# Patient Record
Sex: Female | Born: 1980
Health system: Southern US, Community
[De-identification: ages and names within clinical notes are randomized; demographics above are authoritative.]

## PROBLEM LIST (undated history)

## (undated) DIAGNOSIS — R109 Unspecified abdominal pain: Secondary | ICD-10-CM

## (undated) DIAGNOSIS — K219 Gastro-esophageal reflux disease without esophagitis: Secondary | ICD-10-CM

## (undated) DIAGNOSIS — M549 Dorsalgia, unspecified: Secondary | ICD-10-CM

## (undated) DIAGNOSIS — F17200 Nicotine dependence, unspecified, uncomplicated: Secondary | ICD-10-CM

## (undated) DIAGNOSIS — L659 Nonscarring hair loss, unspecified: Secondary | ICD-10-CM

## (undated) DIAGNOSIS — R112 Nausea with vomiting, unspecified: Secondary | ICD-10-CM

## (undated) DIAGNOSIS — K279 Peptic ulcer, site unspecified, unspecified as acute or chronic, without hemorrhage or perforation: Secondary | ICD-10-CM

## (undated) DIAGNOSIS — G8929 Other chronic pain: Secondary | ICD-10-CM

## (undated) DIAGNOSIS — G43909 Migraine, unspecified, not intractable, without status migrainosus: Secondary | ICD-10-CM

## (undated) DIAGNOSIS — I10 Essential (primary) hypertension: Secondary | ICD-10-CM

## (undated) DIAGNOSIS — J302 Other seasonal allergic rhinitis: Secondary | ICD-10-CM

## (undated) DIAGNOSIS — K313 Pylorospasm, not elsewhere classified: Secondary | ICD-10-CM

## (undated) DIAGNOSIS — L729 Follicular cyst of the skin and subcutaneous tissue, unspecified: Secondary | ICD-10-CM

## (undated) DIAGNOSIS — J329 Chronic sinusitis, unspecified: Secondary | ICD-10-CM

## (undated) DIAGNOSIS — F1910 Other psychoactive substance abuse, uncomplicated: Secondary | ICD-10-CM

## (undated) DIAGNOSIS — K2970 Gastritis, unspecified, without bleeding: Secondary | ICD-10-CM

## (undated) DIAGNOSIS — J40 Bronchitis, not specified as acute or chronic: Secondary | ICD-10-CM

## (undated) DIAGNOSIS — K3184 Gastroparesis: Secondary | ICD-10-CM

## (undated) DIAGNOSIS — K311 Adult hypertrophic pyloric stenosis: Secondary | ICD-10-CM

## (undated) DIAGNOSIS — D638 Anemia in other chronic diseases classified elsewhere: Secondary | ICD-10-CM

## (undated) DIAGNOSIS — F329 Major depressive disorder, single episode, unspecified: Secondary | ICD-10-CM

## (undated) DIAGNOSIS — R87629 Unspecified abnormal cytological findings in specimens from vagina: Secondary | ICD-10-CM

## (undated) DIAGNOSIS — J449 Chronic obstructive pulmonary disease, unspecified: Secondary | ICD-10-CM

## (undated) HISTORY — PX: CHOLECYSTECTOMY: SHX55

## (undated) HISTORY — DX: Nonscarring hair loss, unspecified: L65.9

## (undated) HISTORY — PX: OTHER SURGICAL HISTORY: SHX169

## (undated) HISTORY — DX: Bronchitis, not specified as acute or chronic: J40

## (undated) HISTORY — PX: CARPAL TUNNEL RELEASE: SHX101

## (undated) HISTORY — DX: Migraine, unspecified, not intractable, without status migrainosus: G43.909

## (undated) HISTORY — DX: Gastro-esophageal reflux disease without esophagitis: K21.9

## (undated) HISTORY — DX: Unspecified abnormal cytological findings in specimens from vagina: R87.629

## (undated) HISTORY — DX: Gastroparesis: K31.84

## (undated) HISTORY — DX: Adult hypertrophic pyloric stenosis: K31.1

## (undated) HISTORY — DX: Follicular cyst of the skin and subcutaneous tissue, unspecified: L72.9

## (undated) HISTORY — PX: TOE SURGERY: SHX1073

## (undated) HISTORY — DX: Other chronic pain: G89.29

## (undated) HISTORY — DX: Dorsalgia, unspecified: M54.9

## (undated) HISTORY — DX: Other psychoactive substance abuse, uncomplicated: F19.10

## (undated) HISTORY — DX: Nicotine dependence, unspecified, uncomplicated: F17.200

## (undated) HISTORY — DX: Gastritis, unspecified, without bleeding: K29.70

## (undated) HISTORY — DX: Chronic sinusitis, unspecified: J32.9

## (undated) HISTORY — DX: Major depressive disorder, single episode, unspecified: F32.9

## (undated) HISTORY — DX: Anemia in other chronic diseases classified elsewhere: D63.8

## (undated) HISTORY — DX: Other seasonal allergic rhinitis: J30.2

---

## 1998-01-19 DIAGNOSIS — F32A Depression, unspecified: Secondary | ICD-10-CM

## 1998-01-19 DIAGNOSIS — F329 Major depressive disorder, single episode, unspecified: Secondary | ICD-10-CM

## 1998-01-19 HISTORY — DX: Major depressive disorder, single episode, unspecified: F32.9

## 1998-01-19 HISTORY — DX: Depression, unspecified: F32.A

## 2000-01-20 HISTORY — PX: LAPAROSCOPIC CHOLECYSTECTOMY: SUR755

## 2000-07-06 ENCOUNTER — Emergency Department (HOSPITAL_COMMUNITY): Admission: EM | Admit: 2000-07-06 | Discharge: 2000-07-06 | Payer: Self-pay | Admitting: *Deleted

## 2000-07-06 ENCOUNTER — Encounter: Payer: Self-pay | Admitting: *Deleted

## 2000-10-06 ENCOUNTER — Other Ambulatory Visit: Admission: RE | Admit: 2000-10-06 | Discharge: 2000-10-06 | Payer: Self-pay | Admitting: Obstetrics and Gynecology

## 2000-10-28 ENCOUNTER — Other Ambulatory Visit: Admission: RE | Admit: 2000-10-28 | Discharge: 2000-10-28 | Payer: Self-pay | Admitting: Obstetrics and Gynecology

## 2000-11-05 ENCOUNTER — Encounter: Payer: Self-pay | Admitting: Family Medicine

## 2000-11-05 ENCOUNTER — Ambulatory Visit (HOSPITAL_COMMUNITY): Admission: RE | Admit: 2000-11-05 | Discharge: 2000-11-05 | Payer: Self-pay | Admitting: Family Medicine

## 2001-05-10 ENCOUNTER — Encounter: Payer: Self-pay | Admitting: Family Medicine

## 2001-05-10 ENCOUNTER — Ambulatory Visit (HOSPITAL_COMMUNITY): Admission: RE | Admit: 2001-05-10 | Discharge: 2001-05-10 | Payer: Self-pay | Admitting: Family Medicine

## 2003-03-28 ENCOUNTER — Other Ambulatory Visit: Admission: RE | Admit: 2003-03-28 | Discharge: 2003-03-28 | Payer: Self-pay | Admitting: Obstetrics and Gynecology

## 2003-04-12 ENCOUNTER — Ambulatory Visit (HOSPITAL_COMMUNITY): Admission: RE | Admit: 2003-04-12 | Discharge: 2003-04-12 | Payer: Self-pay | Admitting: Obstetrics & Gynecology

## 2003-08-14 ENCOUNTER — Ambulatory Visit (HOSPITAL_COMMUNITY): Admission: RE | Admit: 2003-08-14 | Discharge: 2003-08-14 | Payer: Self-pay | Admitting: Family Medicine

## 2004-06-02 ENCOUNTER — Ambulatory Visit: Payer: Self-pay | Admitting: Family Medicine

## 2004-06-06 ENCOUNTER — Ambulatory Visit (HOSPITAL_COMMUNITY): Admission: RE | Admit: 2004-06-06 | Discharge: 2004-06-06 | Payer: Self-pay | Admitting: Family Medicine

## 2004-07-08 ENCOUNTER — Ambulatory Visit: Payer: Self-pay | Admitting: Family Medicine

## 2004-09-30 ENCOUNTER — Ambulatory Visit: Payer: Self-pay | Admitting: Family Medicine

## 2005-01-01 ENCOUNTER — Ambulatory Visit: Payer: Self-pay | Admitting: Family Medicine

## 2005-01-04 ENCOUNTER — Emergency Department (HOSPITAL_COMMUNITY): Admission: EM | Admit: 2005-01-04 | Discharge: 2005-01-04 | Payer: Self-pay | Admitting: Emergency Medicine

## 2005-02-12 ENCOUNTER — Ambulatory Visit: Payer: Self-pay | Admitting: Family Medicine

## 2005-06-02 ENCOUNTER — Ambulatory Visit: Payer: Self-pay | Admitting: Family Medicine

## 2005-06-11 ENCOUNTER — Ambulatory Visit: Payer: Self-pay | Admitting: Family Medicine

## 2005-06-25 ENCOUNTER — Ambulatory Visit: Payer: Self-pay | Admitting: Family Medicine

## 2005-08-03 ENCOUNTER — Ambulatory Visit (HOSPITAL_COMMUNITY): Admission: RE | Admit: 2005-08-03 | Discharge: 2005-08-03 | Payer: Self-pay | Admitting: Family Medicine

## 2005-08-17 ENCOUNTER — Ambulatory Visit: Payer: Self-pay | Admitting: Family Medicine

## 2005-08-19 ENCOUNTER — Ambulatory Visit (HOSPITAL_COMMUNITY): Admission: RE | Admit: 2005-08-19 | Discharge: 2005-08-19 | Payer: Self-pay | Admitting: Family Medicine

## 2005-09-22 ENCOUNTER — Emergency Department (HOSPITAL_COMMUNITY): Admission: EM | Admit: 2005-09-22 | Discharge: 2005-09-22 | Payer: Self-pay | Admitting: Emergency Medicine

## 2005-10-01 ENCOUNTER — Emergency Department (HOSPITAL_COMMUNITY): Admission: EM | Admit: 2005-10-01 | Discharge: 2005-10-01 | Payer: Self-pay | Admitting: Emergency Medicine

## 2005-11-17 ENCOUNTER — Ambulatory Visit: Payer: Self-pay | Admitting: Family Medicine

## 2005-12-03 ENCOUNTER — Ambulatory Visit: Payer: Self-pay | Admitting: Orthopedic Surgery

## 2006-01-06 ENCOUNTER — Ambulatory Visit: Payer: Self-pay | Admitting: Family Medicine

## 2006-01-19 DIAGNOSIS — F1910 Other psychoactive substance abuse, uncomplicated: Secondary | ICD-10-CM

## 2006-01-19 HISTORY — DX: Other psychoactive substance abuse, uncomplicated: F19.10

## 2006-01-21 ENCOUNTER — Ambulatory Visit: Payer: Self-pay | Admitting: Orthopedic Surgery

## 2006-01-29 ENCOUNTER — Encounter (INDEPENDENT_AMBULATORY_CARE_PROVIDER_SITE_OTHER): Payer: Self-pay | Admitting: Specialist

## 2006-01-29 ENCOUNTER — Ambulatory Visit (HOSPITAL_COMMUNITY): Admission: RE | Admit: 2006-01-29 | Discharge: 2006-01-29 | Payer: Self-pay | Admitting: Orthopedic Surgery

## 2006-01-29 ENCOUNTER — Ambulatory Visit: Payer: Self-pay | Admitting: Orthopedic Surgery

## 2006-02-01 ENCOUNTER — Ambulatory Visit: Payer: Self-pay | Admitting: Orthopedic Surgery

## 2006-02-09 ENCOUNTER — Ambulatory Visit: Payer: Self-pay | Admitting: Orthopedic Surgery

## 2006-03-11 ENCOUNTER — Ambulatory Visit: Payer: Self-pay | Admitting: Orthopedic Surgery

## 2006-03-22 ENCOUNTER — Ambulatory Visit: Payer: Self-pay | Admitting: Family Medicine

## 2006-03-23 ENCOUNTER — Encounter: Payer: Self-pay | Admitting: Family Medicine

## 2006-03-30 ENCOUNTER — Ambulatory Visit: Payer: Self-pay | Admitting: Orthopedic Surgery

## 2006-04-06 ENCOUNTER — Encounter (HOSPITAL_COMMUNITY): Admission: RE | Admit: 2006-04-06 | Discharge: 2006-05-06 | Payer: Self-pay | Admitting: Orthopedic Surgery

## 2006-05-07 ENCOUNTER — Encounter (HOSPITAL_COMMUNITY): Admission: RE | Admit: 2006-05-07 | Discharge: 2006-06-06 | Payer: Self-pay | Admitting: Orthopedic Surgery

## 2006-05-11 ENCOUNTER — Ambulatory Visit: Payer: Self-pay | Admitting: Orthopedic Surgery

## 2006-06-08 ENCOUNTER — Ambulatory Visit: Payer: Self-pay | Admitting: Orthopedic Surgery

## 2006-07-21 ENCOUNTER — Emergency Department (HOSPITAL_COMMUNITY): Admission: EM | Admit: 2006-07-21 | Discharge: 2006-07-21 | Payer: Self-pay | Admitting: Emergency Medicine

## 2006-08-08 ENCOUNTER — Emergency Department (HOSPITAL_COMMUNITY): Admission: EM | Admit: 2006-08-08 | Discharge: 2006-08-08 | Payer: Self-pay | Admitting: Emergency Medicine

## 2006-08-09 ENCOUNTER — Ambulatory Visit (HOSPITAL_COMMUNITY): Admission: RE | Admit: 2006-08-09 | Discharge: 2006-08-09 | Payer: Self-pay | Admitting: Emergency Medicine

## 2006-08-12 ENCOUNTER — Ambulatory Visit: Payer: Self-pay | Admitting: Family Medicine

## 2006-08-13 ENCOUNTER — Ambulatory Visit (HOSPITAL_COMMUNITY): Admission: RE | Admit: 2006-08-13 | Discharge: 2006-08-13 | Payer: Self-pay | Admitting: Family Medicine

## 2006-08-19 ENCOUNTER — Encounter (HOSPITAL_COMMUNITY): Admission: RE | Admit: 2006-08-19 | Discharge: 2006-09-18 | Payer: Self-pay | Admitting: Orthopaedic Surgery

## 2006-09-22 ENCOUNTER — Ambulatory Visit: Payer: Self-pay | Admitting: Family Medicine

## 2006-11-29 ENCOUNTER — Encounter: Payer: Self-pay | Admitting: Family Medicine

## 2006-11-29 LAB — CONVERTED CEMR LAB
BUN: 19 mg/dL (ref 6–23)
Calcium: 9.8 mg/dL (ref 8.4–10.5)
Cholesterol: 182 mg/dL (ref 0–200)
Creatinine, Ser: 0.92 mg/dL (ref 0.40–1.20)
Glucose, Bld: 82 mg/dL (ref 70–99)
Hemoglobin: 11.8 g/dL — ABNORMAL LOW (ref 12.0–15.0)
LDL Cholesterol: 132 mg/dL — ABNORMAL HIGH (ref 0–99)
Lymphocytes Relative: 38 % (ref 12–46)
Lymphs Abs: 2.4 10*3/uL (ref 0.7–4.0)
MCHC: 31.8 g/dL (ref 30.0–36.0)
Monocytes Relative: 8 % (ref 3–12)
Neutro Abs: 3.4 10*3/uL (ref 1.7–7.7)
Neutrophils Relative %: 53 % (ref 43–77)
Platelets: 166 10*3/uL (ref 150–400)
Potassium: 4.2 meq/L (ref 3.5–5.3)
RBC: 4.11 M/uL (ref 3.87–5.11)
RDW: 13.8 % (ref 11.5–15.5)
Sodium: 144 meq/L (ref 135–145)
TSH: 1.068 microintl units/mL (ref 0.350–5.50)
Total CHOL/HDL Ratio: 5.4
Triglycerides: 81 mg/dL (ref <150)
VLDL: 16 mg/dL (ref 0–40)

## 2006-11-30 ENCOUNTER — Other Ambulatory Visit: Admission: RE | Admit: 2006-11-30 | Discharge: 2006-11-30 | Payer: Self-pay | Admitting: Obstetrics and Gynecology

## 2006-12-01 ENCOUNTER — Ambulatory Visit (HOSPITAL_COMMUNITY): Admission: RE | Admit: 2006-12-01 | Discharge: 2006-12-01 | Payer: Self-pay | Admitting: Obstetrics and Gynecology

## 2007-01-10 ENCOUNTER — Emergency Department (HOSPITAL_COMMUNITY): Admission: EM | Admit: 2007-01-10 | Discharge: 2007-01-10 | Payer: Self-pay | Admitting: Emergency Medicine

## 2007-01-26 DIAGNOSIS — J45909 Unspecified asthma, uncomplicated: Secondary | ICD-10-CM | POA: Insufficient documentation

## 2007-01-27 ENCOUNTER — Ambulatory Visit: Payer: Self-pay | Admitting: Orthopedic Surgery

## 2007-01-27 DIAGNOSIS — S92309A Fracture of unspecified metatarsal bone(s), unspecified foot, initial encounter for closed fracture: Secondary | ICD-10-CM | POA: Insufficient documentation

## 2007-01-27 DIAGNOSIS — M79609 Pain in unspecified limb: Secondary | ICD-10-CM | POA: Insufficient documentation

## 2007-02-16 ENCOUNTER — Ambulatory Visit: Payer: Self-pay | Admitting: Family Medicine

## 2007-02-22 ENCOUNTER — Telehealth: Payer: Self-pay | Admitting: Orthopedic Surgery

## 2007-03-10 ENCOUNTER — Encounter (HOSPITAL_COMMUNITY): Admission: RE | Admit: 2007-03-10 | Discharge: 2007-04-09 | Payer: Self-pay | Admitting: Orthopaedic Surgery

## 2007-04-12 ENCOUNTER — Encounter (HOSPITAL_COMMUNITY): Admission: RE | Admit: 2007-04-12 | Discharge: 2007-05-12 | Payer: Self-pay | Admitting: Orthopaedic Surgery

## 2007-05-16 ENCOUNTER — Emergency Department (HOSPITAL_COMMUNITY): Admission: EM | Admit: 2007-05-16 | Discharge: 2007-05-16 | Payer: Self-pay | Admitting: Emergency Medicine

## 2007-06-20 ENCOUNTER — Ambulatory Visit: Payer: Self-pay | Admitting: Orthopedic Surgery

## 2007-06-21 ENCOUNTER — Ambulatory Visit: Payer: Self-pay | Admitting: Family Medicine

## 2007-06-22 ENCOUNTER — Telehealth: Payer: Self-pay | Admitting: Orthopedic Surgery

## 2007-07-01 DIAGNOSIS — F329 Major depressive disorder, single episode, unspecified: Secondary | ICD-10-CM | POA: Insufficient documentation

## 2007-07-01 DIAGNOSIS — M549 Dorsalgia, unspecified: Secondary | ICD-10-CM | POA: Insufficient documentation

## 2007-07-04 ENCOUNTER — Telehealth: Payer: Self-pay | Admitting: Orthopedic Surgery

## 2007-07-06 ENCOUNTER — Emergency Department (HOSPITAL_COMMUNITY): Admission: EM | Admit: 2007-07-06 | Discharge: 2007-07-06 | Payer: Self-pay | Admitting: Emergency Medicine

## 2007-07-08 ENCOUNTER — Emergency Department (HOSPITAL_COMMUNITY): Admission: EM | Admit: 2007-07-08 | Discharge: 2007-07-08 | Payer: Self-pay | Admitting: Emergency Medicine

## 2007-07-08 ENCOUNTER — Ambulatory Visit: Payer: Self-pay | Admitting: Family Medicine

## 2007-07-20 ENCOUNTER — Ambulatory Visit: Payer: Self-pay | Admitting: Orthopedic Surgery

## 2007-07-26 ENCOUNTER — Telehealth: Payer: Self-pay | Admitting: Orthopedic Surgery

## 2007-07-28 ENCOUNTER — Encounter: Payer: Self-pay | Admitting: Orthopedic Surgery

## 2007-08-01 ENCOUNTER — Ambulatory Visit (HOSPITAL_COMMUNITY): Admission: RE | Admit: 2007-08-01 | Discharge: 2007-08-01 | Payer: Self-pay | Admitting: Orthopedic Surgery

## 2007-08-08 ENCOUNTER — Ambulatory Visit: Payer: Self-pay | Admitting: Orthopedic Surgery

## 2007-09-16 ENCOUNTER — Encounter: Payer: Self-pay | Admitting: Orthopedic Surgery

## 2007-09-21 ENCOUNTER — Ambulatory Visit: Payer: Self-pay | Admitting: Family Medicine

## 2007-09-21 ENCOUNTER — Ambulatory Visit (HOSPITAL_COMMUNITY): Admission: RE | Admit: 2007-09-21 | Discharge: 2007-09-21 | Payer: Self-pay | Admitting: Family Medicine

## 2007-09-21 DIAGNOSIS — J209 Acute bronchitis, unspecified: Secondary | ICD-10-CM | POA: Insufficient documentation

## 2007-09-27 DIAGNOSIS — N912 Amenorrhea, unspecified: Secondary | ICD-10-CM | POA: Insufficient documentation

## 2007-09-27 DIAGNOSIS — L259 Unspecified contact dermatitis, unspecified cause: Secondary | ICD-10-CM | POA: Insufficient documentation

## 2007-11-11 ENCOUNTER — Encounter: Payer: Self-pay | Admitting: Family Medicine

## 2008-03-01 ENCOUNTER — Other Ambulatory Visit: Admission: RE | Admit: 2008-03-01 | Discharge: 2008-03-01 | Payer: Self-pay | Admitting: Obstetrics and Gynecology

## 2008-03-02 ENCOUNTER — Encounter: Payer: Self-pay | Admitting: Family Medicine

## 2008-03-13 ENCOUNTER — Ambulatory Visit: Payer: Self-pay | Admitting: Family Medicine

## 2008-03-13 DIAGNOSIS — R519 Headache, unspecified: Secondary | ICD-10-CM | POA: Insufficient documentation

## 2008-03-13 DIAGNOSIS — R51 Headache: Secondary | ICD-10-CM

## 2008-03-13 DIAGNOSIS — R21 Rash and other nonspecific skin eruption: Secondary | ICD-10-CM | POA: Insufficient documentation

## 2008-03-13 LAB — CONVERTED CEMR LAB
BUN: 12 mg/dL (ref 6–23)
Basophils Absolute: 0 10*3/uL (ref 0.0–0.1)
CO2: 24 meq/L (ref 19–32)
Eosinophils Absolute: 0.1 10*3/uL (ref 0.0–0.7)
Eosinophils Relative: 1 % (ref 0–5)
Glucose, Bld: 72 mg/dL (ref 70–99)
HCT: 35 % — ABNORMAL LOW (ref 36.0–46.0)
Lymphocytes Relative: 30 % (ref 12–46)
Monocytes Relative: 6 % (ref 3–12)
Neutro Abs: 6 10*3/uL (ref 1.7–7.7)
Neutrophils Relative %: 64 % (ref 43–77)
Platelets: 146 10*3/uL — ABNORMAL LOW (ref 150–400)
Potassium: 3.9 meq/L (ref 3.5–5.3)

## 2008-04-05 ENCOUNTER — Emergency Department (HOSPITAL_COMMUNITY): Admission: EM | Admit: 2008-04-05 | Discharge: 2008-04-05 | Payer: Self-pay | Admitting: Emergency Medicine

## 2008-04-05 ENCOUNTER — Encounter: Payer: Self-pay | Admitting: Family Medicine

## 2008-04-10 ENCOUNTER — Ambulatory Visit: Payer: Self-pay | Admitting: Family Medicine

## 2008-07-05 ENCOUNTER — Emergency Department (HOSPITAL_COMMUNITY): Admission: EM | Admit: 2008-07-05 | Discharge: 2008-07-05 | Payer: Self-pay | Admitting: Emergency Medicine

## 2008-07-11 ENCOUNTER — Ambulatory Visit: Payer: Self-pay | Admitting: Family Medicine

## 2008-07-11 DIAGNOSIS — R109 Unspecified abdominal pain: Secondary | ICD-10-CM | POA: Insufficient documentation

## 2008-07-11 LAB — CONVERTED CEMR LAB: Helicobacter Pylori Antibody-IgG: 0.5

## 2008-10-26 ENCOUNTER — Other Ambulatory Visit: Admission: RE | Admit: 2008-10-26 | Discharge: 2008-10-26 | Payer: Self-pay | Admitting: Obstetrics & Gynecology

## 2008-11-21 ENCOUNTER — Ambulatory Visit: Payer: Self-pay | Admitting: Family Medicine

## 2008-11-21 DIAGNOSIS — R5383 Other fatigue: Secondary | ICD-10-CM

## 2008-11-21 DIAGNOSIS — R5381 Other malaise: Secondary | ICD-10-CM | POA: Insufficient documentation

## 2008-11-21 DIAGNOSIS — N3000 Acute cystitis without hematuria: Secondary | ICD-10-CM | POA: Insufficient documentation

## 2008-11-21 LAB — CONVERTED CEMR LAB
Glucose, Urine, Semiquant: NEGATIVE
Ketones, urine, test strip: NEGATIVE
Protein, U semiquant: NEGATIVE

## 2008-11-27 ENCOUNTER — Telehealth: Payer: Self-pay | Admitting: Family Medicine

## 2008-12-14 ENCOUNTER — Emergency Department (HOSPITAL_COMMUNITY): Admission: EM | Admit: 2008-12-14 | Discharge: 2008-12-14 | Payer: Self-pay | Admitting: Emergency Medicine

## 2008-12-15 ENCOUNTER — Emergency Department (HOSPITAL_COMMUNITY): Admission: EM | Admit: 2008-12-15 | Discharge: 2008-12-15 | Payer: Self-pay | Admitting: Emergency Medicine

## 2009-02-08 ENCOUNTER — Emergency Department (HOSPITAL_COMMUNITY): Admission: EM | Admit: 2009-02-08 | Discharge: 2009-02-08 | Payer: Self-pay | Admitting: Advanced Practice Midwife

## 2009-04-01 ENCOUNTER — Encounter: Admission: RE | Admit: 2009-04-01 | Discharge: 2009-05-14 | Payer: Self-pay | Admitting: Orthopaedic Surgery

## 2009-06-04 ENCOUNTER — Ambulatory Visit: Payer: Self-pay | Admitting: Family Medicine

## 2009-06-04 DIAGNOSIS — K5900 Constipation, unspecified: Secondary | ICD-10-CM | POA: Insufficient documentation

## 2009-06-05 ENCOUNTER — Encounter: Payer: Self-pay | Admitting: Family Medicine

## 2009-06-06 LAB — CONVERTED CEMR LAB
Albumin: 5.4 g/dL — ABNORMAL HIGH (ref 3.5–5.2)
BUN: 15 mg/dL (ref 6–23)
Basophils Absolute: 0 10*3/uL (ref 0.0–0.1)
Basophils Relative: 0 % (ref 0–1)
Bilirubin, Direct: 0.1 mg/dL (ref 0.0–0.3)
Calcium: 10.4 mg/dL (ref 8.4–10.5)
Cholesterol: 173 mg/dL (ref 0–200)
Eosinophils Absolute: 0.1 10*3/uL (ref 0.0–0.7)
HDL: 45 mg/dL (ref >39)
Helicobacter Pylori Antibody-IgG: 0.4
Hemoglobin: 12.2 g/dL (ref 12.0–15.0)
Lymphocytes Relative: 37 % (ref 12–46)
Lymphs Abs: 2.6 10*3/uL (ref 0.7–4.0)
MCHC: 32.4 g/dL (ref 30.0–36.0)
Monocytes Relative: 7 % (ref 3–12)
RBC: 4.24 M/uL (ref 3.87–5.11)
RDW: 13.1 % (ref 11.5–15.5)
Sodium: 140 meq/L (ref 135–145)
TSH: 1.357 microintl units/mL (ref 0.350–4.500)
Total Bilirubin: 0.4 mg/dL (ref 0.3–1.2)
Total Protein: 7.9 g/dL (ref 6.0–8.3)
Triglycerides: 82 mg/dL (ref <150)
Vit D, 25-Hydroxy: 18 ng/mL — ABNORMAL LOW (ref 30–89)
WBC: 7.1 10*3/uL (ref 4.0–10.5)

## 2009-07-17 ENCOUNTER — Ambulatory Visit: Payer: Self-pay | Admitting: Gastroenterology

## 2009-07-18 ENCOUNTER — Encounter: Payer: Self-pay | Admitting: Internal Medicine

## 2009-07-18 ENCOUNTER — Encounter: Payer: Self-pay | Admitting: Gastroenterology

## 2009-08-05 ENCOUNTER — Telehealth (INDEPENDENT_AMBULATORY_CARE_PROVIDER_SITE_OTHER): Payer: Self-pay | Admitting: *Deleted

## 2009-10-06 ENCOUNTER — Emergency Department (HOSPITAL_COMMUNITY): Admission: EM | Admit: 2009-10-06 | Discharge: 2009-10-06 | Payer: Self-pay | Admitting: Emergency Medicine

## 2010-01-13 ENCOUNTER — Emergency Department (HOSPITAL_COMMUNITY)
Admission: EM | Admit: 2010-01-13 | Discharge: 2010-01-13 | Payer: Self-pay | Source: Home / Self Care | Admitting: Emergency Medicine

## 2010-02-18 NOTE — Assessment & Plan Note (Signed)
Summary: CONSTIPATION/SS   Visit Type:  consult Referring Provider:  Syliva Daniel Primary Care Provider:  Syliva Daniel  Chief Complaint:  constipation.  History of Present Illness: Tracey Daniel is a pleasant 30 y/o female, patient of Dr. Lodema Daniel, who presents with worsening constipation. She has had problems for years but now she goes extremely long periords of times without BM. She states she went three months without a single BM. The month of June, she had her first [facility] on June 26th. She has tried Exlax, Dulcolax, Miralax with little relief. No melena, brbpr. She has anorexia the longer she goes without BM. Also develops left sided abdominal pain. She reports weight loss of 15 pounds. No documented weight loss at least by weights recorded through Dr. Anthony Daniel office. Recent labs showed normal CBC, TSH, LFTs, Cre. H. Pylori serologies were negative.   Current Medications (verified): 1)  Vitamin D (Ergocalciferol) 50000 Unit Caps (Ergocalciferol) .... One Capsule Once Weekly 2)  Proventil Hfa 108 (90 Base) Mcg/act Aers (Albuterol Sulfate) .... Once Daily 3)  Estradiol-Norethindrone Acet 1-0.5 Mg Tabs (Estradiol-Norethindrone Acet) .... Once Daily 4)  Hydrocodone-Acetaminophen 7.5-750 Mg Tabs (Hydrocodone-Acetaminophen) .... As Needed (Usually Two Per Day) 5)  Seroquel 300 Mg Tabs (Quetiapine Fumarate) .... Once Daily 6)  Cymbalta 60 Mg Cpep (Duloxetine Hcl) .... Once Daily  Allergies (verified): 1)  ! Pcn  Past History:  Past Medical History:   BACK PAIN, CHRONIC (ICD-724.5) NICOTINE ADDICTION (ICD-305.1) DEPRESSION (ICD-311) FOOT PAIN, LEFT (ICD-729.5) CLOSED FRACTURE OF METATARSAL BONE (ICD-825.25) ASTHMA (ICD-493.90) Constipation Vit D deficiency Early menopause, on hormone replacement therapy  Past Surgical History: Cholecystectomy Laser surgery on cervix (2005) Left carpel tunnel release and cyst removal, Tracey Daniel  Family History: Father: asthma Mother: shot  by her boyfriend at age 79 in 5 Siblings: two sisters one with depression No FH of CRC.  Social History: Reviewed history from 07/01/2007 and no changes required. Marital Status: single Children: no Occupation: disabled  Current Smoker Alcohol use-no Drug use-no  Review of Systems General:  Denies fever, chills, sweats, anorexia, weakness, and weight loss. Eyes:  Denies vision loss. ENT:  Denies nasal congestion, sore throat, hoarseness, and difficulty swallowing. CV:  Denies chest pains, angina, palpitations, dyspnea on exertion, and peripheral edema. Resp:  Denies dyspnea at rest, dyspnea with exercise, cough, sputum, and wheezing. GI:  See HPI. GU:  Denies urinary burning and blood in urine. MS:  Denies joint pain / LOM. Derm:  Denies rash and itching. Neuro:  Denies weakness, frequent headaches, memory loss, and confusion. Psych:  Complains of depression; denies anxiety and suicidal ideation. Endo:  Denies unusual weight change. Heme:  Denies bruising and bleeding. Allergy:  Denies hives and rash.  Vital Signs:  Patient profile:   30 year old female Menstrual status:  postmenopausal Height:      66 inches Weight:      140.5 pounds BMI:     22.76 Temp:     98.5 degrees F oral Pulse rate:   60 / minute BP sitting:   110 / 80  (left arm) Cuff size:   regular  Vitals Entered By: Tracey Daniel (July 17, 2009 2:46 PM)  Physical Exam  General:  Well developed, well nourished, no acute distress. Head:  Normocephalic and atraumatic. Eyes:  Conjunctivae pink, no scleral icterus.  Mouth:  Oropharyngeal mucosa moist, pink.  No lesions, erythema or exudate.    Neck:  Supple; no masses or thyromegaly. Lungs:  Clear throughout to auscultation. Heart:  Regular rate and rhythm; no murmurs, rubs,  or bruits. Abdomen:  Mild LLQ tenderness. Normal BS. No HSM or masses. No abd bruit or hernia. No rebound or guarding. Rectal:  deferred until time of colonoscopy.     Extremities:  No clubbing, cyanosis, edema or deformities noted. Neurologic:  Alert and  oriented x4;  grossly normal neurologically. Skin:  Intact without significant lesions or rashes. Cervical Nodes:  No significant cervical adenopathy. Psych:  Alert and cooperative. Normal mood and affect.  Impression & Recommendations:  Problem # 1:  CONSTIPATION (ICD-564.00)  Severe constipation. Start Amitiza 24 micrograms by mouth two times a day with food. #20 samples and RX given. Colonoscopy to be performed in near future.  Risks, alternatives, and benefits including but not limited to the risk of reaction to medication, bleeding, infection, and perforation were addressed.  Patient voiced understanding and provided verbal consent. Procedure may need to be done in OR due to polypharmacy. Will address with Tracey Daniel. Two day prep given severe constipation.   Orders: Consultation Level III (95188) Prescriptions: AMITIZA 24 MCG CAPS (LUBIPROSTONE) one by mouth two times a day with food  #60 x 3   Entered and Authorized by:   Tracey Battles. Dixon Boos   Signed by:   Tracey Battles [name] PA-C on 07/17/2009   Method used:   Print then Give to Patient   RxID:   [id number]  I would like to thank Dr. Lodema Daniel for allowing [place] to take part in the care of this nice patient.  Appended Document: CONSTIPATION/SS Disc with RMR. Procedure in OR as planned. Please let pt know to procede with pre-op appt as scheduled.  Appended Document: CONSTIPATION/SS pt aware

## 2010-02-18 NOTE — Progress Notes (Signed)
Summary: cancelled procedure  Phone Note From Other Clinic   Summary of Call: kim from short stay called to let us know the pt has cancelled her procedure and thats why she missed her pre op visit. Pt is having transportation problems. Initial call taken by: Diana Eves,  August 05, 2009 1:45 PM     Appended Document: cancelled procedure pt will [facility] at a later time once she works out her transportation issue.

## 2010-02-18 NOTE — Letter (Signed)
Summary: Internal Other  Internal Other   Imported By: Peggyann Shoals 07/18/2009 14:17:29  _____________________________________________________________________  External Attachment:    Type:   Image     Comment:   External Document

## 2010-02-18 NOTE — Letter (Signed)
Summary: Internal Other /procedure order  Internal Other /procedure order   Imported By: Cloria Spring LPN 47/82/9562 13:08:65  _____________________________________________________________________  External Attachment:    Type:   Image     Comment:   External Document

## 2010-02-18 NOTE — Assessment & Plan Note (Signed)
Summary: f up   Vital Signs:  Patient profile:   30 year old female Menstrual status:  postmenopausal Height:      66 inches Weight:      136 pounds BMI:     22.03 O2 Sat:      98 % Pulse rate:   59 / minute Pulse rhythm:   regular Resp:     16 per minute BP sitting:   122 / 84  (left arm) Cuff size:   regular  Vitals Entered By: Everitt Amber LPN (Jun 04, 2009 11:24 AM) CC: she states that she will go for a few months without having a bowel movement and when she does go, she goes everyday for about 3-4 days and then she can't go anymore for a month or so. States this has been going for 6 years. She never brought it up before because she didn't think it was a problem. Now she has been having abdominal pains in the right side and losing weight   CC:  she states that she will go for a few months without having a bowel movement and when she does go and she goes everyday for about 3-4 days and then she can't go anymore for a month or so. States this has been going for 6 years. She never brought it up before because she didn't think it was a problem. Now she has been having abdominal pains in the right side and losing weight.  History of Present Illness: Poor apetite and poor sleep and not taking depression meds regularly simnnce Mom died and she is having family srtress x 1 yr.  reports 3 month h/o constipation , then Bm for 3 days stringy, watery She denies head oer chest congestion, ear pain  or uncontrolled allergies. She denies dysuria or frequency. She denies chest pain, palpitations, pND or orhtopnea.She has no leg swelling. she denies skin breakdown, discoloraTION OR ULCERATION.  Preventive Screening-Counseling & Management  Alcohol-Tobacco     Smoking Cessation Counseling: yes  Current Medications (verified): 1)  Ra Omeprazole 20 Mg Tbec (Omeprazole) .... Take 1 Tablet By Mouth Once A Day  Allergies (verified): 1)  ! Pcn  Review of Systems      See HPI GI:  Complains of  constipation; denies abdominal pain and bloody stools; chronic comstipoation, loss of weight. GU:  Complains of abnormal vaginal bleeding; pt has chronic amenorreah. Psych:  Complains of anxiety, depression, and mental problems; denies suicidal thoughts/plans, thoughts of violence, and unusual visions or sounds; not taling meds reguilarly, however she still is experiencing depression and grief from her mother's loss last year. Endo:  Denies excessive thirst and excessive urination. Heme:  Denies abnormal bruising and bleeding. Allergy:  Complains of seasonal allergies; mild.  Physical Exam  General:  Well-developed,well-nourished,in no acute distress; alert,appropriate and cooperative throughout examination HEENT: No facial asymmetry,  EOMI, No sinus tenderness, TM's Clear, oropharynx  pink and moist.   Chest: Clear to auscultation bilaterally.  CVS: S1, S2, No murmurs, No S3.   Abd: Soft, Nontender.  MS: Adequate ROM spine, hips, shoulders and knees.  Ext: No edema.   CNS: CN 2-12 intact, power tone and sensation normal throughout.   Skin: Intact, no visible lesions or rashes.  Psych: Good eye contact, normal affect.  Memory intact,  depressed appearing.     Impression & Recommendations:  Problem # 1:  CONSTIPATION (ICD-564.00) Assessment Deteriorated  Orders: Gastroenterology Referral (GI)  Problem # 2:  NICOTINE ADDICTION (ICD-305.1) Assessment:  Unchanged  Encouraged smoking cessation and discussed different methods for smoking cessation.   Problem # 3:  DEPRESSION (ICD-311) Assessment: Deteriorated pt encouraged and strongly advised to start rtaking her med for depression regularly and to make an appt with a therapist.  Problem # 4:  ASTHMA (ICD-493.90)  Complete Medication List: 1)  Ra Omeprazole 20 Mg Tbec (Omeprazole) .... Take 1 tablet by mouth once a day 2)  Vitamin D (ergocalciferol) 50000 Unit Caps (Ergocalciferol) .... One capsule once weekly  Other  Orders: T-Basic Metabolic Panel [phone]) T-Hepatic Function [id number]) T-Lipid Profile [phone]) T-CBC w/Diff [phone]) T-TSH [phone]) T-Vitamin D (25-Hydroxy) ([id number]) TLB-H. Pylori Abs(Helicobacter Pylori) ([id number])  Patient Instructions: 1)  Please schedule a follow-up appointment in 2 months. 2)  Tobacco is very bad for your health and your loved ones! You Should stop smoking!. 3)  Stop Smoking Tips: Choose a Quit date. Cut down before the Quit date. decide what you will do as a substitute when you feel the urge to smoke(gum,toothpick,exercise). 4)  BMP prior to visit, ICD-9: 5)  Hepatic Panel prior to visit, ICD-9: 6)  Lipid Panel prior to visit, ICD-9: 7)  TSH prior to visit, ICD-9:   fasting today 8)  CBC w/ Diff prior to visit, ICD-9: 9)  vitamin d 10)  H pylori 11)  you will be referred to a stomac h specialist Prescriptions: VITAMIN D (ERGOCALCIFEROL) 50000 UNIT CAPS (ERGOCALCIFEROL) one capsule once weekly  #4 x 4   Entered and Authorized by:   Syliva Overman MD   Signed by:   Syliva Overman MD on 06/05/2009   Method used:   Electronically to        [facility] [address] [phone]* (retail)       [address], [place]  [address]       Ph: [phone]       Fax: [phone]   RxID:   [id number]

## 2010-03-31 LAB — COMPREHENSIVE METABOLIC PANEL
ALT: 20 U/L (ref 0–35)
BUN: 14 mg/dL (ref 6–23)
Calcium: 9.8 mg/dL (ref 8.4–10.5)
Glucose, Bld: 109 mg/dL — ABNORMAL HIGH (ref 70–99)
Sodium: 140 mEq/L (ref 135–145)
Total Protein: 7.5 g/dL (ref 6.0–8.3)

## 2010-03-31 LAB — URINALYSIS, ROUTINE W REFLEX MICROSCOPIC
Glucose, UA: NEGATIVE mg/dL
Leukocytes, UA: NEGATIVE
pH: 8.5 — ABNORMAL HIGH (ref 5.0–8.0)

## 2010-03-31 LAB — CBC
HCT: 32.4 % — ABNORMAL LOW (ref 36.0–46.0)
MCHC: 33.3 g/dL (ref 30.0–36.0)
Platelets: 182 10*3/uL (ref 150–400)
RDW: 13.1 % (ref 11.5–15.5)

## 2010-03-31 LAB — DIFFERENTIAL
Lymphs Abs: 1.5 10*3/uL (ref 0.7–4.0)
Monocytes Relative: 4 % (ref 3–12)
Neutro Abs: 13.8 10*3/uL — ABNORMAL HIGH (ref 1.7–7.7)
Neutrophils Relative %: 86 % — ABNORMAL HIGH (ref 43–77)

## 2010-03-31 LAB — URINE MICROSCOPIC-ADD ON

## 2010-04-03 LAB — COMPREHENSIVE METABOLIC PANEL
ALT: 17 U/L (ref 0–35)
CO2: 28 mEq/L (ref 19–32)
Calcium: 9.6 mg/dL (ref 8.4–10.5)
GFR calc non Af Amer: >60 mL/min (ref >60)
Glucose, Bld: 86 mg/dL (ref 70–99)
Sodium: 141 mEq/L (ref 135–145)
Total Bilirubin: 0.6 mg/dL (ref 0.3–1.2)

## 2010-04-03 LAB — DIFFERENTIAL
Basophils Absolute: 0 10*3/uL (ref 0.0–0.1)
Basophils Relative: 0 % (ref 0–1)
Eosinophils Absolute: 0.1 10*3/uL (ref 0.0–0.7)
Lymphs Abs: 2.3 10*3/uL (ref 0.7–4.0)
Neutrophils Relative %: 67 % (ref 43–77)

## 2010-04-03 LAB — CBC
HCT: 35.4 % — ABNORMAL LOW (ref 36.0–46.0)
Hemoglobin: 11.8 g/dL — ABNORMAL LOW (ref 12.0–15.0)
MCH: 29 pg (ref 26.0–34.0)
MCHC: 33.3 g/dL (ref 30.0–36.0)

## 2010-04-03 LAB — LIPASE, BLOOD: Lipase: 28 U/L (ref 11–59)

## 2010-04-07 LAB — RAPID STREP SCREEN (MED CTR MEBANE ONLY): Streptococcus, Group A Screen (Direct): NEGATIVE

## 2010-04-15 ENCOUNTER — Other Ambulatory Visit (HOSPITAL_COMMUNITY)
Admission: RE | Admit: 2010-04-15 | Discharge: 2010-04-15 | Disposition: A | Discharge status: Home / Self Care | Payer: Medicaid Other | Source: Ambulatory Visit | Attending: Obstetrics & Gynecology | Admitting: Obstetrics & Gynecology

## 2010-04-15 ENCOUNTER — Other Ambulatory Visit: Payer: Self-pay | Admitting: Obstetrics & Gynecology

## 2010-04-15 DIAGNOSIS — Z01419 Encounter for gynecological examination (general) (routine) without abnormal findings: Secondary | ICD-10-CM | POA: Insufficient documentation

## 2010-04-23 LAB — DIFFERENTIAL
Basophils Absolute: 0 10*3/uL (ref 0.0–0.1)
Eosinophils Absolute: 0 10*3/uL (ref 0.0–0.7)
Eosinophils Relative: 1 % (ref 0–5)
Monocytes Absolute: 0.3 10*3/uL (ref 0.1–1.0)

## 2010-04-23 LAB — URINALYSIS, ROUTINE W REFLEX MICROSCOPIC
Bilirubin Urine: NEGATIVE
Bilirubin Urine: NEGATIVE
Hgb urine dipstick: NEGATIVE
Ketones, ur: NEGATIVE mg/dL
Ketones, ur: NEGATIVE mg/dL
Nitrite: NEGATIVE
Nitrite: NEGATIVE
Protein, ur: NEGATIVE mg/dL
Specific Gravity, Urine: 1.01 (ref 1.005–1.030)
Specific Gravity, Urine: 1.02 (ref 1.005–1.030)
Urobilinogen, UA: 0.2 mg/dL (ref 0.0–1.0)
pH: 6 (ref 5.0–8.0)

## 2010-04-23 LAB — WET PREP, GENITAL
WBC, Wet Prep HPF POC: NONE SEEN
Yeast Wet Prep HPF POC: NONE SEEN

## 2010-04-23 LAB — POCT I-STAT, CHEM 8
HCT: 33 % — ABNORMAL LOW (ref 36.0–46.0)
Hemoglobin: 11.2 g/dL — ABNORMAL LOW (ref 12.0–15.0)
Potassium: 3.6 mEq/L (ref 3.5–5.1)
Sodium: 142 mEq/L (ref 135–145)
TCO2: 24 mmol/L (ref 0–100)

## 2010-04-23 LAB — BASIC METABOLIC PANEL
BUN: 6 mg/dL (ref 6–23)
CO2: 29 mEq/L (ref 19–32)
Chloride: 105 mEq/L (ref 96–112)
Glucose, Bld: 93 mg/dL (ref 70–99)
Potassium: 3.2 mEq/L — ABNORMAL LOW (ref 3.5–5.1)
Sodium: 141 mEq/L (ref 135–145)

## 2010-04-23 LAB — CBC
HCT: 35.1 % — ABNORMAL LOW (ref 36.0–46.0)
Hemoglobin: 11.7 g/dL — ABNORMAL LOW (ref 12.0–15.0)
MCV: 90.7 fL (ref 78.0–100.0)
Platelets: 122 10*3/uL — ABNORMAL LOW (ref 150–400)
RDW: 13.4 % (ref 11.5–15.5)

## 2010-04-23 LAB — URINE CULTURE

## 2010-04-23 LAB — GC/CHLAMYDIA PROBE AMP, GENITAL
Chlamydia, DNA Probe: NEGATIVE
GC Probe Amp, Genital: NEGATIVE

## 2010-04-23 LAB — URINE MICROSCOPIC-ADD ON

## 2010-04-23 LAB — PREGNANCY, URINE: Preg Test, Ur: NEGATIVE

## 2010-04-28 LAB — URINALYSIS, ROUTINE W REFLEX MICROSCOPIC
Hgb urine dipstick: NEGATIVE
Nitrite: NEGATIVE
Protein, ur: NEGATIVE mg/dL
Specific Gravity, Urine: 1.015 (ref 1.005–1.030)
Urobilinogen, UA: 0.2 mg/dL (ref 0.0–1.0)

## 2010-04-28 LAB — BASIC METABOLIC PANEL
BUN: 18 mg/dL (ref 6–23)
CO2: 29 mEq/L (ref 19–32)
Calcium: 10.2 mg/dL (ref 8.4–10.5)
Chloride: 103 mEq/L (ref 96–112)
Creatinine, Ser: 0.92 mg/dL (ref 0.4–1.2)
GFR calc Af Amer: >60 mL/min (ref >60)
GFR calc non Af Amer: >60 mL/min (ref >60)
Glucose, Bld: 96 mg/dL (ref 70–99)
Potassium: 3.9 mEq/L (ref 3.5–5.1)
Sodium: 140 mEq/L (ref 135–145)

## 2010-05-01 LAB — URINALYSIS, ROUTINE W REFLEX MICROSCOPIC
Bilirubin Urine: NEGATIVE
Ketones, ur: >80 mg/dL — AB
Nitrite: NEGATIVE
Protein, ur: NEGATIVE mg/dL
Urobilinogen, UA: 0.2 mg/dL (ref 0.0–1.0)
pH: 6.5 (ref 5.0–8.0)

## 2010-05-01 LAB — COMPREHENSIVE METABOLIC PANEL
ALT: 15 U/L (ref 0–35)
ALT: 16 U/L (ref 0–35)
Alkaline Phosphatase: 69 U/L (ref 39–117)
BUN: 17 mg/dL (ref 6–23)
CO2: 24 mEq/L (ref 19–32)
Calcium: 10 mg/dL (ref 8.4–10.5)
GFR calc non Af Amer: >60 mL/min (ref >60)
Glucose, Bld: 133 mg/dL — ABNORMAL HIGH (ref 70–99)
Glucose, Bld: 170 mg/dL — ABNORMAL HIGH (ref 70–99)
Potassium: 2.9 mEq/L — ABNORMAL LOW (ref 3.5–5.1)
Sodium: 138 mEq/L (ref 135–145)
Sodium: 141 mEq/L (ref 135–145)
Total Bilirubin: 0.9 mg/dL (ref 0.3–1.2)
Total Protein: 7.6 g/dL (ref 6.0–8.3)

## 2010-05-01 LAB — CBC
HCT: 36.4 % (ref 36.0–46.0)
Hemoglobin: 12.1 g/dL (ref 12.0–15.0)
Hemoglobin: 12.3 g/dL (ref 12.0–15.0)
MCHC: 33.7 g/dL (ref 30.0–36.0)
MCHC: 34 g/dL (ref 30.0–36.0)
RBC: 3.98 MIL/uL (ref 3.87–5.11)
RDW: 13.4 % (ref 11.5–15.5)

## 2010-05-01 LAB — LIPASE, BLOOD: Lipase: 16 U/L (ref 11–59)

## 2010-05-01 LAB — DIFFERENTIAL
Basophils Absolute: 0 10*3/uL (ref 0.0–0.1)
Basophils Relative: 0 % (ref 0–1)
Eosinophils Absolute: 0 10*3/uL (ref 0.0–0.7)
Eosinophils Absolute: 0 10*3/uL (ref 0.0–0.7)
Lymphs Abs: 0.7 10*3/uL (ref 0.7–4.0)
Monocytes Relative: 4 % (ref 3–12)
Neutro Abs: 10.7 10*3/uL — ABNORMAL HIGH (ref 1.7–7.7)
Neutrophils Relative %: 90 % — ABNORMAL HIGH (ref 43–77)
Neutrophils Relative %: 92 % — ABNORMAL HIGH (ref 43–77)

## 2010-05-01 LAB — PREGNANCY, URINE: Preg Test, Ur: NEGATIVE

## 2010-06-06 NOTE — Op Note (Signed)
Tracey Daniel, Tracey Daniel            ACCOUNT NO.:  [id number]   MEDICAL RECORD NO.:  [id number]          PATIENT TYPE:  AMB   LOCATION:  DAY                           FACILITY:  [facility]   PHYSICIAN:  Vickki Hearing, M.D.DATE OF BIRTH:  04-26-1980   DATE OF PROCEDURE:  01/29/2006  DATE OF DISCHARGE:                               OPERATIVE REPORT   INDICATIONS FOR SURGERY AND HISTORY:  This is a 30 year old female with  symptoms of carpal tunnel syndrome of the left upper extremity and a  mass consistent with a ganglion.  The patient has a previously-noted  proximal volar laceration from several years ago.  Presented with pain  and paresthesias of the left upper extremity with the mass, wanted the  mass removed as well as release of the carpal tunnel.  After informed  consent procedure was completed, she agreed to surgery.   Other symptoms included numbness in the thumb, long and index finger  with weakness in the left upper extremity in terms of grip.  She  describes sharp, aching pain with tingling in those digits.  She failed  treatment with a brace and anti-inflammatories.   PREOPERATIVE DIAGNOSIS:  Carpal tunnel syndrome of left upper extremity  with a ganglion cyst volarly.   POSTOPERATIVE DIAGNOSES:  1. Left carpal tunnel syndrome.  2. 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   PROCEDURES:  1. Left carpal tunnel release.  2. Debridement of ruptured flexor carpi radialis.   OPERATIVE FINDINGS:  The carpal tunnel was noted to be tight.  Median  nerve was compressed.  The FCR was bulbous at the area of previous  rupture, and there was evidence of healing in progress versus possible  old healed tendon.  We biopsied it, debulked it, and sent it for  pathology specimen.   SURGEON:  Vickki Hearing, M.D.  no assistants.   ANESTHETIC:  Regional.   SPECIMENS:  Debrided FCR tendon, which was sent to pathology.   BLOOD LOSS:  Minimal.   COMPLICATIONS:  None.   COUNTS:  Correct.   The patient identified as Tracey Daniel in the preop holding area.  Proper marking of the surgical site by the surgeon and the patient.  Antibiotics were given.  History and physical was updated.  The patient  was taken to the operating room for a regional Bier block.  After  successful anesthetic, time-out procedure was completed.   Of course, the limb was prepped with DuraPrep.   A straight incision was made over the carpal tunnel, then a zigzag  incision was carried across the wrist and  over the mass.  The carpal  tunnel was released.  The median nerve was found to be compressed.  Upon  exploring the area where the mass was, there was no ganglion present but  the FCR tendon was noted to be bulbous and its appearance was consistent  with an old rupture.  This was debrided.  Proximal dissection was  carried out and there were thin wisps of tissue still connected to this  area.  This injury was obviously chronic and attempts at repair at this  point were not warranted   The wound was irrigated and closed with 3-0 nylon suture in interrupted  fashion.  We injected plain Marcaine in the areas of the wound to assist  with postoperative anesthesia, and a sterile dressing was applied,  tourniquet was released.  Fingers looked pink and capillary refill was  excellent.   The patient was taken to the recovery room in stable condition.  Discharge instructions are for elevation, keep the dressing clean and  dry.  She will be discharged on Lorcet Plus one q.4h. p.r.n. for pain,  #60 with two refills.      Vickki Hearing, M.D.  Electronically Signed     SEH/MEDQ  D:  01/29/2006  T:  01/29/2006  Job:  [id number]

## 2010-06-06 NOTE — H&P (Signed)
NAMELASEAN, RAHMING            ACCOUNT NO.:  [id number]   MEDICAL RECORD NO.:  [id number]          PATIENT TYPE:  AMB   LOCATION:  DAY                           FACILITY:  [facility]   PHYSICIAN:  Vickki Hearing, M.D.DATE OF BIRTH:  August 13, 1980   DATE OF ADMISSION:  DATE OF DISCHARGE:  LH                              HISTORY & PHYSICAL   PLANNED PROCEDURE:  Carpal tunnel release and ganglion excision, left  upper extremity.  The ganglion is volar.   CHIEF COMPLAINT:  Pain and numbness of the left hand with mass, left  hand.   HISTORY:  30 year old female with ganglion cyst and numbness of the  thumb, long, and index finger with weakness, left upper extremity.  She  describes aching, sharp pain, and tingling.  She presented to [place] on  Thursday, December 03, 2005.  Initial treatment included brace and anti-  inflammatories.  In followup on January 21, 2006, she had not improved.  We discussed and she agreed to have a left carpal tunnel release and  removal of the volar ganglion.   PAST MEDICAL HISTORY:  1. History of shortness of breath.  2. Ulcers.  3. Migraine headaches.  4. Osteoporosis.  5. Depression.  6. Seasonal allergies.  7. Sinusitis.  8. Bee sting allergy.   PAST SURGICAL HISTORY:  1. Cholecystectomy.  2. Surgery on the cervix with laser ablation.   FAMILY HISTORY:  Family history of arthritis, cancer, diabetes, and  asthma.   ALLERGIES:  PENICILLIN.   MEDICATIONS:  Ibuprofen, Topamax,  __________ , Combivent inhaler,  Zyrtec, Actonel, Os-Cal, Seroquel, Cymbalta.   SOCIAL HISTORY:  She does smoke a half-pack per day.   PHYSICAL EXAMINATION:  VITAL SIGNS:  Weight 141, pulse 80, respiratory  rate 18.  HEENT: Normal.  NECK:  Supple.  CHEST: Clear.  HEART:  Rate and rhythm normal.  ABDOMEN:  Soft.  EXTREMITIES:  Left upper extremity examination:  Immediately noted is a  volar ganglion on the radial side of the wrist.  There is tenderness  over the carpal  tunnel.  Range of motion, strength, and stability are  normal.  The sensory exam reveals decreased sensation in the median  nerve distribution.  She also had a compression test positive at less  than 15 seconds.  Phalen's test was negative.  There was some weakness  to grip on the left compared to the right.  Compression test, again, was  positive.   PLAN:  Left carpal tunnel release, removal of volar ganglion.  We  discussed other treatment options, and she decided to proceed with the  surgical treatment of release carpal tunnel and remove ganglion, left  upper extremity.  The risks and alternatives explained.  All questions  answered.  Informed consent process completed.  The patient will have  surgery on January 29, 2006 and followup on February 01, 2006.      Vickki Hearing, M.D.  Electronically Signed     SEH/MEDQ  D:  01/28/2006  T:  01/28/2006  Job:  [id number]   cc:   [facility]

## 2010-06-06 NOTE — Op Note (Signed)
NAME:  Tracey Daniel, Tracey Daniel                      ACCOUNT NO.:  [id number]   MEDICAL RECORD NO.:  [id number]                   PATIENT TYPE:  AMB   LOCATION:  DAY                                  FACILITY:  [facility]   PHYSICIAN:  Lazaro Arms, M.D.                DATE OF BIRTH:  1980-02-10   DATE OF PROCEDURE:  DATE OF DISCHARGE:                                 OPERATIVE REPORT   PREOPERATIVE DIAGNOSIS:  Moderate cervical dysplasia.   POSTOPERATIVE DIAGNOSIS:  Moderate cervical dysplasia.   PROCEDURE:  Laser ablation of the cervix.   SURGEON:  Lazaro Arms, M.D.   ANESTHESIA:  Laryngeal mask airway.   BLOOD LOSS:  None.   SPECIMENS:  None.   FINDINGS:  The patient was seen in the office, had an ASCUS Pap with high-  risk HPV.  Colposcopy and biopsy were performed and revealed a moderate  dysplasia.   DESCRIPTION OF OPERATION:  The patient was taken to the operating room and  placed in the supine position where she underwent laryngeal mask airway;  placed in the dorsal lithotomy position.  A Graves speculum was placed.  A  pericervical block was placed using 1/2% Marcaine with 1:200,000  epinephrine.  Acetic acid was used and an intraoperative colposcopy with the  laser microscope was performed.  The holmium laser was used and the  dysplasia was identified.  The entire transition zone was ablated using the  laser, taken down to a depth of approximately 7-9 mm centrally.  I got a  wide margin around all of the dysplasia.   There was no blood loss.  Monsel's was placed.  The patient tolerated the  procedure well.  She was awakened from anesthesia; taken to recovery in good  stable condition.  All counts were correct.      ___________________________________________                                            Lazaro Arms, M.D.   LHE/MEDQ  D:  04/12/2003  T:  04/12/2003  Job:  [id number]

## 2010-07-28 ENCOUNTER — Emergency Department (HOSPITAL_COMMUNITY)
Admission: EM | Admit: 2010-07-28 | Discharge: 2010-07-29 | Disposition: A | Discharge status: Home / Self Care | Payer: Medicaid Other | Attending: Emergency Medicine | Admitting: Emergency Medicine

## 2010-07-28 ENCOUNTER — Emergency Department (HOSPITAL_COMMUNITY)
Admission: EM | Admit: 2010-07-28 | Discharge: 2010-07-28 | Disposition: A | Discharge status: Home / Self Care | Payer: Medicaid Other | Source: Home / Self Care | Attending: Emergency Medicine | Admitting: Emergency Medicine

## 2010-07-28 DIAGNOSIS — K2920 Alcoholic gastritis without bleeding: Secondary | ICD-10-CM | POA: Insufficient documentation

## 2010-07-28 DIAGNOSIS — R1110 Vomiting, unspecified: Secondary | ICD-10-CM | POA: Insufficient documentation

## 2010-07-28 DIAGNOSIS — K259 Gastric ulcer, unspecified as acute or chronic, without hemorrhage or perforation: Secondary | ICD-10-CM | POA: Insufficient documentation

## 2010-07-28 DIAGNOSIS — N390 Urinary tract infection, site not specified: Secondary | ICD-10-CM | POA: Insufficient documentation

## 2010-07-28 DIAGNOSIS — M810 Age-related osteoporosis without current pathological fracture: Secondary | ICD-10-CM | POA: Insufficient documentation

## 2010-07-28 DIAGNOSIS — J438 Other emphysema: Secondary | ICD-10-CM | POA: Insufficient documentation

## 2010-07-28 DIAGNOSIS — Z79899 Other long term (current) drug therapy: Secondary | ICD-10-CM | POA: Insufficient documentation

## 2010-07-28 DIAGNOSIS — F17200 Nicotine dependence, unspecified, uncomplicated: Secondary | ICD-10-CM | POA: Insufficient documentation

## 2010-07-28 DIAGNOSIS — R109 Unspecified abdominal pain: Secondary | ICD-10-CM | POA: Insufficient documentation

## 2010-07-28 DIAGNOSIS — R1013 Epigastric pain: Secondary | ICD-10-CM | POA: Insufficient documentation

## 2010-07-28 LAB — COMPREHENSIVE METABOLIC PANEL
ALT: 17 U/L (ref 0–35)
CO2: 22 mEq/L (ref 19–32)
Calcium: 9.4 mg/dL (ref 8.4–10.5)
Creatinine, Ser: 0.66 mg/dL (ref 0.50–1.10)
GFR calc Af Amer: >60 mL/min (ref >60)
GFR calc non Af Amer: >60 mL/min (ref >60)
Glucose, Bld: 147 mg/dL — ABNORMAL HIGH (ref 70–99)
Total Bilirubin: 0.3 mg/dL (ref 0.3–1.2)

## 2010-07-28 LAB — DIFFERENTIAL
Lymphocytes Relative: 10 % — ABNORMAL LOW (ref 12–46)
Lymphs Abs: 1.4 10*3/uL (ref 0.7–4.0)
Monocytes Relative: 7 % (ref 3–12)
Neutro Abs: 12.1 10*3/uL — ABNORMAL HIGH (ref 1.7–7.7)
Neutrophils Relative %: 83 % — ABNORMAL HIGH (ref 43–77)

## 2010-07-28 LAB — URINE MICROSCOPIC-ADD ON

## 2010-07-28 LAB — CBC
HCT: 30.9 % — ABNORMAL LOW (ref 36.0–46.0)
Hemoglobin: 10.6 g/dL — ABNORMAL LOW (ref 12.0–15.0)
MCH: 29.4 pg (ref 26.0–34.0)
MCV: 85.8 fL (ref 78.0–100.0)
RBC: 3.6 MIL/uL — ABNORMAL LOW (ref 3.87–5.11)

## 2010-07-28 LAB — URINALYSIS, ROUTINE W REFLEX MICROSCOPIC
Hgb urine dipstick: NEGATIVE
Ketones, ur: >80 mg/dL — AB
Protein, ur: 30 mg/dL — AB
Urobilinogen, UA: 0.2 mg/dL (ref 0.0–1.0)

## 2010-07-29 ENCOUNTER — Emergency Department (HOSPITAL_COMMUNITY): Payer: Medicaid Other

## 2010-07-29 LAB — URINALYSIS, ROUTINE W REFLEX MICROSCOPIC
Glucose, UA: NEGATIVE mg/dL
Hgb urine dipstick: NEGATIVE
Ketones, ur: >80 mg/dL — AB
Protein, ur: 100 mg/dL — AB

## 2010-07-29 LAB — CBC
HCT: 32.3 % — ABNORMAL LOW (ref 36.0–46.0)
MCH: 29.7 pg (ref 26.0–34.0)
MCHC: 35.3 g/dL (ref 30.0–36.0)
MCV: 84.1 fL (ref 78.0–100.0)
RDW: 13.8 % (ref 11.5–15.5)

## 2010-07-29 LAB — COMPREHENSIVE METABOLIC PANEL
ALT: 18 U/L (ref 0–35)
AST: 27 U/L (ref 0–37)
Albumin: 4.6 g/dL (ref 3.5–5.2)
Alkaline Phosphatase: 55 U/L (ref 39–117)
CO2: 21 mEq/L (ref 19–32)
Sodium: 137 mEq/L (ref 135–145)
Total Bilirubin: 0.5 mg/dL (ref 0.3–1.2)

## 2010-07-29 LAB — DIFFERENTIAL
Basophils Absolute: 0 10*3/uL (ref 0.0–0.1)
Eosinophils Relative: 0 % (ref 0–5)
Lymphocytes Relative: 12 % (ref 12–46)
Monocytes Absolute: 1.5 10*3/uL — ABNORMAL HIGH (ref 0.1–1.0)
Monocytes Relative: 10 % (ref 3–12)

## 2010-07-29 LAB — URINE MICROSCOPIC-ADD ON

## 2010-07-29 MED ORDER — IOHEXOL 300 MG/ML  SOLN
80.0000 mL | Freq: Once | INTRAMUSCULAR | Status: AC | PRN
  Administered 2010-07-29: 80 mL via INTRAVENOUS

## 2010-08-20 ENCOUNTER — Encounter: Payer: Self-pay | Admitting: Family Medicine

## 2010-08-21 ENCOUNTER — Ambulatory Visit (INDEPENDENT_AMBULATORY_CARE_PROVIDER_SITE_OTHER): Payer: Medicare Other | Admitting: Family Medicine

## 2010-08-21 ENCOUNTER — Encounter: Payer: Self-pay | Admitting: Family Medicine

## 2010-08-21 VITALS — BP 110/80 | HR 67 | Temp 99.3°F | Resp 16 | Ht 66.0 in | Wt 141.1 lb

## 2010-08-21 DIAGNOSIS — N3090 Cystitis, unspecified without hematuria: Secondary | ICD-10-CM

## 2010-08-21 DIAGNOSIS — Z23 Encounter for immunization: Secondary | ICD-10-CM

## 2010-08-21 DIAGNOSIS — F329 Major depressive disorder, single episode, unspecified: Secondary | ICD-10-CM

## 2010-08-21 DIAGNOSIS — R5383 Other fatigue: Secondary | ICD-10-CM

## 2010-08-21 DIAGNOSIS — J45909 Unspecified asthma, uncomplicated: Secondary | ICD-10-CM

## 2010-08-21 DIAGNOSIS — R5381 Other malaise: Secondary | ICD-10-CM

## 2010-08-21 DIAGNOSIS — K219 Gastro-esophageal reflux disease without esophagitis: Secondary | ICD-10-CM | POA: Insufficient documentation

## 2010-08-21 DIAGNOSIS — R1013 Epigastric pain: Secondary | ICD-10-CM

## 2010-08-21 DIAGNOSIS — N390 Urinary tract infection, site not specified: Secondary | ICD-10-CM

## 2010-08-21 DIAGNOSIS — K3189 Other diseases of stomach and duodenum: Secondary | ICD-10-CM

## 2010-08-21 LAB — POCT URINALYSIS DIPSTICK
Bilirubin, UA: NEGATIVE
Blood, UA: NEGATIVE
Ketones, UA: NEGATIVE
Nitrite, UA: POSITIVE
Spec Grav, UA: 1.02
pH, UA: 7

## 2010-08-21 LAB — CBC WITH DIFFERENTIAL/PLATELET
Basophils Relative: 0 % (ref 0–1)
Hemoglobin: 12.6 g/dL (ref 12.0–15.0)
Lymphs Abs: 2.6 10*3/uL (ref 0.7–4.0)
MCHC: 32.9 g/dL (ref 30.0–36.0)
Monocytes Relative: 7 % (ref 3–12)
Neutro Abs: 4.7 10*3/uL (ref 1.7–7.7)
Neutrophils Relative %: 59 % (ref 43–77)
RBC: 4.28 MIL/uL (ref 3.87–5.11)

## 2010-08-21 LAB — TSH: TSH: 1.069 u[IU]/mL (ref 0.350–4.500)

## 2010-08-21 MED ORDER — DULOXETINE HCL 30 MG PO CPEP: 30.0000 mg | ORAL_CAPSULE | Freq: Every day | ORAL | Status: DC

## 2010-08-21 MED ORDER — CIPROFLOXACIN HCL 500 MG PO TABS: 500.0000 mg | ORAL_TABLET | Freq: Two times a day (BID) | ORAL | Status: AC

## 2010-08-21 MED ORDER — BUDESONIDE-FORMOTEROL FUMARATE 80-4.5 MCG/ACT IN AERO: 2.0000 | INHALATION_SPRAY | Freq: Two times a day (BID) | RESPIRATORY_TRACT | Status: DC

## 2010-08-21 MED ORDER — ALBUTEROL SULFATE HFA 108 (90 BASE) MCG/ACT IN AERS: 2.0000 | INHALATION_SPRAY | Freq: Four times a day (QID) | RESPIRATORY_TRACT | Status: DC | PRN

## 2010-08-21 MED ORDER — OMEPRAZOLE 20 MG PO CPDR: 20.0000 mg | DELAYED_RELEASE_CAPSULE | Freq: Every day | ORAL | Status: DC

## 2010-08-21 NOTE — Progress Notes (Signed)
  Subjective:    Patient ID: Tracey Daniel, female    DOB: [birth year], 30 y.o.   MRN: [id number]  HPI Pt was in the ED 7/10 for abdominal pain, at that time her WBC was elevated. She was told to stop drinking and had started drinking daily since 2011 when her Mom passed, aolso her sibling recently incarcerated. C/o stomach pain, when she does and doesn't eat. C/o malodorous urine Yellow sputum x 2 days, no fever or chills, quit nicotine May 2011    Review of Systems Denies recent fever or chills. Denies sinus pressure, nasal congestion, ear pain or sore throat. Denies chest pains, palpitations and leg swelling Denies abdominal pain, nausea, vomiting,diarrhea or constipation.   Denies dysuria, frequency, hesitancy or incontinence. Denies joint pain, swelling and limitation in mobility. Denies headaches, seizures, numbness, or tingling. Denies depression, anxiety or insomnia. Denies skin break down or rash.        Objective:   Physical Exam Patient alert and oriented and in no cardiopulmonary distress.  HEENT: No facial asymmetry, EOMI, no sinus tenderness,  oropharynx pink and moist.  Neck supple no adenopathy.  Chest: Clear to auscultation bilaterally.  CVS: S1, S2 no murmurs, no S3.  ABD: Soft mild suprapubic tenderness, no renal angle tenderness. Bowel sounds normal.  Ext: No edema  MS: Adequate ROM spine, shoulders, hips and knees.  Skin: Intact, no ulcerations or rash noted.  Psych: Good eye contact,flat  affect. Memory intact  depressed appearing.  CNS: CN 2-12 intact, power, tone and sensation normal throughout.        Assessment & Plan:

## 2010-08-21 NOTE — Patient Instructions (Addendum)
F/U in 2 months.  I am happy that you have stopped smoking.  You need blood work and urine tested today.  I will send in an antibiotic for you if the urine is infected.   Med sent in for heartburn and  wheezing

## 2010-08-22 LAB — H. PYLORI ANTIBODY, IGG: H Pylori IgG: 0.96 {ISR} — ABNORMAL HIGH

## 2010-08-24 LAB — URINE CULTURE

## 2010-09-09 DIAGNOSIS — J449 Chronic obstructive pulmonary disease, unspecified: Secondary | ICD-10-CM | POA: Insufficient documentation

## 2010-09-09 DIAGNOSIS — N3090 Cystitis, unspecified without hematuria: Secondary | ICD-10-CM | POA: Insufficient documentation

## 2010-09-09 NOTE — Assessment & Plan Note (Signed)
Infected urine, antibiotic prescribed

## 2010-09-09 NOTE — Assessment & Plan Note (Signed)
Improved with smoking cessation, continue meds

## 2010-09-09 NOTE — Assessment & Plan Note (Signed)
Uncontrolled, pt to contact mental health in [place]

## 2010-10-09 ENCOUNTER — Telehealth: Payer: Self-pay | Admitting: Family Medicine

## 2010-10-09 ENCOUNTER — Other Ambulatory Visit: Payer: Self-pay | Admitting: Family Medicine

## 2010-10-09 NOTE — Telephone Encounter (Signed)
No need to call pt or company

## 2010-10-09 NOTE — Telephone Encounter (Signed)
pls advise pt that due to insurance coverage , her proventil is changed  To ventolin hfa, I will enter historically pls send after you spk with her. Let her know they are the same medications

## 2010-10-14 LAB — COMPREHENSIVE METABOLIC PANEL
ALT: 14
AST: 32
Calcium: 10.3
Creatinine, Ser: 0.91
GFR calc Af Amer: >60
Sodium: 141
Total Protein: 7.6

## 2010-10-14 LAB — URINE MICROSCOPIC-ADD ON

## 2010-10-14 LAB — CBC
MCHC: 33.7
MCV: 88.9
RDW: 13.2

## 2010-10-14 LAB — LIPASE, BLOOD: Lipase: 21

## 2010-10-14 LAB — DIFFERENTIAL
Eosinophils Absolute: 0
Eosinophils Relative: 0
Lymphocytes Relative: 8 — ABNORMAL LOW
Lymphs Abs: 0.9
Monocytes Relative: 5
Neutrophils Relative %: 87 — ABNORMAL HIGH

## 2010-10-14 LAB — URINALYSIS, ROUTINE W REFLEX MICROSCOPIC
Bilirubin Urine: NEGATIVE
Glucose, UA: NEGATIVE
Hgb urine dipstick: NEGATIVE
Specific Gravity, Urine: 1.027
pH: 8.5 — ABNORMAL HIGH

## 2010-10-14 LAB — POCT PREGNANCY, URINE: Preg Test, Ur: NEGATIVE

## 2010-10-16 LAB — URINALYSIS, ROUTINE W REFLEX MICROSCOPIC
Glucose, UA: NEGATIVE
Glucose, UA: NEGATIVE
Hgb urine dipstick: NEGATIVE
Ketones, ur: 40 — AB
Ketones, ur: >80 — AB
Nitrite: NEGATIVE
Protein, ur: 100 — AB
Protein, ur: 30 — AB
Urobilinogen, UA: 1
pH: 8.5 — ABNORMAL HIGH

## 2010-10-16 LAB — BASIC METABOLIC PANEL
CO2: 36 — ABNORMAL HIGH
Chloride: 98
Creatinine, Ser: 0.98
GFR calc Af Amer: >60
Potassium: 2.8 — ABNORMAL LOW

## 2010-10-16 LAB — DIFFERENTIAL
Basophils Relative: 0
Eosinophils Absolute: 0
Eosinophils Relative: 0
Lymphocytes Relative: 5 — ABNORMAL LOW
Lymphs Abs: 2.2
Lymphs Abs: 1
Monocytes Absolute: 1.1 — ABNORMAL HIGH
Monocytes Relative: 10
Monocytes Relative: 5
Neutro Abs: 17.4 — ABNORMAL HIGH
Neutrophils Relative %: 68
Neutrophils Relative %: 90 — ABNORMAL HIGH

## 2010-10-16 LAB — POCT I-STAT, CHEM 8
BUN: 15
Chloride: 104
HCT: 40
Sodium: 139

## 2010-10-16 LAB — CBC
HCT: 37.9
MCHC: 33.8
MCV: 88.5
MCV: 88.8
Platelets: 164
RBC: 4.29
RBC: 4.19
WBC: 10.7 — ABNORMAL HIGH
WBC: 19.3 — ABNORMAL HIGH

## 2010-10-16 LAB — URINE MICROSCOPIC-ADD ON

## 2010-10-16 LAB — POCT PREGNANCY, URINE
Operator id: 277751
Preg Test, Ur: NEGATIVE

## 2010-10-16 LAB — PREGNANCY, URINE: Preg Test, Ur: NEGATIVE

## 2010-10-28 ENCOUNTER — Encounter: Payer: Self-pay | Admitting: Family Medicine

## 2010-10-30 ENCOUNTER — Ambulatory Visit (INDEPENDENT_AMBULATORY_CARE_PROVIDER_SITE_OTHER): Payer: Medicare Other | Admitting: Family Medicine

## 2010-10-30 ENCOUNTER — Encounter: Payer: Self-pay | Admitting: Family Medicine

## 2010-10-30 VITALS — BP 110/70 | HR 58 | Resp 16 | Ht 66.0 in | Wt 136.1 lb

## 2010-10-30 DIAGNOSIS — K3189 Other diseases of stomach and duodenum: Secondary | ICD-10-CM

## 2010-10-30 DIAGNOSIS — L259 Unspecified contact dermatitis, unspecified cause: Secondary | ICD-10-CM

## 2010-10-30 DIAGNOSIS — R21 Rash and other nonspecific skin eruption: Secondary | ICD-10-CM

## 2010-10-30 DIAGNOSIS — F329 Major depressive disorder, single episode, unspecified: Secondary | ICD-10-CM

## 2010-10-30 DIAGNOSIS — J45909 Unspecified asthma, uncomplicated: Secondary | ICD-10-CM

## 2010-10-30 DIAGNOSIS — A048 Other specified bacterial intestinal infections: Secondary | ICD-10-CM

## 2010-10-30 DIAGNOSIS — Z23 Encounter for immunization: Secondary | ICD-10-CM

## 2010-10-30 DIAGNOSIS — R1013 Epigastric pain: Secondary | ICD-10-CM

## 2010-10-30 NOTE — Patient Instructions (Addendum)
F/U  In 4 months.  You will be treated for bacterial infection which may be causing heartburn   You will be referred to mental health in [place] for depression.Also to dermatology for rash   Flu vaccine today

## 2010-11-02 NOTE — Progress Notes (Signed)
  Subjective:    Patient ID: Tracey Daniel, female    DOB: [birth year], 30 y.o.   MRN: [id number]  HPI Pt is here for her welcome to medicare visit.Thios will be treated a s a regular f/u  She has completed the questionaire prior to the visit as requested , however the accuracy of current medication cannot be verified as she has not brought in her meds, and actually some doses noted are inaccurate.  Still reports has not established with psychiatry, which she needsThough not actively suicidal or homicidal, she reports uncontrolled symptoms of depression . Pt did not bring her medication but has provided a list She c/o rash, also of uncontrolled bloating, heartburn and dyspepsia. She is followed by Dr Despina Hidden, gynae, Dr Hilda Lias , ortho and myself No hospitalizations this past year Had eye exam in the past 18 months   Review of Systems See HPI Denies recent fever or chills. Denies sinus pressure, nasal congestion, ear pain or sore throat. Denies chest congestion, productive cough or wheezing. Denies chest pains, palpitations and leg swelling C/o abdominal pain, nausea and bloating, denies vomiting,diarrhea or constipation.   Denies dysuria, frequency, hesitancy or incontinence. Denies joint pain, swelling and limitation in mobility. Denies headaches, seizures, numbness, or tingling. Denies  anxiety or insomnia. C/o  rash.        Objective:   Physical Exam Patient alert and oriented and in no cardiopulmonary distress.  HEENT: No facial asymmetry, EOMI, no sinus tenderness,  oropharynx pink and moist.  Neck supple no adenopathy.  Chest: Clear to auscultation bilaterally.adequate air entry throughout  CVS: S1, S2 no murmurs, no S3.  ABD: Soft non tender. Bowel sounds normal.  Ext: No edema  MS: Adequate ROM spine, shoulders, hips and knees.  Skin: Intact,maculopapular  rash noted.  Psych: Good eye contact, normal affect. Memory intact not anxious or depressed  appearing.  CNS: CN 2-12 intact, power, tone and sensation normal throughout.        Assessment & Plan:

## 2010-11-03 ENCOUNTER — Other Ambulatory Visit: Payer: Self-pay | Admitting: *Deleted

## 2010-11-03 ENCOUNTER — Telehealth: Payer: Self-pay | Admitting: Family Medicine

## 2010-11-03 DIAGNOSIS — A048 Other specified bacterial intestinal infections: Secondary | ICD-10-CM | POA: Insufficient documentation

## 2010-11-03 MED ORDER — OMEPRAZOLE 20 MG PO CPDR: 20.0000 mg | DELAYED_RELEASE_CAPSULE | Freq: Every day | ORAL | Status: DC

## 2010-11-03 MED ORDER — METRONIDAZOLE 250 MG PO TABS: 250.0000 mg | ORAL_TABLET | Freq: Four times a day (QID) | ORAL | Status: DC

## 2010-11-03 MED ORDER — TETRACYCLINE HCL 500 MG PO CAPS: 500.0000 mg | ORAL_CAPSULE | Freq: Four times a day (QID) | ORAL | Status: DC

## 2010-11-03 MED ORDER — OMEPRAZOLE 40 MG PO CPDR: 40.0000 mg | DELAYED_RELEASE_CAPSULE | Freq: Every day | ORAL | Status: DC

## 2010-11-03 MED ORDER — CLARITHROMYCIN 500 MG PO TABS: 500.0000 mg | ORAL_TABLET | Freq: Three times a day (TID) | ORAL | Status: AC

## 2010-11-03 NOTE — Assessment & Plan Note (Signed)
Progressive deterioration in rash,referred to dermatology

## 2010-11-03 NOTE — Telephone Encounter (Signed)
Pt advised med has been sent in

## 2010-11-03 NOTE — Assessment & Plan Note (Signed)
Uncontrolled needs to follow with gynae

## 2010-11-03 NOTE — Assessment & Plan Note (Signed)
Controlled continue medication 

## 2010-11-03 NOTE — Assessment & Plan Note (Signed)
Symptomatic with positive H pylori, med prescribed

## 2010-12-18 ENCOUNTER — Emergency Department (HOSPITAL_COMMUNITY): Payer: Medicare Other

## 2010-12-18 ENCOUNTER — Emergency Department (HOSPITAL_COMMUNITY)
Admission: EM | Admit: 2010-12-18 | Discharge: 2010-12-18 | Disposition: A | Discharge status: Home / Self Care | Payer: Medicare Other | Attending: Emergency Medicine | Admitting: Emergency Medicine

## 2010-12-18 ENCOUNTER — Encounter (HOSPITAL_COMMUNITY): Payer: Self-pay | Admitting: *Deleted

## 2010-12-18 DIAGNOSIS — K2990 Gastroduodenitis, unspecified, without bleeding: Secondary | ICD-10-CM | POA: Insufficient documentation

## 2010-12-18 DIAGNOSIS — J45909 Unspecified asthma, uncomplicated: Secondary | ICD-10-CM | POA: Insufficient documentation

## 2010-12-18 DIAGNOSIS — R1013 Epigastric pain: Secondary | ICD-10-CM | POA: Insufficient documentation

## 2010-12-18 DIAGNOSIS — K2970 Gastritis, unspecified, without bleeding: Secondary | ICD-10-CM | POA: Insufficient documentation

## 2010-12-18 DIAGNOSIS — R112 Nausea with vomiting, unspecified: Secondary | ICD-10-CM | POA: Insufficient documentation

## 2010-12-18 LAB — URINALYSIS, ROUTINE W REFLEX MICROSCOPIC
Bilirubin Urine: NEGATIVE
Glucose, UA: NEGATIVE mg/dL
Hgb urine dipstick: NEGATIVE
Ketones, ur: 15 mg/dL — AB
Protein, ur: 100 mg/dL — AB
Urobilinogen, UA: 0.2 mg/dL (ref 0.0–1.0)

## 2010-12-18 LAB — URINE MICROSCOPIC-ADD ON

## 2010-12-18 LAB — DIFFERENTIAL
Basophils Relative: 0 % (ref 0–1)
Eosinophils Absolute: 0 10*3/uL (ref 0.0–0.7)
Eosinophils Relative: 0 % (ref 0–5)
Lymphs Abs: 2.1 10*3/uL (ref 0.7–4.0)
Monocytes Relative: 6 % (ref 3–12)
Neutrophils Relative %: 80 % — ABNORMAL HIGH (ref 43–77)

## 2010-12-18 LAB — POCT PREGNANCY, URINE: Preg Test, Ur: NEGATIVE

## 2010-12-18 LAB — CBC
Hemoglobin: 11.3 g/dL — ABNORMAL LOW (ref 12.0–15.0)
MCH: 29.7 pg (ref 26.0–34.0)
MCHC: 33.7 g/dL (ref 30.0–36.0)
MCV: 87.9 fL (ref 78.0–100.0)
RBC: 3.81 MIL/uL — ABNORMAL LOW (ref 3.87–5.11)

## 2010-12-18 LAB — POCT I-STAT, CHEM 8
BUN: 15 mg/dL (ref 6–23)
Calcium, Ion: 1.11 mmol/L — ABNORMAL LOW (ref 1.12–1.32)
Chloride: 107 mEq/L (ref 96–112)
Creatinine, Ser: 0.8 mg/dL (ref 0.50–1.10)
Glucose, Bld: 150 mg/dL — ABNORMAL HIGH (ref 70–99)
TCO2: 22 mmol/L (ref 0–100)

## 2010-12-18 MED ORDER — HYDROMORPHONE HCL PF 1 MG/ML IJ SOLN
INTRAMUSCULAR | Status: AC
  Administered 2010-12-18: 1 mg via INTRAVENOUS
  Filled 2010-12-18: qty 1

## 2010-12-18 MED ORDER — SODIUM CHLORIDE 0.9 % IV BOLUS (SEPSIS): 1000.0000 mL | Freq: Once | INTRAVENOUS | Status: DC

## 2010-12-18 MED ORDER — HYDROMORPHONE HCL PF 1 MG/ML IJ SOLN
1.0000 mg | Freq: Once | INTRAMUSCULAR | Status: DC
  Filled 2010-12-18: qty 1

## 2010-12-18 MED ORDER — ONDANSETRON HCL 4 MG/2ML IJ SOLN
INTRAMUSCULAR | Status: AC
  Administered 2010-12-18: 4 mg via INTRAVENOUS
  Filled 2010-12-18: qty 2

## 2010-12-18 MED ORDER — ONDANSETRON HCL 4 MG/2ML IJ SOLN
4.0000 mg | Freq: Once | INTRAMUSCULAR | Status: AC
  Administered 2010-12-18: 4 mg via INTRAVENOUS

## 2010-12-18 MED ORDER — RANITIDINE HCL 150 MG PO CAPS: 150.0000 mg | ORAL_CAPSULE | Freq: Two times a day (BID) | ORAL | Status: DC

## 2010-12-18 MED ORDER — OMEPRAZOLE 20 MG PO CPDR: 20.0000 mg | DELAYED_RELEASE_CAPSULE | Freq: Every day | ORAL | Status: DC

## 2010-12-18 MED ORDER — HYDROCODONE-ACETAMINOPHEN 5-500 MG PO TABS: 1.0000 | ORAL_TABLET | Freq: Four times a day (QID) | ORAL | Status: AC | PRN

## 2010-12-18 MED ORDER — IOHEXOL 300 MG/ML  SOLN
100.0000 mL | Freq: Once | INTRAMUSCULAR | Status: AC | PRN
  Administered 2010-12-18: 100 mL via INTRAVENOUS

## 2010-12-18 MED ORDER — ALUMINUM & MAGNESIUM HYDROXIDE 200-200 MG/5ML PO SUSP: 15.0000 mL | Freq: Four times a day (QID) | ORAL | Status: DC | PRN

## 2010-12-18 MED ORDER — HYDROMORPHONE HCL PF 1 MG/ML IJ SOLN
1.0000 mg | Freq: Once | INTRAMUSCULAR | Status: AC
  Administered 2010-12-18: 1 mg via INTRAVENOUS

## 2010-12-18 MED ORDER — DICYCLOMINE HCL 20 MG PO TABS: 20.0000 mg | ORAL_TABLET | Freq: Two times a day (BID) | ORAL | Status: DC

## 2010-12-18 NOTE — ED Provider Notes (Signed)
History     CSN: [id number] Arrival date & time: 12/18/2010  5:34 PM   First MD Initiated Contact with Patient 12/18/10 1756      Chief Complaint  Patient presents with  . Abdominal Pain    (Consider location/radiation/quality/duration/timing/severity/associated sxs/prior treatment) The history is provided by the patient.   patient is a 30 year old female with history of possible ulcer who presents with abdominal pain. This started about 6 or 7 hours prior to evaluation. She had mild proceeding nausea and a couple episodes of vomiting. After the vomiting she had severe midepigastric pain.  It occasionally radiates to back and has been constant. She does not know if food makes this better or worse.  It is still associated with nausea and vomiting. The vomitus not have any blood or bilious contents. She's had about 5 episodes of loose stools but previously her has not had any issues with bowel movements. Denies recent fever. Denies sick contacts. Notes her urine may feel a little hot but denies dysuria or urinary frequency. She has had similar symptoms to this before but this is more severe. Overall severity described as moderate to severe.  Reports uncomplicated cholecystectomy several years ago.  Denies vaginal bleeding or discharge.  Past Medical History  Diagnosis Date  . Asthma     Past Surgical History  Procedure Date  . Cholecystectomy   . Laser surgery on cervix   . Carpal tunnel release     left hand    Family History  Problem Relation Age of Onset  . Diabetes Father     History  Substance Use Topics  . Smoking status: Former Games developer  . Smokeless tobacco: Not on file  . Alcohol Use: No    OB History    Grav Para Term Preterm Abortions TAB SAB Ect Mult Living                  Review of Systems  Constitutional: Positive for chills. Negative for fever.  HENT: Negative for facial swelling.   Eyes: Negative for visual disturbance.  Respiratory: Negative for  cough, chest tightness, shortness of breath and wheezing.   Cardiovascular: Negative for chest pain.  Gastrointestinal: Positive for nausea, vomiting, abdominal pain and diarrhea. Negative for constipation and blood in stool.  Genitourinary: Positive for dysuria ("hot"). Negative for difficulty urinating.  Skin: Negative for rash.  Neurological: Negative for weakness and numbness.  Psychiatric/Behavioral: Negative for behavioral problems and confusion.  All other systems reviewed and are negative.    Allergies  Penicillins  Home Medications   Current Outpatient Rx  Name Route Sig Dispense Refill  . ALBUTEROL SULFATE HFA 108 (90 BASE) MCG/ACT IN AERS Inhalation Inhale 2 puffs into the lungs every 6 (six) hours as needed. Shortness of breath     . BUDESONIDE-FORMOTEROL FUMARATE 80-4.5 MCG/ACT IN AERO Inhalation Inhale 2 puffs into the lungs 2 (two) times daily. 1 Inhaler 12  . DULOXETINE HCL 30 MG PO CPEP Oral Take 1 capsule (30 mg total) by mouth daily. 30 capsule 2  . OMEPRAZOLE 20 MG PO CPDR Oral Take 1 capsule (20 mg total) by mouth daily. 14 capsule 0    Start after completion of 40mg  capsule for 2 weeks  . OMEPRAZOLE 40 MG PO CPDR Oral Take 1 capsule (40 mg total) by mouth daily. 14 capsule 0    Start on the same day that clarithromycin is start ...  . ALUMINUM & MAGNESIUM HYDROXIDE 200-200 MG/5ML PO SUSP Oral Take 15  mLs by mouth every 6 (six) hours as needed for indigestion. 355 mL 0  . DICYCLOMINE HCL 20 MG PO TABS Oral Take 1 tablet (20 mg total) by mouth 2 (two) times daily. 20 tablet 0  . HYDROCODONE-ACETAMINOPHEN 5-500 MG PO TABS Oral Take 1-2 tablets by mouth every 6 (six) hours as needed for pain. 15 tablet 0  . OMEPRAZOLE 20 MG PO CPDR Oral Take 1 capsule (20 mg total) by mouth daily. 14 capsule 0  . RANITIDINE HCL 150 MG PO CAPS Oral Take 1 capsule (150 mg total) by mouth 2 (two) times daily. 30 capsule 0    BP 110/60  Pulse 59  Temp(Src) 98.5 F (36.9 C) (Oral)   Resp 20  SpO2 100%  Physical Exam  Nursing note and vitals reviewed. Constitutional: She is oriented to person, place, and time. She appears well-developed and well-nourished. No distress.       In pain   HENT:  Head: Normocephalic.  Nose: Nose normal.  Eyes: EOM are normal.  Neck: Normal range of motion. Neck supple.  Cardiovascular: Normal rate, regular rhythm and intact distal pulses.   Pulmonary/Chest: Effort normal and breath sounds normal. No respiratory distress.  Abdominal: Soft. She exhibits no distension. There is tenderness (moderate to severe over epigastrium; mild periumbilical ttp). There is guarding (mild voluntary). There is no rebound.  Musculoskeletal: Normal range of motion. She exhibits no edema and no tenderness.  Neurological: She is alert and oriented to person, place, and time.       Normal strength  Skin: Skin is warm and dry. No rash noted. She is not diaphoretic.  Psychiatric: She has a normal mood and affect. Her behavior is normal. Thought content normal.    ED Course  Procedures (including critical care time)  Labs Reviewed  CBC - Abnormal; Notable for the following:    WBC 15.5 (*)    RBC 3.81 (*)    Hemoglobin 11.3 (*)    HCT 33.5 (*)    Platelets 145 (*)    All other components within normal limits  DIFFERENTIAL - Abnormal; Notable for the following:    Neutrophils Relative 80 (*)    Neutro Abs 12.3 (*)    All other components within normal limits  URINALYSIS, ROUTINE W REFLEX MICROSCOPIC - Abnormal; Notable for the following:    APPearance HAZY (*)    pH 8.5 (*)    Ketones, ur 15 (*)    Protein, ur 100 (*)    All other components within normal limits  POCT I-STAT, CHEM 8 - Abnormal; Notable for the following:    Potassium 3.4 (*)    Glucose, Bld 150 (*)    Calcium, Ion 1.11 (*)    All other components within normal limits  LIPASE, BLOOD  POCT PREGNANCY, URINE  URINE MICROSCOPIC-ADD ON  I-STAT, CHEM 8  POCT PREGNANCY, URINE   Ct  Abdomen Pelvis W Contrast  12/18/2010  *RADIOLOGY REPORT*  Clinical Data: Epigastric pain  CT ABDOMEN AND PELVIS WITH CONTRAST  Technique:  Multidetector CT imaging of the abdomen and pelvis was performed following the standard protocol during bolus administration of intravenous contrast.  Contrast: OMNIPAQUE IOHEXOL 300 MG/ML IV SOLN  Comparison: 07/29/2010  Findings: Post cholecystectomy.  Liver, spleen, pancreas, adrenal glands are within normal limits.  Stable small cyst in the left kidney.  Unremarkable right kidney.  No free fluid.  No abnormal adenopathy.  Normal appendix.  No destructive bone lesion.  IMPRESSION: No acute  pathology.  Original Report Authenticated By: Donavan Burnet, M.D.     1. Gastritis       MDM    Patient here with adult-onset epigastric pain associated nausea and vomiting. There was no hematemesis or blood in stool. On initial exam patient has significant amount of pain with marked moderate to severe epigastric tenderness palpation. The symptoms were remarkably improved after one on the medications. Labs revealed leukocytosis but normal lipase. Based on initial presentation and leukocytosis CT abdomen obtained. The CT was unremarkable. No evidence of intra-abdominal infection or obstructive process. The clinical picture is most consistent with gastritis. Patient reports possible history of ulcer still have her followup with gastroenterology. Patient tolerating by mouth and well-appearing at discharge return precautions discussed.        Milus Glazier 12/19/10 [phone]

## 2010-12-18 NOTE — ED Notes (Signed)
CT notified of pt finished drinking contrast 

## 2010-12-18 NOTE — ED Notes (Signed)
rx x 5, pt voiced understanding to f/u with GI MD as well as PCP.

## 2010-12-18 NOTE — ED Notes (Signed)
Pt is here for abdominal pain and back pain.  This pain is severe and associated with n/v diarrhea and chills.

## 2010-12-19 ENCOUNTER — Telehealth: Payer: Self-pay | Admitting: Family Medicine

## 2010-12-19 ENCOUNTER — Other Ambulatory Visit: Payer: Self-pay | Admitting: Family Medicine

## 2010-12-19 DIAGNOSIS — R1013 Epigastric pain: Secondary | ICD-10-CM

## 2010-12-19 NOTE — Telephone Encounter (Signed)
pls refer to GI for ed f/u for epigastric pain and nausea. Let her know referral placed

## 2010-12-19 NOTE — Telephone Encounter (Signed)
Pt was referred to dr.rourk office. They will call her with appt and time. Pt is aware of this

## 2010-12-20 NOTE — ED Provider Notes (Signed)
I saw and evaluated the patient, reviewed the resident's note and I agree with the findings and plan. Abdominal pain with some leukocytosis. CT negative. Pain improved with treatment. GI follow up.   Juliet Rude. Rubin Payor, MD 12/20/10 [phone]

## 2010-12-23 ENCOUNTER — Emergency Department (HOSPITAL_COMMUNITY)
Admission: EM | Admit: 2010-12-23 | Discharge: 2010-12-23 | Disposition: A | Discharge status: Home / Self Care | Payer: Medicare Other | Source: Home / Self Care | Attending: Emergency Medicine | Admitting: Emergency Medicine

## 2010-12-23 ENCOUNTER — Encounter (HOSPITAL_COMMUNITY): Payer: Self-pay

## 2010-12-23 DIAGNOSIS — K2990 Gastroduodenitis, unspecified, without bleeding: Secondary | ICD-10-CM | POA: Insufficient documentation

## 2010-12-23 DIAGNOSIS — R10819 Abdominal tenderness, unspecified site: Secondary | ICD-10-CM | POA: Insufficient documentation

## 2010-12-23 DIAGNOSIS — K2970 Gastritis, unspecified, without bleeding: Secondary | ICD-10-CM

## 2010-12-23 DIAGNOSIS — R1013 Epigastric pain: Secondary | ICD-10-CM | POA: Insufficient documentation

## 2010-12-23 DIAGNOSIS — J45909 Unspecified asthma, uncomplicated: Secondary | ICD-10-CM | POA: Insufficient documentation

## 2010-12-23 DIAGNOSIS — R112 Nausea with vomiting, unspecified: Secondary | ICD-10-CM | POA: Insufficient documentation

## 2010-12-23 DIAGNOSIS — F411 Generalized anxiety disorder: Secondary | ICD-10-CM | POA: Insufficient documentation

## 2010-12-23 DIAGNOSIS — R630 Anorexia: Secondary | ICD-10-CM | POA: Insufficient documentation

## 2010-12-23 DIAGNOSIS — Z79899 Other long term (current) drug therapy: Secondary | ICD-10-CM | POA: Insufficient documentation

## 2010-12-23 LAB — DIFFERENTIAL
Basophils Absolute: 0 10*3/uL (ref 0.0–0.1)
Basophils Relative: 0 % (ref 0–1)
Eosinophils Absolute: 0 10*3/uL (ref 0.0–0.7)
Monocytes Absolute: 0.9 10*3/uL (ref 0.1–1.0)
Monocytes Relative: 8 % (ref 3–12)

## 2010-12-23 LAB — POCT I-STAT, CHEM 8
Calcium, Ion: 1.13 mmol/L (ref 1.12–1.32)
Glucose, Bld: 127 mg/dL — ABNORMAL HIGH (ref 70–99)
HCT: 41 % (ref 36.0–46.0)
Hemoglobin: 13.9 g/dL (ref 12.0–15.0)
TCO2: 23 mmol/L (ref 0–100)

## 2010-12-23 LAB — URINALYSIS, ROUTINE W REFLEX MICROSCOPIC
Bilirubin Urine: NEGATIVE
Ketones, ur: >80 mg/dL — AB
Nitrite: NEGATIVE
Protein, ur: 30 mg/dL — AB
pH: 7 (ref 5.0–8.0)

## 2010-12-23 LAB — CBC
HCT: 38.3 % (ref 36.0–46.0)
Hemoglobin: 12.8 g/dL (ref 12.0–15.0)
MCH: 29.4 pg (ref 26.0–34.0)
MCHC: 33.4 g/dL (ref 30.0–36.0)
RDW: 12.9 % (ref 11.5–15.5)

## 2010-12-23 LAB — URINE MICROSCOPIC-ADD ON

## 2010-12-23 MED ORDER — MORPHINE SULFATE 4 MG/ML IJ SOLN
4.0000 mg | Freq: Once | INTRAMUSCULAR | Status: AC
  Administered 2010-12-23: 4 mg via INTRAVENOUS
  Filled 2010-12-23: qty 1

## 2010-12-23 MED ORDER — POTASSIUM CHLORIDE CRYS ER 20 MEQ PO TBCR
40.0000 meq | EXTENDED_RELEASE_TABLET | Freq: Once | ORAL | Status: AC
  Administered 2010-12-23: 40 meq via ORAL
  Filled 2010-12-23: qty 2

## 2010-12-23 MED ORDER — SODIUM CHLORIDE 0.9 % IV BOLUS (SEPSIS)
1000.0000 mL | Freq: Once | INTRAVENOUS | Status: AC
  Administered 2010-12-23: 1000 mL via INTRAVENOUS

## 2010-12-23 MED ORDER — ONDANSETRON HCL 4 MG/2ML IJ SOLN
4.0000 mg | Freq: Once | INTRAMUSCULAR | Status: AC
  Administered 2010-12-23: 4 mg via INTRAVENOUS
  Filled 2010-12-23: qty 2

## 2010-12-23 MED ORDER — METOCLOPRAMIDE HCL 5 MG/ML IJ SOLN
10.0000 mg | Freq: Once | INTRAMUSCULAR | Status: AC
  Administered 2010-12-23: 10 mg via INTRAVENOUS
  Filled 2010-12-23: qty 2

## 2010-12-23 MED ORDER — METOCLOPRAMIDE HCL 10 MG PO TABS: 10.0000 mg | ORAL_TABLET | Freq: Four times a day (QID) | ORAL | Status: DC

## 2010-12-23 NOTE — ED Provider Notes (Signed)
History     CSN: [id number] Arrival date & time: 12/23/2010  2:36 PM   First MD Initiated Contact with Patient 12/23/10 1623      Chief Complaint  Patient presents with  . Abdominal Pain    (Consider location/radiation/quality/duration/timing/severity/associated sxs/prior treatment) Patient is a 30 y.o. female presenting with abdominal pain. The history is provided by the patient.  Abdominal Pain The primary symptoms of the illness include abdominal pain, nausea and vomiting. The primary symptoms of the illness do not include fever, fatigue, shortness of breath, diarrhea, hematemesis, dysuria, vaginal discharge or vaginal bleeding. The onset of the illness was gradual. The problem has not changed since onset. The patient states that she believes she is currently not pregnant. The patient has not had a change in bowel habit. Additional symptoms associated with the illness include anorexia. Symptoms associated with the illness do not include chills, constipation, urgency, hematuria, frequency or back pain.   patient presents with epigastric abdominal pain. This started approximately week ago. She was seen here for the same 6 days ago. A CT scan was done at that time as well as labs, which are unremarkable. She was medicated in the department and responded well to this; she was diagnosed with gastritis. States that her symptoms seem to have worsened since she was seen before. She has nausea and vomiting, but denies change in bowel movements or urinary complaints. Has had no back pain.  Past Medical History  Diagnosis Date  . Asthma   . Ulcer of abdomen wall     Past Surgical History  Procedure Date  . Cholecystectomy   . Laser surgery on cervix   . Carpal tunnel release     left hand    Family History  Problem Relation Age of Onset  . Diabetes Father     History  Substance Use Topics  . Smoking status: Former Games developer  . Smokeless tobacco: Not on file  . Alcohol Use: No    OB  History    Grav Para Term Preterm Abortions TAB SAB Ect Mult Living                  Review of Systems  Constitutional: Negative for fever, chills and fatigue.  HENT: Negative.   Respiratory: Negative for chest tightness and shortness of breath.   Cardiovascular: Negative for chest pain and palpitations.  Gastrointestinal: Positive for nausea, vomiting, abdominal pain and anorexia. Negative for diarrhea, constipation, blood in stool, abdominal distention, anal bleeding, rectal pain and hematemesis.  Genitourinary: Negative for dysuria, urgency, frequency, hematuria, flank pain, vaginal bleeding, vaginal discharge and difficulty urinating.  Musculoskeletal: Negative for back pain.  Skin: Negative.   Neurological: Negative for dizziness and weakness.    Allergies  Penicillins  Home Medications   Current Outpatient Rx  Name Route Sig Dispense Refill  . ALBUTEROL SULFATE HFA 108 (90 BASE) MCG/ACT IN AERS Inhalation Inhale 2 puffs into the lungs every 6 (six) hours as needed. Shortness of breath     . BUDESONIDE-FORMOTEROL FUMARATE 80-4.5 MCG/ACT IN AERO Inhalation Inhale 2 puffs into the lungs 2 (two) times daily. 1 Inhaler 12  . DICYCLOMINE HCL 20 MG PO TABS Oral Take 1 tablet (20 mg total) by mouth 2 (two) times daily. 20 tablet 0  . DULOXETINE HCL 30 MG PO CPEP Oral Take 1 capsule (30 mg total) by mouth daily. 30 capsule 2  . HYDROCODONE-ACETAMINOPHEN 5-500 MG PO TABS Oral Take 1-2 tablets by mouth every 6 (six) hours  as needed for pain. 15 tablet 0  . OMEPRAZOLE 20 MG PO CPDR Oral Take 1 capsule (20 mg total) by mouth daily. 14 capsule 0    Start after completion of 40mg  capsule for 2 weeks  . RANITIDINE HCL 150 MG PO CAPS Oral Take 1 capsule (150 mg total) by mouth 2 (two) times daily. 30 capsule 0    BP 108/84  Pulse 84  Temp(Src) 98.3 F (36.8 C) (Oral)  Resp 22  Ht 5\' 6"  (1.676 m)  Wt 135 lb (61.236 kg)  BMI 21.79 kg/m2  SpO2 100%  Physical Exam  Nursing note and  vitals reviewed. Constitutional: She is oriented to person, place, and time. She appears well-developed and well-nourished. No distress.       Pt in no acute distress but is in pain  HENT:  Head: Normocephalic and atraumatic.  Right Ear: External ear normal.  Left Ear: External ear normal.  Nose: Nose normal.  Mouth/Throat: Oropharynx is clear and moist. No oropharyngeal exudate.  Eyes: Conjunctivae and EOM are normal. Pupils are equal, round, and reactive to light.  Neck: Normal range of motion. Neck supple.  Cardiovascular: Normal rate, regular rhythm, normal heart sounds and intact distal pulses.   No murmur heard. Pulmonary/Chest: Effort normal and breath sounds normal. She has no wheezes. She exhibits no tenderness.  Abdominal: Soft. She exhibits no distension and no mass. There is tenderness. There is no rebound and no guarding.       Diffuse moderate TTP, seems worst in LUQ  Musculoskeletal: Normal range of motion. She exhibits no edema.  Neurological: She is alert and oriented to person, place, and time.  Skin: Skin is warm and dry. No rash noted. She is not diaphoretic.  Psychiatric: Her mood appears anxious.    ED Course  Procedures (including critical care time)  Labs Reviewed  URINALYSIS, ROUTINE W REFLEX MICROSCOPIC - Abnormal; Notable for the following:    Ketones, ur >80 (*)    Protein, ur 30 (*)    Leukocytes, UA TRACE (*)    All other components within normal limits  CBC - Abnormal; Notable for the following:    WBC 10.6 (*)    All other components within normal limits  DIFFERENTIAL - Abnormal; Notable for the following:    Neutrophils Relative 80 (*)    Neutro Abs 8.4 (*)    All other components within normal limits  POCT I-STAT, CHEM 8 - Abnormal; Notable for the following:    Potassium 3.0 (*)    Glucose, Bld 127 (*)    All other components within normal limits  URINE MICROSCOPIC-ADD ON - Abnormal; Notable for the following:    Squamous Epithelial / LPF  FEW (*)    Bacteria, UA FEW (*)    Casts HYALINE CASTS (*)    Crystals CA OXALATE CRYSTALS (*)    All other components within normal limits  URINE CULTURE   No results found.   1. Gastritis       MDM  6:58 PM Pt was initially given morphine/zofran, which did not help her pain much; still very uncomfortable. Given Reglan which helped significantly; pt much more comfortable. Repeat abd exam with much decreased tenderness. I do not suspect acute abdomen based on findings today. Patient will be d/ced home with rx for Reglan. She was also given rx for Zantac during her visit last week but has not filled this rx yet. She was urged to get this filled and continue to  take it. A review of last week's note indicates that she was instructed to follow up with GI due to possible hx of an ulcer; I reiterated this with her. Reviewed reasons to return to ED. Pt verbalized understanding and agreed to plan.       Grant Fontana, [place] 12/24/10 1354

## 2010-12-23 NOTE — ED Notes (Signed)
Pt presents with >1 week h/o abdominal pain.  Pt reports pain is umbilical up to epigastric area that is now constant.  Pt reports pain radiates around to mid-back.  Pt seen here 6 days ago for same with symptoms worsening.  Pt has nausea, vomiting and diarrhea.  Pt reports a "warm" sensation with voiding and reports urine is darker that usual.

## 2010-12-24 ENCOUNTER — Inpatient Hospital Stay (HOSPITAL_COMMUNITY)
Admission: AD | Admit: 2010-12-24 | Discharge: 2011-01-02 | DRG: 381 | Disposition: A | Discharge status: Home / Self Care | Payer: Medicare Other | Source: Ambulatory Visit | Attending: Internal Medicine | Admitting: Internal Medicine

## 2010-12-24 ENCOUNTER — Encounter: Payer: Self-pay | Admitting: Gastroenterology

## 2010-12-24 ENCOUNTER — Encounter (HOSPITAL_COMMUNITY): Payer: Self-pay | Admitting: *Deleted

## 2010-12-24 ENCOUNTER — Inpatient Hospital Stay (HOSPITAL_COMMUNITY): Payer: Medicare Other

## 2010-12-24 ENCOUNTER — Ambulatory Visit (INDEPENDENT_AMBULATORY_CARE_PROVIDER_SITE_OTHER): Payer: Medicare Other | Admitting: Gastroenterology

## 2010-12-24 VITALS — BP 123/76 | HR 66 | Temp 99.1°F | Ht 66.0 in

## 2010-12-24 DIAGNOSIS — M79609 Pain in unspecified limb: Secondary | ICD-10-CM

## 2010-12-24 DIAGNOSIS — R5381 Other malaise: Secondary | ICD-10-CM

## 2010-12-24 DIAGNOSIS — K529 Noninfective gastroenteritis and colitis, unspecified: Secondary | ICD-10-CM

## 2010-12-24 DIAGNOSIS — R51 Headache: Secondary | ICD-10-CM

## 2010-12-24 DIAGNOSIS — K219 Gastro-esophageal reflux disease without esophagitis: Secondary | ICD-10-CM | POA: Diagnosis present

## 2010-12-24 DIAGNOSIS — K920 Hematemesis: Secondary | ICD-10-CM | POA: Diagnosis present

## 2010-12-24 DIAGNOSIS — K3184 Gastroparesis: Secondary | ICD-10-CM | POA: Diagnosis present

## 2010-12-24 DIAGNOSIS — K5900 Constipation, unspecified: Secondary | ICD-10-CM

## 2010-12-24 DIAGNOSIS — R1013 Epigastric pain: Secondary | ICD-10-CM

## 2010-12-24 DIAGNOSIS — N3000 Acute cystitis without hematuria: Secondary | ICD-10-CM

## 2010-12-24 DIAGNOSIS — J45909 Unspecified asthma, uncomplicated: Secondary | ICD-10-CM

## 2010-12-24 DIAGNOSIS — J209 Acute bronchitis, unspecified: Secondary | ICD-10-CM

## 2010-12-24 DIAGNOSIS — F3289 Other specified depressive episodes: Secondary | ICD-10-CM | POA: Diagnosis present

## 2010-12-24 DIAGNOSIS — R109 Unspecified abdominal pain: Secondary | ICD-10-CM

## 2010-12-24 DIAGNOSIS — R1110 Vomiting, unspecified: Secondary | ICD-10-CM | POA: Diagnosis present

## 2010-12-24 DIAGNOSIS — B373 Candidiasis of vulva and vagina: Secondary | ICD-10-CM | POA: Diagnosis present

## 2010-12-24 DIAGNOSIS — K313 Pylorospasm, not elsewhere classified: Secondary | ICD-10-CM | POA: Diagnosis present

## 2010-12-24 DIAGNOSIS — K311 Adult hypertrophic pyloric stenosis: Principal | ICD-10-CM | POA: Diagnosis present

## 2010-12-24 DIAGNOSIS — N912 Amenorrhea, unspecified: Secondary | ICD-10-CM

## 2010-12-24 DIAGNOSIS — F329 Major depressive disorder, single episode, unspecified: Secondary | ICD-10-CM

## 2010-12-24 DIAGNOSIS — A048 Other specified bacterial intestinal infections: Secondary | ICD-10-CM

## 2010-12-24 DIAGNOSIS — K2970 Gastritis, unspecified, without bleeding: Secondary | ICD-10-CM | POA: Diagnosis present

## 2010-12-24 DIAGNOSIS — E876 Hypokalemia: Secondary | ICD-10-CM | POA: Diagnosis present

## 2010-12-24 DIAGNOSIS — R197 Diarrhea, unspecified: Secondary | ICD-10-CM

## 2010-12-24 DIAGNOSIS — R21 Rash and other nonspecific skin eruption: Secondary | ICD-10-CM

## 2010-12-24 DIAGNOSIS — B3731 Acute candidiasis of vulva and vagina: Secondary | ICD-10-CM | POA: Diagnosis present

## 2010-12-24 DIAGNOSIS — E860 Dehydration: Secondary | ICD-10-CM | POA: Diagnosis present

## 2010-12-24 DIAGNOSIS — M549 Dorsalgia, unspecified: Secondary | ICD-10-CM

## 2010-12-24 DIAGNOSIS — S92309A Fracture of unspecified metatarsal bone(s), unspecified foot, initial encounter for closed fracture: Secondary | ICD-10-CM

## 2010-12-24 DIAGNOSIS — L259 Unspecified contact dermatitis, unspecified cause: Secondary | ICD-10-CM

## 2010-12-24 DIAGNOSIS — R1115 Cyclical vomiting syndrome unrelated to migraine: Secondary | ICD-10-CM

## 2010-12-24 DIAGNOSIS — R5383 Other fatigue: Secondary | ICD-10-CM

## 2010-12-24 LAB — URINE CULTURE
Colony Count: 10000
Culture  Setup Time: 201212040000

## 2010-12-24 LAB — COMPREHENSIVE METABOLIC PANEL
ALT: 16 U/L (ref 0–35)
Alkaline Phosphatase: 54 U/L (ref 39–117)
CO2: 28 mEq/L (ref 19–32)
Chloride: 103 mEq/L (ref 96–112)
GFR calc Af Amer: >90 mL/min (ref >90)
GFR calc non Af Amer: >90 mL/min (ref >90)
Glucose, Bld: 91 mg/dL (ref 70–99)
Potassium: 2.6 mEq/L — CL (ref 3.5–5.1)
Sodium: 142 mEq/L (ref 135–145)
Total Bilirubin: 0.2 mg/dL — ABNORMAL LOW (ref 0.3–1.2)

## 2010-12-24 LAB — DIFFERENTIAL
Basophils Absolute: 0 10*3/uL (ref 0.0–0.1)
Eosinophils Absolute: 0 10*3/uL (ref 0.0–0.7)
Eosinophils Relative: 0 % (ref 0–5)
Lymphs Abs: 2.1 10*3/uL (ref 0.7–4.0)

## 2010-12-24 LAB — CBC
Hemoglobin: 10.6 g/dL — ABNORMAL LOW (ref 12.0–15.0)
RBC: 3.54 MIL/uL — ABNORMAL LOW (ref 3.87–5.11)

## 2010-12-24 LAB — MAGNESIUM: Magnesium: 1.8 mg/dL (ref 1.5–2.5)

## 2010-12-24 LAB — LIPASE, BLOOD: Lipase: 22 U/L (ref 11–59)

## 2010-12-24 MED ORDER — ENOXAPARIN SODIUM 40 MG/0.4ML ~~LOC~~ SOLN
40.0000 mg | SUBCUTANEOUS | Status: DC
  Administered 2010-12-24: 40 mg via SUBCUTANEOUS
  Filled 2010-12-24: qty 0.4

## 2010-12-24 MED ORDER — ZOLPIDEM TARTRATE 5 MG PO TABS
10.0000 mg | ORAL_TABLET | Freq: Once | ORAL | Status: AC
  Administered 2010-12-24: 10 mg via ORAL
  Filled 2010-12-24: qty 2

## 2010-12-24 MED ORDER — POTASSIUM CHLORIDE 10 MEQ/100ML IV SOLN
INTRAVENOUS | Status: AC
  Administered 2010-12-24: 10 meq
  Filled 2010-12-24: qty 100

## 2010-12-24 MED ORDER — ACETAMINOPHEN 650 MG RE SUPP
650.0000 mg | Freq: Four times a day (QID) | RECTAL | Status: DC | PRN
  Filled 2010-12-24: qty 1

## 2010-12-24 MED ORDER — PANTOPRAZOLE SODIUM 40 MG IV SOLR
40.0000 mg | Freq: Two times a day (BID) | INTRAVENOUS | Status: DC
  Administered 2010-12-24 – 2010-12-25 (×3): 40 mg via INTRAVENOUS
  Filled 2010-12-24 (×3): qty 40

## 2010-12-24 MED ORDER — DICYCLOMINE HCL 10 MG PO CAPS
20.0000 mg | ORAL_CAPSULE | Freq: Three times a day (TID) | ORAL | Status: DC
  Administered 2010-12-24 – 2011-01-01 (×24): 20 mg via ORAL
  Filled 2010-12-24 (×17): qty 2
  Filled 2010-12-24: qty 1
  Filled 2010-12-24 (×7): qty 2
  Filled 2010-12-24: qty 1

## 2010-12-24 MED ORDER — BUDESONIDE-FORMOTEROL FUMARATE 80-4.5 MCG/ACT IN AERO
2.0000 | INHALATION_SPRAY | Freq: Two times a day (BID) | RESPIRATORY_TRACT | Status: DC
  Administered 2010-12-24 – 2011-01-02 (×17): 2 via RESPIRATORY_TRACT
  Filled 2010-12-24: qty 6.9

## 2010-12-24 MED ORDER — MORPHINE SULFATE 2 MG/ML IJ SOLN
1.0000 mg | INTRAMUSCULAR | Status: DC | PRN
  Administered 2010-12-24 – 2010-12-27 (×12): 2 mg via INTRAVENOUS
  Filled 2010-12-24 (×13): qty 1

## 2010-12-24 MED ORDER — DULOXETINE HCL 30 MG PO CPEP
30.0000 mg | ORAL_CAPSULE | Freq: Every day | ORAL | Status: DC
  Administered 2010-12-24 – 2011-01-02 (×10): 30 mg via ORAL
  Filled 2010-12-24 (×13): qty 1

## 2010-12-24 MED ORDER — KCL IN DEXTROSE-NACL 40-5-0.9 MEQ/L-%-% IV SOLN
INTRAVENOUS | Status: DC
  Administered 2010-12-24 – 2010-12-25 (×3): via INTRAVENOUS
  Administered 2010-12-26: 1000 mL via INTRAVENOUS
  Administered 2010-12-26 – 2010-12-28 (×3): via INTRAVENOUS
  Administered 2010-12-28: 1000 mL via INTRAVENOUS
  Administered 2010-12-29: 02:00:00 via INTRAVENOUS
  Filled 2010-12-24 (×10): qty 1000

## 2010-12-24 MED ORDER — ALBUTEROL SULFATE HFA 108 (90 BASE) MCG/ACT IN AERS
2.0000 | INHALATION_SPRAY | Freq: Four times a day (QID) | RESPIRATORY_TRACT | Status: DC | PRN
  Administered 2010-12-24 – 2011-01-02 (×13): 2 via RESPIRATORY_TRACT
  Filled 2010-12-24: qty 6.7

## 2010-12-24 MED ORDER — FAMOTIDINE IN NACL 20-0.9 MG/50ML-% IV SOLN
20.0000 mg | Freq: Two times a day (BID) | INTRAVENOUS | Status: DC
  Administered 2010-12-24 (×2): 20 mg via INTRAVENOUS
  Filled 2010-12-24 (×3): qty 50

## 2010-12-24 MED ORDER — ONDANSETRON HCL 4 MG/2ML IJ SOLN
4.0000 mg | Freq: Four times a day (QID) | INTRAMUSCULAR | Status: DC | PRN
  Administered 2010-12-24 – 2010-12-25 (×2): 4 mg via INTRAVENOUS
  Filled 2010-12-24 (×4): qty 2

## 2010-12-24 MED ORDER — ACETAMINOPHEN 325 MG PO TABS: 650.0000 mg | ORAL_TABLET | Freq: Four times a day (QID) | ORAL | Status: DC | PRN

## 2010-12-24 MED ORDER — METOCLOPRAMIDE HCL 10 MG PO TABS
10.0000 mg | ORAL_TABLET | Freq: Four times a day (QID) | ORAL | Status: DC
  Administered 2010-12-24 – 2010-12-25 (×3): 10 mg via ORAL
  Filled 2010-12-24 (×4): qty 1

## 2010-12-24 MED ORDER — POTASSIUM CHLORIDE 10 MEQ/100ML IV SOLN
10.0000 meq | INTRAVENOUS | Status: AC
Start: 2010-12-24 — End: 2010-12-25
  Administered 2010-12-24 – 2010-12-25 (×4): 10 meq via INTRAVENOUS
  Filled 2010-12-24 (×3): qty 100

## 2010-12-24 MED ORDER — HYDROCODONE-ACETAMINOPHEN 5-325 MG PO TABS
1.0000 | ORAL_TABLET | ORAL | Status: DC | PRN
  Administered 2010-12-27 (×2): 2 via ORAL
  Administered 2010-12-27: 1 via ORAL
  Administered 2010-12-28 – 2010-12-29 (×8): 2 via ORAL
  Administered 2010-12-30 – 2010-12-31 (×5): 1 via ORAL
  Administered 2010-12-31 (×2): 2 via ORAL
  Administered 2010-12-31 (×2): 1 via ORAL
  Administered 2011-01-01 – 2011-01-02 (×6): 2 via ORAL
  Filled 2010-12-24: qty 2
  Filled 2010-12-24: qty 1
  Filled 2010-12-24 (×7): qty 2
  Filled 2010-12-24: qty 1
  Filled 2010-12-24 (×3): qty 2
  Filled 2010-12-24: qty 1
  Filled 2010-12-24 (×3): qty 2
  Filled 2010-12-24 (×5): qty 1
  Filled 2010-12-24 (×3): qty 2
  Filled 2010-12-24 (×2): qty 1

## 2010-12-24 NOTE — Assessment & Plan Note (Signed)
Possible Mallory Weiss tear. EGD in am. Monitor H/H.

## 2010-12-24 NOTE — Progress Notes (Signed)
Cc to PCP 

## 2010-12-24 NOTE — Assessment & Plan Note (Signed)
Several month history of diarrhea, previous history of chronic constipation. Will need further workup. The patient did not keep her appointment for colonoscopy in 2011. Consider collecting stools while inpatient if she has recurrent diarrhea otherwise would pursue outpatient workup.

## 2010-12-24 NOTE — H&P (Signed)
PCP:  Syliva Overman, MD, MD   DOA:  12/24/2010 12:15 PM  Chief Complaint:  vomiting  HPI: This is a 30 y/o female who was directly admitted from the gastroenterologists office for intractable nausea and vomiting.  The patient reports that her symptoms began on Thursday, which was approximately one week ago. She had the onset of her vomiting as well as epigastric pain. She had come to the emergency room for evaluation had some relief with Reglan and was sent home. A CT scan had been done at that time which was unremarkable for any acute pathology. She had recurrence of her symptoms and had to come back to the emergency room again. She was set up for an outpatient appointment with the gastroenterology group. Patient presented to her outpatient appointment today was noted to have intractable nausea and vomiting. It was also noted that she did have some small amount of blood after she had vomited several times. She reports having some diarrhea but has not had any episodes since Thursday. She denies any fever or cough. She does have some shortness of breath when her abdominal pain acts up. The patient was also noted to be hypokalemic from her persistent vomiting. She is admitted to the hospital for further workup.  Allergies: Allergies  Allergen Reactions  . Penicillins     unknown    Prior to Admission medications   Medication Sig Start Date End Date Taking? Authorizing Provider  albuterol (PROVENTIL HFA;VENTOLIN HFA) 108 (90 BASE) MCG/ACT inhaler Inhale 2 puffs into the lungs every 6 (six) hours as needed. Shortness of breath  08/21/10 08/21/11  Syliva Overman, MD  budesonide-formoterol [facility]) 80-4.5 MCG/ACT inhaler Inhale 2 puffs into the lungs 2 (two) times daily. 08/21/10 08/21/11  Syliva Overman, MD  dicyclomine (BENTYL) 20 MG tablet Take 1 tablet (20 mg total) by mouth 2 (two) times daily. 12/18/10 12/18/11  Kathlene November Schinlever  DULoxetine (CYMBALTA) 30 MG capsule Take 1 capsule (30 mg total)  by mouth daily. 08/21/10 08/21/11  Syliva Overman, MD  HYDROcodone-acetaminophen (VICODIN) 5-500 MG per tablet Take 1-2 tablets by mouth every 6 (six) hours as needed for pain. 12/18/10 12/28/10  Kathlene November Schinlever  metoCLOPramide (REGLAN) 10 MG tablet Take 1 tablet (10 mg total) by mouth every 6 (six) hours. As needed for nausea and vomiting. 12/23/10 01/02/11  Grant Fontana, PA  omeprazole (PRILOSEC) 20 MG capsule Take 1 capsule (20 mg total) by mouth daily. 11/03/10 11/03/11  Syliva Overman, MD  ranitidine (ZANTAC) 150 MG capsule Take 1 capsule (150 mg total) by mouth 2 (two) times daily. 12/18/10 12/18/11  Milus Glazier    Past Medical History  Diagnosis Date  . Asthma   . Ulcer of abdomen wall     ???  . Migraines   . Osteoporosis   . Depression   . Seasonal allergies   . Sinusitis   . Back pain, chronic   . Nicotine addiction   . Constipation   . Vitamin D deficiency     Past Surgical History  Procedure Date  . Cholecystectomy     ?2002  . Laser surgery on cervix   . Carpal tunnel release     left hand    Social History:  reports that she has quit smoking. Her smoking use included Cigarettes. She smoked 1 pack per day. She does not have any smokeless tobacco history on file. She reports that she does not drink alcohol or use illicit drugs.  Family History  Problem Relation Age of  Onset  . Diabetes Father   . Liver disease Father     liver transplant at [facility], age 31  . Colon cancer Neg Hx   . Lung cancer Mother     Review of Systems:  Constitutional: Denies fever, chills, diaphoresis, appetite change and fatigue.  HEENT: Denies photophobia, eye pain, redness, hearing loss, ear pain, congestion, sore throat, rhinorrhea, sneezing, mouth sores, trouble swallowing, neck pain, neck stiffness and tinnitus.   Respiratory: Denies SOB, DOE, cough, chest tightness,  and wheezing.   Cardiovascular: Denies chest pain, palpitations and leg swelling.  Gastrointestinal:  Positive for nausea, vomiting, epigastric pain Genitourinary: Denies dysuria, urgency, frequency, hematuria, flank pain and difficulty urinating.  Musculoskeletal: Denies myalgias, back pain, joint swelling, arthralgias and gait problem.  Skin: Denies pallor, rash and wound.  Neurological: Denies dizziness, seizures, syncope, weakness, light-headedness, numbness and headaches.  Hematological: Denies adenopathy. Easy bruising, personal or family bleeding history  Psychiatric/Behavioral: Denies suicidal ideation, mood changes, confusion, nervousness, sleep disturbance and agitation   Physical Exam:  Filed Vitals:   12/24/10 1230 12/24/10 1324  BP: 118/73 118/73  Pulse: 63 63  Temp: 98.5 F (36.9 C) 98.5 F (36.9 C)  TempSrc: Oral Oral  Resp: 22 22  Height: 5\' 6"  (1.676 m) 5\' 6"  (1.676 m)  Weight: 62.188 kg (137 lb 1.6 oz) 62.143 kg (137 lb)  SpO2: 100% 100%    Constitutional: Vital signs reviewed.  Patient is a well-developed and well-nourished in no acute distress and cooperative with exam. Alert and oriented x3.  Head: Normocephalic and atraumatic Ear: TM normal bilaterally Mouth: no erythema or exudates, MMM Eyes: PERRL, EOMI, conjunctivae normal, No scleral icterus.  Neck: Supple, Trachea midline normal ROM, No JVD, mass, thyromegaly, or carotid bruit present.  Cardiovascular: RRR, S1 normal, S2 normal, no MRG, pulses symmetric and intact bilaterally Pulmonary/Chest: CTAB, no wheezes, rales, or rhonchi Abdominal: Soft. Tender in the epigastrium, non-distended, bowel sounds are normal, no masses, organomegaly, or guarding present.  GU: no CVA tenderness Musculoskeletal: No joint deformities, erythema, or stiffness, ROM full and no nontender Ext: no edema and no cyanosis, pulses palpable bilaterally (DP and PT) Hematology: no cervical, inginal, or axillary adenopathy.  Neurological: A&O x3, Strenght is normal and symmetric bilaterally, cranial nerve II-XII are grossly intact, no  focal motor deficit, sensory intact to light touch bilaterally.  Skin: Warm, dry and intact. No rash, cyanosis, or clubbing.  Psychiatric: Normal mood and affect. speech and behavior is normal. Judgment and thought content normal. Cognition and memory are normal.   Labs on Admission:  Results for orders placed during the hospital encounter of 12/23/10 (from the past 48 hour(s))  URINALYSIS, ROUTINE W REFLEX MICROSCOPIC     Status: Abnormal   Collection Time   12/23/10  4:28 PM      Component Value Range Comment   Color, Urine YELLOW  YELLOW     APPearance CLEAR  CLEAR     Specific Gravity, Urine 1.029  1.005 - 1.030     pH 7.0  5.0 - 8.0     Glucose, UA NEGATIVE  NEGATIVE (mg/dL)    Hgb urine dipstick NEGATIVE  NEGATIVE     Bilirubin Urine NEGATIVE  NEGATIVE     Ketones, ur >80 (*) NEGATIVE (mg/dL)    Protein, ur 30 (*) NEGATIVE (mg/dL)    Urobilinogen, UA 0.2  0.0 - 1.0 (mg/dL)    Nitrite NEGATIVE  NEGATIVE     Leukocytes, UA TRACE (*) NEGATIVE  CBC     Status: Abnormal   Collection Time   12/23/10  4:28 PM      Component Value Range Comment   WBC 10.6 (*) 4.0 - 10.5 (K/uL)    RBC 4.35  3.87 - 5.11 (MIL/uL)    Hemoglobin 12.8  12.0 - 15.0 (g/dL)    HCT 91.4  78.2 - 95.6 (%)    MCV 88.0  78.0 - 100.0 (fL)    MCH 29.4  26.0 - 34.0 (pg)    MCHC 33.4  30.0 - 36.0 (g/dL)    RDW 21.3  08.6 - 57.8 (%)    Platelets 152  150 - 400 (K/uL)   DIFFERENTIAL     Status: Abnormal   Collection Time   12/23/10  4:28 PM      Component Value Range Comment   Neutrophils Relative 80 (*) 43 - 77 (%)    Neutro Abs 8.4 (*) 1.7 - 7.7 (K/uL)    Lymphocytes Relative 12  12 - 46 (%)    Lymphs Abs 1.3  0.7 - 4.0 (K/uL)    Monocytes Relative 8  3 - 12 (%)    Monocytes Absolute 0.9  0.1 - 1.0 (K/uL)    Eosinophils Relative 0  0 - 5 (%)    Eosinophils Absolute 0.0  0.0 - 0.7 (K/uL)    Basophils Relative 0  0 - 1 (%)    Basophils Absolute 0.0  0.0 - 0.1 (K/uL)   URINE MICROSCOPIC-ADD ON     Status:  Abnormal   Collection Time   12/23/10  4:28 PM      Component Value Range Comment   Squamous Epithelial / LPF FEW (*) RARE     WBC, UA 0-2  <3 (WBC/hpf)    Bacteria, UA FEW (*) RARE     Casts HYALINE CASTS (*) NEGATIVE     Crystals CA OXALATE CRYSTALS (*) NEGATIVE     Urine-Other MUCOUS PRESENT     POCT I-STAT, CHEM 8     Status: Abnormal   Collection Time   12/23/10  4:52 PM      Component Value Range Comment   Sodium 142  135 - 145 (mEq/L)    Potassium 3.0 (*) 3.5 - 5.1 (mEq/L)    Chloride 105  96 - 112 (mEq/L)    BUN 14  6 - 23 (mg/dL)    Creatinine, Ser 4.69  0.50 - 1.10 (mg/dL)    Glucose, Bld 629 (*) 70 - 99 (mg/dL)    Calcium, Ion 5.28  1.12 - 1.32 (mmol/L)    TCO2 23  0 - 100 (mmol/L)    Hemoglobin 13.9  12.0 - 15.0 (g/dL)    HCT 41.3  24.4 - 01.0 (%)     Radiological Exams on Admission: No results found.  Assessment/Plan Principal Problem:  *Intractable vomiting Active Problems:  GERD (gastroesophageal reflux disease)  Epigastric pain  Hematemesis  Hypokalemia  Plan:  Patient will be started on IV fluids for rehydration her potassium will be repleted. Will check basic lab work including CBC chemistries as well as serum lipase. We will also start with an acute abdominal series. Patient will be given supportive treatment with antiemetics and analgesics. We will continue the patient on Protonix as well as Pepcid. Patient be followed by the gastroenterology service and tentative plans for endoscopy in the morning. She is made n.p.o. after midnight started tonight. Further orders per the clinical course  Time Spent on Admission:  Ander Wamser  12/24/2010, 2:18 PM

## 2010-12-24 NOTE — ED Provider Notes (Signed)
Evaluation and management procedures were performed by the PA/NP under my supervision/collaboration.   Dione Booze, MD 12/24/10 [id number]

## 2010-12-24 NOTE — Progress Notes (Signed)
Primary Care Physician:  Syliva Overman, MD, MD  Primary Gastroenterologist:  Roetta Sessions, MD   Chief Complaint  Patient presents with  . Abdominal Pain  . Back Pain  . Emesis    HPI:  Tracey Daniel is a 30 y.o. female here for an urgent office visit for abdominal pain, intractable vomiting. She has been seen in the emergency department at [facility] twice in the past one week. At baseline, she has been having epigastric pain for several months. She's also been having chronic diarrhea for several months. One week ago she started having severe epigastric pain, pain into the back between the shoulder blades associated with intractable vomiting. Her stomach hurts if she doesn't eat. When she eats it takes a while for it to stop hurting. Anything she tries to eat is coming back up at this point. Emesis is green with specks of red blood. She has erythematous oropharynx he complains of throat and mouth pain after all the vomiting. Denies any ill contacts. Medicine (Zantac and Prilosec) helps for a little while. Just started back on meds this year.  Back pain started after all the vomiting. Single episode of dark yellow urine today. Several month h/o 2-3 loose stools daily, mucous, no blood. One episode of melena, three days ago. Treated for H. pylori in October 2012 based on positive serologies.  CT of the abdomen and pelvis with contrast on 12/18/2010 was unremarkable. On November 29 her white blood cell count was 15,500. Last night was 10,600. Potassium 3.0. Urine pregnancy test negative.  Current Outpatient Prescriptions  Medication Sig Dispense Refill  . albuterol (PROVENTIL HFA;VENTOLIN HFA) 108 (90 BASE) MCG/ACT inhaler Inhale 2 puffs into the lungs every 6 (six) hours as needed. Shortness of breath       . budesonide-formoterol (SYMBICORT) 80-4.5 MCG/ACT inhaler Inhale 2 puffs into the lungs 2 (two) times daily.  1 Inhaler  12  . dicyclomine (BENTYL) 20 MG tablet Take 1 tablet (20 mg  total) by mouth 2 (two) times daily.  20 tablet  0  . DULoxetine (CYMBALTA) 30 MG capsule Take 1 capsule (30 mg total) by mouth daily.  30 capsule  2  . HYDROcodone-acetaminophen (VICODIN) 5-500 MG per tablet Take 1-2 tablets by mouth every 6 (six) hours as needed for pain.  15 tablet  0  . metoCLOPramide (REGLAN) 10 MG tablet Take 1 tablet (10 mg total) by mouth every 6 (six) hours. As needed for nausea and vomiting.  30 tablet  0  . omeprazole (PRILOSEC) 20 MG capsule Take 1 capsule (20 mg total) by mouth daily.  14 capsule  0  . ranitidine (ZANTAC) 150 MG capsule Take 1 capsule (150 mg total) by mouth 2 (two) times daily.  30 capsule  0   No current facility-administered medications for this visit.   Facility-Administered Medications Ordered in Other Visits  Medication Dose Route Frequency Provider Last Rate Last Dose  . metoCLOPramide (REGLAN) injection 10 mg  10 mg Intravenous Once Grant Fontana, [place]   10 mg at 12/23/10 1757  . morphine 4 MG/ML injection 4 mg  4 mg Intravenous Once Grant Fontana, [place]   4 mg at 12/23/10 1651  . ondansetron (ZOFRAN) injection 4 mg  4 mg Intravenous Once Grant Fontana, [place]   4 mg at 12/23/10 1651  . potassium chloride SA (K-DUR,KLOR-CON) CR tablet 40 mEq  40 mEq Oral Once Grant Fontana, [place]   40 mEq at 12/23/10 1904  . sodium chloride 0.9 %  bolus 1,000 mL  1,000 mL Intravenous Once Grant Fontana, PA   1,000 mL at 12/23/10 1649  . sodium chloride 0.9 % bolus 1,000 mL  1,000 mL Intravenous Once Grant Fontana, PA   1,000 mL at 12/23/10 1758    Allergies as of 12/24/2010 - Review Complete 12/24/2010  Allergen Reaction Noted  . Penicillins      Past Medical History  Diagnosis Date  . Asthma   . Ulcer of abdomen wall     ???  . Migraines   . Osteoporosis   . Depression   . Seasonal allergies   . Sinusitis   . Back pain, chronic   . Nicotine addiction   . Constipation   . Vitamin D deficiency     Past Surgical History    Procedure Date  . Cholecystectomy     ?2002  . Laser surgery on cervix   . Carpal tunnel release     left hand    Family History  Problem Relation Age of Onset  . Diabetes Father   . Colon cancer Neg Hx   . Liver disease Father     liver transplant at [facility], age 70    History   Social History  . Marital Status: Single    Spouse Name: N/A    Number of Children: 0  . Years of Education: N/A   Occupational History  . disability    Social History Main Topics  . Smoking status: Former Smoker -- 1.0 packs/day    Types: Cigarettes  . Smokeless tobacco: Not on file   Comment: quit 2011  . Alcohol Use: No     none since July. Periodic heavy drinker for months at a time.  . Drug Use: No  . Sexually Active: Not on file   Other Topics Concern  . Not on file   Social History Narrative  . No narrative on file      ROS:  General: Negative for fever, chills, fatigue, weakness. Eyes: Negative for vision changes.  ENT: Negative for hoarseness, difficulty swallowing , nasal congestion. Complains of mouth and throat pain since vomiting. CV: Negative for chest pain, angina, palpitations, dyspnea on exertion, peripheral edema.  Respiratory: Complains of increased abdominal pain with deep breath. Using inhaler more frequently. Negative for cough, sputum, wheezing.  GI: See history of present illness. GU:  Negative for dysuria, hematuria, urinary incontinence, urinary frequency, nocturnal urination. Complains of decreased urination. MS: Negative for joint pain, low back pain. Complains of pain between the shoulder blades.  Derm: Negative for rash or itching.  Neuro: Negative for weakness, abnormal sensation, seizure, frequent headaches, memory loss, confusion.  Psych: Negative for anxiety, depression, suicidal ideation, hallucinations.  Endo: Negative for unusual weight change.  Heme: Negative for bruising or bleeding. Allergy: Negative for rash or hives.    Physical  Examination:  BP 123/76  Pulse 66  Temp(Src) 99.1 F (37.3 C) (Temporal)  Ht 5\' 6"  (1.676 m)   General: Acutely ill-appearing black female accompanied by her fianc. No acute distress.  Head: Normocephalic, atraumatic.   Eyes: Conjunctiva pink, no icterus. Mouth: Oropharyngeal mucosa moist and pink. Injected oral pharynx. Neck: Supple without thyromegaly, masses, or lymphadenopathy.  Lungs: Clear to auscultation bilaterally.  Heart: Regular rate and rhythm, no murmurs rubs or gallops.  Abdomen: Bowel sounds are normal, severe epigastric tenderness with subjective guarding, nondistended, no hepatosplenomegaly or masses, no abdominal bruits or    hernia , no rebound.   Rectal: Not performed. Extremities:  No lower extremity edema. No clubbing or deformities.  Neuro: Alert and oriented x 4 , grossly normal neurologically. Crying here Skin: Warm and dry, no rash or jaundice.   Psych: Alert and cooperative, normal mood and affect.  Labs: Lab Results  Component Value Date   WBC 10.6* 12/23/2010   HGB 13.9 12/23/2010   HCT 41.0 12/23/2010   MCV 88.0 12/23/2010   PLT 152 12/23/2010    Lab Results  Component Value Date   LIPASE 21 12/18/2010   Lab Results  Component Value Date   CREATININE 1.00 12/23/2010   BUN 14 12/23/2010   NA 142 12/23/2010   K 3.0* 12/23/2010   CL 105 12/23/2010   CO2 21 07/29/2010     Imaging Studies: Ct Abdomen Pelvis W Contrast  12/18/2010  *RADIOLOGY REPORT*  Clinical Data: Epigastric pain  CT ABDOMEN AND PELVIS WITH CONTRAST  Technique:  Multidetector CT imaging of the abdomen and pelvis was performed following the standard protocol during bolus administration of intravenous contrast.  Contrast: OMNIPAQUE IOHEXOL 300 MG/ML IV SOLN  Comparison: 07/29/2010  Findings: Post cholecystectomy.  Liver, spleen, pancreas, adrenal glands are within normal limits.  Stable small cyst in the left kidney.  Unremarkable right kidney.  No free fluid.  No abnormal  adenopathy.  Normal appendix.  No destructive bone lesion.  IMPRESSION: No acute pathology.  Original Report Authenticated By: Donavan Burnet, M.D.

## 2010-12-24 NOTE — Assessment & Plan Note (Signed)
Acute on chronic epigastric pain. Intensified 6-7 days ago. Now associated with intractable vomiting, hematemesis, single episode of melena 3 days ago. She had CT scan 6 days ago which was unremarkable. White blood cell count was 15,000, yesterday 10,000. Bilious emesis in the office with specks of red blood. Discussed with Dr. Darrick Penna and Dr. Darnelle Spangle. Patient to be directly admitted for supportive treatment and plans for upper endoscopy tomorrow. She has failed outpatient treatment at this point with 2 ED visits in the last one week.  Treated for H. pylori (positive serologies) in October 2012.

## 2010-12-24 NOTE — Patient Instructions (Signed)
Please go to the hospital for direct admission.

## 2010-12-24 NOTE — Progress Notes (Signed)
UR Chart Review Completed  

## 2010-12-25 ENCOUNTER — Encounter (HOSPITAL_COMMUNITY): Payer: Self-pay | Admitting: *Deleted

## 2010-12-25 ENCOUNTER — Encounter (HOSPITAL_COMMUNITY)
Admission: AD | Disposition: A | Discharge status: Home / Self Care | Payer: Self-pay | Source: Ambulatory Visit | Attending: Internal Medicine

## 2010-12-25 ENCOUNTER — Other Ambulatory Visit: Payer: Self-pay | Admitting: Gastroenterology

## 2010-12-25 DIAGNOSIS — K2970 Gastritis, unspecified, without bleeding: Secondary | ICD-10-CM

## 2010-12-25 DIAGNOSIS — R197 Diarrhea, unspecified: Secondary | ICD-10-CM

## 2010-12-25 DIAGNOSIS — R112 Nausea with vomiting, unspecified: Secondary | ICD-10-CM

## 2010-12-25 DIAGNOSIS — R109 Unspecified abdominal pain: Secondary | ICD-10-CM

## 2010-12-25 DIAGNOSIS — K2990 Gastroduodenitis, unspecified, without bleeding: Secondary | ICD-10-CM

## 2010-12-25 HISTORY — PX: ESOPHAGEAL DILATION: SHX303

## 2010-12-25 HISTORY — PX: ESOPHAGOGASTRODUODENOSCOPY: SHX5428

## 2010-12-25 LAB — CBC
Platelets: 116 10*3/uL — ABNORMAL LOW (ref 150–400)
RBC: 3.24 MIL/uL — ABNORMAL LOW (ref 3.87–5.11)
WBC: 5.7 10*3/uL (ref 4.0–10.5)

## 2010-12-25 LAB — BASIC METABOLIC PANEL
CO2: 28 mEq/L (ref 19–32)
Chloride: 106 mEq/L (ref 96–112)
Sodium: 140 mEq/L (ref 135–145)

## 2010-12-25 SURGERY — EGD (ESOPHAGOGASTRODUODENOSCOPY)
Anesthesia: Moderate Sedation

## 2010-12-25 MED ORDER — MEPERIDINE HCL 100 MG/ML IJ SOLN
INTRAMUSCULAR | Status: AC
  Filled 2010-12-25: qty 2

## 2010-12-25 MED ORDER — ONDANSETRON HCL 4 MG/2ML IJ SOLN
4.0000 mg | Freq: Four times a day (QID) | INTRAMUSCULAR | Status: DC
  Administered 2010-12-25 – 2010-12-27 (×8): 4 mg via INTRAVENOUS
  Filled 2010-12-25 (×6): qty 2

## 2010-12-25 MED ORDER — MIDAZOLAM HCL 5 MG/5ML IJ SOLN
INTRAMUSCULAR | Status: AC
  Filled 2010-12-25: qty 10

## 2010-12-25 MED ORDER — BUTAMBEN-TETRACAINE-BENZOCAINE 2-2-14 % EX AERO
INHALATION_SPRAY | CUTANEOUS | Status: DC | PRN
  Administered 2010-12-25: 1 via TOPICAL

## 2010-12-25 MED ORDER — PANTOPRAZOLE SODIUM 40 MG PO TBEC
40.0000 mg | DELAYED_RELEASE_TABLET | Freq: Two times a day (BID) | ORAL | Status: DC
  Administered 2010-12-25 – 2011-01-02 (×16): 40 mg via ORAL
  Filled 2010-12-25 (×16): qty 1

## 2010-12-25 MED ORDER — SODIUM CHLORIDE 0.9 % IJ SOLN
INTRAMUSCULAR | Status: AC
  Filled 2010-12-25: qty 3

## 2010-12-25 MED ORDER — PROMETHAZINE HCL 25 MG/ML IJ SOLN
INTRAMUSCULAR | Status: AC
  Filled 2010-12-25: qty 1

## 2010-12-25 MED ORDER — SODIUM CHLORIDE 0.9 % IJ SOLN
INTRAMUSCULAR | Status: AC
  Administered 2010-12-25: 16:00:00
  Filled 2010-12-25: qty 3

## 2010-12-25 MED ORDER — STERILE WATER FOR IRRIGATION IR SOLN
Status: DC | PRN
  Administered 2010-12-25: 11:00:00

## 2010-12-25 MED ORDER — MIDAZOLAM HCL 5 MG/5ML IJ SOLN
INTRAMUSCULAR | Status: DC | PRN
  Administered 2010-12-25: 1 mg via INTRAVENOUS
  Administered 2010-12-25 (×2): 2 mg via INTRAVENOUS

## 2010-12-25 MED ORDER — MEPERIDINE HCL 100 MG/ML IJ SOLN
INTRAMUSCULAR | Status: DC | PRN
  Administered 2010-12-25: 50 mg via INTRAVENOUS
  Administered 2010-12-25 (×3): 25 mg via INTRAVENOUS

## 2010-12-25 MED ORDER — POTASSIUM CHLORIDE 10 MEQ/100ML IV SOLN
10.0000 meq | INTRAVENOUS | Status: AC
  Administered 2010-12-25 (×6): 10 meq via INTRAVENOUS
  Filled 2010-12-25: qty 200

## 2010-12-25 NOTE — H&P (Signed)
BP Pulse Temp(Src) Ht LMP    123/76  66  99.1 F (37.3 C) (Temporal)  5\' 6"  (1.676 m)  01/19/1997       Progress Notes     Tana Coast, [place]  12/24/2010 12:01 PM  Signed Primary Care Physician:  Syliva Overman, MD, MD   Primary Gastroenterologist:  Roetta Sessions, MD      Chief Complaint   Patient presents with   .  Abdominal Pain   .  Back Pain   .  Emesis      HPI:  Tracey Daniel is a 30 y.o. female here for an urgent office visit for abdominal pain, intractable vomiting. She has been seen in the emergency department at [facility] twice in the past one week. At baseline, she has been having epigastric pain for several months. She's also been having chronic diarrhea for several months. One week ago she started having severe epigastric pain, pain into the back between the shoulder blades associated with intractable vomiting. Her stomach hurts if she doesn't eat. When she eats it takes a while for it to stop hurting. Anything she tries to eat is coming back up at this point. Emesis is green with specks of red blood. She has erythematous oropharynx he complains of throat and mouth pain after all the vomiting. Denies any ill contacts. Medicine (Zantac and Prilosec) helps for a little while. Just started back on meds this year.   Back pain started after all the vomiting. Single episode of dark yellow urine today. Several month h/o 2-3 loose stools daily, mucous, no blood. One episode of melena, three days ago. Treated for H. pylori in October 2012 based on positive serologies.   CT of the abdomen and pelvis with contrast on 12/18/2010 was unremarkable. On November 29 her white blood cell count was 15,500. Last night was 10,600. Potassium 3.0. Urine pregnancy test negative.    Current Outpatient Prescriptions   Medication  Sig  Dispense  Refill   .  albuterol (PROVENTIL HFA;VENTOLIN HFA) 108 (90 BASE) MCG/ACT inhaler  Inhale 2 puffs into the lungs every 6 (six) hours as  needed. Shortness of breath          .  budesonide-formoterol (SYMBICORT) 80-4.5 MCG/ACT inhaler  Inhale 2 puffs into the lungs 2 (two) times daily.   1 Inhaler   12   .  dicyclomine (BENTYL) 20 MG tablet  Take 1 tablet (20 mg total) by mouth 2 (two) times daily.   20 tablet   0   .  DULoxetine (CYMBALTA) 30 MG capsule  Take 1 capsule (30 mg total) by mouth daily.   30 capsule   2   .  HYDROcodone-acetaminophen (VICODIN) 5-500 MG per tablet  Take 1-2 tablets by mouth every 6 (six) hours as needed for pain.   15 tablet   0   .  metoCLOPramide (REGLAN) 10 MG tablet  Take 1 tablet (10 mg total) by mouth every 6 (six) hours. As needed for nausea and vomiting.   30 tablet   0   .  omeprazole (PRILOSEC) 20 MG capsule  Take 1 capsule (20 mg total) by mouth daily.   14 capsule   0   .  ranitidine (ZANTAC) 150 MG capsule  Take 1 capsule (150 mg total) by mouth 2 (two) times daily.   30 capsule   0       No current facility-administered medications for this visit.  Facility-Administered Medications Ordered in Other Visits   Medication  Dose  Route  Frequency  Provider  Last Rate  Last Dose   .  metoCLOPramide (REGLAN) injection 10 mg   10 mg  Intravenous  Once  Grant Fontana, [place]     10 mg at 12/23/10 1757   .  morphine 4 MG/ML injection 4 mg   4 mg  Intravenous  Once  Grant Fontana, [place]     4 mg at 12/23/10 1651   .  ondansetron (ZOFRAN) injection 4 mg   4 mg  Intravenous  Once  Grant Fontana, [place]     4 mg at 12/23/10 1651   .  potassium chloride SA (K-DUR,KLOR-CON) CR tablet 40 mEq   40 mEq  Oral  Once  Grant Fontana, [place]     40 mEq at 12/23/10 1904   .  sodium chloride 0.9 % bolus 1,000 mL   1,000 mL  Intravenous  Once  Grant Fontana, [place]     1,000 mL at 12/23/10 1649   .  sodium chloride 0.9 % bolus 1,000 mL   1,000 mL  Intravenous  Once  Grant Fontana, PA     1,000 mL at 12/23/10 1758       Allergies as of 12/24/2010 - Review Complete 12/24/2010   Allergen  Reaction   Noted   .  Penicillins           Past Medical History   Diagnosis  Date   .  Asthma     .  Ulcer of abdomen wall         ???   .  Migraines     .  Osteoporosis     .  Depression     .  Seasonal allergies     .  Sinusitis     .  Back pain, chronic     .  Nicotine addiction     .  Constipation     .  Vitamin D deficiency         Past Surgical History   Procedure  Date   .  Cholecystectomy         ?2002   .  Laser surgery on cervix     .  Carpal tunnel release         left hand       Family History   Problem  Relation  Age of Onset   .  Diabetes  Father     .  Colon cancer  Neg Hx     .  Liver disease  Father         liver transplant at [facility], age 71       History       Social History   .  Marital Status:  Single       Spouse Name:  N/A       Number of Children:  0   .  Years of Education:  N/A       Occupational History   .  disability         Social History Main Topics   .  Smoking status:  Former Smoker -- 1.0 packs/day       Types:  Cigarettes   .  Smokeless tobacco:  Not on file     Comment: quit 2011   .  Alcohol Use:  No         none since July. Periodic heavy  drinker for months at a time.   .  Drug Use:  No   .  Sexually Active:  Not on file       Other Topics  Concern   .  Not on file       Social History Narrative   .  No narrative on file        ROS:   General: Negative for fever, chills, fatigue, weakness. Eyes: Negative for vision changes.   ENT: Negative for hoarseness, difficulty swallowing , nasal congestion. Complains of mouth and throat pain since vomiting. CV: Negative for chest pain, angina, palpitations, dyspnea on exertion, peripheral edema.   Respiratory: Complains of increased abdominal pain with deep breath. Using inhaler more frequently. Negative for cough, sputum, wheezing.   GI: See history of present illness. GU:  Negative for dysuria, hematuria, urinary incontinence, urinary frequency, nocturnal urination.  Complains of decreased urination. MS: Negative for joint pain, low back pain. Complains of pain between the shoulder blades.   Derm: Negative for rash or itching.   Neuro: Negative for weakness, abnormal sensation, seizure, frequent headaches, memory loss, confusion.   Psych: Negative for anxiety, depression, suicidal ideation, hallucinations.   Endo: Negative for unusual weight change.   Heme: Negative for bruising or bleeding. Allergy: Negative for rash or hives.     Physical Examination:   BP 123/76  Pulse 66  Temp(Src) 99.1 F (37.3 C) (Temporal)  Ht 5\' 6"  (1.676 m)    General: Acutely ill-appearing black female accompanied by her fianc. No acute distress.   Head: Normocephalic, atraumatic.    Eyes: Conjunctiva pink, no icterus. Mouth: Oropharyngeal mucosa moist and pink. Injected oral pharynx. Neck: Supple without thyromegaly, masses, or lymphadenopathy.   Lungs: Clear to auscultation bilaterally.   Heart: Regular rate and rhythm, no murmurs rubs or gallops.   Abdomen: Bowel sounds are normal, severe epigastric tenderness with subjective guarding, nondistended, no hepatosplenomegaly or masses, no abdominal bruits or    hernia , no rebound.    Rectal: Not performed. Extremities: No lower extremity edema. No clubbing or deformities.   Neuro: Alert and oriented x 4 , grossly normal neurologically. Crying here Skin: Warm and dry, no rash or jaundice.    Psych: Alert and cooperative, normal mood and affect.   Labs: Lab Results   Component  Value  Date     WBC  10.6*  12/23/2010     HGB  13.9  12/23/2010     HCT  41.0  12/23/2010     MCV  88.0  12/23/2010     PLT  152  12/23/2010      Lab Results   Component  Value  Date     LIPASE  21  12/18/2010    Lab Results   Component  Value  Date     CREATININE  1.00  12/23/2010     BUN  14  12/23/2010     NA  142  12/23/2010     K  3.0*  12/23/2010     CL  105  12/23/2010     CO2  21  07/29/2010        Imaging Studies: Ct  Abdomen Pelvis W Contrast   12/18/2010  *RADIOLOGY REPORT*  Clinical Data: Epigastric pain  CT ABDOMEN AND PELVIS WITH CONTRAST  Technique:  Multidetector CT imaging of the abdomen and pelvis was performed following the standard protocol during bolus administration of intravenous contrast.  Contrast: OMNIPAQUE IOHEXOL 300 MG/ML  IV SOLN  Comparison: 07/29/2010  Findings: Post cholecystectomy.  Liver, spleen, pancreas, adrenal glands are within normal limits.  Stable small cyst in the left kidney.  Unremarkable right kidney.  No free fluid.  No abnormal adenopathy.  Normal appendix.  No destructive bone lesion.  IMPRESSION: No acute pathology.  Original Report Authenticated By: Donavan Burnet, M.D.            Glendora Score  12/24/2010  4:47 PM  Signed Cc to PCP     Epigastric pain - Tana Coast, [place]  12/24/2010 11:51 AM  Signed Acute on chronic epigastric pain. Intensified 6-7 days ago. Now associated with intractable vomiting, hematemesis, single episode of melena 3 days ago. She had CT scan 6 days ago which was unremarkable. White blood cell count was 15,000, yesterday 10,000. Bilious emesis in the office with specks of red blood. Discussed with Dr. Darrick Penna and Dr. Darnelle Spangle. Patient to be directly admitted for supportive treatment and plans for upper endoscopy tomorrow. She has failed outpatient treatment at this point with 2 ED visits in the last one week.   Treated for H. pylori (positive serologies) in October 2012.    Chronic diarrhea - Tana Coast, [place]  12/24/2010 11:52 AM  Signed Several month history of diarrhea, previous history of chronic constipation. Will need further workup. The patient did not keep her appointment for colonoscopy in 2011. Consider collecting stools while inpatient if she has recurrent diarrhea otherwise would pursue outpatient workup.  Hematemesis - Tana Coast, [place]  12/24/2010 11:53 AM  Signed Possible Mallory Weiss tear. EGD in am. Monitor H/H.

## 2010-12-25 NOTE — Interval H&P Note (Signed)
History and Physical Interval Note:  12/25/2010 10:19 AM  Tracey Daniel  has presented today for surgery, with the diagnosis of N/V  The various methods of treatment have been discussed with the patient and family. After consideration of risks, benefits and other options for treatment, the patient has consented to  Procedure(s): ESOPHAGOGASTRODUODENOSCOPY (EGD) as a surgical intervention .  The patients' history has been reviewed, patient examined, no change in status, stable for surgery.  I have reviewed the patients' chart and labs.  Questions were answered to the patient's satisfaction.     [employer]

## 2010-12-25 NOTE — Progress Notes (Signed)
Subjective: Nausea is improving with meds, tolerating liquids. Abd pain tolerable with meds. Objective:  Vital signs in last 24 hours:  Filed Vitals:   12/25/10 1105 12/25/10 1110 12/25/10 1115 12/25/10 1315  BP: 132/81 134/82 122/91 124/75  Pulse: 66 76 77 60  Temp:    98.1 F (36.7 C)  TempSrc:    Oral  Resp: 14 15 17 18   Height:      Weight:      SpO2: 100% 100% 100% 100%    Intake/Output from previous day:   Intake/Output Summary (Last 24 hours) at 12/25/10 1816 Last data filed at 12/25/10 1300  Gross per 24 hour  Intake 2276.75 ml  Output    725 ml  Net 1551.75 ml    Physical Exam: General: Alert, awake, oriented x3, in no acute distress. HEENT: No bruits, no goiter. Moist mucous membranes, no scleral icterus, no conjunctival pallor. Heart: Regular rate and rhythm, without murmurs, rubs, gallops. Lungs: Clear to auscultation bilaterally. No wheezing, no rhonchi, no rales.  Abdomen: Soft, epigastric tenderness is present, nondistended, positive bowel sounds. Extremities: No clubbing cyanosis or edema,  positive pedal pulses. Neuro: Grossly intact, nonfocal.    Lab Results:  Basic Metabolic Panel:    Component Value Date/Time   NA 140 12/25/2010 0810   K 3.7 12/25/2010 0810   CL 106 12/25/2010 0810   CO2 28 12/25/2010 0810   BUN 8 12/25/2010 0810   CREATININE 0.87 12/25/2010 0810   GLUCOSE 88 12/25/2010 0810   CALCIUM 9.0 12/25/2010 0810   CBC:    Component Value Date/Time   WBC 5.7 12/25/2010 0810   HGB 9.8* 12/25/2010 0810   HCT 29.1* 12/25/2010 0810   PLT 116* 12/25/2010 0810   MCV 89.8 12/25/2010 0810   NEUTROABS 4.2 12/24/2010 1459   LYMPHSABS 2.1 12/24/2010 1459   MONOABS 1.3* 12/24/2010 1459   EOSABS 0.0 12/24/2010 1459   BASOSABS 0.0 12/24/2010 1459      Lab 12/25/10 0810 12/24/10 1459 12/23/10 1652 12/23/10 1628 12/18/10 1856  WBC 5.7 7.6 -- 10.6* --  HGB 9.8* 10.6* 13.9 12.8 12.9  HCT 29.1* 30.9* 41.0 38.3 38.0  PLT 116* 135* -- 152 --  MCV 89.8  87.3 -- 88.0 --  MCH 30.2 29.9 -- 29.4 --  MCHC 33.7 34.3 -- 33.4 --  RDW 13.4 13.0 -- 12.9 --  LYMPHSABS -- 2.1 -- 1.3 --  MONOABS -- 1.3* -- 0.9 --  EOSABS -- 0.0 -- 0.0 --  BASOSABS -- 0.0 -- 0.0 --  BANDABS -- -- -- -- --    Lab 12/25/10 0810 12/24/10 1506 12/24/10 1459 12/23/10 1652 12/18/10 1856  NA 140 -- 142 142 142  K 3.7 -- 2.6* 3.0* 3.4*  CL 106 -- 103 105 107  CO2 28 -- 28 -- --  GLUCOSE 88 -- 91 127* 150*  BUN 8 -- 10 14 15   CREATININE 0.87 -- 0.70 1.00 0.80  CALCIUM 9.0 -- 9.8 -- --  MG -- 1.8 -- -- --   No results found for this basename: INR:5,PROTIME:5 in the last 168 hours Cardiac markers: No results found for this basename: CK:3,CKMB:3,TROPONINI:3,MYOGLOBIN:3 in the last 168 hours No results found for this basename: POCBNP:3 in the last 168 hours Recent Results (from the past 240 hour(s))  URINE CULTURE     Status: Normal   Collection Time   12/23/10  4:28 PM      Component Value Range Status Comment   Specimen Description URINE, CLEAN  CATCH   Final    Special Requests NONE   Final    Setup Time [id number]   Final    Colony Count 10,000 COLONIES/ML   Final    Culture     Final    Value: Multiple bacterial morphotypes present, none predominant. Suggest appropriate recollection if clinically indicated.   Report Status 12/24/2010 FINAL   Final     Studies/Results: Dg Abd Acute W/chest  12/24/2010  *RADIOLOGY REPORT*  Clinical Data: Epigastric pain, vomiting  ACUTE ABDOMEN SERIES (ABDOMEN 2 VIEW & CHEST 1 VIEW)  Comparison: CT abdomen pelvis of 12/18/2010 and chest x-ray of 02/08/2009  Findings: The lungs are clear.  Mediastinal contours appear stable. The heart is within normal limits in size.  There is a thoracolumbar scoliosis present.  Supine and erect views of the abdomen show no bowel obstruction. There are a few scattered air-fluid levels in the right lower quadrant of questionable significance.  A local inflammatory process cannot be excluded.  No free  air is noted on the erect view.  Surgical clips are present in the right upper quadrant from prior cholecystectomy.  IMPRESSION:  1.  No active lung disease. 2.  No bowel obstruction or free air.  Scattered air-fluid levels in the right lower quadrant.  Cannot exclude a local inflammatory process. 3.  Thoracolumbar scoliosis.  Original Report Authenticated By: Juline Patch, M.D.    Medications: Scheduled Meds:   . budesonide-formoterol  2 puff Inhalation BID  . dicyclomine  20 mg Oral TID AC  . DULoxetine  30 mg Oral Daily  . meperidine      . midazolam      . ondansetron  4 mg Intravenous QID  . pantoprazole  40 mg Oral BID AC  . potassium chloride  10 mEq Intravenous Q1 Hr x 4  . potassium chloride  10 mEq Intravenous Q1 Hr x 6  . potassium chloride      . sodium chloride      . sodium chloride      . zolpidem  10 mg Oral Once  . DISCONTD: enoxaparin  40 mg Subcutaneous Q24H  . DISCONTD: famotidine (PEPCID) IV  20 mg Intravenous Q12H  . DISCONTD: metoCLOPramide  10 mg Oral Q6H  . DISCONTD: pantoprazole (PROTONIX) IV  40 mg Intravenous Q12H   Continuous Infusions:   . dextrose 5 % and 0.9 % NaCl with KCl 40 mEq/L 75 mL/hr at 12/25/10 0644   PRN Meds:.acetaminophen, acetaminophen, albuterol, butamben-tetracaine-benzocaine, HYDROcodone-acetaminophen, meperidine, midazolam, morphine injection, ondansetron (ZOFRAN) IV, simethicone susp in sterile water 1000 mL irrigation  Assessment/Plan:  Principal Problem:  *Intractable vomiting:  Appreciate Dr. Darrick Penna assistance.  EGD shows moderate gastritis and possible GOO.  Patient for GES tomorrow. Continue clear liquids, npo after midnight. Active Problems:  GERD (gastroesophageal reflux disease), on ppi  Epigastric pain  Hematemesis  Hypokalemia, improved with replacement    LOS: 1 day   MEMON,JEHANZEB 12/25/2010, 6:16 PM

## 2010-12-25 NOTE — Op Note (Addendum)
[facility] [address] [place], [place]  [address]  ENDOSCOPY PROCEDURE REPORT  PATIENT:  Tracey, Daniel  MR#:  [id number] BIRTHDATE:  21-Jun-1980, 30 yrs. old  GENDER:  female  ENDOSCOPIST:  Jonette Eva, MD Referred by:  Syliva Overman, M.D.  PROCEDURE DATE:  12/25/2010 PROCEDURE:  EGD with biopsy, [address], EGD with PYLORIC CHANNEL balloon dilatation-REUQIRED PEDIATRic COLONOSCOPE TO EXAM THE DUODENUM  ASA CLASS: INDICATIONS:  VOMTIING, ABDOMINAL PAIN, DIARRHEA  MEDICATIONS:   Demerol 125 mg IV, Versed 5 mg IV TOPICAL ANESTHETIC:  Cetacaine Spray  DESCRIPTION OF PROCEDURE:     Physical exam was performed. Informed consent was obtained from the patient after explaining the benefits, risks, and alternatives to the procedure.  The patient was connected to the monitor and placed in the left lateral position.  Continuous oxygen was provided by nasal cannula and IV medicine administered through an indwelling cannula.  After administration of sedation, the patient's esophagus was intubated and the EG-[id number] ([id number]), EG-[id number] ([id number]) and EC-3490Li ([id number]) endoscope was advanced under direct visualization to the PYLORUS.  The scope was removed slowly by carefully examining the color, texture, anatomy, and integrity of the mucosa on the way out. TEH DIAGNOSTIC SCOPE WAS EXCHANGED FOR A PEDIATRIC COLONOSCOPE. THE endoscope was advanced under direct visualization to the 2ND PORTION OF THE DUODENUM.   The patient was recovered in endoscopy and discharged TO THE FLOOR in satisfactory condition. <<PROCEDUREIMAGES>>  UNABLE TO PASS DIAGNOSTIC GASTROSCOPE THROUGH THE PYLROUS. BALLOON PASSED W/O RESISTANCE TO THE DUODENAL BULB. PYLORUS DILATED FROM 10-12 MM-HOLDING EACH SETTING FOR 30 SECS TO 1 MINUTE. NO TEAR NOTED & STILL UNABLE TO PASS DIAGNOSTIC GASTROSCOPE THROUGH PYLORUS. Moderate gastritis was found & BIOPSIED VIA COLD FORCEPS. NL ESOPHAGUS & DUODENUM. SCOPE  EXCHANED FOR PEDS COLONOSCOPE. NL DUODENUAL BULB/DUODENUNM. BIOPSIES OBTAIVED VIA COLD FORCEPS TO EVALUATE FOR CELIAC SPRUE FOR ABDOMINAL PAIN AND DIARRHEA.  COMPLICATIONS:    None  ENDOSCOPIC IMPRESSION: 1) Moderate gastritis 2) POSSIBLE GASTRIC OUTLET OBSTRUCTION DUE TO ?PYLORSPASM 3)NAUSEA/VOMTING/ABDOMINAL PAIN 2o TO GERD, GASTRITIS, & ?GOO. NO SOURCE FOR DIARRHEA IDENTIFIED.  RECOMMENDATIONS: BID PPI GES 12/7 STOP REGLAN ZOFRAN ATC CLEAR OR FULL LIQUIDS AWAIT BIOPSIES. D/C LOVENOX  REPEAT EXAM:  No  ______________________________ Jonette Eva, MD  CC:  n. REVISED:  12/25/2010 12:29 PM eSIGNED:   Jazilyn Siegenthaler at 12/25/2010 12:29 PM  [place], Rosey Bath, [phone]

## 2010-12-26 ENCOUNTER — Inpatient Hospital Stay (HOSPITAL_COMMUNITY): Payer: Medicare Other

## 2010-12-26 DIAGNOSIS — R1110 Vomiting, unspecified: Secondary | ICD-10-CM

## 2010-12-26 LAB — CBC
Hemoglobin: 10.4 g/dL — ABNORMAL LOW (ref 12.0–15.0)
MCH: 30 pg (ref 26.0–34.0)
MCV: 90.2 fL (ref 78.0–100.0)
RBC: 3.47 MIL/uL — ABNORMAL LOW (ref 3.87–5.11)

## 2010-12-26 LAB — BASIC METABOLIC PANEL
CO2: 30 mEq/L (ref 19–32)
GFR calc non Af Amer: 83 mL/min — ABNORMAL LOW (ref >90)
Glucose, Bld: 86 mg/dL (ref 70–99)
Potassium: 3.8 mEq/L (ref 3.5–5.1)
Sodium: 139 mEq/L (ref 135–145)

## 2010-12-26 LAB — GLUCOSE, CAPILLARY: Glucose-Capillary: 90 mg/dL (ref 70–99)

## 2010-12-26 MED ORDER — PANTOPRAZOLE SODIUM 40 MG IV SOLR
40.0000 mg | Freq: Two times a day (BID) | INTRAVENOUS | Status: DC
  Administered 2010-12-26: 40 mg via INTRAVENOUS
  Filled 2010-12-26: qty 40

## 2010-12-26 MED ORDER — ONDANSETRON HCL 4 MG/2ML IJ SOLN
4.0000 mg | Freq: Four times a day (QID) | INTRAMUSCULAR | Status: DC | PRN
  Filled 2010-12-26 (×3): qty 2

## 2010-12-26 MED ORDER — METOCLOPRAMIDE HCL 5 MG/ML IJ SOLN
5.0000 mg | Freq: Three times a day (TID) | INTRAMUSCULAR | Status: DC
  Administered 2010-12-26 – 2010-12-28 (×8): 5 mg via INTRAVENOUS
  Filled 2010-12-26 (×9): qty 2

## 2010-12-26 MED ORDER — TECHNETIUM TC 99M SULFUR COLLOID
2.0000 | Freq: Once | INTRAVENOUS | Status: AC | PRN
  Administered 2010-12-26: 2 via ORAL

## 2010-12-26 MED ORDER — ONDANSETRON HCL 4 MG/2ML IJ SOLN
4.0000 mg | Freq: Four times a day (QID) | INTRAMUSCULAR | Status: DC
  Administered 2010-12-26 – 2010-12-27 (×2): 4 mg via INTRAVENOUS
  Filled 2010-12-26: qty 2

## 2010-12-26 NOTE — Progress Notes (Signed)
Subjective: Since I last evaluated the patient she tolerated clears. Got full fast while eating egg sandwich. Kept egg sandwich down. Mild abd pain.  Objective: Vital signs in last 24 hours: Temp:  [97.9 F (36.6 C)-98.2 F (36.8 C)] 98.2 F (36.8 C) (12/07 0500) Pulse Rate:  [60-63] 63  (12/07 0500) Resp:  [16-18] 16  (12/07 0500) BP: (115-130)/(72-85) 115/72 mmHg (12/07 0500) SpO2:  [99 %-100 %] 100 % (12/07 1105) Last BM Date: 12/23/10  Intake/Output from previous day: 12/06 0701 - 12/07 0700 In: 2109 [P.O.:1050; I.V.:1059] Out: 850 [Urine:850] Intake/Output this shift: Total I/O In: 0  Out: 1400 [Urine:1400]  General appearance: alert, cooperative and no distress Resp: clear to auscultation bilaterally Cardio: regular rate and rhythm GI: soft, non-tender; bowel sounds normal Lab Results:  Basename 12/26/10 0456 12/25/10 0810 12/24/10 1459  WBC 4.6 5.7 7.6  HGB 10.4* 9.8* 10.6*  HCT 31.3* 29.1* 30.9*  PLT 141* 116* 135*   BMET  Basename 12/26/10 0456 12/25/10 0810 12/24/10 1459  NA 139 140 142  K 3.8 3.7 2.6*  CL 103 106 103  CO2 30 28 28   GLUCOSE 86 88 91  BUN 3* 8 10  CREATININE 0.91 0.87 0.70  CALCIUM 9.6 9.0 9.8   LFT  Basename 12/24/10 1459  PROT 6.8  ALBUMIN 3.9  AST 30  ALT 16  ALKPHOS 54  BILITOT 0.2*  BILIDIR --  IBILI --   Studies/Results: Nm Gastric Emptying  12/26/2010  *RADIOLOGY REPORT*  Clinical Data:  Vomiting.  Weight loss.  NUCLEAR MEDICINE GASTRIC EMPTYING SCAN  Technique:  After oral ingestion of radiolabeled meal, sequential abdominal images were obtained for 120 minutes.  Residual percentage of activity remaining within the stomach was calculated at 60 and 120 minutes.  Radiopharmaceutical: 2.0 mCi Tc-67m sulfur colloid.  Comparison:  None.  Findings: 2 hours after beginning of the examination, 54% of initially ingested radiotracer remains within the stomach.  This represents delayed gastric emptying (normal value less than 30%  remaining in the stomach at 2 hours).  IMPRESSION: Delayed gastric emptying as noted above.  Original Report Authenticated By: Fuller Canada, M.D.   Dg Abd Acute W/chest  12/24/2010  *RADIOLOGY REPORT*  Clinical Data: Epigastric pain, vomiting  ACUTE ABDOMEN SERIES (ABDOMEN 2 VIEW & CHEST 1 VIEW)  Comparison: CT abdomen pelvis of 12/18/2010 and chest x-ray of 02/08/2009  Findings: The lungs are clear.  Mediastinal contours appear stable. The heart is within normal limits in size.  There is a thoracolumbar scoliosis present.  Supine and erect views of the abdomen show no bowel obstruction. There are a few scattered air-fluid levels in the right lower quadrant of questionable significance.  A local inflammatory process cannot be excluded.  No free air is noted on the erect view.  Surgical clips are present in the right upper quadrant from prior cholecystectomy.  IMPRESSION:  1.  No active lung disease. 2.  No bowel obstruction or free air.  Scattered air-fluid levels in the right lower quadrant.  Cannot exclude a local inflammatory process. 3.  Thoracolumbar scoliosis.  Original Report Authenticated By: Juline Patch, M.D.    Medications: I have reviewed the patient's current medications.  Assessment/Plan: NAUSEA AND VOMITING 2o TO GASTRITIS & GASTROPARESIS-?GOO DUE TO PYLOROSPASM. I personally reviewed CT w/ dr. Toniann Fail abnomrality surrounding the pylorus.  PLAN: REGLAN QAC AND HS. PREFER LOWER DOSE DUE TO POSSIBILITY OF PYLOROSPASM.  ADVANCE DIET AWAIT BIOPSIES REFER TO [facility] FOR EGD/BOTOX INJECTION TO PYLORUS  IF PT UNABLE TO TOLERATE SOFT MECHANICAL DIET     LOS: 2 days   Kort Stettler 12/26/2010, 1:06 PM

## 2010-12-26 NOTE — Progress Notes (Signed)
REVIEWED. AGREE. 

## 2010-12-26 NOTE — Progress Notes (Signed)
Subjective: Patient just came back up from nuclear medicine, she underwent gastric emptying study. He denies any vomiting at this time. Still has epigastric pain. Still unable to tolerate any by mouth. She is on clear liquids.  Objective:  Vital signs in last 24 hours:  Filed Vitals:   12/25/10 2029 12/25/10 2310 12/26/10 0500 12/26/10 1105  BP:  130/85 115/72   Pulse:  62 63   Temp:  97.9 F (36.6 C) 98.2 F (36.8 C)   TempSrc:  Oral Oral   Resp:  18 16   Height:      Weight:      SpO2: 99% 100% 100% 100%    Intake/Output from previous day:   Intake/Output Summary (Last 24 hours) at 12/26/10 1137 Last data filed at 12/26/10 0800  Gross per 24 hour  Intake   2109 ml  Output    850 ml  Net   1259 ml    Physical Exam: General: Alert, awake, oriented x3, in no acute distress. HEENT: No bruits, no goiter. Moist mucous membranes, no scleral icterus, no conjunctival pallor. Heart: Regular rate and rhythm, without murmurs, rubs, gallops. Lungs: Clear to auscultation bilaterally. No wheezing, no rhonchi, no rales.  Abdomen: Soft, tender in epigastrium, nondistended, positive bowel sounds. Extremities: No clubbing cyanosis or edema,  positive pedal pulses. Neuro: Grossly intact, nonfocal.    Lab Results:  Basic Metabolic Panel:    Component Value Date/Time   NA 139 12/26/2010 0456   K 3.8 12/26/2010 0456   CL 103 12/26/2010 0456   CO2 30 12/26/2010 0456   BUN 3* 12/26/2010 0456   CREATININE 0.91 12/26/2010 0456   GLUCOSE 86 12/26/2010 0456   CALCIUM 9.6 12/26/2010 0456   CBC:    Component Value Date/Time   WBC 4.6 12/26/2010 0456   HGB 10.4* 12/26/2010 0456   HCT 31.3* 12/26/2010 0456   PLT 141* 12/26/2010 0456   MCV 90.2 12/26/2010 0456   NEUTROABS 4.2 12/24/2010 1459   LYMPHSABS 2.1 12/24/2010 1459   MONOABS 1.3* 12/24/2010 1459   EOSABS 0.0 12/24/2010 1459   BASOSABS 0.0 12/24/2010 1459      Lab 12/26/10 0456 12/25/10 0810 12/24/10 1459 12/23/10 1652 12/23/10 1628    WBC 4.6 5.7 7.6 -- 10.6*  HGB 10.4* 9.8* 10.6* 13.9 12.8  HCT 31.3* 29.1* 30.9* 41.0 38.3  PLT 141* 116* 135* -- 152  MCV 90.2 89.8 87.3 -- 88.0  MCH 30.0 30.2 29.9 -- 29.4  MCHC 33.2 33.7 34.3 -- 33.4  RDW 13.5 13.4 13.0 -- 12.9  LYMPHSABS -- -- 2.1 -- 1.3  MONOABS -- -- 1.3* -- 0.9  EOSABS -- -- 0.0 -- 0.0  BASOSABS -- -- 0.0 -- 0.0  BANDABS -- -- -- -- --    Lab 12/26/10 0456 12/25/10 0810 12/24/10 1506 12/24/10 1459 12/23/10 1652  NA 139 140 -- 142 142  K 3.8 3.7 -- 2.6* 3.0*  CL 103 106 -- 103 105  CO2 30 28 -- 28 --  GLUCOSE 86 88 -- 91 127*  BUN 3* 8 -- 10 14  CREATININE 0.91 0.87 -- 0.70 1.00  CALCIUM 9.6 9.0 -- 9.8 --  MG -- -- 1.8 -- --   No results found for this basename: INR:5,PROTIME:5 in the last 168 hours Cardiac markers: No results found for this basename: CK:3,CKMB:3,TROPONINI:3,MYOGLOBIN:3 in the last 168 hours No results found for this basename: POCBNP:3 in the last 168 hours Recent Results (from the past 240 hour(s))  URINE CULTURE  Status: Normal   Collection Time   12/23/10  4:28 PM      Component Value Range Status Comment   Specimen Description URINE, CLEAN CATCH   Final    Special Requests NONE   Final    Setup Time [id number]   Final    Colony Count 10,000 COLONIES/ML   Final    Culture     Final    Value: Multiple bacterial morphotypes present, none predominant. Suggest appropriate recollection if clinically indicated.   Report Status 12/24/2010 FINAL   Final     Studies/Results: Dg Abd Acute W/chest  12/24/2010  *RADIOLOGY REPORT*  Clinical Data: Epigastric pain, vomiting  ACUTE ABDOMEN SERIES (ABDOMEN 2 VIEW & CHEST 1 VIEW)  Comparison: CT abdomen pelvis of 12/18/2010 and chest x-ray of 02/08/2009  Findings: The lungs are clear.  Mediastinal contours appear stable. The heart is within normal limits in size.  There is a thoracolumbar scoliosis present.  Supine and erect views of the abdomen show no bowel obstruction. There are a few  scattered air-fluid levels in the right lower quadrant of questionable significance.  A local inflammatory process cannot be excluded.  No free air is noted on the erect view.  Surgical clips are present in the right upper quadrant from prior cholecystectomy.  IMPRESSION:  1.  No active lung disease. 2.  No bowel obstruction or free air.  Scattered air-fluid levels in the right lower quadrant.  Cannot exclude a local inflammatory process. 3.  Thoracolumbar scoliosis.  Original Report Authenticated By: Juline Patch, M.D.    Medications: Scheduled Meds:   . budesonide-formoterol  2 puff Inhalation BID  . dicyclomine  20 mg Oral TID AC  . DULoxetine  30 mg Oral Daily  . meperidine      . midazolam      . ondansetron  4 mg Intravenous QID  . pantoprazole  40 mg Oral BID AC  . potassium chloride  10 mEq Intravenous Q1 Hr x 4  . potassium chloride  10 mEq Intravenous Q1 Hr x 6  . sodium chloride      . sodium chloride       Continuous Infusions:   . dextrose 5 % and 0.9 % NaCl with KCl 40 mEq/L 1,000 mL (12/26/10 1043)   PRN Meds:.acetaminophen, acetaminophen, albuterol, butamben-tetracaine-benzocaine, HYDROcodone-acetaminophen, meperidine, midazolam, morphine injection, ondansetron (ZOFRAN) IV, simethicone susp in sterile water 1000 mL irrigation, technetium sulfur colloid  Assessment/Plan:  Principal Problem:  *Intractable vomiting Active Problems:  GERD (gastroesophageal reflux disease)  Epigastric pain  Hematemesis  Hypokalemia  Plan:  Follow up results of gastric emptying study. GI following, await further recommendations.   LOS: 2 days   Krisa Blattner 12/26/2010, 11:37 AM

## 2010-12-27 NOTE — Progress Notes (Signed)
Subjective: Complaining of epigastric pain, tolerating liquids  Objective:  Vital signs in last 24 hours:  Filed Vitals:   12/26/10 2154 12/26/10 2157 12/27/10 0551 12/27/10 0811  BP:  122/78 130/78   Pulse:  57 58   Temp:  98.1 F (36.7 C) 98.4 F (36.9 C)   TempSrc:  Oral Oral   Resp:  16 16   Height:      Weight:      SpO2: 96% 100% 100% 100%    Intake/Output from previous day:   Intake/Output Summary (Last 24 hours) at 12/27/10 1604 Last data filed at 12/27/10 1000  Gross per 24 hour  Intake   3162 ml  Output   1300 ml  Net   1862 ml    Physical Exam: General: Alert, awake, oriented x3, in no acute distress. HEENT: No bruits, no goiter. Moist mucous membranes, no scleral icterus, no conjunctival pallor. Heart: Regular rate and rhythm, without murmurs, rubs, gallops. Lungs: Clear to auscultation bilaterally. No wheezing, no rhonchi, no rales.  Abdomen: Soft, tender in epigastrium, nondistended, positive bowel sounds. Extremities: No clubbing cyanosis or edema,  positive pedal pulses. Neuro: Grossly intact, nonfocal.    Lab Results:  Basic Metabolic Panel:    Component Value Date/Time   NA 139 12/26/2010 0456   K 3.8 12/26/2010 0456   CL 103 12/26/2010 0456   CO2 30 12/26/2010 0456   BUN 3* 12/26/2010 0456   CREATININE 0.91 12/26/2010 0456   GLUCOSE 86 12/26/2010 0456   CALCIUM 9.6 12/26/2010 0456   CBC:    Component Value Date/Time   WBC 4.6 12/26/2010 0456   HGB 10.4* 12/26/2010 0456   HCT 31.3* 12/26/2010 0456   PLT 141* 12/26/2010 0456   MCV 90.2 12/26/2010 0456   NEUTROABS 4.2 12/24/2010 1459   LYMPHSABS 2.1 12/24/2010 1459   MONOABS 1.3* 12/24/2010 1459   EOSABS 0.0 12/24/2010 1459   BASOSABS 0.0 12/24/2010 1459      Lab 12/26/10 0456 12/25/10 0810 12/24/10 1459 12/23/10 1652 12/23/10 1628  WBC 4.6 5.7 7.6 -- 10.6*  HGB 10.4* 9.8* 10.6* 13.9 12.8  HCT 31.3* 29.1* 30.9* 41.0 38.3  PLT 141* 116* 135* -- 152  MCV 90.2 89.8 87.3 -- 88.0  MCH 30.0 30.2  29.9 -- 29.4  MCHC 33.2 33.7 34.3 -- 33.4  RDW 13.5 13.4 13.0 -- 12.9  LYMPHSABS -- -- 2.1 -- 1.3  MONOABS -- -- 1.3* -- 0.9  EOSABS -- -- 0.0 -- 0.0  BASOSABS -- -- 0.0 -- 0.0  BANDABS -- -- -- -- --    Lab 12/26/10 0456 12/25/10 0810 12/24/10 1506 12/24/10 1459 12/23/10 1652  NA 139 140 -- 142 142  K 3.8 3.7 -- 2.6* 3.0*  CL 103 106 -- 103 105  CO2 30 28 -- 28 --  GLUCOSE 86 88 -- 91 127*  BUN 3* 8 -- 10 14  CREATININE 0.91 0.87 -- 0.70 1.00  CALCIUM 9.6 9.0 -- 9.8 --  MG -- -- 1.8 -- --   No results found for this basename: INR:5,PROTIME:5 in the last 168 hours Cardiac markers: No results found for this basename: CK:3,CKMB:3,TROPONINI:3,MYOGLOBIN:3 in the last 168 hours No results found for this basename: POCBNP:3 in the last 168 hours Recent Results (from the past 240 hour(s))  URINE CULTURE     Status: Normal   Collection Time   12/23/10  4:28 PM      Component Value Range Status Comment   Specimen Description URINE, CLEAN  CATCH   Final    Special Requests NONE   Final    Setup Time [id number]   Final    Colony Count 10,000 COLONIES/ML   Final    Culture     Final    Value: Multiple bacterial morphotypes present, none predominant. Suggest appropriate recollection if clinically indicated.   Report Status 12/24/2010 FINAL   Final     Studies/Results: Nm Gastric Emptying  12/26/2010  *RADIOLOGY REPORT*  Clinical Data:  Vomiting.  Weight loss.  NUCLEAR MEDICINE GASTRIC EMPTYING SCAN  Technique:  After oral ingestion of radiolabeled meal, sequential abdominal images were obtained for 120 minutes.  Residual percentage of activity remaining within the stomach was calculated at 60 and 120 minutes.  Radiopharmaceutical: 2.0 mCi Tc-74m sulfur colloid.  Comparison:  None.  Findings: 2 hours after beginning of the examination, 54% of initially ingested radiotracer remains within the stomach.  This represents delayed gastric emptying (normal value less than 30% remaining in the  stomach at 2 hours).  IMPRESSION: Delayed gastric emptying as noted above.  Original Report Authenticated By: Fuller Canada, M.D.   Dg Abd Acute W/chest  12/26/2010  *RADIOLOGY REPORT*  Clinical Data: Epigastric pain, evaluate for free air  ACUTE ABDOMEN SERIES (ABDOMEN 2 VIEW & CHEST 1 VIEW)  Comparison: 12/24/2010  Findings: Lungs are clear. No pleural effusion or pneumothorax.  Cardiomediastinal silhouette is within normal limits.  Nonobstructive bowel gas pattern. No evidence of free air under the diaphragm on the upright view.  Cholecystectomy clips.  Mild thoracolumbar scoliosis.  IMPRESSION: No evidence of acute cardiopulmonary disease.  No evidence of small bowel obstruction or free air.  Original Report Authenticated By: Charline Bills, M.D.    Medications: Scheduled Meds:   . budesonide-formoterol  2 puff Inhalation BID  . dicyclomine  20 mg Oral TID AC  . DULoxetine  30 mg Oral Daily  . metoCLOPramide (REGLAN) injection  5 mg Intravenous TID AC & HS  . pantoprazole  40 mg Oral BID AC  . DISCONTD: ondansetron  4 mg Intravenous QID  . DISCONTD: ondansetron (ZOFRAN) IV  4 mg Intravenous Q6H  . DISCONTD: pantoprazole (PROTONIX) IV  40 mg Intravenous Q12H   Continuous Infusions:   . dextrose 5 % and 0.9 % NaCl with KCl 40 mEq/L 75 mL/hr at 12/27/10 1228   PRN Meds:.acetaminophen, acetaminophen, albuterol, HYDROcodone-acetaminophen, morphine injection, ondansetron (ZOFRAN) IV, DISCONTD: butamben-tetracaine-benzocaine, DISCONTD: meperidine, DISCONTD: midazolam, DISCONTD: simethicone susp in sterile water 1000 mL irrigation  Assessment/Plan:  Principal Problem:  *Intractable vomiting Active Problems:  GERD (gastroesophageal reflux disease)  Epigastric pain  Hematemesis  Hypokalemia  Plan:  Advancing diet today to see if she tolerates soft foods, if she doesn't tolerate it, she may need transfer to [facility] Appreciate GI assistance.   LOS: 3 days    Jadian Karman 12/27/2010, 4:04 PM

## 2010-12-27 NOTE — Progress Notes (Signed)
Subjective: Since I last evaluated the patient HAD EPIGASTRIC PAIN. AAS NAIAP. TOLERATING FULL LIQUIDS.   Objective: Vital signs in last 24 hours: Temp:  [98.1 F (36.7 C)-98.7 F (37.1 C)] 98.4 F (36.9 C) (12/08 0551) Pulse Rate:  [57-72] 58  (12/08 0551) Resp:  [16-18] 16  (12/08 0551) BP: (122-137)/(78-86) 130/78 mmHg (12/08 0551) SpO2:  [96 %-100 %] 100 % (12/08 0811) Last BM Date: 12/23/10  Intake/Output from previous day: 12/07 0701 - 12/08 0700 In: 3402 [P.O.:720; I.V.:2682] Out: 2500 [Urine:2500] Intake/Output this shift: Total I/O In: -  Out: 800 [Urine:800]  General appearance: alert, cooperative, appears stated age and no distress Resp: clear to auscultation bilaterally Cardio: regular rate and rhythm GI: soft, MILD TTP IN THE EPIGASTRIUM; NO REBOUND OR GUARDING-bowel sounds normal  Lab Results:  Basename 12/26/10 0456 12/25/10 0810 12/24/10 1459  WBC 4.6 5.7 7.6  HGB 10.4* 9.8* 10.6*  HCT 31.3* 29.1* 30.9*  PLT 141* 116* 135*   BMET  Basename 12/26/10 0456 12/25/10 0810 12/24/10 1459  NA 139 140 142  K 3.8 3.7 2.6*  CL 103 106 103  CO2 30 28 28   GLUCOSE 86 88 91  BUN 3* 8 10  CREATININE 0.91 0.87 0.70  CALCIUM 9.6 9.0 9.8   LFT  Basename 12/24/10 1459  PROT 6.8  ALBUMIN 3.9  AST 30  ALT 16  ALKPHOS 54  BILITOT 0.2*  BILIDIR --  IBILI --   Studies/Results: Nm Gastric Emptying  12/26/2010  *RADIOLOGY REPORT*  Clinical Data:  Vomiting.  Weight loss.  NUCLEAR MEDICINE GASTRIC EMPTYING SCAN  Technique:  After oral ingestion of radiolabeled meal, sequential abdominal images were obtained for 120 minutes.  Residual percentage of activity remaining within the stomach was calculated at 60 and 120 minutes.  Radiopharmaceutical: 2.0 mCi Tc-24m sulfur colloid.  Comparison:  None.  Findings: 2 hours after beginning of the examination, 54% of initially ingested radiotracer remains within the stomach.  This represents delayed gastric emptying (normal value  less than 30% remaining in the stomach at 2 hours).  IMPRESSION: Delayed gastric emptying as noted above.  Original Report Authenticated By: Fuller Canada, M.D.   Dg Abd Acute W/chest  12/26/2010  *RADIOLOGY REPORT*  Clinical Data: Epigastric pain, evaluate for free air  ACUTE ABDOMEN SERIES (ABDOMEN 2 VIEW & CHEST 1 VIEW)  Comparison: 12/24/2010  Findings: Lungs are clear. No pleural effusion or pneumothorax.  Cardiomediastinal silhouette is within normal limits.  Nonobstructive bowel gas pattern. No evidence of free air under the diaphragm on the upright view.  Cholecystectomy clips.  Mild thoracolumbar scoliosis.  IMPRESSION: No evidence of acute cardiopulmonary disease.  No evidence of small bowel obstruction or free air.  Original Report Authenticated By: Charline Bills, M.D.   Path: mild gastritis  Medications: I have reviewed the patient's current medications.  Assessment/Plan: PT HAS GASTROPARESIS, ?GOO FROM PYLOROSPASM. TOLERATING FULL LIQUID DIET.  PLAN: 1. ADVANCE DIET 2. CONTINUE REGLAN 3. EXPLAINED TO PT SHE SHOULD TRY BOTOX INJECTION IF SHE CAN'T TOLERATE A SOFT DIET. Recommend leaving surgery as a last option. 4. Change Zofran to prn. BID PPI.   LOS: 3 days   [facility] 12/27/2010, 12:41 PM

## 2010-12-28 MED ORDER — ONDANSETRON HCL 4 MG/2ML IJ SOLN
4.0000 mg | Freq: Three times a day (TID) | INTRAMUSCULAR | Status: DC
  Administered 2010-12-28 – 2011-01-01 (×15): 4 mg via INTRAVENOUS
  Filled 2010-12-28 (×12): qty 2

## 2010-12-28 NOTE — Progress Notes (Signed)
Subjective: Since I last evaluated the patient C/O CRAMPING SQUEEZING PAIN AFTER EATING. NO VOMITING. TOLERATING SOFT MECHANICAL DIET.  Objective: Vital signs in last 24 hours: Temp:  [97.9 F (36.6 C)-98 F (36.7 C)] 98 F (36.7 C) (12/09 1353) Pulse Rate:  [54-65] 57  (12/09 1353) Resp:  [18-20] 20  (12/09 1353) BP: (114-137)/(73-80) 114/73 mmHg (12/09 1353) SpO2:  [96 %-100 %] 100 % (12/09 1353) Last BM Date: 12/27/10  Intake/Output from previous day: 12/08 0701 - 12/09 0700 In: 3062.5 [P.O.:1200; I.V.:1862.5] Out: 1250 [Urine:1250] Intake/Output this shift: Total I/O In: -  Out: 1400 [Urine:1400]  General appearance: alert, cooperative and no distress GI: soft, non-tender; bowel sounds normal; no masses,  no organomegaly  Lab Results:  [facility] 12/26/10 0456  WBC 4.6  HGB 10.4*  HCT 31.3*  PLT 141*   BMET  Basename 12/26/10 0456  NA 139  K 3.8  CL 103  CO2 30  GLUCOSE 86  BUN 3*  CREATININE 0.91  CALCIUM 9.6   Studies/Results: Dg Abd Acute W/chest  12/26/2010  *RADIOLOGY REPORT*  Clinical Data: Epigastric pain, evaluate for free air  ACUTE ABDOMEN SERIES (ABDOMEN 2 VIEW & CHEST 1 VIEW)  Comparison: 12/24/2010  Findings: Lungs are clear. No pleural effusion or pneumothorax.  Cardiomediastinal silhouette is within normal limits.  Nonobstructive bowel gas pattern. No evidence of free air under the diaphragm on the upright view.  Cholecystectomy clips.  Mild thoracolumbar scoliosis.  IMPRESSION: No evidence of acute cardiopulmonary disease.  No evidence of small bowel obstruction or free air.  Original Report Authenticated By: Charline Bills, M.D.    Medications: I have reviewed the patient's current medications.  Assessment/Plan: gastroparesis: idiopathic v. 2o to ?GOO due to pylorospasm  PLAN: 1. D/C REGLAN & REASSESS ON 12/10. 2. CONTINUE SOFT DIET 3. ZOFRAN ATC 4. CHECK HGA1C, CORTISOL, & TSH ON 12/10.   LOS: 4 days   [facility] 12/28/2010,  3:46 PM

## 2010-12-28 NOTE — Progress Notes (Signed)
Subjective: Tolerating small amounts of soft foods.  Still having epigastric pain requiring IV pain meds.  Objective:  Vital signs in last 24 hours:  Filed Vitals:   12/27/10 2321 12/28/10 0530 12/28/10 0849 12/28/10 1353  BP:  130/80  114/73  Pulse:  59  57  Temp:  98 F (36.7 C)  98 F (36.7 C)  TempSrc:      Resp:  20  20  Height:      Weight:      SpO2: 97% 99% 96% 100%    Intake/Output from previous day:   Intake/Output Summary (Last 24 hours) at 12/28/10 1739 Last data filed at 12/28/10 1137  Gross per 24 hour  Intake  962.5 ml  Output   1400 ml  Net -437.5 ml    Physical Exam: General: Alert, awake, oriented x3, in no acute distress. HEENT: No bruits, no goiter. Moist mucous membranes, no scleral icterus, no conjunctival pallor. Heart: Regular rate and rhythm, without murmurs, rubs, gallops. Lungs: Clear to auscultation bilaterally. No wheezing, no rhonchi, no rales.  Abdomen: Soft, epigastric tenderness, nondistended, positive bowel sounds. Extremities: No clubbing cyanosis or edema,  positive pedal pulses. Neuro: Grossly intact, nonfocal.    Lab Results:  Basic Metabolic Panel:    Component Value Date/Time   NA 139 12/26/2010 0456   K 3.8 12/26/2010 0456   CL 103 12/26/2010 0456   CO2 30 12/26/2010 0456   BUN 3* 12/26/2010 0456   CREATININE 0.91 12/26/2010 0456   GLUCOSE 86 12/26/2010 0456   CALCIUM 9.6 12/26/2010 0456   CBC:    Component Value Date/Time   WBC 4.6 12/26/2010 0456   HGB 10.4* 12/26/2010 0456   HCT 31.3* 12/26/2010 0456   PLT 141* 12/26/2010 0456   MCV 90.2 12/26/2010 0456   NEUTROABS 4.2 12/24/2010 1459   LYMPHSABS 2.1 12/24/2010 1459   MONOABS 1.3* 12/24/2010 1459   EOSABS 0.0 12/24/2010 1459   BASOSABS 0.0 12/24/2010 1459      Lab 12/26/10 0456 12/25/10 0810 12/24/10 1459 12/23/10 1652 12/23/10 1628  WBC 4.6 5.7 7.6 -- 10.6*  HGB 10.4* 9.8* 10.6* 13.9 12.8  HCT 31.3* 29.1* 30.9* 41.0 38.3  PLT 141* 116* 135* -- 152  MCV 90.2 89.8  87.3 -- 88.0  MCH 30.0 30.2 29.9 -- 29.4  MCHC 33.2 33.7 34.3 -- 33.4  RDW 13.5 13.4 13.0 -- 12.9  LYMPHSABS -- -- 2.1 -- 1.3  MONOABS -- -- 1.3* -- 0.9  EOSABS -- -- 0.0 -- 0.0  BASOSABS -- -- 0.0 -- 0.0  BANDABS -- -- -- -- --    Lab 12/26/10 0456 12/25/10 0810 12/24/10 1506 12/24/10 1459 12/23/10 1652  NA 139 140 -- 142 142  K 3.8 3.7 -- 2.6* 3.0*  CL 103 106 -- 103 105  CO2 30 28 -- 28 --  GLUCOSE 86 88 -- 91 127*  BUN 3* 8 -- 10 14  CREATININE 0.91 0.87 -- 0.70 1.00  CALCIUM 9.6 9.0 -- 9.8 --  MG -- -- 1.8 -- --   No results found for this basename: INR:5,PROTIME:5 in the last 168 hours Cardiac markers: No results found for this basename: CK:3,CKMB:3,TROPONINI:3,MYOGLOBIN:3 in the last 168 hours No results found for this basename: POCBNP:3 in the last 168 hours Recent Results (from the past 240 hour(s))  URINE CULTURE     Status: Normal   Collection Time   12/23/10  4:28 PM      Component Value Range Status Comment  Specimen Description URINE, CLEAN CATCH   Final    Special Requests NONE   Final    Setup Time [id number]   Final    Colony Count 10,000 COLONIES/ML   Final    Culture     Final    Value: Multiple bacterial morphotypes present, none predominant. Suggest appropriate recollection if clinically indicated.   Report Status 12/24/2010 FINAL   Final     Studies/Results: Dg Abd Acute W/chest  12/26/2010  *RADIOLOGY REPORT*  Clinical Data: Epigastric pain, evaluate for free air  ACUTE ABDOMEN SERIES (ABDOMEN 2 VIEW & CHEST 1 VIEW)  Comparison: 12/24/2010  Findings: Lungs are clear. No pleural effusion or pneumothorax.  Cardiomediastinal silhouette is within normal limits.  Nonobstructive bowel gas pattern. No evidence of free air under the diaphragm on the upright view.  Cholecystectomy clips.  Mild thoracolumbar scoliosis.  IMPRESSION: No evidence of acute cardiopulmonary disease.  No evidence of small bowel obstruction or free air.  Original Report Authenticated  By: Charline Bills, M.D.    Medications: Scheduled Meds:   . budesonide-formoterol  2 puff Inhalation BID  . dicyclomine  20 mg Oral TID AC  . DULoxetine  30 mg Oral Daily  . ondansetron  4 mg Intravenous TID AC & HS  . pantoprazole  40 mg Oral BID AC  . DISCONTD: metoCLOPramide (REGLAN) injection  5 mg Intravenous TID AC & HS   Continuous Infusions:   . dextrose 5 % and 0.9 % NaCl with KCl 40 mEq/L 1,000 mL (12/28/10 1401)   PRN Meds:.acetaminophen, acetaminophen, albuterol, HYDROcodone-acetaminophen, morphine injection, ondansetron (ZOFRAN) IV  Assessment/Plan:  Principal Problem:  *Intractable vomiting Active Problems:  GERD (gastroesophageal reflux disease)  Epigastric pain  Hematemesis  Hypokalemia  Plan:   It appears that she is tolerating small amounts of solid food Will transition her pain control over to oral pain meds If she can manage on oral pain meds, then she can likely d/c home in the morning.   LOS: 4 days   MEMON,JEHANZEB 12/28/2010, 5:39 PM

## 2010-12-29 DIAGNOSIS — K3184 Gastroparesis: Secondary | ICD-10-CM

## 2010-12-29 DIAGNOSIS — K311 Adult hypertrophic pyloric stenosis: Secondary | ICD-10-CM

## 2010-12-29 LAB — HEMOGLOBIN A1C
Hgb A1c MFr Bld: 5.4 % (ref <5.7)
Mean Plasma Glucose: 108 mg/dL (ref <117)

## 2010-12-29 LAB — TISSUE TRANSGLUTAMINASE, IGA: Tissue Transglutaminase Ab, IgA: 4 U/mL (ref <20)

## 2010-12-29 LAB — TSH: TSH: 2.847 u[IU]/mL (ref 0.350–4.500)

## 2010-12-29 LAB — CORTISOL-AM, BLOOD: Cortisol - AM: 4.9 ug/dL (ref 4.3–22.4)

## 2010-12-29 MED ORDER — SODIUM CHLORIDE 0.9 % IJ SOLN
INTRAMUSCULAR | Status: AC
  Administered 2010-12-29: 08:00:00
  Filled 2010-12-29: qty 3

## 2010-12-29 MED ORDER — HYDROMORPHONE HCL 1 MG/ML PO LIQD: 0.5000 mg | Freq: Once | ORAL | Status: DC

## 2010-12-29 MED ORDER — HYDROMORPHONE HCL PF 1 MG/ML IJ SOLN
0.5000 mg | Freq: Once | INTRAMUSCULAR | Status: AC
  Administered 2010-12-29: 0.5 mg via INTRAVENOUS
  Filled 2010-12-29: qty 1

## 2010-12-29 NOTE — Progress Notes (Signed)
Subjective: Did not tolerate diet today.  Developed severe pain and vomiting.  Objective:  Vital signs in last 24 hours:  Filed Vitals:   12/28/10 2206 12/29/10 0543 12/29/10 0800 12/29/10 1402  BP: 123/78 110/74  143/70  Pulse: 56 56  74  Temp: 97.9 F (36.6 C) 98.3 F (36.8 C)  97.5 F (36.4 C)  TempSrc: Oral Oral  Oral  Resp: 18 18  18   Height:      Weight:      SpO2: 98% 99% 98% 98%    Intake/Output from previous day:   Intake/Output Summary (Last 24 hours) at 12/29/10 1633 Last data filed at 12/29/10 0800  Gross per 24 hour  Intake   1817 ml  Output    800 ml  Net   1017 ml    Physical Exam: General: Alert, awake, oriented x3, in no acute distress. HEENT: No bruits, no goiter. Moist mucous membranes, no scleral icterus, no conjunctival pallor. Heart: Regular rate and rhythm, without murmurs, rubs, gallops. Lungs: Clear to auscultation bilaterally. No wheezing, no rhonchi, no rales.  Abdomen: Soft, nontender, nondistended, positive bowel sounds. Extremities: No clubbing cyanosis or edema,  positive pedal pulses. Neuro: Grossly intact, nonfocal.    Lab Results:  Basic Metabolic Panel:    Component Value Date/Time   NA 139 12/26/2010 0456   K 3.8 12/26/2010 0456   CL 103 12/26/2010 0456   CO2 30 12/26/2010 0456   BUN 3* 12/26/2010 0456   CREATININE 0.91 12/26/2010 0456   GLUCOSE 86 12/26/2010 0456   CALCIUM 9.6 12/26/2010 0456   CBC:    Component Value Date/Time   WBC 4.6 12/26/2010 0456   HGB 10.4* 12/26/2010 0456   HCT 31.3* 12/26/2010 0456   PLT 141* 12/26/2010 0456   MCV 90.2 12/26/2010 0456   NEUTROABS 4.2 12/24/2010 1459   LYMPHSABS 2.1 12/24/2010 1459   MONOABS 1.3* 12/24/2010 1459   EOSABS 0.0 12/24/2010 1459   BASOSABS 0.0 12/24/2010 1459      Lab 12/26/10 0456 12/25/10 0810 12/24/10 1459 12/23/10 1652 12/23/10 1628  WBC 4.6 5.7 7.6 -- 10.6*  HGB 10.4* 9.8* 10.6* 13.9 12.8  HCT 31.3* 29.1* 30.9* 41.0 38.3  PLT 141* 116* 135* -- 152  MCV 90.2  89.8 87.3 -- 88.0  MCH 30.0 30.2 29.9 -- 29.4  MCHC 33.2 33.7 34.3 -- 33.4  RDW 13.5 13.4 13.0 -- 12.9  LYMPHSABS -- -- 2.1 -- 1.3  MONOABS -- -- 1.3* -- 0.9  EOSABS -- -- 0.0 -- 0.0  BASOSABS -- -- 0.0 -- 0.0  BANDABS -- -- -- -- --    Lab 12/26/10 0456 12/25/10 0810 12/24/10 1506 12/24/10 1459 12/23/10 1652  NA 139 140 -- 142 142  K 3.8 3.7 -- 2.6* 3.0*  CL 103 106 -- 103 105  CO2 30 28 -- 28 --  GLUCOSE 86 88 -- 91 127*  BUN 3* 8 -- 10 14  CREATININE 0.91 0.87 -- 0.70 1.00  CALCIUM 9.6 9.0 -- 9.8 --  MG -- -- 1.8 -- --   No results found for this basename: INR:5,PROTIME:5 in the last 168 hours Cardiac markers: No results found for this basename: CK:3,CKMB:3,TROPONINI:3,MYOGLOBIN:3 in the last 168 hours No components found with this basename: POCBNP:3 Recent Results (from the past 240 hour(s))  URINE CULTURE     Status: Normal   Collection Time   12/23/10  4:28 PM      Component Value Range Status Comment   Specimen Description  URINE, CLEAN CATCH   Final    Special Requests NONE   Final    Setup Time [id number]   Final    Colony Count 10,000 COLONIES/ML   Final    Culture     Final    Value: Multiple bacterial morphotypes present, none predominant. Suggest appropriate recollection if clinically indicated.   Report Status 12/24/2010 FINAL   Final     Studies/Results: No results found.  Medications: Scheduled Meds:   . budesonide-formoterol  2 puff Inhalation BID  . dicyclomine  20 mg Oral TID AC  . DULoxetine  30 mg Oral Daily  .  HYDROmorphone (DILAUDID) injection  0.5 mg Intravenous Once  . ondansetron  4 mg Intravenous TID AC & HS  . pantoprazole  40 mg Oral BID AC  . sodium chloride      . DISCONTD: HYDROmorphone HCl  0.5 mg Oral Once   Continuous Infusions:   . DISCONTD: dextrose 5 % and 0.9 % NaCl with KCl 40 mEq/L 75 mL/hr at 12/29/10 0220   PRN Meds:.acetaminophen, acetaminophen, albuterol, HYDROcodone-acetaminophen, ondansetron (ZOFRAN) IV,  DISCONTD:  morphine injection  Assessment/Plan:  Principal Problem:  *Intractable vomiting Active Problems:  GERD (gastroesophageal reflux disease)  Epigastric pain  Hematemesis  Hypokalemia  Plan:  Plans for UGI tomorrow to evaluate for GOO, possible transfer to [facility] Cont PPI, pain management   LOS: 5 days   MEMON,JEHANZEB 12/29/2010, 4:33 PM

## 2010-12-29 NOTE — Progress Notes (Signed)
Pt unable to tolerate solid food due to pain. No vomiting. Mild nausea. Pt needs UGI to evaluate for GOO. Discussed with radiology. Will obtain tomorrow since pt has eaten. Discused EGD/BOTOX injection at [facility], dr. Steele Sizer. Happy to accept pt for DR. KOCH, but no beds available. Will need to daily for bed update.

## 2010-12-29 NOTE — Progress Notes (Signed)
Subjective: Nausea and vomiting has resolved. She's having cramp-like pain in her epigastric area that is mild. She has not had a BM in 2 days. This is normal for her. Objective: Vital signs in last 24 hours: Temp:  [97.9 F (36.6 C)-98.3 F (36.8 C)] 98.3 F (36.8 C) (12/10 0543) Pulse Rate:  [56-57] 56  (12/10 0543) Resp:  [18-20] 18  (12/10 0543) BP: (110-123)/(73-78) 110/74 mmHg (12/10 0543) SpO2:  [98 %-100 %] 98 % (12/10 0800) Last BM Date: 12/27/10 General:   Alert,  Well-developed, well-nourished, pleasant and cooperative in NAD Eyes:  Sclera clear, no icterus.   Conjunctiva pink. Mouth:  No deformity or lesions, oropharynx pink & moist. Heart:  Regular rate and rhythm; no murmurs, clicks, rubs,  or gallops. Abdomen:  Soft, nontender and nondistended. No masses, hepatosplenomegaly or hernias noted. Normal bowel sounds, without guarding, and without rebound.   Msk:  Symmetrical without gross deformities. Normal posture. Pulses:  Normal pulses noted. Extremities:  Without clubbing or edema. Neurologic:  Alert and  oriented x4;  grossly normal neurologically. Skin:  Intact without significant lesions or rashes. Psych:  Alert and cooperative. Normal mood and affect.  Intake/Output from previous day: 12/09 0701 - 12/10 0700 In: 1815 [P.O.:240; I.V.:1575] Out: 2200 [Urine:2200]  Assessment: 1. Gastroparesis 2. GOO secondary to possible pyloric channel spasm  Plan: 1. 6 small meals per day 2. MiraLax when necessary constipation 3. Persistent nausea vomiting abdominal pain, would consider referral to [facility] for further evaluation & consideration of Botox injections. 4. Office visit in one month with Dr. Jena Gauss 5. Follow up on biopsies  LOS: 5 days   Tracey Daniel  12/29/2010, 9:27 AM

## 2010-12-30 ENCOUNTER — Inpatient Hospital Stay (HOSPITAL_COMMUNITY): Payer: Medicare Other

## 2010-12-30 MED ORDER — SODIUM CHLORIDE 0.9 % IJ SOLN
INTRAMUSCULAR | Status: AC
  Filled 2010-12-30: qty 3

## 2010-12-30 MED ORDER — FLUCONAZOLE 100 MG PO TABS
150.0000 mg | ORAL_TABLET | Freq: Once | ORAL | Status: AC
  Administered 2010-12-30: 150 mg via ORAL
  Filled 2010-12-30: qty 2

## 2010-12-30 NOTE — Progress Notes (Addendum)
Subjective: Patient still has severe pain worsened by eating. 7/10, but worse sometimes after eating. Denies any vomiting. Did not have breakfast today, due to upper GI scheduled. Tolerated dinner, but induced pain. She has noted yellowish vaginal discharge with blood tinged and feels she may may have a yeast infection.  Objective: Vital signs in last 24 hours: Temp:  [97.3 F (36.3 C)-98.3 F (36.8 C)] 98.3 F (36.8 C) (12/11 0611) Pulse Rate:  [57-74] 64  (12/11 0611) Resp:  [16-18] 16  (12/11 0611) BP: (104-143)/(65-72) 104/72 mmHg (12/11 0611) SpO2:  [95 %-98 %] 98 % (12/11 0713) Last BM Date: 12/27/10 General:   Alert,  Well-developed, well-nourished, pleasant and cooperative in NAD Eyes:  Sclera clear, no icterus.   Conjunctiva pink. Mouth:  No deformity or lesions, oropharynx pink & moist. Heart:  Regular rate and rhythm; no murmurs, clicks, rubs,  or gallops. Abdomen:  Soft, nondistended. Mild epigastric tenderness to palpation. No masses, hepatosplenomegaly or hernias noted. Normal bowel sounds, without guarding, and without rebound.   Msk:  Symmetrical without gross deformities. Normal posture. Pulses:  Normal pulses noted. Extremities:  Without clubbing or edema. Neurologic:  Alert and  oriented x4;  grossly normal neurologically. Skin:  Intact without significant lesions or rashes. Psych:  Alert and cooperative. Normal mood and affect.  Intake/Output from previous day: 12/10 0701 - 12/11 0700 In: 842 [P.O.:840; IV Piggyback:2] Out: 500 [Urine:500]  Assessment: 1. Gastroparesis: Idiopathic vs. secondary to GOO 2. GOO secondary to possible pyloric channel spasm: Will have upper GI series to rule out structural component. 3. Vaginal discharge possible candida  Plan: 1. Followup upper GI series today 2. Pending transfer to [facility] for further evaluation & consideration of Botox injections. 3. Follow up on biopsies 4. Hospitalist to address vaginal discharge  w/possible candida  LOS: 6 days   Lorenza Burton  12/30/2010, 9:59 AM   Impressively abnormal upper GI series. I reviewed the images with Dr. Tyron Russell. She appears to have a hypertrophied pylorus with very little opening throughout the entire course of the protracted study. A very small aperture seen on multiple images with secondary poor gastric emptying.  The patient tells me she has not vomited so far today with the help of anti-medic agents.  She does have a succussion splash this afternoon.  I called [facility] once again today.  Spoke to [place].  They are at capacity and have no beds available. They suggested we call back tomorrow.  For now, the patient needs to limit her intake to a thin consistency full liquid diet. She will not likely do well with any attempt at consuming more solid food. I suppose if her nausea settles down with a limited diet, we could transition her to the outpatient setting and get  her over to [facility] as an outpatient over the next several days for an EGD with Botox injection. We'll reassess tomorrow.

## 2010-12-30 NOTE — Progress Notes (Signed)
Subjective: Still has epigastric pain and nausea, worse after eating  Objective:  Vital signs in last 24 hours:  Filed Vitals:   12/29/10 1934 12/29/10 2200 12/30/10 0611 12/30/10 0713  BP:  104/65 104/72   Pulse:  57 64   Temp:  97.3 F (36.3 C) 98.3 F (36.8 C)   TempSrc:  Oral Oral   Resp:  18 16   Height:      Weight:      SpO2: 97% 95% 98% 98%    Intake/Output from previous day:   Intake/Output Summary (Last 24 hours) at 12/30/10 1432 Last data filed at 12/30/10 0500  Gross per 24 hour  Intake    840 ml  Output    500 ml  Net    340 ml    Physical Exam: General: Alert, awake, oriented x3, in no acute distress. HEENT: No bruits, no goiter. Moist mucous membranes, no scleral icterus, no conjunctival pallor. Heart: Regular rate and rhythm, without murmurs, rubs, gallops. Lungs: Clear to auscultation bilaterally. No wheezing, no rhonchi, no rales.  Abdomen: Soft, epigastric tenderness, nondistended, positive bowel sounds. Extremities: No clubbing cyanosis or edema,  positive pedal pulses. Neuro: Grossly intact, nonfocal.    Lab Results:  Basic Metabolic Panel:    Component Value Date/Time   NA 139 12/26/2010 0456   K 3.8 12/26/2010 0456   CL 103 12/26/2010 0456   CO2 30 12/26/2010 0456   BUN 3* 12/26/2010 0456   CREATININE 0.91 12/26/2010 0456   GLUCOSE 86 12/26/2010 0456   CALCIUM 9.6 12/26/2010 0456   CBC:    Component Value Date/Time   WBC 4.6 12/26/2010 0456   HGB 10.4* 12/26/2010 0456   HCT 31.3* 12/26/2010 0456   PLT 141* 12/26/2010 0456   MCV 90.2 12/26/2010 0456   NEUTROABS 4.2 12/24/2010 1459   LYMPHSABS 2.1 12/24/2010 1459   MONOABS 1.3* 12/24/2010 1459   EOSABS 0.0 12/24/2010 1459   BASOSABS 0.0 12/24/2010 1459      Lab 12/26/10 0456 12/25/10 0810 12/24/10 1459 12/23/10 1652 12/23/10 1628  WBC 4.6 5.7 7.6 -- 10.6*  HGB 10.4* 9.8* 10.6* 13.9 12.8  HCT 31.3* 29.1* 30.9* 41.0 38.3  PLT 141* 116* 135* -- 152  MCV 90.2 89.8 87.3 -- 88.0  MCH 30.0  30.2 29.9 -- 29.4  MCHC 33.2 33.7 34.3 -- 33.4  RDW 13.5 13.4 13.0 -- 12.9  LYMPHSABS -- -- 2.1 -- 1.3  MONOABS -- -- 1.3* -- 0.9  EOSABS -- -- 0.0 -- 0.0  BASOSABS -- -- 0.0 -- 0.0  BANDABS -- -- -- -- --    Lab 12/26/10 0456 12/25/10 0810 12/24/10 1506 12/24/10 1459 12/23/10 1652  NA 139 140 -- 142 142  K 3.8 3.7 -- 2.6* 3.0*  CL 103 106 -- 103 105  CO2 30 28 -- 28 --  GLUCOSE 86 88 -- 91 127*  BUN 3* 8 -- 10 14  CREATININE 0.91 0.87 -- 0.70 1.00  CALCIUM 9.6 9.0 -- 9.8 --  MG -- -- 1.8 -- --   No results found for this basename: INR:5,PROTIME:5 in the last 168 hours Cardiac markers: No results found for this basename: CK:3,CKMB:3,TROPONINI:3,MYOGLOBIN:3 in the last 168 hours No components found with this basename: POCBNP:3 Recent Results (from the past 240 hour(s))  URINE CULTURE     Status: Normal   Collection Time   12/23/10  4:28 PM      Component Value Range Status Comment   Specimen Description URINE, CLEAN  CATCH   Final    Special Requests NONE   Final    Setup Time [id number]   Final    Colony Count 10,000 COLONIES/ML   Final    Culture     Final    Value: Multiple bacterial morphotypes present, none predominant. Suggest appropriate recollection if clinically indicated.   Report Status 12/24/2010 FINAL   Final     Studies/Results: No results found.  Medications: Scheduled Meds:   . budesonide-formoterol  2 puff Inhalation BID  . dicyclomine  20 mg Oral TID AC  . DULoxetine  30 mg Oral Daily  . fluconazole  150 mg Oral Once  .  HYDROmorphone (DILAUDID) injection  0.5 mg Intravenous Once  . ondansetron  4 mg Intravenous TID AC & HS  . pantoprazole  40 mg Oral BID AC  . sodium chloride      . sodium chloride      . sodium chloride      . DISCONTD: HYDROmorphone HCl  0.5 mg Oral Once   Continuous Infusions:  PRN Meds:.acetaminophen, acetaminophen, albuterol, HYDROcodone-acetaminophen, ondansetron (ZOFRAN) IV  Assessment/Plan:  This is a 30 year old  female who was admitted to the hospital with intractable nausea and vomiting, and dehydration, hypokalemia. Patient was seen in consultation by Dr. Darrick Penna from gastroenterology. She underwent upper endoscopy which showed moderate gastritis as well as possible gastric outlet obstruction due to  ?pylorospasm. She also had gastric emptying study done which showed delayed gastric emptying. Her diet was advanced from n.p.o. to liquids and subsequently to soft foods. She did not tolerate this and continued to have worsening epigastric pain, nausea, vomiting. Dr. Darrick Penna has discussed possible transfer to [facility] for EGD with Botox injections to relieve any pyloric spasm. The patient has been accepted over there, but currently we await a bed. Patient also had an upper GI series done today to rule out any mechanical obstruction with results currently pending. Since admission her dehydration has improved with IV fluids. Her hypokalemia has also resolved.   LOS: 6 days   MEMON,JEHANZEB 12/30/2010, 2:32 PM

## 2010-12-31 ENCOUNTER — Encounter: Payer: Self-pay | Admitting: Gastroenterology

## 2010-12-31 LAB — BASIC METABOLIC PANEL
Calcium: 10.5 mg/dL (ref 8.4–10.5)
Creatinine, Ser: 1.12 mg/dL — ABNORMAL HIGH (ref 0.50–1.10)
GFR calc Af Amer: 74 mL/min — ABNORMAL LOW (ref >90)
GFR calc non Af Amer: 64 mL/min — ABNORMAL LOW (ref >90)

## 2010-12-31 LAB — CBC
Platelets: 240 10*3/uL (ref 150–400)
RDW: 12.9 % (ref 11.5–15.5)
WBC: 6 10*3/uL (ref 4.0–10.5)

## 2010-12-31 NOTE — Progress Notes (Signed)
Subjective: Patient seen and examined this morning. Still has abdominal pain, but has been tolerating full liquids today. Denies nausea or vomiting.  Objective:  Vital signs in last 24 hours:  Filed Vitals:   12/30/10 1950 12/30/10 2156 12/31/10 0500 12/31/10 0827  BP:  119/75 129/84   Pulse:  67 62   Temp:  97.9 F (36.6 C) 98 F (36.7 C)   TempSrc:  Oral Oral   Resp:  18 16   Height:      Weight:      SpO2: 99% 99% 100% 97%    Intake/Output from previous day:   Intake/Output Summary (Last 24 hours) at 12/31/10 1108 Last data filed at 12/31/10 0900  Gross per 24 hour  Intake    244 ml  Output   1000 ml  Net   -756 ml    Physical Exam:  General:middle aged female  in no acute distress. HEENT: no pallor, no icterus, moist oral mucosa, no JVD, no lymphadenopathy Heart: Normal  s1 &s2  Regular rate and rhythm, without murmurs, rubs, gallops. Lungs: Clear to auscultation bilaterally. Abdomen: Soft, tender to palpation over epigastric area, nondistended, positive bowel sounds. Extremities: No clubbing cyanosis or edema with positive pedal pulses. Neuro: Alert, awake, oriented x3, nonfocal.   Lab Results:  Basic Metabolic Panel:    Component Value Date/Time   NA 137 12/31/2010 0506   K 4.4 12/31/2010 0506   CL 97 12/31/2010 0506   CO2 34* 12/31/2010 0506   BUN 12 12/31/2010 0506   CREATININE 1.12* 12/31/2010 0506   GLUCOSE 90 12/31/2010 0506   CALCIUM 10.5 12/31/2010 0506   CBC:    Component Value Date/Time   WBC 6.0 12/31/2010 0506   HGB 13.6 12/31/2010 0506   HCT 41.0 12/31/2010 0506   PLT 240 12/31/2010 0506   MCV 89.7 12/31/2010 0506   NEUTROABS 4.2 12/24/2010 1459   LYMPHSABS 2.1 12/24/2010 1459   MONOABS 1.3* 12/24/2010 1459   EOSABS 0.0 12/24/2010 1459   BASOSABS 0.0 12/24/2010 1459    Recent Results (from the past 240 hour(s))  URINE CULTURE     Status: Normal   Collection Time   12/23/10  4:28 PM      Component Value Range Status Comment   Specimen Description URINE, CLEAN CATCH   Final    Special Requests NONE   Final    Setup Time [id number]   Final    Colony Count 10,000 COLONIES/ML   Final    Culture     Final    Value: Multiple bacterial morphotypes present, none predominant. Suggest appropriate recollection if clinically indicated.   Report Status 12/24/2010 FINAL   Final     Studies/Results: Dg Ugi W/high Density W/kub  12/30/2010  *RADIOLOGY REPORT*  Clinical Data: Postprandial abdominal pain, suspected pylorospasm on EGD  UPPER GI SERIES WITH AIR/HIGH DENSITY BARIUM AND KUB:  Technique: Routine air contrast upper GI series performed following ingestion of effervescent granules and high density barium.  Scout image was performed.  Fluoroscopy time:  2.6 minutes  Comparison:  None  Findings: Normal esophageal distention and motility. No esophageal mass or stricture. 12.5 mm diameter barium tablet passes oral cavity stomach without delay. Smooth esophageal walls without mucosal irregularity or ulceration. No gastroesophageal reflux identified during exam. Stomach distends normally without mass or ulceration. Rugal folds grossly normal appearance. Pyloric channel appears significantly narrowed throughout the exam though contrast passes across pylorus into proximal small bowel. This could represent pyloric muscular hypertrophy  versus pylorospasm. Duodenal bulb and sweep otherwise normal appearance. Jejunal loops demonstrate minimal mucosal fold pattern.  IMPRESSION: Pyloric muscle appears thickened with marked narrowing of the pyloric channel. Liquid contrast however does pass beyond the pylorus into the duodenum and proximal jejunal loops, though the channel remains narrowed. This could represent muscular hypertrophy of the pylorus or pylorospasm. No evidence of mass or ulceration.  Original Report Authenticated By: Lollie Marrow, M.D.    Medications: Scheduled Meds:   . budesonide-formoterol  2 puff Inhalation BID  .  dicyclomine  20 mg Oral TID AC  . DULoxetine  30 mg Oral Daily  . fluconazole  150 mg Oral Once  . ondansetron  4 mg Intravenous TID AC & HS  . pantoprazole  40 mg Oral BID AC  . sodium chloride      . sodium chloride      . sodium chloride       Continuous Infusions:  PRN Meds:.acetaminophen, acetaminophen, albuterol, HYDROcodone-acetaminophen, ondansetron (ZOFRAN) IV  Assessment: 30 y/o AA female admitted with intractable nausea and vomiting, epigastric pain and dehydration. EGD done showed moderate gastritis with possible gastrici outlet obstruction sue to pylorospasm. Gastric emptying study showed delayed gastric emptying. Since her symptoms did not improve and was not tolerating diet GI ( Dr Darrick Penna) recommended transferring her to [facility] for EGD with botulinum injection. She is awaiting a bed there. She had upper GI series done on 12/11 showing  Muscular hypertrophy of pylorus vs pylorospasm. . In the mean time she is tolerating full liquids but continues to have epigastric pain.   PLAN:  Abdominal pain with pyloric spasms and gastroparesis Currently tolerating full liquids Gi recommends d/c with outpatient admit to [facility] for procedure if that would expediter the process. Will discuss with CM on bed situation. Nausea and vomiting now stable Cont pain control and antiemetics.  Cont PPI and dicyclomine   Asthma  stable cont home meds  Depression:  stable cont cymbalta  Hypokalemia : resolved Mild AKI noted, follow in am labs  Diet: full liquids  Full code  LOS: 7 days   Nadine Ryle 12/31/2010, 11:08 AM

## 2010-12-31 NOTE — Progress Notes (Signed)
Subjective: No vomiting. Tolerating fulls. Continued abdominal pain, stable.   Objective: Vital signs in last 24 hours: Temp:  [97.9 F (36.6 C)-98 F (36.7 C)] 98 F (36.7 C) (12/12 0500) Pulse Rate:  [62-67] 62  (12/12 0500) Resp:  [16-18] 16  (12/12 0500) BP: (119-129)/(75-84) 129/84 mmHg (12/12 0500) SpO2:  [99 %-100 %] 100 % (12/12 0500) Last BM Date: 12/31/10 General:   Alert and oriented, pleasant Head:  Normocephalic and atraumatic. Eyes:  No icterus, sclera clear. Conjuctiva pink.  Mouth:  Without lesions, mucosa pink and moist.  Heart:  S1, S2 present, no murmurs noted.  Lungs: Clear to auscultation bilaterally, without wheezing, rales, or rhonchi.  Abdomen:  Bowel sounds present, soft, TTP epigastric region, non-distended. No HSM or hernias noted. No rebound or guarding. No masses appreciated  Msk:  Symmetrical without gross deformities. Normal posture. Extremities:  Without clubbing or edema. Neurologic:  Alert and  oriented x4;  grossly normal neurologically. Skin:  Warm and dry, intact without significant lesions.  Psych:  Alert and cooperative. Normal mood and affect.  Intake/Output from previous day: 12/11 0701 - 12/12 0700 In: 240 [P.O.:240] Out: 1000 [Urine:1000] Intake/Output this shift:    Lab Results:  Basename 12/31/10 0506  WBC 6.0  HGB 13.6  HCT 41.0  PLT 240   BMET  Basename 12/31/10 0506  NA 137  K 4.4  CL 97  CO2 34*  GLUCOSE 90  BUN 12  CREATININE 1.12*  CALCIUM 10.5     Studies/Results: Dg Ugi W/high Density W/kub  12/30/2010  *RADIOLOGY REPORT*  Clinical Data: Postprandial abdominal pain, suspected pylorospasm on EGD  UPPER GI SERIES WITH AIR/HIGH DENSITY BARIUM AND KUB:  Technique: Routine air contrast upper GI series performed following ingestion of effervescent granules and high density barium.  Scout image was performed.  Fluoroscopy time:  2.6 minutes  Comparison:  None  Findings: Normal esophageal distention and motility.  No esophageal mass or stricture. 12.5 mm diameter barium tablet passes oral cavity stomach without delay. Smooth esophageal walls without mucosal irregularity or ulceration. No gastroesophageal reflux identified during exam. Stomach distends normally without mass or ulceration. Rugal folds grossly normal appearance. Pyloric channel appears significantly narrowed throughout the exam though contrast passes across pylorus into proximal small bowel. This could represent pyloric muscular hypertrophy versus pylorospasm. Duodenal bulb and sweep otherwise normal appearance. Jejunal loops demonstrate minimal mucosal fold pattern.  IMPRESSION: Pyloric muscle appears thickened with marked narrowing of the pyloric channel. Liquid contrast however does pass beyond the pylorus into the duodenum and proximal jejunal loops, though the channel remains narrowed. This could represent muscular hypertrophy of the pylorus or pylorospasm. No evidence of mass or ulceration.  Original Report Authenticated By: Lollie Marrow, M.D.    Assessment: 30 year old female with pyloric channel spasms, gastroparesis, awaiting transfer to [facility] for Botox injections either directly from [facility] or as outpatient. No beds available as of yesterday. Reactive gastropathy and partial villous blunting on path from EGD, TTG IgA negative.  Abdominal pain stable, no episodes of emesis, tolerating fulls.   Plan: Continue fulls, antiemetic therapy Consider d/c home for outpatient f/u at [facility] vs direct transfer    LOS: 7 days   Gerrit Halls  12/31/2010, 7:47 AM    This lady may be more expeditiously managed by transitioning her into the outpatient setting and getting her an outpatient appointment for EGD with Botox injection at [facility] between now and the first of next week

## 2010-12-31 NOTE — Progress Notes (Signed)
Spoke w/ Cameon ( scheduler for Dr Chesley Mires) she asked me to fax over notes and she would discuss w/ Dr Chesley Mires about which day to add her for EGD w/ Botox- she will call me back after doing this- notes faxed to [phone]

## 2010-12-31 NOTE — Progress Notes (Unsigned)
Pt stable currently without further vomiting. Needs EGD with Botox injections at [facility] secondary to pyloric channel spasms. Dr. Darrick Penna has already discussed her case with Dr. Chesley Mires at [facility]. If at all possible, can we facilitate an outpatient EGD with [facility] shooting for soon as possible, (Fri or Monday?). This wouldn't be a visit but an outpatient procedure. She is currently inpatient at [facility], and it would be of best interest to get her home and see them outpatient.

## 2011-01-01 ENCOUNTER — Telehealth: Payer: Self-pay | Admitting: Gastroenterology

## 2011-01-01 DIAGNOSIS — K313 Pylorospasm, not elsewhere classified: Secondary | ICD-10-CM | POA: Diagnosis present

## 2011-01-01 DIAGNOSIS — K311 Adult hypertrophic pyloric stenosis: Secondary | ICD-10-CM

## 2011-01-01 LAB — BASIC METABOLIC PANEL
Chloride: 97 mEq/L (ref 96–112)
GFR calc Af Amer: 83 mL/min — ABNORMAL LOW (ref >90)
GFR calc non Af Amer: 72 mL/min — ABNORMAL LOW (ref >90)
Potassium: 4.5 mEq/L (ref 3.5–5.1)

## 2011-01-01 MED ORDER — PANTOPRAZOLE SODIUM 40 MG PO TBEC: 40.0000 mg | DELAYED_RELEASE_TABLET | Freq: Two times a day (BID) | ORAL | Status: DC

## 2011-01-01 MED ORDER — HYDROCODONE-ACETAMINOPHEN 5-325 MG PO TABS: 2.0000 | ORAL_TABLET | Freq: Four times a day (QID) | ORAL | Status: AC | PRN

## 2011-01-01 MED ORDER — ONDANSETRON HCL 4 MG PO TABS: 4.0000 mg | ORAL_TABLET | Freq: Four times a day (QID) | ORAL | Status: AC | PRN

## 2011-01-01 MED ORDER — ENSURE CLINICAL ST REVIGOR PO LIQD
237.0000 mL | Freq: Three times a day (TID) | ORAL | Status: DC
  Administered 2011-01-01 (×3): 237 mL via ORAL

## 2011-01-01 MED ORDER — ENSURE CLINICAL ST REVIGOR PO LIQD: 237.0000 mL | Freq: Three times a day (TID) | ORAL | Status: AC

## 2011-01-01 MED ORDER — ONDANSETRON HCL 4 MG PO TABS
4.0000 mg | ORAL_TABLET | Freq: Four times a day (QID) | ORAL | Status: DC
  Administered 2011-01-01 – 2011-01-02 (×5): 4 mg via ORAL
  Filled 2011-01-01 (×5): qty 1

## 2011-01-01 NOTE — Discharge Summary (Signed)
Patient ID: Tracey Daniel MRN: [id number] DOB/AGE: 1980-02-20 30 y.o.  Admit date: 12/24/2010 Discharge date: 01/01/2011  Primary Care Physician:  Syliva Overman, MD, MD  Discharge Diagnoses:    Present on Admission:  .Pylorospasm .Gastric outlet obstruction .Epigastric pain .Intractable vomiting .Hematemesis .GERD (gastroesophageal reflux disease) .Hypokalemia     Current Discharge Medication List    START taking these medications   Details  feeding supplement (ENSURE CLINICAL STRENGTH) LIQD Take 237 mLs by mouth 3 (three) times daily with meals. Qty: 10 Bottle, Refills: 3    HYDROcodone-acetaminophen (NORCO) 5-325 MG per tablet Take 2 tablets by mouth every 6 (six) hours as needed for pain. Qty: 30 tablet, Refills: 0    ondansetron (ZOFRAN) 4 MG tablet Take 1 tablet (4 mg total) by mouth every 6 (six) hours as needed for nausea. Qty: 30 tablet, Refills: 0    pantoprazole (PROTONIX) 40 MG tablet Take 1 tablet (40 mg total) by mouth 2 (two) times daily before a meal. Qty: 30 tablet, Refills: 0      CONTINUE these medications which have NOT CHANGED   Details  albuterol (PROVENTIL HFA;VENTOLIN HFA) 108 (90 BASE) MCG/ACT inhaler Inhale 2 puffs into the lungs every 6 (six) hours as needed. Shortness of breath     budesonide-formoterol (SYMBICORT) 80-4.5 MCG/ACT inhaler Inhale 2 puffs into the lungs 2 (two) times daily. Qty: 1 Inhaler, Refills: 12   Associated Diagnoses: Unspecified asthma    dicyclomine (BENTYL) 20 MG tablet Take 1 tablet (20 mg total) by mouth 2 (two) times daily. Qty: 20 tablet, Refills: 0    DULoxetine (CYMBALTA) 30 MG capsule Take 1 capsule (30 mg total) by mouth daily. Qty: 30 capsule, Refills: 2   Associated Diagnoses: Depressive disorder, not elsewhere classified    metoCLOPramide (REGLAN) 10 MG tablet Take 1 tablet (10 mg total) by mouth every 6 (six) hours. As needed for nausea and vomiting. Qty: 30 tablet, Refills: 0    omeprazole  (PRILOSEC) 20 MG capsule Take 1 capsule (20 mg total) by mouth daily. Qty: 14 capsule, Refills: 0   Associated Diagnoses: H. pylori infection    ranitidine (ZANTAC) 150 MG capsule Take 1 capsule (150 mg total) by mouth 2 (two) times daily. Qty: 30 capsule, Refills: 0      STOP taking these medications     HYDROcodone-acetaminophen (VICODIN) 5-500 MG per tablet         Disposition and Follow-up:  Follow up with PCP in 1 week  has follow up appt with Ms Melvyn Neth , NP (GI) in January awating outpatient appt at [facility] for EGD with botulinum injection of her pyloric hypertrophy for end of this week or  early next week  Consults:  Eula Listen( GI)  Significant Diagnostic Studies:  Dg Abd Acute W/chest  12/24/2010  *RADIOLOGY REPORT*  Clinical Data: Epigastric pain, vomiting  ACUTE ABDOMEN SERIES (ABDOMEN 2 VIEW & CHEST 1 VIEW)  Comparison: CT abdomen pelvis of 12/18/2010 and chest x-ray of 02/08/2009  Findings: The lungs are clear.  Mediastinal contours appear stable. The heart is within normal limits in size.  There is a thoracolumbar scoliosis present.  Supine and erect views of the abdomen show no bowel obstruction. There are a few scattered air-fluid levels in the right lower quadrant of questionable significance.  A local inflammatory process cannot be excluded.  No free air is noted on the erect view.  Surgical clips are present in the right upper quadrant from prior cholecystectomy.  IMPRESSION:  1.  No active lung disease. 2.  No bowel obstruction or free air.  Scattered air-fluid levels in the right lower quadrant.  Cannot exclude a local inflammatory process. 3.  Thoracolumbar scoliosis.  Original Report Authenticated By: Juline Patch, M.D.    Brief H and P: For complete details please refer to admission H and P, but in brief 30 y/o female who was directly admitted from the gastroenterologists office for intractable nausea and vomiting. The patient reports that her symptoms began  on Thursday, which was approximately one week ago. She had the onset of her vomiting as well as epigastric pain. She had come to the emergency room for evaluation had some relief with Reglan and was sent home. A CT scan had been done at that time which was unremarkable for any acute pathology. She had recurrence of her symptoms and had to come back to the emergency room again. She was set up for an outpatient appointment with the gastroenterology group. Patient presented to her outpatient appointment today was noted to have intractable nausea and vomiting. It was also noted that she did have some small amount of blood after she had vomited several times. She reports having some diarrhea but has not had any episodes since Thursday. She denies any fever or cough. She does have some shortness of breath when her abdominal pain acts up. The patient was also noted to be hypokalemic from her persistent vomiting. She is admitted to the hospital for further workup.   Physical Exam on Discharge:  Filed Vitals:   12/31/10 1946 12/31/10 2017 01/01/11 0537 01/01/11 0808  BP:  128/92 132/90   Pulse:  68 69   Temp:  98 F (36.7 C) 98.2 F (36.8 C)   TempSrc:      Resp:  20 20   Height:      Weight:      SpO2: 98% 100% 100% 99%     Intake/Output Summary (Last 24 hours) at 01/01/11 1150 Last data filed at 01/01/11 0900  Gross per 24 hour  Intake   2520 ml  Output    450 ml  Net   2070 ml    General: Alert, awake, oriented x3, in no acute distress. HEENT: No Pallor, no icterus, moist oral mucosa Heart: Regular rate and rhythm, without murmurs, rubs, gallops. Lungs: Clear to auscultation bilaterally. Abdomen: Soft, epigastric tenderness to palpation.  nondistended, positive bowel sounds. Extremities: No clubbing cyanosis or edema with positive pedal pulses. Neuro: Grossly intact, nonfocal.  CBC:    Component Value Date/Time   WBC 6.0 12/31/2010 0506   HGB 13.6 12/31/2010 0506   HCT 41.0  12/31/2010 0506   PLT 240 12/31/2010 0506   MCV 89.7 12/31/2010 0506   NEUTROABS 4.2 12/24/2010 1459   LYMPHSABS 2.1 12/24/2010 1459   MONOABS 1.3* 12/24/2010 1459   EOSABS 0.0 12/24/2010 1459   BASOSABS 0.0 12/24/2010 1459    Basic Metabolic Panel:    Component Value Date/Time   NA 137 01/01/2011 0453   K 4.5 01/01/2011 0453   CL 97 01/01/2011 0453   CO2 34* 01/01/2011 0453   BUN 8 01/01/2011 0453   CREATININE 1.02 01/01/2011 0453   GLUCOSE 88 01/01/2011 0453   CALCIUM 10.6* 01/01/2011 0453    Hospital Course:     PLAN:  Abdominal pain with pyloric spasms and gastric outlet obstruction EGD done showed moderate gastritis with possible gastric outlet obstruction sue to pylorospasm. Gastric emptying study showed delayed gastric emptying. Since  her symptoms did not improve and was not tolerating diet GI ( Dr Darrick Penna) recommended transferring her to [facility] for EGD with botulinum injection. She had upper GI series done on 12/11 showing Muscular hypertrophy of pylorus vs pylorospasm. . In the mean time she is tolerating full liquids but continues to have epigastric pain.   Gi recommends d/c with outpatient admit to [facility] for procedure if that would expediter the process. awaiting to hear on outpatient appointment at [facility] . Nausea and vomiting now stable  Cont pain control and antiemetics.  Cont PPI and dicyclomine  Patient can be discharged home today and will likely have appointment at [facility] scheduled before she leaves here.   Asthma  stable cont home meds   Depression: stable cont cymbalta   Hypokalemia : resolved   Time spent on Discharge: 45 minutes  Signed: Debbera Wolken 01/01/2011, 11:50 AM

## 2011-01-01 NOTE — Progress Notes (Signed)
Pt has held down meals so far today - now on oral Zofran. Spoke with Dr. Alycia Rossetti [facility] health); he feels pt may have a variant of gastroparesis. If she can be transitioned to the outpt setting, he'll plan to get her over there 12/17 for an EGG to assess further for an underlying motility disorder before deciding about BoTox or other therapuetic intervention. He will call Dr. Darrick Penna in am to check on pt's status.    Dr. Coral Else beeper number [phone]

## 2011-01-01 NOTE — Progress Notes (Signed)
Subjective:  Feeling okay. Abdominal pain stable and no vomiting. Tolerating full liquids. Taking PO pain medication and antiemetics (IV).   Objective: Vital signs in last 24 hours: Temp:  [97.9 F (36.6 C)-98.2 F (36.8 C)] 98.2 F (36.8 C) (12/13 0537) Pulse Rate:  [61-69] 69  (12/13 0537) Resp:  [18-20] 20  (12/13 0537) BP: (128-132)/(84-92) 132/90 mmHg (12/13 0537) SpO2:  [98 %-100 %] 99 % (12/13 0808) Last BM Date: 12/27/10 (per pt) General:   Alert,  Well-developed, well-nourished, pleasant and cooperative in NAD. Would not get off the phone for evaluation.  Head:  Normocephalic and atraumatic. Eyes:  Sclera clear, no icterus.   Abdomen:  Soft, mild epigastric tenderness. Normal bowel sounds, without guarding, and without rebound.   Extremities:  Without clubbing, deformity or edema. Neurologic:  Alert and  oriented x4;  grossly normal neurologically. Skin:  Intact without significant lesions or rashes. Psych:  Alert and cooperative. Normal mood and affect.  Intake/Output from previous day: 12/12 0701 - 12/13 0700 In: 2524 [P.O.:2520; IV Piggyback:4] Out: 150 [Urine:150] Intake/Output this shift:    Lab Results: CBC  Basename 12/31/10 0506  WBC 6.0  HGB 13.6  HCT 41.0  MCV 89.7  PLT 240   BMET  Basename 01/01/11 0453 12/31/10 0506  NA 137 137  K 4.5 4.4  CL 97 97  CO2 34* 34*  GLUCOSE 88 90  BUN 8 12  CREATININE 1.02 1.12*  CALCIUM 10.6* 10.5     Assessment: GOO secondary to possible pyloric channel spasms. Gastroparesis likely secondary to GOO.   Plan: 1. Awaiting appointment information for EGD with Botox injections at [facility]. Should know something today. 2. Continue full liquid diet. 3. Continue antiemetics, trial of PO. D/C IV Zofran ATC. 4. Based on appointment information we can decide about possible D/C.   LOS: 8 days   Tana Coast  01/01/2011, 10:04 AM

## 2011-01-01 NOTE — Telephone Encounter (Signed)
Called [facility] to follow up with Ms Gunnarson's outpatient EGD w/ Botox- Per [facility] records reviewed by Dr Chesley Mires & Dr Alycia Rossetti- they will be contacting Dr RMR or [facility] in regards to the next step

## 2011-01-02 MED ORDER — DOCUSATE SODIUM 100 MG PO CAPS: 100.0000 mg | ORAL_CAPSULE | Freq: Two times a day (BID) | ORAL | Status: DC

## 2011-01-02 NOTE — Plan of Care (Signed)
Problem: Discharge Progression Outcomes Goal: Other Discharge Outcomes/Goals Outcome: Completed/Met Date Met:  01/02/11 I read the discharge instructions to pt . Pt was given follow up appt date and time.  I spoke with Dr Jeanella Flattery office and informed the pt that his office will contact her reguarding her follow up with [facility] hosp for procedure Pt was told to call the office if she needs.  Discharged to home with family

## 2011-01-02 NOTE — Progress Notes (Signed)
REVIEWED.  

## 2011-01-02 NOTE — Progress Notes (Signed)
Subjective:  No vomiting. PP epigastric tenderness. No BM since Saturday.   Objective: Vital signs in last 24 hours: Temp:  [97.6 F (36.4 C)-98.3 F (36.8 C)] 97.9 F (36.6 C) (12/14 0624) Pulse Rate:  [68-76] 68  (12/14 0624) Resp:  [16-20] 16  (12/14 0624) BP: (120-124)/(79-90) 123/85 mmHg (12/14 0624) SpO2:  [97 %-100 %] 100 % (12/14 0624) Weight:  [139 lb 6.4 oz (63.231 kg)] 139 lb 6.4 oz (63.231 kg) (12/14 0624) Last BM Date: 12/27/10 General:   Alert,  Well-developed, well-nourished, pleasant and cooperative in NAD Head:  Normocephalic and atraumatic. Eyes:  Sclera clear, no icterus.   Abdomen:  Soft, nondistended. Mild epigastric tenderness. No masses, hepatosplenomegaly or hernias noted. Normal bowel sounds, without guarding, and without rebound.   Extremities:  Without clubbing, deformity or edema. Neurologic:  Alert and  oriented x4;  grossly normal neurologically. Skin:  Intact without significant lesions or rashes. Psych:  Alert and cooperative. Normal mood and affect.  Intake/Output from previous day: 12/13 0701 - 12/14 0700 In: 1200 [P.O.:1200] Out: 875 [Urine:875] Intake/Output this shift:    Lab Results: CBC  Basename 12/31/10 0506  WBC 6.0  HGB 13.6  HCT 41.0  MCV 89.7  PLT 240   BMET  Basename 01/01/11 0453 12/31/10 0506  NA 137 137  K 4.5 4.4  CL 97 97  CO2 34* 34*  GLUCOSE 88 90  BUN 8 12  CREATININE 1.02 1.12*  CALCIUM 10.6* 10.5    Assessment: Gastric outlet obstruction secondary to pylorospasm +/- gastroparesis. Spoke with Dr. Jena Gauss. See his note from 01/01/11. He spoke with Dr. Claire Shown and he questions possibility of variant of gastroparesis. Wants to do EGG and if problems points to pylorus then administer botox.   Plan: 1. Patient can undergo EGG on 01/05/11 as an outpatient if discharged. Clinically she is tolerating full liquids and is tolerating po zofran, hydrocodone. Would recommend D/C. Dr. Alycia Rossetti to call with appointment  information today. 2. Will stop bentyl for constipation and urinary hesitancy. 3. Go home on oral zofran before meals and hydrocodone prn. 4. Add stool softner. May use miralax as well.    LOS: 9 days   Tana Coast  01/02/2011, 8:12 AM

## 2011-01-06 ENCOUNTER — Encounter (HOSPITAL_COMMUNITY): Payer: Self-pay | Admitting: Gastroenterology

## 2011-01-21 NOTE — Progress Notes (Signed)
RMR PT 

## 2011-01-29 ENCOUNTER — Telehealth: Payer: Self-pay

## 2011-01-29 NOTE — Telephone Encounter (Signed)
[facility]- PT IS SCHEDULED FOR 02/06/11

## 2011-01-29 NOTE — Telephone Encounter (Signed)
Do you know if pt was seen at [facility]?

## 2011-01-30 ENCOUNTER — Ambulatory Visit (INDEPENDENT_AMBULATORY_CARE_PROVIDER_SITE_OTHER): Payer: Medicare Other | Admitting: Gastroenterology

## 2011-01-30 ENCOUNTER — Encounter: Payer: Self-pay | Admitting: Gastroenterology

## 2011-01-30 VITALS — BP 118/72 | HR 72 | Temp 95.9°F | Ht 66.0 in | Wt 138.6 lb

## 2011-01-30 DIAGNOSIS — K313 Pylorospasm, not elsewhere classified: Secondary | ICD-10-CM | POA: Diagnosis not present

## 2011-01-30 DIAGNOSIS — D649 Anemia, unspecified: Secondary | ICD-10-CM | POA: Diagnosis not present

## 2011-01-30 DIAGNOSIS — K219 Gastro-esophageal reflux disease without esophagitis: Secondary | ICD-10-CM | POA: Diagnosis not present

## 2011-01-30 DIAGNOSIS — K311 Adult hypertrophic pyloric stenosis: Secondary | ICD-10-CM

## 2011-01-30 LAB — CBC WITH DIFFERENTIAL/PLATELET
Basophils Absolute: 0 10*3/uL (ref 0.0–0.1)
HCT: 33.4 % — ABNORMAL LOW (ref 36.0–46.0)
Hemoglobin: 10.8 g/dL — ABNORMAL LOW (ref 12.0–15.0)
Lymphocytes Relative: 24 % (ref 12–46)
Monocytes Absolute: 0.7 10*3/uL (ref 0.1–1.0)
Neutro Abs: 6.5 10*3/uL (ref 1.7–7.7)
Neutrophils Relative %: 67 % (ref 43–77)
RDW: 13.6 % (ref 11.5–15.5)
WBC: 9.7 10*3/uL (ref 4.0–10.5)

## 2011-01-30 MED ORDER — PANTOPRAZOLE SODIUM 40 MG PO TBEC: 40.0000 mg | DELAYED_RELEASE_TABLET | Freq: Every day | ORAL | Status: DC

## 2011-01-30 NOTE — Progress Notes (Signed)
Please let patient know, she does need to be on bentyl per Dr. Coral Else note. Should be on liquid bentyl 10mg  qid. If she needs RX, let me know.

## 2011-01-30 NOTE — Assessment & Plan Note (Addendum)
GERD/gastritis. Patient currently not on PPI. Add pantoprazole 40mg  daily. RX sent to pharmacy. Sample of nexium #5 provided until she can get PPI rx filled. Avoid fatty/fried foods.

## 2011-01-30 NOTE — Assessment & Plan Note (Addendum)
Continue to follow up with Dr. Claire Shown for upcoming tests. Patient should be on liquid bentyl 10mg  qid per Dr. Coral Else note. We will have her restart it.

## 2011-01-30 NOTE — Progress Notes (Signed)
Primary Care Physician: Syliva Overman, MD, MD  Primary Gastroenterologist:  Roetta Sessions, MD   Chief Complaint  Patient presents with  . Hospital follow up    pylorospasm    HPI: Tracey Daniel is a 31 y.o. female here for follow up recent hospitalization. She recently was diagnosed with gastric outlet obstruction due to pylorospasm. She had EGD with Botox injection by Dr. Beverly Gust on 01/08/2011. She is scheduled for EGG and GES In gastric emptying and study in the next couple of weeks as well. She has less abdominal pain. Still cannot eat as much as she would like. Last episode of vomiting was 01/13/11, states she overate. No melena, brbpr. Less diarrhea. One to two soft stools daily. C/O daily episodes of feeling hot, sweaty, nausea which lasts for one hour. Recent labs during hospitalization showed unremarkable TSH, cortisol am, hgba1c. Her H/H was low.  Current Outpatient Prescriptions  Medication Sig Dispense Refill  . albuterol (PROVENTIL HFA;VENTOLIN HFA) 108 (90 BASE) MCG/ACT inhaler Inhale 2 puffs into the lungs every 6 (six) hours as needed. Shortness of breath       . budesonide-formoterol (SYMBICORT) 80-4.5 MCG/ACT inhaler Inhale 2 puffs into the lungs 2 (two) times daily.  1 Inhaler  12  . cyclobenzaprine (FLEXERIL) 10 MG tablet Take 10 mg by mouth 3 (three) times daily as needed.      . DULoxetine (CYMBALTA) 30 MG capsule Take 1 capsule (30 mg total) by mouth daily.  30 capsule  2  . estradiol (ESTRACE VAGINAL) 0.1 MG/GM vaginal cream Place 2 g vaginally daily. As directed      . estradiol-norethindrone (ACTIVELLA) 1-0.5 MG per tablet Take 1 tablet by mouth daily.      . feeding supplement (ENSURE CLINICAL STRENGTH) LIQD Take 237 mLs by mouth 3 (three) times daily with meals.  10 Bottle  3  . HYDROcodone-acetaminophen (NORCO) 7.5-325 MG per tablet Take 1 tablet by mouth every 4 (four) hours as needed.       . pantoprazole (PROTONIX) 40 MG tablet Take 1 tablet (40 mg  total) by mouth daily.  30 tablet  11    Allergies as of 01/30/2011 - Review Complete 01/30/2011  Allergen Reaction Noted  . Penicillins      ROS:  General: Negative for anorexia, weight loss, fever, chills, fatigue, weakness. C/O daily episodes of feeling hot followed by sweats and nausea.  ENT: Negative for hoarseness, difficulty swallowing , nasal congestion. CV: Negative for chest pain, angina, palpitations, dyspnea on exertion, peripheral edema.  Respiratory: Negative for dyspnea at rest, dyspnea on exertion, cough, sputum, wheezing.  GI: See history of present illness. GU:  Negative for dysuria, hematuria, urinary incontinence, urinary frequency, nocturnal urination.  Endo: Negative for unusual weight change.    Physical Examination:   BP 118/72  Pulse 72  Temp(Src) 95.9 F (35.5 C) (Tympanic)  LMP 01/19/1997  General: Well-nourished, well-developed in no acute distress.  Eyes: No icterus. Mouth: Oropharyngeal mucosa moist and pink , no lesions erythema or exudate. Lungs: Clear to auscultation bilaterally.  Heart: Regular rate and rhythm, no murmurs rubs or gallops.  Abdomen: Bowel sounds are normal, nontender, nondistended, no hepatosplenomegaly or masses, no abdominal bruits or hernia , no rebound or guarding.   Extremities: No lower extremity edema. No clubbing or deformities. Neuro: Alert and oriented x 4   Skin: Warm and dry, no jaundice.   Psych: Alert and cooperative, normal mood and affect.

## 2011-01-30 NOTE — Patient Instructions (Signed)
Please start pantoprazole once daily. I sent RX to your pharmacy. I have given you samples of nexium in case there is a delay in getting the pantoprazole filled. Both medications are once daily.  Please have your blood work done.  Please keep all appointments with Dr. Alycia Rossetti at [facility].

## 2011-01-30 NOTE — Assessment & Plan Note (Addendum)
F/U on anemia. CBC. Patient with daily episodes of feeling hot, sweaty, nausea sometimes after meals. ?related to pylorospasm vs non-GI source. Patient reports symptoms began after egd with botox. She will mention to Dr. Alycia Rossetti as well.

## 2011-02-02 ENCOUNTER — Telehealth: Payer: Self-pay

## 2011-02-02 MED ORDER — DICYCLOMINE HCL 10 MG/5ML PO SOLN: 10.0000 mg | Freq: Three times a day (TID) | ORAL | Status: DC

## 2011-02-02 NOTE — Progress Notes (Signed)
Note routed to pcp  

## 2011-02-02 NOTE — Progress Notes (Signed)
Pt stated she didn't know she was supposed to be on Bentyl. She will need rx sent to [facility] on [address] in [place].

## 2011-02-02 NOTE — Telephone Encounter (Signed)
Pt stated she didn't know she was supposed to be on Bentyl. She will need rx sent to [facility] on [address] in [place].  

## 2011-02-04 DIAGNOSIS — M5137 Other intervertebral disc degeneration, lumbosacral region: Secondary | ICD-10-CM | POA: Diagnosis not present

## 2011-02-05 ENCOUNTER — Other Ambulatory Visit: Payer: Self-pay

## 2011-02-05 DIAGNOSIS — D649 Anemia, unspecified: Secondary | ICD-10-CM

## 2011-02-05 DIAGNOSIS — D518 Other vitamin B12 deficiency anemias: Secondary | ICD-10-CM

## 2011-02-05 NOTE — Progress Notes (Signed)
Quick Note:  Hgb down.  Needs anemia panel. Ifobt. Find out what your menstrual cycles are like. ?how many heavy days, ?how many days long, ?how frequent. ______

## 2011-02-06 DIAGNOSIS — R112 Nausea with vomiting, unspecified: Secondary | ICD-10-CM | POA: Diagnosis not present

## 2011-02-09 NOTE — Progress Notes (Signed)
Quick Note:  Informed pt. She said she has not had a menstrual cycle since about age 31. At age 31 she was told that she was in premature menopause. She will come by and pick up iFOBT which will be at front. I will fax lab orders to [facility] and she will try to get those done this week. ______

## 2011-02-10 DIAGNOSIS — R112 Nausea with vomiting, unspecified: Secondary | ICD-10-CM | POA: Diagnosis not present

## 2011-02-12 NOTE — Progress Notes (Signed)
Quick Note:  Noted. Await labs and ifobt. ______

## 2011-02-26 ENCOUNTER — Ambulatory Visit (INDEPENDENT_AMBULATORY_CARE_PROVIDER_SITE_OTHER): Payer: Medicare Other | Admitting: Gastroenterology

## 2011-02-26 ENCOUNTER — Other Ambulatory Visit: Payer: Self-pay | Admitting: Gastroenterology

## 2011-02-26 DIAGNOSIS — D649 Anemia, unspecified: Secondary | ICD-10-CM | POA: Diagnosis not present

## 2011-02-26 DIAGNOSIS — D518 Other vitamin B12 deficiency anemias: Secondary | ICD-10-CM | POA: Diagnosis not present

## 2011-02-27 ENCOUNTER — Other Ambulatory Visit: Payer: Self-pay

## 2011-02-27 DIAGNOSIS — D649 Anemia, unspecified: Secondary | ICD-10-CM

## 2011-02-27 LAB — FOLATE: Folate: 19.4 ng/mL

## 2011-02-27 LAB — IRON AND TIBC: %SAT: 21 % (ref 20–55)

## 2011-02-27 LAB — VITAMIN B12: Vitamin B-12: 458 pg/mL (ref 211–911)

## 2011-02-27 NOTE — Progress Notes (Signed)
Quick Note:  Please let patient know her anemia panel is good. ifobt negative. FE sat just little low. She needs to take ferrous sulfate 325mg  daily. Recheck CBC at first of April. If still low then, consider hematology consult. ______

## 2011-03-02 ENCOUNTER — Ambulatory Visit (INDEPENDENT_AMBULATORY_CARE_PROVIDER_SITE_OTHER): Payer: Medicare Other | Admitting: Family Medicine

## 2011-03-02 ENCOUNTER — Encounter: Payer: Self-pay | Admitting: Family Medicine

## 2011-03-02 VITALS — BP 112/74 | HR 64 | Resp 16 | Ht 66.0 in | Wt 137.8 lb

## 2011-03-02 DIAGNOSIS — K311 Adult hypertrophic pyloric stenosis: Secondary | ICD-10-CM

## 2011-03-02 DIAGNOSIS — M25551 Pain in right hip: Secondary | ICD-10-CM | POA: Insufficient documentation

## 2011-03-02 DIAGNOSIS — F329 Major depressive disorder, single episode, unspecified: Secondary | ICD-10-CM

## 2011-03-02 DIAGNOSIS — M899 Disorder of bone, unspecified: Secondary | ICD-10-CM

## 2011-03-02 DIAGNOSIS — M949 Disorder of cartilage, unspecified: Secondary | ICD-10-CM

## 2011-03-02 DIAGNOSIS — M25559 Pain in unspecified hip: Secondary | ICD-10-CM

## 2011-03-02 DIAGNOSIS — F3289 Other specified depressive episodes: Secondary | ICD-10-CM

## 2011-03-02 DIAGNOSIS — J45909 Unspecified asthma, uncomplicated: Secondary | ICD-10-CM | POA: Diagnosis not present

## 2011-03-02 DIAGNOSIS — N3000 Acute cystitis without hematuria: Secondary | ICD-10-CM

## 2011-03-02 DIAGNOSIS — R5381 Other malaise: Secondary | ICD-10-CM

## 2011-03-02 DIAGNOSIS — Z79899 Other long term (current) drug therapy: Secondary | ICD-10-CM

## 2011-03-02 LAB — POCT URINALYSIS DIPSTICK
Protein, UA: NEGATIVE
Spec Grav, UA: 1.025
Urobilinogen, UA: 0.2
pH, UA: 6.5

## 2011-03-02 NOTE — Progress Notes (Signed)
  Subjective:    Patient ID: Tracey Daniel, female    DOB: 08/16/1980, 31 y.o.   MRN: [id number]  HPI The PT is here for follow up and re-evaluation of chronic medical conditions, medication management and review of any available recent lab and radiology data.  Preventive health is updated, specifically  Cancer screening and Immunization.   Questions or concerns regarding consultations or procedures which the PT has had in the interim are  Addressed.she was hospitalized in Dec for approx 1 week for intractable abdominal pain and vomiting. She has subsequently had a procedure ar [facility] and reports improvement, states she had Botox treatment The PT denies any adverse reactions to current medications since the last visit.  3 day h/o foul smelling urine, no fever , chills or flank pain    Review of Systems    See HPI Denies recent fever or chills. Denies sinus pressure, nasal congestion, ear pain or sore throat. Denies chest congestion, productive cough or wheezing. Denies chest pains, palpitations and leg swelling Denies abdominal pain, nausea, vomiting,diarrhea or constipation.    Chronic right hip pain followed by ortho Denies headaches, seizures, numbness, or tingling. Denies depression, anxiety or insomnia. Denies skin break down or rash.     Objective:   Physical Exam  Patient alert and oriented and in no cardiopulmonary distress.  HEENT: No facial asymmetry, EOMI, no sinus tenderness,  oropharynx pink and moist.  Neck supple no adenopathy.  Chest: Clear to auscultation bilaterally.  CVS: S1, S2 no murmurs, no S3.  ABD: Soft non tender. Bowel sounds normal.  Ext: No edema  MS: Adequate ROM spine, shoulders,decreased in right hip  Skin: Intact, no ulcerations or rash noted.  Psych: Good eye contact, normal affect. Memory intact not anxious or depressed appearing.  CNS: CN 2-12 intact, power, tone and sensation normal throughout.       Assessment &  Plan:

## 2011-03-02 NOTE — Assessment & Plan Note (Signed)
Controlled, no change in medication  

## 2011-03-02 NOTE — Patient Instructions (Signed)
F/u in 6 month  Call if you need me before  Fasting lipid, cbc, and vit d in 6 months.  i an happy you feel better.  Pap is due ebd march, call gynae for appt. Drink 60 ounces water daily

## 2011-03-02 NOTE — Assessment & Plan Note (Signed)
Reports improved symptoms following procedure at [facility] in dec 2012, followed by gI

## 2011-03-02 NOTE — Assessment & Plan Note (Signed)
On chronic pain medicine from dr Hilda Lias, reports instability at times

## 2011-03-02 NOTE — Assessment & Plan Note (Signed)
Check ccua, pt advised to ensure she drinks at least 60 ounces water daily

## 2011-03-02 NOTE — Progress Notes (Signed)
See result note.  

## 2011-03-04 ENCOUNTER — Other Ambulatory Visit: Payer: Self-pay | Admitting: Gastroenterology

## 2011-03-04 DIAGNOSIS — D649 Anemia, unspecified: Secondary | ICD-10-CM

## 2011-03-18 DIAGNOSIS — M5137 Other intervertebral disc degeneration, lumbosacral region: Secondary | ICD-10-CM | POA: Diagnosis not present

## 2011-03-18 DIAGNOSIS — M545 Low back pain: Secondary | ICD-10-CM | POA: Diagnosis not present

## 2011-03-25 ENCOUNTER — Encounter (HOSPITAL_COMMUNITY): Payer: Self-pay | Admitting: *Deleted

## 2011-03-25 ENCOUNTER — Inpatient Hospital Stay (HOSPITAL_COMMUNITY)
Admission: EM | Admit: 2011-03-25 | Discharge: 2011-03-31 | DRG: 392 | Disposition: A | Discharge status: Home / Self Care | Payer: Medicare Other | Attending: Internal Medicine | Admitting: Internal Medicine

## 2011-03-25 ENCOUNTER — Emergency Department (HOSPITAL_COMMUNITY): Payer: Medicare Other

## 2011-03-25 DIAGNOSIS — K219 Gastro-esophageal reflux disease without esophagitis: Secondary | ICD-10-CM | POA: Diagnosis not present

## 2011-03-25 DIAGNOSIS — R1110 Vomiting, unspecified: Secondary | ICD-10-CM | POA: Diagnosis not present

## 2011-03-25 DIAGNOSIS — K529 Noninfective gastroenteritis and colitis, unspecified: Secondary | ICD-10-CM

## 2011-03-25 DIAGNOSIS — F3289 Other specified depressive episodes: Secondary | ICD-10-CM | POA: Diagnosis not present

## 2011-03-25 DIAGNOSIS — R110 Nausea: Secondary | ICD-10-CM | POA: Diagnosis not present

## 2011-03-25 DIAGNOSIS — E876 Hypokalemia: Secondary | ICD-10-CM | POA: Diagnosis not present

## 2011-03-25 DIAGNOSIS — D72829 Elevated white blood cell count, unspecified: Secondary | ICD-10-CM | POA: Diagnosis present

## 2011-03-25 DIAGNOSIS — R109 Unspecified abdominal pain: Secondary | ICD-10-CM | POA: Diagnosis not present

## 2011-03-25 DIAGNOSIS — Z79899 Other long term (current) drug therapy: Secondary | ICD-10-CM | POA: Diagnosis not present

## 2011-03-25 DIAGNOSIS — M79609 Pain in unspecified limb: Secondary | ICD-10-CM

## 2011-03-25 DIAGNOSIS — A09 Infectious gastroenteritis and colitis, unspecified: Secondary | ICD-10-CM | POA: Diagnosis present

## 2011-03-25 DIAGNOSIS — M549 Dorsalgia, unspecified: Secondary | ICD-10-CM

## 2011-03-25 DIAGNOSIS — J309 Allergic rhinitis, unspecified: Secondary | ICD-10-CM | POA: Diagnosis present

## 2011-03-25 DIAGNOSIS — J45909 Unspecified asthma, uncomplicated: Secondary | ICD-10-CM | POA: Diagnosis not present

## 2011-03-25 DIAGNOSIS — F329 Major depressive disorder, single episode, unspecified: Secondary | ICD-10-CM | POA: Diagnosis present

## 2011-03-25 DIAGNOSIS — G8929 Other chronic pain: Secondary | ICD-10-CM | POA: Diagnosis present

## 2011-03-25 DIAGNOSIS — K313 Pylorospasm, not elsewhere classified: Secondary | ICD-10-CM | POA: Diagnosis not present

## 2011-03-25 DIAGNOSIS — D649 Anemia, unspecified: Secondary | ICD-10-CM

## 2011-03-25 DIAGNOSIS — F32A Depression, unspecified: Secondary | ICD-10-CM | POA: Diagnosis present

## 2011-03-25 DIAGNOSIS — R112 Nausea with vomiting, unspecified: Secondary | ICD-10-CM | POA: Diagnosis not present

## 2011-03-25 DIAGNOSIS — R1013 Epigastric pain: Secondary | ICD-10-CM

## 2011-03-25 DIAGNOSIS — L259 Unspecified contact dermatitis, unspecified cause: Secondary | ICD-10-CM

## 2011-03-25 DIAGNOSIS — R1115 Cyclical vomiting syndrome unrelated to migraine: Secondary | ICD-10-CM | POA: Diagnosis not present

## 2011-03-25 DIAGNOSIS — N912 Amenorrhea, unspecified: Secondary | ICD-10-CM

## 2011-03-25 DIAGNOSIS — N289 Disorder of kidney and ureter, unspecified: Secondary | ICD-10-CM | POA: Diagnosis not present

## 2011-03-25 DIAGNOSIS — R5381 Other malaise: Secondary | ICD-10-CM

## 2011-03-25 DIAGNOSIS — J209 Acute bronchitis, unspecified: Secondary | ICD-10-CM

## 2011-03-25 DIAGNOSIS — M25551 Pain in right hip: Secondary | ICD-10-CM

## 2011-03-25 DIAGNOSIS — K5900 Constipation, unspecified: Secondary | ICD-10-CM

## 2011-03-25 DIAGNOSIS — M810 Age-related osteoporosis without current pathological fracture: Secondary | ICD-10-CM | POA: Diagnosis present

## 2011-03-25 DIAGNOSIS — A048 Other specified bacterial intestinal infections: Secondary | ICD-10-CM

## 2011-03-25 DIAGNOSIS — R1084 Generalized abdominal pain: Secondary | ICD-10-CM | POA: Diagnosis not present

## 2011-03-25 DIAGNOSIS — K311 Adult hypertrophic pyloric stenosis: Secondary | ICD-10-CM | POA: Diagnosis not present

## 2011-03-25 DIAGNOSIS — R51 Headache: Secondary | ICD-10-CM

## 2011-03-25 DIAGNOSIS — E860 Dehydration: Secondary | ICD-10-CM | POA: Diagnosis present

## 2011-03-25 DIAGNOSIS — G43909 Migraine, unspecified, not intractable, without status migrainosus: Secondary | ICD-10-CM | POA: Diagnosis present

## 2011-03-25 DIAGNOSIS — N3000 Acute cystitis without hematuria: Secondary | ICD-10-CM

## 2011-03-25 DIAGNOSIS — R21 Rash and other nonspecific skin eruption: Secondary | ICD-10-CM

## 2011-03-25 DIAGNOSIS — S92309A Fracture of unspecified metatarsal bone(s), unspecified foot, initial encounter for closed fracture: Secondary | ICD-10-CM

## 2011-03-25 HISTORY — DX: Pylorospasm, not elsewhere classified: K31.3

## 2011-03-25 LAB — DIFFERENTIAL
Lymphocytes Relative: 13 % (ref 12–46)
Lymphs Abs: 2.5 10*3/uL (ref 0.7–4.0)
Neutrophils Relative %: 80 % — ABNORMAL HIGH (ref 43–77)

## 2011-03-25 LAB — COMPREHENSIVE METABOLIC PANEL
ALT: 18 U/L (ref 0–35)
AST: 25 U/L (ref 0–37)
CO2: 27 mEq/L (ref 19–32)
Chloride: 94 mEq/L — ABNORMAL LOW (ref 96–112)
GFR calc non Af Amer: 87 mL/min — ABNORMAL LOW (ref >90)
Sodium: 135 mEq/L (ref 135–145)
Total Bilirubin: 0.4 mg/dL (ref 0.3–1.2)

## 2011-03-25 LAB — CBC
MCV: 88.6 fL (ref 78.0–100.0)
Platelets: 202 10*3/uL (ref 150–400)
RBC: 4.39 MIL/uL (ref 3.87–5.11)
WBC: 19.1 10*3/uL — ABNORMAL HIGH (ref 4.0–10.5)

## 2011-03-25 LAB — LIPASE, BLOOD: Lipase: 24 U/L (ref 11–59)

## 2011-03-25 MED ORDER — PANTOPRAZOLE SODIUM 40 MG IV SOLR
40.0000 mg | Freq: Once | INTRAVENOUS | Status: AC
  Administered 2011-03-25: 40 mg via INTRAVENOUS
  Filled 2011-03-25: qty 40

## 2011-03-25 MED ORDER — PROMETHAZINE HCL 25 MG/ML IJ SOLN
INTRAMUSCULAR | Status: AC
  Filled 2011-03-25: qty 1

## 2011-03-25 MED ORDER — CIPROFLOXACIN IN D5W 400 MG/200ML IV SOLN
400.0000 mg | Freq: Once | INTRAVENOUS | Status: AC
  Administered 2011-03-25: 400 mg via INTRAVENOUS
  Filled 2011-03-25: qty 200

## 2011-03-25 MED ORDER — PROMETHAZINE HCL 25 MG/ML IJ SOLN
INTRAMUSCULAR | Status: AC
  Administered 2011-03-25: 12.5 mg via INTRAVENOUS
  Filled 2011-03-25: qty 1

## 2011-03-25 MED ORDER — SODIUM CHLORIDE 0.9 % IV SOLN
INTRAVENOUS | Status: AC
  Administered 2011-03-25: 23:00:00 via INTRAVENOUS

## 2011-03-25 MED ORDER — PROMETHAZINE HCL 25 MG/ML IJ SOLN
25.0000 mg | Freq: Once | INTRAMUSCULAR | Status: AC
  Administered 2011-03-25: 12.5 mg via INTRAVENOUS
  Administered 2011-03-25: 25 mg via INTRAVENOUS

## 2011-03-25 MED ORDER — HYDROMORPHONE HCL PF 1 MG/ML IJ SOLN
1.0000 mg | INTRAMUSCULAR | Status: DC | PRN
  Administered 2011-03-25: 1 mg via INTRAVENOUS

## 2011-03-25 MED ORDER — METOCLOPRAMIDE HCL 5 MG/ML IJ SOLN
INTRAMUSCULAR | Status: AC
  Filled 2011-03-25: qty 2

## 2011-03-25 MED ORDER — SODIUM CHLORIDE 0.9 % IV BOLUS (SEPSIS)
1000.0000 mL | Freq: Once | INTRAVENOUS | Status: AC
  Administered 2011-03-25: 1000 mL via INTRAVENOUS

## 2011-03-25 MED ORDER — PROMETHAZINE HCL 25 MG/ML IJ SOLN
INTRAMUSCULAR | Status: AC
  Administered 2011-03-25: 25 mg via INTRAVENOUS
  Filled 2011-03-25: qty 1

## 2011-03-25 MED ORDER — METRONIDAZOLE IN NACL 5-0.79 MG/ML-% IV SOLN
500.0000 mg | Freq: Once | INTRAVENOUS | Status: AC
  Administered 2011-03-25: 500 mg via INTRAVENOUS
  Filled 2011-03-25: qty 100

## 2011-03-25 MED ORDER — IOHEXOL 300 MG/ML  SOLN
100.0000 mL | Freq: Once | INTRAMUSCULAR | Status: AC | PRN
  Administered 2011-03-25: 100 mL via INTRAVENOUS

## 2011-03-25 MED ORDER — ONDANSETRON HCL 4 MG/2ML IJ SOLN
4.0000 mg | Freq: Once | INTRAMUSCULAR | Status: AC
  Administered 2011-03-25: 4 mg via INTRAVENOUS
  Filled 2011-03-25: qty 2

## 2011-03-25 MED ORDER — HYDROMORPHONE HCL PF 1 MG/ML IJ SOLN
INTRAMUSCULAR | Status: AC
Start: 2011-03-25 — End: 2011-03-25
  Administered 2011-03-25: 1 mg via INTRAVENOUS
  Filled 2011-03-25: qty 1

## 2011-03-25 MED ORDER — METOCLOPRAMIDE HCL 5 MG/ML IJ SOLN
10.0000 mg | Freq: Once | INTRAMUSCULAR | Status: AC
  Administered 2011-03-25: 10 mg via INTRAVENOUS

## 2011-03-25 MED ORDER — PROMETHAZINE HCL 25 MG/ML IJ SOLN: 12.5000 mg | Freq: Once | INTRAMUSCULAR | Status: DC

## 2011-03-25 MED ORDER — HYDROMORPHONE HCL PF 1 MG/ML IJ SOLN
1.0000 mg | Freq: Once | INTRAMUSCULAR | Status: AC
Start: 2011-03-25 — End: 2011-03-25
  Administered 2011-03-25: 1 mg via INTRAVENOUS
  Filled 2011-03-25: qty 1

## 2011-03-25 MED ORDER — ONDANSETRON HCL 4 MG/2ML IJ SOLN: 4.0000 mg | Freq: Three times a day (TID) | INTRAMUSCULAR | Status: DC | PRN

## 2011-03-25 NOTE — ED Provider Notes (Signed)
History     CSN: [id number]  Arrival date & time 03/25/11  1632   First MD Initiated Contact with Patient 03/25/11 1739      Chief Complaint  Patient presents with  . Abdominal Pain    (Consider location/radiation/quality/duration/timing/severity/associated sxs/prior treatment) Patient is a 31 y.o. female presenting with abdominal pain. The history is provided by the patient (pt complains of abd pain and vomiting). No language interpreter was used.  Abdominal Pain The primary symptoms of the illness include abdominal pain. The primary symptoms of the illness do not include fever, fatigue or diarrhea. The current episode started 13 to 24 hours ago. The onset of the illness was sudden. The problem has not changed since onset. The abdominal pain began 13 to24 hours ago. The pain came on suddenly. The abdominal pain has been unchanged since its onset. The abdominal pain is generalized. The abdominal pain does not radiate. The severity of the abdominal pain is 6/10. The abdominal pain is relieved by nothing.  Associated with: nothing. The patient states that she believes she is currently not pregnant. The patient has had a change in bowel habit. Symptoms associated with the illness do not include chills, hematuria, frequency or back pain. Significant associated medical issues include PUD.    Past Medical History  Diagnosis Date  . Asthma   . Ulcer of abdomen wall     ???  . Migraines   . Osteoporosis   . Depression   . Seasonal allergies   . Sinusitis   . Back pain, chronic   . Nicotine addiction   . Constipation   . Vitamin d deficiency   . Gastritis   . Gastroparesis     Past Surgical History  Procedure Date  . Cholecystectomy     ?2002  . Laser surgery on cervix   . Carpal tunnel release     left hand  . Esophagogastroduodenoscopy 12/25/2010    Procedure: ESOPHAGOGASTRODUODENOSCOPY (EGD);  Surgeon: [name], MD;  moderate gastritis, ?goo secondary to pylorspasm. BX  showed reactive gstropathy no h.pyori, SB mucosa with intramucosal lymphocytosis and partial villous blunting (TTG 4.0 normal)  . Esophageal dilation 12/25/2010    Procedure: ESOPHAGEAL DILATION;  Surgeon: [name], MD;  Location: [facility];  Service: Endoscopy;;    Family History  Problem Relation Age of Onset  . Diabetes Father   . Liver disease Father     liver transplant at [facility], age 29  . Colon cancer Neg Hx   . Lung cancer Mother     History  Substance Use Topics  . Smoking status: Former Smoker -- 1.0 packs/day for 15 years    Types: Cigarettes  . Smokeless tobacco: Not on file   Comment: quit 2011  . Alcohol Use: No     none since July. Periodic heavy drinker for months at a time.    OB History    Grav Para Term Preterm Abortions TAB SAB Ect Mult Living                  Review of Systems  Constitutional: Negative for fever, chills and fatigue.  HENT: Negative for congestion, sinus pressure and ear discharge.   Eyes: Negative for discharge.  Respiratory: Negative for cough.   Cardiovascular: Negative for chest pain.  Gastrointestinal: Positive for abdominal pain. Negative for diarrhea.  Genitourinary: Negative for frequency and hematuria.  Musculoskeletal: Negative for back pain.  Skin: Negative for rash.  Neurological: Negative for seizures and  headaches.  Hematological: Negative.   Psychiatric/Behavioral: Negative for hallucinations.    Allergies  Penicillins  Home Medications   Current Outpatient Rx  Name Route Sig Dispense Refill  . ALBUTEROL SULFATE HFA 108 (90 BASE) MCG/ACT IN AERS Inhalation Inhale 2 puffs into the lungs every 6 (six) hours as needed. Shortness of breath     . BUDESONIDE-FORMOTEROL FUMARATE 80-4.5 MCG/ACT IN AERO Inhalation Inhale 2 puffs into the lungs 2 (two) times daily. 1 Inhaler 12  . CYCLOBENZAPRINE HCL 10 MG PO TABS Oral Take 10 mg by mouth 3 (three) times daily as needed. For muscle spasms    . DULOXETINE HCL 30 MG  PO CPEP Oral Take 1 capsule (30 mg total) by mouth daily. 30 capsule 2  . ESTRADIOL 0.1 MG/GM VA CREA Vaginal Place 2 g vaginally daily. As directed    . ESTRADIOL-NORETHINDRONE ACET 1-0.5 MG PO TABS Oral Take 1 tablet by mouth daily.    Marland Kitchen ENSURE CLINICAL ST REVIGOR PO LIQD Oral Take 237 mLs by mouth 3 (three) times daily with meals. 10 Bottle 3  . FERROUS SULFATE 325 (65 FE) MG PO TABS Oral Take 325 mg by mouth daily with breakfast.    . HYDROCODONE-ACETAMINOPHEN 7.5-325 MG PO TABS Oral Take 1 tablet by mouth every 4 (four) hours as needed.     Marland Kitchen PANTOPRAZOLE SODIUM 40 MG PO TBEC Oral Take 1 tablet (40 mg total) by mouth daily. 30 tablet 11  . FLINTSTONES COMPLETE 60 MG PO CHEW Oral Chew 1 tablet by mouth daily.      BP 150/122  Pulse 89  Resp 20  Ht 5\' 6"  (1.676 m)  Wt 137 lb (62.143 kg)  BMI 22.11 kg/m2  SpO2 100%  LMP 01/19/1997  Physical Exam  Constitutional: She is oriented to person, place, and time. She appears well-developed.  HENT:  Head: Normocephalic and atraumatic.  Eyes: Conjunctivae and EOM are normal. No scleral icterus.  Neck: Neck supple. No thyromegaly present.  Cardiovascular: Normal rate and regular rhythm.  Exam reveals no gallop and no friction rub.   No murmur heard. Pulmonary/Chest: No stridor. She has no wheezes. She has no rales. She exhibits no tenderness.  Abdominal: She exhibits no distension. There is tenderness. There is no rebound.       Mild tendernous throughout  Musculoskeletal: Normal range of motion. She exhibits no edema.  Lymphadenopathy:    She has no cervical adenopathy.  Neurological: She is oriented to person, place, and time. Coordination normal.  Skin: No rash noted. No erythema.  Psychiatric: She has a normal mood and affect. Her behavior is normal.    ED Course  Procedures (including critical care time)  Labs Reviewed  CBC - Abnormal; Notable for the following:    WBC 19.1 (*)    All other components within normal limits    DIFFERENTIAL - Abnormal; Notable for the following:    Neutrophils Relative 80 (*)    Neutro Abs 15.3 (*)    Monocytes Absolute 1.3 (*)    All other components within normal limits  COMPREHENSIVE METABOLIC PANEL - Abnormal; Notable for the following:    Chloride 94 (*)    Glucose, Bld 144 (*)    Calcium 11.4 (*)    Total Protein 8.7 (*)    GFR calc non Af Amer 87 (*)    All other components within normal limits  LIPASE, BLOOD   Ct Abdomen Pelvis W Contrast  03/25/2011  *RADIOLOGY REPORT*  Clinical Data:  Abdominal pain and vomiting.  CT ABDOMEN AND PELVIS WITH CONTRAST  Technique:  Multidetector CT imaging of the abdomen and pelvis was performed following the standard protocol during bolus administration of intravenous contrast.  Contrast: OMNIPAQUE IOHEXOL 300 MG/ML IJ SOLN  Comparison: CT 12/18/2010  Findings: Lung bases are clear.  Gallbladder has been removed. Liver and bile ducts are normal.  Pancreas spleen and kidneys are normal.  The colon is decompressed but appears to be thickened.  Some this could be due to collapse however I am concerned that could be colitis present.  If present, this is most likely mild.  No free fluid or abscess is present.  Negative for bowel obstruction. Appendix is normal.  IMPRESSION: Possible thickening of the colon diffusely suggestive of colitis. Otherwise negative.  Original Report Authenticated By: Camelia Phenes, M.D.   Dg Abd Acute W/chest  03/25/2011  *RADIOLOGY REPORT*  Clinical Data: Abdominal pain and vomiting  ACUTE ABDOMEN SERIES (ABDOMEN 2 VIEW & CHEST 1 VIEW)  Comparison: 12/26/2010  Findings: Heart size is normal and the vascularity is normal. Lungs are clear.  Normal bowel gas pattern.  Negative for free intraperitoneal gas. Surgical clips in the gallbladder fossa.  No renal calculi.  No bony abnormality.  IMPRESSION: Negative  Original Report Authenticated By: Camelia Phenes, M.D.     No diagnosis found.    MDM           Benny Lennert, MD 04/01/11 [phone]

## 2011-03-25 NOTE — ED Notes (Signed)
Abdominal pain with nausea and vomiting onset today 

## 2011-03-26 LAB — CBC
MCV: 87.9 fL (ref 78.0–100.0)
Platelets: 178 10*3/uL (ref 150–400)
RDW: 13.2 % (ref 11.5–15.5)
WBC: 12.5 10*3/uL — ABNORMAL HIGH (ref 4.0–10.5)

## 2011-03-26 LAB — URINALYSIS, ROUTINE W REFLEX MICROSCOPIC
Leukocytes, UA: NEGATIVE
Nitrite: NEGATIVE
Protein, ur: NEGATIVE mg/dL
Urobilinogen, UA: 0.2 mg/dL (ref 0.0–1.0)

## 2011-03-26 LAB — MAGNESIUM: Magnesium: 1.6 mg/dL (ref 1.5–2.5)

## 2011-03-26 LAB — COMPREHENSIVE METABOLIC PANEL
AST: 22 U/L (ref 0–37)
Albumin: 4.4 g/dL (ref 3.5–5.2)
Chloride: 98 mEq/L (ref 96–112)
Creatinine, Ser: 0.68 mg/dL (ref 0.50–1.10)
Potassium: 3.5 mEq/L (ref 3.5–5.1)
Total Bilirubin: 0.3 mg/dL (ref 0.3–1.2)

## 2011-03-26 MED ORDER — SODIUM CHLORIDE 0.9 % IV BOLUS (SEPSIS)
1000.0000 mL | Freq: Once | INTRAVENOUS | Status: AC
  Administered 2011-03-26: 1000 mL via INTRAVENOUS

## 2011-03-26 MED ORDER — ACETAMINOPHEN 650 MG RE SUPP: 650.0000 mg | Freq: Four times a day (QID) | RECTAL | Status: DC | PRN

## 2011-03-26 MED ORDER — CIPROFLOXACIN IN D5W 400 MG/200ML IV SOLN
400.0000 mg | Freq: Two times a day (BID) | INTRAVENOUS | Status: DC
  Administered 2011-03-26 – 2011-03-30 (×9): 400 mg via INTRAVENOUS
  Filled 2011-03-26 (×12): qty 200

## 2011-03-26 MED ORDER — OXYCODONE HCL 5 MG PO TABS
5.0000 mg | ORAL_TABLET | ORAL | Status: DC | PRN
  Administered 2011-03-26 – 2011-03-28 (×8): 5 mg via ORAL
  Filled 2011-03-26 (×8): qty 1

## 2011-03-26 MED ORDER — ENOXAPARIN SODIUM 40 MG/0.4ML ~~LOC~~ SOLN
40.0000 mg | Freq: Every day | SUBCUTANEOUS | Status: DC
  Administered 2011-03-26 – 2011-03-31 (×6): 40 mg via SUBCUTANEOUS
  Filled 2011-03-26 (×6): qty 0.4

## 2011-03-26 MED ORDER — POTASSIUM CHLORIDE IN NACL 20-0.9 MEQ/L-% IV SOLN
INTRAVENOUS | Status: DC
  Administered 2011-03-26 – 2011-03-27 (×2): via INTRAVENOUS

## 2011-03-26 MED ORDER — METRONIDAZOLE IN NACL 5-0.79 MG/ML-% IV SOLN
500.0000 mg | Freq: Three times a day (TID) | INTRAVENOUS | Status: DC
  Administered 2011-03-26 – 2011-03-28 (×8): 500 mg via INTRAVENOUS
  Filled 2011-03-26 (×15): qty 100

## 2011-03-26 MED ORDER — METOCLOPRAMIDE HCL 5 MG/ML IJ SOLN
10.0000 mg | Freq: Four times a day (QID) | INTRAMUSCULAR | Status: DC
  Administered 2011-03-26 (×3): 10 mg via INTRAVENOUS
  Filled 2011-03-26 (×3): qty 2

## 2011-03-26 MED ORDER — ACETAMINOPHEN 325 MG PO TABS: 650.0000 mg | ORAL_TABLET | Freq: Four times a day (QID) | ORAL | Status: DC | PRN

## 2011-03-26 MED ORDER — PANTOPRAZOLE SODIUM 40 MG IV SOLR
40.0000 mg | Freq: Two times a day (BID) | INTRAVENOUS | Status: DC
  Administered 2011-03-26: 40 mg via INTRAVENOUS
  Filled 2011-03-26: qty 40

## 2011-03-26 MED ORDER — DULOXETINE HCL 30 MG PO CPEP
30.0000 mg | ORAL_CAPSULE | Freq: Every day | ORAL | Status: DC
  Administered 2011-03-26 – 2011-03-28 (×3): 30 mg via ORAL
  Filled 2011-03-26 (×3): qty 1

## 2011-03-26 MED ORDER — SODIUM CHLORIDE 0.9 % IJ SOLN
INTRAMUSCULAR | Status: AC
  Filled 2011-03-26: qty 3

## 2011-03-26 MED ORDER — PROMETHAZINE HCL 25 MG/ML IJ SOLN
12.5000 mg | INTRAMUSCULAR | Status: DC | PRN
  Administered 2011-03-26 – 2011-03-28 (×6): 12.5 mg via INTRAVENOUS
  Filled 2011-03-26 (×6): qty 1

## 2011-03-26 MED ORDER — METRONIDAZOLE IN NACL 5-0.79 MG/ML-% IV SOLN
INTRAVENOUS | Status: AC
  Filled 2011-03-26: qty 100

## 2011-03-26 MED ORDER — ALBUTEROL SULFATE HFA 108 (90 BASE) MCG/ACT IN AERS: 2.0000 | INHALATION_SPRAY | RESPIRATORY_TRACT | Status: DC | PRN

## 2011-03-26 MED ORDER — ONDANSETRON HCL 4 MG/2ML IJ SOLN
4.0000 mg | INTRAMUSCULAR | Status: DC | PRN
  Administered 2011-03-27 – 2011-03-28 (×3): 4 mg via INTRAVENOUS
  Filled 2011-03-26 (×5): qty 2

## 2011-03-26 MED ORDER — BUDESONIDE-FORMOTEROL FUMARATE 80-4.5 MCG/ACT IN AERO
2.0000 | INHALATION_SPRAY | Freq: Two times a day (BID) | RESPIRATORY_TRACT | Status: DC
  Administered 2011-03-26 – 2011-03-31 (×11): 2 via RESPIRATORY_TRACT
  Filled 2011-03-26: qty 6.9

## 2011-03-26 MED ORDER — PANTOPRAZOLE SODIUM 40 MG PO TBEC
40.0000 mg | DELAYED_RELEASE_TABLET | Freq: Every day | ORAL | Status: DC
  Administered 2011-03-27 – 2011-03-28 (×2): 40 mg via ORAL
  Filled 2011-03-26 (×2): qty 1

## 2011-03-26 MED ORDER — BISACODYL 10 MG RE SUPP: 10.0000 mg | Freq: Every day | RECTAL | Status: DC | PRN

## 2011-03-26 NOTE — Progress Notes (Signed)
Utilization review completed.  

## 2011-03-26 NOTE — H&P (Signed)
PCP:   Syliva Overman, MD, MD   Chief Complaint:  Persistent vomiting since today  HPI: Tracey Daniel is an 31 y.o. female.   Vomiting and abdominal pain since today; denies diarrhea; came to the emergency room for evaluation and management, and vomiting could not be controlled with Zofran in the emergency room. Eventually vomiting controlled with repeated doses of Phenergan, and had to be given Dilaudid for pain. As a result of this patient is extremely drowsy and unable to cooperate fully with the interview; unable to answer most questions. At the history is therefore taken by reviewing the chart.  No history of fever, sick contacts, no alcohol intake.  Rewiew of Systems:  The patient denies anorexia, fever, weight loss,, vision loss, decreased hearing, hoarseness,  syncope, dyspnea on exertion, peripheral edema, balance deficits, hemoptysis, melena, hematochezia, severe indigestion/heartburn, hematuria, incontinence, genital sores, muscle weakness, suspicious skin lesions, transient blindness, difficulty walking, unusual weight change, abnormal bleeding, enlarged lymph nodes, angioedema, and breast masses.    Past Medical History  Diagnosis Date  . Asthma   . Ulcer of abdomen wall     ???  . Migraines   . Osteoporosis   . Depression   . Seasonal allergies   . Sinusitis   . Back pain, chronic   . Nicotine addiction   . Constipation   . Vitamin d deficiency   . Gastritis   . Gastroparesis     Past Surgical History  Procedure Date  . Cholecystectomy     ?2002  . Laser surgery on cervix   . Carpal tunnel release     left hand  . Esophagogastroduodenoscopy 12/25/2010    Procedure: ESOPHAGOGASTRODUODENOSCOPY (EGD);  Surgeon: [name], MD;  moderate gastritis, ?goo secondary to pylorspasm. BX showed reactive gstropathy no h.pyori, SB mucosa with intramucosal lymphocytosis and partial villous blunting (TTG 4.0 normal)  . Esophageal dilation 12/25/2010    Procedure:  ESOPHAGEAL DILATION;  Surgeon: [name], MD;  Location: [facility];  Service: Endoscopy;;    Medications:  HOME MEDS: Prior to Admission medications   Medication Sig Start Date End Date Taking? Authorizing Provider  albuterol (PROVENTIL HFA;VENTOLIN HFA) 108 (90 BASE) MCG/ACT inhaler Inhale 2 puffs into the lungs every 6 (six) hours as needed. Shortness of breath  08/21/10 08/21/11 Yes Syliva Overman, MD  budesonide-formoterol [facility]) 80-4.5 MCG/ACT inhaler Inhale 2 puffs into the lungs 2 (two) times daily. 08/21/10 08/21/11 Yes Syliva Overman, MD  cyclobenzaprine (FLEXERIL) 10 MG tablet Take 10 mg by mouth 3 (three) times daily as needed. For muscle spasms   Yes Historical Provider, MD  DULoxetine (CYMBALTA) 30 MG capsule Take 1 capsule (30 mg total) by mouth daily. 08/21/10 08/21/11 Yes Syliva Overman, MD  estradiol (ESTRACE VAGINAL) 0.1 MG/GM vaginal cream Place 2 g vaginally daily. As directed   Yes Historical Provider, MD  estradiol-norethindrone (ACTIVELLA) 1-0.5 MG per tablet Take 1 tablet by mouth daily.   Yes Historical Provider, MD  feeding supplement (ENSURE CLINICAL STRENGTH) LIQD Take 237 mLs by mouth 3 (three) times daily with meals. 01/01/11  Yes Nishant Dhungel, MD  ferrous sulfate 325 (65 FE) MG tablet Take 325 mg by mouth daily with breakfast.   Yes Historical Provider, MD  HYDROcodone-acetaminophen (NORCO) 7.5-325 MG per tablet Take 1 tablet by mouth every 4 (four) hours as needed.    Yes Historical Provider, MD  pantoprazole (PROTONIX) 40 MG tablet Take 1 tablet (40 mg total) by mouth daily. 01/30/11 01/30/12 Yes Verlon Au  Lewis, PA  flintstones complete (FLINTSTONES) 60 MG chewable tablet Chew 1 tablet by mouth daily.    Historical Provider, MD     Allergies:  Allergies  Allergen Reactions  . Penicillins     unknown    Social History:   reports that she has quit smoking. Her smoking use included Cigarettes. She has a 15 pack-year smoking history. She does not have  any smokeless tobacco history on file. She reports that she does not drink alcohol or use illicit drugs.  Family History: Family History  Problem Relation Age of Onset  . Diabetes Father   . Liver disease Father     liver transplant at [facility], age 59  . Colon cancer Neg Hx   . Lung cancer Mother      Physical Exam: Filed Vitals:   03/25/11 1639 03/25/11 1928 03/25/11 2229 03/25/11 2306  BP: 107/66 150/122 119/64 120/63  Pulse: 92 89 62 63  Temp:   99.2 F (37.3 C) 98.6 F (37 C)  TempSrc:   Oral Oral  Resp: 24 20 18 20   Height: 5\' 6"  (1.676 m)   5\' 6"  (1.676 m)  Weight: 62.143 kg (137 lb)   61.4 kg (135 lb 5.8 oz)  SpO2: 98% 100% 100% 100%   Blood pressure 120/63, pulse 63, temperature 98.6 F (37 C), temperature source Oral, resp. rate 20, height 5\' 6"  (1.676 m), weight 61.4 kg (135 lb 5.8 oz), last menstrual period 01/19/1997, SpO2 100.00%.  GEN: Shows a lethargic young lady lying in the stretcher; unable to be cooperative with exam PSYCH:  alert and oriented x1;  HEENT: Mucous membranes pink, dry and anicteric; PERRLA; EOM intact; no cervical lymphadenopathy nor thyromegaly or carotid bruit; no JVD; Breasts:: Not examined CHEST WALL: No tenderness CHEST: Normal respiration, clear to auscultation bilaterally HEART: Regular rate and rhythm; no murmurs rubs or gallops BACK: No kyphosis or scoliosis; no CVA tenderness ABDOMEN: Obese, soft non-tender; no masses, no organomegaly, decreased abdominal bowel sounds; no pannus; no intertriginous candida. Rectal Exam: Not done EXTREMITIES: No bone or joint deformity;no edema; no ulcerations. Genitalia: not examined PULSES: 2+ and symmetric SKIN: Normal hydration no rash or ulceration CNS: Cranial nerves 2-12 grossly intact no focal lateralizing neurologic deficit   Labs & Imaging Results for orders placed during the hospital encounter of 03/25/11 (from the past 48 hour(s))  CBC     Status: Abnormal   Collection Time   03/25/11   4:53 PM      Component Value Range Comment   WBC 19.1 (*) 4.0 - 10.5 (K/uL)    RBC 4.39  3.87 - 5.11 (MIL/uL)    Hemoglobin 13.2  12.0 - 15.0 (g/dL)    HCT 40.9  81.1 - 91.4 (%)    MCV 88.6  78.0 - 100.0 (fL)    MCH 30.1  26.0 - 34.0 (pg)    MCHC 33.9  30.0 - 36.0 (g/dL)    RDW 78.2  95.6 - 21.3 (%)    Platelets 202  150 - 400 (K/uL)   DIFFERENTIAL     Status: Abnormal   Collection Time   03/25/11  4:53 PM      Component Value Range Comment   Neutrophils Relative 80 (*) 43 - 77 (%)    Neutro Abs 15.3 (*) 1.7 - 7.7 (K/uL)    Lymphocytes Relative 13  12 - 46 (%)    Lymphs Abs 2.5  0.7 - 4.0 (K/uL)    Monocytes Relative 7  3 - 12 (%)    Monocytes Absolute 1.3 (*) 0.1 - 1.0 (K/uL)    Eosinophils Relative 0  0 - 5 (%)    Eosinophils Absolute 0.0  0.0 - 0.7 (K/uL)    Basophils Relative 0  0 - 1 (%)    Basophils Absolute 0.0  0.0 - 0.1 (K/uL)   LIPASE, BLOOD     Status: Normal   Collection Time   03/25/11  4:53 PM      Component Value Range Comment   Lipase 24  11 - 59 (U/L)   COMPREHENSIVE METABOLIC PANEL     Status: Abnormal   Collection Time   03/25/11  4:53 PM      Component Value Range Comment   Sodium 135  135 - 145 (mEq/L)    Potassium 3.9  3.5 - 5.1 (mEq/L)    Chloride 94 (*) 96 - 112 (mEq/L)    CO2 27  19 - 32 (mEq/L)    Glucose, Bld 144 (*) 70 - 99 (mg/dL)    BUN 20  6 - 23 (mg/dL)    Creatinine, Ser 9.60  0.50 - 1.10 (mg/dL)    Calcium 45.4 (*) 8.4 - 10.5 (mg/dL)    Total Protein 8.7 (*) 6.0 - 8.3 (g/dL)    Albumin 5.2  3.5 - 5.2 (g/dL)    AST 25  0 - 37 (U/L)    ALT 18  0 - 35 (U/L)    Alkaline Phosphatase 68  39 - 117 (U/L)    Total Bilirubin 0.4  0.3 - 1.2 (mg/dL)    GFR calc non Af Amer 87 (*) >90 (mL/min)    GFR calc Af Amer >90  >90 (mL/min)    Ct Abdomen Pelvis W Contrast  03/25/2011  *RADIOLOGY REPORT*  Clinical Data: Abdominal pain and vomiting.  CT ABDOMEN AND PELVIS WITH CONTRAST  Technique:  Multidetector CT imaging of the abdomen and pelvis was performed  following the standard protocol during bolus administration of intravenous contrast.  Contrast: OMNIPAQUE IOHEXOL 300 MG/ML IJ SOLN  Comparison: CT 12/18/2010  Findings: Lung bases are clear.  Gallbladder has been removed. Liver and bile ducts are normal.  Pancreas spleen and kidneys are normal.  The colon is decompressed but appears to be thickened.  Some this could be due to collapse however I am concerned that could be colitis present.  If present, this is most likely mild.  No free fluid or abscess is present.  Negative for bowel obstruction. Appendix is normal.  IMPRESSION: Possible thickening of the colon diffusely suggestive of colitis. Otherwise negative.  Original Report Authenticated By: Camelia Phenes, M.D.   Dg Abd Acute W/chest  03/25/2011  *RADIOLOGY REPORT*  Clinical Data: Abdominal pain and vomiting  ACUTE ABDOMEN SERIES (ABDOMEN 2 VIEW & CHEST 1 VIEW)  Comparison: 12/26/2010  Findings: Heart size is normal and the vascularity is normal. Lungs are clear.  Normal bowel gas pattern.  Negative for free intraperitoneal gas. Surgical clips in the gallbladder fossa.  No renal calculi.  No bony abnormality.  IMPRESSION: Negative  Original Report Authenticated By: Camelia Phenes, M.D.      Assessment Present on Admission:  .Intractable vomiting .Abdominal pain .Vomiting Leukocytosis, questionable due to infection questionable due to dehydration  .Colitis - presumed infectious origin .DEPRESSION .GERD (gastroesophageal reflux disease)  PLAN: We'll admit this lady for management of her intractable vomiting, will use Phenergan and Reglan, since she does have a history of pyloric obstruction; and  also because of this we may have to consider a GI consult at a later date if she's not resolving sufficiently rapidly.  We'll hydrate and keep her nil by mouth at the time being; We'll minimize the use of narcotics.  CT scan does show a mild right colitis, and she does have a leukocytosis;  in case this is the cause of her vomiting, will give intravenous antibiotics for infectious colitis, and review her white count after vigorous hydration  Other plans as per orders  Other plans as per orders.    Angelise Petrich 03/26/2011, 12:16 AM

## 2011-03-26 NOTE — Progress Notes (Signed)
Subjective: Nausea improved.  No pain. No diarrhea. Hungry and thirsty.  Objective: Vital signs in last 24 hours: Filed Vitals:   03/26/11 0500 03/26/11 0552 03/26/11 1059 03/26/11 1350  BP:  103/61  117/70  Pulse:  88  84  Temp:  98.4 F (36.9 C)  98.8 F (37.1 C)  TempSrc:  Oral    Resp:  20  18  Height:      Weight: 61.5 kg (135 lb 9.3 oz)     SpO2:  100% 99% 100%   Weight change:   Intake/Output Summary (Last 24 hours) at 03/26/11 1420 Last data filed at 03/26/11 0600  Gross per 24 hour  Intake 2022.92 ml  Output    700 ml  Net 1322.92 ml   Physical Exam:  Lungs clear to auscultation bilaterally without wheezes rhonchi or rales Cardiovascular regular rate rhythm without murmurs gallops rubs Abdomen soft normal bowel sounds nontender nondistended Extremities no clubbing cyanosis or edema  Lab Results: Basic Metabolic Panel:  Lab 03/26/11 1308 03/25/11 1653  NA 135 135  K 3.5 3.9  CL 98 94*  CO2 25 27  GLUCOSE 135* 144*  BUN 16 20  CREATININE 0.68 0.87  CALCIUM 9.9 11.4*  MG 1.6 --  PHOS -- --   Liver Function Tests:  Lab 03/26/11 0516 03/25/11 1653  AST 22 25  ALT 16 18  ALKPHOS 59 68  BILITOT 0.3 0.4  PROT 7.6 8.7*  ALBUMIN 4.4 5.2    Lab 03/25/11 1653  LIPASE 24  AMYLASE --   No results found for this basename: AMMONIA:2 in the last 168 hours CBC:  Lab 03/26/11 0516 03/25/11 1653  WBC 12.5* 19.1*  NEUTROABS -- 15.3*  HGB 11.2* 13.2  HCT 33.3* 38.9  MCV 87.9 88.6  PLT 178 202   Cardiac Enzymes: No results found for this basename: CKTOTAL:3,CKMB:3,CKMBINDEX:3,TROPONINI:3 in the last 168 hours BNP: No results found for this basename: PROBNP:3 in the last 168 hours D-Dimer: No results found for this basename: DDIMER:2 in the last 168 hours CBG: No results found for this basename: GLUCAP:6 in the last 168 hours Hemoglobin A1C: No results found for this basename: HGBA1C in the last 168 hours Fasting Lipid Panel: No results found for  this basename: CHOL,HDL,LDLCALC,TRIG,CHOLHDL,LDLDIRECT in the last 657 hours Thyroid Function Tests: No results found for this basename: TSH,T4TOTAL,FREET4,T3FREE,THYROIDAB in the last 168 hours Coagulation: No results found for this basename: LABPROT:4,INR:4 in the last 168 hours Anemia Panel: No results found for this basename: VITAMINB12,FOLATE,FERRITIN,TIBC,IRON,RETICCTPCT in the last 168 hours Urine Drug Screen: Drugs of Abuse  No results found for this basename: labopia, cocainscrnur, labbenz, amphetmu, thcu, labbarb    Alcohol Level: No results found for this basename: ETH:2 in the last 168 hours Urinalysis:  Lab 03/26/11 1045  COLORURINE YELLOW  LABSPEC 1.030  PHURINE 6.0  GLUCOSEU NEGATIVE  HGBUR NEGATIVE  BILIRUBINUR NEGATIVE  KETONESUR >80*  PROTEINUR NEGATIVE  UROBILINOGEN 0.2  NITRITE NEGATIVE  LEUKOCYTESUR NEGATIVE   Micro Results: No results found for this or any previous visit (from the past 240 hour(s)). Studies/Results: Ct Abdomen Pelvis W Contrast  03/25/2011  *RADIOLOGY REPORT*  Clinical Data: Abdominal pain and vomiting.  CT ABDOMEN AND PELVIS WITH CONTRAST  Technique:  Multidetector CT imaging of the abdomen and pelvis was performed following the standard protocol during bolus administration of intravenous contrast.  Contrast: OMNIPAQUE IOHEXOL 300 MG/ML IJ SOLN  Comparison: CT 12/18/2010  Findings: Lung bases are clear.  Gallbladder has been  removed. Liver and bile ducts are normal.  Pancreas spleen and kidneys are normal.  The colon is decompressed but appears to be thickened.  Some this could be due to collapse however I am concerned that could be colitis present.  If present, this is most likely mild.  No free fluid or abscess is present.  Negative for bowel obstruction. Appendix is normal.  IMPRESSION: Possible thickening of the colon diffusely suggestive of colitis. Otherwise negative.  Original Report Authenticated By: Camelia Phenes, M.D.   Dg Abd  Acute W/chest  03/25/2011  *RADIOLOGY REPORT*  Clinical Data: Abdominal pain and vomiting  ACUTE ABDOMEN SERIES (ABDOMEN 2 VIEW & CHEST 1 VIEW)  Comparison: 12/26/2010  Findings: Heart size is normal and the vascularity is normal. Lungs are clear.  Normal bowel gas pattern.  Negative for free intraperitoneal gas. Surgical clips in the gallbladder fossa.  No renal calculi.  No bony abnormality.  IMPRESSION: Negative  Original Report Authenticated By: Camelia Phenes, M.D.   Scheduled Meds:   . sodium chloride   Intravenous STAT  . budesonide-formoterol  2 puff Inhalation BID  . ciprofloxacin  400 mg Intravenous Once  . ciprofloxacin  400 mg Intravenous Q12H  . DULoxetine  30 mg Oral Daily  . enoxaparin  40 mg Subcutaneous Daily  .  HYDROmorphone (DILAUDID) injection  1 mg Intravenous Once  . metoCLOPramide  10 mg Intravenous Once  . metronidazole  500 mg Intravenous Once  . metronidazole  500 mg Intravenous Q8H  . ondansetron  4 mg Intravenous Once  . pantoprazole  40 mg Oral Daily  . pantoprazole (PROTONIX) IV  40 mg Intravenous Once  . promethazine  25 mg Intravenous Once  . sodium chloride  1,000 mL Intravenous Once  . sodium chloride  1,000 mL Intravenous Once  . sodium chloride      . DISCONTD: metoCLOPramide (REGLAN) injection  10 mg Intravenous Q6H  . DISCONTD: pantoprazole (PROTONIX) IV  40 mg Intravenous Q12H  . DISCONTD: promethazine  12.5 mg Intravenous Once   Continuous Infusions:   . 0.9 % NaCl with KCl 20 mEq / L 125 mL/hr at 03/26/11 0223   PRN Meds:.acetaminophen, albuterol, bisacodyl, iohexol, ondansetron (ZOFRAN) IV, oxyCODONE, promethazine, DISCONTD: acetaminophen, DISCONTD:  HYDROmorphone (DILAUDID) injection, DISCONTD: ondansetron (ZOFRAN) IV Assessment/Plan:  Vomiting has resolved. Will try liquids this afternoon and then if tolerated a solid dinner. Continue IV antibiotics for possible colitis for now. Monitor another 24 hours. If tolerating a diet can probably go  home tomorrow. Resume Celexa and Protonix by mouth. Decrease IV fluids to 50 cc an hour.    LOS: 1 day   Cheyann Blecha L 03/26/2011, 2:20 PM

## 2011-03-27 LAB — CARDIAC PANEL(CRET KIN+CKTOT+MB+TROPI): CK, MB: 2.4 ng/mL (ref 0.3–4.0)

## 2011-03-27 MED ORDER — ONDANSETRON HCL 4 MG/2ML IJ SOLN
2.0000 mg | Freq: Three times a day (TID) | INTRAMUSCULAR | Status: DC
  Administered 2011-03-27 – 2011-03-29 (×6): 2 mg via INTRAVENOUS
  Filled 2011-03-27 (×5): qty 2

## 2011-03-27 MED ORDER — POTASSIUM CHLORIDE IN NACL 40-0.9 MEQ/L-% IV SOLN
INTRAVENOUS | Status: DC
  Administered 2011-03-27 – 2011-03-29 (×3): via INTRAVENOUS
  Filled 2011-03-27 (×7): qty 1000

## 2011-03-27 NOTE — Progress Notes (Signed)
Subjective: The patient says that her nausea has returned. One small episode of vomiting after eating a this morning. No significant abdominal pain.  Objective: Vital signs in last 24 hours: Filed Vitals:   03/26/11 2003 03/26/11 2136 03/27/11 0530 03/27/11 0737  BP:  118/74 162/82   Pulse:  95 62   Temp:  99 F (37.2 C) 98.3 F (36.8 C)   TempSrc:  Oral Oral   Resp:  18 18   Height:      Weight:      SpO2: 100% 96% 100% 100%    Intake/Output Summary (Last 24 hours) at 03/27/11 1448 Last data filed at 03/27/11 0903  Gross per 24 hour  Intake 1982.5 ml  Output    300 ml  Net 1682.5 ml    Weight change:   Physical exam: Lungs: Clear to auscultation bilaterally. Heart: S1, S2, with no murmurs rubs or gallops. Abdomen: Positive bowel sounds, soft, mildly tender over the hypogastrium, no distention, no rigidity or guarding. Extremities: No pedal edema. Neurologic/psychological: She is alert and oriented x3. She has a sad/flat affect.  Lab Results: Basic Metabolic Panel:  Basename 03/26/11 0516 03/25/11 1653  NA 135 135  K 3.5 3.9  CL 98 94*  CO2 25 27  GLUCOSE 135* 144*  BUN 16 20  CREATININE 0.68 0.87  CALCIUM 9.9 11.4*  MG 1.6 --  PHOS -- --   Liver Function Tests:  [facility] 03/26/11 0516 03/25/11 1653  AST 22 25  ALT 16 18  ALKPHOS 59 68  BILITOT 0.3 0.4  PROT 7.6 8.7*  ALBUMIN 4.4 5.2    Basename 03/25/11 1653  LIPASE 24  AMYLASE --   No results found for this basename: AMMONIA:2 in the last 72 hours CBC:  Basename 03/26/11 0516 03/25/11 1653  WBC 12.5* 19.1*  NEUTROABS -- 15.3*  HGB 11.2* 13.2  HCT 33.3* 38.9  MCV 87.9 88.6  PLT 178 202   Cardiac Enzymes:  Basename 03/27/11 0518  CKTOTAL 293*  CKMB 2.4  CKMBINDEX --  TROPONINI <0.30   BNP: No results found for this basename: PROBNP:3 in the last 72 hours D-Dimer: No results found for this basename: DDIMER:2 in the last 72 hours CBG: No results found for this basename: GLUCAP:6  in the last 72 hours Hemoglobin A1C: No results found for this basename: HGBA1C in the last 72 hours Fasting Lipid Panel: No results found for this basename: CHOL,HDL,LDLCALC,TRIG,CHOLHDL,LDLDIRECT in the last 72 hours Thyroid Function Tests:  Basename 03/26/11 0158  TSH 0.732  T4TOTAL --  FREET4 --  T3FREE --  THYROIDAB --   Anemia Panel: No results found for this basename: VITAMINB12,FOLATE,FERRITIN,TIBC,IRON,RETICCTPCT in the last 72 hours Coagulation: No results found for this basename: LABPROT:2,INR:2 in the last 72 hours Urine Drug Screen: Drugs of Abuse  No results found for this basename: labopia, cocainscrnur, labbenz, amphetmu, thcu, labbarb    Alcohol Level: No results found for this basename: ETH:2 in the last 72 hours Urinalysis:  Basename 03/26/11 1045  COLORURINE YELLOW  LABSPEC 1.030  PHURINE 6.0  GLUCOSEU NEGATIVE  HGBUR NEGATIVE  BILIRUBINUR NEGATIVE  KETONESUR >80*  PROTEINUR NEGATIVE  UROBILINOGEN 0.2  NITRITE NEGATIVE  LEUKOCYTESUR NEGATIVE   Misc. Labs:   Micro: No results found for this or any previous visit (from the past 240 hour(s)).  Studies/Results: Ct Abdomen Pelvis W Contrast  03/25/2011  *RADIOLOGY REPORT*  Clinical Data: Abdominal pain and vomiting.  CT ABDOMEN AND PELVIS WITH CONTRAST  Technique:  Multidetector  CT imaging of the abdomen and pelvis was performed following the standard protocol during bolus administration of intravenous contrast.  Contrast: OMNIPAQUE IOHEXOL 300 MG/ML IJ SOLN  Comparison: CT 12/18/2010  Findings: Lung bases are clear.  Gallbladder has been removed. Liver and bile ducts are normal.  Pancreas spleen and kidneys are normal.  The colon is decompressed but appears to be thickened.  Some this could be due to collapse however I am concerned that could be colitis present.  If present, this is most likely mild.  No free fluid or abscess is present.  Negative for bowel obstruction. Appendix is normal.   IMPRESSION: Possible thickening of the colon diffusely suggestive of colitis. Otherwise negative.  Original Report Authenticated By: Camelia Phenes, M.D.   Dg Abd Acute W/chest  03/25/2011  *RADIOLOGY REPORT*  Clinical Data: Abdominal pain and vomiting  ACUTE ABDOMEN SERIES (ABDOMEN 2 VIEW & CHEST 1 VIEW)  Comparison: 12/26/2010  Findings: Heart size is normal and the vascularity is normal. Lungs are clear.  Normal bowel gas pattern.  Negative for free intraperitoneal gas. Surgical clips in the gallbladder fossa.  No renal calculi.  No bony abnormality.  IMPRESSION: Negative  Original Report Authenticated By: Camelia Phenes, M.D.    Medications: I have reviewed the patient's current medications.  Assessment: Principal Problem:  *Colitis - presumed infectious origin Active Problems:  DEPRESSION  GERD (gastroesophageal reflux disease)  Intractable vomiting  Abdominal pain  Vomiting   1. Colitis. She is on IV Cipro and Flagyl.  2. Nausea and vomiting. Both had resolved yesterday. Her diet was advanced. She had one episode of nausea and small emesis this morning. No appreciable abnormal pain. Next  3. Depression. Celexa was restarted.  Plan:   1. Will maintain diet for now. If she worsens, downgrade diet and consider GI consult. Otherwise, continue supportive treatment, antiemetics, and antibiotics. We'll add scheduled Zofran IV.   LOS: 2 days   Lucius Wise 03/27/2011, 2:48 PM

## 2011-03-28 LAB — CBC
HCT: 33.4 % — ABNORMAL LOW (ref 36.0–46.0)
Hemoglobin: 11.3 g/dL — ABNORMAL LOW (ref 12.0–15.0)
WBC: 9.9 10*3/uL (ref 4.0–10.5)

## 2011-03-28 LAB — BASIC METABOLIC PANEL
BUN: 11 mg/dL (ref 6–23)
BUN: 10 mg/dL (ref 6–23)
Chloride: 99 mEq/L (ref 96–112)
Chloride: 99 mEq/L (ref 96–112)
GFR calc Af Amer: >90 mL/min (ref >90)
GFR calc Af Amer: >90 mL/min (ref >90)
Glucose, Bld: 95 mg/dL (ref 70–99)
Potassium: 3.3 mEq/L — ABNORMAL LOW (ref 3.5–5.1)
Potassium: 3.4 mEq/L — ABNORMAL LOW (ref 3.5–5.1)
Sodium: 135 mEq/L (ref 135–145)
Sodium: 136 mEq/L (ref 135–145)

## 2011-03-28 MED ORDER — PANTOPRAZOLE SODIUM 40 MG IV SOLR: 40.0000 mg | INTRAVENOUS | Status: DC

## 2011-03-28 MED ORDER — POTASSIUM CHLORIDE CRYS ER 20 MEQ PO TBCR: 20.0000 meq | EXTENDED_RELEASE_TABLET | Freq: Two times a day (BID) | ORAL | Status: DC

## 2011-03-28 MED ORDER — METOCLOPRAMIDE HCL 5 MG/ML IJ SOLN
10.0000 mg | Freq: Four times a day (QID) | INTRAMUSCULAR | Status: DC
  Administered 2011-03-28 – 2011-03-29 (×4): 10 mg via INTRAVENOUS
  Filled 2011-03-28 (×4): qty 2

## 2011-03-28 MED ORDER — OXYCODONE HCL 5 MG PO TABS
5.0000 mg | ORAL_TABLET | ORAL | Status: DC | PRN
  Administered 2011-03-28 – 2011-03-29 (×2): 5 mg via ORAL
  Filled 2011-03-28 (×2): qty 1

## 2011-03-28 MED ORDER — SUCRALFATE 1 GM/10ML PO SUSP
1.0000 g | Freq: Three times a day (TID) | ORAL | Status: DC
  Administered 2011-03-28 – 2011-03-29 (×3): 1 g via ORAL
  Filled 2011-03-28 (×3): qty 10

## 2011-03-28 NOTE — Progress Notes (Signed)
Subjective: The patient continues to have nausea and vomiting. In fact she is vomiting now. She acknowledges that her pain has subsided.  Objective: Vital signs in last 24 hours: Filed Vitals:   03/27/11 2021 03/28/11 0601 03/28/11 0750 03/28/11 1427  BP: 126/70 138/90  132/70  Pulse: 68 71  60  Temp: 99 F (37.2 C) 98.5 F (36.9 C)  98.4 F (36.9 C)  TempSrc: Oral Oral  Oral  Resp: 18 18  18   Height:      Weight:      SpO2: 100% 100% 100% 99%    Intake/Output Summary (Last 24 hours) at 03/28/11 1544 Last data filed at 03/28/11 1304  Gross per 24 hour  Intake   2297 ml  Output    400 ml  Net   1897 ml    Weight change:   Physical exam: General: She is actively vomiting. No signs of hematemesis. Lungs: Clear to auscultation bilaterally. Heart: S1, S2, with no murmurs rubs or gallops. Abdomen: Positive bowel sounds, soft, mildly tender over the hypogastrium, no distention, no rigidity or guarding. Extremities: No pedal edema. Neurologic/psychological: She is alert and oriented x3. She has a sad/flat affect.  Lab Results: Basic Metabolic Panel:  Basename 03/28/11 0614 03/26/11 0516  NA 135 135  K 3.3* 3.5  CL 99 98  CO2 25 25  GLUCOSE 95 135*  BUN 11 16  CREATININE 0.75 0.68  CALCIUM 9.8 9.9  MG -- 1.6  PHOS -- --   Liver Function Tests:  [facility] 03/26/11 0516 03/25/11 1653  AST 22 25  ALT 16 18  ALKPHOS 59 68  BILITOT 0.3 0.4  PROT 7.6 8.7*  ALBUMIN 4.4 5.2    Basename 03/28/11 0614 03/25/11 1653  LIPASE 19 24  AMYLASE -- --   No results found for this basename: AMMONIA:2 in the last 72 hours CBC:  Basename 03/28/11 0614 03/26/11 0516 03/25/11 1653  WBC 9.9 12.5* --  NEUTROABS -- -- 15.3*  HGB 11.3* 11.2* --  HCT 33.4* 33.3* --  MCV 88.4 87.9 --  PLT 155 178 --   Cardiac Enzymes:  Basename 03/27/11 0518  CKTOTAL 293*  CKMB 2.4  CKMBINDEX --  TROPONINI <0.30   BNP: No results found for this basename: PROBNP:3 in the last 72  hours D-Dimer: No results found for this basename: DDIMER:2 in the last 72 hours CBG: No results found for this basename: GLUCAP:6 in the last 72 hours Hemoglobin A1C: No results found for this basename: HGBA1C in the last 72 hours Fasting Lipid Panel: No results found for this basename: CHOL,HDL,LDLCALC,TRIG,CHOLHDL,LDLDIRECT in the last 72 hours Thyroid Function Tests:  Basename 03/26/11 0158  TSH 0.732  T4TOTAL --  FREET4 --  T3FREE --  THYROIDAB --   Anemia Panel: No results found for this basename: VITAMINB12,FOLATE,FERRITIN,TIBC,IRON,RETICCTPCT in the last 72 hours Coagulation: No results found for this basename: LABPROT:2,INR:2 in the last 72 hours Urine Drug Screen: Drugs of Abuse  No results found for this basename: labopia,  cocainscrnur,  labbenz,  amphetmu,  thcu,  labbarb    Alcohol Level: No results found for this basename: ETH:2 in the last 72 hours Urinalysis:  Basename 03/26/11 1045  COLORURINE YELLOW  LABSPEC 1.030  PHURINE 6.0  GLUCOSEU NEGATIVE  HGBUR NEGATIVE  BILIRUBINUR NEGATIVE  KETONESUR >80*  PROTEINUR NEGATIVE  UROBILINOGEN 0.2  NITRITE NEGATIVE  LEUKOCYTESUR NEGATIVE   Misc. Labs:   Micro: No results found for this or any previous visit (from the past  240 hour(s)).  Studies/Results: No results found.  Medications: I have reviewed the patient's current medications.  Assessment: Principal Problem:  *Colitis - presumed infectious origin Active Problems:  DEPRESSION  GERD (gastroesophageal reflux disease)  Intractable vomiting  Abdominal pain  Vomiting   1. Colitis. She is on IV Cipro and Flagyl, but will discontinue Flagyl because of ongoing nausea and vomiting in the setting of improved abdominal symptomatology.  2. Nausea and vomiting. She has no significant pain. I believe her nausea and vomiting is the result of IV Flagyl. It will be discontinued. We'll continue scheduled Zofran.  3. Depression. Stable.  4.  Hypokalemia. Her blood magnesium level is 1.6.  Plan:  1. We'll discontinue Flagyl. We'll continue Cipro. 2. Will supplement potassium in the IV fluids. 3. Will hold oral medications for now. Will transition Protonix IV. 4. When her nausea and vomiting have subsided, will restart Celexa. 5. We'll downgrade her diet to a clear liquid diet.   LOS: 3 days   Jalyn Dutta 03/28/2011, 3:44 PM

## 2011-03-29 ENCOUNTER — Inpatient Hospital Stay (HOSPITAL_COMMUNITY): Payer: Medicare Other

## 2011-03-29 LAB — COMPREHENSIVE METABOLIC PANEL
ALT: 16 U/L (ref 0–35)
AST: 18 U/L (ref 0–37)
Albumin: 4.2 g/dL (ref 3.5–5.2)
Alkaline Phosphatase: 54 U/L (ref 39–117)
CO2: 28 mEq/L (ref 19–32)
Chloride: 100 mEq/L (ref 96–112)
Creatinine, Ser: 0.87 mg/dL (ref 0.50–1.10)
GFR calc non Af Amer: 87 mL/min — ABNORMAL LOW (ref >90)
Potassium: 3.6 mEq/L (ref 3.5–5.1)
Total Bilirubin: 0.5 mg/dL (ref 0.3–1.2)

## 2011-03-29 MED ORDER — IOHEXOL 300 MG/ML  SOLN
100.0000 mL | Freq: Once | INTRAMUSCULAR | Status: AC | PRN
  Administered 2011-03-29: 100 mL via INTRAVENOUS

## 2011-03-29 MED ORDER — IOHEXOL 300 MG/ML  SOLN
40.0000 mL | Freq: Once | INTRAMUSCULAR | Status: AC | PRN
  Administered 2011-03-29: 40 mL via ORAL

## 2011-03-29 MED ORDER — MORPHINE SULFATE 4 MG/ML IJ SOLN
4.0000 mg | INTRAMUSCULAR | Status: DC | PRN
  Administered 2011-03-30 – 2011-03-31 (×4): 4 mg via INTRAVENOUS
  Filled 2011-03-29 (×4): qty 1

## 2011-03-29 MED ORDER — ONDANSETRON HCL 4 MG/2ML IJ SOLN
4.0000 mg | Freq: Three times a day (TID) | INTRAMUSCULAR | Status: DC
  Administered 2011-03-29 – 2011-03-31 (×5): 4 mg via INTRAVENOUS
  Filled 2011-03-29 (×4): qty 2

## 2011-03-29 MED ORDER — BOOST / RESOURCE BREEZE PO LIQD
1.0000 | Freq: Three times a day (TID) | ORAL | Status: DC
  Administered 2011-03-29: 1 via ORAL
  Filled 2011-03-29 (×9): qty 1

## 2011-03-29 MED ORDER — PANTOPRAZOLE SODIUM 40 MG IV SOLR
40.0000 mg | Freq: Two times a day (BID) | INTRAVENOUS | Status: DC
  Administered 2011-03-30 – 2011-03-31 (×3): 40 mg via INTRAVENOUS
  Filled 2011-03-29 (×3): qty 40

## 2011-03-29 MED ORDER — KCL IN DEXTROSE-NACL 40-5-0.9 MEQ/L-%-% IV SOLN
INTRAVENOUS | Status: DC
  Administered 2011-03-29 – 2011-03-31 (×4): via INTRAVENOUS
  Filled 2011-03-29 (×5): qty 1000

## 2011-03-29 NOTE — Consult Note (Addendum)
Referring Provider: No ref. provider found Primary Care Physician:  Syliva Overman, MD, MD Primary Gastroenterologist:  DR. Jena Gauss  Reason for Consultation:  NAUSEA, VOMITING, ABDOMINAL PAIN  HPI:  PT KNOWN TO OUR SERVICE. First seen and evaluated for NVD, abd pain, and weight loss. W/U REVEALED no etiology for diarrhea, but NVAbdPain due to pylorospasm. LAST EGD AT [facility] DEC 2012-GASTRITIS. Pt seen and evaluated at [facility] and had Botox of her pylorus. Clinically improved. LAST SEEN IN OFFICE JAN 2013-138 LBS. Marland KitcheniFOBT NEG FEB 2013.   In her usual state of health until WED when she developed an acute onset of NVD & UPPER abdominal pain. She was having 5-10 BMs daily. Saw sml amount of blood on her emesis. NO BLOOD IN HER STOOL. CT A/P MAR 6 SHOWED ? COLITIS. PT ADMITTED & PLACED ON CIPRO.  VOMITED 5 TIMES YESTERDAY. LAST VOMITED THIS AM. DIARRHEA IMPROVED, BUT STILL HAVING INTERMITTENT, SEVERE UPPER ABDOMINAL PAIN-NL HFP/LIPASE, CT PENDING.  Past Medical History  Diagnosis Date  . Asthma   . Ulcer of abdomen wall     ???  . Migraines   . Osteoporosis   . Depression   . Seasonal allergies   . Sinusitis   . Back pain, chronic   . Nicotine addiction   . Constipation   . Vitamin d deficiency   . Gastritis   . Gastroparesis     Past Surgical History  Procedure Date  . Cholecystectomy     ?2002  . Laser surgery on cervix   . Carpal tunnel release     left hand  . Esophagogastroduodenoscopy 12/25/2010    Procedure: ESOPHAGOGASTRODUODENOSCOPY (EGD);  Surgeon: [name], MD;  moderate gastritis, ?goo secondary to pylorspasm. BX showed reactive gstropathy no h.pyori, SB mucosa with intramucosal lymphocytosis and partial villous blunting (TTG 4.0 normal)  . Esophageal dilation 12/25/2010    Procedure: ESOPHAGEAL DILATION;  Surgeon: [name], MD;  Location: [facility];  Service: Endoscopy;;    Prior to Admission medications   Medication Sig Start Date End Date Taking?  Authorizing Provider  albuterol (PROVENTIL HFA;VENTOLIN HFA) 108 (90 BASE) MCG/ACT inhaler Inhale 2 puffs into the lungs every 6 (six) hours as needed. Shortness of breath  08/21/10 08/21/11 Yes Kerri Perches, MD  budesonide-formoterol [facility]) 80-4.5 MCG/ACT inhaler Inhale 2 puffs into the lungs 2 (two) times daily. 08/21/10 08/21/11 Yes Kerri Perches, MD  cyclobenzaprine (FLEXERIL) 10 MG tablet Take 10 mg by mouth 3 (three) times daily as needed. For muscle spasms   Yes Historical Provider, MD  DULoxetine (CYMBALTA) 30 MG capsule Take 1 capsule (30 mg total) by mouth daily. 08/21/10 08/21/11 Yes Kerri Perches, MD  estradiol (ESTRACE VAGINAL) 0.1 MG/GM vaginal cream Place 2 g vaginally daily. As directed   Yes Historical Provider, MD  estradiol-norethindrone (ACTIVELLA) 1-0.5 MG per tablet Take 1 tablet by mouth daily.   Yes Historical Provider, MD  feeding supplement (ENSURE CLINICAL STRENGTH) LIQD Take 237 mLs by mouth 3 (three) times daily with meals. 01/01/11  Yes Nishant Dhungel, MD  ferrous sulfate 325 (65 FE) MG tablet Take 325 mg by mouth daily with breakfast.   Yes Historical Provider, MD  HYDROcodone-acetaminophen (NORCO) 7.5-325 MG per tablet Take 1 tablet by mouth every 4 (four) hours as needed.    Yes Historical Provider, MD  pantoprazole (PROTONIX) 40 MG tablet Take 1 tablet (40 mg total) by mouth daily. 01/30/11 01/30/12 Yes Tiffany Kocher, PA  flintstones complete (FLINTSTONES) 60 MG  chewable tablet Chew 1 tablet by mouth daily.    Historical Provider, MD    Current Facility-Administered Medications  Medication Dose Route Frequency Provider Last Rate Last Dose  . acetaminophen (TYLENOL) tablet 650 mg  650 mg Oral Q6H PRN Vania Rea, MD      . albuterol (PROVENTIL HFA;VENTOLIN HFA) 108 (90 BASE) MCG/ACT inhaler 2 puff  2 puff Inhalation Q4H PRN Vania Rea, MD      . bisacodyl (DULCOLAX) suppository 10 mg  10 mg Rectal Daily PRN Vania Rea, MD      .  budesonide-formoterol [facility]) 80-4.5 MCG/ACT inhaler 2 puff  2 puff Inhalation BID Vania Rea, MD   2 puff at 03/29/11 0813  . ciprofloxacin (CIPRO) IVPB 400 mg  400 mg Intravenous Q12H Vania Rea, MD   400 mg at 03/29/11 1034  . dextrose 5 % and 0.9 % NaCl with KCl 40 mEq/L infusion   Intravenous Continuous Elliot Cousin, MD 75 mL/hr at 03/29/11 1206    . enoxaparin (LOVENOX) injection 40 mg  40 mg Subcutaneous Daily Vania Rea, MD   40 mg at 03/29/11 1026  . metoCLOPramide (REGLAN) injection 10 mg  10 mg Intravenous Q6H Vania Rea, MD   10 mg at 03/29/11 1206  . morphine 4 MG/ML injection 4 mg  4 mg Intravenous Q4H PRN Elliot Cousin, MD      . ondansetron [facility]) injection 4 mg  4 mg Intravenous Q4H PRN Vania Rea, MD   4 mg at 03/28/11 0853  . pantoprazole (PROTONIX) injection 40 mg  40 mg Intravenous Q24H Elliot Cousin, MD      . promethazine (PHENERGAN) injection 12.5 mg  12.5 mg Intravenous Q4H PRN Vania Rea, MD   12.5 mg at 03/28/11 1544  . sucralfate (CARAFATE) 1 GM/10ML suspension 1 g  1 g Oral TID WC & HS Vania Rea, MD   1 g at 03/29/11 1205  . DISCONTD: 0.9 % NaCl with KCl 40 mEq / L  infusion   Intravenous Continuous Elliot Cousin, MD 100 mL/hr at 03/29/11 0001    . DISCONTD: DULoxetine (CYMBALTA) DR capsule 30 mg  30 mg Oral Daily Christiane Ha, MD   30 mg at 03/28/11 1012  . DISCONTD: metroNIDAZOLE (FLAGYL) IVPB 500 mg  500 mg Intravenous Q8H Vania Rea, MD   500 mg at 03/28/11 1242  . DISCONTD: ondansetron (ZOFRAN) injection 2 mg  2 mg Intravenous Q8H Elliot Cousin, MD   2 mg at 03/29/11 1610  . DISCONTD: oxyCODONE (Oxy IR/ROXICODONE) immediate release tablet 5 mg  5 mg Oral Q4H PRN Vania Rea, MD   5 mg at 03/28/11 1241  . DISCONTD: oxyCODONE (Oxy IR/ROXICODONE) immediate release tablet 5 mg  5 mg Oral Q3H PRN Vania Rea, MD   5 mg at 03/29/11 1026  . DISCONTD: pantoprazole (PROTONIX) EC tablet 40 mg  40 mg Oral  Daily Christiane Ha, MD   40 mg at 03/28/11 1012  . DISCONTD: potassium chloride SA (K-DUR,KLOR-CON) CR tablet 20 mEq  20 mEq Oral BID Elliot Cousin, MD        Allergies as of 03/25/2011 - Review Complete 03/25/2011  Allergen Reaction Noted  . Penicillins      Family History:  Colon Cancer  negative                           Polyps  Negative    LIVER DISEASE(FATHER)   History   Social  History  . Marital Status: Single    Spouse Name: N/A    Number of Children: 0  . Years of Education: N/A   Occupational History  . disability    Social History Main Topics  . Smoking status: Former Smoker -- 1.0 packs/day for 15 years    Types: Cigarettes  . Smokeless tobacco: Not on file   Comment: quit 2011  . Alcohol Use: No     none since July. Periodic heavy drinker for months at a time.  . Drug Use: No  . Sexually Active: Yes    Birth Control/ Protection: None   Review of Systems: PER HPI OTHERWISE ALL SYSTEMS NEGATIVE  Vitals: Blood pressure 119/77, pulse 68, temperature 98.3 F (36.8 C), temperature source Oral, resp. rate 18, height 5\' 6"  (1.676 m), weight 135 lb 9.3 oz (61.5 kg), last menstrual period 01/19/1997, SpO2 100.00%.  Physical Exam: General:   Alert,  pleasant and cooperative in NAD Head:  Normocephalic and atraumatic. Eyes:  Sclera clear, no icterus.   Conjunctiva pink. Mouth:  NO LESIONS Neck:  Supple;  Lungs:  Clear throughout to auscultation.   No wheezes. No acute distress. Heart:  Regular rate and rhythm; no murmurs. Abdomen:  THIN, Soft, mild ttp in the epigastrium, nondistended. No masses, hepatosplenomegaly or hernias noted. Normal bowel sounds, without guarding, and without rebound.   Msk:  Symmetrical without gross deformities.  Extremities:  Without edema. Neurologic:  Alert and  oriented x4;  grossly normal neurologically. Cervical Nodes:  No significant cervical adenopathy. Psych:  Alert and cooperative. Normal mood and affect.   Lab  Results:  [facility] 03/28/11 0614  WBC 9.9  HGB 11.3*  HCT 33.4*  PLT 155   BMET  Basename 03/29/11 1149 03/28/11 1659  NA 136 136  K 3.6 3.4*  CL 100 99  CO2 28 27  GLUCOSE 123* 95  BUN 8 10  CREATININE 0.87 0.87  CALCIUM 9.9 9.8   LFT  Basename 03/29/11 1149  PROT 7.3  ALBUMIN 4.2  AST 18  ALT 16  ALKPHOS 54  BILITOT 0.5  BILIDIR --  IBILI --     Studies/Results: CT MAR 10 PENDING  Impression: 31 YO FEMALE WITH KNOWN WJ:[id number], CLINICALLY IMPROVED AFTER BOTOX. NOW SYMPTOMATIC. PT MOST LIKELY NEED TO BE RE-EVALUATED AT [facility]. WILL AWAIT CT.  Plan: 1. UGI ON MAR 10 IF CT DOES NOT REVEAL AN ETIOLOGY FOR HER Sx. 2. CLEAR LIQUIDS/RESOURCE FRUIT BEVERAGE TID. 3. BID PPI. 4. SUPPORTIVE CARE. 5. D/C REGLAN-should not be used in a pt with GOO. START ZOFRAN ATC.   LOS: 4 days   Marsel Gail  03/29/2011, 12:49 PM

## 2011-03-29 NOTE — Progress Notes (Signed)
Subjective: The patient continues to complain of diffuse abdominal pain and epigastric abdominal cramping. She had an episode of vomiting this morning but it was not witnessed by the nursing staff. Overnight, Reglan and Carafate were started. She had a small bowel movement this morning without blood. It was semi-formed.  Objective: Vital signs in last 24 hours: Filed Vitals:   03/28/11 1921 03/28/11 2044 03/29/11 0603 03/29/11 0816  BP:  153/75 119/77   Pulse:  54 68   Temp:  99.3 F (37.4 C) 98.3 F (36.8 C)   TempSrc:  Oral Oral   Resp:  18 18   Height:      Weight:      SpO2: 99% 100% 97% 100%    Intake/Output Summary (Last 24 hours) at 03/29/11 1039 Last data filed at 03/29/11 7829  Gross per 24 hour  Intake 3156.33 ml  Output    200 ml  Net 2956.33 ml    Weight change:   Physical exam: General: She is is sitting up in bed, crying because she is in so much pain. Lungs: Clear to auscultation bilaterally. Heart: S1, S2, with no murmurs rubs or gallops. Abdomen: Positive bowel sounds, soft, mildly tender over the hypogastrium, no distention, no rigidity or guarding. Extremities: No pedal edema.   Lab Results: Basic Metabolic Panel:  Basename 03/28/11 1659 03/28/11 0614  NA 136 135  K 3.4* 3.3*  CL 99 99  CO2 27 25  GLUCOSE 95 95  BUN 10 11  CREATININE 0.87 0.75  CALCIUM 9.8 9.8  MG -- --  PHOS -- --   Liver Function Tests: No results found for this basename: AST:2,ALT:2,ALKPHOS:2,BILITOT:2,PROT:2,ALBUMIN:2 in the last 72 hours  Basename 03/28/11 0614  LIPASE 19  AMYLASE --   No results found for this basename: AMMONIA:2 in the last 72 hours CBC:  Basename 03/28/11 0614  WBC 9.9  NEUTROABS --  HGB 11.3*  HCT 33.4*  MCV 88.4  PLT 155   Cardiac Enzymes:  Basename 03/27/11 0518  CKTOTAL 293*  CKMB 2.4  CKMBINDEX --  TROPONINI <0.30   BNP: No results found for this basename: PROBNP:3 in the last 72 hours D-Dimer: No results found for this  basename: DDIMER:2 in the last 72 hours CBG: No results found for this basename: GLUCAP:6 in the last 72 hours Hemoglobin A1C: No results found for this basename: HGBA1C in the last 72 hours Fasting Lipid Panel: No results found for this basename: CHOL,HDL,LDLCALC,TRIG,CHOLHDL,LDLDIRECT in the last 72 hours Thyroid Function Tests: No results found for this basename: TSH,T4TOTAL,FREET4,T3FREE,THYROIDAB in the last 72 hours Anemia Panel: No results found for this basename: VITAMINB12,FOLATE,FERRITIN,TIBC,IRON,RETICCTPCT in the last 72 hours Coagulation: No results found for this basename: LABPROT:2,INR:2 in the last 72 hours Urine Drug Screen: Drugs of Abuse  No results found for this basename: labopia,  cocainscrnur,  labbenz,  amphetmu,  thcu,  labbarb    Alcohol Level: No results found for this basename: ETH:2 in the last 72 hours Urinalysis:  Basename 03/26/11 1045  COLORURINE YELLOW  LABSPEC 1.030  PHURINE 6.0  GLUCOSEU NEGATIVE  HGBUR NEGATIVE  BILIRUBINUR NEGATIVE  KETONESUR >80*  PROTEINUR NEGATIVE  UROBILINOGEN 0.2  NITRITE NEGATIVE  LEUKOCYTESUR NEGATIVE   Misc. Labs:   Micro: No results found for this or any previous visit (from the past 240 hour(s)).  Studies/Results: No results found.  Medications: I have reviewed the patient's current medications.  Assessment: Principal Problem:  *Colitis - presumed infectious origin Active Problems:  DEPRESSION  GERD (  gastroesophageal reflux disease)  Intractable vomiting  Abdominal pain  Vomiting   1.Continued abdominal pain, nausea, and vomiting in the setting of Colitis. IV Flagyl and Cipro were started on admission for treatment. Flagyl was discontinued yesterday because it was thought to be the etiology of her ongoing nausea and vomiting. She remains on Cipro. Her abdomen is not particularly distended or rigid and on exam, she has mild epigastric tenderness. The etiology of her ongoing symptoms is unclear. As  above, sucralfate and Reglan were started yesterday. Scheduled Zofran apparently has not been effective and it will be discontinued. She remains on IV Protonix. Of note, she is now afebrile and her white blood cell count has normalized. Her lipase was within normal limits yesterday.  2. Mild dilutional anemia.  3. Depression. Stable. Celexa was discontinued yesterday because of the ongoing nausea and vomiting.  4. Hypokalemia. Because of her ongoing nausea and vomiting, she is being repleted via the IV fluids. Her blood magnesium level is 1.6.  Plan:  1. We'll discontinue oral opiate in favor of IV morphine when necessary. We'll continue IV Protonix, sucralfate, and IV Reglan as scheduled. 2. We'll check her liver transaminases again today. Her lipase was within normal limits yesterday. 3. We will order a followup CT scan of her abdomen and pelvis for further evaluation. 4. We will consult GI.   LOS: 4 days   Silvano Garofano 03/29/2011, 10:39 AM

## 2011-03-30 ENCOUNTER — Inpatient Hospital Stay (HOSPITAL_COMMUNITY): Payer: Medicare Other

## 2011-03-30 ENCOUNTER — Encounter (HOSPITAL_COMMUNITY): Payer: Self-pay | Admitting: Internal Medicine

## 2011-03-30 DIAGNOSIS — R112 Nausea with vomiting, unspecified: Secondary | ICD-10-CM

## 2011-03-30 DIAGNOSIS — K311 Adult hypertrophic pyloric stenosis: Secondary | ICD-10-CM

## 2011-03-30 DIAGNOSIS — R109 Unspecified abdominal pain: Secondary | ICD-10-CM

## 2011-03-30 DIAGNOSIS — K313 Pylorospasm, not elsewhere classified: Secondary | ICD-10-CM

## 2011-03-30 HISTORY — DX: Pylorospasm, not elsewhere classified: K31.3

## 2011-03-30 LAB — COMPREHENSIVE METABOLIC PANEL
Albumin: 3.9 g/dL (ref 3.5–5.2)
BUN: 6 mg/dL (ref 6–23)
Calcium: 9.5 mg/dL (ref 8.4–10.5)
Creatinine, Ser: 0.91 mg/dL (ref 0.50–1.10)
GFR calc Af Amer: >90 mL/min (ref >90)
Glucose, Bld: 95 mg/dL (ref 70–99)
Potassium: 3.9 mEq/L (ref 3.5–5.1)
Total Protein: 6.6 g/dL (ref 6.0–8.3)

## 2011-03-30 NOTE — Progress Notes (Signed)
Subjective: The patient denies abdominal pain, nausea, or vomiting.  Objective: Vital signs in last 24 hours: Filed Vitals:   03/30/11 0110 03/30/11 0429 03/30/11 0756 03/30/11 1421  BP:  132/82  137/78  Pulse:  63  57  Temp:  97.9 F (36.6 C)  98.6 F (37 C)  TempSrc:  Oral  Oral  Resp:  18  16  Height:      Weight:      SpO2: 99% 99% 99% 99%    Intake/Output Summary (Last 24 hours) at 03/30/11 1654 Last data filed at 03/30/11 1200  Gross per 24 hour  Intake 2142.5 ml  Output      0 ml  Net 2142.5 ml    Weight change:   Physical exam: General: No acute distress. Lungs: Clear to auscultation bilaterally. Heart: S1, S2, with no murmurs rubs or gallops. Abdomen: Positive bowel sounds, soft, nontender and nondistended. Extremities: No pedal edema.   Lab Results: Basic Metabolic Panel:  Basename 03/30/11 0448 03/29/11 1149  NA 140 136  K 3.9 3.6  CL 103 100  CO2 29 28  GLUCOSE 95 123*  BUN 6 8  CREATININE 0.91 0.87  CALCIUM 9.5 9.9  MG -- --  PHOS -- --   Liver Function Tests:  Basename 03/30/11 0448 03/29/11 1149  AST 15 18  ALT 13 16  ALKPHOS 47 54  BILITOT 0.5 0.5  PROT 6.6 7.3  ALBUMIN 3.9 4.2    Basename 03/28/11 0614  LIPASE 19  AMYLASE --   No results found for this basename: AMMONIA:2 in the last 72 hours CBC:  Basename 03/28/11 0614  WBC 9.9  NEUTROABS --  HGB 11.3*  HCT 33.4*  MCV 88.4  PLT 155   Cardiac Enzymes: No results found for this basename: CKTOTAL:3,CKMB:3,CKMBINDEX:3,TROPONINI:3 in the last 72 hours BNP: No results found for this basename: PROBNP:3 in the last 72 hours D-Dimer: No results found for this basename: DDIMER:2 in the last 72 hours CBG: No results found for this basename: GLUCAP:6 in the last 72 hours Hemoglobin A1C: No results found for this basename: HGBA1C in the last 72 hours Fasting Lipid Panel: No results found for this basename: CHOL,HDL,LDLCALC,TRIG,CHOLHDL,LDLDIRECT in the last 72  hours Thyroid Function Tests: No results found for this basename: TSH,T4TOTAL,FREET4,T3FREE,THYROIDAB in the last 72 hours Anemia Panel: No results found for this basename: VITAMINB12,FOLATE,FERRITIN,TIBC,IRON,RETICCTPCT in the last 72 hours Coagulation: No results found for this basename: LABPROT:2,INR:2 in the last 72 hours Urine Drug Screen: Drugs of Abuse  No results found for this basename: labopia,  cocainscrnur,  labbenz,  amphetmu,  thcu,  labbarb    Alcohol Level: No results found for this basename: ETH:2 in the last 72 hours Urinalysis: No results found for this basename: COLORURINE:2,APPERANCEUR:2,LABSPEC:2,PHURINE:2,GLUCOSEU:2,HGBUR:2,BILIRUBINUR:2,KETONESUR:2,PROTEINUR:2,UROBILINOGEN:2,NITRITE:2,LEUKOCYTESUR:2 in the last 72 hours Misc. Labs:   Micro: No results found for this or any previous visit (from the past 240 hour(s)).  Studies/Results: Ct Abdomen Pelvis W Contrast  03/29/2011  *RADIOLOGY REPORT*  Clinical Data: Nausea, vomiting and abdominal pain.  History of colitis.  CT ABDOMEN AND PELVIS WITH CONTRAST  Technique:  Multidetector CT imaging of the abdomen and pelvis was performed following the standard protocol during bolus administration of intravenous contrast.  Contrast: OMNIPAQUE IOHEXOL 300 MG/ML IJ SOLN  Comparison: CT of abdomen and pelvis 03/25/2011.  Findings:  Lung Bases: Unremarkable.  Abdomen/Pelvis:  Status post cholecystectomy.  Focal area of decreased attenuation adjacent the falciform ligament is most consistent with a perfusion anomaly. 2 mm  low attenuation lesion in segment eight of the liver is unchanged compared to numerous prior examinations, and although too small to characterize is likely a benign lesion such as a cyst.  The remainder of the liver is otherwise unremarkable in appearance.  The enhanced appearance of the pancreas, spleen, bilateral adrenal glands and right kidney is unremarkable. A 7 mm intermediate attenuation (22 HU) lesion  in the left kidney is unchanged in retrospect compared to prior examinations, but it is too small to characterize.  Normal appendix (retrocecal). There is no ascites or pneumoperitoneum and no pathologic distension of bowel.  No pathologic adenopathy appreciated within the abdomen or pelvis on today's examination. No definite colonic wall thickening is appreciated on today's examination to strongly suggest the presence of colitis.  Musculoskeletal: There are no aggressive appearing lytic or blastic lesions noted in the visualized portions of the skeleton.  IMPRESSION: 1.  No definite acute findings in the abdomen or pelvis on today's examination to account for the patient's symptoms. 2.  Previously noted colonic wall thickening is no longer clearly evident.  This finding may have been related to under distension of the colon on the prior examination.  No definite signs of colitis are noted on today's examination. 3.  Status post cholecystectomy. 4.  7 mm intermediate attenuation lesion in the left kidney is too small to characterize, however, this finding is similar in comparison to numerous prior examinations dating back to at least 2010, and it is therefore favored to represent a small cyst.  Original Report Authenticated By: Florencia Reasons, M.D.   Dg Kayleen Memos W/high Density W/kub  03/30/2011  *RADIOLOGY REPORT*  Clinical Data:  Nausea and vomiting.  History of pylorospasm.  UPPER GI SERIES WITH KUB  Technique:  Routine upper GI series was performed with  Fluoroscopy Time: 5.5 minutes  Comparison:  CT of 03/29/2011.  Acute abdomen series of 03/25/2011. Upper GI series of 12/30/2010.  Findings: The procedure scout film demonstrates a nonobstructive bowel gas pattern.  No gastric distention.  Cholecystectomy clips. No free intraperitoneal air.  Double contrast exam was performed with concentration on the stomach (the patient describes no esophageal symptoms).  Limited esophageal double contrast imaging  demonstrates no mucosal abnormality.  Double contrast evaluation of the stomach demonstrates no retained food or secretions within the stomach.  No gastric ulcer or mass.  Prompt passage of contrast into the duodenal bulb and C-loop. There is no persistent narrowing within the pylorus to suggest active pylorospasm or stenosis.  The duodenal C-loop is normal in caliber.  Pylorus is best evaluated and distended on series 41, 44, and 45.  IMPRESSION: No evidence of t persistent pyloric narrowing to suggest pylorospasm or stricture.  Please note that the study was performed primarily to evaluate the stomach.  Only minimal imaging of the esophagus was performed, given lack of esophageal symptoms and radiation concerns in this 31- year-old patient.  Original Report Authenticated By: Consuello Bossier, M.D.    Medications: I have reviewed the patient's current medications.  Assessment: Principal Problem:  *Colitis - presumed infectious origin Active Problems:  DEPRESSION  GERD (gastroesophageal reflux disease)  Intractable vomiting  Abdominal pain  Vomiting  Pyloric spasm   1.Abdominal pain, nausea, and vomiting in the setting of presumed Colitis. Followup CT of the abdomen without evidence of colitis. Therefore Cipro will also be discontinued. Dr. Darrick Penna assessment noted and appreciated. The patient has a history of pylorospasm with treatment with Botox in the past. The upper GI  series today revealed findings not suggestive of pylorospasm.   2. Mild dilutional anemia.  3. Depression. Stable. Cymbalta was discontinued yesterday because of the ongoing nausea and vomiting.  4. Hypokalemia. Because of her ongoing nausea and vomiting, she is being repleted via the IV fluids. Her blood magnesium level is 1.6.  Plan:  1. Discontinue Cipro. 2. Advance diet when okay with GI.   LOS: 5 days   Numa Heatwole 03/30/2011, 4:54 PM

## 2011-03-30 NOTE — Progress Notes (Signed)
Subjective: Pt denies abdominal pain, nausea or vomiting in 24 hrs.    Objective: Vital signs in last 24 hours: Temp:  [97.9 F (36.6 C)-99.5 F (37.5 C)] 97.9 F (36.6 C) (03/11 0429) Pulse Rate:  [60-72] 63  (03/11 0429) Resp:  [18] 18  (03/11 0429) BP: (128-132)/(73-85) 132/82 mmHg (03/11 0429) SpO2:  [99 %-100 %] 99 % (03/11 0756) Last BM Date: 03/29/11 General:   Alert,  Well-developed, well-nourished, pleasant and cooperative in NAD Eyes:  Sclera clear, no icterus.   Conjunctiva pink. Mouth:  No deformity or lesions, oropharynx pink & moist. Heart:  Regular rate and rhythm; no murmurs, clicks, rubs,  or gallops. Abdomen:  Soft, nontender and nondistended. No masses, hepatosplenomegaly or hernias noted. Normal bowel sounds, without guarding, and without rebound.   Msk:  Symmetrical without gross deformities.  Pulses:  Normal pulses noted. Extremities:  Without clubbing or edema. Neurologic:  Alert and  oriented x4;  grossly normal neurologically. Skin:  Intact without significant lesions or rashes.  Intake/Output from previous day: 03/10 0701 - 03/11 0700 In: 2582.5 [P.O.:840; I.V.:1342.5; IV Piggyback:400] Out: -   Lab Results:  [facility] 03/28/11 0614  WBC 9.9  HGB 11.3*  HCT 33.4*  PLT 155   BMET  Basename 03/30/11 0448 03/29/11 1149 03/28/11 1659  NA 140 136 136  K 3.9 3.6 3.4*  CL 103 100 99  CO2 29 28 27   GLUCOSE 95 123* 95  BUN 6 8 10   CREATININE 0.91 0.87 0.87  CALCIUM 9.5 9.9 9.8   LFT  Basename 03/30/11 0448 03/29/11 1149 03/28/11 0614  PROT 6.6 7.3 --  ALBUMIN 3.9 4.2 --  AST 15 18 --  ALT 13 16 --  ALKPHOS 47 54 --  BILITOT 0.5 0.5 --  BILIDIR -- -- --  IBILI -- -- --  LIPASE -- -- 19  AMYLASE -- -- --   Studies/Results: Ct Abdomen Pelvis W Contrast  03/29/2011  *RADIOLOGY REPORT* IMPRESSION: 1.  No definite acute findings in the abdomen or pelvis on today's examination to account for the patient's symptoms. 2.  Previously noted  colonic wall thickening is no longer clearly evident.  This finding may have been related to under distension of the colon on the prior examination.  No definite signs of colitis are noted on today's examination. 3.  Status post cholecystectomy. 4.  7 mm intermediate attenuation lesion in the left kidney is too small to characterize, however, this finding is similar in comparison to numerous prior examinations dating back to at least 2010, and it is therefore favored to represent a small cyst.  Original Report Authenticated By: Florencia Reasons, M.D.   Assessment: Pylorospasm w/ recurrent nausea, vomiting, abd pain:  N/V resolved.  S/p Botox injection x1.  CT benign.    Plan: 1. UGI to look for recurrent pylorospasm/pyloric channel obstruction.  2. If persistent may need repeat Botox at [facility] 3. Continue supportive measures  LOS: 5 days   Lorenza Burton  03/30/2011, 9:39 AM

## 2011-03-30 NOTE — Plan of Care (Signed)
Problem: Phase I Progression Outcomes Goal: Pain controlled with appropriate interventions Outcome: Progressing No c/o of pain this today so far. Goal: OOB as tolerated unless otherwise ordered Outcome: Progressing Pt OOB to the bathroom

## 2011-03-30 NOTE — Progress Notes (Signed)
REVIEWED.  

## 2011-03-31 ENCOUNTER — Encounter: Payer: Self-pay | Admitting: Urgent Care

## 2011-03-31 MED ORDER — SODIUM CHLORIDE 0.9 % IJ SOLN
INTRAMUSCULAR | Status: AC
  Administered 2011-03-31: 10 mL
  Filled 2011-03-31: qty 3

## 2011-03-31 MED ORDER — ONDANSETRON HCL 4 MG PO TABS: 4.0000 mg | ORAL_TABLET | Freq: Three times a day (TID) | ORAL | Status: DC | PRN

## 2011-03-31 NOTE — Discharge Instructions (Signed)
Cholesterol Control Diet  Cholesterol levels in your body are determined significantly by your diet. Cholesterol levels may also be related to heart disease. The following material helps to explain this relationship and discusses what you can do to help keep your heart healthy. Not all cholesterol is bad. Low-density lipoprotein (LDL) cholesterol is the "bad" cholesterol. It may cause fatty deposits to build up inside your arteries. High-density lipoprotein (HDL) cholesterol is "good." It helps to remove the "bad" LDL cholesterol from your blood. Cholesterol is a very important risk factor for heart disease. Other risk factors are high blood pressure, smoking, stress, heredity, and weight.  The heart muscle gets its supply of blood through the coronary arteries. If your LDL cholesterol is high and your HDL cholesterol is low, you are at risk for having fatty deposits build up in your coronary arteries. This leaves less room through which blood can flow. Without sufficient blood and oxygen, the heart muscle cannot function properly and you may feel chest pains (angina pectoris). When a coronary artery closes up entirely, a part of the heart muscle may die, causing a heart attack (myocardial infarction).  CHECKING CHOLESTEROL  When your caregiver sends your blood to a lab to be analyzed for cholesterol, a complete lipid (fat) profile may be done. With this test, the total amount of cholesterol and levels of LDL and HDL are determined. Triglycerides are a type of fat that circulates in the blood and can also be used to determine heart disease risk. The list below describes what the numbers should be:  Test: Total Cholesterol.   Less than 200 mg/dl.   Test: LDL "bad cholesterol."   Less than 100 mg/dl.    Less than 70 mg/dl if you are at very high risk of a heart attack or sudden cardiac death.   Test: HDL "good cholesterol."   Greater than 50 mg/dl for women.    Greater than 40 mg/dl for men.    Test: Triglycerides.   Less than 150 mg/dl.   CONTROLLING CHOLESTEROL WITH DIET  Although exercise and lifestyle factors are important, your diet is key. That is because certain foods are known to raise cholesterol and others to lower it. The goal is to balance foods for their effect on cholesterol and more importantly, to replace saturated and trans fat with other types of fat, such as monounsaturated fat, polyunsaturated fat, and omega-3 fatty acids.  On average, a person should consume no more than 15 to 17 g of saturated fat daily. Saturated and trans fats are considered "bad" fats, and they will raise LDL cholesterol. Saturated fats are primarily found in animal products such as meats, butter, and cream. However, that does not mean you need to sacrifice all your favorite foods. Today, there are good tasting, low-fat, low-cholesterol substitutes for most of the things you like to eat. Choose low-fat or nonfat alternatives. Choose round or loin cuts of red meat, since these types of cuts are lowest in fat and cholesterol. Chicken (without the skin), fish, veal, and ground turkey breast are excellent choices. Eliminate fatty meats, such as hot dogs and salami. Even shellfish have little or no saturated fat. Have a 3 oz (85 g) portion when you eat lean meat, poultry, or fish.  Trans fats are also called "partially hydrogenated oils." They are oils that have been scientifically manipulated so that they are solid at room temperature resulting in a longer shelf life and improved taste and texture of foods in which they are   added. Trans fats are found in stick margarine, some tub margarines, cookies, crackers, and baked goods.    When baking and cooking, oils are an excellent substitute for butter. The monounsaturated oils are especially beneficial since it is believed they lower LDL and raise HDL. The oils you should avoid entirely are saturated tropical oils, such as coconut and palm.     Remember to eat liberally from food groups that are naturally free of saturated and trans fat, including fish, fruit, vegetables, beans, grains (barley, rice, couscous, bulgur wheat), and pasta (without cream sauces).    IDENTIFYING FOODS THAT LOWER CHOLESTEROL    Soluble fiber may lower your cholesterol. This type of fiber is found in fruits such as apples, vegetables such as broccoli, potatoes, and carrots, legumes such as beans, peas, and lentils, and grains such as barley. Foods fortified with plant sterols (phytosterol) may also lower cholesterol. You should eat at least 2 g per day of these foods for a cholesterol lowering effect.    Read package labels to identify low-saturated fats, trans fats free, and low-fat foods at the supermarket. Select cheeses that have only 2 to 3 g saturated fat per ounce. Use a heart-healthy tub margarine that is free of trans fats or partially hydrogenated oil. When buying baked goods (cookies, crackers), avoid partially hydrogenated oils. Breads and muffins should be made from whole grains (whole-wheat or whole oat flour, instead of "flour" or "enriched flour"). Buy non-creamy canned soups with reduced salt and no added fats.    FOOD PREPARATION TECHNIQUES    Never deep-fry. If you must fry, either stir-fry, which uses very little fat, or use non-stick cooking sprays. When possible, broil, bake, or roast meats, and steam vegetables. Instead of dressing vegetables with butter or margarine, use lemon and herbs, applesauce and cinnamon (for squash and sweet potatoes), nonfat yogurt, salsa, and low-fat dressings for salads.    LOW-SATURATED FAT / LOW-FAT FOOD SUBSTITUTES  Meats / Saturated Fat (g)   Avoid: Steak, marbled (3 oz/85 g) / 11 g    Choose: Steak, lean (3 oz/85 g) / 4 g    Avoid: Hamburger (3 oz/85 g) / 7 g    Choose: Hamburger, lean (3 oz/85 g) / 5 g    Avoid: Ham (3 oz/85 g) / 6 g    Choose: Ham, lean cut (3 oz/85 g) / 2.4 g     Avoid: Chicken, with skin, dark meat (3 oz/85 g) / 4 g    Choose: Chicken, skin removed, dark meat (3 oz/85 g) / 2 g    Avoid: Chicken, with skin, light meat (3 oz/85 g) / 2.5 g    Choose: Chicken, skin removed, light meat (3 oz/85 g) / 1 g   Dairy / Saturated Fat (g)   Avoid: Whole milk (1 cup) / 5 g    Choose: Low-fat milk, 2% (1 cup) / 3 g    Choose: Low-fat milk, 1% (1 cup) / 1.5 g    Choose: Skim milk (1 cup) / 0.3 g    Avoid: Hard cheese (1 oz/28 g) / 6 g    Choose: Skim milk cheese (1 oz/28 g) / 2 to 3 g    Avoid: Cottage cheese, 4% fat (1 cup) / 6.5 g    Choose: Low-fat cottage cheese, 1% fat (1 cup) / 1.5 g    Avoid: Ice cream (1 cup) / 9 g    Choose: Sherbet (1 cup) / 2.5 g      Choose: Nonfat frozen yogurt (1 cup) / 0.3 g    Choose: Frozen fruit bar / trace    Avoid: Whipped cream (1 tbs) / 3.5 g    Choose: Nondairy whipped topping (1 tbs) / 1 g   Condiments / Saturated Fat (g)   Avoid: Mayonnaise (1 tbs) / 2 g    Choose: Low-fat mayonnaise (1 tbs) / 1 g    Avoid: Butter (1 tbs) / 7 g    Choose: Extra light margarine (1 tbs) / 1 g    Avoid: Coconut oil (1 tbs) / 11.8 g    Choose: Olive oil (1 tbs) / 1.8 g    Choose: Corn oil (1 tbs) / 1.7 g    Choose: Safflower oil (1 tbs) / 1.2 g    Choose: Sunflower oil (1 tbs) / 1.4 g    Choose: Soybean oil (1 tbs) / 2.4 g    Choose: Canola oil (1 tbs) / 1 g   Document Released: 01/05/2005 Document Revised: 12/25/2010 Document Reviewed: 06/26/2010  [employer] Patient Information 2012 [employer].

## 2011-03-31 NOTE — Progress Notes (Signed)
Subjective: Patient denies any abdominal pain, nausea, or vomiting. Upper GI shows no evidence of persistent pyloric narrowing to suggest pylorospasm or stricture. Tolerating full liquids well.  Objective: Vital signs in last 24 hours: Temp:  [98.1 F (36.7 C)-98.6 F (37 C)] 98.2 F (36.8 C) (03/12 0533) Pulse Rate:  [56-64] 56  (03/12 0533) Resp:  [16-18] 18  (03/12 0533) BP: (107-138)/(67-78) 107/69 mmHg (03/12 0533) SpO2:  [98 %-100 %] 99 % (03/12 0805) Last BM Date: 03/29/11 General:   Alert,  Well-developed, well-nourished, pleasant and cooperative in NAD Eyes:  Sclera clear, no icterus.   Conjunctiva pink. Mouth:  No deformity or lesions, oropharynx pink & moist. Heart:  Regular rate and rhythm; no murmurs, clicks, rubs,  or gallops. Abdomen:  Soft, nontender and nondistended. No masses, hepatosplenomegaly or hernias noted. Normal bowel sounds, without guarding, and without rebound.   Msk:  Symmetrical without gross deformities.  Pulses:  Normal pulses noted. Extremities:  Without clubbing or edema. Neurologic:  Alert and  oriented x4;  grossly normal neurologically. Skin:  Intact without significant lesions or rashes.  Intake/Output from previous day: 03/11 0701 - 03/12 0700 In: 720 [P.O.:720] Out: -   Lab Results: BMET  Basename 03/30/11 0448 03/29/11 1149 03/28/11 1659  NA 140 136 136  K 3.9 3.6 3.4*  CL 103 100 99  CO2 29 28 27   GLUCOSE 95 123* 95  BUN 6 8 10   CREATININE 0.91 0.87 0.87  CALCIUM 9.5 9.9 9.8   LFT  Basename 03/30/11 0448 03/29/11 1149  PROT 6.6 7.3  ALBUMIN 3.9 4.2  AST 15 18  ALT 13 16  ALKPHOS 47 54  BILITOT 0.5 0.5  BILIDIR -- --  IBILI -- --  LIPASE -- --  AMYLASE -- --   Assessment: 1. Acute nausea vomiting abdominal pain:  Suspect acute gastroenteritis versus food borne illness given her clinical course. 2. History of Pylorospasm s/p Botox injection x1.  Upper GI series benign.  Plan: 1. Low fat, low cholesterol diet  advance as tolerated 2. Followup outpatient 4 wks         LOS: 6 days   Tracey Daniel  03/31/2011, 9:19 AM

## 2011-03-31 NOTE — Discharge Summary (Signed)
Physician Discharge Summary  Tracey Daniel MRN: [id number] DOB/AGE: October 25, 1980 31 y.o.  PCP: Syliva Overman, MD, MD   Admit date: 03/25/2011 Discharge date: 03/31/2011  Discharge Diagnoses:  1. Presumed acute colitis versus an acute gastroenteritis. 2. History of pyloric spasm, status post Botox injections in the past. 3. Depression. Stable. 4. Gastroesophageal reflux disease. 5. Hypercalcemia, secondary to dehydration. Resolved.    Medication List  As of 03/31/2011 12:55 PM   TAKE these medications         albuterol 108 (90 BASE) MCG/ACT inhaler   Commonly known as: PROVENTIL HFA;VENTOLIN HFA   Inhale 2 puffs into the lungs every 6 (six) hours as needed. Shortness of breath      budesonide-formoterol 80-4.5 MCG/ACT inhaler   Commonly known as: SYMBICORT   Inhale 2 puffs into the lungs 2 (two) times daily.      cyclobenzaprine 10 MG tablet   Commonly known as: FLEXERIL   Take 10 mg by mouth 3 (three) times daily as needed. For muscle spasms      DULoxetine 30 MG capsule   Commonly known as: CYMBALTA   Take 1 capsule (30 mg total) by mouth daily.      ESTRACE VAGINAL 0.1 MG/GM vaginal cream   Generic drug: estradiol   Place 2 g vaginally daily. As directed      estradiol-norethindrone 1-0.5 MG per tablet   Commonly known as: ACTIVELLA   Take 1 tablet by mouth daily.      feeding supplement Liqd   Take 237 mLs by mouth 3 (three) times daily with meals.      ferrous sulfate 325 (65 FE) MG tablet   Take 325 mg by mouth daily with breakfast.      flintstones complete 60 MG chewable tablet   Chew 1 tablet by mouth daily.      HYDROcodone-acetaminophen 7.5-325 MG per tablet   Commonly known as: NORCO   Take 1 tablet by mouth every 4 (four) hours as needed.      ondansetron 4 MG tablet   Commonly known as: ZOFRAN   Take 1 tablet (4 mg total) by mouth every 8 (eight) hours as needed for nausea.      pantoprazole 40 MG tablet   Commonly known as: PROTONIX    Take 1 tablet (40 mg total) by mouth daily.            Discharge Condition: Improved.  Disposition: 01-Home or Self Care   Consults: Jonette Eva, M.D.   Significant Diagnostic Studies: Ct Abdomen Pelvis W Contrast  03/29/2011  *RADIOLOGY REPORT*  Clinical Data: Nausea, vomiting and abdominal pain.  History of colitis.  CT ABDOMEN AND PELVIS WITH CONTRAST  Technique:  Multidetector CT imaging of the abdomen and pelvis was performed following the standard protocol during bolus administration of intravenous contrast.  Contrast: OMNIPAQUE IOHEXOL 300 MG/ML IJ SOLN  Comparison: CT of abdomen and pelvis 03/25/2011.  Findings:  Lung Bases: Unremarkable.  Abdomen/Pelvis:  Status post cholecystectomy.  Focal area of decreased attenuation adjacent the falciform ligament is most consistent with a perfusion anomaly. 2 mm low attenuation lesion in segment eight of the liver is unchanged compared to numerous prior examinations, and although too small to characterize is likely a benign lesion such as a cyst.  The remainder of the liver is otherwise unremarkable in appearance.  The enhanced appearance of the pancreas, spleen, bilateral adrenal glands and right kidney is unremarkable. A 7 mm intermediate attenuation (22  HU) lesion in the left kidney is unchanged in retrospect compared to prior examinations, but it is too small to characterize.  Normal appendix (retrocecal). There is no ascites or pneumoperitoneum and no pathologic distension of bowel.  No pathologic adenopathy appreciated within the abdomen or pelvis on today's examination. No definite colonic wall thickening is appreciated on today's examination to strongly suggest the presence of colitis.  Musculoskeletal: There are no aggressive appearing lytic or blastic lesions noted in the visualized portions of the skeleton.  IMPRESSION: 1.  No definite acute findings in the abdomen or pelvis on today's examination to account for the patient's  symptoms. 2.  Previously noted colonic wall thickening is no longer clearly evident.  This finding may have been related to under distension of the colon on the prior examination.  No definite signs of colitis are noted on today's examination. 3.  Status post cholecystectomy. 4.  7 mm intermediate attenuation lesion in the left kidney is too small to characterize, however, this finding is similar in comparison to numerous prior examinations dating back to at least 2010, and it is therefore favored to represent a small cyst.  Original Report Authenticated By: Florencia Reasons, M.D.   Ct Abdomen Pelvis W Contrast  03/25/2011  *RADIOLOGY REPORT*  Clinical Data: Abdominal pain and vomiting.  CT ABDOMEN AND PELVIS WITH CONTRAST  Technique:  Multidetector CT imaging of the abdomen and pelvis was performed following the standard protocol during bolus administration of intravenous contrast.  Contrast: OMNIPAQUE IOHEXOL 300 MG/ML IJ SOLN  Comparison: CT 12/18/2010  Findings: Lung bases are clear.  Gallbladder has been removed. Liver and bile ducts are normal.  Pancreas spleen and kidneys are normal.  The colon is decompressed but appears to be thickened.  Some this could be due to collapse however I am concerned that could be colitis present.  If present, this is most likely mild.  No free fluid or abscess is present.  Negative for bowel obstruction. Appendix is normal.  IMPRESSION: Possible thickening of the colon diffusely suggestive of colitis. Otherwise negative.  Original Report Authenticated By: Camelia Phenes, M.D.   Dg Abd Acute W/chest  03/25/2011  *RADIOLOGY REPORT*  Clinical Data: Abdominal pain and vomiting  ACUTE ABDOMEN SERIES (ABDOMEN 2 VIEW & CHEST 1 VIEW)  Comparison: 12/26/2010  Findings: Heart size is normal and the vascularity is normal. Lungs are clear.  Normal bowel gas pattern.  Negative for free intraperitoneal gas. Surgical clips in the gallbladder fossa.  No renal calculi.  No bony  abnormality.  IMPRESSION: Negative  Original Report Authenticated By: Camelia Phenes, M.D.   Varney Biles Kayleen Memos W/high Density W/kub  03/30/2011  *RADIOLOGY REPORT*  Clinical Data:  Nausea and vomiting.  History of pylorospasm.  UPPER GI SERIES WITH KUB  Technique:  Routine upper GI series was performed with  Fluoroscopy Time: 5.5 minutes  Comparison:  CT of 03/29/2011.  Acute abdomen series of 03/25/2011. Upper GI series of 12/30/2010.  Findings: The procedure scout film demonstrates a nonobstructive bowel gas pattern.  No gastric distention.  Cholecystectomy clips. No free intraperitoneal air.  Double contrast exam was performed with concentration on the stomach (the patient describes no esophageal symptoms).  Limited esophageal double contrast imaging demonstrates no mucosal abnormality.  Double contrast evaluation of the stomach demonstrates no retained food or secretions within the stomach.  No gastric ulcer or mass.  Prompt passage of contrast into the duodenal bulb and C-loop. There is no persistent narrowing within the  pylorus to suggest active pylorospasm or stenosis.  The duodenal C-loop is normal in caliber.  Pylorus is best evaluated and distended on series 41, 44, and 45.  IMPRESSION: No evidence of t persistent pyloric narrowing to suggest pylorospasm or stricture.  Please note that the study was performed primarily to evaluate the stomach.  Only minimal imaging of the esophagus was performed, given lack of esophageal symptoms and radiation concerns in this 45- year-old patient.  Original Report Authenticated By: Consuello Bossier, M.D.     Microbiology: No results found for this or any previous visit (from the past 240 hour(s)).   Labs: Results for orders placed during the hospital encounter of 03/25/11 (from the past 48 hour(s))  COMPREHENSIVE METABOLIC PANEL     Status: Abnormal   Collection Time   03/30/11  4:48 AM      Component Value Range Comment   Sodium 140  135 - 145 (mEq/L)    Potassium 3.9   3.5 - 5.1 (mEq/L)    Chloride 103  96 - 112 (mEq/L)    CO2 29  19 - 32 (mEq/L)    Glucose, Bld 95  70 - 99 (mg/dL)    BUN 6  6 - 23 (mg/dL)    Creatinine, Ser 1.61  0.50 - 1.10 (mg/dL)    Calcium 9.5  8.4 - 10.5 (mg/dL)    Total Protein 6.6  6.0 - 8.3 (g/dL)    Albumin 3.9  3.5 - 5.2 (g/dL)    AST 15  0 - 37 (U/L)    ALT 13  0 - 35 (U/L)    Alkaline Phosphatase 47  39 - 117 (U/L)    Total Bilirubin 0.5  0.3 - 1.2 (mg/dL)    GFR calc non Af Amer 82 (*) >90 (mL/min)    GFR calc Af Amer >90  >90 (mL/min)      HPI : The patient is a 31 year old woman with a past medical history significant for gastritis, pyloro- spasm, status post Botox injection in the past, who presented to the emergency department on March 25 2011 with a chief complaint of persistent nausea, vomiting, and abdominal pain. In the emergency department, her WBC was elevated at 19.1. Her lipase was within normal limits at 24. Her calcium was elevated at 11.4. Her liver transaminases were within normal limits. CT scan of her abdomen and pelvis revealed possible thickening of the colon diffusely, suggestive of colitis, otherwise negative. She was admitted for further evaluation and management.  HOSPITAL COURSE: The patient was started on IV fluid hydration. Phenergan and Reglan were ordered as needed for nausea and vomiting. Flagyl and Cipro were ordered empirically for treatment of colitis. IV Protonix was added for her history of gastritis. Her pain was treated with as needed morphine. The patient improved symptomatically, but it was short-term. She had recurrent persistent abdominal pain, cramping, nausea, and vomiting. After several days of treatment with IV Flagyl and Cipro, Flagyl was discontinued as it was thought to be a contributor to her nausea and vomiting. Subsequently, another CT scan of her abdomen and pelvis was ordered. It revealed no signs of colitis. The radiologist mentioned that the initial CT scan could have revealed  a underdistended colon, nevertheless, the diffuse thickening was no longer seen. Gastroenterologist, Dr. Darrick Penna was consulted when the patient's symptoms did not resolve. She discontinued Reglan as it should not be used in patients with potential gastric outlet obstruction. She started Zofran scheduled around the clock. Protonix was continued.  She ordered an upper GI series for further evaluation. The upper GI series revealed no obvious signs of pyloric spasm or pyloric stricture.  The patient's symptoms eventually resolved. Her diet was advanced which she tolerated well. If her symptoms worsen in the outpatient setting, she may need further evaluation at [facility] for potential repeat Botox injection. She was advised to follow a low-fat diet upon discharge. Cipro was discontinued upon discharge. She remained afebrile and hemodynamically stable. Her white blood cell count normalized completely.    Discharge Exam: Blood pressure 107/69, pulse 56, temperature 98.2 F (36.8 C), temperature source Oral, resp. rate 18, height 5\' 6"  (1.676 m), weight 61.5 kg (135 lb 9.3 oz), last menstrual period 01/19/1997, SpO2 99.00%. Lungs: Clear to auscultation bilaterally. Heart: S1, S2, with no murmurs rubs or gallops. Abdomen: Positive bowel sounds, soft, nontender, nondistended. Extremities: No pedal edema.    Discharge Orders    Future Appointments: Provider: Department: Dept Phone: Center:   05/01/2011 11:00 AM Joselyn Arrow, NP [facility] [phone] [facility]   08/31/2011 8:45 AM Kerri Perches, MD [facility] [phone] [facility]     Future Orders Please Complete By Expires   Diet - low sodium heart healthy      Increase activity slowly      Discharge instructions      Comments:   FOLLOW A GOODLOW FAT DIET.      Follow-up Information    Follow up with Syliva Overman, MD. Schedule an appointment as soon as possible for a visit in 1 week.      Follow up with Lorenza Burton,  NP on 05/01/2011. (At 11:00 am. For GI follow up.)    Contact information:   [address] [place] [address] [phone]           Total discharge time: 40 minutes.   Signed: Dessie Tatem 03/31/2011, 12:55 PM

## 2011-04-08 ENCOUNTER — Ambulatory Visit (INDEPENDENT_AMBULATORY_CARE_PROVIDER_SITE_OTHER): Payer: Medicare Other | Admitting: Family Medicine

## 2011-04-08 ENCOUNTER — Encounter: Payer: Self-pay | Admitting: Family Medicine

## 2011-04-08 VITALS — BP 114/62 | HR 63 | Resp 18 | Ht 66.0 in | Wt 138.1 lb

## 2011-04-08 DIAGNOSIS — K219 Gastro-esophageal reflux disease without esophagitis: Secondary | ICD-10-CM | POA: Diagnosis not present

## 2011-04-08 DIAGNOSIS — F329 Major depressive disorder, single episode, unspecified: Secondary | ICD-10-CM

## 2011-04-08 DIAGNOSIS — J309 Allergic rhinitis, unspecified: Secondary | ICD-10-CM

## 2011-04-08 DIAGNOSIS — K529 Noninfective gastroenteritis and colitis, unspecified: Secondary | ICD-10-CM

## 2011-04-08 DIAGNOSIS — K313 Pylorospasm, not elsewhere classified: Secondary | ICD-10-CM

## 2011-04-08 DIAGNOSIS — J45909 Unspecified asthma, uncomplicated: Secondary | ICD-10-CM | POA: Diagnosis not present

## 2011-04-08 DIAGNOSIS — F3289 Other specified depressive episodes: Secondary | ICD-10-CM

## 2011-04-08 MED ORDER — FLUTICASONE PROPIONATE 50 MCG/ACT NA SUSP: 2.0000 | Freq: Every day | NASAL | Status: DC

## 2011-04-08 NOTE — Patient Instructions (Addendum)
F/u in 6 month. Please cancel sooner appointment  New medication for allergies , use daily for Spring and Summer when you have uncontrolled symptoms  Please do not eat fried or fatty foods, this will cause stomach pain, you have no gallbladder

## 2011-04-08 NOTE — Progress Notes (Signed)
  Subjective:    Patient ID: Tracey Daniel, female    DOB: 05/19/1980, 31 y.o.   MRN: [id number]  HPI Pt seen in Ed approximately 2 weeks ago with abdominal pain , likely triggered by fried chicken and gravy which she had recently eaten.Required in patient stay for approximately 5 days, with a d/c diagnosis of presumed colitis. Pt now reports being totally asymptomatic and feeling well Currently asymptomatic.  She is actually receiving botox treatment for intractable recurrent abdominal, sent from her local [facility], [facility].    Review of Systems See HPI Denies recent fever or chills. Denies sinus pressure, nasal congestion, ear pain or sore throat. Denies chest congestion, productive cough or wheezing. Denies chest pains, palpitations and leg swelling Denies abdominal pain, nausea, vomiting,diarrhea or constipation.   Denies dysuria, frequency, hesitancy or incontinence. Denies joint pain, swelling and limitation in mobility. Denies headaches, seizures, numbness, or tingling. Denies uncontrolled  depression, anxiety or insomnia. Denies skin break down or rash.        Objective:   Physical Exam Patient alert and oriented and in no cardiopulmonary distress.  HEENT: No facial asymmetry, EOMI, no sinus tenderness,  oropharynx pink and moist.  Neck supple no adenopathy.  Chest: Clear to auscultation bilaterally.  CVS: S1, S2 no murmurs, no S3.  ABD: Soft non tender. Bowel sounds normal.  Ext: No edema  MS: Adequate ROM spine, shoulders, hips and knees.  Skin: Intact, no ulcerations or rash noted.  Psych: Good eye contact, normal affect. Memory intact not anxious or depressed appearing.  CNS: CN 2-12 intact, power, tone and sensation normal throughout.        Assessment & Plan:

## 2011-04-20 NOTE — Assessment & Plan Note (Signed)
Controlled, no change in medication  

## 2011-04-20 NOTE — Assessment & Plan Note (Signed)
Controlled, no change in medication Followed by psych 

## 2011-04-20 NOTE — Assessment & Plan Note (Signed)
Treated with botox per GI

## 2011-04-20 NOTE — Assessment & Plan Note (Signed)
Currently asymptomatic following recent hospitalization

## 2011-04-21 ENCOUNTER — Other Ambulatory Visit (HOSPITAL_COMMUNITY)
Admission: RE | Admit: 2011-04-21 | Discharge: 2011-04-21 | Disposition: A | Discharge status: Home / Self Care | Payer: Medicare Other | Source: Ambulatory Visit | Attending: Obstetrics & Gynecology | Admitting: Obstetrics & Gynecology

## 2011-04-21 ENCOUNTER — Other Ambulatory Visit: Payer: Self-pay | Admitting: Obstetrics & Gynecology

## 2011-04-21 DIAGNOSIS — Z113 Encounter for screening for infections with a predominantly sexual mode of transmission: Secondary | ICD-10-CM | POA: Insufficient documentation

## 2011-04-21 DIAGNOSIS — Z124 Encounter for screening for malignant neoplasm of cervix: Secondary | ICD-10-CM | POA: Diagnosis not present

## 2011-04-21 DIAGNOSIS — Z1389 Encounter for screening for other disorder: Secondary | ICD-10-CM | POA: Diagnosis not present

## 2011-04-27 ENCOUNTER — Encounter: Payer: Self-pay | Admitting: Family Medicine

## 2011-04-27 ENCOUNTER — Other Ambulatory Visit: Payer: Self-pay | Admitting: Gastroenterology

## 2011-04-27 ENCOUNTER — Ambulatory Visit (INDEPENDENT_AMBULATORY_CARE_PROVIDER_SITE_OTHER): Payer: Medicare Other | Admitting: Family Medicine

## 2011-04-27 VITALS — BP 132/80 | HR 67 | Resp 15 | Ht 66.0 in | Wt 140.0 lb

## 2011-04-27 DIAGNOSIS — IMO0002 Reserved for concepts with insufficient information to code with codable children: Secondary | ICD-10-CM | POA: Insufficient documentation

## 2011-04-27 DIAGNOSIS — L738 Other specified follicular disorders: Secondary | ICD-10-CM | POA: Insufficient documentation

## 2011-04-27 DIAGNOSIS — D649 Anemia, unspecified: Secondary | ICD-10-CM | POA: Diagnosis not present

## 2011-04-27 LAB — CBC WITH DIFFERENTIAL/PLATELET
Basophils Relative: 0 % (ref 0–1)
Eosinophils Absolute: 0.1 10*3/uL (ref 0.0–0.7)
HCT: 32.6 % — ABNORMAL LOW (ref 36.0–46.0)
Hemoglobin: 10.2 g/dL — ABNORMAL LOW (ref 12.0–15.0)
MCH: 28.7 pg (ref 26.0–34.0)
MCHC: 31.3 g/dL (ref 30.0–36.0)
MCV: 91.8 fL (ref 78.0–100.0)
Monocytes Absolute: 0.2 10*3/uL (ref 0.1–1.0)
Monocytes Relative: 4 % (ref 3–12)

## 2011-04-27 MED ORDER — DOXYCYCLINE HYCLATE 100 MG PO TABS: 100.0000 mg | ORAL_TABLET | Freq: Two times a day (BID) | ORAL | Status: AC

## 2011-04-27 MED ORDER — FLUCONAZOLE 150 MG PO TABS: ORAL_TABLET | ORAL | Status: AC

## 2011-04-27 MED ORDER — MUPIROCIN 2 % EX OINT: TOPICAL_OINTMENT | Freq: Three times a day (TID) | CUTANEOUS | Status: AC

## 2011-04-27 NOTE — Patient Instructions (Signed)
F/u as before.  Medication is sent in for acute infection of the hair follicle on your right inner thigh. This occurred while you were shaving  Medication is sent to your pharmacy. Use as directed, and stop shaving

## 2011-04-28 ENCOUNTER — Other Ambulatory Visit (HOSPITAL_COMMUNITY): Payer: Self-pay | Admitting: Orthopaedic Surgery

## 2011-04-28 DIAGNOSIS — M5137 Other intervertebral disc degeneration, lumbosacral region: Secondary | ICD-10-CM | POA: Diagnosis not present

## 2011-04-28 DIAGNOSIS — M545 Low back pain: Secondary | ICD-10-CM

## 2011-04-29 NOTE — Progress Notes (Signed)
Quick Note:  Called [facility] lab,spoke with Gertie Gowda- added on hemoglobin electrophoresis. ______

## 2011-04-29 NOTE — Progress Notes (Signed)
Quick Note:  No significant change or improvement in H/H. Prior to hematology referral, let's do hemoglobin electrophoresis. ______

## 2011-04-30 ENCOUNTER — Ambulatory Visit (HOSPITAL_COMMUNITY)
Admission: RE | Admit: 2011-04-30 | Discharge: 2011-04-30 | Disposition: A | Discharge status: Home / Self Care | Payer: Medicare Other | Source: Ambulatory Visit | Attending: Orthopaedic Surgery | Admitting: Orthopaedic Surgery

## 2011-04-30 DIAGNOSIS — M545 Low back pain, unspecified: Secondary | ICD-10-CM | POA: Insufficient documentation

## 2011-04-30 DIAGNOSIS — M25559 Pain in unspecified hip: Secondary | ICD-10-CM | POA: Insufficient documentation

## 2011-04-30 DIAGNOSIS — M47817 Spondylosis without myelopathy or radiculopathy, lumbosacral region: Secondary | ICD-10-CM | POA: Diagnosis not present

## 2011-05-01 ENCOUNTER — Encounter: Payer: Self-pay | Admitting: Urgent Care

## 2011-05-01 ENCOUNTER — Ambulatory Visit (INDEPENDENT_AMBULATORY_CARE_PROVIDER_SITE_OTHER): Payer: Medicare Other | Admitting: Urgent Care

## 2011-05-01 VITALS — BP 114/76 | HR 66 | Temp 97.5°F | Ht 66.0 in | Wt 139.6 lb

## 2011-05-01 DIAGNOSIS — K3184 Gastroparesis: Secondary | ICD-10-CM

## 2011-05-01 DIAGNOSIS — D649 Anemia, unspecified: Secondary | ICD-10-CM | POA: Diagnosis not present

## 2011-05-01 DIAGNOSIS — K313 Pylorospasm, not elsewhere classified: Secondary | ICD-10-CM

## 2011-05-01 DIAGNOSIS — K5900 Constipation, unspecified: Secondary | ICD-10-CM | POA: Insufficient documentation

## 2011-05-01 LAB — HEMOGLOBINOPATHY EVALUATION
Hemoglobin Other: 0 %
Hgb A2 Quant: 2.5 % (ref 2.2–3.2)

## 2011-05-01 NOTE — Patient Instructions (Addendum)
Colace 100mg  twice daily Begin miralax 17 grams daily for constipation We will call you with your lab results High fiber diet Continue iron daily Continue protonix 40mg  daily High Fiber Diet A high fiber diet changes your normal diet to include more whole grains, legumes, fruits, and vegetables. Changes in the diet involve replacing refined carbohydrates with unrefined foods. The calorie level of the diet is essentially unchanged. The Dietary Reference Intake (recommended amount) for adult males is 38 g per day. For adult females, it is 25 g per day. Pregnant and lactating women should consume 28 g of fiber per day. Fiber is the intact part of a plant that is not broken down during digestion. Functional fiber is fiber that has been isolated from the plant to provide a beneficial effect in the body. PURPOSE  Increase stool bulk.   Ease and regulate bowel movements.   Lower cholesterol.  INDICATIONS THAT YOU NEED MORE FIBER  Constipation and hemorrhoids.   Uncomplicated diverticulosis (intestine condition) and irritable bowel syndrome.   Weight management.   As a protective measure against hardening of the arteries (atherosclerosis), diabetes, and cancer.  NOTE OF CAUTION If you have a digestive or bowel problem, ask your caregiver for advice before adding high fiber foods to your diet. Some of the following medical problems are such that a high fiber diet should not be used without consulting your caregiver:  Acute diverticulitis (intestine infection).   Partial small bowel obstructions.   Complicated diverticular disease involving bleeding, rupture (perforation), or abscess (boil, furuncle).   Presence of autonomic neuropathy (nerve damage) or gastric paresis (stomach cannot empty itself).  GUIDELINES FOR INCREASING FIBER  Start adding fiber to the diet slowly. A gradual increase of about 5 more grams (2 slices of whole-wheat bread, 2 servings of most fruits or vegetables, or 1  bowl of high fiber cereal) per day is best. Too rapid an increase in fiber may result in constipation, flatulence, and bloating.   Drink enough water and fluids to keep your urine clear or pale yellow. Water, juice, or caffeine-free drinks are recommended. Not drinking enough fluid may cause constipation.   Eat a variety of high fiber foods rather than one type of fiber.   Try to increase your intake of fiber through using high fiber foods rather than fiber pills or supplements that contain small amounts of fiber.   The goal is to change the types of food eaten. Do not supplement your present diet with high fiber foods, but replace foods in your present diet.  INCLUDE A VARIETY OF FIBER SOURCES  Replace refined and processed grains with whole grains, canned fruits with fresh fruits, and incorporate other fiber sources. White rice, white breads, and most bakery goods contain little or no fiber.   Brown whole-grain rice, buckwheat oats, and many fruits and vegetables are all good sources of fiber. These include: broccoli, Brussels sprouts, cabbage, cauliflower, beets, sweet potatoes, white potatoes (skin on), carrots, tomatoes, eggplant, squash, berries, fresh fruits, and dried fruits.   Cereals appear to be the richest source of fiber. Cereal fiber is found in whole grains and bran. Bran is the fiber-rich outer coat of cereal grain, which is largely removed in refining. In whole-grain cereals, the bran remains. In breakfast cereals, the largest amount of fiber is found in those with "bran" in their names. The fiber content is sometimes indicated on the label.   You may need to include additional fruits and vegetables each day.   In  baking, for 1 cup white flour, you may use the following substitutions:   1 cup whole-wheat flour minus 2 tbs.    cup white flour plus  cup whole-wheat flour.  Document Released: 01/05/2005 Document Revised: 12/25/2010 Document Reviewed: 11/13/2008 [facility]  Patient Information 2012 [place], [place]. Constipation in Adults Constipation is having fewer than 2 bowel movements per week. Usually, the stools are hard. As we grow older, constipation is more common. If you try to fix constipation with laxatives, the problem may get worse. This is because laxatives taken over a long period of time make the colon muscles weaker. A low-fiber diet, not taking in enough fluids, and taking some medicines may make these problems worse. MEDICATIONS THAT MAY CAUSE CONSTIPATION  Water pills (diuretics).   Calcium channel blockers (used to control blood pressure and for the heart).   Certain pain medicines (narcotics).   Anticholinergics.   Anti-inflammatory agents.   Antacids that contain aluminum.  DISEASES THAT CONTRIBUTE TO CONSTIPATION  Diabetes.   Parkinson's disease.   Dementia.   Stroke.   Depression.   Illnesses that cause problems with salt and water metabolism.  HOME CARE INSTRUCTIONS   Constipation is usually best cared for without medicines. Increasing dietary fiber and eating more fruits and vegetables is the best way to manage constipation.   Slowly increase fiber intake to 25 to 38 grams per day. Whole grains, fruits, vegetables, and legumes are good sources of fiber. A dietitian can further help you incorporate high-fiber foods into your diet.   Drink enough water and fluids to keep your urine clear or pale yellow.   A fiber supplement may be added to your diet if you cannot get enough fiber from foods.   Increasing your activities also helps improve regularity.   Suppositories, as suggested by your caregiver, will also help. If you are using antacids, such as aluminum or calcium containing products, it will be helpful to switch to products containing magnesium if your caregiver says it is okay.   If you have been given a liquid injection (enema) today, this is only a temporary measure. It should not be relied on for treatment of  longstanding (chronic) constipation.   Stronger measures, such as magnesium sulfate, should be avoided if possible. This may cause uncontrollable diarrhea. Using magnesium sulfate may not allow you time to make it to the bathroom.  SEEK IMMEDIATE MEDICAL CARE IF:   There is bright red blood in the stool.   The constipation stays for more than 4 days.   There is belly (abdominal) or rectal pain.   You do not seem to be getting better.   You have any questions or concerns.  MAKE SURE YOU:   Understand these instructions.   Will watch your condition.   Will get help right away if you are not doing well or get worse.  Document Released: 10/04/2003 Document Revised: 12/25/2010 Document Reviewed: 12/09/2010 [facility] Patient Information 2012 [place], [place].

## 2011-05-01 NOTE — Assessment & Plan Note (Signed)
Hgb electrophoresis pending.  Anemia panel normal.  Consider hematology referral pending results.

## 2011-05-01 NOTE — Progress Notes (Signed)
Quick Note:  Tried to call pt- NA ______ 

## 2011-05-01 NOTE — Assessment & Plan Note (Signed)
Chronic gastroparesis w/ 1-2 episodes N/V per week.  Due to pylorospasm, not candidate for reglan.

## 2011-05-01 NOTE — Assessment & Plan Note (Signed)
Suspect global slow GI transit.  Add miralax 17 grams daily & colace 100mg  BID.  Previously negative iFOBT.  Call if no relief.

## 2011-05-01 NOTE — Assessment & Plan Note (Signed)
Hx pylorospasm w/ hx GOO s/p Botox injection by Dr Alycia Rossetti.  Improvement since injection.

## 2011-05-01 NOTE — Progress Notes (Signed)
Primary Care Physician:  Syliva Overman, MD, MD Primary Gastroenterologist:  Dr. Jena Gauss  Chief Complaint  Patient presents with  . Follow-up    HPI:  Tracey Daniel is a 31 y.o. female here for follow up for FU hospitalization for acute gastroenteritis.  She has hx of pylorospasm causing GOO s/p botox injection Dec 2012 by Dr Alycia Rossetti.  Also has gastroparesis.  Episodes of vomiting once or twice per week now which is better than before.  Feels "hot & sick" just prior to the episode.  No BM in 2 1/2 weeks.  C/o chronic constipation.  Not taking anything for constipation recently.  Denies rectal bleeding or melena.  Weight stable.   Component     Latest Ref Rng 02/26/2011  Iron     42 - 145 ug/dL 70  UIBC     161 - 096 ug/dL 045  TIBC     409 - 811 ug/dL 914  %SAT     20 - 55 % 21   Component     Latest Ref Rng 02/26/2011  Folate     ng/mL 19.4   Component     Latest Ref Rng 02/26/2011  Ferritin     10 - 291 ng/mL 142    Past Medical History  Diagnosis Date  . Asthma   . Migraines   . Osteoporosis   . Depression   . Seasonal allergies   . Sinusitis   . Back pain, chronic   . Nicotine addiction   . Constipation   . Vitamin d deficiency   . Gastritis   . Gastroparesis   . Pyloric spasm 03/30/2011  . Gastric outlet obstruction     Past Surgical History  Procedure Date  . Cholecystectomy     ?2002  . Laser surgery on cervix   . Carpal tunnel release     left hand  . Esophagogastroduodenoscopy 12/25/2010    [name], MD;  moderate gastritis, ?goo secondary to pylorspasm. BX showed reactive gstropathy no h.pyori, SB mucosa with intramucosal lymphocytosis and partial villous blunting (TTG 4.0 normal)  . Esophageal dilation 12/25/2010    Procedure: ESOPHAGEAL DILATION;  Surgeon: [name], MD;  Location: [facility];  Service: Endoscopy;;    Current Outpatient Prescriptions  Medication Sig Dispense Refill  . albuterol (PROVENTIL HFA;VENTOLIN HFA) 108 (90  BASE) MCG/ACT inhaler Inhale 2 puffs into the lungs every 6 (six) hours as needed. Shortness of breath       . budesonide-formoterol (SYMBICORT) 80-4.5 MCG/ACT inhaler Inhale 2 puffs into the lungs 2 (two) times daily.  1 Inhaler  12  . cyclobenzaprine (FLEXERIL) 10 MG tablet Take 10 mg by mouth 3 (three) times daily as needed. For muscle spasms      . doxycycline (VIBRA-TABS) 100 MG tablet Take 1 tablet (100 mg total) by mouth 2 (two) times daily.  20 tablet  0  . DULoxetine (CYMBALTA) 30 MG capsule Take 1 capsule (30 mg total) by mouth daily.  30 capsule  2  . estradiol (ESTRACE VAGINAL) 0.1 MG/GM vaginal cream Place 2 g vaginally daily. As directed      . estradiol-norethindrone (ACTIVELLA) 1-0.5 MG per tablet Take 1 tablet by mouth daily.      . feeding supplement (ENSURE CLINICAL STRENGTH) LIQD Take 237 mLs by mouth 3 (three) times daily with meals.  10 Bottle  3  . ferrous sulfate 325 (65 FE) MG tablet Take 325 mg by mouth daily with breakfast.      .  flintstones complete (FLINTSTONES) 60 MG chewable tablet Chew 1 tablet by mouth daily.      . fluticasone (FLONASE) 50 MCG/ACT nasal spray Place 2 sprays into the nose daily.  16 g  2  . HYDROcodone-acetaminophen (NORCO) 7.5-325 MG per tablet Take 1 tablet by mouth every 4 (four) hours as needed.       . mupirocin ointment (BACTROBAN) 2 % Apply topically 3 (three) times daily.  22 g  0  . ondansetron (ZOFRAN) 4 MG tablet Take 4 mg by mouth every 8 (eight) hours as needed.      . pantoprazole (PROTONIX) 40 MG tablet Take 1 tablet (40 mg total) by mouth daily.  30 tablet  11  . DISCONTD: albuterol (PROVENTIL HFA;VENTOLIN HFA) 108 (90 BASE) MCG/ACT inhaler Inhale 2 puffs into the lungs every 6 (six) hours as needed for wheezing.  1 Inhaler  2    Allergies as of 05/01/2011 - Review Complete 05/01/2011  Allergen Reaction Noted  . Penicillins    Review of Systems: See HPI, otherwise negative ROS  Physical Exam: BP 114/76  Pulse 66  Temp(Src)  97.5 F (36.4 C) (Temporal)  Ht 5\' 6"  (1.676 m)  Wt 139 lb 9.6 oz (63.322 kg)  BMI 22.53 kg/m2  LMP 01/19/1997 General:   Alert,  Well-developed, well-nourished, pleasant and cooperative in NAD. Eyes:  Sclera clear, no icterus.   Conjunctiva pink. Mouth:  No deformity or lesions, oropharynx pink and moist. Neck:  Supple; no masses or thyromegaly. Heart:  Regular rate and rhythm; no murmurs, clicks, rubs,  or gallops. Abdomen:  Normal bowel sounds.  No bruits.  Soft, non-tender and non-distended without masses, hepatosplenomegaly or hernias noted.  No guarding or rebound tenderness.   Rectal:  No internal/external lesions.  No impaction.  Scant secretions from vault heme negative.  No significant stool burden. Msk:  Symmetrical without gross deformities.  Pulses:  Normal pulses noted. Extremities:  No clubbing or edema. Neurologic:  Alert and oriented x4;  grossly normal neurologically. Skin:  Intact without significant lesions or rashes.

## 2011-05-04 DIAGNOSIS — M5137 Other intervertebral disc degeneration, lumbosacral region: Secondary | ICD-10-CM | POA: Diagnosis not present

## 2011-05-04 DIAGNOSIS — M545 Low back pain: Secondary | ICD-10-CM | POA: Diagnosis not present

## 2011-05-07 ENCOUNTER — Telehealth: Payer: Self-pay | Admitting: Urgent Care

## 2011-05-07 NOTE — Telephone Encounter (Signed)
Pt aware, ok to set up referral to Dr. Mariel Sleet.  Crystal please send referral.

## 2011-05-07 NOTE — Telephone Encounter (Signed)
Please let patient know her lab work for hemoglobin electrophoresis was normal. Needs referral to Dr. Mariel Sleet for anemia Thanks

## 2011-05-07 NOTE — Telephone Encounter (Signed)
REFERRAL FAXED TO DR Mariel Sleet

## 2011-05-08 NOTE — Progress Notes (Signed)
Quick Note:  As per KJ note, agree with referral for hematology consult. ______

## 2011-05-10 NOTE — Assessment & Plan Note (Signed)
Antibiotic prescribed and pt counseled to stop shaving

## 2011-05-10 NOTE — Progress Notes (Signed)
  Subjective:    Patient ID: Tracey Daniel, female    DOB: 12/30/1980, 31 y.o.   MRN: [id number]  HPI 2 day h/o painful cyst on right groin, generally shaves. No drainage from area, no fever or chills   Review of Systems See HPI Denies recent fever or chills. Denies sinus pressure, nasal congestion, ear pain or sore throat. Denies chest congestion, productive cough or wheezing. Denies headaches, seizures, numbness, or tingling. Denies uncontrolled  depression, anxiety or insomnia.         Objective:   Physical Exam Patient alert and oriented and in no cardiopulmonary distress.  HEENT: No facial asymmetry, EOMI, no sinus tenderness,  oropharynx pink and moist.  Neck supple no adenopathy.  Chest: decreased air entry CVS: S1, S2 no murmurs, no S3.  ABD: Soft non tender. Bowel sounds normal.  Ext: No edema  Skin: infected cyst in right groin  Psych: Good eye contact, normal affect. Memory intact not anxious or depressed appearing.         Assessment & Plan:

## 2011-05-12 DIAGNOSIS — IMO0002 Reserved for concepts with insufficient information to code with codable children: Secondary | ICD-10-CM | POA: Diagnosis not present

## 2011-05-12 DIAGNOSIS — N949 Unspecified condition associated with female genital organs and menstrual cycle: Secondary | ICD-10-CM | POA: Diagnosis not present

## 2011-05-21 ENCOUNTER — Emergency Department (HOSPITAL_COMMUNITY)
Admission: EM | Admit: 2011-05-21 | Discharge: 2011-05-21 | Disposition: A | Discharge status: Home / Self Care | Payer: Medicare Other | Attending: Emergency Medicine | Admitting: Emergency Medicine

## 2011-05-21 ENCOUNTER — Encounter (HOSPITAL_COMMUNITY): Payer: Self-pay | Admitting: Emergency Medicine

## 2011-05-21 ENCOUNTER — Emergency Department (HOSPITAL_COMMUNITY): Payer: Medicare Other

## 2011-05-21 DIAGNOSIS — R0609 Other forms of dyspnea: Secondary | ICD-10-CM

## 2011-05-21 DIAGNOSIS — J45909 Unspecified asthma, uncomplicated: Secondary | ICD-10-CM | POA: Insufficient documentation

## 2011-05-21 DIAGNOSIS — R079 Chest pain, unspecified: Secondary | ICD-10-CM | POA: Diagnosis not present

## 2011-05-21 DIAGNOSIS — R1110 Vomiting, unspecified: Secondary | ICD-10-CM | POA: Diagnosis not present

## 2011-05-21 DIAGNOSIS — R0602 Shortness of breath: Secondary | ICD-10-CM | POA: Diagnosis not present

## 2011-05-21 DIAGNOSIS — R0689 Other abnormalities of breathing: Secondary | ICD-10-CM

## 2011-05-21 DIAGNOSIS — R064 Hyperventilation: Secondary | ICD-10-CM | POA: Diagnosis not present

## 2011-05-21 DIAGNOSIS — K3184 Gastroparesis: Secondary | ICD-10-CM | POA: Diagnosis not present

## 2011-05-21 DIAGNOSIS — R259 Unspecified abnormal involuntary movements: Secondary | ICD-10-CM | POA: Diagnosis not present

## 2011-05-21 DIAGNOSIS — R0989 Other specified symptoms and signs involving the circulatory and respiratory systems: Secondary | ICD-10-CM | POA: Diagnosis not present

## 2011-05-21 DIAGNOSIS — K279 Peptic ulcer, site unspecified, unspecified as acute or chronic, without hemorrhage or perforation: Secondary | ICD-10-CM | POA: Insufficient documentation

## 2011-05-21 HISTORY — DX: Peptic ulcer, site unspecified, unspecified as acute or chronic, without hemorrhage or perforation: K27.9

## 2011-05-21 LAB — URINALYSIS, ROUTINE W REFLEX MICROSCOPIC
Bilirubin Urine: NEGATIVE
Hgb urine dipstick: NEGATIVE
Nitrite: NEGATIVE
Specific Gravity, Urine: 1.02 (ref 1.005–1.030)
pH: 8.5 — ABNORMAL HIGH (ref 5.0–8.0)

## 2011-05-21 LAB — RAPID URINE DRUG SCREEN, HOSP PERFORMED
Amphetamines: NOT DETECTED
Cocaine: NOT DETECTED
Opiates: NOT DETECTED
Tetrahydrocannabinol: POSITIVE — AB

## 2011-05-21 LAB — POCT I-STAT, CHEM 8
Calcium, Ion: 1.15 mmol/L (ref 1.12–1.32)
Chloride: 105 mEq/L (ref 96–112)
Glucose, Bld: 123 mg/dL — ABNORMAL HIGH (ref 70–99)
HCT: 36 % (ref 36.0–46.0)

## 2011-05-21 MED ORDER — ONDANSETRON HCL 4 MG/2ML IJ SOLN
INTRAMUSCULAR | Status: AC
  Filled 2011-05-21: qty 2

## 2011-05-21 MED ORDER — ONDANSETRON HCL 4 MG/2ML IJ SOLN
4.0000 mg | Freq: Once | INTRAMUSCULAR | Status: AC
  Administered 2011-05-21: 4 mg via INTRAVENOUS

## 2011-05-21 MED ORDER — LORAZEPAM 2 MG/ML IJ SOLN
1.0000 mg | Freq: Once | INTRAMUSCULAR | Status: AC
  Administered 2011-05-21: 1 mg via INTRAVENOUS
  Filled 2011-05-21: qty 1

## 2011-05-21 MED ORDER — ONDANSETRON HCL 4 MG/2ML IJ SOLN
4.0000 mg | Freq: Once | INTRAMUSCULAR | Status: AC
  Administered 2011-05-21: 4 mg via INTRAVENOUS
  Filled 2011-05-21: qty 2

## 2011-05-21 MED ORDER — HYDROMORPHONE HCL PF 1 MG/ML IJ SOLN
1.0000 mg | Freq: Once | INTRAMUSCULAR | Status: AC
  Administered 2011-05-21: 1 mg via INTRAVENOUS
  Filled 2011-05-21: qty 1

## 2011-05-21 NOTE — ED Notes (Signed)
Family has arrived to [facility].

## 2011-05-21 NOTE — ED Notes (Signed)
Calmer, NAD, interactive, up to b/r via w/c and EMT.

## 2011-05-21 NOTE — ED Notes (Signed)
C/o pain and nausea returning, EDP aware, med orders received and initiated.

## 2011-05-21 NOTE — ED Notes (Signed)
PT. REPORTS VOMITTING AND DIARRHEA ONSET THIS EVENING AFTER EATING A SUB SANDWICH , UPPER ABDOMINAL PAIN , VOMITTING AT TRIAGE.

## 2011-05-21 NOTE — ED Provider Notes (Signed)
History     CSN: [id number]  Arrival date & time 05/21/11  0051   First MD Initiated Contact with Patient 05/21/11 0113      Chief Complaint  Patient presents with  . Emesis    (Consider location/radiation/quality/duration/timing/severity/associated sxs/prior treatment) HPI Comments: Tracey Daniel is a 31 y.o. female who states that she has had chest pain and vomiting. After eating a sandwich this evening. She had a Botox injection in 2012 for chronic swallowing problems. She denies recent fever or chills. She is seeing her GI doctor regularly. She was admitted about 8 weeks ago for gastroenteritis. She has persistent, intermittent vomiting, associated with her gastroparesis.    Patient is a 31 y.o. female presenting with vomiting. The history is provided by the patient.  Emesis     Past Medical History  Diagnosis Date  . Asthma   . Migraines   . Osteoporosis   . Depression   . Seasonal allergies   . Sinusitis   . Back pain, chronic   . Nicotine addiction   . Constipation   . Vitamin d deficiency   . Gastritis   . Gastroparesis   . Pyloric spasm 03/30/2011  . Gastric outlet obstruction   . Peptic ulcer disease     Past Surgical History  Procedure Date  . Cholecystectomy     ?2002  . Laser surgery on cervix   . Carpal tunnel release     left hand  . Esophagogastroduodenoscopy 12/25/2010    [name], MD;  moderate gastritis, ?goo secondary to pylorspasm. BX showed reactive gstropathy no h.pyori, SB mucosa with intramucosal lymphocytosis and partial villous blunting (TTG 4.0 normal)  . Esophageal dilation 12/25/2010    Procedure: ESOPHAGEAL DILATION;  Surgeon: [name], MD;  Location: [facility];  Service: Endoscopy;;    Family History  Problem Relation Age of Onset  . Diabetes Father   . Liver disease Father     liver transplant at [facility] [address], age 64  . Colon cancer Neg Hx   . Lung cancer Mother     History  Substance Use Topics  . Smoking  status: Former Smoker -- 1.0 packs/day for 15 years    Types: Cigarettes  . Smokeless tobacco: Not on file   Comment: quit 2011  . Alcohol Use: No     none since July. Periodic heavy drinker for months at a time.    OB History    Grav Para Term Preterm Abortions TAB SAB Ect Mult Living                  Review of Systems  Gastrointestinal: Positive for vomiting.  All other systems reviewed and are negative.    Allergies  Penicillins  Home Medications   Current Outpatient Rx  Name Route Sig Dispense Refill  . ALBUTEROL SULFATE HFA 108 (90 BASE) MCG/ACT IN AERS Inhalation Inhale 2 puffs into the lungs every 6 (six) hours as needed. Shortness of breath     . BUDESONIDE-FORMOTEROL FUMARATE 80-4.5 MCG/ACT IN AERO Inhalation Inhale 2 puffs into the lungs 2 (two) times daily. 1 Inhaler 12  . CYCLOBENZAPRINE HCL 10 MG PO TABS Oral Take 10 mg by mouth 3 (three) times daily as needed. For muscle spasms    . DULOXETINE HCL 30 MG PO CPEP Oral Take 1 capsule (30 mg total) by mouth daily. 30 capsule 2  . ESTRADIOL 0.1 MG/GM VA CREA Vaginal Place 2 g vaginally daily. As directed    .  ESTRADIOL-NORETHINDRONE ACET 1-0.5 MG PO TABS Oral Take 1 tablet by mouth daily.    Marland Kitchen ENSURE CLINICAL ST REVIGOR PO LIQD Oral Take 237 mLs by mouth 3 (three) times daily with meals. 10 Bottle 3  . FERROUS SULFATE 325 (65 FE) MG PO TABS Oral Take 325 mg by mouth daily with breakfast.    . FLINTSTONES COMPLETE 60 MG PO CHEW Oral Chew 1 tablet by mouth daily.    Marland Kitchen FLUTICASONE PROPIONATE 50 MCG/ACT NA SUSP Nasal Place 2 sprays into the nose daily. 16 g 2  . HYDROCODONE-ACETAMINOPHEN 7.5-325 MG PO TABS Oral Take 1 tablet by mouth every 4 (four) hours as needed.     Marland Kitchen ONDANSETRON HCL 4 MG PO TABS Oral Take 4 mg by mouth every 8 (eight) hours as needed.    Marland Kitchen PANTOPRAZOLE SODIUM 40 MG PO TBEC Oral Take 1 tablet (40 mg total) by mouth daily. 30 tablet 11    BP 109/51  Pulse 69  Temp(Src) 97.9 F (36.6 C) (Oral)   Resp 14  SpO2 98%  LMP 01/19/1997  Physical Exam  Nursing note and vitals reviewed. Constitutional: She is oriented to person, place, and time. She appears well-developed and well-nourished.  HENT:  Head: Normocephalic and atraumatic.  Eyes: Conjunctivae and EOM are normal. Pupils are equal, round, and reactive to light.  Neck: Normal range of motion and phonation normal. Neck supple.  Cardiovascular: Normal rate, regular rhythm and intact distal pulses.   Pulmonary/Chest: Effort normal. She exhibits no tenderness.       Hyperventilating  Abdominal: Soft. She exhibits no distension. There is no tenderness. There is no guarding.  Musculoskeletal: Normal range of motion.  Neurological: She is alert and oriented to person, place, and time. She has normal strength. No cranial nerve deficit. She exhibits normal muscle tone.       She has carpal pedal spasm  Skin: Skin is warm and dry.  Psychiatric: Her behavior is normal.       She is anxious    ED Course  Procedures (including critical care time)  Initial treatment, IV fluids, Ativan, Dilaudid, and Zofran.  6:46 AM Reevaluation with update and discussion. After initial assessment and treatment, an updated evaluation reveals She is much better now, able to give hx. Tracey Daniel    Labs Reviewed  URINALYSIS, ROUTINE W REFLEX MICROSCOPIC - Abnormal; Notable for the following:    pH 8.5 (*)    Ketones, ur 15 (*)    Protein, ur 100 (*)    All other components within normal limits  URINE RAPID DRUG SCREEN (HOSP PERFORMED) - Abnormal; Notable for the following:    Tetrahydrocannabinol POSITIVE (*) REPEATED TO VERIFY   All other components within normal limits  POCT I-STAT, CHEM 8 - Abnormal; Notable for the following:    Potassium 3.2 (*)    Glucose, Bld 123 (*)    All other components within normal limits  ETHANOL  URINE MICROSCOPIC-ADD ON   Dg Chest Port 1 View  05/21/2011  *RADIOLOGY REPORT*  Clinical Data: Shortness of  breath  PORTABLE CHEST - 1 VIEW  Comparison: 02/08/2009  Findings: Lungs are clear. No pleural effusion or pneumothorax. The cardiomediastinal contours are within normal limits. The visualized bones and soft tissues are without significant appreciable abnormality.  IMPRESSION: No acute cardiopulmonary process.  Original Report Authenticated By: Waneta Martins, M.D.     1. Vomiting   2. Gastroparesis       MDM  Recurrent vomiting,  and abdominal pain. Doubt acute bowel obstruction, worsening. Pyloric stenosis outlet syndrome, metabolic instability or occult infection..  Patient stable for discharge..  Plan: Home Medications- usual; Home Treatments- gradually advance diet; Recommended follow up- PCP prn        Flint Melter, MD 05/21/11 [phone]

## 2011-05-21 NOTE — Discharge Instructions (Signed)
See your Dr. as needed for problems.   Gastroparesis  Gastroparesis is also called slowed stomach emptying (delayed gastric emptying). It is a condition in which the stomach takes too long to empty its contents. It often happens in people with diabetes.  CAUSES  Gastroparesis happens when nerves to the stomach are damaged or stop working. When the nerves are damaged, the muscles of the stomach and intestines do not work normally. The movement of food is slowed or stopped. High blood glucose (sugar) causes changes in nerves and can damage the blood vessels that carry oxygen and nutrients to the nerves. RISK FACTORS  Diabetes.   Post-viral syndromes.   Eating disorders (anorexia, bulimia).   Surgery on the stomach or vagus nerve.   Gastroesophageal reflux disease (rarely).   Smooth muscle disorders (amyloidosis, scleroderma).   Metabolic disorders, including hypothyroidism.   Parkinson's disease.  SYMPTOMS   Heartburn.   Feeling sick to your stomach (nausea).   Vomiting of undigested food.   An early feeling of fullness when eating.   Weight loss.   Abdominal bloating.   Erratic blood glucose levels.   Lack of appetite.   Gastroesophageal reflux.   Spasms of the stomach wall.  Complications can include:  Bacterial overgrowth in stomach. Food stays in the stomach and can ferment and cause bacteria to grow.   Weight loss due to difficulty digesting and absorbing nutrients.   Vomiting.   Obstruction in the stomach. Undigested food can harden and cause nausea and vomiting.   Blood glucose fluctuations caused by inconsistent food absorption.  DIAGNOSIS  The diagnosis of gastroparesis is confirmed through one or more of the following tests:  Barium X-rays and scans. These tests look at how long it takes for food to move through the stomach.   Gastric manometry. This test measures electrical and muscular activity in the stomach. A thin tube is passed down the  throat into the stomach. The tube contains a wire that takes measurements of the stomach's electrical and muscular activity as it digests liquids and solid food.   Endoscopy. This procedure is done with a long, thin tube called an endoscope. It is passed through the mouth and gently guides down the esophagus into the stomach. This tube helps the caregiver look at the lining of the stomach to check for any abnormalities.   Ultrasound. This can rule out gallbladder disease or pancreatitis. This test will outline and define the shape of the gallbladder and pancreas.  TREATMENT   The primary treatment is to identify the problem and help control blood glucose levels. Treatments include:   Exercise.   Medicines to control nausea and vomiting.   Medicines to stimulate stomach muscles.   Changes in what and when you eat.   Having smaller meals more often.   Eating low-fiber forms of high-fiber foods, such aseating cooked vegetables instead of raw vegetables.   Eating low-fat foods.   Consuming liquids, which are easier to digest.   In severe cases, feeding tubes and intravenous (IV) feeding may be needed.  It is important to note that in most cases, treatment does not cure gastroparesis. It is usually a lasting (chronic) condition. Treatment helps you manage the condition so that you can be as healthy and comfortable as possible. NEW TREATMENTS  A gastric neurostimulator has been developed to assist people with gastroparesis. The battery-operated device is surgically implanted. It emits mild electrical pulses to help improve stomach emptying and to control nausea and vomiting.  The use of botulinum toxin has been shown to improve stomach emptying by decreasing the prolonged contractions of the muscle between the stomach and the small intestine (pyloric sphincter). The benefits are temporary.  SEEK MEDICAL CARE IF:   You are having problems keeping your blood glucose in goal range.   You  are having nausea, vomiting, bloating, or early feelings of fullness with eating.   Your symptoms do not change with a change in diet.  Document Released: 01/05/2005 Document Revised: 12/25/2010 Document Reviewed: 06/14/2008 [facility] Patient Information 2012 [place], [place].

## 2011-05-21 NOTE — ED Notes (Signed)
Pain med given, waiting for results/improvement, alert, NAD, calm, interactive.

## 2011-05-21 NOTE — ED Notes (Signed)
Pt calmer, "feels a little better", rates abd pain 7/10, also mentions "back of chest hurts from vomiting", denies other sx. Nausea resolved. Denies sob. Appears sedated/lethargic. Resting with eyes closed, speech sluggish, responses delayed and slow, arouses to voice, interactive & appropriate. VSS/WNL.

## 2011-05-22 DIAGNOSIS — R0689 Other abnormalities of breathing: Secondary | ICD-10-CM

## 2011-06-01 DIAGNOSIS — M5137 Other intervertebral disc degeneration, lumbosacral region: Secondary | ICD-10-CM | POA: Diagnosis not present

## 2011-06-01 DIAGNOSIS — M545 Low back pain: Secondary | ICD-10-CM | POA: Diagnosis not present

## 2011-06-04 ENCOUNTER — Telehealth: Payer: Self-pay | Admitting: Urgent Care

## 2011-06-04 NOTE — Telephone Encounter (Signed)
Pt never heard anything from Dr. Thornton Papas office. I call them and they stated that they did not receive any paper work. I asked Crystal and she did fax it to them but she re-faxed it to them again. Pt aware that they will call her if she has not heard anything in a week she will call me back

## 2011-06-04 NOTE — Telephone Encounter (Signed)
Please call to FU w/ referral to hematology w/ Dr Mariel Sleet. Did pt decide not to pursue? Thanks

## 2011-06-05 NOTE — Telephone Encounter (Signed)
Thanks

## 2011-06-19 ENCOUNTER — Encounter (HOSPITAL_COMMUNITY): Payer: Medicare Other | Attending: Oncology | Admitting: Oncology

## 2011-06-19 VITALS — BP 115/74 | HR 52 | Temp 98.1°F | Ht 66.0 in | Wt 135.0 lb

## 2011-06-19 DIAGNOSIS — D649 Anemia, unspecified: Secondary | ICD-10-CM

## 2011-06-19 DIAGNOSIS — Z860100 Personal history of colon polyps, unspecified: Secondary | ICD-10-CM | POA: Insufficient documentation

## 2011-06-19 DIAGNOSIS — G43909 Migraine, unspecified, not intractable, without status migrainosus: Secondary | ICD-10-CM | POA: Insufficient documentation

## 2011-06-19 DIAGNOSIS — R197 Diarrhea, unspecified: Secondary | ICD-10-CM | POA: Insufficient documentation

## 2011-06-19 DIAGNOSIS — Z720 Tobacco use: Secondary | ICD-10-CM

## 2011-06-19 DIAGNOSIS — F17200 Nicotine dependence, unspecified, uncomplicated: Secondary | ICD-10-CM | POA: Insufficient documentation

## 2011-06-19 DIAGNOSIS — K5900 Constipation, unspecified: Secondary | ICD-10-CM | POA: Insufficient documentation

## 2011-06-19 DIAGNOSIS — G8929 Other chronic pain: Secondary | ICD-10-CM | POA: Insufficient documentation

## 2011-06-19 DIAGNOSIS — N912 Amenorrhea, unspecified: Secondary | ICD-10-CM | POA: Diagnosis not present

## 2011-06-19 DIAGNOSIS — K219 Gastro-esophageal reflux disease without esophagitis: Secondary | ICD-10-CM | POA: Insufficient documentation

## 2011-06-19 DIAGNOSIS — F1210 Cannabis abuse, uncomplicated: Secondary | ICD-10-CM | POA: Insufficient documentation

## 2011-06-19 DIAGNOSIS — J45909 Unspecified asthma, uncomplicated: Secondary | ICD-10-CM | POA: Insufficient documentation

## 2011-06-19 DIAGNOSIS — M549 Dorsalgia, unspecified: Secondary | ICD-10-CM | POA: Insufficient documentation

## 2011-06-19 DIAGNOSIS — D696 Thrombocytopenia, unspecified: Secondary | ICD-10-CM | POA: Diagnosis not present

## 2011-06-19 LAB — COMPREHENSIVE METABOLIC PANEL
BUN: 14 mg/dL (ref 6–23)
Calcium: 10.3 mg/dL (ref 8.4–10.5)
Creatinine, Ser: 0.71 mg/dL (ref 0.50–1.10)
GFR calc Af Amer: >90 mL/min (ref >90)
GFR calc non Af Amer: >90 mL/min (ref >90)
Glucose, Bld: 84 mg/dL (ref 70–99)
Sodium: 137 mEq/L (ref 135–145)
Total Protein: 7.7 g/dL (ref 6.0–8.3)

## 2011-06-19 LAB — IRON AND TIBC
Iron: 91 ug/dL (ref 42–135)
Saturation Ratios: 32 % (ref 20–55)
TIBC: 285 ug/dL (ref 250–470)
UIBC: 194 ug/dL (ref 125–400)

## 2011-06-19 LAB — DIFFERENTIAL
Eosinophils Absolute: 0.1 10*3/uL (ref 0.0–0.7)
Eosinophils Relative: 1 % (ref 0–5)
Lymphs Abs: 2.8 10*3/uL (ref 0.7–4.0)
Monocytes Absolute: 0.4 10*3/uL (ref 0.1–1.0)
Monocytes Relative: 6 % (ref 3–12)

## 2011-06-19 LAB — CBC
HCT: 33.8 % — ABNORMAL LOW (ref 36.0–46.0)
MCH: 29.3 pg (ref 26.0–34.0)
MCV: 87.6 fL (ref 78.0–100.0)
Platelets: 164 10*3/uL (ref 150–400)
RBC: 3.86 MIL/uL — ABNORMAL LOW (ref 3.87–5.11)

## 2011-06-19 NOTE — Patient Instructions (Signed)
Tracey Daniel  [id number] [birth year] Dr. Glenford Peers   [facility]  Discharge Instructions  RECOMMENDATIONS MADE BY THE CONSULTANT AND ANY TEST RESULTS WILL BE SENT TO YOUR REFERRING DOCTOR.   EXAM FINDINGS BY MD TODAY AND SIGNS AND SYMPTOMS TO REPORT TO CLINIC OR PRIMARY MD: Exam and discussion per PA.  We will check some blood work and get a CT scan of your chest.    MEDICATIONS PRESCRIBED: none     SPECIAL INSTRUCTIONS/FOLLOW-UP: Lab work Needed today, Xray Studies Needed within the next 3 weeks and Return to Clinic in 4 - 6 weeks to see MD.   I acknowledge that I have been informed and understand all the instructions given to me and received a copy. I do not have any more questions at this time, but understand that I may call the Specialty Clinic at [facility] at [phone] during business hours should I have any further questions or need assistance in obtaining follow-up care.    __________________________________________  _____________  __________ Signature of Patient or Authorized Representative            Date                   Time    __________________________________________ Nurse's Signature

## 2011-06-19 NOTE — Progress Notes (Signed)
Tracey Daniel presented for labwork. Labs per MD order drawn via Peripheral Line 23 gauge needle inserted in right AC  Good blood return present. Procedure without incident.  Needle removed intact. Patient tolerated procedure well.

## 2011-06-19 NOTE — Progress Notes (Signed)
[facility] NEW PATIENT EVALUATION   Name: Tracey Daniel Date: 06/19/2011 MRN: [id number] DOB: Mar 26, 1980    CC: Tracey Overman, MD, MD  Tracey Perches, MD   DIAGNOSIS: The primary encounter diagnosis was Anemia. Diagnoses of Thrombocytopenia, Tobacco abuse, and AMENORRHEA, SECONDARY were also pertinent to this visit.   HISTORY OF PRESENT ILLNESS:Tracey Daniel is a 31 y.o. African American female who is referred to the [facility] for anemia. She has a past medical history significant for depression, asthma, secondary amenorrhea, chronic back pain, GERD, chronic diarrhea, gastroparesis, and abdominal pain.  The patient reports that she is on disability due to back pain and slow learning for quite some time. She also suffers from pylorospasm requiring Botox injection in December 2012 by Dr. Alycia Rossetti.  She has been on Cymbalta for approximately 10 years. After reviewing her medication list, this is your medicine that may have an interference with her anemia and/or thrombocytopenia.  The patient reports that she was tested for hepatitis HIV approximately 1/2 years ago. It is noted that she has a tattoo on the left side of her neck that reads "pebbles". She explains that she had a tattoo prior to this hepatitis HIV test. She does admit to having unprotected sexual intercourse with her fianc. She reports that she is in a monogamous relationship.  The patient reports that she eats a regular diet. She is not a begin or vegetarian.  Patient reports that she stopped having her menses at the age of 48. She has no initiating factor for this. She reports that she does not have a diagnosis or a cause for this secondary amenorrhea. She reports that she has seen a gynecologist and he reports that her ovaries are shrinking.  The patient admits to occasional "sticky" stool, constipation, and night sweats which occasionally cause her to change her pillow she then closed  at that time. She reports that her fianc try to avoid her when her sleeping at night due to her "heat."  The patient denies any headaches, dizziness, double vision, fevers, chills, nausea, vomiting, diarrhea, chest pain, heart palpitations, blood in stool, black tarry stool, urinary pain, urinary burning, urinary frequency, hematuria.  The patient admits to increased facial hair. She does not reports that her facial or head habitus has changed.   FAMILY HISTORY: family history includes Diabetes in her father; Liver disease in her father; and Lung cancer in her mother.  There is no history of Colon cancer.  The patient reports that her mother passed away in her 72s do to a murmur. She was also diagnosed with lung cancer from smoking. Her father is 98 years old and he is status post a liver transplant and has diabetes and heart disease. She has 2 biological sisters who are 49 years old and 23 years old. A 27 year old sister is healthy, but the 47 year old sister is on disability for ADHD and asthma. She has another sister who is 12 years old who has MS. She has 2 healthy brothers who are 41 and 58 years old. She denies a family history of anemia.   PAST MEDICAL HISTORY:  has a past medical history of Asthma; Migraines; Osteoporosis; Depression; Seasonal allergies; Sinusitis; Back pain, chronic; Nicotine addiction; Constipation; Vitamin d deficiency; Gastritis; Gastroparesis; Pyloric spasm (03/30/2011); Gastric outlet obstruction; and Peptic ulcer disease.       CURRENT MEDICATIONS: See medication list in [facility].   SOCIAL HISTORY:  reports that she has quit smoking. Her smoking  use included Cigarettes. She has a 15 pack-year smoking history. She does not have any smokeless tobacco history on file. She reports that she does not drink alcohol or use illicit drugs.  The patient was born [place] and presently resides in [place], [place]. She completed ninth grade of high school. She  used to work at [employer] but is now on disability for her chronic low back pain and slow learning. She is engaged for 2 years. She lives with her fianc, mother-in-law, and knees at home. She denies any alcohol abuse. She does admit to a 3 pack per day smoking history for 15 years quitting approximately 2 years ago. Initially date she denies illicit drug abuse, but she tested positive in May of 2013 for marijuana. She does now admitted to marijuana and she smokes approximately 2 joints per week.   GYN HISTORY: The patient reports that she started menarche at the age of 15 or 24. She quit having menses at the age of 40. She is G0 P0. She does get her Pap smears.it so far she reports she has not had any issues.  She does admit to increased facial hair appear.   ALLERGIES: Penicillins which causes a rash   LABORATORY DATA:   Results for Tracey, Daniel (MRN [id number]) as of 06/19/2011 12:57  Ref. Range 12/25/2010 08:10 12/26/2010 04:56 12/31/2010 05:06 01/30/2011 11:48 03/25/2011 16:53 03/26/2011 05:16 03/28/2011 06:14 04/27/2011 12:30 05/21/2011 01:50  WBC Latest Range: 4.0-10.5 K/uL 5.7 4.6 6.0 9.7 19.1 (H) 12.5 (H) 9.9 6.6   RBC Latest Range: 3.87-5.11 MIL/uL 3.24 (L) 3.47 (L) 4.57 3.68 (L) 4.39 3.79 (L) 3.78 (L) 3.55 (L)   Hemoglobin Latest Range: 12.0-15.0 g/dL 9.8 (L) 57.8 (L) 46.9 62.9 (L) 13.2 11.2 (L) 11.3 (L) 10.2 (L) 12.2  HCT Latest Range: 36.0-46.0 % 29.1 (L) 31.3 (L) 41.0 33.4 (L) 38.9 33.3 (L) 33.4 (L) 32.6 (L) 36.0  MCV Latest Range: 78.0-100.0 fL 89.8 90.2 89.7 90.8 88.6 87.9 88.4 91.8   MCH Latest Range: 26.0-34.0 pg 30.2 30.0 29.8 29.3 30.1 29.6 29.9 28.7   MCHC Latest Range: 30.0-36.0 g/dL 52.8 41.3 24.4 01.0 27.2 33.6 33.8 31.3   RDW Latest Range: 11.5-15.5 % 13.4 13.5 12.9 13.6 13.1 13.2 12.8 12.6   Platelets Latest Range: 150-400 K/uL 116 (L) 141 (L) 240 209 202 178 155 134 (L)     RADIOGRAPHY: 03/29/2011  *RADIOLOGY REPORT*  Clinical Data: Nausea, vomiting and abdominal pain.  History of  colitis.  CT ABDOMEN AND PELVIS WITH CONTRAST  Technique: Multidetector CT imaging of the abdomen and pelvis was  performed following the standard protocol during bolus  administration of intravenous contrast.  Contrast: OMNIPAQUE IOHEXOL 300 MG/ML IJ SOLN  Comparison: CT of abdomen and pelvis 03/25/2011.  Findings:  Lung Bases: Unremarkable.  Abdomen/Pelvis: Status post cholecystectomy. Focal area of  decreased attenuation adjacent the falciform ligament is most  consistent with a perfusion anomaly. 2 mm low attenuation lesion in  segment eight of the liver is unchanged compared to numerous prior  examinations, and although too small to characterize is likely a  benign lesion such as a cyst. The remainder of the liver is  otherwise unremarkable in appearance. The enhanced appearance of  the pancreas, spleen, bilateral adrenal glands and right kidney is  unremarkable. A 7 mm intermediate attenuation (22 HU) lesion in the  left kidney is unchanged in retrospect compared to prior  examinations, but it is too small to characterize. Normal appendix  (  retrocecal). There is no ascites or pneumoperitoneum and no  pathologic distension of bowel. No pathologic adenopathy  appreciated within the abdomen or pelvis on today's examination.  No definite colonic wall thickening is appreciated on today's  examination to strongly suggest the presence of colitis.  Musculoskeletal: There are no aggressive appearing lytic or blastic  lesions noted in the visualized portions of the skeleton.  IMPRESSION:  1. No definite acute findings in the abdomen or pelvis on today's  examination to account for the patient's symptoms.  2. Previously noted colonic wall thickening is no longer clearly  evident. This finding may have been related to under distension of  the colon on the prior examination. No definite signs of colitis  are noted on today's examination.  3. Status post cholecystectomy.    4. 7 mm intermediate attenuation lesion in the left kidney is too  small to characterize, however, this finding is similar in  comparison to numerous prior examinations dating back to at least  2010, and it is therefore favored to represent a small cyst.  Original Report Authenticated By: Florencia Reasons, M.D.   PHYSICAL EXAM:  height is 5\' 6"  (1.676 m) and weight is 135 lb (61.236 kg). Her oral temperature is 98.1 F (36.7 C). Her blood pressure is 115/74 and her pulse is 52.  General appearance: alert, cooperative, appears older than stated age, no distress  Head: Normocephalic, without obvious abnormality, atraumatic, nearly female in appearance with female-like features Neck: no adenopathy, supple, symmetrical, trachea midline and thyroid not enlarged, symmetric, no tenderness/mass/nodules Lymph nodes: Cervical, supraclavicular, and axillary nodes normal. Resp: clear to auscultation bilaterally, normal percussion bilaterally and Decreased breath sounds throughout Cardio: regular rate and rhythm, S1, S2 normal, no murmur, click, rub or gallop GI: soft, non-tender; bowel sounds normal; no masses,  no organomegaly Extremities: extremities normal, atraumatic, no cyanosis or edema Neurologic: Alert and oriented X 3, normal strength and tone. Normal symmetric reflexes. Normal coordination and gait     IMPRESSION:  1. Intermittent anemia 2. Intermittent thrombocytopenia 3. Secondary amenorrhea, unknown etiology 4. Tobacco abuse, >45 pack year smoking history   PLAN:  1. Referral to endocrinology for secondary amenorrhea since age of 74, evaluate for pituitary insufficiency, "shrinking ovaries." 2. Low-dose spiral ST of chest without contrast to evaluate for occult malignancy in light of her greater than 45 year pack history of tobacco abuse. 3. Lab work today: CBC diff, CMET, B12, Iron/TIBC, Ferritin, peripheral blood smear, HIV antibody, Hepatitis panel, serum copper, TSH. 4. Return in  4-6 weeks for follow-up  Patient and plan discussed with Dr. Glenford Peers and he is in agreement with the aforementioned. Patient seen and examined by Dr. Glenford Peers as well.  All questions were answered. The patient knows to call the clinic with any problems, questions, or concerns.  Shaneequa Bahner

## 2011-06-19 NOTE — Progress Notes (Signed)
Addended by: Sterling Big on: 06/19/2011 01:14 PM   Modules accepted: Orders

## 2011-06-22 ENCOUNTER — Encounter (HOSPITAL_COMMUNITY): Payer: Self-pay | Admitting: Emergency Medicine

## 2011-06-22 ENCOUNTER — Emergency Department (HOSPITAL_COMMUNITY)
Admission: EM | Admit: 2011-06-22 | Discharge: 2011-06-22 | Disposition: A | Discharge status: Home / Self Care | Payer: Medicare Other | Attending: Emergency Medicine | Admitting: Emergency Medicine

## 2011-06-22 ENCOUNTER — Telehealth: Payer: Self-pay | Admitting: Gastroenterology

## 2011-06-22 ENCOUNTER — Emergency Department (HOSPITAL_COMMUNITY): Payer: Medicare Other

## 2011-06-22 DIAGNOSIS — S93409A Sprain of unspecified ligament of unspecified ankle, initial encounter: Secondary | ICD-10-CM | POA: Insufficient documentation

## 2011-06-22 DIAGNOSIS — J45909 Unspecified asthma, uncomplicated: Secondary | ICD-10-CM | POA: Insufficient documentation

## 2011-06-22 DIAGNOSIS — Z9109 Other allergy status, other than to drugs and biological substances: Secondary | ICD-10-CM | POA: Insufficient documentation

## 2011-06-22 DIAGNOSIS — S8990XA Unspecified injury of unspecified lower leg, initial encounter: Secondary | ICD-10-CM | POA: Diagnosis not present

## 2011-06-22 DIAGNOSIS — Z79899 Other long term (current) drug therapy: Secondary | ICD-10-CM | POA: Insufficient documentation

## 2011-06-22 DIAGNOSIS — W19XXXA Unspecified fall, initial encounter: Secondary | ICD-10-CM

## 2011-06-22 DIAGNOSIS — M810 Age-related osteoporosis without current pathological fracture: Secondary | ICD-10-CM | POA: Insufficient documentation

## 2011-06-22 DIAGNOSIS — Y92009 Unspecified place in unspecified non-institutional (private) residence as the place of occurrence of the external cause: Secondary | ICD-10-CM | POA: Insufficient documentation

## 2011-06-22 DIAGNOSIS — W108XXA Fall (on) (from) other stairs and steps, initial encounter: Secondary | ICD-10-CM | POA: Insufficient documentation

## 2011-06-22 DIAGNOSIS — F3289 Other specified depressive episodes: Secondary | ICD-10-CM | POA: Insufficient documentation

## 2011-06-22 DIAGNOSIS — S99929A Unspecified injury of unspecified foot, initial encounter: Secondary | ICD-10-CM | POA: Diagnosis not present

## 2011-06-22 DIAGNOSIS — G8929 Other chronic pain: Secondary | ICD-10-CM | POA: Insufficient documentation

## 2011-06-22 DIAGNOSIS — F329 Major depressive disorder, single episode, unspecified: Secondary | ICD-10-CM | POA: Insufficient documentation

## 2011-06-22 LAB — HEPATITIS PANEL, ACUTE
HCV Ab: NEGATIVE
Hep A IgM: NEGATIVE
Hepatitis B Surface Ag: NEGATIVE

## 2011-06-22 MED ORDER — HYDROCODONE-ACETAMINOPHEN 5-325 MG PO TABS
2.0000 | ORAL_TABLET | Freq: Once | ORAL | Status: AC
  Administered 2011-06-22: 2 via ORAL
  Filled 2011-06-22: qty 2

## 2011-06-22 MED ORDER — IBUPROFEN 800 MG PO TABS
800.0000 mg | ORAL_TABLET | Freq: Once | ORAL | Status: AC
  Administered 2011-06-22: 800 mg via ORAL
  Filled 2011-06-22: qty 1

## 2011-06-22 MED ORDER — ONDANSETRON HCL 4 MG PO TABS: 4.0000 mg | ORAL_TABLET | Freq: Three times a day (TID) | ORAL | Status: DC | PRN

## 2011-06-22 NOTE — Telephone Encounter (Signed)
Pt called asking for refill on Zofran- she would like it sent to [facility] on [address] in  [place]- she can be reached at [phone]

## 2011-06-22 NOTE — ED Provider Notes (Signed)
History     CSN: [id number]  Arrival date & time 06/22/11  0205   First MD Initiated Contact with Patient 06/22/11 0243      Chief Complaint  Patient presents with  . Ankle Injury    (Consider location/radiation/quality/duration/timing/severity/associated sxs/prior treatment) HPI  Patient states she was going down some stairs and her boyfriend had clean something off of it earlier and she slipped and fell about 7 stairs. She denies hitting her head or loss of consciousness. She states she hit her right lateral thigh but her worst pain is in her right ankle. This happened just prior to arrival to the ED. She states walking makes the pain worse. Nothing makes it feel better.  PCP Dr. Syliva Overman Orthopedist Dr. Hilda Lias  Past Medical History  Diagnosis Date  . Asthma   . Migraines   . Osteoporosis   . Depression   . Seasonal allergies   . Sinusitis   . Back pain, chronic   . Nicotine addiction   . Constipation   . Vitamin d deficiency   . Gastritis   . Gastroparesis   . Pyloric spasm 03/30/2011  . Gastric outlet obstruction   . Peptic ulcer disease     Past Surgical History  Procedure Date  . Cholecystectomy     ?2002  . Laser surgery on cervix   . Carpal tunnel release     left hand  . Esophagogastroduodenoscopy 12/25/2010    [name], MD;  moderate gastritis, ?goo secondary to pylorspasm. BX showed reactive gstropathy no h.pyori, SB mucosa with intramucosal lymphocytosis and partial villous blunting (TTG 4.0 normal)  . Esophageal dilation 12/25/2010    Procedure: ESOPHAGEAL DILATION;  Surgeon: [name], MD;  Location: [facility];  Service: Endoscopy;;    Family History  Problem Relation Age of Onset  . Diabetes Father   . Liver disease Father     liver transplant at [facility], age 63  . Colon cancer Neg Hx   . Lung cancer Mother     History  Substance Use Topics  . Smoking status: Former Smoker -- 1.0 packs/day for 15 years    Types:  Cigarettes  . Smokeless tobacco: Not on file   Comment: quit 2011  . Alcohol Use: drank tonight No     none since July. Periodic heavy drinker for months at a time.  on disability for LBP and learning disability  OB History    Grav Para Term Preterm Abortions TAB SAB Ect Mult Living                  Review of Systems  All other systems reviewed and are negative.    Allergies  Penicillins  Home Medications   Current Outpatient Rx  Name Route Sig Dispense Refill  . ALBUTEROL SULFATE HFA 108 (90 BASE) MCG/ACT IN AERS Inhalation Inhale 2 puffs into the lungs every 6 (six) hours as needed. Shortness of breath     . BUDESONIDE-FORMOTEROL FUMARATE 80-4.5 MCG/ACT IN AERO Inhalation Inhale 2 puffs into the lungs 2 (two) times daily. 1 Inhaler 12  . CYCLOBENZAPRINE HCL 10 MG PO TABS Oral Take 10 mg by mouth 3 (three) times daily as needed. For muscle spasms    . DULOXETINE HCL 30 MG PO CPEP Oral Take 1 capsule (30 mg total) by mouth daily. 30 capsule 2  . ESTRADIOL 0.1 MG/GM VA CREA Vaginal Place 2 g vaginally daily. As directed    . ESTRADIOL-NORETHINDRONE ACET 1-0.5  MG PO TABS Oral Take 1 tablet by mouth daily.    Marland Kitchen ENSURE CLINICAL ST REVIGOR PO LIQD Oral Take 237 mLs by mouth 3 (three) times daily with meals. 10 Bottle 3  . FERROUS SULFATE 325 (65 FE) MG PO TABS Oral Take 325 mg by mouth daily with breakfast.    . FLINTSTONES COMPLETE 60 MG PO CHEW Oral Chew 1 tablet by mouth daily.    Marland Kitchen FLUTICASONE PROPIONATE 50 MCG/ACT NA SUSP Nasal Place 2 sprays into the nose daily. 16 g 2  . HALOBETASOL PROPIONATE 0.05 % EX CREA Topical Apply topically 2 (two) times daily.    Marland Kitchen HYDROCODONE-ACETAMINOPHEN 7.5-325 MG PO TABS Oral Take 1 tablet by mouth every 4 (four) hours as needed. For pain    . ONDANSETRON HCL 4 MG PO TABS Oral Take 4 mg by mouth every 8 (eight) hours as needed.    Marland Kitchen PANTOPRAZOLE SODIUM 40 MG PO TBEC Oral Take 1 tablet (40 mg total) by mouth daily. 30 tablet 11    BP 119/69   Pulse 72  Temp 98 F (36.7 C)  Resp 16  SpO2 99%  LMP 01/19/1997  Vital signs normal    Physical Exam  Constitutional: She is oriented to person, place, and time. She appears well-developed and well-nourished.  Non-toxic appearance. She does not appear ill. She appears distressed.  HENT:  Head: Normocephalic and atraumatic.  Right Ear: External ear normal.  Left Ear: External ear normal.  Nose: Nose normal. No mucosal edema or rhinorrhea.  Mouth/Throat: Oropharynx is clear and moist and mucous membranes are normal. No dental abscesses or uvula swelling.  Eyes: Conjunctivae and EOM are normal. Pupils are equal, round, and reactive to light.  Neck: Normal range of motion and full passive range of motion without pain. Neck supple.  Cardiovascular: Exam reveals friction rub.   Pulmonary/Chest: Effort normal and breath sounds normal. No respiratory distress. She has no rhonchi. She exhibits no crepitus.  Abdominal: Soft. Normal appearance.  Musculoskeletal: Normal range of motion. She exhibits no edema and no tenderness.       Patient's noted to have some swelling around her lateral malleolus of her right ankle. She has some mild tenderness to palpation. Her foot is nontender and she has intact dorsalis pedis pulse. She is nontender to palpation in her lower leg or around her right knee. There is no effusion noted in her knee. She also indicates she has a sore spot in her proximal fine however she has good range of motion of her hip and knee without pain.  Neurological: She is alert and oriented to person, place, and time. She has normal strength. No cranial nerve deficit.  Skin: Skin is warm, dry and intact. No rash noted. No erythema. No pallor.  Psychiatric: She has a normal mood and affect. Her speech is normal and behavior is normal. Her mood appears not anxious.    ED Course  Procedures (including critical care time)   Medications  HYDROcodone-acetaminophen (NORCO) 5-325 MG per  tablet 2 tablet (2 tablet Oral Given 06/22/11 0313)  ibuprofen (ADVIL,MOTRIN) tablet 800 mg (800 mg Oral Given 06/22/11 0313)     Patient given ice pack and placed in ASO ankle support and placed on crutches by [employer].  Labs Reviewed - No data to display Dg Ankle Complete Right  06/22/2011  *RADIOLOGY REPORT*  Clinical Data: Twisted ankle.  RIGHT ANKLE - COMPLETE 3+ VIEW  Comparison: None  Findings: The ankle mortise is maintained.  No acute fracture.  No osteochondral lesion.  IMPRESSION: No acute bony findings.  Original Report Authenticated By: P. Loralie Champagne, M.D.     1. Sprain of ankle   2. Fall    Plan discharge  Devoria Albe, MD, FACEP    MDM          Ward Givens, MD 06/22/11 [phone]

## 2011-06-22 NOTE — ED Notes (Signed)
Pt alert, nad, c/o right ankle pain, onset this evening after slip fall injury, pt ambulates to triage, request wheelchair, steady gait noted, PMS intact, no obvious deformity noted

## 2011-06-22 NOTE — Discharge Instructions (Signed)
Elevate your foot. Use ice packs to get the swelling down. Continue your hydrocodone for pain. Add ibuprofen 600 mg 4 times a day. Use the crutches until you are able to bear weight without pain. Wear the ankle support for the next couple of months to prevent reinjuring your ankle. Call Dr Hilda Lias to recheck your ankle if not improving in the next week.

## 2011-06-26 ENCOUNTER — Telehealth: Payer: Self-pay | Admitting: Family Medicine

## 2011-06-26 ENCOUNTER — Other Ambulatory Visit: Payer: Self-pay | Admitting: Family Medicine

## 2011-06-26 DIAGNOSIS — S93409A Sprain of unspecified ligament of unspecified ankle, initial encounter: Secondary | ICD-10-CM

## 2011-06-26 NOTE — Telephone Encounter (Signed)
pls refer pt to Dr Hilda Lias and let her know, order put in

## 2011-06-30 ENCOUNTER — Ambulatory Visit (HOSPITAL_COMMUNITY)
Admission: RE | Admit: 2011-06-30 | Discharge: 2011-06-30 | Disposition: A | Discharge status: Home / Self Care | Payer: Medicare Other | Source: Ambulatory Visit | Attending: Oncology | Admitting: Oncology

## 2011-06-30 DIAGNOSIS — R918 Other nonspecific abnormal finding of lung field: Secondary | ICD-10-CM | POA: Diagnosis not present

## 2011-06-30 DIAGNOSIS — J45909 Unspecified asthma, uncomplicated: Secondary | ICD-10-CM | POA: Insufficient documentation

## 2011-06-30 DIAGNOSIS — J439 Emphysema, unspecified: Secondary | ICD-10-CM | POA: Diagnosis not present

## 2011-06-30 DIAGNOSIS — Z720 Tobacco use: Secondary | ICD-10-CM

## 2011-06-30 DIAGNOSIS — Z0389 Encounter for observation for other suspected diseases and conditions ruled out: Secondary | ICD-10-CM | POA: Diagnosis not present

## 2011-07-01 ENCOUNTER — Telehealth: Payer: Self-pay | Admitting: Urgent Care

## 2011-07-01 DIAGNOSIS — S93409A Sprain of unspecified ligament of unspecified ankle, initial encounter: Secondary | ICD-10-CM | POA: Diagnosis not present

## 2011-07-01 NOTE — Telephone Encounter (Signed)
Reminder in epic for pt to follow up with RMR ONLY in August

## 2011-07-01 NOTE — Telephone Encounter (Signed)
Pt needs FU OV AUG w/ [facility] only re: pylorospasm, chronic nausea, anemia

## 2011-07-06 ENCOUNTER — Encounter: Payer: Self-pay | Admitting: Internal Medicine

## 2011-07-10 DIAGNOSIS — S93409A Sprain of unspecified ligament of unspecified ankle, initial encounter: Secondary | ICD-10-CM | POA: Diagnosis not present

## 2011-07-13 DIAGNOSIS — M545 Low back pain: Secondary | ICD-10-CM | POA: Diagnosis not present

## 2011-07-31 DIAGNOSIS — S93409A Sprain of unspecified ligament of unspecified ankle, initial encounter: Secondary | ICD-10-CM | POA: Diagnosis not present

## 2011-08-04 ENCOUNTER — Encounter (HOSPITAL_COMMUNITY): Payer: Medicare Other | Attending: Oncology | Admitting: Oncology

## 2011-08-04 VITALS — BP 105/63 | HR 60 | Temp 98.0°F | Wt 141.8 lb

## 2011-08-04 DIAGNOSIS — D649 Anemia, unspecified: Secondary | ICD-10-CM | POA: Diagnosis not present

## 2011-08-04 LAB — CBC
Platelets: 144 10*3/uL — ABNORMAL LOW (ref 150–400)
RDW: 13.6 % (ref 11.5–15.5)
WBC: 5.6 10*3/uL (ref 4.0–10.5)

## 2011-08-04 LAB — DIFFERENTIAL
Basophils Absolute: 0 10*3/uL (ref 0.0–0.1)
Lymphocytes Relative: 44 % (ref 12–46)
Neutro Abs: 2.6 10*3/uL (ref 1.7–7.7)
Neutrophils Relative %: 45 % (ref 43–77)

## 2011-08-04 NOTE — Progress Notes (Signed)
Problem #1 minimal anemia with intermittent pancytopenia which has not been present lately.  Her counts in may showed only a minimal anemia with a normal white count and normal platelet count. Since she was here in may she has had no need for antibiotics no hospitalizations no bleeding etc. She feels good most of the time. I still think she needs in endocrine consultation  We'll check her blood counts today and in 3 months and follow her for right now but she needs just to be observed presently.

## 2011-08-04 NOTE — Patient Instructions (Signed)
[facility]  Discharge Instructions  RECOMMENDATIONS MADE BY THE CONSULTANT AND ANY TEST RESULTS WILL BE SENT TO YOUR REFERRING DOCTOR.   We will do some lab work today. We will call you if there are any abnormal results. We will get you an appointment with Dr.Nida, Endocrinology. We will have you come back in 3 months for lab work and then to see the doctor.   I acknowledge that I have been informed and understand all the instructions given to me and received a copy. I do not have any more questions at this time, but understand that I may call the Specialty Clinic at [facility] at [phone] during business hours should I have any further questions or need assistance in obtaining follow-up care.    __________________________________________  _____________  __________ Signature of Patient or Authorized Representative            Date                   Time    __________________________________________ Nurse's Signature

## 2011-08-05 ENCOUNTER — Telehealth (HOSPITAL_COMMUNITY): Payer: Self-pay | Admitting: *Deleted

## 2011-08-18 DIAGNOSIS — D649 Anemia, unspecified: Secondary | ICD-10-CM | POA: Diagnosis not present

## 2011-08-18 DIAGNOSIS — M818 Other osteoporosis without current pathological fracture: Secondary | ICD-10-CM | POA: Diagnosis not present

## 2011-08-18 DIAGNOSIS — N912 Amenorrhea, unspecified: Secondary | ICD-10-CM | POA: Diagnosis not present

## 2011-08-18 DIAGNOSIS — N949 Unspecified condition associated with female genital organs and menstrual cycle: Secondary | ICD-10-CM | POA: Diagnosis not present

## 2011-08-18 DIAGNOSIS — E28319 Asymptomatic premature menopause: Secondary | ICD-10-CM | POA: Diagnosis not present

## 2011-08-25 ENCOUNTER — Encounter: Payer: Self-pay | Admitting: Internal Medicine

## 2011-08-25 ENCOUNTER — Ambulatory Visit (INDEPENDENT_AMBULATORY_CARE_PROVIDER_SITE_OTHER): Payer: Medicare Other | Admitting: Internal Medicine

## 2011-08-25 VITALS — BP 126/71 | HR 56 | Temp 98.3°F | Ht 66.0 in | Wt 141.8 lb

## 2011-08-25 DIAGNOSIS — K311 Adult hypertrophic pyloric stenosis: Secondary | ICD-10-CM | POA: Diagnosis not present

## 2011-08-25 NOTE — Progress Notes (Signed)
Primary Care Physician:  Syliva Overman, MD Primary Gastroenterologist:  Dr.   Pre-Procedure History & Physical: HPI:  Tracey Daniel is a 31 y.o. female here for followup gastroparesis/ pylorospasm/gastric outlet obstruction. Status post Botox and pyloric channel dilation in December of last year. Patient continues to do well. Constipated may go 2 weeks without a bowel movement does not take MiraLax regularly. Has abdominal cramps early satiety episodes about once monthly-since Botox injection; otherwise does very well. No melena rectal bleeding. Villous blunting on prior small bowel biopsied nonspecific. TTG antibody normal. Gained 3 pounds since her last visit here in April. Apparently, blood counts much better through Dr. Arnell Asal office recently.  Past Medical History  Diagnosis Date  . Asthma   . Migraines   . Osteoporosis   . Depression   . Seasonal allergies   . Sinusitis   . Back pain, chronic   . Nicotine addiction   . Constipation   . Vitamin d deficiency   . Gastritis   . Gastroparesis   . Pyloric spasm 03/30/2011  . Gastric outlet obstruction   . Peptic ulcer disease     Past Surgical History  Procedure Date  . Cholecystectomy     ?2002  . Laser surgery on cervix   . Carpal tunnel release     left hand  . Esophagogastroduodenoscopy 12/25/2010    [name], MD;  moderate gastritis, ?goo secondary to pylorspasm. BX showed reactive gstropathy no h.pyori, SB mucosa with intramucosal lymphocytosis and partial villous blunting (TTG 4.0 normal)  . Esophageal dilation 12/25/2010    Procedure: ESOPHAGEAL DILATION;  Surgeon: [name], MD;  Location: [facility];  Service: Endoscopy;;    Prior to Admission medications   Medication Sig Start Date End Date Taking? Authorizing Provider  cyclobenzaprine (FLEXERIL) 10 MG tablet Take 10 mg by mouth 3 (three) times daily as needed. For muscle spasms   Yes Historical Provider, MD  estradiol (ESTRACE VAGINAL) 0.1  MG/GM vaginal cream Place 2 g vaginally daily. As directed   Yes Historical Provider, MD  estradiol-norethindrone (ACTIVELLA) 1-0.5 MG per tablet Take 1 tablet by mouth daily.   Yes Historical Provider, MD  feeding supplement (ENSURE CLINICAL STRENGTH) LIQD Take 237 mLs by mouth 3 (three) times daily with meals. 01/01/11  Yes Nishant Dhungel, MD  ferrous sulfate 325 (65 FE) MG tablet Take 325 mg by mouth daily with breakfast.   Yes Historical Provider, MD  flintstones complete (FLINTSTONES) 60 MG chewable tablet Chew 1 tablet by mouth daily.   Yes Historical Provider, MD  fluticasone (FLONASE) 50 MCG/ACT nasal spray Place 2 sprays into the nose daily. 04/08/11 04/07/12 Yes Kerri Perches, MD  halobetasol (ULTRAVATE) 0.05 % cream Apply topically 2 (two) times daily.   Yes Historical Provider, MD  HYDROcodone-acetaminophen (NORCO) 7.5-325 MG per tablet Take 1 tablet by mouth every 4 (four) hours as needed. For pain   Yes Historical Provider, MD  ondansetron (ZOFRAN) 4 MG tablet Take 1 tablet (4 mg total) by mouth every 8 (eight) hours as needed. 06/22/11  Yes Joselyn Arrow, NP  pantoprazole (PROTONIX) 40 MG tablet Take 1 tablet (40 mg total) by mouth daily. 01/30/11 01/30/12 Yes Tiffany Kocher, PA  albuterol (PROVENTIL HFA;VENTOLIN HFA) 108 (90 BASE) MCG/ACT inhaler Inhale 2 puffs into the lungs every 6 (six) hours as needed. Shortness of breath  08/21/10 08/21/11  Kerri Perches, MD  budesonide-formoterol [facility]) 80-4.5 MCG/ACT inhaler Inhale 2 puffs into the lungs 2 (  two) times daily. 08/21/10 08/21/11  Kerri Perches, MD  DULoxetine (CYMBALTA) 30 MG capsule Take 1 capsule (30 mg total) by mouth daily. 08/21/10 08/21/11  Kerri Perches, MD    Allergies as of 08/25/2011 - Review Complete 08/25/2011  Allergen Reaction Noted  . Penicillins      Family History  Problem Relation Age of Onset  . Diabetes Father   . Liver disease Father     liver transplant at [facility], age 50  . Colon cancer Neg Hx    . Lung cancer Mother     History   Social History  . Marital Status: Single    Spouse Name: N/A    Number of Children: 0  . Years of Education: N/A   Occupational History  . disability    Social History Main Topics  . Smoking status: Former Smoker -- 1.0 packs/day for 15 years    Types: Cigarettes  . Smokeless tobacco: Not on file   Comment: quit 2011  . Alcohol Use: No     none since July. Periodic heavy drinker for months at a time.  . Drug Use: No  . Sexually Active: Not on file   Other Topics Concern  . Not on file   Social History Narrative  . No narrative on file    Review of Systems: See HPI, otherwise negative ROS  Physical Exam: BP 126/71  Pulse 56  Temp 98.3 F (36.8 C) (Temporal)  Ht 5\' 6"  (1.676 m)  Wt 141 lb 12.8 oz (64.32 kg)  BMI 22.89 kg/m2  LMP 01/19/1997 General:   Alert,  Well-developed, well-nourished, pleasant and cooperative in NAD Skin:  Intact without significant lesions or rashes. Eyes:  Sclera clear, no icterus.   Conjunctiva pink. Ears:  Normal auditory acuity. Nose:  No deformity, discharge,  or lesions. Mouth:  No deformity or lesions. Neck:  Supple; no masses or thyromegaly. No significant cervical adenopathy. Lungs:  Clear throughout to auscultation.   No wheezes, crackles, or rhonchi. No acute distress. Heart:  Regular rate and rhythm; no murmurs, clicks, rubs,  or gallops. Abdomen: Non-distended, normal bowel sounds.  Soft and nontender without appreciable mass or hepatosplenomegaly. No succussion splash Pulses:  Normal pulses noted. Extremities:  Without clubbing or edema.  Impression/Plan:  History of gastric outlet obstruction secondary to pylorospasm-status post dilation and Botox. Clinically, doing well aside from constipation. She's gained 3 pounds which is reassuring. She does not use MiraLax properly.  Recommendations:   Continue gastrparesis diet  Protonix 40 mg day  Miralax every night if no BM on any given  day  Office visit in 3 months

## 2011-08-25 NOTE — Patient Instructions (Addendum)
  Continue gastrparesis diet  Protonix 40 mg day  Miralax every night if no BM on any given day  Office visit in 3 months

## 2011-08-26 ENCOUNTER — Other Ambulatory Visit (HOSPITAL_COMMUNITY): Payer: Self-pay | Admitting: "Endocrinology

## 2011-08-26 DIAGNOSIS — M818 Other osteoporosis without current pathological fracture: Secondary | ICD-10-CM

## 2011-08-28 ENCOUNTER — Ambulatory Visit (HOSPITAL_COMMUNITY)
Admission: RE | Admit: 2011-08-28 | Discharge: 2011-08-28 | Disposition: A | Discharge status: Home / Self Care | Payer: Medicare Other | Source: Ambulatory Visit | Attending: "Endocrinology | Admitting: "Endocrinology

## 2011-08-28 DIAGNOSIS — Z780 Asymptomatic menopausal state: Secondary | ICD-10-CM | POA: Insufficient documentation

## 2011-08-28 DIAGNOSIS — M818 Other osteoporosis without current pathological fracture: Secondary | ICD-10-CM

## 2011-08-28 DIAGNOSIS — M810 Age-related osteoporosis without current pathological fracture: Secondary | ICD-10-CM | POA: Diagnosis not present

## 2011-08-28 DIAGNOSIS — M899 Disorder of bone, unspecified: Secondary | ICD-10-CM | POA: Insufficient documentation

## 2011-08-28 DIAGNOSIS — S93409A Sprain of unspecified ligament of unspecified ankle, initial encounter: Secondary | ICD-10-CM | POA: Diagnosis not present

## 2011-08-28 DIAGNOSIS — M949 Disorder of cartilage, unspecified: Secondary | ICD-10-CM | POA: Insufficient documentation

## 2011-08-31 ENCOUNTER — Ambulatory Visit: Payer: Medicare Other | Admitting: Family Medicine

## 2011-09-08 DIAGNOSIS — N912 Amenorrhea, unspecified: Secondary | ICD-10-CM | POA: Diagnosis not present

## 2011-09-08 DIAGNOSIS — E288 Other ovarian dysfunction: Secondary | ICD-10-CM | POA: Diagnosis not present

## 2011-09-08 DIAGNOSIS — M818 Other osteoporosis without current pathological fracture: Secondary | ICD-10-CM | POA: Diagnosis not present

## 2011-09-25 DIAGNOSIS — S93409A Sprain of unspecified ligament of unspecified ankle, initial encounter: Secondary | ICD-10-CM | POA: Diagnosis not present

## 2011-10-09 ENCOUNTER — Encounter: Payer: Self-pay | Admitting: Family Medicine

## 2011-10-09 ENCOUNTER — Ambulatory Visit (INDEPENDENT_AMBULATORY_CARE_PROVIDER_SITE_OTHER): Payer: Medicare Other | Admitting: Family Medicine

## 2011-10-09 ENCOUNTER — Other Ambulatory Visit: Payer: Self-pay

## 2011-10-09 VITALS — BP 132/74 | HR 66 | Resp 18 | Ht 66.0 in | Wt 142.1 lb

## 2011-10-09 DIAGNOSIS — R51 Headache: Secondary | ICD-10-CM | POA: Diagnosis not present

## 2011-10-09 DIAGNOSIS — M25559 Pain in unspecified hip: Secondary | ICD-10-CM

## 2011-10-09 DIAGNOSIS — M25551 Pain in right hip: Secondary | ICD-10-CM

## 2011-10-09 DIAGNOSIS — J45909 Unspecified asthma, uncomplicated: Secondary | ICD-10-CM | POA: Diagnosis not present

## 2011-10-09 DIAGNOSIS — J309 Allergic rhinitis, unspecified: Secondary | ICD-10-CM | POA: Diagnosis not present

## 2011-10-09 DIAGNOSIS — R109 Unspecified abdominal pain: Secondary | ICD-10-CM | POA: Diagnosis not present

## 2011-10-09 DIAGNOSIS — F329 Major depressive disorder, single episode, unspecified: Secondary | ICD-10-CM

## 2011-10-09 DIAGNOSIS — Z23 Encounter for immunization: Secondary | ICD-10-CM | POA: Diagnosis not present

## 2011-10-09 MED ORDER — BUTALBITAL-APAP-CAFFEINE 50-325-40 MG PO TABS: ORAL_TABLET | ORAL | Status: AC

## 2011-10-09 MED ORDER — LORATADINE 10 MG PO TABS: 10.0000 mg | ORAL_TABLET | Freq: Every day | ORAL | Status: DC

## 2011-10-09 NOTE — Assessment & Plan Note (Signed)
Improved significantly per pt history

## 2011-10-09 NOTE — Assessment & Plan Note (Signed)
2 week h/o increased headaches, on no sinus med, will send in a few fioricet tabs for as needed use 10 per month

## 2011-10-09 NOTE — Patient Instructions (Addendum)
F/u in January, please call if you need me before.  Please make the changes we talked about in terms of staying away from alcohol and limiting use   Flu vaccine today.  Please take medication prescribed by Dr Fransico Him for bones.  It is important that you exercise regularly at least 30 minutes 5 times a week. If you develop chest pain, have severe difficulty breathing, or feel very tired, stop exercising immediately and seek medical attention   You need to re establish with psychiatry  Stop flonase, claritin is sent in  Medication is sent in for sparing use for headaches. Your headache is mainly from uncontrolled allergies, please atke allergy med as prescribed

## 2011-10-09 NOTE — Progress Notes (Signed)
  Subjective:    Patient ID: Tracey Daniel, female    DOB: January 24, 1980, 31 y.o.   MRN: [id number]  HPI Pt in for follow up. She is evaluated by GI, gyne, endocrine and oncology as well as psychiatry who she needs to re establish with. Has had botox treatment for abdominal pain and spasm with success, states current meds are working well except at times her stomach feels slightly sour and bloated, thinks it is related to what she eats Hematology decided to have endo evaluate her, recent dexa shows osteopenia, and she has scripts for daily calcium and vit D which she needs to fill. Fells depressed from time to time, overwhelmed, not suicidal or homicidal, other than cymbalta, self medicates with marijuana, which she states also helps with her appetite. 2 week h/o increased frontal headache , not using allergy spray,wants tablets instead    Review of Systems See HPI Denies recent fever or chills. Denies sinus pressure, nasal congestion, ear pain or sore throat. Denies chest congestion, productive cough or wheezing. Denies chest pains, palpitations and leg swelling Denies dysuria, frequency, hesitancy or incontinence.  Denies  seizures, numbness, or tingling. Denies depression, anxiety or insomnia. Denies skin break down or rash.        Objective:   Physical Exam  Patient alert and oriented and in no cardiopulmonary distress.  HEENT: No facial asymmetry, EOMI, no sinus tenderness,  oropharynx pink and moist.  Neck supple no adenopathy.  Chest: Clear to auscultation bilaterally.  CVS: S1, S2 no murmurs, no S3.  ABD: Soft non tender. Bowel sounds normal.  Ext: No edema  MS: Adequate ROM spine, shoulders, hips and knees.  Skin: Intact, no ulcerations or rash noted.  Psych: Good eye contact, normal affect. Memory intact not anxious or depressed appearing.  CNS: CN 2-12 intact, power, tone and sensation normal throughout.       Assessment & Plan:

## 2011-10-09 NOTE — Assessment & Plan Note (Signed)
Does not want nasal spray will change to oral med

## 2011-10-09 NOTE — Assessment & Plan Note (Signed)
On chronic hydrocodone through orthopedics

## 2011-10-09 NOTE — Assessment & Plan Note (Signed)
Controlled, no change in medication  

## 2011-10-09 NOTE — Assessment & Plan Note (Signed)
Uncontrolled, needs to re establish with psych

## 2011-10-21 ENCOUNTER — Encounter: Payer: Self-pay | Admitting: "Endocrinology

## 2011-10-23 DIAGNOSIS — S93409A Sprain of unspecified ligament of unspecified ankle, initial encounter: Secondary | ICD-10-CM | POA: Diagnosis not present

## 2011-10-27 ENCOUNTER — Encounter: Payer: Self-pay | Admitting: *Deleted

## 2011-11-16 ENCOUNTER — Encounter (HOSPITAL_COMMUNITY): Payer: Medicare Other | Attending: Oncology

## 2011-11-16 DIAGNOSIS — D649 Anemia, unspecified: Secondary | ICD-10-CM | POA: Insufficient documentation

## 2011-11-16 LAB — CBC
HCT: 35.3 % — ABNORMAL LOW (ref 36.0–46.0)
Hemoglobin: 11.6 g/dL — ABNORMAL LOW (ref 12.0–15.0)
RDW: 13.9 % (ref 11.5–15.5)
WBC: 5.3 10*3/uL (ref 4.0–10.5)

## 2011-11-16 LAB — DIFFERENTIAL
Basophils Absolute: 0 10*3/uL (ref 0.0–0.1)
Lymphocytes Relative: 43 % (ref 12–46)
Monocytes Absolute: 0.5 10*3/uL (ref 0.1–1.0)
Monocytes Relative: 9 % (ref 3–12)
Neutro Abs: 2.4 10*3/uL (ref 1.7–7.7)

## 2011-11-16 NOTE — Progress Notes (Signed)
Labs drawn today for cbc/diff 

## 2011-11-18 ENCOUNTER — Ambulatory Visit (HOSPITAL_COMMUNITY): Payer: Medicare Other | Admitting: Oncology

## 2011-11-24 ENCOUNTER — Other Ambulatory Visit (HOSPITAL_COMMUNITY): Payer: Self-pay | Admitting: Orthopaedic Surgery

## 2011-11-24 DIAGNOSIS — M25559 Pain in unspecified hip: Secondary | ICD-10-CM | POA: Diagnosis not present

## 2011-11-24 DIAGNOSIS — S93409A Sprain of unspecified ligament of unspecified ankle, initial encounter: Secondary | ICD-10-CM | POA: Diagnosis not present

## 2011-11-24 DIAGNOSIS — M25571 Pain in right ankle and joints of right foot: Secondary | ICD-10-CM

## 2011-11-26 ENCOUNTER — Ambulatory Visit (HOSPITAL_COMMUNITY)
Admission: RE | Admit: 2011-11-26 | Discharge: 2011-11-26 | Disposition: A | Discharge status: Home / Self Care | Payer: Medicare Other | Source: Ambulatory Visit | Attending: Orthopaedic Surgery | Admitting: Orthopaedic Surgery

## 2011-11-26 DIAGNOSIS — W19XXXA Unspecified fall, initial encounter: Secondary | ICD-10-CM | POA: Insufficient documentation

## 2011-11-26 DIAGNOSIS — S99919A Unspecified injury of unspecified ankle, initial encounter: Secondary | ICD-10-CM | POA: Diagnosis not present

## 2011-11-26 DIAGNOSIS — M25571 Pain in right ankle and joints of right foot: Secondary | ICD-10-CM

## 2011-11-26 DIAGNOSIS — S99929A Unspecified injury of unspecified foot, initial encounter: Secondary | ICD-10-CM | POA: Diagnosis not present

## 2011-11-26 DIAGNOSIS — M25579 Pain in unspecified ankle and joints of unspecified foot: Secondary | ICD-10-CM | POA: Diagnosis not present

## 2011-11-26 DIAGNOSIS — S8990XA Unspecified injury of unspecified lower leg, initial encounter: Secondary | ICD-10-CM | POA: Diagnosis not present

## 2011-11-30 ENCOUNTER — Encounter (HOSPITAL_COMMUNITY): Payer: Self-pay | Admitting: Oncology

## 2011-11-30 ENCOUNTER — Encounter (HOSPITAL_COMMUNITY): Payer: Medicare Other | Attending: Oncology | Admitting: Oncology

## 2011-11-30 VITALS — BP 122/97 | HR 74 | Temp 98.0°F | Resp 16 | Wt 147.5 lb

## 2011-11-30 DIAGNOSIS — D649 Anemia, unspecified: Secondary | ICD-10-CM

## 2011-11-30 DIAGNOSIS — M949 Disorder of cartilage, unspecified: Secondary | ICD-10-CM | POA: Diagnosis not present

## 2011-11-30 DIAGNOSIS — M899 Disorder of bone, unspecified: Secondary | ICD-10-CM

## 2011-11-30 DIAGNOSIS — E28319 Asymptomatic premature menopause: Secondary | ICD-10-CM

## 2011-11-30 DIAGNOSIS — D696 Thrombocytopenia, unspecified: Secondary | ICD-10-CM | POA: Diagnosis not present

## 2011-11-30 NOTE — Patient Instructions (Addendum)
[facility]  Discharge Instructions  RECOMMENDATIONS MADE BY THE CONSULTANT AND ANY TEST RESULTS WILL BE SENT TO YOUR REFERRING DOCTOR.   EXAM FINDINGS BY MD TODAY AND SIGNS AND SYMPTOMS TO REPORT TO CLINIC OR PRIMARY MD: exam and discussion by PA  MEDICATIONS PRESCRIBED: none   INSTRUCTIONS GIVEN AND DISCUSSED: Other :  Report increased fatigue or shortness of breath.  SPECIAL INSTRUCTIONS/FOLLOW-UP: Lab work Needed in 4 months and Return to Clinic for follow-up in 4 months.   I acknowledge that I have been informed and understand all the instructions given to me and received a copy. I do not have any more questions at this time, but understand that I may call the Specialty Clinic at [facility] at [phone] during business hours should I have any further questions or need assistance in obtaining follow-up care.    __________________________________________  _____________  __________ Signature of Patient or Authorized Representative            Date                   Time    __________________________________________ Nurse's Signature

## 2011-11-30 NOTE — Progress Notes (Signed)
Tracey Overman, MD [address] [place] [address]  1. Anemia  CBC with Differential    CURRENT THERAPY: Observation  INTERVAL HISTORY: Tracey Daniel 31 y.o. female returns for  regular  visit for followup of minimal anemia with intermittent thrombocytopenia.  [name] is doing well.  She denies any complaints.  She continues to only have 2 BMs per week.  Fortunately, she did see [name] on September 08, 2011.  She reports that he told [name] that she has elavated testosterone level.  She is due to perform more lab work in the near future and to see him again in the next few weeks.  I have encouraged her to continue to follow-up with [name] as directed.  She admits to increased hair growth in a female-like pattern.  She denies any increased hair growth on arms.  After reviewing [name] note, the patient is in premature menopause and has an elevated testosterone level.  She is not a good candidate for hormone therapy due to her smoking.  [name] has given her Ca 1200 mg and 2000 units of Vit D daily.    I personally reviewed and went over laboratory results with the patient.  Her labs reveal stability with only a minimal anemia with a Hgb of 11.6.  Her other studies are unremarkable and WNL.    So we will perform labs in 4 months and see her back in 4 months for follow-up.     Past Medical History  Diagnosis Date  . Asthma   . Migraines   . Osteoporosis   . Depression   . Seasonal allergies   . Sinusitis   . Back pain, chronic   . Nicotine addiction   . Constipation   . Vitamin D deficiency   . Gastritis   . Gastroparesis   . Pyloric spasm 03/30/2011  . Gastric outlet obstruction   . Peptic ulcer disease     has DEPRESSION; ASTHMA; CONSTIPATION; AMENORRHEA, SECONDARY; ECZEMA; BACK PAIN, CHRONIC; RASH AND OTHER NONSPECIFIC SKIN ERUPTION; HEADACHE; CLOSED FRACTURE OF METATARSAL BONE; GERD (gastroesophageal reflux disease); Cystitis; Asthma; H. pylori  infection; Epigastric pain; Chronic diarrhea; Intractable vomiting; Pylorospasm; Gastric outlet obstruction; Anemia; Hip pain, right; Cystitis, acute; Abdominal pain; Vomiting; Colitis - presumed infectious origin; Pyloric spasm; Allergic rhinitis; Cyst; Infectious folliculitis; Constipation; Gastroparesis; and Hypercapnia on her problem list.     is allergic to latex and penicillins.  [name] does not currently have medications on file.  Past Surgical History  Procedure Date  . Cholecystectomy     ?2002  . Laser surgery on cervix   . Carpal tunnel release     left hand  . Esophagogastroduodenoscopy 12/25/2010    [name], MD;  moderate gastritis, ?goo secondary to pylorspasm. BX showed reactive gstropathy no h.pyori, SB mucosa with intramucosal lymphocytosis and partial villous blunting (TTG 4.0 normal)  . Esophageal dilation 12/25/2010    Procedure: ESOPHAGEAL DILATION;  Surgeon: [name], MD;  Location: [facility];  Service: Endoscopy;;    Denies any headaches, dizziness, double vision, fevers, chills, night sweats, nausea, vomiting, diarrhea, constipation, chest pain, heart palpitations, shortness of breath, blood in stool, black tarry stool, urinary pain, urinary burning, urinary frequency, hematuria.   PHYSICAL EXAMINATION  ECOG PERFORMANCE STATUS: 0 - Asymptomatic  Filed Vitals:   11/30/11 1010  BP: 122/97  Pulse: 74  Temp: 98 F (36.7 C)  Resp: 16    GENERAL:alert, no distress, well  nourished, well developed, comfortable, cooperative and smiling SKIN: skin color, texture, turgor are normal, no rashes or significant lesions HEAD: Thick, prominent jaw line with large forehead, appearing with female-like qualities. EYES: normal EARS: External ears normal OROPHARYNX:mucous membranes are moist  NECK: supple, no adenopathy, thyroid normal size, non-tender, without nodularity, no stridor, non-tender, trachea midline LYMPH:  no palpable lymphadenopathy, no  hepatosplenomegaly BREAST:not examined LUNGS: clear to auscultation and percussion HEART: regular rate & rhythm, no murmurs, no gallops, S1 normal and S2 normal ABDOMEN:abdomen soft, non-tender, normal bowel sounds, no masses or organomegaly and no hepatosplenomegaly BACK: Back symmetric, no curvature., No CVA tenderness EXTREMITIES:less then 2 second capillary refill, no joint deformities, effusion, or inflammation, no edema, no skin discoloration, no clubbing, no cyanosis  NEURO: alert & oriented x 3 with fluent speech, no focal motor/sensory deficits, gait normal    LABORATORY DATA: CBC    Component Value Date/Time   WBC 5.3 11/16/2011 1022   RBC 3.97 11/16/2011 1022   HGB 11.6* 11/16/2011 1022   HCT 35.3* 11/16/2011 1022   PLT 160 11/16/2011 1022   MCV 88.9 11/16/2011 1022   MCH 29.2 11/16/2011 1022   MCHC 32.9 11/16/2011 1022   RDW 13.9 11/16/2011 1022   LYMPHSABS 2.3 11/16/2011 1022   MONOABS 0.5 11/16/2011 1022   EOSABS 0.1 11/16/2011 1022   BASOSABS 0.0 11/16/2011 1022     Results for Tracey, Daniel (MRN [id number]) as of 11/30/2011 10:01  Ref. Range 04/27/2011 12:30 05/21/2011 01:50 06/19/2011 13:09 08/04/2011 12:16 11/16/2011 10:22  WBC Latest Range: 4.0-10.5 K/uL 6.6  5.9 5.6 5.3  RBC Latest Range: 3.87-5.11 MIL/uL 3.55 (L)  3.86 (L) 3.48 (L) 3.97  Hemoglobin Latest Range: 12.0-15.0 g/dL 40.9 (L) 81.1 91.4 (L) 10.4 (L) 11.6 (L)  HCT Latest Range: 36.0-46.0 % 32.6 (L) 36.0 33.8 (L) 31.1 (L) 35.3 (L)  MCV Latest Range: 78.0-100.0 fL 91.8  87.6 89.4 88.9  MCH Latest Range: 26.0-34.0 pg 28.7  29.3 29.9 29.2  MCHC Latest Range: 30.0-36.0 g/dL 78.2  95.6 21.3 08.6  RDW Latest Range: 11.5-15.5 % 12.6  13.0 13.6 13.9  Platelets Latest Range: 150-400 K/uL 134 (L)  164 144 (L) 160      ASSESSMENT:  1. Minimal anemia with intermittent minimal thrombocytopenia 2. Premature menopause 3. Elevated testosterone level 4. Tobacco abuse, > 45 pack year tobacco history 5.  Osteopenia, on Ca 1200 mg and Vit D 2000 units daily.    PLAN:  1. I personally reviewed and went over laboratory results with the patient. 2. Referral to endocrinology for evaluation of pituitary insufficiency 3. Lab work in 4 months: CBC diff 4. Continue follow-up with [name] (Endocrinology) as directed and scheduled.  5. Return in 4 months for follow-up  All questions were answered. The patient knows to call the clinic with any problems, questions or concerns. We can certainly see the patient much sooner if necessary.  The patient and plan discussed with Glenford Peers, MD and he is in agreement with the aforementioned.  Sokhna Christoph

## 2011-12-01 DIAGNOSIS — S93409A Sprain of unspecified ligament of unspecified ankle, initial encounter: Secondary | ICD-10-CM | POA: Diagnosis not present

## 2011-12-02 ENCOUNTER — Encounter: Payer: Self-pay | Admitting: Urgent Care

## 2011-12-02 ENCOUNTER — Ambulatory Visit (INDEPENDENT_AMBULATORY_CARE_PROVIDER_SITE_OTHER): Payer: Medicare Other | Admitting: Urgent Care

## 2011-12-02 VITALS — BP 114/63 | HR 61 | Temp 97.2°F | Ht 66.0 in | Wt 148.4 lb

## 2011-12-02 DIAGNOSIS — K219 Gastro-esophageal reflux disease without esophagitis: Secondary | ICD-10-CM

## 2011-12-02 DIAGNOSIS — E28319 Asymptomatic premature menopause: Secondary | ICD-10-CM | POA: Diagnosis not present

## 2011-12-02 DIAGNOSIS — K3184 Gastroparesis: Secondary | ICD-10-CM

## 2011-12-02 DIAGNOSIS — K313 Pylorospasm, not elsewhere classified: Secondary | ICD-10-CM | POA: Diagnosis not present

## 2011-12-02 DIAGNOSIS — M818 Other osteoporosis without current pathological fracture: Secondary | ICD-10-CM | POA: Diagnosis not present

## 2011-12-02 DIAGNOSIS — N912 Amenorrhea, unspecified: Secondary | ICD-10-CM | POA: Diagnosis not present

## 2011-12-02 DIAGNOSIS — K5900 Constipation, unspecified: Secondary | ICD-10-CM | POA: Diagnosis not present

## 2011-12-02 DIAGNOSIS — D649 Anemia, unspecified: Secondary | ICD-10-CM

## 2011-12-02 MED ORDER — PANTOPRAZOLE SODIUM 40 MG PO TBEC: 40.0000 mg | DELAYED_RELEASE_TABLET | Freq: Two times a day (BID) | ORAL | Status: DC

## 2011-12-02 MED ORDER — POLYETHYLENE GLYCOL 3350 17 GM/SCOOP PO POWD: 17.0000 g | Freq: Every day | ORAL | Status: DC

## 2011-12-02 NOTE — Assessment & Plan Note (Signed)
Increase to BID Protonix 40mg  since she is having breakthrough heartburn.  Gastroparesis may be contributing factor as well.

## 2011-12-02 NOTE — Assessment & Plan Note (Signed)
Doing well.  Some issues with nausea &  GERD.

## 2011-12-02 NOTE — Progress Notes (Signed)
Faxed to PCP

## 2011-12-02 NOTE — Patient Instructions (Addendum)
Follow up with Dr Mariel Sleet & Nida as planned Gastroparesis step diet when you have severe nausea or vomiting. Increase protonix to 40mg  before breakfast & dinner Begin Miralax 17 grams daily for constipation.  Hold if diarrhea. Call if not better in a few days with changes in medications. Constipation, Adult Constipation is when a person has fewer than 3 bowel movements a week; has difficulty having a bowel movement; or has stools that are dry, hard, or larger than normal. As people grow older, constipation is more common. If you try to fix constipation with medicines that make you have a bowel movement (laxatives), the problem may get worse. Long-term laxative use may cause the muscles of the colon to become weak. A low-fiber diet, not taking in enough fluids, and taking certain medicines may make constipation worse. CAUSES   Certain medicines, such as antidepressants, pain medicine, iron supplements, antacids, and water pills.   Certain diseases, such as diabetes, irritable bowel syndrome (IBS), thyroid disease, or depression.   Not drinking enough water.   Not eating enough fiber-rich foods.   Stress or travel.  Lack of physical activity or exercise.  Not going to the restroom when there is the urge to have a bowel movement.  Ignoring the urge to have a bowel movement.  Using laxatives too much. SYMPTOMS   Having fewer than 3 bowel movements a week.   Straining to have a bowel movement.   Having hard, dry, or larger than normal stools.   Feeling full or bloated.   Pain in the lower abdomen.  Not feeling relief after having a bowel movement. DIAGNOSIS  Your caregiver will take a medical history and perform a physical exam. Further testing may be done for severe constipation. Some tests may include:   A barium enema X-ray to examine your rectum, colon, and sometimes, your small intestine.  A sigmoidoscopy to examine your lower colon.  A colonoscopy to examine  your entire colon. TREATMENT  Treatment will depend on the severity of your constipation and what is causing it. Some dietary treatments include drinking more fluids and eating more fiber-rich foods. Lifestyle treatments may include regular exercise. If these diet and lifestyle recommendations do not help, your caregiver may recommend taking over-the-counter laxative medicines to help you have bowel movements. Prescription medicines may be prescribed if over-the-counter medicines do not work.  HOME CARE INSTRUCTIONS   Increase dietary fiber in your diet, such as fruits, vegetables, whole grains, and beans. Limit high-fat and processed sugars in your diet, such as [place] fries, hamburgers, cookies, candies, and soda.   A fiber supplement may be added to your diet if you cannot get enough fiber from foods.   Drink enough fluids to keep your urine clear or pale yellow.   Exercise regularly or as directed by your caregiver.   Go to the restroom when you have the urge to go. Do not hold it.  Only take medicines as directed by your caregiver. Do not take other medicines for constipation without talking to your caregiver first. SEEK IMMEDIATE MEDICAL CARE IF:   You have bright red blood in your stool.   Your constipation lasts for more than 4 days or gets worse.   You have abdominal or rectal pain.   You have thin, pencil-like stools.  You have unexplained weight loss. MAKE SURE YOU:   Understand these instructions.  Will watch your condition.  Will get help right away if you are not doing well or get worse. Document  Released: 10/04/2003 Document Revised: 03/30/2011 Document Reviewed: 12/09/2010 [facility] Patient Information 2013 [place], [place].

## 2011-12-02 NOTE — Assessment & Plan Note (Addendum)
Hgb improving, but not quite normal.  Follow up with Dr Mariel Sleet & Nida as planned.

## 2011-12-02 NOTE — Progress Notes (Signed)
Primary Care Physician:  Syliva Overman, MD Primary Gastroenterologist:  Dr. Jena Gauss  Chief Complaint  Patient presents with  . Follow-up    pylorospasm, GERD, constipation, gastroparesis    HPI:  Tracey Daniel is a 31 y.o. female here for follow up for GERD, hx of pylorospasm & gastroparesis.  Hx pylorospasm causing GOO s/p botox injection Dec 2012 by Dr Alycia Rossetti.  Denies vomiting,  Rare nausea.  Requesting gastroparesis diet.  C/o chronic constipation, but has not tried any medications recently for it.  She has been having 1-2 BMs per week.  Denies rectal bleeding or melena.  Weight stable.  She has been having heartburn almost daily despite protonix 40mg  daily.  She is followed by Dr Mariel Sleet for anemia.  She also has an upcoming appt with [name].  She denies dysphagia, odynophagia or anorexia.     Past Medical History  Diagnosis Date  . Asthma   . Migraines   . Osteoporosis   . Depression   . Seasonal allergies   . Sinusitis   . Back pain, chronic   . Nicotine addiction   . Constipation   . Vitamin D deficiency   . Gastritis   . Gastroparesis   . Pyloric spasm 03/30/2011  . Gastric outlet obstruction   . Peptic ulcer disease     Past Surgical History  Procedure Date  . Cholecystectomy     ?2002  . Laser surgery on cervix   . Carpal tunnel release     left hand  . Esophagogastroduodenoscopy 12/25/2010    [name], MD;  moderate gastritis, ?goo secondary to pylorspasm. BX showed reactive gstropathy no h.pyori, SB mucosa with intramucosal lymphocytosis and partial villous blunting (TTG 4.0 normal)  . Esophageal dilation 12/25/2010    Procedure: ESOPHAGEAL DILATION;  Surgeon: [name], MD;  Location: [facility];  Service: Endoscopy;;    Current Outpatient Prescriptions  Medication Sig Dispense Refill  . butalbital-acetaminophen-caffeine (FIORICET) 50-325-40 MG per tablet One tablet once daily, as needed, for severe headache.  Ten tablets to last 30 days   10 tablet  3  . calcium carbonate 1250 MG capsule Take 1,250 mg by mouth 2 (two) times daily with a meal.      . Cholecalciferol (VITAMIN D3) 2000 UNITS CHEW Chew by mouth.      . cyclobenzaprine (FLEXERIL) 10 MG tablet Take 10 mg by mouth 3 (three) times daily as needed. For muscle spasms      . estradiol (ESTRACE VAGINAL) 0.1 MG/GM vaginal cream Place 2 g vaginally daily. As directed      . feeding supplement (ENSURE CLINICAL STRENGTH) LIQD Take 237 mLs by mouth 3 (three) times daily with meals.  10 Bottle  3  . ferrous sulfate 325 (65 FE) MG tablet Take 325 mg by mouth daily with breakfast.      . flintstones complete (FLINTSTONES) 60 MG chewable tablet Chew 1 tablet by mouth daily.      Marland Kitchen HYDROcodone-acetaminophen (NORCO) 7.5-325 MG per tablet Take 1 tablet by mouth every 4 (four) hours as needed. For pain      . loratadine (CLARITIN) 10 MG tablet Take 1 tablet (10 mg total) by mouth daily.  30 tablet  5  . ondansetron (ZOFRAN) 4 MG tablet Take 1 tablet (4 mg total) by mouth every 8 (eight) hours as needed.  20 tablet  0  . pantoprazole (PROTONIX) 40 MG tablet Take 1 tablet (40 mg total) by mouth 2 (two) times  daily.  60 tablet  5  . [DISCONTINUED] pantoprazole (PROTONIX) 40 MG tablet Take 1 tablet (40 mg total) by mouth daily.  30 tablet  11  . albuterol (PROVENTIL HFA;VENTOLIN HFA) 108 (90 BASE) MCG/ACT inhaler Inhale 2 puffs into the lungs every 6 (six) hours as needed. Shortness of breath       . budesonide-formoterol (SYMBICORT) 80-4.5 MCG/ACT inhaler Inhale 2 puffs into the lungs 2 (two) times daily.  1 Inhaler  12  . DULoxetine (CYMBALTA) 30 MG capsule Take 1 capsule (30 mg total) by mouth daily.  30 capsule  2  . polyethylene glycol powder (MIRALAX) powder Take 17 g by mouth daily.  527 g  11    Allergies as of 12/02/2011 - Review Complete 12/02/2011  Allergen Reaction Noted  . Latex Itching and Other (See Comments) 11/30/2011  . Penicillins    Review of Systems: See HPI,  otherwise negative ROS  Physical Exam: BP 114/63  Pulse 61  Temp 97.2 F (36.2 C) (Temporal)  Ht 5\' 6"  (1.676 m)  Wt 148 lb 6.4 oz (67.314 kg)  BMI 23.95 kg/m2  LMP 01/19/1997 General:   Alert,  Well-developed, well-nourished, pleasant and cooperative in NAD. Eyes:  Sclera clear, no icterus.   Conjunctiva pink. Mouth:  No deformity or lesions, oropharynx pink and moist. Neck:  Supple; no masses or thyromegaly. Heart:  Regular rate and rhythm; no murmurs, clicks, rubs,  or gallops. Abdomen:  Normal bowel sounds.  No bruits.  Soft, non-tender and non-distended without masses, hepatosplenomegaly or hernias noted.  No guarding or rebound tenderness.   Rectal:  Deferred Msk:  Symmetrical without gross deformities.  Pulses:  Normal pulses noted. Extremities:  No clubbing or edema. Neurologic:  Alert and oriented x4;  grossly normal neurologically. Skin:  Intact without significant lesions or rashes.

## 2011-12-02 NOTE — Assessment & Plan Note (Signed)
Begin Miralax 17 grams daily for constipation.  Hold if diarrhea. Call if not better in a few days with changes in medications.

## 2011-12-02 NOTE — Assessment & Plan Note (Signed)
She is having some transient nausea without vomiting.   Instructed to use gastroparesis step diet for severe nausea or vomiting. Increase PPI to BID Call if no better

## 2011-12-16 DIAGNOSIS — N978 Female infertility of other origin: Secondary | ICD-10-CM | POA: Diagnosis not present

## 2011-12-16 DIAGNOSIS — M810 Age-related osteoporosis without current pathological fracture: Secondary | ICD-10-CM | POA: Diagnosis not present

## 2012-01-05 DIAGNOSIS — S93409A Sprain of unspecified ligament of unspecified ankle, initial encounter: Secondary | ICD-10-CM | POA: Diagnosis not present

## 2012-03-02 ENCOUNTER — Ambulatory Visit (INDEPENDENT_AMBULATORY_CARE_PROVIDER_SITE_OTHER): Payer: Medicare Other | Admitting: Family Medicine

## 2012-03-02 ENCOUNTER — Encounter: Payer: Self-pay | Admitting: Family Medicine

## 2012-03-02 VITALS — BP 140/84 | HR 82 | Resp 16 | Ht 66.0 in | Wt 146.8 lb

## 2012-03-02 DIAGNOSIS — R5381 Other malaise: Secondary | ICD-10-CM | POA: Diagnosis not present

## 2012-03-02 DIAGNOSIS — Z79899 Other long term (current) drug therapy: Secondary | ICD-10-CM | POA: Diagnosis not present

## 2012-03-02 DIAGNOSIS — M25551 Pain in right hip: Secondary | ICD-10-CM

## 2012-03-02 DIAGNOSIS — A048 Other specified bacterial intestinal infections: Secondary | ICD-10-CM

## 2012-03-02 DIAGNOSIS — R21 Rash and other nonspecific skin eruption: Secondary | ICD-10-CM | POA: Diagnosis not present

## 2012-03-02 DIAGNOSIS — K219 Gastro-esophageal reflux disease without esophagitis: Secondary | ICD-10-CM

## 2012-03-02 DIAGNOSIS — D649 Anemia, unspecified: Secondary | ICD-10-CM

## 2012-03-02 DIAGNOSIS — J45909 Unspecified asthma, uncomplicated: Secondary | ICD-10-CM

## 2012-03-02 DIAGNOSIS — M899 Disorder of bone, unspecified: Secondary | ICD-10-CM | POA: Diagnosis not present

## 2012-03-02 DIAGNOSIS — M25559 Pain in unspecified hip: Secondary | ICD-10-CM | POA: Diagnosis not present

## 2012-03-02 DIAGNOSIS — F3289 Other specified depressive episodes: Secondary | ICD-10-CM

## 2012-03-02 DIAGNOSIS — K5900 Constipation, unspecified: Secondary | ICD-10-CM

## 2012-03-02 DIAGNOSIS — F329 Major depressive disorder, single episode, unspecified: Secondary | ICD-10-CM

## 2012-03-02 LAB — CBC WITH DIFFERENTIAL/PLATELET
Basophils Absolute: 0 10*3/uL (ref 0.0–0.1)
Basophils Relative: 1 % (ref 0–1)
Eosinophils Absolute: 0.1 10*3/uL (ref 0.0–0.7)
Eosinophils Relative: 1 % (ref 0–5)
HCT: 32.7 % — ABNORMAL LOW (ref 36.0–46.0)
MCH: 29.1 pg (ref 26.0–34.0)
MCHC: 33.3 g/dL (ref 30.0–36.0)
MCV: 87.2 fL (ref 78.0–100.0)
Monocytes Absolute: 0.6 10*3/uL (ref 0.1–1.0)
Neutro Abs: 4 10*3/uL (ref 1.7–7.7)
RDW: 14.2 % (ref 11.5–15.5)

## 2012-03-02 LAB — LIPID PANEL
HDL: 48 mg/dL (ref >39)
Total CHOL/HDL Ratio: 4.4 Ratio

## 2012-03-02 MED ORDER — ALBUTEROL SULFATE HFA 108 (90 BASE) MCG/ACT IN AERS: 2.0000 | INHALATION_SPRAY | Freq: Four times a day (QID) | RESPIRATORY_TRACT | Status: DC | PRN

## 2012-03-02 MED ORDER — DULOXETINE HCL 30 MG PO CPEP: 30.0000 mg | ORAL_CAPSULE | Freq: Every day | ORAL | Status: DC

## 2012-03-02 MED ORDER — BUDESONIDE-FORMOTEROL FUMARATE 80-4.5 MCG/ACT IN AERO: 2.0000 | INHALATION_SPRAY | Freq: Two times a day (BID) | RESPIRATORY_TRACT | Status: DC

## 2012-03-02 NOTE — Assessment & Plan Note (Signed)
Progression of rash, refer for dermatology eval

## 2012-03-02 NOTE — Assessment & Plan Note (Signed)
Chronic right hip and ankle pain treated by orthopedics, refer for continued care

## 2012-03-02 NOTE — Patient Instructions (Addendum)
Annual wellness in 4.5 month, call if you need me before.  Lipds, Vit D, cbc and iron, TSH today.  You are referred to orthopedics and dermatology as discussed.

## 2012-03-02 NOTE — Assessment & Plan Note (Signed)
Controlled, no change in medication Followed by GI 

## 2012-03-02 NOTE — Assessment & Plan Note (Signed)
Controlled, no change in medication  

## 2012-03-02 NOTE — Progress Notes (Signed)
  Subjective:    Patient ID: Tracey Daniel, female    DOB: [birth year], 32 y.o.   MRN: [id number]  HPI The PT is here for follow up and re-evaluation of chronic medical conditions, medication management and review of any available recent lab and radiology data.  Preventive health is updated, specifically  Cancer screening and Immunization.   Questions or concerns regarding consultations or procedures which the PT has had in the interim are  addressed. The PT denies any adverse reactions to current medications since the last visit.  Requests referral to orthopedics for ongoing management of chronic right hip pain and right ankle pain, has been getting 40 vicodin tabs every 10 days for years. States the pain is unbearable and she needs to keep taking the med as prescribed.Alsohad complaints re right ankle, her MRI done odes not show much pathology. C/o worsening rash on trunk and extremities, needs derm eval, no erythema or drainage from the rash    Review of Systems See HPI Denies recent fever or chills. Denies sinus pressure, nasal congestion, ear pain or sore throat. Denies chest congestion, productive cough or wheezing. Denies chest pains, palpitations and leg swelling Denies abdominal pain, nausea, vomiting,diarrhea or constipation.   Denies dysuria, frequency, hesitancy or incontinence. Denies headaches, seizures, numbness, or tingling. Denies uncontrolled  depression, anxiety or insomnia. Denies skin break down or rash.        Objective:   Physical Exam  Patient alert and oriented and in no cardiopulmonary distress.  HEENT: No facial asymmetry, EOMI, no sinus tenderness,  oropharynx pink and moist.  Neck supple no adenopathy.  Chest: Clear to auscultation bilaterally.  CVS: S1, S2 no murmurs, no S3.  ABD: Soft non tender. Bowel sounds normal.  Ext: No edema  MS: Adequate ROM spine, shoulders,  and knees.decreased ROM right hip  Skin: Intact, no ulcerations or rash  noted.  Psych: Good eye contact, normal affect. Memory intact not anxious or depressed appearing.  CNS: CN 2-12 intact, power, tone and sensation normal throughout.       Assessment & Plan:

## 2012-03-03 LAB — VITAMIN D 25 HYDROXY (VIT D DEFICIENCY, FRACTURES): Vit D, 25-Hydroxy: 23 ng/mL — ABNORMAL LOW (ref 30–89)

## 2012-03-05 ENCOUNTER — Other Ambulatory Visit: Payer: Self-pay | Admitting: Family Medicine

## 2012-03-05 DIAGNOSIS — E559 Vitamin D deficiency, unspecified: Secondary | ICD-10-CM

## 2012-03-08 ENCOUNTER — Telehealth: Payer: Self-pay | Admitting: *Deleted

## 2012-03-08 ENCOUNTER — Telehealth: Payer: Self-pay | Admitting: Family Medicine

## 2012-03-08 MED ORDER — ONDANSETRON HCL 4 MG PO TABS: 4.0000 mg | ORAL_TABLET | Freq: Three times a day (TID) | ORAL | Status: DC | PRN

## 2012-03-08 NOTE — Telephone Encounter (Signed)
Routing to refill box  

## 2012-03-08 NOTE — Telephone Encounter (Signed)
Tracey Daniel called today. She needs a refill on her Zofran. Please address. Thank you.

## 2012-03-09 NOTE — Telephone Encounter (Signed)
Patient is aware 

## 2012-03-14 DIAGNOSIS — M25559 Pain in unspecified hip: Secondary | ICD-10-CM | POA: Diagnosis not present

## 2012-03-14 DIAGNOSIS — M545 Low back pain: Secondary | ICD-10-CM | POA: Diagnosis not present

## 2012-03-19 ENCOUNTER — Other Ambulatory Visit: Payer: Self-pay

## 2012-03-19 DIAGNOSIS — E559 Vitamin D deficiency, unspecified: Secondary | ICD-10-CM

## 2012-03-19 MED ORDER — ERGOCALCIFEROL 1.25 MG (50000 UT) PO CAPS: 50000.0000 [IU] | ORAL_CAPSULE | ORAL | Status: DC

## 2012-03-28 DIAGNOSIS — M25559 Pain in unspecified hip: Secondary | ICD-10-CM | POA: Diagnosis not present

## 2012-03-29 ENCOUNTER — Encounter (HOSPITAL_COMMUNITY): Payer: Medicare Other | Attending: Oncology

## 2012-03-29 DIAGNOSIS — M899 Disorder of bone, unspecified: Secondary | ICD-10-CM | POA: Insufficient documentation

## 2012-03-29 DIAGNOSIS — K3184 Gastroparesis: Secondary | ICD-10-CM | POA: Diagnosis not present

## 2012-03-29 DIAGNOSIS — D649 Anemia, unspecified: Secondary | ICD-10-CM | POA: Diagnosis not present

## 2012-03-29 DIAGNOSIS — M949 Disorder of cartilage, unspecified: Secondary | ICD-10-CM | POA: Insufficient documentation

## 2012-03-29 LAB — CBC WITH DIFFERENTIAL/PLATELET
Basophils Absolute: 0 10*3/uL (ref 0.0–0.1)
Basophils Relative: 0 % (ref 0–1)
Eosinophils Relative: 1 % (ref 0–5)
HCT: 33.9 % — ABNORMAL LOW (ref 36.0–46.0)
MCH: 29.6 pg (ref 26.0–34.0)
MCHC: 33 g/dL (ref 30.0–36.0)
MCV: 89.4 fL (ref 78.0–100.0)
Monocytes Absolute: 0.6 10*3/uL (ref 0.1–1.0)
RDW: 13.8 % (ref 11.5–15.5)

## 2012-03-29 NOTE — Progress Notes (Signed)
Labs drawn today for cbc/diff 

## 2012-04-01 ENCOUNTER — Encounter (HOSPITAL_COMMUNITY): Payer: Self-pay | Admitting: Oncology

## 2012-04-01 ENCOUNTER — Encounter (HOSPITAL_BASED_OUTPATIENT_CLINIC_OR_DEPARTMENT_OTHER): Payer: Medicare Other | Admitting: Oncology

## 2012-04-01 VITALS — BP 126/77 | HR 89 | Temp 98.7°F | Resp 16 | Wt 145.4 lb

## 2012-04-01 DIAGNOSIS — E559 Vitamin D deficiency, unspecified: Secondary | ICD-10-CM

## 2012-04-01 DIAGNOSIS — D649 Anemia, unspecified: Secondary | ICD-10-CM | POA: Diagnosis not present

## 2012-04-01 DIAGNOSIS — M899 Disorder of bone, unspecified: Secondary | ICD-10-CM

## 2012-04-01 NOTE — Patient Instructions (Addendum)
[facility] Discharge Instructions  RECOMMENDATIONS MADE BY THE CONSULTANT AND ANY TEST RESULTS WILL BE SENT TO YOUR REFERRING PHYSICIAN.  EXAM FINDINGS BY THE PHYSICIAN TODAY AND SIGNS OR SYMPTOMS TO REPORT TO CLINIC OR PRIMARY PHYSICIAN: Exam and discussion by MD.  Your blood counts are stable.  You can start taking your iron 3 times/weekly.  MEDICATIONS PRESCRIBED:  none  INSTRUCTIONS GIVEN AND DISCUSSED: Report increased fatigue, shortness of breath, increased ice intake, etc.  SPECIAL INSTRUCTIONS/FOLLOW-UP: Blood work in November and to be seen in follow-up after results are back.  Thank you for choosing [facility] to provide your oncology and hematology care.  To afford each patient quality time with our providers, please arrive at least 15 minutes before your scheduled appointment time.  With your help, our goal is to use those 15 minutes to complete the necessary work-up to ensure our physicians have the information they need to help with your evaluation and healthcare recommendations.    Effective January 1st, 2014, we ask that you re-schedule your appointment with our physicians should you arrive 10 or more minutes late for your appointment.  We strive to give you quality time with our providers, and arriving late affects you and other patients whose appointments are after yours.    Again, thank you for choosing [facility].  Our hope is that these requests will decrease the amount of time that you wait before being seen by our physicians.       _____________________________________________________________  Should you have questions after your visit to [facility], please contact our office at [phone] between the hours of 8:30 a.m. and 5:00 p.m.  Voicemails left after 4:30 p.m. will not be returned until the following business day.  For prescription refill requests, have your pharmacy contact our office with your  prescription refill request.     [facility] Discharge Instructions  RECOMMENDATIONS MADE BY THE CONSULTANT AND ANY TEST RESULTS WILL BE SENT TO YOUR REFERRING PHYSICIAN.

## 2012-04-01 NOTE — Progress Notes (Signed)
#  1 anemia most likely secondary to multiple medical issues #2 testosterone excess with premature menopause #3 childhood asthma now not smoking x2 years #4 Osteopenia #5 history of migraines #6 history depression #7 gastroparesis #8 vitamin D deficiency #9 Botox injection for pyloric spasm  This young lady's hemoglobin is at least stable as is her white count and platelets. We will therefore continue to follow her but not as frequently. We will see her in 8 months this time.

## 2012-04-29 DIAGNOSIS — M25559 Pain in unspecified hip: Secondary | ICD-10-CM | POA: Diagnosis not present

## 2012-05-03 DIAGNOSIS — F329 Major depressive disorder, single episode, unspecified: Secondary | ICD-10-CM | POA: Diagnosis not present

## 2012-05-03 DIAGNOSIS — F29 Unspecified psychosis not due to a substance or known physiological condition: Secondary | ICD-10-CM | POA: Diagnosis not present

## 2012-05-04 ENCOUNTER — Telehealth: Payer: Self-pay | Admitting: Family Medicine

## 2012-05-04 NOTE — Telephone Encounter (Signed)
Noted  

## 2012-05-05 NOTE — Telephone Encounter (Signed)
Spoke with pt , she is to make appt to complete the part for the MD to do. Some of the paperwork she needs to complete herself before the appointment, I have explained this to her and she knows to come to collect the papers which are left in the box in the front

## 2012-05-10 NOTE — Telephone Encounter (Signed)
Patient picked up papers 4.21.14

## 2012-05-23 ENCOUNTER — Ambulatory Visit: Payer: Medicare Other | Admitting: Family Medicine

## 2012-05-25 DIAGNOSIS — F333 Major depressive disorder, recurrent, severe with psychotic symptoms: Secondary | ICD-10-CM | POA: Diagnosis not present

## 2012-06-01 DIAGNOSIS — M25559 Pain in unspecified hip: Secondary | ICD-10-CM | POA: Diagnosis not present

## 2012-06-20 DIAGNOSIS — L259 Unspecified contact dermatitis, unspecified cause: Secondary | ICD-10-CM | POA: Diagnosis not present

## 2012-06-20 DIAGNOSIS — L819 Disorder of pigmentation, unspecified: Secondary | ICD-10-CM | POA: Diagnosis not present

## 2012-06-20 DIAGNOSIS — L538 Other specified erythematous conditions: Secondary | ICD-10-CM | POA: Diagnosis not present

## 2012-06-23 DIAGNOSIS — F333 Major depressive disorder, recurrent, severe with psychotic symptoms: Secondary | ICD-10-CM | POA: Diagnosis not present

## 2012-07-13 DIAGNOSIS — M545 Low back pain: Secondary | ICD-10-CM | POA: Diagnosis not present

## 2012-07-27 ENCOUNTER — Ambulatory Visit (INDEPENDENT_AMBULATORY_CARE_PROVIDER_SITE_OTHER): Payer: Medicare Other | Admitting: Family Medicine

## 2012-07-27 ENCOUNTER — Encounter: Payer: Self-pay | Admitting: Family Medicine

## 2012-07-27 VITALS — BP 118/82 | HR 71 | Temp 97.7°F | Resp 16 | Ht 66.0 in | Wt 126.0 lb

## 2012-07-27 DIAGNOSIS — F329 Major depressive disorder, single episode, unspecified: Secondary | ICD-10-CM | POA: Diagnosis not present

## 2012-07-27 DIAGNOSIS — F3289 Other specified depressive episodes: Secondary | ICD-10-CM

## 2012-07-27 DIAGNOSIS — R112 Nausea with vomiting, unspecified: Secondary | ICD-10-CM | POA: Diagnosis not present

## 2012-07-27 DIAGNOSIS — R630 Anorexia: Secondary | ICD-10-CM | POA: Diagnosis not present

## 2012-07-27 DIAGNOSIS — J45909 Unspecified asthma, uncomplicated: Secondary | ICD-10-CM

## 2012-07-27 DIAGNOSIS — K313 Pylorospasm, not elsewhere classified: Secondary | ICD-10-CM

## 2012-07-27 MED ORDER — ONDANSETRON HCL 4 MG/2ML IJ SOLN
4.0000 mg | Freq: Once | INTRAMUSCULAR | Status: AC
  Administered 2012-07-27: 4 mg via INTRAMUSCULAR

## 2012-07-27 MED ORDER — MEGESTROL ACETATE 40 MG PO TABS: 40.0000 mg | ORAL_TABLET | Freq: Two times a day (BID) | ORAL | Status: AC
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
## 2012-07-27 NOTE — Patient Instructions (Addendum)
Annual wellness mid September.  Zofran today for nausea   Megace twice daily for appetite  Please follow through on grief counseling for dealing with your mother's death  Remember to seek extra counseling as you go through this challenging period, so far you are doing well

## 2012-08-11 ENCOUNTER — Emergency Department (HOSPITAL_COMMUNITY)
Admission: EM | Admit: 2012-08-11 | Discharge: 2012-08-11 | Disposition: A | Discharge status: Home / Self Care | Payer: Medicare Other | Attending: Emergency Medicine | Admitting: Emergency Medicine

## 2012-08-11 ENCOUNTER — Encounter (HOSPITAL_COMMUNITY): Payer: Self-pay | Admitting: Emergency Medicine

## 2012-08-11 DIAGNOSIS — Z880 Allergy status to penicillin: Secondary | ICD-10-CM | POA: Diagnosis not present

## 2012-08-11 DIAGNOSIS — G43909 Migraine, unspecified, not intractable, without status migrainosus: Secondary | ICD-10-CM | POA: Insufficient documentation

## 2012-08-11 DIAGNOSIS — G8929 Other chronic pain: Secondary | ICD-10-CM | POA: Diagnosis not present

## 2012-08-11 DIAGNOSIS — N390 Urinary tract infection, site not specified: Secondary | ICD-10-CM | POA: Diagnosis not present

## 2012-08-11 DIAGNOSIS — Z79899 Other long term (current) drug therapy: Secondary | ICD-10-CM | POA: Insufficient documentation

## 2012-08-11 DIAGNOSIS — Z8709 Personal history of other diseases of the respiratory system: Secondary | ICD-10-CM | POA: Diagnosis not present

## 2012-08-11 DIAGNOSIS — Z8739 Personal history of other diseases of the musculoskeletal system and connective tissue: Secondary | ICD-10-CM | POA: Diagnosis not present

## 2012-08-11 DIAGNOSIS — Z8639 Personal history of other endocrine, nutritional and metabolic disease: Secondary | ICD-10-CM | POA: Insufficient documentation

## 2012-08-11 DIAGNOSIS — Z862 Personal history of diseases of the blood and blood-forming organs and certain disorders involving the immune mechanism: Secondary | ICD-10-CM | POA: Insufficient documentation

## 2012-08-11 DIAGNOSIS — J449 Chronic obstructive pulmonary disease, unspecified: Secondary | ICD-10-CM | POA: Insufficient documentation

## 2012-08-11 DIAGNOSIS — R1013 Epigastric pain: Secondary | ICD-10-CM | POA: Insufficient documentation

## 2012-08-11 DIAGNOSIS — J45909 Unspecified asthma, uncomplicated: Secondary | ICD-10-CM | POA: Diagnosis not present

## 2012-08-11 DIAGNOSIS — R197 Diarrhea, unspecified: Secondary | ICD-10-CM | POA: Diagnosis not present

## 2012-08-11 DIAGNOSIS — F329 Major depressive disorder, single episode, unspecified: Secondary | ICD-10-CM | POA: Diagnosis not present

## 2012-08-11 DIAGNOSIS — Z3202 Encounter for pregnancy test, result negative: Secondary | ICD-10-CM | POA: Insufficient documentation

## 2012-08-11 DIAGNOSIS — J4489 Other specified chronic obstructive pulmonary disease: Secondary | ICD-10-CM | POA: Insufficient documentation

## 2012-08-11 DIAGNOSIS — Z87891 Personal history of nicotine dependence: Secondary | ICD-10-CM | POA: Diagnosis not present

## 2012-08-11 DIAGNOSIS — Z8719 Personal history of other diseases of the digestive system: Secondary | ICD-10-CM | POA: Diagnosis not present

## 2012-08-11 DIAGNOSIS — IMO0002 Reserved for concepts with insufficient information to code with codable children: Secondary | ICD-10-CM | POA: Diagnosis not present

## 2012-08-11 DIAGNOSIS — Z91040 Latex allergy status: Secondary | ICD-10-CM | POA: Diagnosis not present

## 2012-08-11 DIAGNOSIS — F3289 Other specified depressive episodes: Secondary | ICD-10-CM | POA: Insufficient documentation

## 2012-08-11 HISTORY — DX: Chronic obstructive pulmonary disease, unspecified: J44.9

## 2012-08-11 LAB — COMPREHENSIVE METABOLIC PANEL
ALT: 12 U/L (ref 0–35)
Albumin: 4.5 g/dL (ref 3.5–5.2)
Alkaline Phosphatase: 87 U/L (ref 39–117)
Potassium: 3.8 mEq/L (ref 3.5–5.1)
Sodium: 140 mEq/L (ref 135–145)
Total Protein: 7.7 g/dL (ref 6.0–8.3)

## 2012-08-11 LAB — URINALYSIS, ROUTINE W REFLEX MICROSCOPIC
Bilirubin Urine: NEGATIVE
Glucose, UA: NEGATIVE mg/dL
Hgb urine dipstick: NEGATIVE
Ketones, ur: NEGATIVE mg/dL
Protein, ur: NEGATIVE mg/dL

## 2012-08-11 LAB — CBC WITH DIFFERENTIAL/PLATELET
Basophils Absolute: 0 10*3/uL (ref 0.0–0.1)
Basophils Relative: 0 % (ref 0–1)
Hemoglobin: 11.7 g/dL — ABNORMAL LOW (ref 12.0–15.0)
MCHC: 32.8 g/dL (ref 30.0–36.0)
Monocytes Relative: 6 % (ref 3–12)
Neutro Abs: 7.2 10*3/uL (ref 1.7–7.7)
Neutrophils Relative %: 74 % (ref 43–77)
RDW: 13.9 % (ref 11.5–15.5)
WBC: 9.7 10*3/uL (ref 4.0–10.5)

## 2012-08-11 LAB — URINE MICROSCOPIC-ADD ON

## 2012-08-11 MED ORDER — SODIUM CHLORIDE 0.9 % IV SOLN
INTRAVENOUS | Status: DC
  Administered 2012-08-11: 14:00:00 via INTRAVENOUS

## 2012-08-11 MED ORDER — GI COCKTAIL ~~LOC~~
30.0000 mL | Freq: Once | ORAL | Status: AC
  Administered 2012-08-11: 30 mL via ORAL
  Filled 2012-08-11: qty 30

## 2012-08-11 MED ORDER — MORPHINE SULFATE 4 MG/ML IJ SOLN
4.0000 mg | Freq: Once | INTRAMUSCULAR | Status: AC
  Administered 2012-08-11: 4 mg via INTRAVENOUS
  Filled 2012-08-11: qty 1

## 2012-08-11 MED ORDER — SULFAMETHOXAZOLE-TRIMETHOPRIM 800-160 MG PO TABS: 1.0000 | ORAL_TABLET | Freq: Two times a day (BID) | ORAL | Status: DC

## 2012-08-11 MED ORDER — ONDANSETRON HCL 4 MG/2ML IJ SOLN
4.0000 mg | Freq: Once | INTRAMUSCULAR | Status: AC
  Administered 2012-08-11: 4 mg via INTRAVENOUS
  Filled 2012-08-11: qty 2

## 2012-08-11 MED ORDER — SODIUM CHLORIDE 0.9 % IV BOLUS (SEPSIS)
1000.0000 mL | Freq: Once | INTRAVENOUS | Status: AC
  Administered 2012-08-11: 1000 mL via INTRAVENOUS

## 2012-08-11 MED ORDER — ONDANSETRON HCL 4 MG PO TABS: 4.0000 mg | ORAL_TABLET | Freq: Three times a day (TID) | ORAL | Status: DC | PRN

## 2012-08-11 MED ORDER — PANTOPRAZOLE SODIUM 40 MG IV SOLR
40.0000 mg | Freq: Once | INTRAVENOUS | Status: AC
  Administered 2012-08-11: 40 mg via INTRAVENOUS
  Filled 2012-08-11: qty 40

## 2012-08-11 MED ORDER — SULFAMETHOXAZOLE-TMP DS 800-160 MG PO TABS
1.0000 | ORAL_TABLET | Freq: Once | ORAL | Status: AC
  Administered 2012-08-11: 1 via ORAL
  Filled 2012-08-11: qty 1

## 2012-08-11 NOTE — ED Notes (Signed)
Pt c/o pain coming back, requesting more medicine. MD aware. Pt also wanting to eat. Pt given crackers and a drink.

## 2012-08-11 NOTE — ED Notes (Signed)
Per pt - sts she woke up this morning feeling nauseous and vomited a few times. sts one of the times she thinks she saw bld in the emesis, sts it was a light red color. Pt sts after vomiting 20 times she started to get generalized abd pain. sts she tried to drink some sprite but then became nauseous again a little after. Pt in nad, skin warm and dry, resp e/u.

## 2012-08-11 NOTE — ED Notes (Signed)
Pt sts she went to see Dr. Lodema Hong on the 9th of this month for the same issue, and was told to follow up with a gastroenterologist. Pt sts she never made an appt with them. sts the last episode it took about 3-4 days for her to feel better. Pt sts she has hx of gastro issues.

## 2012-08-11 NOTE — ED Provider Notes (Signed)
History    CSN: [id number] Arrival date & time 08/11/12  1203  First MD Initiated Contact with Patient 08/11/12 1223     Chief Complaint  Patient presents with  . Nausea  . Emesis   (Consider location/radiation/quality/duration/timing/severity/associated sxs/prior Treatment) HPI  32 year old female with multiple comorbidities including gastric outlet obstruction, PUD, gastroparesis, gastritis presents for evaluation of abdominal pain. Patient reports gradual onset of burning pain to her epigastric region radiates to the back with associated nausea, vomiting and diarrhea. Epigastric pain is sharp and burning, worsening with vomiting. States she has vomit a more than 20 times since this morning. Vomitus is watery with occasional trace of blood. Report mild loose stools. Symptoms felt similar to priors episodes in which she normally had one to 2 times a month. She relates her symptoms to her gastroparesis.  Otherwise she denies any precipitating factors. No complaints of fever, chills, headache, chest pain, shortness of breath, dysuria, hematuria, hematochezia, or melena. Denies any recent alcohol use. Patient states she is in early menopause, and does not recall her last menstrual period. Her prior surgical history is significant for cholecystectomy, esophageal gastroduodenoscopy, and esophageal dilation.    Past Medical History  Diagnosis Date  . Asthma   . Migraines   . Osteoporosis   . Depression   . Seasonal allergies   . Sinusitis   . Back pain, chronic   . Nicotine addiction   . Constipation   . Vitamin D deficiency   . Gastritis   . Gastroparesis   . Pyloric spasm 03/30/2011  . Gastric outlet obstruction   . Peptic ulcer disease   . COPD (chronic obstructive pulmonary disease)    Past Surgical History  Procedure Laterality Date  . Cholecystectomy      ?2002  . Laser surgery on cervix    . Carpal tunnel release      left hand  . Esophagogastroduodenoscopy  12/25/2010     [name], MD;  moderate gastritis, ?goo secondary to pylorspasm. BX showed reactive gstropathy no h.pyori, SB mucosa with intramucosal lymphocytosis and partial villous blunting (TTG 4.0 normal)  . Esophageal dilation  12/25/2010    Procedure: ESOPHAGEAL DILATION;  Surgeon: [name], MD;  Location: [facility];  Service: Endoscopy;;   Family History  Problem Relation Age of Onset  . Diabetes Father   . Liver disease Father     liver transplant at [facility], age 76  . Colon cancer Neg Hx   . Lung cancer Mother    History  Substance Use Topics  . Smoking status: Former Smoker -- 1.00 packs/day for 15 years    Types: Cigarettes  . Smokeless tobacco: Not on file     Comment: quit 2011  . Alcohol Use: No     Comment: none since July. Periodic heavy drinker for months at a time.   OB History   Grav Para Term Preterm Abortions TAB SAB Ect Mult Living                 Review of Systems  All other systems reviewed and are negative.    Allergies  Latex and Penicillins  Home Medications   Current Outpatient Rx  Name  Route  Sig  Dispense  Refill  . albuterol (PROVENTIL HFA;VENTOLIN HFA) 108 (90 BASE) MCG/ACT inhaler   Inhalation   Inhale 2 puffs into the lungs every 6 (six) hours as needed. Shortness of breath   6.7 g   4   .  budesonide-formoterol (SYMBICORT) 80-4.5 MCG/ACT inhaler   Inhalation   Inhale 2 puffs into the lungs 2 (two) times daily.   1 Inhaler   4   . butalbital-acetaminophen-caffeine (FIORICET) 50-325-40 MG per tablet      One tablet once daily, as needed, for severe headache.  Ten tablets to last 30 days   10 tablet   3   . calcium carbonate 1250 MG capsule   Oral   Take 1,250 mg by mouth 2 (two) times daily with a meal.         . Cholecalciferol (VITAMIN D3) 2000 UNITS CHEW   Oral   Chew by mouth.         . cyclobenzaprine (FLEXERIL) 10 MG tablet   Oral   Take 10 mg by mouth 3 (three) times daily as needed. For muscle spasms          . DULoxetine (CYMBALTA) 30 MG capsule   Oral   Take 1 capsule (30 mg total) by mouth daily.   30 capsule   4   . ergocalciferol (VITAMIN D2) 50000 UNITS capsule   Oral   Take 1 capsule (50,000 Units total) by mouth once a week. One capsule once weekly   12 capsule   1   . estradiol (ESTRACE VAGINAL) 0.1 MG/GM vaginal cream   Vaginal   Place 2 g vaginally daily. As directed         . feeding supplement (ENSURE CLINICAL STRENGTH) LIQD   Oral   Take 237 mLs by mouth 3 (three) times daily with meals.   10 Bottle   3   . ferrous sulfate 325 (65 FE) MG tablet   Oral   Take 325 mg by mouth daily with breakfast.         . flintstones complete (FLINTSTONES) 60 MG chewable tablet   Oral   Chew 1 tablet by mouth daily.         . fluticasone (CUTIVATE) 0.05 % cream   Topical   Apply 1 application topically 2 (two) times daily.         Marland Kitchen HYDROcodone-acetaminophen (NORCO) 7.5-325 MG per tablet   Oral   Take 1 tablet by mouth every 4 (four) hours as needed. For pain         . loratadine (CLARITIN) 10 MG tablet   Oral   Take 1 tablet (10 mg total) by mouth daily.   30 tablet   5   . megestrol (MEGACE) 40 MG tablet   Oral   Take 1 tablet (40 mg total) by mouth 2 (two) times daily.   30 tablet   2   . ondansetron (ZOFRAN) 4 MG tablet   Oral   Take 1 tablet (4 mg total) by mouth every 8 (eight) hours as needed.   30 tablet   0   . pantoprazole (PROTONIX) 40 MG tablet   Oral   Take 1 tablet (40 mg total) by mouth 2 (two) times daily.   60 tablet   5   . polyethylene glycol powder (MIRALAX) powder   Oral   Take 17 g by mouth daily.   527 g   11    BP 136/86  Temp(Src) 98.2 F (36.8 C) (Oral)  Resp 18  SpO2 100%  LMP 01/19/1997 Physical Exam  Nursing note and vitals reviewed. Constitutional: She is oriented to person, place, and time. She appears well-developed and well-nourished. She appears distressed (Uncomfortable appearing, in fetal  position).  HENT:  Head: Atraumatic.  Mouth/Throat: Oropharynx is clear and moist.  Eyes: Conjunctivae are normal.  Neck: Neck supple.  Cardiovascular: Normal rate and regular rhythm.   Pulmonary/Chest: Effort normal and breath sounds normal.  Abdominal: She exhibits no distension. There is tenderness (epigastric tenderness without guarding or rebound tenderness. Abdomen is nondistended).  Neurological: She is alert and oriented to person, place, and time.  Skin: Skin is warm. No rash noted.  Psychiatric: She has a normal mood and affect.    ED Course  Procedures (including critical care time)  12:38 PM Acute on chronic abdominal pain associated with gastroparesis. Patient is uncomfortable appearing however is afebrile with stable normal vital signs. Nonsurgical abdomen. Doubt Mallory Weiss tear, esophageal perforation, cardio/pulmonary disease. We'll treat her symptoms, and we'll obtain basic labs.    2:28 PM Pt positive for uti.  Will treat with bactrim.  Pt now able to tolerates PO, feels better, stable for discharge.  No CVA tenderness or fever to suggest pyelonephritis.  Return precaution discussed.    Labs Reviewed  URINALYSIS, ROUTINE W REFLEX MICROSCOPIC - Abnormal; Notable for the following:    APPearance CLOUDY (*)    Nitrite POSITIVE (*)    All other components within normal limits  CBC WITH DIFFERENTIAL - Abnormal; Notable for the following:    Hemoglobin 11.7 (*)    HCT 35.7 (*)    All other components within normal limits  URINE MICROSCOPIC-ADD ON - Abnormal; Notable for the following:    Bacteria, UA MANY (*)    All other components within normal limits  PREGNANCY, URINE  LIPASE, BLOOD  COMPREHENSIVE METABOLIC PANEL   No results found. 1. UTI (lower urinary tract infection)     MDM  BP 138/91  Pulse 76  Temp(Src) 98.2 F (36.8 C) (Oral)  Resp 18  SpO2 100%  LMP 01/19/1997   [name], PA-C 08/11/12 1444

## 2012-08-11 NOTE — ED Provider Notes (Signed)
Medical screening examination/treatment/procedure(s) were performed by non-physician practitioner and as supervising physician I was immediately available for consultation/collaboration.  Lyanne Co, MD 08/11/12 [phone]

## 2012-08-11 NOTE — ED Notes (Signed)
Pharmacy at bedside. Pt asked for urine sample and given specimen cup.

## 2012-08-15 DIAGNOSIS — M545 Low back pain: Secondary | ICD-10-CM | POA: Diagnosis not present

## 2012-08-22 DIAGNOSIS — F333 Major depressive disorder, recurrent, severe with psychotic symptoms: Secondary | ICD-10-CM | POA: Diagnosis not present

## 2012-09-03 DIAGNOSIS — R112 Nausea with vomiting, unspecified: Secondary | ICD-10-CM | POA: Insufficient documentation
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
## 2012-09-03 NOTE — Assessment & Plan Note (Signed)
Chronic nausea recent exaccerbation due to increased personal stress and depression, zofran in office

## 2012-09-03 NOTE — Assessment & Plan Note (Signed)
Controlled, no change in medication No recent flare 

## 2012-09-03 NOTE — Assessment & Plan Note (Signed)
Significant weight loss due to poor appetite and chronic GI symptoms. Pt under increased personal stress, will give megace short term

## 2012-09-03 NOTE — Assessment & Plan Note (Signed)
zofran administered in office

## 2012-09-14 DIAGNOSIS — M5137 Other intervertebral disc degeneration, lumbosacral region: Secondary | ICD-10-CM | POA: Diagnosis not present

## 2012-09-14 DIAGNOSIS — M545 Low back pain: Secondary | ICD-10-CM | POA: Diagnosis not present

## 2012-09-14 DIAGNOSIS — F17200 Nicotine dependence, unspecified, uncomplicated: Secondary | ICD-10-CM | POA: Diagnosis not present

## 2012-09-26 DIAGNOSIS — L538 Other specified erythematous conditions: Secondary | ICD-10-CM | POA: Diagnosis not present

## 2012-10-05 ENCOUNTER — Encounter: Payer: Medicare Other | Admitting: Family Medicine

## 2012-10-12 DIAGNOSIS — F17200 Nicotine dependence, unspecified, uncomplicated: Secondary | ICD-10-CM | POA: Diagnosis not present

## 2012-10-12 DIAGNOSIS — M5137 Other intervertebral disc degeneration, lumbosacral region: Secondary | ICD-10-CM | POA: Diagnosis not present

## 2012-10-12 DIAGNOSIS — M25559 Pain in unspecified hip: Secondary | ICD-10-CM | POA: Diagnosis not present

## 2012-10-12 DIAGNOSIS — M545 Low back pain: Secondary | ICD-10-CM | POA: Diagnosis not present

## 2012-10-13 DIAGNOSIS — F333 Major depressive disorder, recurrent, severe with psychotic symptoms: Secondary | ICD-10-CM | POA: Diagnosis not present

## 2012-11-09 ENCOUNTER — Encounter (INDEPENDENT_AMBULATORY_CARE_PROVIDER_SITE_OTHER): Payer: Self-pay

## 2012-11-09 ENCOUNTER — Encounter: Payer: Self-pay | Admitting: Gastroenterology

## 2012-11-09 ENCOUNTER — Ambulatory Visit (INDEPENDENT_AMBULATORY_CARE_PROVIDER_SITE_OTHER): Payer: Medicare Other | Admitting: Gastroenterology

## 2012-11-09 VITALS — BP 106/70 | HR 69 | Temp 97.4°F | Ht 66.0 in | Wt 132.2 lb

## 2012-11-09 DIAGNOSIS — M25579 Pain in unspecified ankle and joints of unspecified foot: Secondary | ICD-10-CM | POA: Diagnosis not present

## 2012-11-09 DIAGNOSIS — K311 Adult hypertrophic pyloric stenosis: Secondary | ICD-10-CM

## 2012-11-09 DIAGNOSIS — R110 Nausea: Secondary | ICD-10-CM

## 2012-11-09 DIAGNOSIS — R1013 Epigastric pain: Secondary | ICD-10-CM

## 2012-11-09 DIAGNOSIS — R634 Abnormal weight loss: Secondary | ICD-10-CM

## 2012-11-09 DIAGNOSIS — F17200 Nicotine dependence, unspecified, uncomplicated: Secondary | ICD-10-CM | POA: Diagnosis not present

## 2012-11-09 DIAGNOSIS — K219 Gastro-esophageal reflux disease without esophagitis: Secondary | ICD-10-CM

## 2012-11-09 DIAGNOSIS — K3184 Gastroparesis: Secondary | ICD-10-CM

## 2012-11-09 DIAGNOSIS — M5137 Other intervertebral disc degeneration, lumbosacral region: Secondary | ICD-10-CM | POA: Diagnosis not present

## 2012-11-09 DIAGNOSIS — M25559 Pain in unspecified hip: Secondary | ICD-10-CM | POA: Diagnosis not present

## 2012-11-09 MED ORDER — PROMETHAZINE HCL 25 MG PO TABS: 25.0000 mg | ORAL_TABLET | Freq: Four times a day (QID) | ORAL | Status: DC | PRN

## 2012-11-09 NOTE — Patient Instructions (Addendum)
1. We have scheduled you for an upper endoscopy with Dr. Jena Gauss. Please see separate instructions. 2. In the meantime, please consume soft foods. Eat small quantities multiple times daily. If you develop uncontrollable pain or vomiting, you should present to the emergency department. 3. You may use Zofran for nausea. I have also provided you with Phenergan for nausea if needed. RX sent directly to your pharmacy.

## 2012-11-09 NOTE — Assessment & Plan Note (Addendum)
Recurrent epigastric pain associated with nausea, weight loss. Symptoms brought on by meals. Significant history of pylorospasm producing gastric outlet obstruction status post Botox injections in 2012. Last EGD in 2012. Treated for H. pylori (positive serologies) in October 2012. She may have recurrent pylorospasm but other etiologies need to be considered as it is been 2 years since her last procedure. Would offer upper endoscopy locally/semi urgently for diagnostic purposes. In the interim if her pain becomes uncontrollable or she develop intractable vomiting she should go directly to emergency department. She'll consume soft foods in small quantities until her endoscopy. Continue PPI twice a day.  I have discussed the risks, alternatives, benefits with regards to but not limited to the risk of reaction to medication, bleeding, infection, perforation and the patient is agreeable to proceed. Written consent to be obtained.  Augment conscious sedation with Phenergan 25mg  IV 30 minutes before procedure for h/o polypharmacy.

## 2012-11-09 NOTE — Progress Notes (Signed)
Primary Care Physician: Syliva Overman, MD  Primary Gastroenterologist:  Roetta Sessions, MD   Chief Complaint  Patient presents with  . Abdominal Pain    HPI: Tracey Daniel is a 32 y.o. female here for urgent OV for abdominal pain. Last seen November 2013. History of GERD, constipation, pylorospasm, gastroparesis. Pylorospasm causing gastric outlet obstruction status post Botox injection December 2012, Dr.Koch. She is followed by hematologist for anemia, appt for f/u next month. She weighed 148 pounds one year ago. Down to 132 pounds today.  Symptoms started back couple of months ago. Initially intermittent but now more constant. PP epigastric pain regardless of quantity or what she eats or drinks. No vomiting but nausea. No significant heartburn now, had some about one month ago. Lost down to about 118 pounds. Dr. Lodema Hong gave her a short course of Megace. Patient states she did gain back some weight but is going back down again. BM OK, 3-4 X per week. No melena, brbpr. Some nocturnal pain as well. Severe at times. Pain-free for a couple hours at a time. Feels like spasms again. Hydrocodone helps pain some, takes it for back pain.  Current Outpatient Prescriptions  Medication Sig Dispense Refill  . albuterol (PROVENTIL HFA;VENTOLIN HFA) 108 (90 BASE) MCG/ACT inhaler Inhale 2 puffs into the lungs every 6 (six) hours as needed. Shortness of breath  6.7 g  4  . budesonide-formoterol (SYMBICORT) 80-4.5 MCG/ACT inhaler Inhale 2 puffs into the lungs 2 (two) times daily.  1 Inhaler  4  . calcium carbonate 1250 MG capsule Take 1,250 mg by mouth 2 (two) times daily with a meal.      . Cholecalciferol (VITAMIN D3) 2000 UNITS CHEW Chew by mouth.      . cyclobenzaprine (FLEXERIL) 10 MG tablet Take 10 mg by mouth 3 (three) times daily as needed. For muscle spasms      . DULoxetine (CYMBALTA) 30 MG capsule Take 1 capsule (30 mg total) by mouth daily.  30 capsule  4  . ergocalciferol (VITAMIN D2) 50000  UNITS capsule Take 1 capsule (50,000 Units total) by mouth once a week. One capsule once weekly  12 capsule  1  . estradiol (ESTRACE VAGINAL) 0.1 MG/GM vaginal cream Place 2 g vaginally daily. As directed      . feeding supplement (ENSURE CLINICAL STRENGTH) LIQD Take 237 mLs by mouth 3 (three) times daily with meals.  10 Bottle  3  . ferrous sulfate 325 (65 FE) MG tablet Take 325 mg by mouth daily with breakfast.      . flintstones complete (FLINTSTONES) 60 MG chewable tablet Chew 1 tablet by mouth daily.      . fluticasone (CUTIVATE) 0.05 % cream Apply 1 application topically 2 (two) times daily.      Marland Kitchen HYDROcodone-acetaminophen (NORCO) 7.5-325 MG per tablet Take 1 tablet by mouth every 4 (four) hours as needed. For pain      . loratadine (CLARITIN) 10 MG tablet Take 1 tablet (10 mg total) by mouth daily.  30 tablet  5  . ondansetron (ZOFRAN) 4 MG tablet Take 1 tablet (4 mg total) by mouth every 8 (eight) hours as needed.  20 tablet  0  . pantoprazole (PROTONIX) 40 MG tablet Take 1 tablet (40 mg total) by mouth 2 (two) times daily.  60 tablet  5  . promethazine (PHENERGAN) 25 MG tablet Take 1 tablet (25 mg total) by mouth every 6 (six) hours as needed for nausea.  30 tablet  0  No current facility-administered medications for this visit.    Allergies as of 11/09/2012 - Review Complete 11/09/2012  Allergen Reaction Noted  . Latex Itching and Other (See Comments) 11/30/2011  . Penicillins     Past Medical History  Diagnosis Date  . Asthma   . Migraines   . Osteoporosis   . Depression   . Seasonal allergies   . Sinusitis   . Back pain, chronic   . Nicotine addiction   . Constipation   . Vitamin D deficiency   . Gastritis   . Gastroparesis   . Pyloric spasm 03/30/2011  . Gastric outlet obstruction   . Peptic ulcer disease   . COPD (chronic obstructive pulmonary disease)    Past Surgical History  Procedure Laterality Date  . Cholecystectomy      ?2002  . Laser surgery on cervix     . Carpal tunnel release      left hand  . Esophagogastroduodenoscopy  12/25/2010    [name], MD;  moderate gastritis, ?goo secondary to pylorspasm. BX showed reactive gstropathy no h.pyori, SB mucosa with intramucosal lymphocytosis and partial villous blunting (TTG 4.0 normal)  . Esophageal dilation  12/25/2010    Procedure: ESOPHAGEAL DILATION;  Surgeon: [name], MD;  Location: [facility];  Service: Endoscopy;;   Family History  Problem Relation Age of Onset  . Diabetes Father   . Liver disease Father     liver transplant at [place], age 30  . Colon cancer Neg Hx   . Lung cancer Mother    History   Social History  . Marital Status: Single    Spouse Name: N/A    Number of Children: 0  . Years of Education: N/A   Occupational History  . disability    Social History Main Topics  . Smoking status: Former Smoker -- 1.00 packs/day for 15 years    Types: Cigarettes  . Smokeless tobacco: None     Comment: quit 2011  . Alcohol Use: No     Comment: none since July. Periodic heavy drinker for months at a time.  . Drug Use: No  . Sexual Activity: None   Other Topics Concern  . None   Social History Narrative  . None    ROS:  General: Negative for fever, chills, fatigue, weakness. Positive for anorexia and weight loss ENT: Negative for hoarseness, difficulty swallowing , nasal congestion. CV: Negative for chest pain, angina, palpitations, dyspnea on exertion, peripheral edema.  Respiratory: Negative for dyspnea at rest, dyspnea on exertion, cough, sputum, wheezing.  GI: See history of present illness. GU:  Negative for dysuria, hematuria, urinary incontinence, urinary frequency, nocturnal urination.  Endo: See history of present illness   Physical Examination:   BP 106/70  Pulse 69  Temp(Src) 97.4 F (36.3 C) (Oral)  Ht 5\' 6"  (1.676 m)  Wt 132 lb 3.2 oz (59.966 kg)  BMI 21.35 kg/m2  LMP 01/19/1997  General: Well-nourished, well-developed. Appears  uncomfortable but no acute distress.  Eyes: No icterus. Mouth: Oropharyngeal mucosa moist and pink , no lesions erythema or exudate. Lungs: Clear to auscultation bilaterally.  Heart: Regular rate and rhythm, no murmurs rubs or gallops.  Abdomen: Bowel sounds are normal, moderate epigastric tenderness, nondistended, no hepatosplenomegaly or masses, no abdominal bruits or hernia , no rebound or guarding.   Extremities: No lower extremity edema. No clubbing or deformities. Neuro: Alert and oriented x 4   Skin: Warm and dry, no jaundice.  Psych: Alert and cooperative, normal mood and affect.  Labs:  Lab Results  Component Value Date   WBC 9.7 08/11/2012   HGB 11.7* 08/11/2012   HCT 35.7* 08/11/2012   MCV 90.4 08/11/2012   PLT 192 08/11/2012   Lab Results  Component Value Date   CREATININE 0.72 08/11/2012   BUN 16 08/11/2012   NA 140 08/11/2012   K 3.8 08/11/2012   CL 103 08/11/2012   CO2 27 08/11/2012   Lab Results  Component Value Date   ALT 12 08/11/2012   AST 18 08/11/2012   ALKPHOS 87 08/11/2012   BILITOT 0.3 08/11/2012   Lab Results  Component Value Date   LIPASE 32 08/11/2012   Lab Results  Component Value Date   TSH 0.950 03/02/2012    Imaging Studies: No results found.

## 2012-11-10 NOTE — Progress Notes (Signed)
cc'd to pcp 

## 2012-11-14 ENCOUNTER — Encounter (HOSPITAL_COMMUNITY): Payer: Self-pay | Admitting: *Deleted

## 2012-11-14 ENCOUNTER — Encounter (HOSPITAL_COMMUNITY)
Admission: RE | Disposition: A | Discharge status: Home / Self Care | Payer: Self-pay | Source: Ambulatory Visit | Attending: Internal Medicine

## 2012-11-14 ENCOUNTER — Ambulatory Visit (HOSPITAL_COMMUNITY)
Admission: RE | Admit: 2012-11-14 | Discharge: 2012-11-14 | Disposition: A | Discharge status: Home / Self Care | Payer: Medicare Other | Source: Ambulatory Visit | Attending: Family Medicine | Admitting: Family Medicine

## 2012-11-14 ENCOUNTER — Encounter: Payer: Self-pay | Admitting: Family Medicine

## 2012-11-14 ENCOUNTER — Ambulatory Visit (HOSPITAL_COMMUNITY)
Admission: RE | Admit: 2012-11-14 | Discharge: 2012-11-14 | Disposition: A | Discharge status: Home / Self Care | Payer: Medicare Other | Source: Ambulatory Visit | Attending: Internal Medicine | Admitting: Internal Medicine

## 2012-11-14 ENCOUNTER — Telehealth: Payer: Self-pay | Admitting: Family Medicine

## 2012-11-14 ENCOUNTER — Ambulatory Visit (INDEPENDENT_AMBULATORY_CARE_PROVIDER_SITE_OTHER): Payer: Medicare Other | Admitting: Family Medicine

## 2012-11-14 VITALS — BP 124/82 | HR 84 | Resp 16 | Ht 66.0 in | Wt 136.0 lb

## 2012-11-14 DIAGNOSIS — F1290 Cannabis use, unspecified, uncomplicated: Secondary | ICD-10-CM

## 2012-11-14 DIAGNOSIS — Z23 Encounter for immunization: Secondary | ICD-10-CM

## 2012-11-14 DIAGNOSIS — K219 Gastro-esophageal reflux disease without esophagitis: Secondary | ICD-10-CM

## 2012-11-14 DIAGNOSIS — M25559 Pain in unspecified hip: Secondary | ICD-10-CM

## 2012-11-14 DIAGNOSIS — J45909 Unspecified asthma, uncomplicated: Secondary | ICD-10-CM

## 2012-11-14 DIAGNOSIS — R634 Abnormal weight loss: Secondary | ICD-10-CM

## 2012-11-14 DIAGNOSIS — F17200 Nicotine dependence, unspecified, uncomplicated: Secondary | ICD-10-CM

## 2012-11-14 DIAGNOSIS — J309 Allergic rhinitis, unspecified: Secondary | ICD-10-CM

## 2012-11-14 DIAGNOSIS — K449 Diaphragmatic hernia without obstruction or gangrene: Secondary | ICD-10-CM

## 2012-11-14 DIAGNOSIS — E559 Vitamin D deficiency, unspecified: Secondary | ICD-10-CM

## 2012-11-14 DIAGNOSIS — K2970 Gastritis, unspecified, without bleeding: Secondary | ICD-10-CM | POA: Diagnosis not present

## 2012-11-14 DIAGNOSIS — Z0000 Encounter for general adult medical examination without abnormal findings: Secondary | ICD-10-CM | POA: Diagnosis not present

## 2012-11-14 DIAGNOSIS — R0989 Other specified symptoms and signs involving the circulatory and respiratory systems: Secondary | ICD-10-CM | POA: Diagnosis not present

## 2012-11-14 DIAGNOSIS — J4489 Other specified chronic obstructive pulmonary disease: Secondary | ICD-10-CM | POA: Insufficient documentation

## 2012-11-14 DIAGNOSIS — J302 Other seasonal allergic rhinitis: Secondary | ICD-10-CM

## 2012-11-14 DIAGNOSIS — R110 Nausea: Secondary | ICD-10-CM

## 2012-11-14 DIAGNOSIS — R1013 Epigastric pain: Secondary | ICD-10-CM | POA: Diagnosis not present

## 2012-11-14 DIAGNOSIS — K311 Adult hypertrophic pyloric stenosis: Secondary | ICD-10-CM

## 2012-11-14 DIAGNOSIS — K2940 Chronic atrophic gastritis without bleeding: Secondary | ICD-10-CM | POA: Insufficient documentation

## 2012-11-14 DIAGNOSIS — E348 Other specified endocrine disorders: Secondary | ICD-10-CM

## 2012-11-14 DIAGNOSIS — J449 Chronic obstructive pulmonary disease, unspecified: Secondary | ICD-10-CM | POA: Diagnosis not present

## 2012-11-14 DIAGNOSIS — R05 Cough: Secondary | ICD-10-CM | POA: Diagnosis not present

## 2012-11-14 DIAGNOSIS — R933 Abnormal findings on diagnostic imaging of other parts of digestive tract: Secondary | ICD-10-CM

## 2012-11-14 DIAGNOSIS — F1210 Cannabis abuse, uncomplicated: Secondary | ICD-10-CM

## 2012-11-14 DIAGNOSIS — M25551 Pain in right hip: Secondary | ICD-10-CM

## 2012-11-14 DIAGNOSIS — K3184 Gastroparesis: Secondary | ICD-10-CM

## 2012-11-14 HISTORY — PX: ESOPHAGOGASTRODUODENOSCOPY: SHX5428

## 2012-11-14 SURGERY — EGD (ESOPHAGOGASTRODUODENOSCOPY)
Anesthesia: Moderate Sedation

## 2012-11-14 MED ORDER — BUDESONIDE-FORMOTEROL FUMARATE 80-4.5 MCG/ACT IN AERO: 2.0000 | INHALATION_SPRAY | Freq: Two times a day (BID) | RESPIRATORY_TRACT | Status: DC

## 2012-11-14 MED ORDER — ONDANSETRON HCL 4 MG/2ML IJ SOLN
INTRAMUSCULAR | Status: AC
  Filled 2012-11-14: qty 2

## 2012-11-14 MED ORDER — MEPERIDINE HCL 100 MG/ML IJ SOLN
INTRAMUSCULAR | Status: AC
  Filled 2012-11-14: qty 2

## 2012-11-14 MED ORDER — PROMETHAZINE HCL 25 MG/ML IJ SOLN
25.0000 mg | Freq: Once | INTRAMUSCULAR | Status: AC
  Administered 2012-11-14: 25 mg via INTRAVENOUS

## 2012-11-14 MED ORDER — ALBUTEROL SULFATE HFA 108 (90 BASE) MCG/ACT IN AERS: 2.0000 | INHALATION_SPRAY | Freq: Four times a day (QID) | RESPIRATORY_TRACT | Status: DC | PRN

## 2012-11-14 MED ORDER — PROMETHAZINE HCL 25 MG/ML IJ SOLN
INTRAMUSCULAR | Status: AC
  Filled 2012-11-14: qty 1

## 2012-11-14 MED ORDER — MIDAZOLAM HCL 5 MG/5ML IJ SOLN
INTRAMUSCULAR | Status: DC | PRN
  Administered 2012-11-14: 2 mg via INTRAVENOUS
  Administered 2012-11-14 (×3): 1 mg via INTRAVENOUS

## 2012-11-14 MED ORDER — SODIUM CHLORIDE 0.9 % IJ SOLN
INTRAMUSCULAR | Status: AC
  Filled 2012-11-14: qty 10

## 2012-11-14 MED ORDER — ERGOCALCIFEROL 1.25 MG (50000 UT) PO CAPS: 50000.0000 [IU] | ORAL_CAPSULE | ORAL | Status: DC

## 2012-11-14 MED ORDER — FLUTICASONE PROPIONATE 50 MCG/ACT NA SUSP: 2.0000 | Freq: Every day | NASAL | Status: DC

## 2012-11-14 MED ORDER — PREDNISONE (PAK) 5 MG PO TABS: 5.0000 mg | ORAL_TABLET | ORAL | Status: DC

## 2012-11-14 MED ORDER — MEPERIDINE HCL 100 MG/ML IJ SOLN
INTRAMUSCULAR | Status: DC | PRN
  Administered 2012-11-14: 25 mg via INTRAVENOUS
  Administered 2012-11-14: 50 mg via INTRAVENOUS
  Administered 2012-11-14: 25 mg via INTRAVENOUS

## 2012-11-14 MED ORDER — SODIUM CHLORIDE 0.9 % IV SOLN
INTRAVENOUS | Status: DC
  Administered 2012-11-14: 14:00:00 via INTRAVENOUS

## 2012-11-14 MED ORDER — CALCIUM CARBONATE-VITAMIN D 500-200 MG-UNIT PO TABS: 1.0000 | ORAL_TABLET | Freq: Two times a day (BID) | ORAL | Status: DC

## 2012-11-14 MED ORDER — MIDAZOLAM HCL 5 MG/5ML IJ SOLN
INTRAMUSCULAR | Status: AC
  Filled 2012-11-14: qty 10

## 2012-11-14 MED ORDER — STERILE WATER FOR IRRIGATION IR SOLN
Status: DC | PRN
  Administered 2012-11-14: 15:00:00

## 2012-11-14 MED ORDER — ONDANSETRON HCL 4 MG/2ML IJ SOLN
INTRAMUSCULAR | Status: DC | PRN
  Administered 2012-11-14: 4 mg via INTRAVENOUS

## 2012-11-14 NOTE — H&P (View-Only) (Signed)
Primary Care Physician: Margaret Simpson, MD  Primary Gastroenterologist:  Michael Rourk, MD   Chief Complaint  Patient presents with  . Abdominal Pain    HPI: Tracey Daniel is a 32 y.o. female here for urgent OV for abdominal pain. Last seen November 2013. History of GERD, constipation, pylorospasm, gastroparesis. Pylorospasm causing gastric outlet obstruction status post Botox injection December 2012, Dr.Koch. She is followed by hematologist for anemia, appt for f/u next month. She weighed 148 pounds one year ago. Down to 132 pounds today.  Symptoms started back couple of months ago. Initially intermittent but now more constant. PP epigastric pain regardless of quantity or what she eats or drinks. No vomiting but nausea. No significant heartburn now, had some about one month ago. Lost down to about 118 pounds. Dr. Simpson gave her a short course of Megace. Patient states she did gain back some weight but is going back down again. BM OK, 3-4 X per week. No melena, brbpr. Some nocturnal pain as well. Severe at times. Pain-free for a couple hours at a time. Feels like spasms again. Hydrocodone helps pain some, takes it for back pain.  Current Outpatient Prescriptions  Medication Sig Dispense Refill  . albuterol (PROVENTIL HFA;VENTOLIN HFA) 108 (90 BASE) MCG/ACT inhaler Inhale 2 puffs into the lungs every 6 (six) hours as needed. Shortness of breath  6.7 g  4  . budesonide-formoterol (SYMBICORT) 80-4.5 MCG/ACT inhaler Inhale 2 puffs into the lungs 2 (two) times daily.  1 Inhaler  4  . calcium carbonate 1250 MG capsule Take 1,250 mg by mouth 2 (two) times daily with a meal.      . Cholecalciferol (VITAMIN D3) 2000 UNITS CHEW Chew by mouth.      . cyclobenzaprine (FLEXERIL) 10 MG tablet Take 10 mg by mouth 3 (three) times daily as needed. For muscle spasms      . DULoxetine (CYMBALTA) 30 MG capsule Take 1 capsule (30 mg total) by mouth daily.  30 capsule  4  . ergocalciferol (VITAMIN D2) 50000  UNITS capsule Take 1 capsule (50,000 Units total) by mouth once a week. One capsule once weekly  12 capsule  1  . estradiol (ESTRACE VAGINAL) 0.1 MG/GM vaginal cream Place 2 g vaginally daily. As directed      . feeding supplement (ENSURE CLINICAL STRENGTH) LIQD Take 237 mLs by mouth 3 (three) times daily with meals.  10 Bottle  3  . ferrous sulfate 325 (65 FE) MG tablet Take 325 mg by mouth daily with breakfast.      . flintstones complete (FLINTSTONES) 60 MG chewable tablet Chew 1 tablet by mouth daily.      . fluticasone (CUTIVATE) 0.05 % cream Apply 1 application topically 2 (two) times daily.      . HYDROcodone-acetaminophen (NORCO) 7.5-325 MG per tablet Take 1 tablet by mouth every 4 (four) hours as needed. For pain      . loratadine (CLARITIN) 10 MG tablet Take 1 tablet (10 mg total) by mouth daily.  30 tablet  5  . ondansetron (ZOFRAN) 4 MG tablet Take 1 tablet (4 mg total) by mouth every 8 (eight) hours as needed.  20 tablet  0  . pantoprazole (PROTONIX) 40 MG tablet Take 1 tablet (40 mg total) by mouth 2 (two) times daily.  60 tablet  5  . promethazine (PHENERGAN) 25 MG tablet Take 1 tablet (25 mg total) by mouth every 6 (six) hours as needed for nausea.  30 tablet  0     No current facility-administered medications for this visit.    Allergies as of 11/09/2012 - Review Complete 11/09/2012  Allergen Reaction Noted  . Latex Itching and Other (See Comments) 11/30/2011  . Penicillins     Past Medical History  Diagnosis Date  . Asthma   . Migraines   . Osteoporosis   . Depression   . Seasonal allergies   . Sinusitis   . Back pain, chronic   . Nicotine addiction   . Constipation   . Vitamin D deficiency   . Gastritis   . Gastroparesis   . Pyloric spasm 03/30/2011  . Gastric outlet obstruction   . Peptic ulcer disease   . COPD (chronic obstructive pulmonary disease)    Past Surgical History  Procedure Laterality Date  . Cholecystectomy      ?2002  . Laser surgery on cervix     . Carpal tunnel release      left hand  . Esophagogastroduodenoscopy  12/25/2010    Sandi M Fields, MD;  moderate gastritis, ?goo secondary to pylorspasm. BX showed reactive gstropathy no h.pyori, SB mucosa with intramucosal lymphocytosis and partial villous blunting (TTG 4.0 normal)  . Esophageal dilation  12/25/2010    Procedure: ESOPHAGEAL DILATION;  Surgeon: Sandi M Fields, MD;  Location: [facility];  Service: Endoscopy;;   Family History  Problem Relation Age of Onset  . Diabetes Father   . Liver disease Father     liver transplant at [facility], age 65  . Colon cancer Neg Hx   . Lung cancer Mother    History   Social History  . Marital Status: Single    Spouse Name: N/A    Number of Children: 0  . Years of Education: N/A   Occupational History  . disability    Social History Main Topics  . Smoking status: Former Smoker -- 1.00 packs/day for 15 years    Types: Cigarettes  . Smokeless tobacco: None     Comment: quit 2011  . Alcohol Use: No     Comment: none since July. Periodic heavy drinker for months at a time.  . Drug Use: No  . Sexual Activity: None   Other Topics Concern  . None   Social History Narrative  . None    ROS:  General: Negative for fever, chills, fatigue, weakness. Positive for anorexia and weight loss ENT: Negative for hoarseness, difficulty swallowing , nasal congestion. CV: Negative for chest pain, angina, palpitations, dyspnea on exertion, peripheral edema.  Respiratory: Negative for dyspnea at rest, dyspnea on exertion, cough, sputum, wheezing.  GI: See history of present illness. GU:  Negative for dysuria, hematuria, urinary incontinence, urinary frequency, nocturnal urination.  Endo: See history of present illness   Physical Examination:   BP 106/70  Pulse 69  Temp(Src) 97.4 F (36.3 C) (Oral)  Ht 5' 6" (1.676 m)  Wt 132 lb 3.2 oz (59.966 kg)  BMI 21.35 kg/m2  LMP 01/19/1997  General: Well-nourished, well-developed. Appears  uncomfortable but no acute distress.  Eyes: No icterus. Mouth: Oropharyngeal mucosa moist and pink , no lesions erythema or exudate. Lungs: Clear to auscultation bilaterally.  Heart: Regular rate and rhythm, no murmurs rubs or gallops.  Abdomen: Bowel sounds are normal, moderate epigastric tenderness, nondistended, no hepatosplenomegaly or masses, no abdominal bruits or hernia , no rebound or guarding.   Extremities: No lower extremity edema. No clubbing or deformities. Neuro: Alert and oriented x 4   Skin: Warm and dry, no jaundice.     Psych: Alert and cooperative, normal mood and affect.  Labs:  Lab Results  Component Value Date   WBC 9.7 08/11/2012   HGB 11.7* 08/11/2012   HCT 35.7* 08/11/2012   MCV 90.4 08/11/2012   PLT 192 08/11/2012   Lab Results  Component Value Date   CREATININE 0.72 08/11/2012   BUN 16 08/11/2012   NA 140 08/11/2012   K 3.8 08/11/2012   CL 103 08/11/2012   CO2 27 08/11/2012   Lab Results  Component Value Date   ALT 12 08/11/2012   AST 18 08/11/2012   ALKPHOS 87 08/11/2012   BILITOT 0.3 08/11/2012   Lab Results  Component Value Date   LIPASE 32 08/11/2012   Lab Results  Component Value Date   TSH 0.950 03/02/2012    Imaging Studies: No results found.      

## 2012-11-14 NOTE — Progress Notes (Signed)
Subjective:    Patient ID: Tracey Daniel, female    DOB: 01-13-1981, 32 y.o.   MRN: [id number]  HPI Preventive Screening-Counseling & Management   Patient present here today for a Medicare annual wellness visit. Also has c/o recent excessive post nasal drainage which is clear, no fever or chills, states allergies are uncontrolled and she has not been taking any allergy meds though prescribed, states no money availble but expects some in the near future.Also c/o increased shortness of breath wuth whezing, need PFT, same will be ordered Single episode of bright red rectal bl;opod , less than 1 tsp unprovoked by straining, has appt with Gi later today  Current Problems (verified)   Medications Prior to Visit Allergies (verified)   PAST HISTORY  Family History: mother was alcoholic and is deceased, sister incarcerated , with h/o drug use  Social History Single, recently broke up from a long relationship,over 5 years,  boyfriend was cheating, less than 6 months ago, already in a new relationship, condom counseling provided. Never employed, ongoing /recurrent nicotine use, no illicit drug use   Risk Factors  Current exercise habits:  None regularly, advice given to commit to daily exercise g fro 30 mins minimum for physical and mental health  Dietary issues discussed:low fat diet rich in fruit and vegetable   Cardiac risk factors:  nicotine use  Depression Screen  (Note: if answer to either of the following is "Yes", a more complete depression screening is indicated)  Being treated for depression by psychiatry Over the past two weeks, have you felt down, depressed or hopeless? No  Over the past two weeks, have you felt little interest or pleasure in doing things? No  Have you lost interest or pleasure in daily life? No  Do you often feel hopeless? No  Do you cry easily over simple problems? No   Activities of Daily Living  In your present state of health, do you have any  difficulty performing the following activities?  Driving?: yes, due to acute stomach and  Chest and arm pains at times Managing money?: No Feeding yourself?:No Getting from bed to chair?:No Climbing a flight of stairs?:No Preparing food and eating?:No Bathing or showering?:No Getting dressed?:No Getting to the toilet?:No Using the toilet?:No Moving around from place to place?: No  Fall Risk Assessment In the past year have you fallen or had a near fall?:No Are you currently taking any medications that make you dizzy?:yes , med for nausea  And seroquel causes her to "pass out"   Hearing Difficulties: No Do you often ask people to speak up or repeat themselves?:No Do you experience ringing or noises in your ears?:No Do you have difficulty understanding soft or whispered voices?:No  Cognitive Testing  Alert? Yes Normal Appearance?Yes  Oriented to person? Yes Place? Yes  Time? Yes  Displays appropriate judgment?Yes  Can read the correct time from a watch face? yes Are you having problems remembering things?No  Advanced Directives have been discussed with the patient?Yes , full code   List the Names of Other Physician/Practitioners you currently use: Psychiatry, and psychologist. Dr Despina Hidden, Dr Hilda Lias, Dr Darrick Penna, hematology, Dr Anselmo Pickler any recent Medical Services you may have received from other than [facility] providers in the past year (date may be approximate).   Assessment:    Annual Wellness Exam   Plan:    During the course of the visit the patient was educated and counseled about appropriate screening and preventive services including:  A  healthy diet is rich in fruit, vegetables and whole grains. Poultry fish, nuts and beans are a healthy choice for protein rather then red meat. A low sodium diet and drinking 64 ounces of water daily is generally recommended. Oils and sweet should be limited. Carbohydrates especially for those who are diabetic or overweight, should  be limited to 30-45 gram per meal. It is important to eat on a regular schedule, at least 3 times daily. Snacks should be primarily fruits, vegetables or nuts. It is important that you exercise regularly at least 30 minutes 5 times a week. If you develop chest pain, have severe difficulty breathing, or feel very tired, stop exercising immediately and seek medical attention  Immunization reviewed and updated. Cancer screening reviewed and updated    Patient Instructions (the written plan) was given to the patient.  Medicare Attestation  I have personally reviewed:  The patient's medical and social history  Their use of alcohol, tobacco or illicit drugs  Their current medications and supplements  The patient's functional ability including ADLs,fall risks, home safety risks, cognitive, and hearing and visual impairment  Diet and physical activities  Evidence for depression or mood disorders  The patient's weight, height, BMI, and visual acuity have been recorded in the chart. I have made referrals, counseling, and provided education to the patient based on review of the above and I have provided the patient with a written personalized care plan for preventive services.      Review of Systems     Objective:   Physical Exam  Patient alert and oriented and in no cardiopulmonary distress.  HEENT: No facial asymmetry, EOMI, no sinus tenderness,  oropharynx pink and moist.  Neck supple no adenopathy.erythema and edema of nasal mucosa, conjunctival injection, excessive sneezing  Chest: Few wheezes bilaterallytlly.decreased air entry bilaterally  CVS: S1, S2 no murmurs, no S3.  ABD: Soft non tender. Bowel sounds normal.Rectal deferred has GI appt later today  Ext: No edema  MS: Adequate ROM spine, shoulders, hips and knees.  Skin: Intact,  Psych: Good eye contact, flat affect. Memory intact not anxious or depressed appearing.  CNS: CN 2-12 intact, power, tone and sensation normal  throughout.       Assessment & Plan:

## 2012-11-14 NOTE — Assessment & Plan Note (Signed)
Goal is to stop using marijuana, has used since 2008, discussing this with her psychiatrist

## 2012-11-14 NOTE — Telephone Encounter (Signed)
Appointment 11.10.2014 at [facility] for PFT 11.10.2014 at 10:30 spoke with patient she is aware

## 2012-11-14 NOTE — Assessment & Plan Note (Signed)

## 2012-11-14 NOTE — Patient Instructions (Addendum)
F/u in 3 month, call if you need me before  Flu vaccine today.  Please discuss rectal blood with your stomach Doctor, none seen on exam today, and no stool specimen present to check  Work at smoking cessation as we discussed, and contact health Dept in [place], I  believe that classes are offered to help with this. We will also provide you with smoking cessation material  Continue to work closely with mental health in particular.  Medications will be refilled as discussed, and prednisone  dose pack is sent in  cXR today

## 2012-11-14 NOTE — Assessment & Plan Note (Addendum)
Annual wellness as documented Pt has significant psych issues, which are being treated by psychiatry and psychology in [facility]. Has chronic pain syndrome, tends to  take pain med for stomach pain, hip pain etc,was taking up to 6 per day, as well as flexeril up to 3 per day and states medication makes her Loopy. Counseled her to make a significant effort to reduce narcotic use and discuss with prescriber, I also clarified for her the fact that nacotics were not for GI pain but joint  pain

## 2012-11-14 NOTE — Op Note (Signed)
[facility] [address] [place], [address]   ENDOSCOPY PROCEDURE REPORT  PATIENT: Tracey, Daniel  MR#: [id number] BIRTHDATE: 1980-11-07 , 32  yrs. old GENDER: Female ENDOSCOPIST: R.  Roetta Sessions, MD FACP FACG REFERRED BY:  Syliva Overman, M.D. PROCEDURE DATE:  11/14/2012 PROCEDURE:     EGD with gastric biopsy  INDICATIONS:     Epigastric pain; history of pylorospasm requiring Botox injection.  INFORMED CONSENT:   The risks, benefits, limitations, alternatives and imponderables have been discussed.  The potential for biopsy, esophogeal dilation, etc. have also been reviewed.  Questions have been answered.  All parties agreeable.  Please see the history and physical in the medical record for more information.  MEDICATIONS:   Versed 5 mg IV and Demerol 100 mg IV in divided doses. Phenergan 25 mg IV. Zofran 4 mg IV. Cetacaine spray  DESCRIPTION OF PROCEDURE:   The XB-[id number] ([id number])  endoscope was introduced through the mouth and advanced to the second portion of the duodenum without difficulty or limitations.  The mucosal surfaces were surveyed very carefully during advancement of the scope and upon withdrawal.  Retroflexion view of the proximal stomach and esophagogastric junction was performed.      FINDINGS:  Normal-appearing esophagus. Stomach empty. Small hiatal hernia. Stomach thought to be slightly dilated. gastric mucosa slightly mottled with a "scarlatiniform" appearance. No ulcer or infiltrating process.Pyloric channel patent but at times spasm noted and I would have to note a little more than average difficulty intubating the pyloric channel with the gastroscope. Examination of the bulb and second portion however, revealed no abnormalities  THERAPEUTIC / DIAGNOSTIC MANEUVERS PERFORMED:  Biopsies the abnormal. Gastric mucosa taken for histologic study   COMPLICATIONS:  None  IMPRESSION:  Small hiatal hernia. Abnormal gastric  mucosa of uncertain significance-status post biopsy. Subjectively, patient may have recurrent symptomatic, pylorospasm.  RECOMMENDATIONS:   Followup on pathology.    _______________________________ R. Roetta Sessions, MD FACP [facility] eSigned:  R. Roetta Sessions, MD FACP [facility] 11/14/2012 3:59 PM     CC:

## 2012-11-14 NOTE — Interval H&P Note (Signed)
History and Physical Interval Note:  11/14/2012 3:18 PM  Tracey Daniel  has presented today for surgery, with the diagnosis of EPIGASTRIC PAIN, NAUSEA, WEIGHT LOSS AND H/O GASTRIC OUTLET OBSTRUCTION  The various methods of treatment have been discussed with the patient and family. After consideration of risks, benefits and other options for treatment, the patient has consented to  Procedure(s) with comments: ESOPHAGOGASTRODUODENOSCOPY (EGD) (N/A) - 3:30 as a surgical intervention .  The patient's history has been reviewed, patient examined, no change in status, stable for surgery.  I have reviewed the patient's chart and labs.  Questions were answered to the patient's satisfaction.     No change. EGD per plan.  The risks, benefits, limitations, alternatives and imponderables have been reviewed with the patient. Potential for esophageal dilation, biopsy, etc. have also been reviewed.  Questions have been answered. All parties agreeable.  Eula Listen

## 2012-11-17 ENCOUNTER — Encounter: Payer: Self-pay | Admitting: Internal Medicine

## 2012-11-18 ENCOUNTER — Encounter (HOSPITAL_COMMUNITY): Payer: Self-pay | Admitting: Internal Medicine

## 2012-11-20 DIAGNOSIS — E348 Other specified endocrine disorders: Secondary | ICD-10-CM | POA: Insufficient documentation

## 2012-11-20 NOTE — Assessment & Plan Note (Signed)
Followed by endo, neeeds f/u appt hjas not been sice 12/2011, supposed to be followed every 6 month per las note

## 2012-11-20 NOTE — Assessment & Plan Note (Signed)
Deteriorating esp with ongoing nicotine use , cessation counselling donre. Updated PFT needed, same ordered

## 2012-11-20 NOTE — Assessment & Plan Note (Signed)
Uncontrolled, prednisone dose pack and pt to commit to daily claritin

## 2012-11-22 ENCOUNTER — Telehealth: Payer: Self-pay

## 2012-11-22 NOTE — Telephone Encounter (Signed)
Letter from: Corbin Ade  Reason for Letter: Results Review Send letter to patient.  Send copy of letter with path to referring provider and PCP.   Raynelle Fanning: Let's try her on a three-week course of Dexilant 60 mg daily while holding Protonix. Let's see if this makes a difference in her symptoms. Please arrange a followup appointment with extender in about 3-4 weeks from now.

## 2012-11-24 DIAGNOSIS — F333 Major depressive disorder, recurrent, severe with psychotic symptoms: Secondary | ICD-10-CM | POA: Diagnosis not present

## 2012-11-24 NOTE — Telephone Encounter (Signed)
Pt aware, dexilant samples at the front desk.  Darl Pikes, please schedule appt.

## 2012-11-28 ENCOUNTER — Encounter: Payer: Self-pay | Admitting: Internal Medicine

## 2012-11-28 ENCOUNTER — Ambulatory Visit (HOSPITAL_COMMUNITY)
Admission: RE | Admit: 2012-11-28 | Discharge: 2012-11-28 | Disposition: A | Discharge status: Home / Self Care | Payer: Medicare Other | Source: Ambulatory Visit | Attending: Family Medicine | Admitting: Family Medicine

## 2012-11-28 ENCOUNTER — Encounter (HOSPITAL_COMMUNITY): Payer: Medicare Other | Attending: Hematology and Oncology

## 2012-11-28 DIAGNOSIS — D638 Anemia in other chronic diseases classified elsewhere: Secondary | ICD-10-CM | POA: Insufficient documentation

## 2012-11-28 DIAGNOSIS — J4489 Other specified chronic obstructive pulmonary disease: Secondary | ICD-10-CM | POA: Diagnosis not present

## 2012-11-28 DIAGNOSIS — J449 Chronic obstructive pulmonary disease, unspecified: Secondary | ICD-10-CM | POA: Insufficient documentation

## 2012-11-28 DIAGNOSIS — J45909 Unspecified asthma, uncomplicated: Secondary | ICD-10-CM

## 2012-11-28 DIAGNOSIS — D649 Anemia, unspecified: Secondary | ICD-10-CM

## 2012-11-28 LAB — IRON AND TIBC
Iron: 91 ug/dL (ref 42–135)
Saturation Ratios: 31 % (ref 20–55)
TIBC: 293 ug/dL (ref 250–470)
UIBC: 202 ug/dL (ref 125–400)

## 2012-11-28 LAB — CBC WITH DIFFERENTIAL/PLATELET
Eosinophils Relative: 1 % (ref 0–5)
Hemoglobin: 12.2 g/dL (ref 12.0–15.0)
Lymphs Abs: 2.4 10*3/uL (ref 0.7–4.0)
Monocytes Relative: 9 % (ref 3–12)
Neutro Abs: 4.3 10*3/uL (ref 1.7–7.7)
Neutrophils Relative %: 58 % (ref 43–77)
RBC: 4.04 MIL/uL (ref 3.87–5.11)
RDW: 13.5 % (ref 11.5–15.5)

## 2012-11-28 MED ORDER — ALBUTEROL SULFATE (5 MG/ML) 0.5% IN NEBU
2.5000 mg | INHALATION_SOLUTION | Freq: Once | RESPIRATORY_TRACT | Status: AC
  Administered 2012-11-28: 2.5 mg via RESPIRATORY_TRACT

## 2012-11-28 NOTE — Telephone Encounter (Signed)
Pt has OV on 12/2 at 8 with LSL and appt card was mailed

## 2012-11-28 NOTE — Progress Notes (Signed)
Labs drawn today for cbc/diff,ferr,Iron and IBC 

## 2012-11-29 ENCOUNTER — Encounter (HOSPITAL_COMMUNITY): Payer: Self-pay | Admitting: Oncology

## 2012-11-29 DIAGNOSIS — D638 Anemia in other chronic diseases classified elsewhere: Secondary | ICD-10-CM

## 2012-11-29 HISTORY — DX: Anemia in other chronic diseases classified elsewhere: D63.8

## 2012-11-29 NOTE — Progress Notes (Signed)
Syliva Overman, MD [address] [place] [address]  Anemia of other chronic disease - Plan: CBC with Differential, Erythropoietin, Iron and TIBC, Ferritin  CURRENT THERAPY: Observation  INTERVAL HISTORY: Tracey Daniel 32 y.o. female returns for  regular  visit for followup of Anemia most likely secondary to multiple medical issues.  I personally reviewed and went over laboratory results with the patient.  Lab from 11/28/2012 shows a CBC WNL with a WBC of 7.5, Hgb 12.2 g/dL, and platelet count of 167,000.  Iron studies are WNL as well with a ferritin of 79, TIBC of 293, and saturation of 31%.  Past Medical History  Diagnosis Date  . Migraines   . Osteoporosis   . Seasonal allergies   . Sinusitis   . Back pain, chronic   . Nicotine addiction   . Constipation   . Vitamin D deficiency   . Gastritis   . Gastroparesis   . Pyloric spasm 03/30/2011  . Gastric outlet obstruction   . COPD (chronic obstructive pulmonary disease)   . Asthma   . Substance abuse 2008    marijuana  . Peptic ulcer disease   . Depression 2000    h/o suicidal ideation  . Anemia of other chronic disease 11/29/2012    has DEPRESSION; CONSTIPATION; AMENORRHEA, SECONDARY; ECZEMA; BACK PAIN, CHRONIC; RASH AND OTHER NONSPECIFIC SKIN ERUPTION; HEADACHE; CLOSED FRACTURE OF METATARSAL BONE; GERD (gastroesophageal reflux disease); Chronic obstructive asthma, unspecified; H. 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     is allergic to latex and  penicillins.  Ms. Hanline had no medications administered during this visit.  Past Surgical History  Procedure Laterality Date  . Cholecystectomy      ?2002  . Laser surgery on cervix    . Carpal tunnel release      left hand  . Esophagogastroduodenoscopy  12/25/2010    [name], MD;  moderate gastritis, ?goo secondary to pylorspasm. BX showed reactive gstropathy no h.pyori, SB mucosa with intramucosal lymphocytosis and partial villous blunting (TTG 4.0 normal)  . Esophageal dilation  12/25/2010    Procedure: ESOPHAGEAL DILATION;  Surgeon: [name], MD;  Location: [facility];  Service: Endoscopy;;  . Esophagogastroduodenoscopy N/A 11/14/2012    Procedure: ESOPHAGOGASTRODUODENOSCOPY (EGD);  Surgeon: Corbin Ade, MD;  Location: [facility];  Service: Endoscopy;  Laterality: N/A;  3:30    Denies any headaches, dizziness, double vision, fevers, chills, night sweats, nausea, vomiting, diarrhea, constipation, chest pain, heart palpitations, shortness of breath, blood in stool, black tarry stool, urinary pain, urinary burning, urinary frequency, hematuria.   PHYSICAL EXAMINATION  ECOG PERFORMANCE STATUS: 1 - Symptomatic but completely ambulatory  Filed Vitals:   11/30/12 0940  BP: 110/70  Pulse: 72  Temp: 97.8 F (36.6 C)  Resp: 16    GENERAL:alert, cooperative, moderate distress and smiling SKIN: skin color, texture, turgor are normal, no rashes or significant lesions HEAD: Normocephalic, No masses, lesions, tenderness or abnormalities EYES: normal, PERRLA, EOMI, Conjunctiva are pink and non-injected EARS: External ears normal OROPHARYNX:mucous membranes are moist  NECK: supple, trachea midline LYMPH:  no palpable lymphadenopathy BREAST:not examined LUNGS: clear  to auscultation and percussion, decreased breath sounds HEART: regular rate & rhythm, no murmurs, no gallops, S1 normal and S2 normal ABDOMEN:abdomen soft, non-tender, normal bowel sounds, no masses  or organomegaly, epigastric pain on light and deep palpation and no hepatosplenomegaly BACK: Back symmetric, no curvature., No CVA tenderness EXTREMITIES:less then 2 second capillary refill, no joint deformities, effusion, or inflammation, no edema, no skin discoloration.  NEURO: alert & oriented x 3 with fluent speech, no focal motor/sensory deficits, gait normal    LABORATORY DATA: CBC    Component Value Date/Time   WBC 7.5 11/28/2012 0923   RBC 4.04 11/28/2012 0923   HGB 12.2 11/28/2012 0923   HCT 36.6 11/28/2012 0923   PLT 167 11/28/2012 0923   MCV 90.6 11/28/2012 0923   MCH 30.2 11/28/2012 0923   MCHC 33.3 11/28/2012 0923   RDW 13.5 11/28/2012 0923   LYMPHSABS 2.4 11/28/2012 0923   MONOABS 0.6 11/28/2012 0923   EOSABS 0.1 11/28/2012 0923   BASOSABS 0.0 11/28/2012 0923    Lab Results  Component Value Date   IRON 91 11/28/2012   TIBC 293 11/28/2012   FERRITIN 79 11/28/2012     Chemistry      Component Value Date/Time   NA 140 08/11/2012 1256   K 3.8 08/11/2012 1256   CL 103 08/11/2012 1256   CO2 27 08/11/2012 1256   BUN 16 08/11/2012 1256   CREATININE 0.72 08/11/2012 1256      Component Value Date/Time   CALCIUM 9.9 08/11/2012 1256   ALKPHOS 87 08/11/2012 1256   AST 18 08/11/2012 1256   ALT 12 08/11/2012 1256   BILITOT 0.3 08/11/2012 1256        ASSESSMENT:  1. Anemia most likely secondary to multiple medical issues.  On ferrous sulfate 325 mg daily.  2. Testosterone excess with premature menopause  3. Childhood asthma now not smoking x2 years  4. Osteopenia, on Ca 1200 mg and Vit D 2000 units daily.  5. History of migraines  6. History depression  7. Gastroparesis  8. Vitamin D deficiency  9. Botox injection for pyloric spasm 10. Tobacco abuse, > 45 pack year tobacco history 11. Epigastric pain, discussed with Tana Coast and we agreed upon doubling Protonix to 80 mg daily and adding Carafate until seen by GI in the near future.    Patient Active Problem  List   Diagnosis Date Noted  . Anemia of other chronic disease 11/29/2012  . 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 Nicotine dependence 11/14/2012  . Marijuana use 11/14/2012  . Seasonal allergies 11/14/2012  . Routine general medical examination at a health care facility 11/14/2012  . Nausea alone 11/09/2012  . Abnormal weight loss 11/09/2012  . Nausea with vomiting 09/03/2012  . Anorexia 07/27/2012  . Constipation 12/02/2011  . Hypercapnia 05/22/2011  . Gastroparesis 05/01/2011  . Cyst 04/27/2011  . Pyloric spasm 03/30/2011  . Colitis - presumed infectious origin 03/25/2011  . Hip pain, right 03/02/2011  . Anemia 01/30/2011  . Gastric outlet obstruction 01/01/2011  . Epigastric pain 12/24/2010  . H. pylori infection 11/03/2010  . Chronic obstructive asthma, unspecified 09/09/2010  . GERD (gastroesophageal reflux disease) 08/21/2010  . CONSTIPATION 06/04/2009  . RASH AND OTHER NONSPECIFIC SKIN ERUPTION 03/13/2008  . HEADACHE 03/13/2008  . AMENORRHEA, SECONDARY 09/27/2007  . ECZEMA 09/27/2007  . DEPRESSION 07/01/2007  . BACK PAIN, CHRONIC 07/01/2007  . CLOSED FRACTURE OF METATARSAL BONE 01/27/2007      PLAN:  1. I personally  reviewed and went over laboratory results with the patient. 2. Labs in 1 year: CBC diff, Iron/TIBC, Ferritin, and EPO level. 3. Influenza vaccine given at outside facility already. 4. Continue follow-up with PCP and GI as directed.  5. Discussed cased with leslie Lewis (GI) and we will do the following for epigastric pain  A. Increase Protonix to 80 mg daily  B. Rx for Carafate 10 mL QID with 1 refill 6. Continue Ferrous Sulfate daily.  7. Follow-up with PCP as directed 8. Return in 1 year for follow-up    THERAPY PLAN:  From a hematology standpoint, this lady's lab work most recently was WNL.  As a result, we will see her back next year.  She knows to contact the clinic sooner if necessary.  All questions were  answered. The patient knows to call the clinic with any problems, questions or concerns. We can certainly see the patient much sooner if necessary.  Patient and plan discussed with Dr. Annamarie Dawley and she is in agreement with the aforementioned.   KEFALAS,THOMAS

## 2012-11-29 NOTE — Procedures (Signed)
Tracey Daniel, SALIBA            ACCOUNT NO.:  [id number]  MEDICAL RECORD NO.:  [id number]  LOCATION:  RESP                          FACILITY:  [facility]  PHYSICIAN:  Patrich Heinze L. Juanetta Gosling, M.D.DATE OF BIRTH:  January 05, 1981  DATE OF PROCEDURE: DATE OF DISCHARGE:  11/28/2012                           PULMONARY FUNCTION TEST   Reason for pulmonary function testing is COPD.  1. Spirometry shows no definite ventilatory defect with evidence of     airflow obstruction. 2. Lung volumes show air trapping. 3. DLCO is severely reduced. 4. Airway resistance is normal. 5. There is no significant bronchodilator improvement. 6. Please note the technician's comments about her having a difficult     time during the test.  I think this study is consistent with     asthma/COPD.     Amberia Bayless L. Juanetta Gosling, M.D.     ELH/MEDQ  D:  11/28/2012  T:  11/29/2012  Job:  [id number]  cc:   Milus Mallick. Lodema Hong, M.D. Fax: [phone]

## 2012-11-30 ENCOUNTER — Encounter (HOSPITAL_BASED_OUTPATIENT_CLINIC_OR_DEPARTMENT_OTHER): Payer: Medicare Other | Admitting: Oncology

## 2012-11-30 ENCOUNTER — Encounter (HOSPITAL_COMMUNITY): Payer: Self-pay | Admitting: Oncology

## 2012-11-30 ENCOUNTER — Telehealth: Payer: Self-pay | Admitting: Gastroenterology

## 2012-11-30 VITALS — BP 110/70 | HR 72 | Temp 97.8°F | Resp 16 | Wt 134.9 lb

## 2012-11-30 DIAGNOSIS — K219 Gastro-esophageal reflux disease without esophagitis: Secondary | ICD-10-CM

## 2012-11-30 DIAGNOSIS — M899 Disorder of bone, unspecified: Secondary | ICD-10-CM | POA: Diagnosis not present

## 2012-11-30 DIAGNOSIS — D649 Anemia, unspecified: Secondary | ICD-10-CM

## 2012-11-30 DIAGNOSIS — R1013 Epigastric pain: Secondary | ICD-10-CM | POA: Diagnosis not present

## 2012-11-30 DIAGNOSIS — D638 Anemia in other chronic diseases classified elsewhere: Secondary | ICD-10-CM

## 2012-11-30 MED ORDER — PANTOPRAZOLE SODIUM 40 MG PO TBEC: 40.0000 mg | DELAYED_RELEASE_TABLET | Freq: Every day | ORAL | Status: DC

## 2012-11-30 MED ORDER — SUCRALFATE 1 GM/10ML PO SUSP: 1.0000 g | Freq: Three times a day (TID) | ORAL | Status: DC

## 2012-11-30 NOTE — Telephone Encounter (Signed)
Received phone call from Jenita Seashore, [place]. Patient with ongoing epigastric pain, quite tender on exam today in his office. Unclear whether patient is taking the Dexilant samples as planned. If she is not, can increase protonic to twice a day. Carafate to be added.  Let's see if we can get the patient in sooner than her December 2 appointment. Thank you.

## 2012-11-30 NOTE — Patient Instructions (Signed)
[facility] Discharge Instructions  RECOMMENDATIONS MADE BY THE CONSULTANT AND ANY TEST RESULTS WILL BE SENT TO YOUR REFERRING PHYSICIAN.  EXAM FINDINGS BY THE PHYSICIAN TODAY AND SIGNS OR SYMPTOMS TO REPORT TO CLINIC OR PRIMARY PHYSICIAN: Exam and findings as discussed by T. Jacalyn Lefevre, PA-C.  MEDICATIONS PRESCRIBED:  1.  Protonix doubled 2.  Carafate called in to drug store, take as prescribed  INSTRUCTIONS/FOLLOW-UP: 1.  Follow-up in 1 year for labs and an office visit.  Please contact us sooner if questions or concerns.  Thank you for choosing [facility] to provide your oncology and hematology care.  To afford each patient quality time with our providers, please arrive at least 15 minutes before your scheduled appointment time.  With your help, our goal is to use those 15 minutes to complete the necessary work-up to ensure our physicians have the information they need to help with your evaluation and healthcare recommendations.    Effective January 1st, 2014, we ask that you re-schedule your appointment with our physicians should you arrive 10 or more minutes late for your appointment.  We strive to give you quality time with our providers, and arriving late affects you and other patients whose appointments are after yours.    Again, thank you for choosing [facility].  Our hope is that these requests will decrease the amount of time that you wait before being seen by our physicians.       _____________________________________________________________  Should you have questions after your visit to [facility], please contact our office at [phone] between the hours of 8:30 a.m. and 5:00 p.m.  Voicemails left after 4:30 p.m. will not be returned until the following business day.  For prescription refill requests, have your pharmacy contact our office with your prescription refill request.

## 2012-12-01 ENCOUNTER — Encounter: Payer: Self-pay | Admitting: Family Medicine

## 2012-12-01 DIAGNOSIS — J45991 Cough variant asthma: Secondary | ICD-10-CM | POA: Insufficient documentation

## 2012-12-01 NOTE — Telephone Encounter (Signed)
Pt is aware to disregard the OV for 12/2 and she can be seen on 11/20 at 11 with AS. Patient agreed

## 2012-12-05 DIAGNOSIS — E559 Vitamin D deficiency, unspecified: Secondary | ICD-10-CM | POA: Diagnosis not present

## 2012-12-05 DIAGNOSIS — E785 Hyperlipidemia, unspecified: Secondary | ICD-10-CM | POA: Diagnosis not present

## 2012-12-05 DIAGNOSIS — N912 Amenorrhea, unspecified: Secondary | ICD-10-CM | POA: Diagnosis not present

## 2012-12-05 DIAGNOSIS — M810 Age-related osteoporosis without current pathological fracture: Secondary | ICD-10-CM | POA: Diagnosis not present

## 2012-12-07 DIAGNOSIS — M25579 Pain in unspecified ankle and joints of unspecified foot: Secondary | ICD-10-CM | POA: Diagnosis not present

## 2012-12-07 DIAGNOSIS — M545 Low back pain: Secondary | ICD-10-CM | POA: Diagnosis not present

## 2012-12-07 DIAGNOSIS — M818 Other osteoporosis without current pathological fracture: Secondary | ICD-10-CM | POA: Diagnosis not present

## 2012-12-07 DIAGNOSIS — M25559 Pain in unspecified hip: Secondary | ICD-10-CM | POA: Diagnosis not present

## 2012-12-07 DIAGNOSIS — F17200 Nicotine dependence, unspecified, uncomplicated: Secondary | ICD-10-CM | POA: Diagnosis not present

## 2012-12-07 DIAGNOSIS — N912 Amenorrhea, unspecified: Secondary | ICD-10-CM | POA: Diagnosis not present

## 2012-12-07 DIAGNOSIS — E785 Hyperlipidemia, unspecified: Secondary | ICD-10-CM | POA: Diagnosis not present

## 2012-12-07 DIAGNOSIS — E559 Vitamin D deficiency, unspecified: Secondary | ICD-10-CM | POA: Diagnosis not present

## 2012-12-08 ENCOUNTER — Ambulatory Visit: Payer: Medicare Other | Admitting: Gastroenterology

## 2012-12-12 DIAGNOSIS — E785 Hyperlipidemia, unspecified: Secondary | ICD-10-CM | POA: Diagnosis not present

## 2012-12-12 DIAGNOSIS — E559 Vitamin D deficiency, unspecified: Secondary | ICD-10-CM | POA: Diagnosis not present

## 2012-12-12 DIAGNOSIS — N912 Amenorrhea, unspecified: Secondary | ICD-10-CM | POA: Diagnosis not present

## 2012-12-12 DIAGNOSIS — D0910 Carcinoma in situ of unspecified urinary organ: Secondary | ICD-10-CM | POA: Diagnosis not present

## 2012-12-14 LAB — PULMONARY FUNCTION TEST

## 2012-12-19 ENCOUNTER — Ambulatory Visit (INDEPENDENT_AMBULATORY_CARE_PROVIDER_SITE_OTHER): Payer: Medicare Other | Admitting: Gastroenterology

## 2012-12-19 ENCOUNTER — Encounter: Payer: Self-pay | Admitting: Gastroenterology

## 2012-12-19 VITALS — BP 125/78 | HR 68 | Temp 98.3°F | Wt 141.0 lb

## 2012-12-19 DIAGNOSIS — R1013 Epigastric pain: Secondary | ICD-10-CM

## 2012-12-19 DIAGNOSIS — K313 Pylorospasm, not elsewhere classified: Secondary | ICD-10-CM

## 2012-12-19 DIAGNOSIS — K5900 Constipation, unspecified: Secondary | ICD-10-CM

## 2012-12-19 DIAGNOSIS — K311 Adult hypertrophic pyloric stenosis: Secondary | ICD-10-CM | POA: Diagnosis not present

## 2012-12-19 DIAGNOSIS — K219 Gastro-esophageal reflux disease without esophagitis: Secondary | ICD-10-CM

## 2012-12-19 MED ORDER — LINACLOTIDE 145 MCG PO CAPS: 145.0000 ug | ORAL_CAPSULE | Freq: Every day | ORAL | Status: DC

## 2012-12-19 MED ORDER — DEXLANSOPRAZOLE 60 MG PO CPDR: 60.0000 mg | DELAYED_RELEASE_CAPSULE | Freq: Every day | ORAL | Status: DC

## 2012-12-19 NOTE — Progress Notes (Signed)
Referral has been faxed to Dr. Alycia Rossetti at [facility]

## 2012-12-19 NOTE — Patient Instructions (Signed)
1. Stop pantoprazole. 2. Start Dexilant one daily before breakfast. Samples provided. I also sent a prescription to your pharmacy to see if it's going to be covered. 3. Start Linzess for constipation. Take 30 minutes before breakfast. Prescription sent to your pharmacy to see if it's going to be covered. 4. Referral to [facility] for Botox injections. If you do not hear from them within 2 weeks with an appointment date, please let us know. 5. If you have severe pain, intractable vomiting, fever you should call us or go to ER.

## 2012-12-19 NOTE — Assessment & Plan Note (Addendum)
Recurrent epigastric pain, refractory heartburn likely secondary to pylorospasm. Recent EGD findings as noted. Recommend followup at [facility] for consideration of Botox injections. In the interim, we will switch her pantoprazole to Dexilant 60 mg daily. Samples provided. Prescription sent to see if covered. Consume a low-fat/low fiber diet for now. If she were to develop postprandial nausea, bloating, vomiting she should back down to clear liquids only. She knows to call if her abdominal pain worsens or she develops refractory vomiting and fever. Would have a low threshold for repeat CT of the abdomen if no improvement noted. Patient agrees with plans as outlined.

## 2012-12-19 NOTE — Assessment & Plan Note (Signed)
Stop MiraLax. Begin Linzess daily before breakfast. RX and voucher provided.

## 2012-12-19 NOTE — Progress Notes (Signed)
Primary Care Physician: Syliva Overman, MD  Primary Gastroenterologist:  Roetta Sessions, MD   Chief Complaint  Patient presents with  . Follow-up    HPI: Tracey Daniel is a 32 y.o. female here for followup of recent EGD. She was seen back in October for an urgent visit for abdominal pain. She has a history of gastro-soft her reflux disease, gastroparesis, pylorospasm resulting in gastric outlet obstruction. She was evaluated in 2012 at [facility] and underwent Botox injections. She responded nicely. Recent EGD showed abnormal gastric mucosa, benign biopsy. Dr. Jena Gauss felt that subjectively she likely had recurrent symptomatic, or around spasm. Stomach felt a slightly dilated on EGD. Pyloric channel patent but at times spasm noted and he had more difficulty intubating the pyloric channel with the gastroscope.   Recently her pantoprazole was increased to twice daily and she was added on Carafate by Dellis Anes, PA-C after discussion with me regarding epigastric tenderness on exam. She had ran out of Dexilant samples at that time. States Dexilant worked much better for her terrible heartburn and epigastric burning. Carafate helps some with the pain. Still with pain but not quite as bad as before. Eating some. Weight up since last visit.  Still has to eat very small portions at a time. Does not tolerate high fiber foods. No vomiting but has pp nausea/bloating. Very small BM every 3-4 days. Miralax only as needed.    Current Outpatient Prescriptions  Medication Sig Dispense Refill  . albuterol (PROVENTIL HFA;VENTOLIN HFA) 108 (90 BASE) MCG/ACT inhaler Inhale 2 puffs into the lungs every 6 (six) hours as needed. Shortness of breath  6.7 g  4  . budesonide-formoterol (SYMBICORT) 80-4.5 MCG/ACT inhaler Inhale 2 puffs into the lungs 2 (two) times daily.  1 Inhaler  4  . calcium carbonate 1250 MG capsule Take 1,250 mg by mouth 2 (two) times daily with a meal.      . calcium-vitamin D (OSCAL 500/200  D-3) 500-200 MG-UNIT per tablet Take 1 tablet by mouth 2 (two) times daily.  180 tablet  3  . Cholecalciferol (VITAMIN D3) 2000 UNITS CHEW Chew by mouth.      . cyclobenzaprine (FLEXERIL) 10 MG tablet Take 10 mg by mouth 3 (three) times daily as needed. For muscle spasms      . DULoxetine (CYMBALTA) 30 MG capsule Take 1 capsule (30 mg total) by mouth daily.  30 capsule  4  . ergocalciferol (VITAMIN D2) 50000 UNITS capsule Take 1 capsule (50,000 Units total) by mouth once a week. One capsule once weekly  12 capsule  1  . estradiol (ESTRACE VAGINAL) 0.1 MG/GM vaginal cream Place 2 g vaginally daily. As directed      . feeding supplement (ENSURE CLINICAL STRENGTH) LIQD Take 237 mLs by mouth 3 (three) times daily with meals.  10 Bottle  3  . ferrous sulfate 325 (65 FE) MG tablet Take 325 mg by mouth daily with breakfast.      . flintstones complete (FLINTSTONES) 60 MG chewable tablet Chew 1 tablet by mouth daily.      . fluticasone (CUTIVATE) 0.05 % cream Apply 1 application topically 2 (two) times daily.      Marland Kitchen HYDROcodone-acetaminophen (NORCO) 7.5-325 MG per tablet Take 1 tablet by mouth every 4 (four) hours as needed. For pain      . loratadine (CLARITIN) 10 MG tablet Take 1 tablet (10 mg total) by mouth daily.  30 tablet  5  . ondansetron (ZOFRAN) 4 MG tablet 4 mg.      Marland Kitchen  pantoprazole (PROTONIX) 40 MG tablet Take 1 tablet (40 mg total) by mouth daily.  60 tablet  1  . promethazine (PHENERGAN) 25 MG tablet Take 1 tablet (25 mg total) by mouth every 6 (six) hours as needed for nausea.  30 tablet  0  . SEROQUEL XR 300 MG 24 hr tablet       . sucralfate (CARAFATE) 1 GM/10ML suspension Take 10 mLs (1 g total) by mouth 4 (four) times daily -  with meals and at bedtime.  420 mL  1  . fluticasone (FLONASE) 50 MCG/ACT nasal spray Place 2 sprays into the nose daily.  16 g  2  . predniSONE (STERAPRED UNI-PAK) 5 MG TABS tablet Take 1 tablet (5 mg total) by mouth as directed. Use as directed  21 tablet  0    No current facility-administered medications for this visit.    Allergies as of 12/19/2012 - Review Complete 12/19/2012  Allergen Reaction Noted  . Latex Itching and Other (See Comments) 11/30/2011  . Penicillins      ROS:  General: Negative for anorexia, weight loss, fever, chills, fatigue, weakness. ENT: Negative for hoarseness, difficulty swallowing , nasal congestion. CV: Negative for chest pain, angina, palpitations, dyspnea on exertion, peripheral edema.  Respiratory: Negative for dyspnea at rest, dyspnea on exertion, cough, sputum, wheezing.  GI: See history of present illness. GU:  Negative for dysuria, hematuria, urinary incontinence, urinary frequency, nocturnal urination.  Endo: Negative for unusual weight change.    Physical Examination:   BP 125/78  Pulse 68  Temp(Src) 98.3 F (36.8 C) (Oral)  Wt 141 lb (63.957 kg)  LMP 01/19/1997  General: Well-nourished, well-developed in no acute distress.  Eyes: No icterus. Mouth: Oropharyngeal mucosa moist and pink , no lesions erythema or exudate. Lungs: Clear to auscultation bilaterally.  Heart: Regular rate and rhythm, no murmurs rubs or gallops.  Abdomen: Bowel sounds are normal, moderate epigastric tenderness, nondistended, no hepatosplenomegaly or masses, no abdominal bruits or hernia , no rebound or guarding.   Extremities: No lower extremity edema. No clubbing or deformities. Neuro: Alert and oriented x 4   Skin: Warm and dry, no jaundice.   Psych: Alert and cooperative, normal mood and affect.  Labs:  Lab Results  Component Value Date   WBC 7.5 11/28/2012   HGB 12.2 11/28/2012   HCT 36.6 11/28/2012   MCV 90.6 11/28/2012   PLT 167 11/28/2012   Lab Results  Component Value Date   FERRITIN 79 11/28/2012    Imaging Studies: No results found.

## 2012-12-20 ENCOUNTER — Ambulatory Visit: Payer: Medicare Other | Admitting: Gastroenterology

## 2012-12-20 NOTE — Progress Notes (Signed)
cc'd to pcp 

## 2012-12-23 ENCOUNTER — Other Ambulatory Visit: Payer: Self-pay | Admitting: Family Medicine

## 2012-12-23 DIAGNOSIS — R942 Abnormal results of pulmonary function studies: Secondary | ICD-10-CM

## 2012-12-26 NOTE — Progress Notes (Signed)
Faxed over to Dr. Juanetta Gosling office they will call patient with an appointment

## 2013-01-04 DIAGNOSIS — M25579 Pain in unspecified ankle and joints of unspecified foot: Secondary | ICD-10-CM | POA: Diagnosis not present

## 2013-01-04 DIAGNOSIS — M25559 Pain in unspecified hip: Secondary | ICD-10-CM | POA: Diagnosis not present

## 2013-01-04 DIAGNOSIS — F17200 Nicotine dependence, unspecified, uncomplicated: Secondary | ICD-10-CM | POA: Diagnosis not present

## 2013-01-25 NOTE — Progress Notes (Signed)
Appointment with Dr. Derrill Kay on Wednesday Jan 14th 2015

## 2013-02-03 DIAGNOSIS — M25579 Pain in unspecified ankle and joints of unspecified foot: Secondary | ICD-10-CM | POA: Diagnosis not present

## 2013-02-03 DIAGNOSIS — F17200 Nicotine dependence, unspecified, uncomplicated: Secondary | ICD-10-CM | POA: Diagnosis not present

## 2013-02-03 DIAGNOSIS — M545 Low back pain, unspecified: Secondary | ICD-10-CM | POA: Diagnosis not present

## 2013-02-03 DIAGNOSIS — M25559 Pain in unspecified hip: Secondary | ICD-10-CM | POA: Diagnosis not present

## 2013-02-08 ENCOUNTER — Emergency Department (HOSPITAL_COMMUNITY): Payer: Medicare Other

## 2013-02-08 ENCOUNTER — Inpatient Hospital Stay (HOSPITAL_COMMUNITY)
Admission: AD | Admit: 2013-02-08 | Discharge: 2013-02-10 | DRG: 382 | Disposition: A | Discharge status: Home / Self Care | Payer: Medicare Other | Attending: Family Medicine | Admitting: Family Medicine

## 2013-02-08 ENCOUNTER — Encounter (HOSPITAL_COMMUNITY): Payer: Self-pay | Admitting: Emergency Medicine

## 2013-02-08 DIAGNOSIS — E348 Other specified endocrine disorders: Secondary | ICD-10-CM

## 2013-02-08 DIAGNOSIS — K311 Adult hypertrophic pyloric stenosis: Secondary | ICD-10-CM | POA: Diagnosis not present

## 2013-02-08 DIAGNOSIS — G8929 Other chronic pain: Secondary | ICD-10-CM | POA: Diagnosis not present

## 2013-02-08 DIAGNOSIS — R630 Anorexia: Secondary | ICD-10-CM

## 2013-02-08 DIAGNOSIS — R634 Abnormal weight loss: Secondary | ICD-10-CM

## 2013-02-08 DIAGNOSIS — R1013 Epigastric pain: Secondary | ICD-10-CM | POA: Diagnosis not present

## 2013-02-08 DIAGNOSIS — K313 Pylorospasm, not elsewhere classified: Secondary | ICD-10-CM | POA: Diagnosis present

## 2013-02-08 DIAGNOSIS — J45991 Cough variant asthma: Secondary | ICD-10-CM

## 2013-02-08 DIAGNOSIS — R109 Unspecified abdominal pain: Secondary | ICD-10-CM

## 2013-02-08 DIAGNOSIS — F17200 Nicotine dependence, unspecified, uncomplicated: Secondary | ICD-10-CM

## 2013-02-08 DIAGNOSIS — M810 Age-related osteoporosis without current pathological fracture: Secondary | ICD-10-CM | POA: Diagnosis present

## 2013-02-08 DIAGNOSIS — K3184 Gastroparesis: Secondary | ICD-10-CM | POA: Diagnosis not present

## 2013-02-08 DIAGNOSIS — A048 Other specified bacterial intestinal infections: Secondary | ICD-10-CM

## 2013-02-08 DIAGNOSIS — F1290 Cannabis use, unspecified, uncomplicated: Secondary | ICD-10-CM

## 2013-02-08 DIAGNOSIS — R51 Headache: Secondary | ICD-10-CM

## 2013-02-08 DIAGNOSIS — K2970 Gastritis, unspecified, without bleeding: Secondary | ICD-10-CM | POA: Diagnosis not present

## 2013-02-08 DIAGNOSIS — N912 Amenorrhea, unspecified: Secondary | ICD-10-CM

## 2013-02-08 DIAGNOSIS — Z833 Family history of diabetes mellitus: Secondary | ICD-10-CM

## 2013-02-08 DIAGNOSIS — K5900 Constipation, unspecified: Secondary | ICD-10-CM

## 2013-02-08 DIAGNOSIS — L259 Unspecified contact dermatitis, unspecified cause: Secondary | ICD-10-CM

## 2013-02-08 DIAGNOSIS — K2960 Other gastritis without bleeding: Secondary | ICD-10-CM | POA: Diagnosis present

## 2013-02-08 DIAGNOSIS — J302 Other seasonal allergic rhinitis: Secondary | ICD-10-CM

## 2013-02-08 DIAGNOSIS — R1115 Cyclical vomiting syndrome unrelated to migraine: Secondary | ICD-10-CM | POA: Diagnosis not present

## 2013-02-08 DIAGNOSIS — R110 Nausea: Secondary | ICD-10-CM

## 2013-02-08 DIAGNOSIS — F329 Major depressive disorder, single episode, unspecified: Secondary | ICD-10-CM

## 2013-02-08 DIAGNOSIS — M25551 Pain in right hip: Secondary | ICD-10-CM

## 2013-02-08 DIAGNOSIS — Z0000 Encounter for general adult medical examination without abnormal findings: Secondary | ICD-10-CM

## 2013-02-08 DIAGNOSIS — D649 Anemia, unspecified: Secondary | ICD-10-CM

## 2013-02-08 DIAGNOSIS — S92309A Fracture of unspecified metatarsal bone(s), unspecified foot, initial encounter for closed fracture: Secondary | ICD-10-CM

## 2013-02-08 DIAGNOSIS — E281 Androgen excess: Secondary | ICD-10-CM

## 2013-02-08 DIAGNOSIS — F1210 Cannabis abuse, uncomplicated: Secondary | ICD-10-CM | POA: Diagnosis present

## 2013-02-08 DIAGNOSIS — E559 Vitamin D deficiency, unspecified: Secondary | ICD-10-CM | POA: Diagnosis present

## 2013-02-08 DIAGNOSIS — R21 Rash and other nonspecific skin eruption: Secondary | ICD-10-CM

## 2013-02-08 DIAGNOSIS — R0689 Other abnormalities of breathing: Secondary | ICD-10-CM

## 2013-02-08 DIAGNOSIS — J449 Chronic obstructive pulmonary disease, unspecified: Secondary | ICD-10-CM | POA: Diagnosis present

## 2013-02-08 DIAGNOSIS — F3289 Other specified depressive episodes: Secondary | ICD-10-CM

## 2013-02-08 DIAGNOSIS — R197 Diarrhea, unspecified: Secondary | ICD-10-CM | POA: Diagnosis not present

## 2013-02-08 DIAGNOSIS — D638 Anemia in other chronic diseases classified elsewhere: Secondary | ICD-10-CM

## 2013-02-08 DIAGNOSIS — K219 Gastro-esophageal reflux disease without esophagitis: Secondary | ICD-10-CM

## 2013-02-08 DIAGNOSIS — Z8711 Personal history of peptic ulcer disease: Secondary | ICD-10-CM

## 2013-02-08 DIAGNOSIS — M549 Dorsalgia, unspecified: Secondary | ICD-10-CM

## 2013-02-08 DIAGNOSIS — R112 Nausea with vomiting, unspecified: Secondary | ICD-10-CM | POA: Diagnosis present

## 2013-02-08 DIAGNOSIS — K2990 Gastroduodenitis, unspecified, without bleeding: Secondary | ICD-10-CM | POA: Diagnosis not present

## 2013-02-08 DIAGNOSIS — Z801 Family history of malignant neoplasm of trachea, bronchus and lung: Secondary | ICD-10-CM

## 2013-02-08 DIAGNOSIS — J4489 Other specified chronic obstructive pulmonary disease: Secondary | ICD-10-CM | POA: Diagnosis present

## 2013-02-08 HISTORY — DX: Unspecified abdominal pain: R10.9

## 2013-02-08 HISTORY — DX: Other chronic pain: G89.29

## 2013-02-08 HISTORY — DX: Nausea with vomiting, unspecified: R11.2

## 2013-02-08 LAB — URINALYSIS, ROUTINE W REFLEX MICROSCOPIC
BILIRUBIN URINE: NEGATIVE
Glucose, UA: NEGATIVE mg/dL
Hgb urine dipstick: NEGATIVE
KETONES UR: NEGATIVE mg/dL
Leukocytes, UA: NEGATIVE
NITRITE: NEGATIVE
Protein, ur: 30 mg/dL — AB
Specific Gravity, Urine: 1.019 (ref 1.005–1.030)
UROBILINOGEN UA: 0.2 mg/dL (ref 0.0–1.0)
pH: 8.5 — ABNORMAL HIGH (ref 5.0–8.0)

## 2013-02-08 LAB — CBC WITH DIFFERENTIAL/PLATELET
BASOS ABS: 0 10*3/uL (ref 0.0–0.1)
Basophils Relative: 0 % (ref 0–1)
EOS ABS: 0 10*3/uL (ref 0.0–0.7)
Eosinophils Relative: 0 % (ref 0–5)
HCT: 37.1 % (ref 36.0–46.0)
Hemoglobin: 12.4 g/dL (ref 12.0–15.0)
Lymphocytes Relative: 13 % (ref 12–46)
Lymphs Abs: 1.8 10*3/uL (ref 0.7–4.0)
MCH: 29.7 pg (ref 26.0–34.0)
MCHC: 33.4 g/dL (ref 30.0–36.0)
MCV: 89 fL (ref 78.0–100.0)
Monocytes Absolute: 0.7 10*3/uL (ref 0.1–1.0)
Monocytes Relative: 5 % (ref 3–12)
Neutro Abs: 11.1 10*3/uL — ABNORMAL HIGH (ref 1.7–7.7)
Neutrophils Relative %: 81 % — ABNORMAL HIGH (ref 43–77)
PLATELETS: 181 10*3/uL (ref 150–400)
RBC: 4.17 MIL/uL (ref 3.87–5.11)
RDW: 13.5 % (ref 11.5–15.5)
WBC: 13.7 10*3/uL — ABNORMAL HIGH (ref 4.0–10.5)

## 2013-02-08 LAB — RAPID URINE DRUG SCREEN, HOSP PERFORMED
Amphetamines: NOT DETECTED
BARBITURATES: NOT DETECTED
Benzodiazepines: NOT DETECTED
Cocaine: NOT DETECTED
Opiates: NOT DETECTED
TETRAHYDROCANNABINOL: POSITIVE — AB

## 2013-02-08 LAB — COMPREHENSIVE METABOLIC PANEL
ALT: 16 U/L (ref 0–35)
AST: 36 U/L (ref 0–37)
Albumin: 4.6 g/dL (ref 3.5–5.2)
Alkaline Phosphatase: 81 U/L (ref 39–117)
BUN: 18 mg/dL (ref 6–23)
CALCIUM: 9.7 mg/dL (ref 8.4–10.5)
CO2: 23 mEq/L (ref 19–32)
Chloride: 98 mEq/L (ref 96–112)
Creatinine, Ser: 0.72 mg/dL (ref 0.50–1.10)
GFR calc non Af Amer: >90 mL/min (ref >90)
GLUCOSE: 131 mg/dL — AB (ref 70–99)
Potassium: 4.8 mEq/L (ref 3.7–5.3)
SODIUM: 139 meq/L (ref 137–147)
Total Bilirubin: 0.3 mg/dL (ref 0.3–1.2)
Total Protein: 8.2 g/dL (ref 6.0–8.3)

## 2013-02-08 LAB — PREGNANCY, URINE: Preg Test, Ur: NEGATIVE

## 2013-02-08 LAB — LIPASE, BLOOD: Lipase: 30 U/L (ref 11–59)

## 2013-02-08 MED ORDER — ACETAMINOPHEN 325 MG PO TABS: 650.0000 mg | ORAL_TABLET | Freq: Four times a day (QID) | ORAL | Status: DC | PRN

## 2013-02-08 MED ORDER — IPRATROPIUM-ALBUTEROL 0.5-2.5 (3) MG/3ML IN SOLN: 3.0000 mL | RESPIRATORY_TRACT | Status: DC | PRN

## 2013-02-08 MED ORDER — MORPHINE SULFATE 4 MG/ML IJ SOLN
4.0000 mg | INTRAMUSCULAR | Status: DC | PRN
  Administered 2013-02-08: 4 mg via INTRAVENOUS
  Filled 2013-02-08: qty 1

## 2013-02-08 MED ORDER — SODIUM CHLORIDE 0.9 % IV SOLN: INTRAVENOUS | Status: DC

## 2013-02-08 MED ORDER — PANTOPRAZOLE SODIUM 40 MG IV SOLR
40.0000 mg | INTRAVENOUS | Status: DC
  Administered 2013-02-08 – 2013-02-09 (×2): 40 mg via INTRAVENOUS
  Filled 2013-02-08 (×3): qty 40

## 2013-02-08 MED ORDER — FAMOTIDINE 20 MG PO TABS
40.0000 mg | ORAL_TABLET | Freq: Once | ORAL | Status: AC
  Administered 2013-02-08: 40 mg via ORAL
  Filled 2013-02-08: qty 2

## 2013-02-08 MED ORDER — SODIUM CHLORIDE 0.9 % IV BOLUS (SEPSIS)
500.0000 mL | Freq: Once | INTRAVENOUS | Status: AC
  Administered 2013-02-08: 500 mL via INTRAVENOUS

## 2013-02-08 MED ORDER — PANTOPRAZOLE SODIUM 40 MG IV SOLR
40.0000 mg | Freq: Once | INTRAVENOUS | Status: AC
  Administered 2013-02-08: 40 mg via INTRAVENOUS
  Filled 2013-02-08: qty 40

## 2013-02-08 MED ORDER — ONDANSETRON HCL 4 MG/2ML IJ SOLN
4.0000 mg | INTRAMUSCULAR | Status: DC | PRN
  Administered 2013-02-08: 4 mg via INTRAVENOUS

## 2013-02-08 MED ORDER — SODIUM CHLORIDE 0.9 % IV SOLN
INTRAVENOUS | Status: DC
  Administered 2013-02-08: 19:00:00 via INTRAVENOUS

## 2013-02-08 MED ORDER — ONDANSETRON HCL 4 MG/2ML IJ SOLN
4.0000 mg | Freq: Three times a day (TID) | INTRAMUSCULAR | Status: DC | PRN
Start: 2013-02-08 — End: 2013-02-08

## 2013-02-08 MED ORDER — MORPHINE SULFATE 2 MG/ML IJ SOLN
2.0000 mg | INTRAMUSCULAR | Status: DC | PRN
  Administered 2013-02-09 – 2013-02-10 (×4): 2 mg via INTRAVENOUS
  Filled 2013-02-08 (×4): qty 1

## 2013-02-08 MED ORDER — HYDROMORPHONE HCL PF 1 MG/ML IJ SOLN: 0.5000 mg | INTRAMUSCULAR | Status: DC | PRN

## 2013-02-08 MED ORDER — OXYCODONE-ACETAMINOPHEN 5-325 MG PO TABS: 2.0000 | ORAL_TABLET | Freq: Once | ORAL | Status: DC

## 2013-02-08 MED ORDER — SODIUM CHLORIDE 0.9 % IV SOLN
INTRAVENOUS | Status: DC
  Administered 2013-02-08 (×2): via INTRAVENOUS

## 2013-02-08 MED ORDER — FENTANYL CITRATE 0.05 MG/ML IJ SOLN
100.0000 ug | INTRAMUSCULAR | Status: AC | PRN
  Administered 2013-02-08 (×2): 100 ug via INTRAVENOUS
  Filled 2013-02-08 (×2): qty 2

## 2013-02-08 MED ORDER — ONDANSETRON HCL 4 MG/2ML IJ SOLN: 4.0000 mg | INTRAMUSCULAR | Status: DC | PRN

## 2013-02-08 MED ORDER — ONDANSETRON HCL 4 MG/2ML IJ SOLN
4.0000 mg | INTRAMUSCULAR | Status: DC | PRN
  Administered 2013-02-08: 4 mg via INTRAVENOUS
  Filled 2013-02-08 (×2): qty 2

## 2013-02-08 MED ORDER — HEPARIN SODIUM (PORCINE) 5000 UNIT/ML IJ SOLN
5000.0000 [IU] | Freq: Three times a day (TID) | INTRAMUSCULAR | Status: DC
  Administered 2013-02-08 – 2013-02-10 (×4): 5000 [IU] via SUBCUTANEOUS
  Filled 2013-02-08 (×10): qty 1

## 2013-02-08 MED ORDER — ACETAMINOPHEN 650 MG RE SUPP: 650.0000 mg | Freq: Four times a day (QID) | RECTAL | Status: DC | PRN

## 2013-02-08 MED ORDER — DICYCLOMINE HCL 10 MG/ML IM SOLN
20.0000 mg | Freq: Once | INTRAMUSCULAR | Status: AC
  Administered 2013-02-08: 20 mg via INTRAMUSCULAR
  Filled 2013-02-08: qty 2

## 2013-02-08 MED ORDER — ONDANSETRON HCL 4 MG PO TABS: 4.0000 mg | ORAL_TABLET | Freq: Four times a day (QID) | ORAL | Status: DC | PRN

## 2013-02-08 MED ORDER — METOCLOPRAMIDE HCL 5 MG/ML IJ SOLN: 10.0000 mg | Freq: Once | INTRAMUSCULAR | Status: DC

## 2013-02-08 MED ORDER — ONDANSETRON HCL 4 MG/2ML IJ SOLN
4.0000 mg | Freq: Four times a day (QID) | INTRAMUSCULAR | Status: DC | PRN
  Administered 2013-02-08 – 2013-02-09 (×3): 4 mg via INTRAVENOUS
  Filled 2013-02-08 (×3): qty 2

## 2013-02-08 NOTE — ED Notes (Signed)
Pt boyfriend enter room pt began to hyperventilate. Maintain SPo2 100%. Comfort measures attempted. Instructed to slow breathing with nose mouth breathing. Pt sleeping before boyfriend entered room

## 2013-02-08 NOTE — ED Notes (Addendum)
Pt arrived via ems for n/v/d since this am. Pt actively vomiting and will not answer questions readily. Pt sitting on knees on stretcher bent over. EMS states uncooperative with iv start. Given 4mg  zofran Im en route. Pt moaning and moving around in stretcher.  MD informed of patient.

## 2013-02-08 NOTE — Progress Notes (Signed)
Utilization Review completed.  Baruch Lewers RN CM  

## 2013-02-08 NOTE — ED Notes (Signed)
Bed: [id number] Expected date:  Expected time:  Means of arrival:  Comments: EMS-vomiting

## 2013-02-08 NOTE — ED Notes (Signed)
Pt ws given sprite, pt vomiting after drinking.

## 2013-02-08 NOTE — ED Notes (Signed)
Pt return from xray.

## 2013-02-08 NOTE — H&P (Addendum)
[facility] History and Physical  Tracey Daniel DEY:[id number] DOB: 1980/09/03 DOA: 02/08/2013  Referring physician: Emergency Department PCP: [name], MD  Specialists:   Chief Complaint: Nausea/Vomiting  HPI: Tracey Daniel is a 33 y.o. female  With a hx of chronic pain, hx of recurrent abdominal pain, gastroparesis, and pylorospasm requiring Botox injections who presents to the ED with intractable n/v that started 2-3 days ago. In the ED, the patient was noted to have an unremarkable abd xray. However, the patient was unable to tolerate PO intake. As such, the hospitalists were consulted for consideration for admission.  Of note, pt has been followed by [facility], last visit on 12/19/12. Last recommended to start Opp with referral made to [facility] for repeat botox injection.  Review of Systems:  Per above, the remainder of the 10pt ros reviewed and are neg  Past Medical History  Diagnosis Date  . Migraines   . Osteoporosis   . Seasonal allergies   . Sinusitis   . Back pain, chronic   . Nicotine addiction   . Constipation   . Vitamin D deficiency   . Gastritis   . Gastroparesis   . Pyloric spasm 03/30/2011  . Gastric outlet obstruction   . COPD (chronic obstructive pulmonary disease)   . Asthma   . Substance abuse 2008    marijuana  . Peptic ulcer disease   . Depression 2000    h/o suicidal ideation  . Anemia of other chronic disease 11/29/2012  . Chronic abdominal pain   . Nausea and vomiting     recurrent   Past Surgical History  Procedure Laterality Date  . Cholecystectomy      ?2002  . Laser surgery on cervix    . Carpal tunnel release      left hand  . Esophagogastroduodenoscopy  12/25/2010    Dorothyann Peng, MD;  moderate gastritis, ?goo secondary to pylorspasm. BX showed reactive gstropathy no h.pyori, SB mucosa with intramucosal lymphocytosis and partial villous blunting (TTG 4.0 normal)  . Esophageal  dilation  12/25/2010    Procedure: ESOPHAGEAL DILATION;  Surgeon: Dorothyann Peng, MD;  [facility]: [facility];  Service: Endoscopy;;  . Esophagogastroduodenoscopy N/A 11/14/2012    DJS:HFWYO hiatal hernia. Abnormal gastric mucosa of  uncertain significance-status post biopsy. Subjectively, patient may have recurrent symptomatic, pylorospasm.   Social History:  reports that she has been smoking Cigarettes.  She has a 7.5 pack-year smoking history. She does not have any smokeless tobacco history on file. She reports that she uses illicit drugs (Marijuana). She reports that she does not drink alcohol.  where does patient live--home, ALF, SNF? and with whom if at home?  Can patient participate in ADLs?  Allergies  Allergen Reactions  . Latex Itching and Other (See Comments)    "burning" where the tape goes  . Penicillins     unknown    Family History  Problem Relation Age of Onset  . Diabetes Father   . Liver disease Father     liver transplant at [facility], age 5  . Colon cancer Neg Hx   . Lung cancer Mother     (be sure to complete)  Prior to Admission medications   Medication Sig Start Date End Date Taking? Authorizing Provider  albuterol (PROVENTIL HFA;VENTOLIN HFA) 108 (90 BASE) MCG/ACT inhaler Inhale 2 puffs into the lungs every 6 (six) hours as needed. Shortness of breath 11/14/12 05/27/15  [name], MD  budesonide-formoterol (  SYMBICORT) 80-4.5 MCG/ACT inhaler Inhale 2 puffs into the lungs 2 (two) times daily. 11/14/12 05/27/15  Kerri Perches, MD  calcium-vitamin D (OSCAL 500/200 D-3) 500-200 MG-UNIT per tablet Take 1 tablet by mouth 2 (two) times daily. 11/14/12 11/14/13  Kerri Perches, MD  Cholecalciferol (VITAMIN D3) 2000 UNITS CHEW Chew by mouth.    Historical Provider, MD  clobetasol cream (TEMOVATE) 0.05 % Apply 1 application topically 2 (two) times daily. Apply to rash    Historical Provider, MD  cyclobenzaprine (FLEXERIL) 10 MG tablet Take 10 mg by mouth 3  (three) times daily as needed. For muscle spasms    Historical Provider, MD  dexlansoprazole (DEXILANT) 60 MG capsule Take 1 capsule (60 mg total) by mouth daily before breakfast. 12/19/12   Tiffany Kocher, PA-C  DULoxetine (CYMBALTA) 30 MG capsule Take 1 capsule (30 mg total) by mouth daily. 03/02/12 09/12/14  Kerri Perches, MD  ergocalciferol (VITAMIN D2) 50000 UNITS capsule Take 1 capsule (50,000 Units total) by mouth once a week. One capsule once weekly 11/14/12   Kerri Perches, MD  estradiol (ESTRACE VAGINAL) 0.1 MG/GM vaginal cream Place 2 g vaginally daily. As directed    Historical Provider, MD  feeding supplement (ENSURE CLINICAL STRENGTH) LIQD Take 237 mLs by mouth 3 (three) times daily with meals. 01/01/11   Nishant Dhungel, MD  ferrous sulfate 325 (65 FE) MG tablet Take 325 mg by mouth daily with breakfast.    Historical Provider, MD  fluticasone (CUTIVATE) 0.05 % cream Apply 1 application topically 2 (two) times daily.    Historical Provider, MD  fluticasone (FLONASE) 50 MCG/ACT nasal spray Place 2 sprays into both nostrils daily.    Historical Provider, MD  HYDROcodone-acetaminophen (NORCO) 7.5-325 MG per tablet Take 1 tablet by mouth every 4 (four) hours as needed. For pain    Historical Provider, MD  Linaclotide (LINZESS) 145 MCG CAPS capsule Take 1 capsule (145 mcg total) by mouth daily. 12/19/12   Tiffany Kocher, PA-C  loratadine (CLARITIN) 10 MG tablet Take 1 tablet (10 mg total) by mouth daily. 10/09/11   Kerri Perches, MD  Multiple Vitamin (MULTIVITAMIN WITH MINERALS) TABS tablet Take 1 tablet by mouth daily.    Historical Provider, MD  promethazine (PHENERGAN) 25 MG tablet Take 1 tablet (25 mg total) by mouth every 6 (six) hours as needed for nausea. 11/09/12   Tiffany Kocher, PA-C  simvastatin (ZOCOR) 20 MG tablet Take 20 mg by mouth daily.    Historical Provider, MD  sucralfate (CARAFATE) 1 GM/10ML suspension Take 10 mLs (1 g total) by mouth 4 (four) times daily -   with meals and at bedtime. 11/30/12   Ellouise Newer, PA-C   Physical Exam: Filed Vitals:   02/08/13 1500 02/08/13 1520 02/08/13 1530 02/08/13 1600  BP: 119/56 119/56 105/49 106/45  Pulse: 85 84 51 64  Temp:  98.1 F (36.7 C)    TempSrc:  Oral    Resp:  20    SpO2:  100%       General:  Awake, in nad  Eyes: PERRL B  ENT: membranes moist, dentition fair  Neck: trachea midline, neck supple  Cardiovascular: regular, s1, s2  Respiratory: normal resp effort, no wheezing  Abdomen: soft, nondistended  Skin: normal skin turgor, no abnormal skin lesions seen  Musculoskeletal: perfused, no clubbing  Psychiatric: mood affect normal // no auditory/visual hallucinations  Neurologic: cn2-12 grossly intact, strength/sensation  Labs on Admission:  Basic Metabolic Panel:  Recent Labs Lab 02/08/13 1323  NA 139  K 4.8  CL 98  CO2 23  GLUCOSE 131*  BUN 18  CREATININE 0.72  CALCIUM 9.7   Liver Function Tests:  Recent Labs Lab 02/08/13 1323  AST 36  ALT 16  ALKPHOS 81  BILITOT 0.3  PROT 8.2  ALBUMIN 4.6    Recent Labs Lab 02/08/13 1323  LIPASE 30   No results found for this basename: AMMONIA,  in the last 168 hours CBC:  Recent Labs Lab 02/08/13 1323  WBC 13.7*  NEUTROABS 11.1*  HGB 12.4  HCT 37.1  MCV 89.0  PLT 181   Cardiac Enzymes: No results found for this basename: CKTOTAL, CKMB, CKMBINDEX, TROPONINI,  in the last 168 hours  BNP (last 3 results) No results found for this basename: PROBNP,  in the last 8760 hours CBG: No results found for this basename: GLUCAP,  in the last 168 hours  Radiological Exams on Admission: Dg Abd Acute W/chest  02/08/2013   CLINICAL DATA:  Abdominal pain for 2 days.  Nausea and vomiting.  EXAM: ACUTE ABDOMEN SERIES (ABDOMEN 2 VIEW & CHEST 1 VIEW)  COMPARISON:  CT chest 06/30/2011. Chest and two views abdomen 03/25/2011.  FINDINGS: Single view of the chest demonstrates clear lungs and normal heart size. No  pneumothorax or pleural effusion.  Two views of the abdomen show no free intraperitoneal air. The bowel gas pattern is nonobstructive. Cholecystectomy clips are noted.  IMPRESSION: No acute finding chest or abdomen.   Electronically Signed   By: Inge Rise M.D.   On: 02/08/2013 14:13   Assessment/Plan Principal Problem:   Intractable nausea and vomiting Active Problems:   Chronic obstructive asthma, unspecified   Gastric outlet obstruction   Pyloric spasm   1. Intractable N/V 1. Hx of pyloric spasm requiring botox injection in 2012 at [facility], was referred for repeat botox per 12/19/12 GI clinic note 2. abd xray reviewed - no signs of obstruction 3. Will keep NPO with basal IVF for now and allow bowel rest 4. Consider resuming PO meds if/when pt is able to tolerate oral intake 5. Admit to med-surg 6. Given above hx, would consider GI consult for further recs 2. Asthma 1. Stable 2. No wheezing on exam 3. Hx of pyloric spasm 1. Per above 2. Consider GI consult 4. DVT prophylaxis 1. Heparin subQ  Code Status: Full (must indicate code status--if unknown or must be presumed, indicate so) Family Communication: Pt in room (indicate person spoken with, if applicable, with phone number if by telephone) Disposition Plan: Pending (indicate anticipated LOS)  Time spent: 83min  Gwendalynn Eckstrom, [facility] Pager [phone]  If 7PM-7AM, please contact night-coverage [email or web address] Password [facility] 02/08/2013, 5:35 PM

## 2013-02-08 NOTE — ED Provider Notes (Signed)
CSN: [id number]     Arrival date & time 02/08/13  1223 History   First MD Initiated Contact with Patient 02/08/13 1227     Chief Complaint  Patient presents with  . Emesis    HPI Pt was seen at 1250.  Per pt, c/o gradual onset and persistence of constant acute flair of her chronic upper abd "pain" for the past 2 days.  Has been associated with multiple intermittent episodes of N/V.  Describes the abd pain as "cramping," "sharp," and per her usual chronic pain pattern. Endorses she has hx of gastroparesis and pyloric "spasm," tx with Botox injections by [facility]. Denies diarrhea, no fevers, no back pain, no rash, no CP/SOB, no black or blood in stools or emesis.       Past Medical History  Diagnosis Date  . Migraines   . Osteoporosis   . Seasonal allergies   . Sinusitis   . Back pain, chronic   . Nicotine addiction   . Constipation   . Vitamin D deficiency   . Gastritis   . Gastroparesis   . Pyloric spasm 03/30/2011  . Gastric outlet obstruction   . COPD (chronic obstructive pulmonary disease)   . Asthma   . Substance abuse 2008    marijuana  . Peptic ulcer disease   . Depression 2000    h/o suicidal ideation  . Anemia of other chronic disease 11/29/2012  . Chronic abdominal pain   . Nausea and vomiting     recurrent   Past Surgical History  Procedure Laterality Date  . Cholecystectomy      ?2002  . Laser surgery on cervix    . Carpal tunnel release      left hand  . Esophagogastroduodenoscopy  12/25/2010    Dorothyann Peng, MD;  moderate gastritis, ?goo secondary to pylorspasm. BX showed reactive gstropathy no h.pyori, SB mucosa with intramucosal lymphocytosis and partial villous blunting (TTG 4.0 normal)  . Esophageal dilation  12/25/2010    Procedure: ESOPHAGEAL DILATION;  Surgeon: Dorothyann Peng, MD;  Location: [facility];  Service: Endoscopy;;  . Esophagogastroduodenoscopy N/A 11/14/2012    [id number] hiatal hernia. Abnormal gastric mucosa of  uncertain  significance-status post biopsy. Subjectively, patient may have recurrent symptomatic, pylorospasm.   Family History  Problem Relation Age of Onset  . Diabetes Father   . Liver disease Father     liver transplant at [facility], age 95  . Colon cancer Neg Hx   . Lung cancer Mother    History  Substance Use Topics  . Smoking status: Current Every Day Smoker -- 0.50 packs/day for 15 years    Types: Cigarettes  . Smokeless tobacco: Not on file     Comment: quit 2011  . Alcohol Use: No     Comment: none since July. Periodic heavy drinker for months at a time.    Review of Systems ROS: Statement: All systems negative except as marked or noted in the HPI; Constitutional: Negative for fever and chills. ; ; Eyes: Negative for eye pain, redness and discharge. ; ; ENMT: Negative for ear pain, hoarseness, nasal congestion, sinus pressure and sore throat. ; ; Cardiovascular: Negative for chest pain, palpitations, diaphoresis, dyspnea and peripheral edema. ; ; Respiratory: Negative for cough, wheezing and stridor. ; ; Gastrointestinal: +N/V, abd pain. Negative for diarrhea, blood in stool, hematemesis, jaundice and rectal bleeding. . ; ; Genitourinary: Negative for dysuria, flank pain and hematuria. ; ; Musculoskeletal: Negative for back pain  and neck pain. Negative for swelling and trauma.; ; Skin: Negative for pruritus, rash, abrasions, blisters, bruising and skin lesion.; ; Neuro: Negative for headache, lightheadedness and neck stiffness. Negative for weakness, altered level of consciousness , altered mental status, extremity weakness, paresthesias, involuntary movement, seizure and syncope.       Allergies  Latex and Penicillins  Home Medications   Current Outpatient Rx  Name  Route  Sig  Dispense  Refill  . albuterol (PROVENTIL HFA;VENTOLIN HFA) 108 (90 BASE) MCG/ACT inhaler   Inhalation   Inhale 2 puffs into the lungs every 6 (six) hours as needed. Shortness of breath   6.7 g   4   .  budesonide-formoterol (SYMBICORT) 80-4.5 MCG/ACT inhaler   Inhalation   Inhale 2 puffs into the lungs 2 (two) times daily.   1 Inhaler   4   . calcium-vitamin D (OSCAL 500/200 D-3) 500-200 MG-UNIT per tablet   Oral   Take 1 tablet by mouth 2 (two) times daily.   180 tablet   3   . Cholecalciferol (VITAMIN D3) 2000 UNITS CHEW   Oral   Chew by mouth.         . clobetasol cream (TEMOVATE) 0.05 %   Topical   Apply 1 application topically 2 (two) times daily. Apply to rash         . cyclobenzaprine (FLEXERIL) 10 MG tablet   Oral   Take 10 mg by mouth 3 (three) times daily as needed. For muscle spasms         . dexlansoprazole (DEXILANT) 60 MG capsule   Oral   Take 1 capsule (60 mg total) by mouth daily before breakfast.   31 capsule   5   . DULoxetine (CYMBALTA) 30 MG capsule   Oral   Take 1 capsule (30 mg total) by mouth daily.   30 capsule   4   . ergocalciferol (VITAMIN D2) 50000 UNITS capsule   Oral   Take 1 capsule (50,000 Units total) by mouth once a week. One capsule once weekly   12 capsule   1   . estradiol (ESTRACE VAGINAL) 0.1 MG/GM vaginal cream   Vaginal   Place 2 g vaginally daily. As directed         . feeding supplement (ENSURE CLINICAL STRENGTH) LIQD   Oral   Take 237 mLs by mouth 3 (three) times daily with meals.   10 Bottle   3   . ferrous sulfate 325 (65 FE) MG tablet   Oral   Take 325 mg by mouth daily with breakfast.         . fluticasone (CUTIVATE) 0.05 % cream   Topical   Apply 1 application topically 2 (two) times daily.         . fluticasone (FLONASE) 50 MCG/ACT nasal spray   Each Nare   Place 2 sprays into both nostrils daily.         Marland Kitchen HYDROcodone-acetaminophen (NORCO) 7.5-325 MG per tablet   Oral   Take 1 tablet by mouth every 4 (four) hours as needed. For pain         . Linaclotide (LINZESS) 145 MCG CAPS capsule   Oral   Take 1 capsule (145 mcg total) by mouth daily.   30 capsule   1   . loratadine  (CLARITIN) 10 MG tablet   Oral   Take 1 tablet (10 mg total) by mouth daily.   30 tablet   5   .  Multiple Vitamin (MULTIVITAMIN WITH MINERALS) TABS tablet   Oral   Take 1 tablet by mouth daily.         . promethazine (PHENERGAN) 25 MG tablet   Oral   Take 1 tablet (25 mg total) by mouth every 6 (six) hours as needed for nausea.   30 tablet   0   . simvastatin (ZOCOR) 20 MG tablet   Oral   Take 20 mg by mouth daily.         . sucralfate (CARAFATE) 1 GM/10ML suspension   Oral   Take 10 mLs (1 g total) by mouth 4 (four) times daily -  with meals and at bedtime.   420 mL   1    BP 103/61  Pulse 80  Temp(Src) 98 F (36.7 C) (Oral)  Resp 16  SpO2 100%  LMP 01/19/1997 Physical Exam 1255: Physical examination:  Nursing notes reviewed; Vital signs and O2 SAT reviewed;  Constitutional: Well developed, Well nourished, Well hydrated, Uncomfortable appearing.; Head:  Normocephalic, atraumatic; Eyes: EOMI, PERRL, No scleral icterus; ENMT: Mouth and pharynx normal, Mucous membranes moist; Neck: Supple, Full range of motion, No lymphadenopathy; Cardiovascular: Regular rate and rhythm, No gallop; Respiratory: Breath sounds clear & equal bilaterally, No wheezes.  Speaking full sentences with ease, Normal respiratory effort/excursion; Chest: Nontender, Movement normal; Abdomen: Soft, +mid-epigastric tenderness to palp. No rebound or guarding. Nondistended, Normal bowel sounds; Genitourinary: No CVA tenderness; Extremities: Pulses normal, No tenderness, No edema, No calf edema or asymmetry.; Neuro: AA&Ox3, Major CN grossly intact.  Speech clear. No gross focal motor or sensory deficits in extremities.; Skin: Color normal, Warm, Dry.   ED Course  Procedures   EKG Interpretation   None       MDM  MDM Reviewed: previous chart, nursing note and vitals Reviewed previous: labs Interpretation: labs and x-ray     Results for orders placed during the hospital encounter of 02/08/13   URINALYSIS, ROUTINE W REFLEX MICROSCOPIC      Result Value Range   Color, Urine YELLOW  YELLOW   APPearance TURBID (*) CLEAR   Specific Gravity, Urine 1.019  1.005 - 1.030   pH 8.5 (*) 5.0 - 8.0   Glucose, UA NEGATIVE  NEGATIVE mg/dL   Hgb urine dipstick NEGATIVE  NEGATIVE   Bilirubin Urine NEGATIVE  NEGATIVE   Ketones, ur NEGATIVE  NEGATIVE mg/dL   Protein, ur 30 (*) NEGATIVE mg/dL   Urobilinogen, UA 0.2  0.0 - 1.0 mg/dL   Nitrite NEGATIVE  NEGATIVE   Leukocytes, UA NEGATIVE  NEGATIVE  PREGNANCY, URINE      Result Value Range   Preg Test, Ur NEGATIVE  NEGATIVE  CBC WITH DIFFERENTIAL      Result Value Range   WBC 13.7 (*) 4.0 - 10.5 K/uL   RBC 4.17  3.87 - 5.11 MIL/uL   Hemoglobin 12.4  12.0 - 15.0 g/dL   HCT 37.1  36.0 - 46.0 %   MCV 89.0  78.0 - 100.0 fL   MCH 29.7  26.0 - 34.0 pg   MCHC 33.4  30.0 - 36.0 g/dL   RDW 13.5  11.5 - 15.5 %   Platelets 181  150 - 400 K/uL   Neutrophils Relative % 81 (*) 43 - 77 %   Neutro Abs 11.1 (*) 1.7 - 7.7 K/uL   Lymphocytes Relative 13  12 - 46 %   Lymphs Abs 1.8  0.7 - 4.0 K/uL   Monocytes Relative 5  3 -  12 %   Monocytes Absolute 0.7  0.1 - 1.0 K/uL   Eosinophils Relative 0  0 - 5 %   Eosinophils Absolute 0.0  0.0 - 0.7 K/uL   Basophils Relative 0  0 - 1 %   Basophils Absolute 0.0  0.0 - 0.1 K/uL  COMPREHENSIVE METABOLIC PANEL      Result Value Range   Sodium 139  137 - 147 mEq/L   Potassium 4.8  3.7 - 5.3 mEq/L   Chloride 98  96 - 112 mEq/L   CO2 23  19 - 32 mEq/L   Glucose, Bld 131 (*) 70 - 99 mg/dL   BUN 18  6 - 23 mg/dL   Creatinine, Ser 0.72  0.50 - 1.10 mg/dL   Calcium 9.7  8.4 - 10.5 mg/dL   Total Protein 8.2  6.0 - 8.3 g/dL   Albumin 4.6  3.5 - 5.2 g/dL   AST 36  0 - 37 U/L   ALT 16  0 - 35 U/L   Alkaline Phosphatase 81  39 - 117 U/L   Total Bilirubin 0.3  0.3 - 1.2 mg/dL   GFR calc non Af Amer >90  >90 mL/min   GFR calc Af Amer >90  >90 mL/min  LIPASE, BLOOD      Result Value Range   Lipase 30  11 - 59 U/L   URINE MICROSCOPIC-ADD ON      Result Value Range   Urine-Other AMORPHOUS URATES/PHOSPHATES     Dg Abd Acute W/chest 02/08/2013   CLINICAL DATA:  Abdominal pain for 2 days.  Nausea and vomiting.  EXAM: ACUTE ABDOMEN SERIES (ABDOMEN 2 VIEW & CHEST 1 VIEW)  COMPARISON:  CT chest 06/30/2011. Chest and two views abdomen 03/25/2011.  FINDINGS: Single view of the chest demonstrates clear lungs and normal heart size. No pneumothorax or pleural effusion.  Two views of the abdomen show no free intraperitoneal air. The bowel gas pattern is nonobstructive. Cholecystectomy clips are noted.  IMPRESSION: No acute finding chest or abdomen.   Electronically Signed   By: Inge Rise M.D.   On: 02/08/2013 14:13    1650:  Pt continues to c/o abd pain, nausea and vomiting despite multiple doses of IV meds. Unable to tol PO without N/V. Dx and testing d/w pt and family.  Questions answered.  Verb understanding, agreeable to observation admit. T/C to [facility]. Wyline Copas, case discussed, including:  HPI, pertinent PM/SHx, VS/PE, dx testing, ED course and treatment:  Agreeable to observation admit, requests to write temporary orders, obtain medical bed to team 8.   Alfonzo Feller, DO 02/11/13 [phone]

## 2013-02-08 NOTE — Progress Notes (Signed)
Pt arrived from the ED alert and oriented, VSS, husband at bedside, continues to flail herself in the bed. Skin intact. Pt settled into the room. Will report off to the night RN. Continue to monitor until change of shift.

## 2013-02-09 ENCOUNTER — Inpatient Hospital Stay (HOSPITAL_COMMUNITY): Payer: Medicare Other | Admitting: Anesthesiology

## 2013-02-09 ENCOUNTER — Encounter (HOSPITAL_COMMUNITY)
Admission: AD | Disposition: A | Discharge status: Home / Self Care | Payer: Self-pay | Source: Home / Self Care | Attending: Family Medicine

## 2013-02-09 ENCOUNTER — Encounter (HOSPITAL_COMMUNITY): Payer: Medicare Other | Admitting: Anesthesiology

## 2013-02-09 ENCOUNTER — Encounter (HOSPITAL_COMMUNITY): Payer: Self-pay

## 2013-02-09 DIAGNOSIS — R1115 Cyclical vomiting syndrome unrelated to migraine: Secondary | ICD-10-CM | POA: Diagnosis not present

## 2013-02-09 DIAGNOSIS — R109 Unspecified abdominal pain: Secondary | ICD-10-CM | POA: Diagnosis not present

## 2013-02-09 DIAGNOSIS — K313 Pylorospasm, not elsewhere classified: Secondary | ICD-10-CM | POA: Diagnosis not present

## 2013-02-09 DIAGNOSIS — M549 Dorsalgia, unspecified: Secondary | ICD-10-CM | POA: Diagnosis not present

## 2013-02-09 DIAGNOSIS — K3184 Gastroparesis: Secondary | ICD-10-CM | POA: Diagnosis not present

## 2013-02-09 DIAGNOSIS — J45991 Cough variant asthma: Secondary | ICD-10-CM

## 2013-02-09 DIAGNOSIS — R112 Nausea with vomiting, unspecified: Secondary | ICD-10-CM | POA: Diagnosis not present

## 2013-02-09 DIAGNOSIS — J449 Chronic obstructive pulmonary disease, unspecified: Secondary | ICD-10-CM

## 2013-02-09 DIAGNOSIS — K311 Adult hypertrophic pyloric stenosis: Secondary | ICD-10-CM | POA: Diagnosis not present

## 2013-02-09 DIAGNOSIS — R633 Feeding difficulties, unspecified: Secondary | ICD-10-CM | POA: Diagnosis not present

## 2013-02-09 DIAGNOSIS — K3189 Other diseases of stomach and duodenum: Secondary | ICD-10-CM | POA: Diagnosis not present

## 2013-02-09 DIAGNOSIS — E559 Vitamin D deficiency, unspecified: Secondary | ICD-10-CM | POA: Diagnosis not present

## 2013-02-09 HISTORY — PX: BOTOX INJECTION: SHX5754

## 2013-02-09 HISTORY — PX: ESOPHAGOGASTRODUODENOSCOPY (EGD) WITH PROPOFOL: SHX5813

## 2013-02-09 HISTORY — PX: BALLOON DILATION: SHX5330

## 2013-02-09 LAB — COMPREHENSIVE METABOLIC PANEL
ALT: 14 U/L (ref 0–35)
AST: 29 U/L (ref 0–37)
Albumin: 3.9 g/dL (ref 3.5–5.2)
Alkaline Phosphatase: 75 U/L (ref 39–117)
BUN: 11 mg/dL (ref 6–23)
CO2: 22 mEq/L (ref 19–32)
Calcium: 8.8 mg/dL (ref 8.4–10.5)
Chloride: 101 mEq/L (ref 96–112)
Creatinine, Ser: 0.72 mg/dL (ref 0.50–1.10)
GFR calc non Af Amer: >90 mL/min (ref >90)
GLUCOSE: 121 mg/dL — AB (ref 70–99)
Potassium: 3.4 mEq/L — ABNORMAL LOW (ref 3.7–5.3)
SODIUM: 137 meq/L (ref 137–147)
TOTAL PROTEIN: 7.2 g/dL (ref 6.0–8.3)
Total Bilirubin: 0.3 mg/dL (ref 0.3–1.2)

## 2013-02-09 LAB — URINE MICROSCOPIC-ADD ON

## 2013-02-09 LAB — CBC
HEMATOCRIT: 32 % — AB (ref 36.0–46.0)
HEMOGLOBIN: 10.6 g/dL — AB (ref 12.0–15.0)
MCH: 29.5 pg (ref 26.0–34.0)
MCHC: 33.1 g/dL (ref 30.0–36.0)
MCV: 89.1 fL (ref 78.0–100.0)
Platelets: 145 10*3/uL — ABNORMAL LOW (ref 150–400)
RBC: 3.59 MIL/uL — ABNORMAL LOW (ref 3.87–5.11)
RDW: 13.5 % (ref 11.5–15.5)
WBC: 10.8 10*3/uL — AB (ref 4.0–10.5)

## 2013-02-09 SURGERY — ESOPHAGOGASTRODUODENOSCOPY (EGD) WITH PROPOFOL
Anesthesia: Monitor Anesthesia Care

## 2013-02-09 MED ORDER — GI COCKTAIL ~~LOC~~
30.0000 mL | Freq: Once | ORAL | Status: AC
  Administered 2013-02-09: 30 mL via ORAL
  Filled 2013-02-09: qty 30

## 2013-02-09 MED ORDER — KETAMINE HCL 10 MG/ML IJ SOLN
INTRAMUSCULAR | Status: DC | PRN
  Administered 2013-02-09: 30 mg via INTRAVENOUS
  Administered 2013-02-09: 10 mg via INTRAVENOUS
  Administered 2013-02-09: 20 mg via INTRAVENOUS
  Administered 2013-02-09: 30 mg via INTRAVENOUS
  Administered 2013-02-09: 10 mg via INTRAVENOUS

## 2013-02-09 MED ORDER — DEXAMETHASONE SODIUM PHOSPHATE 10 MG/ML IJ SOLN
INTRAMUSCULAR | Status: DC | PRN
  Administered 2013-02-09: 10 mg via INTRAVENOUS

## 2013-02-09 MED ORDER — PROPOFOL 10 MG/ML IV BOLUS
INTRAVENOUS | Status: AC
  Filled 2013-02-09: qty 20

## 2013-02-09 MED ORDER — PROPOFOL INFUSION 10 MG/ML OPTIME
INTRAVENOUS | Status: DC | PRN
  Administered 2013-02-09: 50 ug/kg/min via INTRAVENOUS

## 2013-02-09 MED ORDER — ONDANSETRON HCL 4 MG/2ML IJ SOLN
INTRAMUSCULAR | Status: DC | PRN
  Administered 2013-02-09: 4 mg via INTRAVENOUS

## 2013-02-09 MED ORDER — SODIUM CHLORIDE 0.9 % IV SOLN: INTRAVENOUS | Status: DC

## 2013-02-09 MED ORDER — BUTAMBEN-TETRACAINE-BENZOCAINE 2-2-14 % EX AERO
INHALATION_SPRAY | CUTANEOUS | Status: DC | PRN
  Administered 2013-02-09: 2 via TOPICAL

## 2013-02-09 MED ORDER — PROMETHAZINE HCL 25 MG/ML IJ SOLN
6.2500 mg | INTRAMUSCULAR | Status: DC | PRN
Start: 2013-02-09 — End: 2013-02-10

## 2013-02-09 MED ORDER — LACTATED RINGERS IV SOLN
INTRAVENOUS | Status: DC | PRN
  Administered 2013-02-09: 13:00:00 via INTRAVENOUS

## 2013-02-09 MED ORDER — PROMETHAZINE HCL 25 MG/ML IJ SOLN
12.5000 mg | Freq: Four times a day (QID) | INTRAMUSCULAR | Status: DC | PRN
  Administered 2013-02-09 (×2): 12.5 mg via INTRAVENOUS
  Filled 2013-02-09 (×2): qty 1

## 2013-02-09 MED ORDER — SODIUM CHLORIDE 0.9 % IJ SOLN
100.0000 [IU] | Freq: Once | INTRAMUSCULAR | Status: DC
  Filled 2013-02-09: qty 100

## 2013-02-09 MED ORDER — MIDAZOLAM HCL 2 MG/2ML IJ SOLN
INTRAMUSCULAR | Status: AC
  Filled 2013-02-09: qty 2

## 2013-02-09 MED ORDER — MIDAZOLAM HCL 5 MG/5ML IJ SOLN
INTRAMUSCULAR | Status: DC | PRN
  Administered 2013-02-09: 2 mg via INTRAVENOUS

## 2013-02-09 MED ORDER — PROPOFOL 10 MG/ML IV BOLUS
INTRAVENOUS | Status: AC
Start: 2013-02-09 — End: 2013-02-09
  Filled 2013-02-09: qty 20

## 2013-02-09 SURGICAL SUPPLY — 15 items

## 2013-02-09 NOTE — Anesthesia Postprocedure Evaluation (Signed)
  Anesthesia Post-op Note  Patient: Tracey Daniel  Procedure(s) Performed: Procedure(s) (LRB): ESOPHAGOGASTRODUODENOSCOPY (EGD) WITH PROPOFOL (N/A) BOTOX INJECTION (N/A) BALLOON DILATION (N/A)  Patient Location: PACU  Anesthesia Type: MAC  Level of Consciousness: awake and alert   Airway and Oxygen Therapy: Patient Spontanous Breathing  Post-op Pain: mild  Post-op Assessment: Post-op Vital signs reviewed, Patient's Cardiovascular Status Stable, Respiratory Function Stable, Patent Airway and No signs of Nausea or vomiting  Last Vitals:  Filed Vitals:   02/09/13 1541  BP: 148/95  Pulse: 79  Temp: 37 C  Resp: 18    Post-op Vital Signs: stable   Complications: No apparent anesthesia complications

## 2013-02-09 NOTE — Progress Notes (Signed)
INITIAL NUTRITION ASSESSMENT  DOCUMENTATION CODES Per approved criteria  -Not Applicable   INTERVENTION: -Recommend Ensure Complete po TID, each supplement provides 350 kcal and 13 grams of protein -Provided gastroparesis nutrition therapy handout -Consider low fiber diet as tolerated d/t pt's hx of gastroparesis and for post procedure tolerance   NUTRITION DIAGNOSIS: Inadequate oral intake related to inability to eat as evidenced by NPO status.   Goal: Pt to meet >/= 90% of their estimated nutrition needs    Monitor:  Diet advancement, total protein/energy intake, swallow profile, GI profile, labs, weights  Reason for Assessment: MST  33 y.o. female  Admitting Dx: Intractable nausea and vomiting  ASSESSMENT:  Pt with a hx of chronic pain, hx of recurrent abdominal pain, gastroparesis, and pylorospasm requiring Botox injections who presents to the ED with intractable n/v that started 2-3 days ago. In the E, the patient was noted to have an unremarkable abd xray. However, the patient was unable to tolerate PO intake. As such, the hospitalists were consulted for consideration for admission.  -Pt s/p Botox injection of the pylorus -Has had weight fluctuation over past 6 months. Usual weight is around 140 lbs, lowest weight was 118 lbs in 07/2012 -Reported slight increase in PO intake in past 6 months that may have contributed to weight gain.  -PO intake has decreased in last 2-3 days d/t n/v, has only been able to eat 1-2 bites of meals. Normally drinks Ensure TID w/meals and tolerates soft foods diet. Denied any current nausea and was eager for diet advancement. Will recommend to continue with supplement regimen -Has had chronic nausea, and swallowing difficulty for past two years. -Pt also with hx of gastroparesis and chronic constipation. MD noted constipation improved, is having several BMs aw eek -Provided pt with "Gastroparesis Nutrition Therapy" handout from the [employer]. Pt noted that she had never been educated regarding gastroparesis nutrition therapies -Diet recall indicated large amount of white breads, pastas and fried foods. Encouraged pt to baked/broil/roast or stir fry proteins and to avoid pastries. Recommended to continue to limit wheat products as constipation has resolved and to assist with pt's hx of delayed emptying     Height: Ht Readings from Last 1 Encounters:  11/14/12 5\' 6"  (1.676 m)    Weight: Wt Readings from Last 1 Encounters:  12/19/12 141 lb (63.957 kg)    Ideal Body Weight: 130 lbs  % Ideal Body Weight: 108%  Wt Readings from Last 10 Encounters:  12/19/12 141 lb (63.957 kg)  11/30/12 134 lb 14.4 oz (61.19 kg)  11/14/12 136 lb (61.689 kg)  11/14/12 136 lb (61.689 kg)  11/14/12 136 lb (61.689 kg)  11/09/12 132 lb 3.2 oz (59.966 kg)  07/27/12 126 lb (57.153 kg)  04/01/12 145 lb 6.4 oz (65.953 kg)  03/02/12 146 lb 12.8 oz (66.588 kg)  12/02/11 148 lb 6.4 oz (67.314 kg)    Usual Body Weight: 140 lbs  % Usual Body Weight: 100%  BMI:  22.8- Normal weight  Estimated Nutritional Needs: Kcal: 1600-1800 Protein: 65-75 gram Fluid: 2200 ml/daily  Skin: WDL  Diet Order: NPO  EDUCATION NEEDS: -Education needs addressed   Intake/Output Summary (Last 24 hours) at 02/09/13 1256 Last data filed at 02/09/13 0946  Gross per 24 hour  Intake   1370 ml  Output     10 ml  Net   1360 ml    Last BM: 1/20   Labs:   Recent Labs Lab 02/08/13 1323  02/09/13 0558  NA 139 137  K 4.8 3.4*  CL 98 101  CO2 23 22  BUN 18 11  CREATININE 0.72 0.72  CALCIUM 9.7 8.8  GLUCOSE 131* 121*    CBG (last 3)  No results found for this basename: GLUCAP,  in the last 72 hours  Scheduled Meds: . botox 100 units/NS 4 mL for final conc. 25 units/mL mixture  100 Units Submucosal Once  . heparin  5,000 Units Subcutaneous Q8H  . pantoprazole (PROTONIX) IV  40 mg Intravenous Q24H    Continuous  Infusions: . sodium chloride Stopped (02/08/13 2045)  . sodium chloride 125 mL/hr at 02/08/13 2044  . sodium chloride      Past Medical History  Diagnosis Date  . Migraines   . Osteoporosis   . Seasonal allergies   . Sinusitis   . Back pain, chronic   . Nicotine addiction   . Constipation   . Vitamin D deficiency   . Gastritis   . Gastroparesis   . Pyloric spasm 03/30/2011  . Gastric outlet obstruction   . COPD (chronic obstructive pulmonary disease)   . Asthma   . Substance abuse 2008    marijuana  . Peptic ulcer disease   . Depression 2000    h/o suicidal ideation  . Anemia of other chronic disease 11/29/2012  . Chronic abdominal pain   . Nausea and vomiting     recurrent    Past Surgical History  Procedure Laterality Date  . Cholecystectomy      ?2002  . Laser surgery on cervix    . Carpal tunnel release      left hand  . Esophagogastroduodenoscopy  12/25/2010    Dorothyann Peng, MD;  moderate gastritis, ?goo secondary to pylorspasm. BX showed reactive gstropathy no h.pyori, SB mucosa with intramucosal lymphocytosis and partial villous blunting (TTG 4.0 normal)  . Esophageal dilation  12/25/2010    Procedure: ESOPHAGEAL DILATION;  Surgeon: Dorothyann Peng, MD;  Location: [facility];  Service: Endoscopy;;  . Esophagogastroduodenoscopy N/A 11/14/2012    FIE:PPIRJ hiatal hernia. Abnormal gastric mucosa of  uncertain significance-status post biopsy. Subjectively, patient may have recurrent symptomatic, pylorospasm.    Atlee Abide MS RD LDN Clinical Dietitian [phone]

## 2013-02-09 NOTE — Transfer of Care (Signed)
Immediate Anesthesia Transfer of Care Note  Patient: Tracey Daniel  Procedure(s) Performed: Procedure(s) with comments: ESOPHAGOGASTRODUODENOSCOPY (EGD) WITH PROPOFOL (N/A) BOTOX INJECTION (N/A) - possible balloon BALLOON DILATION (N/A)  Patient Location: PACU, ENDO  Anesthesia Type:MAC  Level of Consciousness: Patient easily awoken, sedated, comfortable, cooperative, following commands, responds to stimulation.   Airway & Oxygen Therapy: Patient spontaneously breathing, ventilating well, oxygen via simple oxygen mask.  Post-op Assessment: Report given to PACU RN, vital signs reviewed and stable, moving all extremities.   Post vital signs: Reviewed and stable.  Complications: No apparent anesthesia complications

## 2013-02-09 NOTE — Progress Notes (Signed)
[facility] PROGRESS NOTE  Tracey Daniel HQI:[id number] DOB: 05/06/1980 DOA: 02/08/2013 PCP: [name], MD  Assessment/Plan:  Principal Problem:   Intractable nausea and vomiting  - resolving. Continue supportive therapy  Active Problems:   Chronic obstructive asthma, unspecified - Stable continue prn duo nebs    Gastric outlet obstruction / Pyloric spasm - GI on board and patient is s/p recent upper EGD with botox injections to pylorus   Code Status: full Family Communication: Discussed with patient and family at bedside Disposition Plan: Per gastroenterologist   Consultants:  GI: Dr. Cristina Gong  Procedures:  Upper EGD  Antibiotics:  None  HPI/Subjective: Pt has no new complaints. No acute issues reported overnight.  Objective: Filed Vitals:   02/09/13 1541  BP: 148/95  Pulse: 79  Temp: 98.6 F (37 C)  Resp: 18    Intake/Output Summary (Last 24 hours) at 02/09/13 1805 Last data filed at 02/09/13 1710  Gross per 24 hour  Intake   2070 ml  Output     12 ml  Net   2058 ml   There were no vitals filed for this visit.  Exam:   General:  Pt in NAD, Alert and awake  Cardiovascular: RRR, no MRG  Respiratory: CTA BL, no wheezes  Abdomen: soft, ND  Musculoskeletal: no cyanosis or clubbing   Data Reviewed: Basic Metabolic Panel:  Recent Labs Lab 02/08/13 1323 02/09/13 0558  NA 139 137  K 4.8 3.4*  CL 98 101  CO2 23 22  GLUCOSE 131* 121*  BUN 18 11  CREATININE 0.72 0.72  CALCIUM 9.7 8.8   Liver Function Tests:  Recent Labs Lab 02/08/13 1323 02/09/13 0558  AST 36 29  ALT 16 14  ALKPHOS 81 75  BILITOT 0.3 0.3  PROT 8.2 7.2  ALBUMIN 4.6 3.9    Recent Labs Lab 02/08/13 1323  LIPASE 30   No results found for this basename: AMMONIA,  in the last 168 hours CBC:  Recent Labs Lab 02/08/13 1323 02/09/13 0558  WBC 13.7* 10.8*  NEUTROABS 11.1*  --   HGB 12.4 10.6*  HCT 37.1 32.0*  MCV 89.0 89.1  PLT 181 145*    Cardiac Enzymes: No results found for this basename: CKTOTAL, CKMB, CKMBINDEX, TROPONINI,  in the last 168 hours BNP (last 3 results) No results found for this basename: PROBNP,  in the last 8760 hours CBG: No results found for this basename: GLUCAP,  in the last 168 hours  No results found for this or any previous visit (from the past 240 hour(s)).   Studies: Dg Abd Acute W/chest  02/08/2013   CLINICAL DATA:  Abdominal pain for 2 days.  Nausea and vomiting.  EXAM: ACUTE ABDOMEN SERIES (ABDOMEN 2 VIEW & CHEST 1 VIEW)  COMPARISON:  CT chest 06/30/2011. Chest and two views abdomen 03/25/2011.  FINDINGS: Single view of the chest demonstrates clear lungs and normal heart size. No pneumothorax or pleural effusion.  Two views of the abdomen show no free intraperitoneal air. The bowel gas pattern is nonobstructive. Cholecystectomy clips are noted.  IMPRESSION: No acute finding chest or abdomen.   Electronically Signed   By: Inge Rise M.D.   On: 02/08/2013 14:13    Scheduled Meds: . botox 100 units/NS 4 mL for final conc. 25 units/mL mixture  100 Units Submucosal Once  . heparin  5,000 Units Subcutaneous Q8H  . pantoprazole (PROTONIX) IV  40 mg Intravenous Q24H   Continuous Infusions: . sodium chloride Stopped (02/08/13  2045)  . sodium chloride 125 mL/hr at 02/08/13 2044  . sodium chloride      Time spent: > 35 minutes    Velvet Bathe  [facility] Pager [phone]. If 7PM-7AM, please contact night-coverage at [email or web address], password [facility] 02/09/2013, 6:05 PM  LOS: 1 day

## 2013-02-09 NOTE — Progress Notes (Signed)
EGD done: See full report. I do not appreciate any pyloric stenosis. There was difficulty getting the scope to pass but this seemed to be because of an elongated, J-shaped stomach and eventually when the scope was passed with abdominal pressure it went through the pylorus easily. Based on previous history in response to same I injected botulinum toxin 25 units in each of 4 quadrants at the pylorus intramuscularly. After the procedure the scope wouldn't pass fairly easily into the duodenum. There was no bezoar or other residual material in the stomach. There was a very superficial mucosal lower esophageal ring. There were scattered patchy gastritis of the antrum otherwise study was normal. Will try full liquid diet and advance as tolerated.

## 2013-02-09 NOTE — Anesthesia Preprocedure Evaluation (Signed)
Anesthesia Evaluation  Patient identified by MRN, date of birth, ID band Patient awake    Reviewed: Allergy & Precautions, H&P , NPO status , Patient's Chart, lab work & pertinent test results  Airway Mallampati: II TM Distance: >3 FB Neck ROM: Full    Dental no notable dental hx.    Pulmonary asthma , Current Smoker,  breath sounds clear to auscultation  Pulmonary exam normal       Cardiovascular negative cardio ROS  Rhythm:Regular Rate:Normal     Neuro/Psych negative neurological ROS  negative psych ROS   GI/Hepatic negative GI ROS, Neg liver ROS,   Endo/Other  negative endocrine ROSHypothyroidism   Renal/GU negative Renal ROS  negative genitourinary   Musculoskeletal negative musculoskeletal ROS (+)   Abdominal   Peds negative pediatric ROS (+)  Hematology negative hematology ROS (+) anemia ,   Anesthesia Other Findings   Reproductive/Obstetrics negative OB ROS                           Anesthesia Physical Anesthesia Plan  ASA: II  Anesthesia Plan: MAC   Post-op Pain Management:    Induction: Intravenous  Airway Management Planned: Nasal Cannula  Additional Equipment:   Intra-op Plan:   Post-operative Plan:   Informed Consent: I have reviewed the patients History and Physical, chart, labs and discussed the procedure including the risks, benefits and alternatives for the proposed anesthesia with the patient or authorized representative who has indicated his/her understanding and acceptance.   Dental advisory given  Plan Discussed with: CRNA and Surgeon  Anesthesia Plan Comments:         Anesthesia Quick Evaluation

## 2013-02-09 NOTE — Consult Note (Signed)
Referring Provider: Dr. Marylu Lund (Hospitalist) Primary Care Physician:  [name], MD Primary Gastroenterologist:  Dr. Buford Dresser [facility])  Reason for Consultation:  Nausea and vomiting  HPI: Tracey Daniel is a 33 y.o. female admitted through the emergency room last night with a several day history of severe nausea and vomiting.  The patient has a past history of pyloric stenosis and/or pylorospasm, causing problems with recurring nausea and vomiting for the past several years. In December 2012, she underwent endoscopy for these symptoms by Dr. Lysle Rubens in [place], at which time there was apparent pyloric stenosis and the gastroscope could not cross the pylorus. A balloon dilatation to 12 mm was performed and the pediatric colonoscope was used and was able to enter into the duodenum. The patient was then referred to [facility] where she was seen by Dr. Milderd Meager, and she underwent, by report, endoscopy with Botox injection of the pylorus and pyloric channel dilatation at that time.  The patient indicates that since that procedure, she is substantially improved, but still has some residual symptoms. Specifically, every couple of months, she will have several days of vomiting once or twice a day. At other times, she is not able to really eat normally but we she is not vomiting. She has to sort of adherent to a gastroparesis-type diet. She has never had any subsequent Botox injections.  The patient does use Vicodin for back pain, but not on a continuous basis. She estimates she uses a roughly 50% of days, typically one or 2 doses a day.  There is also problem with significant constipation, although that has gotten better recently, for unclear reasons. In the past, the patient might go a week or 2, or by her report even a month, without a bowel movement. She was therefore treated for a period of time with MiraLAX, but she no longer needs to use that can in fact  she reports having several bowel movements a week now, without any need for a laxative.  The patient reports that food is slow to go down her esophagus this happens fairly frequently. However, it does not sound as though there has been any frank food impactions and no mention of an esophageal stricture was made during her endoscopy by Dr. Buford Dresser last October.   Past Medical History  Diagnosis Date  . Migraines   . Osteoporosis   . Seasonal allergies   . Sinusitis   . Back pain, chronic   . Nicotine addiction   . Constipation   . Vitamin D deficiency   . Gastritis   . Gastroparesis   . Pyloric spasm 03/30/2011  . Gastric outlet obstruction   . COPD (chronic obstructive pulmonary disease)   . Asthma   . Substance abuse 2008    marijuana  . Peptic ulcer disease   . Depression 2000    h/o suicidal ideation  . Anemia of other chronic disease 11/29/2012  . Chronic abdominal pain   . Nausea and vomiting     recurrent    Past Surgical History  Procedure Laterality Date  . Cholecystectomy      ?2002  . Laser surgery on cervix    . Carpal tunnel release      left hand  . Esophagogastroduodenoscopy  12/25/2010    Dorothyann Peng, MD;  moderate gastritis, ?goo secondary to pylorspasm. BX showed reactive gstropathy no h.pyori, SB mucosa with intramucosal lymphocytosis and partial villous blunting (TTG 4.0 normal)  . Esophageal dilation  12/25/2010    Procedure: ESOPHAGEAL DILATION;  Surgeon: Dorothyann Peng, MD;  Location: [facility] SUITE;  Service: Endoscopy;;  . Esophagogastroduodenoscopy N/A 11/14/2012    YOV:ZCHYI hiatal hernia. Abnormal gastric mucosa of  uncertain significance-status post biopsy. Subjectively, patient may have recurrent symptomatic, pylorospasm.    Prior to Admission medications   Medication Sig Start Date End Date Taking? Authorizing Provider  albuterol (PROVENTIL HFA;VENTOLIN HFA) 108 (90 BASE) MCG/ACT inhaler Inhale 2 puffs into the lungs every 6 (six) hours as  needed. Shortness of breath 11/14/12 05/27/15  [name], MD  budesonide-formoterol [facility]) 80-4.5 MCG/ACT inhaler Inhale 2 puffs into the lungs 2 (two) times daily. 11/14/12 05/27/15  [name], MD  calcium-vitamin D (OSCAL 500/200 D-3) 500-200 MG-UNIT per tablet Take 1 tablet by mouth 2 (two) times daily. 11/14/12 11/14/13  [name], MD  Cholecalciferol (VITAMIN D3) 2000 UNITS CHEW Chew by mouth.    Historical Provider, MD  clobetasol cream (TEMOVATE) 5.02 % Apply 1 application topically 2 (two) times daily. Apply to rash    Historical Provider, MD  cyclobenzaprine (FLEXERIL) 10 MG tablet Take 10 mg by mouth 3 (three) times daily as needed. For muscle spasms    Historical Provider, MD  dexlansoprazole (DEXILANT) 60 MG capsule Take 1 capsule (60 mg total) by mouth daily before breakfast. 12/19/12   Mahala Menghini, PA-C  DULoxetine (CYMBALTA) 30 MG capsule Take 1 capsule (30 mg total) by mouth daily. 03/02/12 09/12/14  [name], MD  ergocalciferol (VITAMIN D2) 50000 UNITS capsule Take 1 capsule (50,000 Units total) by mouth once a week. One capsule once weekly 11/14/12   [name], MD  estradiol (ESTRACE VAGINAL) 0.1 MG/GM vaginal cream Place 2 g vaginally daily. As directed    Historical Provider, MD  feeding supplement (ENSURE CLINICAL STRENGTH) LIQD Take 237 mLs by mouth 3 (three) times daily with meals. 01/01/11   Nishant Dhungel, MD  ferrous sulfate 325 (65 FE) MG tablet Take 325 mg by mouth daily with breakfast.    Historical Provider, MD  fluticasone (CUTIVATE) 0.05 % cream Apply 1 application topically 2 (two) times daily.    Historical Provider, MD  fluticasone (FLONASE) 50 MCG/ACT nasal spray Place 2 sprays into both nostrils daily.    Historical Provider, MD  HYDROcodone-acetaminophen (NORCO) 7.5-325 MG per tablet Take 1 tablet by mouth every 4 (four) hours as needed. For pain    Historical Provider, MD  Linaclotide (LINZESS) 145 MCG CAPS capsule  Take 1 capsule (145 mcg total) by mouth daily. 12/19/12   Mahala Menghini, PA-C  loratadine (CLARITIN) 10 MG tablet Take 1 tablet (10 mg total) by mouth daily. 10/09/11   [name], MD  Multiple Vitamin (MULTIVITAMIN WITH MINERALS) TABS tablet Take 1 tablet by mouth daily.    Historical Provider, MD  promethazine (PHENERGAN) 25 MG tablet Take 1 tablet (25 mg total) by mouth every 6 (six) hours as needed for nausea. 11/09/12   Mahala Menghini, PA-C  simvastatin (ZOCOR) 20 MG tablet Take 20 mg by mouth daily.    Historical Provider, MD  sucralfate (CARAFATE) 1 GM/10ML suspension Take 10 mLs (1 g total) by mouth 4 (four) times daily -  with meals and at bedtime. 11/30/12   Baird Cancer, PA-C    Current Facility-Administered Medications  Medication Dose Route Frequency Provider Last Rate Last Dose  . 0.9 %  sodium chloride infusion   Intravenous Continuous Donne Hazel, MD      .  0.9 %  sodium chloride infusion   Intravenous Continuous Donne Hazel, MD 125 mL/hr at 02/08/13 2044    . acetaminophen (TYLENOL) tablet 650 mg  650 mg Oral Q6H PRN Donne Hazel, MD       Or  . acetaminophen (TYLENOL) suppository 650 mg  650 mg Rectal Q6H PRN Donne Hazel, MD      . heparin injection 5,000 Units  5,000 Units Subcutaneous Q8H Donne Hazel, MD   5,000 Units at 02/09/13 0542  . ipratropium-albuterol (DUONEB) 0.5-2.5 (3) MG/3ML nebulizer solution 3 mL  3 mL Nebulization Q4H PRN Donne Hazel, MD      . morphine 2 MG/ML injection 2 mg  2 mg Intravenous Q4H PRN Donne Hazel, MD   2 mg at 02/09/13 0541  . ondansetron (ZOFRAN) tablet 4 mg  4 mg Oral Q6H PRN Donne Hazel, MD       Or  . ondansetron [facility]) injection 4 mg  4 mg Intravenous Q6H PRN Donne Hazel, MD   4 mg at 02/09/13 0542  . pantoprazole (PROTONIX) injection 40 mg  40 mg Intravenous Q24H Donne Hazel, MD   40 mg at 02/08/13 2241  . promethazine (PHENERGAN) injection 12.5 mg  12.5 mg Intravenous Q6H PRN Ritta Slot, NP    12.5 mg at 02/09/13 0012    Allergies as of 02/08/2013 - Review Complete 02/08/2013  Allergen Reaction Noted  . Latex Itching and Other (See Comments) 11/30/2011  . Penicillins      Family History  Problem Relation Age of Onset  . Diabetes Father   . Liver disease Father     liver transplant at [facility], age 54  . Colon cancer Neg Hx   . Lung cancer Mother     History   Social History  . Marital Status: Single    Spouse Name: N/A    Number of Children: 0  . Years of Education: N/A   Occupational History  . disability    Social History Main Topics  . Smoking status: Current Every Day Smoker -- 0.50 packs/day for 15 years    Types: Cigarettes  . Smokeless tobacco: Not on file     Comment: quit 2011  . Alcohol Use: No     Comment: none since July. Periodic heavy drinker for months at a time.  . Drug Use: Yes    Special: Marijuana  . Sexual Activity: Yes    Birth Control/ Protection: None   Other Topics Concern  . Not on file   Social History Narrative  . No narrative on file    Review of Systems: The patient reports some probable mild weight loss,  Perhaps 5 pounds, over the past month. I don't think she has any significant reflux symptomatology. She does get some epigastric pain in association with her vomiting. No problem with hematochezia. Please see history of present illness for further details.   Physical Exam: Vital signs in last 24 hours: Temp:  [97 F (36.1 C)-99.4 F (37.4 C)] 99.4 F (37.4 C) (01/22 0538) Pulse Rate:  [50-89] 77 (01/22 0538) Resp:  [16-20] 20 (01/22 0538) BP: (91-144)/(45-82) 121/67 mmHg (01/22 0538) SpO2:  [98 %-100 %] 100 % (01/22 0538) Last BM Date: 02/07/13 General:   Alert,  Well-developed, well-nourished, pleasant and cooperative in NAD Head:  Normocephalic and atraumatic. Eyes:  Sclera clear, no icterus.   Conjunctiva pink. Mouth:   No ulcerations or lesions.  Oropharynx pink &  moist. Neck:   No masses or  thyromegaly. Lungs:  Clear throughout to auscultation.   No wheezes, crackles, or rhonchi. No evident respiratory distress. Heart:   Regular rate and rhythm; no murmurs, clicks, rubs,  or gallops. Abdomen:  Soft, nontender, nontympanitic, and nondistended. No masses, hepatosplenomegaly or ventral hernias noted. Quiet bowel sounds, without bruits, guarding, or rebound.   Msk:   Symmetrical without gross deformities. Extremities:   Without clubbing, cyanosis, or edema. Neurologic:  Alert and coherent;  grossly normal neurologically. Skin:  Intact without significant lesions or rashes. Cervical Nodes:  No significant cervical adenopathy. Psych:   Alert and cooperative. Normal mood and affect.  Intake/Output from previous day: 01/21 0701 - 01/22 0700 In: 1370 [I.V.:1370] Out: 9 [Urine:4; Emesis/NG output:5] Intake/Output this shift: Total I/O In: -  Out: 1 [Urine:1]  Lab Results:  Recent Labs  02/08/13 1323 02/09/13 0558  WBC 13.7* 10.8*  HGB 12.4 10.6*  HCT 37.1 32.0*  PLT 181 145*   BMET  Recent Labs  02/08/13 1323 02/09/13 0558  NA 139 137  K 4.8 3.4*  CL 98 101  CO2 23 22  GLUCOSE 131* 121*  BUN 18 11  CREATININE 0.72 0.72  CALCIUM 9.7 8.8   LFT  Recent Labs  02/09/13 0558  PROT 7.2  ALBUMIN 3.9  AST 29  ALT 14  ALKPHOS 75  BILITOT 0.3   PT/INR No results found for this basename: LABPROT, INR,  in the last 72 hours   Studies/Results: Dg Abd Acute W/chest  02/08/2013   CLINICAL DATA:  Abdominal pain for 2 days.  Nausea and vomiting.  EXAM: ACUTE ABDOMEN SERIES (ABDOMEN 2 VIEW & CHEST 1 VIEW)  COMPARISON:  CT chest 06/30/2011. Chest and two views abdomen 03/25/2011.  FINDINGS: Single view of the chest demonstrates clear lungs and normal heart size. No pneumothorax or pleural effusion.  Two views of the abdomen show no free intraperitoneal air. The bowel gas pattern is nonobstructive. Cholecystectomy clips are noted.  IMPRESSION: No acute finding chest or  abdomen.   Electronically Signed   By: Inge Rise M.D.   On: 02/08/2013 14:13    Impression: 1. Nausea and vomiting, in a pattern consistent with her previous exacerbation of pyloric spasm and/or pyloric channel stenosis. 2. Chronic constipation, spontaneously improved  Plan: Endoscopic evaluation today with possible Botox injection of the pylorus and/or pyloric channel dilatation, and perhaps esophageal dilatation. The nature, purpose, and risks of these procedures have been discussed with the patient and she is agreeable to proceed. She is aware that the procedure will be done by my partner, Dr. Teena Irani.   LOS: 1 day   Ladainian Therien V  02/09/2013, 10:47 AM

## 2013-02-09 NOTE — Op Note (Signed)
[facility] [place], [address]   ENDOSCOPY PROCEDURE REPORT  PATIENT: Tracey Daniel, Tracey Daniel  MR#: [id number] BIRTHDATE: November 17, 1980 , 32  yrs. old GENDER: Female ENDOSCOPIST:Bethel Sirois Amedeo Plenty, MD REFERRED BY: PROCEDURE DATE:  02/09/2013 PROCEDURE: ASA CLASS: INDICATIONS:  gastroparesis with history of pyloric Botox injection MEDICATION:    as per anesthesia TOPICAL ANESTHETIC:   Cetacaine spray  DESCRIPTION OF PROCEDURE:   esophagus: There was a very thin partial lower esophageal ring that was quite patent. I did not perform dilatation of it. Stomach scattered antral gastritis no retained food in the stomach or other contents. Pylorus appeared normal. Initial difficulty crossing it felt to be due to an elongated stomach, eventually scope passed without difficulty no appreciation of any pyloric stenosis. Botox injected in 4 aliquots 25 units per quadrant intramuscularly.  Duodenum: Normal     COMPLICATIONS: None  ENDOSCOPIC IMPRESSION:elongated stomach, partial lower esophageal ring widely patent. No obvious pyloric stenosis, status post Botox injection as planned  RECOMMENDATIONS:resume diet and observe response to today's intervention    _______________________________ Lorrin MaisTeena Irani, MD 02/09/2013 2:01 PM

## 2013-02-09 NOTE — Preoperative (Signed)
Beta Blockers   Reason not to administer Beta Blockers:Not Applicable, not on home BB 

## 2013-02-10 ENCOUNTER — Encounter (HOSPITAL_COMMUNITY): Payer: Self-pay | Admitting: Gastroenterology

## 2013-02-10 DIAGNOSIS — R109 Unspecified abdominal pain: Secondary | ICD-10-CM | POA: Diagnosis not present

## 2013-02-10 DIAGNOSIS — K219 Gastro-esophageal reflux disease without esophagitis: Secondary | ICD-10-CM

## 2013-02-10 DIAGNOSIS — K3184 Gastroparesis: Secondary | ICD-10-CM | POA: Diagnosis not present

## 2013-02-10 DIAGNOSIS — K3189 Other diseases of stomach and duodenum: Secondary | ICD-10-CM | POA: Diagnosis not present

## 2013-02-10 DIAGNOSIS — R1013 Epigastric pain: Secondary | ICD-10-CM | POA: Diagnosis not present

## 2013-02-10 DIAGNOSIS — K311 Adult hypertrophic pyloric stenosis: Secondary | ICD-10-CM | POA: Diagnosis not present

## 2013-02-10 DIAGNOSIS — G8929 Other chronic pain: Secondary | ICD-10-CM

## 2013-02-10 DIAGNOSIS — R1115 Cyclical vomiting syndrome unrelated to migraine: Secondary | ICD-10-CM | POA: Diagnosis not present

## 2013-02-10 LAB — BASIC METABOLIC PANEL
BUN: 13 mg/dL (ref 6–23)
CHLORIDE: 105 meq/L (ref 96–112)
CO2: 25 meq/L (ref 19–32)
CREATININE: 0.82 mg/dL (ref 0.50–1.10)
Calcium: 8.7 mg/dL (ref 8.4–10.5)
GFR calc Af Amer: >90 mL/min (ref >90)
GFR calc non Af Amer: >90 mL/min (ref >90)
Glucose, Bld: 87 mg/dL (ref 70–99)
Potassium: 3.7 mEq/L (ref 3.7–5.3)
Sodium: 141 mEq/L (ref 137–147)

## 2013-02-10 LAB — CBC
HEMATOCRIT: 31.9 % — AB (ref 36.0–46.0)
Hemoglobin: 10.4 g/dL — ABNORMAL LOW (ref 12.0–15.0)
MCH: 29.5 pg (ref 26.0–34.0)
MCHC: 32.6 g/dL (ref 30.0–36.0)
MCV: 90.6 fL (ref 78.0–100.0)
Platelets: 131 10*3/uL — ABNORMAL LOW (ref 150–400)
RBC: 3.52 MIL/uL — ABNORMAL LOW (ref 3.87–5.11)
RDW: 13.8 % (ref 11.5–15.5)
WBC: 10 10*3/uL (ref 4.0–10.5)

## 2013-02-10 MED ORDER — ONDANSETRON 4 MG PO TBDP: 4.0000 mg | ORAL_TABLET | Freq: Three times a day (TID) | ORAL | Status: DC | PRN

## 2013-02-10 MED ORDER — ENSURE COMPLETE PO LIQD: 237.0000 mL | Freq: Three times a day (TID) | ORAL | Status: DC

## 2013-02-10 MED ORDER — HYDROCODONE-ACETAMINOPHEN 7.5-325 MG PO TABS: 1.0000 | ORAL_TABLET | ORAL | Status: DC | PRN

## 2013-02-10 MED ORDER — OXYCODONE HCL 5 MG PO TABS
5.0000 mg | ORAL_TABLET | Freq: Four times a day (QID) | ORAL | Status: DC | PRN
  Administered 2013-02-10: 5 mg via ORAL
  Filled 2013-02-10: qty 1

## 2013-02-10 NOTE — Progress Notes (Signed)
[facility] Progress Note  Subjective: Urine she ate breakfast today consisting of eggs blueberry muffin and some sausage.  Objective: Vital signs in last 24 hours: Temp:  [98 F (36.7 C)-98.6 F (37 C)] 98 F (36.7 C) (01/23 0600) Pulse Rate:  [55-79] 55 (01/23 0600) Resp:  [14-18] 16 (01/23 0600) BP: (108-164)/(58-95) 118/69 mmHg (01/23 0600) SpO2:  [99 %-100 %] 100 % (01/23 0600) Weight:  [68.856 kg (151 lb 12.8 oz)] 68.856 kg (151 lb 12.8 oz) (01/22 2030) Weight change:    PE: Unchanged  Lab Results: Results for orders placed during the hospital encounter of 02/08/13 (from the past 24 hour(s))  BASIC METABOLIC PANEL     Status: None   Collection Time    02/10/13  6:00 AM      Result Value Range   Sodium 141  137 - 147 mEq/L   Potassium 3.7  3.7 - 5.3 mEq/L   Chloride 105  96 - 112 mEq/L   CO2 25  19 - 32 mEq/L   Glucose, Bld 87  70 - 99 mg/dL   BUN 13  6 - 23 mg/dL   Creatinine, Ser 0.82  0.50 - 1.10 mg/dL   Calcium 8.7  8.4 - 10.5 mg/dL   GFR calc non Af Amer >90  >90 mL/min   GFR calc Af Amer >90  >90 mL/min  CBC     Status: Abnormal   Collection Time    02/10/13  6:00 AM      Result Value Range   WBC 10.0  4.0 - 10.5 K/uL   RBC 3.52 (*) 3.87 - 5.11 MIL/uL   Hemoglobin 10.4 (*) 12.0 - 15.0 g/dL   HCT 31.9 (*) 36.0 - 46.0 %   MCV 90.6  78.0 - 100.0 fL   MCH 29.5  26.0 - 34.0 pg   MCHC 32.6  30.0 - 36.0 g/dL   RDW 13.8  11.5 - 15.5 %   Platelets 131 (*) 150 - 400 K/uL    Studies/Results: Dg Abd Acute W/chest  02/08/2013   CLINICAL DATA:  Abdominal pain for 2 days.  Nausea and vomiting.  EXAM: ACUTE ABDOMEN SERIES (ABDOMEN 2 VIEW & CHEST 1 VIEW)  COMPARISON:  CT chest 06/30/2011. Chest and two views abdomen 03/25/2011.  FINDINGS: Single view of the chest demonstrates clear lungs and normal heart size. No pneumothorax or pleural effusion.  Two views of the abdomen show no free intraperitoneal air. The bowel gas pattern is nonobstructive.  Cholecystectomy clips are noted.  IMPRESSION: No acute finding chest or abdomen.   Electronically Signed   By: Inge Rise M.D.   On: 02/08/2013 14:13      Assessment: Presumed pylorospasm causing gastroparesis status post Botox injection of the pylorus with initially good results.  Plan: Continue to monitor her diet and response in terms of her improvement and vomiting. Hopefully discharge in a day or so. Call us if further GI input needed.    WUJWJ,XBJY C 02/10/2013, 9:27 AM

## 2013-02-10 NOTE — Progress Notes (Signed)
DC instructions carefully reviewed with patient.  IV Dc'ed.  No changes noted since am assessment.  Rx given.  Patient to see GI doctor in 2-3 weeks.  Patient denies questions or concerns at this time.

## 2013-02-10 NOTE — Progress Notes (Signed)
(  no charge)  Gastroparesis diet discussed with patient.  Dr. Teena Irani has already seen the patient today for our service.  Cleotis Nipper, M.D. [phone]

## 2013-02-10 NOTE — Discharge Summary (Signed)
Physician Discharge Summary  Tracey Daniel Marcy JEH:[id number] DOB: [birth year] DOA: 02/08/2013  PCP: [name], MD  Admit date: 02/08/2013 Discharge date: 02/10/2013  Time spent: > 35 minutes  Recommendations for Outpatient Follow-up:  1. Pt to follow up with Dr. Amedeo Plenty, Jenny Reichmann in 1-2 weeks post discharge.   Discharge Diagnoses:  Principal Problem:   Intractable nausea and vomiting Active Problems:   Chronic obstructive asthma, unspecified   Gastric outlet obstruction   Pyloric spasm   Discharge Condition: stable  Diet recommendation: Low fiber  Filed Weights   02/09/13 2030  Weight: 68.856 kg (151 lb 12.8 oz)    History of present illness:  Patient is a 33 year old African American female with history of chronic pain, history of recurrent abdominal pain, gastroparesis, pyloric spasm requiring Botox injections in the past. Who presented to the ED complaining of nausea and vomiting.  Hospital Course:   Principal Problem:  Intractable nausea and vomiting  - Resolved after intervention from gastroenterology (please see below) - Will discharge with Zofran ODT and recommend discontinuing Phenergan once patient transitions home.  Active Problems:  Chronic obstructive asthma, unspecified  - Stable continue prn duo nebs   Gastric outlet obstruction / Pyloric spasm  - GI on board and patient is s/p recent upper EGD 02/09/2013 with botox injections to pylorus - Patient feeling better but reports some abdominal discomfort.  Procedures:  As mentioned above  Consultations:  Gastroenterology: Dr. Amedeo Plenty, Jenny Reichmann  Discharge Exam: Filed Vitals:   02/10/13 1332  BP: 139/89  Pulse: 62  Temp: 98.4 F (36.9 C)  Resp: 18    General: Patient in no acute distress, alert and awake Cardiovascular: Regular rate and rhythm, no murmurs rubs or gallops Respiratory: Clear to auscultation, no rales, no wheezes, no increased work of breathing  Discharge Instructions  Discharge  Orders   Future Appointments Provider Department Dept Phone   02/14/2013 8:30 AM [name], MD [place] [phone]   12/01/2013 9:30 AM [place] [phone]   12/05/2013 10:00 AM [facility] Provider [place] [phone]   Future Orders Complete By Expires   Call MD for:  persistant nausea and vomiting  As directed    Call MD for:  redness, tenderness, or signs of infection (pain, swelling, redness, odor or green/yellow discharge around incision site)  As directed    Call MD for:  temperature >100.4  As directed    Diet - low sodium heart healthy  As directed    Discharge instructions  As directed    Comments:     Please f/u with your GI specialist in 1-2 weeks or sooner should any new concerns arise.   Increase activity slowly  As directed        Medication List    STOP taking these medications       promethazine 25 MG tablet  Commonly known as:  PHENERGAN      TAKE these medications       albuterol 108 (90 BASE) MCG/ACT inhaler  Commonly known as:  PROVENTIL HFA;VENTOLIN HFA  Inhale 2 puffs into the lungs every 6 (six) hours as needed. Shortness of breath     budesonide-formoterol 80-4.5 MCG/ACT inhaler  Commonly known as:  SYMBICORT  Inhale 2 puffs into the lungs 2 (two) times daily.     calcium-vitamin D 500-200 MG-UNIT per tablet  Commonly known as:  OSCAL 500/200 D-3  Take 1 tablet by mouth 2 (two) times daily.  clobetasol cream 0.05 %  Commonly known as:  TEMOVATE  Apply 1 application topically 2 (two) times daily. Apply to rash     cyclobenzaprine 10 MG tablet  Commonly known as:  FLEXERIL  Take 10 mg by mouth 3 (three) times daily as needed. For muscle spasms     dexlansoprazole 60 MG capsule  Commonly known as:  DEXILANT  Take 1 capsule (60 mg total) by mouth daily before breakfast.     DULoxetine 30 MG capsule  Commonly known as:  CYMBALTA  Take 1 capsule (30 mg total) by  mouth daily.     ergocalciferol 50000 UNITS capsule  Commonly known as:  VITAMIN D2  Take 1 capsule (50,000 Units total) by mouth once a week. One capsule once weekly     ESTRACE VAGINAL 0.1 MG/GM vaginal cream  Generic drug:  estradiol  Place 2 g vaginally daily. As directed     feeding supplement Liqd  Take 237 mLs by mouth 3 (three) times daily with meals.     ferrous sulfate 325 (65 FE) MG tablet  Take 325 mg by mouth daily with breakfast.     fluticasone 0.05 % cream  Commonly known as:  CUTIVATE  Apply 1 application topically 2 (two) times daily.     fluticasone 50 MCG/ACT nasal spray  Commonly known as:  FLONASE  Place 2 sprays into both nostrils daily.     HYDROcodone-acetaminophen 7.5-325 MG per tablet  Commonly known as:  NORCO  Take 1 tablet by mouth every 4 (four) hours as needed for moderate pain. For pain     Linaclotide 145 MCG Caps capsule  Commonly known as:  LINZESS  Take 1 capsule (145 mcg total) by mouth daily.     loratadine 10 MG tablet  Commonly known as:  CLARITIN  Take 1 tablet (10 mg total) by mouth daily.     multivitamin with minerals Tabs tablet  Take 1 tablet by mouth daily.     ondansetron 4 MG disintegrating tablet  Commonly known as:  ZOFRAN ODT  Take 1 tablet (4 mg total) by mouth every 8 (eight) hours as needed for nausea or vomiting.     simvastatin 20 MG tablet  Commonly known as:  ZOCOR  Take 20 mg by mouth daily.     sucralfate 1 GM/10ML suspension  Commonly known as:  CARAFATE  Take 10 mLs (1 g total) by mouth 4 (four) times daily -  with meals and at bedtime.     Vitamin D3 2000 UNITS Chew  Chew by mouth.       Allergies  Allergen Reactions  . Latex Itching and Other (See Comments)    "burning" where the tape goes  . Penicillins     unknown      The results of significant diagnostics from this hospitalization (including imaging, microbiology, ancillary and laboratory) are listed below for reference.     Significant Diagnostic Studies: Dg Abd Acute W/chest  02/08/2013   CLINICAL DATA:  Abdominal pain for 2 days.  Nausea and vomiting.  EXAM: ACUTE ABDOMEN SERIES (ABDOMEN 2 VIEW & CHEST 1 VIEW)  COMPARISON:  CT chest 06/30/2011. Chest and two views abdomen 03/25/2011.  FINDINGS: Single view of the chest demonstrates clear lungs and normal heart size. No pneumothorax or pleural effusion.  Two views of the abdomen show no free intraperitoneal air. The bowel gas pattern is nonobstructive. Cholecystectomy clips are noted.  IMPRESSION: No acute finding chest or abdomen.  Electronically Signed   By: Inge Rise M.D.   On: 02/08/2013 14:13    Microbiology: No results found for this or any previous visit (from the past 240 hour(s)).   Labs: Basic Metabolic Panel:  Recent Labs Lab 02/08/13 1323 02/09/13 0558 02/10/13 0600  NA 139 137 141  K 4.8 3.4* 3.7  CL 98 101 105  CO2 23 22 25   GLUCOSE 131* 121* 87  BUN 18 11 13   CREATININE 0.72 0.72 0.82  CALCIUM 9.7 8.8 8.7   Liver Function Tests:  Recent Labs Lab 02/08/13 1323 02/09/13 0558  AST 36 29  ALT 16 14  ALKPHOS 81 75  BILITOT 0.3 0.3  PROT 8.2 7.2  ALBUMIN 4.6 3.9    Recent Labs Lab 02/08/13 1323  LIPASE 30   No results found for this basename: AMMONIA,  in the last 168 hours CBC:  Recent Labs Lab 02/08/13 1323 02/09/13 0558 02/10/13 0600  WBC 13.7* 10.8* 10.0  NEUTROABS 11.1*  --   --   HGB 12.4 10.6* 10.4*  HCT 37.1 32.0* 31.9*  MCV 89.0 89.1 90.6  PLT 181 145* 131*   Cardiac Enzymes: No results found for this basename: CKTOTAL, CKMB, CKMBINDEX, TROPONINI,  in the last 168 hours BNP: BNP (last 3 results) No results found for this basename: PROBNP,  in the last 8760 hours CBG: No results found for this basename: GLUCAP,  in the last 168 hours     Signed:  Velvet Bathe  [facility] 02/10/2013, 4:15 PM

## 2013-02-14 ENCOUNTER — Telehealth: Payer: Self-pay

## 2013-02-14 ENCOUNTER — Ambulatory Visit: Payer: Medicare Other | Admitting: Family Medicine

## 2013-02-14 NOTE — Telephone Encounter (Signed)
Pt called because she needs a referral. I did not understand what she was talking about. She stated that she was having some vagina bleeding. I told her to call her PCP to get a referral to go see a GYN because she has [place] [facility].

## 2013-02-15 NOTE — Telephone Encounter (Signed)
noted 

## 2013-02-16 ENCOUNTER — Ambulatory Visit (INDEPENDENT_AMBULATORY_CARE_PROVIDER_SITE_OTHER): Payer: Medicare Other | Admitting: Advanced Practice Midwife

## 2013-02-16 ENCOUNTER — Encounter: Payer: Self-pay | Admitting: Advanced Practice Midwife

## 2013-02-16 VITALS — BP 118/70 | Ht 66.0 in | Wt 137.5 lb

## 2013-02-16 DIAGNOSIS — R102 Pelvic and perineal pain: Secondary | ICD-10-CM

## 2013-02-16 DIAGNOSIS — Z202 Contact with and (suspected) exposure to infections with a predominantly sexual mode of transmission: Secondary | ICD-10-CM | POA: Diagnosis not present

## 2013-02-16 DIAGNOSIS — N926 Irregular menstruation, unspecified: Secondary | ICD-10-CM

## 2013-02-16 DIAGNOSIS — N912 Amenorrhea, unspecified: Secondary | ICD-10-CM

## 2013-02-16 DIAGNOSIS — A599 Trichomoniasis, unspecified: Secondary | ICD-10-CM | POA: Diagnosis not present

## 2013-02-16 MED ORDER — METRONIDAZOLE 500 MG PO TABS: 2000.0000 mg | ORAL_TABLET | Freq: Once | ORAL | Status: DC

## 2013-02-16 NOTE — Progress Notes (Signed)
Tracey Daniel 33 y.o.  Filed Vitals:   02/16/13 1353  BP: 118/70   Past Medical History  Diagnosis Date  . Migraines   . Osteoporosis   . Seasonal allergies   . Sinusitis   . Back pain, chronic   . Nicotine addiction   . Constipation   . Vitamin D deficiency   . Gastritis   . Gastroparesis   . Pyloric spasm 03/30/2011  . Gastric outlet obstruction   . COPD (chronic obstructive pulmonary disease)   . Asthma   . Substance abuse 2008    marijuana  . Peptic ulcer disease   . Depression 2000    h/o suicidal ideation  . Anemia of other chronic disease 11/29/2012  . Chronic abdominal pain   . Nausea and vomiting     recurrent   Past Surgical History  Procedure Laterality Date  . Cholecystectomy      ?2002  . Laser surgery on cervix    . Carpal tunnel release      left hand  . Esophagogastroduodenoscopy  12/25/2010    Dorothyann Peng, MD;  moderate gastritis, ?goo secondary to pylorspasm. BX showed reactive gstropathy no h.pyori, SB mucosa with intramucosal lymphocytosis and partial villous blunting (TTG 4.0 normal)  . Esophageal dilation  12/25/2010    Procedure: ESOPHAGEAL DILATION;  Surgeon: Dorothyann Peng, MD;  [facility];  Service: Endoscopy;;  . Esophagogastroduodenoscopy N/A 11/14/2012    HUT:MLYYT hiatal hernia. Abnormal gastric mucosa of  uncertain significance-status post biopsy. Subjectively, patient may have recurrent symptomatic, pylorospasm.  . Esophagogastroduodenoscopy (egd) with propofol N/A 02/09/2013    Procedure: ESOPHAGOGASTRODUODENOSCOPY (EGD) WITH PROPOFOL;  Surgeon: Missy Sabins, MD;  [facility];  Service: Endoscopy;  Laterality: N/A;  . Botox injection N/A 02/09/2013    Procedure: BOTOX INJECTION;  Surgeon: Missy Sabins, MD;  [facility];  Service: Endoscopy;  Laterality: N/A;  possible balloon  . Balloon dilation N/A 02/09/2013    Procedure: BALLOON DILATION;  Surgeon: Missy Sabins, MD;  [facility];   Service: Endoscopy;  Laterality: N/A;   family history includes Diabetes in her father; Heart disease in her maternal grandmother; Liver disease in her father; Lung cancer in her mother; Parkinson's disease in her maternal grandfather. There is no history of Colon cancer. [name] does not currently have medications on file.  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  Started noticing vaginal bleeding, mild, on Tuesday.  Also c/o pelvic pain.  PE:  SSE:  Yellow frothy discharge.  No blood.  Wet prep + trich TNTC WBC's.  ASSESSMENT:  Trichamoniasis  PLAN:   Orders Placed This Encounter  Procedures  . GC/Chlamydia Probe Amp   Flagyl 2gm PO for pt and partner.  POC 3 weeks

## 2013-02-16 NOTE — Patient Instructions (Signed)
Trichomoniasis °Trichomoniasis is an infection, caused by the Trichomonas organism, that affects both women and men. In women, the outer female genitalia and the vagina are affected. In men, the penis is mainly affected, but the prostate and other reproductive organs can also be involved. Trichomoniasis is a sexually transmitted disease (STD) and is most often passed to another person through sexual contact. The majority of people who get trichomoniasis do so from a sexual encounter and are also at risk for other STDs. °CAUSES  °· Sexual intercourse with an infected partner. °· It can be present in swimming pools or hot tubs. °SYMPTOMS  °· Abnormal gray-green frothy vaginal discharge in women. °· Vaginal itching and irritation in women. °· Itching and irritation of the area outside the vagina in women. °· Penile discharge with or without pain in males. °· Inflammation of the urethra (urethritis), causing painful urination. °· Bleeding after sexual intercourse. °RELATED COMPLICATIONS °· Pelvic inflammatory disease. °· Infection of the uterus (endometritis). °· Infertility. °· Tubal (ectopic) pregnancy. °· It can be associated with other STDs, including gonorrhea and chlamydia, hepatitis B, and HIV. °COMPLICATIONS DURING PREGNANCY °· Early (premature) delivery. °· Premature rupture of the membranes (PROM). °· Low birth weight. °DIAGNOSIS  °· Visualization of Trichomonas under the microscope from the vagina discharge. °· Ph of the vagina greater than 4.5, tested with a test tape. °· Trich Rapid Test. °· Culture of the organism, but this is not usually needed. °· It may be found on a Pap test. °· Having a "strawberry cervix,"which means the cervix looks very red like a strawberry. °TREATMENT  °· You may be given medication to fight the infection. Inform your caregiver if you could be or are pregnant. Some medications used to treat the infection should not be taken during pregnancy. °· Over-the-counter medications or  creams to decrease itching or irritation may be recommended. °· Your sexual partner will need to be treated if infected. °HOME CARE INSTRUCTIONS  °· Take all medication prescribed by your caregiver. °· Take over-the-counter medication for itching or irritation as directed by your caregiver. °· Do not have sexual intercourse while you have the infection. °· Do not douche or wear tampons. °· Discuss your infection with your partner, as your partner may have acquired the infection from you. Or, your partner may have been the person who transmitted the infection to you. °· Have your sex partner examined and treated if necessary. °· Practice safe, informed, and protected sex. °· See your caregiver for other STD testing. °SEEK MEDICAL CARE IF:  °· You still have symptoms after you finish the medication. °· You have an oral temperature above 102° F (38.9° C). °· You develop belly (abdominal) pain. °· You have pain when you urinate. °· You have bleeding after sexual intercourse. °· You develop a rash. °· The medication makes you sick or makes you throw up (vomit). °Document Released: 07/01/2000 Document Revised: 03/30/2011 Document Reviewed: 07/27/2008 °ExitCare® Patient Information ©2014 [employer]. ° °

## 2013-02-17 LAB — GC/CHLAMYDIA PROBE AMP
CT Probe RNA: NEGATIVE
GC Probe RNA: NEGATIVE

## 2013-03-02 DIAGNOSIS — F17200 Nicotine dependence, unspecified, uncomplicated: Secondary | ICD-10-CM | POA: Diagnosis not present

## 2013-03-02 DIAGNOSIS — M25559 Pain in unspecified hip: Secondary | ICD-10-CM | POA: Diagnosis not present

## 2013-03-02 DIAGNOSIS — M545 Low back pain, unspecified: Secondary | ICD-10-CM | POA: Diagnosis not present

## 2013-03-02 DIAGNOSIS — Z79899 Other long term (current) drug therapy: Secondary | ICD-10-CM | POA: Diagnosis not present

## 2013-03-09 ENCOUNTER — Ambulatory Visit: Payer: Medicare Other | Admitting: Advanced Practice Midwife

## 2013-03-29 DIAGNOSIS — M25579 Pain in unspecified ankle and joints of unspecified foot: Secondary | ICD-10-CM | POA: Diagnosis not present

## 2013-03-29 DIAGNOSIS — M25559 Pain in unspecified hip: Secondary | ICD-10-CM | POA: Diagnosis not present

## 2013-03-29 DIAGNOSIS — F17200 Nicotine dependence, unspecified, uncomplicated: Secondary | ICD-10-CM | POA: Diagnosis not present

## 2013-03-29 DIAGNOSIS — Z79899 Other long term (current) drug therapy: Secondary | ICD-10-CM | POA: Diagnosis not present

## 2013-05-03 DIAGNOSIS — M19079 Primary osteoarthritis, unspecified ankle and foot: Secondary | ICD-10-CM | POA: Diagnosis not present

## 2013-05-03 DIAGNOSIS — M5137 Other intervertebral disc degeneration, lumbosacral region: Secondary | ICD-10-CM | POA: Diagnosis not present

## 2013-05-03 DIAGNOSIS — F17200 Nicotine dependence, unspecified, uncomplicated: Secondary | ICD-10-CM | POA: Diagnosis not present

## 2013-05-03 DIAGNOSIS — M25579 Pain in unspecified ankle and joints of unspecified foot: Secondary | ICD-10-CM | POA: Diagnosis not present

## 2013-06-02 DIAGNOSIS — M25559 Pain in unspecified hip: Secondary | ICD-10-CM | POA: Diagnosis not present

## 2013-06-02 DIAGNOSIS — F17200 Nicotine dependence, unspecified, uncomplicated: Secondary | ICD-10-CM | POA: Diagnosis not present

## 2013-06-28 ENCOUNTER — Emergency Department (HOSPITAL_COMMUNITY)
Admission: EM | Admit: 2013-06-28 | Discharge: 2013-06-29 | Disposition: A | Discharge status: Home / Self Care | Payer: Medicare Other | Attending: Emergency Medicine | Admitting: Emergency Medicine

## 2013-06-28 ENCOUNTER — Encounter (HOSPITAL_COMMUNITY): Payer: Self-pay | Admitting: Emergency Medicine

## 2013-06-28 DIAGNOSIS — D638 Anemia in other chronic diseases classified elsewhere: Secondary | ICD-10-CM | POA: Insufficient documentation

## 2013-06-28 DIAGNOSIS — K5900 Constipation, unspecified: Secondary | ICD-10-CM | POA: Diagnosis not present

## 2013-06-28 DIAGNOSIS — R112 Nausea with vomiting, unspecified: Secondary | ICD-10-CM | POA: Insufficient documentation

## 2013-06-28 DIAGNOSIS — Z91040 Latex allergy status: Secondary | ICD-10-CM | POA: Diagnosis not present

## 2013-06-28 DIAGNOSIS — Z9889 Other specified postprocedural states: Secondary | ICD-10-CM | POA: Insufficient documentation

## 2013-06-28 DIAGNOSIS — Z3202 Encounter for pregnancy test, result negative: Secondary | ICD-10-CM | POA: Diagnosis not present

## 2013-06-28 DIAGNOSIS — Z79899 Other long term (current) drug therapy: Secondary | ICD-10-CM | POA: Diagnosis not present

## 2013-06-28 DIAGNOSIS — J4489 Other specified chronic obstructive pulmonary disease: Secondary | ICD-10-CM | POA: Insufficient documentation

## 2013-06-28 DIAGNOSIS — Z880 Allergy status to penicillin: Secondary | ICD-10-CM | POA: Insufficient documentation

## 2013-06-28 DIAGNOSIS — J449 Chronic obstructive pulmonary disease, unspecified: Secondary | ICD-10-CM | POA: Diagnosis not present

## 2013-06-28 DIAGNOSIS — F17200 Nicotine dependence, unspecified, uncomplicated: Secondary | ICD-10-CM | POA: Diagnosis not present

## 2013-06-28 DIAGNOSIS — F329 Major depressive disorder, single episode, unspecified: Secondary | ICD-10-CM | POA: Diagnosis not present

## 2013-06-28 DIAGNOSIS — Z8679 Personal history of other diseases of the circulatory system: Secondary | ICD-10-CM | POA: Diagnosis not present

## 2013-06-28 DIAGNOSIS — N390 Urinary tract infection, site not specified: Secondary | ICD-10-CM

## 2013-06-28 DIAGNOSIS — Z9089 Acquired absence of other organs: Secondary | ICD-10-CM | POA: Diagnosis not present

## 2013-06-28 DIAGNOSIS — E559 Vitamin D deficiency, unspecified: Secondary | ICD-10-CM | POA: Diagnosis not present

## 2013-06-28 DIAGNOSIS — G8929 Other chronic pain: Secondary | ICD-10-CM | POA: Insufficient documentation

## 2013-06-28 DIAGNOSIS — M810 Age-related osteoporosis without current pathological fracture: Secondary | ICD-10-CM | POA: Diagnosis not present

## 2013-06-28 DIAGNOSIS — F3289 Other specified depressive episodes: Secondary | ICD-10-CM | POA: Insufficient documentation

## 2013-06-28 LAB — COMPREHENSIVE METABOLIC PANEL
ALBUMIN: 4.4 g/dL (ref 3.5–5.2)
ALT: 11 U/L (ref 0–35)
AST: 17 U/L (ref 0–37)
Alkaline Phosphatase: 88 U/L (ref 39–117)
BUN: 18 mg/dL (ref 6–23)
CALCIUM: 10.4 mg/dL (ref 8.4–10.5)
CO2: 24 meq/L (ref 19–32)
Chloride: 103 mEq/L (ref 96–112)
Creatinine, Ser: 0.94 mg/dL (ref 0.50–1.10)
GFR calc Af Amer: >90 mL/min (ref >90)
GFR calc non Af Amer: 78 mL/min — ABNORMAL LOW (ref >90)
Glucose, Bld: 99 mg/dL (ref 70–99)
Potassium: 4.1 mEq/L (ref 3.7–5.3)
SODIUM: 143 meq/L (ref 137–147)
TOTAL PROTEIN: 7.9 g/dL (ref 6.0–8.3)
Total Bilirubin: 0.3 mg/dL (ref 0.3–1.2)

## 2013-06-28 LAB — URINALYSIS, ROUTINE W REFLEX MICROSCOPIC
Bilirubin Urine: NEGATIVE
GLUCOSE, UA: NEGATIVE mg/dL
HGB URINE DIPSTICK: NEGATIVE
Ketones, ur: 15 mg/dL — AB
Nitrite: POSITIVE — AB
Protein, ur: NEGATIVE mg/dL
Specific Gravity, Urine: 1.027 (ref 1.005–1.030)
Urobilinogen, UA: 1 mg/dL (ref 0.0–1.0)
pH: 7.5 (ref 5.0–8.0)

## 2013-06-28 LAB — CBC WITH DIFFERENTIAL/PLATELET
BASOS ABS: 0 10*3/uL (ref 0.0–0.1)
BASOS PCT: 0 % (ref 0–1)
EOS PCT: 0 % (ref 0–5)
Eosinophils Absolute: 0 10*3/uL (ref 0.0–0.7)
HCT: 37.1 % (ref 36.0–46.0)
Hemoglobin: 12.6 g/dL (ref 12.0–15.0)
LYMPHS PCT: 25 % (ref 12–46)
Lymphs Abs: 2.8 10*3/uL (ref 0.7–4.0)
MCH: 30.5 pg (ref 26.0–34.0)
MCHC: 34 g/dL (ref 30.0–36.0)
MCV: 89.8 fL (ref 78.0–100.0)
Monocytes Absolute: 0.8 10*3/uL (ref 0.1–1.0)
Monocytes Relative: 8 % (ref 3–12)
Neutro Abs: 7.6 10*3/uL (ref 1.7–7.7)
Neutrophils Relative %: 67 % (ref 43–77)
PLATELETS: 189 10*3/uL (ref 150–400)
RBC: 4.13 MIL/uL (ref 3.87–5.11)
RDW: 13.7 % (ref 11.5–15.5)
WBC: 11.2 10*3/uL — ABNORMAL HIGH (ref 4.0–10.5)

## 2013-06-28 LAB — POC URINE PREG, ED: PREG TEST UR: NEGATIVE

## 2013-06-28 LAB — URINE MICROSCOPIC-ADD ON

## 2013-06-28 LAB — LIPASE, BLOOD: Lipase: 31 U/L (ref 11–59)

## 2013-06-28 MED ORDER — CIPROFLOXACIN HCL 500 MG PO TABS: 500.0000 mg | ORAL_TABLET | Freq: Two times a day (BID) | ORAL | Status: DC

## 2013-06-28 MED ORDER — PROMETHAZINE HCL 25 MG RE SUPP: 25.0000 mg | Freq: Four times a day (QID) | RECTAL | Status: DC | PRN

## 2013-06-28 MED ORDER — HYDROMORPHONE HCL PF 1 MG/ML IJ SOLN
1.0000 mg | Freq: Once | INTRAMUSCULAR | Status: AC
  Administered 2013-06-28: 1 mg via INTRAVENOUS
  Filled 2013-06-28: qty 1

## 2013-06-28 MED ORDER — CIPROFLOXACIN IN D5W 400 MG/200ML IV SOLN
400.0000 mg | Freq: Once | INTRAVENOUS | Status: AC
  Administered 2013-06-28: 400 mg via INTRAVENOUS
  Filled 2013-06-28: qty 200

## 2013-06-28 MED ORDER — OXYCODONE-ACETAMINOPHEN 5-325 MG PO TABS: 1.0000 | ORAL_TABLET | Freq: Four times a day (QID) | ORAL | Status: DC | PRN

## 2013-06-28 MED ORDER — ONDANSETRON HCL 4 MG/2ML IJ SOLN
4.0000 mg | Freq: Once | INTRAMUSCULAR | Status: AC
  Administered 2013-06-28: 4 mg via INTRAVENOUS
  Filled 2013-06-28: qty 2

## 2013-06-28 MED ORDER — ONDANSETRON 4 MG PO TBDP: 4.0000 mg | ORAL_TABLET | Freq: Three times a day (TID) | ORAL | Status: DC | PRN

## 2013-06-28 MED ORDER — SODIUM CHLORIDE 0.9 % IV BOLUS (SEPSIS)
1000.0000 mL | Freq: Once | INTRAVENOUS | Status: AC
  Administered 2013-06-28: 1000 mL via INTRAVENOUS

## 2013-06-28 NOTE — ED Notes (Signed)
Pt given sprite for fluid challenge °

## 2013-06-28 NOTE — ED Provider Notes (Signed)
CSN: [id number]     Arrival date & time 06/28/13  1715 History   First MD Initiated Contact with Patient 06/28/13 2058     Chief Complaint  Patient presents with  . Abdominal Pain  . Emesis     (Consider location/radiation/quality/duration/timing/severity/associated sxs/prior Treatment) HPI Pt presenting with c/o nausea and vomiting.  No diarrhea. Diffuse abdominal discomfort.  She states symptmos feel similar to her prior gastroparesis.  Symptoms began yesterday, she felt some improvement today, until was out in the heat most of the day and symptoms recurred.  Emesis nonbloody and nonbilious.  States she has had > 20 episodes of emesis.  No diarrhea.  No fever/chills.  Was not able to take any nausea meds as she could not keep them down.  Denies dysuria, but states she has seen that her urine is darker.  There are no other associated systemic symptoms, there are no other alleviating or modifying factors.   Past Medical History  Diagnosis Date  . Migraines   . Osteoporosis   . Seasonal allergies   . Sinusitis   . Back pain, chronic   . Nicotine addiction   . Constipation   . Vitamin D deficiency   . Gastritis   . Gastroparesis   . Pyloric spasm 03/30/2011  . Gastric outlet obstruction   . COPD (chronic obstructive pulmonary disease)   . Asthma   . Substance abuse 2008    marijuana  . Peptic ulcer disease   . Depression 2000    h/o suicidal ideation  . Anemia of other chronic disease 11/29/2012  . Chronic abdominal pain   . Nausea and vomiting     recurrent   Past Surgical History  Procedure Laterality Date  . Cholecystectomy      ?2002  . Laser surgery on cervix    . Carpal tunnel release      left hand  . Esophagogastroduodenoscopy  12/25/2010    Dorothyann Peng, MD;  moderate gastritis, ?goo secondary to pylorspasm. BX showed reactive gstropathy no h.pyori, SB mucosa with intramucosal lymphocytosis and partial villous blunting (TTG 4.0 normal)  . Esophageal dilation   12/25/2010    Procedure: ESOPHAGEAL DILATION;  Surgeon: Dorothyann Peng, MD;  [facility];  Service: Endoscopy;;  . Esophagogastroduodenoscopy N/A 11/14/2012    NAT:FTDDU hiatal hernia. Abnormal gastric mucosa of  uncertain significance-status post biopsy. Subjectively, patient may have recurrent symptomatic, pylorospasm.  . Esophagogastroduodenoscopy (egd) with propofol N/A 02/09/2013    Procedure: ESOPHAGOGASTRODUODENOSCOPY (EGD) WITH PROPOFOL;  Surgeon: Missy Sabins, MD;  [facility];  Service: Endoscopy;  Laterality: N/A;  . Botox injection N/A 02/09/2013    Procedure: BOTOX INJECTION;  Surgeon: Missy Sabins, MD;  [facility];  Service: Endoscopy;  Laterality: N/A;  possible balloon  . Balloon dilation N/A 02/09/2013    Procedure: BALLOON DILATION;  Surgeon: Missy Sabins, MD;  [facility];  Service: Endoscopy;  Laterality: N/A;   Family History  Problem Relation Age of Onset  . Diabetes Father   . Liver disease Father     liver transplant at [facility], age 31  . Colon cancer Neg Hx   . Lung cancer Mother   . Heart disease Maternal Grandmother   . Parkinson's disease Maternal Grandfather    History  Substance Use Topics  . Smoking status: Current Every Day Smoker -- 0.50 packs/day for 15 years    Types: Cigarettes  . Smokeless tobacco: Never Used  Comment: quit 2011  . Alcohol Use: No     Comment: none since July. Periodic heavy drinker for months at a time.   OB History   Grav Para Term Preterm Abortions TAB SAB Ect Mult Living                 Review of Systems ROS reviewed and all otherwise negative except for mentioned in HPI    Allergies  Latex and Penicillins  Home Medications   Prior to Admission medications   Medication Sig Start Date End Date Taking? Authorizing Provider  albuterol (PROVENTIL HFA;VENTOLIN HFA) 108 (90 BASE) MCG/ACT inhaler Inhale 2 puffs into the lungs every 6 (six) hours as needed. Shortness of breath  11/14/12 05/27/15 Yes [name], MD  budesonide-formoterol [facility]) 80-4.5 MCG/ACT inhaler Inhale 2 puffs into the lungs 2 (two) times daily. 11/14/12 05/27/15 Yes [name], MD  calcium-vitamin D (OSCAL 500/200 D-3) 500-200 MG-UNIT per tablet Take 1 tablet by mouth 2 (two) times daily. 11/14/12 11/14/13 Yes [name], MD  Cholecalciferol (VITAMIN D3) 2000 UNITS CHEW Chew 1 capsule by mouth daily.    Yes Historical Provider, MD  clobetasol cream (TEMOVATE) 1.96 % Apply 1 application topically 2 (two) times daily. Apply to rash   Yes Historical Provider, MD  dexlansoprazole (DEXILANT) 60 MG capsule Take 1 capsule (60 mg total) by mouth daily before breakfast. 12/19/12  Yes Mahala Menghini, PA-C  DULoxetine (CYMBALTA) 30 MG capsule Take 1 capsule (30 mg total) by mouth daily. 03/02/12 09/12/14 Yes [name], MD  ergocalciferol (VITAMIN D2) 50000 UNITS capsule Take 1 capsule (50,000 Units total) by mouth once a week. One capsule once weekly 11/14/12  Yes [name], MD  estradiol (ESTRACE VAGINAL) 0.1 MG/GM vaginal cream Place 2 g vaginally daily. As directed   Yes Historical Provider, MD  feeding supplement (ENSURE CLINICAL STRENGTH) LIQD Take 237 mLs by mouth 3 (three) times daily with meals. 01/01/11  Yes Nishant Dhungel, MD  ferrous sulfate 325 (65 FE) MG tablet Take 325 mg by mouth daily with breakfast.   Yes Historical Provider, MD  fluticasone (CUTIVATE) 0.05 % cream Apply 1 application topically 2 (two) times daily.   Yes Historical Provider, MD  fluticasone (FLONASE) 50 MCG/ACT nasal spray Place 2 sprays into both nostrils daily.   Yes Historical Provider, MD  Linaclotide Rolan Lipa) 145 MCG CAPS capsule Take 1 capsule (145 mcg total) by mouth daily. 12/19/12  Yes Mahala Menghini, PA-C  loratadine (CLARITIN) 10 MG tablet Take 1 tablet (10 mg total) by mouth daily. 10/09/11  Yes [name], MD  Multiple Vitamin (MULTIVITAMIN WITH MINERALS) TABS tablet  Take 1 tablet by mouth daily.   Yes Historical Provider, MD  simvastatin (ZOCOR) 20 MG tablet Take 20 mg by mouth daily.   Yes Historical Provider, MD  ciprofloxacin (CIPRO) 500 MG tablet Take 1 tablet (500 mg total) by mouth 2 (two) times daily. 06/28/13   Threasa Beards, MD  ondansetron (ZOFRAN ODT) 4 MG disintegrating tablet Take 1 tablet (4 mg total) by mouth every 8 (eight) hours as needed for nausea or vomiting. 06/28/13   Threasa Beards, MD  oxyCODONE-acetaminophen (PERCOCET/ROXICET) 5-325 MG per tablet Take 1-2 tablets by mouth every 6 (six) hours as needed for severe pain. 06/28/13   Threasa Beards, MD  promethazine (PHENERGAN) 25 MG suppository Place 1 suppository (25 mg total) rectally every 6 (six) hours as needed for nausea or vomiting. 06/28/13  Threasa Beards, MD   BP 134/71  Pulse 50  Temp(Src) 98.2 F (36.8 C) (Oral)  Resp 13  SpO2 100%  LMP 01/19/1997 Vitals reviewed Physical Exam Physical Examination: General appearance - alert, well appearing, and in no distress Mental status - alert, oriented to person, place, and time Eyes - no conjunctival injection, no scleral icterus Mouth - mucous membranes moist, pharynx normal without lesions Chest - clear to auscultation, no wheezes, rales or rhonchi, symmetric air entry Heart - normal rate, regular rhythm, normal S1, S2, no murmurs, rubs, clicks or gallops Abdomen - soft, diffuse tenderness to palpation- worse in the epigastric region, no gaurding or rebound tenderness, nondistended, no masses or organomegaly Extremities - peripheral pulses normal, no pedal edema, no clubbing or cyanosis Skin - normal coloration and turgor, no rashes  ED Course  Procedures (including critical care time)  11:17 PM on recheck patient feeling much improved, she has been able to tolerate po challenge.  She is receiving her cipro.  Requests another dose of pain medication, then feels ok with plan for discharge.   Labs Review Labs Reviewed   CBC WITH DIFFERENTIAL - Abnormal; Notable for the following:    WBC 11.2 (*)    All other components within normal limits  COMPREHENSIVE METABOLIC PANEL - Abnormal; Notable for the following:    GFR calc non Af Amer 78 (*)    All other components within normal limits  URINALYSIS, ROUTINE W REFLEX MICROSCOPIC - Abnormal; Notable for the following:    APPearance CLOUDY (*)    Ketones, ur 15 (*)    Nitrite POSITIVE (*)    Leukocytes, UA TRACE (*)    All other components within normal limits  URINE MICROSCOPIC-ADD ON - Abnormal; Notable for the following:    Bacteria, UA MANY (*)    All other components within normal limits  URINE CULTURE  LIPASE, BLOOD  POC URINE PREG, ED    Imaging Review No results found.   EKG Interpretation None      MDM   Final diagnoses:  Nausea and vomiting  UTI (lower urinary tract infection)    Pt presenting nausea and vomiting and epigastric pain.  Labs are reassuring with the exception of urine which is c/w UTI.  Pt started on cipro.  Pt treated with IV hydration, pain medication and nausea medications.  Pt is feeling improved and able to tolerate po fluids.  Discharged with pain/nausea meds and abx for UTI.  I have discussed strict precautions to return to the ER including or any other new or worsening symptoms. The patient understands and accepts the medical plan as it's been recorded in the chart, and I have answered their questions. Discharge instructions concerning home care and prescriptions have been given. The patient is stable and is discharged to home in good condition.     Threasa Beards, MD 06/28/13 [phone]

## 2013-06-28 NOTE — ED Notes (Signed)
Pt presents to department for evaluation of diffuse abdominal pain and N/V. Ongoing for several days. 10/10 pain upon arrival to ED. Pt is alert and oriented x4.

## 2013-06-28 NOTE — ED Notes (Signed)
Pt placed in gown and on monitor upon arrival to room. Pt being monitored by pulse ox, blood pressure, and 5 lead.

## 2013-06-28 NOTE — Discharge Instructions (Signed)
Return to the ED with any concerns including vomiting and not able to keep down liquids or antibiotics, worsening abdominal pain, decreased level of alertness/lethargy, or any other alarming symptoms

## 2013-06-30 DIAGNOSIS — M25559 Pain in unspecified hip: Secondary | ICD-10-CM | POA: Diagnosis not present

## 2013-06-30 DIAGNOSIS — F17200 Nicotine dependence, unspecified, uncomplicated: Secondary | ICD-10-CM | POA: Diagnosis not present

## 2013-06-30 LAB — URINE CULTURE: Colony Count: >=100000

## 2013-07-06 ENCOUNTER — Telehealth (HOSPITAL_BASED_OUTPATIENT_CLINIC_OR_DEPARTMENT_OTHER): Payer: Self-pay | Admitting: Emergency Medicine

## 2013-07-06 NOTE — Telephone Encounter (Signed)
Post ED Visit - Positive Culture Follow-up  Culture report reviewed by antimicrobial stewardship pharmacist: []  Wes Edge[place], Pharm.D., BCPS []  Heide Guile, Pharm.D., BCPS [x]  Alycia Rossetti, Pharm.D., BCPS []  Lynn [place], Pharm.D., BCPS, AAHIVP []  Legrand Como, Pharm.D., BCPS, AAHIVP []  Juliene Pina, Pharm.D.  Positive urine culture Treated with Cipro, organism sensitive to the same and no further patient follow-up is required at this time.  Myrna Blazer 07/06/2013, 8:18 PM

## 2013-07-28 DIAGNOSIS — M25559 Pain in unspecified hip: Secondary | ICD-10-CM | POA: Diagnosis not present

## 2013-07-28 DIAGNOSIS — F17200 Nicotine dependence, unspecified, uncomplicated: Secondary | ICD-10-CM | POA: Diagnosis not present

## 2013-08-01 ENCOUNTER — Encounter: Payer: Self-pay | Admitting: Family Medicine

## 2013-08-01 ENCOUNTER — Ambulatory Visit (INDEPENDENT_AMBULATORY_CARE_PROVIDER_SITE_OTHER): Payer: Medicare Other | Admitting: Family Medicine

## 2013-08-01 VITALS — BP 126/62 | HR 82 | Resp 18 | Ht 66.0 in | Wt 133.1 lb

## 2013-08-01 DIAGNOSIS — N3000 Acute cystitis without hematuria: Secondary | ICD-10-CM

## 2013-08-01 DIAGNOSIS — F3289 Other specified depressive episodes: Secondary | ICD-10-CM

## 2013-08-01 DIAGNOSIS — F1999 Other psychoactive substance use, unspecified with unspecified psychoactive substance-induced disorder: Secondary | ICD-10-CM

## 2013-08-01 DIAGNOSIS — R51 Headache: Secondary | ICD-10-CM | POA: Diagnosis not present

## 2013-08-01 DIAGNOSIS — M899 Disorder of bone, unspecified: Secondary | ICD-10-CM | POA: Diagnosis not present

## 2013-08-01 DIAGNOSIS — Z79899 Other long term (current) drug therapy: Secondary | ICD-10-CM

## 2013-08-01 DIAGNOSIS — F17209 Nicotine dependence, unspecified, with unspecified nicotine-induced disorders: Secondary | ICD-10-CM

## 2013-08-01 DIAGNOSIS — J45991 Cough variant asthma: Secondary | ICD-10-CM

## 2013-08-01 DIAGNOSIS — R1110 Vomiting, unspecified: Secondary | ICD-10-CM

## 2013-08-01 DIAGNOSIS — N912 Amenorrhea, unspecified: Secondary | ICD-10-CM

## 2013-08-01 DIAGNOSIS — M949 Disorder of cartilage, unspecified: Secondary | ICD-10-CM | POA: Diagnosis not present

## 2013-08-01 DIAGNOSIS — R1115 Cyclical vomiting syndrome unrelated to migraine: Secondary | ICD-10-CM

## 2013-08-01 DIAGNOSIS — J449 Chronic obstructive pulmonary disease, unspecified: Secondary | ICD-10-CM

## 2013-08-01 DIAGNOSIS — F17200 Nicotine dependence, unspecified, uncomplicated: Secondary | ICD-10-CM

## 2013-08-01 DIAGNOSIS — F329 Major depressive disorder, single episode, unspecified: Secondary | ICD-10-CM

## 2013-08-01 DIAGNOSIS — L659 Nonscarring hair loss, unspecified: Secondary | ICD-10-CM | POA: Diagnosis not present

## 2013-08-01 LAB — POCT URINALYSIS DIPSTICK
BILIRUBIN UA: NEGATIVE
Blood, UA: NEGATIVE
GLUCOSE UA: NEGATIVE
LEUKOCYTES UA: NEGATIVE
NITRITE UA: NEGATIVE
Protein, UA: NEGATIVE
Spec Grav, UA: >=1.03
Urobilinogen, UA: 0.2
pH, UA: 6

## 2013-08-01 MED ORDER — MINOXIDIL 2 % EX SOLN: Freq: Two times a day (BID) | CUTANEOUS | Status: DC

## 2013-08-01 MED ORDER — METHYLPREDNISOLONE ACETATE 80 MG/ML IJ SUSP
80.0000 mg | Freq: Once | INTRAMUSCULAR | Status: AC
  Administered 2013-08-01: 80 mg via INTRAMUSCULAR

## 2013-08-01 MED ORDER — KETOROLAC TROMETHAMINE 60 MG/2ML IM SOLN
60.0000 mg | Freq: Once | INTRAMUSCULAR | Status: AC
  Administered 2013-08-01: 60 mg via INTRAMUSCULAR

## 2013-08-01 NOTE — Progress Notes (Signed)
Subjective:    Patient ID: Tracey Daniel, female    DOB: 01-18-1981, 33 y.o.   MRN: [id number]  HPI The PT is here for follow up and re-evaluation of chronic medical conditions, medication management and review of any available recent lab and radiology data.  Preventive health is updated, specifically  Cancer screening and Immunization.   Needs to reschyed GI f/u at [facility] for severe pyloric spasm and chronic nausea The PT denies any adverse reactions to current medications since the last visit.  5 month h/o severe migraines from back of head to right eye, with blurry vision of right eye, mild nausea and light headedness, new in past 5 months Has a headache 4 to 5 days per week, using tylenol and hydrocodone which is prescribed by Dr Luna Glasgow for hip pain and ankle (rigth) prescribed hydrocodone for this , states she does not take as many tabs as prescribed, we have discussed this in the past , and her need to address with prescriber Denies uncontrolled depression, not tearful suicidal or homicidal   Review of Systems See HPI Denies recent fever or chills. Denies sinus pressure, nasal congestion, ear pain or sore throat. Denies chest congestion, productive cough or wheezing. Denies chest pains, palpitations and leg swelling Denies abdominal pain, nausea, vomiting,diarrhea or constipation.   Denies dysuria, frequency, hesitancy or incontinence. Denies joint pain, swelling and limitation in mobility. C/o worsening hair loss wants medicatio for this . Denies change in hair products       Objective:   Physical Exam BP 126/62  Pulse 82  Resp 18  Ht 5\' 6"  (1.676 m)  Wt 133 lb 1.9 oz (60.383 kg)  BMI 21.50 kg/m2  SpO2 98%  LMP 01/19/1997 Patient alert and oriented and in no cardiopulmonary distress.  HEENT: No facial asymmetry, EOMI,   oropharynx pink and moist.  Neck supple no JVD, no mass.  Chest: Clear to auscultation bilaterally.  CVS: S1, S2 no murmurs, no S3.Regular  rate.  ABD: Soft non tender.   Ext: No edema  MS: Adequate ROM spine, shoulders, hips and knees.  Skin: Intact, no ulcerations or rash noted.Allo[pecia in posterior head  Psych: Good eye contact, flat  affect. Memory intact not anxious or depressed appearing.  CNS: CN 2-12 intact, power,  normal throughout.no focal deficits noted.        Assessment & Plan:  HEADACHE 5 month h/o worsening headache with blurred right eye vision, toradol and depo medrol in office and refer to headache clinic, no focal deficits on exam at this visit  Intractable nausea and vomiting Multiple Ed visits and hospitalization also, needs to return to GI at  [facility] where she is reciving care for this , she will call t to resched a missed appt  AMENORRHEA, SECONDARY Gyne folloows pt and does annual exams, she has veryt small ovaries and no h/o menses  DEPRESSION Reports improved control, followed in [place] by mental health services, not suicidal or homicidal  Asthma, cough variant Controlled, no change in medication   Chronic obstructive asthma, unspecified Worsening due to ongoing nicotine use  Nicotine dependence Increased use , still wants to quit, unwilling to set quit date today Patient counseled for approximately 5 minutes regarding the health risks of ongoing nicotine use, specifically all types of cancer, heart disease, stroke and respiratory failure. The options available for help with cessation ,the behavioral changes to assist the process, and the option to either gradully reduce usage  Or abruptly stop.is also discussed.  Pt is also encouraged to set specific goals in number of cigarettes used daily, as well as to set a quit date.   Alopecia Topical rogaine prescribed

## 2013-08-01 NOTE — Patient Instructions (Signed)
Annual wellness in early Nov, call if you need me before  PLS BRING MEDICATION TO VISIT  Eye exam is within normal  You are referred to neurologist re new headaches  Toradol 60mg  and depo medrol 80 mg in office today  Continue to work on smoking cessation, you need to [place], Numa quit NOW for help on the telephone that is free  Urine shows no infection, but you need to increase water intake  Topical rogaine for women is sent in , you will have to buy this oTC  Fasting lipid, TSH and vit D as soon as possible  Reschedule appt with [facility] GI please

## 2013-08-01 NOTE — Assessment & Plan Note (Addendum)
5 month h/o worsening headache with blurred right eye vision, toradol and depo medrol in office and refer to headache clinic, no focal deficits on exam at this visit

## 2013-08-06 NOTE — Assessment & Plan Note (Signed)
Reports improved control, followed in [place] by mental health services, not suicidal or homicidal

## 2013-08-06 NOTE — Assessment & Plan Note (Signed)
Controlled, no change in medication  

## 2013-08-06 NOTE — Assessment & Plan Note (Signed)
Gyne folloows pt and does annual exams, she has veryt small ovaries and no h/o menses

## 2013-08-06 NOTE — Assessment & Plan Note (Signed)
Topical rogaine prescribed

## 2013-08-06 NOTE — Assessment & Plan Note (Signed)
Multiple Ed visits and hospitalization also, needs to return to GI at  [facility] where she is reciving care for this , she will call t to resched a missed appt

## 2013-08-06 NOTE — Assessment & Plan Note (Signed)
Increased use , still wants to quit, unwilling to set quit date today Patient counseled for approximately 5 minutes regarding the health risks of ongoing nicotine use, specifically all types of cancer, heart disease, stroke and respiratory failure. The options available for help with cessation ,the behavioral changes to assist the process, and the option to either gradully reduce usage  Or abruptly stop.is also discussed. Pt is also encouraged to set specific goals in number of cigarettes used daily, as well as to set a quit date.

## 2013-08-06 NOTE — Assessment & Plan Note (Signed)
Worsening due to ongoing nicotine use  

## 2013-08-16 ENCOUNTER — Ambulatory Visit: Payer: Medicare Other | Admitting: Neurology

## 2013-08-24 DIAGNOSIS — Z79899 Other long term (current) drug therapy: Secondary | ICD-10-CM | POA: Diagnosis not present

## 2013-08-24 DIAGNOSIS — L659 Nonscarring hair loss, unspecified: Secondary | ICD-10-CM | POA: Diagnosis not present

## 2013-08-24 DIAGNOSIS — M949 Disorder of cartilage, unspecified: Secondary | ICD-10-CM | POA: Diagnosis not present

## 2013-08-24 DIAGNOSIS — M899 Disorder of bone, unspecified: Secondary | ICD-10-CM | POA: Diagnosis not present

## 2013-08-24 LAB — TSH: TSH: 0.533 u[IU]/mL (ref 0.350–4.500)

## 2013-08-24 LAB — LIPID PANEL
Cholesterol: 164 mg/dL (ref 0–200)
HDL: 44 mg/dL (ref >39)
LDL Cholesterol: 106 mg/dL — ABNORMAL HIGH (ref 0–99)
TRIGLYCERIDES: 70 mg/dL (ref <150)
Total CHOL/HDL Ratio: 3.7 Ratio
VLDL: 14 mg/dL (ref 0–40)

## 2013-08-25 DIAGNOSIS — M25559 Pain in unspecified hip: Secondary | ICD-10-CM | POA: Diagnosis not present

## 2013-08-25 DIAGNOSIS — F17200 Nicotine dependence, unspecified, uncomplicated: Secondary | ICD-10-CM | POA: Diagnosis not present

## 2013-08-25 LAB — VITAMIN D 25 HYDROXY (VIT D DEFICIENCY, FRACTURES): VIT D 25 HYDROXY: 38 ng/mL (ref 30–89)

## 2013-08-28 DIAGNOSIS — Z029 Encounter for administrative examinations, unspecified: Secondary | ICD-10-CM

## 2013-09-19 ENCOUNTER — Telehealth: Payer: Self-pay | Admitting: Internal Medicine

## 2013-09-19 MED ORDER — ONDANSETRON HCL 4 MG PO TABS: 4.0000 mg | ORAL_TABLET | Freq: Three times a day (TID) | ORAL | Status: DC | PRN

## 2013-09-19 MED ORDER — DEXLANSOPRAZOLE 60 MG PO CPDR: 60.0000 mg | DELAYED_RELEASE_CAPSULE | Freq: Every day | ORAL | Status: DC

## 2013-09-19 NOTE — Telephone Encounter (Signed)
Completed.

## 2013-09-19 NOTE — Telephone Encounter (Signed)
Routing to refill box  

## 2013-09-19 NOTE — Telephone Encounter (Signed)
Patient is needing a refill on Dexilant 60mg , and Zofran for nausea. [phone] uses [facility] on [address] in [place].  She is also scheduling an appointment.

## 2013-09-20 ENCOUNTER — Ambulatory Visit: Payer: Medicare Other | Admitting: Neurology

## 2013-09-21 ENCOUNTER — Telehealth: Payer: Self-pay | Admitting: Internal Medicine

## 2013-09-21 NOTE — Telephone Encounter (Signed)
Patient is here stating she is out of stomach medicine and her insides are burning.

## 2013-09-21 NOTE — Telephone Encounter (Signed)
PATIENT STATES ONLY NAUSEA MEDICINE WAS CALLED INTO PHARMACY

## 2013-09-21 NOTE — Telephone Encounter (Signed)
I called pharmacy- they have both rx's. Pt picked up zofran but it is too soon for her to get dexilant. She just picked it up on 09/01/13 and the insurance will not pay for it early. I called pt and left a voicemail with this information.

## 2013-09-27 ENCOUNTER — Ambulatory Visit: Payer: Medicare Other | Admitting: Neurology

## 2013-09-29 DIAGNOSIS — M25559 Pain in unspecified hip: Secondary | ICD-10-CM | POA: Diagnosis not present

## 2013-09-29 DIAGNOSIS — M549 Dorsalgia, unspecified: Secondary | ICD-10-CM | POA: Diagnosis not present

## 2013-09-29 DIAGNOSIS — Z79899 Other long term (current) drug therapy: Secondary | ICD-10-CM | POA: Diagnosis not present

## 2013-09-29 DIAGNOSIS — F17200 Nicotine dependence, unspecified, uncomplicated: Secondary | ICD-10-CM | POA: Diagnosis not present

## 2013-10-19 ENCOUNTER — Encounter: Payer: Self-pay | Admitting: Gastroenterology

## 2013-10-19 ENCOUNTER — Ambulatory Visit (INDEPENDENT_AMBULATORY_CARE_PROVIDER_SITE_OTHER): Payer: Medicare Other | Admitting: Gastroenterology

## 2013-10-19 VITALS — BP 97/59 | HR 65 | Temp 98.0°F | Ht 66.0 in | Wt 129.6 lb

## 2013-10-19 DIAGNOSIS — K219 Gastro-esophageal reflux disease without esophagitis: Secondary | ICD-10-CM

## 2013-10-19 DIAGNOSIS — R197 Diarrhea, unspecified: Secondary | ICD-10-CM

## 2013-10-19 DIAGNOSIS — K313 Pylorospasm, not elsewhere classified: Secondary | ICD-10-CM

## 2013-10-19 MED ORDER — PANTOPRAZOLE SODIUM 40 MG PO TBEC: 40.0000 mg | DELAYED_RELEASE_TABLET | Freq: Two times a day (BID) | ORAL | Status: DC

## 2013-10-19 MED ORDER — SUCRALFATE 1 GM/10ML PO SUSP: 1.0000 g | Freq: Three times a day (TID) | ORAL | Status: DC

## 2013-10-19 NOTE — Progress Notes (Signed)
Primary Care Physician: [name], MD  Primary Gastroenterologist:  Garfield Cornea, MD   Chief Complaint  Patient presents with  . Abdominal Pain  . Weight Loss    HPI: Tracey Daniel is a 33 y.o. female here for followup. She has a history of gastroesophageal reflux disease, gastroparesis, pylorospasm resulting in gastric outlet obstruction, constipation. We last saw her in December 2014. She was unable to keep her appointment with Dr. Derrill Kay in 01/2013 because she ended up in the hospital. She was admitted in [place] and underwent GI evaluation there. Dr. Amedeo Plenty performed EGD which revealed elongated stomach, widely patent Schatzki remained, no evidence of pyloric stenosis, status post Botox injection. Patient states her symptoms improved for a period of time but have returned a few months later.  Complains of bad heartburn, never has relief. Postprandial epigastric tenderness even with liquids. Feels like her stomach is not emptying. Vomiting about once per week. Weight in 01/2013, 151 pounds. Down to 129 pounds. Has upcoming appointment at [facility] 11/2013 with Dr. Derrill Kay. Bowel movements have changed. Started her on Linzess last fall for constipation. Few months ago started having 5-6 loose stools daily. No solid stools. No nocturnal BMs. +fecal incontinence. No melena, brbpr. No dysphagia. Iron and Zofran daily.   Current Outpatient Prescriptions  Medication Sig Dispense Refill  . albuterol (PROVENTIL HFA;VENTOLIN HFA) 108 (90 BASE) MCG/ACT inhaler Inhale 2 puffs into the lungs every 6 (six) hours as needed. Shortness of breath  6.7 g  4  . budesonide-formoterol (SYMBICORT) 80-4.5 MCG/ACT inhaler Inhale 2 puffs into the lungs 2 (two) times daily.  1 Inhaler  4  . calcium-vitamin D (OSCAL 500/200 D-3) 500-200 MG-UNIT per tablet Take 1 tablet by mouth 2 (two) times daily.  180 tablet  3  . Cholecalciferol (VITAMIN D3) 2000 UNITS CHEW Chew 1 capsule by mouth daily.       .  clobetasol cream (TEMOVATE) 4.81 % Apply 1 application topically 2 (two) times daily. Apply to rash      . dexlansoprazole (DEXILANT) 60 MG capsule Take 1 capsule (60 mg total) by mouth daily before breakfast.  31 capsule  5  . DULoxetine (CYMBALTA) 30 MG capsule Take 1 capsule (30 mg total) by mouth daily.  30 capsule  4  . ergocalciferol (VITAMIN D2) 50000 UNITS capsule Take 1 capsule (50,000 Units total) by mouth once a week. One capsule once weekly  12 capsule  1  . estradiol (ESTRACE VAGINAL) 0.1 MG/GM vaginal cream Place 2 g vaginally daily. As directed      . feeding supplement (ENSURE CLINICAL STRENGTH) LIQD Take 237 mLs by mouth 3 (three) times daily with meals.  10 Bottle  3  . ferrous sulfate 325 (65 FE) MG tablet Take 325 mg by mouth daily with breakfast.      . fluticasone (CUTIVATE) 0.05 % cream Apply 1 application topically 2 (two) times daily.      . fluticasone (FLONASE) 50 MCG/ACT nasal spray Place 2 sprays into both nostrils daily.      Marland Kitchen HYDROcodone-acetaminophen (NORCO) 7.5-325 MG per tablet Take 1 tablet by mouth every 4 (four) hours as needed.       . Linaclotide (LINZESS) 145 MCG CAPS capsule Take 1 capsule (145 mcg total) by mouth daily.  30 capsule  1  . loratadine (CLARITIN) 10 MG tablet Take 1 tablet (10 mg total) by mouth daily.  30 tablet  5  . minoxidil (MINOXIDIL FOR WOMEN) 2 %  external solution Apply topically 2 (two) times daily.  60 mL  1  . Multiple Vitamin (MULTIVITAMIN WITH MINERALS) TABS tablet Take 1 tablet by mouth daily.      . ondansetron (ZOFRAN) 4 MG tablet Take 1 tablet (4 mg total) by mouth every 8 (eight) hours as needed for nausea or vomiting.  30 tablet  1  . simvastatin (ZOCOR) 20 MG tablet Take 20 mg by mouth daily.       No current facility-administered medications for this visit.    Allergies as of 10/19/2013 - Review Complete 10/19/2013  Allergen Reaction Noted  . Latex Itching and Other (See Comments) 11/30/2011  . Penicillins     Past  Medical History  Diagnosis Date  . Migraines   . Osteoporosis   . Seasonal allergies   . Sinusitis   . Back pain, chronic   . Nicotine addiction   . Constipation   . Vitamin D deficiency   . Gastritis   . Gastroparesis   . Pyloric spasm 03/30/2011  . Gastric outlet obstruction   . COPD (chronic obstructive pulmonary disease)   . Asthma   . Substance abuse 2008    marijuana  . Peptic ulcer disease   . Depression 2000    h/o suicidal ideation  . Anemia of other chronic disease 11/29/2012  . Chronic abdominal pain   . Nausea and vomiting     recurrent   Past Surgical History  Procedure Laterality Date  . Cholecystectomy      ?2002  . Laser surgery on cervix    . Carpal tunnel release      left hand  . Esophagogastroduodenoscopy  12/25/2010    Dorothyann Peng, MD;  moderate gastritis, ?goo secondary to pylorspasm. BX showed reactive gstropathy no h.pyori, SB mucosa with intramucosal lymphocytosis and partial villous blunting (TTG 4.0 normal)  . Esophageal dilation  12/25/2010    Procedure: ESOPHAGEAL DILATION;  Surgeon: Dorothyann Peng, MD;  [facility];  Service: Endoscopy;;  . Esophagogastroduodenoscopy N/A 11/14/2012    HOZ:YYQMG hiatal hernia. Abnormal gastric mucosa of  uncertain significance-status post biopsy. Subjectively, patient may have recurrent symptomatic, pylorospasm.  . Esophagogastroduodenoscopy (egd) with propofol N/A 02/09/2013    elongated stomach, partial lower esophageal ring widely patent. No obvious pyloric stenosis s/p Botox  . Botox injection N/A 02/09/2013    Procedure: BOTOX INJECTION;  Surgeon: Missy Sabins, MD;  Location: [facility];  Service: Endoscopy;  Laterality: N/A;  possible balloon  . Balloon dilation N/A 02/09/2013    Procedure: BALLOON DILATION;  Surgeon: Missy Sabins, MD;  Location: [facility];  Service: Endoscopy;  Laterality: N/A;    ROS:  General: Negative for fever, chills, fatigue, weakness. See history of present  illness ENT: Negative for hoarseness, difficulty swallowing , nasal congestion. CV: Negative for chest pain, angina, palpitations, dyspnea on exertion, peripheral edema.  Respiratory: Negative for dyspnea at rest, dyspnea on exertion, cough, sputum, wheezing.  GI: See history of present illness. GU:  Negative for dysuria, hematuria, urinary incontinence, urinary frequency, nocturnal urination.  Endo: See history of present illness   Physical Examination:   BP 97/59  Pulse 65  Temp(Src) 98 F (36.7 C) (Oral)  Ht 5\' 6"  (1.676 m)  Wt 129 lb 9.6 oz (58.786 kg)  BMI 20.93 kg/m2  LMP 01/19/1997  General: Well-nourished, well-developed in no acute distress.  Eyes: No icterus. Mouth: Oropharyngeal mucosa moist and pink , no lesions erythema or exudate. Lungs: Clear to  auscultation bilaterally.  Heart: Regular rate and rhythm, no murmurs rubs or gallops.  Abdomen: Bowel sounds are normal, mild to moderate epigastric tenderness, nondistended, no hepatosplenomegaly or masses, no abdominal bruits or hernia , no rebound or guarding.   Extremities: No lower extremity edema. No clubbing or deformities. Neuro: Alert and oriented x 4   Skin: Warm and dry, no jaundice.   Psych: Alert and cooperative, normal mood and affect.  Labs:  Lab Results  Component Value Date   TSH 0.533 08/24/2013   Lab Results  Component Value Date   WBC 11.2* 06/28/2013   HGB 12.6 06/28/2013   HCT 37.1 06/28/2013   MCV 89.8 06/28/2013   PLT 189 06/28/2013   Lab Results  Component Value Date   CREATININE 0.94 06/28/2013   BUN 18 06/28/2013   NA 143 06/28/2013   K 4.1 06/28/2013   CL 103 06/28/2013   CO2 24 06/28/2013   Lab Results  Component Value Date   ALT 11 06/28/2013   AST 17 06/28/2013   ALKPHOS 88 06/28/2013   BILITOT 0.3 06/28/2013    Imaging Studies: No results found.

## 2013-10-19 NOTE — Patient Instructions (Signed)
1. Stop Linzess and Dexilant. 2. Start pantoprazole 40mg  twice a day before a meal. 3. Carafate, 2 teaspoons four times per day. 4. Collect stool and drop off at lab. 5. Keep appointment with Dr. Derrill Kay.

## 2013-10-19 NOTE — Assessment & Plan Note (Signed)
Several month history of diarrhea. Stop Linzess. Check stool for C.Diff, culture, Giardia.

## 2013-10-19 NOTE — Progress Notes (Signed)
cc'ed to pcp °

## 2013-10-19 NOTE — Assessment & Plan Note (Signed)
H/O pylorospasm with GOO requiring intermittent Botox injection. Last one in 01/2013. Did not provide as long of relief as before. Suspect current symptoms due to the same. She has appointment with Dr. Derrill Kay in 11/2013. To discuss with Dr. Gala Romney regarding any additional input at this time.   She has refractory GERD as well. Switch from Dexilant to pantoprazole 40mg  BID. Add carafate QID. Continue Zofran prn.

## 2013-10-23 DIAGNOSIS — R197 Diarrhea, unspecified: Secondary | ICD-10-CM | POA: Diagnosis not present

## 2013-10-24 LAB — GIARDIA/CRYPTOSPORIDIUM (EIA)
CRYPTOSPORIDIUM SCREEN (EIA) (SOL): NEGATIVE
Giardia Screen (EIA): NEGATIVE

## 2013-10-24 LAB — CLOSTRIDIUM DIFFICILE BY PCR: Toxigenic C. Difficile by PCR: NOT DETECTED

## 2013-10-25 NOTE — Progress Notes (Signed)
Quick Note:  Await final stool culture. ______ 

## 2013-10-27 DIAGNOSIS — M25552 Pain in left hip: Secondary | ICD-10-CM | POA: Diagnosis not present

## 2013-10-27 DIAGNOSIS — Z720 Tobacco use: Secondary | ICD-10-CM | POA: Diagnosis not present

## 2013-10-27 DIAGNOSIS — M25551 Pain in right hip: Secondary | ICD-10-CM | POA: Diagnosis not present

## 2013-10-27 LAB — STOOL CULTURE

## 2013-10-31 NOTE — Progress Notes (Signed)
Quick Note:  Stools are negative. As far as her UGI symptoms, she needs to keep appointment with Dr. Derrill Kay as scheduled. Has the change in medications helped her symptoms any? ______

## 2013-11-06 ENCOUNTER — Encounter (HOSPITAL_COMMUNITY): Payer: Self-pay | Admitting: Emergency Medicine

## 2013-11-06 ENCOUNTER — Emergency Department (HOSPITAL_COMMUNITY): Payer: Medicare Other

## 2013-11-06 ENCOUNTER — Emergency Department (HOSPITAL_COMMUNITY)
Admission: EM | Admit: 2013-11-06 | Discharge: 2013-11-06 | Disposition: A | Discharge status: Home / Self Care | Payer: Medicare Other | Attending: Emergency Medicine | Admitting: Emergency Medicine

## 2013-11-06 DIAGNOSIS — Z9089 Acquired absence of other organs: Secondary | ICD-10-CM | POA: Insufficient documentation

## 2013-11-06 DIAGNOSIS — Z720 Tobacco use: Secondary | ICD-10-CM | POA: Diagnosis not present

## 2013-11-06 DIAGNOSIS — R1013 Epigastric pain: Secondary | ICD-10-CM | POA: Diagnosis not present

## 2013-11-06 DIAGNOSIS — R109 Unspecified abdominal pain: Secondary | ICD-10-CM | POA: Diagnosis not present

## 2013-11-06 DIAGNOSIS — K311 Adult hypertrophic pyloric stenosis: Secondary | ICD-10-CM | POA: Diagnosis not present

## 2013-11-06 DIAGNOSIS — F329 Major depressive disorder, single episode, unspecified: Secondary | ICD-10-CM | POA: Insufficient documentation

## 2013-11-06 DIAGNOSIS — K279 Peptic ulcer, site unspecified, unspecified as acute or chronic, without hemorrhage or perforation: Secondary | ICD-10-CM | POA: Diagnosis not present

## 2013-11-06 DIAGNOSIS — Z8739 Personal history of other diseases of the musculoskeletal system and connective tissue: Secondary | ICD-10-CM | POA: Insufficient documentation

## 2013-11-06 DIAGNOSIS — R1012 Left upper quadrant pain: Secondary | ICD-10-CM | POA: Insufficient documentation

## 2013-11-06 DIAGNOSIS — G8929 Other chronic pain: Secondary | ICD-10-CM | POA: Diagnosis not present

## 2013-11-06 DIAGNOSIS — E559 Vitamin D deficiency, unspecified: Secondary | ICD-10-CM | POA: Insufficient documentation

## 2013-11-06 DIAGNOSIS — Z880 Allergy status to penicillin: Secondary | ICD-10-CM | POA: Diagnosis not present

## 2013-11-06 DIAGNOSIS — Z8669 Personal history of other diseases of the nervous system and sense organs: Secondary | ICD-10-CM | POA: Insufficient documentation

## 2013-11-06 DIAGNOSIS — Z7951 Long term (current) use of inhaled steroids: Secondary | ICD-10-CM | POA: Insufficient documentation

## 2013-11-06 DIAGNOSIS — J441 Chronic obstructive pulmonary disease with (acute) exacerbation: Secondary | ICD-10-CM | POA: Insufficient documentation

## 2013-11-06 DIAGNOSIS — R1110 Vomiting, unspecified: Secondary | ICD-10-CM | POA: Diagnosis not present

## 2013-11-06 DIAGNOSIS — R197 Diarrhea, unspecified: Secondary | ICD-10-CM

## 2013-11-06 DIAGNOSIS — R112 Nausea with vomiting, unspecified: Secondary | ICD-10-CM | POA: Insufficient documentation

## 2013-11-06 DIAGNOSIS — Z3202 Encounter for pregnancy test, result negative: Secondary | ICD-10-CM | POA: Insufficient documentation

## 2013-11-06 DIAGNOSIS — Z79899 Other long term (current) drug therapy: Secondary | ICD-10-CM | POA: Insufficient documentation

## 2013-11-06 DIAGNOSIS — Z91040 Latex allergy status: Secondary | ICD-10-CM | POA: Diagnosis not present

## 2013-11-06 LAB — COMPREHENSIVE METABOLIC PANEL
ALT: 11 U/L (ref 0–35)
ANION GAP: 19 — AB (ref 5–15)
AST: 19 U/L (ref 0–37)
Albumin: 4.8 g/dL (ref 3.5–5.2)
Alkaline Phosphatase: 81 U/L (ref 39–117)
BUN: 19 mg/dL (ref 6–23)
CALCIUM: 10.3 mg/dL (ref 8.4–10.5)
CO2: 23 meq/L (ref 19–32)
CREATININE: 0.73 mg/dL (ref 0.50–1.10)
Chloride: 99 mEq/L (ref 96–112)
GFR calc Af Amer: >90 mL/min (ref >90)
Glucose, Bld: 148 mg/dL — ABNORMAL HIGH (ref 70–99)
Potassium: 3.8 mEq/L (ref 3.7–5.3)
Sodium: 141 mEq/L (ref 137–147)
Total Bilirubin: 0.6 mg/dL (ref 0.3–1.2)
Total Protein: 8.1 g/dL (ref 6.0–8.3)

## 2013-11-06 LAB — CBC WITH DIFFERENTIAL/PLATELET
BASOS ABS: 0 10*3/uL (ref 0.0–0.1)
Basophils Relative: 0 % (ref 0–1)
EOS PCT: 0 % (ref 0–5)
Eosinophils Absolute: 0 10*3/uL (ref 0.0–0.7)
HCT: 35.7 % — ABNORMAL LOW (ref 36.0–46.0)
Hemoglobin: 11.9 g/dL — ABNORMAL LOW (ref 12.0–15.0)
Lymphocytes Relative: 5 % — ABNORMAL LOW (ref 12–46)
Lymphs Abs: 0.9 10*3/uL (ref 0.7–4.0)
MCH: 30.6 pg (ref 26.0–34.0)
MCHC: 33.3 g/dL (ref 30.0–36.0)
MCV: 91.8 fL (ref 78.0–100.0)
Monocytes Absolute: 1 10*3/uL (ref 0.1–1.0)
Monocytes Relative: 6 % (ref 3–12)
Neutro Abs: 15.7 10*3/uL — ABNORMAL HIGH (ref 1.7–7.7)
Neutrophils Relative %: 89 % — ABNORMAL HIGH (ref 43–77)
Platelets: 169 10*3/uL (ref 150–400)
RBC: 3.89 MIL/uL (ref 3.87–5.11)
RDW: 13.6 % (ref 11.5–15.5)
WBC: 17.6 10*3/uL — ABNORMAL HIGH (ref 4.0–10.5)

## 2013-11-06 LAB — POC URINE PREG, ED: Preg Test, Ur: NEGATIVE

## 2013-11-06 LAB — LIPASE, BLOOD: LIPASE: 15 U/L (ref 11–59)

## 2013-11-06 LAB — URINE MICROSCOPIC-ADD ON

## 2013-11-06 LAB — URINALYSIS, ROUTINE W REFLEX MICROSCOPIC
Bilirubin Urine: NEGATIVE
Glucose, UA: NEGATIVE mg/dL
Hgb urine dipstick: NEGATIVE
KETONES UR: 15 mg/dL — AB
Leukocytes, UA: NEGATIVE
NITRITE: NEGATIVE
PH: 8.5 — AB (ref 5.0–8.0)
Protein, ur: 30 mg/dL — AB
Specific Gravity, Urine: 1.024 (ref 1.005–1.030)
UROBILINOGEN UA: 0.2 mg/dL (ref 0.0–1.0)

## 2013-11-06 MED ORDER — ONDANSETRON HCL 4 MG/2ML IJ SOLN
4.0000 mg | Freq: Once | INTRAMUSCULAR | Status: AC
  Administered 2013-11-06: 4 mg via INTRAVENOUS
  Filled 2013-11-06: qty 2

## 2013-11-06 MED ORDER — MORPHINE SULFATE 4 MG/ML IJ SOLN
4.0000 mg | Freq: Once | INTRAMUSCULAR | Status: AC
  Administered 2013-11-06: 4 mg via INTRAVENOUS
  Filled 2013-11-06: qty 1

## 2013-11-06 MED ORDER — METOCLOPRAMIDE HCL 5 MG/ML IJ SOLN
10.0000 mg | Freq: Once | INTRAMUSCULAR | Status: AC
  Administered 2013-11-06: 10 mg via INTRAVENOUS
  Filled 2013-11-06: qty 2

## 2013-11-06 MED ORDER — SODIUM CHLORIDE 0.9 % IV BOLUS (SEPSIS)
1000.0000 mL | Freq: Once | INTRAVENOUS | Status: AC
  Administered 2013-11-06: 1000 mL via INTRAVENOUS

## 2013-11-06 MED ORDER — METOCLOPRAMIDE HCL 10 MG PO TABS: 10.0000 mg | ORAL_TABLET | Freq: Four times a day (QID) | ORAL | Status: DC | PRN

## 2013-11-06 MED ORDER — HYDROMORPHONE HCL 1 MG/ML IJ SOLN
1.0000 mg | INTRAMUSCULAR | Status: AC
  Administered 2013-11-06: 1 mg via INTRAVENOUS
  Filled 2013-11-06: qty 1

## 2013-11-06 MED ORDER — OXYCODONE-ACETAMINOPHEN 5-325 MG PO TABS: 1.0000 | ORAL_TABLET | ORAL | Status: DC | PRN

## 2013-11-06 NOTE — ED Notes (Signed)
Pt states severe abd pain that started around 1230am last night; pt states she has had uncontrollable nausea, vomiting, and diarrhea. Pt states she has had one episode like this before when she had "botox put in her stomach".

## 2013-11-06 NOTE — Discharge Instructions (Signed)
Read the information below.  Use the prescribed medication as directed.  Please discuss all new medications with your pharmacist.  Do not take additional tylenol while taking the prescribed pain medication to avoid overdose.  You may return to the Emergency Department at any time for worsening condition or any new symptoms that concern you.  If you develop high fevers, worsening abdominal pain, uncontrolled vomiting, or are unable to tolerate fluids by mouth, return to the ER for a recheck.   ° °Abdominal Pain °Many things can cause abdominal pain. Usually, abdominal pain is not caused by a disease and will improve without treatment. It can often be observed and treated at home. Your health care provider will do a physical exam and possibly order blood tests and X-rays to help determine the seriousness of your pain. However, in many cases, more time must pass before a clear cause of the pain can be found. Before that point, your health care provider may not know if you need more testing or further treatment. °HOME CARE INSTRUCTIONS  °Monitor your abdominal pain for any changes. The following actions may help to alleviate any discomfort you are experiencing: °· Only take over-the-counter or prescription medicines as directed by your health care provider. °· Do not take laxatives unless directed to do so by your health care provider. °· Try a clear liquid diet (broth, tea, or water) as directed by your health care provider. Slowly move to a bland diet as tolerated. °SEEK MEDICAL CARE IF: °· You have unexplained abdominal pain. °· You have abdominal pain associated with nausea or diarrhea. °· You have pain when you urinate or have a bowel movement. °· You experience abdominal pain that wakes you in the night. °· You have abdominal pain that is worsened or improved by eating food. °· You have abdominal pain that is worsened with eating fatty foods. °· You have a fever. °SEEK IMMEDIATE MEDICAL CARE IF:  °· Your pain does  not go away within 2 hours. °· You keep throwing up (vomiting). °· Your pain is felt only in portions of the abdomen, such as the right side or the left lower portion of the abdomen. °· You pass bloody or black tarry stools. °MAKE SURE YOU: °· Understand these instructions.   °· Will watch your condition.   °· Will get help right away if you are not doing well or get worse.   °Document Released: 10/15/2004 Document Revised: 01/10/2013 Document Reviewed: 09/14/2012 °Exit[employer]® Patient Information ©2015 [employer]. This information is not intended to replace advice given to you by your health care provider. Make sure you discuss any questions you have with your health care provider. ° °

## 2013-11-06 NOTE — ED Notes (Signed)
Gave pt apple juice for PO challenge

## 2013-11-06 NOTE — ED Provider Notes (Signed)
CSN: [id number]     Arrival date & time 11/06/13  2025 History   First MD Initiated Contact with Patient 11/06/13 0700     Chief Complaint  Patient presents with  . Abdominal Pain     (Consider location/radiation/quality/duration/timing/severity/associated sxs/prior Treatment) The history is provided by the patient and the spouse.    Pt with hx gastric outlet obstruction, GERD, gastroparesis, pyloric spasm, PUD, chronic abdominal pain, recurrent N/V, presents with abdominal pain, N/V/D that began overnight.  Pt c/o severe pain in her upper abdomen.  Emesis and diarrhea have been profuse, nonbloody.  S/p remote cholecystectomy.  Per significant other, pt has episode like this ever 6 months.  States this is exactly like prior episodes of flare up of chronic GI problems.   Denies urinary or vaginal symptoms.  Did develop chest soreness after multiple episodes of vomiting.  Denies fevers.   Past Medical History  Diagnosis Date  . Migraines   . Osteoporosis   . Seasonal allergies   . Sinusitis   . Back pain, chronic   . Nicotine addiction   . Constipation   . Vitamin D deficiency   . Gastritis   . Gastroparesis   . Pyloric spasm 03/30/2011  . Gastric outlet obstruction   . COPD (chronic obstructive pulmonary disease)   . Asthma   . Substance abuse 2008    marijuana  . Peptic ulcer disease   . Depression 2000    h/o suicidal ideation  . Anemia of other chronic disease 11/29/2012  . Chronic abdominal pain   . Nausea and vomiting     recurrent   Past Surgical History  Procedure Laterality Date  . Cholecystectomy      ?2002  . Laser surgery on cervix    . Carpal tunnel release      left hand  . Esophagogastroduodenoscopy  12/25/2010    Dorothyann Peng, MD;  moderate gastritis, ?goo secondary to pylorspasm. BX showed reactive gstropathy no h.pyori, SB mucosa with intramucosal lymphocytosis and partial villous blunting (TTG 4.0 normal)  . Esophageal dilation  12/25/2010   Procedure: ESOPHAGEAL DILATION;  Surgeon: Dorothyann Peng, MD;  [facility];  Service: Endoscopy;;  . Esophagogastroduodenoscopy N/A 11/14/2012    [facility]:[id number] hiatal hernia. Abnormal gastric mucosa of  uncertain significance-status post biopsy. Subjectively, patient may have recurrent symptomatic, pylorospasm.  . Esophagogastroduodenoscopy (egd) with propofol N/A 02/09/2013    elongated stomach, partial lower esophageal ring widely patent. No obvious pyloric stenosis s/p Botox  . Botox injection N/A 02/09/2013    Procedure: BOTOX INJECTION;  Surgeon: Missy Sabins, MD;  Location[facility];  Service: Endoscopy;  Laterality: N/A;  possible balloon  . Balloon dilation N/A 02/09/2013    Procedure: BALLOON DILATION;  Surgeon: Missy Sabins, MD;  Location: [facility];  Service: Endoscopy;  Laterality: N/A;   Family History  Problem Relation Age of Onset  . Diabetes Father   . Liver disease Father     liver transplant at [facility], age 15  . Colon cancer Neg Hx   . Lung cancer Mother   . Heart disease Maternal Grandmother   . Parkinson's disease Maternal Grandfather    History  Substance Use Topics  . Smoking status: Current Every Day Smoker -- 0.50 packs/day for 15 years    Types: Cigarettes  . Smokeless tobacco: Never Used     Comment: quit 2011  . Alcohol Use: No     Comment: none since July. Periodic heavy drinker  for months at a time.   OB History   Grav Para Term Preterm Abortions TAB SAB Ect Mult Living                 Review of Systems  All other systems reviewed and are negative.     Allergies  Latex and Penicillins  Home Medications   Prior to Admission medications   Medication Sig Start Date End Date Taking? Authorizing Provider  albuterol (PROVENTIL HFA;VENTOLIN HFA) 108 (90 BASE) MCG/ACT inhaler Inhale 2 puffs into the lungs every 6 (six) hours as needed. Shortness of breath 11/14/12 05/27/15  [name], MD  budesonide-formoterol [facility]) 80-4.5  MCG/ACT inhaler Inhale 2 puffs into the lungs 2 (two) times daily. 11/14/12 05/27/15  [name], MD  calcium-vitamin D (OSCAL 500/200 D-3) 500-200 MG-UNIT per tablet Take 1 tablet by mouth 2 (two) times daily. 11/14/12 11/14/13  [name], MD  Cholecalciferol (VITAMIN D3) 2000 UNITS CHEW Chew 1 capsule by mouth daily.     Historical Provider, MD  clobetasol cream (TEMOVATE) 1.32 % Apply 1 application topically 2 (two) times daily. Apply to rash    Historical Provider, MD  DULoxetine (CYMBALTA) 30 MG capsule Take 1 capsule (30 mg total) by mouth daily. 03/02/12 09/12/14  [name], MD  ergocalciferol (VITAMIN D2) 50000 UNITS capsule Take 1 capsule (50,000 Units total) by mouth once a week. One capsule once weekly 11/14/12   [name], MD  estradiol (ESTRACE VAGINAL) 0.1 MG/GM vaginal cream Place 2 g vaginally daily. As directed    Historical Provider, MD  feeding supplement (ENSURE CLINICAL STRENGTH) LIQD Take 237 mLs by mouth 3 (three) times daily with meals. 01/01/11   Nishant Dhungel, MD  ferrous sulfate 325 (65 FE) MG tablet Take 325 mg by mouth daily with breakfast.    Historical Provider, MD  fluticasone (CUTIVATE) 0.05 % cream Apply 1 application topically 2 (two) times daily.    Historical Provider, MD  fluticasone (FLONASE) 50 MCG/ACT nasal spray Place 2 sprays into both nostrils daily.    Historical Provider, MD  HYDROcodone-acetaminophen (NORCO) 7.5-325 MG per tablet Take 1 tablet by mouth every 4 (four) hours as needed.  10/01/13   Historical Provider, MD  loratadine (CLARITIN) 10 MG tablet Take 1 tablet (10 mg total) by mouth daily. 10/09/11   [name], MD  minoxidil (MINOXIDIL FOR WOMEN) 2 % external solution Apply topically 2 (two) times daily. 08/01/13   [name], MD  Multiple Vitamin (MULTIVITAMIN WITH MINERALS) TABS tablet Take 1 tablet by mouth daily.    Historical Provider, MD  ondansetron (ZOFRAN) 4 MG tablet Take 1 tablet (4 mg  total) by mouth every 8 (eight) hours as needed for nausea or vomiting. 09/19/13   Orvil Feil, NP  pantoprazole (PROTONIX) 40 MG tablet Take 1 tablet (40 mg total) by mouth 2 (two) times daily before a meal. 10/19/13   Mahala Menghini, PA-C  simvastatin (ZOCOR) 20 MG tablet Take 20 mg by mouth daily.    Historical Provider, MD  sucralfate (CARAFATE) 1 GM/10ML suspension Take 10 mLs (1 g total) by mouth 4 (four) times daily -  with meals and at bedtime. 10/19/13   Mahala Menghini, PA-C   BP 100/74  Pulse 67  Temp(Src) 97 F (36.1 C) (Axillary)  Resp 18  Ht 5\' 6"  (1.676 m)  Wt 132 lb (59.875 kg)  BMI 21.32 kg/m2  SpO2 100%  LMP 01/19/1997 Physical Exam  Nursing note and vitals reviewed. Constitutional: She appears well-developed and well-nourished. No distress.  Uncomfortable appearing, writhing around on stretcher.   HENT:  Head: Normocephalic and atraumatic.  Neck: Neck supple.  Cardiovascular: Normal rate and regular rhythm.   Pulmonary/Chest: Effort normal. No respiratory distress. She has wheezes (mild expiratory). She has no rales.  Abdominal: Soft. She exhibits no distension. There is tenderness in the epigastric area and left upper quadrant. There is no rebound and no guarding.  Neurological: She is alert.  Skin: She is not diaphoretic.    ED Course  Procedures (including critical care time) Labs Review Labs Reviewed  CBC WITH DIFFERENTIAL - Abnormal; Notable for the following:    WBC 17.6 (*)    Hemoglobin 11.9 (*)    HCT 35.7 (*)    Neutrophils Relative % 89 (*)    Neutro Abs 15.7 (*)    Lymphocytes Relative 5 (*)    All other components within normal limits  COMPREHENSIVE METABOLIC PANEL - Abnormal; Notable for the following:    Glucose, Bld 148 (*)    Anion gap 19 (*)    All other components within normal limits  URINALYSIS, ROUTINE W REFLEX MICROSCOPIC - Abnormal; Notable for the following:    pH 8.5 (*)    Ketones, ur 15 (*)    Protein, ur 30 (*)    All other  components within normal limits  URINE MICROSCOPIC-ADD ON - Abnormal; Notable for the following:    Bacteria, UA FEW (*)    All other components within normal limits  LIPASE, BLOOD  POC URINE PREG, ED    Imaging Review Dg Abd Acute W/chest  11/06/2013   CLINICAL DATA:  Mid abdominal pain, weakness, vomiting, personal history of COPD, asthma, gastric outlet obstruction, smoker  EXAM: ACUTE ABDOMEN SERIES (ABDOMEN 2 VIEW & CHEST 1 VIEW)  COMPARISON:  Chest radiograph 02/08/2013, abdominal radiograph 03/30/2011  FINDINGS: Normal heart size, mediastinal contours, and pulmonary vascularity.  Lungs clear.  No pleural effusion or pneumothorax.  Surgical clips RIGHT upper quadrant consistent with history of cholecystectomy.  Normal bowel gas pattern.  No bowel dilatation, bowel wall thickening or free intraperitoneal air.  Levoconvex thoracolumbar scoliosis.  No acute osseous findings or urinary tract calcification.  IMPRESSION: No acute abnormalities.   Electronically Signed   By: Lavonia Dana M.D.   On: 11/06/2013 08:00     EKG Interpretation None      8:05 AM Reexamination of abdomen after pain medication: soft, nondistended, NABS, TTP epigastric, LUQ.  NO tenderness of lower abdomen.  No guarding, no rebound.  Pt resting comfortably.   11:32 AM Tolerating PO.  Prefers discharge home with medication changes as discussed.    MDM   Final diagnoses:  Nausea vomiting and diarrhea  Epigastric pain    Afebrile, nontoxic patient with abdominal pain, N/V/D.  Pt with chronic GI issues including chronic pain, gastroparesis, chronic diarrhea,  PUD, pyloric spasm.  States this feels like an exacerbation of the same.  Pain and nausea controlled in ED.  Tolerating PO.  Pt on hydrocodone and zofran at home, have changed pain medication to percocet and nausea medication to reglan.  Pt has close GI follow up in [place]. D/C home.  Discussed result, findings, treatment, and follow up  with patient.  Pt given  return precautions.  Pt verbalizes understanding and agrees with plan.         Clayton Bibles, PA-C 11/06/13 1442

## 2013-11-06 NOTE — ED Notes (Signed)
Pt back from x-ray.

## 2013-11-06 NOTE — ED Provider Notes (Signed)
History/physical exam/procedure(s) were performed by non-physician practitioner and as supervising physician I was immediately available for consultation/collaboration. I have reviewed all notes and am in agreement with care and plan.   Shaune Pollack, MD 11/06/13 [phone]

## 2013-11-22 DIAGNOSIS — K219 Gastro-esophageal reflux disease without esophagitis: Secondary | ICD-10-CM | POA: Diagnosis not present

## 2013-11-22 DIAGNOSIS — R1013 Epigastric pain: Secondary | ICD-10-CM | POA: Diagnosis not present

## 2013-11-22 DIAGNOSIS — R1110 Vomiting, unspecified: Secondary | ICD-10-CM | POA: Diagnosis not present

## 2013-11-23 ENCOUNTER — Telehealth: Payer: Self-pay | Admitting: Internal Medicine

## 2013-11-23 DIAGNOSIS — M25551 Pain in right hip: Secondary | ICD-10-CM | POA: Diagnosis not present

## 2013-11-23 DIAGNOSIS — Z720 Tobacco use: Secondary | ICD-10-CM | POA: Diagnosis not present

## 2013-11-23 NOTE — Telephone Encounter (Signed)
PATIENT STATES THAT DR. KEELING WILL NOT CHANGE PAIN MEDS BECAUSE OF STOMACH PROBLEMS AND PATIENT INQUIRING ON GETTING PAIN MEDICINE FROM DR Gala Romney.  PLEASE ADVISE PATIENT [phone]

## 2013-11-28 NOTE — Telephone Encounter (Signed)
Tried to call pt- LMOM 

## 2013-11-28 NOTE — Telephone Encounter (Signed)
I spoke with the pt- pt taking hydrocodone for a sprained ankle and a painful R hip. she went to the ED on 11/06/13 with nausea and vomiting, they changed her pain medication from hydrocodone to oxycodone and told her to follow up with Dr.Keeling to further refills. Pt said they felt like the hydrocodone was what was making her sick. Pt called Dr.Keeling and was told he couldn't change it and she should call her pcp. Pt said she called Dr.Simpson's office and was told to call us. Pt said she recently went to [facility] and saw Dr.Koch and he wouldn't change her medication either. Pt wanted me to ask LSL if we could change it. I printed the last ov note from [facility] and its on [place] desk.

## 2013-11-29 NOTE — Telephone Encounter (Signed)
My impression of ED note was a change to oxycodone to help with management of her pain. I would not expect oxycodone to be less benign on her stomach issues than hydrocodone. Actually likely the opposite effect.   She is in the middle of work-up with Dr. Derrill Kay at [facility]. GES and electrogastrogram study planned in few weeks. He also referred her to a pain clinic.   I cannot make this change in her pain medications based on the above stated reasons.   We will be glad to continue management of her GI issues but would recommend her complete her current work up at [facility].

## 2013-12-01 ENCOUNTER — Encounter (HOSPITAL_COMMUNITY): Payer: Medicare Other | Attending: Hematology and Oncology

## 2013-12-01 DIAGNOSIS — D649 Anemia, unspecified: Secondary | ICD-10-CM

## 2013-12-01 DIAGNOSIS — D638 Anemia in other chronic diseases classified elsewhere: Secondary | ICD-10-CM | POA: Insufficient documentation

## 2013-12-01 LAB — CBC WITH DIFFERENTIAL/PLATELET
Basophils Absolute: 0 10*3/uL (ref 0.0–0.1)
Basophils Relative: 0 % (ref 0–1)
EOS ABS: 0.1 10*3/uL (ref 0.0–0.7)
Eosinophils Relative: 1 % (ref 0–5)
HEMATOCRIT: 39.4 % (ref 36.0–46.0)
Hemoglobin: 13 g/dL (ref 12.0–15.0)
LYMPHS ABS: 2.2 10*3/uL (ref 0.7–4.0)
LYMPHS PCT: 24 % (ref 12–46)
MCH: 30.7 pg (ref 26.0–34.0)
MCHC: 33 g/dL (ref 30.0–36.0)
MCV: 93.1 fL (ref 78.0–100.0)
MONO ABS: 0.5 10*3/uL (ref 0.1–1.0)
MONOS PCT: 6 % (ref 3–12)
Neutro Abs: 6.3 10*3/uL (ref 1.7–7.7)
Neutrophils Relative %: 69 % (ref 43–77)
Platelets: 180 10*3/uL (ref 150–400)
RBC: 4.23 MIL/uL (ref 3.87–5.11)
RDW: 13.9 % (ref 11.5–15.5)
WBC: 9.2 10*3/uL (ref 4.0–10.5)

## 2013-12-01 LAB — IRON AND TIBC
Iron: 44 ug/dL (ref 42–135)
SATURATION RATIOS: 13 % — AB (ref 20–55)
TIBC: 336 ug/dL (ref 250–470)
UIBC: 292 ug/dL (ref 125–400)

## 2013-12-01 LAB — FERRITIN: FERRITIN: 75 ng/mL (ref 10–291)

## 2013-12-01 NOTE — Telephone Encounter (Signed)
Pt is aware.  

## 2013-12-01 NOTE — Progress Notes (Signed)
Lab draw

## 2013-12-04 LAB — ERYTHROPOIETIN: Erythropoietin: 9.5 m[IU]/mL (ref 2.6–18.5)

## 2013-12-05 ENCOUNTER — Encounter (HOSPITAL_BASED_OUTPATIENT_CLINIC_OR_DEPARTMENT_OTHER): Payer: Medicare Other

## 2013-12-05 ENCOUNTER — Encounter (HOSPITAL_COMMUNITY): Payer: Self-pay

## 2013-12-05 VITALS — BP 107/74 | HR 56 | Temp 98.0°F | Resp 15 | Wt 132.6 lb

## 2013-12-05 DIAGNOSIS — D649 Anemia, unspecified: Secondary | ICD-10-CM | POA: Diagnosis not present

## 2013-12-05 DIAGNOSIS — R109 Unspecified abdominal pain: Secondary | ICD-10-CM | POA: Diagnosis not present

## 2013-12-05 NOTE — Patient Instructions (Signed)
[facility] Discharge Instructions  RECOMMENDATIONS MADE BY THE CONSULTANT AND ANY TEST RESULTS WILL BE SENT TO YOUR REFERRING PHYSICIAN.  EXAM FINDINGS BY THE PHYSICIAN TODAY AND SIGNS OR SYMPTOMS TO REPORT TO CLINIC OR PRIMARY PHYSICIAN: Exam and findings as discussed by Dr. Barnet Glasgow.  Labs are stable.  Report increased fatigue or other symptoms  MEDICATIONS PRESCRIBED:  Continue your iron pill   INSTRUCTIONS/FOLLOW-UP: Follow-up in 1 year with labs and office visit.  Thank you for choosing [facility] to provide your oncology and hematology care.  To afford each patient quality time with our providers, please arrive at least 15 minutes before your scheduled appointment time.  With your help, our goal is to use those 15 minutes to complete the necessary work-up to ensure our physicians have the information they need to help with your evaluation and healthcare recommendations.    Effective January 1st, 2014, we ask that you re-schedule your appointment with our physicians should you arrive 10 or more minutes late for your appointment.  We strive to give you quality time with our providers, and arriving late affects you and other patients whose appointments are after yours.    Again, thank you for choosing [facility].  Our hope is that these requests will decrease the amount of time that you wait before being seen by our physicians.       _____________________________________________________________  Should you have questions after your visit to [facility], please contact our office at [phone] between the hours of 8:30 a.m. and 4:30 p.m.  Voicemails left after 4:30 p.m. will not be returned until the following business day.  For prescription refill requests, have your pharmacy contact our office with your prescription refill request.    _______________________________________________________________  We hope that we have  given you very good care.  You may receive a patient satisfaction survey in the mail, please complete it and return it as soon as possible.  We value your feedback!  _______________________________________________________________  Have you asked about our STAR program?  STAR stands for Survivorship Training and Rehabilitation, and this is a nationally recognized cancer care program that focuses on survivorship and rehabilitation.  Cancer and cancer treatments may cause problems, such as, pain, making you feel tired and keeping you from doing the things that you need or want to do. Cancer rehabilitation can help. Our goal is to reduce these troubling effects and help you have the best quality of life possible.  You may receive a survey from a nurse that asks questions about your current state of health.  Based on the survey results, all eligible patients will be referred to the [facility] program for an evaluation so we can better serve you!  A frequently asked questions sheet is available upon request.

## 2013-12-05 NOTE — Progress Notes (Signed)
Tracey Daniel  OFFICE PROGRESS NOTE  Tracey Nakayama, MD [address], [address] [place] [address]  DIAGNOSIS: Anemia, unspecified anemia type - Plan: tiZANidine (ZANAFLEX) 4 MG tablet, CBC with Differential, Ferritin  Chief Complaint  Patient presents with  . Anemia    CURRENT THERAPY: Oral ferrous sulfate.  INTERVAL HISTORY: Tracey Daniel 33 y.o. female returns for follow-up of mixed anemia with contributions from blood loss and iron malabsorption.  She continued a proton pump inhibitors, low dose ferrous sulfate, and Carafate. She does have abdominal wall pain when stretching relieved by anterior flexion. She did have gastric emptying studies done as well as electrogastrography in 2013 with negative findings. She's had significant hot flashes with occasional swelling of the right ankle. She denies a diarrhea, constipation, dysuria, hematuria, cough, wheezing, sore throat, skin rash, headache, or seizures.  MEDICAL HISTORY: Past Medical History  Diagnosis Date  . Migraines   . Osteoporosis   . Seasonal allergies   . Sinusitis   . Back pain, chronic   . Nicotine addiction   . Constipation   . Vitamin D deficiency   . Gastritis   . Gastroparesis   . Pyloric spasm 03/30/2011  . Gastric outlet obstruction   . COPD (chronic obstructive pulmonary disease)   . Asthma   . Substance abuse 2008    marijuana  . Peptic ulcer disease   . Depression 2000    h/o suicidal ideation  . Anemia of other chronic disease 11/29/2012  . Chronic abdominal pain   . Nausea and vomiting     recurrent    INTERIM HISTORY: has DEPRESSION; CONSTIPATION; AMENORRHEA, SECONDARY; ECZEMA; BACK PAIN, CHRONIC; RASH AND OTHER NONSPECIFIC SKIN ERUPTION; HEADACHE; CLOSED FRACTURE OF METATARSAL BONE; GERD (gastroesophageal reflux disease); Chronic obstructive asthma, unspecified; H. 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   [facility] note Dr. Scherrie November 11/22/2013 IMPRESSION AND PLAN: My impression is that Tracey Daniel's pain and nausea syndrome may be related to a positive Carnett's sign and an abdominal wall syndrome. When I had her perform this maneuver, her pain up to an 8/10 and nausea was associated with this. S  he may also have an element of gastroparesis, but three years ago her gastric emptying was normal. So I recommended that she see our Pain Clinic for evaluation and she agrees. I will also order a gastric emptying study and electrogastrogram study to further evaluate her stomach function. She agrees with this plan too. I will see her again after these tests are completed.  ALLERGIES:  is allergic to latex and penicillins.  MEDICATIONS: has a current medication list which includes the following prescription(s): albuterol, budesonide-formoterol, calcium-vitamin d, vitamin d3, clobetasol cream, duloxetine, ergocalciferol, estradiol, feeding supplement, ferrous sulfate, fluticasone, fluticasone, hydrocodone-acetaminophen, loratadine, metoclopramide, minoxidil, multivitamin with minerals, ondansetron, pantoprazole, simvastatin, tizanidine, metoclopramide, and sucralfate.  SURGICAL HISTORY:  Past Surgical History  Procedure Laterality Date  . Cholecystectomy      ?2002  . Laser surgery on cervix    . Carpal tunnel release      left  hand  . Esophagogastroduodenoscopy  12/25/2010    Dorothyann Peng, MD;  moderate  gastritis, ?goo secondary to pylorspasm. BX showed reactive gstropathy no h.pyori, SB mucosa with intramucosal lymphocytosis and partial villous blunting (TTG 4.0 normal)  . Esophageal dilation  12/25/2010    Procedure: ESOPHAGEAL DILATION;  Surgeon: Dorothyann Peng, MD;  Location: [facility];  Service: Endoscopy;;  . Esophagogastroduodenoscopy N/A 11/14/2012    WPY:KDXIP hiatal hernia. Abnormal gastric mucosa of  uncertain significance-status post biopsy. Subjectively, patient may have recurrent symptomatic, pylorospasm.  . Esophagogastroduodenoscopy (egd) with propofol N/A 02/09/2013    elongated stomach, partial lower esophageal ring widely patent. No obvious pyloric stenosis s/p Botox  . Botox injection N/A 02/09/2013    Procedure: BOTOX INJECTION;  Surgeon: Missy Sabins, MD;  Location: [facility];  Service: Endoscopy;  Laterality: N/A;  possible balloon  . Balloon dilation N/A 02/09/2013    Procedure: BALLOON DILATION;  Surgeon: Missy Sabins, MD;  Location: [facility];  Service: Endoscopy;  Laterality: N/A;    FAMILY HISTORY: family history includes Diabetes in her father; Heart disease in her maternal grandmother; Liver disease in her father; Lung cancer in her mother; Parkinson's disease in her maternal grandfather. There is no history of Colon cancer.  SOCIAL HISTORY:  reports that she has been smoking Cigarettes.  She has a 7.5 pack-year smoking history. She has never used smokeless tobacco. She reports that she does not drink alcohol or use illicit drugs.  REVIEW OF SYSTEMS:  Other than that discussed above is noncontributory.  PHYSICAL EXAMINATION: ECOG PERFORMANCE STATUS: 1 - Symptomatic but completely ambulatory  Blood pressure 107/74, pulse 56, temperature 98 F (36.7 C), temperature source Oral, resp. rate 15, weight 132 lb 9.6 oz (60.147 kg), last menstrual period 01/19/1997, SpO2 100 %.  GENERAL:alert, no distress and comfortable as long as she isn't in anterior flexion  posture. SKIN: skin color, texture, turgor are normal, no rashes or significant lesions EYES: PERLA; Conjunctiva are pink and non-injected, sclera clear SINUSES: No redness or tenderness over maxillary or ethmoid sinuses OROPHARYNX:no exudate, no erythema on lips, buccal mucosa, or tongue. NECK: supple, thyroid normal size, non-tender, without nodularity. No masses CHEST: normal AP diameter with no breast masses. LYMPH:  no palpable lymphadenopathy in the cervical, axillary or inguinal LUNGS: clear to auscultation and percussion with normal breathing effort HEART: regular rate & rhythm and no murmurs. ABDOMEN:abdomen soft, non-tender and normal bowel sounds. No paraspinal hepatomegaly, ascites, or CVA tenderness. MUSCULOSKELETAL:no cyanosis of digits and no clubbing. Range of motion normal.  NEURO: alert & oriented x 3 with fluent speech, no focal motor/sensory deficits   LABORATORY DATA: Lab on 12/01/2013  Component Date Value Ref Range Status  . WBC 12/01/2013 9.2  4.0 - 10.5 K/uL Final  . RBC 12/01/2013 4.23  3.87 - 5.11 MIL/uL Final  . Hemoglobin 12/01/2013 13.0  12.0 - 15.0 g/dL Final  . HCT 12/01/2013 39.4  36.0 - 46.0 % Final  . MCV 12/01/2013 93.1  78.0 - 100.0 fL Final  . MCH 12/01/2013 30.7  26.0 - 34.0 pg Final  . MCHC 12/01/2013 33.0  30.0 - 36.0 g/dL Final  . RDW 12/01/2013 13.9  11.5 - 15.5 % Final  . Platelets 12/01/2013 180  150 - 400 K/uL Final  . Neutrophils Relative % 12/01/2013 69  43 - 77 % Final  . Neutro Abs 12/01/2013 6.3  1.7 - 7.7 K/uL Final  . Lymphocytes Relative 12/01/2013 24  12 - 46 %  Final  . Lymphs Abs 12/01/2013 2.2  0.7 - 4.0 K/uL Final  . Monocytes Relative 12/01/2013 6  3 - 12 % Final  . Monocytes Absolute 12/01/2013 0.5  0.1 - 1.0 K/uL Final  . Eosinophils Relative 12/01/2013 1  0 - 5 % Final  . Eosinophils Absolute 12/01/2013 0.1  0.0 - 0.7 K/uL Final  . Basophils Relative 12/01/2013 0  0 - 1 % Final  . Basophils Absolute 12/01/2013 0.0  0.0  - 0.1 K/uL Final  . Erythropoietin 12/01/2013 9.5  2.6 - 18.5 mIU/mL Final   Performed at [facility]  . Iron 12/01/2013 44  42 - 135 ug/dL Final  . TIBC 12/01/2013 336  250 - 470 ug/dL Final  . Saturation Ratios 12/01/2013 13* 20 - 55 % Final  . UIBC 12/01/2013 292  125 - 400 ug/dL Final   Performed at [facility]  . Ferritin 12/01/2013 75  10 - 291 ng/mL Final   Performed at [facility]  Admission on 11/06/2013, Discharged on 11/06/2013  Component Date Value Ref Range Status  . WBC 11/06/2013 17.6* 4.0 - 10.5 K/uL Final  . RBC 11/06/2013 3.89  3.87 - 5.11 MIL/uL Final  . Hemoglobin 11/06/2013 11.9* 12.0 - 15.0 g/dL Final  . HCT 11/06/2013 35.7* 36.0 - 46.0 % Final  . MCV 11/06/2013 91.8  78.0 - 100.0 fL Final  . MCH 11/06/2013 30.6  26.0 - 34.0 pg Final  . MCHC 11/06/2013 33.3  30.0 - 36.0 g/dL Final  . RDW 11/06/2013 13.6  11.5 - 15.5 % Final  . Platelets 11/06/2013 169  150 - 400 K/uL Final  . Neutrophils Relative % 11/06/2013 89* 43 - 77 % Final  . Neutro Abs 11/06/2013 15.7* 1.7 - 7.7 K/uL Final  . Lymphocytes Relative 11/06/2013 5* 12 - 46 % Final  . Lymphs Abs 11/06/2013 0.9  0.7 - 4.0 K/uL Final  . Monocytes Relative 11/06/2013 6  3 - 12 % Final  . Monocytes Absolute 11/06/2013 1.0  0.1 - 1.0 K/uL Final  . Eosinophils Relative 11/06/2013 0  0 - 5 % Final  . Eosinophils Absolute 11/06/2013 0.0  0.0 - 0.7 K/uL Final  . Basophils Relative 11/06/2013 0  0 - 1 % Final  . Basophils Absolute 11/06/2013 0.0  0.0 - 0.1 K/uL Final  . Sodium 11/06/2013 141  137 - 147 mEq/L Final  . Potassium 11/06/2013 3.8  3.7 - 5.3 mEq/L Final  . Chloride 11/06/2013 99  96 - 112 mEq/L Final  . CO2 11/06/2013 23  19 - 32 mEq/L Final  . Glucose, Bld 11/06/2013 148* 70 - 99 mg/dL Final  . BUN 11/06/2013 19  6 - 23 mg/dL Final  . Creatinine, Ser 11/06/2013 0.73  0.50 - 1.10 mg/dL Final  . Calcium 11/06/2013 10.3  8.4 - 10.5 mg/dL Final  . Total Protein 11/06/2013 8.1  6.0  - 8.3 g/dL Final  . Albumin 11/06/2013 4.8  3.5 - 5.2 g/dL Final  . AST 11/06/2013 19  0 - 37 U/L Final  . ALT 11/06/2013 11  0 - 35 U/L Final  . Alkaline Phosphatase 11/06/2013 81  39 - 117 U/L Final  . Total Bilirubin 11/06/2013 0.6  0.3 - 1.2 mg/dL Final  . GFR calc non Af Amer 11/06/2013 >90  >90 mL/min Final  . GFR calc Af Amer 11/06/2013 >90  >90 mL/min Final   Comment: (NOTE)  The eGFR has been calculated using the CKD EPI equation.                          This calculation has not been validated in all clinical situations.                          eGFR's persistently <90 mL/min signify possible Chronic Kidney                          Disease.  . Anion gap 11/06/2013 19* 5 - 15 Final  . Lipase 11/06/2013 15  11 - 59 U/L Final  . Color, Urine 11/06/2013 YELLOW  YELLOW Final  . APPearance 11/06/2013 CLEAR  CLEAR Final  . Specific Gravity, Urine 11/06/2013 1.024  1.005 - 1.030 Final  . pH 11/06/2013 8.5* 5.0 - 8.0 Final  . Glucose, UA 11/06/2013 NEGATIVE  NEGATIVE mg/dL Final  . Hgb urine dipstick 11/06/2013 NEGATIVE  NEGATIVE Final  . Bilirubin Urine 11/06/2013 NEGATIVE  NEGATIVE Final  . Ketones, ur 11/06/2013 15* NEGATIVE mg/dL Final  . Protein, ur 11/06/2013 30* NEGATIVE mg/dL Final  . Urobilinogen, UA 11/06/2013 0.2  0.0 - 1.0 mg/dL Final  . Nitrite 11/06/2013 NEGATIVE  NEGATIVE Final  . Leukocytes, UA 11/06/2013 NEGATIVE  NEGATIVE Final  . Preg Test, Ur 11/06/2013 NEGATIVE  NEGATIVE Final   Comment:                                 THE SENSITIVITY OF THIS                          METHODOLOGY IS >24 mIU/mL  . Squamous Epithelial / LPF 11/06/2013 RARE  RARE Final  . WBC, UA 11/06/2013 0-2  <3 WBC/hpf Final  . Bacteria, UA 11/06/2013 FEW* RARE Final  . Urine-Other 11/06/2013 MUCOUS PRESENT   Final    PATHOLOGY:no new pathology.  Urinalysis    Component Value Date/Time   COLORURINE YELLOW 11/06/2013 [place] 11/06/2013 0849    LABSPEC 1.024 11/06/2013 0849   PHURINE 8.5* 11/06/2013 0849   GLUCOSEU NEGATIVE 11/06/2013 0849   HGBUR NEGATIVE 11/06/2013 0849   HGBUR trace-intact 11/21/2008 1448   BILIRUBINUR NEGATIVE 11/06/2013 0849   BILIRUBINUR negative 08/01/2013 1503   KETONESUR 15* 11/06/2013 0849   PROTEINUR 30* 11/06/2013 0849   PROTEINUR negative 08/01/2013 1503   UROBILINOGEN 0.2 11/06/2013 0849   UROBILINOGEN 0.2 08/01/2013 1503   NITRITE NEGATIVE 11/06/2013 0849   NITRITE negative 08/01/2013 1503   LEUKOCYTESUR NEGATIVE 11/06/2013 0849    RADIOGRAPHIC STUDIES: Dg Abd Acute W/chest  11/06/2013   CLINICAL DATA:  Mid abdominal pain, weakness, vomiting, personal history of COPD, asthma, gastric outlet obstruction, smoker  EXAM: ACUTE ABDOMEN SERIES (ABDOMEN 2 VIEW & CHEST 1 VIEW)  COMPARISON:  Chest radiograph 02/08/2013, abdominal radiograph 03/30/2011  FINDINGS: Normal heart size, mediastinal contours, and pulmonary vascularity.  Lungs clear.  No pleural effusion or pneumothorax.  Surgical clips RIGHT upper quadrant consistent with history of cholecystectomy.  Normal bowel gas pattern.  No bowel dilatation, bowel wall thickening or free intraperitoneal air.  Levoconvex thoracolumbar scoliosis.  No acute osseous findings or urinary tract calcification.  IMPRESSION: No acute abnormalities.   Electronically Signed   By: Crist Infante.D.  On: 11/06/2013 08:00    ASSESSMENT:  #1. Mixed anemia, stable. #2. 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 Being referred to pain clinic at [facility]. #3. Testosterone excess with premature menopause. #4. Vitamin D deficiency, on supplements.   PLAN:  #1. Follow through with gastric emptying study as well as electrogastrography. #2. Follow-up with pain clinic at [facility]. #3. Follow-up in his clinic in one year with CBC, chem profile, and ferritin. Continue ferrous sulfate 325 mg daily.   All questions were answered. The patient knows to call the clinic  with any problems, questions or concerns. We can certainly see the patient much sooner if necessary.   I spent 25 minutes counseling the patient face to face. The total time spent in the appointment was 30 minutes.    Doroteo Bradford, MD 12/05/2013 10:07 AM  DISCLAIMER:  This note was dictated with voice recognition software.  Similar sounding words can inadvertently be transcribed inaccurately and may not be corrected upon review.

## 2013-12-13 ENCOUNTER — Ambulatory Visit (INDEPENDENT_AMBULATORY_CARE_PROVIDER_SITE_OTHER): Payer: Medicare Other

## 2013-12-13 ENCOUNTER — Ambulatory Visit (INDEPENDENT_AMBULATORY_CARE_PROVIDER_SITE_OTHER): Payer: Medicare Other | Admitting: Family Medicine

## 2013-12-13 ENCOUNTER — Encounter: Payer: Self-pay | Admitting: Family Medicine

## 2013-12-13 VITALS — BP 118/80 | HR 63 | Resp 16 | Ht 66.0 in | Wt 130.1 lb

## 2013-12-13 DIAGNOSIS — E559 Vitamin D deficiency, unspecified: Secondary | ICD-10-CM

## 2013-12-13 DIAGNOSIS — R5383 Other fatigue: Secondary | ICD-10-CM

## 2013-12-13 DIAGNOSIS — Z23 Encounter for immunization: Secondary | ICD-10-CM

## 2013-12-13 DIAGNOSIS — Z0000 Encounter for general adult medical examination without abnormal findings: Secondary | ICD-10-CM | POA: Diagnosis not present

## 2013-12-13 NOTE — Patient Instructions (Signed)
F/u in 4.5 month, call if you need me before  Fasting chem 7 , cbc and vit D in 4.5 month  Work on smoking cessation, now at 4 per day, quit date is January 19, 2014  [place] [facility] THERAPIST .Marland Kitchen REVIEW PAIN MEDS AND MUSCLE RELAXERS WITH dR KEELING SINCER YOU REPORT FEELING SLEEPY/DIZZY AT TIMES

## 2013-12-13 NOTE — Assessment & Plan Note (Signed)

## 2013-12-13 NOTE — Progress Notes (Signed)
Subjective:    Patient ID: Tracey Daniel, female    DOB: [birth year], 33 y.o.   MRN: [id number]  HPI Preventive Screening-Counseling & Management   Patient present here today for a Medicare annual wellness visit.   Current Problems (verified)   Medications Prior to Visit Allergies (verified)   PAST HISTORY  Family History (verified)   Social History Not married, no children, currently disabled    Risk Factors  Current exercise habits: walks a few days a week and does stretches at home   Dietary issues discussed: heart healthy discussed, encouraged to eat more fruits and vegetables, doesn;t eat much fried food.    Cardiac risk factors: none significant  Depression Screen  (Note: if answer to either of the following is "Yes", a more complete depression screening is indicated)   Over the past two weeks, have you felt down, depressed or hopeless? Once in awhile  Over the past two weeks, have you felt little interest or pleasure in doing things? No  Have you lost interest or pleasure in daily life? No  Do you often feel hopeless? No  Do you cry easily over simple problems? Yes   Activities of Daily Living  In your present state of health, do you have any difficulty performing the following activities?  Driving?: No Managing money?: No Feeding yourself?:No Getting from bed to chair?:No Climbing a flight of stairs?:No Preparing food and eating?:No Bathing or showering?:No Getting dressed?:No Getting to the toilet?:No Using the toilet?:No Moving around from place to place?: No  Fall Risk Assessment In the past year have you fallen or had a near fall?:No Are you currently taking any medications that make you dizzy?:some, muscle relaxer and pain meds need to be reviewed, as I have discussed with pt repeatedly in the past   Hearing Difficulties: No Do you often ask people to speak up or repeat themselves?:No Do you experience ringing or noises in your ears?:No Do  you have difficulty understanding soft or whispered voices?:No  Cognitive Testing  Alert? Yes Normal Appearance?Yes  Oriented to person? Yes Place? Yes  Time? Yes  Displays appropriate judgment?Yes  Can read the correct time from a watch face? yes Are you having problems remembering things?No  Advanced Directives have been discussed with the patient?Yes, brochure given  , full code   List the Names of Other Physician/Practitioners you currently use:  Dr Luna Glasgow (ortho) Dr Gala Romney (GI) Dr Gershon Cull) Dr Derrill Kay (GI)   Indicate any recent Medical Services you may have received from other than [facility] providers in the past year (date may be approximate).   Assessment:    Annual Wellness Exam   Plan:    Medicare Attestation  I have personally reviewed:  The patient's medical and social history  Their use of alcohol, tobacco or illicit drugs  Their current medications and supplements  The patient's functional ability including ADLs,fall risks, home safety risks, cognitive, and hearing and visual impairment  Diet and physical activities  Evidence for depression or mood disorders  The patient's weight, height, BMI, and visual acuity have been recorded in the chart. I have made referrals, counseling, and provided education to the patient based on review of the above and I have provided the patient with a written personalized care plan for preventive services.      Review of Systems     Objective:   Physical Exam BP 118/80 mmHg  Pulse 63  Resp 16  Ht 5\' 6"  (1.676 m)  Wt 130  lb 1.9 oz (59.022 kg)  BMI 21.01 kg/m2  SpO2 100%  LMP 01/19/1997        Assessment & Plan:  Medicare annual wellness visit, subsequent Annual exam as documented. Counseling done  re healthy lifestyle involving commitment to 150 minutes exercise per week, heart healthy diet, and attaining healthy weight.The importance of adequate sleep also discussed. Regular seat belt use and home safety, is also  discussed. Changes in health habits are decided on by the patient with goals and time frames  set for achieving them. Immunization and cancer screening needs are specifically addressed at this visit.   Need for prophylactic vaccination and inoculation against influenza Vaccine administered at visit.

## 2013-12-13 NOTE — Assessment & Plan Note (Signed)
Vaccine administered at visit.  

## 2013-12-16 ENCOUNTER — Encounter: Payer: Self-pay | Admitting: Family Medicine

## 2013-12-20 DIAGNOSIS — R1110 Vomiting, unspecified: Secondary | ICD-10-CM | POA: Diagnosis not present

## 2013-12-20 DIAGNOSIS — R112 Nausea with vomiting, unspecified: Secondary | ICD-10-CM | POA: Diagnosis not present

## 2013-12-22 ENCOUNTER — Other Ambulatory Visit: Payer: Self-pay | Admitting: Gastroenterology

## 2013-12-22 DIAGNOSIS — Z720 Tobacco use: Secondary | ICD-10-CM | POA: Diagnosis not present

## 2013-12-22 DIAGNOSIS — M25551 Pain in right hip: Secondary | ICD-10-CM | POA: Diagnosis not present

## 2013-12-28 DIAGNOSIS — R112 Nausea with vomiting, unspecified: Secondary | ICD-10-CM | POA: Diagnosis not present

## 2014-01-22 DIAGNOSIS — M25571 Pain in right ankle and joints of right foot: Secondary | ICD-10-CM | POA: Diagnosis not present

## 2014-01-22 DIAGNOSIS — M25551 Pain in right hip: Secondary | ICD-10-CM | POA: Diagnosis not present

## 2014-01-22 DIAGNOSIS — Z720 Tobacco use: Secondary | ICD-10-CM | POA: Diagnosis not present

## 2014-02-08 ENCOUNTER — Other Ambulatory Visit: Payer: Self-pay | Admitting: Gastroenterology

## 2014-02-08 DIAGNOSIS — Z5181 Encounter for therapeutic drug level monitoring: Secondary | ICD-10-CM | POA: Diagnosis not present

## 2014-02-08 DIAGNOSIS — M791 Myalgia: Secondary | ICD-10-CM | POA: Diagnosis not present

## 2014-02-08 DIAGNOSIS — Z79899 Other long term (current) drug therapy: Secondary | ICD-10-CM | POA: Diagnosis not present

## 2014-02-08 DIAGNOSIS — R1032 Left lower quadrant pain: Secondary | ICD-10-CM | POA: Diagnosis not present

## 2014-02-08 DIAGNOSIS — R1013 Epigastric pain: Secondary | ICD-10-CM | POA: Diagnosis not present

## 2014-02-08 DIAGNOSIS — D214 Benign neoplasm of connective and other soft tissue of abdomen: Secondary | ICD-10-CM | POA: Diagnosis not present

## 2014-02-08 DIAGNOSIS — M792 Neuralgia and neuritis, unspecified: Secondary | ICD-10-CM | POA: Diagnosis not present

## 2014-02-26 ENCOUNTER — Emergency Department (HOSPITAL_COMMUNITY): Payer: Medicare Other

## 2014-02-26 ENCOUNTER — Inpatient Hospital Stay (HOSPITAL_COMMUNITY)
Admission: EM | Admit: 2014-02-26 | Discharge: 2014-02-28 | DRG: 605 | Disposition: A | Discharge status: Home / Self Care | Payer: Medicare Other | Attending: Internal Medicine | Admitting: Internal Medicine

## 2014-02-26 ENCOUNTER — Encounter (HOSPITAL_COMMUNITY): Payer: Self-pay | Admitting: Emergency Medicine

## 2014-02-26 DIAGNOSIS — M25432 Effusion, left wrist: Secondary | ICD-10-CM | POA: Diagnosis not present

## 2014-02-26 DIAGNOSIS — M25532 Pain in left wrist: Secondary | ICD-10-CM | POA: Diagnosis not present

## 2014-02-26 DIAGNOSIS — K311 Adult hypertrophic pyloric stenosis: Secondary | ICD-10-CM | POA: Diagnosis present

## 2014-02-26 DIAGNOSIS — Z820 Family history of epilepsy and other diseases of the nervous system: Secondary | ICD-10-CM

## 2014-02-26 DIAGNOSIS — D638 Anemia in other chronic diseases classified elsewhere: Secondary | ICD-10-CM | POA: Diagnosis present

## 2014-02-26 DIAGNOSIS — L03114 Cellulitis of left upper limb: Secondary | ICD-10-CM | POA: Diagnosis present

## 2014-02-26 DIAGNOSIS — Z8249 Family history of ischemic heart disease and other diseases of the circulatory system: Secondary | ICD-10-CM

## 2014-02-26 DIAGNOSIS — J45991 Cough variant asthma: Secondary | ICD-10-CM | POA: Diagnosis present

## 2014-02-26 DIAGNOSIS — Z91040 Latex allergy status: Secondary | ICD-10-CM

## 2014-02-26 DIAGNOSIS — W503XXA Accidental bite by another person, initial encounter: Secondary | ICD-10-CM

## 2014-02-26 DIAGNOSIS — M7989 Other specified soft tissue disorders: Secondary | ICD-10-CM | POA: Diagnosis not present

## 2014-02-26 DIAGNOSIS — F1210 Cannabis abuse, uncomplicated: Secondary | ICD-10-CM | POA: Diagnosis present

## 2014-02-26 DIAGNOSIS — J449 Chronic obstructive pulmonary disease, unspecified: Secondary | ICD-10-CM | POA: Diagnosis present

## 2014-02-26 DIAGNOSIS — F17210 Nicotine dependence, cigarettes, uncomplicated: Secondary | ICD-10-CM | POA: Diagnosis present

## 2014-02-26 DIAGNOSIS — D649 Anemia, unspecified: Secondary | ICD-10-CM | POA: Diagnosis present

## 2014-02-26 DIAGNOSIS — M79641 Pain in right hand: Secondary | ICD-10-CM | POA: Diagnosis not present

## 2014-02-26 DIAGNOSIS — K219 Gastro-esophageal reflux disease without esophagitis: Secondary | ICD-10-CM | POA: Diagnosis present

## 2014-02-26 DIAGNOSIS — Z811 Family history of alcohol abuse and dependence: Secondary | ICD-10-CM

## 2014-02-26 DIAGNOSIS — Z818 Family history of other mental and behavioral disorders: Secondary | ICD-10-CM

## 2014-02-26 DIAGNOSIS — K5900 Constipation, unspecified: Secondary | ICD-10-CM | POA: Diagnosis not present

## 2014-02-26 DIAGNOSIS — S61459A Open bite of unspecified hand, initial encounter: Secondary | ICD-10-CM | POA: Diagnosis not present

## 2014-02-26 DIAGNOSIS — Z880 Allergy status to penicillin: Secondary | ICD-10-CM

## 2014-02-26 DIAGNOSIS — S61452A Open bite of left hand, initial encounter: Secondary | ICD-10-CM | POA: Diagnosis not present

## 2014-02-26 DIAGNOSIS — S6992XA Unspecified injury of left wrist, hand and finger(s), initial encounter: Secondary | ICD-10-CM | POA: Diagnosis not present

## 2014-02-26 DIAGNOSIS — Z833 Family history of diabetes mellitus: Secondary | ICD-10-CM

## 2014-02-26 DIAGNOSIS — M810 Age-related osteoporosis without current pathological fracture: Secondary | ICD-10-CM | POA: Diagnosis present

## 2014-02-26 DIAGNOSIS — S6991XA Unspecified injury of right wrist, hand and finger(s), initial encounter: Secondary | ICD-10-CM | POA: Diagnosis not present

## 2014-02-26 DIAGNOSIS — M79642 Pain in left hand: Secondary | ICD-10-CM | POA: Diagnosis not present

## 2014-02-26 LAB — I-STAT CHEM 8, ED
BUN: 17 mg/dL (ref 6–23)
CALCIUM ION: 1.14 mmol/L (ref 1.12–1.23)
Chloride: 102 mmol/L (ref 96–112)
Creatinine, Ser: 0.8 mg/dL (ref 0.50–1.10)
GLUCOSE: 86 mg/dL (ref 70–99)
HEMATOCRIT: 41 % (ref 36.0–46.0)
HEMOGLOBIN: 13.9 g/dL (ref 12.0–15.0)
Potassium: 3.9 mmol/L (ref 3.5–5.1)
Sodium: 143 mmol/L (ref 135–145)
TCO2: 24 mmol/L (ref 0–100)

## 2014-02-26 LAB — CBC WITH DIFFERENTIAL/PLATELET
Basophils Absolute: 0 10*3/uL (ref 0.0–0.1)
Basophils Relative: 0 % (ref 0–1)
EOS ABS: 0 10*3/uL (ref 0.0–0.7)
Eosinophils Relative: 0 % (ref 0–5)
HEMATOCRIT: 36.5 % (ref 36.0–46.0)
Hemoglobin: 12.5 g/dL (ref 12.0–15.0)
LYMPHS PCT: 29 % (ref 12–46)
Lymphs Abs: 3.1 10*3/uL (ref 0.7–4.0)
MCH: 30.4 pg (ref 26.0–34.0)
MCHC: 34.2 g/dL (ref 30.0–36.0)
MCV: 88.8 fL (ref 78.0–100.0)
MONO ABS: 1 10*3/uL (ref 0.1–1.0)
Monocytes Relative: 10 % (ref 3–12)
NEUTROS ABS: 6.4 10*3/uL (ref 1.7–7.7)
Neutrophils Relative %: 61 % (ref 43–77)
PLATELETS: 171 10*3/uL (ref 150–400)
RBC: 4.11 MIL/uL (ref 3.87–5.11)
RDW: 13.9 % (ref 11.5–15.5)
WBC: 10.6 10*3/uL — AB (ref 4.0–10.5)

## 2014-02-26 LAB — SEDIMENTATION RATE: Sed Rate: 15 mm/hr (ref 0–22)

## 2014-02-26 MED ORDER — MORPHINE SULFATE 4 MG/ML IJ SOLN
4.0000 mg | Freq: Once | INTRAMUSCULAR | Status: AC
  Administered 2014-02-26: 4 mg via INTRAVENOUS
  Filled 2014-02-26: qty 1

## 2014-02-26 MED ORDER — DEXTROSE 5 % IV SOLN
1.0000 g | Freq: Once | INTRAVENOUS | Status: AC
  Administered 2014-02-26: 1 g via INTRAVENOUS
  Filled 2014-02-26: qty 10

## 2014-02-26 MED ORDER — METRONIDAZOLE IN NACL 5-0.79 MG/ML-% IV SOLN
500.0000 mg | Freq: Once | INTRAVENOUS | Status: AC
  Administered 2014-02-26: 500 mg via INTRAVENOUS
  Filled 2014-02-26: qty 100
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
## 2014-02-27 DIAGNOSIS — F17210 Nicotine dependence, cigarettes, uncomplicated: Secondary | ICD-10-CM | POA: Diagnosis present

## 2014-02-27 DIAGNOSIS — S61452S Open bite of left hand, sequela: Secondary | ICD-10-CM

## 2014-02-27 DIAGNOSIS — W503XXA Accidental bite by another person, initial encounter: Secondary | ICD-10-CM

## 2014-02-27 DIAGNOSIS — Z818 Family history of other mental and behavioral disorders: Secondary | ICD-10-CM | POA: Diagnosis not present

## 2014-02-27 DIAGNOSIS — F1210 Cannabis abuse, uncomplicated: Secondary | ICD-10-CM | POA: Diagnosis present

## 2014-02-27 DIAGNOSIS — S61459A Open bite of unspecified hand, initial encounter: Secondary | ICD-10-CM | POA: Diagnosis present

## 2014-02-27 DIAGNOSIS — Z820 Family history of epilepsy and other diseases of the nervous system: Secondary | ICD-10-CM | POA: Diagnosis not present

## 2014-02-27 DIAGNOSIS — K311 Adult hypertrophic pyloric stenosis: Secondary | ICD-10-CM | POA: Diagnosis present

## 2014-02-27 DIAGNOSIS — D649 Anemia, unspecified: Secondary | ICD-10-CM

## 2014-02-27 DIAGNOSIS — K5900 Constipation, unspecified: Secondary | ICD-10-CM | POA: Diagnosis not present

## 2014-02-27 DIAGNOSIS — K219 Gastro-esophageal reflux disease without esophagitis: Secondary | ICD-10-CM | POA: Diagnosis not present

## 2014-02-27 DIAGNOSIS — L03114 Cellulitis of left upper limb: Secondary | ICD-10-CM | POA: Diagnosis present

## 2014-02-27 DIAGNOSIS — M810 Age-related osteoporosis without current pathological fracture: Secondary | ICD-10-CM | POA: Diagnosis present

## 2014-02-27 DIAGNOSIS — J449 Chronic obstructive pulmonary disease, unspecified: Secondary | ICD-10-CM | POA: Diagnosis present

## 2014-02-27 DIAGNOSIS — D638 Anemia in other chronic diseases classified elsewhere: Secondary | ICD-10-CM | POA: Diagnosis present

## 2014-02-27 DIAGNOSIS — M7989 Other specified soft tissue disorders: Secondary | ICD-10-CM | POA: Diagnosis present

## 2014-02-27 DIAGNOSIS — Z880 Allergy status to penicillin: Secondary | ICD-10-CM | POA: Diagnosis not present

## 2014-02-27 DIAGNOSIS — Z8249 Family history of ischemic heart disease and other diseases of the circulatory system: Secondary | ICD-10-CM | POA: Diagnosis not present

## 2014-02-27 DIAGNOSIS — Z91040 Latex allergy status: Secondary | ICD-10-CM | POA: Diagnosis not present

## 2014-02-27 DIAGNOSIS — Z833 Family history of diabetes mellitus: Secondary | ICD-10-CM | POA: Diagnosis not present

## 2014-02-27 DIAGNOSIS — J45991 Cough variant asthma: Secondary | ICD-10-CM | POA: Diagnosis not present

## 2014-02-27 DIAGNOSIS — Z811 Family history of alcohol abuse and dependence: Secondary | ICD-10-CM | POA: Diagnosis not present

## 2014-02-27 DIAGNOSIS — S61452A Open bite of left hand, initial encounter: Secondary | ICD-10-CM | POA: Diagnosis present

## 2014-02-27 LAB — COMPREHENSIVE METABOLIC PANEL
ALT: 14 U/L (ref 0–35)
AST: 22 U/L (ref 0–37)
Albumin: 3.9 g/dL (ref 3.5–5.2)
Alkaline Phosphatase: 80 U/L (ref 39–117)
Anion gap: 10 (ref 5–15)
BUN: 17 mg/dL (ref 6–23)
CALCIUM: 9.2 mg/dL (ref 8.4–10.5)
CO2: 26 mmol/L (ref 19–32)
CREATININE: 0.88 mg/dL (ref 0.50–1.10)
Chloride: 104 mmol/L (ref 96–112)
GFR calc Af Amer: >90 mL/min (ref >90)
GFR calc non Af Amer: 84 mL/min — ABNORMAL LOW (ref >90)
Glucose, Bld: 79 mg/dL (ref 70–99)
Potassium: 3.7 mmol/L (ref 3.5–5.1)
Sodium: 140 mmol/L (ref 135–145)
Total Bilirubin: 0.8 mg/dL (ref 0.3–1.2)
Total Protein: 6.5 g/dL (ref 6.0–8.3)

## 2014-02-27 LAB — CK
CK TOTAL: 329 U/L — AB (ref 7–177)
Total CK: 219 U/L — ABNORMAL HIGH (ref 7–177)

## 2014-02-27 LAB — URINALYSIS W MICROSCOPIC (NOT AT ARMC)
Glucose, UA: NEGATIVE mg/dL
HGB URINE DIPSTICK: NEGATIVE
KETONES UR: 15 mg/dL — AB
Leukocytes, UA: NEGATIVE
Nitrite: NEGATIVE
Protein, ur: NEGATIVE mg/dL
Specific Gravity, Urine: 1.029 (ref 1.005–1.030)
Urobilinogen, UA: 0.2 mg/dL (ref 0.0–1.0)
pH: 5 (ref 5.0–8.0)

## 2014-02-27 LAB — CBC WITH DIFFERENTIAL/PLATELET
BASOS ABS: 0 10*3/uL (ref 0.0–0.1)
Basophils Relative: 0 % (ref 0–1)
Eosinophils Absolute: 0.1 10*3/uL (ref 0.0–0.7)
Eosinophils Relative: 1 % (ref 0–5)
HCT: 35.5 % — ABNORMAL LOW (ref 36.0–46.0)
HEMOGLOBIN: 11.8 g/dL — AB (ref 12.0–15.0)
Lymphocytes Relative: 32 % (ref 12–46)
Lymphs Abs: 2.2 10*3/uL (ref 0.7–4.0)
MCH: 30.3 pg (ref 26.0–34.0)
MCHC: 33.2 g/dL (ref 30.0–36.0)
MCV: 91.3 fL (ref 78.0–100.0)
MONO ABS: 0.6 10*3/uL (ref 0.1–1.0)
Monocytes Relative: 9 % (ref 3–12)
NEUTROS ABS: 4 10*3/uL (ref 1.7–7.7)
NEUTROS PCT: 58 % (ref 43–77)
Platelets: 157 10*3/uL (ref 150–400)
RBC: 3.89 MIL/uL (ref 3.87–5.11)
RDW: 14 % (ref 11.5–15.5)
WBC: 6.9 10*3/uL (ref 4.0–10.5)

## 2014-02-27 LAB — C-REACTIVE PROTEIN: CRP: 0.7 mg/dL — AB (ref <0.60)

## 2014-02-27 MED ORDER — GABAPENTIN 100 MG PO CAPS
100.0000 mg | ORAL_CAPSULE | Freq: Three times a day (TID) | ORAL | Status: DC
  Administered 2014-02-27 – 2014-02-28 (×5): 100 mg via ORAL
  Filled 2014-02-27 (×8): qty 1

## 2014-02-27 MED ORDER — ALBUTEROL SULFATE HFA 108 (90 BASE) MCG/ACT IN AERS: 2.0000 | INHALATION_SPRAY | Freq: Four times a day (QID) | RESPIRATORY_TRACT | Status: DC | PRN

## 2014-02-27 MED ORDER — METRONIDAZOLE IN NACL 5-0.79 MG/ML-% IV SOLN
500.0000 mg | Freq: Three times a day (TID) | INTRAVENOUS | Status: DC
  Administered 2014-02-27 – 2014-02-28 (×4): 500 mg via INTRAVENOUS
  Filled 2014-02-27 (×7): qty 100

## 2014-02-27 MED ORDER — SODIUM CHLORIDE 0.9 % IV SOLN
INTRAVENOUS | Status: DC
  Administered 2014-02-27 – 2014-02-28 (×2): via INTRAVENOUS

## 2014-02-27 MED ORDER — METOCLOPRAMIDE HCL 10 MG PO TABS
10.0000 mg | ORAL_TABLET | Freq: Four times a day (QID) | ORAL | Status: DC | PRN
  Administered 2014-02-27: 10 mg via ORAL
  Filled 2014-02-27: qty 1

## 2014-02-27 MED ORDER — DEXTROSE 5 % IV SOLN
1.0000 g | INTRAVENOUS | Status: DC
  Administered 2014-02-27: 1 g via INTRAVENOUS
  Filled 2014-02-27 (×2): qty 10

## 2014-02-27 MED ORDER — ACETAMINOPHEN 325 MG PO TABS: 650.0000 mg | ORAL_TABLET | Freq: Four times a day (QID) | ORAL | Status: DC | PRN

## 2014-02-27 MED ORDER — FLUTICASONE PROPIONATE 50 MCG/ACT NA SUSP
2.0000 | Freq: Every day | NASAL | Status: DC
  Administered 2014-02-27 – 2014-02-28 (×2): 2 via NASAL
  Filled 2014-02-27: qty 16

## 2014-02-27 MED ORDER — SUCRALFATE 1 GM/10ML PO SUSP
1.0000 g | Freq: Three times a day (TID) | ORAL | Status: DC
  Administered 2014-02-27 – 2014-02-28 (×7): 1 g via ORAL
  Filled 2014-02-27 (×9): qty 10

## 2014-02-27 MED ORDER — SIMVASTATIN 20 MG PO TABS
20.0000 mg | ORAL_TABLET | Freq: Every day | ORAL | Status: DC
  Administered 2014-02-27 – 2014-02-28 (×2): 20 mg via ORAL
  Filled 2014-02-27 (×3): qty 1

## 2014-02-27 MED ORDER — OXYCODONE HCL 5 MG PO TABS
5.0000 mg | ORAL_TABLET | ORAL | Status: DC | PRN
  Administered 2014-02-27 – 2014-02-28 (×6): 5 mg via ORAL
  Filled 2014-02-27 (×5): qty 1

## 2014-02-27 MED ORDER — FERROUS SULFATE 325 (65 FE) MG PO TABS
325.0000 mg | ORAL_TABLET | Freq: Every day | ORAL | Status: DC
  Administered 2014-02-27 – 2014-02-28 (×2): 325 mg via ORAL
  Filled 2014-02-27 (×3): qty 1

## 2014-02-27 MED ORDER — HEPARIN SODIUM (PORCINE) 5000 UNIT/ML IJ SOLN
5000.0000 [IU] | Freq: Three times a day (TID) | INTRAMUSCULAR | Status: DC
  Administered 2014-02-27 – 2014-02-28 (×6): 5000 [IU] via SUBCUTANEOUS
  Filled 2014-02-27 (×8): qty 1

## 2014-02-27 MED ORDER — TIZANIDINE HCL 4 MG PO TABS: 4.0000 mg | ORAL_TABLET | Freq: Four times a day (QID) | ORAL | Status: DC | PRN

## 2014-02-27 MED ORDER — ALBUTEROL SULFATE (2.5 MG/3ML) 0.083% IN NEBU: 2.5000 mg | INHALATION_SOLUTION | Freq: Four times a day (QID) | RESPIRATORY_TRACT | Status: DC | PRN

## 2014-02-27 MED ORDER — NICOTINE 7 MG/24HR TD PT24
7.0000 mg | MEDICATED_PATCH | Freq: Every day | TRANSDERMAL | Status: DC
  Administered 2014-02-27 – 2014-02-28 (×2): 7 mg via TRANSDERMAL
  Filled 2014-02-27 (×2): qty 1

## 2014-02-27 MED ORDER — ACETAMINOPHEN 650 MG RE SUPP: 650.0000 mg | Freq: Four times a day (QID) | RECTAL | Status: DC | PRN

## 2014-02-27 MED ORDER — ONDANSETRON HCL 4 MG PO TABS
4.0000 mg | ORAL_TABLET | Freq: Four times a day (QID) | ORAL | Status: DC | PRN
  Administered 2014-02-27: 4 mg via ORAL
  Filled 2014-02-27: qty 1

## 2014-02-27 MED ORDER — ONDANSETRON HCL 4 MG/2ML IJ SOLN
4.0000 mg | Freq: Four times a day (QID) | INTRAMUSCULAR | Status: DC | PRN
  Administered 2014-02-28: 4 mg via INTRAVENOUS
  Filled 2014-02-27: qty 2

## 2014-02-27 MED ORDER — PANTOPRAZOLE SODIUM 40 MG PO TBEC
40.0000 mg | DELAYED_RELEASE_TABLET | Freq: Two times a day (BID) | ORAL | Status: DC
  Administered 2014-02-27 – 2014-02-28 (×4): 40 mg via ORAL
  Filled 2014-02-27 (×4): qty 1

## 2014-02-27 MED ORDER — BUDESONIDE-FORMOTEROL FUMARATE 80-4.5 MCG/ACT IN AERO
2.0000 | INHALATION_SPRAY | Freq: Every evening | RESPIRATORY_TRACT | Status: DC
  Administered 2014-02-27: 2 via RESPIRATORY_TRACT
  Filled 2014-02-27: qty 6.9

## 2014-02-27 MED ORDER — DULOXETINE HCL 30 MG PO CPEP
30.0000 mg | ORAL_CAPSULE | Freq: Every day | ORAL | Status: DC
  Administered 2014-02-27 – 2014-02-28 (×2): 30 mg via ORAL
  Filled 2014-02-27 (×3): qty 1
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
## 2014-02-27 NOTE — Progress Notes (Signed)
Utilization review completed.  

## 2014-02-28 LAB — CBC
HCT: 31.6 % — ABNORMAL LOW (ref 36.0–46.0)
Hemoglobin: 10.5 g/dL — ABNORMAL LOW (ref 12.0–15.0)
MCH: 30.4 pg (ref 26.0–34.0)
MCHC: 33.2 g/dL (ref 30.0–36.0)
MCV: 91.6 fL (ref 78.0–100.0)
PLATELETS: 135 10*3/uL — AB (ref 150–400)
RBC: 3.45 MIL/uL — ABNORMAL LOW (ref 3.87–5.11)
RDW: 13.7 % (ref 11.5–15.5)
WBC: 4.7 10*3/uL (ref 4.0–10.5)

## 2014-02-28 LAB — BASIC METABOLIC PANEL
Anion gap: 9 (ref 5–15)
BUN: 12 mg/dL (ref 6–23)
CO2: 24 mmol/L (ref 19–32)
Calcium: 8.9 mg/dL (ref 8.4–10.5)
Chloride: 106 mmol/L (ref 96–112)
Creatinine, Ser: 1.06 mg/dL (ref 0.50–1.10)
GFR calc Af Amer: 77 mL/min — ABNORMAL LOW (ref >90)
GFR calc non Af Amer: 67 mL/min — ABNORMAL LOW (ref >90)
Glucose, Bld: 119 mg/dL — ABNORMAL HIGH (ref 70–99)
Potassium: 3.8 mmol/L (ref 3.5–5.1)
SODIUM: 139 mmol/L (ref 135–145)

## 2014-02-28 LAB — CK: CK TOTAL: 123 U/L (ref 7–177)

## 2014-02-28 MED ORDER — OXYCODONE HCL 5 MG PO TABS: 5.0000 mg | ORAL_TABLET | ORAL | Status: DC | PRN

## 2014-02-28 MED ORDER — WHITE PETROLATUM GEL
Status: AC
  Administered 2014-02-28: 09:00:00
  Filled 2014-02-28: qty 1

## 2014-02-28 MED ORDER — CIPROFLOXACIN HCL 500 MG PO TABS: 500.0000 mg | ORAL_TABLET | Freq: Two times a day (BID) | ORAL | Status: DC

## 2014-02-28 MED ORDER — CIPROFLOXACIN HCL 500 MG PO TABS
500.0000 mg | ORAL_TABLET | Freq: Two times a day (BID) | ORAL | Status: DC
  Administered 2014-02-28: 500 mg via ORAL
  Filled 2014-02-28: qty 1

## 2014-02-28 MED ORDER — ACETAMINOPHEN 325 MG PO TABS: 650.0000 mg | ORAL_TABLET | Freq: Four times a day (QID) | ORAL | Status: DC | PRN

## 2014-02-28 MED ORDER — METRONIDAZOLE 500 MG PO TABS: 500.0000 mg | ORAL_TABLET | Freq: Three times a day (TID) | ORAL | Status: DC

## 2014-02-28 MED ORDER — METRONIDAZOLE 500 MG PO TABS
500.0000 mg | ORAL_TABLET | Freq: Three times a day (TID) | ORAL | Status: DC
  Administered 2014-02-28: 500 mg via ORAL
  Filled 2014-02-28 (×4): qty 1

## 2014-02-28 MED ORDER — OXYCODONE HCL 5 MG PO TABS
ORAL_TABLET | ORAL | Status: AC
  Filled 2014-02-28: qty 1
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
## 2014-03-02 DIAGNOSIS — Z720 Tobacco use: Secondary | ICD-10-CM | POA: Diagnosis not present

## 2014-03-02 DIAGNOSIS — M25551 Pain in right hip: Secondary | ICD-10-CM | POA: Diagnosis not present

## 2014-03-25 ENCOUNTER — Emergency Department (HOSPITAL_COMMUNITY)
Admission: EM | Admit: 2014-03-25 | Discharge: 2014-03-25 | Disposition: A | Discharge status: Home / Self Care | Payer: Medicare Other | Attending: Emergency Medicine | Admitting: Emergency Medicine

## 2014-03-25 ENCOUNTER — Emergency Department (HOSPITAL_COMMUNITY): Payer: Medicare Other

## 2014-03-25 ENCOUNTER — Encounter (HOSPITAL_COMMUNITY): Payer: Self-pay | Admitting: Emergency Medicine

## 2014-03-25 DIAGNOSIS — Z79899 Other long term (current) drug therapy: Secondary | ICD-10-CM | POA: Diagnosis not present

## 2014-03-25 DIAGNOSIS — Z880 Allergy status to penicillin: Secondary | ICD-10-CM | POA: Diagnosis not present

## 2014-03-25 DIAGNOSIS — R1013 Epigastric pain: Secondary | ICD-10-CM | POA: Insufficient documentation

## 2014-03-25 DIAGNOSIS — R112 Nausea with vomiting, unspecified: Secondary | ICD-10-CM | POA: Insufficient documentation

## 2014-03-25 DIAGNOSIS — Z3202 Encounter for pregnancy test, result negative: Secondary | ICD-10-CM | POA: Diagnosis not present

## 2014-03-25 DIAGNOSIS — Z7951 Long term (current) use of inhaled steroids: Secondary | ICD-10-CM | POA: Diagnosis not present

## 2014-03-25 DIAGNOSIS — D649 Anemia, unspecified: Secondary | ICD-10-CM | POA: Insufficient documentation

## 2014-03-25 DIAGNOSIS — Z8659 Personal history of other mental and behavioral disorders: Secondary | ICD-10-CM | POA: Insufficient documentation

## 2014-03-25 DIAGNOSIS — Z8679 Personal history of other diseases of the circulatory system: Secondary | ICD-10-CM | POA: Diagnosis not present

## 2014-03-25 DIAGNOSIS — R109 Unspecified abdominal pain: Secondary | ICD-10-CM | POA: Diagnosis not present

## 2014-03-25 DIAGNOSIS — E559 Vitamin D deficiency, unspecified: Secondary | ICD-10-CM | POA: Diagnosis not present

## 2014-03-25 DIAGNOSIS — Z91040 Latex allergy status: Secondary | ICD-10-CM | POA: Diagnosis not present

## 2014-03-25 DIAGNOSIS — I499 Cardiac arrhythmia, unspecified: Secondary | ICD-10-CM | POA: Diagnosis not present

## 2014-03-25 DIAGNOSIS — Z720 Tobacco use: Secondary | ICD-10-CM | POA: Insufficient documentation

## 2014-03-25 DIAGNOSIS — J449 Chronic obstructive pulmonary disease, unspecified: Secondary | ICD-10-CM | POA: Insufficient documentation

## 2014-03-25 DIAGNOSIS — G8929 Other chronic pain: Secondary | ICD-10-CM | POA: Diagnosis not present

## 2014-03-25 DIAGNOSIS — K2970 Gastritis, unspecified, without bleeding: Secondary | ICD-10-CM | POA: Diagnosis not present

## 2014-03-25 DIAGNOSIS — R197 Diarrhea, unspecified: Secondary | ICD-10-CM | POA: Diagnosis not present

## 2014-03-25 DIAGNOSIS — R1110 Vomiting, unspecified: Secondary | ICD-10-CM | POA: Diagnosis not present

## 2014-03-25 LAB — COMPREHENSIVE METABOLIC PANEL
ALT: 26 U/L (ref 0–35)
AST: 39 U/L — AB (ref 0–37)
Albumin: 4.8 g/dL (ref 3.5–5.2)
Alkaline Phosphatase: 62 U/L (ref 39–117)
Anion gap: 11 (ref 5–15)
BUN: 15 mg/dL (ref 6–23)
CO2: 25 mmol/L (ref 19–32)
CREATININE: 0.87 mg/dL (ref 0.50–1.10)
Calcium: 9.8 mg/dL (ref 8.4–10.5)
Chloride: 105 mmol/L (ref 96–112)
GFR calc Af Amer: >90 mL/min (ref >90)
GFR calc non Af Amer: 85 mL/min — ABNORMAL LOW (ref >90)
GLUCOSE: 113 mg/dL — AB (ref 70–99)
POTASSIUM: 3.8 mmol/L (ref 3.5–5.1)
Sodium: 141 mmol/L (ref 135–145)
Total Bilirubin: 0.6 mg/dL (ref 0.3–1.2)
Total Protein: 7.8 g/dL (ref 6.0–8.3)

## 2014-03-25 LAB — CBC WITH DIFFERENTIAL/PLATELET
Basophils Absolute: 0 10*3/uL (ref 0.0–0.1)
Basophils Relative: 0 % (ref 0–1)
EOS ABS: 0 10*3/uL (ref 0.0–0.7)
Eosinophils Relative: 0 % (ref 0–5)
HCT: 39.1 % (ref 36.0–46.0)
Hemoglobin: 13.1 g/dL (ref 12.0–15.0)
LYMPHS PCT: 13 % (ref 12–46)
Lymphs Abs: 1.9 10*3/uL (ref 0.7–4.0)
MCH: 30 pg (ref 26.0–34.0)
MCHC: 33.5 g/dL (ref 30.0–36.0)
MCV: 89.7 fL (ref 78.0–100.0)
Monocytes Absolute: 0.6 10*3/uL (ref 0.1–1.0)
Monocytes Relative: 4 % (ref 3–12)
NEUTROS PCT: 83 % — AB (ref 43–77)
Neutro Abs: 11.9 10*3/uL — ABNORMAL HIGH (ref 1.7–7.7)
PLATELETS: 192 10*3/uL (ref 150–400)
RBC: 4.36 MIL/uL (ref 3.87–5.11)
RDW: 13.3 % (ref 11.5–15.5)
WBC: 14.5 10*3/uL — ABNORMAL HIGH (ref 4.0–10.5)

## 2014-03-25 LAB — POC URINE PREG, ED: PREG TEST UR: NEGATIVE

## 2014-03-25 LAB — URINALYSIS, ROUTINE W REFLEX MICROSCOPIC
BILIRUBIN URINE: NEGATIVE
Glucose, UA: NEGATIVE mg/dL
Hgb urine dipstick: NEGATIVE
Ketones, ur: NEGATIVE mg/dL
Leukocytes, UA: NEGATIVE
NITRITE: NEGATIVE
PROTEIN: NEGATIVE mg/dL
SPECIFIC GRAVITY, URINE: 1.021 (ref 1.005–1.030)
UROBILINOGEN UA: 0.2 mg/dL (ref 0.0–1.0)
pH: 5.5 (ref 5.0–8.0)

## 2014-03-25 LAB — I-STAT TROPONIN, ED: Troponin i, poc: 0.01 ng/mL (ref 0.00–0.08)

## 2014-03-25 LAB — LIPASE, BLOOD: Lipase: 25 U/L (ref 11–59)

## 2014-03-25 MED ORDER — HYDROMORPHONE HCL 1 MG/ML IJ SOLN
1.0000 mg | Freq: Once | INTRAMUSCULAR | Status: AC
  Administered 2014-03-25: 1 mg via INTRAVENOUS
  Filled 2014-03-25: qty 1

## 2014-03-25 MED ORDER — DIPHENHYDRAMINE HCL 50 MG/ML IJ SOLN
25.0000 mg | Freq: Once | INTRAMUSCULAR | Status: DC
  Filled 2014-03-25: qty 1

## 2014-03-25 MED ORDER — METOCLOPRAMIDE HCL 5 MG/ML IJ SOLN
10.0000 mg | Freq: Once | INTRAMUSCULAR | Status: AC
  Administered 2014-03-25: 10 mg via INTRAVENOUS
  Filled 2014-03-25: qty 2

## 2014-03-25 MED ORDER — ONDANSETRON 4 MG PO TBDP: ORAL_TABLET | ORAL | Status: DC

## 2014-03-25 MED ORDER — ONDANSETRON HCL 4 MG/2ML IJ SOLN
4.0000 mg | Freq: Once | INTRAMUSCULAR | Status: AC
  Administered 2014-03-25: 4 mg via INTRAVENOUS
  Filled 2014-03-25: qty 2

## 2014-03-25 NOTE — ED Provider Notes (Signed)
CSN: [id number]     Arrival date & time 03/25/14  1136 History   First MD Initiated Contact with Patient 03/25/14 1203     Chief Complaint  Patient presents with  . Abdominal Pain  . Emesis     (Consider location/radiation/quality/duration/timing/severity/associated sxs/prior Treatment) Patient is a 34 y.o. female presenting with abdominal pain.  Abdominal Pain Pain location:  Epigastric Pain quality: sharp   Pain radiates to:  Does not radiate Pain severity:  Severe Onset quality:  Gradual Duration:  1 day Timing:  Constant Progression:  Unchanged Chronicity:  Recurrent Context comment:  Ho gastroparesis, pyloric obstruction wtih recurrent botox injections Relieved by:  Nothing Worsened by:  Nothing tried Ineffective treatments:  None tried Associated symptoms: nausea and vomiting   Associated symptoms: no anorexia, no chills, no constipation, no cough, no dysuria, no fever and no hematuria     Past Medical History  Diagnosis Date  . Migraines   . Osteoporosis   . Seasonal allergies   . Sinusitis   . Back pain, chronic   . Nicotine addiction   . Constipation   . Vitamin D deficiency   . Gastritis   . Gastroparesis   . Pyloric spasm 03/30/2011  . Gastric outlet obstruction   . COPD (chronic obstructive pulmonary disease)   . Asthma   . Substance abuse 2008    marijuana  . Peptic ulcer disease   . Depression 2000    h/o suicidal ideation  . Anemia of other chronic disease 11/29/2012  . Chronic abdominal pain   . Nausea and vomiting     recurrent   Past Surgical History  Procedure Laterality Date  . Cholecystectomy      ?2002  . Laser surgery on cervix    . Carpal tunnel release      left hand  . Esophagogastroduodenoscopy  12/25/2010    Dorothyann Peng, MD;  moderate gastritis, ?goo secondary to pylorspasm. BX showed reactive gstropathy no h.pyori, SB mucosa with intramucosal lymphocytosis and partial villous blunting (TTG 4.0 normal)  . Esophageal dilation   12/25/2010    Procedure: ESOPHAGEAL DILATION;  Surgeon: Dorothyann Peng, MD;  Location: [facility];  Service: Endoscopy;;  . Esophagogastroduodenoscopy N/A 11/14/2012    MBW:GYKZL hiatal hernia. Abnormal gastric mucosa of  uncertain significance-status post biopsy. Subjectively, patient may have recurrent symptomatic, pylorospasm.  . Esophagogastroduodenoscopy (egd) with propofol N/A 02/09/2013    elongated stomach, partial lower esophageal ring widely patent. No obvious pyloric stenosis s/p Botox  . Botox injection N/A 02/09/2013    Procedure: BOTOX INJECTION;  Surgeon: Missy Sabins, MD;  Location: [facility];  Service: Endoscopy;  Laterality: N/A;  possible balloon  . Balloon dilation N/A 02/09/2013    Procedure: BALLOON DILATION;  Surgeon: Missy Sabins, MD;  Location: [facility];  Service: Endoscopy;  Laterality: N/A;   Family History  Problem Relation Age of Onset  . Diabetes Father   . Liver disease Father     liver transplant at [facility], age 68  . Colon cancer Neg Hx   . Lung cancer Mother 48  . Heart disease Maternal Grandmother   . Parkinson's disease Maternal Grandfather   . Multiple sclerosis Sister 50  . Depression Sister 16    bipolar and schizophrenic  . Alcohol abuse Sister    History  Substance Use Topics  . Smoking status: Current Every Day Smoker -- 0.50 packs/day for 15 years    Types: Cigarettes  . Smokeless tobacco: Never  Used     Comment: quit 2011  . Alcohol Use: No     Comment: none since July. Periodic heavy drinker for months at a time.   OB History    No data available     Review of Systems  Constitutional: Negative for fever and chills.  Respiratory: Negative for cough.   Gastrointestinal: Positive for nausea, vomiting and abdominal pain. Negative for constipation and anorexia.  Genitourinary: Negative for dysuria and hematuria.  All other systems reviewed and are negative.     Allergies  Benadryl; Latex; and Penicillins  Home Medications    Prior to Admission medications   Medication Sig Start Date End Date Taking? Authorizing Provider  acetaminophen (TYLENOL) 325 MG tablet Take 2 tablets (650 mg total) by mouth every 6 (six) hours as needed for mild pain (or Fever >/= 101). 02/28/14  Yes Geradine Girt, DO  albuterol (PROVENTIL HFA;VENTOLIN HFA) 108 (90 BASE) MCG/ACT inhaler Inhale 2 puffs into the lungs every 6 (six) hours as needed. Shortness of breath Patient taking differently: Inhale 2 puffs into the lungs every 6 (six) hours as needed for wheezing or shortness of breath. Shortness of breath 11/14/12 05/27/15 Yes [name], MD  budesonide-formoterol [facility]) 80-4.5 MCG/ACT inhaler Inhale 2 puffs into the lungs 2 (two) times daily. Patient taking differently: Inhale 2 puffs into the lungs every evening.  11/14/12 05/27/15 Yes [name], MD  Cholecalciferol (VITAMIN D3) 2000 UNITS CHEW Chew 1 capsule by mouth daily.    Yes Historical Provider, MD  ciprofloxacin (CIPRO) 500 MG tablet Take 1 tablet (500 mg total) by mouth 2 (two) times daily. 02/28/14  Yes Geradine Girt, DO  clobetasol cream (TEMOVATE) 3.89 % Apply 1 application topically 2 (two) times daily as needed (rash). Apply to rash   Yes Historical Provider, MD  DULoxetine (CYMBALTA) 30 MG capsule Take 1 capsule (30 mg total) by mouth daily. 03/02/12 09/12/14 Yes [name], MD  ergocalciferol (VITAMIN D2) 50000 UNITS capsule Take 1 capsule (50,000 Units total) by mouth once a week. One capsule once weekly 11/14/12  Yes [name], MD  estradiol (ESTRACE VAGINAL) 0.1 MG/GM vaginal cream Place 2 g vaginally at bedtime. As directed   Yes Historical Provider, MD  feeding supplement (ENSURE CLINICAL STRENGTH) LIQD Take 237 mLs by mouth 3 (three) times daily with meals. 01/01/11  Yes Nishant Dhungel, MD  ferrous sulfate 325 (65 FE) MG tablet Take 325 mg by mouth daily with breakfast.   Yes Historical Provider, MD  fluticasone (CUTIVATE) 0.05 % cream  Apply 1 application topically 2 (two) times daily.   Yes Historical Provider, MD  fluticasone (FLONASE) 50 MCG/ACT nasal spray Place 2 sprays into both nostrils daily.   Yes Historical Provider, MD  gabapentin (NEURONTIN) 100 MG capsule Take 100 mg by mouth 3 (three) times daily.   Yes Historical Provider, MD  HYDROcodone-acetaminophen (NORCO) 7.5-325 MG per tablet Take 1 tablet by mouth every 4 (four) hours as needed for moderate pain or severe pain.  10/01/13  Yes Historical Provider, MD  loratadine (CLARITIN) 10 MG tablet Take 1 tablet (10 mg total) by mouth daily. 10/09/11  Yes [name], MD  metoCLOPramide (REGLAN) 10 MG tablet Take 1 tablet (10 mg total) by mouth every 6 (six) hours as needed for nausea or vomiting. 11/06/13  Yes Clayton Bibles, PA-C  metroNIDAZOLE (FLAGYL) 500 MG tablet Take 1 tablet (500 mg total) by mouth every 8 (eight) hours. 02/28/14  Yes  Geradine Girt, DO  minoxidil (MINOXIDIL FOR WOMEN) 2 % external solution Apply topically 2 (two) times daily. 08/01/13  Yes [name], MD  Multiple Vitamin (MULTIVITAMIN WITH MINERALS) TABS tablet Take 1 tablet by mouth daily.   Yes Historical Provider, MD  ondansetron (ZOFRAN) 4 MG tablet TAKE 1 TABLET BY MOUTH EVERY 8 HOURS AS NEEDED FOR NAUSEA OR VOMITING 02/08/14  Yes Carlis Stable, NP  oxyCODONE (OXY IR/ROXICODONE) 5 MG immediate release tablet Take 1 tablet (5 mg total) by mouth every 4 (four) hours as needed for moderate pain. 02/28/14  Yes Geradine Girt, DO  pantoprazole (PROTONIX) 40 MG tablet Take 1 tablet (40 mg total) by mouth 2 (two) times daily before a meal. 10/19/13  Yes Mahala Menghini, PA-C  simvastatin (ZOCOR) 20 MG tablet Take 20 mg by mouth daily.   Yes Historical Provider, MD  sucralfate (CARAFATE) 1 GM/10ML suspension Take 10 mLs (1 g total) by mouth 4 (four) times daily -  with meals and at bedtime. 10/19/13  Yes Mahala Menghini, PA-C  tiZANidine (ZANAFLEX) 4 MG tablet Take 4 mg by mouth every 6 (six) hours as  needed for muscle spasms.  10/27/13  Yes Historical Provider, MD  calcium-vitamin D (OSCAL 500/200 D-3) 500-200 MG-UNIT per tablet Take 1 tablet by mouth 2 (two) times daily. 11/14/12 02/26/14  [name], MD  ondansetron (ZOFRAN ODT) 4 MG disintegrating tablet 4mg  ODT q4 hours prn nausea/vomit 03/25/14   Rexene Agent, MD   BP 123/57 mmHg  Pulse 61  Temp(Src) 98.3 F (36.8 C) (Oral)  Resp 16  Ht 5\' 9"  (1.753 m)  Wt 135 lb (61.236 kg)  BMI 19.93 kg/m2  SpO2 99%  LMP 01/19/1997 Physical Exam  Constitutional: She is oriented to person, place, and time. She appears well-developed and well-nourished.  HENT:  Head: Normocephalic and atraumatic.  Right Ear: External ear normal.  Left Ear: External ear normal.  Eyes: Conjunctivae and EOM are normal. Pupils are equal, round, and reactive to light.  Neck: Normal range of motion. Neck supple.  Cardiovascular: Normal rate, regular rhythm, normal heart sounds and intact distal pulses.   Pulmonary/Chest: Effort normal and breath sounds normal.  Abdominal: Soft. Bowel sounds are normal. There is tenderness in the epigastric area.  Musculoskeletal: Normal range of motion.  Neurological: She is alert and oriented to person, place, and time.  Skin: Skin is warm and dry.  Vitals reviewed.   ED Course  Procedures (including critical care time) Labs Review Labs Reviewed  CBC WITH DIFFERENTIAL/PLATELET - Abnormal; Notable for the following:    WBC 14.5 (*)    Neutrophils Relative % 83 (*)    Neutro Abs 11.9 (*)    All other components within normal limits  COMPREHENSIVE METABOLIC PANEL - Abnormal; Notable for the following:    Glucose, Bld 113 (*)    AST 39 (*)    GFR calc non Af Amer 85 (*)    All other components within normal limits  LIPASE, BLOOD  URINALYSIS, ROUTINE W REFLEX MICROSCOPIC  POC URINE PREG, ED  I-STAT TROPOININ, ED    Imaging Review No results found.   EKG Interpretation   Date/Time:  Sunday March 25 2014  14:50:04 EST Ventricular Rate:  62 PR Interval:  151 QRS Duration: 104 QT Interval:  408 QTC Calculation: 414 R Axis:   99 Text Interpretation:  Sinus arrhythmia Borderline right axis deviation  Probable left ventricular hypertrophy Abnormal T, consider ischemia,  diffuse leads Baseline wander in lead(s) V2 V3 ED PHYSICIAN INTERPRETATION  AVAILABLE IN [facility] Confirmed by TEST, Record ([id number]) on 03/27/2014  7:39:19 AM      MDM   Final diagnoses:  Epigastric pain    34 y.o. female with pertinent PMH of gastroparesis with recurrent botox injections of pylorus presents with recurrent abd pain x 1 day.  Identical to prior episodes.  No infectious signs.  Physical exam as above.   Wu unremarkable.  Symptoms relieved with dilaudid, reglan.  Pt refused benadryl as she said she was allergic to it.  This was not documented prior to this visit.  Unlikely true allergy.  DC home in stable condition.    I have reviewed all laboratory and imaging studies if ordered as above  1. Epigastric pain         Rexene Agent, MD 03/28/14 [phone]

## 2014-03-25 NOTE — ED Notes (Signed)
Pt remains monitored by blood pressure and pulse ox. Pts family remains at bedside.  

## 2014-03-25 NOTE — Discharge Instructions (Signed)

## 2014-03-25 NOTE — ED Notes (Signed)
Pt c/o abd and back pain with N/V/D; pt sts hx of gastroparesis and feels same

## 2014-03-25 NOTE — ED Notes (Signed)
Pt remains monitored by blood pressure and pulse ox.  

## 2014-03-27 DIAGNOSIS — G8929 Other chronic pain: Secondary | ICD-10-CM | POA: Diagnosis not present

## 2014-03-27 DIAGNOSIS — R109 Unspecified abdominal pain: Secondary | ICD-10-CM | POA: Diagnosis not present

## 2014-03-27 DIAGNOSIS — R1084 Generalized abdominal pain: Secondary | ICD-10-CM | POA: Diagnosis not present

## 2014-04-25 ENCOUNTER — Ambulatory Visit: Payer: Medicare Other | Admitting: Family Medicine

## 2014-04-25 DIAGNOSIS — R1013 Epigastric pain: Secondary | ICD-10-CM | POA: Diagnosis not present

## 2014-04-25 DIAGNOSIS — G894 Chronic pain syndrome: Secondary | ICD-10-CM | POA: Diagnosis not present

## 2014-04-30 DIAGNOSIS — Z6824 Body mass index (BMI) 24.0-24.9, adult: Secondary | ICD-10-CM | POA: Diagnosis not present

## 2014-04-30 DIAGNOSIS — M25551 Pain in right hip: Secondary | ICD-10-CM | POA: Diagnosis not present

## 2014-04-30 DIAGNOSIS — Z720 Tobacco use: Secondary | ICD-10-CM | POA: Diagnosis not present

## 2014-05-02 DIAGNOSIS — E559 Vitamin D deficiency, unspecified: Secondary | ICD-10-CM | POA: Diagnosis not present

## 2014-05-02 DIAGNOSIS — R5383 Other fatigue: Secondary | ICD-10-CM | POA: Diagnosis not present

## 2014-05-03 LAB — BASIC METABOLIC PANEL
BUN: 18 mg/dL (ref 6–23)
CHLORIDE: 104 meq/L (ref 96–112)
CO2: 28 meq/L (ref 19–32)
CREATININE: 0.72 mg/dL (ref 0.50–1.10)
Calcium: 9.5 mg/dL (ref 8.4–10.5)
Glucose, Bld: 82 mg/dL (ref 70–99)
Potassium: 4.7 mEq/L (ref 3.5–5.3)
SODIUM: 140 meq/L (ref 135–145)

## 2014-05-03 LAB — CBC WITH DIFFERENTIAL/PLATELET
Basophils Absolute: 0 K/uL (ref 0.0–0.1)
Basophils Relative: 0 % (ref 0–1)
Eosinophils Absolute: 0.1 K/uL (ref 0.0–0.7)
Eosinophils Relative: 1 % (ref 0–5)
HCT: 37.3 % (ref 36.0–46.0)
Hemoglobin: 12.1 g/dL (ref 12.0–15.0)
Lymphocytes Relative: 29 % (ref 12–46)
Lymphs Abs: 2.4 K/uL (ref 0.7–4.0)
MCH: 29.5 pg (ref 26.0–34.0)
MCHC: 32.4 g/dL (ref 30.0–36.0)
MCV: 91 fL (ref 78.0–100.0)
MPV: 11.9 fL (ref 9.4–12.4)
Monocytes Absolute: 0.7 K/uL (ref 0.1–1.0)
Monocytes Relative: 8 % (ref 3–12)
Neutro Abs: 5.1 K/uL (ref 1.7–7.7)
Neutrophils Relative %: 62 % (ref 43–77)
Platelets: 198 K/uL (ref 150–400)
RBC: 4.1 MIL/uL (ref 3.87–5.11)
RDW: 13.5 % (ref 11.5–15.5)
WBC: 8.2 K/uL (ref 4.0–10.5)

## 2014-05-03 LAB — VITAMIN D 25 HYDROXY (VIT D DEFICIENCY, FRACTURES): Vit D, 25-Hydroxy: 24 ng/mL — ABNORMAL LOW (ref 30–100)

## 2014-05-16 ENCOUNTER — Ambulatory Visit (INDEPENDENT_AMBULATORY_CARE_PROVIDER_SITE_OTHER): Payer: Medicare Other | Admitting: Family Medicine

## 2014-05-16 ENCOUNTER — Encounter: Payer: Self-pay | Admitting: Family Medicine

## 2014-05-16 VITALS — BP 118/62 | HR 88 | Resp 18 | Ht 69.0 in | Wt 137.1 lb

## 2014-05-16 DIAGNOSIS — F17218 Nicotine dependence, cigarettes, with other nicotine-induced disorders: Secondary | ICD-10-CM

## 2014-05-16 DIAGNOSIS — F17210 Nicotine dependence, cigarettes, uncomplicated: Secondary | ICD-10-CM

## 2014-05-16 DIAGNOSIS — K2100 Gastro-esophageal reflux disease with esophagitis, without bleeding: Secondary | ICD-10-CM

## 2014-05-16 DIAGNOSIS — J45991 Cough variant asthma: Secondary | ICD-10-CM

## 2014-05-16 DIAGNOSIS — F329 Major depressive disorder, single episode, unspecified: Secondary | ICD-10-CM | POA: Diagnosis not present

## 2014-05-16 DIAGNOSIS — K311 Adult hypertrophic pyloric stenosis: Secondary | ICD-10-CM

## 2014-05-16 DIAGNOSIS — E559 Vitamin D deficiency, unspecified: Secondary | ICD-10-CM | POA: Diagnosis not present

## 2014-05-16 DIAGNOSIS — F32A Depression, unspecified: Secondary | ICD-10-CM

## 2014-05-16 DIAGNOSIS — J302 Other seasonal allergic rhinitis: Secondary | ICD-10-CM

## 2014-05-16 MED ORDER — MOMETASONE FUROATE 50 MCG/ACT NA SUSP: 2.0000 | Freq: Two times a day (BID) | NASAL | Status: DC

## 2014-05-16 MED ORDER — PREDNISONE 5 MG PO TABS: 5.0000 mg | ORAL_TABLET | Freq: Two times a day (BID) | ORAL | Status: AC

## 2014-05-16 MED ORDER — ERGOCALCIFEROL 1.25 MG (50000 UT) PO CAPS: 50000.0000 [IU] | ORAL_CAPSULE | ORAL | Status: DC

## 2014-05-16 NOTE — Progress Notes (Signed)
Subjective:    Patient ID: Tracey Daniel, female    DOB: 09/22/1980, 34 y.o.   MRN: [id number]  HPI   Tracey Daniel     MRN: [id number]      DOB: Jan 29, 1980   HPI Ms. Herberg is here for follow up and re-evaluation of chronic medical conditions, medication management and review of any available recent lab and radiology data.  Preventive health is updated, specifically  Cancer screening and Immunization.   Questions or concerns regarding consultations or procedures which the PT has had in the interim are  Addressed.Recently had botox treatment at [facility] with good results as far as abdominal pain is concerned The PT denies any adverse reactions to current medications since the last visit.  There are no new concerns.  There are no specific complaints   ROS Denies recent fever or chills. Denies sinus pressure, nasal congestion, ear pain or sore throat. Denies chest congestion, productive cough or wheezing. Denies chest pains, palpitations and leg swelling Denies abdominal pain, nausea, vomiting,diarrhea or constipation.   Denies dysuria, frequency, hesitancy or incontinence. Denies joint pain, swelling and limitation in mobility. Denies headaches, seizures, numbness, or tingling. Denies uncontrolled  depression, anxiety or insomnia. Denies skin break down or rash.   PE  BP 118/62 mmHg  Pulse 88  Resp 18  Ht 5\' 9"  (1.753 m)  Wt 137 lb 1.9 oz (62.197 kg)  BMI 20.24 kg/m2  SpO2 98%  LMP 01/19/1997  Patient alert and oriented and in no cardiopulmonary distress.  HEENT: No facial asymmetry, EOMI,   oropharynx pink and moist.  Neck supple no JVD, no mass.  Chest: Clear to auscultation bilaterally.Decreased air entry  CVS: S1, S2 no murmurs, no S3.Regular rate.  ABD: Soft non tender.   Ext: No edema  MS: Adequate ROM spine, shoulders, hips and knees.  Skin: Intact, no ulcerations or rash noted.  Psych: Good eye contact, normal affect. Memory intact not anxious  or depressed appearing.  CNS: CN 2-12 intact, power,  normal throughout.no focal deficits noted.   Assessment & Plan   Asthma, cough variant Controlled, no change in medication   Seasonal allergies Uncontrolled, pt non compliant with meds prescribed, these are re sent, she is also to bring all meds she i actually taking to her next visit  GERD (gastroesophageal reflux disease) Controlled, no change in medication   Gastric outlet obstruction Recently seen at [facility] , has had Botox for this problem which causes chronic nausea, with some symptom relief  ECZEMA Topical steroid to be used as needed for short periods , no longer than 7 days.  Mild to moderate , no current flare  Depression Controlled, no change in medication Needs to keep appts with mental health, not doing so, but support will be very beneficial for her  Vitamin D deficiency Importance of supplementation discussed  Nicotine dependence Patient counseled for approximately 5 minutes regarding the health risks of ongoing nicotine use, specifically all types of cancer, heart disease, stroke and respiratory failure. The options available for help with cessation ,the behavioral changes to assist the process, and the option to either gradully reduce usage  Or abruptly stop.is also discussed. Pt is also encouraged to set specific goals in number of cigarettes used daily, as well as to set a quit date.  Number of cigarettes/cigars currently smoking daily:7        Review of Systems     Objective:   Physical Exam  Assessment & Plan:

## 2014-05-16 NOTE — Patient Instructions (Addendum)
F/u in 4 month, call if you need me before  New nasal spray, once daily loratidine and 5 day cours of prednisone for allergies  BRING ALL MEDS tO NEXT VISIT  It is important that you exercise regularly at least 30 minutes 5 times a week. If you develop chest pain, have severe difficulty breathing, or feel very tired, stop exercising immediately and seek medical attention   Glad that no more pain when you eat  STOP SMOKING TODAY PLEASE  Vit D is low, take once weekly vit D  Thanks for choosing [facility], we consider it a privelige to serve you.

## 2014-05-17 ENCOUNTER — Other Ambulatory Visit: Payer: Self-pay

## 2014-06-11 ENCOUNTER — Other Ambulatory Visit (HOSPITAL_COMMUNITY): Payer: Self-pay | Admitting: Orthopaedic Surgery

## 2014-06-11 DIAGNOSIS — M79604 Pain in right leg: Secondary | ICD-10-CM

## 2014-06-11 DIAGNOSIS — M5441 Lumbago with sciatica, right side: Secondary | ICD-10-CM | POA: Diagnosis not present

## 2014-06-11 DIAGNOSIS — M545 Low back pain: Secondary | ICD-10-CM

## 2014-06-11 DIAGNOSIS — Z720 Tobacco use: Secondary | ICD-10-CM | POA: Diagnosis not present

## 2014-06-11 DIAGNOSIS — Z8719 Personal history of other diseases of the digestive system: Secondary | ICD-10-CM | POA: Diagnosis not present

## 2014-06-11 DIAGNOSIS — Z8709 Personal history of other diseases of the respiratory system: Secondary | ICD-10-CM | POA: Diagnosis not present

## 2014-06-11 DIAGNOSIS — M25551 Pain in right hip: Secondary | ICD-10-CM | POA: Diagnosis not present

## 2014-06-11 DIAGNOSIS — D649 Anemia, unspecified: Secondary | ICD-10-CM | POA: Diagnosis not present

## 2014-06-11 DIAGNOSIS — Z6823 Body mass index (BMI) 23.0-23.9, adult: Secondary | ICD-10-CM | POA: Diagnosis not present

## 2014-06-21 ENCOUNTER — Ambulatory Visit (HOSPITAL_COMMUNITY)
Admission: RE | Admit: 2014-06-21 | Discharge: 2014-06-21 | Disposition: A | Discharge status: Home / Self Care | Payer: Medicare Other | Source: Ambulatory Visit | Attending: Orthopaedic Surgery | Admitting: Orthopaedic Surgery

## 2014-06-21 DIAGNOSIS — M5136 Other intervertebral disc degeneration, lumbar region: Secondary | ICD-10-CM | POA: Insufficient documentation

## 2014-06-21 DIAGNOSIS — M47816 Spondylosis without myelopathy or radiculopathy, lumbar region: Secondary | ICD-10-CM | POA: Diagnosis not present

## 2014-06-21 DIAGNOSIS — M25551 Pain in right hip: Secondary | ICD-10-CM | POA: Insufficient documentation

## 2014-06-21 DIAGNOSIS — M7138 Other bursal cyst, other site: Secondary | ICD-10-CM | POA: Insufficient documentation

## 2014-06-21 DIAGNOSIS — M79604 Pain in right leg: Secondary | ICD-10-CM | POA: Diagnosis not present

## 2014-06-21 DIAGNOSIS — M545 Low back pain: Secondary | ICD-10-CM | POA: Diagnosis not present

## 2014-06-22 DIAGNOSIS — Z8709 Personal history of other diseases of the respiratory system: Secondary | ICD-10-CM | POA: Diagnosis not present

## 2014-06-22 DIAGNOSIS — Z8719 Personal history of other diseases of the digestive system: Secondary | ICD-10-CM | POA: Diagnosis not present

## 2014-06-22 DIAGNOSIS — Z720 Tobacco use: Secondary | ICD-10-CM | POA: Diagnosis not present

## 2014-06-22 DIAGNOSIS — M5441 Lumbago with sciatica, right side: Secondary | ICD-10-CM | POA: Diagnosis not present

## 2014-06-22 DIAGNOSIS — M25551 Pain in right hip: Secondary | ICD-10-CM | POA: Diagnosis not present

## 2014-06-22 DIAGNOSIS — Z6822 Body mass index (BMI) 22.0-22.9, adult: Secondary | ICD-10-CM | POA: Diagnosis not present

## 2014-06-22 DIAGNOSIS — D649 Anemia, unspecified: Secondary | ICD-10-CM | POA: Diagnosis not present

## 2014-06-27 DIAGNOSIS — M7138 Other bursal cyst, other site: Secondary | ICD-10-CM | POA: Diagnosis not present

## 2014-06-27 DIAGNOSIS — M4696 Unspecified inflammatory spondylopathy, lumbar region: Secondary | ICD-10-CM | POA: Diagnosis not present

## 2014-07-05 ENCOUNTER — Other Ambulatory Visit: Payer: Self-pay

## 2014-07-06 MED ORDER — ONDANSETRON HCL 4 MG PO TABS: 4.0000 mg | ORAL_TABLET | Freq: Four times a day (QID) | ORAL | Status: DC | PRN

## 2014-07-07 NOTE — Assessment & Plan Note (Signed)
Controlled, no change in medication Needs to keep appts with mental health, not doing so, but support will be very beneficial for her

## 2014-07-07 NOTE — Assessment & Plan Note (Signed)
Importance of supplementation discussed

## 2014-07-07 NOTE — Assessment & Plan Note (Signed)
Recently seen at [facility] , has had Botox for this problem which causes chronic nausea, with some symptom relief

## 2014-07-07 NOTE — Assessment & Plan Note (Signed)

## 2014-07-07 NOTE — Assessment & Plan Note (Signed)
Uncontrolled, pt non compliant with meds prescribed, these are re sent, she is also to bring all meds she i actually taking to her next visit

## 2014-07-07 NOTE — Assessment & Plan Note (Signed)
Controlled, no change in medication  

## 2014-07-07 NOTE — Assessment & Plan Note (Signed)
Topical steroid to be used as needed for short periods , no longer than 7 days.  Mild to moderate , no current flare

## 2014-07-12 ENCOUNTER — Emergency Department (HOSPITAL_COMMUNITY)
Admission: EM | Admit: 2014-07-12 | Discharge: 2014-07-13 | Disposition: A | Discharge status: Home / Self Care | Payer: Medicare Other | Attending: Emergency Medicine | Admitting: Emergency Medicine

## 2014-07-12 ENCOUNTER — Encounter (HOSPITAL_COMMUNITY): Payer: Self-pay | Admitting: Emergency Medicine

## 2014-07-12 DIAGNOSIS — Z7951 Long term (current) use of inhaled steroids: Secondary | ICD-10-CM | POA: Diagnosis not present

## 2014-07-12 DIAGNOSIS — Z880 Allergy status to penicillin: Secondary | ICD-10-CM | POA: Diagnosis not present

## 2014-07-12 DIAGNOSIS — F329 Major depressive disorder, single episode, unspecified: Secondary | ICD-10-CM | POA: Insufficient documentation

## 2014-07-12 DIAGNOSIS — J449 Chronic obstructive pulmonary disease, unspecified: Secondary | ICD-10-CM | POA: Diagnosis not present

## 2014-07-12 DIAGNOSIS — M4696 Unspecified inflammatory spondylopathy, lumbar region: Secondary | ICD-10-CM | POA: Diagnosis not present

## 2014-07-12 DIAGNOSIS — K2970 Gastritis, unspecified, without bleeding: Secondary | ICD-10-CM | POA: Diagnosis not present

## 2014-07-12 DIAGNOSIS — Z720 Tobacco use: Secondary | ICD-10-CM | POA: Insufficient documentation

## 2014-07-12 DIAGNOSIS — Z79899 Other long term (current) drug therapy: Secondary | ICD-10-CM | POA: Insufficient documentation

## 2014-07-12 DIAGNOSIS — G43909 Migraine, unspecified, not intractable, without status migrainosus: Secondary | ICD-10-CM | POA: Diagnosis not present

## 2014-07-12 DIAGNOSIS — D6489 Other specified anemias: Secondary | ICD-10-CM | POA: Diagnosis not present

## 2014-07-12 DIAGNOSIS — R197 Diarrhea, unspecified: Secondary | ICD-10-CM | POA: Diagnosis not present

## 2014-07-12 DIAGNOSIS — E559 Vitamin D deficiency, unspecified: Secondary | ICD-10-CM | POA: Insufficient documentation

## 2014-07-12 DIAGNOSIS — Z91040 Latex allergy status: Secondary | ICD-10-CM | POA: Insufficient documentation

## 2014-07-12 DIAGNOSIS — R1013 Epigastric pain: Secondary | ICD-10-CM

## 2014-07-12 DIAGNOSIS — G8929 Other chronic pain: Secondary | ICD-10-CM | POA: Insufficient documentation

## 2014-07-12 DIAGNOSIS — F419 Anxiety disorder, unspecified: Secondary | ICD-10-CM | POA: Diagnosis not present

## 2014-07-12 DIAGNOSIS — M810 Age-related osteoporosis without current pathological fracture: Secondary | ICD-10-CM | POA: Insufficient documentation

## 2014-07-12 DIAGNOSIS — K529 Noninfective gastroenteritis and colitis, unspecified: Secondary | ICD-10-CM | POA: Diagnosis not present

## 2014-07-12 DIAGNOSIS — R112 Nausea with vomiting, unspecified: Secondary | ICD-10-CM

## 2014-07-12 DIAGNOSIS — K279 Peptic ulcer, site unspecified, unspecified as acute or chronic, without hemorrhage or perforation: Secondary | ICD-10-CM | POA: Insufficient documentation

## 2014-07-12 DIAGNOSIS — M47816 Spondylosis without myelopathy or radiculopathy, lumbar region: Secondary | ICD-10-CM | POA: Diagnosis not present

## 2014-07-12 DIAGNOSIS — Z8709 Personal history of other diseases of the respiratory system: Secondary | ICD-10-CM | POA: Insufficient documentation

## 2014-07-12 DIAGNOSIS — M7138 Other bursal cyst, other site: Secondary | ICD-10-CM | POA: Diagnosis not present

## 2014-07-12 LAB — CBC WITH DIFFERENTIAL/PLATELET
BASOS PCT: 0 % (ref 0–1)
Basophils Absolute: 0 10*3/uL (ref 0.0–0.1)
EOS ABS: 0 10*3/uL (ref 0.0–0.7)
EOS PCT: 0 % (ref 0–5)
HCT: 39.3 % (ref 36.0–46.0)
Hemoglobin: 13.4 g/dL (ref 12.0–15.0)
LYMPHS ABS: 1.7 10*3/uL (ref 0.7–4.0)
LYMPHS PCT: 10 % — AB (ref 12–46)
MCH: 29.8 pg (ref 26.0–34.0)
MCHC: 34.1 g/dL (ref 30.0–36.0)
MCV: 87.5 fL (ref 78.0–100.0)
MONO ABS: 0.7 10*3/uL (ref 0.1–1.0)
MONOS PCT: 4 % (ref 3–12)
NEUTROS PCT: 86 % — AB (ref 43–77)
Neutro Abs: 14.9 10*3/uL — ABNORMAL HIGH (ref 1.7–7.7)
PLATELETS: 183 10*3/uL (ref 150–400)
RBC: 4.49 MIL/uL (ref 3.87–5.11)
RDW: 13.8 % (ref 11.5–15.5)
WBC: 17.2 10*3/uL — AB (ref 4.0–10.5)

## 2014-07-12 LAB — I-STAT TROPONIN, ED: TROPONIN I, POC: 0 ng/mL (ref 0.00–0.08)

## 2014-07-12 MED ORDER — PANTOPRAZOLE SODIUM 40 MG IV SOLR
40.0000 mg | Freq: Once | INTRAVENOUS | Status: AC
  Administered 2014-07-13: 40 mg via INTRAVENOUS
  Filled 2014-07-12: qty 40

## 2014-07-12 MED ORDER — ONDANSETRON 4 MG PO TBDP
4.0000 mg | ORAL_TABLET | Freq: Once | ORAL | Status: AC
  Administered 2014-07-12: 4 mg via ORAL

## 2014-07-12 MED ORDER — GI COCKTAIL ~~LOC~~: 30.0000 mL | Freq: Once | ORAL | Status: DC

## 2014-07-12 MED ORDER — MORPHINE SULFATE 4 MG/ML IJ SOLN
4.0000 mg | Freq: Once | INTRAMUSCULAR | Status: AC
  Administered 2014-07-13: 4 mg via INTRAVENOUS
  Filled 2014-07-12: qty 1

## 2014-07-12 MED ORDER — LORAZEPAM 2 MG/ML IJ SOLN
1.0000 mg | Freq: Once | INTRAMUSCULAR | Status: AC
  Administered 2014-07-13: 1 mg via INTRAVENOUS
  Filled 2014-07-12: qty 1

## 2014-07-12 MED ORDER — SODIUM CHLORIDE 0.9 % IV BOLUS (SEPSIS)
1000.0000 mL | Freq: Once | INTRAVENOUS | Status: AC
  Administered 2014-07-13: 1000 mL via INTRAVENOUS

## 2014-07-12 NOTE — ED Provider Notes (Signed)
CSN: [id number]     Arrival date & time 07/12/14  2222 History   First MD Initiated Contact with Patient 07/12/14 2315     No chief complaint on file.    (Consider location/radiation/quality/duration/timing/severity/associated sxs/prior Treatment) HPI Comments: Tracey Daniel is a 34 y.o. female with a PMHx of chronic migraines, osteoporosis, sinusitis, tobacco use, chronic back pain, vitamin D deficiency, gastritis, gastroparesis, pyloric spasm, gastric outlet obstruction, COPD, marijuana abuse, PUD, anemia, depression, chronic abd pain, and chronic n/v, with a PSHx of cholecystectomy, esophageal dilation, and esophageal botox injection, who presents to the ED with complaints of epigastric abdominal pain that began around 4 PM after she drank 1 beer. She was pain is 10/10 constant throbbing radiating to her back, worse with movement, with no known alleviating factors given that she is not tried anything prior to arrival. She endorses a proximally 10 episodes of nonbloody nonbilious emesis, ongoing nausea, and one episode of diarrhea earlier today. She denies any fevers, chills, chest pain, shortness breath, constipation, obstipation, melanoma, hematochezia, hematemesis, dysuria, hematuria, flank pain, vaginal bleeding or discharge, numbness, tingling, weakness, recent travel, suspicious food intake, sick contacts, or NSAIDs use. Denies smoking marijuana today.  Patient is a 34 y.o. female presenting with abdominal pain. The history is provided by the patient. No language interpreter was used.  Abdominal Pain Pain location:  Epigastric Pain quality: throbbing   Pain radiates to:  Back Pain severity:  Severe Onset quality:  Gradual Duration:  7 hours Timing:  Constant Progression:  Unchanged Chronicity:  Recurrent Context: alcohol use   Context: not recent travel, not sick contacts and not suspicious food intake   Relieved by:  None tried Worsened by:  Movement Ineffective treatments:   None tried Associated symptoms: diarrhea, nausea and vomiting   Associated symptoms: no chest pain, no chills, no constipation, no dysuria, no fever, no flatus, no hematemesis, no hematochezia, no hematuria, no melena, no shortness of breath, no vaginal bleeding and no vaginal discharge   Risk factors: no NSAID use     Past Medical History  Diagnosis Date  . Migraines   . Osteoporosis   . Seasonal allergies   . Sinusitis   . Back pain, chronic   . Nicotine addiction   . Constipation   . Vitamin D deficiency   . Gastritis   . Gastroparesis   . Pyloric spasm 03/30/2011  . Gastric outlet obstruction   . COPD (chronic obstructive pulmonary disease)   . Asthma   . Substance abuse 2008    marijuana  . Peptic ulcer disease   . Depression 2000    h/o suicidal ideation  . Anemia of other chronic disease 11/29/2012  . Chronic abdominal pain   . Nausea and vomiting     recurrent   Past Surgical History  Procedure Laterality Date  . Cholecystectomy      ?2002  . Laser surgery on cervix    . Carpal tunnel release      left hand  . Esophagogastroduodenoscopy  12/25/2010    Dorothyann Peng, MD;  moderate gastritis, ?goo secondary to pylorspasm. BX showed reactive gstropathy no h.pyori, SB mucosa with intramucosal lymphocytosis and partial villous blunting (TTG 4.0 normal)  . Esophageal dilation  12/25/2010    Procedure: ESOPHAGEAL DILATION;  Surgeon: Dorothyann Peng, MD;  Location: [facility];  Service: Endoscopy;;  . Esophagogastroduodenoscopy N/A 11/14/2012    YSA:YTKZS hiatal hernia. Abnormal gastric mucosa of  uncertain significance-status post biopsy. Subjectively, patient may  have recurrent symptomatic, pylorospasm.  . Esophagogastroduodenoscopy (egd) with propofol N/A 02/09/2013    elongated stomach, partial lower esophageal ring widely patent. No obvious pyloric stenosis s/p Botox  . Botox injection N/A 02/09/2013    Procedure: BOTOX INJECTION;  Surgeon: Missy Sabins, MD;   Location: [facility];  Service: Endoscopy;  Laterality: N/A;  possible balloon  . Balloon dilation N/A 02/09/2013    Procedure: BALLOON DILATION;  Surgeon: Missy Sabins, MD;  Location: [facility];  Service: Endoscopy;  Laterality: N/A;   Family History  Problem Relation Age of Onset  . Diabetes Father   . Liver disease Father     liver transplant at [facility], age 23  . Colon cancer Neg Hx   . Lung cancer Mother 27  . Heart disease Maternal Grandmother   . Parkinson's disease Maternal Grandfather   . Multiple sclerosis Sister 35  . Depression Sister 16    bipolar and schizophrenic  . Alcohol abuse Sister    History  Substance Use Topics  . Smoking status: Current Every Day Smoker -- 0.50 packs/day for 15 years    Types: Cigarettes  . Smokeless tobacco: Never Used     Comment: quit 2011  . Alcohol Use: No     Comment: none since July. Periodic heavy drinker for months at a time.   OB History    No data available     Review of Systems  Constitutional: Negative for fever and chills.  Respiratory: Negative for shortness of breath.   Cardiovascular: Negative for chest pain.  Gastrointestinal: Positive for nausea, vomiting, abdominal pain and diarrhea. Negative for constipation, blood in stool, melena, hematochezia, flatus and hematemesis.  Genitourinary: Negative for dysuria, hematuria, flank pain, vaginal bleeding and vaginal discharge.  Musculoskeletal: Positive for back pain (radiating from epigastrum). Negative for myalgias and arthralgias.  Skin: Negative for color change.  Allergic/Immunologic: Negative for immunocompromised state.  Neurological: Negative for weakness and numbness.  Psychiatric/Behavioral: Negative for confusion.   10 Systems reviewed and are negative for acute change except as noted in the HPI.    Allergies  Benadryl; Latex; and Penicillins  Home Medications   Prior to Admission medications   Medication Sig Start Date End Date Taking? Authorizing  Provider  albuterol (PROVENTIL HFA;VENTOLIN HFA) 108 (90 BASE) MCG/ACT inhaler Inhale 2 puffs into the lungs every 6 (six) hours as needed. Shortness of breath Patient taking differently: Inhale 2 puffs into the lungs every 6 (six) hours as needed for wheezing or shortness of breath. Shortness of breath 11/14/12 05/27/15  [name], MD  budesonide-formoterol [facility]) 80-4.5 MCG/ACT inhaler Inhale 2 puffs into the lungs 2 (two) times daily. Patient taking differently: Inhale 2 puffs into the lungs every evening.  11/14/12 05/27/15  [name], MD  calcium-vitamin D (OSCAL 500/200 D-3) 500-200 MG-UNIT per tablet Take 1 tablet by mouth 2 (two) times daily. 11/14/12 02/26/14  [name], MD  Cholecalciferol (VITAMIN D3) 2000 UNITS CHEW Chew 1 capsule by mouth daily.     Historical Provider, MD  clobetasol cream (TEMOVATE) 2.42 % Apply 1 application topically 2 (two) times daily as needed (rash). Apply to rash    Historical Provider, MD  DULoxetine (CYMBALTA) 30 MG capsule Take 1 capsule (30 mg total) by mouth daily. 05/16/14   [name], MD  ergocalciferol (VITAMIN D2) 50000 UNITS capsule Take 1 capsule (50,000 Units total) by mouth once a week. One capsule once weekly 05/16/14   [name], MD  estradiol (ESTRACE VAGINAL) 0.1 MG/GM vaginal cream Place 2 g vaginally at bedtime. As directed    Historical Provider, MD  feeding supplement (ENSURE CLINICAL STRENGTH) LIQD Take 237 mLs by mouth 3 (three) times daily with meals. 01/01/11   Nishant Dhungel, MD  ferrous sulfate 325 (65 FE) MG tablet Take 325 mg by mouth daily with breakfast.    Historical Provider, MD  fluticasone (FLONASE) 50 MCG/ACT nasal spray Place 2 sprays into both nostrils daily.    Historical Provider, MD  gabapentin (NEURONTIN) 100 MG capsule Take 100 mg by mouth 3 (three) times daily.    Historical Provider, MD  loratadine (CLARITIN) 10 MG tablet Take 1 tablet (10 mg total) by mouth daily. 10/09/11    [name], MD  mometasone (NASONEX) 50 MCG/ACT nasal spray Place 2 sprays into the nose 2 (two) times daily. 05/16/14   [name], MD  Multiple Vitamin (MULTIVITAMIN WITH MINERALS) TABS tablet Take 1 tablet by mouth daily.    Historical Provider, MD  ondansetron (ZOFRAN) 4 MG tablet Take 1 tablet (4 mg total) by mouth every 6 (six) hours as needed for nausea or vomiting. 07/06/14   Mahala Menghini, PA-C  oxyCODONE (OXY IR/ROXICODONE) 5 MG immediate release tablet Take 1 tablet (5 mg total) by mouth every 4 (four) hours as needed for moderate pain. 02/28/14   Geradine Girt, DO  pantoprazole (PROTONIX) 40 MG tablet Take 1 tablet (40 mg total) by mouth 2 (two) times daily before a meal. 10/19/13   Mahala Menghini, PA-C  simvastatin (ZOCOR) 20 MG tablet Take 20 mg by mouth daily.    Historical Provider, MD  sucralfate (CARAFATE) 1 GM/10ML suspension Take 10 mLs (1 g total) by mouth 4 (four) times daily -  with meals and at bedtime. 10/19/13   Mahala Menghini, PA-C  tiZANidine (ZANAFLEX) 4 MG tablet Take 4 mg by mouth every 6 (six) hours as needed for muscle spasms.  10/27/13   Historical Provider, MD   BP 123/79 mmHg  Pulse 61  Temp(Src) 97.6 F (36.4 C) (Oral)  Resp 16  Ht 5\' 6"  (1.676 m)  Wt 137 lb 6.4 oz (62.324 kg)  BMI 22.19 kg/m2  SpO2 100%  LMP 01/19/1997 Physical Exam  Constitutional: She is oriented to person, place, and time. Vital signs are normal. She appears well-developed and well-nourished.  Non-toxic appearance. She appears distressed.  Afebrile, nontoxic, crying and hyperventilating, crawling around on bed  HENT:  Head: Normocephalic and atraumatic.  Mouth/Throat: Oropharynx is clear and moist. Mucous membranes are dry.  Mildly dry mucous membranes  Eyes: Conjunctivae and EOM are normal. Right eye exhibits no discharge. Left eye exhibits no discharge.  Neck: Normal range of motion. Neck supple.  Cardiovascular: Normal rate, regular rhythm, normal heart sounds and  intact distal pulses.  Exam reveals no gallop and no friction rub.   No murmur heard. RRR, nl s1/s2, no m/r/g, distal pulses intact, no pedal edema   Pulmonary/Chest: Effort normal and breath sounds normal. No respiratory distress. She has no decreased breath sounds. She has no wheezes. She has no rhonchi. She has no rales.  CTAB in all lung fields, no w/r/r, no hypoxia or increased WOB although pt hyperventilating when she gets upset then calms down, speaking in full sentences, SpO2 100% on RA   Abdominal: Soft. Normal appearance and bowel sounds are normal. She exhibits no distension. There is tenderness in the epigastric area. There is no rigidity, no rebound, no  guarding, no CVA tenderness, no tenderness at McBurney's point and negative Murphy's sign.  Soft, nondistended, +BS throughout, with exquisite epigastric TTP, no r/g/r, neg murphy's, neg mcburney's, no CVA TTP   Musculoskeletal: Normal range of motion.  MAE x4 Strength and sensation grossly intact Distal pulses intact No pedal edema  Neurological: She is alert and oriented to person, place, and time. She has normal strength. No sensory deficit.  Skin: Skin is warm, dry and intact. No rash noted.  Psychiatric: Her mood appears anxious.  Anxious, hyperventilating but redirectable and able to calm down if asked  Nursing note and vitals reviewed.   ED Course  Procedures (including critical care time) Labs Review Labs Reviewed  CBC WITH DIFFERENTIAL/PLATELET - Abnormal; Notable for the following:    WBC 17.2 (*)    Neutrophils Relative % 86 (*)    Neutro Abs 14.9 (*)    Lymphocytes Relative 10 (*)    All other components within normal limits  HEPATIC FUNCTION PANEL - Abnormal; Notable for the following:    Bilirubin, Direct <0.1 (*)    All other components within normal limits  LIPASE, BLOOD  URINALYSIS, ROUTINE W REFLEX MICROSCOPIC (NOT AT [facility])  GI PATHOGEN PANEL BY PCR, STOOL  I-STAT CHEM 8, ED  I-STAT TROPOININ, ED     Imaging Review Dg Abd Acute W/chest  07/13/2014   CLINICAL DATA:  Acute onset of epigastric abdominal pain. Initial encounter.  EXAM: DG ABDOMEN ACUTE W/ 1V CHEST  COMPARISON:  Chest radiograph performed 03/25/2014, and abdominal radiograph performed 11/06/2013  FINDINGS: The lungs are well-aerated and clear. There is no evidence of focal opacification, pleural effusion or pneumothorax. The cardiomediastinal silhouette is within normal limits.  The visualized bowel gas pattern is unremarkable. Scattered stool and air are seen within the colon; there is no evidence of small bowel dilatation to suggest obstruction. No free intra-abdominal air is identified on the provided upright view. Clips are noted within the right upper quadrant, reflecting prior cholecystectomy.  No acute osseous abnormalities are seen; the sacroiliac joints are unremarkable in appearance.  IMPRESSION: 1. Unremarkable bowel gas pattern; no free intra-abdominal air seen. 2. No acute cardiopulmonary process seen.   Electronically Signed   By: Garald Balding M.D.   On: 07/13/2014 01:10     EKG Interpretation   Date/Time:  Thursday July 12 2014 22:41:28 EDT Ventricular Rate:  66 PR Interval:  146 QRS Duration: 94 QT Interval:  410 QTC Calculation: 429 R Axis:   92 Text Interpretation:  Normal sinus rhythm with sinus arrhythmia Rightward  axis T wave abnormality, consider inferolateral ischemia Abnormal ECG T  wave inversion more pronounced than prior EKG 04/12/2022 Confirmed by OTTER  MD,  OLGA ([id number]) on 07/12/2014 11:11:51 PM      MDM   Final diagnoses:  Epigastric abdominal pain  Nausea vomiting and diarrhea  Gastroenteritis  Gastritis    34 y.o. female here with epigastric abd pain, n/v/d onset this afternoon after drinking beer. Pt very dramatic in the room, hyperventilating, but calms down when asked to take deep breaths. She denies CP/SOB to me, although this was mentioned in the nursing triage note. On exam,  tenderness in epigastrum. Pt with hx of multiple gastric issues including gastric outlet obstruction. Will obtain labs, u/a, acute abd series to eval for obstruction/perf, and give GI cocktail, protonix, morphine, and ativan as well as fluids. Will reassess shortly.   1:18 AM Pt just now received her meds and started getting fluids. Pt  currently sleeping. Trop neg, CBC w/diff showing leukocytosis of 17.2 but could be demargination from vomiting. LFTs WNL. Lipase WNL. Acute abd series unremarkable. U/A still pending. Upon reassessment, nursing informs me she's had two more diarrhea episodes, making gastroenteritis a definite possibility. Will attempt to collect next stool. Since pt just now medicated and still awaiting urine, will sign out pt to overnight PA [facility] Plan is to await U/A, then attempt to PO challenge. if she can't PO challenge will need admission for intractable n/v. Please see her note for further documentation of care.  BP 123/79 mmHg  Pulse 61  Temp(Src) 97.6 F (36.4 C) (Oral)  Resp 16  Ht 5\' 6"  (1.676 m)  Wt 137 lb 6.4 oz (62.324 kg)  BMI 22.19 kg/m2  SpO2 100%  LMP 01/19/1997  Meds ordered this encounter  Medications  . ondansetron (ZOFRAN-ODT) disintegrating tablet 4 mg    Sig:   . LORazepam (ATIVAN) injection 1 mg    Sig:   . sodium chloride 0.9 % bolus 1,000 mL    Sig:   . morphine 4 MG/ML injection 4 mg    Sig:   . pantoprazole (PROTONIX) injection 40 mg    Sig:   . gi cocktail (Maalox,Lidocaine,Donnatal)    Sig:   . ondansetron (ZOFRAN) injection 4 mg    Sig:      Felipa Laroche Camprubi-Soms, PA-C 07/13/14 0129  Davonna Belling, MD 07/15/14 1539

## 2014-07-12 NOTE — ED Notes (Addendum)
Pt st's she was at neurologist office today "to have fluid removed from a cyst on her lower back"  Pt st's tonight pain in back has increased, also c/o epigastric pain with nausea and vomiting.  Pt also c/o chest pain. Unable to sit up in triage

## 2014-07-13 ENCOUNTER — Emergency Department (HOSPITAL_COMMUNITY): Payer: Medicare Other

## 2014-07-13 DIAGNOSIS — R1013 Epigastric pain: Secondary | ICD-10-CM | POA: Diagnosis not present

## 2014-07-13 DIAGNOSIS — K529 Noninfective gastroenteritis and colitis, unspecified: Secondary | ICD-10-CM | POA: Diagnosis not present

## 2014-07-13 LAB — HEPATIC FUNCTION PANEL
ALT: 18 U/L (ref 14–54)
AST: 31 U/L (ref 15–41)
Albumin: 4.7 g/dL (ref 3.5–5.0)
Alkaline Phosphatase: 78 U/L (ref 38–126)
Total Bilirubin: 0.5 mg/dL (ref 0.3–1.2)
Total Protein: 8 g/dL (ref 6.5–8.1)

## 2014-07-13 LAB — LIPASE, BLOOD: LIPASE: 22 U/L (ref 22–51)

## 2014-07-13 MED ORDER — ONDANSETRON 4 MG PO TBDP: 4.0000 mg | ORAL_TABLET | Freq: Three times a day (TID) | ORAL | Status: DC | PRN

## 2014-07-13 MED ORDER — ONDANSETRON HCL 4 MG/2ML IJ SOLN
4.0000 mg | Freq: Once | INTRAMUSCULAR | Status: AC
  Administered 2014-07-13: 4 mg via INTRAVENOUS
  Filled 2014-07-13: qty 2

## 2014-07-13 MED ORDER — DICYCLOMINE HCL 20 MG PO TABS: 20.0000 mg | ORAL_TABLET | Freq: Two times a day (BID) | ORAL | Status: DC

## 2014-07-13 NOTE — ED Provider Notes (Signed)
1:25 AM Patient signed out to be by [employer], PA-C. Patient pending PO challenge.   2:44 AM Patient feeling better. She is able to tolerate liquids PO. Patient will be discharged with zofran and bentyl for symptoms. Vitals stable and patient afebrile.   Results for orders placed or performed during the hospital encounter of 07/12/14  CBC with Differential  Result Value Ref Range   WBC 17.2 (H) 4.0 - 10.5 K/uL   RBC 4.49 3.87 - 5.11 MIL/uL   Hemoglobin 13.4 12.0 - 15.0 g/dL   HCT 39.3 36.0 - 46.0 %   MCV 87.5 78.0 - 100.0 fL   MCH 29.8 26.0 - 34.0 pg   MCHC 34.1 30.0 - 36.0 g/dL   RDW 13.8 11.5 - 15.5 %   Platelets 183 150 - 400 K/uL   Neutrophils Relative % 86 (H) 43 - 77 %   Neutro Abs 14.9 (H) 1.7 - 7.7 K/uL   Lymphocytes Relative 10 (L) 12 - 46 %   Lymphs Abs 1.7 0.7 - 4.0 K/uL   Monocytes Relative 4 3 - 12 %   Monocytes Absolute 0.7 0.1 - 1.0 K/uL   Eosinophils Relative 0 0 - 5 %   Eosinophils Absolute 0.0 0.0 - 0.7 K/uL   Basophils Relative 0 0 - 1 %   Basophils Absolute 0.0 0.0 - 0.1 K/uL  Hepatic function panel  Result Value Ref Range   Total Protein 8.0 6.5 - 8.1 g/dL   Albumin 4.7 3.5 - 5.0 g/dL   AST 31 15 - 41 U/L   ALT 18 14 - 54 U/L   Alkaline Phosphatase 78 38 - 126 U/L   Total Bilirubin 0.5 0.3 - 1.2 mg/dL   Bilirubin, Direct <0.1 (L) 0.1 - 0.5 mg/dL   Indirect Bilirubin NOT CALCULATED 0.3 - 0.9 mg/dL  Lipase, blood  Result Value Ref Range   Lipase 22 22 - 51 U/L  I-Stat Troponin, ED (not at [facility])  Result Value Ref Range   Troponin i, poc 0.00 0.00 - 0.08 ng/mL   Comment 3           Mr Lumbar Spine Wo Contrast  06/21/2014   CLINICAL DATA:  Lumbago/low back pain radiating to the RIGHT leg. Chronic increasing RIGHT-sided hip and lumbago/low back pain.  EXAM: MRI LUMBAR SPINE WITHOUT CONTRAST  TECHNIQUE: Multiplanar, multisequence MR imaging of the lumbar spine was performed. No intravenous contrast was administered.  COMPARISON:  CT 03/29/2011.   FINDINGS: Segmentation: The numbering convention used for this exam termed L5-S1 as the last intervertebral disc space.  Alignment: There is a mild levoconvex curve of the lumbar spine with the apex at L3.  Vertebrae:  Normal.  Conus medullaris: Normal at L1.  Paraspinal tissues: Normal.  Disc levels:  Disc Signal: Normal.  T11-T12 through L3-L4 intervertebral discs and facet joints are normal.  L4-L5: The disc is normal. There is no stenosis. There is a RIGHT posterior extra-spinal synovial cyst measuring 13 mm x 7 mm. Mild degeneration of the RIGHT facet joint. The LEFT facet joint appears normal.  L5-S1:  Normal.  IMPRESSION: Single level RIGHT L4-L5 facet degeneration with the posterior extra-spinal synovial cyst. The synovial cysts arise from facet joint degeneration. In this patient with RIGHT-sided symptoms, referred pain associated with RIGHT L4-L5 facet arthritis merits consideration.   Electronically Signed   By: Dereck Ligas M.D.   On: 06/21/2014 09:34   Dg Abd Acute W/chest  07/13/2014   CLINICAL DATA:  Acute  onset of epigastric abdominal pain. Initial encounter.  EXAM: DG ABDOMEN ACUTE W/ 1V CHEST  COMPARISON:  Chest radiograph performed 03/25/2014, and abdominal radiograph performed 11/06/2013  FINDINGS: The lungs are well-aerated and clear. There is no evidence of focal opacification, pleural effusion or pneumothorax. The cardiomediastinal silhouette is within normal limits.  The visualized bowel gas pattern is unremarkable. Scattered stool and air are seen within the colon; there is no evidence of small bowel dilatation to suggest obstruction. No free intra-abdominal air is identified on the provided upright view. Clips are noted within the right upper quadrant, reflecting prior cholecystectomy.  No acute osseous abnormalities are seen; the sacroiliac joints are unremarkable in appearance.  IMPRESSION: 1. Unremarkable bowel gas pattern; no free intra-abdominal air seen. 2. No acute  cardiopulmonary process seen.   Electronically Signed   By: Garald Balding M.D.   On: 07/13/2014 01:10      Alvina Chou, PA-C 07/13/14 0244  Linton Flemings, MD 07/13/14 [phone]

## 2014-07-13 NOTE — Discharge Instructions (Signed)
Take zofran as needed for nausea. Take bentyl as needed for abdominal pain. Refer to attached documents for more information.

## 2014-07-13 NOTE — ED Notes (Signed)
Pt given Sprite for PO challenge.

## 2014-07-13 NOTE — ED Notes (Signed)
Pt in x-ray at this time

## 2014-07-13 NOTE — ED Notes (Signed)
Pt to bathroom via wheelchair.  Pt having diarrhea stools

## 2014-07-28 ENCOUNTER — Encounter (HOSPITAL_COMMUNITY): Payer: Self-pay | Admitting: *Deleted

## 2014-07-28 ENCOUNTER — Emergency Department (HOSPITAL_COMMUNITY): Payer: Medicare Other

## 2014-07-28 ENCOUNTER — Emergency Department (HOSPITAL_COMMUNITY)
Admission: EM | Admit: 2014-07-28 | Discharge: 2014-07-28 | Disposition: A | Discharge status: Home / Self Care | Payer: Medicare Other | Attending: Emergency Medicine | Admitting: Emergency Medicine

## 2014-07-28 DIAGNOSIS — Z91040 Latex allergy status: Secondary | ICD-10-CM | POA: Insufficient documentation

## 2014-07-28 DIAGNOSIS — E559 Vitamin D deficiency, unspecified: Secondary | ICD-10-CM | POA: Insufficient documentation

## 2014-07-28 DIAGNOSIS — M810 Age-related osteoporosis without current pathological fracture: Secondary | ICD-10-CM | POA: Diagnosis not present

## 2014-07-28 DIAGNOSIS — R6883 Chills (without fever): Secondary | ICD-10-CM | POA: Insufficient documentation

## 2014-07-28 DIAGNOSIS — Z3202 Encounter for pregnancy test, result negative: Secondary | ICD-10-CM | POA: Insufficient documentation

## 2014-07-28 DIAGNOSIS — R109 Unspecified abdominal pain: Secondary | ICD-10-CM

## 2014-07-28 DIAGNOSIS — J449 Chronic obstructive pulmonary disease, unspecified: Secondary | ICD-10-CM | POA: Diagnosis not present

## 2014-07-28 DIAGNOSIS — R630 Anorexia: Secondary | ICD-10-CM | POA: Diagnosis not present

## 2014-07-28 DIAGNOSIS — Z7951 Long term (current) use of inhaled steroids: Secondary | ICD-10-CM | POA: Insufficient documentation

## 2014-07-28 DIAGNOSIS — Z880 Allergy status to penicillin: Secondary | ICD-10-CM | POA: Diagnosis not present

## 2014-07-28 DIAGNOSIS — Z720 Tobacco use: Secondary | ICD-10-CM | POA: Diagnosis not present

## 2014-07-28 DIAGNOSIS — G8929 Other chronic pain: Secondary | ICD-10-CM | POA: Diagnosis not present

## 2014-07-28 DIAGNOSIS — R112 Nausea with vomiting, unspecified: Secondary | ICD-10-CM | POA: Insufficient documentation

## 2014-07-28 DIAGNOSIS — R197 Diarrhea, unspecified: Secondary | ICD-10-CM | POA: Insufficient documentation

## 2014-07-28 DIAGNOSIS — Z9049 Acquired absence of other specified parts of digestive tract: Secondary | ICD-10-CM | POA: Diagnosis not present

## 2014-07-28 DIAGNOSIS — D649 Anemia, unspecified: Secondary | ICD-10-CM | POA: Diagnosis not present

## 2014-07-28 DIAGNOSIS — K2970 Gastritis, unspecified, without bleeding: Secondary | ICD-10-CM | POA: Insufficient documentation

## 2014-07-28 DIAGNOSIS — G43909 Migraine, unspecified, not intractable, without status migrainosus: Secondary | ICD-10-CM | POA: Diagnosis not present

## 2014-07-28 DIAGNOSIS — Z79899 Other long term (current) drug therapy: Secondary | ICD-10-CM | POA: Insufficient documentation

## 2014-07-28 DIAGNOSIS — R1010 Upper abdominal pain, unspecified: Secondary | ICD-10-CM | POA: Diagnosis not present

## 2014-07-28 DIAGNOSIS — R1084 Generalized abdominal pain: Secondary | ICD-10-CM | POA: Diagnosis not present

## 2014-07-28 LAB — CBC WITH DIFFERENTIAL/PLATELET
BASOS PCT: 0 % (ref 0–1)
Basophils Absolute: 0 10*3/uL (ref 0.0–0.1)
Eosinophils Absolute: 0 10*3/uL (ref 0.0–0.7)
Eosinophils Relative: 0 % (ref 0–5)
HEMATOCRIT: 37.9 % (ref 36.0–46.0)
Hemoglobin: 12.3 g/dL (ref 12.0–15.0)
Lymphocytes Relative: 11 % — ABNORMAL LOW (ref 12–46)
Lymphs Abs: 1.9 10*3/uL (ref 0.7–4.0)
MCH: 29.2 pg (ref 26.0–34.0)
MCHC: 32.5 g/dL (ref 30.0–36.0)
MCV: 90 fL (ref 78.0–100.0)
MONO ABS: 0.9 10*3/uL (ref 0.1–1.0)
Monocytes Relative: 5 % (ref 3–12)
NEUTROS ABS: 13.9 10*3/uL — AB (ref 1.7–7.7)
NEUTROS PCT: 84 % — AB (ref 43–77)
PLATELETS: 229 10*3/uL (ref 150–400)
RBC: 4.21 MIL/uL (ref 3.87–5.11)
RDW: 14.2 % (ref 11.5–15.5)
WBC: 16.6 10*3/uL — AB (ref 4.0–10.5)

## 2014-07-28 LAB — URINALYSIS, ROUTINE W REFLEX MICROSCOPIC
Bilirubin Urine: NEGATIVE
GLUCOSE, UA: NEGATIVE mg/dL
Hgb urine dipstick: NEGATIVE
KETONES UR: NEGATIVE mg/dL
Leukocytes, UA: NEGATIVE
Nitrite: NEGATIVE
PROTEIN: 30 mg/dL — AB
Specific Gravity, Urine: 1.025 (ref 1.005–1.030)
Urobilinogen, UA: 0.2 mg/dL (ref 0.0–1.0)
pH: 8.5 — ABNORMAL HIGH (ref 5.0–8.0)

## 2014-07-28 LAB — URINE MICROSCOPIC-ADD ON

## 2014-07-28 LAB — COMPREHENSIVE METABOLIC PANEL
ALT: 17 U/L (ref 14–54)
AST: 27 U/L (ref 15–41)
Albumin: 4.7 g/dL (ref 3.5–5.0)
Alkaline Phosphatase: 71 U/L (ref 38–126)
Anion gap: 13 (ref 5–15)
BUN: 21 mg/dL — ABNORMAL HIGH (ref 6–20)
CO2: 24 mmol/L (ref 22–32)
CREATININE: 0.89 mg/dL (ref 0.44–1.00)
Calcium: 10 mg/dL (ref 8.9–10.3)
Chloride: 104 mmol/L (ref 101–111)
GFR calc Af Amer: >60 mL/min (ref >60)
GFR calc non Af Amer: >60 mL/min (ref >60)
GLUCOSE: 135 mg/dL — AB (ref 65–99)
Potassium: 4 mmol/L (ref 3.5–5.1)
SODIUM: 141 mmol/L (ref 135–145)
TOTAL PROTEIN: 8 g/dL (ref 6.5–8.1)
Total Bilirubin: 0.6 mg/dL (ref 0.3–1.2)

## 2014-07-28 LAB — PREGNANCY, URINE: Preg Test, Ur: NEGATIVE

## 2014-07-28 LAB — LIPASE, BLOOD: Lipase: 21 U/L — ABNORMAL LOW (ref 22–51)

## 2014-07-28 MED ORDER — ONDANSETRON HCL 4 MG/2ML IJ SOLN
4.0000 mg | Freq: Once | INTRAMUSCULAR | Status: AC
  Administered 2014-07-28: 4 mg via INTRAVENOUS
  Filled 2014-07-28: qty 2

## 2014-07-28 MED ORDER — LORAZEPAM 2 MG/ML IJ SOLN
1.0000 mg | Freq: Once | INTRAMUSCULAR | Status: AC
  Administered 2014-07-28: 1 mg via INTRAVENOUS
  Filled 2014-07-28: qty 1

## 2014-07-28 MED ORDER — PROMETHAZINE HCL 25 MG RE SUPP: 25.0000 mg | Freq: Four times a day (QID) | RECTAL | Status: DC | PRN

## 2014-07-28 MED ORDER — PROMETHAZINE HCL 25 MG/ML IJ SOLN
25.0000 mg | Freq: Once | INTRAMUSCULAR | Status: AC
  Administered 2014-07-28: 25 mg via INTRAVENOUS
  Filled 2014-07-28: qty 1

## 2014-07-28 MED ORDER — HYDROMORPHONE HCL 1 MG/ML IJ SOLN
1.0000 mg | Freq: Once | INTRAMUSCULAR | Status: AC
Start: 2014-07-28 — End: 2014-07-28
  Administered 2014-07-28: 1 mg via INTRAVENOUS
  Filled 2014-07-28: qty 1

## 2014-07-28 MED ORDER — METOCLOPRAMIDE HCL 5 MG/ML IJ SOLN
10.0000 mg | Freq: Once | INTRAMUSCULAR | Status: AC
  Administered 2014-07-28: 10 mg via INTRAVENOUS
  Filled 2014-07-28: qty 2

## 2014-07-28 MED ORDER — SODIUM CHLORIDE 0.9 % IV BOLUS (SEPSIS)
1000.0000 mL | Freq: Once | INTRAVENOUS | Status: AC
  Administered 2014-07-28: 1000 mL via INTRAVENOUS

## 2014-07-28 NOTE — ED Provider Notes (Signed)
Care transferred from Dr. Alvino Chapel on the fifth 34 year old with recurrent epigastric pain, nausea, vomiting and diarrhea.  Patient has been given antiemetics and IV fluids.  She was sleeping when I walked into the room, however, then began moaning in bed and forcefully retching.  Acute abdominal series will be ordered.  Patient was given Reglan and a dose of Dilaudid for symptom control.  She is a leukocytosis of 16.6, which is similar from 2 weeks ago.  Urine does not appear infected.  CMP is grossly normal  AAS nml. Pt feeling improved and tolerating PO. Will d/c home.   1. Nausea vomiting and diarrhea   2. Chronic abdominal pain      Ernestina Patches, MD 07/28/14 [phone]

## 2014-07-28 NOTE — ED Notes (Signed)
Patient transported to X-ray 

## 2014-07-28 NOTE — ED Notes (Signed)
Provided pt with Sprite and crackers for PO challenge.

## 2014-07-28 NOTE — ED Provider Notes (Signed)
CSN: [id number]     Arrival date & time 07/28/14  1324 History   First MD Initiated Contact with Patient 07/28/14 1402     Chief Complaint  Patient presents with  . Emesis  . Diarrhea  . Abdominal Pain     (Consider location/radiation/quality/duration/timing/severity/associated sxs/prior Treatment) Patient is a 34 y.o. female presenting with vomiting, diarrhea, and abdominal pain. The history is provided by the patient.  Emesis Associated symptoms: abdominal pain, chills and diarrhea   Associated symptoms: no headaches   Diarrhea Associated symptoms: abdominal pain, chills and vomiting   Associated symptoms: no headaches   Abdominal Pain Associated symptoms: chills, diarrhea, nausea and vomiting   Associated symptoms: no chest pain and no shortness of breath    patient with nausea vomiting diarrhea. Again this morning. Unable tolerate orals. History of same with previous gastric outlet obstruction chronic abdominal pain. Has had previous Botox injections and celiac blocks that had helped. No fevers. States she is somewhat hungry. States she's had chills. No blood in emesis or diarrhea. Has had similar symptoms in the past. No relief with medications at home. Pain is dull and constant. Is also somewhat crampy.  Past Medical History  Diagnosis Date  . Migraines   . Osteoporosis   . Seasonal allergies   . Sinusitis   . Back pain, chronic   . Nicotine addiction   . Constipation   . Vitamin D deficiency   . Gastritis   . Gastroparesis   . Pyloric spasm 03/30/2011  . Gastric outlet obstruction   . COPD (chronic obstructive pulmonary disease)   . Asthma   . Substance abuse 2008    marijuana  . Peptic ulcer disease   . Depression 2000    h/o suicidal ideation  . Anemia of other chronic disease 11/29/2012  . Chronic abdominal pain   . Nausea and vomiting     recurrent   Past Surgical History  Procedure Laterality Date  . Cholecystectomy      ?2002  . Laser surgery on cervix     . Carpal tunnel release      left hand  . Esophagogastroduodenoscopy  12/25/2010    Dorothyann Peng, MD;  moderate gastritis, ?goo secondary to pylorspasm. BX showed reactive gstropathy no h.pyori, SB mucosa with intramucosal lymphocytosis and partial villous blunting (TTG 4.0 normal)  . Esophageal dilation  12/25/2010    Procedure: ESOPHAGEAL DILATION;  Surgeon: Dorothyann Peng, MD;  Location: [facility];  Service: Endoscopy;;  . Esophagogastroduodenoscopy N/A 11/14/2012    GMW:NUUVO hiatal hernia. Abnormal gastric mucosa of  uncertain significance-status post biopsy. Subjectively, patient may have recurrent symptomatic, pylorospasm.  . Esophagogastroduodenoscopy (egd) with propofol N/A 02/09/2013    elongated stomach, partial lower esophageal ring widely patent. No obvious pyloric stenosis s/p Botox  . Botox injection N/A 02/09/2013    Procedure: BOTOX INJECTION;  Surgeon: Missy Sabins, MD;  Location: [facility];  Service: Endoscopy;  Laterality: N/A;  possible balloon  . Balloon dilation N/A 02/09/2013    Procedure: BALLOON DILATION;  Surgeon: Missy Sabins, MD;  Location: [facility];  Service: Endoscopy;  Laterality: N/A;   Family History  Problem Relation Age of Onset  . Diabetes Father   . Liver disease Father     liver transplant at [facility], age 5  . Colon cancer Neg Hx   . Lung cancer Mother 19  . Heart disease Maternal Grandmother   . Parkinson's disease Maternal Grandfather   . Multiple sclerosis  Sister 69  . Depression Sister 16    bipolar and schizophrenic  . Alcohol abuse Sister    History  Substance Use Topics  . Smoking status: Current Every Day Smoker -- 0.50 packs/day for 15 years    Types: Cigarettes  . Smokeless tobacco: Never Used     Comment: quit 2011  . Alcohol Use: No     Comment: none since July. Periodic heavy drinker for months at a time.   OB History    No data available     Review of Systems  Constitutional: Positive for chills and appetite  change. Negative for activity change.  Eyes: Negative for pain.  Respiratory: Negative for chest tightness and shortness of breath.   Cardiovascular: Negative for chest pain and leg swelling.  Gastrointestinal: Positive for nausea, vomiting, abdominal pain and diarrhea.  Genitourinary: Negative for flank pain.  Musculoskeletal: Negative for back pain and neck stiffness.  Skin: Negative for rash.  Neurological: Negative for weakness, numbness and headaches.  Psychiatric/Behavioral: Negative for behavioral problems.      Allergies  Benadryl; Latex; and Penicillins  Home Medications   Prior to Admission medications   Medication Sig Start Date End Date Taking? Authorizing Provider  albuterol (PROVENTIL HFA;VENTOLIN HFA) 108 (90 BASE) MCG/ACT inhaler Inhale 2 puffs into the lungs every 6 (six) hours as needed. Shortness of breath Patient taking differently: Inhale 2 puffs into the lungs every 6 (six) hours as needed for wheezing or shortness of breath. Shortness of breath 11/14/12 05/27/15  [name], MD  budesonide-formoterol [facility]) 80-4.5 MCG/ACT inhaler Inhale 2 puffs into the lungs 2 (two) times daily. Patient taking differently: Inhale 2 puffs into the lungs every evening.  11/14/12 05/27/15  [name], MD  calcium-vitamin D (OSCAL 500/200 D-3) 500-200 MG-UNIT per tablet Take 1 tablet by mouth 2 (two) times daily. 11/14/12 02/26/14  [name], MD  Cholecalciferol (VITAMIN D3) 2000 UNITS CHEW Chew 1 capsule by mouth daily.     Historical Provider, MD  clobetasol cream (TEMOVATE) 2.70 % Apply 1 application topically 2 (two) times daily as needed (rash). Apply to rash    Historical Provider, MD  dicyclomine (BENTYL) 20 MG tablet Take 1 tablet (20 mg total) by mouth 2 (two) times daily. 07/13/14   Kaitlyn Szekalski, PA-C  DULoxetine (CYMBALTA) 30 MG capsule Take 1 capsule (30 mg total) by mouth daily. 05/16/14   [name], MD  ergocalciferol (VITAMIN D2)  50000 UNITS capsule Take 1 capsule (50,000 Units total) by mouth once a week. One capsule once weekly 05/16/14   [name], MD  estradiol (ESTRACE VAGINAL) 0.1 MG/GM vaginal cream Place 2 g vaginally at bedtime. As directed    Historical Provider, MD  feeding supplement (ENSURE CLINICAL STRENGTH) LIQD Take 237 mLs by mouth 3 (three) times daily with meals. 01/01/11   Nishant Dhungel, MD  ferrous sulfate 325 (65 FE) MG tablet Take 325 mg by mouth daily with breakfast.    Historical Provider, MD  fluticasone (FLONASE) 50 MCG/ACT nasal spray Place 2 sprays into both nostrils daily.    Historical Provider, MD  gabapentin (NEURONTIN) 100 MG capsule Take 100 mg by mouth 3 (three) times daily.    Historical Provider, MD  loratadine (CLARITIN) 10 MG tablet Take 1 tablet (10 mg total) by mouth daily. 10/09/11   [name], MD  mometasone (NASONEX) 50 MCG/ACT nasal spray Place 2 sprays into the nose 2 (two) times daily. 05/16/14   Joycelyn Schmid  Bartholome Bill, MD  Multiple Vitamin (MULTIVITAMIN WITH MINERALS) TABS tablet Take 1 tablet by mouth daily.    Historical Provider, MD  ondansetron (ZOFRAN ODT) 4 MG disintegrating tablet Take 1 tablet (4 mg total) by mouth every 8 (eight) hours as needed for nausea or vomiting. 07/13/14   Alvina Chou, PA-C  ondansetron (ZOFRAN) 4 MG tablet Take 1 tablet (4 mg total) by mouth every 6 (six) hours as needed for nausea or vomiting. 07/06/14   Mahala Menghini, PA-C  oxyCODONE (OXY IR/ROXICODONE) 5 MG immediate release tablet Take 1 tablet (5 mg total) by mouth every 4 (four) hours as needed for moderate pain. 02/28/14   Geradine Girt, DO  pantoprazole (PROTONIX) 40 MG tablet Take 1 tablet (40 mg total) by mouth 2 (two) times daily before a meal. 10/19/13   Mahala Menghini, PA-C  simvastatin (ZOCOR) 20 MG tablet Take 20 mg by mouth daily.    Historical Provider, MD  sucralfate (CARAFATE) 1 GM/10ML suspension Take 10 mLs (1 g total) by mouth 4 (four) times daily -  with  meals and at bedtime. 10/19/13   Mahala Menghini, PA-C  tiZANidine (ZANAFLEX) 4 MG tablet Take 4 mg by mouth every 6 (six) hours as needed for muscle spasms.  10/27/13   Historical Provider, MD   BP 134/64 mmHg  Pulse 61  Temp(Src) 98.1 F (36.7 C) (Oral)  Resp 24  SpO2 100%  LMP 01/19/1997 Physical Exam  Constitutional: She is oriented to person, place, and time. She appears well-developed and well-nourished.  Patient appears uncomfortable  HENT:  Head: Normocephalic and atraumatic.  Eyes: EOM are normal. Pupils are equal, round, and reactive to light.  Neck: Normal range of motion. Neck supple.  Cardiovascular: Normal rate, regular rhythm and normal heart sounds.   No murmur heard. Pulmonary/Chest: Effort normal and breath sounds normal. No respiratory distress. She has no wheezes. She has no rales.  Abdominal: Soft. Bowel sounds are normal. She exhibits no distension. There is tenderness. There is no rebound and no guarding.  Mild tenderness to the upper abdomen without rebound or guarding.  Musculoskeletal: Normal range of motion.  Neurological: She is alert and oriented to person, place, and time. No cranial nerve deficit.  Skin: Skin is warm and dry.  Psychiatric: Her speech is normal.  Nursing note and vitals reviewed.   ED Course  Procedures (including critical care time) Labs Review Labs Reviewed  CBC WITH DIFFERENTIAL/PLATELET - Abnormal; Notable for the following:    WBC 16.6 (*)    Neutrophils Relative % 84 (*)    Neutro Abs 13.9 (*)    Lymphocytes Relative 11 (*)    All other components within normal limits  COMPREHENSIVE METABOLIC PANEL - Abnormal; Notable for the following:    Glucose, Bld 135 (*)    BUN 21 (*)    All other components within normal limits  LIPASE, BLOOD - Abnormal; Notable for the following:    Lipase 21 (*)    All other components within normal limits  URINALYSIS, ROUTINE W REFLEX MICROSCOPIC (NOT AT [facility]) - Abnormal; Notable for the  following:    APPearance CLOUDY (*)    pH 8.5 (*)    Protein, ur 30 (*)    All other components within normal limits  URINE MICROSCOPIC-ADD ON - Abnormal; Notable for the following:    Squamous Epithelial / LPF FEW (*)    Bacteria, UA MANY (*)    All other components within normal limits  PREGNANCY, URINE    Imaging Review No results found.   EKG Interpretation None      MDM   Final diagnoses:  Nausea vomiting and diarrhea  Chronic abdominal pain    Patient with nausea vomiting diarrhea. History of same. Has had chronic abdominal pain that had been relieved by a celiac block. Pain is turned. Lab work overall reassuring but does have an elevated white count. Patient states her nausea improved the pain remains. Will give oral trial.    Davonna Belling, MD 07/28/14 1536

## 2014-07-28 NOTE — ED Notes (Signed)
Pt reports n/v/d since awakening this am. Unable to tolerate PO. Abd pain to mid upper abd, constant.

## 2014-07-28 NOTE — Discharge Instructions (Signed)
Abdominal Pain °Many things can cause abdominal pain. Usually, abdominal pain is not caused by a disease and will improve without treatment. It can often be observed and treated at home. Your health care provider will do a physical exam and possibly order blood tests and X-rays to help determine the seriousness of your pain. However, in many cases, more time must pass before a clear cause of the pain can be found. Before that point, your health care provider may not know if you need more testing or further treatment. °HOME CARE INSTRUCTIONS  °Monitor your abdominal pain for any changes. The following actions may help to alleviate any discomfort you are experiencing: °· Only take over-the-counter or prescription medicines as directed by your health care provider. °· Do not take laxatives unless directed to do so by your health care provider. °· Try a clear liquid diet (broth, tea, or water) as directed by your health care provider. Slowly move to a bland diet as tolerated. °SEEK MEDICAL CARE IF: °· You have unexplained abdominal pain. °· You have abdominal pain associated with nausea or diarrhea. °· You have pain when you urinate or have a bowel movement. °· You experience abdominal pain that wakes you in the night. °· You have abdominal pain that is worsened or improved by eating food. °· You have abdominal pain that is worsened with eating fatty foods. °· You have a fever. °SEEK IMMEDIATE MEDICAL CARE IF:  °· Your pain does not go away within 2 hours. °· You keep throwing up (vomiting). °· Your pain is felt only in portions of the abdomen, such as the right side or the left lower portion of the abdomen. °· You pass bloody or black tarry stools. °MAKE SURE YOU: °· Understand these instructions.   °· Will watch your condition.   °· Will get help right away if you are not doing well or get worse.   °Document Released: 10/15/2004 Document Revised: 01/10/2013 Document Reviewed: 09/14/2012 °[employer]® Patient Information  ©2015 [employer]. This information is not intended to replace advice given to you by your health care provider. Make sure you discuss any questions you have with your health care provider. ° °Nausea and Vomiting °Nausea is a sick feeling that often comes before throwing up (vomiting). Vomiting is a reflex where stomach contents come out of your mouth. Vomiting can cause severe loss of body fluids (dehydration). Children and elderly adults can become dehydrated quickly, especially if they also have diarrhea. Nausea and vomiting are symptoms of a condition or disease. It is important to find the cause of your symptoms. °CAUSES  °· Direct irritation of the stomach lining. This irritation can result from increased acid production (gastroesophageal reflux disease), infection, food poisoning, taking certain medicines (such as nonsteroidal anti-inflammatory drugs), alcohol use, or tobacco use. °· Signals from the brain. These signals could be caused by a headache, heat exposure, an inner ear disturbance, increased pressure in the brain from injury, infection, a tumor, or a concussion, pain, emotional stimulus, or metabolic problems. °· An obstruction in the gastrointestinal tract (bowel obstruction). °· Illnesses such as diabetes, hepatitis, gallbladder problems, appendicitis, kidney problems, cancer, sepsis, atypical symptoms of a heart attack, or eating disorders. °· Medical treatments such as chemotherapy and radiation. °· Receiving medicine that makes you sleep (general anesthetic) during surgery. °DIAGNOSIS °Your caregiver may ask for tests to be done if the problems do not improve after a few days. Tests may also be done if symptoms are severe or if the reason for the nausea   and vomiting is not clear. Tests may include: °· Urine tests. °· Blood tests. °· Stool tests. °· Cultures (to look for evidence of infection). °· X-rays or other imaging studies. °Test results can help your caregiver make decisions about  treatment or the need for additional tests. °TREATMENT °You need to stay well hydrated. Drink frequently but in small amounts. You may wish to drink water, sports drinks, clear broth, or eat frozen ice pops or gelatin dessert to help stay hydrated. When you eat, eating slowly may help prevent nausea. There are also some antinausea medicines that may help prevent nausea. °HOME CARE INSTRUCTIONS  °· Take all medicine as directed by your caregiver. °· If you do not have an appetite, do not force yourself to eat. However, you must continue to drink fluids. °· If you have an appetite, eat a normal diet unless your caregiver tells you differently. °¨ Eat a variety of complex carbohydrates (rice, wheat, potatoes, bread), lean meats, yogurt, fruits, and vegetables. °¨ Avoid high-fat foods because they are more difficult to digest. °· Drink enough water and fluids to keep your urine clear or pale yellow. °· If you are dehydrated, ask your caregiver for specific rehydration instructions. Signs of dehydration may include: °¨ Severe thirst. °¨ Dry lips and mouth. °¨ Dizziness. °¨ Dark urine. °¨ Decreasing urine frequency and amount. °¨ Confusion. °¨ Rapid breathing or pulse. °SEEK IMMEDIATE MEDICAL CARE IF:  °· You have blood or brown flecks (like coffee grounds) in your vomit. °· You have black or bloody stools. °· You have a severe headache or stiff neck. °· You are confused. °· You have severe abdominal pain. °· You have chest pain or trouble breathing. °· You do not urinate at least once every 8 hours. °· You develop cold or clammy skin. °· You continue to vomit for longer than 24 to 48 hours. °· You have a fever. °MAKE SURE YOU:  °· Understand these instructions. °· Will watch your condition. °· Will get help right away if you are not doing well or get worse. °Document Released: 01/05/2005 Document Revised: 03/30/2011 Document Reviewed: 06/04/2010 °ExitCare® Patient Information ©2015 [employer]. This information is not  intended to replace advice given to you by your health care provider. Make sure you discuss any questions you have with your health care provider. ° °

## 2014-07-30 DIAGNOSIS — M4696 Unspecified inflammatory spondylopathy, lumbar region: Secondary | ICD-10-CM | POA: Diagnosis not present

## 2014-07-30 DIAGNOSIS — M47816 Spondylosis without myelopathy or radiculopathy, lumbar region: Secondary | ICD-10-CM | POA: Diagnosis not present

## 2014-07-30 LAB — URINE CULTURE

## 2014-07-31 ENCOUNTER — Ambulatory Visit (HOSPITAL_COMMUNITY)
Admission: RE | Admit: 2014-07-31 | Discharge: 2014-07-31 | Disposition: A | Discharge status: Home / Self Care | Payer: Medicare Other | Source: Ambulatory Visit | Attending: Family Medicine | Admitting: Family Medicine

## 2014-07-31 ENCOUNTER — Ambulatory Visit (INDEPENDENT_AMBULATORY_CARE_PROVIDER_SITE_OTHER): Payer: Medicare Other | Admitting: Family Medicine

## 2014-07-31 ENCOUNTER — Encounter: Payer: Self-pay | Admitting: Family Medicine

## 2014-07-31 ENCOUNTER — Telehealth: Payer: Self-pay | Admitting: *Deleted

## 2014-07-31 ENCOUNTER — Other Ambulatory Visit (HOSPITAL_COMMUNITY)
Admission: RE | Admit: 2014-07-31 | Discharge: 2014-07-31 | Disposition: A | Discharge status: Home / Self Care | Payer: Medicare Other | Source: Ambulatory Visit | Attending: Family Medicine | Admitting: Family Medicine

## 2014-07-31 VITALS — BP 118/74 | HR 68 | Resp 16 | Ht 69.0 in | Wt 139.1 lb

## 2014-07-31 DIAGNOSIS — R1084 Generalized abdominal pain: Secondary | ICD-10-CM

## 2014-07-31 DIAGNOSIS — R109 Unspecified abdominal pain: Secondary | ICD-10-CM | POA: Insufficient documentation

## 2014-07-31 DIAGNOSIS — Z8719 Personal history of other diseases of the digestive system: Secondary | ICD-10-CM | POA: Insufficient documentation

## 2014-07-31 DIAGNOSIS — R110 Nausea: Secondary | ICD-10-CM | POA: Diagnosis not present

## 2014-07-31 DIAGNOSIS — R079 Chest pain, unspecified: Secondary | ICD-10-CM | POA: Diagnosis not present

## 2014-07-31 DIAGNOSIS — R350 Frequency of micturition: Secondary | ICD-10-CM | POA: Insufficient documentation

## 2014-07-31 DIAGNOSIS — F17210 Nicotine dependence, cigarettes, uncomplicated: Secondary | ICD-10-CM

## 2014-07-31 DIAGNOSIS — F32A Depression, unspecified: Secondary | ICD-10-CM

## 2014-07-31 DIAGNOSIS — F17218 Nicotine dependence, cigarettes, with other nicotine-induced disorders: Secondary | ICD-10-CM | POA: Diagnosis not present

## 2014-07-31 DIAGNOSIS — F329 Major depressive disorder, single episode, unspecified: Secondary | ICD-10-CM

## 2014-07-31 LAB — POCT URINALYSIS DIPSTICK
Bilirubin, UA: NEGATIVE
Glucose, UA: NEGATIVE
Ketones, UA: NEGATIVE
Leukocytes, UA: NEGATIVE
Nitrite, UA: NEGATIVE
PH UA: 6
Protein, UA: NEGATIVE
RBC UA: NEGATIVE
Spec Grav, UA: 1.02
Urobilinogen, UA: 0.2

## 2014-07-31 LAB — CBC WITH DIFFERENTIAL/PLATELET
Basophils Absolute: 0 10*3/uL (ref 0.0–0.1)
Basophils Relative: 0 % (ref 0–1)
Eosinophils Absolute: 0 10*3/uL (ref 0.0–0.7)
Eosinophils Relative: 0 % (ref 0–5)
HEMATOCRIT: 38 % (ref 36.0–46.0)
HEMOGLOBIN: 12.6 g/dL (ref 12.0–15.0)
Lymphocytes Relative: 24 % (ref 12–46)
Lymphs Abs: 2.3 10*3/uL (ref 0.7–4.0)
MCH: 30.4 pg (ref 26.0–34.0)
MCHC: 33.2 g/dL (ref 30.0–36.0)
MCV: 91.6 fL (ref 78.0–100.0)
MONO ABS: 0.9 10*3/uL (ref 0.1–1.0)
Monocytes Relative: 9 % (ref 3–12)
NEUTROS ABS: 6.6 10*3/uL (ref 1.7–7.7)
Neutrophils Relative %: 67 % (ref 43–77)
PLATELETS: 205 10*3/uL (ref 150–400)
RBC: 4.15 MIL/uL (ref 3.87–5.11)
RDW: 14.1 % (ref 11.5–15.5)
WBC: 9.9 10*3/uL (ref 4.0–10.5)

## 2014-07-31 LAB — COMPREHENSIVE METABOLIC PANEL
ALBUMIN: 4.8 g/dL (ref 3.5–5.0)
ALT: 18 U/L (ref 14–54)
AST: 23 U/L (ref 15–41)
Alkaline Phosphatase: 76 U/L (ref 38–126)
Anion gap: 11 (ref 5–15)
BILIRUBIN TOTAL: 0.8 mg/dL (ref 0.3–1.2)
BUN: 11 mg/dL (ref 6–20)
CHLORIDE: 101 mmol/L (ref 101–111)
CO2: 28 mmol/L (ref 22–32)
CREATININE: 0.93 mg/dL (ref 0.44–1.00)
Calcium: 9.5 mg/dL (ref 8.9–10.3)
GFR calc Af Amer: >60 mL/min (ref >60)
GLUCOSE: 90 mg/dL (ref 65–99)
Potassium: 3.7 mmol/L (ref 3.5–5.1)
Sodium: 140 mmol/L (ref 135–145)
Total Protein: 8.4 g/dL — ABNORMAL HIGH (ref 6.5–8.1)

## 2014-07-31 LAB — LIPASE, BLOOD: Lipase: 16 U/L — ABNORMAL LOW (ref 22–51)

## 2014-07-31 LAB — AMYLASE: AMYLASE: 32 U/L (ref 28–100)

## 2014-07-31 MED ORDER — ONDANSETRON HCL 4 MG/2ML IJ SOLN
4.0000 mg | Freq: Once | INTRAMUSCULAR | Status: AC
  Administered 2014-07-31: 4 mg via INTRAMUSCULAR

## 2014-07-31 NOTE — Telephone Encounter (Signed)
Pt is scheduled for [facility] with Neil Crouch 08/27/14 at 8:30 and I called pt and made her aware of this appt.

## 2014-07-31 NOTE — Assessment & Plan Note (Signed)

## 2014-07-31 NOTE — Patient Instructions (Addendum)
F/u as before  Pls get xray and blood work this morning at the hospital, we will contact you about them , so have cell phonwe on  Zofran in office   Urine will be checked in office  We will get appt with local GI Doc to help you through this  Pls stop smoking  Thanks for choosing [facility], we consider it a privelige to serve you.

## 2014-07-31 NOTE — Assessment & Plan Note (Signed)
Last emesis was 2 days ago, zofran IM in office

## 2014-08-08 ENCOUNTER — Other Ambulatory Visit (HOSPITAL_COMMUNITY)
Admission: RE | Admit: 2014-08-08 | Discharge: 2014-08-08 | Disposition: A | Discharge status: Home / Self Care | Payer: Medicare Other | Source: Ambulatory Visit | Attending: Advanced Practice Midwife | Admitting: Advanced Practice Midwife

## 2014-08-08 ENCOUNTER — Ambulatory Visit (INDEPENDENT_AMBULATORY_CARE_PROVIDER_SITE_OTHER): Payer: Medicare Other | Admitting: Advanced Practice Midwife

## 2014-08-08 ENCOUNTER — Encounter: Payer: Self-pay | Admitting: Advanced Practice Midwife

## 2014-08-08 VITALS — BP 120/80 | HR 64 | Ht 66.0 in | Wt 139.0 lb

## 2014-08-08 DIAGNOSIS — N760 Acute vaginitis: Secondary | ICD-10-CM | POA: Diagnosis present

## 2014-08-08 DIAGNOSIS — Z113 Encounter for screening for infections with a predominantly sexual mode of transmission: Secondary | ICD-10-CM | POA: Diagnosis present

## 2014-08-08 DIAGNOSIS — Z01419 Encounter for gynecological examination (general) (routine) without abnormal findings: Secondary | ICD-10-CM

## 2014-08-08 DIAGNOSIS — Z124 Encounter for screening for malignant neoplasm of cervix: Secondary | ICD-10-CM

## 2014-08-08 DIAGNOSIS — Z1151 Encounter for screening for human papillomavirus (HPV): Secondary | ICD-10-CM | POA: Diagnosis present

## 2014-08-08 DIAGNOSIS — R87810 Cervical high risk human papillomavirus (HPV) DNA test positive: Secondary | ICD-10-CM | POA: Insufficient documentation

## 2014-08-08 NOTE — Progress Notes (Signed)
Tracey Daniel 34 y.o.  Filed Vitals:   08/08/14 0855  BP: 120/80  Pulse: 64     Past Medical History: Past Medical History  Diagnosis Date  . Migraines   . Osteoporosis   . Seasonal allergies   . Sinusitis   . Back pain, chronic   . Nicotine addiction   . Constipation   . Vitamin D deficiency   . Gastritis   . Gastroparesis   . Pyloric spasm 03/30/2011  . Gastric outlet obstruction   . COPD (chronic obstructive pulmonary disease)   . Asthma   . Substance abuse 2008    marijuana  . Peptic ulcer disease   . Depression 2000    h/o suicidal ideation  . Anemia of other chronic disease 11/29/2012  . Chronic abdominal pain   . Nausea and vomiting     recurrent    Past Surgical History: Past Surgical History  Procedure Laterality Date  . Cholecystectomy      ?2002  . Laser surgery on cervix    . Carpal tunnel release      left hand  . Esophagogastroduodenoscopy  12/25/2010    Dorothyann Peng, MD;  moderate gastritis, ?goo secondary to pylorspasm. BX showed reactive gstropathy no h.pyori, SB mucosa with intramucosal lymphocytosis and partial villous blunting (TTG 4.0 normal)  . Esophageal dilation  12/25/2010    Procedure: ESOPHAGEAL DILATION;  Surgeon: Dorothyann Peng, MD;  [facility]: [facility];  Service: Endoscopy;;  . Esophagogastroduodenoscopy N/A 11/14/2012    OVF:[facility]PP[facility] hiatal hernia. Abnormal gastric mucosa of  uncertain significance-status post biopsy. Subjectively, patient may have recurrent symptomatic, pylorospasm.  . Esophagogastroduodenoscopy (egd) with propofol N/A 02/09/2013    elongated stomach, partial lower esophageal ring widely patent. No obvious pyloric stenosis s/p Botox  . Botox injection N/A 02/09/2013    Procedure: BOTOX INJECTION;  Surgeon: Missy Sabins, MD;  Location: [facility];  Service: Endoscopy;  Laterality: N/A;  possible balloon  . Balloon dilation N/A 02/09/2013    Procedure: BALLOON DILATION;  Surgeon: Missy Sabins, MD;  Location:  [facility];  Service: Endoscopy;  Laterality: N/A;    Family History: Family History  Problem Relation Age of Onset  . Diabetes Father   . Liver disease Father     liver transplant at [facility], age 59  . Colon cancer Neg Hx   . Lung cancer Mother 45  . Heart disease Maternal Grandmother   . Parkinson's disease Maternal Grandfather   . Multiple sclerosis Sister 69  . Depression Sister 16    bipolar and schizophrenic  . Alcohol abuse Sister     Social History: History  Substance Use Topics  . Smoking status: Current Every Day Smoker -- 1.00 packs/day for 15 years    Types: Cigarettes  . Smokeless tobacco: Never Used     Comment: quit 2011  . Alcohol Use: No     Comment: none since July. Periodic heavy drinker for months at a time.    Allergies:  Allergies  Allergen Reactions  . Benadryl [Diphenhydramine] Anaphylaxis  . Latex Itching and Other (See Comments)    "burning" where the tape goes  . Penicillins     unknown      Current outpatient prescriptions:  .  albuterol (PROVENTIL HFA;VENTOLIN HFA) 108 (90 BASE) MCG/ACT inhaler, Inhale 2 puffs into the lungs every 6 (six) hours as needed. Shortness of breath (Patient taking differently: Inhale 2 puffs into the lungs every 6 (six) hours as needed  for wheezing or shortness of breath. Shortness of breath), Disp: 6.7 g, Rfl: 4 .  budesonide-formoterol (SYMBICORT) 80-4.5 MCG/ACT inhaler, Inhale 2 puffs into the lungs 2 (two) times daily. (Patient taking differently: Inhale 2 puffs into the lungs every evening. ), Disp: 1 Inhaler, Rfl: 4 .  Cholecalciferol (VITAMIN D3) 2000 UNITS CHEW, Chew 1 capsule by mouth daily. , Disp: , Rfl:  .  clobetasol cream (TEMOVATE) 5.05 %, Apply 1 application topically 2 (two) times daily as needed (rash). Apply to rash, Disp: , Rfl:  .  DULoxetine (CYMBALTA) 30 MG capsule, Take 1 capsule (30 mg total) by mouth daily., Disp: 30 capsule, Rfl: 3 .  ergocalciferol (VITAMIN D2) 50000 UNITS capsule, Take 1  capsule (50,000 Units total) by mouth once a week. One capsule once weekly, Disp: 12 capsule, Rfl: 1 .  feeding supplement (ENSURE CLINICAL STRENGTH) LIQD, Take 237 mLs by mouth 3 (three) times daily with meals., Disp: 10 Bottle, Rfl: 3 .  ferrous sulfate 325 (65 FE) MG tablet, Take 325 mg by mouth daily with breakfast., Disp: , Rfl:  .  fluticasone (FLONASE) 50 MCG/ACT nasal spray, Place 2 sprays into both nostrils daily., Disp: , Rfl:  .  gabapentin (NEURONTIN) 100 MG capsule, Take 100 mg by mouth 3 (three) times daily., Disp: , Rfl:  .  HYDROcodone-acetaminophen (NORCO) 7.5-325 MG per tablet, Take 1 tablet by mouth every 6 (six) hours as needed. , Disp: , Rfl:  .  loratadine (CLARITIN) 10 MG tablet, Take 1 tablet (10 mg total) by mouth daily., Disp: 30 tablet, Rfl: 5 .  mometasone (NASONEX) 50 MCG/ACT nasal spray, Place 2 sprays into the nose 2 (two) times daily., Disp: 17 g, Rfl: 12 .  Multiple Vitamin (MULTIVITAMIN WITH MINERALS) TABS tablet, Take 1 tablet by mouth daily., Disp: , Rfl:  .  ondansetron (ZOFRAN) 4 MG tablet, Take 1 tablet (4 mg total) by mouth every 6 (six) hours as needed for nausea or vomiting., Disp: 30 tablet, Rfl: 1 .  pantoprazole (PROTONIX) 40 MG tablet, Take 1 tablet (40 mg total) by mouth 2 (two) times daily before a meal., Disp: 180 tablet, Rfl: 3 .  promethazine (PROMETHEGAN) 25 MG suppository, Place 1 suppository (25 mg total) rectally every 6 (six) hours as needed for nausea or vomiting., Disp: 6 each, Rfl: 0 .  simvastatin (ZOCOR) 20 MG tablet, Take 20 mg by mouth daily., Disp: , Rfl:  .  sucralfate (CARAFATE) 1 GM/10ML suspension, Take 10 mLs (1 g total) by mouth 4 (four) times daily -  with meals and at bedtime., Disp: 420 mL, Rfl: 1 .  tiZANidine (ZANAFLEX) 4 MG tablet, Take 4 mg by mouth every 6 (six) hours as needed for muscle spasms. , Disp: , Rfl:  .  calcium-vitamin D (OSCAL 500/200 D-3) 500-200 MG-UNIT per tablet, Take 1 tablet by mouth 2 (two) times daily.,  Disp: 180 tablet, Rfl: 3 .  dicyclomine (BENTYL) 20 MG tablet, Take 1 tablet (20 mg total) by mouth 2 (two) times daily. (Patient not taking: Reported on 08/08/2014), Disp: 20 tablet, Rfl: 0 .  estradiol (ESTRACE VAGINAL) 0.1 MG/GM vaginal cream, Place 2 g vaginally at bedtime. As directed, Disp: , Rfl:  .  ondansetron (ZOFRAN ODT) 4 MG disintegrating tablet, Take 1 tablet (4 mg total) by mouth every 8 (eight) hours as needed for nausea or vomiting. (Patient not taking: Reported on 08/08/2014), Disp: 10 tablet, Rfl: 0 .  oxyCODONE (OXY IR/ROXICODONE) 5 MG immediate release tablet, Take 1  tablet (5 mg total) by mouth every 4 (four) hours as needed for moderate pain. (Patient not taking: Reported on 08/08/2014), Disp: 10 tablet, Rfl: 0  History of Present Illness: here for Pap.  Last pap 2013, normal.  Dr Moshe Cipro is PCP and watches labs.    Review of Systems   Patient denies any headaches, blurred vision, shortness of breath, chest pain,problems with bowel movements, urination, or intercourse. Had trich earlier this year (dx after started having VB).  Sx disappeared after treatment.  Has been seeing GI MD for nausea/abdominal pain.     Physical Exam: General:  Well developed, well nourished, no acute distress Skin:  Warm and dry Neck:  Midline trachea, normal thyroid Lungs; Clear to auscultation bilaterally Breast:  No dominant palpable mass, retraction, or nipple discharge Cardiovascular: Regular rate and rhythm Abdomen:  Soft, non tender, no hepatosplenomegaly Pelvic:  External genitalia is normal in appearance.  The vagina is normal in appearance.  Normal appearing discharge. The cervix is bulbous.  Uterus is felt to be normal size, shape, and contour.  No adnexal masses or tenderness noted.  Extremities:  No swelling or varicosities noted Psych:  No mood changes.     Impression: Normal GYN exam Premature ovarian failure (dx >10 years ago)     Plan: Continue MVI/Vit D as rx'd by  PCP If pap normal, repeat 3 years GC/CHL/trich added to pap

## 2014-08-09 LAB — CYTOLOGY - PAP

## 2014-08-12 ENCOUNTER — Encounter: Payer: Self-pay | Admitting: Advanced Practice Midwife

## 2014-08-13 DIAGNOSIS — M7138 Other bursal cyst, other site: Secondary | ICD-10-CM | POA: Diagnosis not present

## 2014-08-13 DIAGNOSIS — M4696 Unspecified inflammatory spondylopathy, lumbar region: Secondary | ICD-10-CM | POA: Diagnosis not present

## 2014-08-27 ENCOUNTER — Ambulatory Visit (INDEPENDENT_AMBULATORY_CARE_PROVIDER_SITE_OTHER): Payer: Medicare Other | Admitting: Gastroenterology

## 2014-08-27 ENCOUNTER — Encounter: Payer: Self-pay | Admitting: Gastroenterology

## 2014-08-27 ENCOUNTER — Other Ambulatory Visit: Payer: Self-pay

## 2014-08-27 VITALS — BP 97/61 | HR 66 | Temp 97.3°F | Ht 66.0 in | Wt 138.0 lb

## 2014-08-27 DIAGNOSIS — R194 Change in bowel habit: Secondary | ICD-10-CM

## 2014-08-27 DIAGNOSIS — K219 Gastro-esophageal reflux disease without esophagitis: Secondary | ICD-10-CM

## 2014-08-27 DIAGNOSIS — K5900 Constipation, unspecified: Secondary | ICD-10-CM | POA: Diagnosis not present

## 2014-08-27 DIAGNOSIS — R1013 Epigastric pain: Secondary | ICD-10-CM

## 2014-08-27 MED ORDER — SUCRALFATE 1 GM/10ML PO SUSP: ORAL | Status: DC

## 2014-08-27 MED ORDER — PEG 3350-KCL-NA BICARB-NACL 420 G PO SOLR: 4000.0000 mL | Freq: Once | ORAL | Status: DC

## 2014-08-27 MED ORDER — LINACLOTIDE 290 MCG PO CAPS: 290.0000 ug | ORAL_CAPSULE | Freq: Every day | ORAL | Status: DC

## 2014-08-27 MED ORDER — SUCRALFATE 1 G PO TABS: ORAL_TABLET | ORAL | Status: DC

## 2014-08-27 NOTE — Patient Instructions (Signed)
1. Colonoscopy as scheduled. See separate instructions.  2. Take Carafate every morning and at bedtime as needed for burning in stomach. New RX sent to your pharmacy. 3. Continue pantoprazole twice daily before a meal. 4. Please discuss your near syncope with Dr. Moshe Cipro within next 24 hours.  5. Please follow up with pain clinic about chronic upper abdominal pain.

## 2014-08-27 NOTE — Progress Notes (Signed)
Primary Care Physician: [name], MD  Primary Gastroenterologist:  Garfield Cornea, MD   Chief Complaint  Patient presents with  . Abdominal Pain    HPI: Tracey Daniel is a 34 y.o. female here for follow-up. She was last seen in October 2015. She has a history of gastroesophageal reflux disease, pylorospasm resulting gastric outlet obstruction requiring Botox, constipation. Last Botox injection was by Dr. Amedeo Plenty during hospitalization in [place] back in January 2015. Subsequently followed up with Dr.Koch at [facility] last year. Additional gastric emptying study normal at 2 and 4 hours. He referred her to pain clinic for abdominal wall syndrome/positive Carnett's sign. Patient had 100% relief of her abdominal pain status post celiac plexus block via splanchnic approach on 03/27/2014. Was on gabapentin 300mg  tid then. Unclear how much she is on now if any, 100mg  TID listed today on drug list.   Patient states she was doing fairly well until 05/2014. She has required injections for chronic back pain, cyst on spine. Back in May caught a "stomach virus". Developed vomiting for a few days, no diarrhea. Chronically has had problems with her bowel movements. We have seen in the past for chronic constipation the last year was having more issues with diarrhea. Since may her constipation has dramatically worsened having difficulty having a bowel movement even once per week. Has tried Linzess 169mcg daily and Miralax without relief. Two abdominal films recently without obstruction. Abdominal discomfort in the lower abdomen, unrelieved with bowel movements. Denies blood in the stool. Some nausea but no vomiting. Continues to have epigastric burning even on pantoprazole 40 mg twice a day. Seems to be worse at night. Carafate used to help but she needs a new prescription. Denies dysphagia. Continues to have some epigastric abdominal wall pain. Overdue for follow-up at the pain clinic. Of note, she  has received chronic hydrocodone from Dr. Luna Glasgow and and more recently from Dr. Maryjean Ka (neurosurgery). Per patient, they are weaning her off, recently decreased dosage.      Current Outpatient Prescriptions  Medication Sig Dispense Refill  . albuterol (PROVENTIL HFA;VENTOLIN HFA) 108 (90 BASE) MCG/ACT inhaler Inhale 2 puffs into the lungs every 6 (six) hours as needed. Shortness of breath (Patient taking differently: Inhale 2 puffs into the lungs every 6 (six) hours as needed for wheezing or shortness of breath. Shortness of breath) 6.7 g 4  . budesonide-formoterol (SYMBICORT) 80-4.5 MCG/ACT inhaler Inhale 2 puffs into the lungs 2 (two) times daily. (Patient taking differently: Inhale 2 puffs into the lungs every evening. ) 1 Inhaler 4  . Cholecalciferol (VITAMIN D3) 2000 UNITS CHEW Chew 1 capsule by mouth daily.     . clobetasol cream (TEMOVATE) 7.32 % Apply 1 application topically 2 (two) times daily as needed (rash). Apply to rash    . DULoxetine (CYMBALTA) 30 MG capsule Take 1 capsule (30 mg total) by mouth daily. 30 capsule 3  . ergocalciferol (VITAMIN D2) 50000 UNITS capsule Take 1 capsule (50,000 Units total) by mouth once a week. One capsule once weekly 12 capsule 1  . estradiol (ESTRACE VAGINAL) 0.1 MG/GM vaginal cream Place 2 g vaginally at bedtime. As directed    . feeding supplement (ENSURE CLINICAL STRENGTH) LIQD Take 237 mLs by mouth 3 (three) times daily with meals. 10 Bottle 3  . ferrous sulfate 325 (65 FE) MG tablet Take 325 mg by mouth daily with breakfast.    . fluticasone (FLONASE) 50 MCG/ACT nasal spray Place 2 sprays into both nostrils  daily.    . gabapentin (NEURONTIN) 100 MG capsule Take 100 mg by mouth 3 (three) times daily.    Marland Kitchen HYDROcodone-acetaminophen (NORCO/VICODIN) 5-325 MG per tablet Take 1 tablet by mouth 2 (two) times daily.    Marland Kitchen loratadine (CLARITIN) 10 MG tablet Take 1 tablet (10 mg total) by mouth daily. 30 tablet 5  . mometasone (NASONEX) 50 MCG/ACT nasal  spray Place 2 sprays into the nose 2 (two) times daily. 17 g 12  . ondansetron (ZOFRAN) 4 MG tablet Take 1 tablet (4 mg total) by mouth every 6 (six) hours as needed for nausea or vomiting. 30 tablet 1  . pantoprazole (PROTONIX) 40 MG tablet Take 1 tablet (40 mg total) by mouth 2 (two) times daily before a meal. 180 tablet 3  . promethazine (PROMETHEGAN) 25 MG suppository Place 1 suppository (25 mg total) rectally every 6 (six) hours as needed for nausea or vomiting. 6 each 0  . simvastatin (ZOCOR) 20 MG tablet Take 20 mg by mouth daily.    . sucralfate (CARAFATE) 1 GM/10ML suspension Take 10 mLs (1 g total) by mouth 4 (four) times daily -  with meals and at bedtime. 420 mL 1  . tiZANidine (ZANAFLEX) 4 MG tablet Take 4 mg by mouth every 6 (six) hours as needed for muscle spasms.     . calcium-vitamin D (OSCAL 500/200 D-3) 500-200 MG-UNIT per tablet Take 1 tablet by mouth 2 (two) times daily. 180 tablet 3   No current facility-administered medications for this visit.    Allergies as of 08/27/2014 - Review Complete 08/27/2014  Allergen Reaction Noted  . Benadryl [diphenhydramine] Anaphylaxis 03/25/2014  . Latex Itching and Other (See Comments) 11/30/2011  . Penicillins     Past Medical History  Diagnosis Date  . Migraines   . Osteoporosis   . Seasonal allergies   . Sinusitis   . Back pain, chronic   . Nicotine addiction   . Constipation   . Vitamin D deficiency   . Gastritis   . Gastroparesis   . Pyloric spasm 03/30/2011  . Gastric outlet obstruction   . COPD (chronic obstructive pulmonary disease)   . Asthma   . Substance abuse 2008    marijuana  . Peptic ulcer disease   . Depression 2000    h/o suicidal ideation  . Anemia of other chronic disease 11/29/2012  . Chronic abdominal pain   . Nausea and vomiting     recurrent   Past Surgical History  Procedure Laterality Date  . Cholecystectomy      ?2002  . Laser surgery on cervix    . Carpal tunnel release      left hand    . Esophagogastroduodenoscopy  12/25/2010    Dorothyann Peng, MD;  moderate gastritis, ?goo secondary to pylorspasm. BX showed reactive gstropathy no h.pyori, SB mucosa with intramucosal lymphocytosis and partial villous blunting (TTG 4.0 normal)  . Esophageal dilation  12/25/2010    Procedure: ESOPHAGEAL DILATION;  Surgeon: Dorothyann Peng, MD;  [facility]: [facility];  Service: Endoscopy;;  . Esophagogastroduodenoscopy N/A 11/14/2012    NOM:[facility] hiatal hernia. Abnormal gastric mucosa of  uncertain significance-status post biopsy. Subjectively, patient may have recurrent symptomatic, pylorospasm.  . Esophagogastroduodenoscopy (egd) with propofol N/A 02/09/2013    elongated stomach, partial lower esophageal ring widely patent. No obvious pyloric stenosis s/p Botox  . Botox injection N/A 02/09/2013    Procedure: BOTOX INJECTION;  Surgeon: Missy Sabins, MD;  Location: [facility];  Service: Endoscopy;  Laterality: N/A;  possible balloon  . Balloon dilation N/A 02/09/2013    Procedure: BALLOON DILATION;  Surgeon: Missy Sabins, MD;  Location: [facility];  Service: Endoscopy;  Laterality: N/A;   Family History  Problem Relation Age of Onset  . Diabetes Father   . Liver disease Father     liver transplant at [facility], age 24  . Colon cancer Neg Hx   . Lung cancer Mother 6  . Heart disease Maternal Grandmother   . Parkinson's disease Maternal Grandfather   . Multiple sclerosis Sister 42  . Depression Sister 16    bipolar and schizophrenic  . Alcohol abuse Sister    History   Social History  . Marital Status: Single    Spouse Name: N/A  . Number of Children: 0  . Years of Education: N/A   Occupational History  . disability    Social History Main Topics  . Smoking status: Current Every Day Smoker -- 15 years    Types: Cigarettes  . Smokeless tobacco: Never Used     Comment: smokes one cigarette daily  . Alcohol Use: No     Comment: none since July. Periodic heavy drinker for months at a  time.  . Drug Use: No  . Sexual Activity: Yes    Birth Control/ Protection: None   Other Topics Concern  . None   Social History Narrative    ROS:  General: Negative for anorexia, weight loss, fever, chills, + fatigue,  weakness. ENT: Negative for hoarseness, difficulty swallowing , nasal congestion. CV: Negative for chest pain, angina, palpitations, dyspnea on exertion, peripheral edema.  Respiratory: Negative for dyspnea at rest, dyspnea on exertion, cough, sputum, wheezing.  GI: See history of present illness. GU:  Negative for dysuria, hematuria, urinary incontinence, urinary frequency, nocturnal urination.  Endo: Negative for unusual weight change.  NEURO: states sometimes she feels like she is going to pass out.   Physical Examination:   BP 97/61 mmHg  Pulse 66  Temp(Src) 97.3 F (36.3 C) (Oral)  Ht 5\' 6"  (1.676 m)  Wt 138 lb (62.596 kg)  BMI 22.28 kg/m2  LMP 01/19/1997  General: Well-nourished, well-developed in no acute distress.  Eyes: No icterus. Mouth: Oropharyngeal mucosa moist and pink , no lesions erythema or exudate. Lungs: Clear to auscultation bilaterally.  Heart: Regular rate and rhythm, no murmurs rubs or gallops.  Abdomen: Bowel sounds are normal, minimal epigastric tenderness (+Carnett's sign), nondistended, no hepatosplenomegaly or masses, no abdominal bruits or hernia , no rebound or guarding.   Extremities: No lower extremity edema. No clubbing or deformities. Neuro: Alert and oriented x 4   Skin: Warm and dry, no jaundice.   Psych: Alert and cooperative, normal mood and affect.  Labs:  Lab Results  Component Value Date   WBC 9.9 07/31/2014   HGB 12.6 07/31/2014   HCT 38.0 07/31/2014   MCV 91.6 07/31/2014   PLT 205 07/31/2014   Lab Results  Component Value Date   CREATININE 0.93 07/31/2014   BUN 11 07/31/2014   NA 140 07/31/2014   K 3.7 07/31/2014   CL 101 07/31/2014   CO2 28 07/31/2014   Lab Results  Component Value Date   ALT  18 07/31/2014   AST 23 07/31/2014   ALKPHOS 76 07/31/2014   BILITOT 0.8 07/31/2014   Lab Results  Component Value Date   LIPASE 16* 07/31/2014    Imaging Studies: Dg Abd Acute W/chest  07/31/2014   CLINICAL  DATA:  Generalized chest pain on the left side along with nausea for 4 days. History of gastric outlet obstruction.  EXAM: DG ABDOMEN ACUTE W/ 1V CHEST  COMPARISON:  07/28/2014  FINDINGS: The cardiac silhouette, mediastinal and hilar contours are within normal limits and stable. The lungs are clear. No pleural effusion.  Two views of the abdomen demonstrate moderate air and stool throughout the colon and down into the rectum which may suggest mild constipation. No dilated small bowel loops to suggest obstruction. No free air. The soft tissue shadows are maintained. No worrisome calcifications. The bony structures are unremarkable.  IMPRESSION: No acute cardiopulmonary findings.  No plain film findings for an acute abdominal process.   Electronically Signed   By: Marijo Sanes M.D.   On: 07/31/2014 09:15   Dg Abd Acute W/chest  07/28/2014   CLINICAL DATA:  Generalized abdominal pain, nausea, vomiting, diarrhea.  EXAM: DG ABDOMEN ACUTE W/ 1V CHEST  COMPARISON:  July 13, 2014.  FINDINGS: There is no evidence of dilated bowel loops or free intraperitoneal air. Status post cholecystectomy. Small phlebolith seen in the right side of the pelvis. Heart size and mediastinal contours are within normal limits. Both lungs are clear.  IMPRESSION: No evidence of bowel obstruction or ileus. No acute cardiopulmonary disease.   Electronically Signed   By: Marijo Conception, M.D.   On: 07/28/2014 17:37   Impression/Plan: 34 year old female with history of pylorospasm with gastric outlet obstruction requiring intermittent Botox injection, last one in January 2015. Seems to be stable from that standpoint. She does have chronic epigastric pain, felt to be related to chronic abdominal wall syndrome. 100% relief of pain  status post celiac plexus block earlier this year, although has had some recurrent pain in the past month. Denies typical heartburn but has some burning in the epigastrium, previously responded to Carafate. I have encouraged her to follow-up with pain clinic at [facility] for chronic abdominal wall pain.  Presents today for change in bowel habits. She has had a history of constipation the past, when I saw her one year ago she was having diarrhea even after stopping her Linzess. For the past 3 months she's been having difficulty managing her constipation. May be secondary to narcotics but given bowel habit change in no previous colonoscopy, recommend colonoscopy in the near future. Plan for deep sedation in the OR due to chronic narcotics. I have discussed the risks, alternatives, benefits with regards to but not limited to the risk of reaction to medication, bleeding, infection, perforation and the patient is agreeable to proceed. Written consent to be obtained.

## 2014-08-27 NOTE — Progress Notes (Signed)
CC'ED TO PCP 

## 2014-09-01 ENCOUNTER — Encounter: Payer: Self-pay | Admitting: Family Medicine

## 2014-09-01 NOTE — Progress Notes (Signed)
   Sidni Fusco Reist     MRN: [id number]      DOB: May 07, 1980   HPI Ms. Hubbert is here for follow up  From recent ed viisit for recurrent abdominal pain, which is generalized associated with significant nausea, she has not vomited, rates pain  Between 8 to 10 and has a lot of abdominal distension. She  Denies any fever or chills , she does c/o urinary frequency   Preventive health is updated, specifically  Cancer screening and Immunization.      ROS Denies recent fever or chills. Denies sinus pressure, nasal congestion, ear pain or sore throat. Denies chest congestion, productive cough or wheezing. Denies chest pains, palpitations and leg swelling .    Denies joint pain, swelling and limitation in mobility. Denies headaches, seizures, numbness, or tingling. Denies  Uncontrolled depression, anxiety or insomnia. Denies skin break down or rash.   PE  BP 118/74 mmHg  Pulse 68  Resp 16  Ht 5\' 9"  (1.753 m)  Wt 139 lb 1.9 oz (63.104 kg)  BMI 20.53 kg/m2  SpO2 100%  LMP 01/19/1997  Patient alert and oriented and in no cardiopulmonary distress.  HEENT: No facial asymmetry, EOMI,   oropharynx pink and moist.  Neck supple no JVD, no mass.  Chest: Clear to auscultation bilaterally.  CVS: S1, S2 no murmurs, no S3.Regular rate.  ABD: Soft diffuse superficial tenderness and distension, hyperactive BS, no organomegaly or palpable mass No renal; angle tenderness, increased epigastric and supra pubic tenderness  Ext: No edema  MS: Adequate ROM spine, shoulders, hips and knees.  Skin: Intact, no ulcerations or rash noted.  Psych: Good eye contact, normal affect. Memory intact not anxious or depressed appearing.  CNS: CN 2-12 intact, power,  normal throughout.no focal deficits noted.   Assessment & Plan Nausea without vomiting Last emesis was 2 days ago, zofran IM in office  Nicotine dependence Patient counseled for approximately 5 minutes regarding the health risks of  ongoing nicotine use, specifically all types of cancer, heart disease, stroke and respiratory failure. The options available for help with cessation ,the behavioral changes to assist the process, and the option to either gradully reduce usage  Or abruptly stop.is also discussed. Pt is also encouraged to set specific goals in number of cigarettes used daily, as well as to set a quit date.  Number of cigarettes/cigars currently smoking daily: 2   Abdominal pain Recurrent generalized abdominal pain, has had gastric outlet obstruction and received botox treatment.will ontain abdominal xray todya Recurrent significant pain, refer top local GI for assistance with symptoms  Depression Reports stable, sees mental health on an intermittent basis, not suicidal or homicidal  Urinary frequency Urinalysis in office negative for infection

## 2014-09-01 NOTE — Assessment & Plan Note (Signed)
Reports stable, sees mental health on an intermittent basis, not suicidal or homicidal

## 2014-09-01 NOTE — Assessment & Plan Note (Signed)
Urinalysis in office negative for infection  

## 2014-09-01 NOTE — Assessment & Plan Note (Addendum)
Recurrent generalized abdominal pain, has had gastric outlet obstruction and received botox treatment.will ontain abdominal xray todya Recurrent significant pain, refer top local GI for assistance with symptoms

## 2014-09-14 NOTE — Patient Instructions (Addendum)
84    Your procedure is scheduled on: 09/20/14  Report to [facility] at   6:30  AM.  Call this number if you have problems the morning of surgery: [phone]   Remember:   Do not drink or eat food:After Midnight.    Clear liquids include soda, tea, black coffee, apple or grape juice, broth.  Take these medicines the morning of surgery with A SIP OF WATER: Cymbalta, Protonix, Claritin, neurontin, zofran and use albuterol inhaler.   Do not wear jewelry, make-up or nail polish.  Do not wear lotions, powders, or perfumes. You may wear deodorant.  Do not shave 48 hours prior to surgery. Men may shave face and neck.  Do not bring valuables to the hospital.  Contacts, dentures or bridgework may not be worn into surgery.  Leave suitcase in the car. After surgery it may be brought to your room.  For patients admitted to the hospital, checkout time is 11:00 AM the day of discharge.   Patients discharged the day of surgery will not be allowed to drive home.  Name and phone number of your driver:    Please read over the following fact sheets that you were given: Pain Booklet, Lab Information and Anesthesia Post-op Instructions   Colonoscopy, Care After Refer to this sheet in the next few weeks. These instructions provide you with information on caring for yourself after your procedure. Your health care provider may also give you more specific instructions. Your treatment has been planned according to current medical practices, but problems sometimes occur. Call your health care provider if you have any problems or questions after your procedure. WHAT TO EXPECT AFTER THE PROCEDURE  After your procedure, it is typical to have the following:  A small amount of blood in your stool.  Moderate amounts of gas and mild abdominal cramping or bloating. HOME CARE INSTRUCTIONS  Do not drive, operate machinery, or sign important documents for 24 hours.  You may shower and resume your regular physical  activities, but move at a slower pace for the first 24 hours.  Take frequent rest periods for the first 24 hours.  Walk around or put a warm pack on your abdomen to help reduce abdominal cramping and bloating.  Drink enough fluids to keep your urine clear or pale yellow.  You may resume your normal diet as instructed by your health care provider. Avoid heavy or fried foods that are hard to digest.  Avoid drinking alcohol for 24 hours or as instructed by your health care provider.  Only take over-the-counter or prescription medicines as directed by your health care provider.  If a tissue sample (biopsy) was taken during your procedure:  Do not take aspirin or blood thinners for 7 days, or as instructed by your health care provider.  Do not drink alcohol for 7 days, or as instructed by your health care provider.  Eat soft foods for the first 24 hours. SEEK MEDICAL CARE IF: You have persistent spotting of blood in your stool 2-3 days after the procedure. SEEK IMMEDIATE MEDICAL CARE IF:  You have more than a small spotting of blood in your stool.  You pass large blood clots in your stool.  Your abdomen is swollen (distended).  You have nausea or vomiting.  You have a fever.  You have increasing abdominal pain that is not relieved with medicine. Document Released: 08/20/2003 Document Revised: 10/26/2012 Document Reviewed: 09/12/2012 [facility] Patient Information 2015 [place], [place]. This information is not intended to  replace advice given to you by your health care provider. Make sure you discuss any questions you have with your health care provider. Monitored Anesthesia Care Monitored anesthesia care is an anesthesia service for a medical procedure. Anesthesia is the loss of the ability to feel pain. It is produced by medicines called anesthetics. It may affect a small area of your body (local anesthesia), a large area of your body (regional anesthesia), or your entire body  (general anesthesia). The need for monitored anesthesia care depends your procedure, your condition, and the potential need for regional or general anesthesia. It is often provided during procedures where:   General anesthesia may be needed if there are complications. This is because you need special care when you are under general anesthesia.   You will be under local or regional anesthesia. This is so that you are able to have higher levels of anesthesia if needed.   You will receive calming medicines (sedatives). This is especially the case if sedatives are given to put you in a semi-conscious state of relaxation (deep sedation). This is because the amount of sedative needed to produce this state can be hard to predict. Too much of a sedative can produce general anesthesia. Monitored anesthesia care is performed by one or more health care providers who have special training in all types of anesthesia. You will need to meet with these health care providers before your procedure. During this meeting, they will ask you about your medical history. They will also give you instructions to follow. (For example, you will need to stop eating and drinking before your procedure. You may also need to stop or change medicines you are taking.) During your procedure, your health care providers will stay with you. They will:   Watch your condition. This includes watching your blood pressure, breathing, and level of pain.   Diagnose and treat problems that occur.   Give medicines if they are needed. These may include calming medicines (sedatives) and anesthetics.   Make sure you are comfortable.  Having monitored anesthesia care does not necessarily mean that you will be under anesthesia. It does mean that your health care providers will be able to manage anesthesia if you need it or if it occurs. It also means that you will be able to have a different type of anesthesia than you are having if you need it.  When your procedure is complete, your health care providers will continue to watch your condition. They will make sure any medicines wear off before you are allowed to go home.  Document Released: 10/01/2004 Document Revised: 05/22/2013 Document Reviewed: 02/17/2012 [facility] Patient Information 2015 [place], [place]. This information is not intended to replace advice given to you by your health care provider. Make sure you discuss any questions you have with your health care provider.

## 2014-09-17 ENCOUNTER — Encounter (HOSPITAL_COMMUNITY): Payer: Self-pay

## 2014-09-17 ENCOUNTER — Encounter (HOSPITAL_COMMUNITY)
Admission: RE | Admit: 2014-09-17 | Discharge: 2014-09-17 | Disposition: A | Discharge status: Home / Self Care | Payer: Medicare Other | Source: Ambulatory Visit | Attending: Internal Medicine | Admitting: Internal Medicine

## 2014-09-17 DIAGNOSIS — J449 Chronic obstructive pulmonary disease, unspecified: Secondary | ICD-10-CM | POA: Diagnosis not present

## 2014-09-17 DIAGNOSIS — F17210 Nicotine dependence, cigarettes, uncomplicated: Secondary | ICD-10-CM | POA: Diagnosis not present

## 2014-09-17 DIAGNOSIS — F329 Major depressive disorder, single episode, unspecified: Secondary | ICD-10-CM | POA: Diagnosis not present

## 2014-09-17 DIAGNOSIS — K5900 Constipation, unspecified: Secondary | ICD-10-CM | POA: Diagnosis not present

## 2014-09-17 DIAGNOSIS — Z79899 Other long term (current) drug therapy: Secondary | ICD-10-CM | POA: Diagnosis not present

## 2014-09-17 DIAGNOSIS — E559 Vitamin D deficiency, unspecified: Secondary | ICD-10-CM | POA: Diagnosis not present

## 2014-09-17 DIAGNOSIS — R110 Nausea: Secondary | ICD-10-CM | POA: Diagnosis not present

## 2014-09-17 DIAGNOSIS — R194 Change in bowel habit: Secondary | ICD-10-CM | POA: Diagnosis not present

## 2014-09-17 LAB — CBC WITH DIFFERENTIAL/PLATELET
BASOS ABS: 0 10*3/uL (ref 0.0–0.1)
BASOS PCT: 0 % (ref 0–1)
EOS ABS: 0.1 10*3/uL (ref 0.0–0.7)
Eosinophils Relative: 1 % (ref 0–5)
HCT: 36.5 % (ref 36.0–46.0)
HEMOGLOBIN: 12.2 g/dL (ref 12.0–15.0)
Lymphocytes Relative: 26 % (ref 12–46)
Lymphs Abs: 2.7 10*3/uL (ref 0.7–4.0)
MCH: 30.6 pg (ref 26.0–34.0)
MCHC: 33.4 g/dL (ref 30.0–36.0)
MCV: 91.5 fL (ref 78.0–100.0)
MONOS PCT: 6 % (ref 3–12)
Monocytes Absolute: 0.7 10*3/uL (ref 0.1–1.0)
NEUTROS PCT: 67 % (ref 43–77)
Neutro Abs: 7 10*3/uL (ref 1.7–7.7)
Platelets: 153 10*3/uL (ref 150–400)
RBC: 3.99 MIL/uL (ref 3.87–5.11)
RDW: 13.7 % (ref 11.5–15.5)
WBC: 10.5 10*3/uL (ref 4.0–10.5)

## 2014-09-17 LAB — BASIC METABOLIC PANEL
Anion gap: 5 (ref 5–15)
BUN: 22 mg/dL — ABNORMAL HIGH (ref 6–20)
CHLORIDE: 102 mmol/L (ref 101–111)
CO2: 30 mmol/L (ref 22–32)
CREATININE: 0.77 mg/dL (ref 0.44–1.00)
Calcium: 9.4 mg/dL (ref 8.9–10.3)
GFR calc non Af Amer: >60 mL/min (ref >60)
Glucose, Bld: 72 mg/dL (ref 65–99)
Potassium: 4.4 mmol/L (ref 3.5–5.1)
SODIUM: 137 mmol/L (ref 135–145)

## 2014-09-17 LAB — HCG, SERUM, QUALITATIVE: PREG SERUM: NEGATIVE

## 2014-09-18 ENCOUNTER — Encounter: Payer: Self-pay | Admitting: Family Medicine

## 2014-09-18 ENCOUNTER — Ambulatory Visit (INDEPENDENT_AMBULATORY_CARE_PROVIDER_SITE_OTHER): Payer: Medicare Other | Admitting: Family Medicine

## 2014-09-18 VITALS — BP 104/70 | HR 75 | Resp 16 | Ht 66.0 in | Wt 142.0 lb

## 2014-09-18 DIAGNOSIS — F329 Major depressive disorder, single episode, unspecified: Secondary | ICD-10-CM | POA: Diagnosis not present

## 2014-09-18 DIAGNOSIS — J45991 Cough variant asthma: Secondary | ICD-10-CM

## 2014-09-18 DIAGNOSIS — F17208 Nicotine dependence, unspecified, with other nicotine-induced disorders: Secondary | ICD-10-CM | POA: Diagnosis not present

## 2014-09-18 DIAGNOSIS — F17210 Nicotine dependence, cigarettes, uncomplicated: Secondary | ICD-10-CM | POA: Diagnosis not present

## 2014-09-18 DIAGNOSIS — R1013 Epigastric pain: Secondary | ICD-10-CM

## 2014-09-18 DIAGNOSIS — R634 Abnormal weight loss: Secondary | ICD-10-CM

## 2014-09-18 DIAGNOSIS — F32A Depression, unspecified: Secondary | ICD-10-CM

## 2014-09-18 DIAGNOSIS — Z23 Encounter for immunization: Secondary | ICD-10-CM | POA: Diagnosis not present

## 2014-09-18 NOTE — Patient Instructions (Addendum)
Annual wellness in February, call if you need me before  Flu vaccine today  You need to see a therapist as well as a psychiatrist, please keep your appointment tomorrow and be open and honest with your Provider, keep follow up appointments also  I willl give you a note to take with you as discussed   All the best with your colonoscopy  PleaSE PLAN TO stop SMOKING ENTIRELY  Thanks for choosing [facility], we consider it a privelige to serve you.

## 2014-09-20 ENCOUNTER — Encounter (HOSPITAL_COMMUNITY)
Admission: RE | Disposition: A | Discharge status: Home / Self Care | Payer: Self-pay | Source: Ambulatory Visit | Attending: Internal Medicine

## 2014-09-20 ENCOUNTER — Ambulatory Visit (HOSPITAL_COMMUNITY)
Admission: RE | Admit: 2014-09-20 | Discharge: 2014-09-20 | Disposition: A | Discharge status: Home / Self Care | Payer: Medicare Other | Source: Ambulatory Visit | Attending: Internal Medicine | Admitting: Internal Medicine

## 2014-09-20 ENCOUNTER — Ambulatory Visit (HOSPITAL_COMMUNITY): Payer: Medicare Other | Admitting: Anesthesiology

## 2014-09-20 ENCOUNTER — Encounter (HOSPITAL_COMMUNITY): Payer: Self-pay | Admitting: *Deleted

## 2014-09-20 DIAGNOSIS — R194 Change in bowel habit: Secondary | ICD-10-CM | POA: Insufficient documentation

## 2014-09-20 DIAGNOSIS — Z79899 Other long term (current) drug therapy: Secondary | ICD-10-CM | POA: Diagnosis not present

## 2014-09-20 DIAGNOSIS — K5900 Constipation, unspecified: Secondary | ICD-10-CM | POA: Insufficient documentation

## 2014-09-20 DIAGNOSIS — F329 Major depressive disorder, single episode, unspecified: Secondary | ICD-10-CM | POA: Insufficient documentation

## 2014-09-20 DIAGNOSIS — J449 Chronic obstructive pulmonary disease, unspecified: Secondary | ICD-10-CM | POA: Diagnosis not present

## 2014-09-20 DIAGNOSIS — E559 Vitamin D deficiency, unspecified: Secondary | ICD-10-CM | POA: Insufficient documentation

## 2014-09-20 DIAGNOSIS — R110 Nausea: Secondary | ICD-10-CM | POA: Insufficient documentation

## 2014-09-20 DIAGNOSIS — F17210 Nicotine dependence, cigarettes, uncomplicated: Secondary | ICD-10-CM | POA: Insufficient documentation

## 2014-09-20 DIAGNOSIS — R1013 Epigastric pain: Secondary | ICD-10-CM | POA: Diagnosis not present

## 2014-09-20 HISTORY — PX: COLONOSCOPY WITH PROPOFOL: SHX5780

## 2014-09-20 SURGERY — COLONOSCOPY WITH PROPOFOL
Anesthesia: Monitor Anesthesia Care

## 2014-09-20 MED ORDER — SODIUM CHLORIDE 0.9 % IJ SOLN
INTRAMUSCULAR | Status: AC
  Filled 2014-09-20: qty 10

## 2014-09-20 MED ORDER — MIDAZOLAM HCL 2 MG/2ML IJ SOLN
INTRAMUSCULAR | Status: AC
  Filled 2014-09-20: qty 4

## 2014-09-20 MED ORDER — ONDANSETRON HCL 4 MG/2ML IJ SOLN
INTRAMUSCULAR | Status: AC
Start: 2014-09-20 — End: 2014-09-20
  Filled 2014-09-20: qty 2

## 2014-09-20 MED ORDER — SUCCINYLCHOLINE CHLORIDE 20 MG/ML IJ SOLN
INTRAMUSCULAR | Status: AC
  Filled 2014-09-20: qty 1

## 2014-09-20 MED ORDER — PROPOFOL INFUSION 10 MG/ML OPTIME
INTRAVENOUS | Status: DC | PRN
  Administered 2014-09-20: 150 ug/kg/min via INTRAVENOUS

## 2014-09-20 MED ORDER — FENTANYL CITRATE (PF) 100 MCG/2ML IJ SOLN
25.0000 ug | INTRAMUSCULAR | Status: AC
  Administered 2014-09-20 (×2): 25 ug via INTRAVENOUS

## 2014-09-20 MED ORDER — STERILE WATER FOR IRRIGATION IR SOLN
Status: DC | PRN
  Administered 2014-09-20: 1000 mL

## 2014-09-20 MED ORDER — LIDOCAINE HCL (CARDIAC) 10 MG/ML IV SOLN
INTRAVENOUS | Status: DC | PRN
  Administered 2014-09-20: 50 mg via INTRAVENOUS

## 2014-09-20 MED ORDER — PROPOFOL 10 MG/ML IV BOLUS
INTRAVENOUS | Status: AC
  Filled 2014-09-20: qty 20

## 2014-09-20 MED ORDER — LACTATED RINGERS IV SOLN
INTRAVENOUS | Status: DC
  Administered 2014-09-20: 1000 mL via INTRAVENOUS

## 2014-09-20 MED ORDER — FENTANYL CITRATE (PF) 100 MCG/2ML IJ SOLN
INTRAMUSCULAR | Status: AC
  Filled 2014-09-20: qty 4

## 2014-09-20 MED ORDER — LIDOCAINE HCL (PF) 1 % IJ SOLN
INTRAMUSCULAR | Status: AC
  Filled 2014-09-20: qty 5

## 2014-09-20 MED ORDER — FENTANYL CITRATE (PF) 100 MCG/2ML IJ SOLN
INTRAMUSCULAR | Status: DC | PRN
  Administered 2014-09-20 (×4): 25 ug via INTRAVENOUS

## 2014-09-20 MED ORDER — MIDAZOLAM HCL 2 MG/2ML IJ SOLN
INTRAMUSCULAR | Status: AC
  Filled 2014-09-20: qty 2

## 2014-09-20 MED ORDER — FENTANYL CITRATE (PF) 100 MCG/2ML IJ SOLN
INTRAMUSCULAR | Status: AC
  Filled 2014-09-20: qty 2

## 2014-09-20 MED ORDER — LACTATED RINGERS IV SOLN
INTRAVENOUS | Status: DC | PRN
  Administered 2014-09-20: 07:00:00 via INTRAVENOUS

## 2014-09-20 MED ORDER — MIDAZOLAM HCL 2 MG/2ML IJ SOLN
1.0000 mg | INTRAMUSCULAR | Status: DC | PRN
  Administered 2014-09-20 (×2): 2 mg via INTRAVENOUS

## 2014-09-20 MED ORDER — MIDAZOLAM HCL 5 MG/5ML IJ SOLN
INTRAMUSCULAR | Status: DC | PRN
  Administered 2014-09-20: 2 mg via INTRAVENOUS

## 2014-09-20 MED ORDER — EPHEDRINE SULFATE 50 MG/ML IJ SOLN
INTRAMUSCULAR | Status: AC
  Filled 2014-09-20: qty 1

## 2014-09-20 MED ORDER — ONDANSETRON HCL 4 MG/2ML IJ SOLN
4.0000 mg | Freq: Once | INTRAMUSCULAR | Status: AC
  Administered 2014-09-20: 4 mg via INTRAVENOUS

## 2014-09-20 SURGICAL SUPPLY — 22 items
ELECT REM PT RETURN 9FT ADLT (ELECTROSURGICAL)
ELECTRODE REM PT RTRN 9FT ADLT (ELECTROSURGICAL) IMPLANT
FCP BXJMBJMB 240X2.8X (CUTTING FORCEPS)
FLOOR PAD 36X40 (MISCELLANEOUS)
FORCEPS BIOP RAD 4 LRG CAP 4 (CUTTING FORCEPS) IMPLANT
FORCEPS BIOP RJ4 240 W/NDL (CUTTING FORCEPS)
FORCEPS BXJMBJMB 240X2.8X (CUTTING FORCEPS) IMPLANT
FORMALIN 10 PREFIL 20ML (MISCELLANEOUS) IMPLANT
INJECTOR/SNARE I SNARE (MISCELLANEOUS) IMPLANT
KIT ENDO PROCEDURE PEN (KITS) ×3 IMPLANT
MANIFOLD NEPTUNE II (INSTRUMENTS) ×2 IMPLANT
NDL SCLEROTHERAPY 25GX240 (NEEDLE) IMPLANT
NEEDLE SCLEROTHERAPY 25GX240 (NEEDLE) IMPLANT
PAD FLOOR 36X40 (MISCELLANEOUS) IMPLANT
PROBE APC STR FIRE (PROBE) IMPLANT
PROBE INJECTION GOLD (MISCELLANEOUS)
PROBE INJECTION GOLD 7FR (MISCELLANEOUS) IMPLANT
SNARE ROTATE MED OVAL 20MM (MISCELLANEOUS) IMPLANT
SNARE SHORT THROW 13M SML OVAL (MISCELLANEOUS) IMPLANT
TRAP SPECIMEN MUCOUS 40CC (MISCELLANEOUS) IMPLANT
TUBING IRRIGATION ENDOGATOR (MISCELLANEOUS) ×2 IMPLANT
WATER STERILE IRR 1000ML POUR (IV SOLUTION) ×2 IMPLANT

## 2014-09-20 NOTE — Anesthesia Postprocedure Evaluation (Signed)
  Anesthesia Post-op Note  Patient: Tracey Daniel  Procedure(s) Performed: Procedure(s) with comments: COLONOSCOPY WITH PROPOFOL (N/A) - [place] time in 0815   time out 0825   total time 10 minutes  Patient Location: PACU  Anesthesia Type:MAC  Level of Consciousness: awake, alert , oriented and patient cooperative  Airway and Oxygen Therapy: Patient Spontanous Breathing and Patient connected to face mask oxygen  Post-op Pain: none  Post-op Assessment: Post-op Vital signs reviewed, Patient's Cardiovascular Status Stable, Respiratory Function Stable, Patent Airway, No signs of Nausea or vomiting and Pain level controlled              Post-op Vital Signs: Reviewed and stable  Last Vitals:  Filed Vitals:   09/20/14 0755  BP: 109/69  Pulse:   Temp:   Resp: 27    Complications: No apparent anesthesia complications

## 2014-09-20 NOTE — Transfer of Care (Signed)
Immediate Anesthesia Transfer of Care Note  Patient: Tracey Daniel  Procedure(s) Performed: Procedure(s) with comments: COLONOSCOPY WITH PROPOFOL (N/A) - Cecum time in 0815   time out 0825   total time 10 minutes  Patient Location: PACU  Anesthesia Type:MAC  Level of Consciousness: awake, alert , oriented and patient cooperative  Airway & Oxygen Therapy: Patient Spontanous Breathing and Patient connected to face mask oxygen  Post-op Assessment: Report given to RN and Post -op Vital signs reviewed and stable  Post vital signs: Reviewed and stable  Last Vitals:  Filed Vitals:   09/20/14 0755  BP: 109/69  Pulse:   Temp:   Resp: 27    Complications: No apparent anesthesia complications

## 2014-09-20 NOTE — Op Note (Signed)
[facility] [address] [place], [address]   COLONOSCOPY PROCEDURE REPORT  PATIENT: Tracey Daniel, Tracey Daniel  MR#: [id number] BIRTHDATE: October 29, 1980 , 34  yrs. old GENDER: female ENDOSCOPIST: R.  Garfield Cornea, MD FACP [facility] REFERRED NO:[id number] Moshe Cipro, M.D. PROCEDURE DATE:  10/17/2014 PROCEDURE:   Colonoscopy, diagnostic INDICATIONS:change in bowel habits; symptoms better with increased dose of Linzess. MEDICATIONS: Deep sedation per Dr.  [facility] ASA CLASS:       Class II  CONSENT: The risks, benefits, alternatives and imponderables including but not limited to bleeding, perforation as well as the possibility of a missed lesion have been reviewed.  The potential for biopsy, lesion removal, etc. have also been discussed. Questions have been answered.  All parties agreeable.  Please see the history and physical in the medical record for more information.  DESCRIPTION OF PROCEDURE:   After the risks benefits and alternatives of the procedure were thoroughly explained, informed consent was obtained.  The digital rectal exam revealed no abnormalities of the rectum.   The     endoscope was introduced through the anus and advanced to the terminal ileum which was intubated for a short distance. No adverse events experienced. The quality of the prep was adequate  The instrument was then slowly withdrawn as the colon was fully examined. Estimated blood loss is zero unless otherwise noted in this procedure report.      COLON FINDINGS: Normal-appearing rectal mucosa.  Normal-appearing colonic mucosa.  Distal 5 cm of terminal ileum mucosa also appeared normal.  Retroflexion was performed. .  Withdrawal time=10 minutes 0 seconds.  The scope was withdrawn and the procedure completed. COMPLICATIONS: There were no immediate complications.  ENDOSCOPIC IMPRESSION: Normal ileo-colonoscopy  RECOMMENDATIONS: Continue Linzess 290 daily. Office visit with [place]  in 2 months.  eSigned:  R. Garfield Cornea, MD Rosalita Chessman [facility] October 17, 2014 8:39 AM   cc:  CPT CODES: ICD CODES:  The ICD and CPT codes recommended by this software are interpretations from the data that the clinical staff has captured with the software.  The verification of the translation of this report to the ICD and CPT codes and modifiers is the sole responsibility of the health care institution and practicing physician where this report was generated.  [employer] will not be held responsible for the validity of the ICD and CPT codes included on this report.  AMA assumes no liability for data contained or not contained herein. CPT is a Designer, television/film set of the [employer].  PATIENT NAME:  Tracey Daniel, Tracey Daniel MR#: [id number]

## 2014-09-20 NOTE — Interval H&P Note (Signed)
History and Physical Interval Note:  09/20/2014 7:50 AM  Tracey Daniel  has presented today for surgery, with the diagnosis of change in bowel habits, constipation, epigastric pain  The various methods of treatment have been discussed with the patient and family. After consideration of risks, benefits and other options for treatment, the patient has consented to  Procedure(s) with comments: COLONOSCOPY WITH PROPOFOL (N/A) - 0800 as a surgical intervention .  The patient's history has been reviewed, patient examined, no change in status, stable for surgery.  I have reviewed the patient's chart and labs.  Questions were answered to the patient's satisfaction.     Terri Rorrer  No change (linzess 290 working better for bowels).  Dx tcs per plan. The risks, benefits, limitations, alternatives and imponderables have been reviewed with the patient. Questions have been answered. All parties are agreeable.

## 2014-09-20 NOTE — Discharge Instructions (Signed)
Colonoscopy Discharge Instructions  Read the instructions outlined below and refer to this sheet in the next few weeks. These discharge instructions provide you with general information on caring for yourself after you leave the hospital. Your doctor may also give you specific instructions. While your treatment has been planned according to the most current medical practices available, unavoidable complications occasionally occur. If you have any problems or questions after discharge, call Dr. Gala Romney at [phone]. ACTIVITY  You may resume your regular activity, but move at a slower pace for the next 24 hours.   Take frequent rest periods for the next 24 hours.   Walking will help get rid of the air and reduce the bloated feeling in your belly (abdomen).   No driving for 24 hours (because of the medicine (anesthesia) used during the test).    Do not sign any important legal documents or operate any machinery for 24 hours (because of the anesthesia used during the test).  NUTRITION  Drink plenty of fluids.   You may resume your normal diet as instructed by your doctor.   Begin with a light meal and progress to your normal diet. Heavy or fried foods are harder to digest and may make you feel sick to your stomach (nauseated).   Avoid alcoholic beverages for 24 hours or as instructed.  MEDICATIONS  You may resume your normal medications unless your doctor tells you otherwise.  WHAT YOU CAN EXPECT TODAY  Some feelings of bloating in the abdomen.   Passage of more gas than usual.   Spotting of blood in your stool or on the toilet paper.  IF YOU HAD POLYPS REMOVED DURING THE COLONOSCOPY:  No aspirin products for 7 days or as instructed.   No alcohol for 7 days or as instructed.   Eat a soft diet for the next 24 hours.  FINDING OUT THE RESULTS OF YOUR TEST Not all test results are available during your visit. If your test results are not back during the visit, make an appointment  with your caregiver to find out the results. Do not assume everything is normal if you have not heard from your caregiver or the medical facility. It is important for you to follow up on all of your test results.  SEEK IMMEDIATE MEDICAL ATTENTION IF:  You have more than a spotting of blood in your stool.   Your belly is swollen (abdominal distention).   You are nauseated or vomiting.   You have a temperature over 101.   You have abdominal pain or discomfort that is severe or gets worse throughout the day.   Continue Linzess 290 daily  Information on constipation provided  Office visit with [place] 2 months  Office visit with [place] in 2 months  Constipation Constipation is when a person has fewer than three bowel movements a week, has difficulty having a bowel movement, or has stools that are dry, hard, or larger than normal. As people grow older, constipation is more common. If you try to fix constipation with medicines that make you have a bowel movement (laxatives), the problem may get worse. Long-term laxative use may cause the muscles of the colon to become weak. A low-fiber diet, not taking in enough fluids, and taking certain medicines may make constipation worse.  CAUSES   Certain medicines, such as antidepressants, pain medicine, iron supplements, antacids, and water pills.   Certain diseases, such as diabetes, irritable bowel syndrome (IBS), thyroid disease, or depression.   Not drinking enough  water.   Not eating enough fiber-rich foods.   Stress or travel.   Lack of physical activity or exercise.   Ignoring the urge to have a bowel movement.   Using laxatives too much.  SIGNS AND SYMPTOMS   Having fewer than three bowel movements a week.   Straining to have a bowel movement.   Having stools that are hard, dry, or larger than normal.   Feeling full or bloated.   Pain in the lower abdomen.   Not feeling relief after having a bowel movement.   DIAGNOSIS  Your health care provider will take a medical history and perform a physical exam. Further testing may be done for severe constipation. Some tests may include:  A barium enema X-ray to examine your rectum, colon, and, sometimes, your small intestine.   A sigmoidoscopy to examine your lower colon.   A colonoscopy to examine your entire colon. TREATMENT  Treatment will depend on the severity of your constipation and what is causing it. Some dietary treatments include drinking more fluids and eating more fiber-rich foods. Lifestyle treatments may include regular exercise. If these diet and lifestyle recommendations do not help, your health care provider may recommend taking over-the-counter laxative medicines to help you have bowel movements. Prescription medicines may be prescribed if over-the-counter medicines do not work.  HOME CARE INSTRUCTIONS   Eat foods that have a lot of fiber, such as fruits, vegetables, whole grains, and beans.  Limit foods high in fat and processed sugars, such as french fries, hamburgers, cookies, candies, and soda.   A fiber supplement may be added to your diet if you cannot get enough fiber from foods.   Drink enough fluids to keep your urine clear or pale yellow.   Exercise regularly or as directed by your health care provider.   Go to the restroom when you have the urge to go. Do not hold it.   Only take over-the-counter or prescription medicines as directed by your health care provider. Do not take other medicines for constipation without talking to your health care provider first.  [place] IF:   You have bright red blood in your stool.   Your constipation lasts for more than 4 days or gets worse.   You have abdominal or rectal pain.   You have thin, pencil-like stools.   You have unexplained weight loss. MAKE SURE YOU:   Understand these instructions.  Will watch your condition.  Will get help  right away if you are not doing well or get worse. Document Released: 10/04/2003 Document Revised: 01/10/2013 Document Reviewed: 10/17/2012 [facility] Patient Information 2015 [place], [place]. This information is not intended to replace advice given to you by your health care provider. Make sure you discuss any questions you have with your health care provider.

## 2014-09-20 NOTE — H&P (View-Only) (Signed)
    Primary Care Physician: Margaret Simpson, MD  Primary Gastroenterologist:  Michael Rourk, MD   Chief Complaint  Patient presents with  . Abdominal Pain    HPI: Tracey Daniel is a 34 y.o. female here for follow-up. She was last seen in October 2015. She has a history of gastroesophageal reflux disease, pylorospasm resulting gastric outlet obstruction requiring Botox, constipation. Last Botox injection was by Dr. Hayes during hospitalization in [place] back in January 2015. Subsequently followed up with Dr.Koch at [facility] last year. Additional gastric emptying study normal at 2 and 4 hours. He referred her to pain clinic for abdominal wall syndrome/positive Carnett's sign. Patient had 100% relief of her abdominal pain status post celiac plexus block via splanchnic approach on 03/27/2014. Was on gabapentin 300mg tid then. Unclear how much she is on now if any, 100mg TID listed today on drug list.   Patient states she was doing fairly well until 05/2014. She has required injections for chronic back pain, cyst on spine. Back in May caught a "stomach virus". Developed vomiting for a few days, no diarrhea. Chronically has had problems with her bowel movements. We have seen in the past for chronic constipation the last year was having more issues with diarrhea. Since may her constipation has dramatically worsened having difficulty having a bowel movement even once per week. Has tried Linzess 145mcg daily and Miralax without relief. Two abdominal films recently without obstruction. Abdominal discomfort in the lower abdomen, unrelieved with bowel movements. Denies blood in the stool. Some nausea but no vomiting. Continues to have epigastric burning even on pantoprazole 40 mg twice a day. Seems to be worse at night. Carafate used to help but she needs a new prescription. Denies dysphagia. Continues to have some epigastric abdominal wall pain. Overdue for follow-up at the pain clinic. Of note, she  has received chronic hydrocodone from Dr. Keeling and and more recently from Dr. Harkins (neurosurgery). Per patient, they are weaning her off, recently decreased dosage.      Current Outpatient Prescriptions  Medication Sig Dispense Refill  . albuterol (PROVENTIL HFA;VENTOLIN HFA) 108 (90 BASE) MCG/ACT inhaler Inhale 2 puffs into the lungs every 6 (six) hours as needed. Shortness of breath (Patient taking differently: Inhale 2 puffs into the lungs every 6 (six) hours as needed for wheezing or shortness of breath. Shortness of breath) 6.7 g 4  . budesonide-formoterol (SYMBICORT) 80-4.5 MCG/ACT inhaler Inhale 2 puffs into the lungs 2 (two) times daily. (Patient taking differently: Inhale 2 puffs into the lungs every evening. ) 1 Inhaler 4  . Cholecalciferol (VITAMIN D3) 2000 UNITS CHEW Chew 1 capsule by mouth daily.     . clobetasol cream (TEMOVATE) 0.05 % Apply 1 application topically 2 (two) times daily as needed (rash). Apply to rash    . DULoxetine (CYMBALTA) 30 MG capsule Take 1 capsule (30 mg total) by mouth daily. 30 capsule 3  . ergocalciferol (VITAMIN D2) 50000 UNITS capsule Take 1 capsule (50,000 Units total) by mouth once a week. One capsule once weekly 12 capsule 1  . estradiol (ESTRACE VAGINAL) 0.1 MG/GM vaginal cream Place 2 g vaginally at bedtime. As directed    . feeding supplement (ENSURE CLINICAL STRENGTH) LIQD Take 237 mLs by mouth 3 (three) times daily with meals. 10 Bottle 3  . ferrous sulfate 325 (65 FE) MG tablet Take 325 mg by mouth daily with breakfast.    . fluticasone (FLONASE) 50 MCG/ACT nasal spray Place 2 sprays into both nostrils   daily.    . gabapentin (NEURONTIN) 100 MG capsule Take 100 mg by mouth 3 (three) times daily.    . HYDROcodone-acetaminophen (NORCO/VICODIN) 5-325 MG per tablet Take 1 tablet by mouth 2 (two) times daily.    . loratadine (CLARITIN) 10 MG tablet Take 1 tablet (10 mg total) by mouth daily. 30 tablet 5  . mometasone (NASONEX) 50 MCG/ACT nasal  spray Place 2 sprays into the nose 2 (two) times daily. 17 g 12  . ondansetron (ZOFRAN) 4 MG tablet Take 1 tablet (4 mg total) by mouth every 6 (six) hours as needed for nausea or vomiting. 30 tablet 1  . pantoprazole (PROTONIX) 40 MG tablet Take 1 tablet (40 mg total) by mouth 2 (two) times daily before a meal. 180 tablet 3  . promethazine (PROMETHEGAN) 25 MG suppository Place 1 suppository (25 mg total) rectally every 6 (six) hours as needed for nausea or vomiting. 6 each 0  . simvastatin (ZOCOR) 20 MG tablet Take 20 mg by mouth daily.    . sucralfate (CARAFATE) 1 GM/10ML suspension Take 10 mLs (1 g total) by mouth 4 (four) times daily -  with meals and at bedtime. 420 mL 1  . tiZANidine (ZANAFLEX) 4 MG tablet Take 4 mg by mouth every 6 (six) hours as needed for muscle spasms.     . calcium-vitamin D (OSCAL 500/200 D-3) 500-200 MG-UNIT per tablet Take 1 tablet by mouth 2 (two) times daily. 180 tablet 3   No current facility-administered medications for this visit.    Allergies as of 08/27/2014 - Review Complete 08/27/2014  Allergen Reaction Noted  . Benadryl [diphenhydramine] Anaphylaxis 03/25/2014  . Latex Itching and Other (See Comments) 11/30/2011  . Penicillins     Past Medical History  Diagnosis Date  . Migraines   . Osteoporosis   . Seasonal allergies   . Sinusitis   . Back pain, chronic   . Nicotine addiction   . Constipation   . Vitamin D deficiency   . Gastritis   . Gastroparesis   . Pyloric spasm 03/30/2011  . Gastric outlet obstruction   . COPD (chronic obstructive pulmonary disease)   . Asthma   . Substance abuse 2008    marijuana  . Peptic ulcer disease   . Depression 2000    h/o suicidal ideation  . Anemia of other chronic disease 11/29/2012  . Chronic abdominal pain   . Nausea and vomiting     recurrent   Past Surgical History  Procedure Laterality Date  . Cholecystectomy      ?2002  . Laser surgery on cervix    . Carpal tunnel release      left hand    . Esophagogastroduodenoscopy  12/25/2010    Sandi M Fields, MD;  moderate gastritis, ?goo secondary to pylorspasm. BX showed reactive gstropathy no h.pyori, SB mucosa with intramucosal lymphocytosis and partial villous blunting (TTG 4.0 normal)  . Esophageal dilation  12/25/2010    Procedure: ESOPHAGEAL DILATION;  Surgeon: Sandi M Fields, MD;  Location: [facility];  Service: Endoscopy;;  . Esophagogastroduodenoscopy N/A 11/14/2012    RMR:Small hiatal hernia. Abnormal gastric mucosa of  uncertain significance-status post biopsy. Subjectively, patient may have recurrent symptomatic, pylorospasm.  . Esophagogastroduodenoscopy (egd) with propofol N/A 02/09/2013    elongated stomach, partial lower esophageal ring widely patent. No obvious pyloric stenosis s/p Botox  . Botox injection N/A 02/09/2013    Procedure: BOTOX INJECTION;  Surgeon: John C Hayes, MD;  Location: [facility];    Service: Endoscopy;  Laterality: N/A;  possible balloon  . Balloon dilation N/A 02/09/2013    Procedure: BALLOON DILATION;  Surgeon: John C Hayes, MD;  Location: [facility];  Service: Endoscopy;  Laterality: N/A;   Family History  Problem Relation Age of Onset  . Diabetes Father   . Liver disease Father     liver transplant at [facility], age 65  . Colon cancer Neg Hx   . Lung cancer Mother 45  . Heart disease Maternal Grandmother   . Parkinson's disease Maternal Grandfather   . Multiple sclerosis Sister 25  . Depression Sister 16    bipolar and schizophrenic  . Alcohol abuse Sister    History   Social History  . Marital Status: Single    Spouse Name: N/A  . Number of Children: 0  . Years of Education: N/A   Occupational History  . disability    Social History Main Topics  . Smoking status: Current Every Day Smoker -- 15 years    Types: Cigarettes  . Smokeless tobacco: Never Used     Comment: smokes one cigarette daily  . Alcohol Use: No     Comment: none since July. Periodic heavy drinker for months at a  time.  . Drug Use: No  . Sexual Activity: Yes    Birth Control/ Protection: None   Other Topics Concern  . None   Social History Narrative    ROS:  General: Negative for anorexia, weight loss, fever, chills, + fatigue,  weakness. ENT: Negative for hoarseness, difficulty swallowing , nasal congestion. CV: Negative for chest pain, angina, palpitations, dyspnea on exertion, peripheral edema.  Respiratory: Negative for dyspnea at rest, dyspnea on exertion, cough, sputum, wheezing.  GI: See history of present illness. GU:  Negative for dysuria, hematuria, urinary incontinence, urinary frequency, nocturnal urination.  Endo: Negative for unusual weight change.  NEURO: states sometimes she feels like she is going to pass out.   Physical Examination:   BP 97/61 mmHg  Pulse 66  Temp(Src) 97.3 F (36.3 C) (Oral)  Ht 5' 6" (1.676 m)  Wt 138 lb (62.596 kg)  BMI 22.28 kg/m2  LMP 01/19/1997  General: Well-nourished, well-developed in no acute distress.  Eyes: No icterus. Mouth: Oropharyngeal mucosa moist and pink , no lesions erythema or exudate. Lungs: Clear to auscultation bilaterally.  Heart: Regular rate and rhythm, no murmurs rubs or gallops.  Abdomen: Bowel sounds are normal, minimal epigastric tenderness (+Carnett's sign), nondistended, no hepatosplenomegaly or masses, no abdominal bruits or hernia , no rebound or guarding.   Extremities: No lower extremity edema. No clubbing or deformities. Neuro: Alert and oriented x 4   Skin: Warm and dry, no jaundice.   Psych: Alert and cooperative, normal mood and affect.  Labs:  Lab Results  Component Value Date   WBC 9.9 07/31/2014   HGB 12.6 07/31/2014   HCT 38.0 07/31/2014   MCV 91.6 07/31/2014   PLT 205 07/31/2014   Lab Results  Component Value Date   CREATININE 0.93 07/31/2014   BUN 11 07/31/2014   NA 140 07/31/2014   K 3.7 07/31/2014   CL 101 07/31/2014   CO2 28 07/31/2014   Lab Results  Component Value Date   ALT  18 07/31/2014   AST 23 07/31/2014   ALKPHOS 76 07/31/2014   BILITOT 0.8 07/31/2014   Lab Results  Component Value Date   LIPASE 16* 07/31/2014    Imaging Studies: Dg Abd Acute W/chest  07/31/2014   CLINICAL   DATA:  Generalized chest pain on the left side along with nausea for 4 days. History of gastric outlet obstruction.  EXAM: DG ABDOMEN ACUTE W/ 1V CHEST  COMPARISON:  07/28/2014  FINDINGS: The cardiac silhouette, mediastinal and hilar contours are within normal limits and stable. The lungs are clear. No pleural effusion.  Two views of the abdomen demonstrate moderate air and stool throughout the colon and down into the rectum which may suggest mild constipation. No dilated small bowel loops to suggest obstruction. No free air. The soft tissue shadows are maintained. No worrisome calcifications. The bony structures are unremarkable.  IMPRESSION: No acute cardiopulmonary findings.  No plain film findings for an acute abdominal process.   Electronically Signed   By: P.  Gallerani M.D.   On: 07/31/2014 09:15   Dg Abd Acute W/chest  07/28/2014   CLINICAL DATA:  Generalized abdominal pain, nausea, vomiting, diarrhea.  EXAM: DG ABDOMEN ACUTE W/ 1V CHEST  COMPARISON:  July 13, 2014.  FINDINGS: There is no evidence of dilated bowel loops or free intraperitoneal air. Status post cholecystectomy. Small phlebolith seen in the right side of the pelvis. Heart size and mediastinal contours are within normal limits. Both lungs are clear.  IMPRESSION: No evidence of bowel obstruction or ileus. No acute cardiopulmonary disease.   Electronically Signed   By: James  Green Jr, M.D.   On: 07/28/2014 17:37   Impression/Plan: 34-year-old female with history of pylorospasm with gastric outlet obstruction requiring intermittent Botox injection, last one in January 2015. Seems to be stable from that standpoint. She does have chronic epigastric pain, felt to be related to chronic abdominal wall syndrome. 100% relief of pain  status post celiac plexus block earlier this year, although has had some recurrent pain in the past month. Denies typical heartburn but has some burning in the epigastrium, previously responded to Carafate. I have encouraged her to follow-up with pain clinic at [facility] for chronic abdominal wall pain.  Presents today for change in bowel habits. She has had a history of constipation the past, when I saw her one year ago she was having diarrhea even after stopping her Linzess. For the past 3 months she's been having difficulty managing her constipation. May be secondary to narcotics but given bowel habit change in no previous colonoscopy, recommend colonoscopy in the near future. Plan for deep sedation in the OR due to chronic narcotics. I have discussed the risks, alternatives, benefits with regards to but not limited to the risk of reaction to medication, bleeding, infection, perforation and the patient is agreeable to proceed. Written consent to be obtained.        

## 2014-09-20 NOTE — Anesthesia Preprocedure Evaluation (Deleted)
Anesthesia Evaluation    Airway       Dental   Pulmonary Current Smoker,          Cardiovascular     Neuro/Psych    GI/Hepatic   Endo/Other    Renal/GU      Musculoskeletal   Abdominal   Peds  Hematology   Anesthesia Other Findings   Reproductive/Obstetrics                           Anesthesia Physical Anesthesia Plan Anesthesia Quick Evaluation  

## 2014-09-20 NOTE — Anesthesia Preprocedure Evaluation (Signed)
Anesthesia Evaluation  Patient identified by MRN, date of birth, ID band Patient awake    Reviewed: Allergy & Precautions, H&P , NPO status , Patient's Chart, lab work & pertinent test results  Airway Mallampati: II  TM Distance: >3 FB Neck ROM: Full    Dental no notable dental hx. (+) Teeth Intact   Pulmonary asthma , COPDCurrent Smoker,  breath sounds clear to auscultation  Pulmonary exam normal       Cardiovascular negative cardio ROS Normal cardiovascular examRhythm:Regular Rate:Normal     Neuro/Psych  Headaches, PSYCHIATRIC DISORDERS Depression negative neurological ROS  negative psych ROS   GI/Hepatic negative GI ROS, Neg liver ROS, PUD, GERD-  ,  Endo/Other  negative endocrine ROSHypothyroidism   Renal/GU negative Renal ROS  negative genitourinary   Musculoskeletal negative musculoskeletal ROS (+)   Abdominal   Peds negative pediatric ROS (+)  Hematology negative hematology ROS (+) anemia ,   Anesthesia Other Findings   Reproductive/Obstetrics negative OB ROS                             Anesthesia Physical Anesthesia Plan  ASA: III  Anesthesia Plan: MAC   Post-op Pain Management:    Induction: Intravenous  Airway Management Planned: Simple Face Mask  Additional Equipment:   Intra-op Plan:   Post-operative Plan:   Informed Consent: I have reviewed the patients History and Physical, chart, labs and discussed the procedure including the risks, benefits and alternatives for the proposed anesthesia with the patient or authorized representative who has indicated his/her understanding and acceptance.     Plan Discussed with:   Anesthesia Plan Comments:         Anesthesia Quick Evaluation

## 2014-09-21 ENCOUNTER — Telehealth: Payer: Self-pay | Admitting: Internal Medicine

## 2014-09-21 ENCOUNTER — Encounter (HOSPITAL_COMMUNITY): Payer: Self-pay | Admitting: Internal Medicine

## 2014-09-21 NOTE — Telephone Encounter (Signed)
Agree with recommendations provided.  

## 2014-09-21 NOTE — Telephone Encounter (Signed)
I called pt and she is not having any N/V, no bleeding. Just complains with soreness in her abdomen and a very slight pain in the bottom of her stomach. She is able to take food and liquids. She was just wanting to make sure her problems were normal and I told her it is not unusual to have a little soreness. If she gets worse she will go to the ED.

## 2014-09-21 NOTE — Telephone Encounter (Signed)
Pt called to let us know that she called the hospital and was directed to call us. She had a colonoscopy yesterday by [facility] and is having pain and bloating and wanted to know if this was normal and what should she do. Please call [phone]

## 2014-09-25 ENCOUNTER — Other Ambulatory Visit: Payer: Self-pay | Admitting: Gastroenterology

## 2014-09-25 DIAGNOSIS — M4696 Unspecified inflammatory spondylopathy, lumbar region: Secondary | ICD-10-CM | POA: Diagnosis not present

## 2014-09-25 DIAGNOSIS — M7138 Other bursal cyst, other site: Secondary | ICD-10-CM | POA: Diagnosis not present

## 2014-09-25 NOTE — Telephone Encounter (Signed)
Make sure she is taking Linzess 290 daily

## 2014-09-28 NOTE — Telephone Encounter (Signed)
Pt aware.

## 2014-09-30 DIAGNOSIS — Z23 Encounter for immunization: Secondary | ICD-10-CM | POA: Insufficient documentation

## 2014-09-30 NOTE — Assessment & Plan Note (Signed)
Has upcoming colonoscopy for further evaluation

## 2014-09-30 NOTE — Progress Notes (Signed)
   Subjective:    Patient ID: Tracey Daniel, female    DOB: 07-10-1980, 34 y.o.   MRN: [id number]  HPI  The PT is here for follow up and re-evaluation of chronic medical conditions, medication management and review of any available recent lab and radiology data.  Preventive health is updated, specifically  Cancer screening and Immunization.   Questions or concerns regarding consultations or procedures which the PT has had in the interim are  addressed. The PT denies any adverse reactions to current medications since the last visit.  There are no new concerns.  There are no specific complaints      Review of Systems See HPI Denies recent fever or chills. Denies sinus pressure, nasal congestion, ear pain or sore throat. Denies chest congestion, productive cough or wheezing. Denies chest pains, palpitations and leg swelling C/o  abdominal pain, nausea, poor appetite and weight loss   Denies dysuria, frequency, hesitancy or incontinence. Denies joint pain, swelling and limitation in mobility. Denies headaches, seizures, numbness, or tingling. C/o uncontrolled  depression, anxiety and  insomnia. Denies skin break down or rash.         Objective:   Physical Exam  BP 104/70 mmHg  Pulse 75  Resp 16  Ht 5\' 6"  (1.676 m)  Wt 142 lb (64.411 kg)  BMI 22.93 kg/m2  SpO2 96%  LMP 01/19/1997 Patient alert and oriented and in no cardiopulmonary distress.  HEENT: No facial asymmetry, EOMI,   oropharynx pink and moist.  Neck supple no JVD, no mass.  Chest: Clear to auscultation bilaterally.Decreased though adequate air entry, no wheeze or crackles  CVS: S1, S2 no murmurs, no S3.Regular rate.  ABD: Soft superficial epigastric tenderness, no guarding or rebound.   Ext: No edema  MS: Adequate ROM spine, shoulders, hips and knees.  Skin: Intact, no ulcerations or rash noted.  Psych: Good eye contact, flat  affect. Memory intact not anxious but  depressed appearing.  CNS: CN  2-12 intact, power,  normal throughout.no focal deficits noted.       Assessment & Plan:  Nicotine dependence Patient counseled for approximately 5 minutes regarding the health risks of ongoing nicotine use, specifically all types of cancer, heart disease, stroke and respiratory failure. The options available for help with cessation ,the behavioral changes to assist the process, and the option to either gradully reduce usage  Or abruptly stop.is also discussed. Pt is also encouraged to set specific goals in number of cigarettes used daily, as well as to set a quit date.  Number of cigarettes/cigars currently smoking daily: 2   Depression Untreated and uncontrolled. Pt has not been keeping appts with mental health Provider as she needs to. States she feels "not interested" , also states transpt to her visits are an issue. She has long standing mental illness and needs to  Resume regular care. She is not currently suicidal, homicidal or hallucinating. Has appt in am and I have encouraged her strongly to keep this appt and make it beneficial for her  Abnormal weight loss Has upcoming colonoscopy for further evaluation  Epigastric pain Following botox at [facility] for condition  Asthma, cough variant Controlled, no change in medication   Need for prophylactic vaccination and inoculation against influenza After obtaining informed consent, the vaccine is  administered by LPN.

## 2014-09-30 NOTE — Assessment & Plan Note (Signed)

## 2014-09-30 NOTE — Assessment & Plan Note (Signed)
Controlled, no change in medication  

## 2014-09-30 NOTE — Assessment & Plan Note (Signed)
After obtaining informed consent, the vaccine is  administered by LPN.  

## 2014-09-30 NOTE — Assessment & Plan Note (Signed)
Untreated and uncontrolled. Pt has not been keeping appts with mental health Provider as she needs to. States she feels "not interested" , also states transpt to her visits are an issue. She has long standing mental illness and needs to  Resume regular care. She is not currently suicidal, homicidal or hallucinating. Has appt in am and I have encouraged her strongly to keep this appt and make it beneficial for her

## 2014-09-30 NOTE — Assessment & Plan Note (Signed)
Following botox at [facility] for condition

## 2014-11-08 ENCOUNTER — Encounter (HOSPITAL_COMMUNITY): Payer: Self-pay | Admitting: Emergency Medicine

## 2014-11-08 ENCOUNTER — Emergency Department (HOSPITAL_COMMUNITY)
Admission: EM | Admit: 2014-11-08 | Discharge: 2014-11-08 | Disposition: A | Discharge status: Home / Self Care | Payer: Medicare Other | Attending: Emergency Medicine | Admitting: Emergency Medicine

## 2014-11-08 DIAGNOSIS — F329 Major depressive disorder, single episode, unspecified: Secondary | ICD-10-CM | POA: Insufficient documentation

## 2014-11-08 DIAGNOSIS — K5900 Constipation, unspecified: Secondary | ICD-10-CM | POA: Insufficient documentation

## 2014-11-08 DIAGNOSIS — J449 Chronic obstructive pulmonary disease, unspecified: Secondary | ICD-10-CM | POA: Diagnosis not present

## 2014-11-08 DIAGNOSIS — Z91040 Latex allergy status: Secondary | ICD-10-CM | POA: Diagnosis not present

## 2014-11-08 DIAGNOSIS — G43909 Migraine, unspecified, not intractable, without status migrainosus: Secondary | ICD-10-CM | POA: Insufficient documentation

## 2014-11-08 DIAGNOSIS — Z8711 Personal history of peptic ulcer disease: Secondary | ICD-10-CM | POA: Diagnosis not present

## 2014-11-08 DIAGNOSIS — Z3202 Encounter for pregnancy test, result negative: Secondary | ICD-10-CM | POA: Insufficient documentation

## 2014-11-08 DIAGNOSIS — Z9049 Acquired absence of other specified parts of digestive tract: Secondary | ICD-10-CM | POA: Diagnosis not present

## 2014-11-08 DIAGNOSIS — R112 Nausea with vomiting, unspecified: Secondary | ICD-10-CM | POA: Diagnosis not present

## 2014-11-08 DIAGNOSIS — Z880 Allergy status to penicillin: Secondary | ICD-10-CM | POA: Diagnosis not present

## 2014-11-08 DIAGNOSIS — M810 Age-related osteoporosis without current pathological fracture: Secondary | ICD-10-CM | POA: Insufficient documentation

## 2014-11-08 DIAGNOSIS — E559 Vitamin D deficiency, unspecified: Secondary | ICD-10-CM | POA: Diagnosis not present

## 2014-11-08 DIAGNOSIS — R1013 Epigastric pain: Secondary | ICD-10-CM | POA: Diagnosis present

## 2014-11-08 DIAGNOSIS — Z720 Tobacco use: Secondary | ICD-10-CM | POA: Diagnosis not present

## 2014-11-08 DIAGNOSIS — Z7951 Long term (current) use of inhaled steroids: Secondary | ICD-10-CM | POA: Insufficient documentation

## 2014-11-08 DIAGNOSIS — Z862 Personal history of diseases of the blood and blood-forming organs and certain disorders involving the immune mechanism: Secondary | ICD-10-CM | POA: Diagnosis not present

## 2014-11-08 DIAGNOSIS — G8929 Other chronic pain: Secondary | ICD-10-CM | POA: Insufficient documentation

## 2014-11-08 DIAGNOSIS — Z79899 Other long term (current) drug therapy: Secondary | ICD-10-CM | POA: Insufficient documentation

## 2014-11-08 LAB — COMPREHENSIVE METABOLIC PANEL
ALBUMIN: 5 g/dL (ref 3.5–5.0)
ALK PHOS: 70 U/L (ref 38–126)
ALT: 20 U/L (ref 14–54)
AST: 30 U/L (ref 15–41)
Anion gap: 13 (ref 5–15)
BILIRUBIN TOTAL: 0.7 mg/dL (ref 0.3–1.2)
BUN: 23 mg/dL — AB (ref 6–20)
CALCIUM: 10 mg/dL (ref 8.9–10.3)
CO2: 23 mmol/L (ref 22–32)
CREATININE: 0.8 mg/dL (ref 0.44–1.00)
Chloride: 101 mmol/L (ref 101–111)
GFR calc Af Amer: >60 mL/min (ref >60)
GFR calc non Af Amer: >60 mL/min (ref >60)
GLUCOSE: 141 mg/dL — AB (ref 65–99)
Potassium: 3.5 mmol/L (ref 3.5–5.1)
SODIUM: 137 mmol/L (ref 135–145)
TOTAL PROTEIN: 8.2 g/dL — AB (ref 6.5–8.1)

## 2014-11-08 LAB — CBC WITH DIFFERENTIAL/PLATELET
BASOS ABS: 0 10*3/uL (ref 0.0–0.1)
BASOS PCT: 0 %
Eosinophils Absolute: 0 10*3/uL (ref 0.0–0.7)
Eosinophils Relative: 0 %
HEMATOCRIT: 37.1 % (ref 36.0–46.0)
HEMOGLOBIN: 12.5 g/dL (ref 12.0–15.0)
Lymphocytes Relative: 12 %
Lymphs Abs: 1.9 10*3/uL (ref 0.7–4.0)
MCH: 30.3 pg (ref 26.0–34.0)
MCHC: 33.7 g/dL (ref 30.0–36.0)
MCV: 90 fL (ref 78.0–100.0)
MONOS PCT: 6 %
Monocytes Absolute: 1 10*3/uL (ref 0.1–1.0)
NEUTROS ABS: 12.9 10*3/uL — AB (ref 1.7–7.7)
NEUTROS PCT: 82 %
Platelets: 170 10*3/uL (ref 150–400)
RBC: 4.12 MIL/uL (ref 3.87–5.11)
RDW: 13.4 % (ref 11.5–15.5)
WBC: 15.7 10*3/uL — AB (ref 4.0–10.5)

## 2014-11-08 LAB — URINE MICROSCOPIC-ADD ON

## 2014-11-08 LAB — URINALYSIS, ROUTINE W REFLEX MICROSCOPIC
BILIRUBIN URINE: NEGATIVE
Glucose, UA: NEGATIVE mg/dL
HGB URINE DIPSTICK: NEGATIVE
Leukocytes, UA: NEGATIVE
Nitrite: NEGATIVE
PH: 8.5 — AB (ref 5.0–8.0)
Protein, ur: 30 mg/dL — AB
SPECIFIC GRAVITY, URINE: 1.015 (ref 1.005–1.030)
Urobilinogen, UA: 0.2 mg/dL (ref 0.0–1.0)

## 2014-11-08 LAB — LIPASE, BLOOD: Lipase: 39 U/L (ref 11–51)

## 2014-11-08 LAB — PREGNANCY, URINE: PREG TEST UR: NEGATIVE

## 2014-11-08 MED ORDER — ALUM & MAG HYDROXIDE-SIMETH 200-200-20 MG/5ML PO SUSP
15.0000 mL | Freq: Once | ORAL | Status: AC
  Administered 2014-11-08: 15 mL via ORAL
  Filled 2014-11-08: qty 30

## 2014-11-08 MED ORDER — PANTOPRAZOLE SODIUM 40 MG IV SOLR
40.0000 mg | Freq: Once | INTRAVENOUS | Status: AC
  Administered 2014-11-08: 40 mg via INTRAVENOUS
  Filled 2014-11-08: qty 40

## 2014-11-08 MED ORDER — LIDOCAINE VISCOUS 2 % MT SOLN
15.0000 mL | Freq: Once | OROMUCOSAL | Status: AC
  Administered 2014-11-08: 15 mL via OROMUCOSAL
  Filled 2014-11-08: qty 15

## 2014-11-08 MED ORDER — SODIUM CHLORIDE 0.9 % IV BOLUS (SEPSIS)
1000.0000 mL | Freq: Once | INTRAVENOUS | Status: AC
  Administered 2014-11-08: 1000 mL via INTRAVENOUS

## 2014-11-08 MED ORDER — ONDANSETRON HCL 4 MG/2ML IJ SOLN: 4.0000 mg | Freq: Once | INTRAMUSCULAR | Status: DC

## 2014-11-08 MED ORDER — HALOPERIDOL LACTATE 5 MG/ML IJ SOLN
5.0000 mg | Freq: Once | INTRAMUSCULAR | Status: AC
  Administered 2014-11-08: 5 mg via INTRAVENOUS
  Filled 2014-11-08: qty 1

## 2014-11-08 MED ORDER — GI COCKTAIL ~~LOC~~
30.0000 mL | Freq: Once | ORAL | Status: AC
  Administered 2014-11-08: 30 mL via ORAL
  Filled 2014-11-08: qty 30

## 2014-11-08 MED ORDER — KETOROLAC TROMETHAMINE 30 MG/ML IJ SOLN
30.0000 mg | Freq: Once | INTRAMUSCULAR | Status: AC
  Administered 2014-11-08: 30 mg via INTRAVENOUS
  Filled 2014-11-08: qty 1

## 2014-11-08 MED ORDER — METOCLOPRAMIDE HCL 5 MG/ML IJ SOLN
10.0000 mg | Freq: Once | INTRAMUSCULAR | Status: AC
  Administered 2014-11-08: 10 mg via INTRAVENOUS
  Filled 2014-11-08: qty 2

## 2014-11-08 NOTE — ED Notes (Signed)
Patient also stated that she was nauseous and did not have RX at home for symptoms. Stated she might have 1 refill of zofran Rx. MD notified of statements and said she could call the pharmacy and check, and if not, she could call Dr Moshe Cipro tomorrow to refill. Patient advised of this. Patient left ED at this time with husband.

## 2014-11-08 NOTE — ED Notes (Signed)
MD notified of patient continued pain complaints.

## 2014-11-08 NOTE — ED Notes (Signed)
Discharge instructions reviewed with patient. Patient extremely reluctant to get dressed and be discharged. Patient signed for discharge. Patient removed from monitor. Fiance called back from waiting room per her request. Patient offered assistance and to speak with MD again prior to discharge to which she declined. Fiance at bedside.

## 2014-11-08 NOTE — Discharge Instructions (Signed)

## 2014-11-08 NOTE — ED Notes (Signed)
Pt reports onset of emesis this am with mid upper abdominal pain. Pt reports hx of gastroparesis.

## 2014-11-08 NOTE — ED Provider Notes (Signed)
CSN: [id number]     Arrival date & time 11/08/14  1254 History   First MD Initiated Contact with Patient 11/08/14 1433     Chief Complaint  Patient presents with  . Emesis     (Consider location/radiation/quality/duration/timing/severity/associated sxs/prior Treatment) Patient is a 34 y.o. female presenting with abdominal pain. The history is provided by the patient.  Abdominal Pain Pain location:  Epigastric Pain quality: sharp and shooting   Pain radiates to:  Does not radiate Pain severity:  Severe Onset quality:  Sudden Duration:  6 hours Timing:  Constant Progression:  Worsening Chronicity:  Chronic Relieved by:  Nothing Worsened by:  Nothing tried Ineffective treatments:  None tried Associated symptoms: nausea and vomiting   Associated symptoms: no chest pain, no chills, no dysuria, no fever and no shortness of breath    34 yo with a chief complaint of epigastric abdominal pain. The started this morning. Patient has a history of chronic abdominal pain that is thought to be related to gastroparesis. Patient sees pain management at [facility] for this. Patient has not been on narcotics because she has been found to be smoking marijuana. Patient states the pain started today is typical of her normal abdominal pain. She takes medicines at home but feels like they have not helped her. Denies fevers or chills. Denies diarrhea.   Past Medical History  Diagnosis Date  . Migraines   . Osteoporosis   . Seasonal allergies   . Sinusitis   . Back pain, chronic   . Nicotine addiction   . Constipation   . Vitamin D deficiency   . Gastritis   . Gastroparesis   . Pyloric spasm 03/30/2011  . Gastric outlet obstruction   . COPD (chronic obstructive pulmonary disease) ([place])   . Asthma   . Substance abuse 2008    marijuana  . Peptic ulcer disease   . Depression 2000    h/o suicidal ideation  . Anemia of other chronic disease 11/29/2012  . Chronic abdominal pain   . Nausea and  vomiting     recurrent   Past Surgical History  Procedure Laterality Date  . Cholecystectomy      ?2002  . Laser surgery on cervix    . Carpal tunnel release      left hand  . Esophagogastroduodenoscopy  12/25/2010    Dorothyann Peng, MD;  moderate gastritis, ?goo secondary to pylorspasm. BX showed reactive gstropathy no h.pyori, SB mucosa with intramucosal lymphocytosis and partial villous blunting (TTG 4.0 normal)  . Esophageal dilation  12/25/2010    Procedure: ESOPHAGEAL DILATION;  Surgeon: Dorothyann Peng, MD;  Location: [facility];  Service: Endoscopy;;  . Esophagogastroduodenoscopy N/A 11/14/2012    ZJQ:BHALP hiatal hernia. Abnormal gastric mucosa of  uncertain significance-status post biopsy. Subjectively, patient may have recurrent symptomatic, pylorospasm.  . Esophagogastroduodenoscopy (egd) with propofol N/A 02/09/2013    elongated stomach, partial lower esophageal ring widely patent. No obvious pyloric stenosis s/p Botox  . Botox injection N/A 02/09/2013    Procedure: BOTOX INJECTION;  Surgeon: Missy Sabins, MD;  Location: [facility];  Service: Endoscopy;  Laterality: N/A;  possible balloon  . Balloon dilation N/A 02/09/2013    Procedure: BALLOON DILATION;  Surgeon: Missy Sabins, MD;  Location: [facility];  Service: Endoscopy;  Laterality: N/A;  . Colonoscopy with propofol N/A 09/20/2014    RMR: Normal ileo-colonoscopy   Family History  Problem Relation Age of Onset  . Diabetes Father   .  Liver disease Father     liver transplant at [facility], age 28  . Colon cancer Neg Hx   . Lung cancer Mother 64  . Heart disease Maternal Grandmother   . Parkinson's disease Maternal Grandfather   . Multiple sclerosis Sister 42  . Depression Sister 16    bipolar and schizophrenic  . Alcohol abuse Sister    Social History  Substance Use Topics  . Smoking status: Current Every Day Smoker -- 0.10 packs/day for 15 years    Types: Cigarettes  . Smokeless tobacco: Never Used     Comment:  smokes two  cigarette daily  . Alcohol Use: No     Comment: none since July 2016. Marland Kitchen Periodic heavy drinker for months at a time.   OB History    No data available     Review of Systems  Constitutional: Negative for fever and chills.  HENT: Negative for congestion and rhinorrhea.   Eyes: Negative for redness and visual disturbance.  Respiratory: Negative for shortness of breath and wheezing.   Cardiovascular: Negative for chest pain and palpitations.  Gastrointestinal: Positive for nausea, vomiting and abdominal pain.  Genitourinary: Negative for dysuria and urgency.  Musculoskeletal: Negative for myalgias and arthralgias.  Skin: Negative for pallor and wound.  Neurological: Negative for dizziness and headaches.      Allergies  Benadryl; Latex; and Penicillins  Home Medications   Prior to Admission medications   Medication Sig Start Date End Date Taking? Authorizing Provider  albuterol (PROVENTIL HFA;VENTOLIN HFA) 108 (90 BASE) MCG/ACT inhaler Inhale 2 puffs into the lungs every 6 (six) hours as needed. Shortness of breath Patient taking differently: Inhale 2 puffs into the lungs every 6 (six) hours as needed for wheezing or shortness of breath. Shortness of breath 11/14/12 05/27/15 Yes [name], MD  DULoxetine (CYMBALTA) 30 MG capsule Take 1 capsule (30 mg total) by mouth daily. 05/16/14  Yes [name], MD  ergocalciferol (VITAMIN D2) 50000 UNITS capsule Take 1 capsule (50,000 Units total) by mouth once a week. One capsule once weekly 05/16/14  Yes [name], MD  gabapentin (NEURONTIN) 100 MG capsule Take 100 mg by mouth 3 (three) times daily.   Yes Historical Provider, MD  Linaclotide (LINZESS) 290 MCG CAPS capsule Take 1 capsule (290 mcg total) by mouth daily. 08/27/14  Yes Mahala Menghini, PA-C  NUCYNTA 50 MG TABS tablet Take 50 mg by mouth every 8 (eight) hours as needed for moderate pain.  08/27/14  Yes Historical Provider, MD  ondansetron (ZOFRAN) 4 MG  tablet TAKE 1 TABLET(4 MG) BY MOUTH EVERY 6 HOURS AS NEEDED FOR NAUSEA OR VOMITING 09/26/14  Yes Carlis Stable, NP  pantoprazole (PROTONIX) 40 MG tablet Take 1 tablet (40 mg total) by mouth 2 (two) times daily before a meal. 10/19/13  Yes Mahala Menghini, PA-C  sucralfate (CARAFATE) 1 G tablet Crush one tablet and mix in applesauce, take every morning and at bedtime as needed for stomach burning. 08/27/14  Yes Mahala Menghini, PA-C  tiZANidine (ZANAFLEX) 4 MG tablet Take 4 mg by mouth every 6 (six) hours as needed for muscle spasms.  10/27/13  Yes Historical Provider, MD  budesonide-formoterol (SYMBICORT) 80-4.5 MCG/ACT inhaler Inhale 2 puffs into the lungs 2 (two) times daily. Patient taking differently: Inhale 2 puffs into the lungs every evening.  11/14/12 05/27/15  [name], MD  calcium-vitamin D (OSCAL 500/200 D-3) 500-200 MG-UNIT per tablet Take 1 tablet by mouth 2 (  two) times daily. 11/14/12 11/08/14  [name], MD  calcium-vitamin D (OSCAL WITH D) 500-200 MG-UNIT per tablet Take 1 tablet by mouth 2 (two) times daily.    Historical Provider, MD  clobetasol cream (TEMOVATE) 1.44 % Apply 1 application topically 2 (two) times daily as needed (rash). Apply to rash    Historical Provider, MD  estradiol (ESTRACE VAGINAL) 0.1 MG/GM vaginal cream Place 2 g vaginally at bedtime. As directed    Historical Provider, MD  feeding supplement (ENSURE CLINICAL STRENGTH) LIQD Take 237 mLs by mouth 3 (three) times daily with meals. Patient taking differently: Take 237 mLs by mouth 2 (two) times daily.  01/01/11   Nishant Dhungel, MD  ferrous sulfate 325 (65 FE) MG tablet Take 325 mg by mouth daily with breakfast.    Historical Provider, MD  loratadine (CLARITIN) 10 MG tablet Take 1 tablet (10 mg total) by mouth daily. 10/09/11   [name], MD  mometasone (NASONEX) 50 MCG/ACT nasal spray Place 2 sprays into the nose 2 (two) times daily. 05/16/14   [name], MD  polyethylene  glycol-electrolytes (NULYTELY/GOLYTELY) 420 G solution Take 4,000 mLs by mouth once. 08/27/14   Mahala Menghini, PA-C  promethazine (PROMETHEGAN) 25 MG suppository Place 1 suppository (25 mg total) rectally every 6 (six) hours as needed for nausea or vomiting. Patient not taking: Reported on 11/08/2014 07/28/14   Ernestina Patches, MD  simvastatin (ZOCOR) 20 MG tablet Take 20 mg by mouth daily.    Historical Provider, MD   BP 117/69 mmHg  Pulse 89  Temp(Src)   Resp 21  Ht 5\' 6"  (1.676 m)  Wt 142 lb (64.411 kg)  BMI 22.93 kg/m2  SpO2 100%  LMP 01/19/1997 Physical Exam  Constitutional: She is oriented to person, place, and time. She appears well-developed and well-nourished. No distress.  HENT:  Head: Normocephalic and atraumatic.  Eyes: EOM are normal. Pupils are equal, round, and reactive to light.  Neck: Normal range of motion. Neck supple.  Cardiovascular: Normal rate and regular rhythm.  Exam reveals no gallop and no friction rub.   No murmur heard. Pulmonary/Chest: Effort normal. She has no wheezes. She has no rales.  Abdominal: Soft. She exhibits no distension. There is tenderness (worst in the epigatrum). There is no rebound and no guarding.  Musculoskeletal: She exhibits no edema or tenderness.  Neurological: She is alert and oriented to person, place, and time.  Skin: Skin is warm and dry. She is not diaphoretic.  Psychiatric: She has a normal mood and affect. Her behavior is normal.  Nursing note and vitals reviewed.   ED Course  Procedures (including critical care time) Labs Review Labs Reviewed  CBC WITH DIFFERENTIAL/PLATELET - Abnormal; Notable for the following:    WBC 15.7 (*)    Neutro Abs 12.9 (*)    All other components within normal limits  COMPREHENSIVE METABOLIC PANEL - Abnormal; Notable for the following:    Glucose, Bld 141 (*)    BUN 23 (*)    Total Protein 8.2 (*)    All other components within normal limits  URINALYSIS, ROUTINE W REFLEX MICROSCOPIC (NOT AT  [facility]) - Abnormal; Notable for the following:    pH 8.5 (*)    Ketones, ur TRACE (*)    Protein, ur 30 (*)    All other components within normal limits  URINE MICROSCOPIC-ADD ON - Abnormal; Notable for the following:    Squamous Epithelial / LPF FEW (*)  Bacteria, UA FEW (*)    All other components within normal limits  LIPASE, BLOOD  PREGNANCY, URINE    Imaging Review No results found. I have personally reviewed and evaluated these images and lab results as part of my medical decision-making.   EKG Interpretation   Date/Time:  Thursday November 08 2014 16:40:14 EDT Ventricular Rate:  91 PR Interval:  172 QRS Duration: 95 QT Interval:  382 QTC Calculation: 470 R Axis:   83 Text Interpretation:  Sinus rhythm Abnormal T, consider ischemia, diffuse  leads Baseline wander in lead(s) V4 No significant change since last  tracing Confirmed by Rubina Basinski MD, Quillian Quince ([id number]) on 11/08/2014 5:22:35 PM      MDM   Final diagnoses:  Abdominal pain, epigastric    34 yo F with chronic abdominal pain he comes in with the same. Typical of her chronic abdominal pain we'll give her Reglan patient has a listed anaphylactic allergy to Benadryl though she has no documentation of the adverse reaction. Patient has gotten Benadryl IM in the past with no reaction.  Obtain CBC CMP lipase. UA urine pregnancy.  Laboratory evaluation unremarkable. EKG unchanged. Patient appears to be much more comfortable after Reglan and Haldol. EKG was performed QTC less than 500. Patient is sleeping but whenever you wake her up says her pain is 11 out of 10. Able to tolerate PO. Discussed with patient that I will not be giving her narcotics for this. Patient requesting discharge home.  6:48 PM:  I have discussed the diagnosis/risks/treatment options with the patient and family and believe the pt to be eligible for discharge home to follow-up with Pain mgmt, GI, PCP. We also discussed returning to the ED immediately if new  or worsening sx occur. We discussed the sx which are most concerning (e.g., sudden worsening pain, fever, inability to tolerate by mouth) that necessitate immediate return. Medications administered to the patient during their visit and any new prescriptions provided to the patient are listed below.  Medications given during this visit Medications  sodium chloride 0.9 % bolus 1,000 mL (0 mLs Intravenous Stopped 11/08/14 1733)  metoCLOPramide (REGLAN) injection 10 mg (10 mg Intravenous Given 11/08/14 1514)  ketorolac (TORADOL) 30 MG/ML injection 30 mg (30 mg Intravenous Given 11/08/14 1514)  pantoprazole (PROTONIX) injection 40 mg (40 mg Intravenous Given 11/08/14 1514)  alum & mag hydroxide-simeth (MAALOX/MYLANTA) 200-200-20 MG/5ML suspension 15 mL (15 mLs Oral Given 11/08/14 1517)  lidocaine (XYLOCAINE) 2 % viscous mouth solution 15 mL (15 mLs Mouth/Throat Given 11/08/14 1517)  haloperidol lactate (HALDOL) injection 5 mg (5 mg Intravenous Given 11/08/14 1652)  gi cocktail (Maalox,Lidocaine,Donnatal) (30 mLs Oral Given 11/08/14 1744)  sodium chloride 0.9 % bolus 1,000 mL (0 mLs Intravenous Stopped 11/08/14 1830)    New Prescriptions   No medications on file    The patient appears reasonably screen and/or stabilized for discharge and I doubt any other medical condition or other EMC requiring further screening, evaluation, or treatment in the ED at this time prior to discharge.    Deno Etienne, DO 11/08/14 [phone]

## 2014-11-10 ENCOUNTER — Encounter (HOSPITAL_COMMUNITY): Payer: Self-pay | Admitting: Oncology

## 2014-11-10 ENCOUNTER — Emergency Department (HOSPITAL_COMMUNITY)
Admission: EM | Admit: 2014-11-10 | Discharge: 2014-11-10 | Disposition: A | Discharge status: Home / Self Care | Payer: Medicare Other | Attending: Emergency Medicine | Admitting: Emergency Medicine

## 2014-11-10 DIAGNOSIS — D649 Anemia, unspecified: Secondary | ICD-10-CM | POA: Insufficient documentation

## 2014-11-10 DIAGNOSIS — F329 Major depressive disorder, single episode, unspecified: Secondary | ICD-10-CM | POA: Insufficient documentation

## 2014-11-10 DIAGNOSIS — E559 Vitamin D deficiency, unspecified: Secondary | ICD-10-CM | POA: Diagnosis not present

## 2014-11-10 DIAGNOSIS — G43909 Migraine, unspecified, not intractable, without status migrainosus: Secondary | ICD-10-CM | POA: Insufficient documentation

## 2014-11-10 DIAGNOSIS — Z8711 Personal history of peptic ulcer disease: Secondary | ICD-10-CM | POA: Insufficient documentation

## 2014-11-10 DIAGNOSIS — G8929 Other chronic pain: Secondary | ICD-10-CM | POA: Insufficient documentation

## 2014-11-10 DIAGNOSIS — Z880 Allergy status to penicillin: Secondary | ICD-10-CM | POA: Insufficient documentation

## 2014-11-10 DIAGNOSIS — Z91040 Latex allergy status: Secondary | ICD-10-CM | POA: Insufficient documentation

## 2014-11-10 DIAGNOSIS — R1013 Epigastric pain: Secondary | ICD-10-CM | POA: Diagnosis present

## 2014-11-10 DIAGNOSIS — R1115 Cyclical vomiting syndrome unrelated to migraine: Secondary | ICD-10-CM

## 2014-11-10 DIAGNOSIS — Z720 Tobacco use: Secondary | ICD-10-CM | POA: Insufficient documentation

## 2014-11-10 DIAGNOSIS — R110 Nausea: Secondary | ICD-10-CM | POA: Diagnosis not present

## 2014-11-10 DIAGNOSIS — J449 Chronic obstructive pulmonary disease, unspecified: Secondary | ICD-10-CM | POA: Diagnosis not present

## 2014-11-10 DIAGNOSIS — Z8719 Personal history of other diseases of the digestive system: Secondary | ICD-10-CM | POA: Insufficient documentation

## 2014-11-10 DIAGNOSIS — Z9049 Acquired absence of other specified parts of digestive tract: Secondary | ICD-10-CM | POA: Diagnosis not present

## 2014-11-10 DIAGNOSIS — G43A0 Cyclical vomiting, not intractable: Secondary | ICD-10-CM | POA: Diagnosis not present

## 2014-11-10 DIAGNOSIS — M810 Age-related osteoporosis without current pathological fracture: Secondary | ICD-10-CM | POA: Diagnosis not present

## 2014-11-10 DIAGNOSIS — Z79899 Other long term (current) drug therapy: Secondary | ICD-10-CM | POA: Diagnosis not present

## 2014-11-10 DIAGNOSIS — Z7951 Long term (current) use of inhaled steroids: Secondary | ICD-10-CM | POA: Insufficient documentation

## 2014-11-10 LAB — COMPREHENSIVE METABOLIC PANEL
ALBUMIN: 4.8 g/dL (ref 3.5–5.0)
ALK PHOS: 68 U/L (ref 38–126)
ALT: 21 U/L (ref 14–54)
ANION GAP: 10 (ref 5–15)
AST: 27 U/L (ref 15–41)
BILIRUBIN TOTAL: 0.7 mg/dL (ref 0.3–1.2)
BUN: 20 mg/dL (ref 6–20)
CALCIUM: 9.9 mg/dL (ref 8.9–10.3)
CO2: 28 mmol/L (ref 22–32)
Chloride: 99 mmol/L — ABNORMAL LOW (ref 101–111)
Creatinine, Ser: 0.76 mg/dL (ref 0.44–1.00)
GFR calc Af Amer: >60 mL/min (ref >60)
GFR calc non Af Amer: >60 mL/min (ref >60)
GLUCOSE: 113 mg/dL — AB (ref 65–99)
Potassium: 3.5 mmol/L (ref 3.5–5.1)
Sodium: 137 mmol/L (ref 135–145)
TOTAL PROTEIN: 8.3 g/dL — AB (ref 6.5–8.1)

## 2014-11-10 LAB — DIFFERENTIAL
BASOS ABS: 0 10*3/uL (ref 0.0–0.1)
BASOS PCT: 0 %
EOS ABS: 0 10*3/uL (ref 0.0–0.7)
Eosinophils Relative: 0 %
Lymphocytes Relative: 14 %
Lymphs Abs: 1.6 10*3/uL (ref 0.7–4.0)
Monocytes Absolute: 1.1 10*3/uL — ABNORMAL HIGH (ref 0.1–1.0)
Monocytes Relative: 10 %
NEUTROS PCT: 76 %
Neutro Abs: 8.7 10*3/uL — ABNORMAL HIGH (ref 1.7–7.7)

## 2014-11-10 LAB — CBC
HCT: 36.8 % (ref 36.0–46.0)
Hemoglobin: 12.1 g/dL (ref 12.0–15.0)
MCH: 30 pg (ref 26.0–34.0)
MCHC: 32.9 g/dL (ref 30.0–36.0)
MCV: 91.1 fL (ref 78.0–100.0)
Platelets: 161 10*3/uL (ref 150–400)
RBC: 4.04 MIL/uL (ref 3.87–5.11)
RDW: 13.6 % (ref 11.5–15.5)
WBC: 11.4 10*3/uL — ABNORMAL HIGH (ref 4.0–10.5)

## 2014-11-10 LAB — LIPASE, BLOOD: Lipase: 30 U/L (ref 11–51)

## 2014-11-10 MED ORDER — ONDANSETRON 4 MG PO TBDP: 4.0000 mg | ORAL_TABLET | ORAL | Status: DC | PRN

## 2014-11-10 MED ORDER — ONDANSETRON HCL 4 MG/2ML IJ SOLN
4.0000 mg | Freq: Once | INTRAMUSCULAR | Status: AC | PRN
  Administered 2014-11-10: 4 mg via INTRAVENOUS
  Filled 2014-11-10: qty 2

## 2014-11-10 MED ORDER — SODIUM CHLORIDE 0.9 % IV SOLN
INTRAVENOUS | Status: DC
  Administered 2014-11-10: 08:00:00 via INTRAVENOUS

## 2014-11-10 MED ORDER — GI COCKTAIL ~~LOC~~
30.0000 mL | Freq: Once | ORAL | Status: AC
  Administered 2014-11-10: 30 mL via ORAL
  Filled 2014-11-10 (×2): qty 30

## 2014-11-10 MED ORDER — SODIUM CHLORIDE 0.9 % IV SOLN
1000.0000 mL | Freq: Once | INTRAVENOUS | Status: AC
  Administered 2014-11-10: 1000 mL via INTRAVENOUS

## 2014-11-10 MED ORDER — HALOPERIDOL LACTATE 5 MG/ML IJ SOLN
5.0000 mg | Freq: Once | INTRAMUSCULAR | Status: AC
  Administered 2014-11-10: 5 mg via INTRAVENOUS
  Filled 2014-11-10: qty 1

## 2014-11-10 MED ORDER — ONDANSETRON HCL 4 MG PO TABS: 4.0000 mg | ORAL_TABLET | Freq: Four times a day (QID) | ORAL | Status: DC

## 2014-11-10 MED ORDER — PROMETHAZINE HCL 25 MG RE SUPP: 25.0000 mg | Freq: Four times a day (QID) | RECTAL | Status: DC | PRN

## 2014-11-10 MED ORDER — PANTOPRAZOLE SODIUM 40 MG IV SOLR
40.0000 mg | Freq: Once | INTRAVENOUS | Status: AC
  Administered 2014-11-10: 40 mg via INTRAVENOUS
  Filled 2014-11-10: qty 40

## 2014-11-10 NOTE — Discharge Instructions (Signed)
Abdominal Pain, Adult °Many things can cause abdominal pain. Usually, abdominal pain is not caused by a disease and will improve without treatment. It can often be observed and treated at home. Your health care provider will do a physical exam and possibly order blood tests and X-rays to help determine the seriousness of your pain. However, in many cases, more time must pass before a clear cause of the pain can be found. Before that point, your health care provider may not know if you need more testing or further treatment. °HOME CARE INSTRUCTIONS °Monitor your abdominal pain for any changes. The following actions may help to alleviate any discomfort you are experiencing: °· Only take over-the-counter or prescription medicines as directed by your health care provider. °· Do not take laxatives unless directed to do so by your health care provider. °· Try a clear liquid diet (broth, tea, or water) as directed by your health care provider. Slowly move to a bland diet as tolerated. °SEEK MEDICAL CARE IF: °· You have unexplained abdominal pain. °· You have abdominal pain associated with nausea or diarrhea. °· You have pain when you urinate or have a bowel movement. °· You experience abdominal pain that wakes you in the night. °· You have abdominal pain that is worsened or improved by eating food. °· You have abdominal pain that is worsened with eating fatty foods. °· You have a fever. °SEEK IMMEDIATE MEDICAL CARE IF: °· Your pain does not go away within 2 hours. °· You keep throwing up (vomiting). °· Your pain is felt only in portions of the abdomen, such as the right side or the left lower portion of the abdomen. °· You pass bloody or black tarry stools. °MAKE SURE YOU: °· Understand these instructions. °· Will watch your condition. °· Will get help right away if you are not doing well or get worse. °  °This information is not intended to replace advice given to you by your health care provider. Make sure you discuss  any questions you have with your health care provider. °  °Document Released: 10/15/2004 Document Revised: 09/26/2014 Document Reviewed: 09/14/2012 °Elsevier Interactive Patient Education ©2016 [employer] ° °Nausea and Vomiting °Nausea is a sick feeling that often comes before throwing up (vomiting). Vomiting is a reflex where stomach contents come out of your mouth. Vomiting can cause severe loss of body fluids (dehydration). Children and elderly adults can become dehydrated quickly, especially if they also have diarrhea. Nausea and vomiting are symptoms of a condition or disease. It is important to find the cause of your symptoms. °CAUSES  °· Direct irritation of the stomach lining. This irritation can result from increased acid production (gastroesophageal reflux disease), infection, food poisoning, taking certain medicines (such as nonsteroidal anti-inflammatory drugs), alcohol use, or tobacco use. °· Signals from the brain. These signals could be caused by a headache, heat exposure, an inner ear disturbance, increased pressure in the brain from injury, infection, a tumor, or a concussion, pain, emotional stimulus, or metabolic problems. °· An obstruction in the gastrointestinal tract (bowel obstruction). °· Illnesses such as diabetes, hepatitis, gallbladder problems, appendicitis, kidney problems, cancer, sepsis, atypical symptoms of a heart attack, or eating disorders. °· Medical treatments such as chemotherapy and radiation. °· Receiving medicine that makes you sleep (general anesthetic) during surgery. °DIAGNOSIS °Your caregiver may ask for tests to be done if the problems do not improve after a few days. Tests may also be done if symptoms are severe or if the reason for the   nausea and vomiting is not clear. Tests may include: °· Urine tests. °· Blood tests. °· Stool tests. °· Cultures (to look for evidence of infection). °· X-rays or other imaging studies. °Test results can help your caregiver make  decisions about treatment or the need for additional tests. °TREATMENT °You need to stay well hydrated. Drink frequently but in small amounts. You may wish to drink water, sports drinks, clear broth, or eat frozen ice pops or gelatin dessert to help stay hydrated. When you eat, eating slowly may help prevent nausea. There are also some antinausea medicines that may help prevent nausea. °HOME CARE INSTRUCTIONS  °· Take all medicine as directed by your caregiver. °· If you do not have an appetite, do not force yourself to eat. However, you must continue to drink fluids. °· If you have an appetite, eat a normal diet unless your caregiver tells you differently. °¨ Eat a variety of complex carbohydrates (rice, wheat, potatoes, bread), lean meats, yogurt, fruits, and vegetables. °¨ Avoid high-fat foods because they are more difficult to digest. °· Drink enough water and fluids to keep your urine clear or pale yellow. °· If you are dehydrated, ask your caregiver for specific rehydration instructions. Signs of dehydration may include: °¨ Severe thirst. °¨ Dry lips and mouth. °¨ Dizziness. °¨ Dark urine. °¨ Decreasing urine frequency and amount. °¨ Confusion. °¨ Rapid breathing or pulse. °SEEK IMMEDIATE MEDICAL CARE IF:  °· You have blood or brown flecks (like coffee grounds) in your vomit. °· You have black or bloody stools. °· You have a severe headache or stiff neck. °· You are confused. °· You have severe abdominal pain. °· You have chest pain or trouble breathing. °· You do not urinate at least once every 8 hours. °· You develop cold or clammy skin. °· You continue to vomit for longer than 24 to 48 hours. °· You have a fever. °MAKE SURE YOU:  °· Understand these instructions. °· Will watch your condition. °· Will get help right away if you are not doing well or get worse. °  °This information is not intended to replace advice given to you by your health care provider. Make sure you discuss any questions you have with  your health care provider. °  °Document Released: 01/05/2005 Document Revised: 03/30/2011 Document Reviewed: 06/04/2010 °Elsevier Interactive Patient Education ©2016 [employer] ° °

## 2014-11-10 NOTE — ED Notes (Signed)
Pt presents d/t persistent abdominal pain, nausea and vomiting.  Per pt she cannot keep anything down.  Pain is rated 10/10, cramping and throbbing in nature.

## 2014-11-10 NOTE — ED Provider Notes (Signed)
CSN: [id number]     Arrival date & time 11/10/14  0622 History   First MD Initiated Contact with Patient 11/10/14 [phone]     Chief Complaint  Patient presents with  . Abdominal Pain     (Consider location/radiation/quality/duration/timing/severity/associated sxs/prior Treatment) HPI Patient presents with epigastric pain and vomiting. She reports she's vomited multiple times. No diarrhea. Pain is sharp and burning. This is been ongoing for a number days. Patient has history of chronic abdominal pain and vomiting. She was seen at [facility] 2 days ago and treated with hydration and antiemetics. She reports her symptoms persist. No fever. No pain burning or urgency. Urination. She reports her last GI appointment was in September. There was no change in her management at that time. Past Medical History  Diagnosis Date  . Migraines   . Osteoporosis   . Seasonal allergies   . Sinusitis   . Back pain, chronic   . Nicotine addiction   . Constipation   . Vitamin D deficiency   . Gastritis   . Gastroparesis   . Pyloric spasm 03/30/2011  . Gastric outlet obstruction   . COPD (chronic obstructive pulmonary disease) ([place])   . Asthma   . Substance abuse 2008    marijuana  . Peptic ulcer disease   . Depression 2000    h/o suicidal ideation  . Anemia of other chronic disease 11/29/2012  . Chronic abdominal pain   . Nausea and vomiting     recurrent   Past Surgical History  Procedure Laterality Date  . Cholecystectomy      ?2002  . Laser surgery on cervix    . Carpal tunnel release      left hand  . Esophagogastroduodenoscopy  12/25/2010    Dorothyann Peng, MD;  moderate gastritis, ?goo secondary to pylorspasm. BX showed reactive gstropathy no h.pyori, SB mucosa with intramucosal lymphocytosis and partial villous blunting (TTG 4.0 normal)  . Esophageal dilation  12/25/2010    Procedure: ESOPHAGEAL DILATION;  Surgeon: Dorothyann Peng, MD;  Location: [facility];  Service: Endoscopy;;  .  Esophagogastroduodenoscopy N/A 11/14/2012    HAL:PFXTK hiatal hernia. Abnormal gastric mucosa of  uncertain significance-status post biopsy. Subjectively, patient may have recurrent symptomatic, pylorospasm.  . Esophagogastroduodenoscopy (egd) with propofol N/A 02/09/2013    elongated stomach, partial lower esophageal ring widely patent. No obvious pyloric stenosis s/p Botox  . Botox injection N/A 02/09/2013    Procedure: BOTOX INJECTION;  Surgeon: Missy Sabins, MD;  Location: [facility];  Service: Endoscopy;  Laterality: N/A;  possible balloon  . Balloon dilation N/A 02/09/2013    Procedure: BALLOON DILATION;  Surgeon: Missy Sabins, MD;  Location: [facility] ENDOSCOPY;  Service: Endoscopy;  Laterality: N/A;  . Colonoscopy with propofol N/A 09/20/2014    RMR: Normal ileo-colonoscopy   Family History  Problem Relation Age of Onset  . Diabetes Father   . Liver disease Father     liver transplant at [facility], age 52  . Colon cancer Neg Hx   . Lung cancer Mother 66  . Heart disease Maternal Grandmother   . Parkinson's disease Maternal Grandfather   . Multiple sclerosis Sister 60  . Depression Sister 16    bipolar and schizophrenic  . Alcohol abuse Sister    Social History  Substance Use Topics  . Smoking status: Current Every Day Smoker -- 0.10 packs/day for 15 years    Types: Cigarettes  . Smokeless tobacco: Never Used     Comment:  smokes two  cigarette daily  . Alcohol Use: No     Comment: none since July 2016. Marland Kitchen Periodic heavy drinker for months at a time.   OB History    No data available     Review of Systems 10 Systems reviewed and are negative for acute change except as noted in the HPI.    Allergies  Benadryl; Latex; and Penicillins  Home Medications   Prior to Admission medications   Medication Sig Start Date End Date Taking? Authorizing Provider  albuterol (PROVENTIL HFA;VENTOLIN HFA) 108 (90 BASE) MCG/ACT inhaler Inhale 2 puffs into the lungs every 6 (six) hours as needed.  Shortness of breath Patient taking differently: Inhale 2 puffs into the lungs every 6 (six) hours as needed for wheezing or shortness of breath. Shortness of breath 11/14/12 05/27/15 Yes [name], MD  budesonide-formoterol [facility]) 80-4.5 MCG/ACT inhaler Inhale 2 puffs into the lungs 2 (two) times daily. Patient taking differently: Inhale 2 puffs into the lungs every evening.  11/14/12 05/27/15 Yes [name], MD  calcium-vitamin D (OSCAL WITH D) 500-200 MG-UNIT per tablet Take 1 tablet by mouth 2 (two) times daily.   Yes Historical Provider, MD  clobetasol cream (TEMOVATE) 2.39 % Apply 1 application topically 2 (two) times daily as needed (rash). Apply to rash   Yes Historical Provider, MD  DULoxetine (CYMBALTA) 30 MG capsule Take 1 capsule (30 mg total) by mouth daily. 05/16/14  Yes [name], MD  ergocalciferol (VITAMIN D2) 50000 UNITS capsule Take 1 capsule (50,000 Units total) by mouth once a week. One capsule once weekly 05/16/14  Yes [name], MD  estradiol (ESTRACE VAGINAL) 0.1 MG/GM vaginal cream Place 2 g vaginally at bedtime. As directed   Yes Historical Provider, MD  feeding supplement (ENSURE CLINICAL STRENGTH) LIQD Take 237 mLs by mouth 3 (three) times daily with meals. Patient taking differently: Take 237 mLs by mouth 2 (two) times daily.  01/01/11  Yes Nishant Dhungel, MD  ferrous sulfate 325 (65 FE) MG tablet Take 325 mg by mouth daily with breakfast.   Yes Historical Provider, MD  gabapentin (NEURONTIN) 100 MG capsule Take 100 mg by mouth 3 (three) times daily.   Yes Historical Provider, MD  loratadine (CLARITIN) 10 MG tablet Take 1 tablet (10 mg total) by mouth daily. 10/09/11  Yes [name], MD  mometasone (NASONEX) 50 MCG/ACT nasal spray Place 2 sprays into the nose 2 (two) times daily. 05/16/14  Yes [name], MD  NUCYNTA 50 MG TABS tablet Take 50 mg by mouth every 8 (eight) hours as needed for moderate pain.  08/27/14  Yes  Historical Provider, MD  ondansetron (ZOFRAN) 4 MG tablet TAKE 1 TABLET(4 MG) BY MOUTH EVERY 6 HOURS AS NEEDED FOR NAUSEA OR VOMITING 09/26/14  Yes Carlis Stable, NP  pantoprazole (PROTONIX) 40 MG tablet Take 1 tablet (40 mg total) by mouth 2 (two) times daily before a meal. 10/19/13  Yes Mahala Menghini, PA-C  simvastatin (ZOCOR) 20 MG tablet Take 20 mg by mouth daily.   Yes Historical Provider, MD  sucralfate (CARAFATE) 1 G tablet Crush one tablet and mix in applesauce, take every morning and at bedtime as needed for stomach burning. 08/27/14  Yes Mahala Menghini, PA-C  tiZANidine (ZANAFLEX) 4 MG tablet Take 4 mg by mouth every 6 (six) hours as needed for muscle spasms.  10/27/13  Yes Historical Provider, MD  calcium-vitamin D (OSCAL 500/200 D-3) 500-200 MG-UNIT per  tablet Take 1 tablet by mouth 2 (two) times daily. 11/14/12 11/08/14  [name], MD  Linaclotide (LINZESS) 290 MCG CAPS capsule Take 1 capsule (290 mcg total) by mouth daily. Patient not taking: Reported on 11/10/2014 08/27/14   Mahala Menghini, PA-C  ondansetron (ZOFRAN ODT) 4 MG disintegrating tablet Take 1 tablet (4 mg total) by mouth every 4 (four) hours as needed for nausea or vomiting. 11/10/14   Charlesetta Shanks, MD  ondansetron (ZOFRAN) 4 MG tablet Take 1 tablet (4 mg total) by mouth every 6 (six) hours. 11/10/14   Charlesetta Shanks, MD  polyethylene glycol-electrolytes (NULYTELY/GOLYTELY) 420 G solution Take 4,000 mLs by mouth once. 08/27/14   Mahala Menghini, PA-C  promethazine (PHENERGAN) 25 MG suppository Place 1 suppository (25 mg total) rectally every 6 (six) hours as needed for nausea or vomiting. 11/10/14   Charlesetta Shanks, MD  promethazine (PROMETHEGAN) 25 MG suppository Place 1 suppository (25 mg total) rectally every 6 (six) hours as needed for nausea or vomiting. Patient not taking: Reported on 11/08/2014 07/28/14   Ernestina Patches, MD   BP 118/67 mmHg  Pulse 68  Temp(Src) 98.2 F (36.8 C) (Oral)  Resp 17  Ht 5\' 6"  (1.676 m)   Wt 142 lb (64.411 kg)  BMI 22.93 kg/m2  SpO2 100%  LMP 01/19/1997 Physical Exam  Constitutional: She is oriented to person, place, and time. She appears well-developed and well-nourished.  HENT:  Head: Normocephalic and atraumatic.  Eyes: EOM are normal. Pupils are equal, round, and reactive to light.  Neck: Neck supple.  Cardiovascular: Normal rate, regular rhythm, normal heart sounds and intact distal pulses.   Pulmonary/Chest: Effort normal and breath sounds normal.  Abdominal: Soft. Bowel sounds are normal. She exhibits no distension. There is tenderness.  Patient's abdomen is soft and flat. She reports severe pain at palpation in the upper abdomen in the epigastrium. No guarding or rebound.  Musculoskeletal: Normal range of motion. She exhibits no edema or tenderness.  Neurological: She is alert and oriented to person, place, and time. She has normal strength. Coordination normal. GCS eye subscore is 4. GCS verbal subscore is 5. GCS motor subscore is 6.  Skin: Skin is warm, dry and intact.  Psychiatric: She has a normal mood and affect.    ED Course  Procedures (including critical care time) Labs Review Labs Reviewed  COMPREHENSIVE METABOLIC PANEL - Abnormal; Notable for the following:    Chloride 99 (*)    Glucose, Bld 113 (*)    Total Protein 8.3 (*)    All other components within normal limits  CBC - Abnormal; Notable for the following:    WBC 11.4 (*)    All other components within normal limits  DIFFERENTIAL - Abnormal; Notable for the following:    Neutro Abs 8.7 (*)    Monocytes Absolute 1.1 (*)    All other components within normal limits  LIPASE, BLOOD    Imaging Review No results found. I have personally reviewed and evaluated these images and lab results as part of my medical decision-making.   EKG Interpretation None      MDM   Final diagnoses:  Epigastric pain  Non-intractable cyclical vomiting with nausea   Patient presents with history of  chronic abdominal pain. She reported recurrent vomiting and epigastric pain. Patient was evaluated 2 days ago and treated with hydration and antiemetics. At this time her general appearance is well. Vital signs are stable and diagnostic studies do not indicate dehydration by  recent chemistry panel. He had urinalysis done 2 days ago that was negative. The patient's abdominal examination is nonsurgical. At this time she will be given antiemetics of oral Zofran and rectal Phenergan to use as needed. Patient is counseled to follow-up with her gastroenterologist for ongoing management of abdominal pain and what sounds consistent with cyclic vomiting syndrome. Patient does have a history of marijuana use. She is counseled on the possibility of cyclic vomiting and marijuana and encouraged to discontinue any use.    Charlesetta Shanks, MD 11/10/14 [phone]

## 2014-11-10 NOTE — ED Notes (Signed)
Pt unable to provide urine specimen at this time

## 2014-11-20 ENCOUNTER — Ambulatory Visit (INDEPENDENT_AMBULATORY_CARE_PROVIDER_SITE_OTHER): Payer: Medicare Other | Admitting: Gastroenterology

## 2014-11-20 ENCOUNTER — Encounter: Payer: Self-pay | Admitting: Gastroenterology

## 2014-11-20 VITALS — BP 104/60 | HR 71 | Temp 97.8°F | Ht 66.0 in | Wt 149.6 lb

## 2014-11-20 DIAGNOSIS — R112 Nausea with vomiting, unspecified: Secondary | ICD-10-CM | POA: Diagnosis not present

## 2014-11-20 DIAGNOSIS — K219 Gastro-esophageal reflux disease without esophagitis: Secondary | ICD-10-CM

## 2014-11-20 DIAGNOSIS — K5900 Constipation, unspecified: Secondary | ICD-10-CM

## 2014-11-20 MED ORDER — OMEPRAZOLE 40 MG PO CPDR: 40.0000 mg | DELAYED_RELEASE_CAPSULE | Freq: Two times a day (BID) | ORAL | Status: DC

## 2014-11-20 NOTE — Patient Instructions (Signed)
Stop pantoprazole and start omeprazole 40mg  before breakfast and 40mg  before evening meal.   Call in 2 weeks with progress report.   Gastroesophageal Reflux Disease, Adult Normally, food travels down the esophagus and stays in the stomach to be digested. However, when a person has gastroesophageal reflux disease (GERD), food and stomach acid move back up into the esophagus. When this happens, the esophagus becomes sore and inflamed. Over time, GERD can create small holes (ulcers) in the lining of the esophagus.  CAUSES This condition is caused by a problem with the muscle between the esophagus and the stomach (lower esophageal sphincter, or LES). Normally, the LES muscle closes after food passes through the esophagus to the stomach. When the LES is weakened or abnormal, it does not close properly, and that allows food and stomach acid to go back up into the esophagus. The LES can be weakened by certain dietary substances, medicines, and medical conditions, including:  Tobacco use.  Pregnancy.  Having a hiatal hernia.  Heavy alcohol use.  Certain foods and beverages, such as coffee, chocolate, onions, and peppermint. RISK FACTORS This condition is more likely to develop in:  People who have an increased body weight.  People who have connective tissue disorders.  People who use NSAID medicines. SYMPTOMS Symptoms of this condition include:  Heartburn.  Difficult or painful swallowing.  The feeling of having a lump in the throat.  Abitter taste in the mouth.  Bad breath.  Having a large amount of saliva.  Having an upset or bloated stomach.  Belching.  Chest pain.  Shortness of breath or wheezing.  Ongoing (chronic) cough or a night-time cough.  Wearing away of tooth enamel.  Weight loss. Different conditions can cause chest pain. Make sure to see your health care provider if you experience chest pain. DIAGNOSIS Your health care provider will take a medical  history and perform a physical exam. To determine if you have mild or severe GERD, your health care provider may also monitor how you respond to treatment. You may also have other tests, including:  An endoscopy toexamine your stomach and esophagus with a small camera.  A test thatmeasures the acidity level in your esophagus.  A test thatmeasures how much pressure is on your esophagus.  A barium swallow or modified barium swallow to show the shape, size, and functioning of your esophagus. TREATMENT The goal of treatment is to help relieve your symptoms and to prevent complications. Treatment for this condition may vary depending on how severe your symptoms are. Your health care provider may recommend:  Changes to your diet.  Medicine.  Surgery. HOME CARE INSTRUCTIONS Diet  Follow a diet as recommended by your health care provider. This may involve avoiding foods and drinks such as:  Coffee and tea (with or without caffeine).  Drinks that containalcohol.  Energy drinks and sports drinks.  Carbonated drinks or sodas.  Chocolate and cocoa.  Peppermint and mint flavorings.  Garlic and onions.  Horseradish.  Spicy and acidic foods, including peppers, chili powder, curry powder, vinegar, hot sauces, and barbecue sauce.  Citrus fruit juices and citrus fruits, such as oranges, lemons, and limes.  Tomato-based foods, such as red sauce, chili, salsa, and pizza with red sauce.  Fried and fatty foods, such as donuts, french fries, potato chips, and high-fat dressings.  High-fat meats, such as hot dogs and fatty cuts of red and white meats, such as rib eye steak, sausage, ham, and bacon.  High-fat dairy items, such  as whole milk, butter, and cream cheese.  Eat small, frequent meals instead of large meals.  Avoid drinking large amounts of liquid with your meals.  Avoid eating meals during the 2-3 hours before bedtime.  Avoid lying down right after you eat.  Do not  exercise right after you eat. General Instructions  Pay attention to any changes in your symptoms.  Take over-the-counter and prescription medicines only as told by your health care provider. Do not take aspirin, ibuprofen, or other NSAIDs unless your health care provider told you to do so.  Do not use any tobacco products, including cigarettes, chewing tobacco, and e-cigarettes. If you need help quitting, ask your health care provider.  Wear loose-fitting clothing. Do not wear anything tight around your waist that causes pressure on your abdomen.  Raise (elevate) the head of your bed 6 inches (15cm).  Try to reduce your stress, such as with yoga or meditation. If you need help reducing stress, ask your health care provider.  If you are overweight, reduce your weight to an amount that is healthy for you. Ask your health care provider for guidance about a safe weight loss goal.  Keep all follow-up visits as told by your health care provider. This is important. SEEK MEDICAL CARE IF:  You have new symptoms.  You have unexplained weight loss.  You have difficulty swallowing, or it hurts to swallow.  You have wheezing or a persistent cough.  Your symptoms do not improve with treatment.  You have a hoarse voice. SEEK IMMEDIATE MEDICAL CARE IF:  You have pain in your arms, neck, jaw, teeth, or back.  You feel sweaty, dizzy, or light-headed.  You have chest pain or shortness of breath.  You vomit and your vomit looks like blood or coffee grounds.  You faint.  Your stool is bloody or black.  You cannot swallow, drink, or eat.   This information is not intended to replace advice given to you by your health care provider. Make sure you discuss any questions you have with your health care provider.   Document Released: 10/15/2004 Document Revised: 09/26/2014 Document Reviewed: 05/02/2014 Elsevier Interactive Patient Education [employer].

## 2014-11-20 NOTE — Progress Notes (Signed)
Primary Care Physician: [name], MD  Primary Gastroenterologist:  Garfield Cornea, MD   Chief Complaint  Patient presents with  . Follow-up  . Nausea    HPI: Tracey Daniel is a 34 y.o. female here for f/u of nausea. Last seen in 08/2014. See detailed note from 08/27/14 for prior PMH. Since last OV, she had normal ileo-colonoscopy. Seen in ED twice recently. Had been doing well but developed acute onset vomiting after eating tuna salad sandwich for breakfast on 11/08/14. Labs unremarkable except for wbc 15.7 and then 11.4 on two ER visits. Still with nausea, no further vomiting. Nucynta started 08/2014 no longer on vicoden/oxycodone.    Still heartburn, couple of times per week. During day and at bedtime. Worse at night. Eating late and laying down. Taking both pantoprazole in AM cause often forgets nighttime dose but sometimes takes it at night as well (for total of 3 daily). Still on Linzess for constipation and doing well. No melena, brbpr.    Current Outpatient Prescriptions  Medication Sig Dispense Refill  . albuterol (PROVENTIL HFA;VENTOLIN HFA) 108 (90 BASE) MCG/ACT inhaler Inhale 2 puffs into the lungs every 6 (six) hours as needed. Shortness of breath (Patient taking differently: Inhale 2 puffs into the lungs every 6 (six) hours as needed for wheezing or shortness of breath. Shortness of breath) 6.7 g 4  . budesonide-formoterol (SYMBICORT) 80-4.5 MCG/ACT inhaler Inhale 2 puffs into the lungs 2 (two) times daily. (Patient taking differently: Inhale 2 puffs into the lungs every evening. ) 1 Inhaler 4  . calcium-vitamin D (OSCAL WITH D) 500-200 MG-UNIT per tablet Take 1 tablet by mouth 2 (two) times daily.    . clobetasol cream (TEMOVATE) 9.47 % Apply 1 application topically 2 (two) times daily as needed (rash). Apply to rash    . DULoxetine (CYMBALTA) 30 MG capsule Take 1 capsule (30 mg total) by mouth daily. 30 capsule 3  . ergocalciferol (VITAMIN D2) 50000 UNITS  capsule Take 1 capsule (50,000 Units total) by mouth once a week. One capsule once weekly 12 capsule 1  . estradiol (ESTRACE VAGINAL) 0.1 MG/GM vaginal cream Place 2 g vaginally at bedtime. As directed    . feeding supplement (ENSURE CLINICAL STRENGTH) LIQD Take 237 mLs by mouth 3 (three) times daily with meals. (Patient taking differently: Take 237 mLs by mouth 2 (two) times daily. ) 10 Bottle 3  . ferrous sulfate 325 (65 FE) MG tablet Take 325 mg by mouth daily with breakfast.    . gabapentin (NEURONTIN) 100 MG capsule Take 100 mg by mouth 3 (three) times daily.    . Linaclotide (LINZESS) 290 MCG CAPS capsule Take 1 capsule (290 mcg total) by mouth daily. 30 capsule 5  . loratadine (CLARITIN) 10 MG tablet Take 1 tablet (10 mg total) by mouth daily. 30 tablet 5  . mometasone (NASONEX) 50 MCG/ACT nasal spray Place 2 sprays into the nose 2 (two) times daily. 17 g 12  . NUCYNTA 50 MG TABS tablet Take 50 mg by mouth every 8 (eight) hours as needed for moderate pain.   0  . ondansetron (ZOFRAN) 4 MG tablet TAKE 1 TABLET(4 MG) BY MOUTH EVERY 6 HOURS AS NEEDED FOR NAUSEA OR VOMITING 30 tablet 1  . pantoprazole (PROTONIX) 40 MG tablet Take 1 tablet (40 mg total) by mouth 2 (two) times daily before a meal. 180 tablet 3  . simvastatin (ZOCOR) 20 MG tablet Take 20 mg by mouth daily.    Marland Kitchen  sucralfate (CARAFATE) 1 G tablet Crush one tablet and mix in applesauce, take every morning and at bedtime as needed for stomach burning. 60 tablet 3  . tiZANidine (ZANAFLEX) 4 MG tablet Take 4 mg by mouth every 6 (six) hours as needed for muscle spasms.     . calcium-vitamin D (OSCAL 500/200 D-3) 500-200 MG-UNIT per tablet Take 1 tablet by mouth 2 (two) times daily. 180 tablet 3   No current facility-administered medications for this visit.    Allergies as of 11/20/2014 - Review Complete 11/20/2014  Allergen Reaction Noted  . Benadryl [diphenhydramine] Anaphylaxis 03/25/2014  . Latex Itching and Other (See Comments)  11/30/2011  . Penicillins     Past Medical History  Diagnosis Date  . Migraines   . Osteoporosis   . Seasonal allergies   . Sinusitis   . Back pain, chronic   . Nicotine addiction   . Constipation   . Vitamin D deficiency   . Gastritis   . Gastroparesis   . Pyloric spasm 03/30/2011  . Gastric outlet obstruction   . COPD (chronic obstructive pulmonary disease) ([place])   . Asthma   . Substance abuse 2008    marijuana  . Peptic ulcer disease   . Depression 2000    h/o suicidal ideation  . Anemia of other chronic disease 11/29/2012  . Chronic abdominal pain   . Nausea and vomiting     recurrent   Past Surgical History  Procedure Laterality Date  . Cholecystectomy      ?2002  . Laser surgery on cervix    . Carpal tunnel release      left hand  . Esophagogastroduodenoscopy  12/25/2010    Dorothyann Peng, MD;  moderate gastritis, ?goo secondary to pylorspasm. BX showed reactive gstropathy no h.pyori, SB mucosa with intramucosal lymphocytosis and partial villous blunting (TTG 4.0 normal)  . Esophageal dilation  12/25/2010    Procedure: ESOPHAGEAL DILATION;  Surgeon: Dorothyann Peng, MD;  [facility];  Service: Endoscopy;;  . Esophagogastroduodenoscopy N/A 11/14/2012    [id number] hiatal hernia. Abnormal gastric mucosa of  uncertain significance-status post biopsy. Subjectively, patient may have recurrent symptomatic, pylorospasm.  . Esophagogastroduodenoscopy (egd) with propofol N/A 02/09/2013    elongated stomach, partial lower esophageal ring widely patent. No obvious pyloric stenosis s/p Botox  . Botox injection N/A 02/09/2013    Procedure: BOTOX INJECTION;  Surgeon: Missy Sabins, MD;  Location: [facility];  Service: Endoscopy;  Laterality: N/A;  possible balloon  . Balloon dilation N/A 02/09/2013    Procedure: BALLOON DILATION;  Surgeon: Missy Sabins, MD;  Location: [facility];  Service: Endoscopy;  Laterality: N/A;  . Colonoscopy with propofol N/A 09/20/2014    RMR:  Normal ileo-colonoscopy   Family History  Problem Relation Age of Onset  . Diabetes Father   . Liver disease Father     liver transplant at [facility], age 87  . Colon cancer Neg Hx   . Lung cancer Mother 69  . Heart disease Maternal Grandmother   . Parkinson's disease Maternal Grandfather   . Multiple sclerosis Sister 48  . Depression Sister 16    bipolar and schizophrenic  . Alcohol abuse Sister    Social History   Social History  . Marital Status: Single    Spouse Name: N/A  . Number of Children: 0  . Years of Education: N/A   Occupational History  . disability    Social History Main Topics  . Smoking status:  Current Every Day Smoker -- 0.10 packs/day for 15 years    Types: Cigarettes  . Smokeless tobacco: Never Used     Comment: smokes two  cigarette daily  . Alcohol Use: No     Comment: none since July 2016. Marland Kitchen Periodic heavy drinker for months at a time.  . Drug Use: No  . Sexual Activity: Yes    Birth Control/ Protection: None, Post-menopausal   Other Topics Concern  . None   Social History Narrative     ROS:  General: Negative for anorexia, weight loss, fever, chills, fatigue, weakness. ENT: Negative for hoarseness, difficulty swallowing , nasal congestion. CV: Negative for chest pain, angina, palpitations, dyspnea on exertion, peripheral edema.  Respiratory: Negative for dyspnea at rest, dyspnea on exertion, cough, sputum, wheezing.  GI: See history of present illness. GU:  Negative for dysuria, hematuria, urinary incontinence, urinary frequency, nocturnal urination.  Endo: Negative for unusual weight change.    Physical Examination:   BP 104/60 mmHg  Pulse 71  Temp(Src) 97.8 F (36.6 C) (Oral)  Ht 5\' 6"  (1.676 m)  Wt 149 lb 9.6 oz (67.858 kg)  BMI 24.16 kg/m2  LMP 01/19/1997  General: Well-nourished, well-developed in no acute distress.  Eyes: No icterus. Mouth: Oropharyngeal mucosa moist and pink , no lesions erythema or exudate. Lungs: Clear to  auscultation bilaterally.  Heart: Regular rate and rhythm, no murmurs rubs or gallops.  Abdomen: Bowel sounds are normal, nontender, nondistended, no hepatosplenomegaly or masses, no abdominal bruits or hernia , no rebound or guarding.   Extremities: No lower extremity edema. No clubbing or deformities. Neuro: Alert and oriented x 4   Skin: Warm and dry, no jaundice.   Psych: Alert and cooperative, normal mood and affect.  Labs:  Lab Results  Component Value Date   LIPASE 30 11/10/2014   Lab Results  Component Value Date   CREATININE 0.76 11/10/2014   BUN 20 11/10/2014   NA 137 11/10/2014   K 3.5 11/10/2014   CL 99* 11/10/2014   CO2 28 11/10/2014   Lab Results  Component Value Date   ALT 21 11/10/2014   AST 27 11/10/2014   ALKPHOS 68 11/10/2014   BILITOT 0.7 11/10/2014   Lab Results  Component Value Date   WBC 11.4* 11/10/2014   HGB 12.1 11/10/2014   HCT 36.8 11/10/2014   MCV 91.1 11/10/2014   PLT 161 11/10/2014    Imaging Studies: No results found.

## 2014-11-25 NOTE — Assessment & Plan Note (Signed)
Recent recurrent vomiting in setting of refractory GERD, h/o pylorospasm causing GOO requiring Botox. Vomiting subsided but ongoing nausea. Trial of change in PPI. Omeprazole 40mg  BID. PR in 2 weeks.

## 2014-11-25 NOTE — Assessment & Plan Note (Signed)
Doing well on Linzess. 

## 2014-11-26 NOTE — Progress Notes (Signed)
CC'D TO PCP °

## 2014-11-29 ENCOUNTER — Telehealth: Payer: Self-pay | Admitting: Internal Medicine

## 2014-11-29 NOTE — Telephone Encounter (Signed)
[phone]  PATIENT CALLED AND STATED THAT HER MEDICATION WAS NOT WORKING AND SHE IS STILL HAVING STOMACH PAIN,  SHE HAS ALSO HAD VOMITING.  PLEASE ADVISE

## 2014-11-29 NOTE — Telephone Encounter (Signed)
Pt was changed from omeprazole to pantoprazole and told to call in 2 weeks with progress report.

## 2014-11-30 ENCOUNTER — Other Ambulatory Visit: Payer: Self-pay

## 2014-11-30 MED ORDER — ONDANSETRON HCL 4 MG PO TABS: ORAL_TABLET | ORAL | Status: DC

## 2014-11-30 MED ORDER — DEXLANSOPRAZOLE 60 MG PO CPDR: 60.0000 mg | DELAYED_RELEASE_CAPSULE | Freq: Every day | ORAL | Status: DC

## 2014-12-03 ENCOUNTER — Other Ambulatory Visit (HOSPITAL_COMMUNITY): Payer: Medicare Other

## 2014-12-03 NOTE — Telephone Encounter (Signed)
Last EGD with Botox 01/2013 by Dr. Amedeo Plenty. To discuss with Dr. Gala Romney.  In meantime, soft bland diet only. If vomiting, then clear liquids only. Antireflux measures.

## 2014-12-04 ENCOUNTER — Other Ambulatory Visit (HOSPITAL_COMMUNITY): Payer: Medicare Other

## 2014-12-04 NOTE — Assessment & Plan Note (Addendum)
Smoking cessation education provided.    She does not meet lung screening criteria due to her young age.  She reports that she only smoke 2 cigarettes per day, but she has a strong odor of tobacco smoke during the office visit.

## 2014-12-04 NOTE — Progress Notes (Signed)
Tracey Nakayama, MD [address] [place] [address]  Anemia, unspecified anemia type  Cigarette nicotine dependence with other nicotine-induced disorder  Hyperproteinemia - Plan: Multiple myeloma panel, serum, Kappa/lambda light chains  CURRENT THERAPY: ferrous sulfate daily.  INTERVAL HISTORY: Tracey Daniel 34 y.o. female returns for followup of intermittent anemia, last noted in Feb 2016, on oral replacement.  I personally reviewed and went over laboratory results with the patient.  The results are noted within this dictation.  Labs will be updated today.  Labs are stable and reviewed during the office visit today.  Chart reviewed.  I have reviewed Dr. Griffin Dakin last note: Nicotine dependence Patient counseled for approximately 5 minutes regarding the health risks of ongoing nicotine use, specifically all types of cancer, heart disease, stroke and respiratory failure. The options available for help with cessation ,the behavioral changes to assist the process, and the option to either gradully reduce usage Or abruptly stop.is also discussed. Pt is also encouraged to set specific goals in number of cigarettes used daily, as well as to set a quit date. Number of cigarettes/cigars currently smoking daily: 2 Depression Untreated and uncontrolled. Pt has not been keeping appts with mental health Provider as she needs to. States she feels "not interested" , also states transpt to her visits are an issue. She has long standing mental illness and needs to Resume regular care. She is not currently suicidal, homicidal or hallucinating. Has appt in am and I have encouraged her strongly to keep this appt and make it beneficial for her Abnormal weight loss Has upcoming colonoscopy for further evaluation Epigastric pain Following botox at [facility] for condition Asthma, cough variant Controlled, no change in medication   She reports chronic nausea  with daily vomiting.  GI is following closely with medication management.  She admits to compliance with PO ferrous sulfate.  She notes that her N/V has been ongoing for 2 years or so.  She reports to me that she has been taking ferrous sulfate x 4 years or so.  Given her symptoms without any noted improvement, we owe it to the patient to verify that her N/V is not ferrous sulfate induced.  She has not had a menstrual cycle in years.  Past Medical History  Diagnosis Date  . Migraines   . Osteoporosis   . Seasonal allergies   . Sinusitis   . Back pain, chronic   . Nicotine addiction   . Constipation   . Vitamin D deficiency   . Gastritis   . Gastroparesis   . Pyloric spasm 03/30/2011  . Gastric outlet obstruction   . COPD (chronic obstructive pulmonary disease) ([place])   . Asthma   . Substance abuse 2008    marijuana  . Peptic ulcer disease   . Depression 2000    h/o suicidal ideation  . Anemia of other chronic disease 11/29/2012  . Chronic abdominal pain   . Nausea and vomiting     recurrent  . 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     is allergic to benadryl; latex; and penicillins.  Current Outpatient Prescriptions on File  Prior to Visit  Medication Sig Dispense Refill  . albuterol (PROVENTIL HFA;VENTOLIN HFA) 108 (90 BASE) MCG/ACT inhaler Inhale 2 puffs into the lungs every 6 (six) hours as needed. Shortness of breath (Patient taking differently: Inhale 2 puffs into the lungs every 6 (six) hours as needed for wheezing or shortness of breath. Shortness of breath) 6.7 g 4  . calcium-vitamin D (OSCAL WITH D) 500-200 MG-UNIT per tablet Take 1 tablet by mouth 2 (two) times daily.    . clobetasol cream (TEMOVATE) 5.64 % Apply 1 application topically 2 (two) times daily as needed (rash). Apply to rash    . DULoxetine (CYMBALTA) 30 MG capsule Take 1 capsule (30 mg total) by mouth daily. 30 capsule 3  . ergocalciferol (VITAMIN D2) 50000 UNITS capsule Take 1 capsule (50,000 Units total) by mouth once a week. One capsule once weekly 12 capsule 1  . estradiol (ESTRACE VAGINAL) 0.1 MG/GM vaginal cream Place 2 g vaginally at bedtime. As directed    . feeding supplement (ENSURE CLINICAL STRENGTH) LIQD Take 237 mLs by mouth 3 (three) times daily with meals. (Patient taking differently: Take 237 mLs by mouth 2 (two) times daily. ) 10 Bottle 3  . ferrous sulfate 325 (65 FE) MG tablet Take 325 mg by mouth daily with breakfast.    . gabapentin (NEURONTIN) 100 MG capsule Take 100 mg by mouth 3 (three) times daily.    . Linaclotide (LINZESS) 290 MCG CAPS capsule Take 1 capsule (290 mcg total) by mouth daily. 30 capsule 5  . loratadine (CLARITIN) 10 MG tablet Take 1 tablet (10 mg total) by mouth daily. 30 tablet 5  . mometasone (NASONEX) 50 MCG/ACT nasal spray Place 2 sprays into the nose 2 (two) times daily. 17 g 12  . NUCYNTA 50 MG TABS tablet Take 50 mg by mouth every 8 (eight) hours as needed for moderate pain.   0  . omeprazole (PRILOSEC) 40 MG capsule Take 1 capsule (40 mg total) by mouth 2 (two) times daily before a meal. 60 capsule 3  . ondansetron (ZOFRAN) 4 MG tablet TAKE 1 TABLET(4 MG) BY MOUTH EVERY 6 HOURS AS NEEDED FOR NAUSEA  OR VOMITING 30 tablet 1  . simvastatin (ZOCOR) 20 MG tablet Take 20 mg by mouth daily.    . sucralfate (CARAFATE) 1 G tablet Crush one tablet and mix in applesauce, take every morning and at bedtime as needed for stomach burning. 60 tablet 3  . tiZANidine (ZANAFLEX) 4 MG tablet Take 4 mg by mouth every 6 (six) hours as needed for muscle spasms.     . budesonide-formoterol (SYMBICORT) 80-4.5 MCG/ACT inhaler Inhale 2 puffs into the lungs 2 (two) times daily. (Patient taking differently: Inhale 2 puffs into the lungs every evening. ) 1 Inhaler 4  . calcium-vitamin D (OSCAL 500/200 D-3) 500-200 MG-UNIT per tablet Take 1 tablet by mouth 2 (two) times daily. 180 tablet 3  . dexlansoprazole (DEXILANT) 60 MG capsule Take 1 capsule (60 mg total) by mouth daily. (Patient not taking: Reported on 12/06/2014) 90 capsule 0   No current facility-administered medications on file prior to visit.    Past Surgical History  Procedure Laterality Date  . Cholecystectomy      ?2002  . Laser  surgery on cervix    . Carpal tunnel release      left hand  . Esophagogastroduodenoscopy  12/25/2010    Dorothyann Peng, MD;  moderate gastritis, ?goo secondary to pylorspasm. BX showed reactive gstropathy no h.pyori, SB mucosa with intramucosal lymphocytosis and partial villous blunting (TTG 4.0 normal)  . Esophageal dilation  12/25/2010    Procedure: ESOPHAGEAL DILATION;  Surgeon: Dorothyann Peng, MD;  [facility];  Service: Endoscopy;;  . Esophagogastroduodenoscopy N/A 11/14/2012    [id number]:[id number] hiatal hernia. Abnormal gastric mucosa of  uncertain significance-status post biopsy. Subjectively, patient may have recurrent symptomatic, pylorospasm.  . Esophagogastroduodenoscopy (egd) with propofol N/A 02/09/2013    elongated stomach, partial lower esophageal ring widely patent. No obvious pyloric stenosis s/p Botox  . Botox injection N/A 02/09/2013    Procedure: BOTOX INJECTION;  Surgeon: Missy Sabins, MD;  [facility];  Service: Endoscopy;  Laterality: N/A;  possible balloon  . Balloon dilation N/A 02/09/2013    Procedure: BALLOON DILATION;  Surgeon: Missy Sabins, MD;  [facility]: [facility];  Service: Endoscopy;  Laterality: N/A;  . Colonoscopy with propofol N/A 09/20/2014    RMR: Normal ileo-colonoscopy    Denies any headaches, dizziness, double vision, fevers, chills, night sweats, diarrhea, constipation, chest pain, heart palpitations, shortness of breath, blood in stool, black tarry stool, urinary pain, urinary burning, urinary frequency, hematuria.   PHYSICAL EXAMINATION  ECOG PERFORMANCE STATUS: 1 - Symptomatic but completely ambulatory  Filed Vitals:   12/06/14 1029  BP: 110/93  Pulse: 59  Temp: 98 F (36.7 C)  Resp: 18    GENERAL:alert, no distress, well nourished, cooperative, smiling and unaccompanied SKIN: skin color, texture, turgor are normal, no rashes or significant lesions HEAD: Normocephalic, No masses, lesions, tenderness or abnormalities EYES: normal, PERRLA, EOMI, Conjunctiva are pink and non-injected EARS: External ears normal OROPHARYNX:lips, buccal mucosa, and tongue normal and mucous membranes are moist  NECK: supple, no adenopathy, thyroid normal size, non-tender, without nodularity, no stridor, non-tender, trachea midline LYMPH:  no palpable lymphadenopathy BREAST:not examined LUNGS: clear to auscultation  HEART: regular rate & rhythm, no murmurs, no gallops, S1 normal and S2 normal ABDOMEN:abdomen soft, non-tender and normal bowel sounds BACK: Back symmetric, no curvature. EXTREMITIES:less then 2 second capillary refill, no joint deformities, effusion, or inflammation, no skin discoloration, no cyanosis  NEURO: alert & oriented x 3 with fluent speech, no focal motor/sensory deficits, gait normal   LABORATORY DATA: CBC    Component Value Date/Time   WBC 6.3 12/06/2014 0911   RBC 3.94 12/06/2014 0911   HGB 11.8* 12/06/2014 0911   HCT 36.7 12/06/2014  0911   PLT 157 12/06/2014 0911   MCV 93.1 12/06/2014 0911   MCH 29.9 12/06/2014 0911   MCHC 32.2 12/06/2014 0911   RDW 12.9 12/06/2014 0911   LYMPHSABS 1.8 12/06/2014 0911   MONOABS 0.5 12/06/2014 0911   EOSABS 0.1 12/06/2014 0911   BASOSABS 0.0 12/06/2014 0911      Chemistry      Component Value Date/Time   NA 137 11/10/2014 0644   K 3.5 11/10/2014 0644   CL 99* 11/10/2014 0644   CO2 28 11/10/2014 0644   BUN 20 11/10/2014 0644   CREATININE 0.76 11/10/2014 0644   CREATININE 0.72 05/02/2014 0733      Component Value Date/Time   CALCIUM 9.9 11/10/2014 0644   ALKPHOS 68 11/10/2014 0644   AST 27 11/10/2014 0644   ALT 21 11/10/2014 0644   BILITOT  0.7 11/10/2014 0644     Lab Results  Component Value Date   IRON 44 12/01/2013   TIBC 336 12/01/2013   FERRITIN 63 12/06/2014      PENDING LABS:   RADIOGRAPHIC STUDIES:  No results found.   PATHOLOGY:    ASSESSMENT AND PLAN:  Anemia Anemia, unexplained at this time.  On PO iron replacement.  Last documented incidence of anemia was Feb 2016 and looking through her [facility] chart, she only has intermittent anemia.  Labs today: CBC diff, ferritin  On chart review, she has a hyperproteinemia for the last few months.  Will add a MM panel.  Persistent nausea with vomiting x 2 years.  GI on the case and changing medications.  From a hematologic standpoint, she has been on ferrous sulfate for 3-4 years +.  We will try to hold ferrous sulfate for 3-4 weeks.  If improved, then we can blame it on this medication and change PO iron replacement.  If not, then we can restart.  Return in 3-4 weeks for follow-up.  Depending on symptoms, we can go back to annual follow-up.  Nicotine dependence Smoking cessation education provided.    She does not meet lung screening criteria due to her young age.    THERAPY PLAN:  HOLD ferrous sulfate for 3-4 weeks to see if this is contributing to her nausea and vomiting.  If so, we will need to  try another PO iron replacement that is typically better tolerated or IV iron replacement when indicated.  All questions were answered. The patient knows to call the clinic with any problems, questions or concerns. We can certainly see the patient much sooner if necessary.  Patient and plan discussed with Dr. Ancil Linsey and she is in agreement with the aforementioned.   This note is electronically signed by: Doy Mince 12/06/2014 11:24 AM

## 2014-12-04 NOTE — Assessment & Plan Note (Addendum)
Anemia, unexplained at this time.  On PO iron replacement.  Last documented incidence of anemia was Feb 2016 and looking through her [facility] chart, she only has intermittent anemia.  Labs today: CBC diff, ferritin  On chart review, she has a hyperproteinemia for the last few months.  Will add a MM panel.  Persistent nausea with vomiting x 2 years.  GI on the case and changing medications.  From a hematologic standpoint, she has been on ferrous sulfate for 3-4 years +.  We will try to hold ferrous sulfate for 3-4 weeks.  If improved, then we can blame it on this medication and change PO iron replacement.  If not, then we can restart.  Return in 3-4 weeks for follow-up.  Depending on symptoms, we can go back to annual follow-up.

## 2014-12-05 ENCOUNTER — Ambulatory Visit (HOSPITAL_COMMUNITY): Payer: Medicare Other | Admitting: Hematology & Oncology

## 2014-12-05 NOTE — Telephone Encounter (Signed)
Pt is aware.  

## 2014-12-06 ENCOUNTER — Encounter (HOSPITAL_COMMUNITY): Payer: Self-pay | Admitting: Oncology

## 2014-12-06 ENCOUNTER — Encounter (HOSPITAL_COMMUNITY): Payer: Medicare Other | Attending: Oncology

## 2014-12-06 ENCOUNTER — Encounter (HOSPITAL_COMMUNITY): Payer: Medicare Other | Attending: Oncology | Admitting: Oncology

## 2014-12-06 VITALS — BP 110/93 | HR 59 | Temp 98.0°F | Resp 18 | Wt 142.6 lb

## 2014-12-06 DIAGNOSIS — D649 Anemia, unspecified: Secondary | ICD-10-CM | POA: Insufficient documentation

## 2014-12-06 DIAGNOSIS — E8809 Other disorders of plasma-protein metabolism, not elsewhere classified: Secondary | ICD-10-CM

## 2014-12-06 DIAGNOSIS — F17218 Nicotine dependence, cigarettes, with other nicotine-induced disorders: Secondary | ICD-10-CM | POA: Diagnosis not present

## 2014-12-06 LAB — CBC WITH DIFFERENTIAL/PLATELET
BASOS PCT: 0 %
Basophils Absolute: 0 10*3/uL (ref 0.0–0.1)
Eosinophils Absolute: 0.1 10*3/uL (ref 0.0–0.7)
Eosinophils Relative: 1 %
HEMATOCRIT: 36.7 % (ref 36.0–46.0)
Hemoglobin: 11.8 g/dL — ABNORMAL LOW (ref 12.0–15.0)
LYMPHS ABS: 1.8 10*3/uL (ref 0.7–4.0)
Lymphocytes Relative: 29 %
MCH: 29.9 pg (ref 26.0–34.0)
MCHC: 32.2 g/dL (ref 30.0–36.0)
MCV: 93.1 fL (ref 78.0–100.0)
MONO ABS: 0.5 10*3/uL (ref 0.1–1.0)
MONOS PCT: 9 %
NEUTROS ABS: 3.8 10*3/uL (ref 1.7–7.7)
Neutrophils Relative %: 61 %
Platelets: 157 10*3/uL (ref 150–400)
RBC: 3.94 MIL/uL (ref 3.87–5.11)
RDW: 12.9 % (ref 11.5–15.5)
WBC: 6.3 10*3/uL (ref 4.0–10.5)

## 2014-12-06 LAB — FERRITIN: Ferritin: 63 ng/mL (ref 11–307)

## 2014-12-06 NOTE — Patient Instructions (Signed)
[place] at [facility] Discharge Instructions  RECOMMENDATIONS MADE BY THE CONSULTANT AND ANY TEST RESULTS WILL BE SENT TO YOUR REFERRING PHYSICIAN.  Exam and discussion by Robynn Pane, PA-C Hold your iron pill for now to see if it's the cause of your nausea  Follow-up in 3 - 4 weeks with labs and office visit.  Thank you for choosing [place] at [facility] to provide your oncology and hematology care.  To afford each patient quality time with our provider, please arrive at least 15 minutes before your scheduled appointment time.    You need to re-schedule your appointment should you arrive 10 or more minutes late.  We strive to give you quality time with our providers, and arriving late affects you and other patients whose appointments are after yours.  Also, if you no show three or more times for appointments you may be dismissed from the clinic at the providers discretion.     Again, thank you for choosing [facility].  Our hope is that these requests will decrease the amount of time that you wait before being seen by our physicians.       _____________________________________________________________  Should you have questions after your visit to [facility], please contact our office at [phone] between the hours of 8:30 a.m. and 4:30 p.m.  Voicemails left after 4:30 p.m. will not be returned until the following business day.  For prescription refill requests, have your pharmacy contact our office.

## 2014-12-07 LAB — KAPPA/LAMBDA LIGHT CHAINS
Kappa free light chain: 15.93 mg/L (ref 3.30–19.40)
Kappa, lambda light chain ratio: 0.95 (ref 0.26–1.65)
Lambda free light chains: 16.84 mg/L (ref 5.71–26.30)

## 2014-12-07 NOTE — Progress Notes (Signed)
Labs drawn

## 2014-12-12 LAB — MULTIPLE MYELOMA PANEL, SERUM
ALBUMIN SERPL ELPH-MCNC: 4.1 g/dL (ref 2.9–4.4)
Albumin/Glob SerPl: 1.6 (ref 0.7–1.7)
Alpha 1: 0.2 g/dL (ref 0.0–0.4)
Alpha2 Glob SerPl Elph-Mcnc: 0.8 g/dL (ref 0.4–1.0)
B-GLOBULIN SERPL ELPH-MCNC: 0.9 g/dL (ref 0.7–1.3)
Gamma Glob SerPl Elph-Mcnc: 0.7 g/dL (ref 0.4–1.8)
Globulin, Total: 2.6 g/dL (ref 2.2–3.9)
IGA: 192 mg/dL (ref 87–352)
IGM, SERUM: 80 mg/dL (ref 26–217)
IgG (Immunoglobin G), Serum: 785 mg/dL (ref 700–1600)
TOTAL PROTEIN ELP: 6.7 g/dL (ref 6.0–8.5)

## 2014-12-20 ENCOUNTER — Encounter (HOSPITAL_COMMUNITY): Payer: Self-pay

## 2014-12-20 ENCOUNTER — Emergency Department (HOSPITAL_COMMUNITY)
Admission: EM | Admit: 2014-12-20 | Discharge: 2014-12-20 | Disposition: A | Discharge status: Home / Self Care | Payer: Medicare Other | Attending: Emergency Medicine | Admitting: Emergency Medicine

## 2014-12-20 ENCOUNTER — Emergency Department (HOSPITAL_COMMUNITY): Payer: Medicare Other

## 2014-12-20 DIAGNOSIS — G8929 Other chronic pain: Secondary | ICD-10-CM | POA: Diagnosis not present

## 2014-12-20 DIAGNOSIS — Z91040 Latex allergy status: Secondary | ICD-10-CM | POA: Insufficient documentation

## 2014-12-20 DIAGNOSIS — R1013 Epigastric pain: Secondary | ICD-10-CM | POA: Diagnosis not present

## 2014-12-20 DIAGNOSIS — Z331 Pregnant state, incidental: Secondary | ICD-10-CM | POA: Diagnosis not present

## 2014-12-20 DIAGNOSIS — Z9049 Acquired absence of other specified parts of digestive tract: Secondary | ICD-10-CM | POA: Diagnosis not present

## 2014-12-20 DIAGNOSIS — D649 Anemia, unspecified: Secondary | ICD-10-CM | POA: Diagnosis not present

## 2014-12-20 DIAGNOSIS — Z872 Personal history of diseases of the skin and subcutaneous tissue: Secondary | ICD-10-CM | POA: Insufficient documentation

## 2014-12-20 DIAGNOSIS — J449 Chronic obstructive pulmonary disease, unspecified: Secondary | ICD-10-CM | POA: Diagnosis not present

## 2014-12-20 DIAGNOSIS — R1110 Vomiting, unspecified: Secondary | ICD-10-CM | POA: Insufficient documentation

## 2014-12-20 DIAGNOSIS — Z79899 Other long term (current) drug therapy: Secondary | ICD-10-CM | POA: Diagnosis not present

## 2014-12-20 DIAGNOSIS — M810 Age-related osteoporosis without current pathological fracture: Secondary | ICD-10-CM | POA: Diagnosis not present

## 2014-12-20 DIAGNOSIS — E559 Vitamin D deficiency, unspecified: Secondary | ICD-10-CM | POA: Insufficient documentation

## 2014-12-20 DIAGNOSIS — Z8719 Personal history of other diseases of the digestive system: Secondary | ICD-10-CM | POA: Insufficient documentation

## 2014-12-20 DIAGNOSIS — R112 Nausea with vomiting, unspecified: Secondary | ICD-10-CM | POA: Diagnosis not present

## 2014-12-20 DIAGNOSIS — Z7951 Long term (current) use of inhaled steroids: Secondary | ICD-10-CM | POA: Diagnosis not present

## 2014-12-20 DIAGNOSIS — Z880 Allergy status to penicillin: Secondary | ICD-10-CM | POA: Insufficient documentation

## 2014-12-20 DIAGNOSIS — G43909 Migraine, unspecified, not intractable, without status migrainosus: Secondary | ICD-10-CM | POA: Insufficient documentation

## 2014-12-20 DIAGNOSIS — Z8711 Personal history of peptic ulcer disease: Secondary | ICD-10-CM | POA: Insufficient documentation

## 2014-12-20 DIAGNOSIS — F17210 Nicotine dependence, cigarettes, uncomplicated: Secondary | ICD-10-CM | POA: Insufficient documentation

## 2014-12-20 DIAGNOSIS — R109 Unspecified abdominal pain: Secondary | ICD-10-CM | POA: Diagnosis not present

## 2014-12-20 LAB — URINALYSIS, ROUTINE W REFLEX MICROSCOPIC
BILIRUBIN URINE: NEGATIVE
GLUCOSE, UA: NEGATIVE mg/dL
HGB URINE DIPSTICK: NEGATIVE
KETONES UR: 40 mg/dL — AB
Leukocytes, UA: NEGATIVE
Nitrite: NEGATIVE
PROTEIN: NEGATIVE mg/dL
Specific Gravity, Urine: 1.025 (ref 1.005–1.030)
pH: 6.5 (ref 5.0–8.0)

## 2014-12-20 LAB — LIPASE, BLOOD: LIPASE: 27 U/L (ref 11–51)

## 2014-12-20 LAB — COMPREHENSIVE METABOLIC PANEL
ALK PHOS: 71 U/L (ref 38–126)
ALT: 18 U/L (ref 14–54)
ANION GAP: 15 (ref 5–15)
AST: 27 U/L (ref 15–41)
Albumin: 5.1 g/dL — ABNORMAL HIGH (ref 3.5–5.0)
BUN: 20 mg/dL (ref 6–20)
CALCIUM: 10.4 mg/dL — AB (ref 8.9–10.3)
CO2: 21 mmol/L — AB (ref 22–32)
Chloride: 103 mmol/L (ref 101–111)
Creatinine, Ser: 0.88 mg/dL (ref 0.44–1.00)
Glucose, Bld: 155 mg/dL — ABNORMAL HIGH (ref 65–99)
Potassium: 3 mmol/L — ABNORMAL LOW (ref 3.5–5.1)
SODIUM: 139 mmol/L (ref 135–145)
Total Bilirubin: 0.8 mg/dL (ref 0.3–1.2)
Total Protein: 8.2 g/dL — ABNORMAL HIGH (ref 6.5–8.1)

## 2014-12-20 LAB — CBC
HCT: 37.7 % (ref 36.0–46.0)
HEMOGLOBIN: 12.9 g/dL (ref 12.0–15.0)
MCH: 30.6 pg (ref 26.0–34.0)
MCHC: 34.2 g/dL (ref 30.0–36.0)
MCV: 89.5 fL (ref 78.0–100.0)
Platelets: 176 10*3/uL (ref 150–400)
RBC: 4.21 MIL/uL (ref 3.87–5.11)
RDW: 13.1 % (ref 11.5–15.5)
WBC: 23 10*3/uL — AB (ref 4.0–10.5)

## 2014-12-20 LAB — I-STAT BETA HCG BLOOD, ED (MC, WL, AP ONLY): I-stat hCG, quantitative: 7.5 m[IU]/mL — ABNORMAL HIGH (ref <5)

## 2014-12-20 LAB — HCG, QUANTITATIVE, PREGNANCY: HCG, BETA CHAIN, QUANT, S: 6 m[IU]/mL — AB (ref <5)

## 2014-12-20 MED ORDER — POTASSIUM CHLORIDE 10 MEQ/100ML IV SOLN
10.0000 meq | Freq: Once | INTRAVENOUS | Status: AC
  Administered 2014-12-20: 10 meq via INTRAVENOUS
  Filled 2014-12-20: qty 100

## 2014-12-20 MED ORDER — ONDANSETRON HCL 4 MG/2ML IJ SOLN
4.0000 mg | Freq: Once | INTRAMUSCULAR | Status: AC
  Administered 2014-12-20: 4 mg via INTRAVENOUS
  Filled 2014-12-20: qty 2

## 2014-12-20 MED ORDER — SODIUM CHLORIDE 0.9 % IV BOLUS (SEPSIS)
1000.0000 mL | Freq: Once | INTRAVENOUS | Status: AC
  Administered 2014-12-20: 1000 mL via INTRAVENOUS

## 2014-12-20 MED ORDER — HYDROMORPHONE HCL 1 MG/ML IJ SOLN
1.0000 mg | Freq: Once | INTRAMUSCULAR | Status: AC
  Administered 2014-12-20: 1 mg via INTRAVENOUS
  Filled 2014-12-20: qty 1

## 2014-12-20 MED ORDER — METOCLOPRAMIDE HCL 5 MG/ML IJ SOLN
5.0000 mg | Freq: Once | INTRAMUSCULAR | Status: AC
  Administered 2014-12-20: 5 mg via INTRAVENOUS
  Filled 2014-12-20: qty 2

## 2014-12-20 MED ORDER — SODIUM CHLORIDE 0.9 % IV SOLN
INTRAVENOUS | Status: DC
  Administered 2014-12-20: 18:00:00 via INTRAVENOUS

## 2014-12-20 MED ORDER — LORAZEPAM 2 MG/ML IJ SOLN
1.0000 mg | Freq: Once | INTRAMUSCULAR | Status: AC
  Administered 2014-12-20: 1 mg via INTRAVENOUS
  Filled 2014-12-20: qty 1

## 2014-12-20 MED ORDER — ONDANSETRON 8 MG PO TBDP: 8.0000 mg | ORAL_TABLET | Freq: Three times a day (TID) | ORAL | Status: DC | PRN

## 2014-12-20 MED ORDER — IOHEXOL 300 MG/ML  SOLN
100.0000 mL | Freq: Once | INTRAMUSCULAR | Status: AC | PRN
  Administered 2014-12-20: 21:00:00 via INTRAVENOUS

## 2014-12-20 NOTE — ED Notes (Signed)
Patient states she began having abdominal pain, vomiting, and dry heaving since 1100 today. Patient has a history of gastroparesis.

## 2014-12-20 NOTE — ED Notes (Signed)
Pt given urine cup and is aware urine sample is needed

## 2014-12-20 NOTE — ED Provider Notes (Signed)
CSN: [id number]     Arrival date & time 12/20/14  1516 History   First MD Initiated Contact with Patient 12/20/14 1723     Chief Complaint  Patient presents with  . Abdominal Pain  . Emesis     (Consider location/radiation/quality/duration/timing/severity/associated sxs/prior Treatment) HPI Comments: Patient here with persistent abdominal pain with associated nausea and vomiting that began after she ate today. Reviewed the patient's old records extensively and she does have a history of cyclical vomiting from possibly marijuana use. Patient last used marijuana yesterday. Denies any fever or chills. Emesis is nonbilious. Has had watery diarrhea without blood. Abdominal pain is located epigastric does not radiate to her back. She is status post cholecystectomy. Symptoms persistent and nothing makes them better. No treatment used prior to arrival.  Patient is a 34 y.o. female presenting with abdominal pain and vomiting. The history is provided by the patient and a relative.  Abdominal Pain Associated symptoms: vomiting   Emesis Associated symptoms: abdominal pain     Past Medical History  Diagnosis Date  . Migraines   . Osteoporosis   . Seasonal allergies   . Sinusitis   . Back pain, chronic   . Nicotine addiction   . Constipation   . Vitamin D deficiency   . Gastritis   . Gastroparesis   . Pyloric spasm 03/30/2011  . Gastric outlet obstruction   . COPD (chronic obstructive pulmonary disease) ([place])   . Asthma   . Substance abuse 2008    marijuana  . Peptic ulcer disease   . Depression 2000    h/o suicidal ideation  . Anemia of other chronic disease 11/29/2012  . Chronic abdominal pain   . Nausea and vomiting     recurrent  . Cyst of skin     mid spine area   Past Surgical History  Procedure Laterality Date  . Cholecystectomy      ?2002  . Laser surgery on cervix    . Carpal tunnel release      left hand  . Esophagogastroduodenoscopy  12/25/2010    Dorothyann Peng,  MD;  moderate gastritis, ?goo secondary to pylorspasm. BX showed reactive gstropathy no h.pyori, SB mucosa with intramucosal lymphocytosis and partial villous blunting (TTG 4.0 normal)  . Esophageal dilation  12/25/2010    Procedure: ESOPHAGEAL DILATION;  Surgeon: Dorothyann Peng, MD;  Location: [facility];  Service: Endoscopy;;  . Esophagogastroduodenoscopy N/A 11/14/2012    [id number] hiatal hernia. Abnormal gastric mucosa of  uncertain significance-status post biopsy. Subjectively, patient may have recurrent symptomatic, pylorospasm.  . Esophagogastroduodenoscopy (egd) with propofol N/A 02/09/2013    elongated stomach, partial lower esophageal ring widely patent. No obvious pyloric stenosis s/p Botox  . Botox injection N/A 02/09/2013    Procedure: BOTOX INJECTION;  Surgeon: Missy Sabins, MD;  Location: [facility] ENDOSCOPY;  Service: Endoscopy;  Laterality: N/A;  possible balloon  . Balloon dilation N/A 02/09/2013    Procedure: BALLOON DILATION;  Surgeon: Missy Sabins, MD;  Location: [facility] ENDOSCOPY;  Service: Endoscopy;  Laterality: N/A;  . Colonoscopy with propofol N/A 09/20/2014    RMR: Normal ileo-colonoscopy   Family History  Problem Relation Age of Onset  . Diabetes Father   . Liver disease Father     liver transplant at [facility], age 48  . Colon cancer Neg Hx   . Lung cancer Mother 12  . Heart disease Maternal Grandmother   . Parkinson's disease Maternal Grandfather   . Multiple sclerosis Sister  96  . Depression Sister 16    bipolar and schizophrenic  . Alcohol abuse Sister    Social History  Substance Use Topics  . Smoking status: Current Every Day Smoker -- 0.10 packs/day for 15 years    Types: Cigarettes  . Smokeless tobacco: Never Used     Comment: smokes two  cigarette daily  . Alcohol Use: No     Comment: none since July 2016. Mar[place] Kitchen Periodic heavy drinker for months at a time.   OB History    No data available     Review of Systems  Gastrointestinal: Positive for vomiting and  abdominal pain.  All other systems reviewed and are negative.     Allergies  Benadryl; Latex; and Penicillins  Home Medications   Prior to Admission medications   Medication Sig Start Date End Date Taking? Authorizing Provider  albuterol (PROVENTIL HFA;VENTOLIN HFA) 108 (90 BASE) MCG/ACT inhaler Inhale 2 puffs into the lungs every 6 (six) hours as needed. Shortness of breath Patient taking differently: Inhale 2 puffs into the lungs every 6 (six) hours as needed for wheezing or shortness of breath. Shortness of breath 11/14/12 05/27/15  [name], MD  budesonide-formoterol [facility]) 80-4.5 MCG/ACT inhaler Inhale 2 puffs into the lungs 2 (two) times daily. Patient taking differently: Inhale 2 puffs into the lungs every evening.  11/14/12 05/27/15  [name], MD  calcium-vitamin D (OSCAL 500/200 D-3) 500-200 MG-UNIT per tablet Take 1 tablet by mouth 2 (two) times daily. 11/14/12 11/08/14  [name], MD  calcium-vitamin D (OSCAL WITH D) 500-200 MG-UNIT per tablet Take 1 tablet by mouth 2 (two) times daily.    Historical Provider, MD  clobetasol cream (TEMOVATE) [id number] % Apply 1 application topically 2 (two) times daily as needed (rash). Apply to rash    Historical Provider, MD  dexlansoprazole (DEXILANT) 60 MG capsule Take 1 capsule (60 mg total) by mouth daily. Patient not taking: Reported on 12/06/2014 11/30/14   Orvil Feil, NP  DULoxetine (CYMBALTA) 30 MG capsule Take 1 capsule (30 mg total) by mouth daily. 05/16/14   [name], MD  ergocalciferol (VITAMIN D2) 50000 UNITS capsule Take 1 capsule (50,000 Units total) by mouth once a week. One capsule once weekly 05/16/14   [name], MD  estradiol (ESTRACE VAGINAL) 0.1 MG/GM vaginal cream Place 2 g vaginally at bedtime. As directed    Historical Provider, MD  feeding supplement (ENSURE CLINICAL STRENGTH) LIQD Take 237 mLs by mouth 3 (three) times daily with meals. Patient taking differently: Take 237 mLs  by mouth 2 (two) times daily.  01/01/11   Nishant Dhungel, MD  ferrous sulfate 325 (65 FE) MG tablet Take 325 mg by mouth daily with breakfast.    Historical Provider, MD  gabapentin (NEURONTIN) 100 MG capsule Take 100 mg by mouth 3 (three) times daily.    Historical Provider, MD  Linaclotide Rolan Lipa) 290 MCG CAPS capsule Take 1 capsule (290 mcg total) by mouth daily. 08/27/14   Mahala Menghini, PA-C  loratadine (CLARITIN) 10 MG tablet Take 1 tablet (10 mg total) by mouth daily. 10/09/11   [name], MD  mometasone (NASONEX) 50 MCG/ACT nasal spray Place 2 sprays into the nose 2 (two) times daily. 05/16/14   [name], MD  NUCYNTA 50 MG TABS tablet Take 50 mg by mouth every 8 (eight) hours as needed for moderate pain.  08/27/14   Historical Provider, MD  omeprazole (PRILOSEC) 40 MG capsule  Take 1 capsule (40 mg total) by mouth 2 (two) times daily before a meal. 11/20/14   Mahala Menghini, PA-C  ondansetron (ZOFRAN) 4 MG tablet TAKE 1 TABLET(4 MG) BY MOUTH EVERY 6 HOURS AS NEEDED FOR NAUSEA OR VOMITING 11/30/14   Orvil Feil, NP  simvastatin (ZOCOR) 20 MG tablet Take 20 mg by mouth daily.    Historical Provider, MD  sucralfate (CARAFATE) 1 G tablet Crush one tablet and mix in applesauce, take every morning and at bedtime as needed for stomach burning. 08/27/14   Mahala Menghini, PA-C  tiZANidine (ZANAFLEX) 4 MG tablet Take 4 mg by mouth every 6 (six) hours as needed for muscle spasms.  10/27/13   Historical Provider, MD   BP 125/89 mmHg  Pulse 65  Temp(Src) 97.7 F (36.5 C) (Oral)  Resp 24  SpO2 100%  LMP 01/19/1997 Physical Exam  Constitutional: She is oriented to person, place, and time. She appears well-developed and well-nourished.  Non-toxic appearance. No distress.  HENT:  Head: Normocephalic and atraumatic.  Eyes: Conjunctivae, EOM and lids are normal. Pupils are equal, round, and reactive to light.  Neck: Normal range of motion. Neck supple. No tracheal deviation present. No  thyroid mass present.  Cardiovascular: Normal rate, regular rhythm and normal heart sounds.  Exam reveals no gallop.   No murmur heard. Pulmonary/Chest: Effort normal and breath sounds normal. No stridor. No respiratory distress. She has no decreased breath sounds. She has no wheezes. She has no rhonchi. She has no rales.  Abdominal: Soft. Normal appearance and bowel sounds are normal. She exhibits no distension. There is tenderness in the epigastric area. There is guarding. There is no rigidity, no rebound and no CVA tenderness.    Musculoskeletal: Normal range of motion. She exhibits no edema or tenderness.  Neurological: She is alert and oriented to person, place, and time. She has normal strength. No cranial nerve deficit or sensory deficit. GCS eye subscore is 4. GCS verbal subscore is 5. GCS motor subscore is 6.  Skin: Skin is warm and dry. No abrasion and no rash noted.  Psychiatric: She has a normal mood and affect. Her speech is normal and behavior is normal.  Nursing note and vitals reviewed.   ED Course  Procedures (including critical care time) Labs Review Labs Reviewed  COMPREHENSIVE METABOLIC PANEL - Abnormal; Notable for the following:    Potassium 3.0 (*)    CO2 21 (*)    Glucose, Bld 155 (*)    Calcium 10.4 (*)    Total Protein 8.2 (*)    Albumin 5.1 (*)    All other components within normal limits  CBC - Abnormal; Notable for the following:    WBC 23.0 (*)    All other components within normal limits  I-STAT BETA HCG BLOOD, ED ([facility], [facility], [facility] ONLY) - Abnormal; Notable for the following:    I-stat hCG, quantitative 7.5 (*)    All other components within normal limits  LIPASE, BLOOD  URINALYSIS, ROUTINE W REFLEX MICROSCOPIC (NOT AT [facility])  HCG, QUANTITATIVE, PREGNANCY    Imaging Review No results found. I have personally reviewed and evaluated these images and lab results as part of my medical decision-making.   EKG Interpretation None      MDM   Final  diagnoses:  None    8:29 PM Patient's beta-hCG noted and patient states that she's not had a menstrual period 14 years. She does not think she is pregnant. She is comfortable  with going ahead and having the abdominal CT performed despite the risk of pregnancy  9:44 PM Patient's abdominal CT without acute findings. Patient admits to using copious amounts of marijuana daily. Suspect that this is a component of her chronic vomiting. She was instructed to smoke less.  Lacretia Leigh, MD 12/20/14 [phone]

## 2014-12-20 NOTE — Discharge Instructions (Signed)

## 2014-12-22 ENCOUNTER — Encounter (HOSPITAL_COMMUNITY): Payer: Self-pay | Admitting: *Deleted

## 2014-12-22 ENCOUNTER — Emergency Department (HOSPITAL_COMMUNITY)
Admission: EM | Admit: 2014-12-22 | Discharge: 2014-12-22 | Disposition: A | Discharge status: Home / Self Care | Payer: Medicare Other | Attending: Emergency Medicine | Admitting: Emergency Medicine

## 2014-12-22 DIAGNOSIS — Z79810 Long term (current) use of selective estrogen receptor modulators (SERMs): Secondary | ICD-10-CM | POA: Insufficient documentation

## 2014-12-22 DIAGNOSIS — Z91040 Latex allergy status: Secondary | ICD-10-CM | POA: Insufficient documentation

## 2014-12-22 DIAGNOSIS — R1013 Epigastric pain: Secondary | ICD-10-CM

## 2014-12-22 DIAGNOSIS — Z8679 Personal history of other diseases of the circulatory system: Secondary | ICD-10-CM | POA: Diagnosis not present

## 2014-12-22 DIAGNOSIS — M810 Age-related osteoporosis without current pathological fracture: Secondary | ICD-10-CM | POA: Insufficient documentation

## 2014-12-22 DIAGNOSIS — J449 Chronic obstructive pulmonary disease, unspecified: Secondary | ICD-10-CM | POA: Insufficient documentation

## 2014-12-22 DIAGNOSIS — Z880 Allergy status to penicillin: Secondary | ICD-10-CM | POA: Diagnosis not present

## 2014-12-22 DIAGNOSIS — F329 Major depressive disorder, single episode, unspecified: Secondary | ICD-10-CM | POA: Insufficient documentation

## 2014-12-22 DIAGNOSIS — Z862 Personal history of diseases of the blood and blood-forming organs and certain disorders involving the immune mechanism: Secondary | ICD-10-CM | POA: Diagnosis not present

## 2014-12-22 DIAGNOSIS — F17210 Nicotine dependence, cigarettes, uncomplicated: Secondary | ICD-10-CM | POA: Insufficient documentation

## 2014-12-22 DIAGNOSIS — Z872 Personal history of diseases of the skin and subcutaneous tissue: Secondary | ICD-10-CM | POA: Diagnosis not present

## 2014-12-22 DIAGNOSIS — Z7951 Long term (current) use of inhaled steroids: Secondary | ICD-10-CM | POA: Diagnosis not present

## 2014-12-22 DIAGNOSIS — Z79899 Other long term (current) drug therapy: Secondary | ICD-10-CM | POA: Insufficient documentation

## 2014-12-22 DIAGNOSIS — G8929 Other chronic pain: Secondary | ICD-10-CM | POA: Diagnosis not present

## 2014-12-22 DIAGNOSIS — K2970 Gastritis, unspecified, without bleeding: Secondary | ICD-10-CM | POA: Diagnosis not present

## 2014-12-22 DIAGNOSIS — R112 Nausea with vomiting, unspecified: Secondary | ICD-10-CM | POA: Insufficient documentation

## 2014-12-22 DIAGNOSIS — E559 Vitamin D deficiency, unspecified: Secondary | ICD-10-CM | POA: Diagnosis not present

## 2014-12-22 DIAGNOSIS — K5900 Constipation, unspecified: Secondary | ICD-10-CM | POA: Diagnosis not present

## 2014-12-22 DIAGNOSIS — Z8711 Personal history of peptic ulcer disease: Secondary | ICD-10-CM | POA: Diagnosis not present

## 2014-12-22 LAB — COMPREHENSIVE METABOLIC PANEL
ALBUMIN: 4.9 g/dL (ref 3.5–5.0)
ALK PHOS: 67 U/L (ref 38–126)
ALT: 18 U/L (ref 14–54)
AST: 24 U/L (ref 15–41)
Anion gap: 13 (ref 5–15)
BILIRUBIN TOTAL: 0.9 mg/dL (ref 0.3–1.2)
BUN: 17 mg/dL (ref 6–20)
CALCIUM: 10.2 mg/dL (ref 8.9–10.3)
CO2: 28 mmol/L (ref 22–32)
CREATININE: 0.86 mg/dL (ref 0.44–1.00)
Chloride: 98 mmol/L — ABNORMAL LOW (ref 101–111)
GFR calc Af Amer: >60 mL/min (ref >60)
GLUCOSE: 92 mg/dL (ref 65–99)
POTASSIUM: 3.4 mmol/L — AB (ref 3.5–5.1)
Sodium: 139 mmol/L (ref 135–145)
TOTAL PROTEIN: 8.3 g/dL — AB (ref 6.5–8.1)

## 2014-12-22 LAB — CBC WITH DIFFERENTIAL/PLATELET
BASOS ABS: 0 10*3/uL (ref 0.0–0.1)
Basophils Relative: 0 %
EOS PCT: 0 %
Eosinophils Absolute: 0 10*3/uL (ref 0.0–0.7)
HEMATOCRIT: 37.6 % (ref 36.0–46.0)
Hemoglobin: 12.7 g/dL (ref 12.0–15.0)
LYMPHS ABS: 2.4 10*3/uL (ref 0.7–4.0)
LYMPHS PCT: 19 %
MCH: 30.1 pg (ref 26.0–34.0)
MCHC: 33.8 g/dL (ref 30.0–36.0)
MCV: 89.1 fL (ref 78.0–100.0)
MONO ABS: 1.1 10*3/uL — AB (ref 0.1–1.0)
Monocytes Relative: 9 %
NEUTROS ABS: 9 10*3/uL — AB (ref 1.7–7.7)
Neutrophils Relative %: 72 %
PLATELETS: 171 10*3/uL (ref 150–400)
RBC: 4.22 MIL/uL (ref 3.87–5.11)
RDW: 13 % (ref 11.5–15.5)
WBC: 12.5 10*3/uL — ABNORMAL HIGH (ref 4.0–10.5)

## 2014-12-22 LAB — URINALYSIS, ROUTINE W REFLEX MICROSCOPIC
GLUCOSE, UA: NEGATIVE mg/dL
HGB URINE DIPSTICK: NEGATIVE
Leukocytes, UA: NEGATIVE
Nitrite: NEGATIVE
PROTEIN: 30 mg/dL — AB
Specific Gravity, Urine: 1.03 (ref 1.005–1.030)
pH: 6.5 (ref 5.0–8.0)

## 2014-12-22 LAB — URINE MICROSCOPIC-ADD ON: RBC / HPF: NONE SEEN RBC/hpf (ref 0–5)

## 2014-12-22 LAB — LIPASE, BLOOD: Lipase: 20 U/L (ref 11–51)

## 2014-12-22 MED ORDER — PROMETHAZINE HCL 25 MG PO TABS: 25.0000 mg | ORAL_TABLET | Freq: Four times a day (QID) | ORAL | Status: DC | PRN

## 2014-12-22 MED ORDER — LORAZEPAM 2 MG/ML IJ SOLN
1.0000 mg | Freq: Once | INTRAMUSCULAR | Status: AC
  Administered 2014-12-22: 1 mg via INTRAVENOUS
  Filled 2014-12-22: qty 1

## 2014-12-22 MED ORDER — HYDROMORPHONE HCL 1 MG/ML IJ SOLN
1.0000 mg | Freq: Once | INTRAMUSCULAR | Status: AC
  Administered 2014-12-22: 1 mg via INTRAVENOUS
  Filled 2014-12-22: qty 1

## 2014-12-22 MED ORDER — ONDANSETRON 4 MG PO TBDP
4.0000 mg | ORAL_TABLET | Freq: Once | ORAL | Status: AC
  Administered 2014-12-22: 4 mg via ORAL
  Filled 2014-12-22: qty 1

## 2014-12-22 MED ORDER — POTASSIUM CHLORIDE CRYS ER 20 MEQ PO TBCR
40.0000 meq | EXTENDED_RELEASE_TABLET | Freq: Once | ORAL | Status: AC
  Administered 2014-12-22: 40 meq via ORAL
  Filled 2014-12-22: qty 2

## 2014-12-22 MED ORDER — SODIUM CHLORIDE 0.9 % IV BOLUS (SEPSIS)
1000.0000 mL | Freq: Once | INTRAVENOUS | Status: AC
  Administered 2014-12-22: 1000 mL via INTRAVENOUS

## 2014-12-22 MED ORDER — KETOROLAC TROMETHAMINE 60 MG/2ML IM SOLN: 60.0000 mg | Freq: Once | INTRAMUSCULAR | Status: DC

## 2014-12-22 MED ORDER — PROMETHAZINE HCL 25 MG/ML IJ SOLN
25.0000 mg | Freq: Once | INTRAMUSCULAR | Status: AC
  Administered 2014-12-22: 25 mg via INTRAVENOUS
  Filled 2014-12-22: qty 1

## 2014-12-22 NOTE — Discharge Instructions (Signed)
Nausea and Vomiting Nausea is a sick feeling that often comes before throwing up (vomiting). Vomiting is a reflex where stomach contents come out of your mouth. Vomiting can cause severe loss of body fluids (dehydration). Children and elderly adults can become dehydrated quickly, especially if they also have diarrhea. Nausea and vomiting are symptoms of a condition or disease. It is important to find the cause of your symptoms. CAUSES   Direct irritation of the stomach lining. This irritation can result from increased acid production (gastroesophageal reflux disease), infection, food poisoning, taking certain medicines (such as nonsteroidal anti-inflammatory drugs), alcohol use, or tobacco use.  Signals from the brain.These signals could be caused by a headache, heat exposure, an inner ear disturbance, increased pressure in the brain from injury, infection, a tumor, or a concussion, pain, emotional stimulus, or metabolic problems.  An obstruction in the gastrointestinal tract (bowel obstruction).  Illnesses such as diabetes, hepatitis, gallbladder problems, appendicitis, kidney problems, cancer, sepsis, atypical symptoms of a heart attack, or eating disorders.  Medical treatments such as chemotherapy and radiation.  Receiving medicine that makes you sleep (general anesthetic) during surgery. DIAGNOSIS Your caregiver may ask for tests to be done if the problems do not improve after a few days. Tests may also be done if symptoms are severe or if the reason for the nausea and vomiting is not clear. Tests may include:  Urine tests.  Blood tests.  Stool tests.  Cultures (to look for evidence of infection).  X-rays or other imaging studies. Test results can help your caregiver make decisions about treatment or the need for additional tests. TREATMENT You need to stay well hydrated. Drink frequently but in small amounts.You may wish to drink water, sports drinks, clear broth, or eat frozen  ice pops or gelatin dessert to help stay hydrated.When you eat, eating slowly may help prevent nausea.There are also some antinausea medicines that may help prevent nausea. HOME CARE INSTRUCTIONS   Take all medicine as directed by your caregiver.  If you do not have an appetite, do not force yourself to eat. However, you must continue to drink fluids.  If you have an appetite, eat a normal diet unless your caregiver tells you differently.  Eat a variety of complex carbohydrates (rice, wheat, potatoes, bread), lean meats, yogurt, fruits, and vegetables.  Avoid high-fat foods because they are more difficult to digest.  Drink enough water and fluids to keep your urine clear or pale yellow.  If you are dehydrated, ask your caregiver for specific rehydration instructions. Signs of dehydration may include:  Severe thirst.  Dry lips and mouth.  Dizziness.  Dark urine.  Decreasing urine frequency and amount.  Confusion.  Rapid breathing or pulse. SEEK IMMEDIATE MEDICAL CARE IF:   You have blood or brown flecks (like coffee grounds) in your vomit.  You have black or bloody stools.  You have a severe headache or stiff neck.  You are confused.  You have severe abdominal pain.  You have chest pain or trouble breathing.  You do not urinate at least once every 8 hours.  You develop cold or clammy skin.  You continue to vomit for longer than 24 to 48 hours.  You have a fever. MAKE SURE YOU:   Understand these instructions.  Will watch your condition.  Will get help right away if you are not doing well or get worse.   This information is not intended to replace advice given to you by your health care provider. Make sure  you discuss any questions you have with your health care provider. °  °Document Released: 01/05/2005 Document Revised: 03/30/2011 Document Reviewed: 06/04/2010 °Elsevier Interactive Patient Education ©2016 [employer] ° °Abdominal Pain, Adult °Many  things can cause abdominal pain. Usually, abdominal pain is not caused by a disease and will improve without treatment. It can often be observed and treated at home. Your health care provider will do a physical exam and possibly order blood tests and X-rays to help determine the seriousness of your pain. However, in many cases, more time must pass before a clear cause of the pain can be found. Before that point, your health care provider may not know if you need more testing or further treatment. °HOME CARE INSTRUCTIONS °Monitor your abdominal pain for any changes. The following actions may help to alleviate any discomfort you are experiencing: °· Only take over-the-counter or prescription medicines as directed by your health care provider. °· Do not take laxatives unless directed to do so by your health care provider. °· Try a clear liquid diet (broth, tea, or water) as directed by your health care provider. Slowly move to a bland diet as tolerated. °SEEK MEDICAL CARE IF: °· You have unexplained abdominal pain. °· You have abdominal pain associated with nausea or diarrhea. °· You have pain when you urinate or have a bowel movement. °· You experience abdominal pain that wakes you in the night. °· You have abdominal pain that is worsened or improved by eating food. °· You have abdominal pain that is worsened with eating fatty foods. °· You have a fever. °SEEK IMMEDIATE MEDICAL CARE IF: °· Your pain does not go away within 2 hours. °· You keep throwing up (vomiting). °· Your pain is felt only in portions of the abdomen, such as the right side or the left lower portion of the abdomen. °· You pass bloody or black tarry stools. °MAKE SURE YOU: °· Understand these instructions. °· Will watch your condition. °· Will get help right away if you are not doing well or get worse. °  °This information is not intended to replace advice given to you by your health care provider. Make sure you discuss any questions you have with  your health care provider. °  °Document Released: 10/15/2004 Document Revised: 09/26/2014 Document Reviewed: 09/14/2012 °Elsevier Interactive Patient Education ©2016 [employer] ° °

## 2014-12-22 NOTE — ED Provider Notes (Signed)
CSN: [id number]     Arrival date & time 12/22/14  1613 History   First MD Initiated Contact with Patient 12/22/14 1714     Chief Complaint  Patient presents with  . Emesis   HPI  Tracey Daniel is a 34 year old female with PMHx of migraines, marijuana use, chronic abdominal pain, chronic nausea and vomiting and gastroparesis presenting with abdominal pain and emesis. Patient was seen in the emergency department for the same complaints 2 days ago but reports she has not been able to fill her Zofran prescription due to insurance issues. She states that the epigastric pain and nausea has persisted since her last discharge. She denies a change in her symptoms. Her abdominal pain is epigastric and cramping. The pain has been constant for approximately 3 days. It does not radiate. It is associated with constant nausea and daily episodes of NBNB vomiting. She does report daily marijuana use. Surgical history of cholecystectomy. Denies fevers, chills, dizziness, headache, chest pain, SOB or dysuria.   Past Medical History  Diagnosis Date  . Migraines   . Osteoporosis   . Seasonal allergies   . Sinusitis   . Back pain, chronic   . Nicotine addiction   . Constipation   . Vitamin D deficiency   . Gastritis   . Gastroparesis   . Pyloric spasm 03/30/2011  . Gastric outlet obstruction   . COPD (chronic obstructive pulmonary disease) ([place])   . Asthma   . Substance abuse 2008    marijuana  . Peptic ulcer disease   . Depression 2000    h/o suicidal ideation  . Anemia of other chronic disease 11/29/2012  . Chronic abdominal pain   . Nausea and vomiting     recurrent  . Cyst of skin     mid spine area   Past Surgical History  Procedure Laterality Date  . Cholecystectomy      ?2002  . Laser surgery on cervix    . Carpal tunnel release      left hand  . Esophagogastroduodenoscopy  12/25/2010    Dorothyann Peng, MD;  moderate gastritis, ?goo secondary to pylorspasm. BX showed reactive gstropathy  no h.pyori, SB mucosa with intramucosal lymphocytosis and partial villous blunting (TTG 4.0 normal)  . Esophageal dilation  12/25/2010    Procedure: ESOPHAGEAL DILATION;  Surgeon: Dorothyann Peng, MD;  Location: [facility];  Service: Endoscopy;;  . Esophagogastroduodenoscopy N/A 11/14/2012    [id number] hiatal hernia. Abnormal gastric mucosa of  uncertain significance-status post biopsy. Subjectively, patient may have recurrent symptomatic, pylorospasm.  . Esophagogastroduodenoscopy (egd) with propofol N/A 02/09/2013    elongated stomach, partial lower esophageal ring widely patent. No obvious pyloric stenosis s/p Botox  . Botox injection N/A 02/09/2013    Procedure: BOTOX INJECTION;  Surgeon: Missy Sabins, MD;  Location: [facility];  Service: Endoscopy;  Laterality: N/A;  possible balloon  . Balloon dilation N/A 02/09/2013    Procedure: BALLOON DILATION;  Surgeon: Missy Sabins, MD;  Location: [facility];  Service: Endoscopy;  Laterality: N/A;  . Colonoscopy with propofol N/A 09/20/2014    RMR: Normal ileo-colonoscopy   Family History  Problem Relation Age of Onset  . Diabetes Father   . Liver disease Father     liver transplant at [facility], age 59  . Colon cancer Neg Hx   . Lung cancer Mother 58  . Heart disease Maternal Grandmother   . Parkinson's disease Maternal Grandfather   . Multiple sclerosis Sister  68  . Depression Sister 16    bipolar and schizophrenic  . Alcohol abuse Sister    Social History  Substance Use Topics  . Smoking status: Current Every Day Smoker -- 0.10 packs/day for 15 years    Types: Cigarettes  . Smokeless tobacco: Never Used     Comment: smokes two  cigarette daily  . Alcohol Use: No     Comment: none since July 2016. Periodic heavy drinker for months at a time.   OB History    No data available     Review of Systems  Gastrointestinal: Positive for nausea, vomiting and abdominal pain.  All other systems reviewed and are negative.     Allergies   Benadryl; Latex; and Penicillins  Home Medications   Prior to Admission medications   Medication Sig Start Date End Date Taking? Authorizing Provider  albuterol (PROVENTIL HFA;VENTOLIN HFA) 108 (90 BASE) MCG/ACT inhaler Inhale 2 puffs into the lungs every 6 (six) hours as needed. Shortness of breath Patient taking differently: Inhale 2 puffs into the lungs every 6 (six) hours as needed for wheezing or shortness of breath. Shortness of breath 11/14/12 05/27/15 Yes [name], MD  budesonide-formoterol [facility]) 80-4.5 MCG/ACT inhaler Inhale 2 puffs into the lungs 2 (two) times daily. Patient taking differently: Inhale 2 puffs into the lungs every evening.  11/14/12 05/27/15 Yes [name], MD  calcium-vitamin D (OSCAL WITH D) 500-200 MG-UNIT per tablet Take 1 tablet by mouth 2 (two) times daily.   Yes Historical Provider, MD  clobetasol cream (TEMOVATE) [id number] % Apply 1 application topically 2 (two) times daily as needed (rash). Apply to rash   Yes Historical Provider, MD  DULoxetine (CYMBALTA) 30 MG capsule Take 1 capsule (30 mg total) by mouth daily. 05/16/14  Yes [name], MD  ergocalciferol (VITAMIN D2) 50000 UNITS capsule Take 1 capsule (50,000 Units total) by mouth once a week. One capsule once weekly 05/16/14  Yes [name], MD  estradiol (ESTRACE VAGINAL) 0.1 MG/GM vaginal cream Place 2 g vaginally at bedtime. As directed   Yes Historical Provider, MD  feeding supplement (ENSURE CLINICAL STRENGTH) LIQD Take 237 mLs by mouth 3 (three) times daily with meals. Patient taking differently: Take 237 mLs by mouth 2 (two) times daily.  01/01/11  Yes Nishant Dhungel, MD  gabapentin (NEURONTIN) 100 MG capsule Take 100 mg by mouth 3 (three) times daily.   Yes Historical Provider, MD  Linaclotide (LINZESS) 290 MCG CAPS capsule Take 1 capsule (290 mcg total) by mouth daily. 08/27/14  Yes Mahala Menghini, PA-C  loratadine (CLARITIN) 10 MG tablet Take 1 tablet (10 mg total) by  mouth daily. 10/09/11  Yes [name], MD  mometasone (NASONEX) 50 MCG/ACT nasal spray Place 2 sprays into the nose 2 (two) times daily. 05/16/14  Yes [name], MD  NUCYNTA 50 MG TABS tablet Take 50 mg by mouth every 8 (eight) hours as needed for moderate pain.  08/27/14  Yes Historical Provider, MD  omeprazole (PRILOSEC) 40 MG capsule Take 1 capsule (40 mg total) by mouth 2 (two) times daily before a meal. 11/20/14  Yes Mahala Menghini, PA-C  ondansetron (ZOFRAN ODT) 8 MG disintegrating tablet Take 1 tablet (8 mg total) by mouth every 8 (eight) hours as needed for nausea or vomiting. 12/20/14  Yes Lacretia Leigh, MD  ondansetron (ZOFRAN) 4 MG tablet TAKE 1 TABLET(4 MG) BY MOUTH EVERY 6 HOURS AS NEEDED FOR NAUSEA OR VOMITING 11/30/14  Yes Orvil Feil, NP  simvastatin (ZOCOR) 20 MG tablet Take 20 mg by mouth daily.   Yes Historical Provider, MD  sucralfate (CARAFATE) 1 G tablet Crush one tablet and mix in applesauce, take every morning and at bedtime as needed for stomach burning. 08/27/14  Yes Mahala Menghini, PA-C  tiZANidine (ZANAFLEX) 4 MG tablet Take 4 mg by mouth every 6 (six) hours as needed for muscle spasms.  10/27/13  Yes Historical Provider, MD  calcium-vitamin D (OSCAL 500/200 D-3) 500-200 MG-UNIT per tablet Take 1 tablet by mouth 2 (two) times daily. Patient not taking: Reported on 12/22/2014 11/14/12 11/08/14  [name], MD  dexlansoprazole (DEXILANT) 60 MG capsule Take 1 capsule (60 mg total) by mouth daily. Patient not taking: Reported on 12/06/2014 11/30/14   Orvil Feil, NP  promethazine (PHENERGAN) 25 MG tablet Take 1 tablet (25 mg total) by mouth every 6 (six) hours as needed for nausea or vomiting. 12/22/14   Izyk Marty, PA-C   BP 131/82 mmHg  Pulse 77  Temp(Src) 98.4 F (36.9 C) (Oral)  Resp 17  SpO2 100%  LMP 01/19/1997 Physical Exam  Constitutional: She is oriented to person, place, and time. She appears well-developed and well-nourished. No distress.   HENT:  Head: Normocephalic and atraumatic.  Mouth/Throat: Oropharynx is clear and moist.  Eyes: Conjunctivae and EOM are normal. Right eye exhibits no discharge. Left eye exhibits no discharge. No scleral icterus.  Neck: Normal range of motion.  Cardiovascular: Normal rate, regular rhythm and normal heart sounds.   Pulmonary/Chest: Effort normal and breath sounds normal. No respiratory distress. She has no wheezes. She has no rales.  Abdominal: Soft. She exhibits no distension. There is tenderness in the epigastric area. There is no rigidity, no rebound and no guarding.  Abdomen is soft with epigastric tenderness.   Musculoskeletal: Normal range of motion. She exhibits no edema or tenderness.  Neurological: She is alert and oriented to person, place, and time. Coordination normal.  Skin: Skin is warm and dry.  Psychiatric: She has a normal mood and affect. Her behavior is normal.  Nursing note and vitals reviewed.   ED Course  Procedures (including critical care time) Labs Review Labs Reviewed  CBC WITH DIFFERENTIAL/PLATELET - Abnormal; Notable for the following:    WBC 12.5 (*)    Neutro Abs 9.0 (*)    Monocytes Absolute 1.1 (*)    All other components within normal limits  COMPREHENSIVE METABOLIC PANEL - Abnormal; Notable for the following:    Potassium 3.4 (*)    Chloride 98 (*)    Total Protein 8.3 (*)    All other components within normal limits  URINALYSIS, ROUTINE W REFLEX MICROSCOPIC (NOT AT [facility]) - Abnormal; Notable for the following:    Color, Urine AMBER (*)    APPearance CLOUDY (*)    Bilirubin Urine SMALL (*)    Ketones, ur >80 (*)    Protein, ur 30 (*)    All other components within normal limits  URINE MICROSCOPIC-ADD ON - Abnormal; Notable for the following:    Squamous Epithelial / LPF 0-5 (*)    Bacteria, UA FEW (*)    All other components within normal limits  LIPASE, BLOOD    Imaging Review No results found. I have personally reviewed and evaluated  these images and lab results as part of my medical decision-making.   EKG Interpretation None      MDM   Final diagnoses:  Epigastric pain  Nausea and vomiting, vomiting of unspecified type   Patient presenting with persistent nausea, vomiting and abdominal pain since discharge from ED two days ago with same complaints. She was unable to fill her zofran prescription due to insurance issues. She reports no change in symptoms. VSS. Patient is nontoxic, nonseptic appearing, in no apparent distress.  Patient's pain and other symptoms adequately managed in emergency department. Fluid bolus given.  Labs, imaging and vitals reviewed. Pt has unchanged lab work when compared to 2 days prior. Abd CT from 2 days ago was without abnormality. No re-imaging is indicated due to no change in symptoms. Cyclic vomiting from marijuana likely source of her symptoms. Patient does not meet the SIRS or Sepsis criteria.  On repeat exam patient does not have a surgical abdomen and there are no peritoneal signs.  Patient discharged home with symptomatic treatment and given strict instructions for follow-up with their primary care physician.  I have also discussed reasons to return immediately to the ER.  Patient expresses understanding and agrees with plan.      Tracey Crocker Carnetta Losada, PA-C 12/22/14 2221  Lacretia Leigh, MD 12/22/14 [phone]

## 2014-12-22 NOTE — ED Notes (Signed)
RN Rica Mote starting and IV

## 2014-12-22 NOTE — ED Notes (Signed)
Patient has had N/V and diffuse abdominal pain x2 days.  Patient was seen here for this and given an Rx for Zofran ODT.  Her insurance will not cover that prescription.  Patient endorses occasional use of marijuana, but states she has not smoked in several days.

## 2014-12-25 ENCOUNTER — Other Ambulatory Visit (HOSPITAL_COMMUNITY): Payer: Self-pay

## 2014-12-25 DIAGNOSIS — D649 Anemia, unspecified: Secondary | ICD-10-CM

## 2014-12-30 NOTE — Progress Notes (Signed)
Tracey Nakayama, MD [address], [address] [place] [address]  Anemia, unspecified anemia type - Plan: pantoprazole (PROTONIX) 40 MG tablet, CBC with Differential, Ferritin, Iron and TIBC  CURRENT THERAPY: PO iron on hold x 4 weeks due to persistent N/V  INTERVAL HISTORY: Tracey Daniel 34 y.o. female returns for followup of intermittent anemia, last noted in Feb 2016, on oral replacement.  She returns with short-interval follow-up due to persistent nausea with vomiting requiring a trial of holding PO iron replacement to verify if this treatment is contributing to her symptoms.  On her last encounter, she reported: She reports chronic nausea with daily vomiting.  GI is following closely with medication management.  She admits to compliance with PO ferrous sulfate.  She notes that her N/V has been ongoing for 2 years or so.  She reports to me that she has been taking ferrous sulfate x 4 years or so.  Given her symptoms without any noted improvement, we owe it to the patient to verify that her N/V is not ferrous sulfate induced.  She reported to the ED about 2 weeks ago for nausea with vomiting.  She tested positive on pregnancy testing.  She has follow-up with OB/GYN.  Her levels are very low.  I will defer confirmation to OB/GYN.  She admits that her preganancy is a big surprise given her previous diagnosis of premature menopause at a very young age.    In the interim, she started pre-natal vitamins on her own.  I suspect this has some iron in it.  She is to continue with this until otherwise directed by her gynecologist.  She has not had a menstrual cycle in years.  Past Medical History  Diagnosis Date  . Migraines   . Osteoporosis   . Seasonal allergies   . Sinusitis   . Back pain, chronic   . Nicotine addiction   . Constipation   . Vitamin D deficiency   . Gastritis   . Gastroparesis   . Pyloric spasm 03/30/2011  . Gastric outlet obstruction   . COPD  (chronic obstructive pulmonary disease) ([place])   . Asthma   . Substance abuse 2008    marijuana  . Peptic ulcer disease   . Depression 2000    h/o suicidal ideation  . Anemia of other chronic disease 11/29/2012  . Chronic abdominal pain   . Nausea and vomiting     recurrent  . 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     is allergic to benadryl; latex; and penicillins.  Current Outpatient Prescriptions on File Prior to Visit  Medication Sig Dispense Refill  . albuterol (PROVENTIL HFA;VENTOLIN HFA) 108 (90 BASE) MCG/ACT inhaler Inhale 2 puffs into the lungs every 6 (six) hours as needed. Shortness of breath (Patient taking differently: Inhale 2 puffs into the lungs every 6 (six) hours as needed for wheezing or shortness  of breath. Shortness of breath) 6.7 g 4  . budesonide-formoterol (SYMBICORT) 80-4.5 MCG/ACT inhaler Inhale 2 puffs into the lungs 2 (two) times daily. (Patient taking differently: Inhale 2 puffs into the lungs every evening. ) 1 Inhaler 4  . calcium-vitamin D (OSCAL WITH D) 500-200 MG-UNIT per tablet Take 1 tablet by mouth 2 (two) times daily.    . clobetasol cream (TEMOVATE) [id number] % Apply 1 application topically 2 (two) times  daily as needed (rash). Apply to rash    . dexlansoprazole (DEXILANT) 60 MG capsule Take 1 capsule (60 mg total) by mouth daily. 90 capsule 0  . ergocalciferol (VITAMIN D2) 50000 UNITS capsule Take 1 capsule (50,000 Units total) by mouth once a week. One capsule once weekly 12 capsule 1  . estradiol (ESTRACE VAGINAL) 0.1 MG/GM vaginal cream Place 2 g vaginally at bedtime. As directed    . feeding supplement (ENSURE CLINICAL STRENGTH) LIQD Take 237 mLs by mouth 3 (three) times daily with meals. (Patient taking differently: Take 237 mLs by mouth 2 (two) times daily. ) 10 Bottle 3  . gabapentin (NEURONTIN) 100 MG capsule Take 100 mg by mouth 3 (three) times daily.    Marland Kitchen loratadine (CLARITIN) 10 MG tablet Take 1 tablet (10 mg total) by mouth daily. 30 tablet 5  . mometasone (NASONEX) 50 MCG/ACT nasal spray Place 2 sprays into the nose 2 (two) times daily. 17 g 12  . omeprazole (PRILOSEC) 40 MG capsule Take 1 capsule (40 mg total) by mouth 2 (two) times daily before a meal. 60 capsule 3  . ondansetron (ZOFRAN ODT) 8 MG disintegrating tablet Take 1 tablet (8 mg total) by mouth every 8 (eight) hours as needed for nausea or vomiting. 20 tablet 0  . ondansetron (ZOFRAN) 4 MG tablet TAKE 1 TABLET(4 MG) BY MOUTH EVERY 6 HOURS AS NEEDED FOR NAUSEA OR VOMITING 30 tablet 1  . promethazine (PHENERGAN) 25 MG tablet Take 1 tablet (25 mg total) by mouth every 6 (six) hours as needed for nausea or vomiting. 20 tablet 0  . simvastatin (ZOCOR) 20 MG tablet Take 20 mg by mouth daily.    . sucralfate (CARAFATE) 1 G tablet Crush one tablet and mix in applesauce, take every morning and at bedtime as needed for stomach burning. 60 tablet 3  . tiZANidine (ZANAFLEX) 4 MG tablet Take 4 mg by mouth every 6 (six) hours as needed for muscle spasms.     . DULoxetine (CYMBALTA) 30 MG capsule Take 1 capsule (30 mg total) by mouth daily. 30 capsule 3  . Linaclotide (LINZESS) 290 MCG CAPS capsule Take 1 capsule (290 mcg total) by mouth  daily. (Patient not taking: Reported on 12/31/2014) 30 capsule 5  . NUCYNTA 50 MG TABS tablet Take 50 mg by mouth every 8 (eight) hours as needed for moderate pain.   0   No current facility-administered medications on file prior to visit.    Past Surgical History  Procedure Laterality Date  . Cholecystectomy      ?2002  . Laser surgery on cervix    . Carpal tunnel release      left hand  . Esophagogastroduodenoscopy  12/25/2010    Dorothyann Peng, MD;  moderate gastritis, ?goo secondary to pylorspasm. BX showed reactive gstropathy no h.pyori, SB mucosa with intramucosal lymphocytosis and partial villous blunting (TTG 4.0 normal)  . Esophageal dilation  12/25/2010    Procedure: ESOPHAGEAL DILATION;  Surgeon: Dorothyann Peng, MD;  [facility];  Service:  Endoscopy;;  . Esophagogastroduodenoscopy N/A 11/14/2012    [id number] hiatal hernia. Abnormal gastric mucosa of  uncertain significance-status post biopsy. Subjectively, patient may have recurrent symptomatic, pylorospasm.  . Esophagogastroduodenoscopy (egd) with propofol N/A 02/09/2013    elongated stomach, partial lower esophageal ring widely patent. No obvious pyloric stenosis s/p Botox  . Botox injection N/A 02/09/2013    Procedure: BOTOX INJECTION;  Surgeon: Missy Sabins, MD;  Location: [facility];  Service: Endoscopy;  Laterality: N/A;  possible balloon  . Balloon dilation N/A 02/09/2013    Procedure: BALLOON DILATION;  Surgeon: Missy Sabins, MD;  Location: [facility];  Service: Endoscopy;  Laterality: N/A;  . Colonoscopy with propofol N/A 09/20/2014    RMR: Normal ileo-colonoscopy    Denies any headaches, dizziness, double vision, fevers, chills, night sweats, diarrhea, constipation, chest pain, heart palpitations, shortness of breath, blood in stool, black tarry stool, urinary pain, urinary burning, urinary frequency, hematuria.   PHYSICAL EXAMINATION  ECOG PERFORMANCE STATUS: 1 - Symptomatic but completely  ambulatory  Filed Vitals:   12/31/14 1200  BP: 102/85  Pulse: 64  Temp: 98.4 F (36.9 C)  Resp: 18    GENERAL:alert, no distress, well nourished, cooperative, smiling and unaccompanied, excited and smiling with news of pregnancy. SKIN: skin color, texture, turgor are normal, no rashes or significant lesions HEAD: Normocephalic, No masses, lesions, tenderness or abnormalities EYES: normal, PERRLA, EOMI, Conjunctiva are pink and non-injected EARS: External ears normal OROPHARYNX:lips, buccal mucosa, and tongue normal and mucous membranes are moist  NECK: supple, no adenopathy, thyroid normal size, non-tender, without nodularity, no stridor, non-tender, trachea midline LYMPH:  no palpable lymphadenopathy BREAST:not examined LUNGS: clear to auscultation  HEART: regular rate & rhythm, no murmurs, no gallops, S1 normal and S2 normal ABDOMEN:abdomen soft, non-tender and normal bowel sounds BACK: Back symmetric, no curvature. EXTREMITIES:less then 2 second capillary refill, no joint deformities, effusion, or inflammation, no skin discoloration, no cyanosis  NEURO: alert & oriented x 3 with fluent speech, no focal motor/sensory deficits, gait normal   LABORATORY DATA: CBC    Component Value Date/Time   WBC 7.9 12/31/2014 1247   RBC 3.86* 12/31/2014 1247   HGB 11.6* 12/31/2014 1247   HCT 35.4* 12/31/2014 1247   PLT 216 12/31/2014 1247   MCV 91.7 12/31/2014 1247   MCH 30.1 12/31/2014 1247   MCHC 32.8 12/31/2014 1247   RDW 13.6 12/31/2014 1247   LYMPHSABS 2.4 12/22/2014 1829   MONOABS 1.1* 12/22/2014 1829   EOSABS 0.0 12/22/2014 1829   BASOSABS 0.0 12/22/2014 1829      Chemistry      Component Value Date/Time   NA 139 12/22/2014 1829   K 3.4* 12/22/2014 1829   CL 98* 12/22/2014 1829   CO2 28 12/22/2014 1829   BUN 17 12/22/2014 1829   CREATININE 0.86 12/22/2014 1829   CREATININE 0.72 05/02/2014 0733      Component Value Date/Time   CALCIUM 10.2 12/22/2014 1829   ALKPHOS  67 12/22/2014 1829   AST 24 12/22/2014 1829   ALT 18 12/22/2014 1829   BILITOT 0.9 12/22/2014 1829     Lab Results  Component Value Date   IRON 44 12/01/2013   TIBC 336 12/01/2013   FERRITIN 63 12/06/2014      PENDING LABS:   RADIOGRAPHIC STUDIES:  Ct Abdomen Pelvis W Contrast  12/20/2014  CLINICAL DATA:  Abdominal pain and vomiting, onset at 11:00. EXAM: CT ABDOMEN AND PELVIS WITH CONTRAST TECHNIQUE: Multidetector CT imaging of the abdomen  and pelvis was performed using the standard protocol following bolus administration of intravenous contrast. CONTRAST:  1 OMNIPAQUE IOHEXOL 300 MG/ML  SOLN COMPARISON:  03/29/2011 FINDINGS: Lower chest: There is minimal streaky opacity in the medial left lung base which could be early infiltrate or atelectasis. Right lung bases clear. No effusions. Hepatobiliary: Prior cholecystectomy.  Normal liver and bile ducts. Pancreas: Normal Spleen: Normal Adrenals/Urinary Tract: The adrenals and kidneys are normal in appearance. There is no urinary calculus evident. There is no hydronephrosis or ureteral dilatation. Collecting systems and ureters appear unremarkable. Stomach/Bowel: There are normal appearances of the stomach, small bowel and colon. The appendix is normal. Vascular/Lymphatic: The abdominal aorta is normal in caliber. There is no atherosclerotic calcification. There is no adenopathy in the abdomen or pelvis. Reproductive: Unremarkable. No adnexal masses or abnormal fluid collections. Other: No acute inflammatory changes are evident in the abdomen or pelvis. There is no ascites. Musculoskeletal: No significant abnormality. IMPRESSION: No significant abnormality in the abdomen or pelvis. There is minimal streaky opacity in the medial left lung base, new from 03/29/2011. This could represent early infiltrate or atelectasis. Electronically Signed   By: Andreas Newport M.D.   On: 12/20/2014 21:25     PATHOLOGY:    ASSESSMENT AND PLAN:   Anemia Anemia, on PO iron replacement with ferrous sulfate.  Last documented incidence of anemia was Feb 2016 and looking through her [facility] chart, she only has intermittent anemia.  She was seen 4 weeks ago for her annual follow-up and noted persistent nausea with vomiting.  As a result, ferrous sulfate was placed on hold for 3-4 weeks to see if this medication is contributing to her symptoms.    She was found 2 weeks ago to have positive pregnancy test in the ED when she presented with nausea/vomiting.  Hold PO ferrous sulfate.  She has started pre-natal vitamins which have iron in them.  She has follow-up with gynecology upcoming.  Labs in 4-6 weeks.  Return in 4-6 weeks for follow-up.   THERAPY PLAN:  Continue to hold ferrous sulfate.  She is welcome to take pre-natal vitamin and I will defer pregnancy work-up and management to OB/GYN.  All questions were answered. The patient knows to call the clinic with any problems, questions or concerns. We can certainly see the patient much sooner if necessary.  Patient and plan discussed with Dr. Ancil Linsey and she is in agreement with the aforementioned.   This note is electronically signed by: Doy Mince 12/31/2014 6:02 PM

## 2014-12-30 NOTE — Assessment & Plan Note (Addendum)
Anemia, on PO iron replacement with ferrous sulfate.  Last documented incidence of anemia was Feb 2016 and looking through her [facility] chart, she only has intermittent anemia.  She was seen 4 weeks ago for her annual follow-up and noted persistent nausea with vomiting.  As a result, ferrous sulfate was placed on hold for 3-4 weeks to see if this medication is contributing to her symptoms.    She was found 2 weeks ago to have positive pregnancy test in the ED when she presented with nausea/vomiting.  Hold PO ferrous sulfate.  She has started pre-natal vitamins which have iron in them.  She has follow-up with gynecology upcoming.  Labs in 4-6 weeks.  Return in 4-6 weeks for follow-up.

## 2014-12-31 ENCOUNTER — Encounter (HOSPITAL_COMMUNITY): Payer: Self-pay | Admitting: Oncology

## 2014-12-31 ENCOUNTER — Encounter (HOSPITAL_COMMUNITY): Payer: Medicare Other | Attending: Oncology | Admitting: Oncology

## 2014-12-31 ENCOUNTER — Encounter (HOSPITAL_COMMUNITY): Payer: Medicare Other

## 2014-12-31 VITALS — BP 102/85 | HR 64 | Temp 98.4°F | Resp 18 | Wt 138.6 lb

## 2014-12-31 DIAGNOSIS — D649 Anemia, unspecified: Secondary | ICD-10-CM

## 2014-12-31 LAB — CBC
HCT: 35.4 % — ABNORMAL LOW (ref 36.0–46.0)
Hemoglobin: 11.6 g/dL — ABNORMAL LOW (ref 12.0–15.0)
MCH: 30.1 pg (ref 26.0–34.0)
MCHC: 32.8 g/dL (ref 30.0–36.0)
MCV: 91.7 fL (ref 78.0–100.0)
PLATELETS: 216 10*3/uL (ref 150–400)
RBC: 3.86 MIL/uL — AB (ref 3.87–5.11)
RDW: 13.6 % (ref 11.5–15.5)
WBC: 7.9 10*3/uL (ref 4.0–10.5)

## 2014-12-31 LAB — FERRITIN: FERRITIN: 60 ng/mL (ref 11–307)

## 2014-12-31 NOTE — Patient Instructions (Addendum)
[place] at [facility] Discharge Instructions  RECOMMENDATIONS MADE BY THE CONSULTANT AND ANY TEST RESULTS WILL BE SENT TO YOUR REFERRING PHYSICIAN.   Exam completed by Kirby Crigler PA Labs 4-6 weeks Stop your oral iron Take pre-natal vitamins and the ones with iron are ok Return to see the doctor in 4-6 weeks Please call the clinic if you have any questions or concerns    Thank you for choosing [place] at [facility] to provide your oncology and hematology care.  To afford each patient quality time with our provider, please arrive at least 15 minutes before your scheduled appointment time.    You need to re-schedule your appointment should you arrive 10 or more minutes late.  We strive to give you quality time with our providers, and arriving late affects you and other patients whose appointments are after yours.  Also, if you no show three or more times for appointments you may be dismissed from the clinic at the providers discretion.     Again, thank you for choosing [facility].  Our hope is that these requests will decrease the amount of time that you wait before being seen by our physicians.       _____________________________________________________________  Should you have questions after your visit to [facility], please contact our office at [phone] between the hours of 8:30 a.m. and 4:30 p.m.  Voicemails left after 4:30 p.m. will not be returned until the following business day.  For prescription refill requests, have your pharmacy contact our office.

## 2015-01-08 DIAGNOSIS — M47816 Spondylosis without myelopathy or radiculopathy, lumbar region: Secondary | ICD-10-CM | POA: Diagnosis not present

## 2015-01-08 DIAGNOSIS — M4696 Unspecified inflammatory spondylopathy, lumbar region: Secondary | ICD-10-CM | POA: Diagnosis not present

## 2015-01-08 DIAGNOSIS — M7138 Other bursal cyst, other site: Secondary | ICD-10-CM | POA: Diagnosis not present

## 2015-01-14 ENCOUNTER — Other Ambulatory Visit: Payer: Self-pay | Admitting: Gastroenterology

## 2015-02-01 ENCOUNTER — Other Ambulatory Visit: Payer: Self-pay | Admitting: Obstetrics & Gynecology

## 2015-02-01 DIAGNOSIS — O3680X0 Pregnancy with inconclusive fetal viability, not applicable or unspecified: Secondary | ICD-10-CM

## 2015-02-04 ENCOUNTER — Other Ambulatory Visit: Payer: Self-pay | Admitting: Obstetrics & Gynecology

## 2015-02-04 ENCOUNTER — Ambulatory Visit (INDEPENDENT_AMBULATORY_CARE_PROVIDER_SITE_OTHER): Payer: Medicare Other | Admitting: Obstetrics and Gynecology

## 2015-02-04 ENCOUNTER — Ambulatory Visit (INDEPENDENT_AMBULATORY_CARE_PROVIDER_SITE_OTHER): Payer: Medicare Other

## 2015-02-04 ENCOUNTER — Encounter: Payer: Self-pay | Admitting: Obstetrics and Gynecology

## 2015-02-04 VITALS — BP 110/80 | HR 78 | Ht 66.0 in | Wt 140.0 lb

## 2015-02-04 DIAGNOSIS — E2839 Other primary ovarian failure: Secondary | ICD-10-CM | POA: Insufficient documentation

## 2015-02-04 DIAGNOSIS — O3680X0 Pregnancy with inconclusive fetal viability, not applicable or unspecified: Secondary | ICD-10-CM

## 2015-02-04 DIAGNOSIS — E288 Other ovarian dysfunction: Secondary | ICD-10-CM

## 2015-02-04 DIAGNOSIS — N912 Amenorrhea, unspecified: Secondary | ICD-10-CM

## 2015-02-04 DIAGNOSIS — Z331 Pregnant state, incidental: Secondary | ICD-10-CM | POA: Insufficient documentation

## 2015-02-04 NOTE — Progress Notes (Signed)
Patient ID: Tracey Daniel, female   DOB: 06/21/1980, 35 y.o.   MRN: [id number]   [place] [facility] Visit  Patient name: Tracey Daniel MRN [id number]  Date of birth: 1980-06-26  CC & HPI:  Tracey Daniel is a 35 y.o. female presenting today for followup of Low + HCG Of 7 noted at [facility] ED visit.  Sent to our office for initial dating u/s but there's no IUP.  ROS:  Pt with hx amenorrhea and PREMATURE ov failure at age 45. Sexually active. Med review: pt on vaginal estrogen but no systemic estrogens. Pt is high risk of osteoporosis  Pertinent History Reviewed:   Reviewed: Significant for see below Medical         Past Medical History  Diagnosis Date  . Migraines   . Osteoporosis   . Seasonal allergies   . Sinusitis   . Back pain, chronic   . Nicotine addiction   . Constipation   . Vitamin D deficiency   . Gastritis   . Gastroparesis   . Pyloric spasm 03/30/2011  . Gastric outlet obstruction   . COPD (chronic obstructive pulmonary disease) ([place])   . Asthma   . Substance abuse 2008    marijuana  . Peptic ulcer disease   . Depression 2000    h/o suicidal ideation  . Anemia of other chronic disease 11/29/2012  . Chronic abdominal pain   . Nausea and vomiting     recurrent  . Cyst of skin     mid spine area                              Surgical Hx:    Past Surgical History  Procedure Laterality Date  . Cholecystectomy      ?2002  . Laser surgery on cervix    . Carpal tunnel release      left hand  . Esophagogastroduodenoscopy  12/25/2010    Dorothyann Peng, MD;  moderate gastritis, ?goo secondary to pylorspasm. BX showed reactive gstropathy no h.pyori, SB mucosa with intramucosal lymphocytosis and partial villous blunting (TTG 4.0 normal)  . Esophageal dilation  12/25/2010    Procedure: ESOPHAGEAL DILATION;  Surgeon: Dorothyann Peng, MD;  [facility];  Service: Endoscopy;;  . Esophagogastroduodenoscopy N/A 11/14/2012    [id number] hiatal  hernia. Abnormal gastric mucosa of  uncertain significance-status post biopsy. Subjectively, patient may have recurrent symptomatic, pylorospasm.  . Esophagogastroduodenoscopy (egd) with propofol N/A 02/09/2013    elongated stomach, partial lower esophageal ring widely patent. No obvious pyloric stenosis s/p Botox  . Botox injection N/A 02/09/2013    Procedure: BOTOX INJECTION;  Surgeon: Missy Sabins, MD;  [facility]: [facility];  Service: Endoscopy;  Laterality: N/A;  possible balloon  . Balloon dilation N/A 02/09/2013    Procedure: BALLOON DILATION;  Surgeon: Missy Sabins, MD;  [facility];  Service: Endoscopy;  Laterality: N/A;  . Colonoscopy with propofol N/A 09/20/2014    RMR: Normal ileo-colonoscopy   Medications: Reviewed & Updated - see associated section                       Current outpatient prescriptions:  .  budesonide-formoterol (SYMBICORT) 80-4.5 MCG/ACT inhaler, Inhale 2 puffs into the lungs 2 (two) times daily. (Patient taking differently: Inhale 2 puffs into the lungs every evening. ), Disp: 1 Inhaler, Rfl: 4 .  calcium-vitamin D (OSCAL  WITH D) 500-200 MG-UNIT per tablet, Take 1 tablet by mouth 2 (two) times daily., Disp: , Rfl:  .  clobetasol cream (TEMOVATE) [id number] %, Apply 1 application topically 2 (two) times daily as needed (rash). Apply to rash, Disp: , Rfl:  .  dexlansoprazole (DEXILANT) 60 MG capsule, Take 1 capsule (60 mg total) by mouth daily., Disp: 90 capsule, Rfl: 0 .  DULoxetine (CYMBALTA) 30 MG capsule, Take 1 capsule (30 mg total) by mouth daily., Disp: 30 capsule, Rfl: 3 .  ergocalciferol (VITAMIN D2) 50000 UNITS capsule, Take 1 capsule (50,000 Units total) by mouth once a week. One capsule once weekly, Disp: 12 capsule, Rfl: 1 .  estradiol (ESTRACE VAGINAL) 0.1 MG/GM vaginal cream, Place 2 g vaginally at bedtime. As directed, Disp: , Rfl:  .  feeding supplement (ENSURE CLINICAL STRENGTH) LIQD, Take 237 mLs by mouth 3 (three) times daily with meals.  (Patient taking differently: Take 237 mLs by mouth 2 (two) times daily. ), Disp: 10 Bottle, Rfl: 3 .  gabapentin (NEURONTIN) 100 MG capsule, Take 100 mg by mouth 3 (three) times daily., Disp: , Rfl:  .  HYDROcodone-acetaminophen (NORCO) 7.5-325 MG tablet, 1 tablet every 4 (four) hours as needed., Disp: , Rfl:  .  Linaclotide (LINZESS) 290 MCG CAPS capsule, Take 1 capsule (290 mcg total) by mouth daily., Disp: 30 capsule, Rfl: 5 .  loratadine (CLARITIN) 10 MG tablet, Take 1 tablet (10 mg total) by mouth daily., Disp: 30 tablet, Rfl: 5 .  mometasone (NASONEX) 50 MCG/ACT nasal spray, Place 2 sprays into the nose 2 (two) times daily., Disp: 17 g, Rfl: 12 .  NUCYNTA 50 MG TABS tablet, Take 50 mg by mouth every 8 (eight) hours as needed for moderate pain. , Disp: , Rfl: 0 .  omeprazole (PRILOSEC) 40 MG capsule, Take 1 capsule (40 mg total) by mouth 2 (two) times daily before a meal., Disp: 60 capsule, Rfl: 3 .  ondansetron (ZOFRAN) 4 MG tablet, TAKE 1 TABLET(4 MG) BY MOUTH EVERY 6 HOURS AS NEEDED FOR NAUSEA OR VOMITING, Disp: 30 tablet, Rfl: 1 .  pantoprazole (PROTONIX) 40 MG tablet, Take 40 mg by mouth 2 (two) times daily., Disp: , Rfl:  .  sucralfate (CARAFATE) 1 G tablet, Crush one tablet and mix in applesauce, take every morning and at bedtime as needed for stomach burning., Disp: 60 tablet, Rfl: 3 .  tiZANidine (ZANAFLEX) 4 MG tablet, Take 4 mg by mouth every 6 (six) hours as needed for muscle spasms. , Disp: , Rfl:  .  albuterol (PROVENTIL HFA;VENTOLIN HFA) 108 (90 BASE) MCG/ACT inhaler, Inhale 2 puffs into the lungs every 6 (six) hours as needed. Shortness of breath (Patient taking differently: Inhale 2 puffs into the lungs every 6 (six) hours as needed for wheezing or shortness of breath. Shortness of breath), Disp: 6.7 g, Rfl: 4 .  promethazine (PHENERGAN) 25 MG tablet, Take 1 tablet (25 mg total) by mouth every 6 (six) hours as needed for nausea or vomiting., Disp: 20 tablet, Rfl: 0 .  simvastatin  (ZOCOR) 20 MG tablet, Take 20 mg by mouth daily. Reported on 02/04/2015, Disp: , Rfl:    Social History: Reviewed -  reports that she has been smoking Cigarettes.  She has a 1.5 pack-year smoking history. She has never used smokeless tobacco.  Objective Findings:  Vitals: Blood pressure 110/80, pulse 78, height 5\' 6"  (1.676 m), weight 140 lb (63.504 kg), unknown if currently breastfeeding.  Physical Examination: General appearance - alert, well  appearing, and in no distress, oriented to person, place, and time and normal appearing weight Mental status - alert, oriented to person, place, and time, normal mood, behavior, speech, dress, motor activity, and thought processes   Assessment & Plan:   A:  1. Probable CNS production of low level HCG 2. Premature ov failure POF 3  Pt candidate for systemic HT for POF  P:  1. Check quant hcg 2 Check FSH 3 TSH rejected by Medicaid/medicare Results 1 wk  I spent 25 minutes with the visit with >than 50% spent in counseling and coordination of care.

## 2015-02-04 NOTE — Progress Notes (Signed)
PELVIC US TA: small anteverted uterus w/ a small amount of fluid w/in the endometrium,EEC 1 mm,normal ov's bilat (limited view because of bowel gas),no free fluid,no pain during ultrasound

## 2015-02-05 LAB — FOLLICLE STIMULATING HORMONE: FSH: 49.4 m[IU]/mL

## 2015-02-06 ENCOUNTER — Other Ambulatory Visit (HOSPITAL_COMMUNITY): Payer: Medicare Other

## 2015-02-06 ENCOUNTER — Ambulatory Visit (HOSPITAL_COMMUNITY): Payer: Medicare Other | Admitting: Oncology

## 2015-02-11 ENCOUNTER — Ambulatory Visit (INDEPENDENT_AMBULATORY_CARE_PROVIDER_SITE_OTHER): Payer: Medicare Other | Admitting: Obstetrics and Gynecology

## 2015-02-11 ENCOUNTER — Encounter: Payer: Self-pay | Admitting: Obstetrics and Gynecology

## 2015-02-11 VITALS — BP 118/68 | Ht 66.0 in | Wt 140.0 lb

## 2015-02-11 DIAGNOSIS — E288 Other ovarian dysfunction: Secondary | ICD-10-CM

## 2015-02-11 DIAGNOSIS — Z331 Pregnant state, incidental: Secondary | ICD-10-CM

## 2015-02-11 DIAGNOSIS — E2839 Other primary ovarian failure: Secondary | ICD-10-CM

## 2015-02-11 MED ORDER — RALOXIFENE HCL 60 MG PO TABS: 60.0000 mg | ORAL_TABLET | Freq: Every day | ORAL | Status: DC

## 2015-02-12 NOTE — Assessment & Plan Note (Addendum)
Anemia, previously on PO iron replacement with ferrous sulfate with improved HGB but with persistent nausea.  PO iron placed on hold in November 2016 with improvement in nausea.  She notes improvement in her nausea since holding her PO iron.  It has not yet completely resolved, but it is much improved.  She reports a false positive pregnancy test.  She is not pregnant she reports.  She denies any known blood loss.  She admits to increased fatigue since our last visit, but I note that her HGB has dropped some.  If her ferritin is less than 100, I will calculate her iron deficit and administer IV iron accordingly.  I reviewed the risks, benefits, alternatives, and side effects of IV iron.  She is agreeable to this if needed.  Labs in 3 months: CBC diff, iron/TIBC, ferritin  Return in 3 months for follow-up.  If IV iron is needed, then I will add another lab appointment about 4-6 weeks out to evaluate her response to iron.

## 2015-02-12 NOTE — Progress Notes (Signed)
Tracey Nakayama, MD [address] [place] [address]  Anemia, unspecified anemia type  CURRENT THERAPY: PO iron on hold since 12/04/2014 due to persistent N/V  INTERVAL HISTORY: Tracey Daniel 35 y.o. female returns for followup of intermittent anemia, last noted in Feb 2016, on oral replacement.  She returns with short-interval follow-up due to persistent nausea with vomiting requiring a trial of holding PO iron replacement to verify if this treatment is contributing to her symptoms.  On her previous encounter in November 2016, she reported: She reports chronic nausea with daily vomiting.  GI is following closely with medication management.  She admits to compliance with PO ferrous sulfate.  She notes that her N/V has been ongoing for 2 years or so.  She reports to me that she has been taking ferrous sulfate x 4 years or so.  Given her symptoms without any noted improvement, we owe it to the patient to verify that her N/V is not ferrous sulfate induced.  She reports that she had her OB/GYN appointment.  She is not pregnant.  She reports that her pregnancy test was a false positive.  This is disappointing for her, obviously.  She continues to hold her iron.  She admits that her nausea is improved since stopping PO iron replacement.    Today she reports fatigue.  Of note, her HGB has declined some.  Iron studies are pending.  As a result in this change in her HGB, I suspect she may need IV iron replacement.  I discussed the iron deficit calculation and that result will provide [place] with the most appropriate dose and replacement iron for her.  I reviewed the risks, benefits, alternatives, side effects of IV iron replacement including anaphylaxis and death.  She denies any known blood loss.  She has not had a menstrual cycle in years.  Past Medical History  Diagnosis Date  . Migraines   . Osteoporosis   . Seasonal allergies   . Sinusitis   . Back pain,  chronic   . Nicotine addiction   . Constipation   . Vitamin D deficiency   . Gastritis   . Gastroparesis   . Pyloric spasm 03/30/2011  . Gastric outlet obstruction   . COPD (chronic obstructive pulmonary disease) ([place])   . Asthma   . Substance abuse 2008    marijuana  . Peptic ulcer disease   . Depression 2000    h/o suicidal ideation  . Anemia of other chronic disease 11/29/2012  . Chronic abdominal pain   . Nausea and vomiting     recurrent  . 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     is allergic to benadryl; latex; and penicillins.  Current Outpatient Prescriptions on File Prior to Visit  Medication Sig Dispense Refill  .  albuterol (PROVENTIL HFA;VENTOLIN HFA) 108 (90 BASE) MCG/ACT inhaler Inhale 2 puffs into the lungs every 6 (six) hours as needed. Shortness of breath (Patient taking differently: Inhale 2 puffs into the lungs every 6 (six) hours as needed for wheezing or shortness of breath. Shortness of breath) 6.7 g 4  . budesonide-formoterol (SYMBICORT) 80-4.5 MCG/ACT inhaler Inhale 2 puffs into the  lungs 2 (two) times daily. (Patient taking differently: Inhale 2 puffs into the lungs every evening. ) 1 Inhaler 4  . calcium-vitamin D (OSCAL WITH D) 500-200 MG-UNIT per tablet Take 1 tablet by mouth 2 (two) times daily.    . clobetasol cream (TEMOVATE) [id number] % Apply 1 application topically 2 (two) times daily as needed (rash). Apply to rash    . dexlansoprazole (DEXILANT) 60 MG capsule Take 1 capsule (60 mg total) by mouth daily. 90 capsule 0  . DULoxetine (CYMBALTA) 30 MG capsule Take 1 capsule (30 mg total) by mouth daily. 30 capsule 3  . ergocalciferol (VITAMIN D2) 50000 UNITS capsule Take 1 capsule (50,000 Units total) by mouth once a week. One capsule once weekly 12 capsule 1  . estradiol (ESTRACE VAGINAL) 0.1 MG/GM vaginal cream Place 2 g vaginally at bedtime. As directed    . feeding supplement (ENSURE CLINICAL STRENGTH) LIQD Take 237 mLs by mouth 3 (three) times daily with meals. (Patient taking differently: Take 237 mLs by mouth 2 (two) times daily. ) 10 Bottle 3  . gabapentin (NEURONTIN) 100 MG capsule Take 100 mg by mouth 3 (three) times daily.    Marland Kitchen HYDROcodone-acetaminophen (NORCO) 7.5-325 MG tablet 1 tablet every 4 (four) hours as needed.    . Linaclotide (LINZESS) 290 MCG CAPS capsule Take 1 capsule (290 mcg total) by mouth daily. 30 capsule 5  . loratadine (CLARITIN) 10 MG tablet Take 1 tablet (10 mg total) by mouth daily. 30 tablet 5  . mometasone (NASONEX) 50 MCG/ACT nasal spray Place 2 sprays into the nose 2 (two) times daily. 17 g 12  . NUCYNTA 50 MG TABS tablet Take 50 mg by mouth every 8 (eight) hours as needed for moderate pain.   0  . omeprazole (PRILOSEC) 40 MG capsule Take 1 capsule (40 mg total) by mouth 2 (two) times daily before a meal. 60 capsule 3  . ondansetron (ZOFRAN) 4 MG tablet TAKE 1 TABLET(4 MG) BY MOUTH EVERY 6 HOURS AS NEEDED FOR NAUSEA OR VOMITING 30 tablet 1  . pantoprazole (PROTONIX) 40 MG tablet Take 40 mg by mouth 2 (two) times daily.    . promethazine  (PHENERGAN) 25 MG tablet Take 1 tablet (25 mg total) by mouth every 6 (six) hours as needed for nausea or vomiting. 20 tablet 0  . raloxifene (EVISTA) 60 MG tablet Take 1 tablet (60 mg total) by mouth daily. 30 tablet 11  . simvastatin (ZOCOR) 20 MG tablet Take 20 mg by mouth daily. Reported on 02/04/2015    . sucralfate (CARAFATE) 1 G tablet Crush one tablet and mix in applesauce, take every morning and at bedtime as needed for stomach burning. 60 tablet 3  . tiZANidine (ZANAFLEX) 4 MG tablet Take 4 mg by mouth every 6 (six) hours as needed for muscle spasms.      No current facility-administered medications on file prior to visit.    Past Surgical History  Procedure Laterality Date  . Cholecystectomy      ?2002  . Laser surgery on cervix    . Carpal tunnel release  left hand  . Esophagogastroduodenoscopy  12/25/2010    Dorothyann Peng, MD;  moderate gastritis, ?goo secondary to pylorspasm. BX showed reactive gstropathy no h.pyori, SB mucosa with intramucosal lymphocytosis and partial villous blunting (TTG 4.0 normal)  . Esophageal dilation  12/25/2010    Procedure: ESOPHAGEAL DILATION;  Surgeon: Dorothyann Peng, MD;  [facility];  Service: Endoscopy;;  . Esophagogastroduodenoscopy N/A 11/14/2012    [id number] hiatal hernia. Abnormal gastric mucosa of  uncertain significance-status post biopsy. Subjectively, patient may have recurrent symptomatic, pylorospasm.  . Esophagogastroduodenoscopy (egd) with propofol N/A 02/09/2013    elongated stomach, partial lower esophageal ring widely patent. No obvious pyloric stenosis s/p Botox  . Botox injection N/A 02/09/2013    Procedure: BOTOX INJECTION;  Surgeon: Missy Sabins, MD;  Location: [facility];  Service: Endoscopy;  Laterality: N/A;  possible balloon  . Balloon dilation N/A 02/09/2013    Procedure: BALLOON DILATION;  Surgeon: Missy Sabins, MD;  Location: [facility];  Service: Endoscopy;  Laterality: N/A;  . Colonoscopy with  propofol N/A 09/20/2014    RMR: Normal ileo-colonoscopy    Denies any headaches, dizziness, double vision, fevers, chills, night sweats, diarrhea, constipation, chest pain, heart palpitations, shortness of breath, blood in stool, black tarry stool, urinary pain, urinary burning, urinary frequency, hematuria.   PHYSICAL EXAMINATION  ECOG PERFORMANCE STATUS: 1 - Symptomatic but completely ambulatory  Filed Vitals:   02/13/15 1445  BP: 114/60  Pulse: 66  Temp: 98.8 F (37.1 C)  Resp: 18    GENERAL:alert, no distress, well nourished, cooperative, smiling and unaccompanied, excited and smiling with news of pregnancy. SKIN: skin color, texture, turgor are normal, no rashes or significant lesions HEAD: Normocephalic, No masses, lesions, tenderness or abnormalities EYES: normal, PERRLA, EOMI, Conjunctiva are pink and non-injected EARS: External ears normal OROPHARYNX:lips, buccal mucosa, and tongue normal and mucous membranes are moist  NECK: supple, no adenopathy, thyroid normal size, non-tender, without nodularity, no stridor, non-tender, trachea midline LYMPH:  no palpable lymphadenopathy BREAST:not examined LUNGS: clear to auscultation  HEART: regular rate & rhythm, no murmurs, no gallops, S1 normal and S2 normal ABDOMEN:abdomen soft, non-tender and normal bowel sounds BACK: Back symmetric, no curvature. EXTREMITIES:less then 2 second capillary refill, no joint deformities, effusion, or inflammation, no skin discoloration, no cyanosis  NEURO: alert & oriented x 3 with fluent speech, no focal motor/sensory deficits, gait normal   LABORATORY DATA: CBC    Component Value Date/Time   WBC 9.8 02/13/2015 1347   RBC 3.62* 02/13/2015 1347   HGB 10.8* 02/13/2015 1347   HCT 32.6* 02/13/2015 1347   PLT 152 02/13/2015 1347   MCV 90.1 02/13/2015 1347   MCH 29.8 02/13/2015 1347   MCHC 33.1 02/13/2015 1347   RDW 13.6 02/13/2015 1347   LYMPHSABS 2.9 02/13/2015 1347   MONOABS 0.6  02/13/2015 1347   EOSABS 0.1 02/13/2015 1347   BASOSABS 0.0 02/13/2015 1347      Chemistry      Component Value Date/Time   NA 139 12/22/2014 1829   K 3.4* 12/22/2014 1829   CL 98* 12/22/2014 1829   CO2 28 12/22/2014 1829   BUN 17 12/22/2014 1829   CREATININE 0.86 12/22/2014 1829   CREATININE 0.72 05/02/2014 0733      Component Value Date/Time   CALCIUM 10.2 12/22/2014 1829   ALKPHOS 67 12/22/2014 1829   AST 24 12/22/2014 1829   ALT 18 12/22/2014 1829   BILITOT 0.9 12/22/2014 1829     Lab  Results  Component Value Date   IRON 44 12/01/2013   TIBC 336 12/01/2013   FERRITIN 60 12/31/2014      PENDING LABS:   RADIOGRAPHIC STUDIES:  US Transvaginal Non-ob  02/05/2015  GYNECOLOGIC SONOGRAM Tracey Daniel is a 35 y.o. G1P0 premenopausal,she is here for a pelvic sonogram for nausea,positive beta HCG. Uterus                      4.1 x 1.2 x 2 cm, small anteverted uterus Endometrium          1 mm, symmetrical, small amount of fluid w/in the endometrium Right ovary             .8 x .6 x .7 cm, wnl Left ovary                .9 x .7 x .8 cm, wnl Technician Comments: PELVIC US TA: small anteverted uterus w/ a small amount of fluid w/in the endometrium,EEC 1 mm,normal ov's bilat (limited view because of bowel gas),no free fluid,no pain during ultrasound,no IUP seen,pt is seeing Dr. Glo Herring after ultrasound. Amber Heide Guile 02/04/2015 9:47 AM Clinical Impression and recommendations: I have reviewed the sonogram results above. Combined with the patient's current clinical course, below are my impressions and any appropriate recommendations for management based on the sonographic findings: 1. Small anteverted uterus, tiny almost atrophic in appearance. The patient reports a history of premature ovarian failure at a young age, 62 years and so the low positive pregnancy test is quite a surprise to her. 2. Low pregnancy levels such as this one or possible from production of hCG by the anterior  pituitary so we will check serum FSH value as well as a quantitative hCG Habits my impression that this likely represents a biologic false positive pregnancy test due to CNS production of hCG at low levels. 3. FSH value returns elevated, and is at postmenopausal levels, confirming premature ovarian failure diagnosis. Will recommend patient be seen for consideration of hormone replacement therapy as she is at long-term risk of osteoporosis FERGUSON,JOHN V    PATHOLOGY:    ASSESSMENT AND PLAN:  Anemia Anemia, previously on PO iron replacement with ferrous sulfate with improved HGB but with persistent nausea.  PO iron placed on hold in November 2016 with improvement in nausea.  She notes improvement in her nausea since holding her PO iron.  It has not yet completely resolved, but it is much improved.  She reports a false positive pregnancy test.  She is not pregnant she reports.  She denies any known blood loss.  She admits to increased fatigue since our last visit, but I note that her HGB has dropped some.  If her ferritin is less than 100, I will calculate her iron deficit and administer IV iron accordingly.  I reviewed the risks, benefits, alternatives, and side effects of IV iron.  She is agreeable to this if needed.  Labs in 3 months: CBC diff, iron/TIBC, ferritin  Return in 3 months for follow-up.  If IV iron is needed, then I will add another lab appointment about 4-6 weeks out to evaluate her response to iron.  THERAPY PLAN:  Continue to hold ferrous sulfate.  We will monitor labs and provide IV iron if needed.  All questions were answered. The patient knows to call the clinic with any problems, questions or concerns. We can certainly see the patient much sooner if necessary.  Patient and plan discussed with  Dr. Ancil Linsey and she is in agreement with the aforementioned.   This note is electronically signed by: Doy Mince 02/13/2015 6:49 PM

## 2015-02-13 ENCOUNTER — Encounter (HOSPITAL_COMMUNITY): Payer: Medicare Other

## 2015-02-13 ENCOUNTER — Encounter (HOSPITAL_COMMUNITY): Payer: Medicare Other | Attending: Oncology | Admitting: Oncology

## 2015-02-13 ENCOUNTER — Encounter (HOSPITAL_COMMUNITY): Payer: Self-pay | Admitting: Oncology

## 2015-02-13 VITALS — BP 114/60 | HR 66 | Temp 98.8°F | Resp 18 | Wt 139.0 lb

## 2015-02-13 DIAGNOSIS — D649 Anemia, unspecified: Secondary | ICD-10-CM | POA: Insufficient documentation

## 2015-02-13 LAB — IRON AND TIBC
Iron: 58 ug/dL (ref 28–170)
SATURATION RATIOS: 20 % (ref 10.4–31.8)
TIBC: 294 ug/dL (ref 250–450)
UIBC: 236 ug/dL

## 2015-02-13 LAB — CBC WITH DIFFERENTIAL/PLATELET
BASOS ABS: 0 10*3/uL (ref 0.0–0.1)
Basophils Relative: 0 %
Eosinophils Absolute: 0.1 10*3/uL (ref 0.0–0.7)
Eosinophils Relative: 1 %
HCT: 32.6 % — ABNORMAL LOW (ref 36.0–46.0)
HEMOGLOBIN: 10.8 g/dL — AB (ref 12.0–15.0)
LYMPHS ABS: 2.9 10*3/uL (ref 0.7–4.0)
LYMPHS PCT: 30 %
MCH: 29.8 pg (ref 26.0–34.0)
MCHC: 33.1 g/dL (ref 30.0–36.0)
MCV: 90.1 fL (ref 78.0–100.0)
Monocytes Absolute: 0.6 10*3/uL (ref 0.1–1.0)
Monocytes Relative: 6 %
NEUTROS PCT: 63 %
Neutro Abs: 6.1 10*3/uL (ref 1.7–7.7)
Platelets: 152 10*3/uL (ref 150–400)
RBC: 3.62 MIL/uL — AB (ref 3.87–5.11)
RDW: 13.6 % (ref 11.5–15.5)
WBC: 9.8 10*3/uL (ref 4.0–10.5)

## 2015-02-13 LAB — FERRITIN: FERRITIN: 73 ng/mL (ref 11–307)

## 2015-02-13 NOTE — Patient Instructions (Signed)
[place] at [facility] Discharge Instructions  RECOMMENDATIONS MADE BY THE CONSULTANT AND ANY TEST RESULTS WILL BE SENT TO YOUR REFERRING PHYSICIAN.  Exam and discussion today with Kirby Crigler, PA. Do not take oral iron. Lab work and office visit in 3 months.   Thank you for choosing [place] at [facility] to provide your oncology and hematology care.  To afford each patient quality time with our provider, please arrive at least 15 minutes before your scheduled appointment time.   Beginning January 23rd 2017 lab work for the [facility] will be done in the  Main lab at [facility] on 1st floor. If you have a lab appointment with the [place] please come in thru the  Main Entrance and check in at the main information desk  You need to re-schedule your appointment should you arrive 10 or more minutes late.  We strive to give you quality time with our providers, and arriving late affects you and other patients whose appointments are after yours.  Also, if you no show three or more times for appointments you may be dismissed from the clinic at the providers discretion.     Again, thank you for choosing [facility].  Our hope is that these requests will decrease the amount of time that you wait before being seen by our physicians.       _____________________________________________________________  Should you have questions after your visit to [facility], please contact our office at [phone] between the hours of 8:30 a.m. and 4:30 p.m.  Voicemails left after 4:30 p.m. will not be returned until the following business day.  For prescription refill requests, have your pharmacy contact our office.

## 2015-02-14 ENCOUNTER — Other Ambulatory Visit (HOSPITAL_COMMUNITY): Payer: Self-pay | Admitting: Oncology

## 2015-02-14 DIAGNOSIS — D500 Iron deficiency anemia secondary to blood loss (chronic): Secondary | ICD-10-CM

## 2015-02-18 NOTE — Progress Notes (Signed)
Patient ID: Tracey Daniel, female   DOB: December 11, 1980, 35 y.o.   MRN: [id number]   [facility] Visit  Patient name: Tracey Daniel MRN [id number]  Date of birth: 08/19/1980  CC & HPI:  Tracey Daniel is a 35 y.o. female presenting today for followup of labs for Premature ov failure, with low positive HCG. [place] elevated at 49.4  postmenopausal ROS:  Not on HT at present  Pertinent History Reviewed:   Reviewed: Significant for denies dyspareunia Medical         Past Medical History  Diagnosis Date  . Migraines   . Osteoporosis   . Seasonal allergies   . Sinusitis   . Back pain, chronic   . Nicotine addiction   . Constipation   . Vitamin D deficiency   . Gastritis   . Gastroparesis   . Pyloric spasm 03/30/2011  . Gastric outlet obstruction   . COPD (chronic obstructive pulmonary disease) ([place])   . Asthma   . Substance abuse 2008    marijuana  . Peptic ulcer disease   . Depression 2000    h/o suicidal ideation  . Anemia of other chronic disease 11/29/2012  . Chronic abdominal pain   . Nausea and vomiting     recurrent  . Cyst of skin     mid spine area                              Surgical Hx:    Past Surgical History  Procedure Laterality Date  . Cholecystectomy      ?2002  . Laser surgery on cervix    . Carpal tunnel release      left hand  . Esophagogastroduodenoscopy  12/25/2010    Dorothyann Peng, MD;  moderate gastritis, ?goo secondary to pylorspasm. BX showed reactive gstropathy no h.pyori, SB mucosa with intramucosal lymphocytosis and partial villous blunting (TTG 4.0 normal)  . Esophageal dilation  12/25/2010    Procedure: ESOPHAGEAL DILATION;  Surgeon: Dorothyann Peng, MD;  [facility];  Service: Endoscopy;;  . Esophagogastroduodenoscopy N/A 11/14/2012    [id number] hiatal hernia. Abnormal gastric mucosa of  uncertain significance-status post biopsy. Subjectively, patient may have recurrent symptomatic, pylorospasm.  .  Esophagogastroduodenoscopy (egd) with propofol N/A 02/09/2013    elongated stomach, partial lower esophageal ring widely patent. No obvious pyloric stenosis s/p Botox  . Botox injection N/A 02/09/2013    Procedure: BOTOX INJECTION;  Surgeon: Missy Sabins, MD;  [facility];  Service: Endoscopy;  Laterality: N/A;  possible balloon  . Balloon dilation N/A 02/09/2013    Procedure: BALLOON DILATION;  Surgeon: Missy Sabins, MD;  [facility];  Service: Endoscopy;  Laterality: N/A;  . Colonoscopy with propofol N/A 09/20/2014    RMR: Normal ileo-colonoscopy   Medications: Reviewed & Updated - see associated section                       Current outpatient prescriptions:  .  albuterol (PROVENTIL HFA;VENTOLIN HFA) 108 (90 BASE) MCG/ACT inhaler, Inhale 2 puffs into the lungs every 6 (six) hours as needed. Shortness of breath (Patient taking differently: Inhale 2 puffs into the lungs every 6 (six) hours as needed for wheezing or shortness of breath. Shortness of breath), Disp: 6.7 g, Rfl: 4 .  budesonide-formoterol (SYMBICORT) 80-4.5 MCG/ACT inhaler, Inhale 2 puffs into the lungs 2 (two) times daily. (Patient  taking differently: Inhale 2 puffs into the lungs every evening. ), Disp: 1 Inhaler, Rfl: 4 .  calcium-vitamin D (OSCAL WITH D) 500-200 MG-UNIT per tablet, Take 1 tablet by mouth 2 (two) times daily., Disp: , Rfl:  .  clobetasol cream (TEMOVATE) [id number] %, Apply 1 application topically 2 (two) times daily as needed (rash). Apply to rash, Disp: , Rfl:  .  dexlansoprazole (DEXILANT) 60 MG capsule, Take 1 capsule (60 mg total) by mouth daily., Disp: 90 capsule, Rfl: 0 .  DULoxetine (CYMBALTA) 30 MG capsule, Take 1 capsule (30 mg total) by mouth daily., Disp: 30 capsule, Rfl: 3 .  ergocalciferol (VITAMIN D2) 50000 UNITS capsule, Take 1 capsule (50,000 Units total) by mouth once a week. One capsule once weekly, Disp: 12 capsule, Rfl: 1 .  estradiol (ESTRACE VAGINAL) 0.1 MG/GM vaginal cream, Place  2 g vaginally at bedtime. As directed, Disp: , Rfl:  .  feeding supplement (ENSURE CLINICAL STRENGTH) LIQD, Take 237 mLs by mouth 3 (three) times daily with meals. (Patient taking differently: Take 237 mLs by mouth 2 (two) times daily. ), Disp: 10 Bottle, Rfl: 3 .  gabapentin (NEURONTIN) 100 MG capsule, Take 100 mg by mouth 3 (three) times daily., Disp: , Rfl:  .  HYDROcodone-acetaminophen (NORCO) 7.5-325 MG tablet, 1 tablet every 4 (four) hours as needed., Disp: , Rfl:  .  Linaclotide (LINZESS) 290 MCG CAPS capsule, Take 1 capsule (290 mcg total) by mouth daily., Disp: 30 capsule, Rfl: 5 .  loratadine (CLARITIN) 10 MG tablet, Take 1 tablet (10 mg total) by mouth daily., Disp: 30 tablet, Rfl: 5 .  mometasone (NASONEX) 50 MCG/ACT nasal spray, Place 2 sprays into the nose 2 (two) times daily., Disp: 17 g, Rfl: 12 .  NUCYNTA 50 MG TABS tablet, Take 50 mg by mouth every 8 (eight) hours as needed for moderate pain. , Disp: , Rfl: 0 .  omeprazole (PRILOSEC) 40 MG capsule, Take 1 capsule (40 mg total) by mouth 2 (two) times daily before a meal., Disp: 60 capsule, Rfl: 3 .  ondansetron (ZOFRAN) 4 MG tablet, TAKE 1 TABLET(4 MG) BY MOUTH EVERY 6 HOURS AS NEEDED FOR NAUSEA OR VOMITING, Disp: 30 tablet, Rfl: 1 .  pantoprazole (PROTONIX) 40 MG tablet, Take 40 mg by mouth 2 (two) times daily., Disp: , Rfl:  .  promethazine (PHENERGAN) 25 MG tablet, Take 1 tablet (25 mg total) by mouth every 6 (six) hours as needed for nausea or vomiting., Disp: 20 tablet, Rfl: 0 .  simvastatin (ZOCOR) 20 MG tablet, Take 20 mg by mouth daily. Reported on 02/04/2015, Disp: , Rfl:  .  sucralfate (CARAFATE) 1 G tablet, Crush one tablet and mix in applesauce, take every morning and at bedtime as needed for stomach burning., Disp: 60 tablet, Rfl: 3 .  tiZANidine (ZANAFLEX) 4 MG tablet, Take 4 mg by mouth every 6 (six) hours as needed for muscle spasms. , Disp: , Rfl:  .  raloxifene (EVISTA) 60 MG tablet, Take 1 tablet (60 mg total) by  mouth daily., Disp: 30 tablet, Rfl: 11   Social History: Reviewed -  reports that she has been smoking Cigarettes.  She has a 1.5 pack-year smoking history. She has never used smokeless tobacco.  Objective Findings:  Vitals: Blood pressure 118/68, height 5\' 6"  (1.676 m), weight 140 lb (63.504 kg), not currently breastfeeding.  Physical Examination: General appearance - alert, well appearing, and in no distress, oriented to person, place, and time and normal appearing weight Mental  status - alert, oriented to person, place, and time, normal mood, behavior, speech, dress, motor activity, and thought processes Eyes - pupils equal and reactive, extraocular eye movements intact  I spent 15 minutes with the visit with >than 50% spent in counseling and coordination of care. Assessment & Plan:   A:  1. Premature ov failure without current HT  2 at risk for osteoporosis  P:  1. Add raloxifene 2 baseline dexa scan 3 continue estrace VC for atrophic vaginitis

## 2015-02-19 ENCOUNTER — Encounter (HOSPITAL_COMMUNITY): Payer: Self-pay | Admitting: Family Medicine

## 2015-02-19 ENCOUNTER — Emergency Department (HOSPITAL_COMMUNITY)
Admission: EM | Admit: 2015-02-19 | Discharge: 2015-02-19 | Disposition: A | Discharge status: Home / Self Care | Payer: Medicare Other | Attending: Emergency Medicine | Admitting: Emergency Medicine

## 2015-02-19 DIAGNOSIS — Z880 Allergy status to penicillin: Secondary | ICD-10-CM | POA: Diagnosis not present

## 2015-02-19 DIAGNOSIS — Z91040 Latex allergy status: Secondary | ICD-10-CM | POA: Insufficient documentation

## 2015-02-19 DIAGNOSIS — K5900 Constipation, unspecified: Secondary | ICD-10-CM | POA: Diagnosis not present

## 2015-02-19 DIAGNOSIS — Z862 Personal history of diseases of the blood and blood-forming organs and certain disorders involving the immune mechanism: Secondary | ICD-10-CM | POA: Diagnosis not present

## 2015-02-19 DIAGNOSIS — K279 Peptic ulcer, site unspecified, unspecified as acute or chronic, without hemorrhage or perforation: Secondary | ICD-10-CM | POA: Diagnosis not present

## 2015-02-19 DIAGNOSIS — G8929 Other chronic pain: Secondary | ICD-10-CM | POA: Diagnosis not present

## 2015-02-19 DIAGNOSIS — Z79899 Other long term (current) drug therapy: Secondary | ICD-10-CM | POA: Insufficient documentation

## 2015-02-19 DIAGNOSIS — F17210 Nicotine dependence, cigarettes, uncomplicated: Secondary | ICD-10-CM | POA: Diagnosis not present

## 2015-02-19 DIAGNOSIS — E559 Vitamin D deficiency, unspecified: Secondary | ICD-10-CM | POA: Insufficient documentation

## 2015-02-19 DIAGNOSIS — R197 Diarrhea, unspecified: Secondary | ICD-10-CM | POA: Diagnosis not present

## 2015-02-19 DIAGNOSIS — Z9049 Acquired absence of other specified parts of digestive tract: Secondary | ICD-10-CM | POA: Diagnosis not present

## 2015-02-19 DIAGNOSIS — M810 Age-related osteoporosis without current pathological fracture: Secondary | ICD-10-CM | POA: Diagnosis not present

## 2015-02-19 DIAGNOSIS — J449 Chronic obstructive pulmonary disease, unspecified: Secondary | ICD-10-CM | POA: Insufficient documentation

## 2015-02-19 DIAGNOSIS — G43909 Migraine, unspecified, not intractable, without status migrainosus: Secondary | ICD-10-CM | POA: Diagnosis not present

## 2015-02-19 DIAGNOSIS — K219 Gastro-esophageal reflux disease without esophagitis: Secondary | ICD-10-CM | POA: Insufficient documentation

## 2015-02-19 DIAGNOSIS — Z872 Personal history of diseases of the skin and subcutaneous tissue: Secondary | ICD-10-CM | POA: Insufficient documentation

## 2015-02-19 DIAGNOSIS — Z7951 Long term (current) use of inhaled steroids: Secondary | ICD-10-CM | POA: Insufficient documentation

## 2015-02-19 DIAGNOSIS — R112 Nausea with vomiting, unspecified: Secondary | ICD-10-CM | POA: Diagnosis present

## 2015-02-19 DIAGNOSIS — F329 Major depressive disorder, single episode, unspecified: Secondary | ICD-10-CM | POA: Insufficient documentation

## 2015-02-19 DIAGNOSIS — E876 Hypokalemia: Secondary | ICD-10-CM | POA: Insufficient documentation

## 2015-02-19 LAB — COMPREHENSIVE METABOLIC PANEL
ALBUMIN: 5 g/dL (ref 3.5–5.0)
ALK PHOS: 71 U/L (ref 38–126)
ALT: 16 U/L (ref 14–54)
ANION GAP: 14 (ref 5–15)
AST: 32 U/L (ref 15–41)
BILIRUBIN TOTAL: 0.9 mg/dL (ref 0.3–1.2)
BUN: 19 mg/dL (ref 6–20)
CALCIUM: 9.9 mg/dL (ref 8.9–10.3)
CO2: 22 mmol/L (ref 22–32)
Chloride: 104 mmol/L (ref 101–111)
Creatinine, Ser: 0.83 mg/dL (ref 0.44–1.00)
GFR calc Af Amer: >60 mL/min (ref >60)
GFR calc non Af Amer: >60 mL/min (ref >60)
Glucose, Bld: 154 mg/dL — ABNORMAL HIGH (ref 65–99)
Potassium: 3.2 mmol/L — ABNORMAL LOW (ref 3.5–5.1)
SODIUM: 140 mmol/L (ref 135–145)
TOTAL PROTEIN: 8 g/dL (ref 6.5–8.1)

## 2015-02-19 LAB — CBC WITH DIFFERENTIAL/PLATELET
BASOS ABS: 0 10*3/uL (ref 0.0–0.1)
BASOS PCT: 0 %
EOS ABS: 0 10*3/uL (ref 0.0–0.7)
Eosinophils Relative: 0 %
HEMATOCRIT: 33.5 % — AB (ref 36.0–46.0)
HEMOGLOBIN: 11 g/dL — AB (ref 12.0–15.0)
LYMPHS ABS: 1.1 10*3/uL (ref 0.7–4.0)
LYMPHS PCT: 9 %
MCH: 29.4 pg (ref 26.0–34.0)
MCHC: 32.8 g/dL (ref 30.0–36.0)
MCV: 89.6 fL (ref 78.0–100.0)
MONOS PCT: 7 %
Monocytes Absolute: 0.8 10*3/uL (ref 0.1–1.0)
Neutro Abs: 10.1 10*3/uL — ABNORMAL HIGH (ref 1.7–7.7)
Neutrophils Relative %: 84 %
Platelets: 179 10*3/uL (ref 150–400)
RBC: 3.74 MIL/uL — ABNORMAL LOW (ref 3.87–5.11)
RDW: 13.7 % (ref 11.5–15.5)
WBC: 12.1 10*3/uL — ABNORMAL HIGH (ref 4.0–10.5)

## 2015-02-19 LAB — LIPASE, BLOOD: Lipase: 26 U/L (ref 11–51)

## 2015-02-19 MED ORDER — ONDANSETRON HCL 4 MG/2ML IJ SOLN
4.0000 mg | Freq: Once | INTRAMUSCULAR | Status: AC
  Administered 2015-02-19: 4 mg via INTRAVENOUS
  Filled 2015-02-19: qty 2

## 2015-02-19 MED ORDER — DICYCLOMINE HCL 10 MG/ML IM SOLN
20.0000 mg | Freq: Once | INTRAMUSCULAR | Status: AC
  Administered 2015-02-19: 20 mg via INTRAMUSCULAR
  Filled 2015-02-19: qty 2

## 2015-02-19 MED ORDER — PANTOPRAZOLE SODIUM 40 MG IV SOLR
40.0000 mg | Freq: Once | INTRAVENOUS | Status: AC
  Administered 2015-02-19: 40 mg via INTRAVENOUS
  Filled 2015-02-19: qty 40

## 2015-02-19 MED ORDER — POTASSIUM CHLORIDE CRYS ER 20 MEQ PO TBCR
40.0000 meq | EXTENDED_RELEASE_TABLET | Freq: Once | ORAL | Status: AC
  Administered 2015-02-19: 40 meq via ORAL
  Filled 2015-02-19: qty 2

## 2015-02-19 MED ORDER — POTASSIUM CHLORIDE CRYS ER 20 MEQ PO TBCR: 20.0000 meq | EXTENDED_RELEASE_TABLET | Freq: Every day | ORAL | Status: DC

## 2015-02-19 MED ORDER — PROMETHAZINE HCL 25 MG PO TABS: 25.0000 mg | ORAL_TABLET | Freq: Four times a day (QID) | ORAL | Status: DC | PRN

## 2015-02-19 MED ORDER — SODIUM CHLORIDE 0.9 % IV BOLUS (SEPSIS)
1000.0000 mL | Freq: Once | INTRAVENOUS | Status: AC
  Administered 2015-02-19: 1000 mL via INTRAVENOUS

## 2015-02-19 MED ORDER — PROMETHAZINE HCL 25 MG/ML IJ SOLN
25.0000 mg | Freq: Once | INTRAMUSCULAR | Status: AC
  Administered 2015-02-19: 25 mg via INTRAVENOUS
  Filled 2015-02-19: qty 1

## 2015-02-19 NOTE — Discharge Instructions (Signed)

## 2015-02-19 NOTE — ED Provider Notes (Signed)
CSN: [id number]     Arrival date & time 02/19/15  0604 History   First MD Initiated Contact with Patient 02/19/15 [phone]     Chief Complaint  Patient presents with  . Abdominal Pain  . Emesis     (Consider location/radiation/quality/duration/timing/severity/associated sxs/prior Treatment) HPI  35 year old female with history of recurrent abdominal pain nausea vomiting in the setting of refractory GERD, history of pylorospasm causing gastric outlet obstruction, substance abuse, peptic ulcer disease, chronic abdominal pain presenting for evaluation of abdominal pain. Patient report last night after eating a 4 pound or burger she begin to develop sharp crampy upper abdominal pain follows with nausea and vomiting. States she has had more than 10 bouts of nonbloody nonbilious vomiting, and having recurrent diarrhea with mucus. She has been vomiting throughout the night and was unable to keep any medication down. She reported that her pain is 10 out of 10, persistent and nonradiating. She denies having fever, headache, chest pain, difficulty breathing, productive cough, dysuria, or rash.    Past Medical History  Diagnosis Date  . Migraines   . Osteoporosis   . Seasonal allergies   . Sinusitis   . Back pain, chronic   . Nicotine addiction   . Constipation   . Vitamin D deficiency   . Gastritis   . Gastroparesis   . Pyloric spasm 03/30/2011  . Gastric outlet obstruction   . COPD (chronic obstructive pulmonary disease) ([place])   . Asthma   . Substance abuse 2008    marijuana  . Peptic ulcer disease   . Depression 2000    h/o suicidal ideation  . Anemia of other chronic disease 11/29/2012  . Chronic abdominal pain   . Nausea and vomiting     recurrent  . Cyst of skin     mid spine area   Past Surgical History  Procedure Laterality Date  . Cholecystectomy      ?2002  . Laser surgery on cervix    . Carpal tunnel release      left hand  . Esophagogastroduodenoscopy  12/25/2010     Dorothyann Peng, MD;  moderate gastritis, ?goo secondary to pylorspasm. BX showed reactive gstropathy no h.pyori, SB mucosa with intramucosal lymphocytosis and partial villous blunting (TTG 4.0 normal)  . Esophageal dilation  12/25/2010    Procedure: ESOPHAGEAL DILATION;  Surgeon: Dorothyann Peng, MD;  Location: [facility];  Service: Endoscopy;;  . Esophagogastroduodenoscopy N/A 11/14/2012    [id number] hiatal hernia. Abnormal gastric mucosa of  uncertain significance-status post biopsy. Subjectively, patient may have recurrent symptomatic, pylorospasm.  . Esophagogastroduodenoscopy (egd) with propofol N/A 02/09/2013    elongated stomach, partial lower esophageal ring widely patent. No obvious pyloric stenosis s/p Botox  . Botox injection N/A 02/09/2013    Procedure: BOTOX INJECTION;  Surgeon: Missy Sabins, MD;  Location: [facility];  Service: Endoscopy;  Laterality: N/A;  possible balloon  . Balloon dilation N/A 02/09/2013    Procedure: BALLOON DILATION;  Surgeon: Missy Sabins, MD;  Location: [facility];  Service: Endoscopy;  Laterality: N/A;  . Colonoscopy with propofol N/A 09/20/2014    RMR: Normal ileo-colonoscopy   Family History  Problem Relation Age of Onset  . Diabetes Father   . Liver disease Father     liver transplant at [facility], age 28  . Colon cancer Neg Hx   . Lung cancer Mother 23  . Heart disease Maternal Grandmother   . Parkinson's disease Maternal Grandfather   . Multiple sclerosis  Sister 66  . Depression Sister 16    bipolar and schizophrenic  . Alcohol abuse Sister    Social History  Substance Use Topics  . Smoking status: Current Every Day Smoker -- 0.10 packs/day for 15 years    Types: Cigarettes  . Smokeless tobacco: Never Used     Comment: smokes two  cigarette daily  . Alcohol Use: No     Comment: none since July 2016. Periodic heavy drinker for months at a time.   OB History    Gravida Para Term Preterm AB TAB SAB Ectopic Multiple Living   1               Review of Systems  All other systems reviewed and are negative.     Allergies  Benadryl; Latex; and Penicillins  Home Medications   Prior to Admission medications   Medication Sig Start Date End Date Taking? Authorizing Provider  albuterol (PROVENTIL HFA;VENTOLIN HFA) 108 (90 BASE) MCG/ACT inhaler Inhale 2 puffs into the lungs every 6 (six) hours as needed. Shortness of breath Patient taking differently: Inhale 2 puffs into the lungs every 6 (six) hours as needed for wheezing or shortness of breath. Shortness of breath 11/14/12 05/27/15  [name], MD  budesonide-formoterol [facility]) 80-4.5 MCG/ACT inhaler Inhale 2 puffs into the lungs 2 (two) times daily. Patient taking differently: Inhale 2 puffs into the lungs every evening.  11/14/12 05/27/15  [name], MD  calcium-vitamin D (OSCAL WITH D) 500-200 MG-UNIT per tablet Take 1 tablet by mouth 2 (two) times daily.    Historical Provider, MD  clobetasol cream (TEMOVATE) [id number] % Apply 1 application topically 2 (two) times daily as needed (rash). Apply to rash    Historical Provider, MD  dexlansoprazole (DEXILANT) 60 MG capsule Take 1 capsule (60 mg total) by mouth daily. 11/30/14   Orvil Feil, NP  DULoxetine (CYMBALTA) 30 MG capsule Take 1 capsule (30 mg total) by mouth daily. 05/16/14   [name], MD  ergocalciferol (VITAMIN D2) 50000 UNITS capsule Take 1 capsule (50,000 Units total) by mouth once a week. One capsule once weekly 05/16/14   [name], MD  estradiol (ESTRACE VAGINAL) 0.1 MG/GM vaginal cream Place 2 g vaginally at bedtime. As directed    Historical Provider, MD  feeding supplement (ENSURE CLINICAL STRENGTH) LIQD Take 237 mLs by mouth 3 (three) times daily with meals. Patient taking differently: Take 237 mLs by mouth 2 (two) times daily.  01/01/11   Nishant Dhungel, MD  gabapentin (NEURONTIN) 100 MG capsule Take 100 mg by mouth 3 (three) times daily.    Historical Provider, MD   HYDROcodone-acetaminophen (NORCO) 7.5-325 MG tablet 1 tablet every 4 (four) hours as needed. 01/31/14   Historical Provider, MD  Linaclotide Rolan Lipa) 290 MCG CAPS capsule Take 1 capsule (290 mcg total) by mouth daily. 08/27/14   Mahala Menghini, PA-C  loratadine (CLARITIN) 10 MG tablet Take 1 tablet (10 mg total) by mouth daily. 10/09/11   [name], MD  mometasone (NASONEX) 50 MCG/ACT nasal spray Place 2 sprays into the nose 2 (two) times daily. 05/16/14   [name], MD  NUCYNTA 50 MG TABS tablet Take 50 mg by mouth every 8 (eight) hours as needed for moderate pain.  08/27/14   Historical Provider, MD  omeprazole (PRILOSEC) 40 MG capsule Take 1 capsule (40 mg total) by mouth 2 (two) times daily before a meal. 11/20/14   Mahala Menghini, PA-C  ondansetron (ZOFRAN) 4 MG tablet TAKE 1 TABLET(4 MG) BY MOUTH EVERY 6 HOURS AS NEEDED FOR NAUSEA OR VOMITING 11/30/14   Orvil Feil, NP  pantoprazole (PROTONIX) 40 MG tablet Take 40 mg by mouth 2 (two) times daily.    Historical Provider, MD  promethazine (PHENERGAN) 25 MG tablet Take 1 tablet (25 mg total) by mouth every 6 (six) hours as needed for nausea or vomiting. 12/22/14   Stevi Barrett, PA-C  raloxifene (EVISTA) 60 MG tablet Take 1 tablet (60 mg total) by mouth daily. 02/11/15   Jonnie Kind, MD  simvastatin (ZOCOR) 20 MG tablet Take 20 mg by mouth daily. Reported on 02/04/2015    Historical Provider, MD  sucralfate (CARAFATE) 1 G tablet Crush one tablet and mix in applesauce, take every morning and at bedtime as needed for stomach burning. 08/27/14   Mahala Menghini, PA-C  tiZANidine (ZANAFLEX) 4 MG tablet Take 4 mg by mouth every 6 (six) hours as needed for muscle spasms.  10/27/13   Historical Provider, MD   There were no vitals taken for this visit. Physical Exam  Constitutional: She appears well-developed and well-nourished. No distress.  African-American female, actively vomiting and appears moderate discomfort.  HENT:  Head: Atraumatic.   Eyes: Conjunctivae are normal.  Neck: Neck supple.  Cardiovascular: Normal rate and regular rhythm.   Pulmonary/Chest: Effort normal and breath sounds normal. No respiratory distress. She has no wheezes.  Abdominal: Soft. Bowel sounds are normal. She exhibits no distension. There is tenderness (Diffuse abdominal tenderness most significant epigastric without guarding or rebound tenderness.).  Negative Murphy sign, no pain at McBurney's point  Neurological: She is alert.  Skin: No rash noted.  Psychiatric: She has a normal mood and affect.  Nursing note and vitals reviewed.   ED Course  Procedures (including critical care time) Labs Review Labs Reviewed  CBC WITH DIFFERENTIAL/PLATELET - Abnormal; Notable for the following:    WBC 12.1 (*)    RBC 3.74 (*)    Hemoglobin 11.0 (*)    HCT 33.5 (*)    Neutro Abs 10.1 (*)    All other components within normal limits  COMPREHENSIVE METABOLIC PANEL - Abnormal; Notable for the following:    Potassium 3.2 (*)    Glucose, Bld 154 (*)    All other components within normal limits  LIPASE, BLOOD      MDM   Final diagnoses:  Nausea vomiting and diarrhea  Hypokalemia    BP 131/75 mmHg  Pulse 97  Temp(Src) 97.9 F (36.6 C) (Oral)  Resp 16  SpO2 100%   6:28 AM Patient presents complaining of abdominal pain nausea vomiting diarrhea after eating a hamburger. She has history of gastric outlet obstruction and pylorospasm. She has a significant history of GERD, history of recurrent abdominal pain with intractable nausea and vomiting related to her gastroparesis. She has been evaluated for this multiple times in the past and does have a GI specialist. Patient currently requesting for pain medication, my goal is to treat her symptoms, and help getting her nausea under control. Ibuprofen given. Antinausea medication given. I will monitor patient closely. At this time have low suspicion for acute emergent medical pathology. No peritoneal signs  on abdominal exam.  Prior cholecystectomy.  NO pain at McBurney's point to suggest appendicitis. Pt with hx of ovarian failure and chronically elevated bHcG, which she has been evaluated in the past.    8:28 AM Patient continued to endorse nausea and abdominal discomfort. I  do not think narcotic pain medication is particularly helpful giving her history of gastroparesis and gastric outlet obstruction along with chronic abdominal pain. Will provide Bentyl, and Phenergan along with IV fluid hydration.  10:16 AM Patient felt better. She is able to tolerate by mouth. Mild hypokalemia with a potassium level of 3.2 , patient was given potassium supplementation. She is stable for discharge. I encouraged patient to follow with her GI specialist for further management. Return precautions discussed.  Domenic Moras, PA-C 02/19/15 [place], MD 02/20/15 [phone]

## 2015-02-19 NOTE — ED Notes (Signed)
Pt reports she is complaining of abd pain with vomiting after eating a quarter pounder around 22:30 last night. Not took medication for symptoms.

## 2015-02-19 NOTE — ED Notes (Signed)
Pt tolerated PO challenge

## 2015-02-25 ENCOUNTER — Encounter (HOSPITAL_COMMUNITY): Payer: Medicare Other | Attending: Oncology

## 2015-02-25 VITALS — BP 114/63 | HR 54 | Temp 98.4°F | Resp 16

## 2015-02-25 DIAGNOSIS — D649 Anemia, unspecified: Secondary | ICD-10-CM | POA: Diagnosis not present

## 2015-02-25 DIAGNOSIS — D500 Iron deficiency anemia secondary to blood loss (chronic): Secondary | ICD-10-CM

## 2015-02-25 MED ORDER — SODIUM CHLORIDE 0.9 % IV SOLN
125.0000 mg | Freq: Once | INTRAVENOUS | Status: AC
  Administered 2015-02-25: 125 mg via INTRAVENOUS
  Filled 2015-02-25: qty 10

## 2015-02-25 MED ORDER — SODIUM CHLORIDE 0.9% FLUSH
10.0000 mL | Freq: Once | INTRAVENOUS | Status: AC
  Administered 2015-02-25: 10 mL via INTRAVENOUS

## 2015-02-25 MED ORDER — SODIUM CHLORIDE 0.9 % IV SOLN
INTRAVENOUS | Status: DC
  Administered 2015-02-25: 13:00:00 via INTRAVENOUS

## 2015-02-25 NOTE — Patient Instructions (Signed)
[facility] at [facility] Discharge Instructions  RECOMMENDATIONS MADE BY THE CONSULTANT AND ANY TEST RESULTS WILL BE SENT TO YOUR REFERRING PHYSICIAN.  Ferric gluconate 125 mg iron infusion given today as ordered. Return as scheduled.  Thank you for choosing  [facility] at [facility] to provide your oncology and hematology care.  To afford each patient quality time with our provider, please arrive at least 15 minutes before your scheduled appointment time.   Beginning January 23rd 2017 lab work for the Cancer Center will be done in the  Main lab at [facility] on 1st floor. If you have a lab appointment with the Cancer Center please come in thru the  Main Entrance and check in at the main information desk  You need to re-schedule your appointment should you arrive 10 or more minutes late.  We strive to give you quality time with our providers, and arriving late affects you and other patients whose appointments are after yours.  Also, if you no show three or more times for appointments you may be dismissed from the clinic at the providers discretion.     Again, thank you for choosing [facility].  Our hope is that these requests will decrease the amount of time that you wait before being seen by our physicians.       _____________________________________________________________  Should you have questions after your visit to  Cancer Center, please contact our office at [phone] between the hours of 8:30 a.m. and 4:30 p.m.  Voicemails left after 4:30 p.m. will not be returned until the following business day.  For prescription refill requests, have your pharmacy contact our office.    

## 2015-02-25 NOTE — Progress Notes (Signed)
Tolerated iron infusion well. Ambulatory on discharge home to self. 

## 2015-02-26 ENCOUNTER — Encounter: Payer: Self-pay | Admitting: Family Medicine

## 2015-02-26 ENCOUNTER — Ambulatory Visit (INDEPENDENT_AMBULATORY_CARE_PROVIDER_SITE_OTHER): Payer: Medicare Other | Admitting: Family Medicine

## 2015-02-26 VITALS — BP 124/74 | HR 86 | Resp 16 | Ht 66.0 in | Wt 141.0 lb

## 2015-02-26 DIAGNOSIS — Z0000 Encounter for general adult medical examination without abnormal findings: Secondary | ICD-10-CM | POA: Diagnosis not present

## 2015-02-26 DIAGNOSIS — F17208 Nicotine dependence, unspecified, with other nicotine-induced disorders: Secondary | ICD-10-CM

## 2015-02-26 DIAGNOSIS — Z79899 Other long term (current) drug therapy: Secondary | ICD-10-CM

## 2015-02-26 DIAGNOSIS — E559 Vitamin D deficiency, unspecified: Secondary | ICD-10-CM

## 2015-02-26 NOTE — Assessment & Plan Note (Signed)

## 2015-02-26 NOTE — Progress Notes (Signed)
Subjective:    Patient ID: Tracey Daniel, female    DOB: April 07, 1980, 35 y.o.   MRN: [id number]  HPI  Preventive Screening-Counseling & Management   Patient present here today for a Medicare annual wellness visit.   Current Problems (verified)   Medications Prior to Visit Allergies (verified)   PAST HISTORY  Family History  Social History    Risk Factors  Current exercise habits:  None specific, active with housework  Dietary issues discussed:low fat diet rich in vegetable   Cardiac risk factors:   Depression Screen  (Note: if answer to either of the following is "Yes", a more complete depression screening is indicated)   Over the past two weeks, have you felt down, depressed or hopeless? No  Over the past two weeks, have you felt little interest or pleasure in doing things? No  Have you lost interest or pleasure in daily life? No  Do you often feel hopeless? No  Do you cry easily over simple problems? No   Activities of Daily Living  In your present state of health, do you have any difficulty performing the following activities?  Driving?: No Managing money?: No Feeding yourself?:No Getting from bed to chair?:No Climbing a flight of stairs?:No Preparing food and eating?:No Bathing or showering?:No Getting dressed?:No Getting to the toilet?:No Using the toilet?:No Moving around from place to place?: yes at times due to intermittent right ankle pain,back and right hip pain  Fall Risk Assessment In the past year have you fallen or had a near fall?:No Are you currently taking any medications that make you dizzy?:No   Hearing Difficulties: No Do you often ask people to speak up or repeat themselves?:No Do you experience ringing or noises in your ears?:No Do you have difficulty understanding soft or whispered voices?:No  Cognitive Testing  Alert? Yes Normal Appearance?Yes  Oriented to person? Yes Place? Yes  Time? Yes  Displays appropriate  judgment?Yes  Can read the correct time from a watch face? yes Are you having problems remembering things?No  Advanced Directives have been discussed with the patient?Yes    List the Names of Other Physician/Practitioners you currently use:    Indicate any recent Medical Services you may have received from other than [facility] providers in the past year (date may be approximate).   Assessment:    Annual Wellness Exam   Plan:      Medicare Attestation  I have personally reviewed:  The patient's medical and social history  Their use of alcohol, tobacco or illicit drugs  Their current medications and supplements  The patient's functional ability including ADLs,fall risks, home safety risks, cognitive, and hearing and visual impairment  Diet and physical activities  Evidence for depression or mood disorders  The patient's weight, height, BMI, and visual acuity have been recorded in the chart. I have made referrals, counseling, and provided education to the patient based on review of the above and I have provided the patient with a written personalized care plan for preventive services.     Review of Systems     Objective:   Physical Exam  BP 124/74 mmHg  Pulse 86  Resp 16  Ht 5\' 6"  (1.676 m)  Wt 141 lb (63.957 kg)  BMI 22.77 kg/m2  SpO2 98%       Assessment & Plan:  Medicare annual wellness visit, subsequent Annual exam as documented. Counseling done  re healthy lifestyle involving commitment to 150 minutes exercise per week, heart healthy diet, and attaining  healthy weight.The importance of adequate sleep also discussed. Regular seat belt use and home safety, is also discussed. Changes in health habits are decided on by the patient with goals and time frames  set for achieving them. Immunization and cancer screening needs are specifically addressed at this visit.         Nicotine dependence Patient counseled for approximately 5 minutes regarding the health  risks of ongoing nicotine use, specifically all types of cancer, heart disease, stroke and respiratory failure. The options available for help with cessation ,the behavioral changes to assist the process, and the option to either gradully reduce usage  Or abruptly stop.is also discussed. Pt is also encouraged to set specific goals in number of cigarettes used daily, as well as to set a quit date.  Number of cigarettes/cigars currently smoking daily:3

## 2015-02-26 NOTE — Patient Instructions (Signed)
F/u in 5 month, call if you need me sooner  Fasting lipid and vit D in 5 month  TAKE gabapentin 3 times daily as prescribed by your stomach Doc , this will reduce headaches, and use tylenol Extra strength for headaches. Call back if they continue to be a problem   Schedule and keep appointments with mental health and your stomach Doc, also keep appts with Dr Glo Herring  Needs to STOP smoking to improve your health so please try to commit to this  Thanks for choosing [facility], we consider it a privelige to serve you.

## 2015-02-26 NOTE — Assessment & Plan Note (Addendum)

## 2015-03-04 ENCOUNTER — Encounter (HOSPITAL_BASED_OUTPATIENT_CLINIC_OR_DEPARTMENT_OTHER): Payer: Medicare Other

## 2015-03-04 VITALS — BP 115/72 | HR 62 | Temp 97.8°F | Resp 18

## 2015-03-04 DIAGNOSIS — D500 Iron deficiency anemia secondary to blood loss (chronic): Secondary | ICD-10-CM

## 2015-03-04 DIAGNOSIS — D649 Anemia, unspecified: Secondary | ICD-10-CM

## 2015-03-04 MED ORDER — NA FERRIC GLUC CPLX IN SUCROSE 12.5 MG/ML IV SOLN
125.0000 mg | Freq: Once | INTRAVENOUS | Status: AC
  Administered 2015-03-04: 125 mg via INTRAVENOUS
  Filled 2015-03-04: qty 10

## 2015-03-04 MED ORDER — SODIUM CHLORIDE 0.9 % IV SOLN
INTRAVENOUS | Status: DC
  Administered 2015-03-04: 14:00:00 via INTRAVENOUS

## 2015-03-04 NOTE — Progress Notes (Signed)
Tolerated iron infusion well. Ambulatory on discharge home to self. 

## 2015-03-04 NOTE — Patient Instructions (Signed)
[facility] at [facility] Discharge Instructions  RECOMMENDATIONS MADE BY THE CONSULTANT AND ANY TEST RESULTS WILL BE SENT TO YOUR REFERRING PHYSICIAN.  Ferric gluconate 125 mg iron infusion given today as ordered. Return as scheduled.  Thank you for choosing [facility] at [facility] to provide your oncology and hematology care.  To afford each patient quality time with our provider, please arrive at least 15 minutes before your scheduled appointment time.   Beginning January 23rd 2017 lab work for the Cancer Center will be done in the  Main lab at [place] on 1st floor. If you have a lab appointment with the Cancer Center please come in thru the  Main Entrance and check in at the main information desk  You need to re-schedule your appointment should you arrive 10 or more minutes late.  We strive to give you quality time with our providers, and arriving late affects you and other patients whose appointments are after yours.  Also, if you no show three or more times for appointments you may be dismissed from the clinic at the providers discretion.     Again, thank you for choosing [facility].  Our hope is that these requests will decrease the amount of time that you wait before being seen by our physicians.       _____________________________________________________________  Should you have questions after your visit to [facility], please contact our office at [phone] between the hours of 8:30 a.m. and 4:30 p.m.  Voicemails left after 4:30 p.m. will not be returned until the following business day.  For prescription refill requests, have your pharmacy contact our office.    

## 2015-03-06 ENCOUNTER — Ambulatory Visit: Payer: Medicare Other | Admitting: Family Medicine

## 2015-03-28 ENCOUNTER — Other Ambulatory Visit (HOSPITAL_COMMUNITY): Payer: Self-pay | Admitting: *Deleted

## 2015-03-28 DIAGNOSIS — D500 Iron deficiency anemia secondary to blood loss (chronic): Secondary | ICD-10-CM

## 2015-03-29 ENCOUNTER — Encounter (HOSPITAL_COMMUNITY): Payer: Commercial Managed Care - HMO | Attending: Oncology

## 2015-03-29 DIAGNOSIS — D649 Anemia, unspecified: Secondary | ICD-10-CM | POA: Insufficient documentation

## 2015-03-29 DIAGNOSIS — D500 Iron deficiency anemia secondary to blood loss (chronic): Secondary | ICD-10-CM

## 2015-03-29 LAB — CBC
HCT: 36.6 % (ref 36.0–46.0)
HEMOGLOBIN: 12 g/dL (ref 12.0–15.0)
MCH: 29.8 pg (ref 26.0–34.0)
MCHC: 32.8 g/dL (ref 30.0–36.0)
MCV: 90.8 fL (ref 78.0–100.0)
Platelets: 170 10*3/uL (ref 150–400)
RBC: 4.03 MIL/uL (ref 3.87–5.11)
RDW: 14.2 % (ref 11.5–15.5)
WBC: 7.4 10*3/uL (ref 4.0–10.5)

## 2015-03-29 LAB — FERRITIN: Ferritin: 151 ng/mL (ref 11–307)

## 2015-04-02 ENCOUNTER — Telehealth: Payer: Self-pay | Admitting: Internal Medicine

## 2015-04-02 DIAGNOSIS — K219 Gastro-esophageal reflux disease without esophagitis: Secondary | ICD-10-CM

## 2015-04-02 NOTE — Telephone Encounter (Signed)
Pt needs a prescription of dexilant called into [facility] on [address] in [place]

## 2015-04-02 NOTE — Telephone Encounter (Signed)
Routing to the refill box. 

## 2015-04-03 DIAGNOSIS — Z79899 Other long term (current) drug therapy: Secondary | ICD-10-CM | POA: Diagnosis not present

## 2015-04-03 DIAGNOSIS — M4696 Unspecified inflammatory spondylopathy, lumbar region: Secondary | ICD-10-CM | POA: Diagnosis not present

## 2015-04-03 DIAGNOSIS — Z79891 Long term (current) use of opiate analgesic: Secondary | ICD-10-CM | POA: Diagnosis not present

## 2015-04-03 DIAGNOSIS — Z5181 Encounter for therapeutic drug level monitoring: Secondary | ICD-10-CM | POA: Diagnosis not present

## 2015-04-03 MED ORDER — DEXLANSOPRAZOLE 60 MG PO CPDR: 60.0000 mg | DELAYED_RELEASE_CAPSULE | Freq: Every day | ORAL | Status: DC

## 2015-04-03 NOTE — Telephone Encounter (Signed)
Please notify patient Rx sent to pharmacy. 

## 2015-04-03 NOTE — Addendum Note (Signed)
Addended by: Gordy Levan, ERIC A on: 04/03/2015 08:23 AM   Modules accepted: Orders

## 2015-04-03 NOTE — Telephone Encounter (Signed)
Pt is aware.  

## 2015-04-18 ENCOUNTER — Other Ambulatory Visit: Payer: Self-pay | Admitting: Gastroenterology

## 2015-04-18 ENCOUNTER — Emergency Department (HOSPITAL_COMMUNITY)
Admission: EM | Admit: 2015-04-18 | Discharge: 2015-04-18 | Disposition: A | Discharge status: Home / Self Care | Payer: Commercial Managed Care - HMO | Attending: Emergency Medicine | Admitting: Emergency Medicine

## 2015-04-18 ENCOUNTER — Encounter: Payer: Self-pay | Admitting: Internal Medicine

## 2015-04-18 ENCOUNTER — Emergency Department (HOSPITAL_COMMUNITY): Payer: Commercial Managed Care - HMO

## 2015-04-18 ENCOUNTER — Telehealth: Payer: Self-pay | Admitting: Gastroenterology

## 2015-04-18 ENCOUNTER — Encounter (HOSPITAL_COMMUNITY): Payer: Self-pay | Admitting: Emergency Medicine

## 2015-04-18 DIAGNOSIS — S99922A Unspecified injury of left foot, initial encounter: Secondary | ICD-10-CM | POA: Diagnosis present

## 2015-04-18 DIAGNOSIS — Y9289 Other specified places as the place of occurrence of the external cause: Secondary | ICD-10-CM | POA: Diagnosis not present

## 2015-04-18 DIAGNOSIS — G8929 Other chronic pain: Secondary | ICD-10-CM | POA: Diagnosis not present

## 2015-04-18 DIAGNOSIS — F17210 Nicotine dependence, cigarettes, uncomplicated: Secondary | ICD-10-CM | POA: Insufficient documentation

## 2015-04-18 DIAGNOSIS — Z91040 Latex allergy status: Secondary | ICD-10-CM | POA: Insufficient documentation

## 2015-04-18 DIAGNOSIS — J449 Chronic obstructive pulmonary disease, unspecified: Secondary | ICD-10-CM | POA: Insufficient documentation

## 2015-04-18 DIAGNOSIS — Y998 Other external cause status: Secondary | ICD-10-CM | POA: Insufficient documentation

## 2015-04-18 DIAGNOSIS — G43909 Migraine, unspecified, not intractable, without status migrainosus: Secondary | ICD-10-CM | POA: Diagnosis not present

## 2015-04-18 DIAGNOSIS — Z862 Personal history of diseases of the blood and blood-forming organs and certain disorders involving the immune mechanism: Secondary | ICD-10-CM | POA: Diagnosis not present

## 2015-04-18 DIAGNOSIS — Y9389 Activity, other specified: Secondary | ICD-10-CM | POA: Diagnosis not present

## 2015-04-18 DIAGNOSIS — Z8711 Personal history of peptic ulcer disease: Secondary | ICD-10-CM | POA: Insufficient documentation

## 2015-04-18 DIAGNOSIS — Z872 Personal history of diseases of the skin and subcutaneous tissue: Secondary | ICD-10-CM | POA: Diagnosis not present

## 2015-04-18 DIAGNOSIS — E559 Vitamin D deficiency, unspecified: Secondary | ICD-10-CM | POA: Insufficient documentation

## 2015-04-18 DIAGNOSIS — K5900 Constipation, unspecified: Secondary | ICD-10-CM | POA: Diagnosis not present

## 2015-04-18 DIAGNOSIS — Z79899 Other long term (current) drug therapy: Secondary | ICD-10-CM | POA: Insufficient documentation

## 2015-04-18 DIAGNOSIS — S90122A Contusion of left lesser toe(s) without damage to nail, initial encounter: Secondary | ICD-10-CM | POA: Diagnosis not present

## 2015-04-18 DIAGNOSIS — F419 Anxiety disorder, unspecified: Secondary | ICD-10-CM | POA: Insufficient documentation

## 2015-04-18 DIAGNOSIS — S92412A Displaced fracture of proximal phalanx of left great toe, initial encounter for closed fracture: Secondary | ICD-10-CM | POA: Diagnosis not present

## 2015-04-18 DIAGNOSIS — Z7951 Long term (current) use of inhaled steroids: Secondary | ICD-10-CM | POA: Diagnosis not present

## 2015-04-18 DIAGNOSIS — S92912A Unspecified fracture of left toe(s), initial encounter for closed fracture: Secondary | ICD-10-CM

## 2015-04-18 DIAGNOSIS — M810 Age-related osteoporosis without current pathological fracture: Secondary | ICD-10-CM | POA: Diagnosis not present

## 2015-04-18 DIAGNOSIS — S92512A Displaced fracture of proximal phalanx of left lesser toe(s), initial encounter for closed fracture: Secondary | ICD-10-CM | POA: Diagnosis not present

## 2015-04-18 DIAGNOSIS — Z880 Allergy status to penicillin: Secondary | ICD-10-CM | POA: Diagnosis not present

## 2015-04-18 DIAGNOSIS — W2203XA Walked into furniture, initial encounter: Secondary | ICD-10-CM | POA: Insufficient documentation

## 2015-04-18 MED ORDER — ACETAMINOPHEN 325 MG PO TABS
650.0000 mg | ORAL_TABLET | Freq: Once | ORAL | Status: AC
  Administered 2015-04-18: 650 mg via ORAL
  Filled 2015-04-18: qty 2

## 2015-04-18 MED ORDER — HYDROCODONE-ACETAMINOPHEN 5-325 MG PO TABS: 1.0000 | ORAL_TABLET | ORAL | Status: DC | PRN

## 2015-04-18 NOTE — Telephone Encounter (Signed)
Patient is due for follow up OV. H/o recurrent pylorospasm requiring Botox. FYI: if patient requires further Botox injections, Dr. Gala Romney wants Dr. Laural Golden to do locally so he can assist. We can discuss at [place].

## 2015-04-18 NOTE — ED Provider Notes (Signed)
CSN: [id number]     Arrival date & time 04/18/15  0111 History   First MD Initiated Contact with Patient 04/18/15 0127     Chief Complaint  Patient presents with  . Toe Injury    HPI   Tracey Daniel is a 35 y.o. female with a PMH of migraines, COPD, depression who presents to the ED with left 4th toe pain, which she states started tonight after she hit her toe on a dresser. She reports constant pain since that time. She states movement and bearing weight exacerbate her pain. She has not tried anything for symptom relief. She denies numbness, weakness, paresthesia.   Past Medical History  Diagnosis Date  . Migraines   . Osteoporosis   . Seasonal allergies   . Sinusitis   . Back pain, chronic   . Nicotine addiction   . Constipation   . Vitamin D deficiency   . Gastritis   . Gastroparesis   . Pyloric spasm 03/30/2011  . Gastric outlet obstruction   . COPD (chronic obstructive pulmonary disease) ([place])   . Asthma   . Substance abuse 2008    marijuana  . Peptic ulcer disease   . Depression 2000    h/o suicidal ideation  . Anemia of other chronic disease 11/29/2012  . Chronic abdominal pain   . Nausea and vomiting     recurrent  . Cyst of skin     mid spine area  . Migraine    Past Surgical History  Procedure Laterality Date  . Cholecystectomy      ?2002  . Laser surgery on cervix    . Carpal tunnel release      left hand  . Esophagogastroduodenoscopy  12/25/2010    Dorothyann Peng, MD;  moderate gastritis, ?goo secondary to pylorspasm. BX showed reactive gstropathy no h.pyori, SB mucosa with intramucosal lymphocytosis and partial villous blunting (TTG 4.0 normal)  . Esophageal dilation  12/25/2010    Procedure: ESOPHAGEAL DILATION;  Surgeon: Dorothyann Peng, MD;  [facility];  Service: Endoscopy;;  . Esophagogastroduodenoscopy N/A 11/14/2012    [id number] hiatal hernia. Abnormal gastric mucosa of  uncertain significance-status post biopsy. Subjectively,  patient may have recurrent symptomatic, pylorospasm.  . Esophagogastroduodenoscopy (egd) with propofol N/A 02/09/2013    elongated stomach, partial lower esophageal ring widely patent. No obvious pyloric stenosis s/p Botox  . Botox injection N/A 02/09/2013    Procedure: BOTOX INJECTION;  Surgeon: Missy Sabins, MD;  Location: [facility];  Service: Endoscopy;  Laterality: N/A;  possible balloon  . Balloon dilation N/A 02/09/2013    Procedure: BALLOON DILATION;  Surgeon: Missy Sabins, MD;  Location: [facility];  Service: Endoscopy;  Laterality: N/A;  . Colonoscopy with propofol N/A 09/20/2014    RMR: Normal ileo-colonoscopy   Family History  Problem Relation Age of Onset  . Diabetes Father   . Liver disease Father     liver transplant at [facility], age 54  . Colon cancer Neg Hx   . Lung cancer Mother 6  . Heart disease Maternal Grandmother   . Parkinson's disease Maternal Grandfather   . Multiple sclerosis Sister 51  . Depression Sister 16    bipolar and schizophrenic  . Alcohol abuse Sister    Social History  Substance Use Topics  . Smoking status: Current Every Day Smoker -- 0.10 packs/day for 15 years    Types: Cigarettes  . Smokeless tobacco: Never Used     Comment: smokes two  cigarette daily  . Alcohol Use: No     Comment: none since July 2016. Periodic heavy drinker for months at a time.   OB History    Gravida Para Term Preterm AB TAB SAB Ectopic Multiple Living   1              Review of Systems  Musculoskeletal: Positive for arthralgias.  Skin: Negative for wound.  Neurological: Negative for weakness and numbness.      Allergies  Benadryl; Latex; and Penicillins  Home Medications   Prior to Admission medications   Medication Sig Start Date End Date Taking? Authorizing Provider  albuterol (PROVENTIL HFA;VENTOLIN HFA) 108 (90 BASE) MCG/ACT inhaler Inhale 2 puffs into the lungs every 6 (six) hours as needed. Shortness of breath Patient taking differently: Inhale 2  puffs into the lungs every 6 (six) hours as needed for wheezing or shortness of breath. Shortness of breath 11/14/12 05/27/15  [name], MD  budesonide-formoterol [facility]) 80-4.5 MCG/ACT inhaler Inhale 2 puffs into the lungs 2 (two) times daily. Patient taking differently: Inhale 2 puffs into the lungs every evening.  11/14/12 05/27/15  [name], MD  calcium-vitamin D (OSCAL WITH D) 500-200 MG-UNIT per tablet Take 1 tablet by mouth 2 (two) times daily.    Historical Provider, MD  clobetasol cream (TEMOVATE) [id number] % Apply 1 application topically 2 (two) times daily as needed (rash). Apply to rash    Historical Provider, MD  dexlansoprazole (DEXILANT) 60 MG capsule Take 1 capsule (60 mg total) by mouth daily. 04/03/15   Carlis Stable, NP  DULoxetine (CYMBALTA) 30 MG capsule Take 1 capsule (30 mg total) by mouth daily. 05/16/14   [name], MD  ergocalciferol (VITAMIN D2) 50000 UNITS capsule Take 1 capsule (50,000 Units total) by mouth once a week. One capsule once weekly 05/16/14   [name], MD  estradiol (ESTRACE VAGINAL) 0.1 MG/GM vaginal cream Place 2 g vaginally at bedtime. As directed    Historical Provider, MD  feeding supplement (ENSURE CLINICAL STRENGTH) LIQD Take 237 mLs by mouth 3 (three) times daily with meals. Patient taking differently: Take 237 mLs by mouth 2 (two) times daily.  01/01/11   Nishant Dhungel, MD  gabapentin (NEURONTIN) 100 MG capsule Take 100 mg by mouth 3 (three) times daily.    Historical Provider, MD  HYDROcodone-acetaminophen (NORCO/VICODIN) 5-325 MG tablet Take 1 tablet by mouth every 4 (four) hours as needed. 04/18/15   Marella Chimes, PA-C  Linaclotide (LINZESS) 290 MCG CAPS capsule Take 1 capsule (290 mcg total) by mouth daily. 08/27/14   Mahala Menghini, PA-C  loratadine (CLARITIN) 10 MG tablet Take 1 tablet (10 mg total) by mouth daily. 10/09/11   [name], MD  mometasone (NASONEX) 50 MCG/ACT nasal spray Place 2 sprays into  the nose 2 (two) times daily. 05/16/14   [name], MD  NUCYNTA 50 MG TABS tablet Take 50 mg by mouth every 8 (eight) hours as needed for moderate pain.  08/27/14   Historical Provider, MD  pantoprazole (PROTONIX) 40 MG tablet Take 40 mg by mouth 2 (two) times daily.    Historical Provider, MD  potassium chloride SA (K-DUR,KLOR-CON) 20 MEQ tablet Take 1 tablet (20 mEq total) by mouth daily. 02/19/15   Domenic Moras, PA-C  promethazine (PHENERGAN) 25 MG tablet Take 1 tablet (25 mg total) by mouth every 6 (six) hours as needed for nausea or vomiting. 02/19/15   Domenic Moras, PA-C  raloxifene (EVISTA) 60 MG tablet Take 1 tablet (60 mg total) by mouth daily. 02/11/15   Jonnie Kind, MD  sucralfate (CARAFATE) 1 G tablet Crush one tablet and mix in applesauce, take every morning and at bedtime as needed for stomach burning. 08/27/14   Mahala Menghini, PA-C  tiZANidine (ZANAFLEX) 4 MG tablet Take 4 mg by mouth every 6 (six) hours as needed for muscle spasms.  10/27/13   Historical Provider, MD    BP 144/85 mmHg  Pulse 108  Temp(Src) 98.3 F (36.8 C) (Oral)  Resp 20  Ht 5\' 6"  (1.676 m)  Wt 63.05 kg  BMI 22.45 kg/m2  SpO2 100% Physical Exam  Constitutional: She is oriented to person, place, and time. She appears well-developed and well-nourished. No distress.  HENT:  Head: Normocephalic and atraumatic.  Right Ear: External ear normal.  Left Ear: External ear normal.  Nose: Nose normal.  Eyes: Conjunctivae and EOM are normal. Right eye exhibits no discharge. Left eye exhibits no discharge. No scleral icterus.  Neck: Normal range of motion. Neck supple.  Cardiovascular: Normal rate, regular rhythm and intact distal pulses.   Pulmonary/Chest: Effort normal and breath sounds normal. No respiratory distress.  Musculoskeletal: Normal range of motion. She exhibits edema and tenderness.       Feet:  Mild edema, ecchymosis, and tenderness to palpation to left fourth toe over MCP. Cap refill < 3 seconds.    Neurological: She is alert and oriented to person, place, and time. She has normal strength. No sensory deficit.  Skin: Skin is warm and dry. She is not diaphoretic.  Psychiatric: She has a normal mood and affect. Her behavior is normal.  Nursing note and vitals reviewed.   ED Course  Procedures (including critical care time)  Labs Review Labs Reviewed - No data to display  Imaging Review No results found. I have personally reviewed and evaluated these images and lab results as part of my medical decision-making.   EKG Interpretation None      MDM   Final diagnoses:  Toe fracture, left, closed, initial encounter    34 year old female presents with left fourth toe injury. States she hit her toe on a [employer], fashion/clothing. Denies numbness, weakness, paresthesia. Patient is afebrile. On exam, she has edema, ecchymosis, and tenderness to palpation to her left fourth toe over MCP. Patient is neurovascularly intact. Will obtain imaging of left fourth toe. Patient given tylenol and ice for symptoms.  Imaging remarkable for mildly displaced oblique fracture through the distal aspect of the 4th proximal phalanx with mild lateral and dorsal displacement and shortening at the fracture site. Discussed findings with patient. Toes buddy taped. Will place in hard shoe and give crutches. Will give short course of pain medication for home. Advised to rest, ice, and elevate. Patient to follow-up with orthopedics. Return precautions discussed. Patient verbalizes her understanding and is in agreement with plan.  BP 144/85 mmHg  Pulse 108  Temp(Src) 98.3 F (36.8 C) (Oral)  Resp 20  Ht 5\' 6"  (1.676 m)  Wt 63.05 kg  BMI 22.45 kg/m2  SpO2 100%     Marella Chimes, PA-C 04/18/15 [place], MD 04/19/15 [phone]

## 2015-04-18 NOTE — Telephone Encounter (Signed)
APPT MADE AND LETTER SENT  °

## 2015-04-18 NOTE — Discharge Instructions (Signed)
1. Medications: pain medicine, usual home medications 2. Treatment: rest, drink plenty of fluids, ice, elevate, wear hard shoe 3. Follow Up: please followup with orthopedics for discussion of your diagnoses and further evaluation after today's visit; if you do not have a primary care doctor use the phone number listed in your discharge paperwork to find one; please return to the ER for increased pain, numbness, new or worsening symptoms   Toe Fracture A toe fracture is a break in one of the toe bones (phalanges). HOME CARE If You Have a Cast:  Do not stick anything inside the cast to scratch your skin.  Check the skin around the cast every day. Tell your doctor about any concerns. Do not put lotion on the skin underneath the cast. You may put lotion on dry skin around the edges of the cast.  Do not put pressure on any part of the cast until it is fully hardened. This may take many hours.  Keep the cast clean and dry. Bathing  Do not take baths, swim, or use a hot tub until your doctor says that you can. Ask your doctor if you can take showers. You may only be allowed to take sponge baths for bathing.  If your doctor says that bathing and showering are okay, cover the cast or bandage (dressing) with a watertight plastic bag to protect it from water. Do not let the cast or bandage get wet. Managing Pain, Stiffness, and Swelling  If you do not have a cast, put ice on the injured area if told by your doctor:  Put ice in a plastic bag.  Place a towel between your skin and the bag.  Leave the ice on for 20 minutes, 2-3 times per day.  Move your toes often to avoid stiffness and to lessen swelling.  Raise (elevate) the injured area above the level of your heart while you are sitting or lying down. Driving  Do not drive or use heavy machinery while taking pain medicine.  Do not drive while wearing a cast on a foot that you use for driving. Activity  Return to your normal activities  as told by your doctor. Ask your doctor what activities are safe for you.  Perform exercises daily as told by your doctor or therapist. Safety  Do not use your leg to support your body weight until your doctor says that you can. Use crutches or other tools to help you move around as told by your doctor. General Instructions  If your toe was taped to a toe that is next to it (buddy taping), follow your doctor's instructions for changing the gauze and tape. Change it more often:  If the gauze and tape get wet. If this happens, dry the space between the toes.  If the gauze and tape are too tight and they cause your toe to become pale or to lose feeling (numb).  Wear a protective shoe as told by your doctor. If you were not given one, wear sturdy shoes that support your foot. Your shoes should not pinch your toes. Your shoes should not fit tightly against your toes.  Do not use any tobacco products, including cigarettes, chewing tobacco, or e-cigarettes. Tobacco can delay bone healing. If you need help quitting, ask your doctor.  Take medicines only as told by your doctor.  Keep all follow-up visits as told by your doctor. This is important. GET HELP IF:  You have a fever.  Your pain medicine is not helping.  Your toe feels cold.  You lose feeling (have numbness) in your toe.  You still have pain after one week of rest and treatment.  You still have pain after your doctor has said that you can start walking again.  You have pain or tingling in your foot, and it is not going away.  You have loss of feeling in your foot, and it is not going away. GET HELP RIGHT AWAY IF:  You have severe pain.  You have redness or swelling (inflammation) in your toe, and it is getting worse.  You have pain or loss of feeling in your toe, and it is getting worse.  Your toe is blue.   This information is not intended to replace advice given to you by your health care provider. Make sure you  discuss any questions you have with your health care provider.   Document Released: 06/24/2007 Document Revised: 05/22/2014 Document Reviewed: 11/01/2013 Elsevier Interactive Patient Education [employer].

## 2015-04-18 NOTE — ED Notes (Signed)
Pt. reports injury to left 4th toe yesterday with pain and mild swelling .

## 2015-04-19 DIAGNOSIS — S92515A Nondisplaced fracture of proximal phalanx of left lesser toe(s), initial encounter for closed fracture: Secondary | ICD-10-CM | POA: Diagnosis not present

## 2015-04-26 DIAGNOSIS — S92515D Nondisplaced fracture of proximal phalanx of left lesser toe(s), subsequent encounter for fracture with routine healing: Secondary | ICD-10-CM | POA: Diagnosis not present

## 2015-04-30 DIAGNOSIS — Y999 Unspecified external cause status: Secondary | ICD-10-CM | POA: Diagnosis not present

## 2015-04-30 DIAGNOSIS — S92512A Displaced fracture of proximal phalanx of left lesser toe(s), initial encounter for closed fracture: Secondary | ICD-10-CM | POA: Diagnosis not present

## 2015-04-30 DIAGNOSIS — X58XXXA Exposure to other specified factors, initial encounter: Secondary | ICD-10-CM | POA: Diagnosis not present

## 2015-04-30 DIAGNOSIS — S92302A Fracture of unspecified metatarsal bone(s), left foot, initial encounter for closed fracture: Secondary | ICD-10-CM | POA: Diagnosis not present

## 2015-04-30 DIAGNOSIS — Y939 Activity, unspecified: Secondary | ICD-10-CM | POA: Diagnosis not present

## 2015-04-30 DIAGNOSIS — Y929 Unspecified place or not applicable: Secondary | ICD-10-CM | POA: Diagnosis not present

## 2015-05-06 ENCOUNTER — Ambulatory Visit: Payer: Medicare Other | Admitting: Obstetrics and Gynecology

## 2015-05-08 ENCOUNTER — Ambulatory Visit (INDEPENDENT_AMBULATORY_CARE_PROVIDER_SITE_OTHER): Payer: Commercial Managed Care - HMO | Admitting: Obstetrics and Gynecology

## 2015-05-08 ENCOUNTER — Encounter: Payer: Self-pay | Admitting: Obstetrics and Gynecology

## 2015-05-08 ENCOUNTER — Other Ambulatory Visit: Payer: Self-pay | Admitting: Obstetrics and Gynecology

## 2015-05-08 VITALS — BP 120/70 | Ht 66.0 in | Wt 143.0 lb

## 2015-05-08 DIAGNOSIS — N951 Menopausal and female climacteric states: Secondary | ICD-10-CM | POA: Diagnosis not present

## 2015-05-08 DIAGNOSIS — E288 Other ovarian dysfunction: Secondary | ICD-10-CM | POA: Diagnosis not present

## 2015-05-08 DIAGNOSIS — E2839 Other primary ovarian failure: Secondary | ICD-10-CM

## 2015-05-08 MED ORDER — PROGESTERONE MICRONIZED 200 MG PO CAPS: 200.0000 mg | ORAL_CAPSULE | Freq: Every day | ORAL | Status: DC

## 2015-05-08 MED ORDER — ESTRADIOL 1 MG PO TABS: 1.0000 mg | ORAL_TABLET | Freq: Every day | ORAL | Status: DC

## 2015-05-08 NOTE — Progress Notes (Signed)
Patient ID: Tracey Daniel, female   DOB: 04-22-1980, 35 y.o.   MRN: [id number] Pt here today for follow up. Pt to discuss medication that was given to her at her last visit and get a refill on that and to discuss hot flashes and night sweats.

## 2015-05-08 NOTE — Progress Notes (Addendum)
[facility] Visit  Patient name: Tracey Daniel MRN [id number]  Date of birth: 1980-11-26  CC & HPI:  Tracey Daniel is a 35 y.o. female presenting today for follow-up after being seen 02/11/15 for premature ovarian failure. She was started on estrace vaginal cream for atrophic vaginitis but complains of episodes of hot flashes and diaphoresis occurring nightly, stating that she wakes up in the morning with her bed sheets wet. She denies a history of CAD, CVA, breast cancer, or blood clots.  She reports she has had premature ovarian failure since age 21. Patient reports a history of osteoporosis, as well as CNS production of low level hCG. She finds the hot sweats uncomfortable q night. No relief with Raloxifene, perhaps worse.  ROS:  Review of Systems  Constitutional: Positive for diaphoresis.       Positive for hot flashes.   pos for anemia requiring HT.  Pertinent History Reviewed:   Reviewed: Significant for premature ovarian failure, osteoporosis, and CNS production of low level hCG.  Medical         Past Medical History  Diagnosis Date  . Migraines   . Osteoporosis   . Seasonal allergies   . Sinusitis   . Back pain, chronic   . Nicotine addiction   . Constipation   . Vitamin D deficiency   . Gastritis   . Gastroparesis   . Pyloric spasm 03/30/2011  . Gastric outlet obstruction   . COPD (chronic obstructive pulmonary disease) ([place])   . Asthma   . Substance abuse 2008    marijuana  . Peptic ulcer disease   . Depression 2000    h/o suicidal ideation  . Anemia of other chronic disease 11/29/2012  . Chronic abdominal pain   . Nausea and vomiting     recurrent  . Cyst of skin     mid spine area  . Migraine                               Surgical Hx:    Past Surgical History  Procedure Laterality Date  . Cholecystectomy      ?2002  . Laser surgery on cervix    . Carpal tunnel release      left hand  . Esophagogastroduodenoscopy  12/25/2010     Dorothyann Peng, MD;  moderate gastritis, ?goo secondary to pylorspasm. BX showed reactive gstropathy no h.pyori, SB mucosa with intramucosal lymphocytosis and partial villous blunting (TTG 4.0 normal)  . Esophageal dilation  12/25/2010    Procedure: ESOPHAGEAL DILATION;  Surgeon: Dorothyann Peng, MD;  [facility];  Service: Endoscopy;;  . Esophagogastroduodenoscopy N/A 11/14/2012    [id number] hiatal hernia. Abnormal gastric mucosa of  uncertain significance-status post biopsy. Subjectively, patient may have recurrent symptomatic, pylorospasm.  . Esophagogastroduodenoscopy (egd) with propofol N/A 02/09/2013    elongated stomach, partial lower esophageal ring widely patent. No obvious pyloric stenosis s/p Botox  . Botox injection N/A 02/09/2013    Procedure: BOTOX INJECTION;  Surgeon: Missy Sabins, MD;  [facility];  Service: Endoscopy;  Laterality: N/A;  possible balloon  . Balloon dilation N/A 02/09/2013    Procedure: BALLOON DILATION;  Surgeon: Missy Sabins, MD;  [facility];  Service: Endoscopy;  Laterality: N/A;  . Colonoscopy with propofol N/A 09/20/2014    RMR: Normal ileo-colonoscopy  . Toe surgery     Medications: Reviewed & Updated -  see associated section                       Current outpatient prescriptions:  .  albuterol (PROVENTIL HFA;VENTOLIN HFA) 108 (90 BASE) MCG/ACT inhaler, Inhale 2 puffs into the lungs every 6 (six) hours as needed. Shortness of breath (Patient taking differently: Inhale 2 puffs into the lungs every 6 (six) hours as needed for wheezing or shortness of breath. Shortness of breath), Disp: 6.7 g, Rfl: 4 .  budesonide-formoterol (SYMBICORT) 80-4.5 MCG/ACT inhaler, Inhale 2 puffs into the lungs 2 (two) times daily. (Patient taking differently: Inhale 2 puffs into the lungs every evening. ), Disp: 1 Inhaler, Rfl: 4 .  calcium-vitamin D (OSCAL WITH D) 500-200 MG-UNIT per tablet, Take 1 tablet by mouth 2 (two) times daily., Disp: , Rfl:  .   clobetasol cream (TEMOVATE) [id number] %, Apply 1 application topically 2 (two) times daily as needed (rash). Apply to rash, Disp: , Rfl:  .  dexlansoprazole (DEXILANT) 60 MG capsule, Take 1 capsule (60 mg total) by mouth daily., Disp: 90 capsule, Rfl: 3 .  DULoxetine (CYMBALTA) 30 MG capsule, Take 1 capsule (30 mg total) by mouth daily., Disp: 30 capsule, Rfl: 3 .  ergocalciferol (VITAMIN D2) 50000 UNITS capsule, Take 1 capsule (50,000 Units total) by mouth once a week. One capsule once weekly, Disp: 12 capsule, Rfl: 1 .  estradiol (ESTRACE VAGINAL) 0.1 MG/GM vaginal cream, Place 2 g vaginally at bedtime. As directed, Disp: , Rfl:  .  feeding supplement (ENSURE CLINICAL STRENGTH) LIQD, Take 237 mLs by mouth 3 (three) times daily with meals. (Patient taking differently: Take 237 mLs by mouth 2 (two) times daily. ), Disp: 10 Bottle, Rfl: 3 .  gabapentin (NEURONTIN) 100 MG capsule, Take 100 mg by mouth 3 (three) times daily., Disp: , Rfl:  .  HYDROcodone-acetaminophen (NORCO/VICODIN) 5-325 MG tablet, Take 1 tablet by mouth every 4 (four) hours as needed., Disp: 12 tablet, Rfl: 0 .  Linaclotide (LINZESS) 290 MCG CAPS capsule, Take 1 capsule (290 mcg total) by mouth daily., Disp: 30 capsule, Rfl: 5 .  loratadine (CLARITIN) 10 MG tablet, Take 1 tablet (10 mg total) by mouth daily., Disp: 30 tablet, Rfl: 5 .  mometasone (NASONEX) 50 MCG/ACT nasal spray, Place 2 sprays into the nose 2 (two) times daily., Disp: 17 g, Rfl: 12 .  pantoprazole (PROTONIX) 40 MG tablet, TAKE 1 TABLET BY MOUTH TWICE DAILY BEFORE A MEAL, Disp: 180 tablet, Rfl: 3 .  potassium chloride SA (K-DUR,KLOR-CON) 20 MEQ tablet, Take 1 tablet (20 mEq total) by mouth daily., Disp: 3 tablet, Rfl: 0 .  promethazine (PHENERGAN) 25 MG tablet, Take 1 tablet (25 mg total) by mouth every 6 (six) hours as needed for nausea or vomiting., Disp: 20 tablet, Rfl: 0 .  raloxifene (EVISTA) 60 MG tablet, Take 1 tablet (60 mg total) by mouth daily., Disp: 30 tablet,  Rfl: 11 .  sucralfate (CARAFATE) 1 G tablet, Crush one tablet and mix in applesauce, take every morning and at bedtime as needed for stomach burning., Disp: 60 tablet, Rfl: 3 .  tiZANidine (ZANAFLEX) 4 MG tablet, Take 4 mg by mouth every 6 (six) hours as needed for muscle spasms. , Disp: , Rfl:  .  NUCYNTA 50 MG TABS tablet, Take 50 mg by mouth every 8 (eight) hours as needed for moderate pain. Reported on 05/08/2015, Disp: , Rfl: 0   Social History: Reviewed -  reports that she has been smoking Cigarettes.  She has a 1.5 pack-year smoking history. She has never used smokeless tobacco.  Objective Findings:  Vitals: Blood pressure 120/70, height 5\' 6"  (1.676 m), weight 143 lb (64.864 kg).  Physical Examination: n/a  Discussed with pt risks and benefits of HRT. At end of discussion, pt had opportunity to ask questions and has no further questions at this time.   Greater than 50% was spent in counseling and coordination of care with the patient. Total time greater than: 25 minutes    Assessment & Plan:   A:  1. Follow-up of Premature ovarian failure ( POF)  2. Hot flashes and diaphoresis, due to POF, not tolerable on Raloxifene.  P:  1. Discontinue raloxifene. 2. Begin estradiol tablets (Estrace)1 mg daily and prometrium 200 mg x 2wk every third month(jan, April,july, October) 3. Follow up in July to evaluate for appearance of menses.   By signing my name below, I, Stephania Fragmin, attest that this documentation has been prepared under the direction and in the presence of Jonnie Kind, MD. Electronically Signed: Stephania Fragmin, ED Scribe. 05/08/2015. 3:33 PM.  I personally performed the services described in this documentation, which was SCRIBED in my presence. The recorded information has been reviewed and considered accurate. It has been edited as necessary during review. Jonnie Kind, MD

## 2015-05-10 DIAGNOSIS — S92515D Nondisplaced fracture of proximal phalanx of left lesser toe(s), subsequent encounter for fracture with routine healing: Secondary | ICD-10-CM | POA: Diagnosis not present

## 2015-05-10 DIAGNOSIS — S92302D Fracture of unspecified metatarsal bone(s), left foot, subsequent encounter for fracture with routine healing: Secondary | ICD-10-CM | POA: Diagnosis not present

## 2015-05-13 ENCOUNTER — Encounter: Payer: Self-pay | Admitting: Gastroenterology

## 2015-05-13 ENCOUNTER — Ambulatory Visit (INDEPENDENT_AMBULATORY_CARE_PROVIDER_SITE_OTHER): Payer: Medicare Other | Admitting: Gastroenterology

## 2015-05-13 ENCOUNTER — Encounter: Payer: Self-pay | Admitting: Internal Medicine

## 2015-05-13 DIAGNOSIS — K313 Pylorospasm, not elsewhere classified: Secondary | ICD-10-CM

## 2015-05-13 DIAGNOSIS — K5900 Constipation, unspecified: Secondary | ICD-10-CM | POA: Diagnosis not present

## 2015-05-13 NOTE — Patient Instructions (Signed)
1. If you develop recurrent abdominal pain, nausea and vomiting, please let us know. We can arrange for EGD with Botox injections locally with Dr. Laural Golden (with Dr. Gala Romney assisting). 2. Return to the office in six months.

## 2015-05-13 NOTE — Assessment & Plan Note (Signed)
Doing well on Linzess. Continue as before. Return to the office in 6 months.

## 2015-05-13 NOTE — Progress Notes (Signed)
CC'ED TO PCP 

## 2015-05-13 NOTE — Assessment & Plan Note (Signed)
History of pylorospasm with GFR requiring intermittent Botox injection. Last one in January 2015. Doing fairly well at this time. It appears that she has gotten both Dexilant and pantoprazole field recently. We are trying to sort that out. Discussed with patient, if she has recurrent symptoms regarding her pylorospasm we will plan on EGD with Botox injection here locally. Will discuss with Dr. Laural Golden see if he will help [place], Dr. Gala Romney to assist. Return to the office in six months or call sooner if needed.  Continue to follow with hematology regarding her anemia.

## 2015-05-13 NOTE — Progress Notes (Signed)
Primary Care Physician: [name], MD  Primary Gastroenterologist:  Garfield Cornea, MD   Chief Complaint  Patient presents with  . Follow-up    HPI: Tracey Daniel is a 35 y.o. female here for follow-up. She has a history of pyloric spasm with gastric outlet obstruction requiring Botox injections. Also history of constipation. Last Botox injection by Dr. Amedeo Plenty during hospitalization in [place] back in January 2015.Subsequently followed up with Dr.Koch at [facility] last year. Additional gastric emptying study normal at 2 and 4 hours. He referred her to pain clinic for abdominal wall syndrome/positive Carnett's sign. Patient had 100% relief of her abdominal pain status post celiac plexus block via splanchnic approach on 03/27/2014. CT abdomen pelvis in December 2016 with contrast unremarkable.  Iron infusions with hematology. Unexplained anemia. Overall she feels well. Only rare nausea/vomiting. Broke her toe since we last saw her. Require surgery. Bowel movements regular on Linzess. No blood in the stool or melena. It is unclear whether she is taking both Dexilant and protonic. We are trying to sort that out.    Current Outpatient Prescriptions  Medication Sig Dispense Refill  . albuterol (PROVENTIL HFA;VENTOLIN HFA) 108 (90 BASE) MCG/ACT inhaler Inhale 2 puffs into the lungs every 6 (six) hours as needed. Shortness of breath (Patient taking differently: Inhale 2 puffs into the lungs every 6 (six) hours as needed for wheezing or shortness of breath. Shortness of breath) 6.7 g 4  . budesonide-formoterol (SYMBICORT) 80-4.5 MCG/ACT inhaler Inhale 2 puffs into the lungs 2 (two) times daily. (Patient taking differently: Inhale 2 puffs into the lungs every evening. ) 1 Inhaler 4  . calcium-vitamin D (OSCAL WITH D) 500-200 MG-UNIT per tablet Take 1 tablet by mouth 2 (two) times daily.    . clobetasol cream (TEMOVATE) [id number] % Apply 1 application topically 2 (two) times daily as needed  (rash). Apply to rash    . dexlansoprazole (DEXILANT) 60 MG capsule Take 1 capsule (60 mg total) by mouth daily. 90 capsule 3  . DULoxetine (CYMBALTA) 30 MG capsule Take 1 capsule (30 mg total) by mouth daily. 30 capsule 3  . ergocalciferol (VITAMIN D2) 50000 UNITS capsule Take 1 capsule (50,000 Units total) by mouth once a week. One capsule once weekly 12 capsule 1  . estradiol (ESTRACE VAGINAL) 0.1 MG/GM vaginal cream Place 2 g vaginally at bedtime. As directed    . estradiol (ESTRACE) 1 MG tablet Take 1 tablet (1 mg total) by mouth daily. 30 tablet 11  . feeding supplement (ENSURE CLINICAL STRENGTH) LIQD Take 237 mLs by mouth 3 (three) times daily with meals. (Patient taking differently: Take 237 mLs by mouth 2 (two) times daily. ) 10 Bottle 3  . gabapentin (NEURONTIN) 100 MG capsule Take 100 mg by mouth 3 (three) times daily.    Marland Kitchen HYDROcodone-acetaminophen (NORCO/VICODIN) 5-325 MG tablet Take 1 tablet by mouth every 4 (four) hours as needed. 12 tablet 0  . Linaclotide (LINZESS) 290 MCG CAPS capsule Take 1 capsule (290 mcg total) by mouth daily. 30 capsule 5  . loratadine (CLARITIN) 10 MG tablet Take 1 tablet (10 mg total) by mouth daily. 30 tablet 5  . mometasone (NASONEX) 50 MCG/ACT nasal spray Place 2 sprays into the nose 2 (two) times daily. 17 g 12  . NUCYNTA 50 MG TABS tablet Take 50 mg by mouth every 8 (eight) hours as needed for moderate pain. Reported on 05/08/2015  0  . pantoprazole (PROTONIX) 40 MG tablet TAKE 1 TABLET  BY MOUTH TWICE DAILY BEFORE A MEAL 180 tablet 3  . potassium chloride SA (K-DUR,KLOR-CON) 20 MEQ tablet Take 1 tablet (20 mEq total) by mouth daily. 3 tablet 0  . progesterone (PROMETRIUM) 200 MG capsule TAKE 1 CAPSULE BY MOUTH EVERY DAY. EVERY THIRD MONTH, JANUARY, APRIL, JULY, AND OCTOBER FOR 2 WEEKS 14 capsule 3  . promethazine (PHENERGAN) 25 MG tablet Take 1 tablet (25 mg total) by mouth every 6 (six) hours as needed for nausea or vomiting. 20 tablet 0  . sucralfate  (CARAFATE) 1 G tablet Crush one tablet and mix in applesauce, take every morning and at bedtime as needed for stomach burning. 60 tablet 3  . tiZANidine (ZANAFLEX) 4 MG tablet Take 4 mg by mouth every 6 (six) hours as needed for muscle spasms.      No current facility-administered medications for this visit.    Allergies as of 05/13/2015 - Review Complete 05/13/2015  Allergen Reaction Noted  . Benadryl [diphenhydramine] Anaphylaxis 03/25/2014  . Latex Itching and Other (See Comments) 11/30/2011  . Penicillins     Past Medical History  Diagnosis Date  . Migraines   . Osteoporosis   . Seasonal allergies   . Sinusitis   . Back pain, chronic   . Nicotine addiction   . Constipation   . Vitamin D deficiency   . Gastritis   . Gastroparesis   . Pyloric spasm 03/30/2011  . Gastric outlet obstruction   . COPD (chronic obstructive pulmonary disease) ([place])   . Asthma   . Substance abuse 2008    marijuana  . Peptic ulcer disease   . Depression 2000    h/o suicidal ideation  . Anemia of other chronic disease 11/29/2012  . Chronic abdominal pain   . Nausea and vomiting     recurrent  . Cyst of skin     mid spine area  . Migraine    Past Surgical History  Procedure Laterality Date  . Cholecystectomy      ?2002  . Laser surgery on cervix    . Carpal tunnel release      left hand  . Esophagogastroduodenoscopy  12/25/2010    Dorothyann Peng, MD;  moderate gastritis, ?goo secondary to pylorspasm. BX showed reactive gstropathy no h.pyori, SB mucosa with intramucosal lymphocytosis and partial villous blunting (TTG 4.0 normal)  . Esophageal dilation  12/25/2010    Procedure: ESOPHAGEAL DILATION;  Surgeon: Dorothyann Peng, MD;  Location: [facility];  Service: Endoscopy;;  . Esophagogastroduodenoscopy N/A 11/14/2012    [id number] hiatal hernia. Abnormal gastric mucosa of  uncertain significance-status post biopsy. Subjectively, patient may have recurrent symptomatic, pylorospasm.  .  Esophagogastroduodenoscopy (egd) with propofol N/A 02/09/2013    elongated stomach, partial lower esophageal ring widely patent. No obvious pyloric stenosis s/p Botox  . Botox injection N/A 02/09/2013    Procedure: BOTOX INJECTION;  Surgeon: Missy Sabins, MD;  Location: [facility];  Service: Endoscopy;  Laterality: N/A;  possible balloon  . Balloon dilation N/A 02/09/2013    Procedure: BALLOON DILATION;  Surgeon: Missy Sabins, MD;  Location: [facility];  Service: Endoscopy;  Laterality: N/A;  . Colonoscopy with propofol N/A 09/20/2014    RMR: Normal ileo-colonoscopy  . Toe surgery     Family History  Problem Relation Age of Onset  . Diabetes Father   . Liver disease Father     liver transplant at [facility], age 83  . Colon cancer Neg Hx   . Lung cancer  Mother 50  . Heart disease Maternal Grandmother   . Parkinson's disease Maternal Grandfather   . Multiple sclerosis Sister 58  . Depression Sister 16    bipolar and schizophrenic  . Alcohol abuse Sister    Social History   Social History  . Marital Status: Single    Spouse Name: N/A  . Number of Children: 0  . Years of Education: N/A   Occupational History  . disability    Social History Main Topics  . Smoking status: Current Every Day Smoker -- 0.10 packs/day for 15 years    Types: Cigarettes  . Smokeless tobacco: Never Used     Comment: smokes two  cigarette daily  . Alcohol Use: No     Comment: none since July 2016. Periodic heavy drinker for months at a time.  . Drug Use: No  . Sexual Activity: Yes   Other Topics Concern  . None   Social History Narrative    ROS:  General: Negative for anorexia, weight loss, fever, chills, fatigue, weakness. ENT: Negative for hoarseness, difficulty swallowing , nasal congestion. CV: Negative for chest pain, angina, palpitations, dyspnea on exertion, peripheral edema.  Respiratory: Negative for dyspnea at rest, dyspnea on exertion, cough, sputum, wheezing.  GI: See history of  present illness. GU:  Negative for dysuria, hematuria, urinary incontinence, urinary frequency, nocturnal urination.  Endo: Negative for unusual weight change.    Physical Examination:   BP 96/50 mmHg  Pulse 62  Temp(Src) 97.1 F (36.2 C)  Ht 5\' 6"  (1.676 m)  Wt 145 lb (65.772 kg)  BMI 23.41 kg/m2  General: Well-nourished, well-developed in no acute distress.  Eyes: No icterus. Mouth: Oropharyngeal mucosa moist and pink , no lesions erythema or exudate. Lungs: Clear to auscultation bilaterally.  Heart: Regular rate and rhythm, no murmurs rubs or gallops.  Abdomen: Bowel sounds are normal, nontender, nondistended, no hepatosplenomegaly or masses, no abdominal bruits or hernia , no rebound or guarding.   Extremities: No lower extremity edema on the right. No clubbing or deformities. Left leg in boot Neuro: Alert and oriented x 4   Skin: Warm and dry, no jaundice.   Psych: Alert and cooperative, normal mood and affect.  Labs:  Lab Results  Component Value Date   WBC 7.4 03/29/2015   HGB 12.0 03/29/2015   HCT 36.6 03/29/2015   MCV 90.8 03/29/2015   PLT 170 03/29/2015   Lab Results  Component Value Date   FERRITIN 151 03/29/2015   Lab Results  Component Value Date   IRON 58 02/13/2015   TIBC 294 02/13/2015   FERRITIN 151 03/29/2015   Lab Results  Component Value Date   CREATININE 0.83 02/19/2015   BUN 19 02/19/2015   NA 140 02/19/2015   K 3.2* 02/19/2015   CL 104 02/19/2015   CO2 22 02/19/2015   Lab Results  Component Value Date   ALT 16 02/19/2015   AST 32 02/19/2015   ALKPHOS 71 02/19/2015   BILITOT 0.9 02/19/2015     Imaging Studies: Dg Toe 4th Left  04/18/2015  CLINICAL DATA:  Hit left fourth toe on dresser, with left fourth toe pain. Initial encounter. EXAM: LEFT FOURTH TOE COMPARISON:  MRI of the left foot performed 08/01/2007 FINDINGS: There is a mildly displaced oblique fracture through the distal aspect of the fourth proximal phalanx, with mild  lateral and dorsal displacement. Mild shortening is noted at the fracture site. No definite soft tissue abnormalities are characterized. Visualized joint spaces are  preserved. IMPRESSION: Mildly displaced oblique fracture through the distal aspect of the fourth proximal phalanx, with mild lateral and dorsal displacement. Mild shortening at the fracture site. Electronically Signed   By: Garald Balding M.D.   On: 04/18/2015 02:15

## 2015-05-14 NOTE — Assessment & Plan Note (Addendum)
Anemia, previously on PO iron replacement with ferrous sulfate with improved HGB but with persistent nausea.  PO iron placed on hold in November 2016 with improvement in nausea.  Now on IV iron replacement when indicated.  Oncology Flowsheet 02/25/2015 03/04/2015  ferric gluconate (NULECIT) IV 125 mg 125 mg   Labs today: CBC diff, CMET, ferritin.  She notes improvement in her nausea since holding her PO iron.  It has not yet completely resolved, but it is much improved. She is following with GI as well for pyloric spasm with gastric outlet obstruction.  Labs in 3 and 6 months: CBC diff, iron/TIBC, ferritin  She notes that she is having laboratory work performed by Dr. Moshe Cipro (primary care provider) in June 2017. In an attempt to consolidate lab appointments, I written an order for aforementioned laboratory work to be performed in June 2017. We will perform laboratory work in 6 months here in the clinic as planned.  Labs in 6 months: CMET  I refilled the patient's potassium chloride.  Return in 6 months for follow-up. We will discontinue her oral iron at this time.

## 2015-05-14 NOTE — Progress Notes (Signed)
Tracey Nakayama, MD [address] [place] [address]  Iron deficiency anemia due to chronic blood loss - Plan: CBC with Differential, Iron and TIBC, Ferritin, Comprehensive metabolic panel  Hypokalemia - Plan: potassium chloride SA (K-DUR,KLOR-CON) 20 MEQ tablet  CURRENT THERAPY: PO ferrous sulfate discontinued beginning on 12/04/2014 due to persistent N/V  INTERVAL HISTORY: Tracey Daniel 35 y.o. female returns for followup of intermittent iron deficiency anemia, intolerance to PO ferrous sulfate with N/V, now needing intermittent IV iron replacement when indicated.  She recently saw GI.  Tracey Daniel has a history of pylorospasm requiring intermittent BOTOX injection with the last being in Jan 2015.  She continues to hold her iron.  She admits that her nausea is improved since stopping PO iron replacement, but remains. She is following with GI as mentioned above. We will discontinue ferrous sulfate permanently at this time.  She denies any known blood loss.  She has not had a menstrual cycle in years.  She is a recent injury to her left foot is being managed by orthopedics, Dr. Percell Miller.  She is in a walking boot with crutches. Left foot is cleanly wrapped.  Past Medical History  Diagnosis Date  . Migraines   . Osteoporosis   . Seasonal allergies   . Sinusitis   . Back pain, chronic   . Nicotine addiction   . Constipation   . Vitamin D deficiency   . Gastritis   . Gastroparesis   . Pyloric spasm 03/30/2011  . Gastric outlet obstruction   . COPD (chronic obstructive pulmonary disease) ([place])   . Asthma   . Substance abuse 2008    marijuana  . Peptic ulcer disease   . Depression 2000    h/o suicidal ideation  . Anemia of other chronic disease 11/29/2012  . Chronic abdominal pain   . Nausea and vomiting     recurrent  . Cyst of skin     mid spine area  . 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     is allergic to benadryl; latex; and penicillins.  Current Outpatient Prescriptions on File Prior to Visit  Medication Sig Dispense Refill  . albuterol (PROVENTIL HFA;VENTOLIN HFA) 108 (90 BASE) MCG/ACT inhaler Inhale 2 puffs into the lungs every 6 (six) hours as needed. Shortness of breath (Patient taking differently: Inhale 2 puffs into the lungs every 6 (six) hours as needed for wheezing or shortness of breath. Shortness of breath) 6.7 g 4  . budesonide-formoterol (SYMBICORT) 80-4.5 MCG/ACT inhaler Inhale 2 puffs into the lungs 2 (two) times daily. (Patient taking differently: Inhale 2 puffs into the lungs every evening. ) 1 Inhaler 4  . calcium-vitamin D (OSCAL WITH D) 500-200 MG-UNIT per tablet Take 1 tablet by mouth 2 (two) times daily.    . clobetasol cream (TEMOVATE) [id number] % Apply 1 application topically 2 (two) times daily as needed (rash). Apply to rash    . dexlansoprazole (DEXILANT) 60 MG capsule Take 1  capsule (60 mg total) by mouth daily. 90 capsule 3  . DULoxetine (CYMBALTA) 30 MG capsule Take 1 capsule (30 mg total) by mouth daily. 30 capsule 3  . ergocalciferol (VITAMIN D2) 50000 UNITS capsule Take 1 capsule (50,000 Units total) by mouth once a week. One capsule once weekly 12 capsule 1  . estradiol (ESTRACE VAGINAL) 0.1 MG/GM vaginal cream Place 2 g vaginally at  bedtime. As directed    . estradiol (ESTRACE) 1 MG tablet Take 1 tablet (1 mg total) by mouth daily. 30 tablet 11  . feeding supplement (ENSURE CLINICAL STRENGTH) LIQD Take 237 mLs by mouth 3 (three) times daily with meals. (Patient taking differently: Take 237 mLs by mouth 2 (two) times daily. ) 10 Bottle 3  . gabapentin (NEURONTIN) 100 MG capsule Take 100 mg by mouth 3 (three) times daily.    Marland Kitchen HYDROcodone-acetaminophen (NORCO/VICODIN) 5-325 MG tablet Take 1 tablet by mouth every 4 (four) hours as needed. 12 tablet 0  . Linaclotide (LINZESS) 290 MCG CAPS capsule Take 1 capsule (290 mcg total) by mouth daily. 30 capsule 5  . loratadine (CLARITIN) 10 MG tablet Take 1 tablet (10 mg total) by mouth daily. 30 tablet 5  . mometasone (NASONEX) 50 MCG/ACT nasal spray Place 2 sprays into the nose 2 (two) times daily. 17 g 12  . NUCYNTA 50 MG TABS tablet Take 50 mg by mouth every 8 (eight) hours as needed for moderate pain. Reported on 05/08/2015  0  . pantoprazole (PROTONIX) 40 MG tablet TAKE 1 TABLET BY MOUTH TWICE DAILY BEFORE A MEAL 180 tablet 3  . progesterone (PROMETRIUM) 200 MG capsule TAKE 1 CAPSULE BY MOUTH EVERY DAY. EVERY THIRD MONTH, JANUARY, APRIL, JULY, AND OCTOBER FOR 2 WEEKS 14 capsule 3  . promethazine (PHENERGAN) 25 MG tablet Take 1 tablet (25 mg total) by mouth every 6 (six) hours as needed for nausea or vomiting. 20 tablet 0  . sucralfate (CARAFATE) 1 G tablet Crush one tablet and mix in applesauce, take every morning and at bedtime as needed for stomach burning. 60 tablet 3  . tiZANidine (ZANAFLEX) 4 MG tablet Take 4 mg by mouth every 6 (six) hours as needed for muscle spasms.      No current facility-administered medications on file prior to visit.    Past Surgical History  Procedure Laterality Date  . Cholecystectomy      ?2002  . Laser surgery on cervix    . Carpal tunnel release      left hand  . Esophagogastroduodenoscopy  12/25/2010    Dorothyann Peng, MD;  moderate  gastritis, ?goo secondary to pylorspasm. BX showed reactive gstropathy no h.pyori, SB mucosa with intramucosal lymphocytosis and partial villous blunting (TTG 4.0 normal)  . Esophageal dilation  12/25/2010    Procedure: ESOPHAGEAL DILATION;  Surgeon: Dorothyann Peng, MD;  [facility]: [facility];  Service: Endoscopy;;  . Esophagogastroduodenoscopy N/A 11/14/2012    [id number] hiatal hernia. Abnormal gastric mucosa of  uncertain significance-status post biopsy. Subjectively, patient may have recurrent symptomatic, pylorospasm.  . Esophagogastroduodenoscopy (egd) with propofol N/A 02/09/2013    elongated stomach, partial lower esophageal ring widely patent. No obvious pyloric stenosis s/p Botox  . Botox injection N/A 02/09/2013    Procedure: BOTOX INJECTION;  Surgeon: Missy Sabins, MD;  Location: [facility];  Service: Endoscopy;  Laterality: N/A;  possible balloon  . Balloon dilation N/A 02/09/2013    Procedure: BALLOON DILATION;  Surgeon: Missy Sabins, MD;  Location: [facility];  Service: Endoscopy;  Laterality: N/A;  . Colonoscopy with propofol N/A 09/20/2014    RMR: Normal ileo-colonoscopy  . Toe surgery      Denies any headaches, dizziness, double vision, fevers, chills, night sweats, diarrhea, constipation, chest pain, heart palpitations, shortness of breath, blood in stool, black tarry stool, urinary pain, urinary burning, urinary frequency, hematuria.   PHYSICAL EXAMINATION  ECOG PERFORMANCE STATUS: 1 - Symptomatic but completely ambulatory  Filed Vitals:   05/16/15 0931  BP: 121/56  Pulse: 64  Temp: 98.6 F (37 C)  Resp: 18    GENERAL:alert, no distress, well nourished, cooperative, smiling and unaccompanied. With crutches and walking boot on left foot. SKIN: skin color, texture, turgor are normal, no rashes or significant lesions HEAD: Normocephalic, No masses, lesions, tenderness or abnormalities EYES: normal, PERRLA, EOMI, Conjunctiva are pink and non-injected EARS: External  ears normal OROPHARYNX:lips, buccal mucosa, and tongue normal and mucous membranes are moist  NECK: supple, no adenopathy, thyroid normal size, non-tender, without nodularity, no stridor, non-tender, trachea midline LYMPH:  no palpable lymphadenopathy BREAST:not examined LUNGS: clear to auscultation without wheezes, rales, or rhonchi. HEART: regular rate & rhythm, no murmurs, no gallops, S1 normal and S2 normal ABDOMEN:abdomen soft, non-tender and normal bowel sounds BACK: Back symmetric, no curvature. EXTREMITIES:less then 2 second capillary refill, no joint deformities, effusion, or inflammation, no skin discoloration, no cyanosis  NEURO: alert & oriented x 3 with fluent speech, no focal motor/sensory deficits, gait normal   LABORATORY DATA: CBC    Component Value Date/Time   WBC 8.7 05/16/2015 0841   RBC 3.97 05/16/2015 0841   HGB 12.0 05/16/2015 0841   HCT 35.9* 05/16/2015 0841   PLT 157 05/16/2015 0841   MCV 90.4 05/16/2015 0841   MCH 30.2 05/16/2015 0841   MCHC 33.4 05/16/2015 0841   RDW 13.4 05/16/2015 0841   LYMPHSABS 1.8 05/16/2015 0841   MONOABS 0.4 05/16/2015 0841   EOSABS 0.0 05/16/2015 0841   BASOSABS 0.0 05/16/2015 0841      Chemistry      Component Value Date/Time   NA 143 05/16/2015 0841   K 3.8 05/16/2015 0841   CL 107 05/16/2015 0841   CO2 28 05/16/2015 0841   BUN 15 05/16/2015 0841   CREATININE 0.88 05/16/2015 0841   CREATININE 0.72 05/02/2014 0733      Component Value Date/Time   CALCIUM 9.7 05/16/2015 0841   ALKPHOS 64 05/16/2015 0841   AST 30 05/16/2015 0841   ALT 28 05/16/2015 0841   BILITOT 0.5 05/16/2015 0841     Lab Results  Component Value Date   IRON 58 02/13/2015   TIBC 294 02/13/2015   FERRITIN 149 05/16/2015      PENDING LABS:   RADIOGRAPHIC STUDIES:  Dg Toe 4th Left  04/18/2015  CLINICAL DATA:  Hit left fourth toe on dresser, with left fourth toe pain. Initial encounter. EXAM: LEFT FOURTH TOE COMPARISON:  MRI of the  left foot performed 08/01/2007 FINDINGS: There is a mildly displaced oblique fracture through the distal aspect of the fourth proximal phalanx, with mild lateral and dorsal displacement. Mild shortening is noted at the fracture site. No definite soft tissue abnormalities are characterized. Visualized joint spaces are preserved. IMPRESSION: Mildly displaced oblique fracture through the distal aspect of the fourth proximal phalanx, with mild lateral and dorsal displacement. Mild shortening at the fracture site. Electronically Signed   By: Garald Balding M.D.   On: 04/18/2015 02:15     PATHOLOGY:  ASSESSMENT AND PLAN:  Anemia Anemia, previously on PO iron replacement with ferrous sulfate with improved HGB but with persistent nausea.  PO iron placed on hold in November 2016 with improvement in nausea.  Now on IV iron replacement when indicated.  Oncology Flowsheet 02/25/2015 03/04/2015  ferric gluconate (NULECIT) IV 125 mg 125 mg   Labs today: CBC diff, CMET, ferritin.  She notes improvement in her nausea since holding her PO iron.  It has not yet completely resolved, but it is much improved. She is following with GI as well for pyloric spasm with gastric outlet obstruction.  Labs in 3 and 6 months: CBC diff, iron/TIBC, ferritin  She notes that she is having laboratory work performed by Dr. Moshe Cipro (primary care provider) in June 2017. In an attempt to consolidate lab appointments, I written an order for aforementioned laboratory work to be performed in June 2017. We will perform laboratory work in 6 months here in the clinic as planned.  Labs in 6 months: CMET  I refilled the patient's potassium chloride.  Return in 6 months for follow-up. We will discontinue her oral iron at this time.  THERAPY PLAN:  Discontinue oral iron and we will monitor labs and provide IV iron replacement when indicated.  All questions were answered. The patient knows to call the clinic with any problems, questions  or concerns. We can certainly see the patient much sooner if necessary.  Patient and plan discussed with Dr. Ancil Linsey and she is in agreement with the aforementioned.   This note is electronically signed by: Doy Mince 05/16/2015 3:11 PM

## 2015-05-16 ENCOUNTER — Encounter (HOSPITAL_COMMUNITY): Payer: Self-pay | Admitting: Oncology

## 2015-05-16 ENCOUNTER — Other Ambulatory Visit (HOSPITAL_COMMUNITY): Payer: Medicare Other

## 2015-05-16 ENCOUNTER — Ambulatory Visit (HOSPITAL_COMMUNITY): Payer: Medicare Other | Admitting: Oncology

## 2015-05-16 ENCOUNTER — Encounter (HOSPITAL_COMMUNITY): Payer: Commercial Managed Care - HMO | Attending: Oncology | Admitting: Oncology

## 2015-05-16 ENCOUNTER — Encounter (HOSPITAL_COMMUNITY): Payer: Commercial Managed Care - HMO

## 2015-05-16 VITALS — BP 121/56 | HR 64 | Temp 98.6°F | Resp 18 | Ht 66.0 in | Wt 143.6 lb

## 2015-05-16 DIAGNOSIS — D649 Anemia, unspecified: Secondary | ICD-10-CM | POA: Diagnosis not present

## 2015-05-16 DIAGNOSIS — E876 Hypokalemia: Secondary | ICD-10-CM | POA: Diagnosis not present

## 2015-05-16 DIAGNOSIS — D500 Iron deficiency anemia secondary to blood loss (chronic): Secondary | ICD-10-CM

## 2015-05-16 LAB — CBC WITH DIFFERENTIAL/PLATELET
BASOS PCT: 0 %
Basophils Absolute: 0 10*3/uL (ref 0.0–0.1)
EOS ABS: 0 10*3/uL (ref 0.0–0.7)
Eosinophils Relative: 0 %
HCT: 35.9 % — ABNORMAL LOW (ref 36.0–46.0)
HEMOGLOBIN: 12 g/dL (ref 12.0–15.0)
LYMPHS ABS: 1.8 10*3/uL (ref 0.7–4.0)
LYMPHS PCT: 21 %
MCH: 30.2 pg (ref 26.0–34.0)
MCHC: 33.4 g/dL (ref 30.0–36.0)
MCV: 90.4 fL (ref 78.0–100.0)
Monocytes Absolute: 0.4 10*3/uL (ref 0.1–1.0)
Monocytes Relative: 5 %
NEUTROS ABS: 6.4 10*3/uL (ref 1.7–7.7)
NEUTROS PCT: 74 %
PLATELETS: 157 10*3/uL (ref 150–400)
RBC: 3.97 MIL/uL (ref 3.87–5.11)
RDW: 13.4 % (ref 11.5–15.5)
WBC: 8.7 10*3/uL (ref 4.0–10.5)

## 2015-05-16 LAB — COMPREHENSIVE METABOLIC PANEL
ALBUMIN: 4.4 g/dL (ref 3.5–5.0)
ALK PHOS: 64 U/L (ref 38–126)
ALT: 28 U/L (ref 14–54)
ANION GAP: 8 (ref 5–15)
AST: 30 U/L (ref 15–41)
BUN: 15 mg/dL (ref 6–20)
CHLORIDE: 107 mmol/L (ref 101–111)
CO2: 28 mmol/L (ref 22–32)
Calcium: 9.7 mg/dL (ref 8.9–10.3)
Creatinine, Ser: 0.88 mg/dL (ref 0.44–1.00)
GFR calc Af Amer: >60 mL/min (ref >60)
GFR calc non Af Amer: >60 mL/min (ref >60)
GLUCOSE: 96 mg/dL (ref 65–99)
POTASSIUM: 3.8 mmol/L (ref 3.5–5.1)
SODIUM: 143 mmol/L (ref 135–145)
Total Bilirubin: 0.5 mg/dL (ref 0.3–1.2)
Total Protein: 7.4 g/dL (ref 6.5–8.1)

## 2015-05-16 LAB — FERRITIN: Ferritin: 149 ng/mL (ref 11–307)

## 2015-05-16 MED ORDER — POTASSIUM CHLORIDE CRYS ER 20 MEQ PO TBCR: 20.0000 meq | EXTENDED_RELEASE_TABLET | Freq: Every day | ORAL | Status: DC

## 2015-05-16 NOTE — Patient Instructions (Signed)
[place] at [facility] Discharge Instructions  RECOMMENDATIONS MADE BY THE CONSULTANT AND ANY TEST RESULTS WILL BE SENT TO YOUR REFERRING PHYSICIAN.  Labs today are great! Iron level is pending at this time.  We will keep you apprised when result is available. Order is given to you for labs in June with Dr. Griffin Dakin labs. Will repeat labs in 6 months Return in 6 months for follow-up. Follow-up with Dr. Moshe Cipro (PCP). Follow-up with GI (as directed or as needed).  CBC    Component Value Date/Time   WBC 8.7 05/16/2015 0841   RBC 3.97 05/16/2015 0841   HGB 12.0 05/16/2015 0841   HCT 35.9* 05/16/2015 0841   PLT 157 05/16/2015 0841   MCV 90.4 05/16/2015 0841   MCH 30.2 05/16/2015 0841   MCHC 33.4 05/16/2015 0841   RDW 13.4 05/16/2015 0841   LYMPHSABS 1.8 05/16/2015 0841   MONOABS 0.4 05/16/2015 0841   EOSABS 0.0 05/16/2015 0841   BASOSABS 0.0 05/16/2015 0841      Chemistry      Component Value Date/Time   NA 143 05/16/2015 0841   K 3.8 05/16/2015 0841   CL 107 05/16/2015 0841   CO2 28 05/16/2015 0841   BUN 15 05/16/2015 0841   CREATININE 0.88 05/16/2015 0841   CREATININE 0.72 05/02/2014 0733      Component Value Date/Time   CALCIUM 9.7 05/16/2015 0841   ALKPHOS 64 05/16/2015 0841   AST 30 05/16/2015 0841   ALT 28 05/16/2015 0841   BILITOT 0.5 05/16/2015 0841       Thank you for choosing [place] at [facility] to provide your oncology and hematology care.  To afford each patient quality time with our provider, please arrive at least 15 minutes before your scheduled appointment time.   Beginning January 23rd 2017 lab work for the [facility] will be done in the  Main lab at [facility] on 1st floor. If you have a lab appointment with the [facility] please come in thru the  Main Entrance and check in at the main information desk  You need to re-schedule your appointment should you arrive 10 or more minutes  late.  We strive to give you quality time with our providers, and arriving late affects you and other patients whose appointments are after yours.  Also, if you no show three or more times for appointments you may be dismissed from the clinic at the providers discretion.     Again, thank you for choosing [facility].  Our hope is that these requests will decrease the amount of time that you wait before being seen by our physicians.       _____________________________________________________________  Should you have questions after your visit to [facility], please contact our office at [phone] between the hours of 8:30 a.m. and 4:30 p.m.  Voicemails left after 4:30 p.m. will not be returned until the following business day.  For prescription refill requests, have your pharmacy contact our office.         Resources For Cancer Patients and their Caregivers ? American Cancer Society: Can assist with transportation, wigs, general needs, runs Look Good Feel Better.        319-169-4700 ? Cancer Care: Provides financial assistance, online support groups, medication/co-pay assistance.  1-800-813-HOPE 561-012-6201) ? [facility] Assists [place] cancer patients and their families through emotional , educational and financial support.  (843)851-4320 ? [place] DSS Where to apply for food stamps, Medicaid and utility assistance. 603-882-3786 ? [employer]: Transportation to medical appointments. 412-573-0268 ? Social Security Administration: May apply for disability if have a Stage IV cancer. (435)785-8484 (415)321-1729 ? [employer], Disability and Transit Services: Assists with nutrition, care and transit needs. 581-789-8718

## 2015-05-28 ENCOUNTER — Telehealth: Payer: Self-pay

## 2015-05-28 NOTE — Telephone Encounter (Signed)
I would recommend continue protonix bid and stop Dexilant. No need to be on both PPIs.

## 2015-05-28 NOTE — Telephone Encounter (Signed)
Tracey Daniel, this pt was to call me back about her meds. I called her this AM and she said she takes the Protonix bid and Dexilant qd. I also called the pharmacist, Suezanne Jacquet , at [facility] on [address] and he confirmed she is taking both.

## 2015-05-29 DIAGNOSIS — Z79899 Other long term (current) drug therapy: Secondary | ICD-10-CM | POA: Diagnosis not present

## 2015-05-29 DIAGNOSIS — M7138 Other bursal cyst, other site: Secondary | ICD-10-CM | POA: Diagnosis not present

## 2015-05-29 DIAGNOSIS — M47816 Spondylosis without myelopathy or radiculopathy, lumbar region: Secondary | ICD-10-CM | POA: Diagnosis not present

## 2015-05-29 DIAGNOSIS — M4696 Unspecified inflammatory spondylopathy, lumbar region: Secondary | ICD-10-CM | POA: Diagnosis not present

## 2015-05-29 DIAGNOSIS — Z5181 Encounter for therapeutic drug level monitoring: Secondary | ICD-10-CM | POA: Diagnosis not present

## 2015-05-29 DIAGNOSIS — Z79891 Long term (current) use of opiate analgesic: Secondary | ICD-10-CM | POA: Diagnosis not present

## 2015-05-30 NOTE — Telephone Encounter (Signed)
LMOM to call.

## 2015-05-30 NOTE — Telephone Encounter (Signed)
Pt is aware.  

## 2015-06-03 DIAGNOSIS — S92515D Nondisplaced fracture of proximal phalanx of left lesser toe(s), subsequent encounter for fracture with routine healing: Secondary | ICD-10-CM | POA: Diagnosis not present

## 2015-07-01 DIAGNOSIS — S92515D Nondisplaced fracture of proximal phalanx of left lesser toe(s), subsequent encounter for fracture with routine healing: Secondary | ICD-10-CM | POA: Diagnosis not present

## 2015-07-07 ENCOUNTER — Emergency Department (HOSPITAL_COMMUNITY): Payer: Commercial Managed Care - HMO

## 2015-07-07 ENCOUNTER — Encounter (HOSPITAL_COMMUNITY): Payer: Self-pay | Admitting: *Deleted

## 2015-07-07 ENCOUNTER — Emergency Department (HOSPITAL_COMMUNITY)
Admission: EM | Admit: 2015-07-07 | Discharge: 2015-07-07 | Disposition: A | Discharge status: Home / Self Care | Payer: Commercial Managed Care - HMO | Attending: Emergency Medicine | Admitting: Emergency Medicine

## 2015-07-07 DIAGNOSIS — R1013 Epigastric pain: Secondary | ICD-10-CM | POA: Diagnosis present

## 2015-07-07 DIAGNOSIS — F329 Major depressive disorder, single episode, unspecified: Secondary | ICD-10-CM | POA: Diagnosis not present

## 2015-07-07 DIAGNOSIS — J45909 Unspecified asthma, uncomplicated: Secondary | ICD-10-CM | POA: Diagnosis not present

## 2015-07-07 DIAGNOSIS — R112 Nausea with vomiting, unspecified: Secondary | ICD-10-CM | POA: Insufficient documentation

## 2015-07-07 DIAGNOSIS — F17210 Nicotine dependence, cigarettes, uncomplicated: Secondary | ICD-10-CM | POA: Diagnosis not present

## 2015-07-07 DIAGNOSIS — E876 Hypokalemia: Secondary | ICD-10-CM | POA: Diagnosis not present

## 2015-07-07 DIAGNOSIS — Z79899 Other long term (current) drug therapy: Secondary | ICD-10-CM | POA: Diagnosis not present

## 2015-07-07 DIAGNOSIS — R197 Diarrhea, unspecified: Secondary | ICD-10-CM | POA: Insufficient documentation

## 2015-07-07 DIAGNOSIS — R1084 Generalized abdominal pain: Secondary | ICD-10-CM | POA: Diagnosis not present

## 2015-07-07 DIAGNOSIS — J449 Chronic obstructive pulmonary disease, unspecified: Secondary | ICD-10-CM | POA: Diagnosis not present

## 2015-07-07 DIAGNOSIS — R1110 Vomiting, unspecified: Secondary | ICD-10-CM | POA: Diagnosis not present

## 2015-07-07 LAB — URINALYSIS, ROUTINE W REFLEX MICROSCOPIC
Bilirubin Urine: NEGATIVE
GLUCOSE, UA: NEGATIVE mg/dL
HGB URINE DIPSTICK: NEGATIVE
Ketones, ur: NEGATIVE mg/dL
LEUKOCYTES UA: NEGATIVE
Nitrite: NEGATIVE
PH: 8.5 — AB (ref 5.0–8.0)
Protein, ur: 100 mg/dL — AB
SPECIFIC GRAVITY, URINE: 1.027 (ref 1.005–1.030)

## 2015-07-07 LAB — CBC
HEMATOCRIT: 35.6 % — AB (ref 36.0–46.0)
HEMOGLOBIN: 12.3 g/dL (ref 12.0–15.0)
MCH: 29.9 pg (ref 26.0–34.0)
MCHC: 34.6 g/dL (ref 30.0–36.0)
MCV: 86.4 fL (ref 78.0–100.0)
Platelets: 176 10*3/uL (ref 150–400)
RBC: 4.12 MIL/uL (ref 3.87–5.11)
RDW: 13.7 % (ref 11.5–15.5)
WBC: 14.4 10*3/uL — AB (ref 4.0–10.5)

## 2015-07-07 LAB — COMPREHENSIVE METABOLIC PANEL
ALT: 27 U/L (ref 14–54)
AST: 31 U/L (ref 15–41)
Albumin: 5.1 g/dL — ABNORMAL HIGH (ref 3.5–5.0)
Alkaline Phosphatase: 68 U/L (ref 38–126)
Anion gap: 12 (ref 5–15)
BUN: 21 mg/dL — ABNORMAL HIGH (ref 6–20)
CHLORIDE: 104 mmol/L (ref 101–111)
CO2: 24 mmol/L (ref 22–32)
CREATININE: 0.89 mg/dL (ref 0.44–1.00)
Calcium: 10.1 mg/dL (ref 8.9–10.3)
GFR calc non Af Amer: >60 mL/min (ref >60)
Glucose, Bld: 133 mg/dL — ABNORMAL HIGH (ref 65–99)
POTASSIUM: 3.4 mmol/L — AB (ref 3.5–5.1)
SODIUM: 140 mmol/L (ref 135–145)
Total Bilirubin: 0.6 mg/dL (ref 0.3–1.2)
Total Protein: 8.1 g/dL (ref 6.5–8.1)

## 2015-07-07 LAB — URINE MICROSCOPIC-ADD ON: RBC / HPF: NONE SEEN RBC/hpf (ref 0–5)

## 2015-07-07 LAB — LIPASE, BLOOD: LIPASE: 62 U/L — AB (ref 11–51)

## 2015-07-07 LAB — I-STAT TROPONIN, ED: TROPONIN I, POC: 0 ng/mL (ref 0.00–0.08)

## 2015-07-07 LAB — I-STAT BETA HCG BLOOD, ED (MC, WL, AP ONLY): I-stat hCG, quantitative: 5.2 m[IU]/mL — ABNORMAL HIGH (ref <5)

## 2015-07-07 MED ORDER — HYDROCODONE-ACETAMINOPHEN 5-325 MG PO TABS: 1.0000 | ORAL_TABLET | ORAL | Status: DC | PRN

## 2015-07-07 MED ORDER — SODIUM CHLORIDE 0.9 % IV BOLUS (SEPSIS): 1000.0000 mL | Freq: Once | INTRAVENOUS | Status: DC

## 2015-07-07 MED ORDER — SODIUM CHLORIDE 0.9 % IV BOLUS (SEPSIS)
2000.0000 mL | Freq: Once | INTRAVENOUS | Status: AC
  Administered 2015-07-07: 2000 mL via INTRAVENOUS

## 2015-07-07 MED ORDER — ONDANSETRON HCL 4 MG/2ML IJ SOLN
4.0000 mg | Freq: Once | INTRAMUSCULAR | Status: AC
  Administered 2015-07-07: 4 mg via INTRAVENOUS
  Filled 2015-07-07: qty 2

## 2015-07-07 MED ORDER — MORPHINE SULFATE (PF) 4 MG/ML IV SOLN
6.0000 mg | Freq: Once | INTRAVENOUS | Status: AC
  Administered 2015-07-07: 6 mg via INTRAVENOUS
  Filled 2015-07-07: qty 2

## 2015-07-07 MED ORDER — PROCHLORPERAZINE EDISYLATE 5 MG/ML IJ SOLN
10.0000 mg | Freq: Once | INTRAMUSCULAR | Status: AC
  Administered 2015-07-07: 10 mg via INTRAVENOUS
  Filled 2015-07-07: qty 2

## 2015-07-07 MED ORDER — HYDROMORPHONE HCL 1 MG/ML IJ SOLN
1.0000 mg | Freq: Once | INTRAMUSCULAR | Status: AC
  Administered 2015-07-07: 1 mg via INTRAVENOUS
  Filled 2015-07-07: qty 1

## 2015-07-07 MED ORDER — METOCLOPRAMIDE HCL 5 MG/ML IJ SOLN
10.0000 mg | Freq: Once | INTRAMUSCULAR | Status: AC
  Administered 2015-07-07: 10 mg via INTRAVENOUS
  Filled 2015-07-07: qty 2

## 2015-07-07 MED ORDER — METOCLOPRAMIDE HCL 10 MG PO TABS: 10.0000 mg | ORAL_TABLET | Freq: Four times a day (QID) | ORAL | Status: DC

## 2015-07-07 NOTE — ED Notes (Signed)
Pt would like labs/IV done at same time.  RN aware. 

## 2015-07-07 NOTE — ED Provider Notes (Signed)
Patient was accepted in sign out pending x-ray results. X-ray without acute process. Patient does have mild leukocytosis. Also mildly elevated lipase. On reevaluation the patient was still uncomfortable but was requesting discharge. She was ordered and other dose of Dilaudid and Zofran. She was provided with a Sprite. She was discharged home in stable condition with strict return precautions. She was also provided with prescriptions for Reglan and Norco.  Harvel Quale, MD 07/07/15 2124

## 2015-07-07 NOTE — ED Notes (Signed)
Pt is aware a urine sample is needed but reports she can not go to the bathroom.

## 2015-07-07 NOTE — ED Notes (Signed)
Pt complains of nausea, emesis, abdominal pain since this morning. Pt states she ate sausage this morning then started vomiting at ~12PM today.

## 2015-07-07 NOTE — Discharge Instructions (Signed)
You were seen and evaluated today for your vomiting and abdominal pain. Likely this is related to your problems with the outlet of your stomach. Please take the medications prescribed to help with your symptoms. Return if your symptoms worsen or you cannot eat and drink at home. Please follow-up with your gastroenterologist and with your primary care physician.  Nausea and Vomiting Nausea is a sick feeling that often comes before throwing up (vomiting). Vomiting is a reflex where stomach contents come out of your mouth. Vomiting can cause severe loss of body fluids (dehydration). Children and elderly adults can become dehydrated quickly, especially if they also have diarrhea. Nausea and vomiting are symptoms of a condition or disease. It is important to find the cause of your symptoms. CAUSES   Direct irritation of the stomach lining. This irritation can result from increased acid production (gastroesophageal reflux disease), infection, food poisoning, taking certain medicines (such as nonsteroidal anti-inflammatory drugs), alcohol use, or tobacco use.  Signals from the brain.These signals could be caused by a headache, heat exposure, an inner ear disturbance, increased pressure in the brain from injury, infection, a tumor, or a concussion, pain, emotional stimulus, or metabolic problems.  An obstruction in the gastrointestinal tract (bowel obstruction).  Illnesses such as diabetes, hepatitis, gallbladder problems, appendicitis, kidney problems, cancer, sepsis, atypical symptoms of a heart attack, or eating disorders.  Medical treatments such as chemotherapy and radiation.  Receiving medicine that makes you sleep (general anesthetic) during surgery. DIAGNOSIS Your caregiver may ask for tests to be done if the problems do not improve after a few days. Tests may also be done if symptoms are severe or if the reason for the nausea and vomiting is not clear. Tests may include:  Urine tests.  Blood  tests.  Stool tests.  Cultures (to look for evidence of infection).  X-rays or other imaging studies. Test results can help your caregiver make decisions about treatment or the need for additional tests. TREATMENT You need to stay well hydrated. Drink frequently but in small amounts.You may wish to drink water, sports drinks, clear broth, or eat frozen ice pops or gelatin dessert to help stay hydrated.When you eat, eating slowly may help prevent nausea.There are also some antinausea medicines that may help prevent nausea. HOME CARE INSTRUCTIONS   Take all medicine as directed by your caregiver.  If you do not have an appetite, do not force yourself to eat. However, you must continue to drink fluids.  If you have an appetite, eat a normal diet unless your caregiver tells you differently.  Eat a variety of complex carbohydrates (rice, wheat, potatoes, bread), lean meats, yogurt, fruits, and vegetables.  Avoid high-fat foods because they are more difficult to digest.  Drink enough water and fluids to keep your urine clear or pale yellow.  If you are dehydrated, ask your caregiver for specific rehydration instructions. Signs of dehydration may include:  Severe thirst.  Dry lips and mouth.  Dizziness.  Dark urine.  Decreasing urine frequency and amount.  Confusion.  Rapid breathing or pulse. SEEK IMMEDIATE MEDICAL CARE IF:   You have blood or brown flecks (like coffee grounds) in your vomit.  You have black or bloody stools.  You have a severe headache or stiff neck.  You are confused.  You have severe abdominal pain.  You have chest pain or trouble breathing.  You do not urinate at least once every 8 hours.  You develop cold or clammy skin.  You continue to vomit  for longer than 24 to 48 hours.  You have a fever. MAKE SURE YOU:   Understand these instructions.  Will watch your condition.  Will get help right away if you are not doing well or get  worse.   This information is not intended to replace advice given to you by your health care provider. Make sure you discuss any questions you have with your health care provider.   Document Released: 01/05/2005 Document Revised: 03/30/2011 Document Reviewed: 06/04/2010 Elsevier Interactive Patient Education [employer].

## 2015-07-07 NOTE — ED Provider Notes (Signed)
CSN: [id number]     Arrival date & time 07/07/15  1612 History   First MD Initiated Contact with Patient 07/07/15 1641     Chief Complaint  Patient presents with  . Abdominal Pain  . Emesis     (Consider location/radiation/quality/duration/timing/severity/associated sxs/prior Treatment) HPI Complains of epigastric abdominal pain and multiple episodes of vomiting onset 11 AM today. She states she's vomited approximately 15 times. The last time she vomited there were flecks of blood. Last bowel movement today, one episode of diarrhea, nonbloody. Pain is typical of pain from that she's had in the past gastroparesis is constant. Nothing makes pain better or worse. No treatment prior to coming here no other associated symptoms. Past Medical History  Diagnosis Date  . Migraines   . Osteoporosis   . Seasonal allergies   . Sinusitis   . Back pain, chronic   . Nicotine addiction   . Constipation   . Vitamin D deficiency   . Gastritis   . Gastroparesis   . Pyloric spasm 03/30/2011  . Gastric outlet obstruction   . COPD (chronic obstructive pulmonary disease) ([place])   . Asthma   . Substance abuse 2008    marijuana  . Peptic ulcer disease   . Depression 2000    h/o suicidal ideation  . Anemia of other chronic disease 11/29/2012  . Chronic abdominal pain   . Nausea and vomiting     recurrent  . Cyst of skin     mid spine area  . Migraine    Past Surgical History  Procedure Laterality Date  . Cholecystectomy      ?2002  . Laser surgery on cervix    . Carpal tunnel release      left hand  . Esophagogastroduodenoscopy  12/25/2010    Dorothyann Peng, MD;  moderate gastritis, ?goo secondary to pylorspasm. BX showed reactive gstropathy no h.pyori, SB mucosa with intramucosal lymphocytosis and partial villous blunting (TTG 4.0 normal)  . Esophageal dilation  12/25/2010    Procedure: ESOPHAGEAL DILATION;  Surgeon: Dorothyann Peng, MD;  Location: [facility];  Service: Endoscopy;;  .  Esophagogastroduodenoscopy N/A 11/14/2012    [id number] hiatal hernia. Abnormal gastric mucosa of  uncertain significance-status post biopsy. Subjectively, patient may have recurrent symptomatic, pylorospasm.  . Esophagogastroduodenoscopy (egd) with propofol N/A 02/09/2013    elongated stomach, partial lower esophageal ring widely patent. No obvious pyloric stenosis s/p Botox  . Botox injection N/A 02/09/2013    Procedure: BOTOX INJECTION;  Surgeon: Missy Sabins, MD;  Location: [facility];  Service: Endoscopy;  Laterality: N/A;  possible balloon  . Balloon dilation N/A 02/09/2013    Procedure: BALLOON DILATION;  Surgeon: Missy Sabins, MD;  Location: [facility];  Service: Endoscopy;  Laterality: N/A;  . Colonoscopy with propofol N/A 09/20/2014    RMR: Normal ileo-colonoscopy  . Toe surgery     Family History  Problem Relation Age of Onset  . Diabetes Father   . Liver disease Father     liver transplant at [facility], age 38  . Colon cancer Neg Hx   . Lung cancer Mother 70  . Heart disease Maternal Grandmother   . Parkinson's disease Maternal Grandfather   . Multiple sclerosis Sister 42  . Depression Sister 16    bipolar and schizophrenic  . Alcohol abuse Sister    Social History  Substance Use Topics  . Smoking status: Current Every Day Smoker -- 0.10 packs/day for 15 years    Types:  Cigarettes  . Smokeless tobacco: Never Used     Comment: smokes two  cigarette daily  . Alcohol Use: No     Comment: none since July 2016. Periodic heavy drinker for months at a time.   OB History    Gravida Para Term Preterm AB TAB SAB Ectopic Multiple Living   1              Review of Systems  Constitutional: Negative.   HENT: Negative.   Respiratory: Negative.   Cardiovascular: Negative.   Gastrointestinal: Positive for nausea, vomiting and abdominal pain.  Genitourinary:       Amenorrheic since 1999  Musculoskeletal: Negative.   Skin: Negative.   Neurological: Negative.    Psychiatric/Behavioral: Negative.   All other systems reviewed and are negative.     Allergies  Benadryl; Latex; and Penicillins  Home Medications   Prior to Admission medications   Medication Sig Start Date End Date Taking? Authorizing Provider  albuterol (PROVENTIL HFA;VENTOLIN HFA) 108 (90 BASE) MCG/ACT inhaler Inhale 2 puffs into the lungs every 6 (six) hours as needed. Shortness of breath Patient taking differently: Inhale 2 puffs into the lungs every 6 (six) hours as needed for wheezing or shortness of breath. Shortness of breath 11/14/12 05/27/15  [name], MD  budesonide-formoterol [facility]) 80-4.5 MCG/ACT inhaler Inhale 2 puffs into the lungs 2 (two) times daily. Patient taking differently: Inhale 2 puffs into the lungs every evening.  11/14/12 05/27/15  [name], MD  calcium-vitamin D (OSCAL WITH D) 500-200 MG-UNIT per tablet Take 1 tablet by mouth 2 (two) times daily.    Historical Provider, MD  clobetasol cream (TEMOVATE) [id number] % Apply 1 application topically 2 (two) times daily as needed (rash). Apply to rash    Historical Provider, MD  dexlansoprazole (DEXILANT) 60 MG capsule Take 1 capsule (60 mg total) by mouth daily. 04/03/15   Carlis Stable, NP  DULoxetine (CYMBALTA) 30 MG capsule Take 1 capsule (30 mg total) by mouth daily. 05/16/14   [name], MD  ergocalciferol (VITAMIN D2) 50000 UNITS capsule Take 1 capsule (50,000 Units total) by mouth once a week. One capsule once weekly 05/16/14   [name], MD  estradiol (ESTRACE VAGINAL) 0.1 MG/GM vaginal cream Place 2 g vaginally at bedtime. As directed    Historical Provider, MD  estradiol (ESTRACE) 1 MG tablet Take 1 tablet (1 mg total) by mouth daily. 05/08/15 05/07/16  Jonnie Kind, MD  feeding supplement (ENSURE CLINICAL STRENGTH) LIQD Take 237 mLs by mouth 3 (three) times daily with meals. Patient taking differently: Take 237 mLs by mouth 2 (two) times daily.  01/01/11   Nishant Dhungel, MD   gabapentin (NEURONTIN) 100 MG capsule Take 100 mg by mouth 3 (three) times daily.    Historical Provider, MD  HYDROcodone-acetaminophen (NORCO/VICODIN) 5-325 MG tablet Take 1 tablet by mouth every 4 (four) hours as needed. 04/18/15   Marella Chimes, PA-C  Linaclotide (LINZESS) 290 MCG CAPS capsule Take 1 capsule (290 mcg total) by mouth daily. 08/27/14   Mahala Menghini, PA-C  loratadine (CLARITIN) 10 MG tablet Take 1 tablet (10 mg total) by mouth daily. 10/09/11   [name], MD  mometasone (NASONEX) 50 MCG/ACT nasal spray Place 2 sprays into the nose 2 (two) times daily. 05/16/14   [name], MD  NUCYNTA 50 MG TABS tablet Take 50 mg by mouth every 8 (eight) hours as needed for moderate pain. Reported on 05/08/2015  08/27/14   Historical Provider, MD  pantoprazole (PROTONIX) 40 MG tablet TAKE 1 TABLET BY MOUTH TWICE DAILY BEFORE A MEAL 04/19/15   Mahala Menghini, PA-C  potassium chloride SA (K-DUR,KLOR-CON) 20 MEQ tablet Take 1 tablet (20 mEq total) by mouth daily. 05/16/15   Baird Cancer, PA-C  progesterone (PROMETRIUM) 200 MG capsule TAKE 1 CAPSULE BY MOUTH EVERY DAY. EVERY THIRD MONTH, [place], APRIL, [place], AND OCTOBER FOR 2 WEEKS 05/09/15   Jonnie Kind, MD  promethazine (PHENERGAN) 25 MG tablet Take 1 tablet (25 mg total) by mouth every 6 (six) hours as needed for nausea or vomiting. 02/19/15   Domenic Moras, PA-C  sucralfate (CARAFATE) 1 G tablet Crush one tablet and mix in applesauce, take every morning and at bedtime as needed for stomach burning. 08/27/14   Mahala Menghini, PA-C  tiZANidine (ZANAFLEX) 4 MG tablet Take 4 mg by mouth every 6 (six) hours as needed for muscle spasms.  10/27/13   Historical Provider, MD   BP 129/59 mmHg  Pulse 66  Temp(Src) 97.9 F (36.6 C) (Oral)  Resp 22  SpO2 100% Physical Exam  Constitutional: She appears well-developed and well-nourished. She appears distressed.  Appears uncomfortable. Writhing on bed  HENT:  Head: Normocephalic and  atraumatic.  Eyes: Conjunctivae are normal. Pupils are equal, round, and reactive to light.  Neck: Neck supple. No tracheal deviation present. No thyromegaly present.  Cardiovascular: Normal rate and regular rhythm.   No murmur heard. Pulmonary/Chest: Effort normal and breath sounds normal.  Abdominal: Soft. Bowel sounds are normal. She exhibits no distension. There is tenderness.  Diffusely tender  Musculoskeletal: Normal range of motion. She exhibits no edema or tenderness.  Neurological: She is alert. Coordination normal.  Skin: Skin is warm and dry. No rash noted.  Psychiatric: She has a normal mood and affect.  Nursing note and vitals reviewed.  6:20 PM patient resting more comfortably after treatment with intravenous fluids, antiemetics and opioids ED Course  Procedures (including critical care time) Labs Review Labs Reviewed  LIPASE, BLOOD  COMPREHENSIVE METABOLIC PANEL  CBC  URINALYSIS, ROUTINE W REFLEX MICROSCOPIC (NOT AT [facility])    Imaging Review No results found. I have personally reviewed and evaluated these images and lab results as part of my medical decision-making.   EKG Interpretation None    pt sigened out to Dr. Alfonse Spruce at 630 pm xrays viewed by me. Results for orders placed or performed during the hospital encounter of 07/07/15  Lipase, blood  Result Value Ref Range   Lipase 62 (H) 11 - 51 U/L  Comprehensive metabolic panel  Result Value Ref Range   Sodium 140 135 - 145 mmol/L   Potassium 3.4 (L) 3.5 - 5.1 mmol/L   Chloride 104 101 - 111 mmol/L   CO2 24 22 - 32 mmol/L   Glucose, Bld 133 (H) 65 - 99 mg/dL   BUN 21 (H) 6 - 20 mg/dL   Creatinine, Ser 0.89 0.44 - 1.00 mg/dL   Calcium 10.1 8.9 - 10.3 mg/dL   Total Protein 8.1 6.5 - 8.1 g/dL   Albumin 5.1 (H) 3.5 - 5.0 g/dL   AST 31 15 - 41 U/L   ALT 27 14 - 54 U/L   Alkaline Phosphatase 68 38 - 126 U/L   Total Bilirubin 0.6 0.3 - 1.2 mg/dL   GFR calc non Af Amer >60 >60 mL/min   GFR calc Af Amer >60  >60 mL/min   Anion gap 12 5 -  15  CBC  Result Value Ref Range   WBC 14.4 (H) 4.0 - 10.5 K/uL   RBC 4.12 3.87 - 5.11 MIL/uL   Hemoglobin 12.3 12.0 - 15.0 g/dL   HCT 35.6 (L) 36.0 - 46.0 %   MCV 86.4 78.0 - 100.0 fL   MCH 29.9 26.0 - 34.0 pg   MCHC 34.6 30.0 - 36.0 g/dL   RDW 13.7 11.5 - 15.5 %   Platelets 176 150 - 400 K/uL  Urinalysis, Routine w reflex microscopic  Result Value Ref Range   Color, Urine YELLOW YELLOW   APPearance CLEAR CLEAR   Specific Gravity, Urine 1.027 1.005 - 1.030   pH 8.5 (H) 5.0 - 8.0   Glucose, UA NEGATIVE NEGATIVE mg/dL   Hgb urine dipstick NEGATIVE NEGATIVE   Bilirubin Urine NEGATIVE NEGATIVE   Ketones, ur NEGATIVE NEGATIVE mg/dL   Protein, ur 100 (A) NEGATIVE mg/dL   Nitrite NEGATIVE NEGATIVE   Leukocytes, UA NEGATIVE NEGATIVE  Urine microscopic-add on  Result Value Ref Range   Squamous Epithelial / LPF 0-5 (A) NONE SEEN   WBC, UA 0-5 0 - 5 WBC/hpf   RBC / HPF NONE SEEN 0 - 5 RBC/hpf   Bacteria, UA RARE (A) NONE SEEN   Urine-Other MUCOUS PRESENT   I-Stat Beta hCG blood, ED ([facility], [facility], [facility] only)  Result Value Ref Range   I-stat hCG, quantitative 5.2 (H) <5 mIU/mL   Comment 3           Dg Abd Acute W/chest  07/07/2015  CLINICAL DATA:  Vomiting x1 4 days ago. The patient has been vomiting repeatedly today. Upper abdominal and chest pain. Initial encounter. EXAM: DG ABDOMEN ACUTE W/ 1V CHEST COMPARISON:  Chest and two views abdomen 07/31/2014. CT abdomen and pelvis 12/20/2014. FINDINGS: Single view of the chest demonstrates clear lungs and normal heart size. No pneumothorax or pleural effusion. Two views of the abdomen show an unremarkable bowel gas pattern. No free intraperitoneal air. No unexpected abdominal calcification. Cholecystectomy clips noted. IMPRESSION: Negative exam. Electronically Signed   By: Inge Rise M.D.   On: 07/07/2015 18:32    MDM  Strongly doubt pregnancy. With quantitative hCG 5.2. Patient had hCG of 7.5 six months ago,and  is amenorrheic for many years Final diagnoses:  None   Dx #1 abdominal pain #2 nausea, vomiting and diarrhea # mild hypokalemia    Orlie Dakin, MD 07/07/15 1903

## 2015-07-07 NOTE — ED Notes (Signed)
Provider at bedside

## 2015-07-31 DIAGNOSIS — H5210 Myopia, unspecified eye: Secondary | ICD-10-CM | POA: Diagnosis not present

## 2015-08-12 ENCOUNTER — Ambulatory Visit: Payer: Commercial Managed Care - HMO | Admitting: Obstetrics and Gynecology

## 2015-08-16 ENCOUNTER — Ambulatory Visit: Payer: Commercial Managed Care - HMO | Admitting: Obstetrics and Gynecology

## 2015-08-21 ENCOUNTER — Ambulatory Visit (INDEPENDENT_AMBULATORY_CARE_PROVIDER_SITE_OTHER): Payer: Commercial Managed Care - HMO | Admitting: Obstetrics and Gynecology

## 2015-08-21 ENCOUNTER — Encounter: Payer: Self-pay | Admitting: Obstetrics and Gynecology

## 2015-08-21 VITALS — BP 110/60 | Ht 66.0 in | Wt 145.0 lb

## 2015-08-21 DIAGNOSIS — E2839 Other primary ovarian failure: Secondary | ICD-10-CM

## 2015-08-21 DIAGNOSIS — E288 Other ovarian dysfunction: Secondary | ICD-10-CM | POA: Diagnosis not present

## 2015-08-21 MED ORDER — PROGESTERONE MICRONIZED 200 MG PO CAPS: ORAL_CAPSULE | ORAL | 3 refills | Status: DC

## 2015-08-21 MED ORDER — ESTRADIOL 1 MG PO TABS: 1.0000 mg | ORAL_TABLET | Freq: Every day | ORAL | 11 refills | Status: DC

## 2015-08-21 NOTE — Progress Notes (Signed)
Patient ID: Tracey Daniel, female   DOB: 01/28/1980, 35 y.o.   MRN: [id number]    [facility] Visit  @DATE @            Patient name: Tracey Daniel Full MRN [id number]  Date of birth: [birth year]  CC & HPI:  Tracey Daniel is a 35 y.o. female presenting today for f/u of POF. Pt was placed on Estradiol and Prometrium on 05/08/15. She states she has not had a period since starting this medication, but does note some hair loss.   ROS:  Review of Systems  Constitutional:       No complaints    Pertinent History Reviewed:   Reviewed: Significant for premature ovarian failure  Medical         Past Medical History:  Diagnosis Date  . Anemia of other chronic disease 11/29/2012  . Asthma   . Back pain, chronic   . Chronic abdominal pain   . Constipation   . COPD (chronic obstructive pulmonary disease) ([place])   . Cyst of skin    mid spine area  . Depression 2000   h/o suicidal ideation  . Gastric outlet obstruction   . Gastritis   . Gastroparesis   . Migraine   . Migraines   . Nausea and vomiting    recurrent  . Nicotine addiction   . Osteoporosis   . Peptic ulcer disease   . Pyloric spasm 03/30/2011  . Seasonal allergies   . Sinusitis   . Substance abuse 2008   marijuana  . Vitamin D deficiency                               Surgical Hx:    Past Surgical History:  Procedure Laterality Date  . BALLOON DILATION N/A 02/09/2013   Procedure: BALLOON DILATION;  Surgeon: Missy Sabins, MD;  Location: [facility];  Service: Endoscopy;  Laterality: N/A;  . BOTOX INJECTION N/A 02/09/2013   Procedure: BOTOX INJECTION;  Surgeon: Missy Sabins, MD;  Location: [facility];  Service: Endoscopy;  Laterality: N/A;  possible balloon  . CARPAL TUNNEL RELEASE     left hand  . CHOLECYSTECTOMY     ?2002  . COLONOSCOPY WITH PROPOFOL N/A 09/20/2014   RMR: Normal ileo-colonoscopy  . ESOPHAGEAL DILATION  12/25/2010   Procedure: ESOPHAGEAL DILATION;  Surgeon: Dorothyann Peng, MD;   [facility];  Service: Endoscopy;;  . ESOPHAGOGASTRODUODENOSCOPY  12/25/2010   Dorothyann Peng, MD;  moderate gastritis, ?goo secondary to pylorspasm. BX showed reactive gstropathy no h.pyori, SB mucosa with intramucosal lymphocytosis and partial villous blunting (TTG 4.0 normal)  . ESOPHAGOGASTRODUODENOSCOPY N/A 11/14/2012   [id number] hiatal hernia. Abnormal gastric mucosa of  uncertain significance-status post biopsy. Subjectively, patient may have recurrent symptomatic, pylorospasm.  . ESOPHAGOGASTRODUODENOSCOPY (EGD) WITH PROPOFOL N/A 02/09/2013   elongated stomach, partial lower esophageal ring widely patent. No obvious pyloric stenosis s/p Botox  . laser surgery on cervix    . TOE SURGERY     Medications: Reviewed & Updated - see associated section                       Current Outpatient Prescriptions:  .  albuterol (PROVENTIL HFA;VENTOLIN HFA) 108 (90 BASE) MCG/ACT inhaler, Inhale 2 puffs into the lungs every 6 (six) hours as needed. Shortness of breath (Patient taking differently: Inhale 2 puffs into the lungs every  6 (six) hours as needed for wheezing or shortness of breath. Shortness of breath), Disp: 6.7 g, Rfl: 4 .  budesonide-formoterol (SYMBICORT) 80-4.5 MCG/ACT inhaler, Inhale 2 puffs into the lungs 2 (two) times daily. (Patient taking differently: Inhale 2 puffs into the lungs every evening. ), Disp: 1 Inhaler, Rfl: 4 .  calcium-vitamin D (OSCAL WITH D) 500-200 MG-UNIT per tablet, Take 1 tablet by mouth 2 (two) times daily., Disp: , Rfl:  .  clobetasol cream (TEMOVATE) [id number] %, Apply 1 application topically 2 (two) times daily as needed (rash). Apply to rash, Disp: , Rfl:  .  dexlansoprazole (DEXILANT) 60 MG capsule, Take 1 capsule (60 mg total) by mouth daily., Disp: 90 capsule, Rfl: 3 .  DULoxetine (CYMBALTA) 30 MG capsule, Take 1 capsule (30 mg total) by mouth daily., Disp: 30 capsule, Rfl: 3 .  ergocalciferol (VITAMIN D2) 50000 UNITS capsule, Take 1 capsule (50,000  Units total) by mouth once a week. One capsule once weekly, Disp: 12 capsule, Rfl: 1 .  estradiol (ESTRACE VAGINAL) 0.1 MG/GM vaginal cream, Place 2 g vaginally at bedtime. As directed, Disp: , Rfl:  .  estradiol (ESTRACE) 1 MG tablet, Take 1 tablet (1 mg total) by mouth daily., Disp: 30 tablet, Rfl: 11 .  feeding supplement (ENSURE CLINICAL STRENGTH) LIQD, Take 237 mLs by mouth 3 (three) times daily with meals. (Patient taking differently: Take 237 mLs by mouth 2 (two) times daily. ), Disp: 10 Bottle, Rfl: 3 .  gabapentin (NEURONTIN) 100 MG capsule, Take 100 mg by mouth 3 (three) times daily., Disp: , Rfl:  .  HYDROcodone-acetaminophen (NORCO/VICODIN) 5-325 MG tablet, Take 1 tablet by mouth every 4 (four) hours as needed., Disp: 12 tablet, Rfl: 0 .  Linaclotide (LINZESS) 290 MCG CAPS capsule, Take 1 capsule (290 mcg total) by mouth daily., Disp: 30 capsule, Rfl: 5 .  loratadine (CLARITIN) 10 MG tablet, Take 1 tablet (10 mg total) by mouth daily., Disp: 30 tablet, Rfl: 5 .  metoCLOPramide (REGLAN) 10 MG tablet, Take 1 tablet (10 mg total) by mouth every 6 (six) hours., Disp: 30 tablet, Rfl: 0 .  mometasone (NASONEX) 50 MCG/ACT nasal spray, Place 2 sprays into the nose 2 (two) times daily., Disp: 17 g, Rfl: 12 .  NUCYNTA 50 MG TABS tablet, Take 50 mg by mouth every 8 (eight) hours as needed for moderate pain. Reported on 05/08/2015, Disp: , Rfl: 0 .  pantoprazole (PROTONIX) 40 MG tablet, TAKE 1 TABLET BY MOUTH TWICE DAILY BEFORE A MEAL, Disp: 180 tablet, Rfl: 3 .  potassium chloride SA (K-DUR,KLOR-CON) 20 MEQ tablet, Take 1 tablet (20 mEq total) by mouth daily., Disp: 30 tablet, Rfl: 2 .  progesterone (PROMETRIUM) 200 MG capsule, TAKE 1 CAPSULE BY MOUTH EVERY DAY. EVERY THIRD MONTH, JANUARY, APRIL, JULY, AND OCTOBER FOR 2 WEEKS, Disp: 14 capsule, Rfl: 3 .  promethazine (PHENERGAN) 25 MG tablet, Take 1 tablet (25 mg total) by mouth every 6 (six) hours as needed for nausea or vomiting., Disp: 20 tablet,  Rfl: 0 .  sucralfate (CARAFATE) 1 G tablet, Crush one tablet and mix in applesauce, take every morning and at bedtime as needed for stomach burning., Disp: 60 tablet, Rfl: 3 .  tiZANidine (ZANAFLEX) 4 MG tablet, Take 4 mg by mouth every 6 (six) hours as needed for muscle spasms. , Disp: , Rfl:    Social History: Reviewed -  reports that she has been smoking Cigarettes.  She has a 1.50 pack-year smoking history. She has never  used smokeless tobacco.  Objective Findings:  Vitals: Blood pressure 110/60, height 5\' 6"  (1.676 m), weight 145 lb (65.8 kg).   Physical Examination: Discussion only    Assessment & Plan:   A:  1. F/u of premature ov failure 2. No menses on Prometrium and Estradiol   P:  1. Continue 1 mg qd Estradiol and Prometrium 200 mg x2wk every 3rd month  2. F/u yearly or as needed      By signing my name below, I, Hansel Feinstein, attest that this documentation has been prepared under the direction and in the presence of Jonnie Kind, MD. Electronically Signed: Hansel Feinstein, ED Scribe. 08/21/15. 9:10 AM.  ,jfs  I personally performed the services described in this documentation, which was SCRIBED in my presence. The recorded information has been reviewed and considered accurate. It has been edited as necessary during review. Jonnie Kind, MD

## 2015-09-05 ENCOUNTER — Ambulatory Visit (INDEPENDENT_AMBULATORY_CARE_PROVIDER_SITE_OTHER): Payer: Commercial Managed Care - HMO | Admitting: Nurse Practitioner

## 2015-09-05 ENCOUNTER — Encounter: Payer: Self-pay | Admitting: Nurse Practitioner

## 2015-09-05 VITALS — BP 108/66 | HR 61 | Temp 98.1°F | Ht 66.0 in | Wt 143.2 lb

## 2015-09-05 DIAGNOSIS — K219 Gastro-esophageal reflux disease without esophagitis: Secondary | ICD-10-CM | POA: Diagnosis not present

## 2015-09-05 DIAGNOSIS — K311 Adult hypertrophic pyloric stenosis: Secondary | ICD-10-CM | POA: Diagnosis not present

## 2015-09-05 DIAGNOSIS — R112 Nausea with vomiting, unspecified: Secondary | ICD-10-CM

## 2015-09-05 DIAGNOSIS — K313 Pylorospasm, not elsewhere classified: Secondary | ICD-10-CM

## 2015-09-05 MED ORDER — METOCLOPRAMIDE HCL 10 MG PO TABS: 10.0000 mg | ORAL_TABLET | Freq: Four times a day (QID) | ORAL | 1 refills | Status: DC

## 2015-09-05 NOTE — Progress Notes (Signed)
Referring Provider: [name], MD Primary Care Physician:  [name], MD Primary GI:  Dr. Gala Romney  Chief Complaint  Patient presents with  . Abdominal Pain  . Nausea    HPI:   Tracey Daniel is a 35 y.o. female who presents for abdominal burning. She was last seen in our office 05/13/2015. She has a history of pyloric spasm of gastric outlet obstruction requiring Botox injections, also history of constipation. Last injection by Dr. Amedeo Plenty during hospitalization in January 2015 at [facility]. Followed up with Dr. Derrill Kay at [facility]. Noted abnormal gastric emptying study and was referred to pain clinic for abdominal wall syndrome/positive carnet's sign at which point she obtained 100% relief of her pain status post celiac plexus block via splanchnic approach on 03/27/2014. CT is ever 2016 unremarkable.  At her last visit she was feeling well. Noted history of iron infusions and unexplained anemia. He was found to be on both her tonics and Dexilant, recommended stop Dexilant and continue Protonix. Recommended if continued symptoms to proceed with EGD with Botox injections here locally with Dr. Laural Golden and Dr. Gala Romney as assist.  Today she states her symptoms returned about 2 months ago. Seems similar to her previous symptoms. Having abdominal pain "like something is stuck or going down slow." Also with N/V, last emesis this morning and yesterday. Nausea improves some after vomiting. Rare GERD symptoms. Denies hematemesis, hematochezia, melena. Has been eating solid foods and some softer foods. Is out of Reglan. Decreased appetite from her symptoms. No changes in bowel movements. When she has a worsening symptomatic episode will have some shortness of breath "like it catches my breath." Denies syncope, near syncope. Denies any other upper or lower GI symptoms.  Past Medical History:  Diagnosis Date  . Anemia of other chronic disease 11/29/2012  . Asthma   . Back pain, chronic     . Chronic abdominal pain   . Constipation   . COPD (chronic obstructive pulmonary disease) ([place])   . Cyst of skin    mid spine area  . Depression 2000   h/o suicidal ideation  . Gastric outlet obstruction   . Gastritis   . Gastroparesis   . Migraine   . Migraines   . Nausea and vomiting    recurrent  . Nicotine addiction   . Osteoporosis   . Peptic ulcer disease   . Pyloric spasm 03/30/2011  . Seasonal allergies   . Sinusitis   . Substance abuse 2008   marijuana  . Vitamin D deficiency     Past Surgical History:  Procedure Laterality Date  . BALLOON DILATION N/A 02/09/2013   Procedure: BALLOON DILATION;  Surgeon: Missy Sabins, MD;  [facility];  Service: Endoscopy;  Laterality: N/A;  . BOTOX INJECTION N/A 02/09/2013   Procedure: BOTOX INJECTION;  Surgeon: Missy Sabins, MD;  [facility];  Service: Endoscopy;  Laterality: N/A;  possible balloon  . CARPAL TUNNEL RELEASE     left hand  . CHOLECYSTECTOMY     ?2002  . COLONOSCOPY WITH PROPOFOL N/A 09/20/2014   RMR: Normal ileo-colonoscopy  . ESOPHAGEAL DILATION  12/25/2010   Procedure: ESOPHAGEAL DILATION;  Surgeon: Dorothyann Peng, MD;  [facility];  Service: Endoscopy;;  . ESOPHAGOGASTRODUODENOSCOPY  12/25/2010   Dorothyann Peng, MD;  moderate gastritis, ?goo secondary to pylorspasm. BX showed reactive gstropathy no h.pyori, SB mucosa with intramucosal lymphocytosis and partial villous blunting (TTG 4.0 normal)  . ESOPHAGOGASTRODUODENOSCOPY  N/A 11/14/2012   [id number] hiatal hernia. Abnormal gastric mucosa of  uncertain significance-status post biopsy. Subjectively, patient may have recurrent symptomatic, pylorospasm.  . ESOPHAGOGASTRODUODENOSCOPY (EGD) WITH PROPOFOL N/A 02/09/2013   elongated stomach, partial lower esophageal ring widely patent. No obvious pyloric stenosis s/p Botox  . laser surgery on cervix    . TOE SURGERY      Current Outpatient Prescriptions  Medication Sig Dispense Refill   . calcium-vitamin D (OSCAL WITH D) 500-200 MG-UNIT per tablet Take 1 tablet by mouth 2 (two) times daily.    . clobetasol cream (TEMOVATE) AB-123456789 % Apply 1 application topically 2 (two) times daily as needed (rash). Apply to rash    . DULoxetine (CYMBALTA) 30 MG capsule Take 1 capsule (30 mg total) by mouth daily. 30 capsule 3  . ergocalciferol (VITAMIN D2) 50000 UNITS capsule Take 1 capsule (50,000 Units total) by mouth once a week. One capsule once weekly 12 capsule 1  . estradiol (ESTRACE VAGINAL) 0.1 MG/GM vaginal cream Place 2 g vaginally at bedtime. As directed    . estradiol (ESTRACE) 1 MG tablet Take 1 tablet (1 mg total) by mouth daily. 30 tablet 11  . feeding supplement (ENSURE CLINICAL STRENGTH) LIQD Take 237 mLs by mouth 3 (three) times daily with meals. (Patient taking differently: Take 237 mLs by mouth 2 (two) times daily. ) 10 Bottle 3  . gabapentin (NEURONTIN) 100 MG capsule Take 100 mg by mouth 3 (three) times daily.    . Linaclotide (LINZESS) 290 MCG CAPS capsule Take 1 capsule (290 mcg total) by mouth daily. 30 capsule 5  . loratadine (CLARITIN) 10 MG tablet Take 1 tablet (10 mg total) by mouth daily. 30 tablet 5  . mometasone (NASONEX) 50 MCG/ACT nasal spray Place 2 sprays into the nose 2 (two) times daily. 17 g 12  . NUCYNTA 50 MG TABS tablet Take 50 mg by mouth every 8 (eight) hours as needed for moderate pain. Reported on 05/08/2015  0  . pantoprazole (PROTONIX) 40 MG tablet TAKE 1 TABLET BY MOUTH TWICE DAILY BEFORE A MEAL 180 tablet 3  . potassium chloride SA (K-DUR,KLOR-CON) 20 MEQ tablet Take 1 tablet (20 mEq total) by mouth daily. 30 tablet 2  . progesterone (PROMETRIUM) 200 MG capsule TAKE 1 CAPSULE BY MOUTH EVERY DAY. EVERY THIRD MONTH, JANUARY, APRIL, JULY, AND OCTOBER FOR 2 WEEKS 14 capsule 3  . promethazine (PHENERGAN) 25 MG tablet Take 1 tablet (25 mg total) by mouth every 6 (six) hours as needed for nausea or vomiting. 20 tablet 0  . sucralfate (CARAFATE) 1 G tablet  Crush one tablet and mix in applesauce, take every morning and at bedtime as needed for stomach burning. 60 tablet 3  . tiZANidine (ZANAFLEX) 4 MG tablet Take 4 mg by mouth every 6 (six) hours as needed for muscle spasms.     Marland Kitchen albuterol (PROVENTIL HFA;VENTOLIN HFA) 108 (90 BASE) MCG/ACT inhaler Inhale 2 puffs into the lungs every 6 (six) hours as needed. Shortness of breath (Patient taking differently: Inhale 2 puffs into the lungs every 6 (six) hours as needed for wheezing or shortness of breath. Shortness of breath) 6.7 g 4  . budesonide-formoterol (SYMBICORT) 80-4.5 MCG/ACT inhaler Inhale 2 puffs into the lungs 2 (two) times daily. (Patient taking differently: Inhale 2 puffs into the lungs every evening. ) 1 Inhaler 4  . HYDROcodone-acetaminophen (NORCO/VICODIN) 5-325 MG tablet Take 1 tablet by mouth every 4 (four) hours as needed. (Patient not taking: Reported on 09/05/2015) 12  tablet 0   No current facility-administered medications for this visit.     Allergies as of 09/05/2015 - Review Complete 09/05/2015  Allergen Reaction Noted  . Benadryl [diphenhydramine] Anaphylaxis 03/25/2014  . Latex Itching and Other (See Comments) 11/30/2011  . Penicillins      Family History  Problem Relation Age of Onset  . Diabetes Father   . Liver disease Father     liver transplant at [facility], age 42  . Lung cancer Mother 39  . Heart disease Maternal Grandmother   . Parkinson's disease Maternal Grandfather   . Multiple sclerosis Sister 44  . Depression Sister 16    bipolar and schizophrenic  . Alcohol abuse Sister   . Colon cancer Neg Hx     Social History   Social History  . Marital status: Single    Spouse name: N/A  . Number of children: 0  . Years of education: N/A   Occupational History  . disability Unemployed   Social History Main Topics  . Smoking status: Current Every Day Smoker    Packs/day: 0.10    Years: 15.00    Types: Cigarettes  . Smokeless tobacco: Never Used      Comment: smokes two  cigarette daily  . Alcohol use No     Comment: none since July 2016. Periodic heavy drinker for months at a time.  . Drug use: No  . Sexual activity: Yes   Other Topics Concern  . None   Social History Narrative  . None    Review of Systems: General: Negative for anorexia, fever, chills. Eyes: Negative for vision changes.  ENT: Negative for hoarseness, difficulty swallowing. CV: Negative for chest pain, angina, palpitations, peripheral edema.  Respiratory: Negative for dyspnea at rest, cough, sputum, wheezing.  GI: See history of present illness. Endo: Negative for unusual weight change.  Heme: Negative for bruising or bleeding. Allergy: Negative for rash or hives.   Physical Exam: BP 108/66   Pulse 61   Temp 98.1 F (36.7 C) (Oral)   Ht 5\' 6"  (1.676 m)   Wt 143 lb 3.2 oz (65 kg)   BMI 23.11 kg/m  General:   Alert and oriented. Pleasant and cooperative. Well-nourished and well-developed.  Eyes:  Without icterus, sclera clear and conjunctiva pink.  Ears:  Normal auditory acuity. Cardiovascular:  S1, S2 present without murmurs appreciated. Extremities without clubbing or edema. Respiratory:  Clear to auscultation bilaterally. No wheezes, rales, or rhonchi. No distress.  Gastrointestinal:  +BS, soft, and non-distended. Noted moderate epigastric and RUQ TTP. No HSM noted. No guarding or rebound. No masses appreciated.  Rectal:  Deferred  Musculoskalatal:  Symmetrical without gross deformities. Neurologic:  Alert and oriented x4;  grossly normal neurologically. Psych:  Alert and cooperative. Normal mood and affect. Heme/Lymph/Immune: No excessive bruising noted.    09/05/2015 11:23 AM   Disclaimer: This note was dictated with voice recognition software. Similar sounding words can inadvertently be transcribed and may not be corrected upon review.

## 2015-09-05 NOTE — Patient Instructions (Signed)
1. I send a refill of Reglan to your pharmacy. 2. As we discussed, all your having symptoms until we can get her procedure done try to eat soft and liquid foods. These include milkshakes, smoothies, mashed potatoes, and other soft foods that are likely to pass easier. 3. We will call you to schedule your procedure. 4. Return for follow-up in 4 months.

## 2015-09-06 NOTE — Assessment & Plan Note (Signed)
Current symptoms as noted above. I will refill Reglan, soft/liquid diet, return for follow-up in 4 months. Gastroparesis diet recommended and discussed with the patient

## 2015-09-06 NOTE — Assessment & Plan Note (Signed)
Currently well managed. Continue to monitor. Return for follow-up in 4 months.

## 2015-09-06 NOTE — Assessment & Plan Note (Signed)
Nausea and vomiting likely due to gastroparesis, pyloric spasm, gastric outlet obstruction. Botox injections have worked well in the past and potentially could be done locally as noted above. In the meantime I'll refill her Reglan, advise a soft/liquid diet eating small amounts at a time to promote adequate passage. Return for follow-up in 4 months.

## 2015-09-06 NOTE — Assessment & Plan Note (Signed)
Patient with a history of pyloric spasm status post Botox injections at [facility]. Her last injection was January 2015 and at [facility]. At her last visit she was doing well. Currently, however, she has had recurrent symptoms as of 2 months ago. At last visit it was discussed that we could do Botox injections potentially locally to save her from having to travel to [facility]. Discussed this with Neil Crouch the PA last saw her and she will follow-up with Dr. Gala Romney in [place]. Laural Golden to decide if this is still a plan. We'll me know whether we can do the procedure here versus need to go back to [facility] we will call and notify her. Return for follow-up in 4 months.

## 2015-09-09 NOTE — Progress Notes (Signed)
cc'ed to pcp °

## 2015-09-13 ENCOUNTER — Telehealth: Payer: Self-pay | Admitting: Gastroenterology

## 2015-09-13 NOTE — Telephone Encounter (Signed)
Patient has history of pyloric spasm. Previously responded well to EGD with Botox injection of the pylorus. Last injection 2015 with Dr. Amedeo Plenty in [facility] during admission. I have spoke with Dr. Gala Romney and Dr. Laural Golden.   Please schedule patient for EGD with Botox injection of the pylorus (IN THE OR DUE TO POLYPHARMACY) in approximately 2-3 weeks.  Needs to be on Dr. Roseanne Kaufman schedule however Dr. Laural Golden has agreed to assist so we need to coordinate their schedules.  Please let me know when you get her scheduled.

## 2015-09-16 ENCOUNTER — Other Ambulatory Visit: Payer: Self-pay

## 2015-09-16 DIAGNOSIS — K313 Pylorospasm, not elsewhere classified: Secondary | ICD-10-CM

## 2015-09-16 NOTE — Telephone Encounter (Signed)
Called pt and informed of EGD appt and instructions given.

## 2015-09-20 NOTE — Patient Instructions (Addendum)
Tracey Daniel  09/20/2015     @PREFPERIOPPHARMACY @   Your procedure is scheduled on 10/01/2015.  Report to [facility] at 6:15 A.M.  Call this number if you have problems the morning of surgery:  [phone]   Remember:  Do not eat food or drink liquids after midnight.  Take these medicines the morning of surgery with A SIP OF WATER : CYMBALTA, NEURONTIN, VICODIN, LINZESS, CLARITIN, K DUR, PROTONIX, REGLAN AND PHENERGAN. PLEASE USE YOUR INHALERS BEFORE LEAVING HOME AND BRING THEM WITH YOU TO [place].   Do not wear jewelry, make-up or nail polish.  Do not wear lotions, powders, or perfumes, or deoderant.  Do not shave 48 hours prior to surgery.  Men may shave face and neck.  Do not bring valuables to the hospital.  [facility] is not responsible for any belongings or valuables.  Contacts, dentures or bridgework may not be worn into surgery.  Leave your suitcase in the car.  After surgery it may be brought to your room.  For patients admitted to the hospital, discharge time will be determined by your treatment team.  Patients discharged the day of surgery will not be allowed to drive home.   Name and phone number of your driver:   FAMILY Special instructions:  N/A  Please read over the following fact sheets that you were given. Care and Recovery After Surgery   Esophagogastroduodenoscopy Esophagogastroduodenoscopy (EGD) is a procedure that is used to examine the lining of the esophagus, stomach, and first part of the small intestine (duodenum). A long, flexible, lighted tube with a camera attached (endoscope) is inserted down the throat to view these organs. This procedure is done to detect problems or abnormalities, such as inflammation, bleeding, ulcers, or growths, in order to treat them. The procedure lasts 5-20 minutes. It is usually an outpatient procedure, but it may need to be performed in a hospital in emergency cases. LET [facility] CARE PROVIDER KNOW  ABOUT:  Any allergies you have.  All medicines you are taking, including vitamins, herbs, eye drops, creams, and over-the-counter medicines.  Previous problems you or members of your family have had with the use of anesthetics.  Any blood disorders you have.  Previous surgeries you have had.  Medical conditions you have. RISKS AND COMPLICATIONS Generally, this is a safe procedure. However, problems can occur and include:  Infection.  Bleeding.  Tearing (perforation) of the esophagus, stomach, or duodenum.  Difficulty breathing or not being able to breathe.  Excessive sweating.  Spasms of the larynx.  Slowed heartbeat.  Low blood pressure. BEFORE THE PROCEDURE  Do not eat or drink anything after midnight on the night before the procedure or as directed by your health care provider.  Do not take your regular medicines before the procedure if your health care provider asks you not to. Ask your health care provider about changing or stopping those medicines.  If you wear dentures, be prepared to remove them before the procedure.  Arrange for someone to drive you home after the procedure. PROCEDURE  A numbing medicine (local anesthetic) may be sprayed in your throat for comfort and to stop you from gagging or coughing.  You will have an IV tube inserted in a vein in your hand or arm. You will receive medicines and fluids through this tube.  You will be given a medicine to relax you (sedative).  A pain reliever will be given through the IV tube.  A mouth guard may  be placed in your mouth to protect your teeth and to keep you from biting on the endoscope.  You will be asked to lie on your left side.  The endoscope will be inserted down your throat and into your esophagus, stomach, and duodenum.  Air will be put through the endoscope to allow your health care provider to clearly view the lining of your esophagus.  The lining of your esophagus, stomach, and duodenum  will be examined. During the exam, your health care provider may:  Remove tissue to be examined under a microscope (biopsy) for inflammation, infection, or other medical problems.  Remove growths.  Remove objects (foreign bodies) that are stuck.  Treat any bleeding with medicines or other devices that stop tissues from bleeding (hot cautery, clipping devices).  Widen (dilate) or stretch narrowed areas of your esophagus and stomach.  The endoscope will be withdrawn. AFTER THE PROCEDURE  You will be taken to a recovery area for observation. Your blood pressure, heart rate, breathing rate, and blood oxygen level will be monitored often until the medicines you were given have worn off.  Do not eat or drink anything until the numbing medicine has worn off and your gag reflex has returned. You may choke.  Your health care provider should be able to discuss his or her findings with you. It will take longer to discuss the test results if any biopsies were taken.   This information is not intended to replace advice given to you by your health care provider. Make sure you discuss any questions you have with your health care provider.   Document Released: 05/08/2004 Document Revised: 01/26/2014 Document Reviewed: 12/09/2011 Elsevier Interactive Patient Education 2016 [employer]       Smoking Cessation, Tips for Success If you are ready to quit smoking, congratulations! You have chosen to help yourself be healthier. Cigarettes bring nicotine, tar, carbon monoxide, and other irritants into your body. Your lungs, heart, and blood vessels will be able to work better without these poisons. There are many different ways to quit smoking. Nicotine gum, nicotine patches, a nicotine inhaler, or nicotine nasal spray can help with physical craving. Hypnosis, support groups, and medicines help break the habit of smoking. WHAT THINGS CAN I DO TO MAKE QUITTING EASIER?  Here are some tips to help you quit  for good:  Pick a date when you will quit smoking completely. Tell all of your friends and family about your plan to quit on that date.  Do not try to slowly cut down on the number of cigarettes you are smoking. Pick a quit date and quit smoking completely starting on that day.  Throw away all cigarettes.   Clean and remove all ashtrays from your home, work, and car.  On a card, write down your reasons for quitting. Carry the card with you and read it when you get the urge to smoke.  Cleanse your body of nicotine. Drink enough water and fluids to keep your urine clear or pale yellow. Do this after quitting to flush the nicotine from your body.  Learn to predict your moods. Do not let a bad situation be your excuse to have a cigarette. Some situations in your life might tempt you into wanting a cigarette.  Never have "just one" cigarette. It leads to wanting another and another. Remind yourself of your decision to quit.  Change habits associated with smoking. If you smoked while driving or when feeling stressed, try other activities to replace smoking.  Stand up when drinking your coffee. Brush your teeth after eating. Sit in a different chair when you read the paper. Avoid alcohol while trying to quit, and try to drink fewer caffeinated beverages. Alcohol and caffeine may urge you to smoke.  Avoid foods and drinks that can trigger a desire to smoke, such as sugary or spicy foods and alcohol.  Ask people who smoke not to smoke around you.  Have something planned to do right after eating or having a cup of coffee. For example, plan to take a walk or exercise.  Try a relaxation exercise to calm you down and decrease your stress. Remember, you may be tense and nervous for the first 2 weeks after you quit, but this will pass.  Find new activities to keep your hands busy. Play with a pen, coin, or rubber band. Doodle or draw things on paper.  Brush your teeth right after eating. This will  help cut down on the craving for the taste of tobacco after meals. You can also try mouthwash.   Use oral substitutes in place of cigarettes. Try using lemon drops, carrots, cinnamon sticks, or chewing gum. Keep them handy so they are available when you have the urge to smoke.  When you have the urge to smoke, try deep breathing.  Designate your home as a nonsmoking area.  If you are a heavy smoker, ask your health care provider about a prescription for nicotine chewing gum. It can ease your withdrawal from nicotine.  Reward yourself. Set aside the cigarette money you save and buy yourself something nice.  Look for support from others. Join a support group or smoking cessation program. Ask someone at home or at work to help you with your plan to quit smoking.  Always ask yourself, "Do I need this cigarette or is this just a reflex?" Tell yourself, "Today, I choose not to smoke," or "I do not want to smoke." You are reminding yourself of your decision to quit.  Do not replace cigarette smoking with electronic cigarettes (commonly called e-cigarettes). The safety of e-cigarettes is unknown, and some may contain harmful chemicals.  If you relapse, do not give up! Plan ahead and think about what you will do the next time you get the urge to smoke. HOW WILL I FEEL WHEN I QUIT SMOKING? You may have symptoms of withdrawal because your body is used to nicotine (the addictive substance in cigarettes). You may crave cigarettes, be irritable, feel very hungry, cough often, get headaches, or have difficulty concentrating. The withdrawal symptoms are only temporary. They are strongest when you first quit but will go away within 10-14 days. When withdrawal symptoms occur, stay in control. Think about your reasons for quitting. Remind yourself that these are signs that your body is healing and getting used to being without cigarettes. Remember that withdrawal symptoms are easier to treat than the major  diseases that smoking can cause.  Even after the withdrawal is over, expect periodic urges to smoke. However, these cravings are generally short lived and will go away whether you smoke or not. Do not smoke! WHAT RESOURCES ARE AVAILABLE TO HELP ME QUIT SMOKING? Your health care provider can direct you to community resources or hospitals for support, which may include:  Group support.  Education.  Hypnosis.  Therapy.   This information is not intended to replace advice given to you by your health care provider. Make sure you discuss any questions you have with your health care provider.  Document Released: 10/04/2003 Document Revised: 01/26/2014 Document Reviewed: 06/23/2012 Elsevier Interactive Patient Education [employer].

## 2015-09-25 ENCOUNTER — Other Ambulatory Visit: Payer: Self-pay | Admitting: Nurse Practitioner

## 2015-09-25 ENCOUNTER — Telehealth: Payer: Self-pay

## 2015-09-25 ENCOUNTER — Encounter (HOSPITAL_COMMUNITY)
Admission: RE | Admit: 2015-09-25 | Discharge: 2015-09-25 | Disposition: A | Discharge status: Home / Self Care | Payer: Commercial Managed Care - HMO | Source: Ambulatory Visit | Attending: Internal Medicine | Admitting: Internal Medicine

## 2015-09-25 ENCOUNTER — Encounter (HOSPITAL_COMMUNITY): Payer: Self-pay

## 2015-09-25 ENCOUNTER — Other Ambulatory Visit: Payer: Self-pay

## 2015-09-25 DIAGNOSIS — Z01812 Encounter for preprocedural laboratory examination: Secondary | ICD-10-CM | POA: Diagnosis not present

## 2015-09-25 DIAGNOSIS — Z3201 Encounter for pregnancy test, result positive: Secondary | ICD-10-CM | POA: Diagnosis not present

## 2015-09-25 LAB — BASIC METABOLIC PANEL
ANION GAP: 4 — AB (ref 5–15)
BUN: 19 mg/dL (ref 6–20)
CALCIUM: 9.4 mg/dL (ref 8.9–10.3)
CHLORIDE: 105 mmol/L (ref 101–111)
CO2: 27 mmol/L (ref 22–32)
Creatinine, Ser: 0.78 mg/dL (ref 0.44–1.00)
GFR calc Af Amer: >60 mL/min (ref >60)
GFR calc non Af Amer: >60 mL/min (ref >60)
GLUCOSE: 90 mg/dL (ref 65–99)
Potassium: 4.4 mmol/L (ref 3.5–5.1)
Sodium: 136 mmol/L (ref 135–145)

## 2015-09-25 LAB — CBC
HEMATOCRIT: 36.9 % (ref 36.0–46.0)
HEMOGLOBIN: 12.4 g/dL (ref 12.0–15.0)
MCH: 30.5 pg (ref 26.0–34.0)
MCHC: 33.6 g/dL (ref 30.0–36.0)
MCV: 90.9 fL (ref 78.0–100.0)
Platelets: 161 10*3/uL (ref 150–400)
RBC: 4.06 MIL/uL (ref 3.87–5.11)
RDW: 13.2 % (ref 11.5–15.5)
WBC: 6.8 10*3/uL (ref 4.0–10.5)

## 2015-09-25 LAB — HCG, SERUM, QUALITATIVE: Preg, Serum: POSITIVE — AB

## 2015-09-25 NOTE — Telephone Encounter (Addendum)
Talked with Threasa Beards and she talked with RMR about the botox for her EGD and RMR said that he was going to research the dose and get back with [facility].

## 2015-09-25 NOTE — Telephone Encounter (Signed)
Patient pregnancy test weakly positive. Confirmation testing pending. If confirmation of permanent for pregnancy, she will not be getting a procedure/Botox injection.

## 2015-09-26 ENCOUNTER — Other Ambulatory Visit: Payer: Self-pay | Admitting: Obstetrics and Gynecology

## 2015-09-26 ENCOUNTER — Telehealth: Payer: Self-pay | Admitting: Gastroenterology

## 2015-09-26 DIAGNOSIS — Z01818 Encounter for other preprocedural examination: Secondary | ICD-10-CM

## 2015-09-26 LAB — HCG, QUANTITATIVE, PREGNANCY: hCG, Beta Chain, Quant, S: 6 m[IU]/mL — ABNORMAL HIGH (ref <5)

## 2015-09-26 NOTE — Telephone Encounter (Signed)
Dr Laural Golden has been sent a message informing him procedure has been canceled. Thanks

## 2015-09-26 NOTE — Progress Notes (Signed)
Quantitative hcg ordered. Patient has premature ovarian failure, and these patients can have low levels of hypothalmic production of HCG, leading to persistent HCG levels in 5-9 range, rarely higher.  Obtaining serial hcg levels greater than 48 hours apart that show no rise in HCG will clarify the issue. First HCG qualitative ordered on this pt on sample from 9/6

## 2015-09-26 NOTE — Telephone Encounter (Signed)
If patient's procedure gets cancelled, please inform Dr. Laural Golden also since he was assisting Dr. Gala Romney with the procedure.

## 2015-09-26 NOTE — Telephone Encounter (Signed)
Opened in error

## 2015-09-26 NOTE — Telephone Encounter (Signed)
Please let NUR know that this has been canceled.

## 2015-09-27 ENCOUNTER — Telehealth: Payer: Self-pay

## 2015-09-27 DIAGNOSIS — E559 Vitamin D deficiency, unspecified: Secondary | ICD-10-CM

## 2015-09-27 DIAGNOSIS — Z13220 Encounter for screening for lipoid disorders: Secondary | ICD-10-CM | POA: Diagnosis not present

## 2015-09-27 DIAGNOSIS — E785 Hyperlipidemia, unspecified: Secondary | ICD-10-CM | POA: Diagnosis not present

## 2015-09-27 LAB — LIPID PANEL
CHOL/HDL RATIO: 5.1 ratio — AB (ref <=5.0)
Cholesterol: 215 mg/dL — ABNORMAL HIGH (ref 125–200)
HDL: 42 mg/dL — ABNORMAL LOW (ref >=46)
LDL CALC: 147 mg/dL — AB (ref <130)
Triglycerides: 128 mg/dL (ref <150)
VLDL: 26 mg/dL (ref <30)

## 2015-09-27 NOTE — Telephone Encounter (Signed)
Expired labs reordered  

## 2015-09-27 NOTE — Progress Notes (Signed)
Thanks for update and response, noted

## 2015-09-28 LAB — HCG, QUANTITATIVE, PREGNANCY: hCG, Beta Chain, Quant, S: 4 m[IU]/mL

## 2015-09-28 LAB — VITAMIN D 25 HYDROXY (VIT D DEFICIENCY, FRACTURES): VIT D 25 HYDROXY: 42 ng/mL (ref 30–100)

## 2015-10-01 ENCOUNTER — Encounter (HOSPITAL_COMMUNITY): Admission: RE | Payer: Self-pay | Source: Ambulatory Visit

## 2015-10-01 ENCOUNTER — Ambulatory Visit (HOSPITAL_COMMUNITY)
Admission: RE | Admit: 2015-10-01 | Payer: Commercial Managed Care - HMO | Source: Ambulatory Visit | Admitting: Internal Medicine

## 2015-10-01 SURGERY — ESOPHAGOGASTRODUODENOSCOPY (EGD) WITH PROPOFOL
Anesthesia: Monitor Anesthesia Care

## 2015-10-02 ENCOUNTER — Ambulatory Visit (INDEPENDENT_AMBULATORY_CARE_PROVIDER_SITE_OTHER): Payer: Commercial Managed Care - HMO | Admitting: Family Medicine

## 2015-10-02 ENCOUNTER — Encounter: Payer: Self-pay | Admitting: Family Medicine

## 2015-10-02 VITALS — BP 118/70 | HR 89 | Resp 16 | Ht 66.0 in | Wt 144.0 lb

## 2015-10-02 DIAGNOSIS — J302 Other seasonal allergic rhinitis: Secondary | ICD-10-CM

## 2015-10-02 DIAGNOSIS — E785 Hyperlipidemia, unspecified: Secondary | ICD-10-CM | POA: Diagnosis not present

## 2015-10-02 DIAGNOSIS — Z23 Encounter for immunization: Secondary | ICD-10-CM | POA: Diagnosis not present

## 2015-10-02 DIAGNOSIS — G44001 Cluster headache syndrome, unspecified, intractable: Secondary | ICD-10-CM | POA: Diagnosis not present

## 2015-10-02 DIAGNOSIS — J45991 Cough variant asthma: Secondary | ICD-10-CM

## 2015-10-02 DIAGNOSIS — E2839 Other primary ovarian failure: Secondary | ICD-10-CM

## 2015-10-02 DIAGNOSIS — J45909 Unspecified asthma, uncomplicated: Secondary | ICD-10-CM | POA: Diagnosis not present

## 2015-10-02 DIAGNOSIS — F17208 Nicotine dependence, unspecified, with other nicotine-induced disorders: Secondary | ICD-10-CM

## 2015-10-02 DIAGNOSIS — E288 Other ovarian dysfunction: Secondary | ICD-10-CM

## 2015-10-02 MED ORDER — ALBUTEROL SULFATE HFA 108 (90 BASE) MCG/ACT IN AERS: 2.0000 | INHALATION_SPRAY | Freq: Four times a day (QID) | RESPIRATORY_TRACT | 4 refills | Status: DC | PRN

## 2015-10-02 MED ORDER — PRAVASTATIN SODIUM 20 MG PO TABS: 20.0000 mg | ORAL_TABLET | Freq: Every day | ORAL | 4 refills | Status: DC

## 2015-10-02 MED ORDER — MOMETASONE FUROATE 50 MCG/ACT NA SUSP: 2.0000 | Freq: Two times a day (BID) | NASAL | 4 refills | Status: DC

## 2015-10-02 MED ORDER — BUDESONIDE-FORMOTEROL FUMARATE 80-4.5 MCG/ACT IN AERO: 2.0000 | INHALATION_SPRAY | Freq: Two times a day (BID) | RESPIRATORY_TRACT | 4 refills | Status: DC

## 2015-10-02 NOTE — Assessment & Plan Note (Signed)
Patient counseled for approximately 5 minutes regarding the health risks of ongoing nicotine use, specifically all types of cancer, heart disease, stroke and respiratory failure. The options available for help with cessation ,the behavioral changes to assist the process, and the option to either gradully reduce usage  Or abruptly stop.is also discussed. Pt is also encouraged to set specific goals in number of cigarettes used daily, as well as to set a quit date. Current is 5/ day plans to quit today

## 2015-10-02 NOTE — Assessment & Plan Note (Signed)
Intermittent positive hCG tests expected per gyne note. Recently had a low positive test, repeat test is negative will relay this to GI so planned procedure may be carried out. Pt also to call GI

## 2015-10-02 NOTE — Assessment & Plan Note (Signed)
Hyperlipidemia:Low fat diet discussed and encouraged.   Lipid Panel  Lab Results  Component Value Date   CHOL 215 (H) 09/27/2015   HDL 42 (L) 09/27/2015   LDLCALC 147 (H) 09/27/2015   TRIG 128 09/27/2015   CHOLHDL 5.1 (H) 09/27/2015   Start pravachiol

## 2015-10-02 NOTE — Assessment & Plan Note (Addendum)
9 month h/o daily disabling headache accompanied by blurry vision and nnear syncope, neurology to eval, normal neuro exam at visit, of note pt does have endocrine dysfunction

## 2015-10-02 NOTE — Patient Instructions (Addendum)
F/u in 3 month, call if you need me sooner  New is pravachol for hiogh cholesterol, you also need to change eating  QUIT sMOKING tODAY as planned  You are referred to neurology in [place] re  Headache syndrome  Call gI re test planned since rept test is negative  1800 QUIT nOW is daily support  BRING MEDS TO visits pls  Thank you  for choosing [facility]. We consider it a privelige to serve you.  Delivering excellent health care in a caring and  compassionate way is our goal.  Partnering with you,  so that together we can achieve this goal is our strategy.    You Can Quit Smoking If you are ready to quit smoking or are thinking about it, congratulations! You have chosen to help yourself be healthier and live longer! There are lots of different ways to quit smoking. Nicotine gum, nicotine patches, a nicotine inhaler, or nicotine nasal spray can help with physical craving. Hypnosis, support groups, and medicines help break the habit of smoking. TIPS TO GET OFF AND STAY OFF CIGARETTES  Learn to predict your moods. Do not let a bad situation be your excuse to have a cigarette. Some situations in your life might tempt you to have a cigarette.  Ask friends and co-workers not to smoke around you.  Make your home smoke-free.  Never have "just one" cigarette. It leads to wanting another and another. Remind yourself of your decision to quit.  On a card, make a list of your reasons for not smoking. Read it at least the same number of times a day as you have a cigarette. Tell yourself everyday, "I do not want to smoke. I choose not to smoke."  Ask someone at home or work to help you with your plan to quit smoking.  Have something planned after you eat or have a cup of coffee. Take a walk or get other exercise to perk you up. This will help to keep you from overeating.  Try a relaxation exercise to calm you down and decrease your stress. Remember, you may be tense and  nervous the first two weeks after you quit. This will pass.  Find new activities to keep your hands busy. Play with a pen, coin, or rubber band. Doodle or draw things on paper.  Brush your teeth right after eating. This will help cut down the craving for the taste of tobacco after meals. You can try mouthwash too.  Try gum, breath mints, or diet candy to keep something in your mouth. IF YOU SMOKE AND WANT TO QUIT:  Do not stock up on cigarettes. Never buy a carton. Wait until one pack is finished before you buy another.  Never carry cigarettes with you at work or at home.  Keep cigarettes as far away from you as possible. Leave them with someone else.  Never carry matches or a lighter with you.  Ask yourself, "Do I need this cigarette or is this just a reflex?"  Bet with someone that you can quit. Put cigarette money in a piggy bank every morning. If you smoke, you give up the money. If you do not smoke, by the end of the week, you keep the money.  Keep trying. It takes 21 days to change a habit!  Talk to your doctor about using medicines to help you quit. These include nicotine replacement gum, lozenges, or skin patches.   This information is not intended to replace advice given to  you by your health care provider. Make sure you discuss any questions you have with your health care provider.   Document Released: 11/01/2008 Document Revised: 03/30/2011 Document Reviewed: 11/01/2008 Elsevier Interactive Patient Education [employer].

## 2015-10-02 NOTE — Assessment & Plan Note (Signed)
Anticipated  flare of symptoms, medication refilled

## 2015-10-02 NOTE — Assessment & Plan Note (Signed)
Recent flare in symptoms, needs medications sent in same done

## 2015-10-02 NOTE — Progress Notes (Signed)
   Tracey Daniel     MRN: [id number]      DOB: May 04, 1980   HPI Patient in for follow up of recent ED visit. Was supposed to have procedure locally for chronic 3 month abdominal pain, aborted because of positive pregnancy test repeat is negative , I will relay the info Frontal and occipital headache daily, tightness, rated at a 4 , and also reports blurry vision, eyes rolling to back and faint feeling since Dec 2016 Has agreed  To quit smoking Barred from chronic pain management due to failed screening testc/o increased clear nasal drainage and runny nose also wheezing, requests meds be sent for both  ROS Denies recent fever or chills. Denies sinus pressure,  ear pain or sore throat. Denies chest congestion, productive cough . Denies chest pains, palpitations and leg swelling n.   Denies dysuria, frequency, hesitancy or incontinence. Denies depression, anxiety or insomnia. Denies skin break down or rash.   PE  BP 118/70   Pulse 89   Resp 16   Ht 5\' 6"  (1.676 m)   Wt 144 lb (65.3 kg)   LMP  (LMP Unknown)   SpO2 99%   BMI 23.24 kg/m   Patient alert and oriented and in no cardiopulmonary distress.  HEENT: No facial asymmetry, EOMI,   oropharynx pink and moist.  Neck supple no JVD, no mass.  Chest: Clear to auscultation bilaterally.  CVS: S1, S2 no murmurs, no S3.Regular rate.     Ext: No edema  MS: Adequate ROM spine, shoulders, hips and knees.  Skin: Intact, no ulcerations or rash noted.  Psych: Good eye contact, normal affect. Memory intact not anxious or depressed appearing.  CNS: CN 2-12 intact, power,  normal throughout.no focal deficits noted.   Assessment & Plan Hyperlipemia Hyperlipidemia:Low fat diet discussed and encouraged.   Lipid Panel  Lab Results  Component Value Date   CHOL 215 (H) 09/27/2015   HDL 42 (L) 09/27/2015   LDLCALC 147 (H) 09/27/2015   TRIG 128 09/27/2015   CHOLHDL 5.1 (H) 09/27/2015   Start pravachiol   Headache 9  month h/o daily disabling headache accompanied by blurry vision and nnear syncope, neurology to eval, normal neuro exam at visit, of note pt does have endocrine dysfunction  Asthma, cough variant Recent flare in symptoms, needs medications sent in same done  Seasonal allergies Anticipated  flare of symptoms, medication refilled  Premature ovarian failure Intermittent positive hCG tests expected per gyne note. Recently had a low positive test, repeat test is negative will relay this to GI so planned procedure may be carried out. Pt also to call GI  Nicotine dependence Patient counseled for approximately 5 minutes regarding the health risks of ongoing nicotine use, specifically all types of cancer, heart disease, stroke and respiratory failure. The options available for help with cessation ,the behavioral changes to assist the process, and the option to either gradully reduce usage  Or abruptly stop.is also discussed. Pt is also encouraged to set specific goals in number of cigarettes used daily, as well as to set a quit date. Current is 5/ day plans to quit today

## 2015-10-04 ENCOUNTER — Other Ambulatory Visit: Payer: Self-pay

## 2015-10-04 MED ORDER — FLUTICASONE PROPIONATE 50 MCG/ACT NA SUSP: 2.0000 | Freq: Every day | NASAL | 6 refills | Status: DC

## 2015-10-09 ENCOUNTER — Other Ambulatory Visit (HOSPITAL_COMMUNITY): Payer: Self-pay

## 2015-10-09 ENCOUNTER — Encounter (HOSPITAL_COMMUNITY): Payer: Self-pay | Admitting: Emergency Medicine

## 2015-10-09 ENCOUNTER — Emergency Department (HOSPITAL_COMMUNITY)
Admission: EM | Admit: 2015-10-09 | Discharge: 2015-10-09 | Disposition: A | Discharge status: Home / Self Care | Payer: Commercial Managed Care - HMO | Attending: Emergency Medicine | Admitting: Emergency Medicine

## 2015-10-09 DIAGNOSIS — J449 Chronic obstructive pulmonary disease, unspecified: Secondary | ICD-10-CM | POA: Diagnosis not present

## 2015-10-09 DIAGNOSIS — R1012 Left upper quadrant pain: Secondary | ICD-10-CM | POA: Insufficient documentation

## 2015-10-09 DIAGNOSIS — R112 Nausea with vomiting, unspecified: Secondary | ICD-10-CM | POA: Diagnosis not present

## 2015-10-09 DIAGNOSIS — F17210 Nicotine dependence, cigarettes, uncomplicated: Secondary | ICD-10-CM | POA: Diagnosis not present

## 2015-10-09 DIAGNOSIS — Z79899 Other long term (current) drug therapy: Secondary | ICD-10-CM | POA: Insufficient documentation

## 2015-10-09 DIAGNOSIS — J45909 Unspecified asthma, uncomplicated: Secondary | ICD-10-CM | POA: Insufficient documentation

## 2015-10-09 DIAGNOSIS — R109 Unspecified abdominal pain: Secondary | ICD-10-CM

## 2015-10-09 DIAGNOSIS — R197 Diarrhea, unspecified: Secondary | ICD-10-CM | POA: Diagnosis not present

## 2015-10-09 DIAGNOSIS — E876 Hypokalemia: Secondary | ICD-10-CM

## 2015-10-09 LAB — URINALYSIS, ROUTINE W REFLEX MICROSCOPIC
BILIRUBIN URINE: NEGATIVE
GLUCOSE, UA: NEGATIVE mg/dL
HGB URINE DIPSTICK: NEGATIVE
KETONES UR: NEGATIVE mg/dL
Leukocytes, UA: NEGATIVE
NITRITE: NEGATIVE
PH: 8.5 — AB (ref 5.0–8.0)
Protein, ur: 100 mg/dL — AB
SPECIFIC GRAVITY, URINE: 1.024 (ref 1.005–1.030)

## 2015-10-09 LAB — CBC
HEMATOCRIT: 37.5 % (ref 36.0–46.0)
HEMOGLOBIN: 12.8 g/dL (ref 12.0–15.0)
MCH: 29.8 pg (ref 26.0–34.0)
MCHC: 34.1 g/dL (ref 30.0–36.0)
MCV: 87.2 fL (ref 78.0–100.0)
Platelets: 183 10*3/uL (ref 150–400)
RBC: 4.3 MIL/uL (ref 3.87–5.11)
RDW: 13 % (ref 11.5–15.5)
WBC: 15.4 10*3/uL — ABNORMAL HIGH (ref 4.0–10.5)

## 2015-10-09 LAB — COMPREHENSIVE METABOLIC PANEL
ALBUMIN: 5.3 g/dL — AB (ref 3.5–5.0)
ALT: 32 U/L (ref 14–54)
ANION GAP: 12 (ref 5–15)
AST: 37 U/L (ref 15–41)
Alkaline Phosphatase: 85 U/L (ref 38–126)
BUN: 15 mg/dL (ref 6–20)
CHLORIDE: 103 mmol/L (ref 101–111)
CO2: 24 mmol/L (ref 22–32)
Calcium: 10.2 mg/dL (ref 8.9–10.3)
Creatinine, Ser: 0.92 mg/dL (ref 0.44–1.00)
GFR calc Af Amer: >60 mL/min (ref >60)
GFR calc non Af Amer: >60 mL/min (ref >60)
GLUCOSE: 129 mg/dL — AB (ref 65–99)
POTASSIUM: 3.1 mmol/L — AB (ref 3.5–5.1)
SODIUM: 139 mmol/L (ref 135–145)
Total Bilirubin: 0.5 mg/dL (ref 0.3–1.2)
Total Protein: 8.4 g/dL — ABNORMAL HIGH (ref 6.5–8.1)

## 2015-10-09 LAB — URINE MICROSCOPIC-ADD ON
BACTERIA UA: NONE SEEN
RBC / HPF: NONE SEEN RBC/hpf (ref 0–5)
WBC, UA: NONE SEEN WBC/hpf (ref 0–5)

## 2015-10-09 LAB — I-STAT BETA HCG BLOOD, ED (MC, WL, AP ONLY): I-stat hCG, quantitative: <5 m[IU]/mL (ref <5)

## 2015-10-09 LAB — LIPASE, BLOOD: Lipase: 29 U/L (ref 11–51)

## 2015-10-09 MED ORDER — HYDROMORPHONE HCL 1 MG/ML IJ SOLN
1.0000 mg | Freq: Once | INTRAMUSCULAR | Status: AC
  Administered 2015-10-09: 1 mg via INTRAVENOUS
  Filled 2015-10-09: qty 1

## 2015-10-09 MED ORDER — ONDANSETRON 4 MG PO TBDP: 4.0000 mg | ORAL_TABLET | Freq: Three times a day (TID) | ORAL | 0 refills | Status: DC | PRN

## 2015-10-09 MED ORDER — ONDANSETRON HCL 4 MG/2ML IJ SOLN
4.0000 mg | Freq: Once | INTRAMUSCULAR | Status: DC
  Filled 2015-10-09: qty 2

## 2015-10-09 MED ORDER — OXYCODONE-ACETAMINOPHEN 5-325 MG PO TABS
1.0000 | ORAL_TABLET | ORAL | Status: DC | PRN
  Administered 2015-10-09: 1 via ORAL
  Filled 2015-10-09: qty 1

## 2015-10-09 MED ORDER — POTASSIUM CHLORIDE CRYS ER 20 MEQ PO TBCR
40.0000 meq | EXTENDED_RELEASE_TABLET | Freq: Once | ORAL | Status: AC
  Administered 2015-10-09: 40 meq via ORAL
  Filled 2015-10-09: qty 2

## 2015-10-09 MED ORDER — POTASSIUM CHLORIDE CRYS ER 20 MEQ PO TBCR: 20.0000 meq | EXTENDED_RELEASE_TABLET | Freq: Every day | ORAL | 0 refills | Status: DC

## 2015-10-09 MED ORDER — ONDANSETRON 4 MG PO TBDP
4.0000 mg | ORAL_TABLET | Freq: Once | ORAL | Status: AC | PRN
  Administered 2015-10-09: 4 mg via ORAL
  Filled 2015-10-09: qty 1

## 2015-10-09 MED ORDER — ONDANSETRON HCL 4 MG PO TABS
4.0000 mg | ORAL_TABLET | Freq: Once | ORAL | Status: AC
  Administered 2015-10-09: 4 mg via ORAL
  Filled 2015-10-09: qty 1

## 2015-10-09 MED ORDER — METOCLOPRAMIDE HCL 5 MG/ML IJ SOLN
10.0000 mg | Freq: Once | INTRAMUSCULAR | Status: AC
  Administered 2015-10-09: 10 mg via INTRAVENOUS
  Filled 2015-10-09: qty 2

## 2015-10-09 MED ORDER — FAMOTIDINE IN NACL 20-0.9 MG/50ML-% IV SOLN
20.0000 mg | INTRAVENOUS | Status: AC
  Administered 2015-10-09: 20 mg via INTRAVENOUS
  Filled 2015-10-09: qty 50

## 2015-10-09 MED ORDER — FENTANYL CITRATE (PF) 100 MCG/2ML IJ SOLN
50.0000 ug | Freq: Once | INTRAMUSCULAR | Status: AC
  Administered 2015-10-09: 50 ug via INTRAVENOUS
  Filled 2015-10-09: qty 2

## 2015-10-09 MED ORDER — DICYCLOMINE HCL 20 MG PO TABS: 20.0000 mg | ORAL_TABLET | Freq: Two times a day (BID) | ORAL | 0 refills | Status: DC

## 2015-10-09 MED ORDER — SODIUM CHLORIDE 0.9 % IV BOLUS (SEPSIS)
1000.0000 mL | Freq: Once | INTRAVENOUS | Status: AC
  Administered 2015-10-09: 1000 mL via INTRAVENOUS

## 2015-10-09 NOTE — ED Notes (Signed)
Patient given sprite to drink. 

## 2015-10-09 NOTE — ED Provider Notes (Signed)
[facility] DEPT Provider Note   CSN: [id number] Arrival date & time: 10/09/15  1628     History   Chief Complaint Chief Complaint  Patient presents with  . Abdominal Pain  . Emesis    HPI Tracey Neyer Moyd is a 35 y.o. female.  The history is provided by the patient and medical records.  Abdominal Pain   Associated symptoms include diarrhea, nausea and vomiting.  Emesis   Associated symptoms include abdominal pain and diarrhea.     35 year old female with history of chronic abdominal pain, COPD, chronic back pain, gastroparesis, migraine headaches, anemia, presenting to the ED for abdominal pain. Patient reports she gets flareups of abdominal pain every few months. States current flare began this morning and has been persistent throughout the day. She reports pain in her left upper abdomen, nausea, vomiting, diarrhea. She denies any melena or hematochezia. No hematemesis or coffee-ground emesis. No fever or chills. States the last thing she remembers eating was waffles which is not out of the ordinary for her. States she has not had any abnormal food, travel out of the country, or recent antibiotics use. No sick contacts.    Past Medical History:  Diagnosis Date  . Anemia of other chronic disease 11/29/2012  . Asthma   . Back pain, chronic   . Chronic abdominal pain   . Constipation   . COPD (chronic obstructive pulmonary disease) ([place])   . Cyst of skin    mid spine area  . Depression 2000   h/o suicidal ideation  . Gastric outlet obstruction   . Gastritis   . Gastroparesis   . Migraine   . Migraines   . Nausea and vomiting    recurrent  . Nicotine addiction   . Osteoporosis   . Peptic ulcer disease   . Pyloric spasm 03/30/2011  . Seasonal allergies   . Sinusitis   . Substance abuse 2008   marijuana  . Vitamin D deficiency     Patient Active Problem List   Diagnosis Date Noted  . Hyperlipemia 10/02/2015  . Premature ovarian failure 02/04/2015  .  Pregnancy as incidental finding, low positive preg test, considered pituitary production of HCG 02/04/2015  . Nausea with vomiting 11/20/2014  . Abdominal pain, epigastric 08/27/2014  . Vitamin D deficiency 05/16/2014  . Diarrhea 10/19/2013  . Alopecia 08/01/2013  . Asthma, cough variant 12/01/2012  . 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 Nicotine dependence 11/14/2012  . Marijuana use 11/14/2012  . Seasonal allergies 11/14/2012  . Nausea without vomiting 11/09/2012  . Constipation 12/02/2011  . Gastroparesis 05/01/2011  . Pyloric spasm 03/30/2011  . Hip pain, right 03/02/2011  . Anemia 01/30/2011  . Gastric outlet obstruction 01/01/2011  . GERD (gastroesophageal reflux disease) 08/21/2010  . CONSTIPATION 06/04/2009  . Headache 03/13/2008  . AMENORRHEA, SECONDARY 09/27/2007  . ECZEMA 09/27/2007  . Depression 07/01/2007  . BACK PAIN, CHRONIC 07/01/2007  . CLOSED FRACTURE OF METATARSAL BONE 01/27/2007    Past Surgical History:  Procedure Laterality Date  . BALLOON DILATION N/A 02/09/2013   Procedure: BALLOON DILATION;  Surgeon: Missy Sabins, MD;  Location: [facility] END[facility]COPY;  Service: Endoscopy;  Laterality: N/A;  . BOTOX INJECTION N/A 02/09/2013   Procedure: BOTOX INJECTION;  Surgeon: Missy Sabins, MD;  Location: [facility] ENDOSCOPY;  Service: Endoscopy;  Laterality: N/A;  possible balloon  . CARPAL TUNNEL RELEASE     left hand  . CHOLECYSTECTOMY     ?2002  . COLONOSCOPY WITH PROPOFOL  N/A 09/20/2014   RMR: Normal ileo-colonoscopy  . ESOPHAGEAL DILATION  12/25/2010   Procedure: ESOPHAGEAL DILATION;  Surgeon: Dorothyann Peng, MD;  Location: [facility];  Service: Endoscopy;;  . ESOPHAGOGASTRODUODENOSCOPY  12/25/2010   Dorothyann Peng, MD;  moderate gastritis, ?goo secondary to pylorspasm. BX showed reactive gstropathy no h.pyori, SB mucosa with intramucosal lymphocytosis and partial villous blunting (TTG 4.0 normal)  . ESOPHAGOGASTRODUODENOSCOPY N/A 11/14/2012    [id number] hiatal hernia. Abnormal gastric mucosa of  uncertain significance-status post biopsy. Subjectively, patient may have recurrent symptomatic, pylorospasm.  . ESOPHAGOGASTRODUODENOSCOPY (EGD) WITH PROPOFOL N/A 02/09/2013   elongated stomach, partial lower esophageal ring widely patent. No obvious pyloric stenosis s/p Botox  . laser surgery on cervix    . TOE SURGERY      OB History    Gravida Para Term Preterm AB Living   1             SAB TAB Ectopic Multiple Live Births                   Home Medications    Prior to Admission medications   Medication Sig Start Date End Date Taking? Authorizing Provider  albuterol (PROVENTIL HFA;VENTOLIN HFA) 108 (90 Base) MCG/ACT inhaler Inhale 2 puffs into the lungs every 6 (six) hours as needed. Shortness of breath 10/02/15 07/08/18  [name], MD  budesonide-formoterol [facility]) 80-4.5 MCG/ACT inhaler Inhale 2 puffs into the lungs 2 (two) times daily. 10/02/15 07/08/18  [name], MD  calcium-vitamin D (OSCAL WITH D) 500-200 MG-UNIT per tablet Take 1 tablet by mouth 2 (two) times daily.    Historical Provider, MD  clobetasol cream (TEMOVATE) [id number] % Apply 1 application topically 2 (two) times daily as needed (rash). Apply to rash    Historical Provider, MD  DULoxetine (CYMBALTA) 30 MG capsule Take 1 capsule (30 mg total) by mouth daily. 05/16/14   [name], MD  ergocalciferol (VITAMIN D2) 50000 UNITS capsule Take 1 capsule (50,000 Units total) by mouth once a week. One capsule once weekly 05/16/14   [name], MD  estradiol (ESTRACE VAGINAL) 0.1 MG/GM vaginal cream Place 2 g vaginally at bedtime. As directed    Historical Provider, MD  estradiol (ESTRACE) 1 MG tablet Take 1 tablet (1 mg total) by mouth daily. 08/21/15 08/20/16  Jonnie Kind, MD  feeding supplement (ENSURE CLINICAL STRENGTH) LIQD Take 237 mLs by mouth 3 (three) times daily with meals. Patient taking differently: Take 237 mLs by mouth 2 (two) times  daily.  01/01/11   Nishant Dhungel, MD  fluticasone (FLONASE) 50 MCG/ACT nasal spray Place 2 sprays into both nostrils daily. 10/04/15   [name], MD  gabapentin (NEURONTIN) 100 MG capsule Take 100 mg by mouth 3 (three) times daily.    Historical Provider, MD  Linaclotide Rolan Lipa) 290 MCG CAPS capsule Take 1 capsule (290 mcg total) by mouth daily. 08/27/14   Mahala Menghini, PA-C  loratadine (CLARITIN) 10 MG tablet Take 1 tablet (10 mg total) by mouth daily. 10/09/11   [name], MD  metoCLOPramide (REGLAN) 10 MG tablet Take 1 tablet (10 mg total) by mouth every 6 (six) hours. 09/05/15   Carlis Stable, NP  NUCYNTA 50 MG TABS tablet Take 50 mg by mouth every 8 (eight) hours as needed for moderate pain. Reported on 05/08/2015 08/27/14   Historical Provider, MD  pantoprazole (PROTONIX) 40 MG tablet TAKE 1 TABLET BY MOUTH TWICE DAILY BEFORE  A MEAL 04/19/15   Mahala Menghini, PA-C  potassium chloride SA (K-DUR,KLOR-CON) 20 MEQ tablet Take 1 tablet (20 mEq total) by mouth daily. 10/09/15   Baird Cancer, PA-C  pravastatin (PRAVACHOL) 20 MG tablet Take 1 tablet (20 mg total) by mouth daily. 10/02/15   [name], MD  progesterone (PROMETRIUM) 200 MG capsule TAKE 1 CAPSULE BY MOUTH EVERY DAY. EVERY THIRD MONTH, [place], APRIL, [place], AND OCTOBER FOR 2 WEEKS 08/21/15   Jonnie Kind, MD  sucralfate (CARAFATE) 1 G tablet Crush one tablet and mix in applesauce, take every morning and at bedtime as needed for stomach burning. 08/27/14   Mahala Menghini, PA-C    Family History Family History  Problem Relation Age of Onset  . Diabetes Father   . Liver disease Father     liver transplant at [facility], age 39  . Lung cancer Mother 62  . Heart disease Maternal Grandmother   . Parkinson's disease Maternal Grandfather   . Multiple sclerosis Sister 41  . Depression Sister 16    bipolar and schizophrenic  . Alcohol abuse Sister   . Colon cancer Neg Hx     Social History Social History  Substance Use  Topics  . Smoking status: Current Every Day Smoker    Packs/day: 0.10    Years: 15.00    Types: Cigarettes  . Smokeless tobacco: Never Used     Comment: smokes two  cigarette daily  . Alcohol use No     Comment: none since July 2016. Periodic heavy drinker for months at a time.     Allergies   Benadryl [diphenhydramine]; Latex; and Penicillins   Review of Systems Review of Systems  Gastrointestinal: Positive for abdominal pain, diarrhea, nausea and vomiting.  All other systems reviewed and are negative.    Physical Exam Updated Vital Signs BP 120/74 (BP Location: Right Arm)   Pulse 80   Temp 98.9 F (37.2 C) (Oral)   Resp 18   Ht 5\' 6"  (1.676 m)   Wt 65.3 kg   LMP  (LMP Unknown)   SpO2 100%   BMI 23.24 kg/m   Physical Exam  Constitutional: She is oriented to person, place, and time. She appears well-developed and well-nourished.  Spitting into bag and moaning during exam, no active emesis  HENT:  Head: Normocephalic and atraumatic.  Mouth/Throat: Oropharynx is clear and moist.  Eyes: Conjunctivae and EOM are normal. Pupils are equal, round, and reactive to light.  Neck: Normal range of motion.  Cardiovascular: Normal rate, regular rhythm and normal heart sounds.   Pulmonary/Chest: Effort normal and breath sounds normal. No respiratory distress. She has no wheezes.  Abdominal: Soft. Bowel sounds are normal. There is no tenderness.  Endorses pain in left upper abdomen without tenderness, no distention, abdomen is soft, normal bowel sounds, no peritoneal signs  Musculoskeletal: Normal range of motion.  Neurological: She is alert and oriented to person, place, and time.  Skin: Skin is warm and dry.  Psychiatric: She has a normal mood and affect.  Nursing note and vitals reviewed.    ED Treatments / Results  Labs (all labs ordered are listed, but only abnormal results are displayed) Labs Reviewed  COMPREHENSIVE METABOLIC PANEL - Abnormal; Notable for the  following:       Result Value   Potassium 3.1 (*)    Glucose, Bld 129 (*)    Total Protein 8.4 (*)    Albumin 5.3 (*)    All  other components within normal limits  CBC - Abnormal; Notable for the following:    WBC 15.4 (*)    All other components within normal limits  URINALYSIS, ROUTINE W REFLEX MICROSCOPIC (NOT AT [facility]) - Abnormal; Notable for the following:    APPearance CLOUDY (*)    pH 8.5 (*)    Protein, ur 100 (*)    All other components within normal limits  URINE MICROSCOPIC-ADD ON - Abnormal; Notable for the following:    Squamous Epithelial / LPF 0-5 (*)    All other components within normal limits  LIPASE, BLOOD  I-STAT BETA HCG BLOOD, ED ([facility], [facility], [facility] ONLY)    EKG  EKG Interpretation None       Radiology No results found.  Procedures Procedures (including critical care time)  Medications Ordered in ED Medications  oxyCODONE-acetaminophen (PERCOCET/ROXICET) 5-325 MG per tablet 1 tablet (1 tablet Oral Given 10/09/15 1709)  potassium chloride SA (K-DUR,KLOR-CON) CR tablet 40 mEq (not administered)  ondansetron (ZOFRAN-ODT) disintegrating tablet 4 mg (4 mg Oral Given 10/09/15 1708)  sodium chloride 0.9 % bolus 1,000 mL (0 mLs Intravenous Stopped 10/09/15 2017)  metoCLOPramide (REGLAN) injection 10 mg (10 mg Intravenous Given 10/09/15 1859)  famotidine (PEPCID) IVPB 20 mg premix (0 mg Intravenous Stopped 10/09/15 2018)  HYDROmorphone (DILAUDID) injection 1 mg (1 mg Intravenous Given 10/09/15 1922)  fentaNYL (SUBLIMAZE) injection 50 mcg (50 mcg Intravenous Given 10/09/15 2040)     Initial Impression / Assessment and Plan / ED Course  I have reviewed the triage vital signs and the nursing notes.  Pertinent labs & imaging results that were available during my care of the patient were reviewed by me and considered in my medical decision making (see chart for details).  Clinical Course   35 year old female here with abdominal pain, nausea, vomiting or diarrhea. This  seems to be an intermittent issue over the past several months. She is afebrile and nontoxic. Endorses pain of her left upper quadrant, no tenderness or peritoneal signs on exam. Her screening lab work is reassuring-- she does have a leukocytosis which may be reactive from her vomiting. Mild hypokalemia, will replace here. Patient was treated here with IV fluids and medications with significant improvement of her symptoms. Has been resting comfortably, she is tolerating oral fluids without difficulty.  Will discharge home with supportive care.  Rx Bentyl, Zofran. Follow-up with PCP.  Discussed plan with patient, he/she acknowledged understanding and agreed with plan of care.  Return precautions given for new or worsening symptoms.  Final Clinical Impressions(s) / ED Diagnoses   Final diagnoses:  Abdominal pain, unspecified abdominal location  Nausea vomiting and diarrhea    New Prescriptions New Prescriptions   DICYCLOMINE (BENTYL) 20 MG TABLET    Take 1 tablet (20 mg total) by mouth 2 (two) times daily.   ONDANSETRON (ZOFRAN ODT) 4 MG DISINTEGRATING TABLET    Take 1 tablet (4 mg total) by mouth every 8 (eight) hours as needed for nausea.     Larene Pickett, PA-C 10/09/15 2055    Dorie Rank, MD 10/09/15 [phone]

## 2015-10-09 NOTE — ED Triage Notes (Signed)
Patient reports upper abdominal pain/nausea/vomiting/diarrhea starting today.

## 2015-10-09 NOTE — Discharge Instructions (Signed)
Take the prescribed medication as directed. Recommend gentle diet and oral fluids. Follow-up with your primary care doctor. Return to the ED for new or worsening symptoms.

## 2015-10-09 NOTE — ED Notes (Signed)
Pt c/o continuing nausea.

## 2015-10-09 NOTE — Telephone Encounter (Signed)
Refill request for potassium received from patients pharmacy. Chart checked, discussed with PA, and refilled.

## 2015-10-10 ENCOUNTER — Telehealth: Payer: Self-pay | Admitting: Internal Medicine

## 2015-10-10 NOTE — Telephone Encounter (Signed)
Dr.Rourk, is it ok for [place] to reschedule her botox procedure?

## 2015-10-10 NOTE — Telephone Encounter (Signed)
Please reschedule procedure

## 2015-10-10 NOTE — Telephone Encounter (Signed)
PATIENT CALLED AND STATED THAT SHE WAS TO CALL BACK HERE TO RESCHEDULE HER BOTOX INJECTION [phone], IT WAS CANCELLED DO TO A PREGNANCY TEST THAT CAME BACK POSITIVE

## 2015-10-10 NOTE — Telephone Encounter (Signed)
Yes -  schedule. Botox has to be ordered. Make it towards the end of next week.

## 2015-10-14 NOTE — Telephone Encounter (Signed)
Pt needs to be done in the OR and her H&P has run out. Please advise

## 2015-10-14 NOTE — Telephone Encounter (Signed)
Communication noted.  

## 2015-10-14 NOTE — Telephone Encounter (Signed)
Pt is coming in tomorrow  °

## 2015-10-15 ENCOUNTER — Other Ambulatory Visit: Payer: Self-pay

## 2015-10-15 ENCOUNTER — Encounter (HOSPITAL_COMMUNITY): Payer: Self-pay

## 2015-10-15 ENCOUNTER — Emergency Department (HOSPITAL_COMMUNITY)
Admission: EM | Admit: 2015-10-15 | Discharge: 2015-10-15 | Disposition: A | Discharge status: Home / Self Care | Payer: Commercial Managed Care - HMO | Attending: Emergency Medicine | Admitting: Emergency Medicine

## 2015-10-15 ENCOUNTER — Encounter: Payer: Self-pay | Admitting: Internal Medicine

## 2015-10-15 ENCOUNTER — Ambulatory Visit (INDEPENDENT_AMBULATORY_CARE_PROVIDER_SITE_OTHER): Payer: Commercial Managed Care - HMO | Admitting: Internal Medicine

## 2015-10-15 VITALS — BP 124/74 | HR 73 | Temp 97.8°F | Ht 68.0 in | Wt 143.6 lb

## 2015-10-15 DIAGNOSIS — R1013 Epigastric pain: Secondary | ICD-10-CM | POA: Diagnosis not present

## 2015-10-15 DIAGNOSIS — K313 Pylorospasm, not elsewhere classified: Secondary | ICD-10-CM

## 2015-10-15 DIAGNOSIS — F17210 Nicotine dependence, cigarettes, uncomplicated: Secondary | ICD-10-CM | POA: Insufficient documentation

## 2015-10-15 DIAGNOSIS — D72829 Elevated white blood cell count, unspecified: Secondary | ICD-10-CM

## 2015-10-15 DIAGNOSIS — K3184 Gastroparesis: Secondary | ICD-10-CM

## 2015-10-15 DIAGNOSIS — K5909 Other constipation: Secondary | ICD-10-CM | POA: Diagnosis not present

## 2015-10-15 DIAGNOSIS — Z91040 Latex allergy status: Secondary | ICD-10-CM | POA: Insufficient documentation

## 2015-10-15 DIAGNOSIS — J449 Chronic obstructive pulmonary disease, unspecified: Secondary | ICD-10-CM | POA: Insufficient documentation

## 2015-10-15 DIAGNOSIS — Z79899 Other long term (current) drug therapy: Secondary | ICD-10-CM | POA: Insufficient documentation

## 2015-10-15 LAB — URINALYSIS, ROUTINE W REFLEX MICROSCOPIC
BILIRUBIN URINE: NEGATIVE
Glucose, UA: NEGATIVE mg/dL
Hgb urine dipstick: NEGATIVE
KETONES UR: NEGATIVE mg/dL
LEUKOCYTES UA: NEGATIVE
NITRITE: NEGATIVE
PH: 8 (ref 5.0–8.0)
PROTEIN: NEGATIVE mg/dL
Specific Gravity, Urine: 1.012 (ref 1.005–1.030)

## 2015-10-15 LAB — CBC WITH DIFFERENTIAL/PLATELET
BASOS ABS: 0 {cells}/uL (ref 0–200)
Basophils Relative: 0 %
EOS ABS: 86 {cells}/uL (ref 15–500)
Eosinophils Relative: 1 %
HEMATOCRIT: 33.9 % — AB (ref 35.0–45.0)
Hemoglobin: 11.1 g/dL — ABNORMAL LOW (ref 11.7–15.5)
Lymphocytes Relative: 35 %
Lymphs Abs: 3010 cells/uL (ref 850–3900)
MCH: 28.9 pg (ref 27.0–33.0)
MCHC: 32.7 g/dL (ref 32.0–36.0)
MCV: 88.3 fL (ref 80.0–100.0)
MONO ABS: 516 {cells}/uL (ref 200–950)
MONOS PCT: 6 %
MPV: 11.4 fL (ref 7.5–12.5)
NEUTROS ABS: 4988 {cells}/uL (ref 1500–7800)
Neutrophils Relative %: 58 %
PLATELETS: 191 10*3/uL (ref 140–400)
RBC: 3.84 MIL/uL (ref 3.80–5.10)
RDW: 12.8 % (ref 11.0–15.0)
WBC: 8.6 10*3/uL (ref 3.8–10.8)

## 2015-10-15 LAB — CBC
HCT: 35.3 % — ABNORMAL LOW (ref 36.0–46.0)
Hemoglobin: 11.5 g/dL — ABNORMAL LOW (ref 12.0–15.0)
MCH: 29.6 pg (ref 26.0–34.0)
MCHC: 32.6 g/dL (ref 30.0–36.0)
MCV: 91 fL (ref 78.0–100.0)
PLATELETS: 182 10*3/uL (ref 150–400)
RBC: 3.88 MIL/uL (ref 3.87–5.11)
RDW: 12.7 % (ref 11.5–15.5)
WBC: 8.4 10*3/uL (ref 4.0–10.5)

## 2015-10-15 LAB — COMPREHENSIVE METABOLIC PANEL
ALBUMIN: 4.6 g/dL (ref 3.5–5.0)
ALK PHOS: 79 U/L (ref 38–126)
ALT: 15 U/L (ref 14–54)
AST: 18 U/L (ref 15–41)
Anion gap: 8 (ref 5–15)
BILIRUBIN TOTAL: 0.4 mg/dL (ref 0.3–1.2)
BUN: 13 mg/dL (ref 6–20)
CALCIUM: 10.1 mg/dL (ref 8.9–10.3)
CO2: 25 mmol/L (ref 22–32)
CREATININE: 0.86 mg/dL (ref 0.44–1.00)
Chloride: 107 mmol/L (ref 101–111)
GFR calc Af Amer: >60 mL/min (ref >60)
GLUCOSE: 85 mg/dL (ref 65–99)
POTASSIUM: 4.1 mmol/L (ref 3.5–5.1)
Sodium: 140 mmol/L (ref 135–145)
Total Protein: 7.5 g/dL (ref 6.5–8.1)

## 2015-10-15 LAB — PREGNANCY, URINE: Preg Test, Ur: NEGATIVE

## 2015-10-15 LAB — LIPASE, BLOOD: Lipase: 23 U/L (ref 11–51)

## 2015-10-15 MED ORDER — METOCLOPRAMIDE HCL 5 MG/ML IJ SOLN
10.0000 mg | Freq: Once | INTRAMUSCULAR | Status: AC
  Administered 2015-10-15: 10 mg via INTRAVENOUS
  Filled 2015-10-15: qty 2

## 2015-10-15 MED ORDER — FAMOTIDINE IN NACL 20-0.9 MG/50ML-% IV SOLN
20.0000 mg | Freq: Once | INTRAVENOUS | Status: AC
  Administered 2015-10-15: 20 mg via INTRAVENOUS
  Filled 2015-10-15: qty 50

## 2015-10-15 MED ORDER — MORPHINE SULFATE (PF) 4 MG/ML IV SOLN
4.0000 mg | Freq: Once | INTRAVENOUS | Status: AC
  Administered 2015-10-15: 4 mg via INTRAVENOUS
  Filled 2015-10-15: qty 1

## 2015-10-15 MED ORDER — GI COCKTAIL ~~LOC~~
30.0000 mL | Freq: Once | ORAL | Status: AC
  Administered 2015-10-15: 30 mL via ORAL
  Filled 2015-10-15: qty 30

## 2015-10-15 NOTE — Discharge Instructions (Signed)
Follow up with your gastroenterologist regarding pain control. Return to the ED if you experience severe worsening of your symptoms, fevers, blood in stool, difficulty breathing.

## 2015-10-15 NOTE — ED Triage Notes (Signed)
Pt reports ongoing abd pain X2 months. Pt reports she had false positive pregnancy test. Seen by PCP today. She reports abd swelling. Pt also reports vomiting and diarrhea.

## 2015-10-15 NOTE — ED Provider Notes (Signed)
[facility] DEPT Provider Note   CSN: [id number] Arrival date & time: 10/15/15  1323     History   Chief Complaint Chief Complaint  Patient presents with  . Abdominal Pain    HPI Tracey Daniel is a 35 y.o. female with a past medical history of chronic abdominal pain, pylorospasm's, migraines who presents the ED today complaining of abdominal pain. Patient states she's been having epigastric abdominal pain that she describes a constant squeezing sensation for the last 2-3 weeks. She states she saw her GI doctor today Dr.Rourk who is supposed to reschedule Botox injections to her pylorus. Patient originally had this scheduled for the 13th that had a false positive pregnancy test the procedure got canceled. Patient states she asked her GI doctor about pain medication and he stated that there was nothing that he could give her to go home with for pain. Patient has associated nausea but states she has not vomited in the last 3 days. Patient states her pain is exacerbated with eating. She has had very little to eat in the last 2-3 weeks. Patient does have chronic constipation which she takes once S4. Her last bowel movement was this morning. She denies any diarrhea, melena, hematochezia. Patient has not had a period in the last 20 years due to premature menopause. She denies any dysuria or vaginal discharge. No fevers or chills.  HPI  Past Medical History:  Diagnosis Date  . Anemia of other chronic disease 11/29/2012  . Asthma   . Back pain, chronic   . Chronic abdominal pain   . Constipation   . COPD (chronic obstructive pulmonary disease) ([place])   . Cyst of skin    mid spine area  . Depression 2000   h/o suicidal ideation  . Gastric outlet obstruction   . Gastritis   . Gastroparesis   . Migraine   . Migraines   . Nausea and vomiting    recurrent  . Nicotine addiction   . Osteoporosis   . Peptic ulcer disease   . Pyloric spasm 03/30/2011  . Seasonal allergies   .  Sinusitis   . Substance abuse 2008   marijuana  . Vitamin D deficiency     Patient Active Problem List   Diagnosis Date Noted  . Hyperlipemia 10/02/2015  . Premature ovarian failure 02/04/2015  . Pregnancy as incidental finding, low positive preg test, considered pituitary production of HCG 02/04/2015  . Nausea with vomiting 11/20/2014  . Abdominal pain, epigastric 08/27/2014  . Vitamin D deficiency 05/16/2014  . Diarrhea 10/19/2013  . Alopecia 08/01/2013  . Asthma, cough variant 12/01/2012  . 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 Nicotine dependence 11/14/2012  . Marijuana use 11/14/2012  . Seasonal allergies 11/14/2012  . Nausea without vomiting 11/09/2012  . Constipation 12/02/2011  . Gastroparesis 05/01/2011  . Pyloric spasm 03/30/2011  . Hip pain, right 03/02/2011  . Anemia 01/30/2011  . Gastric outlet obstruction 01/01/2011  . GERD (gastroesophageal reflux disease) 08/21/2010  . CONSTIPATION 06/04/2009  . Headache 03/13/2008  . AMENORRHEA, SECONDARY 09/27/2007  . ECZEMA 09/27/2007  . Depression 07/01/2007  . BACK PAIN, CHRONIC 07/01/2007  . CLOSED FRACTURE OF METATARSAL BONE 01/27/2007    Past Surgical History:  Procedure Laterality Date  . BALLOON DILATION N/A 02/09/2013   Procedure: BALLOON DILATION;  Surgeon: Missy Sabins, MD;  Location: [facility];  Service: Endoscopy;  Laterality: N/A;  . BOTOX INJECTION N/A 02/09/2013   Procedure: BOTOX INJECTION;  Surgeon: Missy Sabins,  MD;  Location: [facility];  Service: Endoscopy;  Laterality: N/A;  possible balloon  . CARPAL TUNNEL RELEASE     left hand  . CHOLECYSTECTOMY     ?2002  . COLONOSCOPY WITH PROPOFOL N/A 09/20/2014   RMR: Normal ileo-colonoscopy  . ESOPHAGEAL DILATION  12/25/2010   Procedure: ESOPHAGEAL DILATION;  Surgeon: Dorothyann Peng, MD;  [facility];  Service: Endoscopy;;  . ESOPHAGOGASTRODUODENOSCOPY  12/25/2010   Dorothyann Peng, MD;  moderate gastritis, ?goo  secondary to pylorspasm. BX showed reactive gstropathy no h.pyori, SB mucosa with intramucosal lymphocytosis and partial villous blunting (TTG 4.0 normal)  . ESOPHAGOGASTRODUODENOSCOPY N/A 11/14/2012   [id number] hiatal hernia. Abnormal gastric mucosa of  uncertain significance-status post biopsy. Subjectively, patient may have recurrent symptomatic, pylorospasm.  . ESOPHAGOGASTRODUODENOSCOPY (EGD) WITH PROPOFOL N/A 02/09/2013   elongated stomach, partial lower esophageal ring widely patent. No obvious pyloric stenosis s/p Botox  . laser surgery on cervix    . TOE SURGERY      OB History    Gravida Para Term Preterm AB Living   1             SAB TAB Ectopic Multiple Live Births                   Home Medications    Prior to Admission medications   Medication Sig Start Date End Date Taking? Authorizing Provider  albuterol (PROVENTIL HFA;VENTOLIN HFA) 108 (90 Base) MCG/ACT inhaler Inhale 2 puffs into the lungs every 6 (six) hours as needed. Shortness of breath 10/02/15 07/08/18  [name], MD  budesonide-formoterol [facility]) 80-4.5 MCG/ACT inhaler Inhale 2 puffs into the lungs 2 (two) times daily. 10/02/15 07/08/18  [name], MD  calcium-vitamin D (OSCAL WITH D) 500-200 MG-UNIT per tablet Take 1 tablet by mouth 2 (two) times daily.    Historical Provider, MD  clobetasol cream (TEMOVATE) [id number] % Apply 1 application topically 2 (two) times daily as needed (rash). Apply to rash    Historical Provider, MD  dicyclomine (BENTYL) 20 MG tablet Take 1 tablet (20 mg total) by mouth 2 (two) times daily. 10/09/15   Larene Pickett, PA-C  DULoxetine (CYMBALTA) 30 MG capsule Take 1 capsule (30 mg total) by mouth daily. 05/16/14   [name], MD  ergocalciferol (VITAMIN D2) 50000 UNITS capsule Take 1 capsule (50,000 Units total) by mouth once a week. One capsule once weekly 05/16/14   [name], MD  estradiol (ESTRACE VAGINAL) 0.1 MG/GM vaginal cream Place 2 g vaginally at  bedtime. As directed    Historical Provider, MD  estradiol (ESTRACE) 1 MG tablet Take 1 tablet (1 mg total) by mouth daily. 08/21/15 08/20/16  Jonnie Kind, MD  feeding supplement (ENSURE CLINICAL STRENGTH) LIQD Take 237 mLs by mouth 3 (three) times daily with meals. Patient taking differently: Take 237 mLs by mouth 2 (two) times daily.  01/01/11   Nishant Dhungel, MD  fluticasone (FLONASE) 50 MCG/ACT nasal spray Place 2 sprays into both nostrils daily. 10/04/15   [name], MD  gabapentin (NEURONTIN) 100 MG capsule Take 100 mg by mouth 3 (three) times daily.    Historical Provider, MD  Linaclotide Rolan Lipa) 290 MCG CAPS capsule Take 1 capsule (290 mcg total) by mouth daily. 08/27/14   Mahala Menghini, PA-C  loratadine (CLARITIN) 10 MG tablet Take 1 tablet (10 mg total) by mouth daily. 10/09/11   [name], MD  metoCLOPramide (REGLAN) 10 MG  tablet Take 1 tablet (10 mg total) by mouth every 6 (six) hours. 09/05/15   Carlis Stable, NP  NUCYNTA 50 MG TABS tablet Take 50 mg by mouth every 8 (eight) hours as needed for moderate pain. Reported on 05/08/2015 08/27/14   Historical Provider, MD  ondansetron (ZOFRAN ODT) 4 MG disintegrating tablet Take 1 tablet (4 mg total) by mouth every 8 (eight) hours as needed for nausea. 10/09/15   Larene Pickett, PA-C  pantoprazole (PROTONIX) 40 MG tablet TAKE 1 TABLET BY MOUTH TWICE DAILY BEFORE A MEAL 04/19/15   Mahala Menghini, PA-C  potassium chloride SA (K-DUR,KLOR-CON) 20 MEQ tablet Take 1 tablet (20 mEq total) by mouth daily. 10/09/15   Baird Cancer, PA-C  pravastatin (PRAVACHOL) 20 MG tablet Take 1 tablet (20 mg total) by mouth daily. 10/02/15   [name], MD  progesterone (PROMETRIUM) 200 MG capsule TAKE 1 CAPSULE BY MOUTH EVERY DAY. EVERY THIRD MONTH, [place], APRIL, [place], AND OCTOBER FOR 2 WEEKS 08/21/15   Jonnie Kind, MD  sucralfate (CARAFATE) 1 G tablet Crush one tablet and mix in applesauce, take every morning and at bedtime as needed for  stomach burning. 08/27/14   Mahala Menghini, PA-C    Family History Family History  Problem Relation Age of Onset  . Diabetes Father   . Liver disease Father     liver transplant at [facility], age 88  . Lung cancer Mother 21  . Heart disease Maternal Grandmother   . Parkinson's disease Maternal Grandfather   . Multiple sclerosis Sister 30  . Depression Sister 16    bipolar and schizophrenic  . Alcohol abuse Sister   . Colon cancer Neg Hx     Social History Social History  Substance Use Topics  . Smoking status: Current Every Day Smoker    Packs/day: 0.10    Years: 15.00    Types: Cigarettes  . Smokeless tobacco: Never Used     Comment: smokes two  cigarette daily  . Alcohol use No     Comment: none since July 2016. Periodic heavy drinker for months at a time.     Allergies   Benadryl [diphenhydramine]; Latex; and Penicillins   Review of Systems Review of Systems  All other systems reviewed and are negative.    Physical Exam Updated Vital Signs BP 145/82   Pulse (!) 44   Temp 97.9 F (36.6 C) (Oral)   Resp 18   Ht 5\' 6"  (1.676 m)   Wt 64.9 kg   LMP  (LMP Unknown) Comment: pt reports premature menopause  SpO2 100%   BMI 23.08 kg/m   Physical Exam  Constitutional: She is oriented to person, place, and time. She appears well-developed and well-nourished. No distress.  HENT:  Head: Normocephalic and atraumatic.  Mouth/Throat: No oropharyngeal exudate.  Eyes: Conjunctivae and EOM are normal. Pupils are equal, round, and reactive to light. Right eye exhibits no discharge. Left eye exhibits no discharge. No scleral icterus.  Cardiovascular: Normal rate, regular rhythm, normal heart sounds and intact distal pulses.  Exam reveals no gallop and no friction rub.   No murmur heard. Pulmonary/Chest: Effort normal and breath sounds normal. No respiratory distress. She has no wheezes. She has no rales. She exhibits no tenderness.  Abdominal: Soft. Bowel sounds are normal. She  exhibits no distension and no mass. There is tenderness ( epigastric). There is no rebound and no guarding.  Musculoskeletal: Normal range of motion. She exhibits no edema.  Neurological: She is alert and oriented to person, place, and time.  Skin: Skin is warm and dry. No rash noted. She is not diaphoretic. No erythema. No pallor.  Psychiatric: She has a normal mood and affect. Her behavior is normal.  Nursing note and vitals reviewed.    ED Treatments / Results  Labs (all labs ordered are listed, but only abnormal results are displayed) Labs Reviewed  CBC - Abnormal; Notable for the following:       Result Value   Hemoglobin 11.5 (*)    HCT 35.3 (*)    All other components within normal limits  LIPASE, BLOOD  COMPREHENSIVE METABOLIC PANEL  URINALYSIS, ROUTINE W REFLEX MICROSCOPIC (NOT AT [facility])  PREGNANCY, URINE    EKG  EKG Interpretation None       Radiology No results found.  Procedures Procedures (including critical care time)  Medications Ordered in ED Medications  famotidine (PEPCID) IVPB 20 mg premix (not administered)  morphine 4 MG/ML injection 4 mg (not administered)  gi cocktail (Maalox,Lidocaine,Donnatal) (not administered)  metoCLOPramide (REGLAN) injection 10 mg (not administered)     Initial Impression / Assessment and Plan / ED Course  I have reviewed the triage vital signs and the nursing notes.  Pertinent labs & imaging results that were available during my care of the patient were reviewed by me and considered in my medical decision making (see chart for details).  Clinical Course    29 female with history of chronic abdominal pain due to pylorospasm presents to the ED today complaining of ongoing abdominal pain. Patient appears uncomfortable in the ED but is otherwise nontoxic and nonseptic appearing. Review of patient's records she saw her gastroenterologist today, Dr.Rourk who has planned an outpatient pylorus Botox injection for her  symptoms. Patient states that she asked her gastroenterologist about pain medication today and he did not feel like narcotic pain medication was appropriate for her. Patient states her symptoms are unchanged from earlier today but she did not feel like she could take the pain any longer. General feel any additional imaging is indicated at this time as there is a known cause for current symptoms. Her labs are unremarkable. Pain managed in ED and she is tolerating fluids and food by mouth without difficulty. Will not give pain medication prescription as her GI provider who is managing her for her current symptoms do not feel this was appropriate, will not go against his recommendations. Discussed this with patient who expresses understanding. Patient will follow-up with her GI doctor to further discuss pain management as needed. Return precautions outlined to patient discharge instructions.  Final Clinical Impressions(s) / ED Diagnoses   Final diagnoses:  Epigastric pain  Pylorospasm    New Prescriptions New Prescriptions   No medications on file     Carlos Levering, PA-C 10/16/15 1818    Julianne Rice, MD 10/28/15 1500

## 2015-10-15 NOTE — ED Notes (Signed)
Pt c/o squeezing pain at abdomen x3 weeks. Seen at [facility] today but nurse sent here.

## 2015-10-15 NOTE — Progress Notes (Signed)
Primary Care Physician:  [name], MD Primary Gastroenterologist:  Dr. Gala Romney  Pre-Procedure History & Physical: HPI:  Tracey Daniel is a 35 y.o. female here for follow-up of the pylorospasm spasm /dyspepsia. 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 Started on Reglan when she was seen here last office visit. Scheduled to do off label Botox injections of her pyloric channel which has been successfully performed by Dr. Amedeo Plenty and Enoch in [place] and [place], respectively on multiple occasions. However, pregnancy test came back positive. After some evaluation, Dr. Glo Herring felt it was a false positive test.. No dysphagia. Chronic constipation managed well with Linzess Agent recently stopped because pregnancy test results. Some worsening of abdominal discomfort recently with cessation of Linzess. Martin Majestic to the emergency room on the 20th of this month for nausea and vomiting. Evaluation revealed a low potassium at 3.1 for which she was given supplementation. Pregnancy test negative at that time.  Mild leukocytosis (14K). Was not felt to have a significant acute illness. Because of polypharmacy, EGD planned with propofol. Has had an exacerbation of back and abdominal pain recently.  Past Medical History:  Diagnosis Date  . Anemia of other chronic disease 11/29/2012  . Asthma   . Back pain, chronic   . Chronic abdominal pain   . Constipation   . COPD (chronic obstructive pulmonary disease) ([place])   . Cyst of skin    mid spine area  . Depression 2000   h/o suicidal ideation  . Gastric outlet obstruction   . Gastritis   . Gastroparesis   . Migraine   . Migraines   . Nausea and vomiting    recurrent  . Nicotine addiction   . Osteoporosis   . Peptic ulcer disease   . Pyloric spasm 03/30/2011  . Seasonal allergies   . Sinusitis   . Substance abuse 2008   marijuana  . Vitamin D deficiency     Past Surgical  History:  Procedure Laterality Date  . BALLOON DILATION N/A 02/09/2013   Procedure: BALLOON DILATION;  Surgeon: Missy Sabins, MD;  [facility];  Service: Endoscopy;  Laterality: N/A;  . BOTOX INJECTION N/A 02/09/2013   Procedure: BOTOX INJECTION;  Surgeon: Missy Sabins, MD;  Location: [facility];  Service: Endoscopy;  Laterality: N/A;  possible balloon  . CARPAL TUNNEL RELEASE     left hand  . CHOLECYSTECTOMY     ?2002  . COLONOSCOPY WITH PROPOFOL N/A 09/20/2014   RMR: Normal ileo-colonoscopy  . ESOPHAGEAL DILATION  12/25/2010   Procedure: ESOPHAGEAL DILATION;  Surgeon: Dorothyann Peng, MD;  [facility];  Service: Endoscopy;;  . ESOPHAGOGASTRODUODENOSCOPY  12/25/2010   Dorothyann Peng, MD;  moderate gastritis, ?goo secondary to pylorspasm. BX showed reactive gstropathy no h.pyori, SB mucosa with intramucosal lymphocytosis and partial villous blunting (TTG 4.0 normal)  . ESOPHAGOGASTRODUODENOSCOPY N/A 11/14/2012   [id number] hiatal hernia. Abnormal gastric mucosa of  uncertain significance-status post biopsy. Subjectively, patient may have recurrent symptomatic, pylorospasm.  . ESOPHAGOGASTRODUODENOSCOPY (EGD) WITH PROPOFOL N/A 02/09/2013   elongated stomach, partial lower esophageal ring widely patent. No obvious pyloric stenosis s/p Botox  . laser surgery on cervix    . TOE SURGERY      Prior to Admission medications   Medication Sig Start Date End Date Taking? Authorizing Provider  albuterol (PROVENTIL HFA;VENTOLIN HFA) 108 (90 Base) MCG/ACT inhaler Inhale 2 puffs into the lungs every 6 (six) hours as needed. Shortness of breath  10/02/15 07/08/18 Yes [name], MD  calcium-vitamin D (OSCAL WITH D) 500-200 MG-UNIT per tablet Take 1 tablet by mouth 2 (two) times daily.   Yes Historical Provider, MD  clobetasol cream (TEMOVATE) [id number] % Apply 1 application topically 2 (two) times daily as needed (rash). Apply to rash   Yes Historical Provider, MD  dicyclomine (BENTYL)  20 MG tablet Take 1 tablet (20 mg total) by mouth 2 (two) times daily. 10/09/15  Yes Larene Pickett, PA-C  DULoxetine (CYMBALTA) 30 MG capsule Take 1 capsule (30 mg total) by mouth daily. 05/16/14  Yes [name], MD  ergocalciferol (VITAMIN D2) 50000 UNITS capsule Take 1 capsule (50,000 Units total) by mouth once a week. One capsule once weekly 05/16/14  Yes [name], MD  estradiol (ESTRACE VAGINAL) 0.1 MG/GM vaginal cream Place 2 g vaginally at bedtime. As directed   Yes Historical Provider, MD  estradiol (ESTRACE) 1 MG tablet Take 1 tablet (1 mg total) by mouth daily. 08/21/15 08/20/16 Yes Jonnie Kind, MD  feeding supplement (ENSURE CLINICAL STRENGTH) LIQD Take 237 mLs by mouth 3 (three) times daily with meals. Patient taking differently: Take 237 mLs by mouth 2 (two) times daily.  01/01/11  Yes Nishant Dhungel, MD  fluticasone (FLONASE) 50 MCG/ACT nasal spray Place 2 sprays into both nostrils daily. 10/04/15  Yes [name], MD  gabapentin (NEURONTIN) 100 MG capsule Take 100 mg by mouth 3 (three) times daily.   Yes Historical Provider, MD  Linaclotide (LINZESS) 290 MCG CAPS capsule Take 1 capsule (290 mcg total) by mouth daily. 08/27/14  Yes Mahala Menghini, PA-C  loratadine (CLARITIN) 10 MG tablet Take 1 tablet (10 mg total) by mouth daily. 10/09/11  Yes [name], MD  metoCLOPramide (REGLAN) 10 MG tablet Take 1 tablet (10 mg total) by mouth every 6 (six) hours. 09/05/15  Yes Carlis Stable, NP  NUCYNTA 50 MG TABS tablet Take 50 mg by mouth every 8 (eight) hours as needed for moderate pain. Reported on 05/08/2015 08/27/14  Yes Historical Provider, MD  ondansetron (ZOFRAN ODT) 4 MG disintegrating tablet Take 1 tablet (4 mg total) by mouth every 8 (eight) hours as needed for nausea. 10/09/15  Yes Larene Pickett, PA-C  pantoprazole (PROTONIX) 40 MG tablet TAKE 1 TABLET BY MOUTH TWICE DAILY BEFORE A MEAL 04/19/15  Yes Mahala Menghini, PA-C  potassium chloride SA (K-DUR,KLOR-CON) 20  MEQ tablet Take 1 tablet (20 mEq total) by mouth daily. 10/09/15  Yes Manon Hilding Kefalas, PA-C  pravastatin (PRAVACHOL) 20 MG tablet Take 1 tablet (20 mg total) by mouth daily. 10/02/15  Yes [name], MD  progesterone (PROMETRIUM) 200 MG capsule TAKE 1 CAPSULE BY MOUTH EVERY DAY. EVERY THIRD MONTH, [place], APRIL, [place], AND OCTOBER FOR 2 WEEKS 08/21/15  Yes Jonnie Kind, MD  sucralfate (CARAFATE) 1 G tablet Crush one tablet and mix in applesauce, take every morning and at bedtime as needed for stomach burning. 08/27/14  Yes Mahala Menghini, PA-C  budesonide-formoterol (SYMBICORT) 80-4.5 MCG/ACT inhaler Inhale 2 puffs into the lungs 2 (two) times daily. 10/02/15 07/08/18  [name], MD    Allergies as of 10/15/2015 - Review Complete 10/15/2015  Allergen Reaction Noted  . Benadryl [diphenhydramine] Anaphylaxis 03/25/2014  . Latex Itching and Other (See Comments) 11/30/2011  . Penicillins      Family History  Problem Relation Age of Onset  . Diabetes Father   . Liver disease Father  liver transplant at [facility], age 33  . Lung cancer Mother 36  . Heart disease Maternal Grandmother   . Parkinson's disease Maternal Grandfather   . Multiple sclerosis Sister 34  . Depression Sister 16    bipolar and schizophrenic  . Alcohol abuse Sister   . Colon cancer Neg Hx     Social History   Social History  . Marital status: Single    Spouse name: N/A  . Number of children: 0  . Years of education: N/A   Occupational History  . disability Unemployed   Social History Main Topics  . Smoking status: Current Every Day Smoker    Packs/day: 0.10    Years: 15.00    Types: Cigarettes  . Smokeless tobacco: Never Used     Comment: smokes two  cigarette daily  . Alcohol use No     Comment: none since July 2016. Periodic heavy drinker for months at a time.  . Drug use: No  . Sexual activity: Yes   Other Topics Concern  . Not on file   Social History Narrative  . No narrative on  file    Review of Systems: See HPI, otherwise negative ROS  Physical Exam: BP 124/74   Pulse 73   Temp 97.8 F (36.6 C) (Oral)   Ht 5\' 8"  (1.727 m)   Wt 143 lb 9.6 oz (65.1 kg)   LMP  (LMP Unknown)   BMI 21.83 kg/m  General:   Alert,  Appears not to feel well but not acutely ill or toxic appearing; pleasant and cooperative in NAD Eyes:  Sclera clear, no icterus.   Conjunctiva pink. Ears:  Normal auditory acuity. Neck:  Supple; no masses or thyromegaly. No significant cervical adenopathy. Lungs:  Clear throughout to auscultation.   No wheezes, crackles, or rhonchi. No acute distress. Heart:  Regular rate and rhythm; no murmurs, clicks, rubs,  or gallops. Abdomen:  Non-distended. Positive bowel sounds. Mild diffuse abdominal tenderness without mass rebound or organomegaly. Pulses:  Normal pulses noted. Extremities:  Without clubbing or edema.  Impression:  Pleasant 35 year old lady with history of pylorospasm producing bloating, abdominal discomfort and exacerbation of GERD symptoms along the way from time to time. She has responded nicely to Botox injection in the past. Plan to set her for Botox injection locally.  However, positive blood pregnancy test necessitated further evaluation.  Recently she's had worsening of abdominal discomfort and some back pain which may be related to exacerbation of constipation holding her Linzess.  Unexplained leukocytosis recently - nonspecific.  I have offered the patient an EGD along with off label Botox injection in the near future.The risks, benefits, limitations, alternatives and imponderables have been reviewed with the patient. Questions have been answered. All parties are agreeable.      Recommendations:  Resume Linzess 290  daily  Schedule EGD with Botox injection of the pyloric channel under propofol-ASAP  Continue protonix 40 mg daily  Continue Reglan for now although we may be stopping that medication after EGD.  Recheck CBC  today. If leukocytosis of persists, would consider further evaluation cross-sectional imaging.  Further recommendations to follow.     Notice: This dictation was prepared with Dragon dictation along with smaller phrase technology. Any transcriptional errors that result from this process are unintentional and may not be corrected upon review.

## 2015-10-15 NOTE — Patient Instructions (Addendum)
Resume Linzess 290  daily  Schedule EGD with Botox injection of the pyloric channel under propofol-ASAP  Continue protonix 40 mg daily  Continue Reglan for now although we may be stopping that medication after EGD.  CBC today.    Further recommendations to follow.

## 2015-10-16 NOTE — Telephone Encounter (Signed)
Called pt and informed of pre-op appt 10/21/15 at 1:45pm and EGD scheduled for 10/24/15 at 8:30am.

## 2015-10-21 ENCOUNTER — Encounter (HOSPITAL_COMMUNITY)
Admission: RE | Admit: 2015-10-21 | Discharge: 2015-10-21 | Disposition: A | Discharge status: Home / Self Care | Payer: Medicare HMO | Source: Ambulatory Visit | Attending: Internal Medicine | Admitting: Internal Medicine

## 2015-10-23 ENCOUNTER — Telehealth: Payer: Self-pay | Admitting: Internal Medicine

## 2015-10-23 NOTE — Telephone Encounter (Signed)
100 units should do it. 25 units injected into each of 4 quadrants

## 2015-10-23 NOTE — Telephone Encounter (Signed)
I would touch base with Dr. Gala Romney since he is doing the procedure and is the one that scheduled it. He may no more.

## 2015-10-23 NOTE — Telephone Encounter (Signed)
Talked to [facility] and told her that Threasa Beards was looking into the dose of botox.

## 2015-10-23 NOTE — Telephone Encounter (Signed)
Abbie Sons, RN from [employer] called to say that patient is scheduled for an EGD with Botox tomorrow and needed to know the dose of the botox. They have it in 100u. She asked to speak with LSL. Please call her back at [phone] or [phone]

## 2015-10-24 ENCOUNTER — Ambulatory Visit (HOSPITAL_COMMUNITY): Payer: Medicare HMO | Admitting: Anesthesiology

## 2015-10-24 ENCOUNTER — Encounter (HOSPITAL_COMMUNITY): Payer: Self-pay | Admitting: *Deleted

## 2015-10-24 ENCOUNTER — Ambulatory Visit (HOSPITAL_COMMUNITY)
Admission: RE | Admit: 2015-10-24 | Discharge: 2015-10-24 | Disposition: A | Discharge status: Home / Self Care | Payer: Medicare HMO | Source: Ambulatory Visit | Attending: Internal Medicine | Admitting: Internal Medicine

## 2015-10-24 ENCOUNTER — Encounter (HOSPITAL_COMMUNITY)
Admission: RE | Disposition: A | Discharge status: Home / Self Care | Payer: Self-pay | Source: Ambulatory Visit | Attending: Internal Medicine

## 2015-10-24 DIAGNOSIS — K5909 Other constipation: Secondary | ICD-10-CM | POA: Diagnosis not present

## 2015-10-24 DIAGNOSIS — K449 Diaphragmatic hernia without obstruction or gangrene: Secondary | ICD-10-CM | POA: Insufficient documentation

## 2015-10-24 DIAGNOSIS — Z7951 Long term (current) use of inhaled steroids: Secondary | ICD-10-CM | POA: Insufficient documentation

## 2015-10-24 DIAGNOSIS — K311 Adult hypertrophic pyloric stenosis: Secondary | ICD-10-CM | POA: Diagnosis not present

## 2015-10-24 DIAGNOSIS — E559 Vitamin D deficiency, unspecified: Secondary | ICD-10-CM | POA: Diagnosis not present

## 2015-10-24 DIAGNOSIS — K313 Pylorospasm, not elsewhere classified: Secondary | ICD-10-CM | POA: Diagnosis not present

## 2015-10-24 DIAGNOSIS — F329 Major depressive disorder, single episode, unspecified: Secondary | ICD-10-CM | POA: Insufficient documentation

## 2015-10-24 DIAGNOSIS — J449 Chronic obstructive pulmonary disease, unspecified: Secondary | ICD-10-CM | POA: Insufficient documentation

## 2015-10-24 DIAGNOSIS — F17210 Nicotine dependence, cigarettes, uncomplicated: Secondary | ICD-10-CM | POA: Diagnosis not present

## 2015-10-24 DIAGNOSIS — J45909 Unspecified asthma, uncomplicated: Secondary | ICD-10-CM | POA: Diagnosis not present

## 2015-10-24 DIAGNOSIS — K219 Gastro-esophageal reflux disease without esophagitis: Secondary | ICD-10-CM | POA: Insufficient documentation

## 2015-10-24 DIAGNOSIS — K3189 Other diseases of stomach and duodenum: Secondary | ICD-10-CM

## 2015-10-24 DIAGNOSIS — Z79899 Other long term (current) drug therapy: Secondary | ICD-10-CM | POA: Diagnosis not present

## 2015-10-24 DIAGNOSIS — Z79890 Hormone replacement therapy (postmenopausal): Secondary | ICD-10-CM | POA: Diagnosis not present

## 2015-10-24 HISTORY — PX: BOTOX INJECTION: SHX5754

## 2015-10-24 HISTORY — PX: ESOPHAGOGASTRODUODENOSCOPY (EGD) WITH PROPOFOL: SHX5813

## 2015-10-24 SURGERY — ESOPHAGOGASTRODUODENOSCOPY (EGD) WITH PROPOFOL
Anesthesia: Monitor Anesthesia Care

## 2015-10-24 MED ORDER — MIDAZOLAM HCL 5 MG/5ML IJ SOLN
INTRAMUSCULAR | Status: DC | PRN
  Administered 2015-10-24: 2 mg via INTRAVENOUS

## 2015-10-24 MED ORDER — FENTANYL CITRATE (PF) 100 MCG/2ML IJ SOLN
INTRAMUSCULAR | Status: AC
  Filled 2015-10-24: qty 2

## 2015-10-24 MED ORDER — MIDAZOLAM HCL 2 MG/2ML IJ SOLN
INTRAMUSCULAR | Status: AC
  Filled 2015-10-24: qty 2

## 2015-10-24 MED ORDER — LIDOCAINE HCL (CARDIAC) 10 MG/ML IV SOLN
INTRAVENOUS | Status: DC | PRN
  Administered 2015-10-24: 50 mg via INTRAVENOUS

## 2015-10-24 MED ORDER — SUCCINYLCHOLINE CHLORIDE 20 MG/ML IJ SOLN
INTRAMUSCULAR | Status: AC
  Filled 2015-10-24: qty 1

## 2015-10-24 MED ORDER — MIDAZOLAM HCL 2 MG/2ML IJ SOLN
1.0000 mg | INTRAMUSCULAR | Status: DC | PRN
  Administered 2015-10-24: 2 mg via INTRAVENOUS

## 2015-10-24 MED ORDER — PROPOFOL 500 MG/50ML IV EMUL
INTRAVENOUS | Status: DC | PRN
  Administered 2015-10-24: 150 ug/kg/min via INTRAVENOUS

## 2015-10-24 MED ORDER — LACTATED RINGERS IV SOLN
INTRAVENOUS | Status: DC
  Administered 2015-10-24: 08:00:00 via INTRAVENOUS

## 2015-10-24 MED ORDER — SODIUM CHLORIDE 0.9 % IJ SOLN
INTRAMUSCULAR | Status: DC | PRN
  Administered 2015-10-24: 4 mL via SUBMUCOSAL

## 2015-10-24 MED ORDER — FENTANYL CITRATE (PF) 100 MCG/2ML IJ SOLN
25.0000 ug | INTRAMUSCULAR | Status: AC | PRN
Start: 2015-10-24 — End: 2015-10-24
  Administered 2015-10-24 (×2): 25 ug via INTRAVENOUS

## 2015-10-24 MED ORDER — CHLORHEXIDINE GLUCONATE CLOTH 2 % EX PADS: 6.0000 | MEDICATED_PAD | Freq: Once | CUTANEOUS | Status: DC

## 2015-10-24 MED ORDER — HYDROMORPHONE HCL 1 MG/ML IJ SOLN: 0.2500 mg | INTRAMUSCULAR | Status: DC | PRN

## 2015-10-24 MED ORDER — LIDOCAINE HCL (PF) 1 % IJ SOLN
INTRAMUSCULAR | Status: AC
  Filled 2015-10-24: qty 5

## 2015-10-24 MED ORDER — LIDOCAINE VISCOUS 2 % MT SOLN
5.0000 mL | Freq: Two times a day (BID) | OROMUCOSAL | Status: DC
  Administered 2015-10-24: 3 mL via OROMUCOSAL

## 2015-10-24 MED ORDER — LIDOCAINE VISCOUS 2 % MT SOLN
OROMUCOSAL | Status: AC
  Filled 2015-10-24: qty 15

## 2015-10-24 MED ORDER — PROPOFOL 10 MG/ML IV BOLUS
INTRAVENOUS | Status: AC
  Filled 2015-10-24: qty 40

## 2015-10-24 NOTE — Transfer of Care (Signed)
Immediate Anesthesia Transfer of Care Note  Patient: Tracey Daniel  Procedure(s) Performed: Procedure(s) with comments: ESOPHAGOGASTRODUODENOSCOPY (EGD) WITH PROPOFOL (N/A) - 830  BOTOX INJECTION (N/A)  Patient Location: PACU  Anesthesia Type:MAC  Level of Consciousness: awake, alert , oriented and patient cooperative  Airway & Oxygen Therapy: Patient Spontanous Breathing and Patient connected to nasal cannula oxygen  Post-op Assessment: Report given to RN and Post -op Vital signs reviewed and stable  Post vital signs: Reviewed and stable  Last Vitals:  Vitals:   10/24/15 0900 10/24/15 0905  BP: 123/76   Pulse:    Resp: 18 (!) 22  Temp:      Last Pain:  Vitals:   10/24/15 0909  TempSrc:   PainSc: 5       Patients Stated Pain Goal: 5 ([id number])  Complications: No apparent anesthesia complications

## 2015-10-24 NOTE — Op Note (Signed)
[facility] Patient Name: Edana Tieszen Procedure Date: 10/24/2015 9:07 AM MRN: [id number] Date of Birth: 28-Nov-1980 Attending MD: Norvel Richards , MD CSN: [id number] Age: 35 Admit Type: Outpatient Procedure:                Upper GI endoscopy with Botox injection Indications:              pylorospasm producing functional gastric outlet                            obstruction Providers:                Norvel Richards, MD, Lurline Del, RN Referring MD:              Medicines:                Propofol per Anesthesia Complications:            No immediate complications. Estimated Blood Loss:     Estimated blood loss: none. Estimated blood loss:                            none. Procedure:                Pre-Anesthesia Assessment:                           - Prior to the procedure, a History and Physical                            was performed, and patient medications and                            allergies were reviewed. The patient's tolerance of                            previous anesthesia was also reviewed. The risks                            and benefits of the procedure and the sedation                            options and risks were discussed with the patient.                            All questions were answered, and informed consent                            was obtained. Prior Anticoagulants: The patient has                            taken no previous anticoagulant or antiplatelet                            agents. ASA Grade Assessment: II - A patient with  mild systemic disease. After reviewing the risks                            and benefits, the patient was deemed in                            satisfactory condition to undergo the procedure.                           After obtaining informed consent, the endoscope was                            passed under direct vision. Throughout the                            procedure,  the patient's blood pressure, pulse, and                            oxygen saturations were monitored continuously. The                            [id number] scope was introduced through the                            mouth, and advanced to the second part of duodenum.                            The upper GI endoscopy was accomplished without                            difficulty. The patient tolerated the procedure                            well. Scope In: 9:17:45 AM Scope Out: 9:27:04 AM Total Procedure Duration: 0 hours 9 minutes 19 seconds  Findings:      Normal-appearing esophageal mucosa. Some retained gastric contents and       bile-stained mucus easily suctioned out. Small hiatal hernia.      Diffuse erythematous mucosa was found in the gastric body.       Narrowedpyloric channel. Scope admitted only after pressure applied.       Examination of bulb and second portion revealed no abnormalities.      The pylorus was injected in 4 quadrants with 25 units of Botox each       quadrant. Impression:               - No specimens collected. Moderate Sedation:      Moderate (conscious) sedation was administered by the endoscopy nurse       and supervised by the endoscopist. The following parameters were       monitored: oxygen saturation, heart rate, blood pressure, respiratory       rate, EKG, adequacy of pulmonary ventilation, and response to care.       Total physician intraservice time was 14 minutes. Recommendation:           - Patient has a contact number available for  emergencies. The signs and symptoms of potential                            delayed complications were discussed with the                            patient. Return to normal activities tomorrow.                            Written discharge instructions were provided to the                            patient.                           - Resume gastroparesis diet.                            - Continue present medications.                           - No repeat upper endoscopy.                           - Return to GI office in 8 weeks. Procedure Code(s):        --- Professional ---                           [phone], Esophagogastroduodenoscopy, flexible,                            transoral; diagnostic, including collection of                            specimen(s) by brushing or washing, when performed                            (separate procedure)                           99152, Moderate sedation services provided by the                            same physician or other qualified health care                            professional performing the diagnostic or                            therapeutic service that the sedation supports,                            requiring the presence of an independent trained                            observer to assist in the monitoring of the  patient's level of consciousness and physiological                            status; initial 15 minutes of intraservice time,                            patient age 83 years or older Diagnosis Code(s):        --- Professional ---                           K31.89, Other diseases of stomach and duodenum CPT copyright 2016 American Medical Association. All rights reserved. The codes documented in this report are preliminary and upon coder review may  be revised to meet current compliance requirements. Cristopher Estimable. Allin Frix, MD Norvel Richards, MD 10/24/2015 9:46:09 AM This report has been signed electronically. Number of Addenda: 0

## 2015-10-24 NOTE — Telephone Encounter (Signed)
See RMR response.

## 2015-10-24 NOTE — Discharge Instructions (Addendum)
EGD Discharge instructions Please read the instructions outlined below and refer to this sheet in the next few weeks. These discharge instructions provide you with general information on caring for yourself after you leave the hospital. Your doctor may also give you specific instructions. While your treatment has been planned according to the most current medical practices available, unavoidable complications occasionally occur. If you have any problems or questions after discharge, please call your doctor. ACTIVITY  You may resume your regular activity but move at a slower pace for the next 24 hours.   Take frequent rest periods for the next 24 hours.   Walking will help expel (get rid of) the air and reduce the bloated feeling in your abdomen.   No driving for 24 hours (because of the anesthesia (medicine) used during the test).   You may shower.   Do not sign any important legal documents or operate any machinery for 24 hours (because of the anesthesia used during the test).  NUTRITION  Drink plenty of fluids.   You may resume your normal diet.   Begin with a light meal and progress to your normal diet.   Avoid alcoholic beverages for 24 hours or as instructed by your caregiver.  MEDICATIONS  You may resume your normal medications unless your caregiver tells you otherwise.  WHAT YOU CAN EXPECT TODAY  You may experience abdominal discomfort such as a feeling of fullness or gas pains.  FOLLOW-UP  Your doctor will discuss the results of your test with you.  SEEK IMMEDIATE MEDICAL ATTENTION IF ANY OF THE FOLLOWING OCCUR:  Excessive nausea (feeling sick to your stomach) and/or vomiting.   Severe abdominal pain and distention (swelling).   Trouble swallowing.   Temperature over 101 F (37.8 C).   Rectal bleeding or vomiting of blood.    Gastroparesis diet provided  Continue Protonix and Reglan  Office visit with [place] in 8 weeks.   Gastroparesis Gastroparesis, also  called delayed gastric emptying, is a condition in which food takes longer than normal to empty from the stomach. The condition is usually long-lasting (chronic). CAUSES This condition may be caused by:  An endocrine disorder, such as hypothyroidism or diabetes. Diabetes is the most common cause of this condition.  A nervous system disease, such as Parkinson disease or multiple sclerosis.  Cancer, infection, or surgery of the stomach or vagus nerve.  A connective tissue disorder, such as scleroderma.  Certain medicines. In most cases, the cause is not known. RISK FACTORS This condition is more likely to develop in:  People with certain disorders, including endocrine disorders, eating disorders, amyloidosis, and scleroderma.  People with certain diseases, including Parkinson disease or multiple sclerosis.  People with cancer or infection of the stomach or vagus nerve.  People who have had surgery on the stomach or vagus nerve.  People who take certain medicines.  Women. SYMPTOMS Symptoms of this condition include:  An early feeling of fullness when eating.  Nausea.  Weight loss.  Vomiting.  Heartburn.  Abdominal bloating.  Inconsistent blood glucose levels.  Lack of appetite.  Acid from the stomach coming up into the esophagus (gastroesophageal reflux).  Spasms of the stomach. Symptoms may come and go. DIAGNOSIS This condition is diagnosed with tests, such as:  Tests that check how long it takes food to move through the stomach and intestines. These tests include:  Upper gastrointestinal (GI) series. In this test, X-rays of the intestines are taken after you drink a liquid. The liquid makes  the intestines show up better on the X-rays.  Gastric emptying scintigraphy. In this test, scans are taken after you eat food that contains a small amount of radioactive material.  Wireless capsule GI monitoring system. This test involves swallowing a capsule that records  information about movement through the stomach.  Gastric manometry. This test measures electrical and muscular activity in the stomach. It is done with a thin tube that is passed down the throat and into the stomach.  Endoscopy. This test checks for abnormalities in the lining of the stomach. It is done with a long, thin tube that is passed down the throat and into the stomach.  An ultrasound. This test can help rule out gallbladder disease or pancreatitis as a cause of your symptoms. It uses sound waves to take pictures of the inside of your body. TREATMENT There is no cure for gastroparesis. This condition may be managed with:  Treatment of the underlying condition causing the gastroparesis.  Lifestyle changes, including exercise and dietary changes. Dietary changes can include:  Changes in what and when you eat.  Eating smaller meals more often.  Eating low-fat foods.  Eating low-fiber forms of high-fiber foods, such as cooked vegetables instead of raw vegetables.  Having liquid foods in place of solid foods. Liquid foods are easier to digest.  Medicines. These may be given to control nausea and vomiting and to stimulate stomach muscles.  Getting food through a feeding tube. This may be done in severe cases.  A gastric neurostimulator. This is a device that is inserted into the body with surgery. It helps improve stomach emptying and control nausea and vomiting. HOME CARE INSTRUCTIONS  Follow your health care provider's instructions about exercise and diet.  Take medicines only as directed by your health care provider. SEEK MEDICAL CARE IF:  Your symptoms do not improve with treatment.  You have new symptoms. SEEK IMMEDIATE MEDICAL CARE IF:  You have severe abdominal pain that does not improve with treatment.  You have nausea that does not go away.  You cannot keep fluids down.   This information is not intended to replace advice given to you by your health care  provider. Make sure you discuss any questions you have with your health care provider.   Document Released: 01/05/2005 Document Revised: 05/22/2014 Document Reviewed: 01/01/2014 Elsevier Interactive Patient Education [employer].

## 2015-10-24 NOTE — H&P (View-Only) (Signed)
Primary Care Physician:  [name], MD Primary Gastroenterologist:  Dr. Gala Romney  Pre-Procedure History & Physical: HPI:  Tracey Daniel is a 35 y.o. female here for follow-up of the pylorospasm spasm /dyspepsia. 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 Started on Reglan when she was seen here last office visit. Scheduled to do off label Botox injections of her pyloric channel which has been successfully performed by Dr. Amedeo Plenty and [facility] in [place] and [place], respectively on multiple occasions. However, pregnancy test came back positive. After some evaluation, Dr. Glo Herring felt it was a false positive test.. No dysphagia. Chronic constipation managed well with Linzess Agent recently stopped because pregnancy test results. Some worsening of abdominal discomfort recently with cessation of Linzess. Martin Majestic to the emergency room on the 20th of this month for nausea and vomiting. Evaluation revealed a low potassium at 3.1 for which she was given supplementation. Pregnancy test negative at that time.  Mild leukocytosis (14K). Was not felt to have a significant acute illness. Because of polypharmacy, EGD planned with propofol. Has had an exacerbation of back and abdominal pain recently.  Past Medical History:  Diagnosis Date  . Anemia of other chronic disease 11/29/2012  . Asthma   . Back pain, chronic   . Chronic abdominal pain   . Constipation   . COPD (chronic obstructive pulmonary disease) ([place])   . Cyst of skin    mid spine area  . Depression 2000   h/o suicidal ideation  . Gastric outlet obstruction   . Gastritis   . Gastroparesis   . Migraine   . Migraines   . Nausea and vomiting    recurrent  . Nicotine addiction   . Osteoporosis   . Peptic ulcer disease   . Pyloric spasm 03/30/2011  . Seasonal allergies   . Sinusitis   . Substance abuse 2008   marijuana  . Vitamin D deficiency     Past Surgical  History:  Procedure Laterality Date  . BALLOON DILATION N/A 02/09/2013   Procedure: BALLOON DILATION;  Surgeon: Missy Sabins, MD;  Location: [facility];  Service: Endoscopy;  Laterality: N/A;  . BOTOX INJECTION N/A 02/09/2013   Procedure: BOTOX INJECTION;  Surgeon: Missy Sabins, MD;  Location: [facility]OSCOPY;  Service: Endoscopy;  Laterality: N/A;  possible balloon  . CARPAL TUNNEL RELEASE     left hand  . CHOLECYSTECTOMY     ?2002  . COLONOSCOPY WITH PROPOFOL N/A 09/20/2014   RMR: Normal ileo-colonoscopy  . ESOPHAGEAL DILATION  12/25/2010   Procedure: ESOPHAGEAL DILATION;  Surgeon: Dorothyann Peng, MD;  Location: [facility];  Service: Endoscopy;;  . ESOPHAGOGASTRODUODENOSCOPY  12/25/2010   Dorothyann Peng, MD;  moderate gastritis, ?goo secondary to pylorspasm. BX showed reactive gstropathy no h.pyori, SB mucosa with intramucosal lymphocytosis and partial villous blunting (TTG 4.0 normal)  . ESOPHAGOGASTRODUODENOSCOPY N/A 11/14/2012   [id number] hiatal hernia. Abnormal gastric mucosa of  uncertain significance-status post biopsy. Subjectively, patient may have recurrent symptomatic, pylorospasm.  . ESOPHAGOGASTRODUODENOSCOPY (EGD) WITH PROPOFOL N/A 02/09/2013   elongated stomach, partial lower esophageal ring widely patent. No obvious pyloric stenosis s/p Botox  . laser surgery on cervix    . TOE SURGERY      Prior to Admission medications   Medication Sig Start Date End Date Taking? Authorizing Provider  albuterol (PROVENTIL HFA;VENTOLIN HFA) 108 (90 Base) MCG/ACT inhaler Inhale 2 puffs into the lungs every 6 (six) hours as needed. Shortness of breath  10/02/15 07/08/18 Yes [name], MD  calcium-vitamin D (OSCAL WITH D) 500-200 MG-UNIT per tablet Take 1 tablet by mouth 2 (two) times daily.   Yes Historical Provider, MD  clobetasol cream (TEMOVATE) [id number] % Apply 1 application topically 2 (two) times daily as needed (rash). Apply to rash   Yes Historical Provider, MD  dicyclomine (BENTYL)  20 MG tablet Take 1 tablet (20 mg total) by mouth 2 (two) times daily. 10/09/15  Yes Larene Pickett, PA-C  DULoxetine (CYMBALTA) 30 MG capsule Take 1 capsule (30 mg total) by mouth daily. 05/16/14  Yes [name], MD  ergocalciferol (VITAMIN D2) 50000 UNITS capsule Take 1 capsule (50,000 Units total) by mouth once a week. One capsule once weekly 05/16/14  Yes [name], MD  estradiol (ESTRACE VAGINAL) 0.1 MG/GM vaginal cream Place 2 g vaginally at bedtime. As directed   Yes Historical Provider, MD  estradiol (ESTRACE) 1 MG tablet Take 1 tablet (1 mg total) by mouth daily. 08/21/15 08/20/16 Yes Jonnie Kind, MD  feeding supplement (ENSURE CLINICAL STRENGTH) LIQD Take 237 mLs by mouth 3 (three) times daily with meals. Patient taking differently: Take 237 mLs by mouth 2 (two) times daily.  01/01/11  Yes Nishant Dhungel, MD  fluticasone (FLONASE) 50 MCG/ACT nasal spray Place 2 sprays into both nostrils daily. 10/04/15  Yes [name], MD  gabapentin (NEURONTIN) 100 MG capsule Take 100 mg by mouth 3 (three) times daily.   Yes Historical Provider, MD  Linaclotide (LINZESS) 290 MCG CAPS capsule Take 1 capsule (290 mcg total) by mouth daily. 08/27/14  Yes Mahala Menghini, PA-C  loratadine (CLARITIN) 10 MG tablet Take 1 tablet (10 mg total) by mouth daily. 10/09/11  Yes [name], MD  metoCLOPramide (REGLAN) 10 MG tablet Take 1 tablet (10 mg total) by mouth every 6 (six) hours. 09/05/15  Yes Carlis Stable, NP  NUCYNTA 50 MG TABS tablet Take 50 mg by mouth every 8 (eight) hours as needed for moderate pain. Reported on 05/08/2015 08/27/14  Yes Historical Provider, MD  ondansetron (ZOFRAN ODT) 4 MG disintegrating tablet Take 1 tablet (4 mg total) by mouth every 8 (eight) hours as needed for nausea. 10/09/15  Yes Larene Pickett, PA-C  pantoprazole (PROTONIX) 40 MG tablet TAKE 1 TABLET BY MOUTH TWICE DAILY BEFORE A MEAL 04/19/15  Yes Mahala Menghini, PA-C  potassium chloride SA (K-DUR,KLOR-CON) 20  MEQ tablet Take 1 tablet (20 mEq total) by mouth daily. 10/09/15  Yes Manon Hilding Kefalas, PA-C  pravastatin (PRAVACHOL) 20 MG tablet Take 1 tablet (20 mg total) by mouth daily. 10/02/15  Yes [name], MD  progesterone (PROMETRIUM) 200 MG capsule TAKE 1 CAPSULE BY MOUTH EVERY DAY. EVERY THIRD MONTH, [place], APRIL, [place], AND OCTOBER FOR 2 WEEKS 08/21/15  Yes Jonnie Kind, MD  sucralfate (CARAFATE) 1 G tablet Crush one tablet and mix in applesauce, take every morning and at bedtime as needed for stomach burning. 08/27/14  Yes Mahala Menghini, PA-C  budesonide-formoterol (SYMBICORT) 80-4.5 MCG/ACT inhaler Inhale 2 puffs into the lungs 2 (two) times daily. 10/02/15 07/08/18  [name], MD    Allergies as of 10/15/2015 - Review Complete 10/15/2015  Allergen Reaction Noted  . Benadryl [diphenhydramine] Anaphylaxis 03/25/2014  . Latex Itching and Other (See Comments) 11/30/2011  . Penicillins      Family History  Problem Relation Age of Onset  . Diabetes Father   . Liver disease Father  liver transplant at [facility], age 21  . Lung cancer Mother 37  . Heart disease Maternal Grandmother   . Parkinson's disease Maternal Grandfather   . Multiple sclerosis Sister 46  . Depression Sister 16    bipolar and schizophrenic  . Alcohol abuse Sister   . Colon cancer Neg Hx     Social History   Social History  . Marital status: Single    Spouse name: N/A  . Number of children: 0  . Years of education: N/A   Occupational History  . disability Unemployed   Social History Main Topics  . Smoking status: Current Every Day Smoker    Packs/day: 0.10    Years: 15.00    Types: Cigarettes  . Smokeless tobacco: Never Used     Comment: smokes two  cigarette daily  . Alcohol use No     Comment: none since July 2016. Periodic heavy drinker for months at a time.  . Drug use: No  . Sexual activity: Yes   Other Topics Concern  . Not on file   Social History Narrative  . No narrative on  file    Review of Systems: See HPI, otherwise negative ROS  Physical Exam: BP 124/74   Pulse 73   Temp 97.8 F (36.6 C) (Oral)   Ht 5\' 8"  (1.727 m)   Wt 143 lb 9.6 oz (65.1 kg)   LMP  (LMP Unknown)   BMI 21.83 kg/m  General:   Alert,  Appears not to feel well but not acutely ill or toxic appearing; pleasant and cooperative in NAD Eyes:  Sclera clear, no icterus.   Conjunctiva pink. Ears:  Normal auditory acuity. Neck:  Supple; no masses or thyromegaly. No significant cervical adenopathy. Lungs:  Clear throughout to auscultation.   No wheezes, crackles, or rhonchi. No acute distress. Heart:  Regular rate and rhythm; no murmurs, clicks, rubs,  or gallops. Abdomen:  Non-distended. Positive bowel sounds. Mild diffuse abdominal tenderness without mass rebound or organomegaly. Pulses:  Normal pulses noted. Extremities:  Without clubbing or edema.  Impression:  Pleasant 35 year old lady with history of pylorospasm producing bloating, abdominal discomfort and exacerbation of GERD symptoms along the way from time to time. She has responded nicely to Botox injection in the past. Plan to set her for Botox injection locally.  However, positive blood pregnancy test necessitated further evaluation.  Recently she's had worsening of abdominal discomfort and some back pain which may be related to exacerbation of constipation holding her Linzess.  Unexplained leukocytosis recently - nonspecific.  I have offered the patient an EGD along with off label Botox injection in the near future.The risks, benefits, limitations, alternatives and imponderables have been reviewed with the patient. Questions have been answered. All parties are agreeable.      Recommendations:  Resume Linzess 290  daily  Schedule EGD with Botox injection of the pyloric channel under propofol-ASAP  Continue protonix 40 mg daily  Continue Reglan for now although we may be stopping that medication after EGD.  Recheck CBC  today. If leukocytosis of persists, would consider further evaluation cross-sectional imaging.  Further recommendations to follow.     Notice: This dictation was prepared with Dragon dictation along with smaller phrase technology. Any transcriptional errors that result from this process are unintentional and may not be corrected upon review.

## 2015-10-24 NOTE — Anesthesia Preprocedure Evaluation (Signed)
Anesthesia Evaluation  Patient identified by MRN, date of birth, ID band Patient awake    Reviewed: Allergy & Precautions, H&P , NPO status , Patient's Chart, lab work & pertinent test results  Airway Mallampati: II  TM Distance: >3 FB Neck ROM: Full    Dental no notable dental hx. (+) Teeth Intact   Pulmonary asthma , COPD, Current Smoker,    Pulmonary exam normal breath sounds clear to auscultation       Cardiovascular negative cardio ROS Normal cardiovascular exam Rhythm:Regular Rate:Normal     Neuro/Psych  Headaches, PSYCHIATRIC DISORDERS Depression negative neurological ROS  negative psych ROS   GI/Hepatic negative GI ROS, PUD, GERD (pyloric spasms)  ,  Endo/Other  Hypothyroidism   Renal/GU      Musculoskeletal   Abdominal   Peds  Hematology  (+) anemia ,   Anesthesia Other Findings   Reproductive/Obstetrics                             Anesthesia Physical Anesthesia Plan  ASA: III  Anesthesia Plan: MAC   Post-op Pain Management:    Induction: Intravenous  Airway Management Planned: Simple Face Mask  Additional Equipment:   Intra-op Plan:   Post-operative Plan:   Informed Consent: I have reviewed the patients History and Physical, chart, labs and discussed the procedure including the risks, benefits and alternatives for the proposed anesthesia with the patient or authorized representative who has indicated his/her understanding and acceptance.     Plan Discussed with:   Anesthesia Plan Comments:         Anesthesia Quick Evaluation

## 2015-10-24 NOTE — Anesthesia Postprocedure Evaluation (Signed)
Anesthesia Post Note  Patient: Tracey Daniel  Procedure(s) Performed: Procedure(s) (LRB): ESOPHAGOGASTRODUODENOSCOPY (EGD) WITH PROPOFOL (N/A) BOTOX INJECTION (N/A)  Patient location during evaluation: PACU Anesthesia Type: MAC Level of consciousness: awake and alert and oriented Pain management: pain level controlled Vital Signs Assessment: post-procedure vital signs reviewed and stable Respiratory status: spontaneous breathing Cardiovascular status: stable Postop Assessment: no signs of nausea or vomiting Anesthetic complications: no    Last Vitals:  Vitals:   10/24/15 0900 10/24/15 0905  BP: 123/76   Pulse:    Resp: 18 (!) 22  Temp:      Last Pain:  Vitals:   10/24/15 0909  TempSrc:   PainSc: 5                  Madeleine Fenn A

## 2015-10-24 NOTE — Interval H&P Note (Signed)
History and Physical Interval Note:  10/24/2015 8:59 AM  Tracey Daniel  has presented today for surgery, with the diagnosis of pyloric spams  The various methods of treatment have been discussed with the patient and family. After consideration of risks, benefits and other options for treatment, the patient has consented to  Procedure(s) with comments: ESOPHAGOGASTRODUODENOSCOPY (EGD) WITH PROPOFOL (N/A) - 830  BOTOX INJECTION (N/A) as a surgical intervention .  The patient's history has been reviewed, patient examined, no change in status, stable for surgery.  I have reviewed the patient's chart and labs.  Questions were answered to the patient's satisfaction.     Tracey Daniel  No change; EGD w bo tox injection per plan;  The risks, benefits, limitations, alternatives and imponderables have been reviewed with the patient. Potential for esophageal dilation, biopsy, etc. have also been reviewed.  Questions have been answered. All parties agreeable.

## 2015-10-24 NOTE — Anesthesia Procedure Notes (Signed)
Procedure Name: MAC Date/Time: 10/24/2015 9:07 AM Performed by: Andree Elk, Peyten Punches A Pre-anesthesia Checklist: Patient identified, Emergency Drugs available, Suction available, Patient being monitored and Timeout performed Oxygen Delivery Method: Simple face mask

## 2015-10-29 ENCOUNTER — Encounter (HOSPITAL_COMMUNITY): Payer: Self-pay | Admitting: Internal Medicine

## 2015-11-04 ENCOUNTER — Other Ambulatory Visit (HOSPITAL_COMMUNITY): Payer: Self-pay | Admitting: Emergency Medicine

## 2015-11-04 DIAGNOSIS — E876 Hypokalemia: Secondary | ICD-10-CM

## 2015-11-04 MED ORDER — POTASSIUM CHLORIDE CRYS ER 20 MEQ PO TBCR: 20.0000 meq | EXTENDED_RELEASE_TABLET | Freq: Every day | ORAL | 0 refills | Status: DC

## 2015-11-07 DIAGNOSIS — G43719 Chronic migraine without aura, intractable, without status migrainosus: Secondary | ICD-10-CM | POA: Diagnosis not present

## 2015-11-07 DIAGNOSIS — Z049 Encounter for examination and observation for unspecified reason: Secondary | ICD-10-CM | POA: Diagnosis not present

## 2015-11-12 ENCOUNTER — Ambulatory Visit: Payer: Medicare Other | Admitting: Gastroenterology

## 2015-11-12 DIAGNOSIS — G43719 Chronic migraine without aura, intractable, without status migrainosus: Secondary | ICD-10-CM | POA: Diagnosis not present

## 2015-11-14 DIAGNOSIS — M542 Cervicalgia: Secondary | ICD-10-CM | POA: Diagnosis not present

## 2015-11-14 DIAGNOSIS — R51 Headache: Secondary | ICD-10-CM | POA: Diagnosis not present

## 2015-11-14 DIAGNOSIS — G43719 Chronic migraine without aura, intractable, without status migrainosus: Secondary | ICD-10-CM | POA: Diagnosis not present

## 2015-11-14 DIAGNOSIS — M791 Myalgia: Secondary | ICD-10-CM | POA: Diagnosis not present

## 2015-11-15 ENCOUNTER — Other Ambulatory Visit (HOSPITAL_COMMUNITY): Payer: Medicare Other

## 2015-11-15 ENCOUNTER — Ambulatory Visit (HOSPITAL_COMMUNITY): Payer: Medicare Other | Admitting: Oncology

## 2015-11-26 DIAGNOSIS — G43719 Chronic migraine without aura, intractable, without status migrainosus: Secondary | ICD-10-CM | POA: Diagnosis not present

## 2015-11-28 ENCOUNTER — Ambulatory Visit (HOSPITAL_COMMUNITY): Payer: Commercial Managed Care - HMO | Admitting: Oncology

## 2015-11-28 ENCOUNTER — Telehealth: Payer: Self-pay

## 2015-11-28 ENCOUNTER — Other Ambulatory Visit (HOSPITAL_COMMUNITY): Payer: Commercial Managed Care - HMO

## 2015-11-28 NOTE — Telephone Encounter (Signed)
error 

## 2015-11-29 ENCOUNTER — Other Ambulatory Visit (HOSPITAL_COMMUNITY): Payer: Commercial Managed Care - HMO

## 2015-11-29 ENCOUNTER — Ambulatory Visit (HOSPITAL_COMMUNITY): Payer: Commercial Managed Care - HMO | Admitting: Oncology

## 2015-12-02 DIAGNOSIS — G5600 Carpal tunnel syndrome, unspecified upper limb: Secondary | ICD-10-CM | POA: Diagnosis not present

## 2015-12-02 DIAGNOSIS — G629 Polyneuropathy, unspecified: Secondary | ICD-10-CM | POA: Diagnosis not present

## 2015-12-04 DIAGNOSIS — M791 Myalgia: Secondary | ICD-10-CM | POA: Diagnosis not present

## 2015-12-04 DIAGNOSIS — G43719 Chronic migraine without aura, intractable, without status migrainosus: Secondary | ICD-10-CM | POA: Diagnosis not present

## 2015-12-04 DIAGNOSIS — R51 Headache: Secondary | ICD-10-CM | POA: Diagnosis not present

## 2015-12-04 DIAGNOSIS — M542 Cervicalgia: Secondary | ICD-10-CM | POA: Diagnosis not present

## 2015-12-05 ENCOUNTER — Encounter (HOSPITAL_COMMUNITY): Payer: Self-pay | Admitting: Oncology

## 2015-12-05 ENCOUNTER — Encounter (HOSPITAL_COMMUNITY): Payer: Medicare HMO

## 2015-12-05 ENCOUNTER — Encounter (HOSPITAL_COMMUNITY): Payer: Medicare HMO | Attending: Oncology | Admitting: Oncology

## 2015-12-05 VITALS — BP 114/74 | HR 58 | Temp 98.4°F | Resp 16 | Ht 66.0 in | Wt 138.0 lb

## 2015-12-05 DIAGNOSIS — J449 Chronic obstructive pulmonary disease, unspecified: Secondary | ICD-10-CM | POA: Diagnosis not present

## 2015-12-05 DIAGNOSIS — D509 Iron deficiency anemia, unspecified: Secondary | ICD-10-CM | POA: Diagnosis not present

## 2015-12-05 DIAGNOSIS — D500 Iron deficiency anemia secondary to blood loss (chronic): Secondary | ICD-10-CM | POA: Insufficient documentation

## 2015-12-05 DIAGNOSIS — F1210 Cannabis abuse, uncomplicated: Secondary | ICD-10-CM | POA: Insufficient documentation

## 2015-12-05 DIAGNOSIS — E559 Vitamin D deficiency, unspecified: Secondary | ICD-10-CM | POA: Insufficient documentation

## 2015-12-05 DIAGNOSIS — R109 Unspecified abdominal pain: Secondary | ICD-10-CM | POA: Diagnosis not present

## 2015-12-05 DIAGNOSIS — K3184 Gastroparesis: Secondary | ICD-10-CM | POA: Insufficient documentation

## 2015-12-05 DIAGNOSIS — G8929 Other chronic pain: Secondary | ICD-10-CM | POA: Diagnosis not present

## 2015-12-05 LAB — COMPREHENSIVE METABOLIC PANEL
ALT: 15 U/L (ref 14–54)
ANION GAP: 8 (ref 5–15)
AST: 22 U/L (ref 15–41)
Albumin: 4.9 g/dL (ref 3.5–5.0)
Alkaline Phosphatase: 81 U/L (ref 38–126)
BUN: 15 mg/dL (ref 6–20)
CALCIUM: 10.2 mg/dL (ref 8.9–10.3)
CHLORIDE: 105 mmol/L (ref 101–111)
CO2: 26 mmol/L (ref 22–32)
CREATININE: 0.94 mg/dL (ref 0.44–1.00)
Glucose, Bld: 80 mg/dL (ref 65–99)
Potassium: 4.4 mmol/L (ref 3.5–5.1)
SODIUM: 139 mmol/L (ref 135–145)
Total Bilirubin: 0.3 mg/dL (ref 0.3–1.2)
Total Protein: 8.2 g/dL — ABNORMAL HIGH (ref 6.5–8.1)

## 2015-12-05 LAB — CBC WITH DIFFERENTIAL/PLATELET
BASOS ABS: 0 10*3/uL (ref 0.0–0.1)
BASOS PCT: 0 %
EOS ABS: 0 10*3/uL (ref 0.0–0.7)
Eosinophils Relative: 0 %
HEMATOCRIT: 38 % (ref 36.0–46.0)
HEMOGLOBIN: 12.5 g/dL (ref 12.0–15.0)
Lymphocytes Relative: 18 %
Lymphs Abs: 1.9 10*3/uL (ref 0.7–4.0)
MCH: 30 pg (ref 26.0–34.0)
MCHC: 32.9 g/dL (ref 30.0–36.0)
MCV: 91.3 fL (ref 78.0–100.0)
MONOS PCT: 9 %
Monocytes Absolute: 0.9 10*3/uL (ref 0.1–1.0)
NEUTROS ABS: 7.6 10*3/uL (ref 1.7–7.7)
NEUTROS PCT: 73 %
Platelets: 184 10*3/uL (ref 150–400)
RBC: 4.16 MIL/uL (ref 3.87–5.11)
RDW: 13.3 % (ref 11.5–15.5)
WBC: 10.5 10*3/uL (ref 4.0–10.5)

## 2015-12-05 LAB — IRON AND TIBC
IRON: 71 ug/dL (ref 28–170)
SATURATION RATIOS: 21 % (ref 10.4–31.8)
TIBC: 340 ug/dL (ref 250–450)
UIBC: 269 ug/dL

## 2015-12-05 LAB — FERRITIN: FERRITIN: 107 ng/mL (ref 11–307)

## 2015-12-05 NOTE — Assessment & Plan Note (Addendum)
Anemia, previously on PO iron replacement with ferrous sulfate with improved HGB but with persistent nausea.  PO iron placed on hold in November 2016 with improvement in nausea.  Now on IV iron replacement when indicated.  She underwent EGD on 10/24/2015 by Dr. Gala Romney which demonstrated a normal esophagus, diffuse erythematous mucosa in the gastric body, narrowed pyloric channel.  Examination of bulb and second portion revealed no abnormalities.  The pylorus was injected in 4 quadrants with 25 units of Botox each quadrant.  Labs today: CBC diff, CMET, iron/TIBC, ferritin.  I personally reviewed and went over laboratory results with the patient.  The results are noted within this dictation.  CBC is WNL.  Iron studies are pending at this time.  Labs in 6 months: CBC diff, renal function panel, anemia panel.  Return in 6 months for follow-up.   She can return sooner than planned for lab work, at any time.

## 2015-12-05 NOTE — Patient Instructions (Signed)
[place] at [facility] Discharge Instructions  RECOMMENDATIONS MADE BY THE CONSULTANT AND ANY TEST RESULTS WILL BE SENT TO YOUR REFERRING PHYSICIAN.  You were seen today by Kirby Crigler PA-C. Return in 6 months for follow up and labs.   Thank you for choosing [place] at [facility] to provide your oncology and hematology care.  To afford each patient quality time with our provider, please arrive at least 15 minutes before your scheduled appointment time.   Beginning January 23rd 2017 lab work for the [facility] will be done in the  Main lab at [facility] on 1st floor. If you have a lab appointment with the [facility] please come in thru the  Main Entrance and check in at the main information desk  You need to re-schedule your appointment should you arrive 10 or more minutes late.  We strive to give you quality time with our providers, and arriving late affects you and other patients whose appointments are after yours.  Also, if you no show three or more times for appointments you may be dismissed from the clinic at the providers discretion.     Again, thank you for choosing [facility].  Our hope is that these requests will decrease the amount of time that you wait before being seen by our physicians.       _____________________________________________________________  Should you have questions after your visit to [facility], please contact our office at [phone] between the hours of 8:30 a.m. and 4:30 p.m.  Voicemails left after 4:30 p.m. will not be returned until the following business day.  For prescription refill requests, have your pharmacy contact our office.         Resources For Cancer Patients and their Caregivers ? American Cancer Society: Can assist with transportation, wigs, general needs, runs Look Good Feel Better.        773-221-4605 ? Cancer Care: Provides financial assistance,  online support groups, medication/co-pay assistance.  1-800-813-HOPE 269-436-9658) ? [facility] Assists [place] cancer patients and their families through emotional , educational and financial support.  234-356-3643 ? [place] DSS Where to apply for food stamps, Medicaid and utility assistance. 248-828-4788 ? [facility]: Transportation to medical appointments. (563)600-4293 ? Social Security Administration: May apply for disability if have a Stage IV cancer. (940)342-4485 617-467-4555 ? [employer], Disability and Transit Services: Assists with nutrition, care and transit needs. [place] Support Programs: @10RELATIVEDAYS @ > Cancer Support Group  2nd Tuesday of the month 1pm-2pm, Journey Room  > Creative Journey  3rd Tuesday of the month 1130am-1pm, Journey Room  > Look Good Feel Better  1st Wednesday of the month 10am-12 noon, Journey Room (Call [place] to register [phone])

## 2015-12-05 NOTE — Progress Notes (Signed)
[name], MD [address] [place] [address]  Iron deficiency anemia due to chronic blood loss - Plan: CBC with Differential, Renal function panel, Vitamin B12, Folate, Iron and TIBC, Ferritin, CANCELED: Renal function panel, CANCELED: CBC with Differential, CANCELED: Renal function panel, CANCELED: Iron and TIBC, CANCELED: Ferritin  CURRENT THERAPY: Observation  INTERVAL HISTORY: Tracey Daniel 35 y.o. female returns for followup of intermittent iron deficiency anemia, intolerance to PO ferrous sulfate with N/V, now needing intermittent IV iron replacement when indicated.  She underwent EGD on 10/24/2015 by Dr. Gala Romney which demonstrated a normal esophagus, diffuse erythematous mucosa in the gastric body, narrowed pyloric channel.  Examination of bulb and second portion revealed no abnormalities.  The pylorus was injected in 4 quadrants with 25 units of Botox each quadrant.  She is doing well.  She denies any new complaints.  She denies any blood in her stools.  Review of Systems  Constitutional: Positive for weight loss. Negative for chills and fever.  HENT: Negative.   Eyes: Negative.   Respiratory: Negative.  Negative for cough and shortness of breath.   Cardiovascular: Negative.  Negative for chest pain.  Gastrointestinal: Negative.  Negative for blood in stool and melena.  Genitourinary: Negative.   Musculoskeletal: Negative.   Skin: Negative.   Neurological: Negative.   Endo/Heme/Allergies: Negative.   Psychiatric/Behavioral: Negative.     Past Medical History:  Diagnosis Date  . Anemia of other chronic disease 11/29/2012  . Asthma   . Back pain, chronic   . Chronic abdominal pain   . Constipation   . COPD (chronic obstructive pulmonary disease) ([address])   . Cyst of skin    mid spine area  . Depression 2000   h/o suicidal ideation  . Gastric outlet obstruction   . Gastritis   . Gastroparesis   . Migraine   . Migraines   . Nausea  and vomiting    recurrent  . Nicotine addiction   . Osteoporosis   . Peptic ulcer disease   . Pyloric spasm 03/30/2011  . Seasonal allergies   . Sinusitis   . Substance abuse 2008   marijuana  . Vitamin D deficiency     Past Surgical History:  Procedure Laterality Date  . BALLOON DILATION N/A 02/09/2013   Procedure: BALLOON DILATION;  Surgeon: Missy Sabins, MD;  Location: [facility];  Service: Endoscopy;  Laterality: N/A;  . BOTOX INJECTION N/A 02/09/2013   Procedure: BOTOX INJECTION;  Surgeon: Missy Sabins, MD;  Location: [facility];  Service: Endoscopy;  Laterality: N/A;  possible balloon  . BOTOX INJECTION N/A 10/24/2015   Procedure: BOTOX INJECTION;  Surgeon: Daneil Dolin, MD;  Location: [facility];  Service: Endoscopy;  Laterality: N/A;  . CARPAL TUNNEL RELEASE     left hand  . CHOLECYSTECTOMY     ?2002  . COLONOSCOPY WITH PROPOFOL N/A 09/20/2014   RMR: Normal ileo-colonoscopy  . ESOPHAGEAL DILATION  12/25/2010   Procedure: ESOPHAGEAL DILATION;  Surgeon: Dorothyann Peng, MD;  Location: [facility];  Service: Endoscopy;;  . ESOPHAGOGASTRODUODENOSCOPY  12/25/2010   Dorothyann Peng, MD;  moderate gastritis, ?goo secondary to pylorspasm. BX showed reactive gstropathy no h.pyori, SB mucosa with intramucosal lymphocytosis and partial villous blunting (TTG 4.0 normal)  . ESOPHAGOGASTRODUODENOSCOPY N/A 11/14/2012   [id number] hiatal hernia. Abnormal gastric mucosa of  uncertain significance-status post biopsy. Subjectively, patient may have recurrent symptomatic, pylorospasm.  . ESOPHAGOGASTRODUODENOSCOPY (EGD)  WITH PROPOFOL N/A 02/09/2013   elongated stomach, partial lower esophageal ring widely patent. No obvious pyloric stenosis s/p Botox  . ESOPHAGOGASTRODUODENOSCOPY (EGD) WITH PROPOFOL N/A 10/24/2015   Procedure: ESOPHAGOGASTRODUODENOSCOPY (EGD) WITH PROPOFOL;  Surgeon: Daneil Dolin, MD;  Location: [facility];  Service: Endoscopy;  Laterality: N/A;  830   . laser surgery on  cervix    . TOE SURGERY      Family History  Problem Relation Age of Onset  . Diabetes Father   . Liver disease Father     liver transplant at [facility], age 29  . Lung cancer Mother 20  . Heart disease Maternal Grandmother   . Parkinson's disease Maternal Grandfather   . Multiple sclerosis Sister 34  . Depression Sister 16    bipolar and schizophrenic  . Alcohol abuse Sister   . Colon cancer Neg Hx     Social History   Social History  . Marital status: Single    Spouse name: N/A  . Number of children: 0  . Years of education: N/A   Occupational History  . disability Unemployed   Social History Main Topics  . Smoking status: Current Every Day Smoker    Packs/day: 0.10    Years: 15.00    Types: Cigarettes  . Smokeless tobacco: Never Used     Comment: smokes two  cigarette daily  . Alcohol use No     Comment: none since July 2016. Periodic heavy drinker for months at a time.  . Drug use: No  . Sexual activity: Yes   Other Topics Concern  . Not on file   Social History Narrative  . No narrative on file     PHYSICAL EXAMINATION  ECOG PERFORMANCE STATUS: 0 - Asymptomatic  There were no vitals filed for this visit.  BP 114/74 P 58 R 16 T 98.4 O2 100% on RA  GENERAL:alert, no distress, comfortable, cooperative, smiling and unaccompanied SKIN: skin color, texture, turgor are normal, no rashes or significant lesions HEAD: Normocephalic, No masses, lesions, tenderness or abnormalities EYES: normal, EOMI, Conjunctiva are pink and non-injected EARS: External ears normal OROPHARYNX:lips, buccal mucosa, and tongue normal and mucous membranes are moist  NECK: supple, no adenopathy, trachea midline LYMPH:  no palpable lymphadenopathy BREAST:not examined LUNGS: clear to auscultation and percussion HEART: regular rate & rhythm, no murmurs, no gallops, S1 normal and S2 normal ABDOMEN:abdomen soft, non-tender and normal bowel sounds BACK: Back symmetric, no curvature.,  No CVA tenderness EXTREMITIES:less then 2 second capillary refill, no joint deformities, effusion, or inflammation, no skin discoloration, no cyanosis  NEURO: alert & oriented x 3 with fluent speech, no focal motor/sensory deficits, gait normal   LABORATORY DATA: CBC    Component Value Date/Time   WBC 10.5 12/05/2015 0818   RBC 4.16 12/05/2015 0818   HGB 12.5 12/05/2015 0818   HCT 38.0 12/05/2015 0818   PLT 184 12/05/2015 0818   MCV 91.3 12/05/2015 0818   MCH 30.0 12/05/2015 0818   MCHC 32.9 12/05/2015 0818   RDW 13.3 12/05/2015 0818   LYMPHSABS 1.9 12/05/2015 0818   MONOABS 0.9 12/05/2015 0818   EOSABS 0.0 12/05/2015 0818   BASOSABS 0.0 12/05/2015 0818      Chemistry      Component Value Date/Time   NA 139 12/05/2015 0818   K 4.4 12/05/2015 0818   CL 105 12/05/2015 0818   CO2 26 12/05/2015 0818   BUN 15 12/05/2015 0818   CREATININE 0.94 12/05/2015 0818  CREATININE 0.72 05/02/2014 0733      Component Value Date/Time   CALCIUM 10.2 12/05/2015 0818   ALKPHOS 81 12/05/2015 0818   AST 22 12/05/2015 0818   ALT 15 12/05/2015 0818   BILITOT 0.3 12/05/2015 0818     Lab Results  Component Value Date   IRON 58 02/13/2015   TIBC 294 02/13/2015   FERRITIN 149 05/16/2015     PENDING LABS:   RADIOGRAPHIC STUDIES:  No results found.   PATHOLOGY:    ASSESSMENT AND PLAN:  Anemia Anemia, previously on PO iron replacement with ferrous sulfate with improved HGB but with persistent nausea.  PO iron placed on hold in November 2016 with improvement in nausea.  Now on IV iron replacement when indicated.  She underwent EGD on 10/24/2015 by Dr. Gala Romney which demonstrated a normal esophagus, diffuse erythematous mucosa in the gastric body, narrowed pyloric channel.  Examination of bulb and second portion revealed no abnormalities.  The pylorus was injected in 4 quadrants with 25 units of Botox each quadrant.  Labs today: CBC diff, CMET, iron/TIBC, ferritin.  I personally  reviewed and went over laboratory results with the patient.  The results are noted within this dictation.  CBC is WNL.  Iron studies are pending at this time.  Labs in 6 months: CBC diff, renal function panel, anemia panel.  Return in 6 months for follow-up.   She can return sooner than planned for lab work, at any time.   ORDERS PLACED FOR THIS ENCOUNTER: Orders Placed This Encounter  Procedures  . CBC with Differential  . Renal function panel  . Vitamin B12  . Folate  . Iron and TIBC  . Ferritin    MEDICATIONS PRESCRIBED THIS ENCOUNTER: No orders of the defined types were placed in this encounter.   THERAPY PLAN:  Continue to monitor labs, including iron studies, and provide IV iron support when indicated.  All questions were answered. The patient knows to call the clinic with any problems, questions or concerns. We can certainly see the patient much sooner if necessary.  Patient and plan discussed with Dr. Ancil Linsey and she is in agreement with the aforementioned.   This note is electronically signed by: Doy Mince 12/05/2015 9:42 AM

## 2015-12-09 DIAGNOSIS — G629 Polyneuropathy, unspecified: Secondary | ICD-10-CM | POA: Diagnosis not present

## 2015-12-18 DIAGNOSIS — M791 Myalgia: Secondary | ICD-10-CM | POA: Diagnosis not present

## 2015-12-18 DIAGNOSIS — M542 Cervicalgia: Secondary | ICD-10-CM | POA: Diagnosis not present

## 2015-12-18 DIAGNOSIS — R51 Headache: Secondary | ICD-10-CM | POA: Diagnosis not present

## 2015-12-18 DIAGNOSIS — G43719 Chronic migraine without aura, intractable, without status migrainosus: Secondary | ICD-10-CM | POA: Diagnosis not present

## 2015-12-24 DIAGNOSIS — G43719 Chronic migraine without aura, intractable, without status migrainosus: Secondary | ICD-10-CM | POA: Diagnosis not present

## 2015-12-31 ENCOUNTER — Ambulatory Visit (INDEPENDENT_AMBULATORY_CARE_PROVIDER_SITE_OTHER): Payer: Medicare HMO | Admitting: Nurse Practitioner

## 2015-12-31 ENCOUNTER — Ambulatory Visit: Payer: Commercial Managed Care - HMO | Admitting: Gastroenterology

## 2015-12-31 ENCOUNTER — Encounter: Payer: Self-pay | Admitting: Nurse Practitioner

## 2015-12-31 VITALS — BP 126/58 | HR 64 | Temp 98.4°F | Ht 66.0 in | Wt 135.4 lb

## 2015-12-31 DIAGNOSIS — K5909 Other constipation: Secondary | ICD-10-CM | POA: Diagnosis not present

## 2015-12-31 DIAGNOSIS — K313 Pylorospasm, not elsewhere classified: Secondary | ICD-10-CM

## 2015-12-31 DIAGNOSIS — K219 Gastro-esophageal reflux disease without esophagitis: Secondary | ICD-10-CM

## 2015-12-31 DIAGNOSIS — K3184 Gastroparesis: Secondary | ICD-10-CM | POA: Diagnosis not present

## 2015-12-31 MED ORDER — ONDANSETRON 4 MG PO TBDP: 4.0000 mg | ORAL_TABLET | Freq: Three times a day (TID) | ORAL | 1 refills | Status: DC | PRN

## 2015-12-31 MED ORDER — PANTOPRAZOLE SODIUM 40 MG PO TBEC: 40.0000 mg | DELAYED_RELEASE_TABLET | Freq: Two times a day (BID) | ORAL | 3 refills | Status: DC

## 2015-12-31 MED ORDER — SUCRALFATE 1 G PO TABS: ORAL_TABLET | ORAL | 3 refills | Status: DC

## 2015-12-31 NOTE — Patient Instructions (Signed)
1. I have refilled your medications for you 2. Call the provider who prescribed the migraine medication for you to discuss the decreased appetite side effect 3. Return for follow-up in 6 months 4. Call if you have questions or problems before then.

## 2015-12-31 NOTE — Progress Notes (Signed)
Referring Provider: [name], MD Primary Care Physician:  [name], MD Primary GI:  Dr. Gala Romney  Chief Complaint  Patient presents with  . Abdominal Pain    HPI:   Tracey Daniel is a 35 y.o. female who presents for follow-up on abdominal pain and pylorospasm. The patient was last seen in our office 10/15/2015 at which point it was noted she has a long-standing history of known pylorospasm producing partial functional gastric outlet obstruction. Started on Reglan at last office visit and scheduled to do off label Botox injections of pyloric channel which is been successfully performed by Dr. Amedeo Plenty and Derrill Kay in [place] and [place]. At that time she noted worsening abdominal discomfort and back pain possibly related to worsening constipation with holding of Linzess. She was arranged for upper endoscopy with off label Botox injections. Recommended resuming Linzess 290 daily, continue Protonix 40 daily, continue Reglan. Recheck CBC and consider cross-sectional imaging if remains elevated. CBC completed 10/15/2015 found normalized white blood cell count at 8.6.  EGD completed 10/24/2015 with Botox injection. Findings included normal appearing esophageal mucosa, some retained gastric contents, small hiatal hernia, diffuse erythematous mucosa throughout the gastric body, narrowed pyloric channel requiring pressure application to admit the scope, normal duodenum. The pylorus was injected in 4 quadrants with 25 units of Botox each quadrant. Recommended resuming gastroparesis diet, continue medications, follow-up in 8 weeks.  Today she states she is doing better after EGD and Botox. However, now with poor appetite on Zonisamide (for migraines) which was started about 1.5 months ago. Poor appetite started around the time of starting this new medication. States she has lost weight subjectively. Objectively, she has lost about 8 pounds in 2.5 months. GERD is somewhat well-managed,  needs an Rx refill for Carafate. Dysphagia is improved. Nausea has calmed down but needs refill on antiemetics. Denies hematochezia, melena, fever. Constipation is well-managed and she titrates her Linzess to achieve good effect. Denies any other upper or lower GI symptoms.  Past Medical History:  Diagnosis Date  . Anemia of other chronic disease 11/29/2012  . Asthma   . Back pain, chronic   . Chronic abdominal pain   . Constipation   . COPD (chronic obstructive pulmonary disease) ([place])   . Cyst of skin    mid spine area  . Depression 2000   h/o suicidal ideation  . Gastric outlet obstruction   . Gastritis   . Gastroparesis   . Migraine   . Migraines   . Nausea and vomiting    recurrent  . Nicotine addiction   . Osteoporosis   . Peptic ulcer disease   . Pyloric spasm 03/30/2011  . Seasonal allergies   . Sinusitis   . Substance abuse 2008   marijuana  . Vitamin D deficiency     Past Surgical History:  Procedure Laterality Date  . BALLOON DILATION N/A 02/09/2013   Procedure: BALLOON DILATION;  Surgeon: Missy Sabins, MD;  Location: [facility];  Service: Endoscopy;  Laterality: N/A;  . BOTOX INJECTION N/A 02/09/2013   Procedure: BOTOX INJECTION;  Surgeon: Missy Sabins, MD;  Location: [facility];  Service: Endoscopy;  Laterality: N/A;  possible balloon  . BOTOX INJECTION N/A 10/24/2015   Procedure: BOTOX INJECTION;  Surgeon: Daneil Dolin, MD;  Location: [facility];  Service: Endoscopy;  Laterality: N/A;  . CARPAL TUNNEL RELEASE     left hand  . CHOLECYSTECTOMY     ?2002  . COLONOSCOPY WITH PROPOFOL  N/A 09/20/2014   RMR: Normal ileo-colonoscopy  . ESOPHAGEAL DILATION  12/25/2010   Procedure: ESOPHAGEAL DILATION;  Surgeon: Dorothyann Peng, MD;  Location: [facility];  Service: Endoscopy;;  . ESOPHAGOGASTRODUODENOSCOPY  12/25/2010   Dorothyann Peng, MD;  moderate gastritis, ?goo secondary to pylorspasm. BX showed reactive gstropathy no h.pyori, SB mucosa with intramucosal  lymphocytosis and partial villous blunting (TTG 4.0 normal)  . ESOPHAGOGASTRODUODENOSCOPY N/A 11/14/2012   [id number] hiatal hernia. Abnormal gastric mucosa of  uncertain significance-status post biopsy. Subjectively, patient may have recurrent symptomatic, pylorospasm.  . ESOPHAGOGASTRODUODENOSCOPY (EGD) WITH PROPOFOL N/A 02/09/2013   elongated stomach, partial lower esophageal ring widely patent. No obvious pyloric stenosis s/p Botox  . ESOPHAGOGASTRODUODENOSCOPY (EGD) WITH PROPOFOL N/A 10/24/2015   Procedure: ESOPHAGOGASTRODUODENOSCOPY (EGD) WITH PROPOFOL;  Surgeon: Daneil Dolin, MD;  Location: [facility];  Service: Endoscopy;  Laterality: N/A;  830   . laser surgery on cervix    . TOE SURGERY      Current Outpatient Prescriptions  Medication Sig Dispense Refill  . albuterol (PROVENTIL HFA;VENTOLIN HFA) 108 (90 Base) MCG/ACT inhaler Inhale 2 puffs into the lungs every 6 (six) hours as needed. Shortness of breath 6.7 g 4  . BIOTIN PO Take 1 tablet by mouth daily.    . budesonide-formoterol (SYMBICORT) 80-4.5 MCG/ACT inhaler Inhale 2 puffs into the lungs 2 (two) times daily. 1 Inhaler 4  . calcium-vitamin D (OSCAL WITH D) 500-200 MG-UNIT per tablet Take 1 tablet by mouth 2 (two) times daily.    . clobetasol cream (TEMOVATE) [id number] % Apply 1 application topically 2 (two) times daily as needed (rash). Apply to rash    . dicyclomine (BENTYL) 20 MG tablet Take 1 tablet (20 mg total) by mouth 2 (two) times daily. 20 tablet 0  . DULoxetine (CYMBALTA) 30 MG capsule Take 1 capsule (30 mg total) by mouth daily. 30 capsule 3  . ergocalciferol (VITAMIN D2) 50000 UNITS capsule Take 1 capsule (50,000 Units total) by mouth once a week. One capsule once weekly (Patient taking differently: Take 50,000 Units by mouth every Friday. One capsule once weekly) 12 capsule 1  . estradiol (ESTRACE VAGINAL) 0.1 MG/GM vaginal cream Place 2 g vaginally at bedtime. As directed    . estradiol (ESTRACE) 1 MG tablet Take 1  tablet (1 mg total) by mouth daily. 30 tablet 11  . feeding supplement (ENSURE CLINICAL STRENGTH) LIQD Take 237 mLs by mouth 3 (three) times daily with meals. (Patient taking differently: Take 237 mLs by mouth 2 (two) times daily. ) 10 Bottle 3  . fluticasone (FLONASE) 50 MCG/ACT nasal spray Place 2 sprays into both nostrils daily. 16 g 6  . gabapentin (NEURONTIN) 100 MG capsule Take 100 mg by mouth 3 (three) times daily.    . Linaclotide (LINZESS) 290 MCG CAPS capsule Take 1 capsule (290 mcg total) by mouth daily. 30 capsule 5  . loratadine (CLARITIN) 10 MG tablet Take 1 tablet (10 mg total) by mouth daily. 30 tablet 5  . metoCLOPramide (REGLAN) 10 MG tablet Take 1 tablet (10 mg total) by mouth every 6 (six) hours. (Patient taking differently: Take 10 mg by mouth 2 (two) times daily. ) 30 tablet 1  . NUCYNTA 50 MG TABS tablet Take 50 mg by mouth every 8 (eight) hours as needed for moderate pain. Reported on 05/08/2015  0  . ondansetron (ZOFRAN ODT) 4 MG disintegrating tablet Take 1 tablet (4 mg total) by mouth every 8 (eight) hours as needed for  nausea. (Patient not taking: Reported on 01/01/2016) 30 tablet 1  . pantoprazole (PROTONIX) 40 MG tablet Take 1 tablet (40 mg total) by mouth 2 (two) times daily before a meal. 180 tablet 3  . potassium chloride SA (K-DUR,KLOR-CON) 20 MEQ tablet Take 1 tablet (20 mEq total) by mouth daily. 30 tablet 0  . pravastatin (PRAVACHOL) 20 MG tablet Take 1 tablet (20 mg total) by mouth daily. 30 tablet 4  . progesterone (PROMETRIUM) 200 MG capsule TAKE 1 CAPSULE BY MOUTH EVERY DAY. EVERY THIRD MONTH, JANUARY, APRIL, JULY, AND OCTOBER FOR 2 WEEKS (Patient taking differently: TAKES 1 CAP DAILY FOR 2 WEEKS EVERY THIRD MONTH, [place], APRIL, [place], AND October) 14 capsule 3  . sucralfate (CARAFATE) 1 g tablet Crush one tablet and mix in applesauce, take every morning and at bedtime as needed for stomach burning. 60 tablet 3  . tiZANidine (ZANAFLEX) 4 MG tablet Take 4 mg by  mouth 3 (three) times daily as needed for muscle spasms.    Marland Kitchen zonisamide (ZONEGRAN) 25 MG capsule 100 mg daily.     Marland Kitchen azelastine (ASTELIN) 0.1 % nasal spray Place 2 sprays into both nostrils 2 (two) times daily. Use in each nostril as directed 30 mL 12   No current facility-administered medications for this visit.     Allergies as of 12/31/2015 - Review Complete 12/31/2015  Allergen Reaction Noted  . Benadryl [diphenhydramine] Anaphylaxis 03/25/2014  . Latex Itching and Other (See Comments) 11/30/2011  . Penicillins Other (See Comments)     Family History  Problem Relation Age of Onset  . Diabetes Father   . Liver disease Father     liver transplant at [facility], age 108  . Lung cancer Mother 65  . Heart disease Maternal Grandmother   . Parkinson's disease Maternal Grandfather   . Multiple sclerosis Sister 67  . Depression Sister 16    bipolar and schizophrenic  . Alcohol abuse Sister   . Colon cancer Neg Hx     Social History   Social History  . Marital status: Single    Spouse name: N/A  . Number of children: 0  . Years of education: N/A   Occupational History  . disability Unemployed   Social History Main Topics  . Smoking status: Current Every Day Smoker    Packs/day: 0.10    Years: 15.00    Types: Cigarettes  . Smokeless tobacco: Never Used     Comment: smokes two  cigarette daily  . Alcohol use No     Comment: none since July 2016. Periodic heavy drinker for months at a time.  . Drug use: No  . Sexual activity: Yes   Other Topics Concern  . None   Social History Narrative  . None    Review of Systems: General: Negative for anorexia, fever, chills, fatigue, weakness. ENT: Negative for hoarseness, difficulty swallowing. CV: Negative for chest pain, angina, palpitations, peripheral edema.  Respiratory: Negative for dyspnea at rest, cough, sputum, wheezing.  GI: See history of present illness. Endo: Negative for unusual weight change.  Heme: Negative for  bruising or bleeding.   Physical Exam: BP (!) 126/58   Pulse 64   Temp 98.4 F (36.9 C) (Oral)   Ht 5\' 6"  (1.676 m)   Wt 135 lb 6.4 oz (61.4 kg)   BMI 21.85 kg/m  General:   Alert and oriented. Pleasant and cooperative. Well-nourished and well-developed.  Head:  Normocephalic and atraumatic. Eyes:  Without icterus, sclera clear and  conjunctiva pink.  Ears:  Normal auditory acuity. Cardiovascular:  S1, S2 present without murmurs appreciated. Extremities without clubbing or edema. Respiratory:  Clear to auscultation bilaterally. No wheezes, rales, or rhonchi. No distress.  Gastrointestinal:  +BS, soft, non-tender and non-distended. No HSM noted. No guarding or rebound. No masses appreciated.  Rectal:  Deferred  Musculoskalatal:  Symmetrical without gross deformities. Neurologic:  Alert and oriented x4;  grossly normal neurologically. Psych:  Alert and cooperative. Normal mood and affect. Heme/Lymph/Immune: No excessive bruising noted.    01/03/2016 4:04 PM   Disclaimer: This note was dictated with voice recognition software. Similar sounding words can inadvertently be transcribed and may not be corrected upon review.

## 2016-01-01 ENCOUNTER — Encounter: Payer: Self-pay | Admitting: Family Medicine

## 2016-01-01 ENCOUNTER — Ambulatory Visit (INDEPENDENT_AMBULATORY_CARE_PROVIDER_SITE_OTHER): Payer: Medicare HMO | Admitting: Family Medicine

## 2016-01-01 VITALS — BP 112/74 | HR 60 | Resp 16 | Ht 66.0 in | Wt 137.0 lb

## 2016-01-01 DIAGNOSIS — F17218 Nicotine dependence, cigarettes, with other nicotine-induced disorders: Secondary | ICD-10-CM

## 2016-01-01 DIAGNOSIS — G44009 Cluster headache syndrome, unspecified, not intractable: Secondary | ICD-10-CM

## 2016-01-01 DIAGNOSIS — J302 Other seasonal allergic rhinitis: Secondary | ICD-10-CM

## 2016-01-01 DIAGNOSIS — E784 Other hyperlipidemia: Secondary | ICD-10-CM

## 2016-01-01 DIAGNOSIS — E7849 Other hyperlipidemia: Secondary | ICD-10-CM

## 2016-01-01 MED ORDER — AZELASTINE HCL 0.1 % NA SOLN: 2.0000 | Freq: Two times a day (BID) | NASAL | 12 refills | Status: DC

## 2016-01-01 NOTE — Assessment & Plan Note (Signed)

## 2016-01-01 NOTE — Assessment & Plan Note (Signed)
Hyperlipidemia:Low fat diet discussed and encouraged.   Lipid Panel  Lab Results  Component Value Date   CHOL 215 (H) 09/27/2015   HDL 42 (L) 09/27/2015   LDLCALC 147 (H) 09/27/2015   TRIG 128 09/27/2015   CHOLHDL 5.1 (H) 09/27/2015   Updated lab needed at/ before next visit.

## 2016-01-01 NOTE — Assessment & Plan Note (Addendum)
unControlled, add astelin

## 2016-01-01 NOTE — Progress Notes (Signed)
   Tracey Daniel     MRN: [id number]      DOB: 1980-09-26   HPI Tracey Daniel is here for follow up and re-evaluation of chronic medical conditions, medication management and review of any available recent lab and radiology data.  Preventive health is updated, specifically  Cancer screening and Immunization.   Questions or concerns regarding consultations or procedures which the PT has had in the interim are  addressed. The PT denies any adverse reactions to current medications since the last visit.  C/o uncontrolled allergy symptoms with watery runny nose and sneezing   ROS Denies recent fever or chills. Denies sinus pressure, nasal congestion, ear pain or sore throat. Denies chest congestion, productive cough or wheezing. Denies chest pains, palpitations and leg swelling Denies abdominal pain, nausea, vomiting,diarrhea or constipation.   Denies dysuria, frequency, hesitancy or incontinence. Denies joint pain, swelling and limitation in mobility. Denies headaches, seizures, numbness, or tingling. Denies depression, anxiety or insomnia. Denies skin break down or rash.   PE  BP 112/74   Pulse 60   Resp 16   Ht 5\' 6"  (1.676 m)   Wt 137 lb (62.1 kg)   SpO2 100%   BMI 22.11 kg/m   Patient alert and oriented and in no cardiopulmonary distress.  HEENT: No facial asymmetry, EOMI,   oropharynx pink and moist.  Neck supple no JVD, no mass.Nasal mucosa erythematous and edematous  Chest: Clear to auscultation bilaterally.  CVS: S1, S2 no murmurs, no S3.Regular rate.  ABD: Soft non tender.   Ext: No edema  MS: Adequate ROM spine, shoulders, hips and knees.  Skin: Intact, no ulcerations or rash noted.  Psych: Good eye contact, normal affect. Memory intact not anxious or depressed appearing.  CNS: CN 2-12 intact, power,  normal throughout.no focal deficits noted.   Assessment & Plan  Hyperlipemia Hyperlipidemia:Low fat diet discussed and encouraged.   Lipid Panel    Lab Results  Component Value Date   CHOL 215 (H) 09/27/2015   HDL 42 (L) 09/27/2015   LDLCALC 147 (H) 09/27/2015   TRIG 128 09/27/2015   CHOLHDL 5.1 (H) 09/27/2015   Updated lab needed at/ before next visit.    Nicotine dependence Patient counseled for approximately 5 minutes regarding the health risks of ongoing nicotine use, specifically all types of cancer, heart disease, stroke and respiratory failure. The options available for help with cessation ,the behavioral changes to assist the process, and the option to either gradully reduce usage  Or abruptly stop.is also discussed. Pt is also encouraged to set specific goals in number of cigarettes used daily, as well as to set a quit date.     Seasonal allergies unControlled, add astelin  Headache Improved with botox, being treated at headache center

## 2016-01-01 NOTE — Assessment & Plan Note (Signed)
Improved with botox, being treated at headache center

## 2016-01-01 NOTE — Patient Instructions (Signed)
Annual wellness Feb 8 or after, MD f/u in 5 months  Call [facility] for help, today at 8:30 am is your QUIT day and date  Astelin sent for help with allergies  Work on stopping drug use , very important  It is important that you exercise regularly at least 30 minutes 5 times a week. If you develop chest pain, have severe difficulty breathing, or feel very tired, stop exercising immediately and seek medical attention

## 2016-01-03 ENCOUNTER — Other Ambulatory Visit: Payer: Self-pay | Admitting: Gastroenterology

## 2016-01-03 NOTE — Assessment & Plan Note (Signed)
Constipation is well managed on Linzess. She is become quite adept at titrating her regimen as needed for good effect without adverse effect of diarrhea. Continue to monitor, continue Linzess. Return for follow-up in 6 months.

## 2016-01-03 NOTE — Assessment & Plan Note (Signed)
Patient is clinically improved after EGD with Botox. Continue to monitor, call if problems or questions. Return for follow-up in 6 months or as needed.

## 2016-01-03 NOTE — Assessment & Plan Note (Signed)
Patient with gastroparesis, I have refilled her Zofran today. She is asking about appetite stimulants as her new migraine medication causes poor appetite. However, given it is likely an adverse drug effect from migraine medication I have referred her back to the prescriber to further evaluate for possible adverse drug effect and regimen change or treatment is needed. Return for follow-up in 6 months, call if problems or questions.

## 2016-01-03 NOTE — Assessment & Plan Note (Signed)
GERD essentially well managed. She is requesting, and I am providing, refills of Protonix, Carafate. Return for follow-up in 6 months or sooner if needed.

## 2016-01-06 NOTE — Progress Notes (Signed)
cc'ed to pcp °

## 2016-01-09 DIAGNOSIS — R51 Headache: Secondary | ICD-10-CM | POA: Diagnosis not present

## 2016-01-09 DIAGNOSIS — M542 Cervicalgia: Secondary | ICD-10-CM | POA: Diagnosis not present

## 2016-01-09 DIAGNOSIS — G43719 Chronic migraine without aura, intractable, without status migrainosus: Secondary | ICD-10-CM | POA: Diagnosis not present

## 2016-01-09 DIAGNOSIS — M791 Myalgia: Secondary | ICD-10-CM | POA: Diagnosis not present

## 2016-01-21 DIAGNOSIS — G43719 Chronic migraine without aura, intractable, without status migrainosus: Secondary | ICD-10-CM | POA: Diagnosis not present

## 2016-01-30 DIAGNOSIS — M542 Cervicalgia: Secondary | ICD-10-CM | POA: Diagnosis not present

## 2016-01-30 DIAGNOSIS — M791 Myalgia: Secondary | ICD-10-CM | POA: Diagnosis not present

## 2016-01-30 DIAGNOSIS — G43719 Chronic migraine without aura, intractable, without status migrainosus: Secondary | ICD-10-CM | POA: Diagnosis not present

## 2016-01-30 DIAGNOSIS — R51 Headache: Secondary | ICD-10-CM | POA: Diagnosis not present

## 2016-02-03 ENCOUNTER — Other Ambulatory Visit (HOSPITAL_COMMUNITY): Payer: Self-pay | Admitting: *Deleted

## 2016-02-03 DIAGNOSIS — E876 Hypokalemia: Secondary | ICD-10-CM

## 2016-02-03 MED ORDER — POTASSIUM CHLORIDE CRYS ER 20 MEQ PO TBCR: 20.0000 meq | EXTENDED_RELEASE_TABLET | Freq: Every day | ORAL | 0 refills | Status: DC

## 2016-02-10 ENCOUNTER — Ambulatory Visit (INDEPENDENT_AMBULATORY_CARE_PROVIDER_SITE_OTHER): Payer: Medicare HMO | Admitting: Obstetrics and Gynecology

## 2016-02-10 ENCOUNTER — Encounter: Payer: Self-pay | Admitting: Obstetrics and Gynecology

## 2016-02-10 VITALS — BP 114/78 | HR 56 | Wt 145.8 lb

## 2016-02-10 DIAGNOSIS — N61 Mastitis without abscess: Secondary | ICD-10-CM

## 2016-02-10 DIAGNOSIS — N952 Postmenopausal atrophic vaginitis: Secondary | ICD-10-CM | POA: Diagnosis not present

## 2016-02-10 MED ORDER — DOXYCYCLINE HYCLATE 100 MG PO CAPS: 100.0000 mg | ORAL_CAPSULE | Freq: Two times a day (BID) | ORAL | 0 refills | Status: DC

## 2016-02-10 MED ORDER — ESTRADIOL 0.1 MG/GM VA CREA: 0.2500 | TOPICAL_CREAM | VAGINAL | 4 refills | Status: DC

## 2016-02-10 NOTE — Progress Notes (Signed)
Patient ID: Tracey Daniel, female   DOB: 03/12/1980, 36 y.o.   MRN: [id number]    [facility] Visit  @DATE @            Patient name: Tracey Daniel MRN [id number]  Date of birth: 09/24/1980  CC & HPI:   Chief Complaint  Patient presents with  . right nipple pain    "hard spot" x1 week     Tracey Daniel is a 36 y.o. female presenting today for moderate right nipple pain and swelling x 1 week. Pt notes that the nipple has not changed in shape, but feels harder than the other nipple. She states her pain is worsened with touch. No alleviating factors noted. She denies fever, chills, nipple discharge, redness.   She is also requesting refill of qd Estrace vaginal cream for atrophic vaginitis.   ROS:  ROS +right nipple pain  -nipple discharge   Pertinent History Reviewed:   Reviewed: Significant for POF, atrophic vaginitis  Medical         Past Medical History:  Diagnosis Date  . Anemia of other chronic disease 11/29/2012  . Asthma   . Back pain, chronic   . Chronic abdominal pain   . Constipation   . COPD (chronic obstructive pulmonary disease) ([place])   . Cyst of skin    mid spine area  . Depression 2000   h/o suicidal ideation  . Gastric outlet obstruction   . Gastritis   . Gastroparesis   . Migraine   . Migraines   . Nausea and vomiting    recurrent  . Nicotine addiction   . Osteoporosis   . Peptic ulcer disease   . Pyloric spasm 03/30/2011  . Seasonal allergies   . Sinusitis   . Substance abuse 2008   marijuana  . Vitamin D deficiency                               Surgical Hx:    Past Surgical History:  Procedure Laterality Date  . BALLOON DILATION N/A 02/09/2013   Procedure: BALLOON DILATION;  Surgeon: Missy Sabins, MD;  Location: [facility];  Service: Endoscopy;  Laterality: N/A;  . BOTOX INJECTION N/A 02/09/2013   Procedure: BOTOX INJECTION;  Surgeon: Missy Sabins, MD;  Location: [facility];  Service: Endoscopy;  Laterality: N/A;   possible balloon  . BOTOX INJECTION N/A 10/24/2015   Procedure: BOTOX INJECTION;  Surgeon: Daneil Dolin, MD;  [facility];  Service: Endoscopy;  Laterality: N/A;  . CARPAL TUNNEL RELEASE     left hand  . CHOLECYSTECTOMY     ?2002  . COLONOSCOPY WITH PROPOFOL N/A 09/20/2014   RMR: Normal ileo-colonoscopy  . ESOPHAGEAL DILATION  12/25/2010   Procedure: ESOPHAGEAL DILATION;  Surgeon: Dorothyann Peng, MD;  [facility];  Service: Endoscopy;;  . ESOPHAGOGASTRODUODENOSCOPY  12/25/2010   Dorothyann Peng, MD;  moderate gastritis, ?goo secondary to pylorspasm. BX showed reactive gstropathy no h.pyori, SB mucosa with intramucosal lymphocytosis and partial villous blunting (TTG 4.0 normal)  . ESOPHAGOGASTRODUODENOSCOPY N/A 11/14/2012   [id number] hiatal hernia. Abnormal gastric mucosa of  uncertain significance-status post biopsy. Subjectively, patient may have recurrent symptomatic, pylorospasm.  . ESOPHAGOGASTRODUODENOSCOPY (EGD) WITH PROPOFOL N/A 02/09/2013   elongated stomach, partial lower esophageal ring widely patent. No obvious pyloric stenosis s/p Botox  . ESOPHAGOGASTRODUODENOSCOPY (EGD) WITH PROPOFOL N/A 10/24/2015   Procedure: ESOPHAGOGASTRODUODENOSCOPY (  EGD) WITH PROPOFOL;  Surgeon: Daneil Dolin, MD;  [facility];  Service: Endoscopy;  Laterality: N/A;  830   . laser surgery on cervix    . TOE SURGERY     Medications: Reviewed & Updated - see associated section                       Current Outpatient Prescriptions:  .  albuterol (PROVENTIL HFA;VENTOLIN HFA) 108 (90 Base) MCG/ACT inhaler, Inhale 2 puffs into the lungs every 6 (six) hours as needed. Shortness of breath, Disp: 6.7 g, Rfl: 4 .  azelastine (ASTELIN) 0.1 % nasal spray, Place 2 sprays into both nostrils 2 (two) times daily. Use in each nostril as directed, Disp: 30 mL, Rfl: 12 .  BIOTIN PO, Take 1 tablet by mouth daily., Disp: , Rfl:  .  budesonide-formoterol (SYMBICORT) 80-4.5 MCG/ACT inhaler,  Inhale 2 puffs into the lungs 2 (two) times daily., Disp: 1 Inhaler, Rfl: 4 .  calcium-vitamin D (OSCAL WITH D) 500-200 MG-UNIT per tablet, Take 1 tablet by mouth 2 (two) times daily., Disp: , Rfl:  .  clobetasol cream (TEMOVATE) [id number] %, Apply 1 application topically 2 (two) times daily as needed (rash). Apply to rash, Disp: , Rfl:  .  DULoxetine (CYMBALTA) 30 MG capsule, Take 1 capsule (30 mg total) by mouth daily., Disp: 30 capsule, Rfl: 3 .  ergocalciferol (VITAMIN D2) 50000 UNITS capsule, Take 1 capsule (50,000 Units total) by mouth once a week. One capsule once weekly (Patient taking differently: Take 50,000 Units by mouth every Friday. One capsule once weekly), Disp: 12 capsule, Rfl: 1 .  estradiol (ESTRACE VAGINAL) 0.1 MG/GM vaginal cream, Place 2 g vaginally at bedtime. As directed, Disp: , Rfl:  .  estradiol (ESTRACE) 1 MG tablet, Take 1 tablet (1 mg total) by mouth daily., Disp: 30 tablet, Rfl: 11 .  feeding supplement (ENSURE CLINICAL STRENGTH) LIQD, Take 237 mLs by mouth 3 (three) times daily with meals. (Patient taking differently: Take 237 mLs by mouth 2 (two) times daily. ), Disp: 10 Bottle, Rfl: 3 .  fluticasone (FLONASE) 50 MCG/ACT nasal spray, Place 2 sprays into both nostrils daily., Disp: 16 g, Rfl: 6 .  gabapentin (NEURONTIN) 100 MG capsule, Take 100 mg by mouth 3 (three) times daily., Disp: , Rfl:  .  LINZESS 290 MCG CAPS capsule, TAKE 1 CAPSULE(290 MCG) BY MOUTH DAILY, Disp: 30 capsule, Rfl: 3 .  loratadine (CLARITIN) 10 MG tablet, Take 1 tablet (10 mg total) by mouth daily., Disp: 30 tablet, Rfl: 5 .  metoCLOPramide (REGLAN) 10 MG tablet, Take 1 tablet (10 mg total) by mouth every 6 (six) hours. (Patient taking differently: Take 10 mg by mouth 2 (two) times daily. ), Disp: 30 tablet, Rfl: 1 .  ondansetron (ZOFRAN ODT) 4 MG disintegrating tablet, Take 1 tablet (4 mg total) by mouth every 8 (eight) hours as needed for nausea., Disp: 30 tablet, Rfl: 1 .  pantoprazole (PROTONIX) 40  MG tablet, Take 1 tablet (40 mg total) by mouth 2 (two) times daily before a meal., Disp: 180 tablet, Rfl: 3 .  potassium chloride SA (K-DUR,KLOR-CON) 20 MEQ tablet, Take 1 tablet (20 mEq total) by mouth daily., Disp: 30 tablet, Rfl: 0 .  pravastatin (PRAVACHOL) 20 MG tablet, Take 1 tablet (20 mg total) by mouth daily., Disp: 30 tablet, Rfl: 4 .  progesterone (PROMETRIUM) 200 MG capsule, TAKE 1 CAPSULE BY MOUTH EVERY DAY. EVERY THIRD MONTH, [place], [place], [place],  AND OCTOBER FOR 2 WEEKS (Patient taking differently: TAKES 1 CAP DAILY FOR 2 WEEKS EVERY THIRD MONTH, [place], APRIL, [place], AND October), Disp: 14 capsule, Rfl: 3 .  sucralfate (CARAFATE) 1 g tablet, Crush one tablet and mix in applesauce, take every morning and at bedtime as needed for stomach burning., Disp: 60 tablet, Rfl: 3 .  tiZANidine (ZANAFLEX) 4 MG tablet, Take 4 mg by mouth 3 (three) times daily as needed for muscle spasms., Disp: , Rfl:  .  zonisamide (ZONEGRAN) 25 MG capsule, 100 mg daily. , Disp: , Rfl:  .  dicyclomine (BENTYL) 20 MG tablet, Take 1 tablet (20 mg total) by mouth 2 (two) times daily. (Patient not taking: Reported on 02/10/2016), Disp: 20 tablet, Rfl: 0 .  NUCYNTA 50 MG TABS tablet, Take 50 mg by mouth every 8 (eight) hours as needed for moderate pain. Reported on 05/08/2015, Disp: , Rfl: 0   Social History: Reviewed -  reports that she has quit smoking. Her smoking use included Cigarettes. She has a 1.50 pack-year smoking history. She quit smokeless tobacco use about 3 weeks ago.  Objective Findings:  Vitals: Blood pressure 114/78, pulse (!) 56, weight 145 lb 12.8 oz (66.1 kg).  Physical Examination: General appearance - alert, well appearing, and in no distress Mental status - alert, oriented to person, place, and time Breasts - breasts appear normal, no suspicious masses, no skin or nipple changes or axillary nodes. Right breast nipple tenderness. Suspect plugged duct    Assessment & Plan:   A:  1. Breast  nipple tenderness, suspect plugged duct  2. Requesting refill of Estrace cream for atrophic vaginitis   P:  1. Will rx 7 day course of doxycycline   2. Local heat 3. Recheck breast 4 weeks 4. Refill Estrace cream, change dose to 0.5 g (qd x3/week)       By signing my name below, I, Hansel Feinstein, attest that this documentation has been prepared under the direction and in the presence of Jonnie Kind, MD. Electronically Signed: Hansel Feinstein, ED Scribe. 02/10/16. 10:42 AM.  I personally performed the services described in this documentation, which was SCRIBED in my presence. The recorded information has been reviewed and considered accurate. It has been edited as necessary during review. Jonnie Kind, MD

## 2016-02-10 NOTE — Patient Instructions (Signed)
Mastitis Mastitis is inflammation of the breast tissue. It occurs most often in women who are breastfeeding, but it can also affect other women, and even sometimes men. CAUSES  Mastitis is usually caused by a bacterial infection. Bacteria enter the breast tissue through cuts or openings in the skin. Typically, this occurs with breastfeeding because of cracked or irritated skin. Sometimes, it can occur even when there is no opening in the skin. It can be associated with plugged milk (lactiferous) ducts. Nipple piercing can also lead to mastitis. Also, some forms of breast cancer can cause mastitis. SIGNS AND SYMPTOMS   Swelling, redness, tenderness, and pain in an area of the breast.  Swelling of the glands under the arm on the same side.  Fever. If an infection is allowed to progress, a collection of pus (abscess) may develop. DIAGNOSIS  Your health care provider can usually diagnose mastitis based on your symptoms and a physical exam. Tests may be done to help confirm the diagnosis. These may include:   Removal of pus from the breast by applying pressure to the area. This pus can be examined in the lab to determine which bacteria are present. If an abscess has developed, the fluid in the abscess can be removed with a needle. This can also be used to confirm the diagnosis and determine the bacteria present. In most cases, pus will not be present.  Blood tests to determine if your body is fighting a bacterial infection.  Mammogram or ultrasound tests to rule out other problems or diseases. TREATMENT  Antibiotic medicine is used to treat a bacterial infection. Your health care provider will determine which bacteria are most likely causing the infection and will select an appropriate antibiotic. This is sometimes changed based on the results of tests performed to identify the bacteria, or if there is no response to the antibiotic selected. Antibiotics are usually given by mouth. You may also be  given medicine for pain. Mastitis that occurs with breastfeeding will sometimes go away on its own, so your health care provider may choose to wait 24 hours after first seeing you to decide whether a prescription medicine is needed. HOME CARE INSTRUCTIONS   Only take over-the-counter or prescription medicines for pain, fever, or discomfort as directed by your health care provider.  If your health care provider prescribed an antibiotic, take the medicine as directed. Make sure you finish it even if you start to feel better.  Do not wear a tight or underwire bra. Wear a soft, supportive bra.  Increase your fluid intake, especially if you have a fever.  Women who are breastfeeding should follow these instructions:  Continue to empty the breast. Your health care provider can tell you whether this milk is safe for your infant or needs to be thrown out. You may be told to stop nursing until your health care provider thinks it is safe for your baby. Use a breast pump if you are advised to stop nursing.  Keep your nipples clean and dry.  Empty the first breast completely before going to the other breast. If your baby is not emptying your breasts completely for some reason, use a breast pump to empty your breasts.  If you go back to work, pump your breasts while at work to stay in time with your nursing schedule.  Avoid allowing your breasts to become overly filled with milk (engorged). SEEK MEDICAL CARE IF:   You have pus-like discharge from the breast.  Your symptoms do not   improve with the treatment prescribed by your health care provider within 2 days. SEEK IMMEDIATE MEDICAL CARE IF:   Your pain and swelling are getting worse.  You have pain that is not controlled with medicine.  You have a red line extending from the breast toward your armpit.  You have a fever or persistent symptoms for more than 2-3 days.  You have a fever and your symptoms suddenly get worse. This information is  not intended to replace advice given to you by your health care provider. Make sure you discuss any questions you have with your health care provider. Document Released: 01/05/2005 Document Revised: 01/10/2013 Document Reviewed: 08/05/2012 Elsevier Interactive Patient Education  2017 [employer].

## 2016-02-11 DIAGNOSIS — G43719 Chronic migraine without aura, intractable, without status migrainosus: Secondary | ICD-10-CM | POA: Diagnosis not present

## 2016-02-17 DIAGNOSIS — G43719 Chronic migraine without aura, intractable, without status migrainosus: Secondary | ICD-10-CM | POA: Diagnosis not present

## 2016-02-17 DIAGNOSIS — M542 Cervicalgia: Secondary | ICD-10-CM | POA: Diagnosis not present

## 2016-02-17 DIAGNOSIS — R51 Headache: Secondary | ICD-10-CM | POA: Diagnosis not present

## 2016-02-17 DIAGNOSIS — M791 Myalgia: Secondary | ICD-10-CM | POA: Diagnosis not present

## 2016-02-27 ENCOUNTER — Other Ambulatory Visit: Payer: Self-pay | Admitting: Family Medicine

## 2016-02-27 ENCOUNTER — Ambulatory Visit: Payer: Medicare HMO

## 2016-03-03 ENCOUNTER — Ambulatory Visit: Payer: Medicare HMO

## 2016-03-03 DIAGNOSIS — G43719 Chronic migraine without aura, intractable, without status migrainosus: Secondary | ICD-10-CM | POA: Diagnosis not present

## 2016-03-06 ENCOUNTER — Other Ambulatory Visit: Payer: Self-pay | Admitting: Nurse Practitioner

## 2016-03-06 DIAGNOSIS — K313 Pylorospasm, not elsewhere classified: Secondary | ICD-10-CM

## 2016-03-06 DIAGNOSIS — K3184 Gastroparesis: Secondary | ICD-10-CM

## 2016-03-06 DIAGNOSIS — K219 Gastro-esophageal reflux disease without esophagitis: Secondary | ICD-10-CM

## 2016-03-06 DIAGNOSIS — K5909 Other constipation: Secondary | ICD-10-CM

## 2016-03-23 ENCOUNTER — Other Ambulatory Visit: Payer: Self-pay | Admitting: *Deleted

## 2016-03-23 ENCOUNTER — Telehealth: Payer: Self-pay | Admitting: *Deleted

## 2016-03-23 ENCOUNTER — Encounter: Payer: Self-pay | Admitting: Obstetrics and Gynecology

## 2016-03-23 ENCOUNTER — Ambulatory Visit (INDEPENDENT_AMBULATORY_CARE_PROVIDER_SITE_OTHER): Payer: Medicare HMO | Admitting: Obstetrics and Gynecology

## 2016-03-23 VITALS — BP 118/64 | HR 56 | Ht 66.0 in | Wt 143.0 lb

## 2016-03-23 DIAGNOSIS — N6459 Other signs and symptoms in breast: Secondary | ICD-10-CM | POA: Diagnosis not present

## 2016-03-23 DIAGNOSIS — N644 Mastodynia: Secondary | ICD-10-CM

## 2016-03-23 NOTE — Progress Notes (Addendum)
Patient ID: Tracey Daniel, female   DOB: 1980-03-05, 36 y.o.   MRN: [id number]   [facility] Visit  @DATE @            Patient name: Tracey Daniel MRN [id number]  Date of birth: [birth year]  CC & HPI:   Chief Complaint  Patient presents with  . Follow-up    rt breast, pt states is worse than last visit     Prestina Jann Bloor is a 36 y.o. female presenting today for recheck of right nipple pain. Pt was last seen in the office on 02/10/16 for the same complaint, a plugged duct was suspected and she was given rx for 7 day course of doxycycline along with instructions to apply local heat. Per pt, her pain is worse since the last visit and is exacerbated with direct contact. Per pt, her pain is waxing and waning and at its worst is 10/10 and brings her to tears. Pt states mild temporary relief with hot showers. She denies any bleeding or discharge. She does note her nipple on the right is more pronounced than the left, and this is a new issue.   ROS:  ROS +right nipple pain  No nipple bleeding or discharge.   Pertinent History Reviewed:   Reviewed: Significant for POF, atrophic vaginitis  Medical         Past Medical History:  Diagnosis Date  . Anemia of other chronic disease 11/29/2012  . Asthma   . Back pain, chronic   . Chronic abdominal pain   . Constipation   . COPD (chronic obstructive pulmonary disease) ([place])   . Cyst of skin    mid spine area  . Depression 2000   h/o suicidal ideation  . Gastric outlet obstruction   . Gastritis   . Gastroparesis   . Migraine   . Migraines   . Nausea and vomiting    recurrent  . Nicotine addiction   . Osteoporosis   . Peptic ulcer disease   . Pyloric spasm 03/30/2011  . Seasonal allergies   . Sinusitis   . Substance abuse 2008   marijuana  . Vitamin D deficiency                               Surgical Hx:    Past Surgical History:  Procedure Laterality Date  . BALLOON DILATION N/A 02/09/2013   Procedure: BALLOON  DILATION;  Surgeon: Missy Sabins, MD;  [facility];  Service: Endoscopy;  Laterality: N/A;  . BOTOX INJECTION N/A 02/09/2013   Procedure: BOTOX INJECTION;  Surgeon: Missy Sabins, MD;  [facility];  Service: Endoscopy;  Laterality: N/A;  possible balloon  . BOTOX INJECTION N/A 10/24/2015   Procedure: BOTOX INJECTION;  Surgeon: Daneil Dolin, MD;  [facility];  Service: Endoscopy;  Laterality: N/A;  . CARPAL TUNNEL RELEASE     left hand  . CHOLECYSTECTOMY     ?2002  . COLONOSCOPY WITH PROPOFOL N/A 09/20/2014   RMR: Normal ileo-colonoscopy  . ESOPHAGEAL DILATION  12/25/2010   Procedure: ESOPHAGEAL DILATION;  Surgeon: Dorothyann Peng, MD;  [facility];  Service: Endoscopy;;  . ESOPHAGOGASTRODUODENOSCOPY  12/25/2010   Dorothyann Peng, MD;  moderate gastritis, ?goo secondary to pylorspasm. BX showed reactive gstropathy no h.pyori, SB mucosa with intramucosal lymphocytosis and partial villous blunting (TTG 4.0 normal)  . ESOPHAGOGASTRODUODENOSCOPY N/A 11/14/2012  [id number] hiatal hernia. Abnormal gastric mucosa of  uncertain significance-status post biopsy. Subjectively, patient may have recurrent symptomatic, pylorospasm.  . ESOPHAGOGASTRODUODENOSCOPY (EGD) WITH PROPOFOL N/A 02/09/2013   elongated stomach, partial lower esophageal ring widely patent. No obvious pyloric stenosis s/p Botox  . ESOPHAGOGASTRODUODENOSCOPY (EGD) WITH PROPOFOL N/A 10/24/2015   Procedure: ESOPHAGOGASTRODUODENOSCOPY (EGD) WITH PROPOFOL;  Surgeon: Daneil Dolin, MD;  [facility];  Service: Endoscopy;  Laterality: N/A;  830   . laser surgery on cervix    . TOE SURGERY     Medications: Reviewed & Updated - see associated section                       Current Outpatient Prescriptions:  .  albuterol (PROVENTIL HFA;VENTOLIN HFA) 108 (90 Base) MCG/ACT inhaler, Inhale 2 puffs into the lungs every 6 (six) hours as needed. Shortness of breath, Disp: 6.7 g, Rfl: 4 .  azelastine  (ASTELIN) 0.1 % nasal spray, Place 2 sprays into both nostrils 2 (two) times daily. Use in each nostril as directed, Disp: 30 mL, Rfl: 12 .  BIOTIN PO, Take 1 tablet by mouth daily., Disp: , Rfl:  .  budesonide-formoterol (SYMBICORT) 80-4.5 MCG/ACT inhaler, Inhale 2 puffs into the lungs 2 (two) times daily., Disp: 1 Inhaler, Rfl: 4 .  calcium-vitamin D (OSCAL WITH D) 500-200 MG-UNIT per tablet, Take 1 tablet by mouth 2 (two) times daily., Disp: , Rfl:  .  clobetasol cream (TEMOVATE) [id number] %, Apply 1 application topically 2 (two) times daily as needed (rash). Apply to rash, Disp: , Rfl:  .  dicyclomine (BENTYL) 20 MG tablet, Take 1 tablet (20 mg total) by mouth 2 (two) times daily. (Patient not taking: Reported on 02/10/2016), Disp: 20 tablet, Rfl: 0 .  doxycycline (VIBRAMYCIN) 100 MG capsule, Take 1 capsule (100 mg total) by mouth 2 (two) times daily., Disp: 14 capsule, Rfl: 0 .  DULoxetine (CYMBALTA) 30 MG capsule, Take 1 capsule (30 mg total) by mouth daily., Disp: 30 capsule, Rfl: 3 .  ergocalciferol (VITAMIN D2) 50000 UNITS capsule, Take 1 capsule (50,000 Units total) by mouth once a week. One capsule once weekly (Patient taking differently: Take 50,000 Units by mouth every Friday. One capsule once weekly), Disp: 12 capsule, Rfl: 1 .  estradiol (ESTRACE VAGINAL) 0.1 MG/GM vaginal cream, Place [id number] Applicatorfuls vaginally 3 (three) times a week. As directed, Disp: 42.5 g, Rfl: 4 .  estradiol (ESTRACE) 1 MG tablet, Take 1 tablet (1 mg total) by mouth daily., Disp: 30 tablet, Rfl: 11 .  feeding supplement (ENSURE CLINICAL STRENGTH) LIQD, Take 237 mLs by mouth 3 (three) times daily with meals. (Patient taking differently: Take 237 mLs by mouth 2 (two) times daily. ), Disp: 10 Bottle, Rfl: 3 .  fluticasone (FLONASE) 50 MCG/ACT nasal spray, Place 2 sprays into both nostrils daily., Disp: 16 g, Rfl: 6 .  gabapentin (NEURONTIN) 100 MG capsule, Take 100 mg by mouth 3 (three) times daily., Disp: , Rfl:  .   LINZESS 290 MCG CAPS capsule, TAKE 1 CAPSULE(290 MCG) BY MOUTH DAILY, Disp: 30 capsule, Rfl: 3 .  loratadine (CLARITIN) 10 MG tablet, Take 1 tablet (10 mg total) by mouth daily., Disp: 30 tablet, Rfl: 5 .  metoCLOPramide (REGLAN) 10 MG tablet, Take 1 tablet (10 mg total) by mouth every 6 (six) hours. (Patient taking differently: Take 10 mg by mouth 2 (two) times daily. ), Disp: 30 tablet, Rfl: 1 .  NUCYNTA 50 MG TABS tablet, Take  50 mg by mouth every 8 (eight) hours as needed for moderate pain. Reported on 05/08/2015, Disp: , Rfl: 0 .  ondansetron (ZOFRAN-ODT) 4 MG disintegrating tablet, DISSOLVE 1 TABLET IN MOUTH EVERY 8 HOURS AS NEEDED FOR NAUSEA, Disp: 30 tablet, Rfl: 0 .  pantoprazole (PROTONIX) 40 MG tablet, Take 1 tablet (40 mg total) by mouth 2 (two) times daily before a meal., Disp: 180 tablet, Rfl: 3 .  potassium chloride SA (K-DUR,KLOR-CON) 20 MEQ tablet, Take 1 tablet (20 mEq total) by mouth daily., Disp: 30 tablet, Rfl: 0 .  pravastatin (PRAVACHOL) 20 MG tablet, TAKE 1 TABLET(20 MG) BY MOUTH DAILY, Disp: 90 tablet, Rfl: 1 .  progesterone (PROMETRIUM) 200 MG capsule, TAKE 1 CAPSULE BY MOUTH EVERY DAY. EVERY THIRD MONTH, [place]AN[place], [place], JULY, AND OCTOBER FOR 2 WEEKS (Patient taking differently: TAKES 1 CAP DAILY FOR 2 WEEKS EVERY THIRD MONTH, [place], [place], [place], AND October), Disp: 14 capsule, Rfl: 3 .  sucralfate (CARAFATE) 1 g tablet, Crush one tablet and mix in applesauce, take every morning and at bedtime as needed for stomach burning., Disp: 60 tablet, Rfl: 3 .  tiZANidine (ZANAFLEX) 4 MG tablet, Take 4 mg by mouth 3 (three) times daily as needed for muscle spasms., Disp: , Rfl:  .  zonisamide (ZONEGRAN) 25 MG capsule, 100 mg daily. , Disp: , Rfl:    Social History: Reviewed -  reports that she has quit smoking. Her smoking use included Cigarettes. She has a 1.50 pack-year smoking history. She quit smokeless tobacco use about 2 months ago.  Objective Findings:  Vitals: Blood pressure  118/64, pulse (!) 56, height 5\' 6"  (1.676 m), weight 143 lb (64.9 kg).  Physical Examination: General appearance - alert, well appearing, and in no distress Mental status - alert, oriented to person, place, and time Breasts - breasts appear normal, no skin or nipple changes or axillary nodes. Right breast nipple tenderness. No skin changes. No nipple discharge. Slightly enlarged areola on the right, which is more prominent than the left, which pt notes is a change. There is no distinct mass in the nipple itself.    Assessment & Plan:   A:  1. Persistent right breast nipple tenderness, unrelieved after 7 day course of doxy    P:  1. Obtain high resolution [place] of rt breast, and comparison views of left breast and mammogram, as per protocol for Radiology.    By signing my name below, I, Hansel Feinstein, attest that this documentation has been prepared under the direction and in the presence of Jonnie Kind, MD. Electronically Signed: Hansel Feinstein, ED Scribe. 03/23/16. 10:04 AM.  I personally performed the services described in this documentation, which was SCRIBED in my presence. The recorded information has been reviewed and considered accurate. It has been edited as necessary during review. Jonnie Kind, MD

## 2016-03-23 NOTE — Telephone Encounter (Signed)
Orders placed for U/S. Izora Gala from [facility] radiology to call and schedule appointment.

## 2016-03-31 DIAGNOSIS — M791 Myalgia: Secondary | ICD-10-CM | POA: Diagnosis not present

## 2016-03-31 DIAGNOSIS — G43719 Chronic migraine without aura, intractable, without status migrainosus: Secondary | ICD-10-CM | POA: Diagnosis not present

## 2016-03-31 DIAGNOSIS — R51 Headache: Secondary | ICD-10-CM | POA: Diagnosis not present

## 2016-03-31 DIAGNOSIS — M542 Cervicalgia: Secondary | ICD-10-CM | POA: Diagnosis not present

## 2016-04-01 ENCOUNTER — Telehealth: Payer: Self-pay | Admitting: Obstetrics and Gynecology

## 2016-04-01 NOTE — Telephone Encounter (Signed)
Pt called stating that she was suppose to have a Ultrasound done at the hospital before her appointment with Dr. Glo Herring on the 21st of march. Please contact pt

## 2016-04-01 NOTE — Telephone Encounter (Signed)
Patient called stating she has not heard from [facility] concerning her U/S. Spoke with Izora Gala at [facility] who stated she will call patient to make appointment.

## 2016-04-08 ENCOUNTER — Ambulatory Visit: Payer: Medicare HMO | Admitting: Obstetrics and Gynecology

## 2016-04-14 DIAGNOSIS — G43719 Chronic migraine without aura, intractable, without status migrainosus: Secondary | ICD-10-CM | POA: Diagnosis not present

## 2016-04-17 ENCOUNTER — Other Ambulatory Visit: Payer: Self-pay | Admitting: Nurse Practitioner

## 2016-04-17 DIAGNOSIS — K313 Pylorospasm, not elsewhere classified: Secondary | ICD-10-CM

## 2016-04-17 DIAGNOSIS — K3184 Gastroparesis: Secondary | ICD-10-CM

## 2016-04-17 DIAGNOSIS — K5909 Other constipation: Secondary | ICD-10-CM

## 2016-04-17 DIAGNOSIS — K219 Gastro-esophageal reflux disease without esophagitis: Secondary | ICD-10-CM

## 2016-04-18 ENCOUNTER — Other Ambulatory Visit: Payer: Self-pay | Admitting: Gastroenterology

## 2016-04-20 ENCOUNTER — Other Ambulatory Visit (HOSPITAL_COMMUNITY): Payer: Self-pay

## 2016-04-20 ENCOUNTER — Other Ambulatory Visit (HOSPITAL_COMMUNITY): Payer: Self-pay | Admitting: Oncology

## 2016-04-21 ENCOUNTER — Ambulatory Visit (HOSPITAL_COMMUNITY)
Admission: RE | Admit: 2016-04-21 | Discharge: 2016-04-21 | Disposition: A | Discharge status: Home / Self Care | Payer: Medicare HMO | Source: Ambulatory Visit | Attending: Obstetrics and Gynecology | Admitting: Obstetrics and Gynecology

## 2016-04-21 DIAGNOSIS — E2839 Other primary ovarian failure: Secondary | ICD-10-CM | POA: Diagnosis not present

## 2016-04-21 DIAGNOSIS — N644 Mastodynia: Secondary | ICD-10-CM | POA: Diagnosis not present

## 2016-04-21 DIAGNOSIS — N62 Hypertrophy of breast: Secondary | ICD-10-CM | POA: Diagnosis not present

## 2016-04-21 DIAGNOSIS — R922 Inconclusive mammogram: Secondary | ICD-10-CM | POA: Diagnosis not present

## 2016-04-21 DIAGNOSIS — N6489 Other specified disorders of breast: Secondary | ICD-10-CM | POA: Diagnosis not present

## 2016-04-22 ENCOUNTER — Encounter: Payer: Self-pay | Admitting: Obstetrics and Gynecology

## 2016-04-22 ENCOUNTER — Ambulatory Visit (INDEPENDENT_AMBULATORY_CARE_PROVIDER_SITE_OTHER): Payer: Medicare HMO | Admitting: Obstetrics and Gynecology

## 2016-04-22 VITALS — BP 90/60 | HR 60 | Ht 66.0 in | Wt 147.0 lb

## 2016-04-22 DIAGNOSIS — N644 Mastodynia: Secondary | ICD-10-CM

## 2016-04-22 NOTE — Progress Notes (Signed)
[facility] Visit  @DATE @            Patient name: Tracey Daniel MRN [id number]  Date of birth: October 28, 1980  CC & HPI:  Tracey Daniel is a 36 y.o. female presenting today for persistent right nipple pain that has been ongoing since at least January 2018. She has had negative mammogram and [place] testing. She denies associated drainage or bleeding. She denies similar symptoms to the left breast.   ROS:  ROS +right breast/nipple pain -fever -nipple drainage or bleeding   Pertinent History Reviewed:   Reviewed: Significant for  Medical         Past Medical History:  Diagnosis Date  . Anemia of other chronic disease 11/29/2012  . Asthma   . Back pain, chronic   . Chronic abdominal pain   . Constipation   . COPD (chronic obstructive pulmonary disease) ([place])   . Cyst of skin    mid spine area  . Depression 2000   h/o suicidal ideation  . Gastric outlet obstruction   . Gastritis   . Gastroparesis   . Migraine   . Migraines   . Nausea and vomiting    recurrent  . Nicotine addiction   . Osteoporosis   . Peptic ulcer disease   . Pyloric spasm 03/30/2011  . Seasonal allergies   . Sinusitis   . Substance abuse 2008   marijuana  . Vitamin D deficiency                               Surgical Hx:    Past Surgical History:  Procedure Laterality Date  . BALLOON DILATION N/A 02/09/2013   Procedure: BALLOON DILATION;  Surgeon: Missy Sabins, MD;  [facility];  Service: Endoscopy;  Laterality: N/A;  . BOTOX INJECTION N/A 02/09/2013   Procedure: BOTOX INJECTION;  Surgeon: Missy Sabins, MD;  [facility];  Service: Endoscopy;  Laterality: N/A;  possible balloon  . BOTOX INJECTION N/A 10/24/2015   Procedure: BOTOX INJECTION;  Surgeon: Daneil Dolin, MD;  [facility];  Service: Endoscopy;  Laterality: N/A;  . CARPAL TUNNEL RELEASE     left hand  . CHOLECYSTECTOMY     ?2002  . COLONOSCOPY WITH PROPOFOL N/A 09/20/2014   RMR: Normal  ileo-colonoscopy  . ESOPHAGEAL DILATION  12/25/2010   Procedure: ESOPHAGEAL DILATION;  Surgeon: Dorothyann Peng, MD;  [facility];  Service: Endoscopy;;  . ESOPHAGOGASTRODUODENOSCOPY  12/25/2010   Dorothyann Peng, MD;  moderate gastritis, ?goo secondary to pylorspasm. BX showed reactive gstropathy no h.pyori, SB mucosa with intramucosal lymphocytosis and partial villous blunting (TTG 4.0 normal)  . ESOPHAGOGASTRODUODENOSCOPY N/A 11/14/2012   MBE:MLJQG hiatal hernia. Abnormal gastric mucosa of  uncertain significance-status post biopsy. Subjectively, patient may have recurrent symptomatic, pylorospasm.  . ESOPHAGOGASTRODUODENOSCOPY (EGD) WITH PROPOFOL N/A 02/09/2013   elongated stomach, partial lower esophageal ring widely patent. No obvious pyloric stenosis s/p Botox  . ESOPHAGOGASTRODUODENOSCOPY (EGD) WITH PROPOFOL N/A 10/24/2015   Procedure: ESOPHAGOGASTRODUODENOSCOPY (EGD) WITH PROPOFOL;  Surgeon: Daneil Dolin, MD;  [facility];  Service: Endoscopy;  Laterality: N/A;  830   . laser surgery on cervix    . TOE SURGERY     Medications: Reviewed & Updated - see associated section  Current Outpatient Prescriptions:  .  albuterol (PROVENTIL HFA;VENTOLIN HFA) 108 (90 Base) MCG/ACT inhaler, Inhale 2 puffs into the lungs every 6 (six) hours as needed. Shortness of breath, Disp: 6.7 g, Rfl: 4 .  azelastine (ASTELIN) 0.1 % nasal spray, Place 2 sprays into both nostrils 2 (two) times daily. Use in each nostril as directed, Disp: 30 mL, Rfl: 12 .  BIOTIN PO, Take 1 tablet by mouth daily., Disp: , Rfl:  .  budesonide-formoterol (SYMBICORT) 80-4.5 MCG/ACT inhaler, Inhale 2 puffs into the lungs 2 (two) times daily., Disp: 1 Inhaler, Rfl: 4 .  calcium-vitamin D (OSCAL WITH D) 500-200 MG-UNIT per tablet, Take 1 tablet by mouth 2 (two) times daily., Disp: , Rfl:  .  clobetasol cream (TEMOVATE) 6.73 %, Apply 1 application topically 2 (two) times daily as needed (rash).  Apply to rash, Disp: , Rfl:  .  dicyclomine (BENTYL) 20 MG tablet, Take 1 tablet (20 mg total) by mouth 2 (two) times daily., Disp: 20 tablet, Rfl: 0 .  doxycycline (VIBRAMYCIN) 100 MG capsule, Take 1 capsule (100 mg total) by mouth 2 (two) times daily., Disp: 14 capsule, Rfl: 0 .  DULoxetine (CYMBALTA) 30 MG capsule, Take 1 capsule (30 mg total) by mouth daily., Disp: 30 capsule, Rfl: 3 .  ergocalciferol (VITAMIN D2) 50000 UNITS capsule, Take 1 capsule (50,000 Units total) by mouth once a week. One capsule once weekly (Patient taking differently: Take 50,000 Units by mouth every Friday. One capsule once weekly), Disp: 12 capsule, Rfl: 1 .  estradiol (ESTRACE) 1 MG tablet, Take 1 tablet (1 mg total) by mouth daily., Disp: 30 tablet, Rfl: 11 .  EVENING PRIMROSE OIL PO, Take by mouth 2 (two) times daily., Disp: , Rfl:  .  feeding supplement (ENSURE CLINICAL STRENGTH) LIQD, Take 237 mLs by mouth 3 (three) times daily with meals. (Patient taking differently: Take 237 mLs by mouth 2 (two) times daily. ), Disp: 10 Bottle, Rfl: 3 .  fluticasone (FLONASE) 50 MCG/ACT nasal spray, Place 2 sprays into both nostrils daily., Disp: 16 g, Rfl: 6 .  gabapentin (NEURONTIN) 100 MG capsule, Take 100 mg by mouth 3 (three) times daily., Disp: , Rfl:  .  LINZESS 290 MCG CAPS capsule, TAKE 1 CAPSULE(290 MCG) BY MOUTH DAILY, Disp: 30 capsule, Rfl: 3 .  loratadine (CLARITIN) 10 MG tablet, Take 1 tablet (10 mg total) by mouth daily., Disp: 30 tablet, Rfl: 5 .  metoCLOPramide (REGLAN) 10 MG tablet, Take 1 tablet (10 mg total) by mouth every 6 (six) hours. (Patient taking differently: Take 10 mg by mouth 2 (two) times daily. ), Disp: 30 tablet, Rfl: 1 .  NUCYNTA 50 MG TABS tablet, Take 50 mg by mouth every 8 (eight) hours as needed for moderate pain. Reported on 05/08/2015, Disp: , Rfl: 0 .  ondansetron (ZOFRAN-ODT) 4 MG disintegrating tablet, DISSOLVE 1 TABLET IN MOUTH EVERY 8 HOURS AS NEEDED FOR NAUSEA, Disp: 30 tablet, Rfl:  0 .  pantoprazole (PROTONIX) 40 MG tablet, Take 1 tablet (40 mg total) by mouth 2 (two) times daily before a meal., Disp: 180 tablet, Rfl: 3 .  potassium chloride SA (K-DUR,KLOR-CON) 20 MEQ tablet, Take 20 mEq by mouth daily. , Disp: , Rfl:  .  pravastatin (PRAVACHOL) 20 MG tablet, TAKE 1 TABLET(20 MG) BY MOUTH DAILY, Disp: 90 tablet, Rfl: 1 .  PREMARIN vaginal cream, Place 1 Applicatorful vaginally. Twice a week, Disp: , Rfl:  .  progesterone (PROMETRIUM) 200 MG capsule, TAKE 1 CAPSULE BY  MOUTH EVERY DAY. EVERY THIRD MONTH, JANUARY, APRIL, JULY, AND OCTOBER FOR 2 WEEKS (Patient taking differently: TAKES 1 CAP DAILY FOR 2 WEEKS EVERY THIRD MONTH, [place], APRIL, [place], AND October), Disp: 14 capsule, Rfl: 3 .  sucralfate (CARAFATE) 1 g tablet, TAKE 1 TABLET BY MOUTH EVERY MORNING AND 1 TABLET AT BEDTIME AS NEEDED FOR STOMACH BURNING( CRUSH 1 TABLET AND MIX IN APPLESAUCE), Disp: 180 tablet, Rfl: 3 .  tiZANidine (ZANAFLEX) 4 MG tablet, Take 4 mg by mouth 3 (three) times daily as needed for muscle spasms., Disp: , Rfl:  .  zonisamide (ZONEGRAN) 25 MG capsule, 50 mg 3 (three) times daily. , Disp: , Rfl:    Social History: Reviewed -  reports that she has quit smoking. Her smoking use included Cigarettes. She has a 1.50 pack-year smoking history. She has never used smokeless tobacco.  Objective Findings:  Vitals: Blood pressure 90/60, pulse 60, height 5\' 6"  (1.676 m), weight 147 lb (66.7 kg).  Physical Examination: General appearance - alert, well appearing, and in no distress Mental status - alert, oriented to person, place, and time Breast: not indicated Pelvic - examination not indicated   Assessment & Plan:   A:  1. Unilateral breast and nipple pain with negative mammogram and [place]  P:  1. Use vitamin E and primrose oil  2. Follow up in 6 months or PRN    By signing my name below, I, Sonum Patel, attest that this documentation has been prepared under the direction and in the presence of  Jonnie Kind, MD. Electronically Signed: Sonum Patel, Education administrator. 04/22/16. 10:48 AM.  I personally performed the services described in this documentation, which was SCRIBED in my presence. The recorded information has been reviewed and considered accurate. It has been edited as necessary during review. Jonnie Kind, MD

## 2016-04-24 ENCOUNTER — Other Ambulatory Visit (HOSPITAL_COMMUNITY): Payer: Self-pay

## 2016-04-24 DIAGNOSIS — E2839 Other primary ovarian failure: Secondary | ICD-10-CM

## 2016-04-24 DIAGNOSIS — E288 Other ovarian dysfunction: Principal | ICD-10-CM

## 2016-04-24 MED ORDER — POTASSIUM CHLORIDE CRYS ER 20 MEQ PO TBCR: 20.0000 meq | EXTENDED_RELEASE_TABLET | Freq: Every day | ORAL | 3 refills | Status: DC

## 2016-04-24 NOTE — Telephone Encounter (Signed)
Received refill request from patients pharmacy for Potassium. Reviewed with NP, chart checked and refilled.

## 2016-05-17 ENCOUNTER — Other Ambulatory Visit: Payer: Self-pay | Admitting: Family Medicine

## 2016-05-17 DIAGNOSIS — J45909 Unspecified asthma, uncomplicated: Secondary | ICD-10-CM

## 2016-05-18 ENCOUNTER — Other Ambulatory Visit: Payer: Self-pay | Admitting: Family Medicine

## 2016-05-19 ENCOUNTER — Other Ambulatory Visit: Payer: Self-pay | Admitting: Nurse Practitioner

## 2016-05-19 DIAGNOSIS — K3184 Gastroparesis: Secondary | ICD-10-CM

## 2016-05-19 DIAGNOSIS — K5909 Other constipation: Secondary | ICD-10-CM

## 2016-05-19 DIAGNOSIS — K313 Pylorospasm, not elsewhere classified: Secondary | ICD-10-CM

## 2016-05-19 DIAGNOSIS — K219 Gastro-esophageal reflux disease without esophagitis: Secondary | ICD-10-CM

## 2016-05-20 DIAGNOSIS — M542 Cervicalgia: Secondary | ICD-10-CM | POA: Diagnosis not present

## 2016-05-20 DIAGNOSIS — R51 Headache: Secondary | ICD-10-CM | POA: Diagnosis not present

## 2016-05-20 DIAGNOSIS — G43719 Chronic migraine without aura, intractable, without status migrainosus: Secondary | ICD-10-CM | POA: Diagnosis not present

## 2016-05-20 DIAGNOSIS — M791 Myalgia: Secondary | ICD-10-CM | POA: Diagnosis not present

## 2016-05-28 DIAGNOSIS — G43719 Chronic migraine without aura, intractable, without status migrainosus: Secondary | ICD-10-CM | POA: Diagnosis not present

## 2016-06-01 ENCOUNTER — Ambulatory Visit: Payer: Medicare HMO | Admitting: Family Medicine

## 2016-06-04 ENCOUNTER — Encounter (HOSPITAL_COMMUNITY): Payer: Self-pay | Admitting: Oncology

## 2016-06-04 ENCOUNTER — Ambulatory Visit (INDEPENDENT_AMBULATORY_CARE_PROVIDER_SITE_OTHER): Payer: Medicare HMO

## 2016-06-04 ENCOUNTER — Encounter (HOSPITAL_COMMUNITY): Payer: Medicare HMO | Attending: Oncology | Admitting: Oncology

## 2016-06-04 ENCOUNTER — Encounter (HOSPITAL_COMMUNITY): Payer: Medicare HMO

## 2016-06-04 ENCOUNTER — Other Ambulatory Visit: Payer: Self-pay

## 2016-06-04 VITALS — BP 118/70 | HR 54 | Ht 66.0 in | Wt 146.0 lb

## 2016-06-04 DIAGNOSIS — R110 Nausea: Secondary | ICD-10-CM

## 2016-06-04 DIAGNOSIS — D500 Iron deficiency anemia secondary to blood loss (chronic): Secondary | ICD-10-CM

## 2016-06-04 DIAGNOSIS — Z0000 Encounter for general adult medical examination without abnormal findings: Secondary | ICD-10-CM | POA: Diagnosis not present

## 2016-06-04 DIAGNOSIS — F17218 Nicotine dependence, cigarettes, with other nicotine-induced disorders: Secondary | ICD-10-CM

## 2016-06-04 DIAGNOSIS — H9190 Unspecified hearing loss, unspecified ear: Secondary | ICD-10-CM

## 2016-06-04 DIAGNOSIS — E559 Vitamin D deficiency, unspecified: Secondary | ICD-10-CM

## 2016-06-04 LAB — IRON AND TIBC
Iron: 56 ug/dL (ref 28–170)
Saturation Ratios: 18 % (ref 10.4–31.8)
TIBC: 304 ug/dL (ref 250–450)
UIBC: 248 ug/dL

## 2016-06-04 LAB — CBC WITH DIFFERENTIAL/PLATELET
Basophils Absolute: 0 10*3/uL (ref 0.0–0.1)
Basophils Relative: 0 %
Eosinophils Absolute: 0.1 10*3/uL (ref 0.0–0.7)
Eosinophils Relative: 1 %
HEMATOCRIT: 34.5 % — AB (ref 36.0–46.0)
HEMOGLOBIN: 11.3 g/dL — AB (ref 12.0–15.0)
LYMPHS ABS: 2.8 10*3/uL (ref 0.7–4.0)
LYMPHS PCT: 33 %
MCH: 29.7 pg (ref 26.0–34.0)
MCHC: 32.8 g/dL (ref 30.0–36.0)
MCV: 90.8 fL (ref 78.0–100.0)
Monocytes Absolute: 0.4 10*3/uL (ref 0.1–1.0)
Monocytes Relative: 5 %
NEUTROS ABS: 5.3 10*3/uL (ref 1.7–7.7)
NEUTROS PCT: 61 %
PLATELETS: 170 10*3/uL (ref 150–400)
RBC: 3.8 MIL/uL — AB (ref 3.87–5.11)
RDW: 13.9 % (ref 11.5–15.5)
WBC: 8.6 10*3/uL (ref 4.0–10.5)

## 2016-06-04 LAB — RENAL FUNCTION PANEL
ANION GAP: 6 (ref 5–15)
Albumin: 4.7 g/dL (ref 3.5–5.0)
BUN: 15 mg/dL (ref 6–20)
CALCIUM: 9.5 mg/dL (ref 8.9–10.3)
CHLORIDE: 106 mmol/L (ref 101–111)
CO2: 27 mmol/L (ref 22–32)
Creatinine, Ser: 0.79 mg/dL (ref 0.44–1.00)
GLUCOSE: 83 mg/dL (ref 65–99)
PHOSPHORUS: 2.9 mg/dL (ref 2.5–4.6)
Potassium: 3.4 mmol/L — ABNORMAL LOW (ref 3.5–5.1)
Sodium: 139 mmol/L (ref 135–145)

## 2016-06-04 LAB — FOLATE: FOLATE: 14.1 ng/mL (ref >5.9)

## 2016-06-04 LAB — VITAMIN B12: Vitamin B-12: 430 pg/mL (ref 180–914)

## 2016-06-04 LAB — FERRITIN: Ferritin: 78 ng/mL (ref 11–307)

## 2016-06-04 NOTE — Telephone Encounter (Signed)
Patient's last Vitamin D level was 42 on 09/27/2015. Do you want to refill her Vit D?

## 2016-06-04 NOTE — Progress Notes (Addendum)
Tracey Helper, MD [address] [place] [address]  Iron deficiency anemia due to chronic blood loss  Cigarette nicotine dependence with other nicotine-induced disorder  CURRENT THERAPY: Observation  INTERVAL HISTORY: Tracey Daniel Counts 36 y.o. female returns for followup of intermittent iron deficiency anemia, intolerance to p.o. ferrous sulfate with nausea/vomiting, now needing intermittent IV iron replacement when indicated.  HPI Elements IDA  Location: Blood  Quality: Iron deficiency  Severity: Mild  Duration: Years  Context: In tolerant to PO iron  Timing:   Modifying Factors:   Associated Signs & Symptoms: Anemia   She had a recent history of right nipple pain since January 2018.  Mammogram and ultrasound have been negative.  She denies any drainage or bleeding.  She has a rash on her upper extremities.  She has been following with dermatology.  She has a cream for this rash but notes progression of the rash.  She reports constipation and she will be given a constipation sheet today.  Her nausea persists but significantly improved since stopping ferrous sulfate.  Weight is stable.    She quit smoking in Jan 2018.  Review of Systems  Constitutional: Negative.  Negative for chills, fever and weight loss.  HENT: Negative.   Eyes: Negative.   Respiratory: Negative.  Negative for cough.   Cardiovascular: Negative.  Negative for chest pain.  Gastrointestinal: Negative.  Negative for blood in stool, constipation, diarrhea, melena, nausea and vomiting.  Genitourinary: Negative.   Musculoskeletal: Negative.   Skin: Negative.   Neurological: Negative.  Negative for weakness.  Endo/Heme/Allergies: Negative.   Psychiatric/Behavioral: Negative.     Past Medical History:  Diagnosis Date  . Anemia of other chronic disease 11/29/2012  . Asthma   . Back pain, chronic   . Chronic abdominal pain   . Constipation   . COPD (chronic obstructive  pulmonary disease) ([place])   . Cyst of skin    mid spine area  . Depression 2000   h/o suicidal ideation  . Gastric outlet obstruction   . Gastritis   . Gastroparesis   . Migraine   . Migraines   . Nausea and vomiting    recurrent  . Nicotine addiction   . Osteoporosis   . Peptic ulcer disease   . Pyloric spasm 03/30/2011  . Seasonal allergies   . Sinusitis   . Substance abuse 2008   marijuana  . Vitamin D deficiency     Past Surgical History:  Procedure Laterality Date  . BALLOON DILATION N/A 02/09/2013   Procedure: BALLOON DILATION;  Surgeon: Missy Sabins, MD;  [facility];  Service: Endoscopy;  Laterality: N/A;  . BOTOX INJECTION N/A 02/09/2013   Procedure: BOTOX INJECTION;  Surgeon: Missy Sabins, MD;  [facility];  Service: Endoscopy;  Laterality: N/A;  possible balloon  . BOTOX INJECTION N/A 10/24/2015   Procedure: BOTOX INJECTION;  Surgeon: Daneil Dolin, MD;  [facility];  Service: Endoscopy;  Laterality: N/A;  . CARPAL TUNNEL RELEASE     left hand  . CHOLECYSTECTOMY     ?2002  . COLONOSCOPY WITH PROPOFOL N/A 09/20/2014   RMR: Normal ileo-colonoscopy  . ESOPHAGEAL DILATION  12/25/2010   Procedure: ESOPHAGEAL DILATION;  Surgeon: Dorothyann Peng, MD;  [facility];  Service: Endoscopy;;  . ESOPHAGOGASTRODUODENOSCOPY  12/25/2010   Dorothyann Peng, MD;  moderate gastritis, ?goo secondary to pylorspasm. BX showed reactive gstropathy no  h.pyori, SB mucosa with intramucosal lymphocytosis and partial villous blunting (TTG 4.0 normal)  . ESOPHAGOGASTRODUODENOSCOPY N/A 11/14/2012   GBT:DVVOH hiatal hernia. Abnormal gastric mucosa of  uncertain significance-status post biopsy. Subjectively, patient may have recurrent symptomatic, pylorospasm.  . ESOPHAGOGASTRODUODENOSCOPY (EGD) WITH PROPOFOL N/A 02/09/2013   elongated stomach, partial lower esophageal ring widely patent. No obvious pyloric stenosis s/p Botox  . ESOPHAGOGASTRODUODENOSCOPY (EGD)  WITH PROPOFOL N/A 10/24/2015   Procedure: ESOPHAGOGASTRODUODENOSCOPY (EGD) WITH PROPOFOL;  Surgeon: Daneil Dolin, MD;  [facility];  Service: Endoscopy;  Laterality: N/A;  830   . laser surgery on cervix    . TOE SURGERY      Family History  Problem Relation Age of Onset  . Diabetes Father   . Liver disease Father        liver transplant at [facility], age 36  . Lung cancer Mother 20  . Heart disease Maternal Grandmother   . Parkinson's disease Maternal Grandfather   . Multiple sclerosis Sister 26  . Depression Sister 53  . Alcohol abuse Sister   . Bipolar disorder Sister   . Schizophrenia Sister   . Colon cancer Neg Hx     Social History   Social History  . Marital status: Single    Spouse name: N/A  . Number of children: 0  . Years of education: N/A   Occupational History  . disability Unemployed   Social History Main Topics  . Smoking status: Former Smoker    Packs/day: 0.10    Years: 15.00    Types: Cigarettes    Quit date: 01/20/2016  . Smokeless tobacco: Never Used  . Alcohol use No  . Drug use: No  . Sexual activity: Yes    Birth control/ protection: None   Other Topics Concern  . None   Social History Narrative  . None     PHYSICAL EXAMINATION  ECOG PERFORMANCE STATUS: 1 - Symptomatic but completely ambulatory  Vitals:   06/04/16 1115  BP: 127/65  Pulse: 65  Resp: 16  Temp: 98.2 F (36.8 C)    GENERAL:alert, no distress, well nourished, well developed, comfortable, cooperative, smiling and unaccompanied SKIN: positive for: raised, papular, minimally erythematous rash on posterior aspect of arms HEAD: Normocephalic, No masses, lesions, tenderness or abnormalities EYES: normal, EOMI, Conjunctiva are pink and non-injected EARS: External ears normal OROPHARYNX:lips, buccal mucosa, and tongue normal and mucous membranes are moist  NECK: supple, no adenopathy, trachea midline LYMPH:  no palpable lymphadenopathy BREAST:not examined LUNGS:  clear to auscultation  HEART: regular rate & rhythm ABDOMEN:abdomen soft and normal bowel sounds BACK: Back symmetric, no curvature. EXTREMITIES:less then 2 second capillary refill, no joint deformities, effusion, or inflammation, no edema, no cyanosis  NEURO: alert & oriented x 3 with fluent speech, no focal motor/sensory deficits, gait normal   LABORATORY DATA: CBC    Component Value Date/Time   WBC 8.6 06/04/2016 0915   RBC 3.80 (L) 06/04/2016 0915   HGB 11.3 (L) 06/04/2016 0915   HCT 34.5 (L) 06/04/2016 0915   PLT 170 06/04/2016 0915   MCV 90.8 06/04/2016 0915   MCH 29.7 06/04/2016 0915   MCHC 32.8 06/04/2016 0915   RDW 13.9 06/04/2016 0915   LYMPHSABS 2.8 06/04/2016 0915   MONOABS 0.4 06/04/2016 0915   EOSABS 0.1 06/04/2016 0915   BASOSABS 0.0 06/04/2016 0915      Chemistry      Component Value Date/Time   NA 139 06/04/2016 0915   K 3.4 (L)  06/04/2016 0915   CL 106 06/04/2016 0915   CO2 27 06/04/2016 0915   BUN 15 06/04/2016 0915   CREATININE 0.79 06/04/2016 0915   CREATININE 0.72 05/02/2014 0733      Component Value Date/Time   CALCIUM 9.5 06/04/2016 0915   ALKPHOS 81 12/05/2015 0818   AST 22 12/05/2015 0818   ALT 15 12/05/2015 0818   BILITOT 0.3 12/05/2015 0818        PENDING LABS:   RADIOGRAPHIC STUDIES:  No results found.   PATHOLOGY:    ASSESSMENT AND PLAN:  Anemia Anemia, previously on p.o. iron replacement with ferrous sulfate with improved hemoglobin but with persistent nausea.  Ferrous sulfate was discontinued in November 2016 with improvement in nausea.  Now on IV iron when indicated.  She is status post EGD on 10/24/2015 with Dr. Gala Romney which demonstrated normal esophagus, diffuse erythematous mucosa in the gastric body, and narrowed pyloric channel.  The pylorus was injected in 4 quadrants with 25 units of Botox to each quadrant.    Labs today: CBC diff, renal function panel, iron/TIBC, ferritin, folate, B12.  I personally reviewed and  went over laboratory results with the patient.  The results are noted within this dictation.    Minimal anemia is noted.  Anemia studies are pending at this time.  Labs in 6 months: CBC diff, BMET, iron/TIBC, ferritin Labs in 12 months: CBC diff, BMET, anemia panel.  I personally reviewed and went over radiographic studies with the patient.  The results are noted within this dictation.  I personally reviewed the images in PACS.  Mammogram and [place] in April 2018 are reviewed and BIRADS 2.   She is to follow-up with dermatology regarding her rash.  Return in 12 months for follow-up, sooner if needed.  Addendum: Ferritin is less than 100.  Calculated iron deficit is 380 mg.  Orders placed for 1 dose of feraheme 510 mg.  Nicotine dependence Quit smoking on 01/20/2016.  Ongoing cessation is encouraged.   ORDERS PLACED FOR THIS ENCOUNTER: No orders of the defined types were placed in this encounter.   MEDICATIONS PRESCRIBED THIS ENCOUNTER: No orders of the defined types were placed in this encounter.   THERAPY PLAN:  Continue to monitor blood work and vitamins.  All questions were answered. The patient knows to call the clinic with any problems, questions or concerns. We can certainly see the patient much sooner if necessary.  Patient and plan discussed with Dr. Twana First and she is in agreement with the aforementioned.   This note is electronically signed by: Doy Mince 06/04/2016 2:23 PM

## 2016-06-04 NOTE — Assessment & Plan Note (Signed)
Quit smoking on 01/20/2016.  Ongoing cessation is encouraged.

## 2016-06-04 NOTE — Patient Instructions (Signed)
[place] at [facility] Discharge Instructions  RECOMMENDATIONS MADE BY THE CONSULTANT AND ANY TEST RESULTS WILL BE SENT TO YOUR REFERRING PHYSICIAN.  You were seen today by Kirby Crigler PA-C. Follow up with your dermatologist about your rash. Return in 6 months for lasbs. Return in 12 months for labs and follow up.   Thank you for choosing [place] at [facility] to provide your oncology and hematology care.  To afford each patient quality time with our provider, please arrive at least 15 minutes before your scheduled appointment time.    If you have a lab appointment with the [facility] please come in thru the  Main Entrance and check in at the main information desk  You need to re-schedule your appointment should you arrive 10 or more minutes late.  We strive to give you quality time with our providers, and arriving late affects you and other patients whose appointments are after yours.  Also, if you no show three or more times for appointments you may be dismissed from the clinic at the providers discretion.     Again, thank you for choosing [facility].  Our hope is that these requests will decrease the amount of time that you wait before being seen by our physicians.       _____________________________________________________________  Should you have questions after your visit to [facility], please contact our office at [phone] between the hours of 8:30 a.m. and 4:30 p.m.  Voicemails left after 4:30 p.m. will not be returned until the following business day.  For prescription refill requests, have your pharmacy contact our office.       Resources For Cancer Patients and their Caregivers ? American Cancer Society: Can assist with transportation, wigs, general needs, runs Look Good Feel Better.        716 337 6796 ? Cancer Care: Provides financial assistance, online support groups,  medication/co-pay assistance.  1-800-813-HOPE (407)459-3268) ? [place] Assists [place] cancer patients and their families through emotional , educational and financial support.  867-563-4718 ? [place] DSS Where to apply for food stamps, Medicaid and utility assistance. 251-616-0327 ? [facility]: Transportation to medical appointments. (208)313-3602 ? Social Security Administration: May apply for disability if have a Stage IV cancer. (704)383-2579 431-322-9611 ? [employer], Disability and Transit Services: Assists with nutrition, care and transit needs. [place] Support Programs: @10RELATIVEDAYS @ > Cancer Support Group  2nd Tuesday of the month 1pm-2pm, Journey Room  > Creative Journey  3rd Tuesday of the month 1130am-1pm, Journey Room  > Look Good Feel Better  1st Wednesday of the month 10am-12 noon, Journey Room (Call [place] to register [phone])

## 2016-06-04 NOTE — Addendum Note (Signed)
Addended by: Baird [facility] on: 06/04/2016 02:23 PM   Modules accepted: Orders

## 2016-06-04 NOTE — Progress Notes (Signed)
Subjective:   Tracey Daniel is a 36 y.o. female who presents for Medicare Annual (Subsequent) preventive examination.  Review of Systems: Cardiac Risk Factors include: dyslipidemia     Objective:     Vitals: BP 118/70   Pulse (!) 54   Ht 5\' 6"  (1.676 m)   Wt 146 lb 0.6 oz (66.2 kg)   SpO2 99%   BMI 23.57 kg/m   Body mass index is 23.57 kg/m.   Tobacco History  Smoking Status  . Former Smoker  . Packs/day: 0.10  . Years: 15.00  . Types: Cigarettes  . Quit date: 01/20/2016  Smokeless Tobacco  . Never Used     Counseling given: Not Answered   Past Medical History:  Diagnosis Date  . Anemia of other chronic disease 11/29/2012  . Asthma   . Back pain, chronic   . Chronic abdominal pain   . Constipation   . COPD (chronic obstructive pulmonary disease) ([place])   . Cyst of skin    mid spine area  . Depression 2000   h/o suicidal ideation  . Gastric outlet obstruction   . Gastritis   . Gastroparesis   . Migraine   . Migraines   . Nausea and vomiting    recurrent  . Nicotine addiction   . Osteoporosis   . Peptic ulcer disease   . Pyloric spasm 03/30/2011  . Seasonal allergies   . Sinusitis   . Substance abuse 2008   marijuana  . Vitamin D deficiency    Past Surgical History:  Procedure Laterality Date  . BALLOON DILATION N/A 02/09/2013   Procedure: BALLOON DILATION;  Surgeon: Missy Sabins, MD;  Location: [facility];  Service: Endoscopy;  Laterality: N/A;  . BOTOX INJECTION N/A 02/09/2013   Procedure: BOTOX INJECTION;  Surgeon: Missy Sabins, MD;  Location: [facility];  Service: Endoscopy;  Laterality: N/A;  possible balloon  . BOTOX INJECTION N/A 10/24/2015   Procedure: BOTOX INJECTION;  Surgeon: Daneil Dolin, MD;  Location: [facility];  Service: Endoscopy;  Laterality: N/A;  . CARPAL TUNNEL RELEASE     left hand  . CHOLECYSTECTOMY     ?2002  . COLONOSCOPY WITH PROPOFOL N/A 09/20/2014   RMR: Normal ileo-colonoscopy  . ESOPHAGEAL DILATION   12/25/2010   Procedure: ESOPHAGEAL DILATION;  Surgeon: Dorothyann Peng, MD;  [facility];  Service: Endoscopy;;  . ESOPHAGOGASTRODUODENOSCOPY  12/25/2010   Dorothyann Peng, MD;  moderate gastritis, ?goo secondary to pylorspasm. BX showed reactive gstropathy no h.pyori, SB mucosa with intramucosal lymphocytosis and partial villous blunting (TTG 4.0 normal)  . ESOPHAGOGASTRODUODENOSCOPY N/A 11/14/2012   BSW:HQPRF hiatal hernia. Abnormal gastric mucosa of  uncertain significance-status post biopsy. Subjectively, patient may have recurrent symptomatic, pylorospasm.  . ESOPHAGOGASTRODUODENOSCOPY (EGD) WITH PROPOFOL N/A 02/09/2013   elongated stomach, partial lower esophageal ring widely patent. No obvious pyloric stenosis s/p Botox  . ESOPHAGOGASTRODUODENOSCOPY (EGD) WITH PROPOFOL N/A 10/24/2015   Procedure: ESOPHAGOGASTRODUODENOSCOPY (EGD) WITH PROPOFOL;  Surgeon: Daneil Dolin, MD;  [facility];  Service: Endoscopy;  Laterality: N/A;  830   . laser surgery on cervix    . TOE SURGERY     Family History  Problem Relation Age of Onset  . Diabetes Father   . Liver disease Father        liver transplant at [facility], age 11  . Lung cancer Mother 58  . Heart disease Maternal Grandmother   . Parkinson's disease Maternal Grandfather   .  Multiple sclerosis Sister 100  . Depression Sister 7  . Alcohol abuse Sister   . Bipolar disorder Sister   . Schizophrenia Sister   . Colon cancer Neg Hx    History  Sexual Activity  . Sexual activity: Yes  . Birth control/ protection: None    Outpatient Encounter Prescriptions as of 06/04/2016  Medication Sig  . albuterol (PROVENTIL HFA;VENTOLIN HFA) 108 (90 Base) MCG/ACT inhaler Inhale 2 puffs into the lungs every 6 (six) hours as needed. Shortness of breath  . azelastine (ASTELIN) 0.1 % nasal spray Place 2 sprays into both nostrils 2 (two) times daily. Use in each nostril as directed  . BIOTIN PO Take 1 tablet by mouth daily.  .  calcium-vitamin D (OSCAL WITH D) 500-200 MG-UNIT per tablet Take 1 tablet by mouth 2 (two) times daily.  . chlorhexidine (PERIDEX) 0.12 % solution 5 mLs by Mouth Rinse route daily.  . clobetasol cream (TEMOVATE) 7.82 % Apply 1 application topically 2 (two) times daily as needed (rash). Apply to rash  . dicyclomine (BENTYL) 20 MG tablet Take 1 tablet (20 mg total) by mouth 2 (two) times daily.  . DULoxetine (CYMBALTA) 30 MG capsule Take 1 capsule (30 mg total) by mouth daily.  . ergocalciferol (VITAMIN D2) 50000 UNITS capsule Take 1 capsule (50,000 Units total) by mouth once a week. One capsule once weekly (Patient taking differently: Take 50,000 Units by mouth every Friday. One capsule once weekly)  . estradiol (ESTRACE) 1 MG tablet Take 1 tablet (1 mg total) by mouth daily.  Marland Kitchen EVENING PRIMROSE OIL PO Take by mouth 2 (two) times daily.  . feeding supplement (ENSURE CLINICAL STRENGTH) LIQD Take 237 mLs by mouth 3 (three) times daily with meals. (Patient taking differently: Take 237 mLs by mouth 2 (two) times daily. )  . fluticasone (FLONASE) 50 MCG/ACT nasal spray USE 2 SPRAYS IN EACH NOSTRIL DAILY  . gabapentin (NEURONTIN) 100 MG capsule Take 100 mg by mouth 3 (three) times daily.  Marland Kitchen LINZESS 290 MCG CAPS capsule TAKE 1 CAPSULE(290 MCG) BY MOUTH DAILY  . loratadine (CLARITIN) 10 MG tablet Take 1 tablet (10 mg total) by mouth daily.  . metoCLOPramide (REGLAN) 10 MG tablet Take 1 tablet (10 mg total) by mouth every 6 (six) hours. (Patient taking differently: Take 10 mg by mouth 2 (two) times daily. )  . NUCYNTA 50 MG TABS tablet Take 50 mg by mouth every 8 (eight) hours as needed for moderate pain. Reported on 05/08/2015  . ondansetron (ZOFRAN-ODT) 4 MG disintegrating tablet DISSOLVE 1 TABLET IN MOUTH EVERY 8 HOURS AS NEEDED FOR NAUSEA  . pantoprazole (PROTONIX) 40 MG tablet Take 1 tablet (40 mg total) by mouth 2 (two) times daily before a meal.  . potassium chloride SA (K-DUR,KLOR-CON) 20 MEQ tablet  Take 1 tablet (20 mEq total) by mouth daily.  . pravastatin (PRAVACHOL) 20 MG tablet TAKE 1 TABLET(20 MG) BY MOUTH DAILY  . PREMARIN vaginal cream Place 1 Applicatorful vaginally. Twice a week  . progesterone (PROMETRIUM) 200 MG capsule TAKE 1 CAPSULE BY MOUTH EVERY DAY. EVERY THIRD MONTH, JANUARY, APRIL, JULY, AND OCTOBER FOR 2 WEEKS (Patient taking differently: TAKES 1 CAP DAILY FOR 2 WEEKS EVERY THIRD MONTH, [place], APRIL, [place], AND October)  . sucralfate (CARAFATE) 1 g tablet TAKE 1 TABLET BY MOUTH EVERY MORNING AND 1 TABLET AT BEDTIME AS NEEDED FOR STOMACH BURNING( CRUSH 1 TABLET AND MIX IN APPLESAUCE)  . SYMBICORT 80-4.5 MCG/ACT inhaler INHALE 2 PUFFS INTO THE  LUNGS TWICE DAILY  . tiZANidine (ZANAFLEX) 4 MG tablet Take 4 mg by mouth 3 (three) times daily as needed for muscle spasms.  Marland Kitchen zonisamide (ZONEGRAN) 25 MG capsule 50 mg 3 (three) times daily.   . [DISCONTINUED] doxycycline (VIBRAMYCIN) 100 MG capsule Take 1 capsule (100 mg total) by mouth 2 (two) times daily.  . [DISCONTINUED] sucralfate (CARAFATE) 1 g tablet TAKE 1 TABLET BY MOUTH EVERY MORNING AND 1 TABLET AT BEDTIME AS NEEDED FOR STOMACH BURNING( CRUSH 1 TABLET AND MIX IN APPLESAUCE)   No facility-administered encounter medications on file as of 06/04/2016.     Activities of Daily Living In your present state of health, do you have any difficulty performing the following activities: 06/04/2016 09/25/2015  Hearing? Y N  Vision? N N  Difficulty concentrating or making decisions? N N  Walking or climbing stairs? N N  Dressing or bathing? N N  Doing errands, shopping? N N  Preparing Food and eating ? N -  Using the Toilet? N -  In the past six months, have you accidently leaked urine? N -  Do you have problems with loss of bowel control? N -  Managing your Medications? N -  Managing your Finances? N -  Housekeeping or managing your Housekeeping? N -  Some recent data might be hidden    Patient Care Team: [name],  MD as PCP - General Rourk, Cristopher Estimable, MD (Gastroenterology) Whitney Muse Kelby Fam, MD as Consulting Physician (Hematology and Oncology) Scherrie November, MD as Referring Physician (Internal Medicine)    Assessment:    Exercise Activities and Dietary recommendations Current Exercise Habits: Home exercise routine, Time (Minutes): 30, Frequency (Times/Week): 2, Weekly Exercise (Minutes/Week): 60, Intensity: Mild, Exercise limited by: None identified  Goals    . Exercise 3x per week (30 min per time)          Recommend increasing your exercise program at least 3 days a week for 30-45 minutes at a time as tolerated.        Fall Risk Fall Risk  06/04/2016 02/26/2015 12/05/2013 11/14/2012 11/14/2012  Falls in the past year? No No No No No   Depression Screen PHQ 2/9 Scores 06/04/2016 01/01/2016 09/18/2014 12/05/2013  PHQ - 2 Score 3 3 6  0  PHQ- 9 Score 4 13 17  -     Cognitive Function: Normal   6CIT Screen 06/04/2016  What Year? 0 points  What month? 0 points  What time? 0 points  Count back from 20 0 points  Months in reverse 0 points  Repeat phrase 0 points  Total Score 0    Immunization History  Administered Date(s) Administered  . Influenza Split 10/30/2010, 10/09/2011  . Influenza,inj,Quad PF,36+ Mos 11/14/2012, 12/13/2013, 09/18/2014, 10/02/2015  . Pneumococcal Polysaccharide-23 08/21/2010  . Td 06/20/2001  . Tdap 08/21/2010   Screening Tests Health Maintenance  Topic Date Due  . INFLUENZA VACCINE  08/19/2016  . PAP SMEAR  08/07/2017  . TETANUS/TDAP  08/20/2020  . HIV Screening  Completed      Plan:    I have personally reviewed and noted the following in the patient's chart:   . Medical and social history . Use of alcohol, tobacco or illicit drugs  . Current medications and supplements . Functional ability and status . Nutritional status . Physical activity . Advanced directives . List of other physicians . Hospitalizations, surgeries, and ER visits in previous  12 months . Vitals . Screenings to include cognitive, depression, and falls .  Referrals and appointments: Referral to Dr. Benjamine Mola for hearing screen.  In addition, I have reviewed and discussed with patient certain preventive protocols, quality metrics, and best practice recommendations. A written personalized care plan for preventive services as well as general preventive health recommendations were provided to patient.     Stormy Fabian, LPN  06/25/3708

## 2016-06-04 NOTE — Patient Instructions (Signed)
Tracey Daniel , Thank you for taking time to come for your Medicare Wellness Visit. I appreciate your ongoing commitment to your health goals. Please review the following plan we discussed and let me know if I can assist you in the future.   Screening recommendations/referrals: Colonoscopy: Up to date, next due 09/2024 Mammogram: Up to date, next due 04/2017 Bone Density: Up to date Recommended yearly ophthalmology/optometry visit for glaucoma screening and checkup Recommended yearly dental visit for hygiene and checkup  Vaccinations: Influenza vaccine: Up to date, next due 08/2016 Pneumococcal vaccine: Up to date, due at age 71 Tdap vaccine: Up to date, next due 08/2020 Shingles vaccine: Due at age 45    Advanced directives: Advance directive discussed with you today. I have provided a copy for you to complete at home and have notarized. Once this is complete please bring a copy in to our office so we can scan it into your chart.  Next appointment: Follow up with Dr. Moshe Cipro next month. Follow up in 1 year for your annual wellness visit.  Preventive Care 18-39 Years, Female Preventive care refers to lifestyle choices and visits with your health care provider that can promote health and wellness. What does preventive care include?  A yearly physical exam. This is also called an annual well check.  Dental exams once or twice a year.  Routine eye exams. Ask your health care provider how often you should have your eyes checked.  Personal lifestyle choices, including:  Daily care of your teeth and gums.  Regular physical activity.  Eating a healthy diet.  Avoiding tobacco and drug use.  Limiting alcohol use.  Practicing safe sex.  Taking vitamin and mineral supplements as recommended by your health care provider. What happens during an annual well check? The services and screenings done by your health care provider during your annual well check will depend on your age, overall  health, lifestyle risk factors, and family history of disease. Counseling  Your health care provider may ask you questions about your:  Alcohol use.  Tobacco use.  Drug use.  Emotional well-being.  Home and relationship well-being.  Sexual activity.  Eating habits.  Work and work Statistician.  Method of birth control.  Menstrual cycle.  Pregnancy history. Screening  You may have the following tests or measurements:  Height, weight, and BMI.  Diabetes screening. This is done by checking your blood sugar (glucose) after you have not eaten for a while (fasting).  Blood pressure.  Lipid and cholesterol levels. These may be checked every 5 years starting at age 25.  Skin check.  Hepatitis C blood test.  Hepatitis B blood test.  Sexually transmitted disease (STD) testing.  BRCA-related cancer screening. This may be done if you have a family history of breast, ovarian, tubal, or peritoneal cancers.  Pelvic exam and Pap test. This may be done every 3 years starting at age 29. Starting at age 17, this may be done every 5 years if you have a Pap test in combination with an HPV test. Discuss your test results, treatment options, and if necessary, the need for more tests with your health care provider. Vaccines  Your health care provider may recommend certain vaccines, such as:  Influenza vaccine. This is recommended every year.  Tetanus, diphtheria, and acellular pertussis (Tdap, Td) vaccine. You may need a Td booster every 10 years.  Varicella vaccine. You may need this if you have not been vaccinated.  HPV vaccine. If you are 26 or  younger, you may need three doses over 6 months.  Measles, mumps, and rubella (MMR) vaccine. You may need at least one dose of MMR. You may also need a second dose.  Pneumococcal 13-valent conjugate (PCV13) vaccine. You may need this if you have certain conditions and were not previously vaccinated.  Pneumococcal polysaccharide  (PPSV23) vaccine. You may need one or two doses if you smoke cigarettes or if you have certain conditions.  Meningococcal vaccine. One dose is recommended if you are age 28-21 years and a first-year college student living in a residence hall, or if you have one of several medical conditions. You may also need additional booster doses.  Hepatitis A vaccine. You may need this if you have certain conditions or if you travel or work in places where you may be exposed to hepatitis A.  Hepatitis B vaccine. You may need this if you have certain conditions or if you travel or work in places where you may be exposed to hepatitis B.  Haemophilus influenzae type b (Hib) vaccine. You may need this if you have certain risk factors. Talk to your health care provider about which screenings and vaccines you need and how often you need them. This information is not intended to replace advice given to you by your health care provider. Make sure you discuss any questions you have with your health care provider. Document Released: 03/03/2001 Document Revised: 09/25/2015 Document Reviewed: 11/06/2014 Elsevier Interactive Patient Education  2017 [employer].

## 2016-06-04 NOTE — Assessment & Plan Note (Addendum)
Anemia, previously on p.o. iron replacement with ferrous sulfate with improved hemoglobin but with persistent nausea.  Ferrous sulfate was discontinued in November 2016 with improvement in nausea.  Now on IV iron when indicated.  She is status post EGD on 10/24/2015 with Dr. Gala Romney which demonstrated normal esophagus, diffuse erythematous mucosa in the gastric body, and narrowed pyloric channel.  The pylorus was injected in 4 quadrants with 25 units of Botox to each quadrant.    Labs today: CBC diff, renal function panel, iron/TIBC, ferritin, folate, B12.  I personally reviewed and went over laboratory results with the patient.  The results are noted within this dictation.    Minimal anemia is noted.  Anemia studies are pending at this time.  Labs in 6 months: CBC diff, BMET, iron/TIBC, ferritin Labs in 12 months: CBC diff, BMET, anemia panel.  I personally reviewed and went over radiographic studies with the patient.  The results are noted within this dictation.  I personally reviewed the images in PACS.  Mammogram and [place] in April 2018 are reviewed and BIRADS 2.   She is to follow-up with dermatology regarding her rash.  Return in 12 months for follow-up, sooner if needed.  Addendum: Ferritin is less than 100.  Calculated iron deficit is 380 mg.  Orders placed for 1 dose of feraheme 510 mg.

## 2016-06-09 ENCOUNTER — Encounter (HOSPITAL_BASED_OUTPATIENT_CLINIC_OR_DEPARTMENT_OTHER): Payer: Medicare HMO

## 2016-06-09 ENCOUNTER — Encounter (HOSPITAL_COMMUNITY): Payer: Self-pay

## 2016-06-09 VITALS — BP 123/81 | HR 64 | Temp 98.3°F | Resp 18

## 2016-06-09 DIAGNOSIS — D500 Iron deficiency anemia secondary to blood loss (chronic): Secondary | ICD-10-CM

## 2016-06-09 DIAGNOSIS — D509 Iron deficiency anemia, unspecified: Secondary | ICD-10-CM

## 2016-06-09 MED ORDER — SODIUM CHLORIDE 0.9 % IV SOLN
Freq: Once | INTRAVENOUS | Status: AC
  Administered 2016-06-09: 14:00:00 via INTRAVENOUS

## 2016-06-09 MED ORDER — SODIUM CHLORIDE 0.9 % IV SOLN
510.0000 mg | Freq: Once | INTRAVENOUS | Status: AC
  Administered 2016-06-09: 510 mg via INTRAVENOUS
  Filled 2016-06-09: qty 17

## 2016-06-09 NOTE — Progress Notes (Signed)
Tolerated infusion w/o adverse reaction.  Alert, in no distress.  VSS.  Discharged ambulatory.  

## 2016-06-09 NOTE — Patient Instructions (Signed)
[facility] Discharge Instructions  RECOMMENDATIONS MADE BY THE CONSULTANT AND ANY TEST RESULTS WILL BE SENT TO YOUR REFERRING PHYSICIAN.  Iron infusion today. Return as scheduled.   Thank you for choosing [facility] to provide your oncology and hematology care.  To afford each patient quality time with our provider, please arrive at least 15 minutes before your scheduled appointment time.    If you have a lab appointment with the Cancer Center please come in thru the  Main Entrance and check in at the main information desk  You need to re-schedule your appointment should you arrive 10 or more minutes late.  We strive to give you quality time with our providers, and arriving late affects you and other patients whose appointments are after yours.  Also, if you no show three or more times for appointments you may be dismissed from the clinic at the providers discretion.     Again, thank you for choosing [facility].  Our hope is that these requests will decrease the amount of time that you wait before being seen by our physicians.       _____________________________________________________________  Should you have questions after your visit to [facility], please contact our office at [phone] between the hours of 8:30 a.m. and 4:30 p.m.  Voicemails left after 4:30 p.m. will not be returned until the following business day.  For prescription refill requests, have your pharmacy contact our office.       Resources For Cancer Patients and their Caregivers ? American Cancer Society: Can assist with transportation, wigs, general needs, runs Look Good Feel Better.        1-888-227-6333 ? Cancer Care: Provides financial assistance, online support groups, medication/co-pay assistance.  1-800-813-HOPE (4673) ? [facility] Assists [place] cancer patients and their  families through emotional , educational and financial support.  336-427-4357 ? [place] DSS Where to apply for food stamps, Medicaid and utility assistance. 336-342-1394 ? [facility]: Transportation to medical appointments. 336-347-2287 ? Social Security Administration: May apply for disability if have a Stage IV cancer. 336-342-7796 1-800-772-1213 ? [place] Aging, Disability and Transit Services: Assists with nutrition, care and transit needs. 336-349-2343  Cancer Center Support Programs: @10RELATIVEDAYS@ > Cancer Support Group  2nd Tuesday of the month 1pm-2pm, Journey Room  > Creative Journey  3rd Tuesday of the month 1130am-1pm, Journey Room  > Look Good Feel Better  1st Wednesday of the month 10am-12 noon, Journey Room (Call American Cancer Society to register 1-800-395-5775)   

## 2016-06-18 ENCOUNTER — Other Ambulatory Visit: Payer: Self-pay | Admitting: Nurse Practitioner

## 2016-06-24 NOTE — Telephone Encounter (Signed)
Dr. Moshe Cipro would you like this refilled?

## 2016-06-25 MED ORDER — ERGOCALCIFEROL 1.25 MG (50000 UT) PO CAPS: 50000.0000 [IU] | ORAL_CAPSULE | ORAL | 1 refills | Status: DC

## 2016-06-30 ENCOUNTER — Encounter: Payer: Self-pay | Admitting: Nurse Practitioner

## 2016-06-30 ENCOUNTER — Ambulatory Visit: Payer: Medicare HMO | Admitting: Nurse Practitioner

## 2016-06-30 ENCOUNTER — Ambulatory Visit (INDEPENDENT_AMBULATORY_CARE_PROVIDER_SITE_OTHER): Payer: Medicare HMO | Admitting: Nurse Practitioner

## 2016-06-30 VITALS — BP 114/64 | HR 57 | Temp 97.3°F | Ht 66.0 in | Wt 146.4 lb

## 2016-06-30 DIAGNOSIS — R079 Chest pain, unspecified: Secondary | ICD-10-CM | POA: Diagnosis not present

## 2016-06-30 DIAGNOSIS — K219 Gastro-esophageal reflux disease without esophagitis: Secondary | ICD-10-CM | POA: Diagnosis not present

## 2016-06-30 DIAGNOSIS — R112 Nausea with vomiting, unspecified: Secondary | ICD-10-CM

## 2016-06-30 DIAGNOSIS — K5909 Other constipation: Secondary | ICD-10-CM | POA: Diagnosis not present

## 2016-06-30 NOTE — Progress Notes (Signed)
Referring Provider: [name], MD Primary Care Physician:  [name], MD Primary GI:  Dr. Gala Romney  Chief Complaint  Patient presents with  . Gastroesophageal Reflux    HPI:   Tracey Daniel is a 36 y.o. female who presents for follow-up on GERD and gastroparesis. The patient was last seen in our office 12/31/2015 for constipation, gastroparesis, and pyloric spasm. Chronic history of known pylorospasm producing partial functional gastric outlet obstruction and she has been on Reglan and had off label Botox injection to the pyloric channel which has been successful. She has been on Linzess 290 g for constipation, Protonix daily, Reglan.   Last EGD completed 10/24/2015 with Botox injection. Findings included normal appearing esophageal mucosa, some retained gastric contents, small hiatal hernia, diffuse erythematous mucosa throughout the gastric body, narrowed pyloric channel requiring pressure application to admit the scope, normal duodenum. The pylorus was injected in 4 quadrants with 25 units of Botox each quadrant. Recommended resuming gastroparesis diet, continue medications, follow-up in 8 weeks.  At her last visit she was doing better after EGD and Botox but complained of poor appetite due to migraine medication started a month and a half prior. Noted weight loss of 8 pounds in 2-1/2 months. GERD somewhat well managed but requesting refill of Carafate. Nausea improved. Constipation well managed. She titrates her Linzess. No other GI symptoms. Zofran was refilled, recommended discuss migraine medicine and decreased appetite with the prescriber, Protonix and Carafate refills provided. Recommend follow-up in 6 months. She has not call with any complaints or concerns since then.  Today she states she's doing well overall. GERD symptoms doing well overall, rare breakthrough. Is having "squeezing" in her RUQ and through to her back. This is constant for the past 5 days,  stretches hasn't helped, tried removing her bra for a time which hasn't helped. Gets worse after eating. Denies hematochezia, melena, fever, chills, unintentional weight loss. Constipation overall pretty well controlled, uses Linzess 290 which she manages the dosing and only occasionally has breakthrough constipation. Her gallbladder has been removed. She has cut way back on juices and soda, drinks mostly water. She had some chest pain a couple days ago, has not notified anyone of this. Denies dyspnea, dizziness, lightheadedness, syncope, near syncope. Denies any other upper or lower GI symptoms.  Past Medical History:  Diagnosis Date  . Anemia of other chronic disease 11/29/2012  . Asthma   . Back pain, chronic   . Chronic abdominal pain   . Constipation   . COPD (chronic obstructive pulmonary disease) ([place])   . Cyst of skin    mid spine area  . Depression 2000   h/o suicidal ideation  . Gastric outlet obstruction   . Gastritis   . Gastroparesis   . Migraine   . Migraines   . Nausea and vomiting    recurrent  . Nicotine addiction   . Osteoporosis   . Peptic ulcer disease   . Pyloric spasm 03/30/2011  . Seasonal allergies   . Sinusitis   . Substance abuse 2008   marijuana  . Vitamin D deficiency     Past Surgical History:  Procedure Laterality Date  . BALLOON DILATION N/A 02/09/2013   Procedure: BALLOON DILATION;  Surgeon: Missy Sabins, MD;  Location: [facility];  Service: Endoscopy;  Laterality: N/A;  . BOTOX INJECTION N/A 02/09/2013   Procedure: BOTOX INJECTION;  Surgeon: Missy Sabins, MD;  Location: [facility];  Service: Endoscopy;  Laterality: N/A;  possible balloon  . BOTOX INJECTION N/A 10/24/2015   Procedure: BOTOX INJECTION;  Surgeon: Daneil Dolin, MD;  Location: [facility];  Service: Endoscopy;  Laterality: N/A;  . CARPAL TUNNEL RELEASE     left hand  . CHOLECYSTECTOMY     ?2002  . COLONOSCOPY WITH PROPOFOL N/A 09/20/2014   RMR: Normal ileo-colonoscopy  .  ESOPHAGEAL DILATION  12/25/2010   Procedure: ESOPHAGEAL DILATION;  Surgeon: Dorothyann Peng, MD;  Location: [facility];  Service: Endoscopy;;  . ESOPHAGOGASTRODUODENOSCOPY  12/25/2010   Dorothyann Peng, MD;  moderate gastritis, ?goo secondary to pylorspasm. BX showed reactive gstropathy no h.pyori, SB mucosa with intramucosal lymphocytosis and partial villous blunting (TTG 4.0 normal)  . ESOPHAGOGASTRODUODENOSCOPY N/A 11/14/2012   POE:UMPNT hiatal hernia. Abnormal gastric mucosa of  uncertain significance-status post biopsy. Subjectively, patient may have recurrent symptomatic, pylorospasm.  . ESOPHAGOGASTRODUODENOSCOPY (EGD) WITH PROPOFOL N/A 02/09/2013   elongated stomach, partial lower esophageal ring widely patent. No obvious pyloric stenosis s/p Botox  . ESOPHAGOGASTRODUODENOSCOPY (EGD) WITH PROPOFOL N/A 10/24/2015   Procedure: ESOPHAGOGASTRODUODENOSCOPY (EGD) WITH PROPOFOL;  Surgeon: Daneil Dolin, MD;  Location: [facility];  Service: Endoscopy;  Laterality: N/A;  830   . laser surgery on cervix    . TOE SURGERY      Current Outpatient Prescriptions  Medication Sig Dispense Refill  . albuterol (PROVENTIL HFA;VENTOLIN HFA) 108 (90 Base) MCG/ACT inhaler Inhale 2 puffs into the lungs every 6 (six) hours as needed. Shortness of breath 6.7 g 4  . azelastine (ASTELIN) 0.1 % nasal spray Place 2 sprays into both nostrils 2 (two) times daily. Use in each nostril as directed 30 mL 12  . BIOTIN PO Take 1 tablet by mouth daily.    . calcium-vitamin D (OSCAL WITH D) 500-200 MG-UNIT per tablet Take 1 tablet by mouth 2 (two) times daily.    . chlorhexidine (PERIDEX) 0.12 % solution 5 mLs by Mouth Rinse route daily.  2  . clobetasol cream (TEMOVATE) 6.14 % Apply 1 application topically 2 (two) times daily as needed (rash). Apply to rash    . dicyclomine (BENTYL) 20 MG tablet Take 1 tablet (20 mg total) by mouth 2 (two) times daily. 20 tablet 0  . DULoxetine (CYMBALTA) 30 MG capsule Take 1 capsule (30  mg total) by mouth daily. 30 capsule 3  . ergocalciferol (VITAMIN D2) 50000 units capsule Take 1 capsule (50,000 Units total) by mouth once a week. One capsule once weekly 12 capsule 1  . estradiol (ESTRACE) 1 MG tablet Take 1 tablet (1 mg total) by mouth daily. 30 tablet 11  . EVENING PRIMROSE OIL PO Take by mouth 2 (two) times daily.    . feeding supplement (ENSURE CLINICAL STRENGTH) LIQD Take 237 mLs by mouth 3 (three) times daily with meals. (Patient taking differently: Take 237 mLs by mouth 2 (two) times daily. ) 10 Bottle 3  . fluticasone (FLONASE) 50 MCG/ACT nasal spray USE 2 SPRAYS IN EACH NOSTRIL DAILY 48 g 1  . gabapentin (NEURONTIN) 100 MG capsule Take 100 mg by mouth 3 (three) times daily.    Marland Kitchen LINZESS 290 MCG CAPS capsule TAKE 1 CAPSULE(290 MCG) BY MOUTH DAILY 30 capsule 0  . loratadine (CLARITIN) 10 MG tablet Take 1 tablet (10 mg total) by mouth daily. 30 tablet 5  . metoCLOPramide (REGLAN) 10 MG tablet Take 1 tablet (10 mg total) by mouth every 6 (six) hours. (Patient taking differently: Take 10 mg by mouth 2 (two)  times daily. ) 30 tablet 1  . NUCYNTA 50 MG TABS tablet Take 50 mg by mouth every 8 (eight) hours as needed for moderate pain. Reported on 05/08/2015  0  . ondansetron (ZOFRAN-ODT) 4 MG disintegrating tablet DISSOLVE 1 TABLET IN MOUTH EVERY 8 HOURS AS NEEDED FOR NAUSEA 30 tablet 0  . pantoprazole (PROTONIX) 40 MG tablet Take 1 tablet (40 mg total) by mouth 2 (two) times daily before a meal. 180 tablet 3  . potassium chloride SA (K-DUR,KLOR-CON) 20 MEQ tablet Take 1 tablet (20 mEq total) by mouth daily. 30 tablet 3  . pravastatin (PRAVACHOL) 20 MG tablet TAKE 1 TABLET(20 MG) BY MOUTH DAILY 90 tablet 1  . PREMARIN vaginal cream Place 1 Applicatorful vaginally. Twice a week    . progesterone (PROMETRIUM) 200 MG capsule TAKE 1 CAPSULE BY MOUTH EVERY DAY. EVERY THIRD MONTH, JANUARY, APRIL, JULY, AND OCTOBER FOR 2 WEEKS (Patient taking differently: TAKES 1 CAP DAILY FOR 2 WEEKS  EVERY THIRD MONTH, [place], APRIL, [place], AND October) 14 capsule 3  . sucralfate (CARAFATE) 1 g tablet TAKE 1 TABLET BY MOUTH EVERY MORNING AND 1 TABLET AT BEDTIME AS NEEDED FOR STOMACH BURNING( CRUSH 1 TABLET AND MIX IN APPLESAUCE) 180 tablet 3  . SYMBICORT 80-4.5 MCG/ACT inhaler INHALE 2 PUFFS INTO THE LUNGS TWICE DAILY 10.2 g 5  . tiZANidine (ZANAFLEX) 4 MG tablet Take 4 mg by mouth 3 (three) times daily as needed for muscle spasms.    Marland Kitchen zonisamide (ZONEGRAN) 25 MG capsule 50 mg 3 (three) times daily.      No current facility-administered medications for this visit.     Allergies as of 06/30/2016 - Review Complete 06/30/2016  Allergen Reaction Noted  . Benadryl [diphenhydramine] Anaphylaxis 03/25/2014  . Latex Itching and Other (See Comments) 11/30/2011  . Penicillins Other (See Comments)     Family History  Problem Relation Age of Onset  . Diabetes Father   . Liver disease Father        liver transplant at [facility], age 44  . Lung cancer Mother 57  . Heart disease Maternal Grandmother   . Parkinson's disease Maternal Grandfather   . Multiple sclerosis Sister 40  . Depression Sister 21  . Alcohol abuse Sister   . Bipolar disorder Sister   . Schizophrenia Sister   . Colon cancer Neg Hx     Social History   Social History  . Marital status: Single    Spouse name: N/A  . Number of children: 0  . Years of education: N/A   Occupational History  . disability Unemployed   Social History Main Topics  . Smoking status: Former Smoker    Packs/day: 0.10    Years: 15.00    Types: Cigarettes    Quit date: 01/20/2016  . Smokeless tobacco: Never Used  . Alcohol use No  . Drug use: No  . Sexual activity: Yes    Birth control/ protection: None   Other Topics Concern  . None   Social History Narrative  . None    Review of Systems: General: Negative for anorexia, weight loss, fever, chills, fatigue, weakness. ENT: Negative for hoarseness, difficulty swallowing. CV: Negative  for chest pain, angina, palpitations, peripheral edema.  Respiratory: Negative for dyspnea at rest, cough, sputum, wheezing.  GI: See history of present illness. Endo: Negative for unusual weight change.  Heme: Negative for bruising or bleeding.  Physical Exam: BP 114/64   Pulse (!) 57   Temp 97.3 F (36.3  C) (Oral)   Ht 5\' 6"  (1.676 m)   Wt 146 lb 6.4 oz (66.4 kg)   BMI 23.63 kg/m  General:   Alert and oriented. Pleasant and cooperative. Well-nourished and well-developed.  Eyes:  Without icterus, sclera clear and conjunctiva pink.  Ears:  Normal auditory acuity. Cardiovascular:  S1, S2 present without murmurs appreciated. Extremities without clubbing or edema. Respiratory:  Clear to auscultation bilaterally. No wheezes, rales, or rhonchi. No distress.  Gastrointestinal:  +BS, soft, non-tender and non-distended. No HSM noted. No guarding or rebound. No masses appreciated.  Rectal:  Deferred  Musculoskalatal:  Symmetrical without gross deformities. Neurologic:  Alert and oriented x4;  grossly normal neurologically. Psych:  Alert and cooperative. Normal mood and affect. Heme/Lymph/Immune: No excessive bruising noted.    06/30/2016 8:52 AM   Disclaimer: This note was dictated with voice recognition software. Similar sounding words can inadvertently be transcribed and may not be corrected upon review.

## 2016-06-30 NOTE — Assessment & Plan Note (Signed)
She has epigastric to right upper quadrant pain, described as a squeezing pain. She did have an episode of chest pain a while back and I recommended she notify her primary care this. She has not tried Carafate to see if this is breakthrough GERD symptoms. Overall her gastroparesis, pylorospasm, GERD are very well controlled on PPI. Recommend she continue her PPI therapy, attempt Carafate to see if this helps when she has squeezing epigastric/right upper quadrant pain as a possible manifestation of breakthrough GERD. If this does not work she is to notify us and we can trial viscous lidocaine solution. She does not have a gallbladder. I will also check a CBC, CMP, lipase. Return for follow-up in 6 months. Call if any worsening or severe symptoms.

## 2016-06-30 NOTE — Patient Instructions (Signed)
1. Have your labs drawn when you're able to. 2. Continue taking Linzess. 3. Continue taking your acid blocker 4. Try using Carafate for the squeezing pain you're experiencing. Let us know if this works. If not, we can try other medications. 5. Return for follow-up in 6 months. 6. Call us if you have any severe or worsening symptoms.

## 2016-06-30 NOTE — Progress Notes (Signed)
CC'ED TO PCP 

## 2016-06-30 NOTE — Assessment & Plan Note (Signed)
No ongoing nausea and vomiting. Gastroparesis likely well controlled. Return for follow-up in 6 months.

## 2016-06-30 NOTE — Assessment & Plan Note (Addendum)
The patient described an episode of chest pain a couple weeks ago which worried her. She did not go to the emergency room nor has she notified her primary care. No active chest pain currently. I discussed ER precautions related to chest pain with her. I recommended that she notify her primary care to allow potential further workup. In all likelihood, given minimal to no risk factors, her chest pain is likely a manifestation of GERD breakthrough episode. However I discussed the potential for heart problems thus the recommendation to follow up with primary care. Continue to monitor.

## 2016-06-30 NOTE — Assessment & Plan Note (Signed)
Obstipation generally well controlled. Recommend she continue Linzess which she self titrates at home and overall is very happy with the result. Return for follow-up in 6 months.

## 2016-07-06 DIAGNOSIS — M791 Myalgia: Secondary | ICD-10-CM | POA: Diagnosis not present

## 2016-07-06 DIAGNOSIS — R51 Headache: Secondary | ICD-10-CM | POA: Diagnosis not present

## 2016-07-06 DIAGNOSIS — G43719 Chronic migraine without aura, intractable, without status migrainosus: Secondary | ICD-10-CM | POA: Diagnosis not present

## 2016-07-06 DIAGNOSIS — M542 Cervicalgia: Secondary | ICD-10-CM | POA: Diagnosis not present

## 2016-07-22 ENCOUNTER — Other Ambulatory Visit: Payer: Self-pay | Admitting: Obstetrics and Gynecology

## 2016-07-22 ENCOUNTER — Other Ambulatory Visit: Payer: Self-pay | Admitting: Nurse Practitioner

## 2016-07-23 ENCOUNTER — Encounter: Payer: Self-pay | Admitting: Family Medicine

## 2016-07-23 ENCOUNTER — Ambulatory Visit (INDEPENDENT_AMBULATORY_CARE_PROVIDER_SITE_OTHER): Payer: Medicare HMO | Admitting: Family Medicine

## 2016-07-23 VITALS — BP 124/78 | HR 55 | Temp 100.0°F | Resp 14 | Ht 66.0 in | Wt 145.0 lb

## 2016-07-23 DIAGNOSIS — D500 Iron deficiency anemia secondary to blood loss (chronic): Secondary | ICD-10-CM | POA: Diagnosis not present

## 2016-07-23 DIAGNOSIS — E784 Other hyperlipidemia: Secondary | ICD-10-CM | POA: Diagnosis not present

## 2016-07-23 DIAGNOSIS — F329 Major depressive disorder, single episode, unspecified: Secondary | ICD-10-CM

## 2016-07-23 DIAGNOSIS — R112 Nausea with vomiting, unspecified: Secondary | ICD-10-CM | POA: Diagnosis not present

## 2016-07-23 DIAGNOSIS — E559 Vitamin D deficiency, unspecified: Secondary | ICD-10-CM | POA: Diagnosis not present

## 2016-07-23 DIAGNOSIS — K5909 Other constipation: Secondary | ICD-10-CM | POA: Diagnosis not present

## 2016-07-23 DIAGNOSIS — R110 Nausea: Secondary | ICD-10-CM

## 2016-07-23 DIAGNOSIS — E7849 Other hyperlipidemia: Secondary | ICD-10-CM

## 2016-07-23 DIAGNOSIS — R079 Chest pain, unspecified: Secondary | ICD-10-CM | POA: Diagnosis not present

## 2016-07-23 DIAGNOSIS — J45991 Cough variant asthma: Secondary | ICD-10-CM

## 2016-07-23 DIAGNOSIS — F32A Depression, unspecified: Secondary | ICD-10-CM

## 2016-07-23 DIAGNOSIS — K219 Gastro-esophageal reflux disease without esophagitis: Secondary | ICD-10-CM

## 2016-07-23 LAB — COMPLETE METABOLIC PANEL WITH GFR
ALBUMIN: 4.7 g/dL (ref 3.6–5.1)
ALK PHOS: 78 U/L (ref 33–115)
ALT: 14 U/L (ref 6–29)
AST: 16 U/L (ref 10–30)
BILIRUBIN TOTAL: 0.3 mg/dL (ref 0.2–1.2)
BUN: 12 mg/dL (ref 7–25)
CO2: 26 mmol/L (ref 20–31)
Calcium: 9.6 mg/dL (ref 8.6–10.2)
Chloride: 106 mmol/L (ref 98–110)
Creat: 0.87 mg/dL (ref 0.50–1.10)
GFR, EST NON AFRICAN AMERICAN: 85 mL/min (ref >=60)
Glucose, Bld: 79 mg/dL (ref 65–99)
POTASSIUM: 3.8 mmol/L (ref 3.5–5.3)
SODIUM: 140 mmol/L (ref 135–146)
TOTAL PROTEIN: 7 g/dL (ref 6.1–8.1)

## 2016-07-23 LAB — CBC WITH DIFFERENTIAL/PLATELET
BASOS ABS: 0 {cells}/uL (ref 0–200)
Basophils Relative: 0 %
EOS ABS: 81 {cells}/uL (ref 15–500)
Eosinophils Relative: 1 %
HCT: 36.1 % (ref 35.0–45.0)
HEMOGLOBIN: 11.5 g/dL — AB (ref 11.7–15.5)
LYMPHS ABS: 2916 {cells}/uL (ref 850–3900)
Lymphocytes Relative: 36 %
MCH: 28.9 pg (ref 27.0–33.0)
MCHC: 31.9 g/dL — ABNORMAL LOW (ref 32.0–36.0)
MCV: 90.7 fL (ref 80.0–100.0)
MONO ABS: 567 {cells}/uL (ref 200–950)
MPV: 12.4 fL (ref 7.5–12.5)
Monocytes Relative: 7 %
Neutro Abs: 4536 cells/uL (ref 1500–7800)
Neutrophils Relative %: 56 %
Platelets: 175 10*3/uL (ref 140–400)
RBC: 3.98 MIL/uL (ref 3.80–5.10)
RDW: 13.3 % (ref 11.0–15.0)
WBC: 8.1 10*3/uL (ref 3.8–10.8)

## 2016-07-23 LAB — CBC
HCT: 36.1 % (ref 35.0–45.0)
Hemoglobin: 11.5 g/dL — ABNORMAL LOW (ref 11.7–15.5)
MCH: 28.9 pg (ref 27.0–33.0)
MCHC: 31.9 g/dL — ABNORMAL LOW (ref 32.0–36.0)
MCV: 90.7 fL (ref 80.0–100.0)
MPV: 12.4 fL (ref 7.5–12.5)
PLATELETS: 175 10*3/uL (ref 140–400)
RBC: 3.98 MIL/uL (ref 3.80–5.10)
RDW: 13.3 % (ref 11.0–15.0)
WBC: 8.1 10*3/uL (ref 3.8–10.8)

## 2016-07-23 LAB — LIPID PANEL
CHOL/HDL RATIO: 4.2 ratio (ref <5.0)
Cholesterol: 181 mg/dL (ref <200)
HDL: 43 mg/dL — AB (ref >50)
LDL CALC: 112 mg/dL — AB (ref <100)
Triglycerides: 129 mg/dL (ref <150)
VLDL: 26 mg/dL (ref <30)

## 2016-07-23 LAB — TSH: TSH: 2.4 mIU/L

## 2016-07-23 NOTE — Assessment & Plan Note (Signed)
Controlled, no change in medication  

## 2016-07-23 NOTE — Assessment & Plan Note (Signed)
Controlled , needs to establish with mental health

## 2016-07-23 NOTE — Patient Instructions (Addendum)
F/u in 4 months, call if u you need me before   Lipid, cmp and EGFR, tSH, cBC and vit D today    Please work on good  health habits so that your health will improve. 1. Commitment to daily physical activity for 30 to 60  minutes, if you are able to do this.  2. Commitment to wise food choices. Aim for half of your  food intake to be vegetable and fruit, one quarter starchy foods, and one quarter protein. Try to eat on a regular schedule  3 meals per day, snacking between meals should be limited to vegetables or fruits or small portions of nuts. 64 ounces of water per day is generally recommended, unless you have specific health conditions, like heart failure or kidney failure where you will need to limit fluid intake.  3. Commitment to sufficient and a  good quality of physical and mental rest daily, generally between 6 to 8 hours per day.  WITH PERSISTANCE AND PERSEVERANCE, THE IMPOSSIBLE , BECOMES THE NORM! Thank you  for choosing  Primary Care. We consider it a privelige to serve you.  Delivering excellent health care in a caring and  compassionate way is our goal.  Partnering with you,  so that together we can achieve this goal is our strategy.

## 2016-07-23 NOTE — Assessment & Plan Note (Signed)
Chronic and unchanged, followed by GI

## 2016-07-23 NOTE — Assessment & Plan Note (Signed)
Hyperlipidemia:Low fat diet discussed and encouraged.   Lipid Panel  Lab Results  Component Value Date   CHOL 181 07/23/2016   HDL 43 (L) 07/23/2016   LDLCALC 112 (H) 07/23/2016   TRIG 129 07/23/2016   CHOLHDL 4.2 07/23/2016   Uncontrolled , needs to reduce fat intake and increase exercise

## 2016-07-23 NOTE — Progress Notes (Signed)
   Tracey Daniel     MRN: [id number]      DOB: [birth year]   HPI Tracey Daniel is here for follow up and re-evaluation of chronic medical conditions, medication management and review of any available recent lab and radiology data.  Preventive health is updated, specifically  Cancer screening and Immunization.   Questions or concerns regarding consultations or procedures which the PT has had in the interim are  addressed. The PT denies any adverse reactions to current medications since the last visit.  There are no new concerns.  There are no specific complaints   ROS Denies recent fever or chills. Denies sinus pressure, nasal congestion, ear pain or sore throat. Denies chest congestion, productive cough or wheezing. Denies chest pains, palpitations and leg swelling Denies abdominal pain, nausea, vomiting,diarrhea or constipation.   Denies dysuria, frequency, hesitancy or incontinence. Denies joint pain, swelling and limitation in mobility. Denies headaches, seizures, numbness, or tingling. Denies depression, anxiety or insomnia. Denies skin break down or rash.   PE  BP 124/78 (BP Location: Right Arm, Patient Position: Sitting, Cuff Size: Normal)   Pulse (!) 55   Temp 100 F (37.8 C)   Resp 14   Ht 5\' 6"  (1.676 m)   Wt 145 lb 0.6 oz (65.8 kg)   BMI 23.41 kg/m   Patient alert and oriented and in no cardiopulmonary distress.  HEENT: No facial asymmetry, EOMI,   oropharynx pink and moist.  Neck supple no JVD, no mass.  Chest: Clear to auscultation bilaterally.  CVS: S1, S2 no murmurs, no S3.Regular rate.  ABD: Soft non tender.   Ext: No edema  MS: Adequate ROM spine, shoulders, hips and knees.  Skin: Intact, no ulcerations or rash noted.  Psych: Good eye contact, normal affect. Memory intact not anxious or depressed appearing.  CNS: CN 2-12 intact, power,  normal throughout.no focal deficits noted.   Assessment & Plan  Hyperlipemia Hyperlipidemia:Low fat diet  discussed and encouraged.   Lipid Panel  Lab Results  Component Value Date   CHOL 181 07/23/2016   HDL 43 (L) 07/23/2016   LDLCALC 112 (H) 07/23/2016   TRIG 129 07/23/2016   CHOLHDL 4.2 07/23/2016   Uncontrolled , needs to reduce fat intake and increase exercise    Vitamin D deficiency Updated lab needed    Nausea without vomiting Chronic and unchanged, followed by GI  GERD (gastroesophageal reflux disease) Controlled, no change in medication   Depression Controlled , needs to establish with mental health  Asthma, cough variant Controlled, no change in medication

## 2016-07-23 NOTE — Assessment & Plan Note (Signed)
Updated lab needed.  

## 2016-07-24 LAB — CBC WITH DIFFERENTIAL/PLATELET

## 2016-07-24 LAB — COMPREHENSIVE METABOLIC PANEL
ALT: 14 U/L (ref 6–29)
AST: 16 U/L (ref 10–30)
Albumin: 4.7 g/dL (ref 3.6–5.1)
Alkaline Phosphatase: 79 U/L (ref 33–115)
BILIRUBIN TOTAL: 0.3 mg/dL (ref 0.2–1.2)
BUN: 12 mg/dL (ref 7–25)
CALCIUM: 9.8 mg/dL (ref 8.6–10.2)
CO2: 26 mmol/L (ref 20–31)
CREATININE: 0.84 mg/dL (ref 0.50–1.10)
Chloride: 107 mmol/L (ref 98–110)
GLUCOSE: 80 mg/dL (ref 65–99)
Potassium: 3.7 mmol/L (ref 3.5–5.3)
SODIUM: 141 mmol/L (ref 135–146)
Total Protein: 7.1 g/dL (ref 6.1–8.1)

## 2016-07-24 LAB — LIPASE: Lipase: 15 U/L (ref 7–60)

## 2016-07-24 NOTE — Telephone Encounter (Signed)
refil premarin VC

## 2016-07-30 LAB — VITAMIN D 1,25 DIHYDROXY
Vitamin D 1, 25 (OH)2 Total: 82 pg/mL — ABNORMAL HIGH (ref 18–72)
Vitamin D2 1, 25 (OH)2: 45 pg/mL
Vitamin D3 1, 25 (OH)2: 37 pg/mL

## 2016-07-31 ENCOUNTER — Other Ambulatory Visit: Payer: Self-pay | Admitting: Family Medicine

## 2016-08-03 ENCOUNTER — Encounter: Payer: Self-pay | Admitting: Family Medicine

## 2016-08-08 ENCOUNTER — Other Ambulatory Visit: Payer: Self-pay | Admitting: Family Medicine

## 2016-09-28 DIAGNOSIS — G43719 Chronic migraine without aura, intractable, without status migrainosus: Secondary | ICD-10-CM | POA: Diagnosis not present

## 2016-09-28 DIAGNOSIS — R51 Headache: Secondary | ICD-10-CM | POA: Diagnosis not present

## 2016-09-28 DIAGNOSIS — M542 Cervicalgia: Secondary | ICD-10-CM | POA: Diagnosis not present

## 2016-09-28 DIAGNOSIS — M791 Myalgia: Secondary | ICD-10-CM | POA: Diagnosis not present

## 2016-10-08 DIAGNOSIS — G5600 Carpal tunnel syndrome, unspecified upper limb: Secondary | ICD-10-CM | POA: Diagnosis not present

## 2016-10-08 DIAGNOSIS — G629 Polyneuropathy, unspecified: Secondary | ICD-10-CM | POA: Diagnosis not present

## 2016-10-16 ENCOUNTER — Other Ambulatory Visit: Payer: Self-pay | Admitting: Obstetrics and Gynecology

## 2016-10-21 DIAGNOSIS — F333 Major depressive disorder, recurrent, severe with psychotic symptoms: Secondary | ICD-10-CM | POA: Diagnosis not present

## 2016-10-28 DIAGNOSIS — F411 Generalized anxiety disorder: Secondary | ICD-10-CM | POA: Diagnosis not present

## 2016-10-28 DIAGNOSIS — F333 Major depressive disorder, recurrent, severe with psychotic symptoms: Secondary | ICD-10-CM | POA: Diagnosis not present

## 2016-11-02 ENCOUNTER — Other Ambulatory Visit: Payer: Self-pay | Admitting: Obstetrics and Gynecology

## 2016-11-02 ENCOUNTER — Other Ambulatory Visit: Payer: Self-pay | Admitting: Gastroenterology

## 2016-11-02 DIAGNOSIS — K219 Gastro-esophageal reflux disease without esophagitis: Secondary | ICD-10-CM

## 2016-11-02 DIAGNOSIS — K3184 Gastroparesis: Secondary | ICD-10-CM

## 2016-11-02 DIAGNOSIS — K5909 Other constipation: Secondary | ICD-10-CM

## 2016-11-02 DIAGNOSIS — K313 Pylorospasm, not elsewhere classified: Secondary | ICD-10-CM

## 2016-11-03 ENCOUNTER — Other Ambulatory Visit: Payer: Self-pay | Admitting: Obstetrics and Gynecology

## 2016-11-03 DIAGNOSIS — E288 Other ovarian dysfunction: Principal | ICD-10-CM

## 2016-11-03 DIAGNOSIS — E2839 Other primary ovarian failure: Secondary | ICD-10-CM

## 2016-11-03 MED ORDER — ESTRADIOL 1 MG PO TABS: 1.0000 mg | ORAL_TABLET | Freq: Every day | ORAL | 11 refills | Status: DC

## 2016-11-03 NOTE — Progress Notes (Unsigned)
Estradiol refilled, will send recall

## 2016-11-05 ENCOUNTER — Telehealth: Payer: Self-pay | Admitting: Family Medicine

## 2016-11-05 ENCOUNTER — Other Ambulatory Visit: Payer: Self-pay | Admitting: *Deleted

## 2016-11-05 NOTE — Telephone Encounter (Signed)
Patient called in she is having aching all over, legs are so sore she cant walk, soreness in her chest, and cold sweats. This is her 3rd day being sick Please advise. Cb#: [phone]

## 2016-11-05 NOTE — Telephone Encounter (Signed)
Pls offer appt with any of the Docs in the office who mAY be availabale if they are, if not, then advise h er to go to urgent care

## 2016-11-06 ENCOUNTER — Telehealth: Payer: Self-pay | Admitting: Family Medicine

## 2016-11-06 NOTE — Telephone Encounter (Signed)
Left message @2 :31 11-06-16  for patient to call back re appt or going to urgent care.  She has made an appt for Monday

## 2016-11-07 ENCOUNTER — Other Ambulatory Visit: Payer: Self-pay | Admitting: Family Medicine

## 2016-11-08 ENCOUNTER — Other Ambulatory Visit: Payer: Self-pay | Admitting: Obstetrics and Gynecology

## 2016-11-08 MED ORDER — PROGESTERONE MICRONIZED 200 MG PO CAPS: ORAL_CAPSULE | ORAL | 3 refills | Status: DC

## 2016-11-08 NOTE — Telephone Encounter (Signed)
refil prometrium x 2 weeks q 3 months x 1 yr.

## 2016-11-09 ENCOUNTER — Encounter: Payer: Self-pay | Admitting: Family Medicine

## 2016-11-09 ENCOUNTER — Ambulatory Visit (INDEPENDENT_AMBULATORY_CARE_PROVIDER_SITE_OTHER): Payer: Medicare HMO | Admitting: Family Medicine

## 2016-11-09 VITALS — BP 126/82 | HR 72 | Temp 98.0°F | Resp 16 | Ht 66.0 in | Wt 131.0 lb

## 2016-11-09 DIAGNOSIS — Z23 Encounter for immunization: Secondary | ICD-10-CM

## 2016-11-09 DIAGNOSIS — J45991 Cough variant asthma: Secondary | ICD-10-CM

## 2016-11-09 MED ORDER — AZITHROMYCIN 250 MG PO TABS: ORAL_TABLET | ORAL | 0 refills | Status: DC

## 2016-11-09 MED ORDER — PREDNISONE 20 MG PO TABS: 20.0000 mg | ORAL_TABLET | Freq: Two times a day (BID) | ORAL | 0 refills | Status: DC

## 2016-11-09 NOTE — Patient Instructions (Signed)
Rest push fluids Take medicine as prescribed Come back next week for your flu shot

## 2016-11-09 NOTE — Progress Notes (Signed)
Chief Complaint  Patient presents with  . URI   Patient has had a cough for a week.  She is fatigued.  She is coughing up yellow sputum.  She is more short of breath.  She has been using her albuterol inhaler.  No chest pain.  No runny nose or sinus congestion.  She has pain in her left cheek from trauma.  No ear pressure or pain.  No sore throat or swallowing difficulty.  Patient Active Problem List   Diagnosis Date Noted  . Chest pain 06/30/2016  . Hyperlipemia 10/02/2015  . Premature ovarian failure 02/04/2015  . Pregnancy as incidental finding, low positive preg test, considered pituitary production of HCG 02/04/2015  . Abdominal pain, epigastric 08/27/2014  . Vitamin D deficiency 05/16/2014  . Alopecia 08/01/2013  . Asthma, cough variant 12/01/2012  . 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 Marijuana use 11/14/2012  . Seasonal allergies 11/14/2012  . Nausea without vomiting 11/09/2012  . Constipation 12/02/2011  . Gastroparesis 05/01/2011  . Pyloric spasm 03/30/2011  . Hip pain, right 03/02/2011  . Anemia 01/30/2011  . Gastric outlet obstruction 01/01/2011  . GERD (gastroesophageal reflux disease) 08/21/2010  . Headache 03/13/2008  . AMENORRHEA, SECONDARY 09/27/2007  . ECZEMA 09/27/2007  . Depression 07/01/2007  . BACK PAIN, CHRONIC 07/01/2007  . CLOSED FRACTURE OF METATARSAL BONE 01/27/2007    Outpatient Encounter Prescriptions as of 11/09/2016  Medication Sig  . azelastine (ASTELIN) 0.1 % nasal spray Place 2 sprays into both nostrils 2 (two) times daily. Use in each nostril as directed  . BIOTIN PO Take 1 tablet by mouth daily.  . calcium-vitamin D (OSCAL WITH D) 500-200 MG-UNIT per tablet Take 1 tablet by mouth 2 (two) times daily.  . chlorhexidine (PERIDEX) 0.12 % solution 5 mLs by Mouth Rinse route daily.  . clobetasol cream (TEMOVATE) 4.26 % Apply 1 application topically 2 (two) times daily as needed (rash). Apply to rash  .  dicyclomine (BENTYL) 20 MG tablet Take 1 tablet (20 mg total) by mouth 2 (two) times daily.  . DULoxetine (CYMBALTA) 30 MG capsule Take 1 capsule (30 mg total) by mouth daily.  Marland Kitchen estradiol (ESTRACE) 1 MG tablet Take 1 tablet (1 mg total) by mouth daily.  Marland Kitchen EVENING PRIMROSE OIL PO Take by mouth 2 (two) times daily.  . feeding supplement (ENSURE CLINICAL STRENGTH) LIQD Take 237 mLs by mouth 3 (three) times daily with meals. (Patient taking differently: Take 237 mLs by mouth 2 (two) times daily. )  . fluticasone (FLONASE) 50 MCG/ACT nasal spray USE 2 SPRAYS IN EACH NOSTRIL DAILY  . gabapentin (NEURONTIN) 100 MG capsule Take 100 mg by mouth 3 (three) times daily.  Marland Kitchen LINZESS 290 MCG CAPS capsule TAKE 1 CAPSULE(290 MCG) BY MOUTH DAILY  . loratadine (CLARITIN) 10 MG tablet Take 1 tablet (10 mg total) by mouth daily.  . metoCLOPramide (REGLAN) 10 MG tablet Take 1 tablet (10 mg total) by mouth every 6 (six) hours. (Patient taking differently: Take 10 mg by mouth 2 (two) times daily. )  . ondansetron (ZOFRAN-ODT) 4 MG disintegrating tablet DISSOLVE 1 TABLET ON THE TONGUE EVERY 8 HOURS AS NEEDED FOR NAUSEA  . pantoprazole (PROTONIX) 40 MG tablet Take 1 tablet (40 mg total) by mouth 2 (two) times daily before a meal.  . potassium chloride SA (K-DUR,KLOR-CON) 20 MEQ tablet Take 1 tablet (20 mEq total) by mouth daily.  . pravastatin (PRAVACHOL) 20 MG tablet TAKE 1 TABLET(20 MG) BY MOUTH  DAILY  . PREMARIN vaginal cream INSERT 9.56 APPLICATORFUL VAGINALLY THREE TIMES WEEKLY AS DIRECTED  . progesterone (PROMETRIUM) 200 MG capsule TAKE 1 CAPSULE BY MOUTH EVERY DAY. EVERY THIRD MONTH, JANUARY, APRIL, JULY, AND OCTOBER FOR 2 WEEKS  . sucralfate (CARAFATE) 1 g tablet TAKE 1 TABLET BY MOUTH EVERY MORNING AND 1 TABLET AT BEDTIME AS NEEDED FOR STOMACH BURNING( CRUSH 1 TABLET AND MIX IN APPLESAUCE)  . SYMBICORT 80-4.5 MCG/ACT inhaler INHALE 2 PUFFS INTO THE LUNGS TWICE DAILY  . tiZANidine (ZANAFLEX) 4 MG tablet Take 4 mg  by mouth 3 (three) times daily as needed for muscle spasms.  . VENTOLIN HFA 108 (90 Base) MCG/ACT inhaler INHALE 2 PUFFS INTO THE LUNGS EVERY 6 HOURS AS NEEDED FOR SHORTNESS OF BREATH  . zonisamide (ZONEGRAN) 25 MG capsule 50 mg 3 (three) times daily.   . [DISCONTINUED] VENTOLIN HFA 108 (90 Base) MCG/ACT inhaler INHALE 2 PUFFS INTO THE LUNGS EVERY 6 HOURS AS NEEDED FOR SHORTNESS OF BREATH  . azithromycin (ZITHROMAX) 250 MG tablet Two tabs today, then one a day until gone  . predniSONE (DELTASONE) 20 MG tablet Take 1 tablet (20 mg total) by mouth 2 (two) times daily with a meal.   No facility-administered encounter medications on file as of 11/09/2016.     Allergies  Allergen Reactions  . Benadryl [Diphenhydramine] Anaphylaxis  . Latex Itching and Other (See Comments)    "burning" where the tape goes  . Penicillins Other (See Comments)    Unknown Has patient had a PCN reaction causing immediate rash, facial/tongue/throat swelling, SOB or lightheadedness with hypotension Yes Has patient had a PCN reaction causing severe rash involving mucus membranes or skin necrosis: Yes-hives  Has patient had a PCN reaction that required hospitalization Yes Has patient had a PCN reaction occurring within the last 10 years: Yes If all of the above answers are "NO", then may proceed with Cephalosporin use.     Review of Systems  Constitutional: Positive for activity change, appetite change and fatigue. Negative for diaphoresis and fever.  HENT: Negative for postnasal drip, rhinorrhea, sinus pressure, sneezing and sore throat.   Eyes: Negative for redness and visual disturbance.  Respiratory: Positive for cough, shortness of breath and wheezing.   Cardiovascular: Negative for chest pain, palpitations and leg swelling.  Gastrointestinal: Negative for diarrhea, nausea and vomiting.  Genitourinary: Negative for difficulty urinating and frequency.  Musculoskeletal: Negative for arthralgias and back pain.    Neurological: Negative for dizziness and headaches.  Psychiatric/Behavioral: Positive for sleep disturbance.    BP 126/82 (BP Location: Right Arm, Patient Position: Sitting, Cuff Size: Normal)   Pulse 72   Temp 98 F (36.7 C) (Temporal)   Resp 16   Ht 5\' 6"  (1.676 m)   Wt 131 lb 0.6 oz (59.4 kg)   SpO2 100%   BMI 21.15 kg/m   Physical Exam  Constitutional: She appears well-developed and well-nourished. She appears distressed.  Appears tired, and mildly ill  HENT:  Head: Normocephalic and atraumatic.  Right Ear: External ear normal.  Left Ear: External ear normal.  Nose: Nose normal.  Mouth/Throat: Oropharynx is clear and moist.  Left nostril swollen closed  Eyes: Pupils are equal, round, and reactive to light. Conjunctivae are normal.  Neck: Normal range of motion.  Cardiovascular: Normal rate, regular rhythm and normal heart sounds.   Pulmonary/Chest: Effort normal.  Central rhonchi.  Scattered end inspiratory wheezing.  Coughing with attempts at respiration.  Abdominal: Soft. Bowel sounds are normal. There  is tenderness.  Lymphadenopathy:    She has no cervical adenopathy.  Skin: Skin is warm and dry.  Psychiatric: She has a normal mood and affect. Her behavior is normal.    ASSESSMENT/PLAN:  1. Need for influenza vaccination Defer until next week - Flu Vaccine QUAD 36+ mos IM  2. Asthma, cough variant With exacerbation.  Will treat with prednisone and a Z-Pak   Patient Instructions  Rest push fluids Take medicine as prescribed Come back next week for your flu shot   Raylene Everts, MD

## 2016-11-12 ENCOUNTER — Other Ambulatory Visit: Payer: Self-pay | Admitting: Obstetrics and Gynecology

## 2016-11-23 ENCOUNTER — Ambulatory Visit (HOSPITAL_COMMUNITY)
Admission: RE | Admit: 2016-11-23 | Discharge: 2016-11-23 | Disposition: A | Discharge status: Home / Self Care | Payer: Medicare HMO | Source: Ambulatory Visit | Attending: Family Medicine | Admitting: Family Medicine

## 2016-11-23 ENCOUNTER — Encounter: Payer: Self-pay | Admitting: Family Medicine

## 2016-11-23 ENCOUNTER — Ambulatory Visit (INDEPENDENT_AMBULATORY_CARE_PROVIDER_SITE_OTHER): Payer: Medicare HMO | Admitting: Family Medicine

## 2016-11-23 VITALS — BP 124/72 | HR 78 | Resp 16 | Ht 66.0 in | Wt 134.0 lb

## 2016-11-23 DIAGNOSIS — M4185 Other forms of scoliosis, thoracolumbar region: Secondary | ICD-10-CM | POA: Diagnosis not present

## 2016-11-23 DIAGNOSIS — F17218 Nicotine dependence, cigarettes, with other nicotine-induced disorders: Secondary | ICD-10-CM

## 2016-11-23 DIAGNOSIS — M546 Pain in thoracic spine: Secondary | ICD-10-CM

## 2016-11-23 DIAGNOSIS — E559 Vitamin D deficiency, unspecified: Secondary | ICD-10-CM | POA: Diagnosis not present

## 2016-11-23 DIAGNOSIS — J3489 Other specified disorders of nose and nasal sinuses: Secondary | ICD-10-CM | POA: Diagnosis not present

## 2016-11-23 DIAGNOSIS — Z23 Encounter for immunization: Secondary | ICD-10-CM
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
## 2016-11-23 NOTE — Patient Instructions (Addendum)
F/u in end January, call if you need me before  Tracey Daniel get X ray of upper back today  You are referred to ENT re nasal pain and obstruction   Flu vaccine today  You are referred to Dr Luna Glasgow re ankle  Pain x 4 weeks
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
## 2016-11-23 NOTE — Assessment & Plan Note (Addendum)
3 month h/o increased pain needs X ray, X ray reported as negative for injury

## 2016-11-24 DIAGNOSIS — F333 Major depressive disorder, recurrent, severe with psychotic symptoms: Secondary | ICD-10-CM | POA: Diagnosis not present

## 2016-11-30 DIAGNOSIS — F333 Major depressive disorder, recurrent, severe with psychotic symptoms: Secondary | ICD-10-CM | POA: Diagnosis not present

## 2016-12-01 ENCOUNTER — Other Ambulatory Visit: Payer: Self-pay | Admitting: Family Medicine

## 2016-12-06 IMAGING — MR MR LUMBAR SPINE W/O CM
4 of 5 series · 15 of 48 positions shown · non-contrast
Comparison: CT 03/29/2011.

CLINICAL DATA: Lumbago/low back pain radiating to the RIGHT leg.
Chronic increasing RIGHT-sided hip and lumbago/low back pain.

EXAM:
MRI LUMBAR SPINE WITHOUT CONTRAST
TECHNIQUE: Multiplanar, multisequence MR imaging of the lumbar spine was
performed. No intravenous contrast was administered.

[Series 3: T2 · sagittal · 4.0mm · 0.65mm/px · 6 of 15 slices shown (1 of 2)]
[im 1/15]
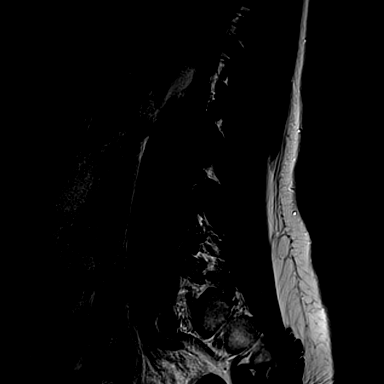
[im 3/15]
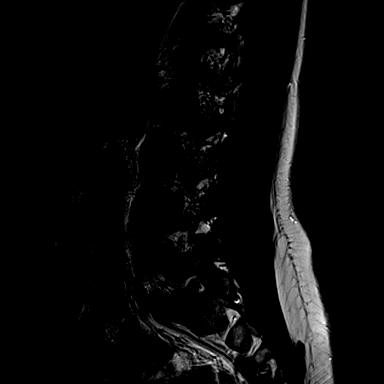
[im 6/15]
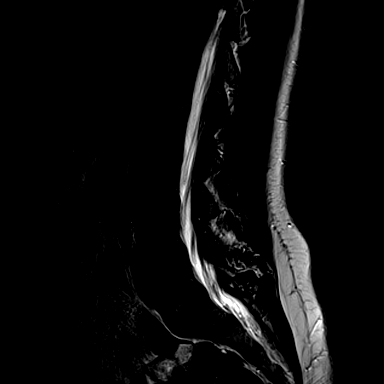
[im 9/15]
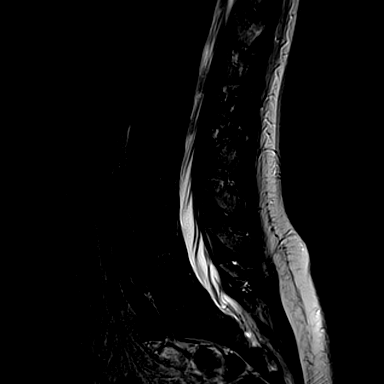
[im 12/15]
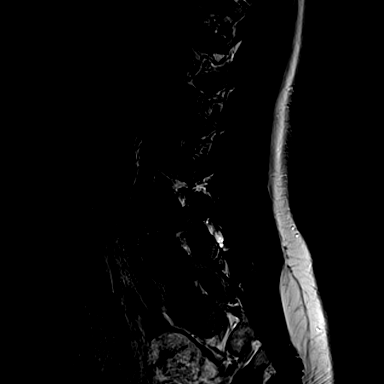
[im 15/15]
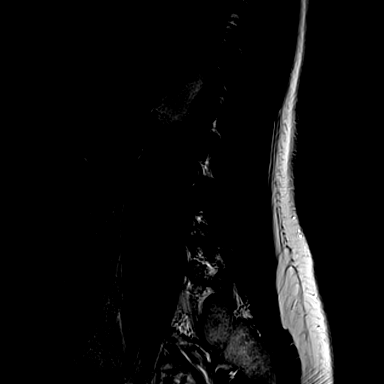

[Series 4: T1 · sagittal · 4.0mm · 0.32mm/px · 3 of 15 slices shown (1 of 2)]
[im 3/15]
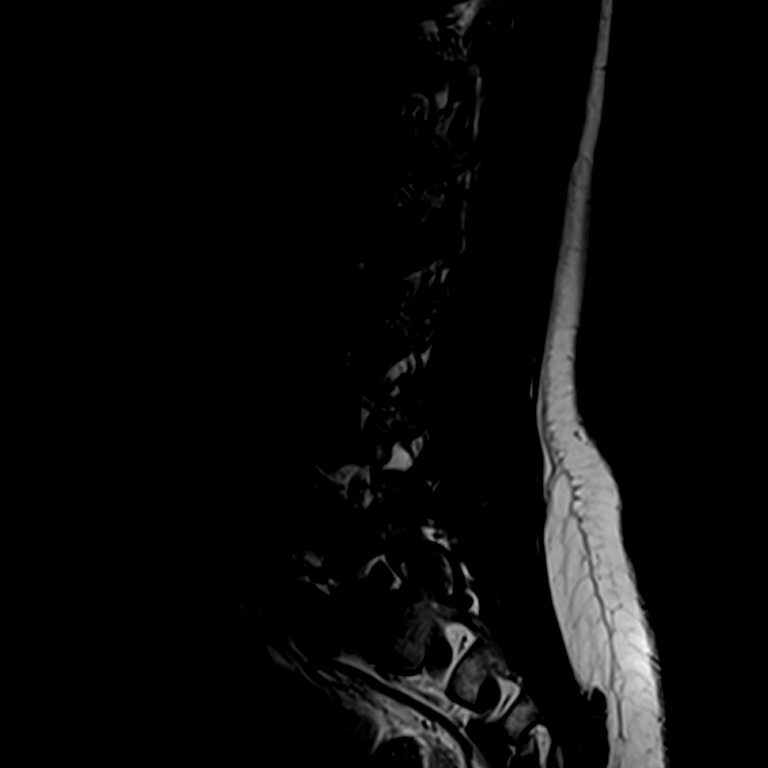
[im 9/15]
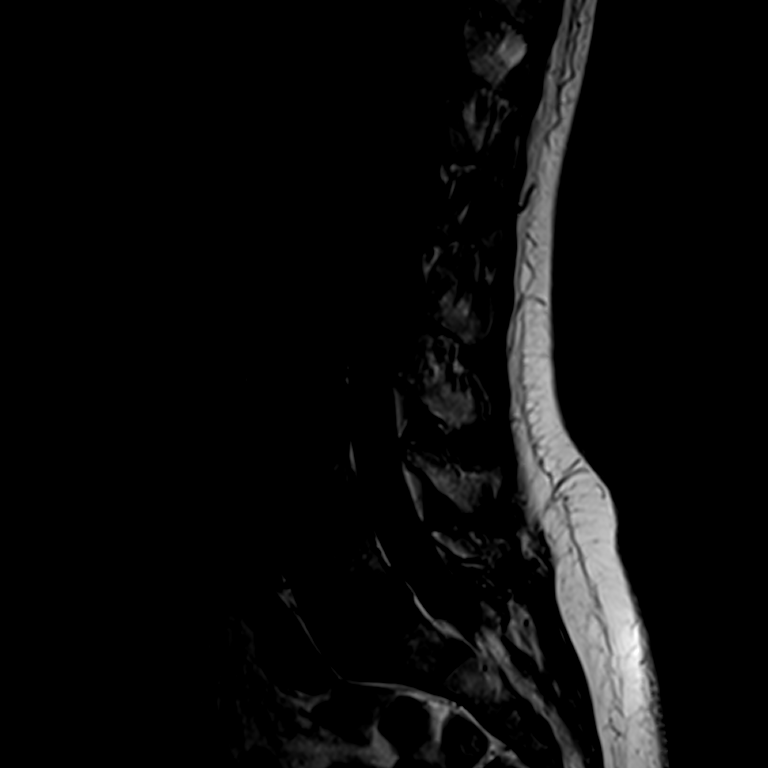
[im 15/15]
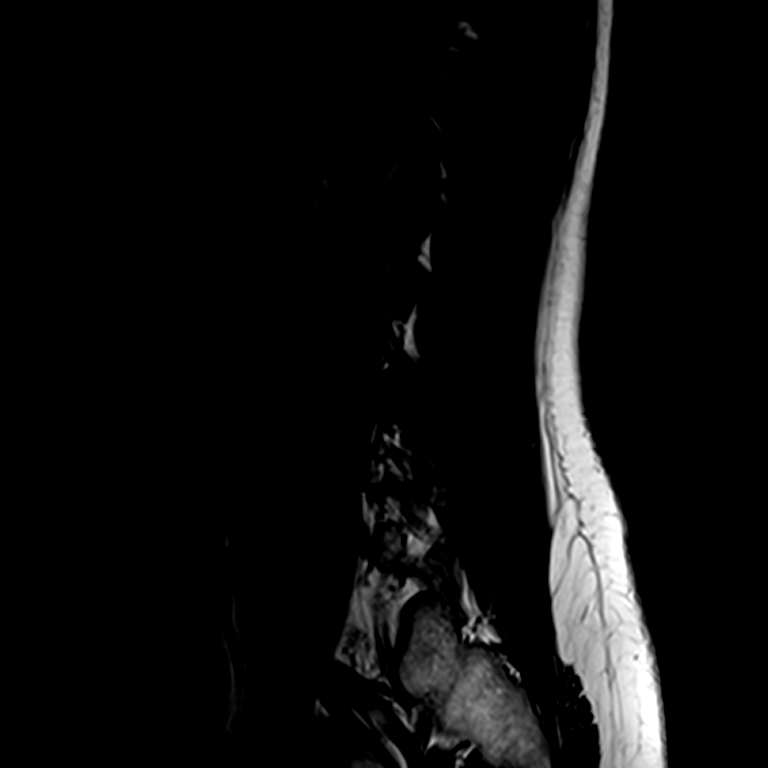

[Series 6: T2 · axial · 4.0mm · 0.22mm/px · z∈[-95,+38]mm · 3 of 36 slices shown (2 of 2)]
[im 6/36]
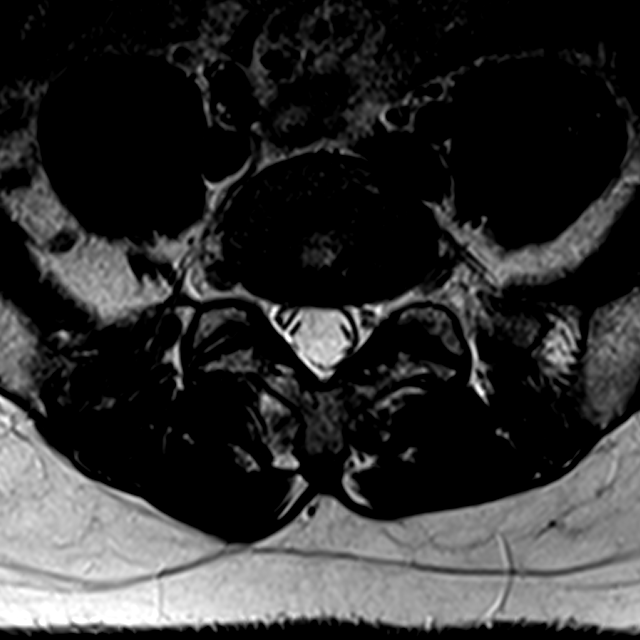
[im 18/36]
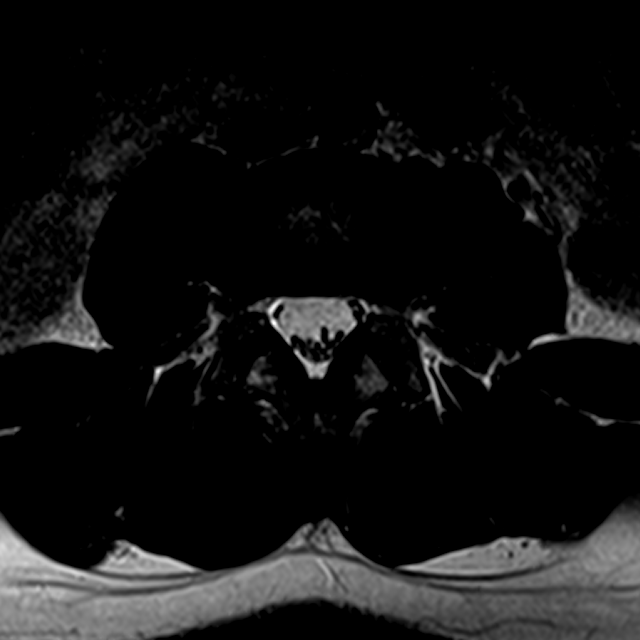
[im 31/36]
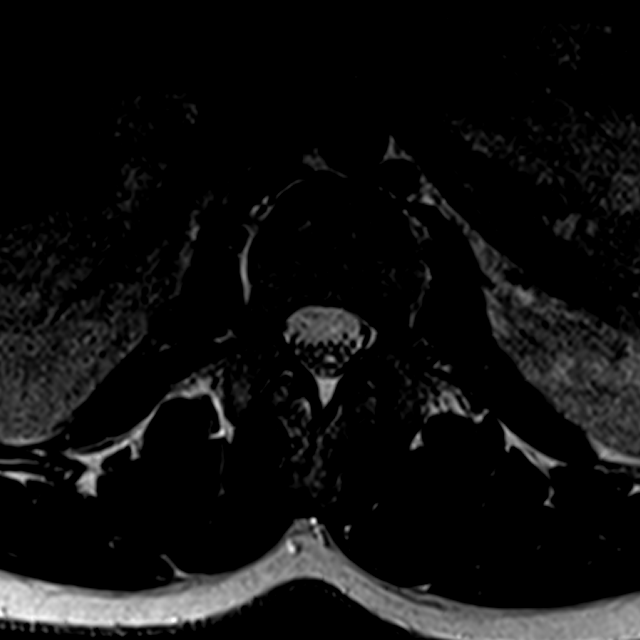

[Series 8: T1 · axial · 4.0mm · 0.21mm/px · z∈[-93,+38]mm · 3 of 36 slices shown (2 of 2)]
[im 6/36]
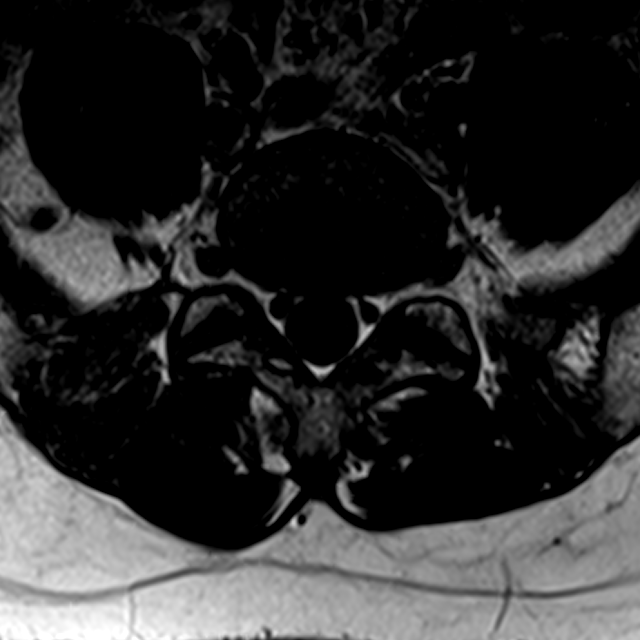
[im 18/36]
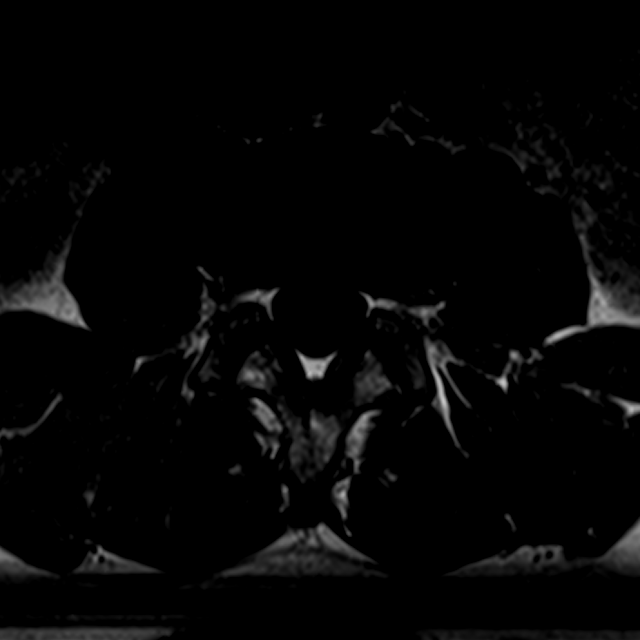
[im 31/36]
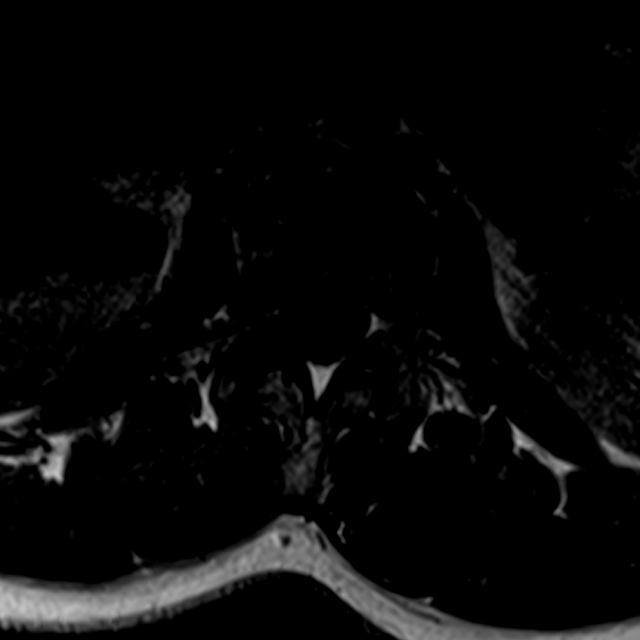

[15 of 48 positions shown; findings below may reference images not displayed]

FINDINGS: Segmentation: The numbering convention used for this exam termed
L5-S1 as the last intervertebral disc space.

Alignment: There is a mild levoconvex curve of the lumbar spine with
the apex at L3.

Vertebrae:  Normal.

Conus medullaris: Normal at L1.

Paraspinal tissues: Normal.

Disc levels:

Disc Signal: Normal.

T11-T12 through L3-L4 intervertebral discs and facet joints are
normal.

L4-L5: The disc is normal. There is no stenosis. There is a RIGHT
posterior extra-spinal synovial cyst measuring 13 mm x 7 mm. Mild
degeneration of the RIGHT facet joint. The LEFT facet joint appears
normal.

L5-S1:  Normal.
IMPRESSION: Single level RIGHT L4-L5 facet degeneration with the posterior
extra-spinal synovial cyst. The synovial cysts arise from facet
joint degeneration. In this patient with RIGHT-sided symptoms,
referred pain associated with RIGHT L4-L5 facet arthritis merits
consideration.

## 2016-12-07 DIAGNOSIS — M791 Myalgia, unspecified site: Secondary | ICD-10-CM | POA: Diagnosis not present

## 2016-12-07 DIAGNOSIS — G43719 Chronic migraine without aura, intractable, without status migrainosus: Secondary | ICD-10-CM | POA: Diagnosis not present

## 2016-12-07 DIAGNOSIS — R51 Headache: Secondary | ICD-10-CM | POA: Diagnosis not present

## 2016-12-07 DIAGNOSIS — M542 Cervicalgia: Secondary | ICD-10-CM | POA: Diagnosis not present

## 2016-12-08 ENCOUNTER — Other Ambulatory Visit (HOSPITAL_COMMUNITY): Payer: Self-pay | Admitting: *Deleted

## 2016-12-08 DIAGNOSIS — D500 Iron deficiency anemia secondary to blood loss (chronic): Secondary | ICD-10-CM

## 2016-12-09 ENCOUNTER — Encounter (HOSPITAL_COMMUNITY): Payer: Medicare HMO | Attending: Oncology

## 2016-12-09 DIAGNOSIS — D500 Iron deficiency anemia secondary to blood loss (chronic): Secondary | ICD-10-CM | POA: Diagnosis not present

## 2016-12-09 LAB — RENAL FUNCTION PANEL
ANION GAP: 8 (ref 5–15)
Albumin: 4.8 g/dL (ref 3.5–5.0)
BUN: 23 mg/dL — ABNORMAL HIGH (ref 6–20)
CHLORIDE: 103 mmol/L (ref 101–111)
CO2: 27 mmol/L (ref 22–32)
Calcium: 9.7 mg/dL (ref 8.9–10.3)
Creatinine, Ser: 0.83 mg/dL (ref 0.44–1.00)
GFR calc non Af Amer: >60 mL/min (ref >60)
Glucose, Bld: 83 mg/dL (ref 65–99)
Phosphorus: 4.1 mg/dL (ref 2.5–4.6)
Potassium: 3.5 mmol/L (ref 3.5–5.1)
Sodium: 138 mmol/L (ref 135–145)

## 2016-12-09 LAB — CBC WITH DIFFERENTIAL/PLATELET
Basophils Absolute: 0 10*3/uL (ref 0.0–0.1)
Basophils Relative: 0 %
EOS PCT: 2 %
Eosinophils Absolute: 0.2 10*3/uL (ref 0.0–0.7)
HCT: 36.8 % (ref 36.0–46.0)
Hemoglobin: 11.9 g/dL — ABNORMAL LOW (ref 12.0–15.0)
LYMPHS ABS: 4.3 10*3/uL — AB (ref 0.7–4.0)
LYMPHS PCT: 41 %
MCH: 29.6 pg (ref 26.0–34.0)
MCHC: 32.3 g/dL (ref 30.0–36.0)
MCV: 91.5 fL (ref 78.0–100.0)
Monocytes Absolute: 1 10*3/uL (ref 0.1–1.0)
Monocytes Relative: 10 %
Neutro Abs: 5 10*3/uL (ref 1.7–7.7)
Neutrophils Relative %: 47 %
PLATELETS: 208 10*3/uL (ref 150–400)
RBC: 4.02 MIL/uL (ref 3.87–5.11)
RDW: 15.1 % (ref 11.5–15.5)
WBC: 10.5 10*3/uL (ref 4.0–10.5)

## 2016-12-09 LAB — IRON AND TIBC
IRON: 62 ug/dL (ref 28–170)
Saturation Ratios: 21 % (ref 10.4–31.8)
TIBC: 301 ug/dL (ref 250–450)
UIBC: 239 ug/dL

## 2016-12-09 LAB — VITAMIN B12: Vitamin B-12: 500 pg/mL (ref 180–914)

## 2016-12-09 LAB — FERRITIN: FERRITIN: 193 ng/mL (ref 11–307)

## 2016-12-09 LAB — FOLATE: FOLATE: 9.3 ng/mL (ref >5.9)

## 2016-12-12 ENCOUNTER — Encounter: Payer: Self-pay | Admitting: Family Medicine

## 2016-12-12 DIAGNOSIS — F17218 Nicotine dependence, cigarettes, with other nicotine-induced disorders: Secondary | ICD-10-CM | POA: Insufficient documentation

## 2016-12-12 NOTE — Assessment & Plan Note (Signed)
Mainatined on supplement

## 2016-12-12 NOTE — Assessment & Plan Note (Signed)

## 2016-12-12 NOTE — Assessment & Plan Note (Signed)
Has resumed treatment through mental health which is appropraite for her needs

## 2016-12-15 NOTE — Telephone Encounter (Signed)
Request delayed in reaching inbasket. Taken care of thru other contacts with pt. 

## 2016-12-15 NOTE — Telephone Encounter (Signed)
refil estrogen

## 2016-12-23 DIAGNOSIS — F333 Major depressive disorder, recurrent, severe with psychotic symptoms: Secondary | ICD-10-CM | POA: Diagnosis not present

## 2016-12-23 DIAGNOSIS — F411 Generalized anxiety disorder: Secondary | ICD-10-CM | POA: Diagnosis not present

## 2016-12-26 ENCOUNTER — Other Ambulatory Visit: Payer: Self-pay | Admitting: Family Medicine

## 2016-12-27 ENCOUNTER — Other Ambulatory Visit: Payer: Self-pay | Admitting: Gastroenterology

## 2016-12-27 DIAGNOSIS — K219 Gastro-esophageal reflux disease without esophagitis: Secondary | ICD-10-CM

## 2016-12-27 DIAGNOSIS — K3184 Gastroparesis: Secondary | ICD-10-CM

## 2016-12-27 DIAGNOSIS — K313 Pylorospasm, not elsewhere classified: Secondary | ICD-10-CM

## 2016-12-27 DIAGNOSIS — K5909 Other constipation: Secondary | ICD-10-CM

## 2016-12-30 ENCOUNTER — Ambulatory Visit: Payer: Medicare HMO | Admitting: Gastroenterology

## 2017-01-05 DIAGNOSIS — J310 Chronic rhinitis: Secondary | ICD-10-CM | POA: Diagnosis not present

## 2017-01-05 DIAGNOSIS — J342 Deviated nasal septum: Secondary | ICD-10-CM | POA: Diagnosis not present

## 2017-01-05 DIAGNOSIS — J343 Hypertrophy of nasal turbinates: Secondary | ICD-10-CM | POA: Diagnosis not present

## 2017-01-10 ENCOUNTER — Other Ambulatory Visit: Payer: Self-pay | Admitting: Family Medicine

## 2017-01-10 DIAGNOSIS — J45909 Unspecified asthma, uncomplicated: Secondary | ICD-10-CM

## 2017-01-13 ENCOUNTER — Other Ambulatory Visit: Payer: Self-pay | Admitting: Nurse Practitioner

## 2017-01-13 DIAGNOSIS — K5909 Other constipation: Secondary | ICD-10-CM

## 2017-01-13 DIAGNOSIS — K219 Gastro-esophageal reflux disease without esophagitis: Secondary | ICD-10-CM

## 2017-01-13 DIAGNOSIS — K3184 Gastroparesis: Secondary | ICD-10-CM

## 2017-01-13 DIAGNOSIS — K313 Pylorospasm, not elsewhere classified: Secondary | ICD-10-CM

## 2017-01-13 NOTE — Telephone Encounter (Signed)
Seen 11 [phone] 

## 2017-01-20 ENCOUNTER — Other Ambulatory Visit: Payer: Self-pay | Admitting: Family Medicine

## 2017-01-21 DIAGNOSIS — F411 Generalized anxiety disorder: Secondary | ICD-10-CM | POA: Diagnosis not present

## 2017-01-21 DIAGNOSIS — F333 Major depressive disorder, recurrent, severe with psychotic symptoms: Secondary | ICD-10-CM | POA: Diagnosis not present

## 2017-01-28 DIAGNOSIS — F411 Generalized anxiety disorder: Secondary | ICD-10-CM | POA: Diagnosis not present

## 2017-01-28 DIAGNOSIS — F333 Major depressive disorder, recurrent, severe with psychotic symptoms: Secondary | ICD-10-CM | POA: Diagnosis not present

## 2017-01-29 DIAGNOSIS — J342 Deviated nasal septum: Secondary | ICD-10-CM | POA: Diagnosis not present

## 2017-01-29 DIAGNOSIS — J343 Hypertrophy of nasal turbinates: Secondary | ICD-10-CM | POA: Diagnosis not present

## 2017-01-29 DIAGNOSIS — J3489 Other specified disorders of nose and nasal sinuses: Secondary | ICD-10-CM | POA: Diagnosis not present

## 2017-01-29 DIAGNOSIS — J352 Hypertrophy of adenoids: Secondary | ICD-10-CM | POA: Diagnosis not present

## 2017-01-29 HISTORY — PX: NASAL SEPTUM SURGERY: SHX37

## 2017-02-03 ENCOUNTER — Other Ambulatory Visit: Payer: Self-pay | Admitting: Obstetrics and Gynecology

## 2017-02-03 NOTE — Telephone Encounter (Signed)
Refilled progestin for q 3 month uses.

## 2017-02-04 ENCOUNTER — Other Ambulatory Visit: Payer: Self-pay | Admitting: Family Medicine

## 2017-02-04 NOTE — Telephone Encounter (Signed)
Seen 11 [phone] 

## 2017-02-14 ENCOUNTER — Other Ambulatory Visit: Payer: Self-pay | Admitting: Family Medicine

## 2017-02-15 ENCOUNTER — Ambulatory Visit: Payer: Medicare HMO | Admitting: Family Medicine

## 2017-02-18 ENCOUNTER — Ambulatory Visit (INDEPENDENT_AMBULATORY_CARE_PROVIDER_SITE_OTHER): Payer: Medicare Other | Admitting: Family Medicine

## 2017-02-18 ENCOUNTER — Encounter: Payer: Self-pay | Admitting: Family Medicine

## 2017-02-18 VITALS — BP 110/70 | HR 60 | Resp 14 | Ht 66.0 in | Wt 145.0 lb

## 2017-02-18 DIAGNOSIS — J302 Other seasonal allergic rhinitis: Secondary | ICD-10-CM | POA: Diagnosis not present

## 2017-02-18 DIAGNOSIS — K219 Gastro-esophageal reflux disease without esophagitis: Secondary | ICD-10-CM | POA: Diagnosis not present

## 2017-02-18 DIAGNOSIS — E7849 Other hyperlipidemia: Secondary | ICD-10-CM

## 2017-02-18 DIAGNOSIS — J45991 Cough variant asthma: Secondary | ICD-10-CM

## 2017-02-18 DIAGNOSIS — E559 Vitamin D deficiency, unspecified: Secondary | ICD-10-CM | POA: Diagnosis not present

## 2017-02-18 DIAGNOSIS — F329 Major depressive disorder, single episode, unspecified: Secondary | ICD-10-CM | POA: Diagnosis not present

## 2017-02-18 DIAGNOSIS — E288 Other ovarian dysfunction: Secondary | ICD-10-CM | POA: Diagnosis not present

## 2017-02-18 DIAGNOSIS — F17210 Nicotine dependence, cigarettes, uncomplicated: Secondary | ICD-10-CM | POA: Diagnosis not present

## 2017-02-18 DIAGNOSIS — Z331 Pregnant state, incidental: Secondary | ICD-10-CM

## 2017-02-18 DIAGNOSIS — D500 Iron deficiency anemia secondary to blood loss (chronic): Secondary | ICD-10-CM

## 2017-02-18 DIAGNOSIS — E2839 Other primary ovarian failure: Secondary | ICD-10-CM

## 2017-02-18 DIAGNOSIS — F32A Depression, unspecified: Secondary | ICD-10-CM

## 2017-02-18 MED ORDER — POTASSIUM CHLORIDE CRYS ER 20 MEQ PO TBCR: 20.0000 meq | EXTENDED_RELEASE_TABLET | Freq: Every day | ORAL | 5 refills | Status: DC

## 2017-02-18 MED ORDER — TIZANIDINE HCL 4 MG PO TABS: ORAL_TABLET | ORAL | 3 refills | Status: DC

## 2017-02-18 NOTE — Patient Instructions (Addendum)
F/u in mid July , call if you need me before  Stop cigarettes tomorrow as planned  Keep appointments as you are doing with mental; health  Fasting lipid,c mp and EGFR, TSH, and Vit D July 5 or  Shortly after

## 2017-02-22 ENCOUNTER — Ambulatory Visit: Payer: Medicare HMO | Admitting: Nurse Practitioner

## 2017-02-22 ENCOUNTER — Encounter: Payer: Self-pay | Admitting: Nurse Practitioner

## 2017-02-22 VITALS — BP 106/58 | HR 74 | Temp 97.5°F | Ht 66.0 in | Wt 147.0 lb

## 2017-02-22 DIAGNOSIS — K219 Gastro-esophageal reflux disease without esophagitis: Secondary | ICD-10-CM

## 2017-02-22 DIAGNOSIS — K649 Unspecified hemorrhoids: Secondary | ICD-10-CM | POA: Diagnosis not present

## 2017-02-22 DIAGNOSIS — K313 Pylorospasm, not elsewhere classified: Secondary | ICD-10-CM | POA: Diagnosis not present

## 2017-02-22 DIAGNOSIS — K5909 Other constipation: Secondary | ICD-10-CM

## 2017-02-22 NOTE — Assessment & Plan Note (Signed)
The patient describes hemorrhoids which are intermittently symptomatic when she is constipated, which is not surprising given her degree of constipation.  She currently uses hemorrhoid creams and wipes which controlled her symptoms pretty well.  Recommend she continue using these.  Notify us if any worsening symptoms or if these medications stop helping.

## 2017-02-22 NOTE — Assessment & Plan Note (Signed)
History of pylorospasm with gastric outlet obstruction status post Botox injections for relief.  Her symptoms are doing quite well at this time.  No persistent nausea or vomiting.  She is satisfied with her current status.  Recommend she continue current medications, follow-up in 1 year.  Call us if she has any worsening or breakthrough symptoms.

## 2017-02-22 NOTE — Patient Instructions (Signed)
1. Continue taking your current medications. 2. Continue using hemorrhoid creams and wipes that seems to control your hemorrhoid symptoms pretty well. 3. If you have breakthrough constipation despite using Linzess, you can take MiraLAX 1-2 times a day as needed to "get over the hump." 4. Return for follow-up in 1 year. 5. Call us if you have any worsening symptoms, questions, or concerns.

## 2017-02-22 NOTE — Assessment & Plan Note (Signed)
Patient has pretty significant constipation.  This is generally well managed with Linzess 290 mcg daily.  She does occasionally have breakthrough symptoms.  She sometimes will skip taking Linzess that she has to be out for a day to avoid an embarrassing episode.  I recommended if she does have constipation despite use of Linzess she can use MiraLAX 1-2 times a day as needed for breakthrough constipation.  Return for follow-up in 1 year.  Call for any worsening symptoms.

## 2017-02-22 NOTE — Progress Notes (Signed)
Referring Provider: [name], MD Primary Care Physician:  [name], MD Primary GI:  Dr. Gala Romney  Chief Complaint  Patient presents with  . Gastroesophageal Reflux    F/U. DOING OKAY    HPI:   Tracey Daniel is a 37 y.o. female who presents for follow-up on abdominal pain and GERD.  The patient was last seen in our office 06/30/2016 for nausea and vomiting, GERD, constipation.  Previous EGD completed 10/24/2015 with Botox injections for history of known pylorospasm producing partial functional gastric outlet obstruction.  Intermittent Reglan for gastroparesis.  Linzess 290 for constipation.  Protonix daily.  At her last office visit she was doing well overall with GERD well controlled and rare breakthrough.  Has worsening gas after eating.  Constipation overall pretty well controlled on Linzess 290 with occasional breakthrough.  Status post cholecystectomy.  Has implemented dietary changes.  No other GI symptoms.  Recommended continue PPI, Carafate as needed versus viscous lidocaine if that is ineffective.  Recommended labs including CBC, CMP, lipase.  Continue Linzess.  Follow-up in 6 months.  Labs were completed 07/23/2016 which found CBC with very mild anemia at 11.5 which appears chronic and unchanged, normal CMP, normal lipase.  Today she states she's doing well. N/V has abated for now after EGD and Botox. Constipation is doing well on Linzess 290 mcg, uses intermittently if she's going to leave the house. GERD doing well on PPI with "once and a while" Carafate. She does note sometimes she has to wait for 'food to pass down like normal." When this happens her appetite may falter but uses Ensure. Intermittent/rare abdominal pain which self-resolves. Denies hematochezia, melena, fever, chills. Her stools are variable from [facility] 1 to 5, depending on her Linzess dosing. Weight is stable today compared to 6 months ago. She does have intermittent hemorrhoids when she has  breakthrough constipation for which creams/wipes control her symptoms well. Denies chest pain, dyspnea, dizziness, lightheadedness, syncope, near syncope. Denies any other upper or lower GI symptoms.  Past Medical History:  Diagnosis Date  . Anemia of other chronic disease 11/29/2012  . Asthma   . Back pain, chronic   . Chronic abdominal pain   . Constipation   . COPD (chronic obstructive pulmonary disease) ([place])   . Cyst of skin    mid spine area  . Depression 2000   h/o suicidal ideation  . Gastric outlet obstruction   . Gastritis   . Gastroparesis   . Migraine   . Migraines   . Nausea and vomiting    recurrent  . Nicotine addiction   . Osteoporosis   . Peptic ulcer disease   . Pyloric spasm 03/30/2011  . Seasonal allergies   . Sinusitis   . Substance abuse ([place]) 2008   marijuana  . Vitamin D deficiency     Past Surgical History:  Procedure Laterality Date  . BALLOON DILATION N/A 02/09/2013   Procedure: BALLOON DILATION;  Surgeon: Missy Sabins, MD;  Location: [facility];  Service: Endoscopy;  Laterality: N/A;  . BOTOX INJECTION N/A 02/09/2013   Procedure: BOTOX INJECTION;  Surgeon: Missy Sabins, MD;  Location: [facility];  Service: Endoscopy;  Laterality: N/A;  possible balloon  . BOTOX INJECTION N/A 10/24/2015   Procedure: BOTOX INJECTION;  Surgeon: Daneil Dolin, MD;  Location: [facility];  Service: Endoscopy;  Laterality: N/A;  . CARPAL TUNNEL RELEASE     left hand  . CHOLECYSTECTOMY     ?  2002  . COLONOSCOPY WITH PROPOFOL N/A 09/20/2014   RMR: Normal ileo-colonoscopy  . ESOPHAGEAL DILATION  12/25/2010   Procedure: ESOPHAGEAL DILATION;  Surgeon: Dorothyann Peng, MD;  Location: [facility];  Service: Endoscopy;;  . ESOPHAGOGASTRODUODENOSCOPY  12/25/2010   Dorothyann Peng, MD;  moderate gastritis, ?goo secondary to pylorspasm. BX showed reactive gstropathy no h.pyori, SB mucosa with intramucosal lymphocytosis and partial villous blunting (TTG 4.0 normal)  .  ESOPHAGOGASTRODUODENOSCOPY N/A 11/14/2012   XTG:GYIRS hiatal hernia. Abnormal gastric mucosa of  uncertain significance-status post biopsy. Subjectively, patient may have recurrent symptomatic, pylorospasm.  . ESOPHAGOGASTRODUODENOSCOPY (EGD) WITH PROPOFOL N/A 02/09/2013   elongated stomach, partial lower esophageal ring widely patent. No obvious pyloric stenosis s/p Botox  . ESOPHAGOGASTRODUODENOSCOPY (EGD) WITH PROPOFOL N/A 10/24/2015   Procedure: ESOPHAGOGASTRODUODENOSCOPY (EGD) WITH PROPOFOL;  Surgeon: Daneil Dolin, MD;  Location: [facility];  Service: Endoscopy;  Laterality: N/A;  830   . laser surgery on cervix    . NASAL SEPTUM SURGERY  01/29/2017  . TOE SURGERY      Current Outpatient Medications  Medication Sig Dispense Refill  . azelastine (ASTELIN) 0.1 % nasal spray USE 2 SPRAYS IN EACH NOSTRIL TWICE DAILY AS DIRECTED 30 mL 0  . BIOTIN PO Take 1 tablet by mouth daily.    . calcium-vitamin D (OSCAL WITH D) 500-200 MG-UNIT per tablet Take 1 tablet by mouth 2 (two) times daily.    . chlorhexidine (PERIDEX) 0.12 % solution 5 mLs by Mouth Rinse route daily.  2  . clobetasol cream (TEMOVATE) 8.54 % Apply 1 application topically 2 (two) times daily as needed (rash). Apply to rash    . estradiol (ESTRACE) 1 MG tablet Take 1 tablet (1 mg total) by mouth daily. 30 tablet 11  . EVENING PRIMROSE OIL PO Take by mouth 2 (two) times daily.    . feeding supplement (ENSURE CLINICAL STRENGTH) LIQD Take 237 mLs by mouth 3 (three) times daily with meals. 10 Bottle 3  . fluticasone (FLONASE) 50 MCG/ACT nasal spray SHAKE LIQUID AND USE 2 SPRAYS IN EACH NOSTRIL DAILY 48 g 1  . gabapentin (NEURONTIN) 100 MG capsule Take 100 mg by mouth 3 (three) times daily.    Marland Kitchen LINZESS 290 MCG CAPS capsule TAKE 1 CAPSULE(290 MCG) BY MOUTH DAILY 30 capsule 11  . loratadine (CLARITIN) 10 MG tablet Take 1 tablet (10 mg total) by mouth daily. 30 tablet 5  . ondansetron (ZOFRAN-ODT) 4 MG disintegrating tablet DISSOLVE 1  TABLET ON THE TONGUE EVERY 8 HOURS AS NEEDED FOR NAUSEA 30 tablet 1  . pantoprazole (PROTONIX) 40 MG tablet TAKE 1 TABLET(40 MG) BY MOUTH TWICE DAILY BEFORE A MEAL 180 tablet 2  . potassium chloride SA (K-DUR,KLOR-CON) 20 MEQ tablet Take 1 tablet (20 mEq total) by mouth daily. 30 tablet 5  . pravastatin (PRAVACHOL) 20 MG tablet TAKE 1 TABLET(20 MG) BY MOUTH DAILY 90 tablet 0  . PREMARIN vaginal cream INSERT 6.27 APPLICATORFUL VAGINALLY THREE TIMES WEEKLY AS DIRECTED 30 g 2  . progesterone (PROMETRIUM) 200 MG capsule TAKE 1 CAPSULE BY MOUTH EVERY DAY FOR 2 WEEKS EVERY THIRD MONTH: JANUARY, APRIL, JULY AND OCTOBER 14 capsule 3  . propranolol (INDERAL) 10 MG tablet Take 1 tablet (10 mg total) by mouth 3 (three) times daily. 90 tablet 2  . risperiDONE (RISPERDAL) 0.5 MG tablet Take 0.5 mg at bedtime by mouth.    . sertraline (ZOLOFT) 50 MG tablet Take 50 mg daily by mouth.    Marland Kitchen  sucralfate (CARAFATE) 1 g tablet TAKE 1 TABLET BY MOUTH EVERY MORNING AND 1 TABLET AT BEDTIME AS NEEDED FOR STOMACH BURNING( CRUSH 1 TABLET AND MIX IN APPLESAUCE) 180 tablet 3  . SYMBICORT 80-4.5 MCG/ACT inhaler INHALE 2 PUFFS INTO THE LUNGS TWICE DAILY 10.2 g 5  . tiZANidine (ZANAFLEX) 4 MG tablet One tablet twice daily for back spasm 60 tablet 3  . VENTOLIN HFA 108 (90 Base) MCG/ACT inhaler INHALE 2 PUFFS INTO THE LUNGS EVERY 6 HOURS AS NEEDED FOR SHORTNESS OF BREATH 18 g 0  . zonisamide (ZONEGRAN) 25 MG capsule 50 mg 3 (three) times daily.      No current facility-administered medications for this visit.     Allergies as of 02/22/2017 - Review Complete 02/22/2017  Allergen Reaction Noted  . Benadryl [diphenhydramine] Anaphylaxis 03/25/2014  . Latex Itching and Other (See Comments) 11/30/2011  . Penicillins Other (See Comments)     Family History  Problem Relation Age of Onset  . Diabetes Father   . Liver disease Father        liver transplant at [facility], age 77  . Lung cancer Mother 19  . Heart disease Maternal  Grandmother   . Parkinson's disease Maternal Grandfather   . Multiple sclerosis Sister 22  . Depression Sister 33  . Alcohol abuse Sister   . Bipolar disorder Sister   . Schizophrenia Sister   . Colon cancer Neg Hx     Social History   Socioeconomic History  . Marital status: Single    Spouse name: None  . Number of children: 0  . Years of education: None  . Highest education level: None  Social Needs  . Financial resource strain: None  . Food insecurity - worry: None  . Food insecurity - inability: None  . Transportation needs - medical: None  . Transportation needs - non-medical: None  Occupational History  . Occupation: disability    Employer: UNEMPLOYED  Tobacco Use  . Smoking status: Current Every Day Smoker    Packs/day: 0.10    Years: 15.00    Pack years: 1.50    Types: Cigarettes  . Smokeless tobacco: Never Used  Substance and Sexual Activity  . Alcohol use: No    Alcohol/week: 0.0 oz  . Drug use: No  . Sexual activity: Yes    Birth control/protection: None  Other Topics Concern  . None  Social History Narrative  . None    Review of Systems: General: Negative for anorexia, weight loss, fever, chills, fatigue, weakness. Eyes: Negative for vision changes.  ENT: Negative for hoarseness, difficulty swallowing , nasal congestion. CV: Negative for chest pain, angina, palpitations, dyspnea on exertion, peripheral edema.  Respiratory: Negative for dyspnea at rest, dyspnea on exertion, cough, sputum, wheezing.  GI: See history of present illness. GU:  Negative for dysuria, hematuria, urinary incontinence, urinary frequency, nocturnal urination.  MS: Negative for joint pain, low back pain.  Derm: Negative for rash or itching.  Neuro: Negative for weakness, abnormal sensation, seizure, frequent headaches, memory loss, confusion.  Psych: Negative for anxiety, depression, suicidal ideation, hallucinations.  Endo: Negative for unusual weight change.  Heme:  Negative for bruising or bleeding. Allergy: Negative for rash or hives.   Physical Exam: BP (!) 106/58   Pulse 74   Temp (!) 97.5 F (36.4 C) (Oral)   Ht 5\' 6"  (1.676 m)   Wt 147 lb (66.7 kg)   BMI 23.73 kg/m  General:   Alert and oriented. Pleasant and cooperative.  Well-nourished and well-developed.  Head:  Normocephalic and atraumatic. Eyes:  Without icterus, sclera clear and conjunctiva pink.  Ears:  Normal auditory acuity. Mouth:  No deformity or lesions, oral mucosa pink.  Throat/Neck:  Supple, without mass or thyromegaly. Cardiovascular:  S1, S2 present without murmurs appreciated. Normal pulses noted. Extremities without clubbing or edema. Respiratory:  Clear to auscultation bilaterally. No wheezes, rales, or rhonchi. No distress.  Gastrointestinal:  +BS, soft, non-tender and non-distended. No HSM noted. No guarding or rebound. No masses appreciated.  Rectal:  Deferred  Musculoskalatal:  Symmetrical without gross deformities. Normal posture. Skin:  Intact without significant lesions or rashes. Neurologic:  Alert and oriented x4;  grossly normal neurologically. Psych:  Alert and cooperative. Normal mood and affect. Heme/Lymph/Immune: No significant cervical adenopathy. No excessive bruising noted.    02/22/2017 8:38 AM   Disclaimer: This note was dictated with voice recognition software. Similar sounding words can inadvertently be transcribed and may not be corrected upon review.

## 2017-02-22 NOTE — Assessment & Plan Note (Signed)
GERD symptoms currently well controlled on PPI with intermittent/rare need of Carafate.  Pylorospasm likely causes intermittent exacerbation of her reflux given decreased outflow during spasm.  Continue with her current PPI and Carafate.  Return for follow-up in 1 year.  Call if any worsening symptoms.

## 2017-02-22 NOTE — Progress Notes (Signed)
cc'ed to pcp °

## 2017-02-27 ENCOUNTER — Encounter: Payer: Self-pay | Admitting: Family Medicine

## 2017-02-27 NOTE — Progress Notes (Signed)
   Tracey Daniel     MRN: [id number]      DOB: 1980/07/27   HPI Tracey Daniel is here for follow up and re-evaluation of chronic medical conditions, medication management and review of any available recent lab and radiology data.  Preventive health is updated, specifically  Cancer screening and Immunization.   Questions or concerns regarding consultations or procedures which the PT has had in the interim are  addressed. The PT denies any adverse reactions to current medications since the last visit.  There are no new concerns.  There are no specific complaints   ROS Denies recent fever or chills. Denies sinus pressure, nasal congestion, ear pain or sore throat. Denies chest congestion, productive cough or wheezing. Denies chest pains, palpitations and leg swelling Denies abdominal pain, nausea, vomiting,diarrhea or constipation.   Denies dysuria, frequency, hesitancy or incontinence. Denies joint pain, swelling and limitation in mobility. Denies headaches, seizures, numbness, or tingling. Denies depression, anxiety or insomnia. Denies skin break down or rash.   PE BP 110/70   Pulse 60   Resp 14   Ht 5\' 6"  (1.676 m)   Wt 145 lb (65.8 kg)   BMI 23.40 kg/m   Patient alert and oriented and in no cardiopulmonary distress.  HEENT: No facial asymmetry, EOMI,   oropharynx pink and moist.  Neck supple no JVD, no mass.  Chest: Clear to auscultation bilaterally.  CVS: S1, S2 no murmurs, no S3.Regular rate.  ABD: Soft non tender.   Ext: No edema  MS: Adequate ROM spine, shoulders, hips and knees.  Skin: Intact, no ulcerations or rash noted.  Psych: Good eye contact, normal affect. Memory intact not anxious or depressed appearing.  CNS: CN 2-12 intact, power,  normal throughout.no focal deficits noted.   Assessment & Plan  Depression Imposing now that she is being treated by psychiatry. Denies crying spells or suicidal or homicidal ideation  GERD (gastroesophageal  reflux disease) Controlled and followed by GI  Hyperlipemia Hyperlipidemia:Low fat diet discussed and encouraged.   Lipid Panel  Lab Results  Component Value Date   CHOL 181 07/23/2016   HDL 43 (L) 07/23/2016   LDLCALC 112 (H) 07/23/2016   TRIG 129 07/23/2016   CHOLHDL 4.2 07/23/2016   Updated lab needed at/ before next visit.    Seasonal allergies Adequate;ly controlled on current medication  Vitamin D deficiency Updated lab needed at/ before next visit.   Nicotine dependence, cigarettes, with other nicotine-induced disorders Asked and confirmed current nicotione use of  3to 5 / day Advised of need to quit esp in light f asthma Assess readiness, plans to quit in am Assist : counsel on techniques to assist for 3 mins and provided material and 1800 quit now tele line Arrange f/u in 5 months

## 2017-02-27 NOTE — Assessment & Plan Note (Deleted)
Asked and confirms current cigarette [place] approximately 3 /day Advised of need to quit esp in light of the fact that she has asthma Assess readiness, she is ready and plans to quit tomorrow Assistance offered in counseling for 5 minutes re techniques to help with quitting, 1800 quit now # provided and also written info of benefits and strategies Arranged f/u visit in 5 months

## 2017-02-27 NOTE — Assessment & Plan Note (Signed)
Updated lab needed at/ before next visit.   

## 2017-02-27 NOTE — Assessment & Plan Note (Signed)
Adequately controlled on current medication. 

## 2017-02-27 NOTE — Assessment & Plan Note (Signed)
Imposing now that she is being treated by psychiatry. Denies crying spells or suicidal or homicidal ideation

## 2017-02-27 NOTE — Assessment & Plan Note (Signed)
Controlled and followed by GI 

## 2017-02-27 NOTE — Assessment & Plan Note (Signed)
Asked and confirmed current nicotione use of  3to 5 / day Advised of need to quit esp in light f asthma Assess readiness, plans to quit in am Assist : counsel on techniques to assist for 3 mins and provided material and 1800 quit now tele line Arrange f/u in 5 months

## 2017-02-27 NOTE — Assessment & Plan Note (Signed)
Hyperlipidemia:Low fat diet discussed and encouraged.   Lipid Panel  Lab Results  Component Value Date   CHOL 181 07/23/2016   HDL 43 (L) 07/23/2016   LDLCALC 112 (H) 07/23/2016   TRIG 129 07/23/2016   CHOLHDL 4.2 07/23/2016   Updated lab needed at/ before next visit.

## 2017-03-04 DIAGNOSIS — R51 Headache: Secondary | ICD-10-CM | POA: Diagnosis not present

## 2017-03-04 DIAGNOSIS — M542 Cervicalgia: Secondary | ICD-10-CM | POA: Diagnosis not present

## 2017-03-04 DIAGNOSIS — G43719 Chronic migraine without aura, intractable, without status migrainosus: Secondary | ICD-10-CM | POA: Diagnosis not present

## 2017-03-04 DIAGNOSIS — M791 Myalgia, unspecified site: Secondary | ICD-10-CM | POA: Diagnosis not present

## 2017-03-13 ENCOUNTER — Other Ambulatory Visit: Payer: Self-pay | Admitting: Family Medicine

## 2017-03-20 ENCOUNTER — Emergency Department (HOSPITAL_COMMUNITY): Payer: Medicare HMO

## 2017-03-20 ENCOUNTER — Emergency Department (HOSPITAL_COMMUNITY)
Admission: EM | Admit: 2017-03-20 | Discharge: 2017-03-20 | Disposition: A | Discharge status: Home / Self Care | Payer: Medicare HMO | Attending: Emergency Medicine | Admitting: Emergency Medicine

## 2017-03-20 ENCOUNTER — Encounter (HOSPITAL_COMMUNITY): Payer: Self-pay

## 2017-03-20 DIAGNOSIS — R1110 Vomiting, unspecified: Secondary | ICD-10-CM | POA: Diagnosis not present

## 2017-03-20 DIAGNOSIS — Z79899 Other long term (current) drug therapy: Secondary | ICD-10-CM | POA: Insufficient documentation

## 2017-03-20 DIAGNOSIS — R1084 Generalized abdominal pain: Secondary | ICD-10-CM | POA: Diagnosis not present

## 2017-03-20 DIAGNOSIS — F17210 Nicotine dependence, cigarettes, uncomplicated: Secondary | ICD-10-CM | POA: Diagnosis not present

## 2017-03-20 DIAGNOSIS — R109 Unspecified abdominal pain: Secondary | ICD-10-CM

## 2017-03-20 DIAGNOSIS — J45991 Cough variant asthma: Secondary | ICD-10-CM | POA: Diagnosis not present

## 2017-03-20 DIAGNOSIS — R197 Diarrhea, unspecified: Secondary | ICD-10-CM | POA: Diagnosis not present

## 2017-03-20 DIAGNOSIS — J449 Chronic obstructive pulmonary disease, unspecified: Secondary | ICD-10-CM | POA: Insufficient documentation

## 2017-03-20 LAB — CBC
HEMATOCRIT: 32.9 % — AB (ref 36.0–46.0)
Hemoglobin: 11.1 g/dL — ABNORMAL LOW (ref 12.0–15.0)
MCH: 30.8 pg (ref 26.0–34.0)
MCHC: 33.7 g/dL (ref 30.0–36.0)
MCV: 91.4 fL (ref 78.0–100.0)
Platelets: 203 10*3/uL (ref 150–400)
RBC: 3.6 MIL/uL — ABNORMAL LOW (ref 3.87–5.11)
RDW: 13.3 % (ref 11.5–15.5)
WBC: 14.5 10*3/uL — ABNORMAL HIGH (ref 4.0–10.5)

## 2017-03-20 LAB — COMPREHENSIVE METABOLIC PANEL
ALBUMIN: 4.8 g/dL (ref 3.5–5.0)
ALK PHOS: 78 U/L (ref 38–126)
ALT: 19 U/L (ref 14–54)
AST: 29 U/L (ref 15–41)
Anion gap: 12 (ref 5–15)
BILIRUBIN TOTAL: 0.6 mg/dL (ref 0.3–1.2)
BUN: 22 mg/dL — ABNORMAL HIGH (ref 6–20)
CO2: 20 mmol/L — ABNORMAL LOW (ref 22–32)
Calcium: 9.8 mg/dL (ref 8.9–10.3)
Chloride: 107 mmol/L (ref 101–111)
Creatinine, Ser: 0.74 mg/dL (ref 0.44–1.00)
GFR calc Af Amer: >60 mL/min (ref >60)
GFR calc non Af Amer: >60 mL/min (ref >60)
GLUCOSE: 147 mg/dL — AB (ref 65–99)
Potassium: 3.5 mmol/L (ref 3.5–5.1)
Sodium: 139 mmol/L (ref 135–145)
TOTAL PROTEIN: 7.8 g/dL (ref 6.5–8.1)

## 2017-03-20 LAB — I-STAT TROPONIN, ED: TROPONIN I, POC: 0 ng/mL (ref 0.00–0.08)

## 2017-03-20 LAB — LIPASE, BLOOD: Lipase: 21 U/L (ref 11–51)

## 2017-03-20 LAB — I-STAT BETA HCG BLOOD, ED (MC, WL, AP ONLY): HCG, QUANTITATIVE: 5.1 m[IU]/mL — AB (ref <5)

## 2017-03-20 LAB — URINALYSIS, ROUTINE W REFLEX MICROSCOPIC
BACTERIA UA: NONE SEEN
Bilirubin Urine: NEGATIVE
Glucose, UA: NEGATIVE mg/dL
HGB URINE DIPSTICK: NEGATIVE
Ketones, ur: 20 mg/dL — AB
Leukocytes, UA: NEGATIVE
NITRITE: NEGATIVE
Protein, ur: 30 mg/dL — AB
SPECIFIC GRAVITY, URINE: 1.021 (ref 1.005–1.030)
pH: 9 — ABNORMAL HIGH (ref 5.0–8.0)

## 2017-03-20 MED ORDER — SODIUM CHLORIDE 0.9 % IV SOLN
1000.0000 mL | INTRAVENOUS | Status: DC
  Administered 2017-03-20 (×2): 1000 mL via INTRAVENOUS

## 2017-03-20 MED ORDER — HYDROMORPHONE HCL 1 MG/ML IJ SOLN
0.5000 mg | Freq: Once | INTRAMUSCULAR | Status: AC
  Administered 2017-03-20: 0.5 mg via INTRAVENOUS
  Filled 2017-03-20: qty 1

## 2017-03-20 MED ORDER — ONDANSETRON HCL 4 MG/2ML IJ SOLN
4.0000 mg | Freq: Once | INTRAMUSCULAR | Status: AC
  Administered 2017-03-20: 4 mg via INTRAVENOUS
  Filled 2017-03-20: qty 2

## 2017-03-20 MED ORDER — IOPAMIDOL (ISOVUE-300) INJECTION 61%
INTRAVENOUS | Status: AC
  Filled 2017-03-20: qty 100

## 2017-03-20 MED ORDER — IOPAMIDOL (ISOVUE-300) INJECTION 61%
100.0000 mL | Freq: Once | INTRAVENOUS | Status: AC | PRN
  Administered 2017-03-20: 100 mL via INTRAVENOUS

## 2017-03-20 MED ORDER — SODIUM CHLORIDE 0.9 % IJ SOLN
INTRAMUSCULAR | Status: AC
  Administered 2017-03-20: 1000 mL via INTRAVENOUS
  Filled 2017-03-20: qty 50

## 2017-03-20 MED ORDER — SODIUM CHLORIDE 0.9 % IV BOLUS (SEPSIS)
1000.0000 mL | Freq: Once | INTRAVENOUS | Status: AC
  Administered 2017-03-20: 1000 mL via INTRAVENOUS

## 2017-03-20 MED ORDER — HYDROMORPHONE HCL 1 MG/ML IJ SOLN
0.5000 mg | Freq: Once | INTRAMUSCULAR | Status: DC
  Filled 2017-03-20: qty 1

## 2017-03-20 MED ORDER — FENTANYL CITRATE (PF) 100 MCG/2ML IJ SOLN
50.0000 ug | INTRAMUSCULAR | Status: DC | PRN
  Administered 2017-03-20: 50 ug via INTRAVENOUS
  Filled 2017-03-20: qty 2

## 2017-03-20 MED ORDER — HYDROMORPHONE HCL 1 MG/ML IJ SOLN
1.0000 mg | Freq: Once | INTRAMUSCULAR | Status: AC
  Administered 2017-03-20: 1 mg via INTRAVENOUS
  Filled 2017-03-20: qty 1

## 2017-03-20 NOTE — ED Triage Notes (Signed)
Pt complains of vomiting and diarrhea since 8pm last night Pt has hx of gastroparesis

## 2017-03-20 NOTE — ED Provider Notes (Signed)
[facility] DEPT Provider Note   CSN: [id number] Arrival date & time: 03/20/17  0425     History   Chief Complaint Chief Complaint  Patient presents with  . Abdominal Pain    HPI Tracey Daniel is a 37 y.o. female.  The history is provided by the patient.  Abdominal Pain   This is a new problem. The current episode started yesterday. The problem occurs constantly. The problem has been gradually worsening. The pain is located in the generalized abdominal region. The pain is severe. Associated symptoms include diarrhea and vomiting. Pertinent negatives include fever and dysuria. Nothing relieves the symptoms. Past medical history comments: Prior history of gastroparesis but it has been a few years.    Past Medical History:  Diagnosis Date  . Anemia of other chronic disease 11/29/2012  . Asthma   . Back pain, chronic   . Chronic abdominal pain   . Constipation   . COPD (chronic obstructive pulmonary disease) ([place])   . Cyst of skin    mid spine area  . Depression 2000   h/o suicidal ideation  . Gastric outlet obstruction   . Gastritis   . Gastroparesis   . Migraine   . Migraines   . Nausea and vomiting    recurrent  . Nicotine addiction   . Osteoporosis   . Peptic ulcer disease   . Pyloric spasm 03/30/2011  . Seasonal allergies   . Sinusitis   . Substance abuse ([place]) 2008   marijuana  . Vitamin D deficiency     Patient Active Problem List   Diagnosis Date Noted  . Hemorrhoids 02/22/2017  . Nicotine dependence, cigarettes, with other nicotine-induced disorders 12/12/2016  . Thoracic spine pain 11/23/2016  . Nasal obstruction 11/23/2016  . Chest pain 06/30/2016  . Hyperlipemia 10/02/2015  . Premature ovarian failure 02/04/2015  . Pregnancy as incidental finding, low positive preg test, considered pituitary production of HCG 02/04/2015  . Abdominal pain, epigastric 08/27/2014  . Vitamin D deficiency 05/16/2014  . Alopecia  08/01/2013  . Asthma, cough variant 12/01/2012  . 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 Marijuana use 11/14/2012  . Seasonal allergies 11/14/2012  . Nausea without vomiting 11/09/2012  . Constipation 12/02/2011  . Gastroparesis 05/01/2011  . Pyloric spasm 03/30/2011  . Hip pain, right 03/02/2011  . Anemia 01/30/2011  . Gastric outlet obstruction 01/01/2011  . GERD (gastroesophageal reflux disease) 08/21/2010  . Headache 03/13/2008  . AMENORRHEA, SECONDARY 09/27/2007  . ECZEMA 09/27/2007  . Depression 07/01/2007  . BACK PAIN, CHRONIC 07/01/2007  . CLOSED FRACTURE OF METATARSAL BONE 01/27/2007    Past Surgical History:  Procedure Laterality Date  . BALLOON DILATION N/A 02/09/2013   Procedure: BALLOON DILATION;  Surgeon: Missy Sabins, MD;  Location: [facility];  Service: Endoscopy;  Laterality: N/A;  . BOTOX INJECTION N/A 02/09/2013   Procedure: BOTOX INJECTION;  Surgeon: Missy Sabins, MD;  Location: [facility];  Service: Endoscopy;  Laterality: N/A;  possible balloon  . BOTOX INJECTION N/A 10/24/2015   Procedure: BOTOX INJECTION;  Surgeon: Daneil Dolin, MD;  [facility];  Service: Endoscopy;  Laterality: N/A;  . CARPAL TUNNEL RELEASE     left hand  . CHOLECYSTECTOMY     ?2002  . COLONOSCOPY WITH PROPOFOL N/A 09/20/2014   RMR: Normal ileo-colonoscopy  . ESOPHAGEAL DILATION  12/25/2010   Procedure: ESOPHAGEAL DILATION;  Surgeon: Dorothyann Peng, MD;  [facility];  Service: Endoscopy;;  .  ESOPHAGOGASTRODUODENOSCOPY  12/25/2010   Dorothyann Peng, MD;  moderate gastritis, ?goo secondary to pylorspasm. BX showed reactive gstropathy no h.pyori, SB mucosa with intramucosal lymphocytosis and partial villous blunting (TTG 4.0 normal)  . ESOPHAGOGASTRODUODENOSCOPY N/A 11/14/2012   QIH:[id number] hiatal hernia. Abnormal gastric mucosa of  uncertain significance-status post biopsy. Subjectively, patient may have recurrent symptomatic, pylorospasm.    . ESOPHAGOGASTRODUODENOSCOPY (EGD) WITH PROPOFOL N/A 02/09/2013   elongated stomach, partial lower esophageal ring widely patent. No obvious pyloric stenosis s/p Botox  . ESOPHAGOGASTRODUODENOSCOPY (EGD) WITH PROPOFOL N/A 10/24/2015   Procedure: ESOPHAGOGASTRODUODENOSCOPY (EGD) WITH PROPOFOL;  Surgeon: Daneil Dolin, MD;  [facility];  Service: Endoscopy;  Laterality: N/A;  830   . laser surgery on cervix    . NASAL SEPTUM SURGERY  01/29/2017  . TOE SURGERY      OB History    Gravida Para Term Preterm AB Living   0             SAB TAB Ectopic Multiple Live Births                   Home Medications    Prior to Admission medications   Medication Sig Start Date End Date Taking? Authorizing Provider  azelastine (ASTELIN) 0.1 % nasal spray USE 2 SPRAYS IN EACH NOSTRIL TWICE DAILY AS DIRECTED 03/16/17   [name], MD  BIOTIN PO Take 1 tablet by mouth daily.    [provider]  calcium-vitamin D (OSCAL WITH D) 500-200 MG-UNIT per tablet Take 1 tablet by mouth 2 (two) times daily.    [provider]  chlorhexidine (PERIDEX) 0.12 % solution 5 mLs by Mouth Rinse route daily. 05/22/16   [provider]  clobetasol cream (TEMOVATE) 9.56 % Apply 1 application topically 2 (two) times daily as needed (rash). Apply to rash    [provider]  estradiol (ESTRACE) 1 MG tablet Take 1 tablet (1 mg total) by mouth daily. 11/03/16 11/03/17  Jonnie Kind, MD  EVENING PRIMROSE OIL PO Take by mouth 2 (two) times daily.    [provider]  feeding supplement (ENSURE CLINICAL STRENGTH) LIQD Take 237 mLs by mouth 3 (three) times daily with meals. 01/01/11   Dhungel, Flonnie Overman, MD  fluticasone (FLONASE) 50 MCG/ACT nasal spray SHAKE LIQUID AND USE 2 SPRAYS IN EACH NOSTRIL DAILY 02/04/17   [name], MD  gabapentin (NEURONTIN) 100 MG capsule Take 100 mg by mouth 3 (three) times daily.    [provider]  LINZESS 290 MCG CAPS  capsule TAKE 1 CAPSULE(290 MCG) BY MOUTH DAILY 07/23/16   Mahala Menghini, PA-C  loratadine (CLARITIN) 10 MG tablet Take 1 tablet (10 mg total) by mouth daily. 10/09/11   [name], MD  ondansetron (ZOFRAN-ODT) 4 MG disintegrating tablet DISSOLVE 1 TABLET ON THE TONGUE EVERY 8 HOURS AS NEEDED FOR NAUSEA 12/28/16   Annitta Needs, NP  pantoprazole (PROTONIX) 40 MG tablet TAKE 1 TABLET(40 MG) BY MOUTH TWICE DAILY BEFORE A MEAL 01/14/17   Carlis Stable, NP  potassium chloride SA (K-DUR,KLOR-CON) 20 MEQ tablet Take 1 tablet (20 mEq total) by mouth daily. 02/18/17   [name], MD  pravastatin (PRAVACHOL) 20 MG tablet TAKE 1 TABLET(20 MG) BY MOUTH DAILY 02/04/17   [name], MD  PREMARIN vaginal cream INSERT 3.87 APPLICATORFUL VAGINALLY THREE TIMES WEEKLY AS DIRECTED 12/15/16   Jonnie Kind, MD  progesterone (PROMETRIUM) 200 MG capsule TAKE 1 CAPSULE  BY MOUTH EVERY DAY FOR 2 WEEKS EVERY THIRD MONTH: Quentin Mulling AND OCTOBER 02/03/17   Jonnie Kind, MD  propranolol (INDERAL) 10 MG tablet Take 1 tablet (10 mg total) by mouth 3 (three) times daily. 02/18/17   [name], MD  risperiDONE (RISPERDAL) 0.5 MG tablet Take 0.5 mg at bedtime by mouth.    [provider]  sertraline (ZOLOFT) 50 MG tablet Take 50 mg daily by mouth.    [provider]  sucralfate (CARAFATE) 1 g tablet TAKE 1 TABLET BY MOUTH EVERY MORNING AND 1 TABLET AT BEDTIME AS NEEDED FOR STOMACH BURNING( CRUSH 1 TABLET AND MIX IN APPLESAUCE) 04/20/16   Annitta Needs, NP  SYMBICORT 80-4.5 MCG/ACT inhaler INHALE 2 PUFFS INTO THE LUNGS TWICE DAILY 01/13/17   [name], MD  tiZANidine (ZANAFLEX) 4 MG tablet One tablet twice daily for back spasm 02/18/17   [name], MD  VENTOLIN HFA 108 [phone] Base) MCG/ACT inhaler INHALE 2 PUFFS INTO THE LUNGS EVERY 6 HOURS AS NEEDED FOR SHORTNESS OF BREATH 03/16/17   [name], MD  zonisamide (ZONEGRAN) 25 MG capsule 50 mg 3 (three)  times daily.  11/07/15   [provider]    Family History Family History  Problem Relation Age of Onset  . Diabetes Father   . Liver disease Father        liver transplant at [facility], age 39  . Lung cancer Mother 52  . Heart disease Maternal Grandmother   . Parkinson's disease Maternal Grandfather   . Multiple sclerosis Sister 50  . Depression Sister 57  . Alcohol abuse Sister   . Bipolar disorder Sister   . Schizophrenia Sister   . Colon cancer Neg Hx     Social History Social History   Tobacco Use  . Smoking status: Current Every Day Smoker    Packs/day: 0.10    Years: 15.00    Pack years: 1.50    Types: Cigarettes  . Smokeless tobacco: Never Used  Substance Use Topics  . Alcohol use: No    Alcohol/week: 0.0 oz  . Drug use: No     Allergies   Benadryl [diphenhydramine]; Latex; and Penicillins   Review of Systems Review of Systems  Constitutional: Negative for fever.  Gastrointestinal: Positive for abdominal pain, diarrhea and vomiting.  Genitourinary: Negative for dysuria.  All other systems reviewed and are negative.    Physical Exam Updated Vital Signs BP (!) 122/58 (BP Location: Right Arm)   Pulse (!) 107   Temp (!) 97.5 F (36.4 C) (Oral)   Resp 18   SpO2 100%   Physical Exam  Constitutional: She appears well-developed and well-nourished. She appears ill. No distress.  HENT:  Head: Normocephalic and atraumatic.  Right Ear: External ear normal.  Left Ear: External ear normal.  Eyes: Conjunctivae are normal. Right eye exhibits no discharge. Left eye exhibits no discharge. No scleral icterus.  Neck: Neck supple. No tracheal deviation present.  Cardiovascular: Normal rate, regular rhythm and intact distal pulses.  Pulmonary/Chest: Effort normal and breath sounds normal. No stridor. No respiratory distress. She has no wheezes. She has no rales.  Abdominal: Soft. Bowel sounds are normal. She exhibits no distension and no mass. There is  generalized tenderness. There is no rigidity, no rebound and no guarding. No hernia.  Musculoskeletal: She exhibits no edema or tenderness.  Neurological: She is alert. She has normal strength. No cranial nerve deficit (no facial droop, extraocular movements  intact, no slurred speech) or sensory deficit. She exhibits normal muscle tone. She displays no seizure activity. Coordination normal.  Skin: Skin is warm and dry. No rash noted.  Psychiatric: She has a normal mood and affect.  Nursing note and vitals reviewed.    ED Treatments / Results  Labs (all labs ordered are listed, but only abnormal results are displayed) Labs Reviewed  CBC - Abnormal; Notable for the following components:      Result Value   WBC 14.5 (*)    RBC 3.60 (*)    Hemoglobin 11.1 (*)    HCT 32.9 (*)    All other components within normal limits  I-STAT BETA HCG BLOOD, ED ([facility], [facility], [facility] ONLY) - Abnormal; Notable for the following components:   I-stat hCG, quantitative 5.1 (*)    All other components within normal limits  LIPASE, BLOOD  COMPREHENSIVE METABOLIC PANEL  URINALYSIS, ROUTINE W REFLEX MICROSCOPIC  I-STAT TROPONIN, ED    EKG  EKG Interpretation  Date/Time:  Saturday March 20 2017 05:07:34 EST Ventricular Rate:  91 PR Interval:    QRS Duration: 104 QT Interval:  385 QTC Calculation: 474 R Axis:   83 Text Interpretation:  Sinus rhythm Borderline repolarization abnormality Premature ventricular complexes No significant change since last tracing Confirmed by Dorie Rank [phone]) on 03/20/2017 5:12:22 AM       Radiology No results found.  Procedures Procedures (including critical care time)  Medications Ordered in ED Medications  sodium chloride 0.9 % bolus 1,000 mL (not administered)    Followed by  sodium chloride 0.9 % bolus 1,000 mL (not administered)    Followed by  0.9 %  sodium chloride infusion (not administered)  ondansetron (ZOFRAN) injection 4 mg (not administered)  HYDROmorphone  (DILAUDID) injection 1 mg (not administered)  ondansetron (ZOFRAN) injection 4 mg (4 mg Intravenous Given 03/20/17 0525)     Initial Impression / Assessment and Plan / ED Course  I have reviewed the triage vital signs and the nursing notes.  Pertinent labs & imaging results that were available during my care of the patient were reviewed by me and considered in my medical decision making (see chart for details).  Clinical Course as of Mar 21 735  Sat Mar 20, 2017  0722 Pt still having persistent symptoms.  Will ct to evaluate further  [JK]  0723 Labs Notable for stable anemia and a leukocytosis.  CMET c/w dehydration  [JK]    Clinical Course User Index [JK] Dorie Rank, MD    Pt presents with severe abdominal pain.    Hx of gastroparesis but has not had problems for years.  Elevated WBC noted.  With her persistent vomiting, will ct to evaluate further, rule out obstruction.  Will turn over to Dr Wilson Singer  Final Clinical Impressions(s) / ED Diagnoses  pending   Dorie Rank, MD 03/20/17 [phone]

## 2017-03-29 DIAGNOSIS — F333 Major depressive disorder, recurrent, severe with psychotic symptoms: Secondary | ICD-10-CM | POA: Diagnosis not present

## 2017-03-30 ENCOUNTER — Other Ambulatory Visit: Payer: Self-pay | Admitting: Obstetrics and Gynecology

## 2017-03-30 NOTE — Telephone Encounter (Signed)
refil prometrium x 14 days with 1 refil

## 2017-04-10 ENCOUNTER — Other Ambulatory Visit: Payer: Self-pay | Admitting: Family Medicine

## 2017-04-23 DIAGNOSIS — F333 Major depressive disorder, recurrent, severe with psychotic symptoms: Secondary | ICD-10-CM | POA: Diagnosis not present

## 2017-04-27 ENCOUNTER — Other Ambulatory Visit: Payer: Self-pay | Admitting: Obstetrics and Gynecology

## 2017-04-27 NOTE — Telephone Encounter (Signed)
QUARTERLY PROMETRIUM REFILLED. X 6 MONTHS.

## 2017-05-03 DIAGNOSIS — F333 Major depressive disorder, recurrent, severe with psychotic symptoms: Secondary | ICD-10-CM | POA: Diagnosis not present

## 2017-05-05 ENCOUNTER — Other Ambulatory Visit: Payer: Self-pay | Admitting: Family Medicine

## 2017-05-05 ENCOUNTER — Other Ambulatory Visit: Payer: Self-pay | Admitting: Gastroenterology

## 2017-05-05 DIAGNOSIS — K219 Gastro-esophageal reflux disease without esophagitis: Secondary | ICD-10-CM

## 2017-05-05 DIAGNOSIS — K313 Pylorospasm, not elsewhere classified: Secondary | ICD-10-CM

## 2017-05-05 DIAGNOSIS — K5909 Other constipation: Secondary | ICD-10-CM

## 2017-05-05 DIAGNOSIS — K3184 Gastroparesis: Secondary | ICD-10-CM

## 2017-05-08 ENCOUNTER — Other Ambulatory Visit: Payer: Self-pay | Admitting: Family Medicine

## 2017-05-10 DIAGNOSIS — M542 Cervicalgia: Secondary | ICD-10-CM | POA: Diagnosis not present

## 2017-05-10 DIAGNOSIS — M791 Myalgia, unspecified site: Secondary | ICD-10-CM | POA: Diagnosis not present

## 2017-05-10 DIAGNOSIS — G43719 Chronic migraine without aura, intractable, without status migrainosus: Secondary | ICD-10-CM | POA: Diagnosis not present

## 2017-05-10 DIAGNOSIS — R51 Headache: Secondary | ICD-10-CM | POA: Diagnosis not present

## 2017-05-21 ENCOUNTER — Other Ambulatory Visit: Payer: Self-pay | Admitting: Gastroenterology

## 2017-05-25 ENCOUNTER — Encounter: Payer: Self-pay | Admitting: Advanced Practice Midwife

## 2017-05-25 ENCOUNTER — Ambulatory Visit (INDEPENDENT_AMBULATORY_CARE_PROVIDER_SITE_OTHER): Payer: Medicare HMO | Admitting: Advanced Practice Midwife

## 2017-05-25 ENCOUNTER — Other Ambulatory Visit: Payer: Self-pay | Admitting: *Deleted

## 2017-05-25 ENCOUNTER — Other Ambulatory Visit (HOSPITAL_COMMUNITY)
Admission: RE | Admit: 2017-05-25 | Discharge: 2017-05-25 | Disposition: A | Discharge status: Home / Self Care | Payer: Medicare HMO | Source: Ambulatory Visit | Attending: Advanced Practice Midwife | Admitting: Advanced Practice Midwife

## 2017-05-25 ENCOUNTER — Other Ambulatory Visit: Payer: Self-pay

## 2017-05-25 VITALS — BP 100/54 | HR 59 | Ht 66.0 in | Wt 148.0 lb

## 2017-05-25 DIAGNOSIS — K3184 Gastroparesis: Secondary | ICD-10-CM

## 2017-05-25 DIAGNOSIS — N62 Hypertrophy of breast: Secondary | ICD-10-CM | POA: Diagnosis not present

## 2017-05-25 DIAGNOSIS — R87810 Cervical high risk human papillomavirus (HPV) DNA test positive: Secondary | ICD-10-CM | POA: Insufficient documentation

## 2017-05-25 DIAGNOSIS — Z124 Encounter for screening for malignant neoplasm of cervix: Secondary | ICD-10-CM | POA: Insufficient documentation

## 2017-05-25 DIAGNOSIS — K5909 Other constipation: Secondary | ICD-10-CM

## 2017-05-25 DIAGNOSIS — K313 Pylorospasm, not elsewhere classified: Secondary | ICD-10-CM

## 2017-05-25 DIAGNOSIS — K219 Gastro-esophageal reflux disease without esophagitis: Secondary | ICD-10-CM

## 2017-05-25 MED ORDER — PANTOPRAZOLE SODIUM 40 MG PO TBEC: DELAYED_RELEASE_TABLET | ORAL | 1 refills | Status: DC

## 2017-05-25 NOTE — Patient Instructions (Signed)
Mammogram/Ultrasound 5/21 at 10 am at [facility]

## 2017-05-25 NOTE — Progress Notes (Signed)
[facility] Visit  Patient name: Tracey Daniel MRN [id number]  Date of birth: [birth year]  CC & HPI:  Tracey Daniel is a 37 y.o.  female presenting today for pap only (physical at PCP). Last pap 2016, normal w/+HPV. Letter was sent to repeat pap in one year (read by pt 11/04/15 at 11:59 am), but didn't come back until today. Doesn't have periods (premature ov failure, dx >13 years ago).  Says area on left breast (dx as gynecomastia last year) is bigger and now hurts  Pertinent History Reviewed:  Medical & Surgical Hx:   Past Medical History:  Diagnosis Date  . Anemia of other chronic disease 11/29/2012  . Asthma   . Back pain, chronic   . Chronic abdominal pain   . Constipation   . COPD (chronic obstructive pulmonary disease) ([place])   . Cyst of skin    mid spine area  . Depression 2000   h/o suicidal ideation  . Gastric outlet obstruction   . Gastritis   . Gastroparesis   . Migraine   . Migraines   . Nausea and vomiting    recurrent  . Nicotine addiction   . Osteoporosis   . Peptic ulcer disease   . Pyloric spasm 03/30/2011  . Seasonal allergies   . Sinusitis   . Substance abuse ([place]) 2008   marijuana  . Vitamin D deficiency    Past Surgical History:  Procedure Laterality Date  . BALLOON DILATION N/A 02/09/2013   Procedure: BALLOON DILATION;  Surgeon: Missy Sabins, MD;  [facility];  Service: Endoscopy;  Laterality: N/A;  . BOTOX INJECTION N/A 02/09/2013   Procedure: BOTOX INJECTION;  Surgeon: Missy Sabins, MD;  [facility];  Service: Endoscopy;  Laterality: N/A;  possible balloon  . BOTOX INJECTION N/A 10/24/2015   Procedure: BOTOX INJECTION;  Surgeon: Daneil Dolin, MD;  [facility];  Service: Endoscopy;  Laterality: N/A;  . CARPAL TUNNEL RELEASE     left hand  . CHOLECYSTECTOMY     ?2002  . COLONOSCOPY WITH PROPOFOL N/A 09/20/2014   RMR: Normal ileo-colonoscopy  . ESOPHAGEAL DILATION  12/25/2010   Procedure:  ESOPHAGEAL DILATION;  Surgeon: Dorothyann Peng, MD;  [facility];  Service: Endoscopy;;  . ESOPHAGOGASTRODUODENOSCOPY  12/25/2010   Dorothyann Peng, MD;  moderate gastritis, ?goo secondary to pylorspasm. BX showed reactive gstropathy no h.pyori, SB mucosa with intramucosal lymphocytosis and partial villous blunting (TTG 4.0 normal)  . ESOPHAGOGASTRODUODENOSCOPY N/A 11/14/2012   HYW:VPXTG hiatal hernia. Abnormal gastric mucosa of  uncertain significance-status post biopsy. Subjectively, patient may have recurrent symptomatic, pylorospasm.  . ESOPHAGOGASTRODUODENOSCOPY (EGD) WITH PROPOFOL N/A 02/09/2013   elongated stomach, partial lower esophageal ring widely patent. No obvious pyloric stenosis s/p Botox  . ESOPHAGOGASTRODUODENOSCOPY (EGD) WITH PROPOFOL N/A 10/24/2015   Procedure: ESOPHAGOGASTRODUODENOSCOPY (EGD) WITH PROPOFOL;  Surgeon: Daneil Dolin, MD;  [facility];  Service: Endoscopy;  Laterality: N/A;  830   . laser surgery on cervix    . NASAL SEPTUM SURGERY  01/29/2017  . TOE SURGERY     Family History  Problem Relation Age of Onset  . Diabetes Father   . Liver disease Father        liver transplant at [facility], age 76  . Lung cancer Mother 77  . Heart disease Maternal Grandmother   . Parkinson's disease Maternal Grandfather   . Multiple sclerosis Sister 68  . Depression Sister 51  .  Alcohol abuse Sister   . Bipolar disorder Sister   . Schizophrenia Sister   . Colon cancer Neg Hx     Current Outpatient Medications:  .  azelastine (ASTELIN) 0.1 % nasal spray, USE 2 SPRAYS IN EACH NOSTRIL TWICE DAILY AS DIRECTED, Disp: 30 mL, Rfl: 0 .  BIOTIN PO, Take 1 tablet by mouth daily., Disp: , Rfl:  .  calcium-vitamin D (OSCAL WITH D) 500-200 MG-UNIT per tablet, Take 1 tablet by mouth 2 (two) times daily., Disp: , Rfl:  .  chlorhexidine (PERIDEX) 0.12 % solution, 5 mLs by Mouth Rinse route daily., Disp: , Rfl: 2 .  clobetasol cream (TEMOVATE) 6.44 %, Apply 1 application  topically 2 (two) times daily as needed (rash). Apply to rash, Disp: , Rfl:  .  estradiol (ESTRACE) 1 MG tablet, Take 1 tablet (1 mg total) by mouth daily., Disp: 30 tablet, Rfl: 11 .  EVENING PRIMROSE OIL PO, Take by mouth 2 (two) times daily., Disp: , Rfl:  .  feeding supplement (ENSURE CLINICAL STRENGTH) LIQD, Take 237 mLs by mouth 3 (three) times daily with meals., Disp: 10 Bottle, Rfl: 3 .  fluticasone (FLONASE) 50 MCG/ACT nasal spray, SHAKE LIQUID AND USE 2 SPRAYS IN EACH NOSTRIL DAILY, Disp: 48 g, Rfl: 1 .  gabapentin (NEURONTIN) 100 MG capsule, Take 100 mg by mouth 3 (three) times daily., Disp: , Rfl:  .  LINZESS 290 MCG CAPS capsule, TAKE 1 CAPSULE(290 MCG) BY MOUTH DAILY, Disp: 30 capsule, Rfl: 11 .  loratadine (CLARITIN) 10 MG tablet, Take 1 tablet (10 mg total) by mouth daily., Disp: 30 tablet, Rfl: 5 .  ondansetron (ZOFRAN-ODT) 4 MG disintegrating tablet, DISSOLVE 1 TABLET ON THE TONGUE EVERY 8 HOURS AS NEEDED FOR NAUSEA, Disp: 30 tablet, Rfl: 1 .  pantoprazole (PROTONIX) 40 MG tablet, TAKE 1 TABLET(40 MG) BY MOUTH TWICE DAILY BEFORE A MEAL, Disp: 180 tablet, Rfl: 2 .  potassium chloride SA (K-DUR,KLOR-CON) 20 MEQ tablet, Take 1 tablet (20 mEq total) by mouth daily., Disp: 30 tablet, Rfl: 5 .  pravastatin (PRAVACHOL) 20 MG tablet, TAKE 1 TABLET(20 MG) BY MOUTH DAILY, Disp: 90 tablet, Rfl: 0 .  PREMARIN vaginal cream, INSERT 0.34 APPLICATORFUL VAGINALLY THREE TIMES WEEKLY AS DIRECTED, Disp: 30 g, Rfl: 2 .  progesterone (PROMETRIUM) 200 MG capsule, TAKE 1 CAPSULE BY MOUTH EVERY DAY FOR 2 WEEKS EVERY THIRD MONTH: JANUARY, APRIL, JULY AND OCTOBER, Disp: 14 capsule, Rfl: 1 .  propranolol (INDERAL) 10 MG tablet, Take 1 tablet (10 mg total) by mouth 3 (three) times daily., Disp: 90 tablet, Rfl: 2 .  risperiDONE (RISPERDAL) 0.5 MG tablet, Take 0.5 mg at bedtime by mouth., Disp: , Rfl:  .  sertraline (ZOLOFT) 50 MG tablet, Take 50 mg daily by mouth., Disp: , Rfl:  .  sucralfate (CARAFATE) 1 g  tablet, TAKE 1 TABLET BY MOUTH EVERY MORNING AND AT BEDTIME AS NEEDED FOR STOMACH BURNING.( MAY CRUSH 1 TABLET AND MIX IN APPLESAUCE), Disp: 180 tablet, Rfl: 0 .  SYMBICORT 80-4.5 MCG/ACT inhaler, INHALE 2 PUFFS INTO THE LUNGS TWICE DAILY, Disp: 10.2 g, Rfl: 5 .  tiZANidine (ZANAFLEX) 4 MG tablet, One tablet twice daily for back spasm (Patient taking differently: Take 4 mg by mouth 2 (two) times daily. ), Disp: 60 tablet, Rfl: 3 .  VENTOLIN HFA 108 (90 Base) MCG/ACT inhaler, INHALE 2 PUFFS INTO THE LUNGS EVERY 6 HOURS AS NEEDED FOR SHORTNESS OF BREATH, Disp: 18 g, Rfl: 0 .  zonisamide (ZONEGRAN) 50 MG  capsule, Take 150 mg by mouth daily., Disp: , Rfl: 2 .  pantoprazole (PROTONIX) 40 MG tablet, TAKE 1 TABLET BY MOUTH TWICE DAILY BEFORE A MEAL, Disp: 180 tablet, Rfl: 2 Social History: Reviewed -  reports that she has been smoking cigarettes.  She has a 1.50 pack-year smoking history. She has never used smokeless tobacco.  Review of Systems:   Constitutional: Negative for fever and chills Eyes: Negative for visual disturbances Respiratory: Negative for shortness of breath, dyspnea Cardiovascular: Negative for chest pain or palpitations  Gastrointestinal: Negative for vomiting, diarrhea and constipation; no abdominal pain Genitourinary: Negative for dysuria and urgency, vaginal irritation or itching Musculoskeletal: Negative for back pain, joint pain, myalgias  Neurological: Negative for dizziness and headaches    Objective Findings:    Physical Examination: Vitals:   05/25/17 1114  BP: (!) 100/54  Pulse: (!) 59   General appearance - well appearing, and in no distress Mental status - alert, oriented to person, place, and time Chest:  Normal respiratory effort Breast:  3cm firmer area on left breast above nipple, tender.  Similar, smaller area under right nipple Heart - normal rate and regular rhythm Abdomen:  Soft, nontender Pelvic: normal appearing vagina, normal DC, cx bulbous, non  friable Musculoskeletal:  Normal range of motion without pain Extremities:  No edema    No results found for this or any previous visit (from the past 24 hour(s)).    Assessment & Plan:  A:   Normal gyn exam  Gynecomastia, enlarging P:  If pap normal, r epeat in 3 years  Mammogram/US 5/21 at 10am   Return for If you have any problems.  Christin Fudge CNM 05/25/2017 12:01 PM

## 2017-05-26 ENCOUNTER — Emergency Department (HOSPITAL_COMMUNITY)
Admission: EM | Admit: 2017-05-26 | Discharge: 2017-05-27 | Disposition: A | Discharge status: Home / Self Care | Payer: Medicare HMO | Attending: Emergency Medicine | Admitting: Emergency Medicine

## 2017-05-26 ENCOUNTER — Other Ambulatory Visit: Payer: Self-pay

## 2017-05-26 DIAGNOSIS — R112 Nausea with vomiting, unspecified: Secondary | ICD-10-CM | POA: Insufficient documentation

## 2017-05-26 DIAGNOSIS — R1084 Generalized abdominal pain: Secondary | ICD-10-CM | POA: Insufficient documentation

## 2017-05-26 DIAGNOSIS — F17210 Nicotine dependence, cigarettes, uncomplicated: Secondary | ICD-10-CM | POA: Insufficient documentation

## 2017-05-26 DIAGNOSIS — M549 Dorsalgia, unspecified: Secondary | ICD-10-CM | POA: Insufficient documentation

## 2017-05-26 DIAGNOSIS — G8929 Other chronic pain: Secondary | ICD-10-CM | POA: Diagnosis not present

## 2017-05-26 DIAGNOSIS — Z79899 Other long term (current) drug therapy: Secondary | ICD-10-CM | POA: Insufficient documentation

## 2017-05-26 DIAGNOSIS — J449 Chronic obstructive pulmonary disease, unspecified: Secondary | ICD-10-CM | POA: Insufficient documentation

## 2017-05-26 LAB — I-STAT BETA HCG BLOOD, ED (MC, WL, AP ONLY): HCG, QUANTITATIVE: 11 m[IU]/mL — AB (ref <5)

## 2017-05-26 LAB — CYTOLOGY - PAP
CHLAMYDIA, DNA PROBE: NEGATIVE
DIAGNOSIS: NEGATIVE
HPV: DETECTED — AB
Neisseria Gonorrhea: NEGATIVE

## 2017-05-26 LAB — CBC
HEMATOCRIT: 35.9 % — AB (ref 36.0–46.0)
HEMOGLOBIN: 11.8 g/dL — AB (ref 12.0–15.0)
MCH: 29.6 pg (ref 26.0–34.0)
MCHC: 32.9 g/dL (ref 30.0–36.0)
MCV: 90.2 fL (ref 78.0–100.0)
Platelets: 229 10*3/uL (ref 150–400)
RBC: 3.98 MIL/uL (ref 3.87–5.11)
RDW: 12.7 % (ref 11.5–15.5)
WBC: 19.7 10*3/uL — ABNORMAL HIGH (ref 4.0–10.5)

## 2017-05-26 LAB — COMPREHENSIVE METABOLIC PANEL
ALBUMIN: 4.8 g/dL (ref 3.5–5.0)
ALK PHOS: 79 U/L (ref 38–126)
ALT: 25 U/L (ref 14–54)
ANION GAP: 15 (ref 5–15)
AST: 32 U/L (ref 15–41)
BILIRUBIN TOTAL: 0.5 mg/dL (ref 0.3–1.2)
BUN: 21 mg/dL — AB (ref 6–20)
CALCIUM: 10.4 mg/dL — AB (ref 8.9–10.3)
CO2: 18 mmol/L — AB (ref 22–32)
Chloride: 108 mmol/L (ref 101–111)
Creatinine, Ser: 0.94 mg/dL (ref 0.44–1.00)
GFR calc Af Amer: >60 mL/min (ref >60)
GFR calc non Af Amer: >60 mL/min (ref >60)
GLUCOSE: 175 mg/dL — AB (ref 65–99)
Potassium: 3.8 mmol/L (ref 3.5–5.1)
SODIUM: 141 mmol/L (ref 135–145)
Total Protein: 8.4 g/dL — ABNORMAL HIGH (ref 6.5–8.1)

## 2017-05-26 LAB — LIPASE, BLOOD: Lipase: 22 U/L (ref 11–51)

## 2017-05-26 MED ORDER — ONDANSETRON 4 MG PO TBDP
4.0000 mg | ORAL_TABLET | Freq: Once | ORAL | Status: AC | PRN
  Administered 2017-05-26: 4 mg via ORAL
  Filled 2017-05-26: qty 1

## 2017-05-26 MED ORDER — HALOPERIDOL LACTATE 5 MG/ML IJ SOLN
2.0000 mg | Freq: Once | INTRAMUSCULAR | Status: AC
  Administered 2017-05-27: 2 mg via INTRAVENOUS
  Filled 2017-05-26: qty 1

## 2017-05-26 MED ORDER — ONDANSETRON HCL 4 MG/2ML IJ SOLN
4.0000 mg | Freq: Once | INTRAMUSCULAR | Status: AC
  Administered 2017-05-27: 4 mg via INTRAVENOUS
  Filled 2017-05-26: qty 2

## 2017-05-26 MED ORDER — SODIUM CHLORIDE 0.9 % IV BOLUS
1000.0000 mL | Freq: Once | INTRAVENOUS | Status: AC
  Administered 2017-05-27: 1000 mL via INTRAVENOUS

## 2017-05-26 NOTE — ED Notes (Addendum)
Patient c/o central abdominal pain with N/V/D since 1200 today. Reports hx gastroparesis.

## 2017-05-26 NOTE — ED Triage Notes (Signed)
Pt c/o  constant nausea, vomiting and diarrhea for the last 8 hours

## 2017-05-26 NOTE — ED Provider Notes (Signed)
[facility] DEPT Provider Note   CSN: [id number] Arrival date & time: 05/26/17  1953     History   Chief Complaint No chief complaint on file.   HPI Tracey Daniel is a 37 y.o. female.  The history is provided by the patient and medical records.     37 y.o. F with hx of anemia, asthma, chronic abdominal pain, chronic back pain, COPD, depression, gastroparesis, substance abuse, presenting to the ED for nausea and vomiting.  States symptoms started at 1130 today.  Reports numerous episodes of nonbloody, nonbilious emesis.  Also reports some loose stools.  She has not had any fever or chills.  No sick contacts.  States pain is all over her abdomen but worse in the epigastrium.  No meds tried PTA.  Past Medical History:  Diagnosis Date  . Anemia of other chronic disease 11/29/2012  . Asthma   . Back pain, chronic   . Chronic abdominal pain   . Constipation   . COPD (chronic obstructive pulmonary disease) ([place])   . Cyst of skin    mid spine area  . Depression 2000   h/o suicidal ideation  . Gastric outlet obstruction   . Gastritis   . Gastroparesis   . Migraine   . Migraines   . Nausea and vomiting    recurrent  . Nicotine addiction   . Osteoporosis   . Peptic ulcer disease   . Pyloric spasm 03/30/2011  . Seasonal allergies   . Sinusitis   . Substance abuse ([place]) 2008   marijuana  . Vitamin D deficiency     Patient Active Problem List   Diagnosis Date Noted  . Hemorrhoids 02/22/2017  . Nicotine dependence, cigarettes, with other nicotine-induced disorders 12/12/2016  . Thoracic spine pain 11/23/2016  . Nasal obstruction 11/23/2016  . Chest pain 06/30/2016  . Hyperlipemia 10/02/2015  . Premature ovarian failure 02/04/2015  . Pregnancy as incidental finding, low positive preg test, considered pituitary production of HCG 02/04/2015  . Abdominal pain, epigastric 08/27/2014  . Vitamin D deficiency 05/16/2014  . Alopecia 08/01/2013    . Asthma, cough variant 12/01/2012  . 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 Marijuana use 11/14/2012  . Seasonal allergies 11/14/2012  . Nausea without vomiting 11/09/2012  . Constipation 12/02/2011  . Gastroparesis 05/01/2011  . Pyloric spasm 03/30/2011  . Hip pain, right 03/02/2011  . Anemia 01/30/2011  . Gastric outlet obstruction 01/01/2011  . GERD (gastroesophageal reflux disease) 08/21/2010  . Headache 03/13/2008  . AMENORRHEA, SECONDARY 09/27/2007  . ECZEMA 09/27/2007  . Depression 07/01/2007  . BACK PAIN, CHRONIC 07/01/2007  . CLOSED FRACTURE OF METATARSAL BONE 01/27/2007    Past Surgical History:  Procedure Laterality Date  . BALLOON DILATION N/A 02/09/2013   Procedure: BALLOON DILATION;  Surgeon: Missy Sabins, MD;  Location: [facility];  Service: Endoscopy;  Laterality: N/A;  . BOTOX INJECTION N/A 02/09/2013   Procedure: BOTOX INJECTION;  Surgeon: Missy Sabins, MD;  Location: [facility];  Service: Endoscopy;  Laterality: N/A;  possible balloon  . BOTOX INJECTION N/A 10/24/2015   Procedure: BOTOX INJECTION;  Surgeon: Daneil Dolin, MD;  Location: [facility];  Service: Endoscopy;  Laterality: N/A;  . CARPAL TUNNEL RELEASE     left hand  . CHOLECYSTECTOMY     ?2002  . COLONOSCOPY WITH PROPOFOL N/A 09/20/2014   RMR: Normal ileo-colonoscopy  . ESOPHAGEAL DILATION  12/25/2010   Procedure: ESOPHAGEAL DILATION;  Surgeon: Hope Pigeon  Fields, MD;  Location: [facility];  Service: Endoscopy;;  . ESOPHAGOGASTRODUODENOSCOPY  12/25/2010   Dorothyann Peng, MD;  moderate gastritis, ?goo secondary to pylorspasm. BX showed reactive gstropathy no h.pyori, SB mucosa with intramucosal lymphocytosis and partial villous blunting (TTG 4.0 normal)  . ESOPHAGOGASTRODUODENOSCOPY N/A 11/14/2012   DXI:PJASN hiatal hernia. Abnormal gastric mucosa of  uncertain significance-status post biopsy. Subjectively, patient may have recurrent symptomatic, pylorospasm.  .  ESOPHAGOGASTRODUODENOSCOPY (EGD) WITH PROPOFOL N/A 02/09/2013   elongated stomach, partial lower esophageal ring widely patent. No obvious pyloric stenosis s/p Botox  . ESOPHAGOGASTRODUODENOSCOPY (EGD) WITH PROPOFOL N/A 10/24/2015   Procedure: ESOPHAGOGASTRODUODENOSCOPY (EGD) WITH PROPOFOL;  Surgeon: Daneil Dolin, MD;  [facility];  Service: Endoscopy;  Laterality: N/A;  830   . laser surgery on cervix    . NASAL SEPTUM SURGERY  01/29/2017  . TOE SURGERY       OB History    Gravida  0   Para      Term      Preterm      AB      Living        SAB      TAB      Ectopic      Multiple      Live Births               Home Medications    Prior to Admission medications   Medication Sig Start Date End Date Taking? Authorizing Provider  azelastine (ASTELIN) 0.1 % nasal spray USE 2 SPRAYS IN EACH NOSTRIL TWICE DAILY AS DIRECTED 05/10/17   [name], MD  BIOTIN PO Take 1 tablet by mouth daily.    [provider]  calcium-vitamin D (OSCAL WITH D) 500-200 MG-UNIT per tablet Take 1 tablet by mouth 2 (two) times daily.    [provider]  chlorhexidine (PERIDEX) 0.12 % solution 5 mLs by Mouth Rinse route daily. 05/22/16   [provider]  clobetasol cream (TEMOVATE) 0.53 % Apply 1 application topically 2 (two) times daily as needed (rash). Apply to rash    [provider]  estradiol (ESTRACE) 1 MG tablet Take 1 tablet (1 mg total) by mouth daily. 11/03/16 11/03/17  Jonnie Kind, MD  EVENING PRIMROSE OIL PO Take by mouth 2 (two) times daily.    [provider]  feeding supplement (ENSURE CLINICAL STRENGTH) LIQD Take 237 mLs by mouth 3 (three) times daily with meals. 01/01/11   Dhungel, Flonnie Overman, MD  fluticasone (FLONASE) 50 MCG/ACT nasal spray SHAKE LIQUID AND USE 2 SPRAYS IN EACH NOSTRIL DAILY 02/04/17   [name], MD  gabapentin (NEURONTIN) 100 MG capsule Take 100 mg by mouth 3 (three) times daily.     [provider]  LINZESS 290 MCG CAPS capsule TAKE 1 CAPSULE(290 MCG) BY MOUTH DAILY 07/23/16   Mahala Menghini, PA-C  loratadine (CLARITIN) 10 MG tablet Take 1 tablet (10 mg total) by mouth daily. 10/09/11   [name], MD  ondansetron (ZOFRAN-ODT) 4 MG disintegrating tablet DISSOLVE 1 TABLET ON THE TONGUE EVERY 8 HOURS AS NEEDED FOR NAUSEA 12/28/16   Annitta Needs, NP  pantoprazole (PROTONIX) 40 MG tablet TAKE 1 TABLET BY MOUTH TWICE DAILY BEFORE A MEAL 05/25/17   Carlis Stable, NP  pantoprazole (PROTONIX) 40 MG tablet TAKE 1 TABLET(40 MG) BY MOUTH TWICE DAILY BEFORE A MEAL 05/25/17   Annitta Needs, NP  potassium chloride SA (K-DUR,KLOR-CON) 20  MEQ tablet Take 1 tablet (20 mEq total) by mouth daily. 02/18/17   [name], MD  pravastatin (PRAVACHOL) 20 MG tablet TAKE 1 TABLET(20 MG) BY MOUTH DAILY 05/05/17   [name], MD  PREMARIN vaginal cream INSERT 9.38 APPLICATORFUL VAGINALLY THREE TIMES WEEKLY AS DIRECTED 12/15/16   Jonnie Kind, MD  progesterone (PROMETRIUM) 200 MG capsule TAKE 1 CAPSULE BY MOUTH EVERY DAY FOR 2 WEEKS EVERY THIRD MONTH: Quentin Mulling AND OCTOBER 04/27/17   Jonnie Kind, MD  propranolol (INDERAL) 10 MG tablet Take 1 tablet (10 mg total) by mouth 3 (three) times daily. 02/18/17   [name], MD  risperiDONE (RISPERDAL) 0.5 MG tablet Take 0.5 mg at bedtime by mouth.    [provider]  sertraline (ZOLOFT) 50 MG tablet Take 50 mg daily by mouth.    [provider]  sucralfate (CARAFATE) 1 g tablet TAKE 1 TABLET BY MOUTH EVERY MORNING AND AT BEDTIME AS NEEDED FOR STOMACH BURNING.( MAY CRUSH 1 TABLET AND MIX IN APPLESAUCE) 05/06/17   Annitta Needs, NP  SYMBICORT 80-4.5 MCG/ACT inhaler INHALE 2 PUFFS INTO THE LUNGS TWICE DAILY 01/13/17   [name], MD  tiZANidine (ZANAFLEX) 4 MG tablet One tablet twice daily for back spasm Patient taking differently: Take 4 mg by mouth 2 (two) times daily.  02/18/17    [name], MD  VENTOLIN HFA 108 [phone] Base) MCG/ACT inhaler INHALE 2 PUFFS INTO THE LUNGS EVERY 6 HOURS AS NEEDED FOR SHORTNESS OF BREATH 05/10/17   [name], MD  zonisamide (ZONEGRAN) 50 MG capsule Take 150 mg by mouth daily. 02/21/17   [provider]    Family History Family History  Problem Relation Age of Onset  . Diabetes Father   . Liver disease Father        liver transplant at [facility], age 57  . Lung cancer Mother 68  . Heart disease Maternal Grandmother   . Parkinson's disease Maternal Grandfather   . Multiple sclerosis Sister 37  . Depression Sister 76  . Alcohol abuse Sister   . Bipolar disorder Sister   . Schizophrenia Sister   . Colon cancer Neg Hx     Social History Social History   Tobacco Use  . Smoking status: Current Every Day Smoker    Packs/day: 0.10    Years: 15.00    Pack years: 1.50    Types: Cigarettes  . Smokeless tobacco: Never Used  . Tobacco comment: 2 per day  Substance Use Topics  . Alcohol use: No    Alcohol/week: 0.0 oz  . Drug use: No     Allergies   Benadryl [diphenhydramine]; Latex; and Penicillins   Review of Systems Review of Systems  Gastrointestinal: Positive for abdominal pain, nausea and vomiting.  All other systems reviewed and are negative.    Physical Exam Updated Vital Signs BP 122/88 (BP Location: Left Arm)   Pulse 85   Temp (!) 97.5 F (36.4 C) (Oral)   Resp 20   Ht 5\' 6"  (1.676 m)   Wt 67.1 kg (148 lb)   SpO2 100%   BMI 23.89 kg/m   Physical Exam  Constitutional: She is oriented to person, place, and time. She appears well-developed and well-nourished.  Moaning, yelling, thrashing around in bed  HENT:  Head: Normocephalic and atraumatic.  Mouth/Throat: Oropharynx is clear and moist.  Eyes: Pupils are equal, round, and reactive to light. Conjunctivae and EOM are normal.  Neck: Normal range of motion.  Cardiovascular: Normal rate, regular rhythm and normal heart sounds.    Pulmonary/Chest: Effort normal and breath sounds normal.  Abdominal: Soft. Bowel sounds are normal. There is no tenderness. There is no rigidity and no guarding.  Soft, non-tender  Musculoskeletal: Normal range of motion.  Neurological: She is alert and oriented to person, place, and time.  Skin: Skin is warm and dry.  Psychiatric: She has a normal mood and affect.  Nursing note and vitals reviewed.    ED Treatments / Results  Labs (all labs ordered are listed, but only abnormal results are displayed) Labs Reviewed  COMPREHENSIVE METABOLIC PANEL - Abnormal; Notable for the following components:      Result Value   CO2 18 (*)    Glucose, Bld 175 (*)    BUN 21 (*)    Calcium 10.4 (*)    Total Protein 8.4 (*)    All other components within normal limits  CBC - Abnormal; Notable for the following components:   WBC 19.7 (*)    Hemoglobin 11.8 (*)    HCT 35.9 (*)    All other components within normal limits  I-STAT BETA HCG BLOOD, ED ([facility], [facility], [facility] ONLY) - Abnormal; Notable for the following components:   I-stat hCG, quantitative 11.0 (*)    All other components within normal limits  LIPASE, BLOOD  URINALYSIS, ROUTINE W REFLEX MICROSCOPIC    EKG None  Radiology No results found.  Procedures Procedures (including critical care time)  Medications Ordered in ED Medications  sodium chloride 0.9 % bolus 1,000 mL (has no administration in time range)  ondansetron (ZOFRAN) injection 4 mg (has no administration in time range)  haloperidol lactate (HALDOL) injection 2 mg (has no administration in time range)  ondansetron (ZOFRAN-ODT) disintegrating tablet 4 mg (4 mg Oral Given 05/26/17 2014)     Initial Impression / Assessment and Plan / ED Course  I have reviewed the triage vital signs and the nursing notes.  Pertinent labs & imaging results that were available during my care of the patient were reviewed by me and considered in my medical decision making (see chart for  details).  37 year old female presenting to the ED with abdominal pain, nausea, and vomiting.  Has history of gastroparesis.  She is afebrile and nontoxic.  She is somewhat dramatic during exam, yelling, moaning, and thrashing around the bed.  Her abdomen is soft and I do not appreciate any focal tenderness.  Screening labs overall reassuring, does have leukocytosis but has had similar values in the past.  Patient treated here with IV fluids, Haldol, and Zofran, has been resting comfortably.  She has not had any active emesis here in the emergency department.  Suspect recurrent gastroparesis.  She also has history of marijuana abuse, this may be contributing as well.  We will have her follow-up closely with primary care.  Discussed plan with patient, she acknowledged understanding and agreed with plan of care.  Return precautions given for new or worsening symptoms.  Final Clinical Impressions(s) / ED Diagnoses   Final diagnoses:  Generalized abdominal pain  Non-intractable vomiting with nausea, unspecified vomiting type    ED Discharge Orders        Ordered    ondansetron (ZOFRAN ODT) 4 MG disintegrating tablet  Every 8 hours PRN     05/27/17 0405    dicyclomine (BENTYL) 20 MG tablet  2 times daily     05/27/17 0409  Larene Pickett, PA-C 05/27/17 1165    Jola Schmidt, MD 05/28/17 [phone]

## 2017-05-27 ENCOUNTER — Other Ambulatory Visit: Payer: Self-pay | Admitting: Obstetrics and Gynecology

## 2017-05-27 DIAGNOSIS — R1084 Generalized abdominal pain: Secondary | ICD-10-CM | POA: Diagnosis not present

## 2017-05-27 LAB — URINALYSIS, ROUTINE W REFLEX MICROSCOPIC
BILIRUBIN URINE: NEGATIVE
Bacteria, UA: NONE SEEN
GLUCOSE, UA: 50 mg/dL — AB
HGB URINE DIPSTICK: NEGATIVE
KETONES UR: 5 mg/dL — AB
LEUKOCYTES UA: NEGATIVE
NITRITE: NEGATIVE
PH: 8 (ref 5.0–8.0)
PROTEIN: 30 mg/dL — AB
Specific Gravity, Urine: 1.02 (ref 1.005–1.030)

## 2017-05-27 MED ORDER — ONDANSETRON 4 MG PO TBDP: 4.0000 mg | ORAL_TABLET | Freq: Three times a day (TID) | ORAL | 0 refills | Status: DC | PRN

## 2017-05-27 MED ORDER — DICYCLOMINE HCL 20 MG PO TABS: 20.0000 mg | ORAL_TABLET | Freq: Two times a day (BID) | ORAL | 0 refills | Status: DC

## 2017-05-27 MED ORDER — DICYCLOMINE HCL 10 MG PO CAPS
20.0000 mg | ORAL_CAPSULE | Freq: Once | ORAL | Status: AC
  Administered 2017-05-27: 20 mg via ORAL
  Filled 2017-05-27: qty 2

## 2017-05-27 NOTE — Discharge Instructions (Signed)
Take the prescribed medication as directed for nausea.  Gentle diet for now, lots of fluids.  Progress diet back to normal as tolerated. Follow-up with your primary care doctor. Return to the ED for new or worsening symptoms.

## 2017-05-30 NOTE — Telephone Encounter (Signed)
refil prometrium for q 3 month w/drawal. Had appt 05/25/17

## 2017-06-01 ENCOUNTER — Other Ambulatory Visit: Payer: Self-pay | Admitting: Family Medicine

## 2017-06-02 ENCOUNTER — Encounter: Payer: Self-pay | Admitting: Advanced Practice Midwife

## 2017-06-02 NOTE — Progress Notes (Signed)
HPV for 2nd time. cOLPO  Letter sent

## 2017-06-08 ENCOUNTER — Ambulatory Visit (HOSPITAL_COMMUNITY)
Admission: RE | Admit: 2017-06-08 | Discharge: 2017-06-08 | Disposition: A | Discharge status: Home / Self Care | Payer: Medicare HMO | Source: Ambulatory Visit | Attending: Advanced Practice Midwife | Admitting: Advanced Practice Midwife

## 2017-06-08 ENCOUNTER — Encounter (HOSPITAL_COMMUNITY): Payer: Self-pay

## 2017-06-08 DIAGNOSIS — N62 Hypertrophy of breast: Secondary | ICD-10-CM

## 2017-06-08 DIAGNOSIS — N644 Mastodynia: Secondary | ICD-10-CM | POA: Diagnosis not present

## 2017-06-08 DIAGNOSIS — R922 Inconclusive mammogram: Secondary | ICD-10-CM | POA: Diagnosis not present

## 2017-06-09 ENCOUNTER — Other Ambulatory Visit (HOSPITAL_COMMUNITY): Payer: Self-pay

## 2017-06-09 DIAGNOSIS — D649 Anemia, unspecified: Secondary | ICD-10-CM

## 2017-06-10 ENCOUNTER — Inpatient Hospital Stay (HOSPITAL_COMMUNITY): Payer: Medicare HMO | Attending: Hematology

## 2017-06-10 ENCOUNTER — Ambulatory Visit (HOSPITAL_COMMUNITY): Payer: Medicare HMO | Admitting: Internal Medicine

## 2017-06-10 DIAGNOSIS — J449 Chronic obstructive pulmonary disease, unspecified: Secondary | ICD-10-CM | POA: Insufficient documentation

## 2017-06-10 DIAGNOSIS — F329 Major depressive disorder, single episode, unspecified: Secondary | ICD-10-CM | POA: Insufficient documentation

## 2017-06-10 DIAGNOSIS — D649 Anemia, unspecified: Secondary | ICD-10-CM

## 2017-06-10 DIAGNOSIS — K219 Gastro-esophageal reflux disease without esophagitis: Secondary | ICD-10-CM | POA: Diagnosis not present

## 2017-06-10 DIAGNOSIS — E785 Hyperlipidemia, unspecified: Secondary | ICD-10-CM | POA: Insufficient documentation

## 2017-06-10 DIAGNOSIS — E559 Vitamin D deficiency, unspecified: Secondary | ICD-10-CM | POA: Insufficient documentation

## 2017-06-10 DIAGNOSIS — K449 Diaphragmatic hernia without obstruction or gangrene: Secondary | ICD-10-CM | POA: Diagnosis not present

## 2017-06-10 DIAGNOSIS — F17210 Nicotine dependence, cigarettes, uncomplicated: Secondary | ICD-10-CM | POA: Diagnosis not present

## 2017-06-10 DIAGNOSIS — D500 Iron deficiency anemia secondary to blood loss (chronic): Secondary | ICD-10-CM | POA: Insufficient documentation

## 2017-06-10 DIAGNOSIS — Z79899 Other long term (current) drug therapy: Secondary | ICD-10-CM | POA: Insufficient documentation

## 2017-06-10 DIAGNOSIS — K3184 Gastroparesis: Secondary | ICD-10-CM | POA: Diagnosis not present

## 2017-06-10 DIAGNOSIS — F1290 Cannabis use, unspecified, uncomplicated: Secondary | ICD-10-CM | POA: Diagnosis not present

## 2017-06-10 DIAGNOSIS — E2839 Other primary ovarian failure: Secondary | ICD-10-CM | POA: Diagnosis not present

## 2017-06-10 LAB — CBC WITH DIFFERENTIAL/PLATELET
Basophils Absolute: 0 10*3/uL (ref 0.0–0.1)
Basophils Relative: 0 %
Eosinophils Absolute: 0.2 10*3/uL (ref 0.0–0.7)
Eosinophils Relative: 2 %
HCT: 36.2 % (ref 36.0–46.0)
HEMOGLOBIN: 11.7 g/dL — AB (ref 12.0–15.0)
LYMPHS ABS: 2.4 10*3/uL (ref 0.7–4.0)
LYMPHS PCT: 25 %
MCH: 30 pg (ref 26.0–34.0)
MCHC: 32.3 g/dL (ref 30.0–36.0)
MCV: 92.8 fL (ref 78.0–100.0)
MONOS PCT: 5 %
Monocytes Absolute: 0.5 10*3/uL (ref 0.1–1.0)
NEUTROS PCT: 68 %
Neutro Abs: 6.7 10*3/uL (ref 1.7–7.7)
Platelets: 210 10*3/uL (ref 150–400)
RBC: 3.9 MIL/uL (ref 3.87–5.11)
RDW: 13.7 % (ref 11.5–15.5)
WBC: 9.8 10*3/uL (ref 4.0–10.5)

## 2017-06-10 LAB — COMPREHENSIVE METABOLIC PANEL
ALK PHOS: 67 U/L (ref 38–126)
ALT: 19 U/L (ref 14–54)
ANION GAP: 7 (ref 5–15)
AST: 19 U/L (ref 15–41)
Albumin: 4.3 g/dL (ref 3.5–5.0)
BILIRUBIN TOTAL: 0.5 mg/dL (ref 0.3–1.2)
BUN: 22 mg/dL — ABNORMAL HIGH (ref 6–20)
CALCIUM: 9.7 mg/dL (ref 8.9–10.3)
CO2: 27 mmol/L (ref 22–32)
CREATININE: 1.02 mg/dL — AB (ref 0.44–1.00)
Chloride: 106 mmol/L (ref 101–111)
GFR calc Af Amer: >60 mL/min (ref >60)
GFR calc non Af Amer: >60 mL/min (ref >60)
GLUCOSE: 80 mg/dL (ref 65–99)
Potassium: 4.6 mmol/L (ref 3.5–5.1)
Sodium: 140 mmol/L (ref 135–145)
Total Protein: 7.5 g/dL (ref 6.5–8.1)

## 2017-06-10 LAB — IRON AND TIBC
Iron: 38 ug/dL (ref 28–170)
SATURATION RATIOS: 14 % (ref 10.4–31.8)
TIBC: 277 ug/dL (ref 250–450)
UIBC: 239 ug/dL

## 2017-06-10 LAB — FERRITIN: Ferritin: 121 ng/mL (ref 11–307)

## 2017-06-17 ENCOUNTER — Other Ambulatory Visit: Payer: Self-pay | Admitting: Family Medicine

## 2017-06-17 ENCOUNTER — Encounter (HOSPITAL_COMMUNITY): Payer: Self-pay | Admitting: Internal Medicine

## 2017-06-17 ENCOUNTER — Inpatient Hospital Stay (HOSPITAL_BASED_OUTPATIENT_CLINIC_OR_DEPARTMENT_OTHER): Payer: Medicare HMO | Admitting: Internal Medicine

## 2017-06-17 VITALS — BP 110/70 | HR 58 | Temp 97.9°F | Resp 14 | Wt 144.7 lb

## 2017-06-17 DIAGNOSIS — J449 Chronic obstructive pulmonary disease, unspecified: Secondary | ICD-10-CM | POA: Diagnosis not present

## 2017-06-17 DIAGNOSIS — F17210 Nicotine dependence, cigarettes, uncomplicated: Secondary | ICD-10-CM | POA: Diagnosis not present

## 2017-06-17 DIAGNOSIS — K449 Diaphragmatic hernia without obstruction or gangrene: Secondary | ICD-10-CM | POA: Diagnosis not present

## 2017-06-17 DIAGNOSIS — E559 Vitamin D deficiency, unspecified: Secondary | ICD-10-CM | POA: Diagnosis not present

## 2017-06-17 DIAGNOSIS — E785 Hyperlipidemia, unspecified: Secondary | ICD-10-CM | POA: Diagnosis not present

## 2017-06-17 DIAGNOSIS — D500 Iron deficiency anemia secondary to blood loss (chronic): Secondary | ICD-10-CM | POA: Diagnosis not present

## 2017-06-17 DIAGNOSIS — K219 Gastro-esophageal reflux disease without esophagitis: Secondary | ICD-10-CM | POA: Diagnosis not present

## 2017-06-17 DIAGNOSIS — Z79899 Other long term (current) drug therapy: Secondary | ICD-10-CM

## 2017-06-17 DIAGNOSIS — K3184 Gastroparesis: Secondary | ICD-10-CM | POA: Diagnosis not present

## 2017-06-17 DIAGNOSIS — F17218 Nicotine dependence, cigarettes, with other nicotine-induced disorders: Secondary | ICD-10-CM

## 2017-06-17 NOTE — Patient Instructions (Signed)
[facility] at [facility] Discharge Instructions  You saw Dr. Higgs today.   Thank you for choosing [facility] at [facility] to provide your oncology and hematology care.  To afford each patient quality time with our provider, please arrive at least 15 minutes before your scheduled appointment time.   If you have a lab appointment with the Cancer Center please come in thru the  Main Entrance and check in at the main information desk  You need to re-schedule your appointment should you arrive 10 or more minutes late.  We strive to give you quality time with our providers, and arriving late affects you and other patients whose appointments are after yours.  Also, if you no show three or more times for appointments you may be dismissed from the clinic at the providers discretion.     Again, thank you for choosing [facility].  Our hope is that these requests will decrease the amount of time that you wait before being seen by our physicians.       _____________________________________________________________  Should you have questions after your visit to [facility], please contact our office at [phone] between the hours of 8:30 a.m. and 4:30 p.m.  Voicemails left after 4:30 p.m. will not be returned until the following business day.  For prescription refill requests, have your pharmacy contact our office.       Resources For Cancer Patients and their Caregivers ? American Cancer Society: Can assist with transportation, wigs, general needs, runs Look Good Feel Better.        1-888-227-6333 ? Cancer Care: Provides financial assistance, online support groups, medication/co-pay assistance.  1-800-813-HOPE (4673) ? [facility] Assists [place] cancer patients and their families through emotional , educational and financial support.  336-427-4357 ? [place] DSS Where to apply for food  stamps, Medicaid and utility assistance. 336-342-1394 ? [facility]: Transportation to medical appointments. 336-347-2287 ? Social Security Administration: May apply for disability if have a Stage IV cancer. 336-342-7796 1-800-772-1213 ? [place] Aging, Disability and Transit Services: Assists with nutrition, care and transit needs. 336-349-2343  Cancer Center Support Programs:   > Cancer Support Group  2nd Tuesday of the month 1pm-2pm, Journey Room   > Creative Journey  3rd Tuesday of the month 1130am-1pm, Journey Room     

## 2017-06-17 NOTE — Progress Notes (Signed)
Diagnosis Iron deficiency anemia due to chronic blood loss - Plan: CT Chest Wo Contrast, CBC with Differential/Platelet, Comprehensive metabolic panel, Lactate dehydrogenase, Ferritin  Cigarette nicotine dependence with other nicotine-induced disorder - Plan: CT Chest Wo Contrast, CBC with Differential/Platelet, Comprehensive metabolic panel, Lactate dehydrogenase, Ferritin  Staging Cancer Staging No matching staging information was found for the patient.  Assessment and Plan:  1.  IDA.  37 yr old female previously on p.o. iron replacement with ferrous sulfate with improved hemoglobin but with persistent nausea.  Ferrous sulfate was discontinued in November 2016 with improvement in nausea.  She was last treated with IV iron on 06/09/2016.     She is status post EGD on 10/24/2015 with Dr. Gala Romney which demonstrated normal esophagus, diffuse erythematous mucosa in the gastric body, and narrowed pyloric channel.  The pylorus was injected in 4 quadrants with 25 units of Botox to each quadrant.    She is here today for follow-up.  Labs done 06/10/2017 reviewed with pt show WBC 9.8 HB 11.7 plts 210,000 with ferritin 121.  Counts remain stable after last IV iron in 2018.  She will RTC in 1 yr for follow-up with repeat labs.    2.  Smoking.  Cessation is recommended.  She will be set up for CT chest without contrast due to long smoking history.    3.  Health maintenance.  Pt should follow-up with GI as recommended.  She should continue mammogram screening as recommended.     Current Status:  Pt is seen today for follow-up.  When questioned, she continues to smoke.     Problem List Patient Active Problem List   Diagnosis Date Noted  . Hemorrhoids [K64.9] 02/22/2017  . Nicotine dependence, cigarettes, with other nicotine-induced disorders [F17.218] 12/12/2016  . Thoracic spine pain [M54.6] 11/23/2016  . Nasal obstruction [J34.89] 11/23/2016  . Chest pain [R07.9] 06/30/2016  . Hyperlipemia [E78.5]  10/02/2015  . Premature ovarian failure [E28.8] 02/04/2015  . Pregnancy as incidental finding, low positive preg test, considered pituitary production of HCG [Z33.1] 02/04/2015  . Abdominal pain, epigastric [R10.13] 08/27/2014  . Vitamin D deficiency [E55.9] 05/16/2014  . Alopecia [L65.9] 08/01/2013  . Asthma, cough variant [J45.991] 12/01/2012  . 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 Marijuana use [F12.90] 11/14/2012  . Seasonal allergies [J30.2] 11/14/2012  . Nausea without vomiting [R11.0] 11/09/2012  . Constipation [K59.00] 12/02/2011  . Gastroparesis [K31.84] 05/01/2011  . Pyloric spasm [K31.3] 03/30/2011  . Hip pain, right [M25.551] 03/02/2011  . Anemia [D64.9] 01/30/2011  . Gastric outlet obstruction [K31.1] 01/01/2011  . GERD (gastroesophageal reflux disease) [K21.9] 08/21/2010  . Headache [R51] 03/13/2008  . AMENORRHEA, SECONDARY [N91.2] 09/27/2007  . ECZEMA [L25.9] 09/27/2007  . Depression [F32.9] 07/01/2007  . BACK PAIN, CHRONIC [M54.9] 07/01/2007  . CLOSED FRACTURE OF METATARSAL BONE [S92.309A] 01/27/2007    Past Medical History Past Medical History:  Diagnosis Date  . Anemia of other chronic disease 11/29/2012  . Asthma   . Back pain, chronic   . Chronic abdominal pain   . Constipation   . COPD (chronic obstructive pulmonary disease) ([place])   . Cyst of skin    mid spine area  . Depression 2000   h/o suicidal ideation  . Gastric outlet obstruction   . Gastritis   . Gastroparesis   . Migraine   . Migraines   . Nausea and vomiting    recurrent  . Nicotine addiction   . Osteoporosis   . Peptic ulcer disease   .  Pyloric spasm 03/30/2011  . Seasonal allergies   . Sinusitis   . Substance abuse ([place]) 2008   marijuana  . Vitamin D deficiency     Past Surgical History Past Surgical History:  Procedure Laterality Date  . BALLOON DILATION N/A 02/09/2013   Procedure: BALLOON DILATION;  Surgeon: Missy Sabins, MD;  Location:  [facility];  Service: Endoscopy;  Laterality: N/A;  . BOTOX INJECTION N/A 02/09/2013   Procedure: BOTOX INJECTION;  Surgeon: Missy Sabins, MD;  Location: [facility];  Service: Endoscopy;  Laterality: N/A;  possible balloon  . BOTOX INJECTION N/A 10/24/2015   Procedure: BOTOX INJECTION;  Surgeon: Daneil Dolin, MD;  Location: [facility];  Service: Endoscopy;  Laterality: N/A;  . CARPAL TUNNEL RELEASE     left hand  . CHOLECYSTECTOMY     ?2002  . COLONOSCOPY WITH PROPOFOL N/A 09/20/2014   RMR: Normal ileo-colonoscopy  . ESOPHAGEAL DILATION  12/25/2010   Procedure: ESOPHAGEAL DILATION;  Surgeon: Dorothyann Peng, MD;  Location: [facility];  Service: Endoscopy;;  . ESOPHAGOGASTRODUODENOSCOPY  12/25/2010   Dorothyann Peng, MD;  moderate gastritis, ?goo secondary to pylorspasm. BX showed reactive gstropathy no h.pyori, SB mucosa with intramucosal lymphocytosis and partial villous blunting (TTG 4.0 normal)  . ESOPHAGOGASTRODUODENOSCOPY N/A 11/14/2012   BJS:EGBTD hiatal hernia. Abnormal gastric mucosa of  uncertain significance-status post biopsy. Subjectively, patient may have recurrent symptomatic, pylorospasm.  . ESOPHAGOGASTRODUODENOSCOPY (EGD) WITH PROPOFOL N/A 02/09/2013   elongated stomach, partial lower esophageal ring widely patent. No obvious pyloric stenosis s/p Botox  . ESOPHAGOGASTRODUODENOSCOPY (EGD) WITH PROPOFOL N/A 10/24/2015   Procedure: ESOPHAGOGASTRODUODENOSCOPY (EGD) WITH PROPOFOL;  Surgeon: Daneil Dolin, MD;  Location: [facility];  Service: Endoscopy;  Laterality: N/A;  830   . laser surgery on cervix    . NASAL SEPTUM SURGERY  01/29/2017  . TOE SURGERY      Family History Family History  Problem Relation Age of Onset  . Diabetes Father   . Liver disease Father        liver transplant at [facility], age 8  . Lung cancer Mother 95  . Heart disease Maternal Grandmother   . Parkinson's disease Maternal Grandfather   . Multiple sclerosis Sister 59  . Depression Sister 72   . Alcohol abuse Sister   . Bipolar disorder Sister   . Schizophrenia Sister   . Colon cancer Neg Hx      Social History  reports that she has been smoking cigarettes.  She has a 1.50 pack-year smoking history. She has never used smokeless tobacco. She reports that she does not drink alcohol or use drugs.  Medications  Current Outpatient Medications:  .  albuterol (PROVENTIL HFA;VENTOLIN HFA) 108 (90 Base) MCG/ACT inhaler, INHALE 2 PUFFS INTO THE LUNGS EVERY 6 HOURS AS NEEDED FOR SHORTNESS OF BREATH, Disp: 18 g, Rfl: 1 .  azelastine (ASTELIN) 0.1 % nasal spray, USE 2 SPRAYS IN EACH NOSTRIL TWICE DAILY AS DIRECTED, Disp: 30 mL, Rfl: 1 .  BIOTIN PO, Take 1 tablet by mouth daily., Disp: , Rfl:  .  calcium-vitamin D (OSCAL WITH D) 500-200 MG-UNIT per tablet, Take 1 tablet by mouth 2 (two) times daily., Disp: , Rfl:  .  chlorhexidine (PERIDEX) 0.12 % solution, 5 mLs by Mouth Rinse route daily., Disp: , Rfl: 2 .  clobetasol cream (TEMOVATE) 1.76 %, Apply 1 application topically 2 (two) times daily as needed (rash). Apply to rash, Disp: , Rfl:  .  dicyclomine (  BENTYL) 20 MG tablet, Take 1 tablet (20 mg total) by mouth 2 (two) times daily., Disp: 20 tablet, Rfl: 0 .  estradiol (ESTRACE) 1 MG tablet, Take 1 tablet (1 mg total) by mouth daily., Disp: 30 tablet, Rfl: 11 .  EVENING PRIMROSE OIL PO, Take by mouth 2 (two) times daily., Disp: , Rfl:  .  feeding supplement (ENSURE CLINICAL STRENGTH) LIQD, Take 237 mLs by mouth 3 (three) times daily with meals., Disp: 10 Bottle, Rfl: 3 .  fluticasone (FLONASE) 50 MCG/ACT nasal spray, SHAKE LIQUID AND USE 2 SPRAYS IN EACH NOSTRIL DAILY, Disp: 48 g, Rfl: 1 .  gabapentin (NEURONTIN) 100 MG capsule, Take 100 mg by mouth 3 (three) times daily., Disp: , Rfl:  .  LINZESS 290 MCG CAPS capsule, TAKE 1 CAPSULE(290 MCG) BY MOUTH DAILY, Disp: 30 capsule, Rfl: 11 .  loratadine (CLARITIN) 10 MG tablet, Take 1 tablet (10 mg total) by mouth daily., Disp: 30 tablet, Rfl:  5 .  ondansetron (ZOFRAN ODT) 4 MG disintegrating tablet, Take 1 tablet (4 mg total) by mouth every 8 (eight) hours as needed for nausea., Disp: 10 tablet, Rfl: 0 .  pantoprazole (PROTONIX) 40 MG tablet, TAKE 1 TABLET BY MOUTH TWICE DAILY BEFORE A MEAL, Disp: 180 tablet, Rfl: 2 .  pantoprazole (PROTONIX) 40 MG tablet, TAKE 1 TABLET(40 MG) BY MOUTH TWICE DAILY BEFORE A MEAL, Disp: 180 tablet, Rfl: 1 .  potassium chloride SA (K-DUR,KLOR-CON) 20 MEQ tablet, Take 1 tablet (20 mEq total) by mouth daily., Disp: 30 tablet, Rfl: 5 .  pravastatin (PRAVACHOL) 20 MG tablet, TAKE 1 TABLET(20 MG) BY MOUTH DAILY, Disp: 90 tablet, Rfl: 0 .  PREMARIN vaginal cream, INSERT 4.25 APPLICATORFUL VAGINALLY THREE TIMES WEEKLY AS DIRECTED, Disp: 30 g, Rfl: 2 .  progesterone (PROMETRIUM) 200 MG capsule, TAKE 1 CAPSULE BY MOUTH EVERY DAY FOR 2 WEEKS EVERY THIRD MONTH: JANUARY, APRIL, JULY AND OCTOBER, Disp: 14 capsule, Rfl: 3 .  propranolol (INDERAL) 10 MG tablet, Take 1 tablet (10 mg total) by mouth 3 (three) times daily., Disp: 90 tablet, Rfl: 2 .  risperiDONE (RISPERDAL) 0.5 MG tablet, Take 0.5 mg at bedtime by mouth., Disp: , Rfl:  .  sertraline (ZOLOFT) 50 MG tablet, Take 50 mg daily by mouth., Disp: , Rfl:  .  sucralfate (CARAFATE) 1 g tablet, TAKE 1 TABLET BY MOUTH EVERY MORNING AND AT BEDTIME AS NEEDED FOR STOMACH BURNING.( MAY CRUSH 1 TABLET AND MIX IN APPLESAUCE), Disp: 180 tablet, Rfl: 0 .  SYMBICORT 80-4.5 MCG/ACT inhaler, INHALE 2 PUFFS INTO THE LUNGS TWICE DAILY, Disp: 10.2 g, Rfl: 5 .  tiZANidine (ZANAFLEX) 4 MG tablet, TAKE 1 TABLET BY MOUTH TWICE DAILY FOR BACK OR SPASM, Disp: 60 tablet, Rfl: 0 .  zonisamide (ZONEGRAN) 50 MG capsule, Take 150 mg by mouth daily., Disp: , Rfl: 2  Allergies Benadryl [diphenhydramine]; Latex; and Penicillins  Review of Systems Review of Systems - Oncology ROS as per HPI otherwise 12 point ROS is negative.   Physical Exam  Vitals Wt Readings from Last 3 Encounters:   06/17/17 144 lb 11.2 oz (65.6 kg)  05/26/17 148 lb (67.1 kg)  05/25/17 148 lb (67.1 kg)   Temp Readings from Last 3 Encounters:  06/17/17 97.9 F (36.6 C) (Oral)  05/26/17 (!) 97.5 F (36.4 C) (Oral)  03/20/17 (!) 97.5 F (36.4 C) (Oral)   BP Readings from Last 3 Encounters:  06/17/17 110/70  05/27/17 (!) 142/83  05/25/17 (!) 100/54   Pulse Readings from  Last 3 Encounters:  06/17/17 (!) 58  05/27/17 88  05/25/17 (!) 59   Constitutional: Well-developed, well-nourished, and in no distress.   HENT: Head: Normocephalic and atraumatic.  Mouth/Throat: No oropharyngeal exudate. Mucosa moist. Eyes: Pupils are equal, round, and reactive to light. Conjunctivae are normal. No scleral icterus.  Neck: Normal range of motion. Neck supple. No JVD present.  Cardiovascular: Normal rate, regular rhythm and normal heart sounds.  Exam reveals no gallop and no friction rub.   No murmur heard. Pulmonary/Chest: Effort normal and breath sounds normal. No respiratory distress. No wheezes.No rales.  Abdominal: Soft. Bowel sounds are normal. No distension. There is no tenderness. There is no guarding.  Musculoskeletal: No edema or tenderness.  Lymphadenopathy: No cervical, axillary or supraclavicular adenopathy.  Neurological: Alert and oriented to person, place, and time. No cranial nerve deficit.  Skin: Skin is warm and dry. No rash noted. No erythema. No pallor.  Psychiatric: Affect and judgment normal.   Labs No visits with results within 3 Day(s) from this visit.  Latest known visit with results is:  Appointment on 06/10/2017  Component Date Value Ref Range Status  . WBC 06/10/2017 9.8  4.0 - 10.5 K/uL Final  . RBC 06/10/2017 3.90  3.87 - 5.11 MIL/uL Final  . Hemoglobin 06/10/2017 11.7* 12.0 - 15.0 g/dL Final  . HCT 06/10/2017 36.2  36.0 - 46.0 % Final  . MCV 06/10/2017 92.8  78.0 - 100.0 fL Final  . MCH 06/10/2017 30.0  26.0 - 34.0 pg Final  . MCHC 06/10/2017 32.3  30.0 - 36.0 g/dL Final   . RDW 06/10/2017 13.7  11.5 - 15.5 % Final  . Platelets 06/10/2017 210  150 - 400 K/uL Final  . Neutrophils Relative % 06/10/2017 68  % Final  . Neutro Abs 06/10/2017 6.7  1.7 - 7.7 K/uL Final  . Lymphocytes Relative 06/10/2017 25  % Final  . Lymphs Abs 06/10/2017 2.4  0.7 - 4.0 K/uL Final  . Monocytes Relative 06/10/2017 5  % Final  . Monocytes Absolute 06/10/2017 0.5  0.1 - 1.0 K/uL Final  . Eosinophils Relative 06/10/2017 2  % Final  . Eosinophils Absolute 06/10/2017 0.2  0.0 - 0.7 K/uL Final  . Basophils Relative 06/10/2017 0  % Final  . Basophils Absolute 06/10/2017 0.0  0.0 - 0.1 K/uL Final   Performed at [facility], [address], [place], [place] [address]  . Sodium 06/10/2017 140  135 - 145 mmol/L Final  . Potassium 06/10/2017 4.6  3.5 - 5.1 mmol/L Final  . Chloride 06/10/2017 106  101 - 111 mmol/L Final  . CO2 06/10/2017 27  22 - 32 mmol/L Final  . Glucose, Bld 06/10/2017 80  65 - 99 mg/dL Final  . BUN 06/10/2017 22* 6 - 20 mg/dL Final  . Creatinine, Ser 06/10/2017 1.02* 0.44 - 1.00 mg/dL Final  . Calcium 06/10/2017 9.7  8.9 - 10.3 mg/dL Final  . Total Protein 06/10/2017 7.5  6.5 - 8.1 g/dL Final  . Albumin 06/10/2017 4.3  3.5 - 5.0 g/dL Final  . AST 06/10/2017 19  15 - 41 U/L Final  . ALT 06/10/2017 19  14 - 54 U/L Final  . Alkaline Phosphatase 06/10/2017 67  38 - 126 U/L Final  . Total Bilirubin 06/10/2017 0.5  0.3 - 1.2 mg/dL Final  . GFR calc non Af Amer 06/10/2017 >60  >60 mL/min Final  . GFR calc Af Amer 06/10/2017 >60  >60 mL/min Final   Comment: (NOTE)  The eGFR has been calculated using the CKD EPI equation. This calculation has not been validated in all clinical situations. eGFR's persistently <60 mL/min signify possible Chronic Kidney Disease.   Georgiann Hahn gap 06/10/2017 7  5 - 15 Final   Performed at [facility], [address] [place], [place] [address]  . Iron 06/10/2017 38  28 - 170 ug/dL Final  . TIBC 06/10/2017 277  250 - 450 ug/dL Final  .  Saturation Ratios 06/10/2017 14  10.4 - 31.8 % Final  . UIBC 06/10/2017 239  ug/dL Final   Performed at [facility], [place] [address] [place], [place] [address]  . Ferritin 06/10/2017 121  11 - 307 ng/mL Final   Performed at [facility], [place] [address] [place], [place] [address]     Pathology Orders Placed This Encounter  Procedures  . CT Chest Wo Contrast    Standing Status:   Future    Standing Expiration Date:   06/17/2018    Order Specific Question:   Is patient pregnant?    Answer:   No    Order Specific Question:   Preferred imaging location?    Answer:   [facility]    Order Specific Question:   Radiology Contrast Protocol - do NOT remove file path    Answer:   [email or web address]  . CBC with Differential/Platelet    Standing Status:   Future    Standing Expiration Date:   06/18/2019  . Comprehensive metabolic panel    Standing Status:   Future    Standing Expiration Date:   06/18/2019  . Lactate dehydrogenase    Standing Status:   Future    Standing Expiration Date:   06/18/2019  . Ferritin    Standing Status:   Future    Standing Expiration Date:   06/18/2019       Zoila Shutter MD

## 2017-06-21 DIAGNOSIS — R51 Headache: Secondary | ICD-10-CM | POA: Diagnosis not present

## 2017-06-21 DIAGNOSIS — M542 Cervicalgia: Secondary | ICD-10-CM | POA: Diagnosis not present

## 2017-06-21 DIAGNOSIS — G43719 Chronic migraine without aura, intractable, without status migrainosus: Secondary | ICD-10-CM | POA: Diagnosis not present

## 2017-06-21 DIAGNOSIS — M791 Myalgia, unspecified site: Secondary | ICD-10-CM | POA: Diagnosis not present

## 2017-06-22 DIAGNOSIS — H52223 Regular astigmatism, bilateral: Secondary | ICD-10-CM | POA: Diagnosis not present

## 2017-06-25 ENCOUNTER — Ambulatory Visit (HOSPITAL_COMMUNITY)
Admission: RE | Admit: 2017-06-25 | Discharge: 2017-06-25 | Disposition: A | Discharge status: Home / Self Care | Payer: Medicare HMO | Source: Ambulatory Visit | Attending: Internal Medicine | Admitting: Internal Medicine

## 2017-06-25 DIAGNOSIS — F17218 Nicotine dependence, cigarettes, with other nicotine-induced disorders: Secondary | ICD-10-CM | POA: Insufficient documentation

## 2017-06-25 DIAGNOSIS — J439 Emphysema, unspecified: Secondary | ICD-10-CM | POA: Diagnosis not present

## 2017-06-25 DIAGNOSIS — J438 Other emphysema: Secondary | ICD-10-CM | POA: Insufficient documentation

## 2017-06-25 DIAGNOSIS — D500 Iron deficiency anemia secondary to blood loss (chronic): Secondary | ICD-10-CM | POA: Insufficient documentation

## 2017-06-27 ENCOUNTER — Other Ambulatory Visit: Payer: Self-pay | Admitting: Family Medicine

## 2017-06-29 DIAGNOSIS — F333 Major depressive disorder, recurrent, severe with psychotic symptoms: Secondary | ICD-10-CM | POA: Diagnosis not present

## 2017-07-12 ENCOUNTER — Ambulatory Visit (INDEPENDENT_AMBULATORY_CARE_PROVIDER_SITE_OTHER): Payer: Medicare HMO | Admitting: Obstetrics & Gynecology

## 2017-07-12 ENCOUNTER — Other Ambulatory Visit: Payer: Self-pay

## 2017-07-12 ENCOUNTER — Encounter: Payer: Self-pay | Admitting: Obstetrics & Gynecology

## 2017-07-12 VITALS — BP 98/55 | HR 74 | Ht 66.0 in | Wt 150.0 lb

## 2017-07-12 DIAGNOSIS — Z3202 Encounter for pregnancy test, result negative: Secondary | ICD-10-CM | POA: Diagnosis not present

## 2017-07-12 DIAGNOSIS — R87810 Cervical high risk human papillomavirus (HPV) DNA test positive: Secondary | ICD-10-CM

## 2017-07-12 DIAGNOSIS — R87610 Atypical squamous cells of undetermined significance on cytologic smear of cervix (ASC-US): Secondary | ICD-10-CM | POA: Diagnosis not present

## 2017-07-12 LAB — POCT URINE PREGNANCY: PREG TEST UR: NEGATIVE

## 2017-07-12 NOTE — Progress Notes (Signed)
Colposcopy Procedure Note:  Colposcopy Procedure Note  Indications: Pap smear 1 months ago showed: ASCUS with POSITIVE high risk HPV. x2 nowThe prior pap showed ASCUS with POSITIVE high risk HPV.  Prior cervical/vaginal disease: . Prior cervical treatment: .  Smoker:  Yes.   New sexual partner:  No.    History of abnormal Pap: no  Procedure Details  The risks and benefits of the procedure and Written informed consent obtained.  Speculum placed in vagina and excellent visualization of cervix achieved, cervix swabbed x 3 with acetic acid solution.  Findings: Cervix: no visible lesions, no mosaicism, no punctation and no abnormal vasculature; SCJ visualized 360 degrees without lesions and no biopsies taken. Vaginal inspection: normal without visible lesions. Vulvar colposcopy: vulvar colposcopy not performed.  Specimens: none  Complications: none.  Plan: Repeat Pap 1 year

## 2017-07-16 ENCOUNTER — Other Ambulatory Visit: Payer: Self-pay | Admitting: Family Medicine

## 2017-07-16 DIAGNOSIS — J45909 Unspecified asthma, uncomplicated: Secondary | ICD-10-CM

## 2017-07-20 DIAGNOSIS — M542 Cervicalgia: Secondary | ICD-10-CM | POA: Diagnosis not present

## 2017-07-20 DIAGNOSIS — R51 Headache: Secondary | ICD-10-CM | POA: Diagnosis not present

## 2017-07-20 DIAGNOSIS — G43719 Chronic migraine without aura, intractable, without status migrainosus: Secondary | ICD-10-CM | POA: Diagnosis not present

## 2017-07-20 DIAGNOSIS — M791 Myalgia, unspecified site: Secondary | ICD-10-CM | POA: Diagnosis not present

## 2017-07-21 ENCOUNTER — Other Ambulatory Visit: Payer: Self-pay | Admitting: Family Medicine

## 2017-07-28 ENCOUNTER — Other Ambulatory Visit: Payer: Self-pay | Admitting: Obstetrics and Gynecology

## 2017-07-28 NOTE — Telephone Encounter (Signed)
refil prometrium to use q 3 mo, refil x 3

## 2017-07-30 DIAGNOSIS — E559 Vitamin D deficiency, unspecified: Secondary | ICD-10-CM | POA: Diagnosis not present

## 2017-07-30 DIAGNOSIS — E7849 Other hyperlipidemia: Secondary | ICD-10-CM | POA: Diagnosis not present

## 2017-07-30 DIAGNOSIS — D500 Iron deficiency anemia secondary to blood loss (chronic): Secondary | ICD-10-CM | POA: Diagnosis not present

## 2017-07-31 LAB — COMPLETE METABOLIC PANEL WITH GFR
AG Ratio: 1.8 (calc) (ref 1.0–2.5)
ALT: 9 U/L (ref 6–29)
AST: 13 U/L (ref 10–30)
Albumin: 4.4 g/dL (ref 3.6–5.1)
Alkaline phosphatase (APISO): 81 U/L (ref 33–115)
BUN: 19 mg/dL (ref 7–25)
CO2: 27 mmol/L (ref 20–32)
Calcium: 9.3 mg/dL (ref 8.6–10.2)
Chloride: 105 mmol/L (ref 98–110)
Creat: 0.82 mg/dL (ref 0.50–1.10)
GFR, Est African American: 104 mL/min/{1.73_m2} (ref >=60)
GFR, Est Non African American: 90 mL/min/{1.73_m2} (ref >=60)
GLUCOSE: 91 mg/dL (ref 65–99)
Globulin: 2.4 g/dL (calc) (ref 1.9–3.7)
POTASSIUM: 4 mmol/L (ref 3.5–5.3)
Sodium: 139 mmol/L (ref 135–146)
TOTAL PROTEIN: 6.8 g/dL (ref 6.1–8.1)
Total Bilirubin: 0.3 mg/dL (ref 0.2–1.2)

## 2017-07-31 LAB — LIPID PANEL
CHOL/HDL RATIO: 4 (calc) (ref <5.0)
Cholesterol: 166 mg/dL (ref <200)
HDL: 41 mg/dL — ABNORMAL LOW (ref >50)
LDL Cholesterol (Calc): 105 mg/dL (calc) — ABNORMAL HIGH
NON-HDL CHOLESTEROL (CALC): 125 mg/dL (ref <130)
TRIGLYCERIDES: 106 mg/dL (ref <150)

## 2017-07-31 LAB — VITAMIN D 25 HYDROXY (VIT D DEFICIENCY, FRACTURES): VIT D 25 HYDROXY: 38 ng/mL (ref 30–100)

## 2017-07-31 LAB — TSH: TSH: 0.54 m[IU]/L

## 2017-08-02 ENCOUNTER — Other Ambulatory Visit: Payer: Self-pay

## 2017-08-02 ENCOUNTER — Encounter: Payer: Self-pay | Admitting: Family Medicine

## 2017-08-02 ENCOUNTER — Ambulatory Visit (INDEPENDENT_AMBULATORY_CARE_PROVIDER_SITE_OTHER): Payer: Medicare HMO | Admitting: Family Medicine

## 2017-08-02 VITALS — BP 110/60 | HR 60 | Resp 12 | Ht 66.0 in | Wt 150.0 lb

## 2017-08-02 DIAGNOSIS — E559 Vitamin D deficiency, unspecified: Secondary | ICD-10-CM | POA: Diagnosis not present

## 2017-08-02 DIAGNOSIS — F17218 Nicotine dependence, cigarettes, with other nicotine-induced disorders: Secondary | ICD-10-CM | POA: Diagnosis not present

## 2017-08-02 DIAGNOSIS — E7849 Other hyperlipidemia: Secondary | ICD-10-CM | POA: Diagnosis not present

## 2017-08-02 DIAGNOSIS — K612 Anorectal abscess: Secondary | ICD-10-CM

## 2017-08-02 DIAGNOSIS — F329 Major depressive disorder, single episode, unspecified: Secondary | ICD-10-CM

## 2017-08-02 DIAGNOSIS — F32A Depression, unspecified: Secondary | ICD-10-CM

## 2017-08-02 MED ORDER — PRAVASTATIN SODIUM 20 MG PO TABS: 20.0000 mg | ORAL_TABLET | Freq: Every day | ORAL | 3 refills | Status: DC

## 2017-08-02 MED ORDER — SULFAMETHOXAZOLE-TRIMETHOPRIM 800-160 MG PO TABS: 1.0000 | ORAL_TABLET | Freq: Two times a day (BID) | ORAL | 0 refills | Status: DC

## 2017-08-02 MED ORDER — POTASSIUM CHLORIDE CRYS ER 20 MEQ PO TBCR: EXTENDED_RELEASE_TABLET | ORAL | 3 refills | Status: DC

## 2017-08-02 NOTE — Patient Instructions (Addendum)
Wellness with nurse in 3 to 4 months, flu vaccine at that visit  MD follow up in January, call if you need me before  Now smoking 2 cigarettes QUIT date  Is August 1  Increase walking to 45 minutes daily  Please cut back on fried and fatty foods  Continue meds you are taking and continue to take care of yourself  10 day course of Septra is prescribed for the infection on your buttock , take entire course, do NOT scratch the area

## 2017-08-02 NOTE — Progress Notes (Signed)
   Celicia Minahan Circle     MRN: [id number]      DOB: [birth year]   HPI Ms. Filip is here for follow up and re-evaluation of chronic medical conditions, medication management and review of any available recent lab and radiology data.  Preventive health is updated, specifically  Cancer screening and Immunization.   Questions or concerns regarding consultations or procedures which the PT has had in the interim are  addressed. The PT denies any adverse reactions to current medications since the last visit.  5 day h/o painful lesion on buttock states she has pus  Draining from the lesion, still rates the pain at a 10 no fever or chills   ROS Denies recent fever or chills. Denies sinus pressure, nasal congestion, ear pain or sore throat. Denies chest congestion, productive cough or wheezing. Denies chest pains, palpitations and leg swelling Denies abdominal pain, nausea, vomiting,diarrhea or constipation.   Denies dysuria, frequency, hesitancy or incontinence. Denies joint pain, swelling and limitation in mobility. Denies headaches, seizures, numbness, or tingling. Denies uncontrolled  depression, anxiety or insomnia.   PE  BP 110/60 (BP Location: Left Arm, Patient Position: Sitting, Cuff Size: Normal)   Pulse 60   Resp 12   Ht 5\' 6"  (1.676 m)   Wt 150 lb (68 kg)   SpO2 100%   BMI 24.21 kg/m   Patient alert and oriented and in no cardiopulmonary distress. Patient in pain  HEENT: No facial asymmetry, EOMI,   oropharynx pink and moist.  Neck supple no JVD, no mass.  Chest: Clear to auscultation bilaterally.Decreased air entry CVS: S1, S2 no murmurs, no S3.Regular rate.  ABD: Soft non tender.   Ext: No edema  MS: Adequate ROM spine, shoulders, hips and knees.  Skin: abscess on left buttock, diameter approximately 2.5 inches  Psych: Good eye contact, normal affect. Memory intact not anxious or depressed appearing.  CNS: CN 2-12 intact, power,  normal throughout.no focal  deficits noted.   Assessment & Plan  Abscess of anal or rectal region 10 day course of septra prescribed and pt advised to keep area clean, to desist from traumatizing the skin and to take the entire antibiotic course  Nicotine dependence, cigarettes, with other nicotine-induced disorders Ask: confirms currently smokes 2 to 3 cigarettes Assess: ready to quit , date set for 08/19/2017, applauded on her decision Assist: counsel;ed for 5 mins and literature provided as well as reminded of [address] [place] number to call Advise: ned to  Quit to reduce risk of cancer, heart disease, stroke and lung disease Arrange : f/u in 3 month   Hyperlipemia Hyperlipidemia:Low fat diet discussed and encouraged.   Lipid Panel  Lab Results  Component Value Date   CHOL 166 07/30/2017   HDL 41 (L) 07/30/2017   LDLCALC 105 (H) 07/30/2017   TRIG 106 07/30/2017   CHOLHDL 4.0 07/30/2017   Needs to increase regular exercise and reduce fat intake , not at goal    Depression Well treated on current regime by psychiatry, PHQ 9 score of 2 she is to continue current mental health treatment  Vitamin D deficiency Continue replacement  has premature ovarian failure Updated lab needed at/ before next visit.

## 2017-08-03 DIAGNOSIS — F333 Major depressive disorder, recurrent, severe with psychotic symptoms: Secondary | ICD-10-CM | POA: Diagnosis not present

## 2017-08-05 ENCOUNTER — Other Ambulatory Visit: Payer: Self-pay | Admitting: Gastroenterology

## 2017-08-05 ENCOUNTER — Other Ambulatory Visit: Payer: Self-pay | Admitting: Family Medicine

## 2017-08-05 DIAGNOSIS — K219 Gastro-esophageal reflux disease without esophagitis: Secondary | ICD-10-CM

## 2017-08-05 DIAGNOSIS — K3184 Gastroparesis: Secondary | ICD-10-CM

## 2017-08-05 DIAGNOSIS — K313 Pylorospasm, not elsewhere classified: Secondary | ICD-10-CM

## 2017-08-05 DIAGNOSIS — K5909 Other constipation: Secondary | ICD-10-CM

## 2017-08-08 ENCOUNTER — Encounter: Payer: Self-pay | Admitting: Family Medicine

## 2017-08-08 DIAGNOSIS — K612 Anorectal abscess: Secondary | ICD-10-CM | POA: Insufficient documentation

## 2017-08-08 NOTE — Assessment & Plan Note (Signed)
10 day course of septra prescribed and pt advised to keep area clean, to desist from traumatizing the skin and to take the entire antibiotic course

## 2017-08-08 NOTE — Assessment & Plan Note (Signed)
Ask: confirms currently smokes 2 to 3 cigarettes Assess: ready to quit , date set for 08/19/2017, applauded on her decision Assist: counsel;ed for 5 mins and literature provided as well as reminded of [address] [place] number to call Advise: ned to  Quit to reduce risk of cancer, heart disease, stroke and lung disease Arrange : f/u in 3 month

## 2017-08-08 NOTE — Assessment & Plan Note (Signed)
Continue replacement  has premature ovarian failure Updated lab needed at/ before next visit.

## 2017-08-08 NOTE — Assessment & Plan Note (Signed)
Well treated on current regime by psychiatry, PHQ 9 score of 2 she is to continue current mental health treatment

## 2017-08-08 NOTE — Assessment & Plan Note (Signed)
Hyperlipidemia:Low fat diet discussed and encouraged.   Lipid Panel  Lab Results  Component Value Date   CHOL 166 07/30/2017   HDL 41 (L) 07/30/2017   LDLCALC 105 (H) 07/30/2017   TRIG 106 07/30/2017   CHOLHDL 4.0 07/30/2017   Needs to increase regular exercise and reduce fat intake , not at goal

## 2017-08-13 ENCOUNTER — Other Ambulatory Visit: Payer: Self-pay | Admitting: Gastroenterology

## 2017-08-13 ENCOUNTER — Other Ambulatory Visit: Payer: Self-pay | Admitting: Family Medicine

## 2017-08-13 DIAGNOSIS — J45909 Unspecified asthma, uncomplicated: Secondary | ICD-10-CM

## 2017-08-16 ENCOUNTER — Other Ambulatory Visit: Payer: Self-pay | Admitting: Family Medicine

## 2017-08-17 ENCOUNTER — Other Ambulatory Visit: Payer: Self-pay | Admitting: Family Medicine

## 2017-08-17 ENCOUNTER — Telehealth: Payer: Self-pay | Admitting: Family Medicine

## 2017-08-17 DIAGNOSIS — E2839 Other primary ovarian failure: Secondary | ICD-10-CM

## 2017-08-17 DIAGNOSIS — E288 Other ovarian dysfunction: Principal | ICD-10-CM

## 2017-08-17 NOTE — Telephone Encounter (Signed)
Tracey Daniel is calling from [phone] fax --ph [phone]. Family Medical equipment-- knee\back brace .Marland KitchenMarland Kitchen

## 2017-08-17 NOTE — Telephone Encounter (Signed)
We do not prescribe braces 

## 2017-08-18 ENCOUNTER — Other Ambulatory Visit: Payer: Self-pay | Admitting: Family Medicine

## 2017-09-09 DIAGNOSIS — G43719 Chronic migraine without aura, intractable, without status migrainosus: Secondary | ICD-10-CM | POA: Diagnosis not present

## 2017-09-09 DIAGNOSIS — M542 Cervicalgia: Secondary | ICD-10-CM | POA: Diagnosis not present

## 2017-09-09 DIAGNOSIS — R51 Headache: Secondary | ICD-10-CM | POA: Diagnosis not present

## 2017-09-09 DIAGNOSIS — M791 Myalgia, unspecified site: Secondary | ICD-10-CM | POA: Diagnosis not present

## 2017-09-09 DIAGNOSIS — G518 Other disorders of facial nerve: Secondary | ICD-10-CM | POA: Diagnosis not present

## 2017-09-11 ENCOUNTER — Other Ambulatory Visit: Payer: Self-pay | Admitting: Family Medicine

## 2017-09-16 ENCOUNTER — Other Ambulatory Visit: Payer: Self-pay | Admitting: Family Medicine

## 2017-09-16 DIAGNOSIS — J45909 Unspecified asthma, uncomplicated: Secondary | ICD-10-CM

## 2017-09-28 ENCOUNTER — Other Ambulatory Visit: Payer: Self-pay | Admitting: Obstetrics and Gynecology

## 2017-10-03 ENCOUNTER — Other Ambulatory Visit: Payer: Self-pay | Admitting: Obstetrics and Gynecology

## 2017-10-03 NOTE — Telephone Encounter (Signed)
refil prometrium for q 3 months , refil x 2

## 2017-10-06 ENCOUNTER — Telehealth: Payer: Self-pay | Admitting: Obstetrics and Gynecology

## 2017-10-06 NOTE — Telephone Encounter (Signed)
Patient informed Estadiol was sent on 9/11. Patient states pharmacy does not have prescription.   Pharmacy states they do have the prescription however, the patient got a 90 day supply filled on July 18 and is not due again until 10/18.    Patient notified and stated she was out of her medication.  Advised to call her pharmacy to discuss what she needs to do.  Verbalized understanding.

## 2017-10-06 NOTE — Telephone Encounter (Signed)
Patient called stating that she has called her pharmacy and [employer] to get a refill of her medication that she has been out of for 7 days. Pt states that she needs that refill as soon as possible. Please contact pt

## 2017-10-16 ENCOUNTER — Other Ambulatory Visit: Payer: Self-pay | Admitting: Family Medicine

## 2017-10-16 DIAGNOSIS — J45909 Unspecified asthma, uncomplicated: Secondary | ICD-10-CM

## 2017-10-27 ENCOUNTER — Ambulatory Visit (INDEPENDENT_AMBULATORY_CARE_PROVIDER_SITE_OTHER): Payer: Medicare HMO | Admitting: Advanced Practice Midwife

## 2017-10-27 ENCOUNTER — Encounter: Payer: Self-pay | Admitting: Advanced Practice Midwife

## 2017-10-27 VITALS — BP 99/65 | HR 60 | Ht 66.0 in | Wt 149.0 lb

## 2017-10-27 DIAGNOSIS — F526 Dyspareunia not due to a substance or known physiological condition: Secondary | ICD-10-CM | POA: Diagnosis not present

## 2017-10-27 NOTE — Progress Notes (Signed)
[facility] Visit  Patient name: Tracey Daniel MRN [id number]  Date of birth: Mar 02, 1980  CC & HPI:  Tracey Daniel is a 37 y.o. African American female presenting today for painful intercourse about 2 weeks ago. She has had sex once since then and it was better, but still a little sore.  Says introitus looked swollen and had "a sore".  Not swollen now and no visible sore. Had run out of estrogen for over a week  Pertinent History Reviewed:  Medical & Surgical Hx:   Past Medical History:  Diagnosis Date  . Anemia of other chronic disease 11/29/2012  . Asthma   . Back pain, chronic   . Chronic abdominal pain   . Constipation   . COPD (chronic obstructive pulmonary disease) ([place])   . Cyst of skin    mid spine area  . Depression 2000   h/o suicidal ideation  . Gastric outlet obstruction   . Gastritis   . Gastroparesis   . Migraine   . Migraines   . Nausea and vomiting    recurrent  . Nicotine addiction   . Osteoporosis   . Peptic ulcer disease   . Pyloric spasm 03/30/2011  . Seasonal allergies   . Sinusitis   . Substance abuse ([place]) 2008   marijuana  . Vitamin D deficiency    Past Surgical History:  Procedure Laterality Date  . BALLOON DILATION N/A 02/09/2013   Procedure: BALLOON DILATION;  Surgeon: Missy Sabins, MD;  [facility];  Service: Endoscopy;  Laterality: N/A;  . BOTOX INJECTION N/A 02/09/2013   Procedure: BOTOX INJECTION;  Surgeon: Missy Sabins, MD;  [facility];  Service: Endoscopy;  Laterality: N/A;  possible balloon  . BOTOX INJECTION N/A 10/24/2015   Procedure: BOTOX INJECTION;  Surgeon: Daneil Dolin, MD;  [facility];  Service: Endoscopy;  Laterality: N/A;  . CARPAL TUNNEL RELEASE     left hand  . CHOLECYSTECTOMY     ?2002  . COLONOSCOPY WITH PROPOFOL N/A 09/20/2014   RMR: Normal ileo-colonoscopy  . ESOPHAGEAL DILATION  12/25/2010   Procedure: ESOPHAGEAL DILATION;  Surgeon: Dorothyann Peng, MD;  [facility];  Service: Endoscopy;;  . ESOPHAGOGASTRODUODENOSCOPY  12/25/2010   Dorothyann Peng, MD;  moderate gastritis, ?goo secondary to pylorspasm. BX showed reactive gstropathy no h.pyori, SB mucosa with intramucosal lymphocytosis and partial villous blunting (TTG 4.0 normal)  . ESOPHAGOGASTRODUODENOSCOPY N/A 11/14/2012   XNA:TFTDD hiatal hernia. Abnormal gastric mucosa of  uncertain significance-status post biopsy. Subjectively, patient may have recurrent symptomatic, pylorospasm.  . ESOPHAGOGASTRODUODENOSCOPY (EGD) WITH PROPOFOL N/A 02/09/2013   elongated stomach, partial lower esophageal ring widely patent. No obvious pyloric stenosis s/p Botox  . ESOPHAGOGASTRODUODENOSCOPY (EGD) WITH PROPOFOL N/A 10/24/2015   Procedure: ESOPHAGOGASTRODUODENOSCOPY (EGD) WITH PROPOFOL;  Surgeon: Daneil Dolin, MD;  [facility];  Service: Endoscopy;  Laterality: N/A;  830   . laser surgery on cervix    . NASAL SEPTUM SURGERY  01/29/2017  . TOE SURGERY     Family History  Problem Relation Age of Onset  . Diabetes Father   . Liver disease Father        liver transplant at [facility], age 59  . Lung cancer Mother 77  . Heart disease Maternal Grandmother   . Parkinson's disease Maternal Grandfather   . Multiple sclerosis Sister 35  . Depression Sister 2  . Alcohol abuse Sister   . Bipolar disorder  Sister   . Schizophrenia Sister   . Colon cancer Neg Hx     Current Outpatient Medications:  .  azelastine (ASTELIN) 0.1 % nasal spray, USE 2 SPRAYS IN EACH NOSTRIL TWICE DAILY AS DIRECTED, Disp: 30 mL, Rfl: 5 .  BIOTIN PO, Take 1 tablet by mouth daily., Disp: , Rfl:  .  calcium-vitamin D (OSCAL WITH D) 500-200 MG-UNIT per tablet, Take 1 tablet by mouth 2 (two) times daily., Disp: , Rfl:  .  chlorhexidine (PERIDEX) 0.12 % solution, 5 mLs by Mouth Rinse route daily., Disp: , Rfl: 2 .  clobetasol cream (TEMOVATE) 0.86 %, Apply 1 application topically 2 (two) times daily as needed (rash). Apply to  rash, Disp: , Rfl:  .  estradiol (ESTRACE) 1 MG tablet, TAKE 1 TABLET(1 MG) BY MOUTH DAILY, Disp: 90 tablet, Rfl: 3 .  EVENING PRIMROSE OIL PO, Take by mouth 2 (two) times daily., Disp: , Rfl:  .  feeding supplement (ENSURE CLINICAL STRENGTH) LIQD, Take 237 mLs by mouth 3 (three) times daily with meals., Disp: 10 Bottle, Rfl: 3 .  fluticasone (FLONASE) 50 MCG/ACT nasal spray, SHAKE LIQUID AND USE 2 SPRAYS IN EACH NOSTRIL DAILY, Disp: 48 g, Rfl: 0 .  gabapentin (NEURONTIN) 100 MG capsule, Take 100 mg by mouth 3 (three) times daily., Disp: , Rfl:  .  LINZESS 290 MCG CAPS capsule, TAKE 1 CAPSULE(290 MCG) BY MOUTH DAILY, Disp: 90 capsule, Rfl: 3 .  loratadine (CLARITIN) 10 MG tablet, Take 1 tablet (10 mg total) by mouth daily., Disp: 30 tablet, Rfl: 5 .  ondansetron (ZOFRAN ODT) 4 MG disintegrating tablet, Take 1 tablet (4 mg total) by mouth every 8 (eight) hours as needed for nausea., Disp: 10 tablet, Rfl: 0 .  pantoprazole (PROTONIX) 40 MG tablet, TAKE 1 TABLET BY MOUTH TWICE DAILY BEFORE A MEAL, Disp: 180 tablet, Rfl: 2 .  Polyethyl Glycol-Propyl Glycol (SYSTANE OP), Place 2 drops into the left eye 3 (three) times daily., Disp: , Rfl:  .  potassium chloride SA (K-DUR,KLOR-CON) 20 MEQ tablet, One tablet once daily by mouth, Disp: 90 tablet, Rfl: 3 .  potassium chloride SA (K-DUR,KLOR-CON) 20 MEQ tablet, TAKE 1 TABLET(20 MEQ) BY MOUTH DAILY, Disp: 30 tablet, Rfl: 0 .  pravastatin (PRAVACHOL) 20 MG tablet, Take 1 tablet (20 mg total) by mouth daily., Disp: 90 tablet, Rfl: 3 .  PREMARIN vaginal cream, INSERT 7.61 APPLICATORFUL VAGINALLY THREE TIMES WEEKLY AS DIRECTED, Disp: 30 g, Rfl: 2 .  progesterone (PROMETRIUM) 200 MG capsule, TAKE 1 CAPSULE BY MOUTH EVERY DAY FOR 2 WEEKS EVERY THIRD MONTH: JANUARY, APRIL, JULY AND OCTOBER, Disp: 14 capsule, Rfl: 2 .  propranolol (INDERAL) 10 MG tablet, Take 1 tablet (10 mg total) by mouth 3 (three) times daily., Disp: 90 tablet, Rfl: 2 .  risperiDONE (RISPERDAL) 0.5  MG tablet, Take 0.5 mg at bedtime by mouth., Disp: , Rfl:  .  sertraline (ZOLOFT) 50 MG tablet, Take 50 mg daily by mouth., Disp: , Rfl:  .  sucralfate (CARAFATE) 1 g tablet, TAKE 1 TABLET BY MOUTH EVERY MORNING AND AT BEDTIME AS NEEDED FOR STOMACH BURNING.( MAY CRUSH 1 TABLET AND MIX IN APPLESAUCE), Disp: 180 tablet, Rfl: 0 .  SYMBICORT 80-4.5 MCG/ACT inhaler, INHALE 2 PUFFS INTO THE LUNGS TWICE DAILY, Disp: 10.2 g, Rfl: 0 .  tiZANidine (ZANAFLEX) 4 MG tablet, TAKE 1 TABLET BY MOUTH TWICE DAILY FOR BACK OR SPASM, Disp: 180 tablet, Rfl: 0 .  VENTOLIN HFA 108 (90 Base) MCG/ACT inhaler,  INHALE 2 PUFFS INTO THE LUNGS EVERY 6 HOURS AS NEEDED FOR SHORTNESS OF BREATH, Disp: 18 g, Rfl: 5 .  zonisamide (ZONEGRAN) 50 MG capsule, Take 150 mg by mouth daily., Disp: , Rfl: 2 .  pantoprazole (PROTONIX) 40 MG tablet, TAKE 1 TABLET(40 MG) BY MOUTH TWICE DAILY BEFORE A MEAL (Patient not taking: Reported on 10/27/2017), Disp: 180 tablet, Rfl: 1 .  pravastatin (PRAVACHOL) 20 MG tablet, TAKE 1 TABLET(20 MG) BY MOUTH DAILY (Patient not taking: Reported on 10/27/2017), Disp: 90 tablet, Rfl: 0 .  sulfamethoxazole-trimethoprim (BACTRIM DS,SEPTRA DS) 800-160 MG tablet, Take 1 tablet by mouth 2 (two) times daily. (Patient not taking: Reported on 10/27/2017), Disp: 20 tablet, Rfl: 0 Social History: Reviewed -  reports that she has been smoking cigarettes. She has a 1.50 pack-year smoking history. She has never used smokeless tobacco.  Review of Systems:   Constitutional: Negative for fever and chills Eyes: Negative for visual disturbances Respiratory: Negative for shortness of breath, dyspnea Cardiovascular: Negative for chest pain or palpitations  Gastrointestinal: Negative for vomiting, diarrhea and constipation; no abdominal pain Genitourinary: Negative for dysuria and urgency, vaginal irritation or itching Musculoskeletal: Negative for back pain, joint pain, myalgias  Neurological: Negative for dizziness and  headaches    Objective Findings:    Physical Examination: Vitals:   10/27/17 1040  BP: 99/65  Pulse: 60   General appearance - well appearing, and in no distress Mental status - alert, oriented to person, place, and time Chest:  Normal respiratory effort Heart - normal rate and regular rhythm Abdomen:  Soft, nontender Pelvic: vagina appears normal. No lesions, skin pale pink, normal appearing dishcarge.  Wet prep neg Musculoskeletal:  Normal range of motion without pain Extremities:  No edema    No results found for this or any previous visit (from the past 24 hour(s)).    Assessment & Plan:  A:   Dyspareunia (at vaginal entrance) 2 weeks ago, improved P:  ? HSV versus abrasion/est depletion   No follow-ups on file.  Joaquim Lai Cresenzo-Dishmon CNM 10/27/2017 10:52 AM

## 2017-10-28 DIAGNOSIS — G518 Other disorders of facial nerve: Secondary | ICD-10-CM | POA: Diagnosis not present

## 2017-10-28 DIAGNOSIS — M542 Cervicalgia: Secondary | ICD-10-CM | POA: Diagnosis not present

## 2017-10-28 DIAGNOSIS — M791 Myalgia, unspecified site: Secondary | ICD-10-CM | POA: Diagnosis not present

## 2017-10-28 DIAGNOSIS — R51 Headache: Secondary | ICD-10-CM | POA: Diagnosis not present

## 2017-10-28 DIAGNOSIS — G43719 Chronic migraine without aura, intractable, without status migrainosus: Secondary | ICD-10-CM | POA: Diagnosis not present

## 2017-10-29 ENCOUNTER — Other Ambulatory Visit: Payer: Self-pay | Admitting: Gastroenterology

## 2017-10-29 DIAGNOSIS — K3184 Gastroparesis: Secondary | ICD-10-CM

## 2017-10-29 DIAGNOSIS — K313 Pylorospasm, not elsewhere classified: Secondary | ICD-10-CM

## 2017-10-29 DIAGNOSIS — K5909 Other constipation: Secondary | ICD-10-CM

## 2017-10-29 DIAGNOSIS — K219 Gastro-esophageal reflux disease without esophagitis: Secondary | ICD-10-CM

## 2017-11-02 ENCOUNTER — Telehealth: Payer: Self-pay | Admitting: Internal Medicine

## 2017-11-02 NOTE — Telephone Encounter (Signed)
[phone]  PATIENT NEEDS GABAPENTIN 100 SENT TO HER PHARMACY, [facility] [address].

## 2017-11-03 ENCOUNTER — Telehealth: Payer: Self-pay | Admitting: Family Medicine

## 2017-11-03 NOTE — Telephone Encounter (Signed)
Please call in Gabapentin 100mg  to [facility] on [address] in [place]

## 2017-11-03 NOTE — Telephone Encounter (Signed)
Spoke with pt. Records don't indicate that our doctors have prescribed this medication. Pt is going to call her pcp.

## 2017-11-05 ENCOUNTER — Other Ambulatory Visit: Payer: Self-pay

## 2017-11-05 MED ORDER — GABAPENTIN 100 MG PO CAPS: 100.0000 mg | ORAL_CAPSULE | Freq: Three times a day (TID) | ORAL | 3 refills | Status: DC

## 2017-11-05 NOTE — Telephone Encounter (Signed)
Refill sent in

## 2017-11-07 ENCOUNTER — Other Ambulatory Visit: Payer: Self-pay | Admitting: Obstetrics and Gynecology

## 2017-11-08 ENCOUNTER — Ambulatory Visit (INDEPENDENT_AMBULATORY_CARE_PROVIDER_SITE_OTHER): Payer: Medicare HMO

## 2017-11-08 VITALS — BP 97/56 | HR 61 | Resp 12 | Ht 66.0 in | Wt 155.0 lb

## 2017-11-08 DIAGNOSIS — Z0000 Encounter for general adult medical examination without abnormal findings: Secondary | ICD-10-CM

## 2017-11-08 DIAGNOSIS — Z23 Encounter for immunization: Secondary | ICD-10-CM | POA: Diagnosis not present

## 2017-11-08 NOTE — Progress Notes (Signed)
Subjective:   Tracey Daniel is a 37 y.o. female who presents for Medicare Annual (Subsequent) preventive examination.  Review of Systems:  Cardiac Risk Factors include: smoking/ tobacco exposure;dyslipidemia     Objective:     Vitals: BP (!) 97/56    Pulse 61    Resp 12    Ht 5\' 6"  (1.676 m)    Wt 155 lb (70.3 kg)    SpO2 99%    BMI 25.02 kg/m   Body mass index is 25.02 kg/m.  Advanced Directives 11/08/2017 06/17/2017 06/09/2016 06/04/2016 06/04/2016 04/22/2016 12/05/2015  Does Patient Have a Medical Advance Directive? No No No No No No No  Would patient like information on creating a medical advance directive? Yes (ED - Information included in AVS) No - Patient declined - Yes (MAU/Ambulatory/Procedural Areas - Information given) Yes (MAU/Ambulatory/Procedural Areas - Information given) - No - patient declined information  Pre-existing out of facility DNR order (yellow form or pink MOST form) - - - - - - -    Tobacco Social History   Tobacco Use  Smoking Status Current Every Day Smoker   Packs/day: 0.10   Years: 15.00   Pack years: 1.50   Types: Cigarettes  Smokeless Tobacco Never Used  Tobacco Comment   2 per day     Ready to quit: No Counseling given: Yes Comment: 2 per day   Clinical Intake:  Pre-visit preparation completed: Yes  Pain : No/denies pain Pain Score: 0-No pain     BMI - recorded: 25 Nutritional Status: BMI 25 -29 Overweight Nutritional Risks: None Diabetes: No  How often do you need to have someone help you when you read instructions, pamphlets, or other written materials from your doctor or pharmacy?: 3 - Sometimes What is the last grade level you completed in school?: 9th grade  Interpreter Needed?: No  Information entered by :: Francena Hanly LPN  Past Medical History:  Diagnosis Date   Anemia of other chronic disease 11/29/2012   Asthma    Back pain, chronic    Chronic abdominal pain    Constipation    COPD (chronic  obstructive pulmonary disease) ([place])    Cyst of skin    mid spine area   Depression 2000   h/o suicidal ideation   Gastric outlet obstruction    Gastritis    Gastroparesis    Migraine    Migraines    Nausea and vomiting    recurrent   Nicotine addiction    Osteoporosis    Peptic ulcer disease    Pyloric spasm 03/30/2011   Seasonal allergies    Sinusitis    Substance abuse ([place]) 2008   marijuana   Vitamin D deficiency    Past Surgical History:  Procedure Laterality Date   BALLOON DILATION N/A 02/09/2013   Procedure: BALLOON DILATION;  Surgeon: Missy Sabins, MD;  [facility];  Service: Endoscopy;  Laterality: N/A;   BOTOX INJECTION N/A 02/09/2013   Procedure: BOTOX INJECTION;  Surgeon: Missy Sabins, MD;  [facility];  Service: Endoscopy;  Laterality: N/A;  possible balloon   BOTOX INJECTION N/A 10/24/2015   Procedure: BOTOX INJECTION;  Surgeon: Daneil Dolin, MD;  [facility];  Service: Endoscopy;  Laterality: N/A;   CARPAL TUNNEL RELEASE     left hand   CHOLECYSTECTOMY     ?2002   COLONOSCOPY WITH PROPOFOL N/A 09/20/2014   RMR: Normal ileo-colonoscopy   ESOPHAGEAL DILATION  12/25/2010  Procedure: ESOPHAGEAL DILATION;  Surgeon: Dorothyann Peng, MD;  [facility];  Service: Endoscopy;;   ESOPHAGOGASTRODUODENOSCOPY  12/25/2010   Dorothyann Peng, MD;  moderate gastritis, ?goo secondary to pylorspasm. BX showed reactive gstropathy no h.pyori, SB mucosa with intramucosal lymphocytosis and partial villous blunting (TTG 4.0 normal)   ESOPHAGOGASTRODUODENOSCOPY N/A 11/14/2012   JYN:WGNFA hiatal hernia. Abnormal gastric mucosa of  uncertain significance-status post biopsy. Subjectively, patient may have recurrent symptomatic, pylorospasm.   ESOPHAGOGASTRODUODENOSCOPY (EGD) WITH PROPOFOL N/A 02/09/2013   elongated stomach, partial lower esophageal ring widely patent. No obvious pyloric stenosis s/p Botox    ESOPHAGOGASTRODUODENOSCOPY (EGD) WITH PROPOFOL N/A 10/24/2015   Procedure: ESOPHAGOGASTRODUODENOSCOPY (EGD) WITH PROPOFOL;  Surgeon: Daneil Dolin, MD;  [facility];  Service: Endoscopy;  Laterality: N/A;  830    laser surgery on cervix     NASAL SEPTUM SURGERY  01/29/2017   TOE SURGERY     Family History  Problem Relation Age of Onset   Diabetes Father    Liver disease Father        liver transplant at [facility], age 53   Lung cancer Mother 88   Heart disease Maternal Grandmother    Parkinson's disease Maternal Grandfather    Multiple sclerosis Sister 56   Depression Sister 21   Alcohol abuse Sister    Bipolar disorder Sister    Schizophrenia Sister    Colon cancer Neg Hx    Social History   Socioeconomic History   Marital status: Significant Other    Spouse name: Not on file   Number of children: 0   Years of education: 9   Highest education level: 9th grade  Occupational History   Occupation: disability    Fish farm manager: UNEMPLOYED  Social Designer, fashion/clothing strain: Not very hard   Food insecurity:    Worry: Sometimes true    Inability: Sometimes true   Transportation needs:    Medical: No    Non-medical: No  Tobacco Use   Smoking status: Current Every Day Smoker    Packs/day: 0.10    Years: 15.00    Pack years: 1.50    Types: Cigarettes   Smokeless tobacco: Never Used   Tobacco comment: 2 per day  Substance and Sexual Activity   Alcohol use: No    Alcohol/week: 0.0 standard drinks   Drug use: No   Sexual activity: Yes    Birth control/protection: None  Lifestyle   Physical activity:    Days per week: 3 days    Minutes per session: 30 min   Stress: Not at all  Relationships   Social connections:    Talks on phone: More than three times a week    Gets together: Twice a week    Attends religious service: Never    Active member of club or organization: No    Attends meetings of clubs or organizations: Never     Relationship status: Living with partner  Other Topics Concern   Not on file  Social History Narrative   Not on file    Outpatient Encounter Medications as of 11/08/2017  Medication Sig   azelastine (ASTELIN) 0.1 % nasal spray USE 2 SPRAYS IN EACH NOSTRIL TWICE DAILY AS DIRECTED   BIOTIN PO Take 1 tablet by mouth daily.   calcium-vitamin D (OSCAL WITH D) 500-200 MG-UNIT per tablet Take 1 tablet by mouth 2 (two) times daily.   chlorhexidine (PERIDEX) 0.12 % solution 5 mLs by Mouth Rinse  route daily.   clobetasol cream (TEMOVATE) 9.37 % Apply 1 application topically 2 (two) times daily as needed (rash). Apply to rash   estradiol (ESTRACE) 1 MG tablet TAKE 1 TABLET(1 MG) BY MOUTH DAILY   EVENING PRIMROSE OIL PO Take by mouth 2 (two) times daily.   feeding supplement (ENSURE CLINICAL STRENGTH) LIQD Take 237 mLs by mouth 3 (three) times daily with meals.   fluticasone (FLONASE) 50 MCG/ACT nasal spray SHAKE LIQUID AND USE 2 SPRAYS IN EACH NOSTRIL DAILY   gabapentin (NEURONTIN) 100 MG capsule Take 1 capsule (100 mg total) by mouth 3 (three) times daily.   LINZESS 290 MCG CAPS capsule TAKE 1 CAPSULE(290 MCG) BY MOUTH DAILY   loratadine (CLARITIN) 10 MG tablet Take 1 tablet (10 mg total) by mouth daily.   ondansetron (ZOFRAN ODT) 4 MG disintegrating tablet Take 1 tablet (4 mg total) by mouth every 8 (eight) hours as needed for nausea.   pantoprazole (PROTONIX) 40 MG tablet TAKE 1 TABLET BY MOUTH TWICE DAILY BEFORE A MEAL   pantoprazole (PROTONIX) 40 MG tablet TAKE 1 TABLET(40 MG) BY MOUTH TWICE DAILY BEFORE A MEAL   Polyethyl Glycol-Propyl Glycol (SYSTANE OP) Place 2 drops into the left eye 3 (three) times daily.   potassium chloride SA (K-DUR,KLOR-CON) 20 MEQ tablet One tablet once daily by mouth   potassium chloride SA (K-DUR,KLOR-CON) 20 MEQ tablet TAKE 1 TABLET(20 MEQ) BY MOUTH DAILY   pravastatin (PRAVACHOL) 20 MG tablet Take 1 tablet (20 mg total) by mouth daily.    pravastatin (PRAVACHOL) 20 MG tablet TAKE 1 TABLET(20 MG) BY MOUTH DAILY   PREMARIN vaginal cream INSERT 1.69 APPLICATORFUL VAGINALLY THREE TIMES WEEKLY AS DIRECTED   progesterone (PROMETRIUM) 200 MG capsule TAKE 1 CAPSULE BY MOUTH EVERY DAY FOR 2 WEEKS EVERY THIRD MONTH: JANUARY, APRIL, JULY AND OCTOBER   propranolol (INDERAL) 10 MG tablet Take 1 tablet (10 mg total) by mouth 3 (three) times daily.   risperiDONE (RISPERDAL) 0.5 MG tablet Take 0.5 mg at bedtime by mouth.   sertraline (ZOLOFT) 50 MG tablet Take 50 mg daily by mouth.   sucralfate (CARAFATE) 1 g tablet TAKE 1 TABLET BY MOUTH EVERY MORNING AND AT BEDTIME AS NEEDED FOR STOMACH BURNING.( MAY CRUSH 1 TABLET AND MIX IN APPLESAUCE)   sulfamethoxazole-trimethoprim (BACTRIM DS,SEPTRA DS) 800-160 MG tablet Take 1 tablet by mouth 2 (two) times daily.   SYMBICORT 80-4.5 MCG/ACT inhaler INHALE 2 PUFFS INTO THE LUNGS TWICE DAILY   tiZANidine (ZANAFLEX) 4 MG tablet TAKE 1 TABLET BY MOUTH TWICE DAILY FOR BACK OR SPASM   VENTOLIN HFA 108 (90 Base) MCG/ACT inhaler INHALE 2 PUFFS INTO THE LUNGS EVERY 6 HOURS AS NEEDED FOR SHORTNESS OF BREATH   zonisamide (ZONEGRAN) 50 MG capsule Take 150 mg by mouth daily.   No facility-administered encounter medications on file as of 11/08/2017.     Activities of Daily Living In your present state of health, do you have any difficulty performing the following activities: 11/08/2017 11/09/2016  Hearing? Y N  Vision? Y N  Difficulty concentrating or making decisions? Y N  Walking or climbing stairs? Y N  Dressing or bathing? N N  Doing errands, shopping? N N  Preparing Food and eating ? N -  Using the Toilet? N -  In the past six months, have you accidently leaked urine? N -  Do you have problems with loss of bowel control? N -  Managing your Medications? N -  Managing your Finances? N -  Housekeeping or managing your Housekeeping? N -  Some recent data might be hidden    Patient Care  Team: [name], MD as PCP - General Rourk, Cristopher Estimable, MD (Gastroenterology) Whitney Muse Kelby Fam, MD (Inactive) as Consulting Physician (Hematology and Oncology) Scherrie November, MD as Referring Physician (Internal Medicine)    Assessment:   This is a routine wellness examination for Tracey Daniel.  Exercise Activities and Dietary recommendations Current Exercise Habits: Home exercise routine, Type of exercise: walking, Time (Minutes): 30, Frequency (Times/Week): 3, Weekly Exercise (Minutes/Week): 90, Intensity: Mild, Exercise limited by: None identified  Goals     DIET - INCREASE LEAN PROTEINS     Exercise 3x per week (30 min per time)     Recommend increasing your exercise program at least 3 days a week for 30-45 minutes at a time as tolerated.       Quit Smoking       Fall Risk Fall Risk  11/08/2017 08/02/2017 07/23/2016 06/04/2016 02/26/2015  Falls in the past year? No No No No No  Risk for fall due to : Impaired vision - - - -   Is the patient's home free of loose throw rugs in walkways, pet beds, electrical cords, etc?   yes      Grab bars in the bathroom? yes      Handrails on the stairs?   yes      Adequate lighting?   yes  Timed Get Up and Go performed: Patient able to perform in 8 seconds without assistance   Depression Screen PHQ 2/9 Scores 11/08/2017 08/02/2017 11/09/2016 07/23/2016  PHQ - 2 Score 0 2 0 0  PHQ- 9 Score - 2 - -     Cognitive Function     6CIT Screen 11/08/2017 06/04/2016  What Year? 0 points 0 points  What month? 0 points 0 points  What time? 0 points 0 points  Count back from 20 0 points 0 points  Months in reverse 0 points 0 points  Repeat phrase 2 points 0 points  Total Score 2 0    Immunization History  Administered Date(s) Administered   Influenza Split 10/30/2010, 10/09/2011   Influenza,inj,Quad PF,6+ Mos 11/14/2012, 12/13/2013, 09/18/2014, 10/02/2015, 11/23/2016   Pneumococcal Polysaccharide-23 08/21/2010   Td 06/20/2001    Tdap 08/21/2010    Qualifies for Shingles Vaccine? Not at this time   Screening Tests Health Maintenance  Topic Date Due   INFLUENZA VACCINE  08/19/2017   PAP SMEAR  05/25/2020   TETANUS/TDAP  08/20/2020   HIV Screening  Completed    Cancer Screenings: Lung: Low Dose CT Chest recommended if Age 67-80 years, 30 pack-year currently smoking OR have quit w/in 15years. Patient does qualify. Breast:  Up to date on Mammogram? Yes   Up to date of Bone Density/Dexa? No Colorectal: up to date   Additional Screenings:  Hepatitis C Screening: postponed      Plan:   Plan to continue exercise routine, stop smoking   I have personally reviewed and noted the following in the patients chart:    Medical and social history  Use of alcohol, tobacco or illicit drugs   Current medications and supplements  Functional ability and status  Nutritional status  Physical activity  Advanced directives  List of other physicians  Hospitalizations, surgeries, and ER visits in previous 12 months  Vitals  Screenings to include cognitive, depression, and falls  Referrals and appointments  In addition, I have reviewed and discussed with patient certain  preventive protocols, quality metrics, and best practice recommendations. A written personalized care plan for preventive services as well as general preventive health recommendations were provided to patient.     Duane Lope Rakes  11/08/2017

## 2017-11-08 NOTE — Patient Instructions (Addendum)
Tracey Daniel , Thank you for taking time to come for your Medicare Wellness Visit. I appreciate your ongoing commitment to your health goals. Please review the following plan we discussed and let me know if I can assist you in the future.   Screening recommendations/referrals: Colonoscopy: UP to date  Mammogram: up to date  Bone Density: N/A Recommended yearly ophthalmology/optometry visit for glaucoma screening and checkup Recommended yearly dental visit for hygiene and checkup  Vaccinations: Influenza vaccine: given to day  Pneumococcal vaccine:Up to date   Tdap vaccine: Up to date  Shingles vaccine: N/A   Advanced directives: Information given   Conditions/risks identified: Chronic back pain   Next appointment: Wellness visit in one year   Preventive Care 40-64 Years, Female Preventive care refers to lifestyle choices and visits with your health care provider that can promote health and wellness. What does preventive care include?  A yearly physical exam. This is also called an annual well check.  Dental exams once or twice a year.  Routine eye exams. Ask your health care provider how often you should have your eyes checked.  Personal lifestyle choices, including:  Daily care of your teeth and gums.  Regular physical activity.  Eating a healthy diet.  Avoiding tobacco and drug use.  Limiting alcohol use.  Practicing safe sex.  Taking low-dose aspirin daily starting at age 49.  Taking vitamin and mineral supplements as recommended by your health care provider. What happens during an annual well check? The services and screenings done by your health care provider during your annual well check will depend on your age, overall health, lifestyle risk factors, and family history of disease. Counseling  Your health care provider may ask you questions about your:  Alcohol use.  Tobacco use.  Drug use.  Emotional well-being.  Home and relationship  well-being.  Sexual activity.  Eating habits.  Work and work Statistician.  Method of birth control.  Menstrual cycle.  Pregnancy history. Screening  You may have the following tests or measurements:  Height, weight, and BMI.  Blood pressure.  Lipid and cholesterol levels. These may be checked every 5 years, or more frequently if you are over 28 years old.  Skin check.  Lung cancer screening. You may have this screening every year starting at age 39 if you have a 30-pack-year history of smoking and currently smoke or have quit within the past 15 years.  Fecal occult blood test (FOBT) of the stool. You may have this test every year starting at age 60.  Flexible sigmoidoscopy or colonoscopy. You may have a sigmoidoscopy every 5 years or a colonoscopy every 10 years starting at age 39.  Hepatitis C blood test.  Hepatitis B blood test.  Sexually transmitted disease (STD) testing.  Diabetes screening. This is done by checking your blood sugar (glucose) after you have not eaten for a while (fasting). You may have this done every 1-3 years.  Mammogram. This may be done every 1-2 years. Talk to your health care provider about when you should start having regular mammograms. This may depend on whether you have a family history of breast cancer.  BRCA-related cancer screening. This may be done if you have a family history of breast, ovarian, tubal, or peritoneal cancers.  Pelvic exam and Pap test. This may be done every 3 years starting at age 39. Starting at age 39, this may be done every 5 years if you have a Pap test in combination with an HPV test.  Bone density scan. This is done to screen for osteoporosis. You may have this scan if you are at high risk for osteoporosis. Discuss your test results, treatment options, and if necessary, the need for more tests with your health care provider. Vaccines  Your health care provider may recommend certain vaccines, such  as:  Influenza vaccine. This is recommended every year.  Tetanus, diphtheria, and acellular pertussis (Tdap, Td) vaccine. You may need a Td booster every 10 years.  Zoster vaccine. You may need this after age 39.  Pneumococcal 13-valent conjugate (PCV13) vaccine. You may need this if you have certain conditions and were not previously vaccinated.  Pneumococcal polysaccharide (PPSV23) vaccine. You may need one or two doses if you smoke cigarettes or if you have certain conditions. Talk to your health care provider about which screenings and vaccines you need and how often you need them. This information is not intended to replace advice given to you by your health care provider. Make sure you discuss any questions you have with your health care provider. Document Released: 02/01/2015 Document Revised: 09/25/2015 Document Reviewed: 11/06/2014 Elsevier Interactive Patient Education  2017 [place] Prevention in the Home Falls can cause injuries. They can happen to people of all ages. There are many things you can do to make your home safe and to help prevent falls. What can I do on the outside of my home?  Regularly fix the edges of walkways and driveways and fix any cracks.  Remove anything that might make you trip as you walk through a door, such as a raised step or threshold.  Trim any bushes or trees on the path to your home.  Use bright outdoor lighting.  Clear any walking paths of anything that might make someone trip, such as rocks or tools.  Regularly check to see if handrails are loose or broken. Make sure that both sides of any steps have handrails.  Any raised decks and porches should have guardrails on the edges.  Have any leaves, snow, or ice cleared regularly.  Use sand or salt on walking paths during winter.  Clean up any spills in your garage right away. This includes oil or grease spills. What can I do in the bathroom?  Use night  lights.  Install grab bars by the toilet and in the tub and shower. Do not use towel bars as grab bars.  Use non-skid mats or decals in the tub or shower.  If you need to sit down in the shower, use a plastic, non-slip stool.  Keep the floor dry. Clean up any water that spills on the floor as soon as it happens.  Remove soap buildup in the tub or shower regularly.  Attach bath mats securely with double-sided non-slip rug tape.  Do not have throw rugs and other things on the floor that can make you trip. What can I do in the bedroom?  Use night lights.  Make sure that you have a light by your bed that is easy to reach.  Do not use any sheets or blankets that are too big for your bed. They should not hang down onto the floor.  Have a firm chair that has side arms. You can use this for support while you get dressed.  Do not have throw rugs and other things on the floor that can make you trip. What can I do in the kitchen?  Clean up any spills right away.  Avoid walking on  wet floors.  Keep items that you use a lot in easy-to-reach places.  If you need to reach something above you, use a strong step stool that has a grab bar.  Keep electrical cords out of the way.  Do not use floor polish or wax that makes floors slippery. If you must use wax, use non-skid floor wax.  Do not have throw rugs and other things on the floor that can make you trip. What can I do with my stairs?  Do not leave any items on the stairs.  Make sure that there are handrails on both sides of the stairs and use them. Fix handrails that are broken or loose. Make sure that handrails are as long as the stairways.  Check any carpeting to make sure that it is firmly attached to the stairs. Fix any carpet that is loose or worn.  Avoid having throw rugs at the top or bottom of the stairs. If you do have throw rugs, attach them to the floor with carpet tape.  Make sure that you have a light switch at the  top of the stairs and the bottom of the stairs. If you do not have them, ask someone to add them for you. What else can I do to help prevent falls?  Wear shoes that:  Do not have high heels.  Have rubber bottoms.  Are comfortable and fit you well.  Are closed at the toe. Do not wear sandals.  If you use a stepladder:  Make sure that it is fully opened. Do not climb a closed stepladder.  Make sure that both sides of the stepladder are locked into place.  Ask someone to hold it for you, if possible.  Clearly mark and make sure that you can see:  Any grab bars or handrails.  First and last steps.  Where the edge of each step is.  Use tools that help you move around (mobility aids) if they are needed. These include:  Canes.  Walkers.  Scooters.  Crutches.  Turn on the lights when you go into a dark area. Replace any light bulbs as soon as they burn out.  Set up your furniture so you have a clear path. Avoid moving your furniture around.  If any of your floors are uneven, fix them.  If there are any pets around you, be aware of where they are.  Review your medicines with your doctor. Some medicines can make you feel dizzy. This can increase your chance of falling. Ask your doctor what other things that you can do to help prevent falls. This information is not intended to replace advice given to you by your health care provider. Make sure you discuss any questions you have with your health care provider. Document Released: 11/01/2008 Document Revised: 06/13/2015 Document Reviewed: 02/09/2014 Elsevier Interactive Patient Education  2017 [employer].

## 2017-11-17 DIAGNOSIS — F333 Major depressive disorder, recurrent, severe with psychotic symptoms: Secondary | ICD-10-CM | POA: Diagnosis not present

## 2017-11-18 ENCOUNTER — Other Ambulatory Visit: Payer: Self-pay | Admitting: Family Medicine

## 2017-11-18 DIAGNOSIS — J45909 Unspecified asthma, uncomplicated: Secondary | ICD-10-CM

## 2017-12-07 DIAGNOSIS — M791 Myalgia, unspecified site: Secondary | ICD-10-CM | POA: Diagnosis not present

## 2017-12-07 DIAGNOSIS — G518 Other disorders of facial nerve: Secondary | ICD-10-CM | POA: Diagnosis not present

## 2017-12-07 DIAGNOSIS — M542 Cervicalgia: Secondary | ICD-10-CM | POA: Diagnosis not present

## 2017-12-07 DIAGNOSIS — R51 Headache: Secondary | ICD-10-CM | POA: Diagnosis not present

## 2017-12-07 DIAGNOSIS — G43719 Chronic migraine without aura, intractable, without status migrainosus: Secondary | ICD-10-CM | POA: Diagnosis not present

## 2017-12-15 ENCOUNTER — Other Ambulatory Visit: Payer: Self-pay | Admitting: Family Medicine

## 2018-01-10 ENCOUNTER — Encounter: Payer: Self-pay | Admitting: *Deleted

## 2018-01-20 DIAGNOSIS — M542 Cervicalgia: Secondary | ICD-10-CM | POA: Diagnosis not present

## 2018-01-20 DIAGNOSIS — G43719 Chronic migraine without aura, intractable, without status migrainosus: Secondary | ICD-10-CM | POA: Diagnosis not present

## 2018-01-20 DIAGNOSIS — G518 Other disorders of facial nerve: Secondary | ICD-10-CM | POA: Diagnosis not present

## 2018-01-20 DIAGNOSIS — M791 Myalgia, unspecified site: Secondary | ICD-10-CM | POA: Diagnosis not present

## 2018-01-29 ENCOUNTER — Other Ambulatory Visit: Payer: Self-pay | Admitting: Gastroenterology

## 2018-01-29 DIAGNOSIS — K5909 Other constipation: Secondary | ICD-10-CM

## 2018-01-29 DIAGNOSIS — K313 Pylorospasm, not elsewhere classified: Secondary | ICD-10-CM

## 2018-01-29 DIAGNOSIS — K3184 Gastroparesis: Secondary | ICD-10-CM

## 2018-01-29 DIAGNOSIS — K219 Gastro-esophageal reflux disease without esophagitis: Secondary | ICD-10-CM

## 2018-02-01 ENCOUNTER — Ambulatory Visit (INDEPENDENT_AMBULATORY_CARE_PROVIDER_SITE_OTHER): Payer: Medicare HMO | Admitting: Family Medicine

## 2018-02-01 ENCOUNTER — Encounter: Payer: Self-pay | Admitting: Family Medicine

## 2018-02-01 VITALS — BP 110/80 | HR 83 | Resp 15 | Ht 66.0 in | Wt 156.1 lb

## 2018-02-01 DIAGNOSIS — F329 Major depressive disorder, single episode, unspecified: Secondary | ICD-10-CM

## 2018-02-01 DIAGNOSIS — F17218 Nicotine dependence, cigarettes, with other nicotine-induced disorders: Secondary | ICD-10-CM

## 2018-02-01 DIAGNOSIS — L309 Dermatitis, unspecified: Secondary | ICD-10-CM

## 2018-02-01 DIAGNOSIS — F32A Depression, unspecified: Secondary | ICD-10-CM

## 2018-02-01 DIAGNOSIS — M549 Dorsalgia, unspecified: Secondary | ICD-10-CM | POA: Diagnosis not present

## 2018-02-01 DIAGNOSIS — K219 Gastro-esophageal reflux disease without esophagitis: Secondary | ICD-10-CM

## 2018-02-01 DIAGNOSIS — M25551 Pain in right hip: Secondary | ICD-10-CM | POA: Diagnosis not present

## 2018-02-01 MED ORDER — KETOROLAC TROMETHAMINE 60 MG/2ML IM SOLN
60.0000 mg | Freq: Once | INTRAMUSCULAR | Status: AC
  Administered 2018-02-01: 60 mg via INTRAMUSCULAR

## 2018-02-01 MED ORDER — METHYLPREDNISOLONE ACETATE 80 MG/ML IJ SUSP
80.0000 mg | Freq: Once | INTRAMUSCULAR | Status: AC
  Administered 2018-02-01: 80 mg via INTRAMUSCULAR

## 2018-02-01 MED ORDER — PREDNISONE 5 MG PO TABS: ORAL_TABLET | ORAL | 0 refills | Status: DC

## 2018-02-01 NOTE — Progress Notes (Signed)
   Tracey Daniel     MRN: [id number]      DOB: 23-Jul-1980   HPI Ms. Tracey Daniel is here for follow up and re-evaluation of chronic medical conditions, medication management and review of any available recent lab and radiology data.  Preventive health is updated, specifically  Cancer screening and Immunization.   Questions or concerns regarding consultations or procedures which the PT has had in the interim are  addressed. The PT denies any adverse reactions to current medications since the last visit.  C/O  Mid and lower BACK PIAN RATED AT A 10 RADIATING TO left side X over 6 months, worse in the last 2 months, rated at a 10, disturbs sleep. No aggravating or relieving factors, also c/o bilateral hip pain, left greater than right, was being treated with of pain meds by Ortho , but positive for marijuana, so stopped Pruritic skin rash worsening and spreading , present for years, needs to return to derm ROS Denies recent fever or chills. Denies sinus pressure, nasal congestion, ear pain or sore throat. Denies chest congestion, productive cough or wheezing. Denies chest pains, palpitations and leg swelling Denies abdominal pain, nausea, vomiting,diarrhea or constipation.   Denies dysuria, frequency, hesitancy or incontinence. Denies headaches, seizures, numbness, or tingling. Denies uncontrolled depression, anxiety or insomnia. Marland Kitchen   PE  BP 98/62   Pulse 83   Resp 15   Ht 5\' 6"  (1.676 m)   Wt 156 lb 1.9 oz (70.8 kg)   SpO2 99%   BMI 25.20 kg/m   Patient alert and oriented and in no cardiopulmonary distress.  HEENT: No facial asymmetry, EOMI,   oropharynx pink and moist.  Neck supple no JVD, no mass.  Chest: Clear to auscultation bilaterally.  CVS: S1, S2 no murmurs, no S3.Regular rate.  ABD: Soft non tender.   Ext: No edema  BL:[id number] ROM thoracolumbar spine spine, shoulders, hips and knees.  Skin: Intact, minor hyperpigmented macular  rash noted.  Psych: Good eye  contact, normal affect. Memory intact not anxious or depressed appearing.  CNS: CN 2-12 intact, power,  normal throughout.no focal deficits noted.   Assessment & Plan  Hip pain, right iNCCREASED HIP AND LOW BACK PAIN REFER TO ORTHO IN   Back pain with radiation Uncontrolled.Toradol and depo medrol administered IM in the office , to be followed by a short course of oral prednisone and NSAIDS. sTART GABAPENTIN AT BEDTIME  Nicotine dependence, cigarettes, with other nicotine-induced disorders Asked:confirms currently smokes2  cigarettes Assess: Quit date is today Advise: needs to QUIT to reduce risk of cancer, cardio and cerebrovascular disease Assist: counseled for 5 minutes and literature provided Arrange: follow up in 3 months   Depression Controlled and managed by Psych  Dermatitis Worsening hyperpigmented macular rash with irregular border x years, pruritic, refer to derm for re eval  GERD (gastroesophageal reflux disease) Managed by GI and controlled

## 2018-02-01 NOTE — Assessment & Plan Note (Signed)
Uncontrolled.Toradol and depo medrol administered IM in the office , to be followed by a short course of oral prednisone and NSAIDS. sTART GABAPENTIN AT BEDTIME

## 2018-02-01 NOTE — Assessment & Plan Note (Addendum)
Asked:confirms currently smokes2  cigarettes Assess: Quit date is today Advise: needs to QUIT to reduce risk of cancer, cardio and cerebrovascular disease Assist: counseled for 5 minutes and literature provided Arrange: follow up in 3 months

## 2018-02-01 NOTE — Assessment & Plan Note (Signed)
Worsening hyperpigmented macular rash with irregular border x years, pruritic, refer to derm for re eval

## 2018-02-01 NOTE — Patient Instructions (Addendum)
F/u in 5 months, call if you need me sooner.  Injections in office today for uncontrolled back and hip pain, Toradol 60 mg IM and depo Medrol 80 mg IM, followed by 5 day course of prednisone. You are also referred to Orthopedics in [place] for re evaluation  You are referred to dermatology re skin rash  Congrats on deciding top QUIT smoking today, this will improve your health   Steps to Quit Smoking  Smoking tobacco can be bad for your health. It can also affect almost every organ in your body. Smoking puts you and people around you at risk for many serious long-lasting (chronic) diseases. Quitting smoking is hard, but it is one of the best things that you can do for your health. It is never too late to quit. What are the benefits of quitting smoking? When you quit smoking, you lower your risk for getting serious diseases and conditions. They can include:  Lung cancer or lung disease.  Heart disease.  Stroke.  Heart attack.  Not being able to have children (infertility).  Weak bones (osteoporosis) and broken bones (fractures). If you have coughing, wheezing, and shortness of breath, those symptoms may get better when you quit. You may also get sick less often. If you are pregnant, quitting smoking can help to lower your chances of having a baby of low birth weight. What can I do to help me quit smoking? Talk with your doctor about what can help you quit smoking. Some things you can do (strategies) include:  Quitting smoking totally, instead of slowly cutting back how much you smoke over a period of time.  Going to in-person counseling. You are more likely to quit if you go to many counseling sessions.  Using resources and support systems, such as: ? Database administrator with a Social worker. ? Phone quitlines. ? Careers information officer. ? Support groups or group counseling. ? Text messaging programs. ? Mobile phone apps or applications.  Taking medicines. Some of these  medicines may have nicotine in them. If you are pregnant or breastfeeding, do not take any medicines to quit smoking unless your doctor says it is okay. Talk with your doctor about counseling or other things that can help you. Talk with your doctor about using more than one strategy at the same time, such as taking medicines while you are also going to in-person counseling. This can help make quitting easier. What things can I do to make it easier to quit? Quitting smoking might feel very hard at first, but there is a lot that you can do to make it easier. Take these steps:  Talk to your family and friends. Ask them to support and encourage you.  Call phone quitlines, reach out to support groups, or work with a Social worker.  Ask people who smoke to not smoke around you.  Avoid places that make you want (trigger) to smoke, such as: ? Bars. ? Parties. ? Smoke-break areas at work.  Spend time with people who do not smoke.  Lower the stress in your life. Stress can make you want to smoke. Try these things to help your stress: ? Getting regular exercise. ? Deep-breathing exercises. ? Yoga. ? Meditating. ? Doing a body scan. To do this, close your eyes, focus on one area of your body at a time from head to toe, and notice which parts of your body are tense. Try to relax the muscles in those areas.  Download or buy apps on your mobile phone  or tablet that can help you stick to your quit plan. There are many free apps, such as QuitGuide from the [employer] Office manager for Disease Control and Prevention). You can find more support from smokefree.gov and other websites. This information is not intended to replace advice given to you by your health care provider. Make sure you discuss any questions you have with your health care provider. Document Released: 11/01/2008 Document Revised: 09/03/2015 Document Reviewed: 05/22/2014 Elsevier Interactive Patient Education  2019 [employer].

## 2018-02-01 NOTE — Assessment & Plan Note (Signed)
Controlled and managed by Psych 

## 2018-02-01 NOTE — Assessment & Plan Note (Signed)
iNCCREASED HIP AND LOW BACK PAIN REFER TO ORTHO IN [place]

## 2018-02-07 ENCOUNTER — Encounter: Payer: Self-pay | Admitting: Internal Medicine

## 2018-02-08 ENCOUNTER — Encounter (INDEPENDENT_AMBULATORY_CARE_PROVIDER_SITE_OTHER): Payer: Self-pay | Admitting: Orthopaedic Surgery

## 2018-02-08 ENCOUNTER — Ambulatory Visit (INDEPENDENT_AMBULATORY_CARE_PROVIDER_SITE_OTHER): Payer: Self-pay

## 2018-02-08 ENCOUNTER — Other Ambulatory Visit (INDEPENDENT_AMBULATORY_CARE_PROVIDER_SITE_OTHER): Payer: Self-pay | Admitting: Physician Assistant

## 2018-02-08 ENCOUNTER — Other Ambulatory Visit (INDEPENDENT_AMBULATORY_CARE_PROVIDER_SITE_OTHER): Payer: Self-pay

## 2018-02-08 ENCOUNTER — Ambulatory Visit (INDEPENDENT_AMBULATORY_CARE_PROVIDER_SITE_OTHER): Payer: Medicare HMO | Admitting: Orthopaedic Surgery

## 2018-02-08 DIAGNOSIS — M2170 Unequal limb length (acquired), unspecified site: Secondary | ICD-10-CM

## 2018-02-08 DIAGNOSIS — G8929 Other chronic pain: Secondary | ICD-10-CM

## 2018-02-08 DIAGNOSIS — M546 Pain in thoracic spine: Secondary | ICD-10-CM

## 2018-02-08 DIAGNOSIS — M5441 Lumbago with sciatica, right side: Secondary | ICD-10-CM

## 2018-02-08 DIAGNOSIS — M549 Dorsalgia, unspecified: Secondary | ICD-10-CM | POA: Diagnosis not present

## 2018-02-08 DIAGNOSIS — M7061 Trochanteric bursitis, right hip: Secondary | ICD-10-CM

## 2018-02-08 MED ORDER — GABAPENTIN 100 MG PO CAPS: 200.0000 mg | ORAL_CAPSULE | Freq: Three times a day (TID) | ORAL | 0 refills | Status: DC

## 2018-02-08 MED ORDER — DICLOFENAC SODIUM 75 MG PO TBEC: 75.0000 mg | DELAYED_RELEASE_TABLET | Freq: Two times a day (BID) | ORAL | 0 refills | Status: DC

## 2018-02-08 NOTE — Progress Notes (Signed)
Office Visit Note   Patient: Tracey Daniel           Date of Birth: Mar 02, 1980           MRN: [id number] Visit Date: 02/08/2018              Requested by: [name], MD [address] [place], [place] [address] PCP: [name], MD   Assessment & Plan: Visit Diagnoses:  1. Mid back pain   2. Chronic right-sided low back pain with right-sided sciatica   3. Trochanteric bursitis, right hip   4. Leg length discrepancy     Plan: Plan we will increase her Neurontin to 200 mg 3 times a day.  She is also placed on diclofenac she is take no other NSAIDs while on this and she understands this.  We will send her to formal physical therapy for IT band stretching for trochanteric bursitis, core strengthening, range of motion of her T-spine and L-spine, modalities and home exercise program.  See her back in 1 month to check her response.  She is given a prescription for a heel lift for her left shoe due to her leg length discrepancy.  Questions were encouraged and answered at length.  Follow-Up Instructions: Return in about 4 weeks (around 03/08/2018).   Orders:  Orders Placed This Encounter  Procedures  . XR Thoracic Spine 2 View  . XR Lumbar Spine 2-3 Views   Meds ordered this encounter  Medications  . gabapentin (NEURONTIN) 100 MG capsule    Sig: Take 2 capsules (200 mg total) by mouth 3 (three) times daily.    Dispense:  180 capsule    Refill:  0  . diclofenac (VOLTAREN) 75 MG EC tablet    Sig: Take 1 tablet (75 mg total) by mouth 2 (two) times daily.    Dispense:  60 tablet    Refill:  0      Procedures: No procedures performed   Clinical Data: No additional findings.   Subjective: Chief Complaint  Patient presents with  . Middle Back - Pain  . Lower Back - Pain    HPI Tracey Daniel is a 38 year old female comes in today with right hip pain and back pain.  She states she has been seen in the past for scoliosis and has a leg length  discrepancy.  She has pain that radiates down her right leg that she got as a electrical achy pain.  Pain does awaken her at night.  She states whenever she rolls over on the right hip that she has pain.  Some discomfort with rolling over on the left hip.  She is recently been on prednisone Dosepak which she finished this past Saturday helped some.  She is taken 100 mg of Neurontin 3 times a day and feels this is helped.  She is on Zanaflex and this helps also.  History of MRI of lumbar spine in 2016 which showed right L4-5 facet degenerative changes with an extra spinal synovial cyst she underwent epidural steroid injections for this and this definitely helped with her back.  She is also been to therapy in the past which helped with her back but she does not perform any home exercise program currently.  She denies any bowel or bladder dysfunction.  She is having pain in her mid thoracic spine.  Denies any radicular symptoms either arm.  She denies any recent injury to her back or hips.  She has a  mother who has dementia and she has to do a lot of lifting on her though. Review of Systems  Constitutional: Negative for fever.  Gastrointestinal: Negative for constipation and diarrhea.  Genitourinary: Negative for difficulty urinating and frequency.  Musculoskeletal: Positive for back pain.     Objective: Vital Signs: There were no vitals taken for this visit.  Physical Exam Constitutional:      General: She is not in acute distress.    Appearance: She is normal weight. She is not ill-appearing or diaphoretic.  Cardiovascular:     Pulses: Normal pulses.  Neurological:     Mental Status: She is alert and oriented to person, place, and time.  Psychiatric:        Mood and Affect: Mood normal.        Behavior: Behavior normal.     Ortho Exam Lumbar spine tenderness over the mid to lower lumbar spinal column.  Is able to touch her toes with forward flexion limited extension which causes discomfort.   She is able to walk on her tiptoes and heels.  Negative straight leg raise bilaterally.  5 out of 5 strength throughout lower extremities against resistance deep tendon reflexes are 2+ at the knees and ankles and equal and symmetric.  Sensation grossly intact bilateral feet.  Thoracic spine tenderness over the mid thoracic spinal column.  Bilateral hips excellent range of motion without pain.  Nontender leg trochanteric region.  Tender over the right trochanteric region.  She has a leg length discrepancy with the left hip being lower than the right.  Specialty Comments:  No specialty comments available.  Imaging: Xr Thoracic Spine 2 View  Result Date: 02/08/2018 Thoracic spine: Mild scoliosis with convexity to the left mid T-spine to upper lumbar spine.  Disc space overall well-maintained.  No acute fractures.  No spondylolisthesis.  No bony malleus otherwise.  Lung fields appear clear.  Xr Lumbar Spine 2-3 Views  Result Date: 02/08/2018 Lumbar spine AP and lateral views: No acute fracture.  Mild scoliosis of the proximal lumbar thoracic junction.  No spondylolisthesis.  Disc space overall well-maintained.  No bony abnormalities.  Hips are seen on the AP view and shows bilateral hips to be well preserved and located.    PMFS History: Patient Active Problem List   Diagnosis Date Noted  . Dermatitis 02/01/2018  . Hemorrhoids 02/22/2017  . Nicotine dependence, cigarettes, with other nicotine-induced disorders 12/12/2016  . Thoracic spine pain 11/23/2016  . Nasal obstruction 11/23/2016  . Hyperlipemia 10/02/2015  . Premature ovarian failure 02/04/2015  . Pregnancy as incidental finding, low positive preg test, considered pituitary production of HCG 02/04/2015  . Abdominal pain, epigastric 08/27/2014  . Vitamin D deficiency 05/16/2014  . Alopecia 08/01/2013  . Asthma, cough variant 12/01/2012  . 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 Marijuana use 11/14/2012  .  Seasonal allergies 11/14/2012  . Nausea without vomiting 11/09/2012  . Constipation 12/02/2011  . Gastroparesis 05/01/2011  . Pyloric spasm 03/30/2011  . Hip pain, right 03/02/2011  . Anemia 01/30/2011  . Gastric outlet obstruction 01/01/2011  . GERD (gastroesophageal reflux disease) 08/21/2010  . Headache 03/13/2008  . AMENORRHEA, SECONDARY 09/27/2007  . ECZEMA 09/27/2007  . Depression 07/01/2007  . Back pain with radiation 07/01/2007  . CLOSED FRACTURE OF METATARSAL BONE 01/27/2007   Past Medical History:  Diagnosis Date  . Anemia of other chronic disease 11/29/2012  . Asthma   . Back pain, chronic   . Chronic abdominal pain   .  Constipation   . COPD (chronic obstructive pulmonary disease) ([place])   . Cyst of skin    mid spine area  . Depression 2000   h/o suicidal ideation  . Gastric outlet obstruction   . Gastritis   . Gastroparesis   . Migraine   . Migraines   . Nausea and vomiting    recurrent  . Nicotine addiction   . Osteoporosis   . Peptic ulcer disease   . Pyloric spasm 03/30/2011  . Seasonal allergies   . Sinusitis   . Substance abuse ([place]) 2008   marijuana  . Vitamin D deficiency     Family History  Problem Relation Age of Onset  . Diabetes Father   . Liver disease Father        liver transplant at [facility], age 75  . Lung cancer Mother 67  . Heart disease Maternal Grandmother   . Parkinson's disease Maternal Grandfather   . Multiple sclerosis Sister 67  . Depression Sister 15  . Alcohol abuse Sister   . Bipolar disorder Sister   . Schizophrenia Sister   . Colon cancer Neg Hx     Past Surgical History:  Procedure Laterality Date  . BALLOON DILATION N/A 02/09/2013   Procedure: BALLOON DILATION;  Surgeon: Missy Sabins, MD;  Location: [facility];  Service: Endoscopy;  Laterality: N/A;  . BOTOX INJECTION N/A 02/09/2013   Procedure: BOTOX INJECTION;  Surgeon: Missy Sabins, MD;  Location: [facility];  Service: Endoscopy;  Laterality: N/A;  possible  balloon  . BOTOX INJECTION N/A 10/24/2015   Procedure: BOTOX INJECTION;  Surgeon: Daneil Dolin, MD;  Location: [facility];  Service: Endoscopy;  Laterality: N/A;  . CARPAL TUNNEL RELEASE     left hand  . CHOLECYSTECTOMY     ?2002  . COLONOSCOPY WITH PROPOFOL N/A 09/20/2014   RMR: Normal ileo-colonoscopy  . ESOPHAGEAL DILATION  12/25/2010   Procedure: ESOPHAGEAL DILATION;  Surgeon: Dorothyann Peng, MD;  Location: [facility];  Service: Endoscopy;;  . ESOPHAGOGASTRODUODENOSCOPY  12/25/2010   Dorothyann Peng, MD;  moderate gastritis, ?goo secondary to pylorspasm. BX showed reactive gstropathy no h.pyori, SB mucosa with intramucosal lymphocytosis and partial villous blunting (TTG 4.0 normal)  . ESOPHAGOGASTRODUODENOSCOPY N/A 11/14/2012   GYI:RSWNI hiatal hernia. Abnormal gastric mucosa of  uncertain significance-status post biopsy. Subjectively, patient may have recurrent symptomatic, pylorospasm.  . ESOPHAGOGASTRODUODENOSCOPY (EGD) WITH PROPOFOL N/A 02/09/2013   elongated stomach, partial lower esophageal ring widely patent. No obvious pyloric stenosis s/p Botox  . ESOPHAGOGASTRODUODENOSCOPY (EGD) WITH PROPOFOL N/A 10/24/2015   Procedure: ESOPHAGOGASTRODUODENOSCOPY (EGD) WITH PROPOFOL;  Surgeon: Daneil Dolin, MD;  Location: [facility];  Service: Endoscopy;  Laterality: N/A;  830   . laser surgery on cervix    . NASAL SEPTUM SURGERY  01/29/2017  . TOE SURGERY     Social History   Occupational History  . Occupation: disability    Employer: UNEMPLOYED  Tobacco Use  . Smoking status: Current Every Day Smoker    Packs/day: 0.10    Years: 15.00    Pack years: 1.50    Types: Cigarettes  . Smokeless tobacco: Never Used  . Tobacco comment: 2 per day  Substance and Sexual Activity  . Alcohol use: No    Alcohol/week: 0.0 standard drinks  . Drug use: No  . Sexual activity: Yes    Birth control/protection: None

## 2018-02-15 DIAGNOSIS — F333 Major depressive disorder, recurrent, severe with psychotic symptoms: Secondary | ICD-10-CM | POA: Diagnosis not present

## 2018-02-16 ENCOUNTER — Emergency Department (HOSPITAL_COMMUNITY): Payer: Medicare HMO

## 2018-02-16 ENCOUNTER — Emergency Department (HOSPITAL_COMMUNITY)
Admission: EM | Admit: 2018-02-16 | Discharge: 2018-02-16 | Disposition: A | Discharge status: Home / Self Care | Payer: Medicare HMO | Attending: Emergency Medicine | Admitting: Emergency Medicine

## 2018-02-16 ENCOUNTER — Encounter (HOSPITAL_COMMUNITY): Payer: Self-pay | Admitting: Emergency Medicine

## 2018-02-16 ENCOUNTER — Ambulatory Visit: Payer: Medicare HMO | Admitting: Physical Therapy

## 2018-02-16 ENCOUNTER — Other Ambulatory Visit: Payer: Self-pay

## 2018-02-16 DIAGNOSIS — R1084 Generalized abdominal pain: Secondary | ICD-10-CM | POA: Insufficient documentation

## 2018-02-16 DIAGNOSIS — K3184 Gastroparesis: Secondary | ICD-10-CM

## 2018-02-16 DIAGNOSIS — R0602 Shortness of breath: Secondary | ICD-10-CM | POA: Diagnosis not present

## 2018-02-16 DIAGNOSIS — Z91040 Latex allergy status: Secondary | ICD-10-CM | POA: Diagnosis not present

## 2018-02-16 DIAGNOSIS — F17210 Nicotine dependence, cigarettes, uncomplicated: Secondary | ICD-10-CM | POA: Diagnosis not present

## 2018-02-16 DIAGNOSIS — R112 Nausea with vomiting, unspecified: Secondary | ICD-10-CM

## 2018-02-16 DIAGNOSIS — K313 Pylorospasm, not elsewhere classified: Secondary | ICD-10-CM

## 2018-02-16 DIAGNOSIS — Z79899 Other long term (current) drug therapy: Secondary | ICD-10-CM | POA: Diagnosis not present

## 2018-02-16 DIAGNOSIS — R079 Chest pain, unspecified: Secondary | ICD-10-CM | POA: Diagnosis not present

## 2018-02-16 DIAGNOSIS — K5909 Other constipation: Secondary | ICD-10-CM

## 2018-02-16 DIAGNOSIS — J449 Chronic obstructive pulmonary disease, unspecified: Secondary | ICD-10-CM | POA: Insufficient documentation

## 2018-02-16 DIAGNOSIS — K219 Gastro-esophageal reflux disease without esophagitis: Secondary | ICD-10-CM

## 2018-02-16 DIAGNOSIS — R109 Unspecified abdominal pain: Secondary | ICD-10-CM | POA: Diagnosis present

## 2018-02-16 LAB — I-STAT TROPONIN, ED: Troponin i, poc: 0 ng/mL (ref 0.00–0.08)

## 2018-02-16 LAB — COMPREHENSIVE METABOLIC PANEL
ALT: 25 U/L (ref 0–44)
ANION GAP: 16 — AB (ref 5–15)
AST: 47 U/L — ABNORMAL HIGH (ref 15–41)
Albumin: 5.5 g/dL — ABNORMAL HIGH (ref 3.5–5.0)
Alkaline Phosphatase: 66 U/L (ref 38–126)
BUN: 24 mg/dL — ABNORMAL HIGH (ref 6–20)
CO2: 21 mmol/L — ABNORMAL LOW (ref 22–32)
Calcium: 10 mg/dL (ref 8.9–10.3)
Chloride: 101 mmol/L (ref 98–111)
Creatinine, Ser: 0.88 mg/dL (ref 0.44–1.00)
GFR calc Af Amer: >60 mL/min (ref >60)
GFR calc non Af Amer: >60 mL/min (ref >60)
Glucose, Bld: 155 mg/dL — ABNORMAL HIGH (ref 70–99)
Potassium: 2.9 mmol/L — ABNORMAL LOW (ref 3.5–5.1)
SODIUM: 138 mmol/L (ref 135–145)
TOTAL PROTEIN: 9 g/dL — AB (ref 6.5–8.1)
Total Bilirubin: 0.9 mg/dL (ref 0.3–1.2)

## 2018-02-16 LAB — URINALYSIS, ROUTINE W REFLEX MICROSCOPIC
BILIRUBIN URINE: NEGATIVE
Glucose, UA: 50 mg/dL — AB
Ketones, ur: 20 mg/dL — AB
Leukocytes, UA: NEGATIVE
Nitrite: NEGATIVE
Protein, ur: >=300 mg/dL — AB
SPECIFIC GRAVITY, URINE: 1.031 — AB (ref 1.005–1.030)
pH: 6 (ref 5.0–8.0)

## 2018-02-16 LAB — POC OCCULT BLOOD, ED: FECAL OCCULT BLD: NEGATIVE

## 2018-02-16 LAB — CBC
HCT: 39.4 % (ref 36.0–46.0)
Hemoglobin: 12.7 g/dL (ref 12.0–15.0)
MCH: 30.2 pg (ref 26.0–34.0)
MCHC: 32.2 g/dL (ref 30.0–36.0)
MCV: 93.6 fL (ref 80.0–100.0)
PLATELETS: 184 10*3/uL (ref 150–400)
RBC: 4.21 MIL/uL (ref 3.87–5.11)
RDW: 13.4 % (ref 11.5–15.5)
WBC: 25.5 10*3/uL — ABNORMAL HIGH (ref 4.0–10.5)
nRBC: 0 % (ref 0.0–0.2)

## 2018-02-16 LAB — LIPASE, BLOOD: Lipase: 23 U/L (ref 11–51)

## 2018-02-16 LAB — I-STAT BETA HCG BLOOD, ED (MC, WL, AP ONLY): I-stat hCG, quantitative: <5 m[IU]/mL (ref <5)

## 2018-02-16 MED ORDER — ONDANSETRON HCL 4 MG/2ML IJ SOLN
4.0000 mg | Freq: Once | INTRAMUSCULAR | Status: AC
  Administered 2018-02-16: 4 mg via INTRAVENOUS
  Filled 2018-02-16: qty 2

## 2018-02-16 MED ORDER — SODIUM CHLORIDE 0.9 % IV BOLUS
1000.0000 mL | Freq: Once | INTRAVENOUS | Status: AC
  Administered 2018-02-16: 1000 mL via INTRAVENOUS

## 2018-02-16 MED ORDER — FAMOTIDINE IN NACL 20-0.9 MG/50ML-% IV SOLN
20.0000 mg | Freq: Once | INTRAVENOUS | Status: AC
  Administered 2018-02-16: 20 mg via INTRAVENOUS
  Filled 2018-02-16: qty 50

## 2018-02-16 MED ORDER — ONDANSETRON 4 MG PO TBDP: 4.0000 mg | ORAL_TABLET | Freq: Three times a day (TID) | ORAL | 0 refills | Status: DC | PRN

## 2018-02-16 MED ORDER — HYDROMORPHONE HCL 1 MG/ML IJ SOLN
0.5000 mg | Freq: Once | INTRAMUSCULAR | Status: AC
  Administered 2018-02-16: 0.5 mg via INTRAVENOUS
  Filled 2018-02-16: qty 1

## 2018-02-16 MED ORDER — IOPAMIDOL (ISOVUE-300) INJECTION 61%
100.0000 mL | Freq: Once | INTRAVENOUS | Status: AC | PRN
  Administered 2018-02-16: 100 mL via INTRAVENOUS

## 2018-02-16 MED ORDER — SODIUM CHLORIDE (PF) 0.9 % IJ SOLN
INTRAMUSCULAR | Status: AC
  Filled 2018-02-16: qty 50

## 2018-02-16 MED ORDER — POTASSIUM CHLORIDE 10 MEQ/100ML IV SOLN
10.0000 meq | Freq: Once | INTRAVENOUS | Status: AC
  Administered 2018-02-16: 10 meq via INTRAVENOUS
  Filled 2018-02-16: qty 100

## 2018-02-16 MED ORDER — IOPAMIDOL (ISOVUE-300) INJECTION 61%
INTRAVENOUS | Status: AC
  Filled 2018-02-16: qty 100

## 2018-02-16 MED ORDER — HYDROMORPHONE HCL 1 MG/ML IJ SOLN
1.0000 mg | Freq: Once | INTRAMUSCULAR | Status: AC
  Administered 2018-02-16: 1 mg via INTRAVENOUS
  Filled 2018-02-16: qty 1

## 2018-02-16 MED ORDER — ONDANSETRON 4 MG PO TBDP
4.0000 mg | ORAL_TABLET | Freq: Once | ORAL | Status: AC | PRN
  Administered 2018-02-16: 4 mg via ORAL
  Filled 2018-02-16: qty 1

## 2018-02-16 MED ORDER — DICYCLOMINE HCL 20 MG PO TABS: 20.0000 mg | ORAL_TABLET | Freq: Two times a day (BID) | ORAL | 0 refills | Status: DC

## 2018-02-16 MED ORDER — SODIUM CHLORIDE 0.9% FLUSH
3.0000 mL | Freq: Once | INTRAVENOUS | Status: AC
  Administered 2018-02-16: 3 mL via INTRAVENOUS

## 2018-02-16 NOTE — ED Notes (Signed)
Patient transported to CT 

## 2018-02-16 NOTE — ED Triage Notes (Signed)
Reports generalized abdominal pain with vomiting which began last night.  C/o diarrhea.

## 2018-02-16 NOTE — Discharge Instructions (Addendum)
Evaluated today for abdominal pain and emesis. Your CT scan as well as your other lab work was unremarkable.  Symptoms resolved in the emergency department.  Please follow up with PCP in the next 2 days for reevaluation.  Return to the ED for any worsening symptoms.

## 2018-02-16 NOTE — ED Notes (Signed)
Bed: XT05 Expected date:  Expected time:  Means of arrival:  Comments: Aileen Fass

## 2018-02-16 NOTE — ED Provider Notes (Signed)
[facility] DEPT Provider Note   CSN: [id number] Arrival date & time: 02/16/18  1121   History   Chief Complaint Chief Complaint  Patient presents with  . Abdominal Pain    HPI Tracey Daniel is a 38 y.o. female with past medical history significant for chronic abdominal pain, constipation, gastroparesis, gastric outlet obstruction, recurrent nausea vomiting, polysubstance abuse, peptic ulcer disease, who presents for evaluation of abdominal pain nausea vomiting.  Symptom onset 24 hours ago.  Patient states she has had multiple episodes of nonbloody emesis.  Has also had diarrhea which is been "dark in color."  Denies bright red blood per rectum.  Patient rates her abdominal pain a 10/10.  Pain does not radiate.  Pain is generalized in nature.  Denies aggravating or alleviating factors.  Patient states she was able to eat dinner last night, however is not been able to tolerate p.o. intake today.  Has not taken anything for symptoms.  Symptoms are constant in nature. States her symptoms also are in her lower chest. Describes her chest pain as throbbing in nature. Non pleuritic. Chest pain began after several episodes of emesis. Denies fever, chills, headache, cough, SOB, dysuria, vaginal bleeding, pelvic pain. Admits to previous marijuana use however is unable to tell me the last time she used.  History obtained from patient and significant other. No interpretor was used.  HPI  Past Medical History:  Diagnosis Date  . Anemia of other chronic disease 11/29/2012  . Asthma   . Back pain, chronic   . Chronic abdominal pain   . Constipation   . COPD (chronic obstructive pulmonary disease) ([place])   . Cyst of skin    mid spine area  . Depression 2000   h/o suicidal ideation  . Gastric outlet obstruction   . Gastritis   . Gastroparesis   . Migraine   . Migraines   . Nausea and vomiting    recurrent  . Nicotine addiction   . Osteoporosis   . Peptic  ulcer disease   . Pyloric spasm 03/30/2011  . Seasonal allergies   . Sinusitis   . Substance abuse ([place]) 2008   marijuana  . Vitamin D deficiency     Patient Active Problem List   Diagnosis Date Noted  . Dermatitis 02/01/2018  . Hemorrhoids 02/22/2017  . Nicotine dependence, cigarettes, with other nicotine-induced disorders 12/12/2016  . Thoracic spine pain 11/23/2016  . Nasal obstruction 11/23/2016  . Hyperlipemia 10/02/2015  . Premature ovarian failure 02/04/2015  . Pregnancy as incidental finding, low positive preg test, considered pituitary production of HCG 02/04/2015  . Abdominal pain, epigastric 08/27/2014  . Vitamin D deficiency 05/16/2014  . Alopecia 08/01/2013  . Asthma, cough variant 12/01/2012  . 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 Marijuana use 11/14/2012  . Seasonal allergies 11/14/2012  . Nausea without vomiting 11/09/2012  . Constipation 12/02/2011  . Gastroparesis 05/01/2011  . Pyloric spasm 03/30/2011  . Hip pain, right 03/02/2011  . Anemia 01/30/2011  . Gastric outlet obstruction 01/01/2011  . GERD (gastroesophageal reflux disease) 08/21/2010  . Headache 03/13/2008  . AMENORRHEA, SECONDARY 09/27/2007  . ECZEMA 09/27/2007  . Depression 07/01/2007  . Back pain with radiation 07/01/2007  . CLOSED FRACTURE OF METATARSAL BONE 01/27/2007    Past Surgical History:  Procedure Laterality Date  . BALLOON DILATION N/A 02/09/2013   Procedure: BALLOON DILATION;  Surgeon: Missy Sabins, MD;  Location: [facility];  Service: Endoscopy;  Laterality: N/A;  .  BOTOX INJECTION N/A 02/09/2013   Procedure: BOTOX INJECTION;  Surgeon: Missy Sabins, MD;  Location: [facility];  Service: Endoscopy;  Laterality: N/A;  possible balloon  . BOTOX INJECTION N/A 10/24/2015   Procedure: BOTOX INJECTION;  Surgeon: Daneil Dolin, MD;  Location: [facility];  Service: Endoscopy;  Laterality: N/A;  . CARPAL TUNNEL RELEASE     left hand  . CHOLECYSTECTOMY      ?2002  . COLONOSCOPY WITH PROPOFOL N/A 09/20/2014   RMR: Normal ileo-colonoscopy  . ESOPHAGEAL DILATION  12/25/2010   Procedure: ESOPHAGEAL DILATION;  Surgeon: Dorothyann Peng, MD;  Location: [facility];  Service: Endoscopy;;  . ESOPHAGOGASTRODUODENOSCOPY  12/25/2010   Dorothyann Peng, MD;  moderate gastritis, ?goo secondary to pylorspasm. BX showed reactive gstropathy no h.pyori, SB mucosa with intramucosal lymphocytosis and partial villous blunting (TTG 4.0 normal)  . ESOPHAGOGASTRODUODENOSCOPY N/A 11/14/2012   E[facility] hiatal hernia. Abnormal gastric mucosa of  uncertain significance-status post biopsy. Subjectively, patient may have recurrent symptomatic, pylorospasm.  . ESOPHAGOGASTRODUODENOSCOPY (EGD) WITH PROPOFOL N/A 02/09/2013   elongated stomach, partial lower esophageal ring widely patent. No obvious pyloric stenosis s/p Botox  . ESOPHAGOGASTRODUODENOSCOPY (EGD) WITH PROPOFOL N/A 10/24/2015   Procedure: ESOPHAGOGASTRODUODENOSCOPY (EGD) WITH PROPOFOL;  Surgeon: Daneil Dolin, MD;  Location: [facility];  Service: Endoscopy;  Laterality: N/A;  830   . laser surgery on cervix    . NASAL SEPTUM SURGERY  01/29/2017  . TOE SURGERY       OB History    Gravida  0   Para      Term      Preterm      AB      Living        SAB      TAB      Ectopic      Multiple      Live Births               Home Medications    Prior to Admission medications   Medication Sig Start Date End Date Taking? Authorizing Provider  azelastine (ASTELIN) 0.1 % nasal spray USE 2 SPRAYS IN EACH NOSTRIL TWICE DAILY AS DIRECTED Patient taking differently: Place 2 sprays into both nostrils 2 (two) times daily.  09/13/17  Yes [name], MD  BIOTIN PO Take 1 tablet by mouth daily.   Yes [provider]  calcium-vitamin D (OSCAL WITH D) 500-200 MG-UNIT per tablet Take 1 tablet by mouth 2 (two) times daily.   Yes [provider]  chlorhexidine (PERIDEX) 0.12 %  solution 5 mLs by Mouth Rinse route daily. 05/22/16  Yes [provider]  clobetasol cream (TEMOVATE) 0.26 % Apply 1 application topically 2 (two) times daily as needed (rash). Apply to rash   Yes [provider]  diclofenac (VOLTAREN) 75 MG EC tablet TAKE 1 TABLET(75 MG) BY MOUTH TWICE DAILY Patient taking differently: Take 75 mg by mouth 2 (two) times daily.  02/08/18  Yes Mcarthur Rossetti, MD  estradiol (ESTRACE) 1 MG tablet TAKE 1 TABLET(1 MG) BY MOUTH DAILY Patient taking differently: Take 1 mg by mouth daily.  09/29/17  Yes Jonnie Kind, MD  EVENING PRIMROSE OIL PO Take by mouth 2 (two) times daily.   Yes [provider]  feeding supplement (ENSURE CLINICAL STRENGTH) LIQD Take 237 mLs by mouth 3 (three) times daily with meals. 01/01/11  Yes Dhungel, Nishant, MD  fluticasone (FLONASE) 50 MCG/ACT nasal spray SHAKE  LIQUID AND USE 2 SPRAYS IN EACH NOSTRIL DAILY Patient taking differently: Place 2 sprays into both nostrils daily.  11/18/17  Yes [name], MD  gabapentin (NEURONTIN) 100 MG capsule Take 2 capsules (200 mg total) by mouth 3 (three) times daily. 02/08/18  Yes Pete Pelt, PA-C  LINZESS 290 MCG CAPS capsule TAKE 1 CAPSULE(290 MCG) BY MOUTH DAILY Patient taking differently: Take 290 mcg by mouth daily.  08/17/17  Yes Annitta Needs, NP  loratadine (CLARITIN) 10 MG tablet Take 1 tablet (10 mg total) by mouth daily. 10/09/11  Yes [name], MD  Polyethyl Glycol-Propyl Glycol (SYSTANE OP) Place 2 drops into the left eye 3 (three) times daily.   Yes [provider]  potassium chloride SA (K-DUR,KLOR-CON) 20 MEQ tablet One tablet once daily by mouth 08/02/17  Yes [name], MD  pravastatin (PRAVACHOL) 20 MG tablet Take 1 tablet (20 mg total) by mouth daily. 08/02/17  Yes [name], MD  PREMARIN vaginal cream INSERT 3.71 APPLICATORFUL VAGINALLY THREE TIMES WEEKLY AS DIRECTED 12/15/16  Yes Jonnie Kind, MD    progesterone (PROMETRIUM) 200 MG capsule TAKE 1 CAPSULE BY MOUTH EVERY DAY FOR 2 WEEKS EVERY THIRD MONTH: Quentin Mulling AND OCTOBER 10/03/17  Yes Jonnie Kind, MD  propranolol (INDERAL) 10 MG tablet Take 1 tablet (10 mg total) by mouth 3 (three) times daily. 02/18/17  Yes [name], MD  risperiDONE (RISPERDAL) 0.5 MG tablet Take 0.5 mg at bedtime by mouth.   Yes [provider]  sertraline (ZOLOFT) 50 MG tablet Take 50 mg daily by mouth.   Yes [provider]  sucralfate (CARAFATE) 1 g tablet TAKE 1 TABLET BY MOUTH EVERY MORNING AND AT BEDTIME AS NEEDED FOR STOMACH BURNING( MAY CRUSH 1 TABLET AND MIX IN APPLESAUCE) Patient taking differently: Take 1 g by mouth 2 (two) times daily as needed (Stomach burning (may crush one tablet and mix in applesauce.)).  01/31/18  Yes Annitta Needs, NP  SYMBICORT 80-4.5 MCG/ACT inhaler INHALE 2 PUFFS INTO THE LUNGS TWICE DAILY 11/18/17  Yes [name], MD  tiZANidine (ZANAFLEX) 4 MG tablet TAKE 1 TABLET BY MOUTH TWICE DAILY FOR BACK OR SPASM Patient taking differently: Take 4 mg by mouth 2 (two) times daily as needed (back or spasms). TAKE 1 TABLET BY MOUTH TWICE DAILY FOR BACK OR SPASM 12/15/17  Yes [name], MD  VENTOLIN HFA 108 (90 Base) MCG/ACT inhaler INHALE 2 PUFFS INTO THE LUNGS EVERY 6 HOURS AS NEEDED FOR SHORTNESS OF BREATH Patient taking differently: Inhale 2 puffs into the lungs every 6 (six) hours as needed (shortness of breath.).  09/13/17  Yes [name], MD  zonisamide (ZONEGRAN) 50 MG capsule Take 150 mg by mouth daily. 02/21/17  Yes [provider]  dicyclomine (BENTYL) 20 MG tablet Take 1 tablet (20 mg total) by mouth 2 (two) times daily. 02/16/18   Waleska Buttery A, PA-C  ondansetron (ZOFRAN ODT) 4 MG disintegrating tablet Take 1 tablet (4 mg total) by mouth every 8 (eight) hours as needed for nausea. 02/16/18   Secundino Ellithorpe A, PA-C  pravastatin (PRAVACHOL) 20 MG tablet TAKE  1 TABLET(20 MG) BY MOUTH DAILY 08/05/17   [name], MD    Family History Family History  Problem Relation Age of Onset  . Diabetes Father   . Liver disease Father        liver transplant at [facility], age 55  .  Lung cancer Mother 15  . Heart disease Maternal Grandmother   . Parkinson's disease Maternal Grandfather   . Multiple sclerosis Sister 45  . Depression Sister 52  . Alcohol abuse Sister   . Bipolar disorder Sister   . Schizophrenia Sister   . Colon cancer Neg Hx     Social History Social History   Tobacco Use  . Smoking status: Current Every Day Smoker    Packs/day: 0.10    Years: 15.00    Pack years: 1.50    Types: Cigarettes  . Smokeless tobacco: Never Used  . Tobacco comment: 2 per day  Substance Use Topics  . Alcohol use: No    Alcohol/week: 0.0 standard drinks  . Drug use: Yes    Types: Marijuana     Allergies   Benadryl [diphenhydramine]; Latex; and Penicillins   Review of Systems Review of Systems  Constitutional: Negative.   HENT: Negative.   Respiratory: Negative.   Cardiovascular: Positive for chest pain. Negative for palpitations and leg swelling.  Gastrointestinal: Positive for abdominal pain, diarrhea, nausea and vomiting. Negative for abdominal distention, anal bleeding, blood in stool, constipation and rectal pain.  Genitourinary: Negative.   Musculoskeletal: Negative.   Skin: Negative.   Neurological: Negative.   All other systems reviewed and are negative.    Physical Exam Updated Vital Signs BP 117/78   Pulse 62   Temp 98.7 F (37.1 C) (Oral)   Resp 11   Ht 5\' 6"  (1.676 m)   Wt 68.5 kg   SpO2 100%   BMI 24.37 kg/m   Physical Exam Vitals signs and nursing note reviewed.  Constitutional:      General: She is not in acute distress.    Appearance: She is well-developed. She is not ill-appearing, toxic-appearing or diaphoretic.     Comments: Patient moving around in bed on initial examination and moaning.  HENT:      Head: Normocephalic and atraumatic.     Mouth/Throat:     Comments: Mucous membranes moist. Posterior oropharynx clear. No oral lesions. Eyes:     Extraocular Movements: Extraocular movements intact.     Pupils: Pupils are equal, round, and reactive to light.  Neck:     Musculoskeletal: Normal range of motion.  Cardiovascular:     Rate and Rhythm: Normal rate.     Heart sounds: Normal heart sounds. No murmur. No gallop.      Comments: No chest wall tenderness palpation. Pulmonary:     Effort: Pulmonary effort is normal. No respiratory distress.     Breath sounds: Normal breath sounds.     Comments: Clear to auscultation bilaterally.  No wheeze, rhonchi or rales.  Able to speak in full sentences without difficulty. Abdominal:     General: There is no distension.     Palpations: Abdomen is soft.     Tenderness: There is generalized abdominal tenderness. There is no right CVA tenderness, left CVA tenderness or rebound. Negative signs include Murphy's sign and Rovsing's sign.     Comments: Soft, normoactive bowel signs. Tenderness to palpation in all 4 quadrants. No rebound or guarding. No CVA tenderness.  Musculoskeletal: Normal range of motion.  Skin:    General: Skin is warm and dry.  Neurological:     Mental Status: She is alert.      ED Treatments / Results  Labs (all labs ordered are listed, but only abnormal results are displayed) Labs Reviewed  COMPREHENSIVE METABOLIC PANEL - Abnormal; Notable for the following  components:      Result Value   Potassium 2.9 (*)    CO2 21 (*)    Glucose, Bld 155 (*)    BUN 24 (*)    Total Protein 9.0 (*)    Albumin 5.5 (*)    AST 47 (*)    Anion gap 16 (*)    All other components within normal limits  CBC - Abnormal; Notable for the following components:   WBC 25.5 (*)    All other components within normal limits  URINALYSIS, ROUTINE W REFLEX MICROSCOPIC - Abnormal; Notable for the following components:   Color, Urine AMBER (*)     APPearance HAZY (*)    Specific Gravity, Urine 1.031 (*)    Glucose, UA 50 (*)    Hgb urine dipstick MODERATE (*)    Ketones, ur 20 (*)    Protein, ur >=300 (*)    Bacteria, UA RARE (*)    All other components within normal limits  LIPASE, BLOOD  I-STAT BETA HCG BLOOD, ED ([facility], [facility], [facility] ONLY)  I-STAT TROPONIN, ED  POC OCCULT BLOOD, ED    EKG EKG Interpretation  Date/Time:  Wednesday February 16 2018 13:30:19 EST Ventricular Rate:  56 PR Interval:    QRS Duration: 107 QT Interval:  484 QTC Calculation: 468 R Axis:   88 Text Interpretation:  Sinus rhythm Probable left atrial enlargement Left ventricular hypertrophy Confirmed by Gerlene Fee [phone]) on 02/16/2018 2:50:37 PM   Radiology Dg Chest 2 View  Result Date: 02/16/2018 CLINICAL DATA:  Short of breath EXAM: CHEST - 2 VIEW COMPARISON:  02/08/2018 FINDINGS: The heart size and mediastinal contours are within normal limits. Both lungs are clear. The visualized skeletal structures are unremarkable. IMPRESSION: No active cardiopulmonary disease. Electronically Signed   By: Franchot Gallo M.D.   On: 02/16/2018 16:57   Ct Abdomen Pelvis W Contrast  Result Date: 02/16/2018 CLINICAL DATA:  Acute generalized abdominal pain. EXAM: CT ABDOMEN AND PELVIS WITH CONTRAST TECHNIQUE: Multidetector CT imaging of the abdomen and pelvis was performed using the standard protocol following bolus administration of intravenous contrast. CONTRAST:  117mL ISOVUE-300 IOPAMIDOL (ISOVUE-300) INJECTION 61% COMPARISON:  CT scan of March 20, 2017. FINDINGS: Lower chest: No acute abnormality. Hepatobiliary: No focal liver abnormality is seen. Status post cholecystectomy. No biliary dilatation. Pancreas: Unremarkable. No pancreatic ductal dilatation or surrounding inflammatory changes. Spleen: Normal in size without focal abnormality. Adrenals/Urinary Tract: Adrenal glands are unremarkable. Kidneys are normal, without renal calculi, focal lesion, or hydronephrosis.  Bladder is unremarkable. Stomach/Bowel: Stomach is within normal limits. Appendix appears normal. No evidence of bowel wall thickening, distention, or inflammatory changes. Vascular/Lymphatic: No significant vascular findings are present. No enlarged abdominal or pelvic lymph nodes. Reproductive: Uterus and bilateral adnexa are unremarkable. Other: No abdominal wall hernia or abnormality. No abdominopelvic ascites. Musculoskeletal: No acute or significant osseous findings. IMPRESSION: No abnormality seen in the abdomen or pelvis. Electronically Signed   By: Marijo Conception, M.D.   On: 02/16/2018 15:25    Procedures Procedures (including critical care time)  Medications Ordered in ED Medications  sodium chloride flush (NS) 0.9 % injection 3 mL (3 mLs Intravenous Given 02/16/18 1323)  ondansetron (ZOFRAN-ODT) disintegrating tablet 4 mg (4 mg Oral Given 02/16/18 1131)  sodium chloride 0.9 % bolus 1,000 mL (0 mLs Intravenous Stopped 02/16/18 1437)  HYDROmorphone (DILAUDID) injection 0.5 mg (0.5 mg Intravenous Given 02/16/18 1321)  famotidine (PEPCID) IVPB 20 mg premix (0 mg Intravenous Stopped 02/16/18 1438)  ondansetron (  ZOFRAN) injection 4 mg (4 mg Intravenous Given 02/16/18 1338)  potassium chloride 10 mEq in 100 mL IVPB (0 mEq Intravenous Stopped 02/16/18 1736)  iopamidol (ISOVUE-300) 61 % injection 100 mL (100 mLs Intravenous Contrast Given 02/16/18 1445)  sodium chloride 0.9 % bolus 1,000 mL (0 mLs Intravenous Stopped 02/16/18 1807)  HYDROmorphone (DILAUDID) injection 1 mg (1 mg Intravenous Given 02/16/18 1551)     Initial Impression / Assessment and Plan / ED Course  I have reviewed the triage vital signs and the nursing notes.  Pertinent labs & imaging results that were available during my care of the patient were reviewed by me and considered in my medical decision making (see chart for details).  38 year old female who appears otherwise well presents for evaluation of abdominal pain nausea  vomiting.  Afebrile, nonseptic, non-ill-appearing.  Onset 24 hours ago.  Has not take anything for symptoms PTA.  Generalized abdominal pain.  Abdomen soft with generalized abdominal tenderness to palpation.  No rebound or guarding.  Patient with multiple episodes of nonbloody emesis.  Patient states stools have been "dark in color."  Mucous membranes moist.  Patient is moaning and moving around in bed on initial evaluation.  Will obtain labs, urine, rectal exam, CT scan and reevaluate.  1330: Rectal exam without gross blood. Light brown stool. Patient given fluids, pepcid, antiemetics and pain medication at this time. Hemodynamically stable. Will reevaluate.  1415: CBC with leukocytosis at 25, history of leukocytosis last 19.,  CT negative, troponin negative, Hemoccult negative, Metabolic panel with hypokalemia 2.9 will give IV supplementation, CO2 21, elevated glucose at 155, anion gap 16, lipase 23, urinalysis positive for blood, protein, negative for infection.    Patient is nontoxic, nonseptic appearing, in no apparent distress.  Patient's pain and other symptoms adequately managed in emergency department.  Fluid bolus given.  Labs, imaging and vitals reviewed.  Patient does not meet the SIRS or Sepsis criteria.  On repeat exam patient does not have a surgical abdomin and there are no peritoneal signs.  No indication of appendicitis, bowel obstruction, bowel perforation, cholecystitis, diverticulitis, PID or ectopic pregnancy.  Tolerating p.o. intake in department without difficulty.  Patient discharged home with symptomatic treatment and given strict instructions for follow-up with their primary care physician.  Patient is currently taking Pepcid as well as Carafate at home.  Advised to continue taking his medications.  Will DC home with short course of Zofran. I have also discussed reasons to return immediately to the ER.  Patient expresses understanding and agrees with plan.    Patient discussed  with my attending, Dr. Sedonia Small.  He agrees with above treatment, plan and disposition. Final Clinical Impressions(s) / ED Diagnoses   Final diagnoses:  Generalized abdominal pain  Non-intractable vomiting with nausea, unspecified vomiting type    ED Discharge Orders         Ordered    ondansetron (ZOFRAN ODT) 4 MG disintegrating tablet  Every 8 hours PRN     02/16/18 1739    dicyclomine (BENTYL) 20 MG tablet  2 times daily     02/16/18 1739           Jaeson Molstad A, PA-C 02/16/18 2302    Maudie Flakes, MD 02/17/18 1247

## 2018-02-16 NOTE — ED Notes (Signed)
Bed: [facility] Expected date:  Expected time:  Means of arrival:  Comments: 

## 2018-02-16 NOTE — ED Notes (Signed)
Bed: [facility] Expected date:  Expected time:  Means of arrival:  Comments: Per 25

## 2018-02-21 ENCOUNTER — Other Ambulatory Visit: Payer: Self-pay

## 2018-02-21 ENCOUNTER — Telehealth: Payer: Self-pay | Admitting: Internal Medicine

## 2018-02-21 ENCOUNTER — Emergency Department (HOSPITAL_COMMUNITY)
Admission: EM | Admit: 2018-02-21 | Discharge: 2018-02-22 | Disposition: A | Discharge status: Home / Self Care | Payer: Medicare HMO | Attending: Emergency Medicine | Admitting: Emergency Medicine

## 2018-02-21 ENCOUNTER — Encounter (HOSPITAL_COMMUNITY): Payer: Self-pay | Admitting: Emergency Medicine

## 2018-02-21 DIAGNOSIS — R1013 Epigastric pain: Secondary | ICD-10-CM

## 2018-02-21 DIAGNOSIS — R110 Nausea: Secondary | ICD-10-CM | POA: Insufficient documentation

## 2018-02-21 DIAGNOSIS — R109 Unspecified abdominal pain: Secondary | ICD-10-CM | POA: Diagnosis present

## 2018-02-21 DIAGNOSIS — F17210 Nicotine dependence, cigarettes, uncomplicated: Secondary | ICD-10-CM | POA: Diagnosis not present

## 2018-02-21 DIAGNOSIS — R112 Nausea with vomiting, unspecified: Secondary | ICD-10-CM | POA: Diagnosis not present

## 2018-02-21 DIAGNOSIS — Z79899 Other long term (current) drug therapy: Secondary | ICD-10-CM | POA: Diagnosis not present

## 2018-02-21 DIAGNOSIS — J449 Chronic obstructive pulmonary disease, unspecified: Secondary | ICD-10-CM | POA: Diagnosis not present

## 2018-02-21 LAB — LIPASE, BLOOD: Lipase: 32 U/L (ref 11–51)

## 2018-02-21 LAB — COMPREHENSIVE METABOLIC PANEL
ALK PHOS: 47 U/L (ref 38–126)
ALT: 24 U/L (ref 0–44)
AST: 24 U/L (ref 15–41)
Albumin: 4 g/dL (ref 3.5–5.0)
Anion gap: 5 (ref 5–15)
BUN: 20 mg/dL (ref 6–20)
CO2: 25 mmol/L (ref 22–32)
Calcium: 9.2 mg/dL (ref 8.9–10.3)
Chloride: 110 mmol/L (ref 98–111)
Creatinine, Ser: 0.9 mg/dL (ref 0.44–1.00)
GFR calc Af Amer: >60 mL/min (ref >60)
GFR calc non Af Amer: >60 mL/min (ref >60)
Glucose, Bld: 75 mg/dL (ref 70–99)
Potassium: 4.1 mmol/L (ref 3.5–5.1)
Sodium: 140 mmol/L (ref 135–145)
Total Bilirubin: 0.3 mg/dL (ref 0.3–1.2)
Total Protein: 6.8 g/dL (ref 6.5–8.1)

## 2018-02-21 LAB — I-STAT BETA HCG BLOOD, ED (MC, WL, AP ONLY): I-stat hCG, quantitative: <5 m[IU]/mL (ref <5)

## 2018-02-21 LAB — CBC
HCT: 33 % — ABNORMAL LOW (ref 36.0–46.0)
Hemoglobin: 10.5 g/dL — ABNORMAL LOW (ref 12.0–15.0)
MCH: 31 pg (ref 26.0–34.0)
MCHC: 31.8 g/dL (ref 30.0–36.0)
MCV: 97.3 fL (ref 80.0–100.0)
Platelets: 173 10*3/uL (ref 150–400)
RBC: 3.39 MIL/uL — ABNORMAL LOW (ref 3.87–5.11)
RDW: 13.5 % (ref 11.5–15.5)
WBC: 8.9 10*3/uL (ref 4.0–10.5)
nRBC: 0 % (ref 0.0–0.2)

## 2018-02-21 MED ORDER — METOCLOPRAMIDE HCL 5 MG/ML IJ SOLN
10.0000 mg | Freq: Once | INTRAMUSCULAR | Status: AC
  Administered 2018-02-22: 10 mg via INTRAVENOUS
  Filled 2018-02-21: qty 2

## 2018-02-21 MED ORDER — PANTOPRAZOLE SODIUM 40 MG IV SOLR
40.0000 mg | Freq: Once | INTRAVENOUS | Status: AC
  Administered 2018-02-22: 40 mg via INTRAVENOUS
  Filled 2018-02-21: qty 40

## 2018-02-21 MED ORDER — MORPHINE SULFATE (PF) 4 MG/ML IV SOLN
4.0000 mg | Freq: Once | INTRAVENOUS | Status: AC
  Administered 2018-02-22: 4 mg via INTRAVENOUS
  Filled 2018-02-21: qty 1

## 2018-02-21 NOTE — Telephone Encounter (Signed)
Spoke with pt. She is having bad pain radiating to her chest and back. Pt says it hurts when she eats or drinks anything. Pt is taking Carafate and the Linzess daily and feels it doesn't help her. Pt was advised to go to the ED due to the chest pains she is having. Please advise.

## 2018-02-21 NOTE — Telephone Encounter (Signed)
[place] PATIENT, SHE IS HAVING BAD ABD PAIN RADIATING TO HER CHEST

## 2018-02-21 NOTE — Telephone Encounter (Signed)
Tried calling pt x 2. Number is currently busy. Will call pt back.

## 2018-02-21 NOTE — ED Provider Notes (Signed)
[facility] DEPT Provider Note   CSN: [id number] Arrival date & time: 02/21/18  1614     History   Chief Complaint Chief Complaint  Patient presents with  . Abdominal Pain  . Nausea    HPI Tracey Daniel is a 38 y.o. female who presents with abdominal pain and nausea.  Past medical history significant for chronic abdominal pain, history of gastroparesis, anemia, gastritis, peptic ulcer disease, pyloric spasms, marijuana use.  Patient states that she has had abdominal pain, nausea, vomiting for the past several days.  She was seen in the emergency department on January 29 for the same symptoms.  The abdominal pain is in the epigastric area and radiates upwards to the chest.  She states it feels like a gnawing, twisting pain.  She has been taking her medicines as prescribed but states that she can feel the pill getting stuck in her esophagus at times.  She states she felt better after she left here the other day but still had several episodes of vomiting.  She reports having coffee-ground emesis at times.  She denies bloody stools. She denies fever, chills, cough, diarrhea, urinary symptoms.  She had a CT scan of her abdomen which was normal last week.  She does have a GI doctor in [place] but does not have an appointment coming up.  She has had an EGD in 2017 which showed gastritis and a narrow esophagus. She is started on diclofenac by her orthopedic doctor couple of days ago for back pain.  HPI  Past Medical History:  Diagnosis Date  . Anemia of other chronic disease 11/29/2012  . Asthma   . Back pain, chronic   . Chronic abdominal pain   . Constipation   . COPD (chronic obstructive pulmonary disease) ([place])   . Cyst of skin    mid spine area  . Depression 2000   h/o suicidal ideation  . Gastric outlet obstruction   . Gastritis   . Gastroparesis   . Migraine   . Migraines   . Nausea and vomiting    recurrent  . Nicotine addiction   .  Osteoporosis   . Peptic ulcer disease   . Pyloric spasm 03/30/2011  . Seasonal allergies   . Sinusitis   . Substance abuse ([place]) 2008   marijuana  . Vitamin D deficiency     Patient Active Problem List   Diagnosis Date Noted  . Dermatitis 02/01/2018  . Hemorrhoids 02/22/2017  . Nicotine dependence, cigarettes, with other nicotine-induced disorders 12/12/2016  . Thoracic spine pain 11/23/2016  . Nasal obstruction 11/23/2016  . Hyperlipemia 10/02/2015  . Premature ovarian failure 02/04/2015  . Pregnancy as incidental finding, low positive preg test, considered pituitary production of HCG 02/04/2015  . Abdominal pain, epigastric 08/27/2014  . Vitamin D deficiency 05/16/2014  . Alopecia 08/01/2013  . Asthma, cough variant 12/01/2012  . 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 Marijuana use 11/14/2012  . Seasonal allergies 11/14/2012  . Nausea without vomiting 11/09/2012  . Constipation 12/02/2011  . Gastroparesis 05/01/2011  . Pyloric spasm 03/30/2011  . Hip pain, right 03/02/2011  . Anemia 01/30/2011  . Gastric outlet obstruction 01/01/2011  . GERD (gastroesophageal reflux disease) 08/21/2010  . Headache 03/13/2008  . AMENORRHEA, SECONDARY 09/27/2007  . ECZEMA 09/27/2007  . Depression 07/01/2007  . Back pain with radiation 07/01/2007  . CLOSED FRACTURE OF METATARSAL BONE 01/27/2007    Past Surgical History:  Procedure Laterality Date  . BALLOON DILATION  N/A 02/09/2013   Procedure: BALLOON DILATION;  Surgeon: Missy Sabins, MD;  Location: [facility];  Service: Endoscopy;  Laterality: N/A;  . BOTOX INJECTION N/A 02/09/2013   Procedure: BOTOX INJECTION;  Surgeon: Missy Sabins, MD;  Location: [facility];  Service: Endoscopy;  Laterality: N/A;  possible balloon  . BOTOX INJECTION N/A 10/24/2015   Procedure: BOTOX INJECTION;  Surgeon: Daneil Dolin, MD;  Location: [facility];  Service: Endoscopy;  Laterality: N/A;  . CARPAL TUNNEL RELEASE     left  hand  . CHOLECYSTECTOMY     ?2002  . COLONOSCOPY WITH PROPOFOL N/A 09/20/2014   RMR: Normal ileo-colonoscopy  . ESOPHAGEAL DILATION  12/25/2010   Procedure: ESOPHAGEAL DILATION;  Surgeon: Dorothyann Peng, MD;  Location: [facility];  Service: Endoscopy;;  . ESOPHAGOGASTRODUODENOSCOPY  12/25/2010   Dorothyann Peng, MD;  moderate gastritis, ?goo secondary to pylorspasm. BX showed reactive gstropathy no h.pyori, SB mucosa with intramucosal lymphocytosis and partial villous blunting (TTG 4.0 normal)  . ESOPHAGOGASTRODUODENOSCOPY N/A 11/14/2012   [facility]:ENIDP hiatal hernia. Abnormal gastric mucosa of  uncertain significance-status post biopsy. Subjectively, patient may have recurrent symptomatic, pylorospasm.  . ESOPHAGOGASTRODUODENOSCOPY (EGD) WITH PROPOFOL N/A 02/09/2013   elongated stomach, partial lower esophageal ring widely patent. No obvious pyloric stenosis s/p Botox  . ESOPHAGOGASTRODUODENOSCOPY (EGD) WITH PROPOFOL N/A 10/24/2015   Procedure: ESOPHAGOGASTRODUODENOSCOPY (EGD) WITH PROPOFOL;  Surgeon: Daneil Dolin, MD;  Location: [facility];  Service: Endoscopy;  Laterality: N/A;  830   . laser surgery on cervix    . NASAL SEPTUM SURGERY  01/29/2017  . TOE SURGERY       OB History    Gravida  0   Para      Term      Preterm      AB      Living        SAB      TAB      Ectopic      Multiple      Live Births               Home Medications    Prior to Admission medications   Medication Sig Start Date End Date Taking? Authorizing Provider  azelastine (ASTELIN) 0.1 % nasal spray USE 2 SPRAYS IN EACH NOSTRIL TWICE DAILY AS DIRECTED Patient taking differently: Place 2 sprays into both nostrils 2 (two) times daily.  09/13/17   [name], MD  BIOTIN PO Take 1 tablet by mouth daily.    [provider]  calcium-vitamin D (OSCAL WITH D) 500-200 MG-UNIT per tablet Take 1 tablet by mouth 2 (two) times daily.    [provider]  chlorhexidine  (PERIDEX) 0.12 % solution 5 mLs by Mouth Rinse route daily. 05/22/16   [provider]  clobetasol cream (TEMOVATE) 8.24 % Apply 1 application topically 2 (two) times daily as needed (rash). Apply to rash    [provider]  diclofenac (VOLTAREN) 75 MG EC tablet TAKE 1 TABLET(75 MG) BY MOUTH TWICE DAILY Patient taking differently: Take 75 mg by mouth 2 (two) times daily.  02/08/18   Mcarthur Rossetti, MD  dicyclomine (BENTYL) 20 MG tablet Take 1 tablet (20 mg total) by mouth 2 (two) times daily. 02/16/18   Henderly, Britni A, PA-C  estradiol (ESTRACE) 1 MG tablet TAKE 1 TABLET(1 MG) BY MOUTH DAILY Patient taking differently: Take 1 mg by mouth daily.  09/29/17   Jonnie Kind, MD  EVENING PRIMROSE OIL PO Take by mouth 2 (two) times daily.    [provider]  feeding supplement (ENSURE CLINICAL STRENGTH) LIQD Take 237 mLs by mouth 3 (three) times daily with meals. 01/01/11   Dhungel, Nishant, MD  fluticasone (FLONASE) 50 MCG/ACT nasal spray SHAKE LIQUID AND USE 2 SPRAYS IN EACH NOSTRIL DAILY Patient taking differently: Place 2 sprays into both nostrils daily.  11/18/17   [name], MD  gabapentin (NEURONTIN) 100 MG capsule Take 2 capsules (200 mg total) by mouth 3 (three) times daily. 02/08/18   Carlis Abbott, Gillermo Murdoch, PA-C  LINZESS 290 MCG CAPS capsule TAKE 1 CAPSULE(290 MCG) BY MOUTH DAILY Patient taking differently: Take 290 mcg by mouth daily.  08/17/17   Annitta Needs, NP  loratadine (CLARITIN) 10 MG tablet Take 1 tablet (10 mg total) by mouth daily. 10/09/11   [name], MD  ondansetron (ZOFRAN ODT) 4 MG disintegrating tablet Take 1 tablet (4 mg total) by mouth every 8 (eight) hours as needed for nausea. 02/16/18   Henderly, Britni A, PA-C  Polyethyl Glycol-Propyl Glycol (SYSTANE OP) Place 2 drops into the left eye 3 (three) times daily.    [provider]  potassium chloride SA (K-DUR,KLOR-CON) 20 MEQ tablet One tablet once daily by mouth  08/02/17   [name], MD  pravastatin (PRAVACHOL) 20 MG tablet Take 1 tablet (20 mg total) by mouth daily. 08/02/17   [name], MD  pravastatin (PRAVACHOL) 20 MG tablet TAKE 1 TABLET(20 MG) BY MOUTH DAILY 08/05/17   [name], MD  PREMARIN vaginal cream INSERT 2.42 APPLICATORFUL VAGINALLY THREE TIMES WEEKLY AS DIRECTED 12/15/16   Jonnie Kind, MD  progesterone (PROMETRIUM) 200 MG capsule TAKE 1 CAPSULE BY MOUTH EVERY DAY FOR 2 WEEKS EVERY THIRD MONTH: Quentin Mulling AND OCTOBER 10/03/17   Jonnie Kind, MD  propranolol (INDERAL) 10 MG tablet Take 1 tablet (10 mg total) by mouth 3 (three) times daily. 02/18/17   [name], MD  risperiDONE (RISPERDAL) 0.5 MG tablet Take 0.5 mg at bedtime by mouth.    [provider]  sertraline (ZOLOFT) 50 MG tablet Take 50 mg daily by mouth.    [provider]  sucralfate (CARAFATE) 1 g tablet TAKE 1 TABLET BY MOUTH EVERY MORNING AND AT BEDTIME AS NEEDED FOR STOMACH BURNING( MAY CRUSH 1 TABLET AND MIX IN APPLESAUCE) Patient taking differently: Take 1 g by mouth 2 (two) times daily as needed (Stomach burning (may crush one tablet and mix in applesauce.)).  01/31/18   Annitta Needs, NP  SYMBICORT 80-4.5 MCG/ACT inhaler INHALE 2 PUFFS INTO THE LUNGS TWICE DAILY 11/18/17   [name], MD  tiZANidine (ZANAFLEX) 4 MG tablet TAKE 1 TABLET BY MOUTH TWICE DAILY FOR BACK OR SPASM Patient taking differently: Take 4 mg by mouth 2 (two) times daily as needed (back or spasms). TAKE 1 TABLET BY MOUTH TWICE DAILY FOR BACK OR SPASM 12/15/17   [name], MD  VENTOLIN HFA 108 (90 Base) MCG/ACT inhaler INHALE 2 PUFFS INTO THE LUNGS EVERY 6 HOURS AS NEEDED FOR SHORTNESS OF BREATH Patient taking differently: Inhale 2 puffs into the lungs every 6 (six) hours as needed (shortness of breath.).  09/13/17   [name], MD  zonisamide (ZONEGRAN) 50 MG capsule Take 150 mg by mouth daily. 02/21/17   [provider]    Family History Family History  Problem Relation Age of Onset  .  Diabetes Father   . Liver disease Father        liver transplant at [facility], age 10  . Lung cancer Mother 71  . Heart disease Maternal Grandmother   . Parkinson's disease Maternal Grandfather   . Multiple sclerosis Sister 67  . Depression Sister 2  . Alcohol abuse Sister   . Bipolar disorder Sister   . Schizophrenia Sister   . Colon cancer Neg Hx     Social History Social History   Tobacco Use  . Smoking status: Current Every Day Smoker    Packs/day: 0.10    Years: 15.00    Pack years: 1.50    Types: Cigarettes  . Smokeless tobacco: Never Used  . Tobacco comment: 2 per day  Substance Use Topics  . Alcohol use: No    Alcohol/week: 0.0 standard drinks  . Drug use: Yes    Types: Marijuana     Allergies   Benadryl [diphenhydramine]; Latex; and Penicillins   Review of Systems Review of Systems  Constitutional: Negative for fever.  Respiratory: Negative for shortness of breath.   Cardiovascular: Negative for chest pain.  Gastrointestinal: Positive for abdominal pain, constipation, nausea and vomiting. Negative for diarrhea.  Genitourinary: Negative for difficulty urinating and dysuria.  All other systems reviewed and are negative.    Physical Exam Updated Vital Signs BP 135/82 (BP Location: Right Arm)   Pulse 63   Temp 98.4 F (36.9 C) (Oral)   Resp 16   Ht 5\' 6"  (1.676 m)   Wt 68 kg   SpO2 100%   BMI 24.21 kg/m   Physical Exam Vitals signs and nursing note reviewed.  Constitutional:      General: She is not in acute distress.    Appearance: She is well-developed.     Comments: Cooperative. Uncomfortable appearing  HENT:     Head: Normocephalic and atraumatic.  Eyes:     General: No scleral icterus.       Right eye: No discharge.        Left eye: No discharge.     Conjunctiva/sclera: Conjunctivae normal.     Pupils: Pupils are equal, round, and reactive to light.    Neck:     Musculoskeletal: Normal range of motion.  Cardiovascular:     Rate and Rhythm: Normal rate.  Pulmonary:     Effort: Pulmonary effort is normal. No respiratory distress.  Abdominal:     General: Abdomen is protuberant. Bowel sounds are normal. There is no distension.     Palpations: Abdomen is soft.     Tenderness: There is generalized abdominal tenderness (generalized but worse in epigastric).     Hernia: No hernia is present.     Comments: Prior surgical scars  Skin:    General: Skin is warm and dry.  Neurological:     Mental Status: She is alert and oriented to person, place, and time.  Psychiatric:        Behavior: Behavior normal.      ED Treatments / Results  Labs (all labs ordered are listed, but only abnormal results are displayed) Labs Reviewed  CBC - Abnormal; Notable for the following components:      Result Value   RBC 3.39 (*)    Hemoglobin 10.5 (*)    HCT 33.0 (*)    All other components within normal limits  LIPASE, BLOOD  COMPREHENSIVE METABOLIC PANEL  URINALYSIS, ROUTINE W REFLEX MICROSCOPIC  I-STAT BETA HCG BLOOD, ED ([facility], [facility], [facility] ONLY)  EKG None  Radiology Dg Abd Acute W/chest  Result Date: 02/22/2018 CLINICAL DATA:  Mid abdominal pain for 1 week, worsening tonight. EXAM: DG ABDOMEN ACUTE W/ 1V CHEST COMPARISON:  Chest radiograph and CT abdomen and pelvis February 16, 2018 FINDINGS: Cardiomediastinal silhouette is normal. Lungs are clear, no pleural effusions. No pneumothorax. Soft tissue planes and included osseous structures are unremarkable. Bowel gas pattern is nondilated and nonobstructive. Small volume retained large bowel stool. Surgical clips in the included right abdomen compatible with cholecystectomy. No intra-abdominal mass effect, pathologic calcifications or free air. Soft tissue planes and included osseous structures are non-suspicious. Mild levoscoliosis may be positional. IMPRESSION: 1. Normal chest radiograph. 2. Normal bowel  gas pattern. Electronically Signed   By: Elon Alas M.D.   On: 02/22/2018 01:26    Procedures Procedures (including critical care time)  Medications Ordered in ED Medications  pantoprazole (PROTONIX) injection 40 mg (40 mg Intravenous Given 02/22/18 0004)  morphine 4 MG/ML injection 4 mg (4 mg Intravenous Given 02/22/18 0007)  metoCLOPramide (REGLAN) injection 10 mg (10 mg Intravenous Given 02/22/18 0006)  morphine 4 MG/ML injection 4 mg (4 mg Intravenous Given 02/22/18 0240)  haloperidol lactate (HALDOL) injection 2 mg (2 mg Intravenous Given 02/22/18 0239)     Initial Impression / Assessment and Plan / ED Course  I have reviewed the triage vital signs and the nursing notes.  Pertinent labs & imaging results that were available during my care of the patient were reviewed by me and considered in my medical decision making (see chart for details).  38 year old female presents with epigastric pain, nausea and vomiting.  Her vital signs are normal here.  On exam abdomen is soft and tender in the epigastric area.  It feels like her prior flares of her chronic abdominal pain.  Blood work overall is improved from a couple of days ago.  Her white count is now normal.  Her CMP and lipase are normal.  Her urine is unremarkable.  She does have a drop in her hemoglobin.  Suspect possible peptic ulcer disease.  Will provide pain control and give Protonix and Reglan.  Will obtain acute chest and abdomen to rule out obstruction or perforation  Acute chest and abdomen is negative.  She tolerated some sips of water here.  Pain is currently a 5 out of 10.  We will give her prescription for pain medicine, Protonix, Reglan and encouraged follow-up with her GI doctor.  Final Clinical Impressions(s) / ED Diagnoses   Final diagnoses:  Epigastric abdominal pain  Non-intractable vomiting with nausea, unspecified vomiting type    ED Discharge Orders    None       Recardo Evangelist, PA-C 02/22/18 [id number]      Davonna Belling, MD 02/22/18 [phone]

## 2018-02-21 NOTE — ED Triage Notes (Signed)
Pt c/o abd with nausea since last Wed when was seen here for same symptoms. Reports pains havent gotten any better.

## 2018-02-22 ENCOUNTER — Encounter: Payer: Self-pay | Admitting: Internal Medicine

## 2018-02-22 ENCOUNTER — Emergency Department (HOSPITAL_COMMUNITY): Payer: Medicare HMO

## 2018-02-22 ENCOUNTER — Ambulatory Visit: Payer: Medicare HMO | Attending: Physician Assistant | Admitting: Physical Therapy

## 2018-02-22 DIAGNOSIS — R1013 Epigastric pain: Secondary | ICD-10-CM | POA: Diagnosis not present

## 2018-02-22 DIAGNOSIS — R109 Unspecified abdominal pain: Secondary | ICD-10-CM | POA: Diagnosis not present

## 2018-02-22 LAB — URINALYSIS, ROUTINE W REFLEX MICROSCOPIC
Bilirubin Urine: NEGATIVE
Glucose, UA: NEGATIVE mg/dL
Hgb urine dipstick: NEGATIVE
Ketones, ur: NEGATIVE mg/dL
LEUKOCYTES UA: NEGATIVE
Nitrite: NEGATIVE
Protein, ur: NEGATIVE mg/dL
Specific Gravity, Urine: 1.013 (ref 1.005–1.030)
pH: 7 (ref 5.0–8.0)

## 2018-02-22 MED ORDER — HYDROCODONE-ACETAMINOPHEN 5-325 MG PO TABS: 1.0000 | ORAL_TABLET | ORAL | 0 refills | Status: DC | PRN

## 2018-02-22 MED ORDER — PANTOPRAZOLE SODIUM 40 MG PO TBEC: 40.0000 mg | DELAYED_RELEASE_TABLET | Freq: Every day | ORAL | 0 refills | Status: DC

## 2018-02-22 MED ORDER — MORPHINE SULFATE (PF) 4 MG/ML IV SOLN
4.0000 mg | Freq: Once | INTRAVENOUS | Status: AC
  Administered 2018-02-22: 4 mg via INTRAVENOUS
  Filled 2018-02-22: qty 1

## 2018-02-22 MED ORDER — METOCLOPRAMIDE HCL 10 MG PO TABS: 10.0000 mg | ORAL_TABLET | Freq: Four times a day (QID) | ORAL | 0 refills | Status: DC

## 2018-02-22 MED ORDER — HALOPERIDOL LACTATE 5 MG/ML IJ SOLN
2.0000 mg | Freq: Once | INTRAMUSCULAR | Status: AC
  Administered 2018-02-22: 2 mg via INTRAVENOUS
  Filled 2018-02-22: qty 1

## 2018-02-22 NOTE — Discharge Instructions (Signed)
Take Protonix for stomach pain - this is an acid reducer medicine Continue Carafate Take Norco for severe pain Take Reglan for nausea Please follow up with GI

## 2018-02-22 NOTE — Telephone Encounter (Signed)
Spoke with pt. She is taking the medication given at the ED. Offered apt for this week. Pt isn't able to come please mail pt an appointment card for next available.

## 2018-02-22 NOTE — Telephone Encounter (Signed)
SCHEDULED AND LETTER SENT  °

## 2018-02-22 NOTE — Telephone Encounter (Signed)
Agree with ER to rule out cardiac etiology. Reviewed ER note and no cardiac or acute findings. Given PPI and pain medication and encouraged follow-up with GI.  Please reach out to schedule a follow-up visit.

## 2018-02-27 ENCOUNTER — Encounter: Payer: Self-pay | Admitting: Family Medicine

## 2018-02-27 NOTE — Assessment & Plan Note (Signed)
Managed by GI and controlled 

## 2018-03-07 ENCOUNTER — Encounter (HOSPITAL_COMMUNITY): Payer: Self-pay | Admitting: Family Medicine

## 2018-03-07 ENCOUNTER — Encounter: Payer: Self-pay | Admitting: Gastroenterology

## 2018-03-07 ENCOUNTER — Emergency Department (HOSPITAL_COMMUNITY): Payer: Medicare HMO

## 2018-03-07 ENCOUNTER — Ambulatory Visit (INDEPENDENT_AMBULATORY_CARE_PROVIDER_SITE_OTHER): Payer: Medicare HMO | Admitting: Gastroenterology

## 2018-03-07 ENCOUNTER — Observation Stay (HOSPITAL_COMMUNITY)
Admission: EM | Admit: 2018-03-07 | Discharge: 2018-03-11 | Disposition: A | Discharge status: Home / Self Care | Payer: Medicare HMO | Attending: Family Medicine | Admitting: Family Medicine

## 2018-03-07 ENCOUNTER — Other Ambulatory Visit: Payer: Self-pay

## 2018-03-07 VITALS — BP 119/83 | HR 68 | Temp 98.0°F | Ht 66.0 in | Wt 149.0 lb

## 2018-03-07 DIAGNOSIS — M810 Age-related osteoporosis without current pathological fracture: Secondary | ICD-10-CM | POA: Diagnosis not present

## 2018-03-07 DIAGNOSIS — Z791 Long term (current) use of non-steroidal anti-inflammatories (NSAID): Secondary | ICD-10-CM | POA: Diagnosis not present

## 2018-03-07 DIAGNOSIS — R1116 Cannabis hyperemesis syndrome: Secondary | ICD-10-CM | POA: Diagnosis present

## 2018-03-07 DIAGNOSIS — Z818 Family history of other mental and behavioral disorders: Secondary | ICD-10-CM | POA: Insufficient documentation

## 2018-03-07 DIAGNOSIS — K449 Diaphragmatic hernia without obstruction or gangrene: Secondary | ICD-10-CM | POA: Diagnosis not present

## 2018-03-07 DIAGNOSIS — J449 Chronic obstructive pulmonary disease, unspecified: Secondary | ICD-10-CM | POA: Diagnosis not present

## 2018-03-07 DIAGNOSIS — R1084 Generalized abdominal pain: Secondary | ICD-10-CM | POA: Diagnosis present

## 2018-03-07 DIAGNOSIS — E559 Vitamin D deficiency, unspecified: Secondary | ICD-10-CM | POA: Diagnosis not present

## 2018-03-07 DIAGNOSIS — Z8379 Family history of other diseases of the digestive system: Secondary | ICD-10-CM | POA: Insufficient documentation

## 2018-03-07 DIAGNOSIS — R109 Unspecified abdominal pain: Secondary | ICD-10-CM | POA: Diagnosis not present

## 2018-03-07 DIAGNOSIS — K313 Pylorospasm, not elsewhere classified: Secondary | ICD-10-CM | POA: Diagnosis not present

## 2018-03-07 DIAGNOSIS — E785 Hyperlipidemia, unspecified: Secondary | ICD-10-CM | POA: Insufficient documentation

## 2018-03-07 DIAGNOSIS — Z888 Allergy status to other drugs, medicaments and biological substances status: Secondary | ICD-10-CM | POA: Diagnosis not present

## 2018-03-07 DIAGNOSIS — F1290 Cannabis use, unspecified, uncomplicated: Secondary | ICD-10-CM | POA: Diagnosis not present

## 2018-03-07 DIAGNOSIS — Z8249 Family history of ischemic heart disease and other diseases of the circulatory system: Secondary | ICD-10-CM | POA: Insufficient documentation

## 2018-03-07 DIAGNOSIS — Z833 Family history of diabetes mellitus: Secondary | ICD-10-CM | POA: Diagnosis not present

## 2018-03-07 DIAGNOSIS — K3184 Gastroparesis: Secondary | ICD-10-CM | POA: Diagnosis not present

## 2018-03-07 DIAGNOSIS — R1013 Epigastric pain: Secondary | ICD-10-CM | POA: Diagnosis not present

## 2018-03-07 DIAGNOSIS — K279 Peptic ulcer, site unspecified, unspecified as acute or chronic, without hemorrhage or perforation: Secondary | ICD-10-CM | POA: Diagnosis not present

## 2018-03-07 DIAGNOSIS — Z820 Family history of epilepsy and other diseases of the nervous system: Secondary | ICD-10-CM | POA: Insufficient documentation

## 2018-03-07 DIAGNOSIS — F329 Major depressive disorder, single episode, unspecified: Secondary | ICD-10-CM

## 2018-03-07 DIAGNOSIS — R112 Nausea with vomiting, unspecified: Secondary | ICD-10-CM | POA: Diagnosis not present

## 2018-03-07 DIAGNOSIS — Z79899 Other long term (current) drug therapy: Secondary | ICD-10-CM | POA: Insufficient documentation

## 2018-03-07 DIAGNOSIS — G8929 Other chronic pain: Secondary | ICD-10-CM | POA: Diagnosis not present

## 2018-03-07 DIAGNOSIS — Z9049 Acquired absence of other specified parts of digestive tract: Secondary | ICD-10-CM | POA: Diagnosis not present

## 2018-03-07 DIAGNOSIS — Z880 Allergy status to penicillin: Secondary | ICD-10-CM | POA: Insufficient documentation

## 2018-03-07 DIAGNOSIS — G43909 Migraine, unspecified, not intractable, without status migrainosus: Secondary | ICD-10-CM | POA: Diagnosis not present

## 2018-03-07 DIAGNOSIS — J45991 Cough variant asthma: Secondary | ICD-10-CM | POA: Diagnosis present

## 2018-03-07 DIAGNOSIS — K219 Gastro-esophageal reflux disease without esophagitis: Secondary | ICD-10-CM | POA: Insufficient documentation

## 2018-03-07 DIAGNOSIS — F17210 Nicotine dependence, cigarettes, uncomplicated: Secondary | ICD-10-CM | POA: Insufficient documentation

## 2018-03-07 DIAGNOSIS — D649 Anemia, unspecified: Secondary | ICD-10-CM | POA: Diagnosis not present

## 2018-03-07 DIAGNOSIS — F12988 Cannabis use, unspecified with other cannabis-induced disorder: Secondary | ICD-10-CM

## 2018-03-07 DIAGNOSIS — K311 Adult hypertrophic pyloric stenosis: Secondary | ICD-10-CM | POA: Diagnosis present

## 2018-03-07 DIAGNOSIS — K5900 Constipation, unspecified: Secondary | ICD-10-CM | POA: Diagnosis present

## 2018-03-07 DIAGNOSIS — Z811 Family history of alcohol abuse and dependence: Secondary | ICD-10-CM | POA: Insufficient documentation

## 2018-03-07 DIAGNOSIS — F32A Depression, unspecified: Secondary | ICD-10-CM | POA: Diagnosis present

## 2018-03-07 DIAGNOSIS — R079 Chest pain, unspecified: Secondary | ICD-10-CM | POA: Diagnosis not present

## 2018-03-07 LAB — COMPREHENSIVE METABOLIC PANEL
ALT: 36 U/L (ref 0–44)
AST: 28 U/L (ref 15–41)
Albumin: 4.7 g/dL (ref 3.5–5.0)
Alkaline Phosphatase: 60 U/L (ref 38–126)
Anion gap: 7 (ref 5–15)
BUN: 21 mg/dL — ABNORMAL HIGH (ref 6–20)
CO2: 25 mmol/L (ref 22–32)
Calcium: 9.7 mg/dL (ref 8.9–10.3)
Chloride: 108 mmol/L (ref 98–111)
Creatinine, Ser: 0.85 mg/dL (ref 0.44–1.00)
GFR calc Af Amer: >60 mL/min (ref >60)
GFR calc non Af Amer: >60 mL/min (ref >60)
GLUCOSE: 85 mg/dL (ref 70–99)
Potassium: 3.8 mmol/L (ref 3.5–5.1)
Sodium: 140 mmol/L (ref 135–145)
Total Bilirubin: 0.1 mg/dL — ABNORMAL LOW (ref 0.3–1.2)
Total Protein: 7.7 g/dL (ref 6.5–8.1)

## 2018-03-07 LAB — CBC WITH DIFFERENTIAL/PLATELET
Abs Immature Granulocytes: 0.02 10*3/uL (ref 0.00–0.07)
Basophils Absolute: 0 10*3/uL (ref 0.0–0.1)
Basophils Relative: 0 %
Eosinophils Absolute: 0.2 10*3/uL (ref 0.0–0.5)
Eosinophils Relative: 2 %
HCT: 38.1 % (ref 36.0–46.0)
Hemoglobin: 11.9 g/dL — ABNORMAL LOW (ref 12.0–15.0)
Immature Granulocytes: 0 %
Lymphocytes Relative: 34 %
Lymphs Abs: 3.3 10*3/uL (ref 0.7–4.0)
MCH: 29.8 pg (ref 26.0–34.0)
MCHC: 31.2 g/dL (ref 30.0–36.0)
MCV: 95.3 fL (ref 80.0–100.0)
MONOS PCT: 6 %
Monocytes Absolute: 0.6 10*3/uL (ref 0.1–1.0)
Neutro Abs: 5.7 10*3/uL (ref 1.7–7.7)
Neutrophils Relative %: 58 %
Platelets: 184 10*3/uL (ref 150–400)
RBC: 4 MIL/uL (ref 3.87–5.11)
RDW: 13.6 % (ref 11.5–15.5)
WBC: 9.9 10*3/uL (ref 4.0–10.5)
nRBC: 0 % (ref 0.0–0.2)

## 2018-03-07 LAB — URINALYSIS, ROUTINE W REFLEX MICROSCOPIC
Bilirubin Urine: NEGATIVE
Glucose, UA: NEGATIVE mg/dL
Hgb urine dipstick: NEGATIVE
Ketones, ur: NEGATIVE mg/dL
Leukocytes,Ua: NEGATIVE
NITRITE: NEGATIVE
Protein, ur: NEGATIVE mg/dL
Specific Gravity, Urine: 1.015 (ref 1.005–1.030)
pH: 7 (ref 5.0–8.0)

## 2018-03-07 LAB — TROPONIN I: Troponin I: <0.03 ng/mL (ref <0.03)

## 2018-03-07 LAB — LIPASE, BLOOD: Lipase: 28 U/L (ref 11–51)

## 2018-03-07 LAB — PREGNANCY, URINE: Preg Test, Ur: NEGATIVE

## 2018-03-07 MED ORDER — KETOROLAC TROMETHAMINE 30 MG/ML IJ SOLN: 30.0000 mg | Freq: Four times a day (QID) | INTRAMUSCULAR | Status: DC | PRN

## 2018-03-07 MED ORDER — ALBUTEROL SULFATE (2.5 MG/3ML) 0.083% IN NEBU: 3.0000 mL | INHALATION_SOLUTION | Freq: Four times a day (QID) | RESPIRATORY_TRACT | Status: DC | PRN

## 2018-03-07 MED ORDER — PANTOPRAZOLE SODIUM 40 MG PO TBEC
40.0000 mg | DELAYED_RELEASE_TABLET | Freq: Every day | ORAL | Status: DC
  Administered 2018-03-08: 40 mg via ORAL
  Filled 2018-03-07: qty 1

## 2018-03-07 MED ORDER — ACETAMINOPHEN 650 MG RE SUPP: 650.0000 mg | Freq: Four times a day (QID) | RECTAL | Status: DC | PRN

## 2018-03-07 MED ORDER — GABAPENTIN 100 MG PO CAPS
200.0000 mg | ORAL_CAPSULE | Freq: Three times a day (TID) | ORAL | Status: DC
  Administered 2018-03-08 – 2018-03-11 (×10): 200 mg via ORAL
  Filled 2018-03-07 (×12): qty 2

## 2018-03-07 MED ORDER — ONDANSETRON HCL 4 MG/2ML IJ SOLN
4.0000 mg | Freq: Once | INTRAMUSCULAR | Status: AC
  Administered 2018-03-07: 4 mg via INTRAVENOUS
  Filled 2018-03-07: qty 2

## 2018-03-07 MED ORDER — HYDROMORPHONE HCL 1 MG/ML IJ SOLN
1.0000 mg | Freq: Once | INTRAMUSCULAR | Status: AC
  Administered 2018-03-07: 1 mg via INTRAVENOUS
  Filled 2018-03-07: qty 1

## 2018-03-07 MED ORDER — ENOXAPARIN SODIUM 40 MG/0.4ML ~~LOC~~ SOLN
40.0000 mg | SUBCUTANEOUS | Status: AC
  Administered 2018-03-08 – 2018-03-09 (×2): 40 mg via SUBCUTANEOUS
  Filled 2018-03-07 (×2): qty 0.4

## 2018-03-07 MED ORDER — PANTOPRAZOLE SODIUM 40 MG IV SOLR
40.0000 mg | Freq: Once | INTRAVENOUS | Status: AC
  Administered 2018-03-07: 40 mg via INTRAVENOUS
  Filled 2018-03-07: qty 40

## 2018-03-07 MED ORDER — SODIUM CHLORIDE 0.9 % IV SOLN
INTRAVENOUS | Status: AC
  Administered 2018-03-08: 02:00:00 via INTRAVENOUS

## 2018-03-07 MED ORDER — PRAVASTATIN SODIUM 10 MG PO TABS
20.0000 mg | ORAL_TABLET | Freq: Every day | ORAL | Status: DC
  Administered 2018-03-08 – 2018-03-10 (×3): 20 mg via ORAL
  Filled 2018-03-07: qty 1
  Filled 2018-03-07 (×3): qty 2

## 2018-03-07 MED ORDER — MOMETASONE FURO-FORMOTEROL FUM 100-5 MCG/ACT IN AERO
2.0000 | INHALATION_SPRAY | Freq: Two times a day (BID) | RESPIRATORY_TRACT | Status: DC
  Administered 2018-03-08 – 2018-03-11 (×6): 2 via RESPIRATORY_TRACT
  Filled 2018-03-07: qty 8.8

## 2018-03-07 MED ORDER — ONDANSETRON HCL 4 MG PO TABS: 4.0000 mg | ORAL_TABLET | Freq: Four times a day (QID) | ORAL | Status: DC | PRN

## 2018-03-07 MED ORDER — TIZANIDINE HCL 4 MG PO TABS
4.0000 mg | ORAL_TABLET | Freq: Two times a day (BID) | ORAL | Status: DC | PRN
  Filled 2018-03-07: qty 1

## 2018-03-07 MED ORDER — MORPHINE SULFATE (PF) 4 MG/ML IV SOLN
4.0000 mg | Freq: Once | INTRAVENOUS | Status: AC
  Administered 2018-03-07: 4 mg via INTRAVENOUS
  Filled 2018-03-07: qty 1

## 2018-03-07 MED ORDER — ONDANSETRON HCL 4 MG/2ML IJ SOLN
4.0000 mg | Freq: Four times a day (QID) | INTRAMUSCULAR | Status: DC | PRN
  Administered 2018-03-08 – 2018-03-10 (×3): 4 mg via INTRAVENOUS
  Filled 2018-03-07 (×3): qty 2

## 2018-03-07 MED ORDER — SERTRALINE HCL 50 MG PO TABS
50.0000 mg | ORAL_TABLET | Freq: Every day | ORAL | Status: DC
  Administered 2018-03-08 – 2018-03-10 (×3): 50 mg via ORAL
  Filled 2018-03-07 (×4): qty 1

## 2018-03-07 MED ORDER — POTASSIUM CHLORIDE CRYS ER 20 MEQ PO TBCR
20.0000 meq | EXTENDED_RELEASE_TABLET | Freq: Every day | ORAL | Status: DC
  Administered 2018-03-08 – 2018-03-11 (×4): 20 meq via ORAL
  Filled 2018-03-07 (×4): qty 1

## 2018-03-07 MED ORDER — PROPRANOLOL HCL 20 MG PO TABS
10.0000 mg | ORAL_TABLET | Freq: Three times a day (TID) | ORAL | Status: DC
  Administered 2018-03-08 – 2018-03-11 (×8): 10 mg via ORAL
  Filled 2018-03-07 (×12): qty 1

## 2018-03-07 MED ORDER — BISACODYL 5 MG PO TBEC: 5.0000 mg | DELAYED_RELEASE_TABLET | Freq: Every day | ORAL | Status: DC | PRN

## 2018-03-07 MED ORDER — IOHEXOL 300 MG/ML  SOLN
100.0000 mL | Freq: Once | INTRAMUSCULAR | Status: AC | PRN
  Administered 2018-03-07: 100 mL via INTRAVENOUS

## 2018-03-07 MED ORDER — ENSURE ENLIVE PO LIQD
237.0000 mL | Freq: Three times a day (TID) | ORAL | Status: DC
  Administered 2018-03-08 – 2018-03-11 (×3): 237 mL via ORAL
  Filled 2018-03-07 (×5): qty 237

## 2018-03-07 MED ORDER — LINACLOTIDE 145 MCG PO CAPS
290.0000 ug | ORAL_CAPSULE | Freq: Every day | ORAL | Status: DC
  Administered 2018-03-08 – 2018-03-11 (×4): 290 ug via ORAL
  Filled 2018-03-07 (×2): qty 2
  Filled 2018-03-07 (×2): qty 1
  Filled 2018-03-07: qty 2

## 2018-03-07 MED ORDER — ACETAMINOPHEN 325 MG PO TABS: 650.0000 mg | ORAL_TABLET | Freq: Four times a day (QID) | ORAL | Status: DC | PRN

## 2018-03-07 MED ORDER — POLYETHYLENE GLYCOL 3350 17 G PO PACK: 17.0000 g | PACK | Freq: Every day | ORAL | Status: DC | PRN

## 2018-03-07 MED ORDER — DICLOFENAC SODIUM 75 MG PO TBEC: 75.0000 mg | DELAYED_RELEASE_TABLET | Freq: Two times a day (BID) | ORAL | Status: DC

## 2018-03-07 MED ORDER — SUCRALFATE 1 G PO TABS: 1.0000 g | ORAL_TABLET | Freq: Two times a day (BID) | ORAL | Status: DC | PRN

## 2018-03-07 MED ORDER — METOCLOPRAMIDE HCL 5 MG/ML IJ SOLN
10.0000 mg | Freq: Three times a day (TID) | INTRAMUSCULAR | Status: DC
  Administered 2018-03-08 – 2018-03-09 (×5): 10 mg via INTRAVENOUS
  Filled 2018-03-07 (×5): qty 2

## 2018-03-07 MED ORDER — HYDROCODONE-ACETAMINOPHEN 5-325 MG PO TABS: 1.0000 | ORAL_TABLET | ORAL | Status: DC | PRN

## 2018-03-07 MED ORDER — ZONISAMIDE 25 MG PO CAPS
150.0000 mg | ORAL_CAPSULE | Freq: Every day | ORAL | Status: DC
  Administered 2018-03-08 – 2018-03-10 (×3): 150 mg via ORAL
  Filled 2018-03-07 (×7): qty 6

## 2018-03-07 MED ORDER — RISPERIDONE 0.5 MG PO TABS
0.5000 mg | ORAL_TABLET | Freq: Every day | ORAL | Status: DC
  Administered 2018-03-08 – 2018-03-10 (×3): 0.5 mg via ORAL
  Filled 2018-03-07 (×4): qty 1

## 2018-03-07 NOTE — Patient Instructions (Signed)
As we discussed, please seek care at the ED.   If you are admitted, we will likely pursue an endoscopy unless things clinically change and point in another direction.  Please stop diclofenac. This can irritate your stomach.  Further recommendations after you are evaluated in the ED.   It was a pleasure to see you today. I strive to create trusting relationships with patients to provide genuine, compassionate, and quality care. I value your feedback. If you receive a survey regarding your visit,  I greatly appreciate you taking time to fill this out.   Annitta Needs, PhD, ANP-BC [facility]

## 2018-03-07 NOTE — H&P (Signed)
History and Physical    Tracey Daniel SEG:[id number] DOB: [birth year] DOA: 03/07/2018  PCP: [name], MD   Patient coming from: Home   Chief Complaint: Abdominal pain   HPI: Tracey Daniel is a 37 y.o. female with medical history significant for pyloric spasms with functional gastric outlet obstruction and chronic abdominal pain, chronic back pain, depression, and asthma, now presenting to the emergency department for evaluation of abdominal pain and nausea.  Patient is followed by GI for chronic abdominal pain and reports worsening in her pain over the past 3 to 4 weeks.  She also reports severe nausea but has not been vomiting recently.  Denies diarrhea, melena, or hematochezia.  She also denies any recent fevers or chills.  She reports that her pain is diffuse, but most severe in the upper abdomen, worse with eating, and with no alleviating factors identified.  She saw her gastroenterologist for these complaints today and was directed to the ED for further evaluation.  ED Course: Upon arrival to the ED, patient is found to be afebrile, saturating well on room air, bradycardic in the 50s, and with stable blood pressure.  EKG features a sinus rhythm.  Chemistry panel is notable for an elevated BUN to creatinine ratio and CBC is unremarkable.  Troponin is undetectable.  Patient was given IV Protonix, Zofran, morphine, and Dilaudid in the ED.  She continues to complain of severe pain and will be observed for further evaluation and management.  Review of Systems:  All other systems reviewed and apart from HPI, are negative.  Past Medical History:  Diagnosis Date  . Anemia of other chronic disease 11/29/2012  . Asthma   . Back pain, chronic   . Chronic abdominal pain   . Constipation   . COPD (chronic obstructive pulmonary disease) ([place])   . Cyst of skin    mid spine area  . Depression 2000   h/o suicidal ideation  . Gastric outlet obstruction   . Gastritis   .  Gastroparesis   . Migraine   . Migraines   . Nausea and vomiting    recurrent  . Nicotine addiction   . Osteoporosis   . Peptic ulcer disease   . Pyloric spasm 03/30/2011  . Seasonal allergies   . Sinusitis   . Substance abuse ([place]) 2008   marijuana  . Vitamin D deficiency     Past Surgical History:  Procedure Laterality Date  . BALLOON DILATION N/A 02/09/2013   Procedure: BALLOON DILATION;  Surgeon: Missy Sabins, MD;  Location: [facility];  Service: Endoscopy;  Laterality: N/A;  . BOTOX INJECTION N/A 02/09/2013   Procedure: BOTOX INJECTION;  Surgeon: Missy Sabins, MD;  Location: [facility];  Service: Endoscopy;  Laterality: N/A;  possible balloon  . BOTOX INJECTION N/A 10/24/2015   Procedure: BOTOX INJECTION;  Surgeon: Daneil Dolin, MD;  Location: [facility];  Service: Endoscopy;  Laterality: N/A;  . CARPAL TUNNEL RELEASE     left hand  . CHOLECYSTECTOMY     ?2002  . COLONOSCOPY WITH PROPOFOL N/A 09/20/2014   RMR: Normal ileo-colonoscopy  . ESOPHAGEAL DILATION  12/25/2010   Procedure: ESOPHAGEAL DILATION;  Surgeon: Dorothyann Peng, MD;  Location: [facility];  Service: Endoscopy;;  . ESOPHAGOGASTRODUODENOSCOPY  12/25/2010   Dorothyann Peng, MD;  moderate gastritis, ?goo secondary to pylorspasm. BX showed reactive gstropathy no h.pyori, SB mucosa with intramucosal lymphocytosis and partial villous blunting (TTG 4.0 normal)  . ESOPHAGOGASTRODUODENOSCOPY N/A  11/14/2012   PPJ:KDTOI hiatal hernia. Abnormal gastric mucosa of  uncertain significance-status post biopsy. Subjectively, patient may have recurrent symptomatic, pylorospasm.  . ESOPHAGOGASTRODUODENOSCOPY (EGD) WITH PROPOFOL N/A 02/09/2013   elongated stomach, partial lower esophageal ring widely patent. No obvious pyloric stenosis s/p Botox  . ESOPHAGOGASTRODUODENOSCOPY (EGD) WITH PROPOFOL N/A 10/24/2015   Procedure: ESOPHAGOGASTRODUODENOSCOPY (EGD) WITH PROPOFOL;  Surgeon: Daneil Dolin, MD;  Location: [facility];   Service: Endoscopy;  Laterality: N/A;  830   . laser surgery on cervix    . NASAL SEPTUM SURGERY  01/29/2017  . TOE SURGERY       reports that she has been smoking cigarettes. She has a 1.50 pack-year smoking history. She has never used smokeless tobacco. She reports current drug use. Drug: Marijuana. She reports that she does not drink alcohol.  Allergies  Allergen Reactions  . Benadryl [Diphenhydramine] Anaphylaxis  . Latex Itching and Other (See Comments)    "burning" where the tape goes  . Penicillins Other (See Comments)    Unknown Has patient had a PCN reaction causing immediate rash, facial/tongue/throat swelling, SOB or lightheadedness with hypotension Yes Has patient had a PCN reaction causing severe rash involving mucus membranes or skin necrosis: Yes-hives  Has patient had a PCN reaction that required hospitalization Yes Has patient had a PCN reaction occurring within the last 10 years: Yes If all of the above answers are "NO", then may proceed with Cephalosporin use.     Family History  Problem Relation Age of Onset  . Diabetes Father   . Liver disease Father        liver transplant at [facility], age 19  . Lung cancer Mother 54  . Heart disease Maternal Grandmother   . Parkinson's disease Maternal Grandfather   . Multiple sclerosis Sister 105  . Depression Sister 9  . Alcohol abuse Sister   . Bipolar disorder Sister   . Schizophrenia Sister   . Colon cancer Neg Hx      Prior to Admission medications   Medication Sig Start Date End Date Taking? Authorizing Provider  azelastine (ASTELIN) 0.1 % nasal spray USE 2 SPRAYS IN EACH NOSTRIL TWICE DAILY AS DIRECTED Patient taking differently: Place 2 sprays into both nostrils 2 (two) times daily.  09/13/17  Yes [name], MD  BIOTIN PO Take 1 tablet by mouth daily.   Yes [provider]  calcium-vitamin D (OSCAL WITH D) 500-200 MG-UNIT per tablet Take 1 tablet by mouth 2 (two) times daily.   Yes [provider]  chlorhexidine (PERIDEX) 0.12 % solution 5 mLs by Mouth Rinse route every morning.  05/22/16  Yes [provider]  clobetasol cream (TEMOVATE) 7.12 % Apply 1 application topically 2 (two) times daily as needed (rash). Apply to rash   Yes [provider]  diclofenac (VOLTAREN) 75 MG EC tablet TAKE 1 TABLET(75 MG) BY MOUTH TWICE DAILY Patient taking differently: Take 75 mg by mouth 2 (two) times daily.  02/08/18  Yes Mcarthur Rossetti, MD  estradiol (ESTRACE) 1 MG tablet TAKE 1 TABLET(1 MG) BY MOUTH DAILY Patient taking differently: Take 1 mg by mouth daily.  09/29/17  Yes Jonnie Kind, MD  EVENING PRIMROSE OIL PO Take 1 tablet by mouth 2 (two) times daily.    Yes [provider]  feeding supplement (ENSURE CLINICAL STRENGTH) LIQD Take 237 mLs by mouth 3 (three) times daily with meals. 01/01/11  Yes Dhungel, Nishant, MD  fluticasone (FLONASE) 50 MCG/ACT nasal  spray SHAKE LIQUID AND USE 2 SPRAYS IN EACH NOSTRIL DAILY Patient taking differently: Place 2 sprays into both nostrils daily.  11/18/17  Yes [name], MD  gabapentin (NEURONTIN) 100 MG capsule Take 2 capsules (200 mg total) by mouth 3 (three) times daily. 02/08/18  Yes Pete Pelt, PA-C  LINZESS 290 MCG CAPS capsule TAKE 1 CAPSULE(290 MCG) BY MOUTH DAILY Patient taking differently: Take 290 mcg by mouth daily.  08/17/17  Yes Annitta Needs, NP  loratadine (CLARITIN) 10 MG tablet Take 1 tablet (10 mg total) by mouth daily. 10/09/11  Yes [name], MD  metoCLOPramide (REGLAN) 10 MG tablet Take 1 tablet (10 mg total) by mouth every 6 (six) hours. 02/22/18  Yes Recardo Evangelist, PA-C  ondansetron (ZOFRAN ODT) 4 MG disintegrating tablet Take 1 tablet (4 mg total) by mouth every 8 (eight) hours as needed for nausea. 02/16/18  Yes Henderly, Britni A, PA-C  pantoprazole (PROTONIX) 40 MG tablet Take 1 tablet (40 mg total) by mouth daily. 02/22/18  Yes Recardo Evangelist, PA-C  Polyethyl  Glycol-Propyl Glycol (SYSTANE OP) Place 2 drops into the left eye 3 (three) times daily.   Yes [provider]  potassium chloride SA (K-DUR,KLOR-CON) 20 MEQ tablet One tablet once daily by mouth Patient taking differently: Take 20 mEq by mouth daily. One tablet once daily by mouth 08/02/17  Yes [name], MD  pravastatin (PRAVACHOL) 20 MG tablet Take 1 tablet (20 mg total) by mouth daily. Patient taking differently: Take 20 mg by mouth every morning.  08/02/17  Yes [name], MD  PREMARIN vaginal cream INSERT 7.26 APPLICATORFUL VAGINALLY THREE TIMES WEEKLY AS DIRECTED Patient taking differently: Place 2.03 Applicatorfuls vaginally 3 (three) times a week.  12/15/16  Yes Jonnie Kind, MD  progesterone (PROMETRIUM) 200 MG capsule TAKE 1 CAPSULE BY MOUTH EVERY DAY FOR 2 WEEKS EVERY THIRD MONTH: JANUARY, APRIL, JULY AND OCTOBER Patient taking differently: Take 200 mg by mouth See admin instructions. TAKE 1 CAPSULE BY MOUTH EVERY DAY FOR 2 WEEKS EVERY THIRD MONTH: Quentin Mulling AND OCTOBER 10/03/17  Yes Jonnie Kind, MD  propranolol (INDERAL) 10 MG tablet Take 1 tablet (10 mg total) by mouth 3 (three) times daily. 02/18/17  Yes [name], MD  risperiDONE (RISPERDAL) 0.5 MG tablet Take 0.5 mg at bedtime by mouth.   Yes [provider]  sertraline (ZOLOFT) 50 MG tablet Take 50 mg by mouth at bedtime.    Yes [provider]  sucralfate (CARAFATE) 1 g tablet TAKE 1 TABLET BY MOUTH EVERY MORNING AND AT BEDTIME AS NEEDED FOR STOMACH BURNING( MAY CRUSH 1 TABLET AND MIX IN APPLESAUCE) Patient taking differently: Take 1 g by mouth 2 (two) times daily as needed (Stomach burning (may crush one tablet and mix in applesauce.)).  01/31/18  Yes Annitta Needs, NP  SYMBICORT 80-4.5 MCG/ACT inhaler INHALE 2 PUFFS INTO THE LUNGS TWICE DAILY Patient taking differently: Inhale 2 puffs into the lungs 2 (two) times daily.  11/18/17  Yes [name], MD    tiZANidine (ZANAFLEX) 4 MG tablet TAKE 1 TABLET BY MOUTH TWICE DAILY FOR BACK OR SPASM Patient taking differently: Take 4 mg by mouth 2 (two) times daily as needed (back or spasms).  12/15/17  Yes [name], MD  VENTOLIN HFA 108 (90 Base) MCG/ACT inhaler INHALE 2 PUFFS INTO THE LUNGS EVERY 6 HOURS AS NEEDED FOR SHORTNESS OF BREATH Patient taking differently: Inhale  2 puffs into the lungs every 6 (six) hours as needed (shortness of breath.).  09/13/17  Yes [name], MD  zonisamide (ZONEGRAN) 50 MG capsule Take 150 mg by mouth at bedtime.  02/21/17  Yes [provider]  dicyclomine (BENTYL) 20 MG tablet Take 1 tablet (20 mg total) by mouth 2 (two) times daily. Patient not taking: Reported on 03/07/2018 02/16/18   Nettie Elm, PA-C    Physical Exam: Vitals:   03/07/18 2245 03/07/18 2300 03/07/18 2315 03/07/18 2330  BP:  109/70    Pulse: (!) 50 (!) 50 (!) 49 (!) 58  Resp: 14 15 13 18   Temp:      TempSrc:      SpO2: 100% 100% 100% 100%  Weight:      Height:        Constitutional: NAD, calm  Eyes: PERTLA, lids and conjunctivae normal ENMT: Mucous membranes are moist. Posterior pharynx clear of any exudate or lesions.   Neck: normal, supple, no masses, no thyromegaly Respiratory: clear to auscultation bilaterally, no wheezing, no crackles. Normal respiratory effort.   Cardiovascular: S1 & S2 heard, regular rate and rhythm. No extremity edema. 2+ pedal pulses. No carotid bruits. No significant JVD. Abdomen: No distension, soft, diffuse tenderness, no rebound pain or guarding. Bowel sounds active.  Musculoskeletal: no clubbing / cyanosis. No joint deformity upper and lower extremities.    Skin: no significant rashes, lesions, ulcers. Warm, dry, well-perfused. Neurologic: CN 2-12 grossly intact. Sensation intact. Strength 5/5 in all 4 limbs.  Psychiatric: Alert and oriented x 3. Calm, cooperative.    Labs on Admission: I have personally reviewed following  labs and imaging studies  CBC: Recent Labs  Lab 03/07/18 1927  WBC 9.9  NEUTROABS 5.7  HGB 11.9*  HCT 38.1  MCV 95.3  PLT 237   Basic Metabolic Panel: Recent Labs  Lab 03/07/18 1927  NA 140  K 3.8  CL 108  CO2 25  GLUCOSE 85  BUN 21*  CREATININE 0.85  CALCIUM 9.7   GFR: Estimated Creatinine Clearance: 82.4 mL/min (by C-G formula based on SCr of 0.85 mg/dL). Liver Function Tests: Recent Labs  Lab 03/07/18 1927  AST 28  ALT 36  ALKPHOS 60  BILITOT 0.1*  PROT 7.7  ALBUMIN 4.7   Recent Labs  Lab 03/07/18 1927  LIPASE 28   No results for input(s): AMMONIA in the last 168 hours. Coagulation Profile: No results for input(s): INR, PROTIME in the last 168 hours. Cardiac Enzymes: Recent Labs  Lab 03/07/18 1927  TROPONINI <0.03   BNP (last 3 results) No results for input(s): PROBNP in the last 8760 hours. HbA1C: No results for input(s): HGBA1C in the last 72 hours. CBG: No results for input(s): GLUCAP in the last 168 hours. Lipid Profile: No results for input(s): CHOL, HDL, LDLCALC, TRIG, CHOLHDL, LDLDIRECT in the last 72 hours. Thyroid Function Tests: No results for input(s): TSH, T4TOTAL, FREET4, T3FREE, THYROIDAB in the last 72 hours. Anemia Panel: No results for input(s): VITAMINB12, FOLATE, FERRITIN, TIBC, IRON, RETICCTPCT in the last 72 hours. Urine analysis:    Component Value Date/Time   COLORURINE YELLOW 03/07/2018 1855   APPEARANCEUR CLOUDY (A) 03/07/2018 1855   LABSPEC 1.015 03/07/2018 1855   PHURINE 7.0 03/07/2018 1855   GLUCOSEU NEGATIVE 03/07/2018 1855   HGBUR NEGATIVE 03/07/2018 1855   HGBUR trace-intact 11/21/2008 [place] NEGATIVE 03/07/2018 1855   BILIRUBINUR neg 07/31/2014 [place] 03/07/2018 1855  PROTEINUR NEGATIVE 03/07/2018 1855   UROBILINOGEN 0.2 11/08/2014 1730   NITRITE NEGATIVE 03/07/2018 1855   LEUKOCYTESUR NEGATIVE 03/07/2018 1855   Sepsis  Labs: @LABRCNTIP (procalcitonin:4,lacticidven:4) )No results found for this or any previous visit (from the past 240 hour(s)).   Radiological Exams on Admission: Ct Abdomen Pelvis W Contrast  Result Date: 03/07/2018 CLINICAL DATA:  38 year old female with abdominal pain for 3 weeks. History of gastroparesis. EXAM: CT ABDOMEN AND PELVIS WITH CONTRAST TECHNIQUE: Multidetector CT imaging of the abdomen and pelvis was performed using the standard protocol following bolus administration of intravenous contrast. CONTRAST:  143mL OMNIPAQUE IOHEXOL 300 MG/ML  SOLN COMPARISON:  CT Abdomen and Pelvis 02/16/2018 and earlier. FINDINGS: Lower chest: Minor lung base atelectasis. No cardiomegaly, pericardial effusion, pleural effusion. Hepatobiliary: Surgically absent gallbladder. Negative liver. Pancreas: Negative. Spleen: Negative. Adrenals/Urinary Tract: Normal adrenal glands. Bilateral renal enhancement and contrast excretion is symmetric and within normal limits. Proximal ureters are decompressed. No perinephric stranding. Unremarkable urinary bladder. Stomach/Bowel: Redundant large bowel with increased retained stool compared to last month, but no large bowel inflammation. There is retained oral contrast in the appendix which remains normal (series 2, image 49). Negative terminal ileum. No dilated small bowel. The stomach is fairly decompressed. The duodenum is stable and within normal limits. No free air, free fluid. Vascular/Lymphatic: Major arterial structures appear patent. There is mild soft atherosclerotic plaque in the distal aorta. Portal venous system is patent. No lymphadenopathy. Reproductive: Surgically absent uterus. Diminutive or absent ovaries. Other: No pelvic free fluid. Musculoskeletal: Negative. IMPRESSION: Increased stool in redundant large bowel since the CT last month, otherwise negative. Electronically Signed   By: Genevie Ann M.D.   On: 03/07/2018 22:49    EKG: Independently reviewed. Sinus  rhythm.   Assessment/Plan   1. Intractable abdominal pain   - Follows with GI for chronic abdominal pain with pyloric spasm treated with Botox injections in 2017, now presenting with worse pain and nausea  - Exam is benign, CT without acute findings, and labs with mildly elevated VBT:YOMAYOKHTX ratio  - She was treated in ED with Zofran, IV PPI, morphine, and Dilaudid, but continues to complain of severe pain  - Continue scheduled Reglan, as-needed Zofran, IVF hydration, and pain-control    2. Depression   - Followed by psychiatry and managed with risperidone and Zoloft, will continue   3. Asthma  - No SOB, cough, or wheeze on admission   - Continue ICS/LABA and prn albuterol    DVT prophylaxis: Lovenox  Code Status: Full  Family Communication: Discussed with patient  Consults called: None  Admission status: Observation     Vianne Bulls, MD [facility] Pager [phone]  If 7PM-7AM, please contact night-coverage [email or web address] Password [facility]  03/07/2018, 11:46 PM

## 2018-03-07 NOTE — ED Provider Notes (Signed)
[facility] EMERGENCY DEPARTMENT Provider Note   CSN: [id number] Arrival date & time: 03/07/18  1430    History   Chief Complaint Chief Complaint  Patient presents with  . Abdominal Pain    HPI Tracey Daniel is a 38 y.o. female.  He is a history of chronic abdominal pain and gastroparesis.  She said for 3 to 4 weeks it has been getting more severe.  It has been associated with vomiting and diarrhea although those have stopped.  She continues to be nauseous.  She rates the pain is severe in its in the center of her abdomen although it then generalizes to involve her entire abdomen and up into her chest.  She saw the nurse practitioner at Dr. Orvan Falconer office today.  They told her she may need another endoscopy and if the emergency room admits her they may choose to do it while she is admitted.      The history is provided by the patient.  Abdominal Pain  Pain location:  Epigastric Pain quality: stabbing   Pain radiates to:  R flank, chest and L flank Pain severity:  Severe Onset quality:  Sudden Timing:  Constant Progression:  Unchanged Chronicity:  New Context: not suspicious food intake and not trauma   Relieved by:  Nothing Worsened by:  Palpation and eating Ineffective treatments:  OTC medications Associated symptoms: chest pain, diarrhea, nausea and vomiting   Associated symptoms: no cough, no dysuria, no fever, no hematemesis, no hematochezia, no hematuria, no melena, no shortness of breath, no sore throat, no vaginal bleeding and no vaginal discharge     Past Medical History:  Diagnosis Date  . Anemia of other chronic disease 11/29/2012  . Asthma   . Back pain, chronic   . Chronic abdominal pain   . Constipation   . COPD (chronic obstructive pulmonary disease) ([place])   . Cyst of skin    mid spine area  . Depression 2000   h/o suicidal ideation  . Gastric outlet obstruction   . Gastritis   . Gastroparesis   . Migraine   . Migraines   . Nausea and  vomiting    recurrent  . Nicotine addiction   . Osteoporosis   . Peptic ulcer disease   . Pyloric spasm 03/30/2011  . Seasonal allergies   . Sinusitis   . Substance abuse ([place]) 2008   marijuana  . Vitamin D deficiency     Patient Active Problem List   Diagnosis Date Noted  . Dermatitis 02/01/2018  . Hemorrhoids 02/22/2017  . Nicotine dependence, cigarettes, with other nicotine-induced disorders 12/12/2016  . Thoracic spine pain 11/23/2016  . Nasal obstruction 11/23/2016  . Hyperlipemia 10/02/2015  . Premature ovarian failure 02/04/2015  . Pregnancy as incidental finding, low positive preg test, considered pituitary production of HCG 02/04/2015  . Abdominal pain, epigastric 08/27/2014  . Vitamin D deficiency 05/16/2014  . Alopecia 08/01/2013  . Asthma, cough variant 12/01/2012  . 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 Marijuana use 11/14/2012  . Seasonal allergies 11/14/2012  . Nausea without vomiting 11/09/2012  . Constipation 12/02/2011  . Gastroparesis 05/01/2011  . Pyloric spasm 03/30/2011  . Hip pain, right 03/02/2011  . Anemia 01/30/2011  . Gastric outlet obstruction 01/01/2011  . GERD (gastroesophageal reflux disease) 08/21/2010  . Headache 03/13/2008  . AMENORRHEA, SECONDARY 09/27/2007  . ECZEMA 09/27/2007  . Depression 07/01/2007  . Back pain with radiation 07/01/2007  . CLOSED FRACTURE OF METATARSAL BONE 01/27/2007  Past Surgical History:  Procedure Laterality Date  . BALLOON DILATION N/A 02/09/2013   Procedure: BALLOON DILATION;  Surgeon: Missy Sabins, MD;  Location: [facility];  Service: Endoscopy;  Laterality: N/A;  . BOTOX INJECTION N/A 02/09/2013   Procedure: BOTOX INJECTION;  Surgeon: Missy Sabins, MD;  Location: [facility]COPY;  Service: Endoscopy;  Laterality: N/A;  possible balloon  . BOTOX INJECTION N/A 10/24/2015   Procedure: BOTOX INJECTION;  Surgeon: Daneil Dolin, MD;  Location: [facility]ITE;  Service: Endoscopy;   Laterality: N/A;  . CARPAL TUNNEL RELEASE     left hand  . CHOLECYSTECTOMY     ?2002  . COLONOSCOPY WITH PROPOFOL N/A 09/20/2014   RMR: Normal ileo-colonoscopy  . ESOPHAGEAL DILATION  12/25/2010   Procedure: ESOPHAGEAL DILATION;  Surgeon: Dorothyann Peng, MD;  Location: [facility];  Service: Endoscopy;;  . ESOPHAGOGASTRODUODENOSCOPY  12/25/2010   Dorothyann Peng, MD;  moderate gastritis, ?goo secondary to pylorspasm. BX showed reactive gstropathy no h.pyori, SB mucosa with intramucosal lymphocytosis and partial villous blunting (TTG 4.0 normal)  . ESOPHAGOGASTRODUODENOSCOPY N/A 11/14/2012   FBP:ZWCHE hiatal hernia. Abnormal gastric mucosa of  uncertain significance-status post biopsy. Subjectively, patient may have recurrent symptomatic, pylorospasm.  . ESOPHAGOGASTRODUODENOSCOPY (EGD) WITH PROPOFOL N/A 02/09/2013   elongated stomach, partial lower esophageal ring widely patent. No obvious pyloric stenosis s/p Botox  . ESOPHAGOGASTRODUODENOSCOPY (EGD) WITH PROPOFOL N/A 10/24/2015   Procedure: ESOPHAGOGASTRODUODENOSCOPY (EGD) WITH PROPOFOL;  Surgeon: Daneil Dolin, MD;  Location: [facility];  Service: Endoscopy;  Laterality: N/A;  830   . laser surgery on cervix    . NASAL SEPTUM SURGERY  01/29/2017  . TOE SURGERY       OB History    Gravida  0   Para      Term      Preterm      AB      Living        SAB      TAB      Ectopic      Multiple      Live Births               Home Medications    Prior to Admission medications   Medication Sig Start Date End Date Taking? Authorizing Provider  azelastine (ASTELIN) 0.1 % nasal spray USE 2 SPRAYS IN EACH NOSTRIL TWICE DAILY AS DIRECTED Patient taking differently: Place 2 sprays into both nostrils 2 (two) times daily.  09/13/17   [name], MD  BIOTIN PO Take 1 tablet by mouth daily.    [provider]  calcium-vitamin D (OSCAL WITH D) 500-200 MG-UNIT per tablet Take 1 tablet by mouth 2 (two) times  daily.    [provider]  chlorhexidine (PERIDEX) 0.12 % solution 5 mLs by Mouth Rinse route daily. 05/22/16   [provider]  clobetasol cream (TEMOVATE) 5.27 % Apply 1 application topically 2 (two) times daily as needed (rash). Apply to rash    [provider]  diclofenac (VOLTAREN) 75 MG EC tablet TAKE 1 TABLET(75 MG) BY MOUTH TWICE DAILY Patient taking differently: Take 75 mg by mouth 2 (two) times daily.  02/08/18   Mcarthur Rossetti, MD  dicyclomine (BENTYL) 20 MG tablet Take 1 tablet (20 mg total) by mouth 2 (two) times daily. 02/16/18   Henderly, Britni A, PA-C  estradiol (ESTRACE) 1 MG tablet TAKE 1 TABLET(1 MG) BY MOUTH DAILY Patient taking differently: Take 1 mg  by mouth daily.  09/29/17   Jonnie Kind, MD  EVENING PRIMROSE OIL PO Take 1 tablet by mouth 2 (two) times daily.     [provider]  feeding supplement (ENSURE CLINICAL STRENGTH) LIQD Take 237 mLs by mouth 3 (three) times daily with meals. 01/01/11   Dhungel, Nishant, MD  fluticasone (FLONASE) 50 MCG/ACT nasal spray SHAKE LIQUID AND USE 2 SPRAYS IN EACH NOSTRIL DAILY Patient taking differently: Place 2 sprays into both nostrils daily.  11/18/17   [name], MD  gabapentin (NEURONTIN) 100 MG capsule Take 2 capsules (200 mg total) by mouth 3 (three) times daily. 02/08/18   Carlis Abbott, Gillermo Murdoch, PA-C  LINZESS 290 MCG CAPS capsule TAKE 1 CAPSULE(290 MCG) BY MOUTH DAILY Patient taking differently: Take 290 mcg by mouth daily.  08/17/17   Annitta Needs, NP  loratadine (CLARITIN) 10 MG tablet Take 1 tablet (10 mg total) by mouth daily. 10/09/11   [name], MD  metoCLOPramide (REGLAN) 10 MG tablet Take 1 tablet (10 mg total) by mouth every 6 (six) hours. 02/22/18   Recardo Evangelist, PA-C  ondansetron (ZOFRAN ODT) 4 MG disintegrating tablet Take 1 tablet (4 mg total) by mouth every 8 (eight) hours as needed for nausea. 02/16/18   Henderly, Britni A, PA-C  pantoprazole (PROTONIX) 40  MG tablet Take 1 tablet (40 mg total) by mouth daily. 02/22/18   Recardo Evangelist, PA-C  Polyethyl Glycol-Propyl Glycol (SYSTANE OP) Place 2 drops into the left eye 3 (three) times daily.    [provider]  potassium chloride SA (K-DUR,KLOR-CON) 20 MEQ tablet One tablet once daily by mouth Patient taking differently: Take 20 mEq by mouth daily. One tablet once daily by mouth 08/02/17   [name], MD  pravastatin (PRAVACHOL) 20 MG tablet Take 1 tablet (20 mg total) by mouth daily. 08/02/17   [name], MD  PREMARIN vaginal cream INSERT 7.02 APPLICATORFUL VAGINALLY THREE TIMES WEEKLY AS DIRECTED Patient taking differently: Place 6.37 Applicatorfuls vaginally 3 (three) times a week.  12/15/16   Jonnie Kind, MD  progesterone (PROMETRIUM) 200 MG capsule TAKE 1 CAPSULE BY MOUTH EVERY DAY FOR 2 WEEKS EVERY THIRD MONTH: JANUARY, APRIL, JULY AND OCTOBER Patient taking differently: Take 200 mg by mouth See admin instructions. TAKE 1 CAPSULE BY MOUTH EVERY DAY FOR 2 WEEKS EVERY THIRD MONTH: Quentin Mulling AND OCTOBER 10/03/17   Jonnie Kind, MD  propranolol (INDERAL) 10 MG tablet Take 1 tablet (10 mg total) by mouth 3 (three) times daily. 02/18/17   [name], MD  risperiDONE (RISPERDAL) 0.5 MG tablet Take 0.5 mg at bedtime by mouth.    [provider]  sertraline (ZOLOFT) 50 MG tablet Take 50 mg daily by mouth.    [provider]  sucralfate (CARAFATE) 1 g tablet TAKE 1 TABLET BY MOUTH EVERY MORNING AND AT BEDTIME AS NEEDED FOR STOMACH BURNING( MAY CRUSH 1 TABLET AND MIX IN APPLESAUCE) Patient taking differently: Take 1 g by mouth 2 (two) times daily as needed (Stomach burning (may crush one tablet and mix in applesauce.)).  01/31/18   Annitta Needs, NP  SYMBICORT 80-4.5 MCG/ACT inhaler INHALE 2 PUFFS INTO THE LUNGS TWICE DAILY Patient taking differently: Inhale 2 puffs into the lungs 2 (two) times daily.  11/18/17   [name], MD   tiZANidine (ZANAFLEX) 4 MG tablet TAKE 1 TABLET BY MOUTH TWICE DAILY FOR BACK OR SPASM Patient taking differently: Take  4 mg by mouth 2 (two) times daily as needed (back or spasms).  12/15/17   [name], MD  VENTOLIN HFA 108 (90 Base) MCG/ACT inhaler INHALE 2 PUFFS INTO THE LUNGS EVERY 6 HOURS AS NEEDED FOR SHORTNESS OF BREATH Patient taking differently: Inhale 2 puffs into the lungs every 6 (six) hours as needed (shortness of breath.).  09/13/17   [name], MD  zonisamide (ZONEGRAN) 50 MG capsule Take 150 mg by mouth daily. 02/21/17   [provider]    Family History Family History  Problem Relation Age of Onset  . Diabetes Father   . Liver disease Father        liver transplant at [facility], age 37  . Lung cancer Mother 59  . Heart disease Maternal Grandmother   . Parkinson's disease Maternal Grandfather   . Multiple sclerosis Sister 57  . Depression Sister 4  . Alcohol abuse Sister   . Bipolar disorder Sister   . Schizophrenia Sister   . Colon cancer Neg Hx     Social History Social History   Tobacco Use  . Smoking status: Current Every Day Smoker    Packs/day: 0.10    Years: 15.00    Pack years: 1.50    Types: Cigarettes  . Smokeless tobacco: Never Used  . Tobacco comment: 2 per day  Substance Use Topics  . Alcohol use: No    Alcohol/week: 0.0 standard drinks  . Drug use: Yes    Types: Marijuana     Allergies   Benadryl [diphenhydramine]; Latex; and Penicillins   Review of Systems Review of Systems  Constitutional: Negative for fever.  HENT: Negative for sore throat.   Eyes: Negative for visual disturbance.  Respiratory: Negative for cough and shortness of breath.   Cardiovascular: Positive for chest pain.  Gastrointestinal: Positive for abdominal pain, diarrhea, nausea and vomiting. Negative for hematemesis, hematochezia and melena.  Genitourinary: Negative for dysuria, hematuria, vaginal bleeding and vaginal discharge.   Musculoskeletal: Negative for neck pain.  Skin: Negative for rash.  Neurological: Negative for headaches.     Physical Exam Updated Vital Signs BP 135/74 (BP Location: Right Arm)   Pulse (!) 59   Temp 97.8 F (36.6 C) (Oral)   Resp 18   Ht 5\' 6"  (1.676 m)   Wt 67.6 kg   SpO2 100%   BMI 24.05 kg/m   Physical Exam Vitals signs and nursing note reviewed.  Constitutional:      General: She is not in acute distress.    Appearance: She is well-developed.  HENT:     Head: Normocephalic and atraumatic.  Eyes:     Conjunctiva/sclera: Conjunctivae normal.  Neck:     Musculoskeletal: Neck supple.  Cardiovascular:     Rate and Rhythm: Normal rate and regular rhythm.     Pulses: Normal pulses.     Heart sounds: No murmur.  Pulmonary:     Effort: Pulmonary effort is normal. No respiratory distress.     Breath sounds: Normal breath sounds.  Abdominal:     Palpations: Abdomen is soft.     Tenderness: There is abdominal tenderness (generalized). There is no guarding.  Musculoskeletal: Normal range of motion.        General: No signs of injury.     Right lower leg: No edema.     Left lower leg: No edema.  Skin:    General: Skin is warm and dry.     Capillary Refill: Capillary refill takes  less than 2 seconds.  Neurological:     General: No focal deficit present.     Mental Status: She is alert and oriented to person, place, and time.     Gait: Gait normal.      ED Treatments / Results  Labs (all labs ordered are listed, but only abnormal results are displayed) Labs Reviewed  CBC WITH DIFFERENTIAL/PLATELET - Abnormal; Notable for the following components:      Result Value   Hemoglobin 11.9 (*)    All other components within normal limits  COMPREHENSIVE METABOLIC PANEL - Abnormal; Notable for the following components:   BUN 21 (*)    Total Bilirubin 0.1 (*)    All other components within normal limits  URINALYSIS, ROUTINE W REFLEX MICROSCOPIC - Abnormal; Notable for  the following components:   APPearance CLOUDY (*)    All other components within normal limits  LIPASE, BLOOD  TROPONIN I  PREGNANCY, URINE    EKG EKG Interpretation  Date/Time:  Monday March 07 2018 19:35:50 EST Ventricular Rate:  61 PR Interval:    QRS Duration: 101 QT Interval:  423 QTC Calculation: 427 R Axis:   82 Text Interpretation:  Sinus rhythm Probable left atrial enlargement Borderline T abnormalities, inferior leads similar to prior 1/20 Confirmed by Aletta Edouard [phone]) on 03/07/2018 7:54:00 PM   Radiology Ct Abdomen Pelvis W Contrast  Result Date: 03/07/2018 CLINICAL DATA:  38 year old female with abdominal pain for 3 weeks. History of gastroparesis. EXAM: CT ABDOMEN AND PELVIS WITH CONTRAST TECHNIQUE: Multidetector CT imaging of the abdomen and pelvis was performed using the standard protocol following bolus administration of intravenous contrast. CONTRAST:  172mL OMNIPAQUE IOHEXOL 300 MG/ML  SOLN COMPARISON:  CT Abdomen and Pelvis 02/16/2018 and earlier. FINDINGS: Lower chest: Minor lung base atelectasis. No cardiomegaly, pericardial effusion, pleural effusion. Hepatobiliary: Surgically absent gallbladder. Negative liver. Pancreas: Negative. Spleen: Negative. Adrenals/Urinary Tract: Normal adrenal glands. Bilateral renal enhancement and contrast excretion is symmetric and within normal limits. Proximal ureters are decompressed. No perinephric stranding. Unremarkable urinary bladder. Stomach/Bowel: Redundant large bowel with increased retained stool compared to last month, but no large bowel inflammation. There is retained oral contrast in the appendix which remains normal (series 2, image 49). Negative terminal ileum. No dilated small bowel. The stomach is fairly decompressed. The duodenum is stable and within normal limits. No free air, free fluid. Vascular/Lymphatic: Major arterial structures appear patent. There is mild soft atherosclerotic plaque in the distal aorta.  Portal venous system is patent. No lymphadenopathy. Reproductive: Surgically absent uterus. Diminutive or absent ovaries. Other: No pelvic free fluid. Musculoskeletal: Negative. IMPRESSION: Increased stool in redundant large bowel since the CT last month, otherwise negative. Electronically Signed   By: Genevie Ann M.D.   On: 03/07/2018 22:49    Procedures Procedures (including critical care time)  Medications Ordered in ED Medications  morphine 4 MG/ML injection 4 mg (4 mg Intravenous Given 03/07/18 1932)  pantoprazole (PROTONIX) injection 40 mg (40 mg Intravenous Given 03/07/18 1933)  ondansetron (ZOFRAN) injection 4 mg (4 mg Intravenous Given 03/07/18 1954)  HYDROmorphone (DILAUDID) injection 1 mg (1 mg Intravenous Given 03/07/18 2050)  iohexol (OMNIPAQUE) 300 MG/ML solution 100 mL (100 mLs Intravenous Contrast Given 03/07/18 2215)     Initial Impression / Assessment and Plan / ED Course  I have reviewed the triage vital signs and the nursing notes.  Pertinent labs & imaging results that were available during my care of the patient were reviewed by me and considered  in my medical decision making (see chart for details).  Clinical Course as of Mar 07 2310  Mon Mar 07, 2018  2042 Patient with chronic gastroparesis here with 3 weeks of worsening pain.  She has been at [facility] twice and had a normal CAT scan.  She was at the GI doctor's office today and they told her she may need to come to the emergency department and possibly even need admission for another endoscopy.  She is received some pain medicine here without any improvement in her symptoms.  Her lab work is unremarkable.  She said she is having too much pain to be able to go home so I have put her in for a CT and will give her another dose of some pain medicine.  Likely will need to be admitted.   [MB]  2309 Patient CT here shows no acute findings other than increased constipation.  I reviewed this with her and she still feels she needs  to be admitted to undergo endoscopy to try to figure out what is causing her symptoms.  I talked with Dr. Myna Hidalgo from the hospitalist service who will evaluate her in the ED.   [MB]    Clinical Course User Index [MB] Hayden Rasmussen, MD       Final Clinical Impressions(s) / ED Diagnoses   Final diagnoses:  Generalized abdominal pain  Gastroparesis  Constipation, unspecified constipation type    ED Discharge Orders    None       Hayden Rasmussen, MD 03/07/18 2313

## 2018-03-07 NOTE — Progress Notes (Signed)
Referring Provider: [name], MD Primary Care Physician:  [name], MD Primary GI: Dr. Gala Romney   Chief Complaint  Patient presents with  . Abdominal Pain    Severe Pain 3-4 weeks. pt has been to the ED 2 times for her pain. pt was given some nasuea and pain med. pt is still having nasuea. no vomiting. pt states the pain is unbearable and she feels shooting pains in her chest.     HPI:   Tracey Daniel is a 38 y.o. female presenting today with a history of abdominal pain, N/V, GERD, constipation. 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 Presents today with pain that has been worsening over the past 3 weeks.   Presented to the ED on 1/29 and 02/21/2018. She did have a CT abd/pelvis with contrast Feb 16, 2018 without acute abnormalities. Leukocytosis noted with WBC count 24.5 on initial presentation 1/29. On 2/3, this had completed normalized. However, she did have drop in Hg to 10.5 from 12.7 during that time span. Most recent blood work 02/21/2018 with normal LFTs, normal lipase.  Was started on diclofenac early February due to back pain. Still taking. Just started Protonix about a week prior to ED visit. Believes she saw melena at initial onset of pain, N/V. Heme negative in ED. BM not often. Taking Linzess daily but nothing is coming out. Drinking liquids but fills her up. Notes early satiety. Tried a hot dog but feels full off a few bites.   Pain started 3 weeks ago. Pain with eating or taking pills. Started on a Tuesday. Felt fine then had acute onset of nausea, vomiting, diarrhea. Pain diffusely across abdomen and in epigastric region. Radiating to back. No relief after vomiting. Mild nausea but more pain than nausea. Feels bloated. +flatus. Stomach making lots of noise. While vomiting saw bright red blood. No recent emesis.   Given Reglan and Zofran. Feels like she is about to "bust".  During visit she was calm but progressively became more anxious and moaning, hunching over in chair.   As of note, she has a history of chronic abdominal wall pain and had relief of her abdominal pain status post celiac plexus block via splanchnic approach on 03/27/2014.   Past Medical History:  Diagnosis Date  . Anemia of other chronic disease 11/29/2012  . Asthma   . Back pain, chronic   . Chronic abdominal pain   . Constipation   . COPD (chronic obstructive pulmonary disease) ([place])   . Cyst of skin    mid spine area  . Depression 2000   h/o suicidal ideation  . Gastric outlet obstruction   . Gastritis   . Gastroparesis   . Migraine   . Migraines   . Nausea and vomiting    recurrent  . Nicotine addiction   . Osteoporosis   . Peptic ulcer disease   . Pyloric spasm 03/30/2011  . Seasonal allergies   . Sinusitis   . Substance abuse ([place]) 2008   marijuana  . Vitamin D deficiency     Past Surgical History:  Procedure Laterality Date  . BALLOON DILATION N/A 02/09/2013   Procedure: BALLOON DILATION;  Surgeon: Missy Sabins, MD;  Location: [facility];  Service: Endoscopy;  Laterality: N/A;  . BOTOX INJECTION N/A 02/09/2013   Procedure: BOTOX INJECTION;  Surgeon: Missy Sabins, MD;  Location: [facility];  Service: Endoscopy;  Laterality: N/A;  possible balloon  . BOTOX INJECTION N/A 10/24/2015   Procedure: BOTOX INJECTION;  Surgeon: Daneil Dolin, MD;  Location: [facility];  Service: Endoscopy;  Laterality: N/A;  . CARPAL TUNNEL RELEASE     left hand  . CHOLECYSTECTOMY     ?2002  . COLONOSCOPY WITH PROPOFOL N/A 09/20/2014   RMR: Normal ileo-colonoscopy  . ESOPHAGEAL DILATION  12/25/2010   Procedure: ESOPHAGEAL DILATION;  Surgeon: Dorothyann Peng, MD;  Location: [facility];  Service: Endoscopy;;  . ESOPHAGOGASTRODUODENOSCOPY  12/25/2010   Dorothyann Peng, MD;  moderate gastritis, ?goo secondary to pylorspasm. BX showed reactive gstropathy no h.pyori, SB mucosa with  intramucosal lymphocytosis and partial villous blunting (TTG 4.0 normal)  . ESOPHAGOGASTRODUODENOSCOPY N/A 11/14/2012   MWN:UUVOZ hiatal hernia. Abnormal gastric mucosa of  uncertain significance-status post biopsy. Subjectively, patient may have recurrent symptomatic, pylorospasm.  . ESOPHAGOGASTRODUODENOSCOPY (EGD) WITH PROPOFOL N/A 02/09/2013   elongated stomach, partial lower esophageal ring widely patent. No obvious pyloric stenosis s/p Botox  . ESOPHAGOGASTRODUODENOSCOPY (EGD) WITH PROPOFOL N/A 10/24/2015   Procedure: ESOPHAGOGASTRODUODENOSCOPY (EGD) WITH PROPOFOL;  Surgeon: Daneil Dolin, MD;  Location: [facility];  Service: Endoscopy;  Laterality: N/A;  830   . laser surgery on cervix    . NASAL SEPTUM SURGERY  01/29/2017  . TOE SURGERY      Current Outpatient Medications  Medication Sig Dispense Refill  . azelastine (ASTELIN) 0.1 % nasal spray USE 2 SPRAYS IN EACH NOSTRIL TWICE DAILY AS DIRECTED (Patient taking differently: Place 2 sprays into both nostrils 2 (two) times daily. ) 30 mL 5  . BIOTIN PO Take 1 tablet by mouth daily.    . calcium-vitamin D (OSCAL WITH D) 500-200 MG-UNIT per tablet Take 1 tablet by mouth 2 (two) times daily.    . chlorhexidine (PERIDEX) 0.12 % solution 5 mLs by Mouth Rinse route daily.  2  . clobetasol cream (TEMOVATE) 3.66 % Apply 1 application topically 2 (two) times daily as needed (rash). Apply to rash    . diclofenac (VOLTAREN) 75 MG EC tablet TAKE 1 TABLET(75 MG) BY MOUTH TWICE DAILY (Patient taking differently: Take 75 mg by mouth 2 (two) times daily. ) 180 tablet 1  . dicyclomine (BENTYL) 20 MG tablet Take 1 tablet (20 mg total) by mouth 2 (two) times daily. 20 tablet 0  . estradiol (ESTRACE) 1 MG tablet TAKE 1 TABLET(1 MG) BY MOUTH DAILY (Patient taking differently: Take 1 mg by mouth daily. ) 90 tablet 3  . EVENING PRIMROSE OIL PO Take 1 tablet by mouth 2 (two) times daily.     . feeding supplement (ENSURE CLINICAL STRENGTH) LIQD Take 237 mLs  by mouth 3 (three) times daily with meals. 10 Bottle 3  . fluticasone (FLONASE) 50 MCG/ACT nasal spray SHAKE LIQUID AND USE 2 SPRAYS IN EACH NOSTRIL DAILY (Patient taking differently: Place 2 sprays into both nostrils daily. ) 48 g 5  . gabapentin (NEURONTIN) 100 MG capsule Take 2 capsules (200 mg total) by mouth 3 (three) times daily. 180 capsule 0  . LINZESS 290 MCG CAPS capsule TAKE 1 CAPSULE(290 MCG) BY MOUTH DAILY (Patient taking differently: Take 290 mcg by mouth daily. ) 90 capsule 3  . loratadine (CLARITIN) 10 MG tablet Take 1 tablet (10 mg total) by mouth daily. 30 tablet 5  . metoCLOPramide (REGLAN) 10 MG tablet Take 1 tablet (10 mg total) by mouth every 6 (six) hours. 10 tablet 0  . ondansetron (ZOFRAN ODT) 4 MG disintegrating  tablet Take 1 tablet (4 mg total) by mouth every 8 (eight) hours as needed for nausea. 10 tablet 0  . pantoprazole (PROTONIX) 40 MG tablet Take 1 tablet (40 mg total) by mouth daily. 30 tablet 0  . Polyethyl Glycol-Propyl Glycol (SYSTANE OP) Place 2 drops into the left eye 3 (three) times daily.    . potassium chloride SA (K-DUR,KLOR-CON) 20 MEQ tablet One tablet once daily by mouth (Patient taking differently: Take 20 mEq by mouth daily. One tablet once daily by mouth) 90 tablet 3  . pravastatin (PRAVACHOL) 20 MG tablet Take 1 tablet (20 mg total) by mouth daily. 90 tablet 3  . PREMARIN vaginal cream INSERT 0.25 APPLICATORFUL VAGINALLY THREE TIMES WEEKLY AS DIRECTED (Patient taking differently: Place 4.27 Applicatorfuls vaginally 3 (three) times a week. ) 30 g 2  . progesterone (PROMETRIUM) 200 MG capsule TAKE 1 CAPSULE BY MOUTH EVERY DAY FOR 2 WEEKS EVERY THIRD MONTH: JANUARY, APRIL, [place] (Patient taking differently: Take 200 mg by mouth See admin instructions. TAKE 1 CAPSULE BY MOUTH EVERY DAY FOR 2 WEEKS EVERY THIRD MONTH: JANUARY, APRIL, JULY AND OCTOBER) 14 capsule 2  . propranolol (INDERAL) 10 MG tablet Take 1 tablet (10 mg total) by mouth 3 (three)  times daily. 90 tablet 2  . risperiDONE (RISPERDAL) 0.5 MG tablet Take 0.5 mg at bedtime by mouth.    . sertraline (ZOLOFT) 50 MG tablet Take 50 mg daily by mouth.    . sucralfate (CARAFATE) 1 g tablet TAKE 1 TABLET BY MOUTH EVERY MORNING AND AT BEDTIME AS NEEDED FOR STOMACH BURNING( MAY CRUSH 1 TABLET AND MIX IN APPLESAUCE) (Patient taking differently: Take 1 g by mouth 2 (two) times daily as needed (Stomach burning (may crush one tablet and mix in applesauce.)). ) 180 tablet 0  . SYMBICORT 80-4.5 MCG/ACT inhaler INHALE 2 PUFFS INTO THE LUNGS TWICE DAILY (Patient taking differently: Inhale 2 puffs into the lungs 2 (two) times daily. ) 10.2 g 5  . tiZANidine (ZANAFLEX) 4 MG tablet TAKE 1 TABLET BY MOUTH TWICE DAILY FOR BACK OR SPASM (Patient taking differently: Take 4 mg by mouth 2 (two) times daily as needed (back or spasms). ) 180 tablet 0  . VENTOLIN HFA 108 (90 Base) MCG/ACT inhaler INHALE 2 PUFFS INTO THE LUNGS EVERY 6 HOURS AS NEEDED FOR SHORTNESS OF BREATH (Patient taking differently: Inhale 2 puffs into the lungs every 6 (six) hours as needed (shortness of breath.). ) 18 g 5  . zonisamide (ZONEGRAN) 50 MG capsule Take 150 mg by mouth daily.  2   No current facility-administered medications for this visit.     Allergies as of 03/07/2018 - Review Complete 03/07/2018  Allergen Reaction Noted  . Benadryl [diphenhydramine] Anaphylaxis 03/25/2014  . Latex Itching and Other (See Comments) 11/30/2011  . Penicillins Other (See Comments)     Family History  Problem Relation Age of Onset  . Diabetes Father   . Liver disease Father        liver transplant at [facility], age 30  . Lung cancer Mother 49  . Heart disease Maternal Grandmother   . Parkinson's disease Maternal Grandfather   . Multiple sclerosis Sister 62  . Depression Sister 77  . Alcohol abuse Sister   . Bipolar disorder Sister   . Schizophrenia Sister   . Colon cancer Neg Hx     Social History   Socioeconomic History  .  Marital status: Significant Other    Spouse name: Not on  file  . Number of children: 0  . Years of education: 9  . Highest education level: 9th grade  Occupational History  . Occupation: disability    Employer: UNEMPLOYED  Social Needs  . Financial resource strain: Not very hard  . Food insecurity:    Worry: Sometimes true    Inability: Sometimes true  . Transportation needs:    Medical: No    Non-medical: No  Tobacco Use  . Smoking status: Current Every Day Smoker    Packs/day: 0.10    Years: 15.00    Pack years: 1.50    Types: Cigarettes  . Smokeless tobacco: Never Used  . Tobacco comment: 2 per day  Substance and Sexual Activity  . Alcohol use: No    Alcohol/week: 0.0 standard drinks  . Drug use: Yes    Types: Marijuana  . Sexual activity: Yes    Birth control/protection: None  Lifestyle  . Physical activity:    Days per week: 3 days    Minutes per session: 30 min  . Stress: Not at all  Relationships  . Social connections:    Talks on phone: More than three times a week    Gets together: Twice a week    Attends religious service: Never    Active member of club or organization: No    Attends meetings of clubs or organizations: Never    Relationship status: Living with partner  Other Topics Concern  . Not on file  Social History Narrative  . Not on file    Review of Systems: Difficult to obtain due to patient's discomfort. See HPI.   Physical Exam: BP 119/83   Pulse 68   Temp 98 F (36.7 C)   Ht 5\' 6"  (1.676 m)   Wt 149 lb (67.6 kg)   BMI 24.05 kg/m  General:   Alert and oriented. Initially without distress but progressed during conversation to moaning, tearful, anxious Head:  Normocephalic and atraumatic. Eyes:  Conjuctiva clear without scleral icterus. Mouth:  Oral mucosa pink and moist.  Lungs: clear bilaterally. Patient states stethoscope is painful on her back Cardiac: S1 S2 present, regular Abdomen:  +BS, distended but soft, TTP diffusely but  more so epigastric region, no rebound or guarding. States "cold hands improve pain" Msk:  Symmetrical without gross deformities. Normal posture. Extremities:  Without edema. Neurologic:  Alert and  oriented x4 Psych:  Anxious

## 2018-03-07 NOTE — Assessment & Plan Note (Signed)
38 year old female with acute on chronic abdominal pain, worsening over past 3 weeks. Currently continues to take diclofenac and recently started Protonix. Nausea persistent but vomiting resolved; she has also noted early satiety, pain worsening with eating. Reports of hematemesis when symptoms first started. Heme negative in ED. She has had 2 prior presentations to ED recently; it is interesting that her initial presentation showed leukocytosis with WBC count in 25 range but then resolved to normal several days later. CT completed recently, which was unrevealing. She also had an unremarkable xray in the ED.  Patient is not well known to me, but she did not appear in significant distress at start of visit. Vitals are stable with BP 119/83, her pulse is 68, and she is afebrile. Through course of visit, she became increasingly anxious and uncomfortable, moaning, difficulty in getting on exam table, clutching her abdomen. Physical exam with low concern for peritoneal signs: her abdomen is mildly distended but soft, good bowel sounds, and she notes small amount of improvement with palpation due to my "cold hands". Also noting marginal improvement at home with heating pads.   History is notable for pyloric channel spasms and requiring Botox historically. In setting of recent NSAID use, would be concerned for gastritis, PUD, doubt dealing with perforation, but she is reporting worsening pain since last seen at ED. Discussed stat labs and AAS as outpatient, but she desires to present to the ED, which is not unreasonable. Once stabilized, would benefit from an EGD with Propofol in near future, as last was 3 years ago. I have also asked her to stop diclofenac, increase PPI to BID. Further recommendations after assessed in ED. I have notified charge RN as well.

## 2018-03-07 NOTE — ED Triage Notes (Signed)
Pt reports that she has gastroparesis and has been having abdominal pain for 3 weeks. Pt went to GI today and was told to come to ED due to increase pain

## 2018-03-08 DIAGNOSIS — F329 Major depressive disorder, single episode, unspecified: Secondary | ICD-10-CM | POA: Diagnosis not present

## 2018-03-08 DIAGNOSIS — J45991 Cough variant asthma: Secondary | ICD-10-CM | POA: Diagnosis not present

## 2018-03-08 DIAGNOSIS — R109 Unspecified abdominal pain: Secondary | ICD-10-CM | POA: Diagnosis not present

## 2018-03-08 DIAGNOSIS — K449 Diaphragmatic hernia without obstruction or gangrene: Secondary | ICD-10-CM | POA: Diagnosis not present

## 2018-03-08 DIAGNOSIS — K5900 Constipation, unspecified: Secondary | ICD-10-CM | POA: Diagnosis not present

## 2018-03-08 LAB — CBC WITH DIFFERENTIAL/PLATELET
ABS IMMATURE GRANULOCYTES: 0.01 10*3/uL (ref 0.00–0.07)
Basophils Absolute: 0 10*3/uL (ref 0.0–0.1)
Basophils Relative: 0 %
Eosinophils Absolute: 0.2 10*3/uL (ref 0.0–0.5)
Eosinophils Relative: 3 %
HEMATOCRIT: 38.2 % (ref 36.0–46.0)
Hemoglobin: 11.8 g/dL — ABNORMAL LOW (ref 12.0–15.0)
Immature Granulocytes: 0 %
LYMPHS PCT: 26 %
Lymphs Abs: 1.6 10*3/uL (ref 0.7–4.0)
MCH: 29.8 pg (ref 26.0–34.0)
MCHC: 30.9 g/dL (ref 30.0–36.0)
MCV: 96.5 fL (ref 80.0–100.0)
Monocytes Absolute: 0.5 10*3/uL (ref 0.1–1.0)
Monocytes Relative: 9 %
Neutro Abs: 3.7 10*3/uL (ref 1.7–7.7)
Neutrophils Relative %: 62 %
Platelets: 147 10*3/uL — ABNORMAL LOW (ref 150–400)
RBC: 3.96 MIL/uL (ref 3.87–5.11)
RDW: 13.8 % (ref 11.5–15.5)
WBC: 6 10*3/uL (ref 4.0–10.5)
nRBC: 0 % (ref 0.0–0.2)

## 2018-03-08 LAB — BASIC METABOLIC PANEL
Anion gap: 8 (ref 5–15)
BUN: 19 mg/dL (ref 6–20)
CHLORIDE: 108 mmol/L (ref 98–111)
CO2: 25 mmol/L (ref 22–32)
Calcium: 9.5 mg/dL (ref 8.9–10.3)
Creatinine, Ser: 0.65 mg/dL (ref 0.44–1.00)
GFR calc Af Amer: >60 mL/min (ref >60)
GFR calc non Af Amer: >60 mL/min (ref >60)
Glucose, Bld: 107 mg/dL — ABNORMAL HIGH (ref 70–99)
Potassium: 3.8 mmol/L (ref 3.5–5.1)
Sodium: 141 mmol/L (ref 135–145)

## 2018-03-08 LAB — RAPID URINE DRUG SCREEN, HOSP PERFORMED
AMPHETAMINES: NOT DETECTED
Barbiturates: NOT DETECTED
Benzodiazepines: NOT DETECTED
Cocaine: NOT DETECTED
Opiates: POSITIVE — AB
TETRAHYDROCANNABINOL: POSITIVE — AB

## 2018-03-08 MED ORDER — POLYETHYLENE GLYCOL 3350 17 G PO PACK
17.0000 g | PACK | Freq: Two times a day (BID) | ORAL | Status: DC
  Administered 2018-03-08 – 2018-03-10 (×4): 17 g via ORAL
  Filled 2018-03-08 (×6): qty 1

## 2018-03-08 MED ORDER — HYDROMORPHONE HCL 1 MG/ML IJ SOLN
0.5000 mg | INTRAMUSCULAR | Status: DC | PRN
  Administered 2018-03-09 – 2018-03-10 (×6): 0.5 mg via INTRAVENOUS
  Filled 2018-03-08 (×6): qty 0.5

## 2018-03-08 MED ORDER — PANTOPRAZOLE SODIUM 40 MG PO TBEC
40.0000 mg | DELAYED_RELEASE_TABLET | Freq: Two times a day (BID) | ORAL | Status: DC
  Administered 2018-03-08 – 2018-03-11 (×6): 40 mg via ORAL
  Filled 2018-03-08 (×6): qty 1

## 2018-03-08 MED ORDER — HYDROCODONE-ACETAMINOPHEN 5-325 MG PO TABS
1.0000 | ORAL_TABLET | ORAL | Status: DC | PRN
  Administered 2018-03-08 – 2018-03-11 (×3): 1 via ORAL
  Filled 2018-03-08 (×3): qty 1

## 2018-03-08 NOTE — ED Notes (Signed)
Pharmacy states that zonisamide 50 mg capsules are not available. Talked with patient she is having someone bring in her home medication so that she can take it at bedtime.

## 2018-03-08 NOTE — Care Management Obs Status (Signed)
[place] NOTIFICATION   Patient Details  Name: Destani Wamser Tomeo MRN: [id number] Date of Birth: 1980-05-04   Medicare Observation Status Notification Given:  Yes    Sherald Barge, RN 03/08/2018, 1:13 PM

## 2018-03-08 NOTE — Progress Notes (Signed)
Subjective: Nausea improved. Still with abdominal pain but not worsened from admission. Drinking Ensure. Soaps suds enema upcoming.   Objective: Vital signs in last 24 hours: Temp:  [97.8 F (36.6 C)] 97.8 F (36.6 C) (02/17 1439) Pulse Rate:  [44-63] 48 (02/18 1000) Resp:  [12-21] 15 (02/18 0900) BP: (97-135)/(62-83) 108/76 (02/18 1000) SpO2:  [97 %-100 %] 100 % (02/18 1000) Weight:  [67.6 kg] 67.6 kg (02/17 1440)   General:   Alert and oriented, pleasant, no distress, much improved from yesterday. Abdomen:  Bowel sounds present, soft, mild TTP upper abdomen, no rebound or guarding Msk:  Symmetrical without gross deformities. Normal posture. Extremities:  Without  edema. Neurologic:  Alert and  oriented x4 Psych:  Alert and cooperative. Normal mood and affect.  Intake/Output from previous day: No intake/output data recorded. Intake/Output this shift: Total I/O In: 1005 [I.V.:1005] Out: -   Lab Results: Recent Labs    03/07/18 1927 03/08/18 0607  WBC 9.9 6.0  HGB 11.9* 11.8*  HCT 38.1 38.2  PLT 184 147*   BMET Recent Labs    03/07/18 1927 03/08/18 0607  NA 140 141  K 3.8 3.8  CL 108 108  CO2 25 25  GLUCOSE 85 107*  BUN 21* 19  CREATININE 0.85 0.65  CALCIUM 9.7 9.5   LFT Recent Labs    03/07/18 1927  PROT 7.7  ALBUMIN 4.7  AST 28  ALT 36  ALKPHOS 60  BILITOT 0.1*     Studies/Results: Ct Abdomen Pelvis W Contrast  Result Date: 03/07/2018 CLINICAL DATA:  38 year old female with abdominal pain for 3 weeks. History of gastroparesis. EXAM: CT ABDOMEN AND PELVIS WITH CONTRAST TECHNIQUE: Multidetector CT imaging of the abdomen and pelvis was performed using the standard protocol following bolus administration of intravenous contrast. CONTRAST:  171mL OMNIPAQUE IOHEXOL 300 MG/ML  SOLN COMPARISON:  CT Abdomen and Pelvis 02/16/2018 and earlier. FINDINGS: Lower chest: Minor lung base atelectasis. No cardiomegaly, pericardial effusion, pleural effusion.  Hepatobiliary: Surgically absent gallbladder. Negative liver. Pancreas: Negative. Spleen: Negative. Adrenals/Urinary Tract: Normal adrenal glands. Bilateral renal enhancement and contrast excretion is symmetric and within normal limits. Proximal ureters are decompressed. No perinephric stranding. Unremarkable urinary bladder. Stomach/Bowel: Redundant large bowel with increased retained stool compared to last month, but no large bowel inflammation. There is retained oral contrast in the appendix which remains normal (series 2, image 49). Negative terminal ileum. No dilated small bowel. The stomach is fairly decompressed. The duodenum is stable and within normal limits. No free air, free fluid. Vascular/Lymphatic: Major arterial structures appear patent. There is mild soft atherosclerotic plaque in the distal aorta. Portal venous system is patent. No lymphadenopathy. Reproductive: Surgically absent uterus. Diminutive or absent ovaries. Other: No pelvic free fluid. Musculoskeletal: Negative. IMPRESSION: Increased stool in redundant large bowel since the CT last month, otherwise negative. Electronically Signed   By: Genevie Ann M.D.   On: 03/07/2018 22:49    Assessment: 38 year old female with acute on chronic abdominal pain worsening over past several weeks, associated nausea and vomiting, early satiety, worsened postprandially, seen in the office yesterday with plans to manage as outpatient with stat imaging and labs; however, she elected to present to the ED as she felt pain was worsening. See office visit full note from 03/07/2018 when seen in the outpatient setting. History notable for pyloric channel spasms requiring Botox historically, with last EGD in Oct 2017. CT abd/pelvis with contrast on file from 02/16/2018 and yesterday 03/07/2018 without acute findings. However,  she does have increased stool in colon.   Abdominal pain multifactorial in this setting, with constipation playing a role. She did note onset of  symptoms with starting diclofenac as outpatient without PPI. Much improved symptomatically today than yesterday in the office. Will focus on aggressive bowel regimen and would pursue EGD with Propofol if persistent epigastric pain, nausea/vomting despite bowel regimen.   Plan: Increase PPI to BID Linzess 290 mcg daily Agree with added Miralax BID Was on Bentyl as outpatient: hold this in setting of constipation Continue with clear liquids NPO after midnight and reassess Anticipate possible EGD with Propofol if unable to advance diet   Annitta Needs, PhD, ANP-BC [facility]        LOS: 0 days    03/08/2018, 1:52 PM

## 2018-03-08 NOTE — Progress Notes (Signed)
CC'ED TO PCP 

## 2018-03-08 NOTE — ED Notes (Signed)
ED TO INPATIENT HANDOFF REPORT  Name/Age/Gender Tracey Daniel 38 y.o. female  Code Status    Code Status Orders  (From admission, onward)         Start     Ordered   03/07/18 2346  Full code  Continuous     03/07/18 2346        Code Status History    Date Active Date Inactive Code Status Order ID Comments User Context   02/27/2014 0008 02/28/2014 2023 Full Code [id number]  Berle Mull, MD ED   02/08/2013 1732 02/10/2013 1055 Full Code [id number]  Donne Hazel, MD ED   03/26/2011 0130 03/31/2011 1710 Full Code [id number]  Arna Medici, RN Inpatient   12/24/2010 1413 01/02/2011 1327 Full Code [id number]  Kathie Dike, MD Inpatient      Home/SNF/Other Home  Chief Complaint Abdominal Pain  Level of Care/Admitting Diagnosis ED Disposition    ED Disposition Condition [place]: [facility] [[id number]]  Level of Care: Med-Surg [16]  Diagnosis: Intractable abdominal pain [073710]  Admitting Physician: Vianne Bulls [[id number]]  Attending Physician: Vianne Bulls [[id number]]  PT Class (Do Not Modify): Observation [104]  PT Acc Code (Do Not Modify): Observation [10022]       Medical History Past Medical History:  Diagnosis Date  . Anemia of other chronic disease 11/29/2012  . Asthma   . Back pain, chronic   . Chronic abdominal pain   . Constipation   . COPD (chronic obstructive pulmonary disease) ([place])   . Cyst of skin    mid spine area  . Depression 2000   h/o suicidal ideation  . Gastric outlet obstruction   . Gastritis   . Gastroparesis   . Migraine   . Migraines   . Nausea and vomiting    recurrent  . Nicotine addiction   . Osteoporosis   . Peptic ulcer disease   . Pyloric spasm 03/30/2011  . Seasonal allergies   . Sinusitis   . Substance abuse ([place]) 2008   marijuana  . Vitamin D deficiency     Allergies Allergies  Allergen Reactions  . Benadryl [Diphenhydramine] Anaphylaxis  . Latex Itching and Other (See  Comments)    "burning" where the tape goes  . Penicillins Other (See Comments)    Unknown Has patient had a PCN reaction causing immediate rash, facial/tongue/throat swelling, SOB or lightheadedness with hypotension Yes Has patient had a PCN reaction causing severe rash involving mucus membranes or skin necrosis: Yes-hives  Has patient had a PCN reaction that required hospitalization Yes Has patient had a PCN reaction occurring within the last 10 years: Yes If all of the above answers are "NO", then may proceed with Cephalosporin use.     IV Location/Drains/Wounds Patient Lines/Drains/Airways Status   Active Line/Drains/Airways    Name:   Placement date:   Placement time:   Site:   Days:   Peripheral IV 03/07/18 Left Antecubital   03/07/18    1921    Antecubital   1          Labs/Imaging Results for orders placed or performed during the hospital encounter of 03/07/18 (from the past 48 hour(s))  Urinalysis, Routine w reflex microscopic     Status: Abnormal   Collection Time: 03/07/18  6:55 PM  Result Value Ref Range   Color, Urine YELLOW YELLOW   APPearance CLOUDY (A) CLEAR   Specific Gravity, Urine 1.015 1.005 - 1.030  pH 7.0 5.0 - 8.0   Glucose, UA NEGATIVE NEGATIVE mg/dL   Hgb urine dipstick NEGATIVE NEGATIVE   Bilirubin Urine NEGATIVE NEGATIVE   Ketones, ur NEGATIVE NEGATIVE mg/dL   Protein, ur NEGATIVE NEGATIVE mg/dL   Nitrite NEGATIVE NEGATIVE   Leukocytes,Ua NEGATIVE NEGATIVE    Comment: Performed at [facility], [address], [place], [place] [address]  Pregnancy, urine     Status: None   Collection Time: 03/07/18  6:55 PM  Result Value Ref Range   Preg Test, Ur NEGATIVE NEGATIVE    Comment:        THE SENSITIVITY OF THIS METHODOLOGY IS >20 mIU/mL. Performed at [facility], [address], [place], [place] [address]   CBC with Differential     Status: Abnormal   Collection Time: 03/07/18  7:27 PM  Result Value Ref Range   WBC 9.9 4.0 - 10.5 K/uL   RBC  4.00 3.87 - 5.11 MIL/uL   Hemoglobin 11.9 (L) 12.0 - 15.0 g/dL   HCT 38.1 36.0 - 46.0 %   MCV 95.3 80.0 - 100.0 fL   MCH 29.8 26.0 - 34.0 pg   MCHC 31.2 30.0 - 36.0 g/dL   RDW 13.6 11.5 - 15.5 %   Platelets 184 150 - 400 K/uL   nRBC 0.0 0.0 - 0.2 %   Neutrophils Relative % 58 %   Neutro Abs 5.7 1.7 - 7.7 K/uL   Lymphocytes Relative 34 %   Lymphs Abs 3.3 0.7 - 4.0 K/uL   Monocytes Relative 6 %   Monocytes Absolute 0.6 0.1 - 1.0 K/uL   Eosinophils Relative 2 %   Eosinophils Absolute 0.2 0.0 - 0.5 K/uL   Basophils Relative 0 %   Basophils Absolute 0.0 0.0 - 0.1 K/uL   Immature Granulocytes 0 %   Abs Immature Granulocytes 0.02 0.00 - 0.07 K/uL    Comment: Performed at [facility], [address], [place], [place] [address]  Comprehensive metabolic panel     Status: Abnormal   Collection Time: 03/07/18  7:27 PM  Result Value Ref Range   Sodium 140 135 - 145 mmol/L   Potassium 3.8 3.5 - 5.1 mmol/L   Chloride 108 98 - 111 mmol/L   CO2 25 22 - 32 mmol/L   Glucose, Bld 85 70 - 99 mg/dL   BUN 21 (H) 6 - 20 mg/dL   Creatinine, Ser 0.85 0.44 - 1.00 mg/dL   Calcium 9.7 8.9 - 10.3 mg/dL   Total Protein 7.7 6.5 - 8.1 g/dL   Albumin 4.7 3.5 - 5.0 g/dL   AST 28 15 - 41 U/L   ALT 36 0 - 44 U/L   Alkaline Phosphatase 60 38 - 126 U/L   Total Bilirubin 0.1 (L) 0.3 - 1.2 mg/dL   GFR calc non Af Amer >60 >60 mL/min   GFR calc Af Amer >60 >60 mL/min   Anion gap 7 5 - 15    Comment: Performed at [facility], [address], [place], [place] [address]  Lipase, blood     Status: None   Collection Time: 03/07/18  7:27 PM  Result Value Ref Range   Lipase 28 11 - 51 U/L    Comment: Performed at [facility], [address], [place],  [address]  Troponin I - Once     Status: None   Collection Time: 03/07/18  7:27 PM  Result Value Ref Range   Troponin I <0.03 <0.03 ng/mL    Comment:  Performed at [facility], [address], [place], [place] [address]  Basic metabolic panel     Status:  Abnormal   Collection Time: 03/08/18  6:07 AM  Result Value Ref Range   Sodium 141 135 - 145 mmol/L   Potassium 3.8 3.5 - 5.1 mmol/L   Chloride 108 98 - 111 mmol/L   CO2 25 22 - 32 mmol/L   Glucose, Bld 107 (H) 70 - 99 mg/dL   BUN 19 6 - 20 mg/dL   Creatinine, Ser 0.65 0.44 - 1.00 mg/dL   Calcium 9.5 8.9 - 10.3 mg/dL   GFR calc non Af Amer >60 >60 mL/min   GFR calc Af Amer >60 >60 mL/min   Anion gap 8 5 - 15    Comment: Performed at [facility], [address] [place], [place] [address]  CBC WITH DIFFERENTIAL     Status: Abnormal   Collection Time: 03/08/18  6:07 AM  Result Value Ref Range   WBC 6.0 4.0 - 10.5 K/uL   RBC 3.96 3.87 - 5.11 MIL/uL   Hemoglobin 11.8 (L) 12.0 - 15.0 g/dL   HCT 38.2 36.0 - 46.0 %   MCV 96.5 80.0 - 100.0 fL   MCH 29.8 26.0 - 34.0 pg   MCHC 30.9 30.0 - 36.0 g/dL   RDW 13.8 11.5 - 15.5 %   Platelets 147 (L) 150 - 400 K/uL   nRBC 0.0 0.0 - 0.2 %   Neutrophils Relative % 62 %   Neutro Abs 3.7 1.7 - 7.7 K/uL   Lymphocytes Relative 26 %   Lymphs Abs 1.6 0.7 - 4.0 K/uL   Monocytes Relative 9 %   Monocytes Absolute 0.5 0.1 - 1.0 K/uL   Eosinophils Relative 3 %   Eosinophils Absolute 0.2 0.0 - 0.5 K/uL   Basophils Relative 0 %   Basophils Absolute 0.0 0.0 - 0.1 K/uL   Immature Granulocytes 0 %   Abs Immature Granulocytes 0.01 0.00 - 0.07 K/uL    Comment: Performed at [facility], [address] [place], [place] [address]  Urine rapid drug screen (hosp performed)     Status: Abnormal   Collection Time: 03/08/18  9:13 AM  Result Value Ref Range   Opiates POSITIVE (A) NONE DETECTED   Cocaine NONE DETECTED NONE DETECTED   Benzodiazepines NONE DETECTED NONE DETECTED   Amphetamines NONE DETECTED NONE DETECTED   Tetrahydrocannabinol POSITIVE (A) NONE DETECTED   Barbiturates NONE DETECTED NONE DETECTED    Comment: (NOTE) DRUG SCREEN FOR MEDICAL PURPOSES ONLY.  IF CONFIRMATION IS NEEDED FOR ANY PURPOSE, NOTIFY LAB WITHIN 5 DAYS. LOWEST DETECTABLE  LIMITS FOR URINE DRUG SCREEN Drug Class                     Cutoff (ng/mL) Amphetamine and metabolites    1000 Barbiturate and metabolites    200 Benzodiazepine                 283 Tricyclics and metabolites     300 Opiates and metabolites        300 Cocaine and metabolites        300 THC                            50 Performed at [facility], [address], [place],  [address]    Ct Abdomen Pelvis W Contrast  Result Date: 03/07/2018 CLINICAL DATA:  38 year old female with  abdominal pain for 3 weeks. History of gastroparesis. EXAM: CT ABDOMEN AND PELVIS WITH CONTRAST TECHNIQUE: Multidetector CT imaging of the abdomen and pelvis was performed using the standard protocol following bolus administration of intravenous contrast. CONTRAST:  178mL OMNIPAQUE IOHEXOL 300 MG/ML  SOLN COMPARISON:  CT Abdomen and Pelvis 02/16/2018 and earlier. FINDINGS: Lower chest: Minor lung base atelectasis. No cardiomegaly, pericardial effusion, pleural effusion. Hepatobiliary: Surgically absent gallbladder. Negative liver. Pancreas: Negative. Spleen: Negative. Adrenals/Urinary Tract: Normal adrenal glands. Bilateral renal enhancement and contrast excretion is symmetric and within normal limits. Proximal ureters are decompressed. No perinephric stranding. Unremarkable urinary bladder. Stomach/Bowel: Redundant large bowel with increased retained stool compared to last month, but no large bowel inflammation. There is retained oral contrast in the appendix which remains normal (series 2, image 49). Negative terminal ileum. No dilated small bowel. The stomach is fairly decompressed. The duodenum is stable and within normal limits. No free air, free fluid. Vascular/Lymphatic: Major arterial structures appear patent. There is mild soft atherosclerotic plaque in the distal aorta. Portal venous system is patent. No lymphadenopathy. Reproductive: Surgically absent uterus. Diminutive or absent ovaries. Other: No pelvic free  fluid. Musculoskeletal: Negative. IMPRESSION: Increased stool in redundant large bowel since the CT last month, otherwise negative. Electronically Signed   By: Genevie Ann M.D.   On: 03/07/2018 22:49    Pending Labs Unresulted Labs (From admission, onward)    Start     Ordered   03/14/18 0500  Creatinine, serum  (enoxaparin (LOVENOX)    CrCl >/= 30 ml/min)  Weekly,   R    Comments:  while on enoxaparin therapy    03/07/18 2346   03/08/18 0500  HIV antibody (Routine Testing)  Tomorrow morning,   R     03/07/18 2346          Vitals/Pain Today's Vitals   03/08/18 0800 03/08/18 0900 03/08/18 0943 03/08/18 1000  BP: 103/67 120/80 97/82 108/76  Pulse: (!) 47 (!) 51 (!) 54 (!) 48  Resp: 15 15    Temp:      TempSrc:      SpO2: 100% 100%  100%  Weight:      Height:      PainSc:        Isolation Precautions No active isolations  Medications Medications  pravastatin (PRAVACHOL) tablet 20 mg (has no administration in time range)  propranolol (INDERAL) tablet 10 mg (0 mg Oral Hold 03/08/18 0943)  risperiDONE (RISPERDAL) tablet 0.5 mg (0.5 mg Oral Not Given 03/08/18 0410)  sertraline (ZOLOFT) tablet 50 mg (50 mg Oral Not Given 03/08/18 0411)  linaclotide (LINZESS) capsule 290 mcg (290 mcg Oral Given 03/08/18 0943)  pantoprazole (PROTONIX) EC tablet 40 mg (40 mg Oral Given 03/08/18 0945)  sucralfate (CARAFATE) tablet 1 g (has no administration in time range)  gabapentin (NEURONTIN) capsule 200 mg (200 mg Oral Given 03/08/18 0943)  tiZANidine (ZANAFLEX) tablet 4 mg (has no administration in time range)  zonisamide (ZONEGRAN) capsule 150 mg (150 mg Oral Not Given 03/08/18 0411)  feeding supplement (ENSURE ENLIVE) (ENSURE ENLIVE) liquid 237 mL (237 mLs Oral Given 03/08/18 1200)  potassium chloride SA (K-DUR,KLOR-CON) CR tablet 20 mEq (20 mEq Oral Given 03/08/18 0945)  mometasone-formoterol (DULERA) 100-5 MCG/ACT inhaler 2 puff (2 puffs Inhalation Not Given 03/08/18 0845)  albuterol (PROVENTIL) (2.5  MG/3ML) 0.083% nebulizer solution 3 mL (has no administration in time range)  metoCLOPramide (REGLAN) injection 10 mg (10 mg Intravenous Given 03/08/18 0602)  enoxaparin (LOVENOX) injection 40 mg (  40 mg Subcutaneous Given 03/08/18 0946)  0.9 %  sodium chloride infusion ( Intravenous Stopped 03/08/18 1210)  acetaminophen (TYLENOL) tablet 650 mg (has no administration in time range)    Or  acetaminophen (TYLENOL) suppository 650 mg (has no administration in time range)  polyethylene glycol (MIRALAX / GLYCOLAX) packet 17 g (has no administration in time range)  bisacodyl (DULCOLAX) EC tablet 5 mg (has no administration in time range)  ondansetron (ZOFRAN) tablet 4 mg ( Oral See Alternative 03/08/18 1058)    Or  ondansetron (ZOFRAN) injection 4 mg (4 mg Intravenous Given 03/08/18 1058)  HYDROmorphone (DILAUDID) injection 0.5 mg (has no administration in time range)  HYDROcodone-acetaminophen (NORCO/VICODIN) 5-325 MG per tablet 1 tablet (has no administration in time range)  polyethylene glycol (MIRALAX / GLYCOLAX) packet 17 g (has no administration in time range)  morphine 4 MG/ML injection 4 mg (4 mg Intravenous Given 03/07/18 1932)  pantoprazole (PROTONIX) injection 40 mg (40 mg Intravenous Given 03/07/18 1933)  ondansetron (ZOFRAN) injection 4 mg (4 mg Intravenous Given 03/07/18 1954)  HYDROmorphone (DILAUDID) injection 1 mg (1 mg Intravenous Given 03/07/18 2050)  iohexol (OMNIPAQUE) 300 MG/ML solution 100 mL (100 mLs Intravenous Contrast Given 03/07/18 2215)    Mobility walks

## 2018-03-08 NOTE — ED Notes (Signed)
2nd urine sample sent to lab.

## 2018-03-08 NOTE — Progress Notes (Signed)
PROGRESS NOTE    Tracey Daniel   ZOX:[id number]  DOB: [birth year]  DOA: 03/07/2018 PCP: [name], MD   Brief Narrative:  Tracey Daniel is a 38 y.o. female with medical history significant for pyloric spasms with functional gastric outlet obstruction and chronic abdominal pain, chronic back pain, depression, and asthma who presents for ongiong upper and mid abdominal pain and is actually sent to the ED by the GI office. A CT scan in the ED shows increased stool burden.    Subjective: On and off abdominal pain. Mild nausea. No vomiting or diarrhea.     Assessment & Plan:   Principal Problem:   Intractable abdominal pain - ? Due to constipation- start Miralax BID  and give a soap suds enema. - cont Linzess - cont pain control - will also call GI  Active Problems:   Depression -cont Zoloft and Risperdal     Asthma, cough variant - cont Dulera and Albuterol  Time spent in minutes: 35 DVT prophylaxis: Lovenox Code Status: Full code Family Communication:  Disposition Plan: to be determined Consultants:   GI Procedures:   none Antimicrobials:  Anti-infectives (From admission, onward)   None       Objective: Vitals:   03/08/18 0800 03/08/18 0900 03/08/18 0943 03/08/18 1000  BP: 103/67 120/80 97/82 108/76  Pulse: (!) 47 (!) 51 (!) 54 (!) 48  Resp: 15 15    Temp:      TempSrc:      SpO2: 100% 100%  100%  Weight:      Height:        Intake/Output Summary (Last 24 hours) at 03/08/2018 1323 Last data filed at 03/08/2018 1210 Gross per 24 hour  Intake 1005 ml  Output -  Net 1005 ml   Filed Weights   03/07/18 1440  Weight: 67.6 kg    Examination: General exam: Appears uncomfortable due to pain HEENT: PERRLA, oral mucosa moist, no sclera icterus or thrush Respiratory system: Clear to auscultation. Respiratory effort normal. Cardiovascular system: S1 & S2 heard, RRR.   Gastrointestinal system: Abdomen soft,  Tender in epigastrium and mid  abdomen, nondistended. Normal bowel sounds. Central nervous system: Alert and oriented. No focal neurological deficits. Extremities: No cyanosis, clubbing or edema Skin: No rashes or ulcers Psychiatry:  Mood & affect appropriate.     Data Reviewed: I have personally reviewed following labs and imaging studies  CBC: Recent Labs  Lab 03/07/18 1927 03/08/18 0607  WBC 9.9 6.0  NEUTROABS 5.7 3.7  HGB 11.9* 11.8*  HCT 38.1 38.2  MCV 95.3 96.5  PLT 184 811*   Basic Metabolic Panel: Recent Labs  Lab 03/07/18 1927 03/08/18 0607  NA 140 141  K 3.8 3.8  CL 108 108  CO2 25 25  GLUCOSE 85 107*  BUN 21* 19  CREATININE 0.85 0.65  CALCIUM 9.7 9.5   GFR: Estimated Creatinine Clearance: 87.5 mL/min (by C-G formula based on SCr of 0.65 mg/dL). Liver Function Tests: Recent Labs  Lab 03/07/18 1927  AST 28  ALT 36  ALKPHOS 60  BILITOT 0.1*  PROT 7.7  ALBUMIN 4.7   Recent Labs  Lab 03/07/18 1927  LIPASE 28   No results for input(s): AMMONIA in the last 168 hours. Coagulation Profile: No results for input(s): INR, PROTIME in the last 168 hours. Cardiac Enzymes: Recent Labs  Lab 03/07/18 1927  TROPONINI <0.03   BNP (last 3 results) No results for input(s): PROBNP in the last 8760  hours. HbA1C: No results for input(s): HGBA1C in the last 72 hours. CBG: No results for input(s): GLUCAP in the last 168 hours. Lipid Profile: No results for input(s): CHOL, HDL, LDLCALC, TRIG, CHOLHDL, LDLDIRECT in the last 72 hours. Thyroid Function Tests: No results for input(s): TSH, T4TOTAL, FREET4, T3FREE, THYROIDAB in the last 72 hours. Anemia Panel: No results for input(s): VITAMINB12, FOLATE, FERRITIN, TIBC, IRON, RETICCTPCT in the last 72 hours. Urine analysis:    Component Value Date/Time   COLORURINE YELLOW 03/07/2018 1855   APPEARANCEUR CLOUDY (A) 03/07/2018 1855   LABSPEC 1.015 03/07/2018 1855   PHURINE 7.0 03/07/2018 1855   GLUCOSEU NEGATIVE 03/07/2018 1855   HGBUR  NEGATIVE 03/07/2018 1855   HGBUR trace-intact 11/21/2008 1448   BILIRUBINUR NEGATIVE 03/07/2018 1855   BILIRUBINUR neg 07/31/2014 0941   KETONESUR NEGATIVE 03/07/2018 1855   PROTEINUR NEGATIVE 03/07/2018 1855   UROBILINOGEN 0.2 11/08/2014 1730   NITRITE NEGATIVE 03/07/2018 1855   LEUKOCYTESUR NEGATIVE 03/07/2018 1855   Sepsis Labs: @LABRCNTIP (procalcitonin:4,lacticidven:4) )No results found for this or any previous visit (from the past 240 hour(s)).       Radiology Studies: Ct Abdomen Pelvis W Contrast  Result Date: 03/07/2018 CLINICAL DATA:  38 year old female with abdominal pain for 3 weeks. History of gastroparesis. EXAM: CT ABDOMEN AND PELVIS WITH CONTRAST TECHNIQUE: Multidetector CT imaging of the abdomen and pelvis was performed using the standard protocol following bolus administration of intravenous contrast. CONTRAST:  190mL OMNIPAQUE IOHEXOL 300 MG/ML  SOLN COMPARISON:  CT Abdomen and Pelvis 02/16/2018 and earlier. FINDINGS: Lower chest: Minor lung base atelectasis. No cardiomegaly, pericardial effusion, pleural effusion. Hepatobiliary: Surgically absent gallbladder. Negative liver. Pancreas: Negative. Spleen: Negative. Adrenals/Urinary Tract: Normal adrenal glands. Bilateral renal enhancement and contrast excretion is symmetric and within normal limits. Proximal ureters are decompressed. No perinephric stranding. Unremarkable urinary bladder. Stomach/Bowel: Redundant large bowel with increased retained stool compared to last month, but no large bowel inflammation. There is retained oral contrast in the appendix which remains normal (series 2, image 49). Negative terminal ileum. No dilated small bowel. The stomach is fairly decompressed. The duodenum is stable and within normal limits. No free air, free fluid. Vascular/Lymphatic: Major arterial structures appear patent. There is mild soft atherosclerotic plaque in the distal aorta. Portal venous system is patent. No lymphadenopathy.  Reproductive: Surgically absent uterus. Diminutive or absent ovaries. Other: No pelvic free fluid. Musculoskeletal: Negative. IMPRESSION: Increased stool in redundant large bowel since the CT last month, otherwise negative. Electronically Signed   By: Genevie Ann M.D.   On: 03/07/2018 22:49      Scheduled Meds: . enoxaparin (LOVENOX) injection  40 mg Subcutaneous Q24H  . feeding supplement (ENSURE ENLIVE)  237 mL Oral TID WC  . gabapentin  200 mg Oral TID  . linaclotide  290 mcg Oral Daily  . metoCLOPramide (REGLAN) injection  10 mg Intravenous Q8H  . mometasone-formoterol  2 puff Inhalation BID  . pantoprazole  40 mg Oral Daily  . potassium chloride SA  20 mEq Oral Daily  . pravastatin  20 mg Oral q1800  . propranolol  10 mg Oral TID  . risperiDONE  0.5 mg Oral QHS  . sertraline  50 mg Oral QHS  . zonisamide  150 mg Oral QHS   Continuous Infusions:   LOS: 0 days      Debbe Odea, MD [facility] Pager: [email or web address] Password [email or web address] 03/08/2018, 1:23 PM

## 2018-03-09 DIAGNOSIS — K449 Diaphragmatic hernia without obstruction or gangrene: Secondary | ICD-10-CM | POA: Diagnosis not present

## 2018-03-09 DIAGNOSIS — R1084 Generalized abdominal pain: Secondary | ICD-10-CM | POA: Diagnosis not present

## 2018-03-09 DIAGNOSIS — R109 Unspecified abdominal pain: Secondary | ICD-10-CM | POA: Diagnosis not present

## 2018-03-09 DIAGNOSIS — K5900 Constipation, unspecified: Secondary | ICD-10-CM | POA: Diagnosis not present

## 2018-03-09 DIAGNOSIS — K3184 Gastroparesis: Secondary | ICD-10-CM | POA: Diagnosis not present

## 2018-03-09 DIAGNOSIS — K313 Pylorospasm, not elsewhere classified: Secondary | ICD-10-CM | POA: Diagnosis not present

## 2018-03-09 LAB — HIV ANTIBODY (ROUTINE TESTING W REFLEX): HIV Screen 4th Generation wRfx: NONREACTIVE

## 2018-03-09 MED ORDER — SODIUM CHLORIDE 0.9 % IV SOLN: INTRAVENOUS | Status: DC

## 2018-03-09 NOTE — Progress Notes (Signed)
PROGRESS NOTE                                                                                                                                                                                                             Patient Demographics:    Tracey Daniel, is a 38 y.o. female, DOB - January 26, 1980, SPQ:[id number]  Admit date - 03/07/2018   Admitting Physician Vianne Bulls, MD  Outpatient Primary MD for the patient is [name], MD  LOS - 0  Outpatient Specialists: GI  Chief Complaint  Patient presents with  . Abdominal Pain       Brief Narrative 38 year old female with history of pelvic spasms, functional gastric outlet obstruction and chronic abdominal pain, depression, chronic back pain, and asthma presented to the GI office with ongoing upper and mid abdominal pain.  She was sent to the ED for further work-up.  CT scan in the ED showed increased stool burden.   Subjective:   Still has epigastric and supraumbilical abdominal pain.  Reports lower abdominal pain to have resolved after having a bowel movement last night.  Denies further nausea.   Assessment  & Plan :    Principal Problem:   Intractable abdominal pain Etiology unclear.  Given high stool burden suspected there could be component of constipation and started twice daily MiraLAX and also received a soapsuds enema.  Had a bowel movement yesterday and lower abdominal discomfort has resolved.  Continue Linzess and Dilaudid as needed for pain scheduled Reglan every 8 hours (transition to PRN if no further nausea during the day). PPI increased to twice daily.  Bentyl held due to constipation. GI following.  Given persistent pain may need EGD to further evaluate.  Has been made n.p.o.  Active Problems:   Depression Continue Zoloft and Risperdal    Asthma, cough variant Continue albuterol and Dulera  Cannabis use/tobacco use Counseled on  cessation.     Code Status : Full code  Family Communication  : None at bedside, lives with her fianc  Disposition Plan  : Home once abdominal pain improved, tolerating diet and further GI evaluation and recommendations  Barriers For Discharge : Active symptoms  Consults  : GI  Procedures  : CT abdomen  DVT Prophylaxis  :  Lovenox -   Lab Results  Component Value Date  PLT 147 (L) 03/08/2018    Antibiotics  :   Anti-infectives (From admission, onward)   None        Objective:   Vitals:   03/08/18 2023 03/08/18 2237 03/09/18 0548 03/09/18 0758  BP:  114/69 117/69   Pulse:  (!) 56 (!) 54   Resp:  20 20   Temp:  98.3 F (36.8 C) 98.4 F (36.9 C)   TempSrc:  Oral Oral   SpO2: 100% 100% 100% 100%  Weight:   67.9 kg   Height:        Wt Readings from Last 3 Encounters:  03/09/18 67.9 kg  03/07/18 67.6 kg  02/21/18 68 kg     Intake/Output Summary (Last 24 hours) at 03/09/2018 0923 Last data filed at 03/09/2018 0700 Gross per 24 hour  Intake 1005 ml  Output 302 ml  Net 703 ml     Physical Exam  Gen: not in distress HEENT:  moist mucosa, supple neck Chest: clear b/l, no added sounds CVS: N S1&S2, no murmurs,  GI: soft,  ND, BS+, minimal epigastric tenderness to pressure Musculoskeletal: warm, no edema     Data Review:    CBC Recent Labs  Lab 03/07/18 1927 03/08/18 0607  WBC 9.9 6.0  HGB 11.9* 11.8*  HCT 38.1 38.2  PLT 184 147*  MCV 95.3 96.5  MCH 29.8 29.8  MCHC 31.2 30.9  RDW 13.6 13.8  LYMPHSABS 3.3 1.6  MONOABS 0.6 0.5  EOSABS 0.2 0.2  BASOSABS 0.0 0.0    Chemistries  Recent Labs  Lab 03/07/18 1927 03/08/18 0607  NA 140 141  K 3.8 3.8  CL 108 108  CO2 25 25  GLUCOSE 85 107*  BUN 21* 19  CREATININE 0.85 0.65  CALCIUM 9.7 9.5  AST 28  --   ALT 36  --   ALKPHOS 60  --   BILITOT 0.1*  --    ------------------------------------------------------------------------------------------------------------------ No results  for input(s): CHOL, HDL, LDLCALC, TRIG, CHOLHDL, LDLDIRECT in the last 72 hours.  Lab Results  Component Value Date   HGBA1C 5.4 12/29/2010   ------------------------------------------------------------------------------------------------------------------ No results for input(s): TSH, T4TOTAL, T3FREE, THYROIDAB in the last 72 hours.  Invalid input(s): FREET3 ------------------------------------------------------------------------------------------------------------------ No results for input(s): VITAMINB12, FOLATE, FERRITIN, TIBC, IRON, RETICCTPCT in the last 72 hours.  Coagulation profile No results for input(s): INR, PROTIME in the last 168 hours.  No results for input(s): DDIMER in the last 72 hours.  Cardiac Enzymes Recent Labs  Lab 03/07/18 1927  TROPONINI <0.03   ------------------------------------------------------------------------------------------------------------------ No results found for: BNP  Inpatient Medications  Scheduled Meds: . enoxaparin (LOVENOX) injection  40 mg Subcutaneous Q24H  . feeding supplement (ENSURE ENLIVE)  237 mL Oral TID WC  . gabapentin  200 mg Oral TID  . linaclotide  290 mcg Oral Daily  . metoCLOPramide (REGLAN) injection  10 mg Intravenous Q8H  . mometasone-formoterol  2 puff Inhalation BID  . pantoprazole  40 mg Oral BID AC  . polyethylene glycol  17 g Oral BID  . potassium chloride SA  20 mEq Oral Daily  . pravastatin  20 mg Oral q1800  . propranolol  10 mg Oral TID  . risperiDONE  0.5 mg Oral QHS  . sertraline  50 mg Oral QHS  . zonisamide  150 mg Oral QHS   Continuous Infusions: PRN Meds:.acetaminophen **OR** acetaminophen, albuterol, bisacodyl, HYDROcodone-acetaminophen, HYDROmorphone (DILAUDID) injection, ondansetron **OR** ondansetron (ZOFRAN) IV, polyethylene glycol, sucralfate, tiZANidine  Micro Results No results found  for this or any previous visit (from the past 240 hour(s)).  Radiology Reports Dg Chest 2  View  Result Date: 02/16/2018 CLINICAL DATA:  Short of breath EXAM: CHEST - 2 VIEW COMPARISON:  02/08/2018 FINDINGS: The heart size and mediastinal contours are within normal limits. Both lungs are clear. The visualized skeletal structures are unremarkable. IMPRESSION: No active cardiopulmonary disease. Electronically Signed   By: Franchot Gallo M.D.   On: 02/16/2018 16:57   Ct Abdomen Pelvis W Contrast  Result Date: 03/07/2018 CLINICAL DATA:  38 year old female with abdominal pain for 3 weeks. History of gastroparesis. EXAM: CT ABDOMEN AND PELVIS WITH CONTRAST TECHNIQUE: Multidetector CT imaging of the abdomen and pelvis was performed using the standard protocol following bolus administration of intravenous contrast. CONTRAST:  198mL OMNIPAQUE IOHEXOL 300 MG/ML  SOLN COMPARISON:  CT Abdomen and Pelvis 02/16/2018 and earlier. FINDINGS: Lower chest: Minor lung base atelectasis. No cardiomegaly, pericardial effusion, pleural effusion. Hepatobiliary: Surgically absent gallbladder. Negative liver. Pancreas: Negative. Spleen: Negative. Adrenals/Urinary Tract: Normal adrenal glands. Bilateral renal enhancement and contrast excretion is symmetric and within normal limits. Proximal ureters are decompressed. No perinephric stranding. Unremarkable urinary bladder. Stomach/Bowel: Redundant large bowel with increased retained stool compared to last month, but no large bowel inflammation. There is retained oral contrast in the appendix which remains normal (series 2, image 49). Negative terminal ileum. No dilated small bowel. The stomach is fairly decompressed. The duodenum is stable and within normal limits. No free air, free fluid. Vascular/Lymphatic: Major arterial structures appear patent. There is mild soft atherosclerotic plaque in the distal aorta. Portal venous system is patent. No lymphadenopathy. Reproductive: Surgically absent uterus. Diminutive or absent ovaries. Other: No pelvic free fluid. Musculoskeletal:  Negative. IMPRESSION: Increased stool in redundant large bowel since the CT last month, otherwise negative. Electronically Signed   By: Genevie Ann M.D.   On: 03/07/2018 22:49   Ct Abdomen Pelvis W Contrast  Result Date: 02/16/2018 CLINICAL DATA:  Acute generalized abdominal pain. EXAM: CT ABDOMEN AND PELVIS WITH CONTRAST TECHNIQUE: Multidetector CT imaging of the abdomen and pelvis was performed using the standard protocol following bolus administration of intravenous contrast. CONTRAST:  162mL ISOVUE-300 IOPAMIDOL (ISOVUE-300) INJECTION 61% COMPARISON:  CT scan of March 20, 2017. FINDINGS: Lower chest: No acute abnormality. Hepatobiliary: No focal liver abnormality is seen. Status post cholecystectomy. No biliary dilatation. Pancreas: Unremarkable. No pancreatic ductal dilatation or surrounding inflammatory changes. Spleen: Normal in size without focal abnormality. Adrenals/Urinary Tract: Adrenal glands are unremarkable. Kidneys are normal, without renal calculi, focal lesion, or hydronephrosis. Bladder is unremarkable. Stomach/Bowel: Stomach is within normal limits. Appendix appears normal. No evidence of bowel wall thickening, distention, or inflammatory changes. Vascular/Lymphatic: No significant vascular findings are present. No enlarged abdominal or pelvic lymph nodes. Reproductive: Uterus and bilateral adnexa are unremarkable. Other: No abdominal wall hernia or abnormality. No abdominopelvic ascites. Musculoskeletal: No acute or significant osseous findings. IMPRESSION: No abnormality seen in the abdomen or pelvis. Electronically Signed   By: Marijo Conception, M.D.   On: 02/16/2018 15:25   Dg Abd Acute W/chest  Result Date: 02/22/2018 CLINICAL DATA:  Mid abdominal pain for 1 week, worsening tonight. EXAM: DG ABDOMEN ACUTE W/ 1V CHEST COMPARISON:  Chest radiograph and CT abdomen and pelvis February 16, 2018 FINDINGS: Cardiomediastinal silhouette is normal. Lungs are clear, no pleural effusions. No  pneumothorax. Soft tissue planes and included osseous structures are unremarkable. Bowel gas pattern is nondilated and nonobstructive. Small volume retained large bowel stool. Surgical clips in the  included right abdomen compatible with cholecystectomy. No intra-abdominal mass effect, pathologic calcifications or free air. Soft tissue planes and included osseous structures are non-suspicious. Mild levoscoliosis may be positional. IMPRESSION: 1. Normal chest radiograph. 2. Normal bowel gas pattern. Electronically Signed   By: Elon Alas M.D.   On: 02/22/2018 01:26   Xr Thoracic Spine 2 View  Result Date: 02/08/2018 Thoracic spine: Mild scoliosis with convexity to the left mid T-spine to upper lumbar spine.  Disc space overall well-maintained.  No acute fractures.  No spondylolisthesis.  No bony malleus otherwise.  Lung fields appear clear.  Xr Lumbar Spine 2-3 Views  Result Date: 02/08/2018 Lumbar spine AP and lateral views: No acute fracture.  Mild scoliosis of the proximal lumbar thoracic junction.  No spondylolisthesis.  Disc space overall well-maintained.  No bony abnormalities.  Hips are seen on the AP view and shows bilateral hips to be well preserved and located.   Time Spent in minutes  25   Reza Crymes M.D on 03/09/2018 at 9:23 AM  Between 7am to 7pm - Pager - [phone]  After 7pm go to [email or web address] - password [facility]  [facility] -  Office  [phone]

## 2018-03-09 NOTE — Progress Notes (Signed)
Subjective:  Continues to have postprandial upper abdominal pain with dry heaves but no vomiting since admission. Small BM with enema yesterday. On Linzess, Miralax this admission.   Objective: Vital signs in last 24 hours: Temp:  [98.3 F (36.8 C)-98.4 F (36.9 C)] 98.4 F (36.9 C) (02/19 0548) Pulse Rate:  [47-58] 54 (02/19 0548) Resp:  [15-20] 20 (02/19 0548) BP: (97-120)/(67-82) 117/69 (02/19 0548) SpO2:  [98 %-100 %] 100 % (02/19 0548) Weight:  [67.9 kg-68.7 kg] 67.9 kg (02/19 0548) Last BM Date: 03/08/18 General:   Alert,  Well-developed, well-nourished, pleasant and cooperative in NAD Head:  Normocephalic and atraumatic. Eyes:  Sclera clear, no icterus.  Abdomen:  Soft,moderate epigastric tenderness and nondistended.Normal bowel sounds, without guarding, and without rebound.   Extremities:  Without clubbing, deformity or edema. Neurologic:  Alert and  oriented x4;  grossly normal neurologically. Skin:  Intact without significant lesions or rashes. Psych:  Alert and cooperative. Normal mood and affect.  Intake/Output from previous day: 02/18 0701 - 02/19 0700 In: 1005 [I.V.:1005] Out: 302 [Urine:300; Stool:2] Intake/Output this shift: No intake/output data recorded.  Lab Results: CBC Recent Labs    03/07/18 1927 03/08/18 0607  WBC 9.9 6.0  HGB 11.9* 11.8*  HCT 38.1 38.2  MCV 95.3 96.5  PLT 184 147*   BMET Recent Labs    03/07/18 1927 03/08/18 0607  NA 140 141  K 3.8 3.8  CL 108 108  CO2 25 25  GLUCOSE 85 107*  BUN 21* 19  CREATININE 0.85 0.65  CALCIUM 9.7 9.5   LFTs Recent Labs    03/07/18 1927  BILITOT 0.1*  ALKPHOS 60  AST 28  ALT 36  PROT 7.7  ALBUMIN 4.7   Recent Labs    03/07/18 1927  LIPASE 28   PT/INR No results for input(s): LABPROT, INR in the last 72 hours.    Imaging Studies: Dg Chest 2 View  Result Date: 02/16/2018 CLINICAL DATA:  Short of breath EXAM: CHEST - 2 VIEW COMPARISON:  02/08/2018 FINDINGS: The heart size and  mediastinal contours are within normal limits. Both lungs are clear. The visualized skeletal structures are unremarkable. IMPRESSION: No active cardiopulmonary disease. Electronically Signed   By: Franchot Gallo M.D.   On: 02/16/2018 16:57   Ct Abdomen Pelvis W Contrast  Result Date: 03/07/2018 CLINICAL DATA:  38 year old female with abdominal pain for 3 weeks. History of gastroparesis. EXAM: CT ABDOMEN AND PELVIS WITH CONTRAST TECHNIQUE: Multidetector CT imaging of the abdomen and pelvis was performed using the standard protocol following bolus administration of intravenous contrast. CONTRAST:  180mL OMNIPAQUE IOHEXOL 300 MG/ML  SOLN COMPARISON:  CT Abdomen and Pelvis 02/16/2018 and earlier. FINDINGS: Lower chest: Minor lung base atelectasis. No cardiomegaly, pericardial effusion, pleural effusion. Hepatobiliary: Surgically absent gallbladder. Negative liver. Pancreas: Negative. Spleen: Negative. Adrenals/Urinary Tract: Normal adrenal glands. Bilateral renal enhancement and contrast excretion is symmetric and within normal limits. Proximal ureters are decompressed. No perinephric stranding. Unremarkable urinary bladder. Stomach/Bowel: Redundant large bowel with increased retained stool compared to last month, but no large bowel inflammation. There is retained oral contrast in the appendix which remains normal (series 2, image 49). Negative terminal ileum. No dilated small bowel. The stomach is fairly decompressed. The duodenum is stable and within normal limits. No free air, free fluid. Vascular/Lymphatic: Major arterial structures appear patent. There is mild soft atherosclerotic plaque in the distal aorta. Portal venous system is patent. No lymphadenopathy. Reproductive: Surgically absent uterus. Diminutive or absent ovaries. Other: No  pelvic free fluid. Musculoskeletal: Negative. IMPRESSION: Increased stool in redundant large bowel since the CT last month, otherwise negative. Electronically Signed   By: Genevie Ann M.D.   On: 03/07/2018 22:49   Ct Abdomen Pelvis W Contrast  Result Date: 02/16/2018 CLINICAL DATA:  Acute generalized abdominal pain. EXAM: CT ABDOMEN AND PELVIS WITH CONTRAST TECHNIQUE: Multidetector CT imaging of the abdomen and pelvis was performed using the standard protocol following bolus administration of intravenous contrast. CONTRAST:  119mL ISOVUE-300 IOPAMIDOL (ISOVUE-300) INJECTION 61% COMPARISON:  CT scan of March 20, 2017. FINDINGS: Lower chest: No acute abnormality. Hepatobiliary: No focal liver abnormality is seen. Status post cholecystectomy. No biliary dilatation. Pancreas: Unremarkable. No pancreatic ductal dilatation or surrounding inflammatory changes. Spleen: Normal in size without focal abnormality. Adrenals/Urinary Tract: Adrenal glands are unremarkable. Kidneys are normal, without renal calculi, focal lesion, or hydronephrosis. Bladder is unremarkable. Stomach/Bowel: Stomach is within normal limits. Appendix appears normal. No evidence of bowel wall thickening, distention, or inflammatory changes. Vascular/Lymphatic: No significant vascular findings are present. No enlarged abdominal or pelvic lymph nodes. Reproductive: Uterus and bilateral adnexa are unremarkable. Other: No abdominal wall hernia or abnormality. No abdominopelvic ascites. Musculoskeletal: No acute or significant osseous findings. IMPRESSION: No abnormality seen in the abdomen or pelvis. Electronically Signed   By: Marijo Conception, M.D.   On: 02/16/2018 15:25   Dg Abd Acute W/chest  Result Date: 02/22/2018 CLINICAL DATA:  Mid abdominal pain for 1 week, worsening tonight. EXAM: DG ABDOMEN ACUTE W/ 1V CHEST COMPARISON:  Chest radiograph and CT abdomen and pelvis February 16, 2018 FINDINGS: Cardiomediastinal silhouette is normal. Lungs are clear, no pleural effusions. No pneumothorax. Soft tissue planes and included osseous structures are unremarkable. Bowel gas pattern is nondilated and nonobstructive. Small volume  retained large bowel stool. Surgical clips in the included right abdomen compatible with cholecystectomy. No intra-abdominal mass effect, pathologic calcifications or free air. Soft tissue planes and included osseous structures are non-suspicious. Mild levoscoliosis may be positional. IMPRESSION: 1. Normal chest radiograph. 2. Normal bowel gas pattern. Electronically Signed   By: Elon Alas M.D.   On: 02/22/2018 01:26   Xr Thoracic Spine 2 View  Result Date: 02/08/2018 Thoracic spine: Mild scoliosis with convexity to the left mid T-spine to upper lumbar spine.  Disc space overall well-maintained.  No acute fractures.  No spondylolisthesis.  No bony malleus otherwise.  Lung fields appear clear.  Xr Lumbar Spine 2-3 Views  Result Date: 02/08/2018 Lumbar spine AP and lateral views: No acute fracture.  Mild scoliosis of the proximal lumbar thoracic junction.  No spondylolisthesis.  Disc space overall well-maintained.  No bony abnormalities.  Hips are seen on the AP view and shows bilateral hips to be well preserved and located. [2 weeks]   Assessment:  38 year old female with acute on chronic abdominal pain worsening over the past several weeks, associated with nausea and vomiting, early satiety, symptoms worse postprandially.  She has a history of pyloric channel spasms requiring Botox historically (first injection in 12/2010, 01/2013, 10/2015), last EGD in October 2017.  CT abdomen pelvis with contrast 03/07/2018 without acute findings however increased stool load since January study. Abdominal pain seemed to intensify after starting diclofenac as an outpatient without PPI recently. Cannot rule out gastritis/PUD, recurrent pyloric channel spasms. Constipation may be playing a role as well.  Continues to have epigastric pain, dry heaves with clear liquids and taking medications. Use of reglan intermittently at home with prior abnormal GES 2012 (prior to receiving botox  injections). Discussed with  Dr. Gala Romney. Plan for EGD with propofol tomorrow with possible pyloric channel dilatation +/- botox.    Plan: 1. PPI twice daily. 2. Continue Linzess 290 mcg daily.  Continue MiraLAX twice daily. 3. EGD with propofol tomorrow with possible pyloric channel dilation +/- botox.  4. Stop Reglan for now given history of gastric outlet obstruction and questionable recurrent pyloric spasm/stenosis.  5. Encouraged marijuana cessation.  6. Stop Lovenox in preparation for EGD tomorrow.  7. Clear liquids today. Npo at midnight.   Laureen Ochs. Bernarda Caffey [facility] [phone] 2/19/20209:43 AM     LOS: 0 days

## 2018-03-10 ENCOUNTER — Observation Stay (HOSPITAL_COMMUNITY): Payer: Medicare HMO | Admitting: Anesthesiology

## 2018-03-10 ENCOUNTER — Encounter (HOSPITAL_COMMUNITY)
Admission: EM | Disposition: A | Discharge status: Home / Self Care | Payer: Self-pay | Source: Home / Self Care | Attending: Emergency Medicine

## 2018-03-10 ENCOUNTER — Encounter (HOSPITAL_COMMUNITY): Payer: Self-pay | Admitting: *Deleted

## 2018-03-10 DIAGNOSIS — K313 Pylorospasm, not elsewhere classified: Secondary | ICD-10-CM | POA: Diagnosis not present

## 2018-03-10 DIAGNOSIS — R109 Unspecified abdominal pain: Secondary | ICD-10-CM | POA: Diagnosis not present

## 2018-03-10 DIAGNOSIS — G8929 Other chronic pain: Secondary | ICD-10-CM | POA: Diagnosis not present

## 2018-03-10 DIAGNOSIS — K5900 Constipation, unspecified: Secondary | ICD-10-CM | POA: Diagnosis not present

## 2018-03-10 DIAGNOSIS — R1084 Generalized abdominal pain: Secondary | ICD-10-CM | POA: Diagnosis not present

## 2018-03-10 DIAGNOSIS — G43909 Migraine, unspecified, not intractable, without status migrainosus: Secondary | ICD-10-CM | POA: Diagnosis not present

## 2018-03-10 DIAGNOSIS — J45991 Cough variant asthma: Secondary | ICD-10-CM | POA: Diagnosis not present

## 2018-03-10 DIAGNOSIS — J449 Chronic obstructive pulmonary disease, unspecified: Secondary | ICD-10-CM | POA: Diagnosis not present

## 2018-03-10 DIAGNOSIS — K3184 Gastroparesis: Secondary | ICD-10-CM | POA: Diagnosis not present

## 2018-03-10 DIAGNOSIS — K449 Diaphragmatic hernia without obstruction or gangrene: Secondary | ICD-10-CM | POA: Diagnosis not present

## 2018-03-10 DIAGNOSIS — R112 Nausea with vomiting, unspecified: Secondary | ICD-10-CM | POA: Diagnosis not present

## 2018-03-10 DIAGNOSIS — F329 Major depressive disorder, single episode, unspecified: Secondary | ICD-10-CM | POA: Diagnosis not present

## 2018-03-10 HISTORY — PX: ESOPHAGOGASTRODUODENOSCOPY (EGD) WITH PROPOFOL: SHX5813

## 2018-03-10 HISTORY — PX: BOTOX INJECTION: SHX5754

## 2018-03-10 HISTORY — PX: BALLOON DILATION: SHX5330

## 2018-03-10 SURGERY — ESOPHAGOGASTRODUODENOSCOPY (EGD) WITH PROPOFOL
Anesthesia: General

## 2018-03-10 MED ORDER — PROMETHAZINE HCL 25 MG/ML IJ SOLN: 6.2500 mg | INTRAMUSCULAR | Status: DC | PRN

## 2018-03-10 MED ORDER — PROPOFOL 10 MG/ML IV BOLUS
INTRAVENOUS | Status: DC | PRN
  Administered 2018-03-10: 20 mg via INTRAVENOUS

## 2018-03-10 MED ORDER — LACTATED RINGERS IV SOLN
INTRAVENOUS | Status: DC
  Administered 2018-03-10: 1000 mL via INTRAVENOUS

## 2018-03-10 MED ORDER — LACTATED RINGERS IV SOLN
INTRAVENOUS | Status: DC | PRN
  Administered 2018-03-10: 12:00:00 via INTRAVENOUS

## 2018-03-10 MED ORDER — PROPOFOL 500 MG/50ML IV EMUL
INTRAVENOUS | Status: DC | PRN
  Administered 2018-03-10: 16:00:00 via INTRAVENOUS
  Administered 2018-03-10: 125 ug/kg/min via INTRAVENOUS

## 2018-03-10 MED ORDER — MIDAZOLAM HCL 2 MG/2ML IJ SOLN: 0.5000 mg | Freq: Once | INTRAMUSCULAR | Status: DC | PRN

## 2018-03-10 MED ORDER — STERILE WATER FOR IRRIGATION IR SOLN
Status: DC | PRN
  Administered 2018-03-10: 1.5 mL

## 2018-03-10 NOTE — Anesthesia Preprocedure Evaluation (Signed)
Anesthesia Evaluation  Patient identified by MRN, date of birth, ID band Patient awake    Reviewed: Allergy & Precautions, NPO status , Patient's Chart, lab work & pertinent test results  Airway Mallampati: I  TM Distance: >3 FB Neck ROM: Full    Dental no notable dental hx. (+) Teeth Intact   Pulmonary asthma , COPD,  COPD inhaler, Current Smoker,    Pulmonary exam normal breath sounds clear to auscultation       Cardiovascular Exercise Tolerance: Good negative cardio ROS Normal cardiovascular examI Rhythm:Regular Rate:Normal     Neuro/Psych  Headaches, Depression  Neuromuscular disease negative psych ROS   GI/Hepatic Neg liver ROS, PUD, GERD  Medicated and Controlled,  Endo/Other  negative endocrine ROS  Renal/GU negative Renal ROS  negative genitourinary   Musculoskeletal negative musculoskeletal ROS (+)   Abdominal   Peds negative pediatric ROS (+)  Hematology negative hematology ROS (+) anemia ,   Anesthesia Other Findings MJ use -past h/o polysubstance  Reproductive/Obstetrics negative OB ROS                             Anesthesia Physical Anesthesia Plan  ASA: II  Anesthesia Plan: General   Post-op Pain Management:    Induction: Intravenous  PONV Risk Score and Plan:   Airway Management Planned: Nasal Cannula and Simple Face Mask  Additional Equipment:   Intra-op Plan:   Post-operative Plan: Extubation in OR  Informed Consent: I have reviewed the patients History and Physical, chart, labs and discussed the procedure including the risks, benefits and alternatives for the proposed anesthesia with the patient or authorized representative who has indicated his/her understanding and acceptance.     Dental advisory given  Plan Discussed with: CRNA  Anesthesia Plan Comments:         Anesthesia Quick Evaluation

## 2018-03-10 NOTE — Transfer of Care (Signed)
Immediate Anesthesia Transfer of Care Note  Patient: Tracey Daniel  Procedure(s) Performed: ESOPHAGOGASTRODUODENOSCOPY (EGD) WITH PROPOFOL (N/A ) BALLOON DILATION (N/A ) BOTOX INJECTION (N/A )  Patient Location: PACU  Anesthesia Type:MAC  Level of Consciousness: awake and patient cooperative  Airway & Oxygen Therapy: Patient Spontanous Breathing and Patient connected to nasal cannula oxygen  Post-op Assessment: Report given to RN, Post -op Vital signs reviewed and stable and Patient moving all extremities  Post vital signs: Reviewed and stable  Last Vitals:  Vitals Value Taken Time  BP    Temp    Pulse    Resp    SpO2      Last Pain:  Vitals:   03/10/18 1517  TempSrc:   PainSc: 8       Patients Stated Pain Goal: 8 (19/50/93 2671)  Complications: No apparent anesthesia complications

## 2018-03-10 NOTE — Anesthesia Postprocedure Evaluation (Signed)
Anesthesia Post Note  Patient: Ingri Diemer Hubka  Procedure(s) Performed: ESOPHAGOGASTRODUODENOSCOPY (EGD) WITH PROPOFOL (N/A ) BALLOON DILATION (N/A ) BOTOX INJECTION (N/A )  Patient location during evaluation: PACU Anesthesia Type: General Level of consciousness: awake and alert Pain management: pain level controlled Vital Signs Assessment: post-procedure vital signs reviewed and stable Respiratory status: spontaneous breathing, nonlabored ventilation and respiratory function stable Cardiovascular status: blood pressure returned to baseline Postop Assessment: no apparent nausea or vomiting Anesthetic complications: no     Last Vitals:  Vitals:   03/10/18 0806 03/10/18 1417  BP:  128/86  Pulse:  (!) 58  Resp:  20  Temp:  36.4 C  SpO2: 100% 98%    Last Pain:  Vitals:   03/10/18 1517  TempSrc:   PainSc: 8                  Talecia Sherlin J

## 2018-03-10 NOTE — Progress Notes (Signed)
Patient seen in short stay.  Able for EGD, etc. per plan.  The risks, benefits, limitations, alternatives and imponderables have been reviewed with the patient. Potential for esophageal dilation, biopsy, etc. have also been reviewed.  Questions have been answered. All parties agreeable.  Further recommendations to follow.

## 2018-03-10 NOTE — Op Note (Signed)
[facility] Patient Name: Tracey Daniel Procedure Date: 03/10/2018 3:15 PM MRN: [id number] Date of Birth: [birth year] Attending MD: Norvel Richards , MD CSN: [id number] Age: 38 Admit Type: Outpatient Procedure:                Upper GI endoscopy Indications:              Nausea with vomiting Providers:                Norvel Richards, MD, Charlsie Quest. Theda Sers RN, RN,                            Nelma Rothman, Technician Referring MD:              Medicines:                Propofol per Anesthesia Complications:            No immediate complications. Estimated Blood Loss:     Estimated blood loss was minimal. Procedure:                Pre-Anesthesia Assessment:                           - Prior to the procedure, a History and Physical                            was performed, and patient medications and                            allergies were reviewed. The patient's tolerance of                            previous anesthesia was also reviewed. The risks                            and benefits of the procedure and the sedation                            options and risks were discussed with the patient.                            All questions were answered, and informed consent                            was obtained. Prior Anticoagulants: The patient has                            taken no previous anticoagulant or antiplatelet                            agents. ASA Grade Assessment: II - A patient with                            mild systemic disease. After reviewing the risks  and benefits, the patient was deemed in                            satisfactory condition to undergo the procedure.                           After obtaining informed consent, the endoscope was                            passed under direct vision. Throughout the                            procedure, the patient's blood pressure, pulse, and                            oxygen  saturations were monitored continuously. The                            GIF-H190 ([id number]) scope was introduced through the                            and advanced to the second part of duodenum. The                            upper GI endoscopy was accomplished without                            difficulty. The patient tolerated the procedure                            well. Scope In: 3:20:16 PM Scope Out: 3:38:37 PM Total Procedure Duration: 0 hours 18 minutes 21 seconds  Findings:      The examined esophagus was normal. Gastric cavity dilated and elongated.      A small hiatal hernia was present. I was unable to reach the pyloric       channel with the adult gastroscope due to dilation/elongation. I       withdrew and obtained the pediatric colonoscope and was able to reach       the pyloric channel which appeared to be somewhat narrowed; I was able       to advance the pediatric colonoscope beyond it into the duodenum without       much difficulty.      The tip of the scope was pulled back to the distal antrum where 20 mm       TTS balloon was advanced across the pyloric channel under endoscopic       control. It was a inflated slowly to 18 mm and on to 20 mm (4.5 atm) x 1       minute and taken down. This maneuver clearly opened up the pyloric       channel. There was some mucosal direct disruption, minimal bleeding and       no apparent complication. I was able to easily advance the scope across       the pyloric channel.      The duodenal bulb and second portion of the duodenum were  normal. Impression:               - Normal esophagus. Elongated, dilated stomach                            likely stigmata of slow gastric emptying due to                            narrowing of the pyloric channel. Status post                            balloon dilation of the pyloric channel.                           - Small hiatal hernia.                           - Normal duodenal bulb and  second portion of the                            duodenum.                           - No specimens collected. Moderate Sedation:      Moderate (conscious) sedation was personally administered by an       anesthesia professional. The following parameters were monitored: oxygen       saturation, heart rate, blood pressure, and response to care. Recommendation:           - Low residue, low-fat diet. Continue daily PPI. If                            symptomatic stenosis recurs, would consider a                            surgical pyloroplasty. Reassess tomorrow morning. Procedure Code(s):        --- Professional ---                           [phone], Esophagogastroduodenoscopy, flexible,                            transoral; diagnostic, including collection of                            specimen(s) by brushing or washing, when performed                            (separate procedure) Diagnosis Code(s):        --- Professional ---                           K44.9, Diaphragmatic hernia without obstruction or                            gangrene  R11.2, Nausea with vomiting, unspecified CPT copyright 2018 [employer]. All rights reserved. The codes documented in this report are preliminary and upon coder review may  be revised to meet current compliance requirements. Cristopher Estimable. Rourk, MD Norvel Richards, MD 03/10/2018 3:51:23 PM This report has been signed electronically. Number of Addenda: 0

## 2018-03-10 NOTE — Progress Notes (Signed)
PROGRESS NOTE                                                                                                                                                                                                             Patient Demographics:    Tracey Daniel, is a 38 y.o. female, DOB - [birth year], HWE:[id number]  Admit date - 03/07/2018   Admitting Physician Vianne Bulls, MD  Outpatient Primary MD for the patient is [name], MD  LOS - 0  Outpatient Specialists: GI  Chief Complaint  Patient presents with  . Abdominal Pain       Brief Narrative 38 year old female with history of pelvic spasms, functional gastric outlet obstruction and chronic abdominal pain, depression, chronic back pain, and asthma presented to the GI office with ongoing upper and mid abdominal pain.  She was sent to the ED for further work-up.  CT scan in the ED showed increased stool burden.   Subjective:   Patient still c/o epigastric pain off and on. Also had some nausea yesterday. No further lower abdominal pain.   Assessment  & Plan :    Principal Problem:   Intractable abdominal pain Etiology unclear.  Had a bowel movement yesterday and lower abdominal discomfort resolved after bowel movement yesterday.  Continue Linzess and Dilaudid as needed for pain. PPI increased to twice daily.  Bentyl held due to constipation. reglan discontinued as pyloric stenosis spasms of concern. GI following.  Given persistent pain plan for EGD today.  Active Problems:   Depression Continue Zoloft and Risperdal    Asthma, cough variant Continue albuterol and Dulera  Cannabis use/tobacco use Counseled on cessation.     Code Status : Full code  Family Communication  : None at bedside, lives with her fianc  Disposition Plan  : Home pending EGD today, improvement in abdominal pain andonce  tolerating diet  Barriers For Discharge : Active  symptoms  Consults  : GI  Procedures  : CT abdomen  DVT Prophylaxis  :  None ( lovenox held for EGD today)  Lab Results  Component Value Date   PLT 147 (L) 03/08/2018    Antibiotics  :   Anti-infectives (From admission, onward)   None        Objective:   Vitals:   03/09/18 2216 03/10/18 8938 03/10/18 1017  03/10/18 0806  BP: 128/76 115/78 110/66   Pulse: 61 (!) 52 (!) 51   Resp: 18 20 16    Temp: 98.6 F (37 C) 98.4 F (36.9 C) 97.7 F (36.5 C)   TempSrc: Oral  Oral   SpO2: 99% 100% 100% 100%  Weight:  69.7 kg    Height:        Wt Readings from Last 3 Encounters:  03/10/18 69.7 kg  03/07/18 67.6 kg  02/21/18 68 kg     Intake/Output Summary (Last 24 hours) at 03/10/2018 0831 Last data filed at 03/09/2018 1700 Gross per 24 hour  Intake 600 ml  Output -  Net 600 ml    Physical exam: Not in distress HEENT: moist mucosa,supple neck Chest:clear b/l CVS: NS1&S2 GI: soft, nd, bs+ epigastric tenderness+ Musculoskeletal: warm, no edema     Data Review:    CBC Recent Labs  Lab 03/07/18 1927 03/08/18 0607  WBC 9.9 6.0  HGB 11.9* 11.8*  HCT 38.1 38.2  PLT 184 147*  MCV 95.3 96.5  MCH 29.8 29.8  MCHC 31.2 30.9  RDW 13.6 13.8  LYMPHSABS 3.3 1.6  MONOABS 0.6 0.5  EOSABS 0.2 0.2  BASOSABS 0.0 0.0    Chemistries  Recent Labs  Lab 03/07/18 1927 03/08/18 0607  NA 140 141  K 3.8 3.8  CL 108 108  CO2 25 25  GLUCOSE 85 107*  BUN 21* 19  CREATININE 0.85 0.65  CALCIUM 9.7 9.5  AST 28  --   ALT 36  --   ALKPHOS 60  --   BILITOT 0.1*  --    ------------------------------------------------------------------------------------------------------------------ No results for input(s): CHOL, HDL, LDLCALC, TRIG, CHOLHDL, LDLDIRECT in the last 72 hours.  Lab Results  Component Value Date   HGBA1C 5.4 12/29/2010   ------------------------------------------------------------------------------------------------------------------ No results for  input(s): TSH, T4TOTAL, T3FREE, THYROIDAB in the last 72 hours.  Invalid input(s): FREET3 ------------------------------------------------------------------------------------------------------------------ No results for input(s): VITAMINB12, FOLATE, FERRITIN, TIBC, IRON, RETICCTPCT in the last 72 hours.  Coagulation profile No results for input(s): INR, PROTIME in the last 168 hours.  No results for input(s): DDIMER in the last 72 hours.  Cardiac Enzymes Recent Labs  Lab 03/07/18 1927  TROPONINI <0.03   ------------------------------------------------------------------------------------------------------------------ No results found for: BNP  Inpatient Medications  Scheduled Meds: . feeding supplement (ENSURE ENLIVE)  237 mL Oral TID WC  . gabapentin  200 mg Oral TID  . linaclotide  290 mcg Oral Daily  . mometasone-formoterol  2 puff Inhalation BID  . pantoprazole  40 mg Oral BID AC  . polyethylene glycol  17 g Oral BID  . potassium chloride SA  20 mEq Oral Daily  . pravastatin  20 mg Oral q1800  . propranolol  10 mg Oral TID  . risperiDONE  0.5 mg Oral QHS  . sertraline  50 mg Oral QHS  . zonisamide  150 mg Oral QHS   Continuous Infusions: . sodium chloride     PRN Meds:.acetaminophen **OR** acetaminophen, albuterol, bisacodyl, HYDROcodone-acetaminophen, HYDROmorphone (DILAUDID) injection, ondansetron **OR** ondansetron (ZOFRAN) IV, polyethylene glycol, sucralfate, tiZANidine  Micro Results No results found for this or any previous visit (from the past 240 hour(s)).  Radiology Reports Dg Chest 2 View  Result Date: 02/16/2018 CLINICAL DATA:  Short of breath EXAM: CHEST - 2 VIEW COMPARISON:  02/08/2018 FINDINGS: The heart size and mediastinal contours are within normal limits. Both lungs are clear. The visualized skeletal structures are unremarkable. IMPRESSION: No active cardiopulmonary disease. Electronically Signed  By: Franchot Gallo M.D.   On: 02/16/2018 16:57    Ct Abdomen Pelvis W Contrast  Result Date: 03/07/2018 CLINICAL DATA:  38 year old female with abdominal pain for 3 weeks. History of gastroparesis. EXAM: CT ABDOMEN AND PELVIS WITH CONTRAST TECHNIQUE: Multidetector CT imaging of the abdomen and pelvis was performed using the standard protocol following bolus administration of intravenous contrast. CONTRAST:  113mL OMNIPAQUE IOHEXOL 300 MG/ML  SOLN COMPARISON:  CT Abdomen and Pelvis 02/16/2018 and earlier. FINDINGS: Lower chest: Minor lung base atelectasis. No cardiomegaly, pericardial effusion, pleural effusion. Hepatobiliary: Surgically absent gallbladder. Negative liver. Pancreas: Negative. Spleen: Negative. Adrenals/Urinary Tract: Normal adrenal glands. Bilateral renal enhancement and contrast excretion is symmetric and within normal limits. Proximal ureters are decompressed. No perinephric stranding. Unremarkable urinary bladder. Stomach/Bowel: Redundant large bowel with increased retained stool compared to last month, but no large bowel inflammation. There is retained oral contrast in the appendix which remains normal (series 2, image 49). Negative terminal ileum. No dilated small bowel. The stomach is fairly decompressed. The duodenum is stable and within normal limits. No free air, free fluid. Vascular/Lymphatic: Major arterial structures appear patent. There is mild soft atherosclerotic plaque in the distal aorta. Portal venous system is patent. No lymphadenopathy. Reproductive: Surgically absent uterus. Diminutive or absent ovaries. Other: No pelvic free fluid. Musculoskeletal: Negative. IMPRESSION: Increased stool in redundant large bowel since the CT last month, otherwise negative. Electronically Signed   By: Genevie Ann M.D.   On: 03/07/2018 22:49   Ct Abdomen Pelvis W Contrast  Result Date: 02/16/2018 CLINICAL DATA:  Acute generalized abdominal pain. EXAM: CT ABDOMEN AND PELVIS WITH CONTRAST TECHNIQUE: Multidetector CT imaging of the abdomen and  pelvis was performed using the standard protocol following bolus administration of intravenous contrast. CONTRAST:  158mL ISOVUE-300 IOPAMIDOL (ISOVUE-300) INJECTION 61% COMPARISON:  CT scan of March 20, 2017. FINDINGS: Lower chest: No acute abnormality. Hepatobiliary: No focal liver abnormality is seen. Status post cholecystectomy. No biliary dilatation. Pancreas: Unremarkable. No pancreatic ductal dilatation or surrounding inflammatory changes. Spleen: Normal in size without focal abnormality. Adrenals/Urinary Tract: Adrenal glands are unremarkable. Kidneys are normal, without renal calculi, focal lesion, or hydronephrosis. Bladder is unremarkable. Stomach/Bowel: Stomach is within normal limits. Appendix appears normal. No evidence of bowel wall thickening, distention, or inflammatory changes. Vascular/Lymphatic: No significant vascular findings are present. No enlarged abdominal or pelvic lymph nodes. Reproductive: Uterus and bilateral adnexa are unremarkable. Other: No abdominal wall hernia or abnormality. No abdominopelvic ascites. Musculoskeletal: No acute or significant osseous findings. IMPRESSION: No abnormality seen in the abdomen or pelvis. Electronically Signed   By: Marijo Conception, M.D.   On: 02/16/2018 15:25   Dg Abd Acute W/chest  Result Date: 02/22/2018 CLINICAL DATA:  Mid abdominal pain for 1 week, worsening tonight. EXAM: DG ABDOMEN ACUTE W/ 1V CHEST COMPARISON:  Chest radiograph and CT abdomen and pelvis February 16, 2018 FINDINGS: Cardiomediastinal silhouette is normal. Lungs are clear, no pleural effusions. No pneumothorax. Soft tissue planes and included osseous structures are unremarkable. Bowel gas pattern is nondilated and nonobstructive. Small volume retained large bowel stool. Surgical clips in the included right abdomen compatible with cholecystectomy. No intra-abdominal mass effect, pathologic calcifications or free air. Soft tissue planes and included osseous structures are  non-suspicious. Mild levoscoliosis may be positional. IMPRESSION: 1. Normal chest radiograph. 2. Normal bowel gas pattern. Electronically Signed   By: Elon Alas M.D.   On: 02/22/2018 01:26   Xr Thoracic Spine 2 View  Result Date: 02/08/2018 Thoracic spine:  Mild scoliosis with convexity to the left mid T-spine to upper lumbar spine.  Disc space overall well-maintained.  No acute fractures.  No spondylolisthesis.  No bony malleus otherwise.  Lung fields appear clear.  Xr Lumbar Spine 2-3 Views  Result Date: 02/08/2018 Lumbar spine AP and lateral views: No acute fracture.  Mild scoliosis of the proximal lumbar thoracic junction.  No spondylolisthesis.  Disc space overall well-maintained.  No bony abnormalities.  Hips are seen on the AP view and shows bilateral hips to be well preserved and located.   Time Spent in minutes  25   Soila Printup M.D on 03/10/2018 at 8:31 AM  Between 7am to 7pm - Pager - [phone]  After 7pm go to [email or web address] - password [facility]  [facility] -  Office  [phone]

## 2018-03-11 DIAGNOSIS — F1290 Cannabis use, unspecified, uncomplicated: Secondary | ICD-10-CM | POA: Diagnosis present

## 2018-03-11 DIAGNOSIS — K449 Diaphragmatic hernia without obstruction or gangrene: Secondary | ICD-10-CM | POA: Diagnosis not present

## 2018-03-11 DIAGNOSIS — R109 Unspecified abdominal pain: Secondary | ICD-10-CM | POA: Diagnosis not present

## 2018-03-11 DIAGNOSIS — K3184 Gastroparesis: Secondary | ICD-10-CM | POA: Diagnosis not present

## 2018-03-11 DIAGNOSIS — F12988 Cannabis use, unspecified with other cannabis-induced disorder: Secondary | ICD-10-CM

## 2018-03-11 DIAGNOSIS — K313 Pylorospasm, not elsewhere classified: Secondary | ICD-10-CM | POA: Diagnosis not present

## 2018-03-11 LAB — BASIC METABOLIC PANEL
Anion gap: 8 (ref 5–15)
BUN: 19 mg/dL (ref 6–20)
CO2: 27 mmol/L (ref 22–32)
Calcium: 10 mg/dL (ref 8.9–10.3)
Chloride: 104 mmol/L (ref 98–111)
Creatinine, Ser: 1.07 mg/dL — ABNORMAL HIGH (ref 0.44–1.00)
GFR calc Af Amer: >60 mL/min (ref >60)
GFR calc non Af Amer: >60 mL/min (ref >60)
Glucose, Bld: 95 mg/dL (ref 70–99)
Potassium: 4.3 mmol/L (ref 3.5–5.1)
Sodium: 139 mmol/L (ref 135–145)

## 2018-03-11 MED ORDER — PROPRANOLOL HCL 10 MG PO TABS: 10.0000 mg | ORAL_TABLET | Freq: Three times a day (TID) | ORAL | 2 refills | Status: AC

## 2018-03-11 MED ORDER — PANTOPRAZOLE SODIUM 40 MG PO TBEC: 40.0000 mg | DELAYED_RELEASE_TABLET | Freq: Every day | ORAL | 4 refills | Status: DC

## 2018-03-11 MED ORDER — ONDANSETRON HCL 4 MG PO TABS: 4.0000 mg | ORAL_TABLET | Freq: Four times a day (QID) | ORAL | 0 refills | Status: DC | PRN

## 2018-03-11 MED ORDER — PRAVASTATIN SODIUM 20 MG PO TABS: 20.0000 mg | ORAL_TABLET | Freq: Every day | ORAL | 3 refills | Status: DC

## 2018-03-11 MED ORDER — ESTRADIOL 0.5 MG PO TABS: 0.5000 mg | ORAL_TABLET | Freq: Every day | ORAL | 3 refills | Status: DC

## 2018-03-11 MED ORDER — ACETAMINOPHEN 325 MG PO TABS: 650.0000 mg | ORAL_TABLET | Freq: Four times a day (QID) | ORAL | 0 refills | Status: DC | PRN

## 2018-03-11 MED ORDER — SENNOSIDES-DOCUSATE SODIUM 8.6-50 MG PO TABS: 2.0000 | ORAL_TABLET | Freq: Every day | ORAL | 3 refills | Status: DC

## 2018-03-11 MED ORDER — POLYETHYLENE GLYCOL 3350 17 G PO PACK: 17.0000 g | PACK | Freq: Two times a day (BID) | ORAL | 0 refills | Status: DC

## 2018-03-11 NOTE — Discharge Summary (Signed)
Tracey Daniel, is a 38 y.o. female  DOB 1980-06-13  MRN [id number].  Admission date:  03/07/2018  Admitting Physician  Vianne Bulls, MD  Discharge Date:  03/11/2018   Primary MD  [name], MD  Recommendations for primary care physician for things to follow:   1) you have cannabinoid hyperemesis syndrome----usually caused by daily use of marijuana----complete cessation of marijuana/cannabis use strongly advised 2) you have pyloric stenosis/narrowing of the end part of your stomach--- take antinausea medication as advised low-fat low residue diet advised, drink plenty fluids, avoid constipation 3)Avoid ibuprofen/Advil/Aleve/Motrin/Goody Powders/Naproxen/BC powders/Meloxicam/Diclofenac/Indomethacin and other Nonsteroidal anti-inflammatory medications as these will make you more likely to bleed and can cause stomach ulcers, can also cause Kidney problems.  4) please take your medications as prescribed   Admission Diagnosis  Gastroparesis [K31.84] Generalized abdominal pain [R10.84] Constipation, unspecified constipation type [K59.00]   Discharge Diagnosis  Gastroparesis [K31.84] Generalized abdominal pain [R10.84] Constipation, unspecified constipation type [K59.00]    Principal Problem:   Pyloric spasm/Stenosis Active Problems:   Gastric outlet obstruction   Cannabinoid hyperemesis syndrome (HCC)   Depression   Constipation   Asthma, cough variant   Intractable abdominal pain      Past Medical History:  Diagnosis Date  . Anemia of other chronic disease 11/29/2012  . Asthma   . Back pain, chronic   . Chronic abdominal pain   . Constipation   . COPD (chronic obstructive pulmonary disease) ([place])   . Cyst of skin    mid spine area  . Depression 2000   h/o suicidal ideation  . Gastric outlet obstruction   . Gastritis   . Gastroparesis   . Migraine   . Migraines   . Nausea  and vomiting    recurrent  . Nicotine addiction   . Osteoporosis   . Peptic ulcer disease   . Pyloric spasm 03/30/2011  . Seasonal allergies   . Sinusitis   . Substance abuse ([place]) 2008   marijuana  . Vitamin D deficiency     Past Surgical History:  Procedure Laterality Date  . BALLOON DILATION N/A 02/09/2013   Procedure: BALLOON DILATION;  Surgeon: Missy Sabins, MD;  Location: [facility];  Service: Endoscopy;  Laterality: N/A;  . BOTOX INJECTION N/A 02/09/2013   Procedure: BOTOX INJECTION;  Surgeon: Missy Sabins, MD;  Location: [facility];  Service: Endoscopy;  Laterality: N/A;  possible balloon  . BOTOX INJECTION N/A 10/24/2015   Procedure: BOTOX INJECTION;  Surgeon: Daneil Dolin, MD;  Location: [facility];  Service: Endoscopy;  Laterality: N/A;  . CARPAL TUNNEL RELEASE     left hand  . CHOLECYSTECTOMY     ?2002  . COLONOSCOPY WITH PROPOFOL N/A 09/20/2014   RMR: Normal ileo-colonoscopy  . ESOPHAGEAL DILATION  12/25/2010   Procedure: ESOPHAGEAL DILATION;  Surgeon: Dorothyann Peng, MD;  Location: [facility];  Service: Endoscopy;;  . ESOPHAGOGASTRODUODENOSCOPY  12/25/2010   Dorothyann Peng, MD;  moderate gastritis, ?goo secondary to pylorspasm. BX  showed reactive gstropathy no h.pyori, SB mucosa with intramucosal lymphocytosis and partial villous blunting (TTG 4.0 normal)  . ESOPHAGOGASTRODUODENOSCOPY N/A 11/14/2012   [id number] hiatal hernia. Abnormal gastric mucosa of  uncertain significance-status post biopsy. Subjectively, patient may have recurrent symptomatic, pylorospasm.  . ESOPHAGOGASTRODUODENOSCOPY (EGD) WITH PROPOFOL N/A 02/09/2013   elongated stomach, partial lower esophageal ring widely patent. No obvious pyloric stenosis s/p Botox  . ESOPHAGOGASTRODUODENOSCOPY (EGD) WITH PROPOFOL N/A 10/24/2015   Procedure: ESOPHAGOGASTRODUODENOSCOPY (EGD) WITH PROPOFOL;  Surgeon: Daneil Dolin, MD;  [facility];  Service: Endoscopy;  Laterality: N/A;  830   . laser  surgery on cervix    . NASAL SEPTUM SURGERY  01/29/2017  . TOE SURGERY         HPI  from the history and physical done on the day of admission:    Chief Complaint: Abdominal pain   HPI: Tracey Daniel is a 38 y.o. female with medical history significant for pyloric spasms with functional gastric outlet obstruction and chronic abdominal pain, chronic back pain, depression, and asthma, now presenting to the emergency department for evaluation of abdominal pain and nausea.  Patient is followed by GI for chronic abdominal pain and reports worsening in her pain over the past 3 to 4 weeks.  She also reports severe nausea but has not been vomiting recently.  Denies diarrhea, melena, or hematochezia.  She also denies any recent fevers or chills.  She reports that her pain is diffuse, but most severe in the upper abdomen, worse with eating, and with no alleviating factors identified.  She saw her gastroenterologist for these complaints today and was directed to the ED for further evaluation.  ED Course: Upon arrival to the ED, patient is found to be afebrile, saturating well on room air, bradycardic in the 50s, and with stable blood pressure.  EKG features a sinus rhythm.  Chemistry panel is notable for an elevated BUN to creatinine ratio and CBC is unremarkable.  Troponin is undetectable.  Patient was given IV Protonix, Zofran, morphine, and Dilaudid in the ED.  She continues to complain of severe pain and will be observed for further evaluation and management.     Hospital Course:     Intractable nausea vomiting and abdominal pain--- status post EGD with dilatation on 03/10/2018------??  Due to pyloric channel stenosis compounded by cannabis hyperemesis syndrome , EGD on 03/10/2018 with findings of Normal esophagus. Elongated, dilated stomach likely stigmata of slow gastric emptying due to narrowing of the pyloric channel. Status post balloon dilation of the pyloric channel.  Small hiatal hernia, -  Normal duodenal bulb and second portion of the  duodenum. Continue PPI and antiemetics  Active Problems:   Depression Continue Zoloft and Risperdal    Asthma, cough variant--- stable at this time, no flareup Continue albuterol and Dulera  Cannabis use/tobacco use Counseled on cessation especially given concerns about possible cannabis hyperemesis syndrome contributing to her intractable emesis and cyclical vomiting  Discharge Condition: stable  Follow UP--- with GI as advised   consults obtained - Gi  Diet and Activity recommendation:  As advised  Discharge Instructions    Discharge Instructions    Call MD for:  difficulty breathing, headache or visual disturbances   Complete by:  As directed    Call MD for:  persistant dizziness or light-headedness   Complete by:  As directed    Call MD for:  persistant nausea and vomiting   Complete by:  As directed  Call MD for:  severe uncontrolled pain   Complete by:  As directed    Call MD for:  temperature >100.4   Complete by:  As directed    Diet - low sodium heart healthy   Complete by:  As directed    Low-fat and low residue diet advised   Discharge instructions   Complete by:  As directed    1) you have cannabinoid hyperemesis syndrome----usually caused by daily use of marijuana----complete cessation of marijuana/cannabis use strongly advised 2) you have pyloric stenosis/narrowing of the end part of your stomach--- take antinausea medication as advised low-fat low residue diet advised, drink plenty fluids, avoid constipation 3)Avoid ibuprofen/Advil/Aleve/Motrin/Goody Powders/Naproxen/BC powders/Meloxicam/Diclofenac/Indomethacin and other Nonsteroidal anti-inflammatory medications as these will make you more likely to bleed and can cause stomach ulcers, can also cause Kidney problems.  4) please take your medications as prescribed   Increase activity slowly   Complete by:  As directed         Discharge Medications      Allergies as of 03/11/2018      Reactions   Benadryl [diphenhydramine] Anaphylaxis   Latex Itching, Other (See Comments)   "burning" where the tape goes   Penicillins Other (See Comments)   Unknown Has patient had a PCN reaction causing immediate rash, facial/tongue/throat swelling, SOB or lightheadedness with hypotension Yes Has patient had a PCN reaction causing severe rash involving mucus membranes or skin necrosis: Yes-hives  Has patient had a PCN reaction that required hospitalization Yes Has patient had a PCN reaction occurring within the last 10 years: Yes If all of the above answers are "NO", then may proceed with Cephalosporin use.      Medication List    STOP taking these medications   diclofenac 75 MG EC tablet Commonly known as:  VOLTAREN     TAKE these medications   acetaminophen 325 MG tablet Commonly known as:  TYLENOL Take 2 tablets (650 mg total) by mouth every 6 (six) hours as needed for mild pain (or Fever >/= 101).   azelastine 0.1 % nasal spray Commonly known as:  ASTELIN USE 2 SPRAYS IN EACH NOSTRIL TWICE DAILY AS DIRECTED What changed:  See the new instructions.   BIOTIN PO Take 1 tablet by mouth daily.   calcium-vitamin D 500-200 MG-UNIT tablet Commonly known as:  OSCAL WITH D Take 1 tablet by mouth 2 (two) times daily.   chlorhexidine 0.12 % solution Commonly known as:  PERIDEX 5 mLs by Mouth Rinse route every morning.   clobetasol cream 0.05 % Commonly known as:  TEMOVATE Apply 1 application topically 2 (two) times daily as needed (rash). Apply to rash   dicyclomine 20 MG tablet Commonly known as:  BENTYL Take 1 tablet (20 mg total) by mouth 2 (two) times daily.   estradiol 0.5 MG tablet Commonly known as:  ESTRACE Take 1 tablet (0.5 mg total) by mouth daily. What changed:    medication strength  See the new instructions.   EVENING PRIMROSE OIL PO Take 1 tablet by mouth 2 (two) times daily.   feeding supplement Liqd Take 237  mLs by mouth 3 (three) times daily with meals.   fluticasone 50 MCG/ACT nasal spray Commonly known as:  FLONASE SHAKE LIQUID AND USE 2 SPRAYS IN EACH NOSTRIL DAILY What changed:  See the new instructions.   gabapentin 100 MG capsule Commonly known as:  NEURONTIN Take 2 capsules (200 mg total) by mouth 3 (three) times daily.   LINZESS 290  MCG Caps capsule Generic drug:  linaclotide TAKE 1 CAPSULE(290 MCG) BY MOUTH DAILY What changed:  See the new instructions.   loratadine 10 MG tablet Commonly known as:  CLARITIN Take 1 tablet (10 mg total) by mouth daily.   metoCLOPramide 10 MG tablet Commonly known as:  REGLAN Take 1 tablet (10 mg total) by mouth every 6 (six) hours.   ondansetron 4 MG disintegrating tablet Commonly known as:  ZOFRAN ODT Take 1 tablet (4 mg total) by mouth every 8 (eight) hours as needed for nausea.   ondansetron 4 MG tablet Commonly known as:  ZOFRAN Take 1 tablet (4 mg total) by mouth every 6 (six) hours as needed for nausea.   pantoprazole 40 MG tablet Commonly known as:  PROTONIX Take 1 tablet (40 mg total) by mouth daily.   polyethylene glycol packet Commonly known as:  MIRALAX / GLYCOLAX Take 17 g by mouth 2 (two) times daily.   potassium chloride SA 20 MEQ tablet Commonly known as:  K-DUR,KLOR-CON One tablet once daily by mouth What changed:    how much to take  how to take this  when to take this   pravastatin 20 MG tablet Commonly known as:  PRAVACHOL Take 1 tablet (20 mg total) by mouth daily. What changed:  when to take this   PREMARIN vaginal cream Generic drug:  conjugated estrogens INSERT 1.69 APPLICATORFUL VAGINALLY THREE TIMES WEEKLY AS DIRECTED What changed:  See the new instructions.   progesterone 200 MG capsule Commonly known as:  PROMETRIUM TAKE 1 CAPSULE BY MOUTH EVERY DAY FOR 2 WEEKS EVERY THIRD MONTH: JANUARY, [place] AND OCTOBER What changed:  See the new instructions.   propranolol 10 MG  tablet Commonly known as:  INDERAL Take 1 tablet (10 mg total) by mouth 3 (three) times daily.   risperiDONE 0.5 MG tablet Commonly known as:  RISPERDAL Take 0.5 mg at bedtime by mouth.   senna-docusate 8.6-50 MG tablet Commonly known as:  Senokot-S Take 2 tablets by mouth at bedtime.   sertraline 50 MG tablet Commonly known as:  ZOLOFT Take 50 mg by mouth at bedtime.   sucralfate 1 g tablet Commonly known as:  CARAFATE TAKE 1 TABLET BY MOUTH EVERY MORNING AND AT BEDTIME AS NEEDED FOR STOMACH BURNING( MAY CRUSH 1 TABLET AND MIX IN APPLESAUCE) What changed:  See the new instructions.   SYMBICORT 80-4.5 MCG/ACT inhaler Generic drug:  budesonide-formoterol INHALE 2 PUFFS INTO THE LUNGS TWICE DAILY   SYSTANE OP Place 2 drops into the left eye 3 (three) times daily.   tiZANidine 4 MG tablet Commonly known as:  ZANAFLEX TAKE 1 TABLET BY MOUTH TWICE DAILY FOR BACK OR SPASM What changed:  See the new instructions.   VENTOLIN HFA 108 (90 Base) MCG/ACT inhaler Generic drug:  albuterol INHALE 2 PUFFS INTO THE LUNGS EVERY 6 HOURS AS NEEDED FOR SHORTNESS OF BREATH What changed:  See the new instructions.   zonisamide 50 MG capsule Commonly known as:  ZONEGRAN Take 150 mg by mouth at bedtime.       Major procedures and Radiology Reports - PLEASE review detailed and final reports for all details, in brief -   Dg Chest 2 View  Result Date: 02/16/2018 CLINICAL DATA:  Short of breath EXAM: CHEST - 2 VIEW COMPARISON:  02/08/2018 FINDINGS: The heart size and mediastinal contours are within normal limits. Both lungs are clear. The visualized skeletal structures are unremarkable. IMPRESSION: No active cardiopulmonary disease. Electronically Signed  By: Franchot Gallo M.D.   On: 02/16/2018 16:57   Ct Abdomen Pelvis W Contrast  Result Date: 03/07/2018 CLINICAL DATA:  38 year old female with abdominal pain for 3 weeks. History of gastroparesis. EXAM: CT ABDOMEN AND PELVIS WITH CONTRAST  TECHNIQUE: Multidetector CT imaging of the abdomen and pelvis was performed using the standard protocol following bolus administration of intravenous contrast. CONTRAST:  187mL OMNIPAQUE IOHEXOL 300 MG/ML  SOLN COMPARISON:  CT Abdomen and Pelvis 02/16/2018 and earlier. FINDINGS: Lower chest: Minor lung base atelectasis. No cardiomegaly, pericardial effusion, pleural effusion. Hepatobiliary: Surgically absent gallbladder. Negative liver. Pancreas: Negative. Spleen: Negative. Adrenals/Urinary Tract: Normal adrenal glands. Bilateral renal enhancement and contrast excretion is symmetric and within normal limits. Proximal ureters are decompressed. No perinephric stranding. Unremarkable urinary bladder. Stomach/Bowel: Redundant large bowel with increased retained stool compared to last month, but no large bowel inflammation. There is retained oral contrast in the appendix which remains normal (series 2, image 49). Negative terminal ileum. No dilated small bowel. The stomach is fairly decompressed. The duodenum is stable and within normal limits. No free air, free fluid. Vascular/Lymphatic: Major arterial structures appear patent. There is mild soft atherosclerotic plaque in the distal aorta. Portal venous system is patent. No lymphadenopathy. Reproductive: Surgically absent uterus. Diminutive or absent ovaries. Other: No pelvic free fluid. Musculoskeletal: Negative. IMPRESSION: Increased stool in redundant large bowel since the CT last month, otherwise negative. Electronically Signed   By: Genevie Ann M.D.   On: 03/07/2018 22:49   Ct Abdomen Pelvis W Contrast  Result Date: 02/16/2018 CLINICAL DATA:  Acute generalized abdominal pain. EXAM: CT ABDOMEN AND PELVIS WITH CONTRAST TECHNIQUE: Multidetector CT imaging of the abdomen and pelvis was performed using the standard protocol following bolus administration of intravenous contrast. CONTRAST:  111mL ISOVUE-300 IOPAMIDOL (ISOVUE-300) INJECTION 61% COMPARISON:  CT scan of  March 20, 2017. FINDINGS: Lower chest: No acute abnormality. Hepatobiliary: No focal liver abnormality is seen. Status post cholecystectomy. No biliary dilatation. Pancreas: Unremarkable. No pancreatic ductal dilatation or surrounding inflammatory changes. Spleen: Normal in size without focal abnormality. Adrenals/Urinary Tract: Adrenal glands are unremarkable. Kidneys are normal, without renal calculi, focal lesion, or hydronephrosis. Bladder is unremarkable. Stomach/Bowel: Stomach is within normal limits. Appendix appears normal. No evidence of bowel wall thickening, distention, or inflammatory changes. Vascular/Lymphatic: No significant vascular findings are present. No enlarged abdominal or pelvic lymph nodes. Reproductive: Uterus and bilateral adnexa are unremarkable. Other: No abdominal wall hernia or abnormality. No abdominopelvic ascites. Musculoskeletal: No acute or significant osseous findings. IMPRESSION: No abnormality seen in the abdomen or pelvis. Electronically Signed   By: Marijo Conception, M.D.   On: 02/16/2018 15:25   Dg Abd Acute W/chest  Result Date: 02/22/2018 CLINICAL DATA:  Mid abdominal pain for 1 week, worsening tonight. EXAM: DG ABDOMEN ACUTE W/ 1V CHEST COMPARISON:  Chest radiograph and CT abdomen and pelvis February 16, 2018 FINDINGS: Cardiomediastinal silhouette is normal. Lungs are clear, no pleural effusions. No pneumothorax. Soft tissue planes and included osseous structures are unremarkable. Bowel gas pattern is nondilated and nonobstructive. Small volume retained large bowel stool. Surgical clips in the included right abdomen compatible with cholecystectomy. No intra-abdominal mass effect, pathologic calcifications or free air. Soft tissue planes and included osseous structures are non-suspicious. Mild levoscoliosis may be positional. IMPRESSION: 1. Normal chest radiograph. 2. Normal bowel gas pattern. Electronically Signed   By: Elon Alas M.D.   On: 02/22/2018 01:26      Today   Subjective    Tracey Daniel  today has no new complaints, epigastric discomfort is much better, tolerating oral intake, no further emesis,          Patient has been seen and examined prior to discharge   Objective   Blood pressure 129/74, pulse 64, temperature 98.6 F (37 C), temperature source Oral, resp. rate 16, height 5\' 6"  (1.676 m), weight 65.6 kg, SpO2 100 %.   Intake/Output Summary (Last 24 hours) at 03/11/2018 1128 Last data filed at 03/11/2018 0500 Gross per 24 hour  Intake 1120 ml  Output -  Net 1120 ml    Exam Gen:- Awake Alert, no acute distress  HEENT:- [place].AT, No sclera icterus Neck-Supple Neck,No JVD,.  Lungs-  CTAB , good air movement bilaterally  CV- S1, S2 normal, regular Abd-  +ve B.Sounds, Abd Soft, No tenderness,    Extremity/Skin:- No  edema,   good pulses Psych-affect is appropriate, oriented x3 Neuro-no new focal deficits, no tremors    Data Review   CBC w Diff:  Lab Results  Component Value Date   WBC 6.0 03/08/2018   HGB 11.8 (L) 03/08/2018   HCT 38.2 03/08/2018   PLT 147 (L) 03/08/2018   LYMPHOPCT 26 03/08/2018   MONOPCT 9 03/08/2018   EOSPCT 3 03/08/2018   BASOPCT 0 03/08/2018    CMP:  Lab Results  Component Value Date   NA 139 03/11/2018   K 4.3 03/11/2018   CL 104 03/11/2018   CO2 27 03/11/2018   BUN 19 03/11/2018   CREATININE 1.07 (H) 03/11/2018   CREATININE 0.82 07/30/2017   PROT 7.7 03/07/2018   ALBUMIN 4.7 03/07/2018   BILITOT 0.1 (L) 03/07/2018   ALKPHOS 60 03/07/2018   AST 28 03/07/2018   ALT 36 03/07/2018  .   Total Discharge time is about 33 minutes  Roxan Hockey M.D on 03/11/2018 at 11:28 AM  Go to [email or web address] -  for contact info  [facility] - Office  [phone]

## 2018-03-11 NOTE — Progress Notes (Signed)
IV removed, follow up appts made and scripts given.  To call for ride home

## 2018-03-11 NOTE — Addendum Note (Signed)
Addendum  created 03/11/18 0731 by Vista Deck, CRNA   Intraprocedure Flowsheets edited

## 2018-03-11 NOTE — Progress Notes (Addendum)
REVIEWED-NO ADDITIONAL RECOMMENDATIONS.   Subjective: Feeling better today.  She notes some residual abdominal pain, but better than yesterday.  Nausea significantly improved.  She is keeping down the diet currently.  No other GI complaints.  No signs of any GI bleed.  Objective: Vital signs in last 24 hours: Temp:  [97.6 F (36.4 C)-98.6 F (37 C)] 98.6 F (37 C) (02/20 2145) Pulse Rate:  [54-68] 59 (02/20 2145) Resp:  [16-20] 16 (02/20 2145) BP: (114-128)/(74-86) 123/74 (02/20 2145) SpO2:  [98 %-100 %] 100 % (02/21 0807) Weight:  [65.6 kg] 65.6 kg (02/21 0500) Last BM Date: 03/10/18 General:   Alert and oriented, pleasant Head:  Normocephalic and atraumatic. Eyes:  No icterus, sclera clear. Conjuctiva pink.  Heart:  S1, S2 present, no murmurs noted.  Lungs: Clear to auscultation bilaterally, without wheezing, rales, or rhonchi.  Abdomen:  Bowel sounds present, soft, non-tender, non-distended. No HSM or hernias noted. No rebound or guarding. No masses appreciated  Msk:  Symmetrical without gross deformities. Normal posture. Pulses:  Normal bilateral DP pulses noted. Extremities:  Without clubbing or edema. Neurologic:  Alert and  oriented x4;  grossly normal neurologically. Psych:  Alert and cooperative. Normal mood and affect.  Intake/Output from previous day: 02/20 0701 - 02/21 0700 In: 1120 [P.O.:720; I.V.:400] Out: -  Intake/Output this shift: No intake/output data recorded.  Lab Results: No results for input(s): WBC, HGB, HCT, PLT in the last 72 hours. BMET Recent Labs    03/11/18 0555  NA 139  K 4.3  CL 104  CO2 27  GLUCOSE 95  BUN 19  CREATININE 1.07*  CALCIUM 10.0   LFT No results for input(s): PROT, ALBUMIN, AST, ALT, ALKPHOS, BILITOT, BILIDIR, IBILI in the last 72 hours. PT/INR No results for input(s): LABPROT, INR in the last 72 hours. Hepatitis Panel No results for input(s): HEPBSAG, HCVAB, HEPAIGM, HEPBIGM in the last 72  hours.   Studies/Results: No results found.  Assessment: 38 year old female with acute on chronic abdominal pain worsening over the past several weeks, associated with nausea and vomiting, early satiety, symptoms worse postprandially.  She has a history of pyloric channel spasms requiring Botox historically (first injection in 12/2010, 01/2013, 10/2015), last EGD in October 2017.  CT abdomen pelvis with contrast 03/07/2018 without acute findings however increased stool load since January study. Abdominal pain seemed to intensify after starting diclofenac as an outpatient without PPI recently. Cannot rule out gastritis/PUD, recurrent pyloric channel spasms. Constipation may be playing a role as well. Use of reglan intermittently at home with prior abnormal GES 2012 (prior to receiving botox injections).  Due to continued epigastric pain, dry heaves with clear liquids and taking medications, it was decided she undergo EGD with propofol for possible pyloric channel dilatation and +/- botox.  Her EGD was completed and found a normal esophagus, elongated and dilated stomach likely stigmata of slow gastric emptying due to narrowing of the pyloric channel, pyloric channel status post balloon dilation.  Also noted small hiatal hernia and normal duodenum.  Recommended low residue, low-fat diet, continue daily PPI.  If symptomatic stenosis recurs would consider surgical pyloroplasty.  Today she is on a heart healthy diet.  She seems clinically improved, nausea is improved/essentially resolved.  Abdominal pain is significantly improved but mildly persistent.  Discussed possibility of surgical pyloroplasty if stenosis recurs and she seems to be on board with this.  Continue heart healthy diet.  Plan: 1. Continue daily PPI 2. Pain and nausea management as per  hospitalist 3. Will need 4 to 6-week follow-up as an outpatient 4. Supportive measures  Thank you for allowing [place] to participate in the care of [place], DNP, AGNP-C Adult & Gerontological Nurse Practitioner [facility]     LOS: 0 days    03/11/2018, 9:03 AM

## 2018-03-11 NOTE — Discharge Instructions (Signed)
1) you have cannabinoid hyperemesis syndrome----usually caused by daily use of marijuana----complete cessation of marijuana/cannabis use strongly advised 2) you have pyloric stenosis/narrowing of the end part of your stomach--- take antinausea medication as advised low-fat low residue diet advised, drink plenty fluids, avoid constipation 3)Avoid ibuprofen/Advil/Aleve/Motrin/Goody Powders/Naproxen/BC powders/Meloxicam/Diclofenac/Indomethacin and other Nonsteroidal anti-inflammatory medications as these will make you more likely to bleed and can cause stomach ulcers, can also cause Kidney problems.  4) please take your medications as prescribed

## 2018-03-14 ENCOUNTER — Encounter: Payer: Self-pay | Admitting: Internal Medicine

## 2018-03-14 ENCOUNTER — Encounter (HOSPITAL_COMMUNITY): Payer: Self-pay | Admitting: Internal Medicine

## 2018-03-14 ENCOUNTER — Ambulatory Visit (INDEPENDENT_AMBULATORY_CARE_PROVIDER_SITE_OTHER): Payer: Medicare HMO | Admitting: Orthopaedic Surgery

## 2018-03-21 ENCOUNTER — Other Ambulatory Visit: Payer: Self-pay | Admitting: Family Medicine

## 2018-03-22 DIAGNOSIS — G518 Other disorders of facial nerve: Secondary | ICD-10-CM | POA: Diagnosis not present

## 2018-03-22 DIAGNOSIS — M542 Cervicalgia: Secondary | ICD-10-CM | POA: Diagnosis not present

## 2018-03-22 DIAGNOSIS — G43719 Chronic migraine without aura, intractable, without status migrainosus: Secondary | ICD-10-CM | POA: Diagnosis not present

## 2018-03-22 DIAGNOSIS — M791 Myalgia, unspecified site: Secondary | ICD-10-CM | POA: Diagnosis not present

## 2018-03-24 ENCOUNTER — Other Ambulatory Visit: Payer: Self-pay

## 2018-03-24 MED ORDER — TIZANIDINE HCL 4 MG PO TABS: 4.0000 mg | ORAL_TABLET | Freq: Two times a day (BID) | ORAL | 0 refills | Status: DC | PRN

## 2018-03-31 ENCOUNTER — Ambulatory Visit: Payer: Medicare HMO | Admitting: Nurse Practitioner

## 2018-04-20 ENCOUNTER — Other Ambulatory Visit: Payer: Self-pay

## 2018-04-20 ENCOUNTER — Encounter: Payer: Self-pay | Admitting: Internal Medicine

## 2018-04-20 ENCOUNTER — Ambulatory Visit (INDEPENDENT_AMBULATORY_CARE_PROVIDER_SITE_OTHER): Payer: Medicare HMO | Admitting: Gastroenterology

## 2018-04-20 ENCOUNTER — Encounter: Payer: Self-pay | Admitting: Gastroenterology

## 2018-04-20 DIAGNOSIS — K313 Pylorospasm, not elsewhere classified: Secondary | ICD-10-CM | POA: Diagnosis not present

## 2018-04-20 DIAGNOSIS — K5900 Constipation, unspecified: Secondary | ICD-10-CM

## 2018-04-20 NOTE — Patient Instructions (Signed)
Continue Protonix once a day.   Continue Linzess daily.  Please call if you start losing weight, have abdominal pain, nausea, or vomiting.  We will see you in 4 months!  I enjoyed seeing you again today! As you know, I value our relationship and want to provide genuine, compassionate, and quality care. I welcome your feedback. If you receive a survey regarding your visit,  I greatly appreciate you taking time to fill this out. See you next time!  Annitta Needs, PhD, ANP-BC [facility]

## 2018-04-20 NOTE — Progress Notes (Signed)
Primary Care Physician:  [name], MD  Primary GI: Dr. Gala Romney   Virtual Visit via Telephone Note Due to COVID-19, visit is conducted virtually and was requested by patient.   I connected with Tracey Daniel on 04/20/18 at  9:00 AM EDT by telephone and verified that I am speaking with the correct person using two identifiers.   I discussed the limitations, risks, security and privacy concerns of performing an evaluation and management service by telephone and the availability of in person appointments. I also discussed with the patient that there may be a patient responsible charge related to this service. The patient expressed understanding and agreed to proceed.  Chief Complaint  Patient presents with  . Follow-up  . Abdominal Pain    doing better     History of Present Illness:   38 year old female with history of abdominal pain, N/V, GERD, constipation, pylorospasms in past requiring Botox injections 2017. Recently inpatient Feb 2020 with acute on chronic abdominal pain, N/V. EGD completed while inpatient with balloon dilatation of pyloric channel.   Eating smaller amounts and not hungry. Recent weight at home was 146. At office visit Feb 2020 was 149. Abdominal pain improved. No nausea. No vomiting. Only tylenol products. No lower GI signs/symptoms. Ensure TID. Linzess daily. Protonix daily.    Past Medical History:  Diagnosis Date  . Anemia of other chronic disease 11/29/2012  . Asthma   . Back pain, chronic   . Chronic abdominal pain   . Constipation   . COPD (chronic obstructive pulmonary disease) ([place])   . Cyst of skin    mid spine area  . Depression 2000   h/o suicidal ideation  . Gastric outlet obstruction   . Gastritis   . Gastroparesis   . Migraine   . Migraines   . Nausea and vomiting    recurrent  . Nicotine addiction   . Osteoporosis   . Peptic ulcer disease   . Pyloric spasm 03/30/2011  . Seasonal allergies   . Sinusitis   .  Substance abuse ([place]) 2008   marijuana  . Vitamin D deficiency      Past Surgical History:  Procedure Laterality Date  . BALLOON DILATION N/A 02/09/2013   Procedure: BALLOON DILATION;  Surgeon: Missy Sabins, MD;  [facility];  Service: Endoscopy;  Laterality: N/A;  . BALLOON DILATION N/A 03/10/2018   Procedure: BALLOON DILATION;  Surgeon: Daneil Dolin, MD;  [facility];  Service: Endoscopy;  Laterality: N/A;  . BOTOX INJECTION N/A 02/09/2013   Procedure: BOTOX INJECTION;  Surgeon: Missy Sabins, MD;  [facility];  Service: Endoscopy;  Laterality: N/A;  possible balloon  . BOTOX INJECTION N/A 10/24/2015   Procedure: BOTOX INJECTION;  Surgeon: Daneil Dolin, MD;  [facility];  Service: Endoscopy;  Laterality: N/A;  . BOTOX INJECTION N/A 03/10/2018   Procedure: BOTOX INJECTION;  Surgeon: Daneil Dolin, MD;  [facility];  Service: Endoscopy;  Laterality: N/A;  Melanie notified  . CARPAL TUNNEL RELEASE     left hand  . CHOLECYSTECTOMY     ?2002  . COLONOSCOPY WITH PROPOFOL N/A 09/20/2014   RMR: Normal ileo-colonoscopy  . ESOPHAGEAL DILATION  12/25/2010   Procedure: ESOPHAGEAL DILATION;  Surgeon: Dorothyann Peng, MD;  [facility];  Service: Endoscopy;;  . ESOPHAGOGASTRODUODENOSCOPY  12/25/2010   Dorothyann Peng, MD;  moderate gastritis, ?goo secondary to pylorspasm. BX showed reactive  gstropathy no h.pyori, SB mucosa with intramucosal lymphocytosis and partial villous blunting (TTG 4.0 normal)  . ESOPHAGOGASTRODUODENOSCOPY N/A 11/14/2012   ATF:TDDUK hiatal hernia. Abnormal gastric mucosa of  uncertain significance-status post biopsy. Subjectively, patient may have recurrent symptomatic, pylorospasm.  . ESOPHAGOGASTRODUODENOSCOPY (EGD) WITH PROPOFOL N/A 02/09/2013   elongated stomach, partial lower esophageal ring widely patent. No obvious pyloric stenosis s/p Botox  . ESOPHAGOGASTRODUODENOSCOPY (EGD) WITH PROPOFOL N/A 10/24/2015    Procedure: ESOPHAGOGASTRODUODENOSCOPY (EGD) WITH PROPOFOL;  Surgeon: Daneil Dolin, MD;  [facility];  Service: Endoscopy;  Laterality: N/A;  830   . ESOPHAGOGASTRODUODENOSCOPY (EGD) WITH PROPOFOL N/A 03/10/2018   Dr. Gala Romney: normal esophagus, elongated, dilated stomach likely stigmata of slow gastric emptying due to narrowing of pyloric channel, s/p balloon dilatation of pyloric channel. Small hiatal hernia, normal duodenal bulb and second portion of duodenum  . laser surgery on cervix    . NASAL SEPTUM SURGERY  01/29/2017  . TOE SURGERY       Current Meds  Medication Sig  . acetaminophen (TYLENOL) 325 MG tablet Take 2 tablets (650 mg total) by mouth every 6 (six) hours as needed for mild pain (or Fever >/= 101).  Marland Kitchen azelastine (ASTELIN) 0.1 % nasal spray USE 2 SPRAYS IN EACH NOSTRIL TWICE DAILY AS DIRECTED (Patient taking differently: Place 2 sprays into both nostrils 2 (two) times daily. )  . BIOTIN PO Take 1 tablet by mouth daily.  . calcium-vitamin D (OSCAL WITH D) 500-200 MG-UNIT per tablet Take 1 tablet by mouth 2 (two) times daily.  . chlorhexidine (PERIDEX) 0.12 % solution 5 mLs by Mouth Rinse route every morning.   . clobetasol cream (TEMOVATE) 0.25 % Apply 1 application topically 2 (two) times daily as needed (rash). Apply to rash  . dicyclomine (BENTYL) 20 MG tablet Take 1 tablet (20 mg total) by mouth 2 (two) times daily.  Marland Kitchen estradiol (ESTRACE) 0.5 MG tablet Take 1 tablet (0.5 mg total) by mouth daily.  Marland Kitchen EVENING PRIMROSE OIL PO Take 1 tablet by mouth 2 (two) times daily.   . feeding supplement (ENSURE CLINICAL STRENGTH) LIQD Take 237 mLs by mouth 3 (three) times daily with meals.  . fluticasone (FLONASE) 50 MCG/ACT nasal spray SHAKE LIQUID AND USE 2 SPRAYS IN EACH NOSTRIL DAILY (Patient taking differently: Place 2 sprays into both nostrils daily. )  . gabapentin (NEURONTIN) 100 MG capsule Take 2 capsules (200 mg total) by mouth 3 (three) times daily.  Marland Kitchen LINZESS 290  MCG CAPS capsule TAKE 1 CAPSULE(290 MCG) BY MOUTH DAILY (Patient taking differently: Take 290 mcg by mouth daily. )  . loratadine (CLARITIN) 10 MG tablet Take 1 tablet (10 mg total) by mouth daily.  . metoCLOPramide (REGLAN) 10 MG tablet Take 1 tablet (10 mg total) by mouth every 6 (six) hours.  . ondansetron (ZOFRAN ODT) 4 MG disintegrating tablet Take 1 tablet (4 mg total) by mouth every 8 (eight) hours as needed for nausea.  . ondansetron (ZOFRAN) 4 MG tablet Take 1 tablet (4 mg total) by mouth every 6 (six) hours as needed for nausea.  . pantoprazole (PROTONIX) 40 MG tablet Take 1 tablet (40 mg total) by mouth daily.  Vladimir Faster Glycol-Propyl Glycol (SYSTANE OP) Place 2 drops into the left eye 3 (three) times daily.  . polyethylene glycol (MIRALAX / GLYCOLAX) packet Take 17 g by mouth 2 (two) times daily.  . potassium chloride SA (K-DUR,KLOR-CON) 20 MEQ tablet One tablet once daily by mouth (Patient taking differently: Take  20 mEq by mouth daily. One tablet once daily by mouth)  . pravastatin (PRAVACHOL) 20 MG tablet Take 1 tablet (20 mg total) by mouth daily.  Marland Kitchen PREMARIN vaginal cream INSERT 4.07 APPLICATORFUL VAGINALLY THREE TIMES WEEKLY AS DIRECTED (Patient taking differently: Place 6.80 Applicatorfuls vaginally 3 (three) times a week. )  . progesterone (PROMETRIUM) 200 MG capsule TAKE 1 CAPSULE BY MOUTH EVERY DAY FOR 2 WEEKS EVERY THIRD MONTH: JANUARY, APRIL, [place] OCTOBER (Patient taking differently: Take 200 mg by mouth See admin instructions. TAKE 1 CAPSULE BY MOUTH EVERY DAY FOR 2 WEEKS EVERY THIRD MONTH: [place], APRIL, [place] AND OCTOBER)  . propranolol (INDERAL) 10 MG tablet Take 1 tablet (10 mg total) by mouth 3 (three) times daily.  . risperiDONE (RISPERDAL) 0.5 MG tablet Take 0.5 mg at bedtime by mouth.  . senna-docusate (SENOKOT-S) 8.6-50 MG tablet Take 2 tablets by mouth at bedtime.  . sertraline (ZOLOFT) 50 MG tablet Take 50 mg by mouth at bedtime.   . sucralfate (CARAFATE) 1 g  tablet TAKE 1 TABLET BY MOUTH EVERY MORNING AND AT BEDTIME AS NEEDED FOR STOMACH BURNING( MAY CRUSH 1 TABLET AND MIX IN APPLESAUCE) (Patient taking differently: Take 1 g by mouth 2 (two) times daily as needed (Stomach burning (may crush one tablet and mix in applesauce.)). )  . SYMBICORT 80-4.5 MCG/ACT inhaler INHALE 2 PUFFS INTO THE LUNGS TWICE DAILY (Patient taking differently: Inhale 2 puffs into the lungs 2 (two) times daily. )  . tiZANidine (ZANAFLEX) 4 MG tablet Take 1 tablet (4 mg total) by mouth 2 (two) times daily as needed (back or spasms).  . VENTOLIN HFA 108 (90 Base) MCG/ACT inhaler INHALE 2 PUFFS INTO THE LUNGS EVERY 6 HOURS AS NEEDED FOR SHORTNESS OF BREATH (Patient taking differently: Inhale 2 puffs into the lungs every 6 (six) hours as needed (shortness of breath.). )  . zonisamide (ZONEGRAN) 50 MG capsule Take 150 mg by mouth at bedtime.        Observations/Objective: No distress. Unable to perform physical exam due to telephone encounter. No video available.   Assessment and Plan: 38 year old female with history of pyloric spasms and stenosis s/p Botox in past and most recently dilatation, doing well clinically post-hospitalization in Feb 2020. Abdominal pain, N/V resolved. Weight is overall stable. Early satiety noted but no other alarm features. Discussed frequent meals throughout the day, continue PPI daily, and call if weight loss, inability to tolerate diet, N/V.   Constipation well-controlled with Linzess 290 mcg daily.    Follow Up Instructions: Return in 4 months for face-to-face visit Continue Linzess and PPI Call if change in symptoms.    I discussed the assessment and treatment plan with the patient. The patient was provided an opportunity to ask questions and all were answered. The patient agreed with the plan and demonstrated an understanding of the instructions.   The patient was advised to call back or seek an in-person evaluation if the symptoms worsen or if  the condition fails to improve as anticipated.  I provided 10 minutes of non-face-to-face time during this encounter.  Annitta Needs, PhD, ANP-BC [facility]

## 2018-04-20 NOTE — Progress Notes (Signed)
cc'ed to pcp °

## 2018-04-25 DIAGNOSIS — L920 Granuloma annulare: Secondary | ICD-10-CM | POA: Diagnosis not present

## 2018-04-25 DIAGNOSIS — L722 Steatocystoma multiplex: Secondary | ICD-10-CM | POA: Diagnosis not present

## 2018-04-26 DIAGNOSIS — G43719 Chronic migraine without aura, intractable, without status migrainosus: Secondary | ICD-10-CM | POA: Diagnosis not present

## 2018-04-26 DIAGNOSIS — Z79899 Other long term (current) drug therapy: Secondary | ICD-10-CM | POA: Diagnosis not present

## 2018-04-26 DIAGNOSIS — G518 Other disorders of facial nerve: Secondary | ICD-10-CM | POA: Diagnosis not present

## 2018-04-26 DIAGNOSIS — M542 Cervicalgia: Secondary | ICD-10-CM | POA: Diagnosis not present

## 2018-04-26 DIAGNOSIS — M791 Myalgia, unspecified site: Secondary | ICD-10-CM | POA: Diagnosis not present

## 2018-05-04 ENCOUNTER — Ambulatory Visit: Payer: Medicare HMO | Admitting: Nurse Practitioner

## 2018-05-24 ENCOUNTER — Other Ambulatory Visit: Payer: Self-pay | Admitting: Family Medicine

## 2018-05-28 ENCOUNTER — Other Ambulatory Visit: Payer: Self-pay

## 2018-05-28 ENCOUNTER — Emergency Department (HOSPITAL_COMMUNITY)
Admission: EM | Admit: 2018-05-28 | Discharge: 2018-05-28 | Disposition: A | Discharge status: Home / Self Care | Payer: Medicare HMO | Attending: Emergency Medicine | Admitting: Emergency Medicine

## 2018-05-28 ENCOUNTER — Encounter (HOSPITAL_COMMUNITY): Payer: Self-pay

## 2018-05-28 ENCOUNTER — Emergency Department (HOSPITAL_COMMUNITY): Payer: Medicare HMO

## 2018-05-28 DIAGNOSIS — F17210 Nicotine dependence, cigarettes, uncomplicated: Secondary | ICD-10-CM | POA: Diagnosis not present

## 2018-05-28 DIAGNOSIS — R1013 Epigastric pain: Secondary | ICD-10-CM

## 2018-05-28 DIAGNOSIS — R197 Diarrhea, unspecified: Secondary | ICD-10-CM | POA: Diagnosis not present

## 2018-05-28 DIAGNOSIS — R112 Nausea with vomiting, unspecified: Secondary | ICD-10-CM

## 2018-05-28 DIAGNOSIS — R109 Unspecified abdominal pain: Secondary | ICD-10-CM | POA: Diagnosis not present

## 2018-05-28 DIAGNOSIS — J449 Chronic obstructive pulmonary disease, unspecified: Secondary | ICD-10-CM | POA: Insufficient documentation

## 2018-05-28 DIAGNOSIS — K311 Adult hypertrophic pyloric stenosis: Secondary | ICD-10-CM | POA: Insufficient documentation

## 2018-05-28 LAB — COMPREHENSIVE METABOLIC PANEL
ALT: 17 U/L (ref 0–44)
AST: 22 U/L (ref 15–41)
Albumin: 5 g/dL (ref 3.5–5.0)
Alkaline Phosphatase: 77 U/L (ref 38–126)
Anion gap: 14 (ref 5–15)
BUN: 16 mg/dL (ref 6–20)
CO2: 20 mmol/L — ABNORMAL LOW (ref 22–32)
Calcium: 10.1 mg/dL (ref 8.9–10.3)
Chloride: 104 mmol/L (ref 98–111)
Creatinine, Ser: 0.89 mg/dL (ref 0.44–1.00)
GFR calc Af Amer: >60 mL/min (ref >60)
GFR calc non Af Amer: >60 mL/min (ref >60)
Glucose, Bld: 130 mg/dL — ABNORMAL HIGH (ref 70–99)
Potassium: 3.6 mmol/L (ref 3.5–5.1)
Sodium: 138 mmol/L (ref 135–145)
Total Bilirubin: 0.7 mg/dL (ref 0.3–1.2)
Total Protein: 7.9 g/dL (ref 6.5–8.1)

## 2018-05-28 LAB — URINALYSIS, ROUTINE W REFLEX MICROSCOPIC
Bilirubin Urine: NEGATIVE
Glucose, UA: NEGATIVE mg/dL
Hgb urine dipstick: NEGATIVE
Ketones, ur: 20 mg/dL — AB
Leukocytes,Ua: NEGATIVE
Nitrite: NEGATIVE
Protein, ur: NEGATIVE mg/dL
Specific Gravity, Urine: 1.015 (ref 1.005–1.030)
pH: 8 (ref 5.0–8.0)

## 2018-05-28 LAB — CBC
HCT: 37.7 % (ref 36.0–46.0)
Hemoglobin: 12.4 g/dL (ref 12.0–15.0)
MCH: 30.8 pg (ref 26.0–34.0)
MCHC: 32.9 g/dL (ref 30.0–36.0)
MCV: 93.5 fL (ref 80.0–100.0)
Platelets: 194 10*3/uL (ref 150–400)
RBC: 4.03 MIL/uL (ref 3.87–5.11)
RDW: 13.7 % (ref 11.5–15.5)
WBC: 14.5 10*3/uL — ABNORMAL HIGH (ref 4.0–10.5)
nRBC: 0 % (ref 0.0–0.2)

## 2018-05-28 LAB — I-STAT BETA HCG BLOOD, ED (MC, WL, AP ONLY): I-stat hCG, quantitative: <5 m[IU]/mL (ref <5)

## 2018-05-28 LAB — LIPASE, BLOOD: Lipase: 25 U/L (ref 11–51)

## 2018-05-28 LAB — LACTIC ACID, PLASMA: Lactic Acid, Venous: 2.3 mmol/L (ref 0.5–1.9)

## 2018-05-28 MED ORDER — HYDROMORPHONE HCL 1 MG/ML IJ SOLN
1.0000 mg | Freq: Once | INTRAMUSCULAR | Status: AC
  Administered 2018-05-28: 19:00:00 1 mg via INTRAVENOUS
  Filled 2018-05-28: qty 1

## 2018-05-28 MED ORDER — SODIUM CHLORIDE 0.9 % IV BOLUS
1000.0000 mL | Freq: Once | INTRAVENOUS | Status: AC
  Administered 2018-05-28: 18:00:00 1000 mL via INTRAVENOUS

## 2018-05-28 MED ORDER — HALOPERIDOL LACTATE 5 MG/ML IJ SOLN
2.0000 mg | Freq: Once | INTRAMUSCULAR | Status: AC
  Administered 2018-05-28: 2 mg via INTRAVENOUS
  Filled 2018-05-28: qty 1

## 2018-05-28 MED ORDER — SODIUM CHLORIDE 0.9 % IV BOLUS
1000.0000 mL | Freq: Once | INTRAVENOUS | Status: AC
  Administered 2018-05-28: 1000 mL via INTRAVENOUS

## 2018-05-28 MED ORDER — ONDANSETRON HCL 4 MG/2ML IJ SOLN
4.0000 mg | Freq: Once | INTRAMUSCULAR | Status: AC
  Administered 2018-05-28: 4 mg via INTRAVENOUS
  Filled 2018-05-28: qty 2

## 2018-05-28 NOTE — ED Notes (Signed)
istat hcg in process,  Nurse aware.

## 2018-05-28 NOTE — ED Provider Notes (Signed)
[facility] Emergency Department Provider Note MRN:  [id number]  Arrival date & time: 05/28/18     Chief Complaint   Abdominal Pain and Emesis   History of Present Illness   Tracey Daniel is a 38 y.o. year-old female with a history of COPD, pyloric stenosis, marijuana hyperemesis syndrome presenting to the ED with chief complaint of abdominal pain and emesis.  2 to 3 weeks of intermittent mild epigastric and left upper quadrant discomfort accompanied by frequent belching, nausea, intermittent vomiting.  Symptoms became much worse this morning, now severe, constant.  Denies lower abdominal pain.  Endorsing occasional diarrhea, which is common for her flares.  Denies fever, no chest pain or shortness of breath.  No other exacerbating relieving factors.  Nausea and 10+ episodes of nonbloody nonbilious emesis.  Review of Systems  A complete 10 system review of systems was obtained and all systems are negative except as noted in the HPI and PMH.   Patient's Health History    Past Medical History:  Diagnosis Date  . Anemia of other chronic disease 11/29/2012  . Asthma   . Back pain, chronic   . Chronic abdominal pain   . Constipation   . COPD (chronic obstructive pulmonary disease) ([place])   . Cyst of skin    mid spine area  . Depression 2000   h/o suicidal ideation  . Gastric outlet obstruction   . Gastritis   . Gastroparesis   . Migraine   . Migraines   . Nausea and vomiting    recurrent  . Nicotine addiction   . Osteoporosis   . Peptic ulcer disease   . Pyloric spasm 03/30/2011  . Seasonal allergies   . Sinusitis   . Substance abuse ([place]) 2008   marijuana  . Vitamin D deficiency     Past Surgical History:  Procedure Laterality Date  . BALLOON DILATION N/A 02/09/2013   Procedure: BALLOON DILATION;  Surgeon: Missy Sabins, MD;  [facility];  Service: Endoscopy;  Laterality: N/A;  . BALLOON DILATION N/A 03/10/2018   Procedure: BALLOON  DILATION;  Surgeon: Daneil Dolin, MD;  [facility];  Service: Endoscopy;  Laterality: N/A;  . BOTOX INJECTION N/A 02/09/2013   Procedure: BOTOX INJECTION;  Surgeon: Missy Sabins, MD;  [facility];  Service: Endoscopy;  Laterality: N/A;  possible balloon  . BOTOX INJECTION N/A 10/24/2015   Procedure: BOTOX INJECTION;  Surgeon: Daneil Dolin, MD;  [facility];  Service: Endoscopy;  Laterality: N/A;  . BOTOX INJECTION N/A 03/10/2018   Procedure: BOTOX INJECTION;  Surgeon: Daneil Dolin, MD;  [facility];  Service: Endoscopy;  Laterality: N/A;  Melanie notified  . CARPAL TUNNEL RELEASE     left hand  . CHOLECYSTECTOMY     ?2002  . COLONOSCOPY WITH PROPOFOL N/A 09/20/2014   RMR: Normal ileo-colonoscopy  . ESOPHAGEAL DILATION  12/25/2010   Procedure: ESOPHAGEAL DILATION;  Surgeon: Dorothyann Peng, MD;  [facility];  Service: Endoscopy;;  . ESOPHAGOGASTRODUODENOSCOPY  12/25/2010   Dorothyann Peng, MD;  moderate gastritis, ?goo secondary to pylorspasm. BX showed reactive gstropathy no h.pyori, SB mucosa with intramucosal lymphocytosis and partial villous blunting (TTG 4.0 normal)  . ESOPHAGOGASTRODUODENOSCOPY N/A 11/14/2012   WGN:FAOZH hiatal hernia. Abnormal gastric mucosa of  uncertain significance-status post biopsy. Subjectively, patient may have recurrent symptomatic, pylorospasm.  . ESOPHAGOGASTRODUODENOSCOPY (EGD) WITH PROPOFOL N/A 02/09/2013   elongated stomach, partial lower esophageal  ring widely patent. No obvious pyloric stenosis s/p Botox  . ESOPHAGOGASTRODUODENOSCOPY (EGD) WITH PROPOFOL N/A 10/24/2015   Procedure: ESOPHAGOGASTRODUODENOSCOPY (EGD) WITH PROPOFOL;  Surgeon: Daneil Dolin, MD;  [facility];  Service: Endoscopy;  Laterality: N/A;  830   . ESOPHAGOGASTRODUODENOSCOPY (EGD) WITH PROPOFOL N/A 03/10/2018   Dr. Gala Romney: normal esophagus, elongated, dilated stomach likely stigmata of slow gastric emptying due to  narrowing of pyloric channel, s/p balloon dilatation of pyloric channel. Small hiatal hernia, normal duodenal bulb and second portion of duodenum  . laser surgery on cervix    . NASAL SEPTUM SURGERY  01/29/2017  . TOE SURGERY      Family History  Problem Relation Age of Onset  . Diabetes Father   . Liver disease Father        liver transplant at [facility], age 58  . Lung cancer Mother 35  . Heart disease Maternal Grandmother   . Parkinson's disease Maternal Grandfather   . Multiple sclerosis Sister 38  . Depression Sister 36  . Alcohol abuse Sister   . Bipolar disorder Sister   . Schizophrenia Sister   . Colon cancer Neg Hx     Social History   Socioeconomic History  . Marital status: Significant Other    Spouse name: Not on file  . Number of children: 0  . Years of education: 9  . Highest education level: 9th grade  Occupational History  . Occupation: disability    Employer: UNEMPLOYED  Social Needs  . Financial resource strain: Not very hard  . Food insecurity:    Worry: Sometimes true    Inability: Sometimes true  . Transportation needs:    Medical: No    Non-medical: No  Tobacco Use  . Smoking status: Current Every Day Smoker    Packs/day: 0.10    Years: 15.00    Pack years: 1.50    Types: Cigarettes  . Smokeless tobacco: Never Used  . Tobacco comment: 2 per day  Substance and Sexual Activity  . Alcohol use: No    Alcohol/week: 0.0 standard drinks  . Drug use: Yes    Types: Marijuana  . Sexual activity: Yes    Birth control/protection: None  Lifestyle  . Physical activity:    Days per week: 3 days    Minutes per session: 30 min  . Stress: Not at all  Relationships  . Social connections:    Talks on phone: More than three times a week    Gets together: Twice a week    Attends religious service: Never    Active member of club or organization: No    Attends meetings of clubs or organizations: Never    Relationship status: Living with partner  . Intimate  partner violence:    Fear of current or ex partner: No    Emotionally abused: No    Physically abused: No    Forced sexual activity: No  Other Topics Concern  . Not on file  Social History Narrative  . Not on file     Physical Exam  Vital Signs and Nursing Notes reviewed Vitals:   05/28/18 1930 05/28/18 1945  BP: 116/74 115/72  Pulse: (!) 46 (!) 46  Resp: 10 (!) 23  Temp:    SpO2: 100% 100%    CONSTITUTIONAL: Well-appearing, in moderate distress due to discomfort NEURO:  Alert and oriented x 3, no focal deficits EYES:  eyes equal and reactive ENT/NECK:  no LAD, no JVD CARDIO: Regular rate,  well-perfused, normal S1 and S2 PULM:  CTAB no wheezing or rhonchi GI/GU:  normal bowel sounds, non-distended, non-tender MSK/SPINE:  No gross deformities, no edema SKIN:  no rash, atraumatic PSYCH:  Appropriate speech and behavior  Diagnostic and Interventional Summary    EKG Interpretation  Date/Time:  Saturday May 28 2018 17:50:59 EDT Ventricular Rate:  68 PR Interval:    QRS Duration: 104 QT Interval:  383 QTC Calculation: 408 R Axis:   90 Text Interpretation:  Sinus rhythm Borderline right axis deviation Repol abnrm suggests ischemia, anterolateral Baseline wander in lead(s) I III aVL aVF V2 V3 V4 V5 V6 Confirmed by Gerlene Fee [phone]) on 05/28/2018 6:22:23 PM      Labs Reviewed  CBC - Abnormal; Notable for the following components:      Result Value   WBC 14.5 (*)    All other components within normal limits  COMPREHENSIVE METABOLIC PANEL - Abnormal; Notable for the following components:   CO2 20 (*)    Glucose, Bld 130 (*)    All other components within normal limits  URINALYSIS, ROUTINE W REFLEX MICROSCOPIC - Abnormal; Notable for the following components:   Ketones, ur 20 (*)    All other components within normal limits  LACTIC ACID, PLASMA - Abnormal; Notable for the following components:   Lactic Acid, Venous 2.3 (*)    All other components within normal  limits  LIPASE, BLOOD  I-STAT BETA HCG BLOOD, ED ([facility], [facility], [facility] ONLY)    DG ABD ACUTE 2+V W 1V CHEST  Final Result      Medications  sodium chloride 0.9 % bolus 1,000 mL (0 mLs Intravenous Stopped 05/28/18 2050)  sodium chloride 0.9 % bolus 1,000 mL (0 mLs Intravenous Stopped 05/28/18 1916)  haloperidol lactate (HALDOL) injection 2 mg (2 mg Intravenous Given 05/28/18 1811)  ondansetron (ZOFRAN) injection 4 mg (4 mg Intravenous Given 05/28/18 1831)  HYDROmorphone (DILAUDID) injection 1 mg (1 mg Intravenous Given 05/28/18 1831)     Procedures Critical Care  ED Course and Medical Decision Making  I have reviewed the triage vital signs and the nursing notes.  Pertinent labs & imaging results that were available during my care of the patient were reviewed by me and considered in my medical decision making (see below for details).  Vital signs are normal, afebrile, abdomen is soft and nontender, but patient is in distress consistent with her prior presentations of cyclical vomiting/marijuana hyperemesis and/or gastric outlet obstruction related to pyloric stenosis and/or slow gastric emptying.  Will provide Haldol, fluids, obtain labs, screening x-ray, reassess.  Patient is feeling much better after medications listed above, sleeping peacefully.  Labs reveal signs of mild dehydration, x-ray is without concern for obstruction.  Abdomen on reevaluation is soft and nontender.  Patient was hydrated here in the emergency department, tolerating p.o., appropriate for discharge and follow-up with her GI specialist.  After the discussed management above, the patient was determined to be safe for discharge.  The patient was in agreement with this plan and all questions regarding their care were answered.  ED return precautions were discussed and the patient will return to the ED with any significant worsening of condition.  Barth Kirks. Sedonia Small, [place]  [email or web address]  Final Clinical Impressions(s) / ED Diagnoses     ICD-10-CM   1. Nausea vomiting and diarrhea R11.2    R19.7   2. Pyloric stenosis in adult K31.1 DG ABD ACUTE 2+V W 1V CHEST  DG ABD ACUTE 2+V W 1V CHEST  3. Epigastric pain R10.13     ED Discharge Orders    None         Maudie Flakes, MD 05/28/18 2200

## 2018-05-28 NOTE — Discharge Instructions (Addendum)
You were evaluated in the Emergency Department and after careful evaluation, we did not find any emergent condition requiring admission or further testing in the hospital.  Please return to the Emergency Department if you experience any worsening of your condition.  We encourage you to follow up with a primary care provider as well as your GI specialist.  Thank you for allowing [place] to be a part of your care.

## 2018-05-28 NOTE — ED Triage Notes (Signed)
Pt arrives POV for eval of LUQ abd pain and back pain onset this AM after waking. Pt endorses >10 episodes of vomiting w/ diarrhea. Tearful and anxious during triage

## 2018-05-28 NOTE — ED Notes (Signed)
Pt restful at this time and appears sleeping.

## 2018-05-29 ENCOUNTER — Other Ambulatory Visit: Payer: Self-pay

## 2018-05-29 ENCOUNTER — Emergency Department (HOSPITAL_COMMUNITY)
Admission: EM | Admit: 2018-05-29 | Discharge: 2018-05-29 | Disposition: A | Discharge status: Home / Self Care | Payer: Medicare HMO | Source: Home / Self Care | Attending: Emergency Medicine | Admitting: Emergency Medicine

## 2018-05-29 ENCOUNTER — Encounter (HOSPITAL_COMMUNITY): Payer: Self-pay | Admitting: Emergency Medicine

## 2018-05-29 ENCOUNTER — Emergency Department (HOSPITAL_COMMUNITY): Payer: Medicare HMO

## 2018-05-29 DIAGNOSIS — K313 Pylorospasm, not elsewhere classified: Secondary | ICD-10-CM | POA: Diagnosis not present

## 2018-05-29 DIAGNOSIS — J45909 Unspecified asthma, uncomplicated: Secondary | ICD-10-CM | POA: Insufficient documentation

## 2018-05-29 DIAGNOSIS — F329 Major depressive disorder, single episode, unspecified: Secondary | ICD-10-CM | POA: Diagnosis not present

## 2018-05-29 DIAGNOSIS — Z8711 Personal history of peptic ulcer disease: Secondary | ICD-10-CM | POA: Diagnosis not present

## 2018-05-29 DIAGNOSIS — Z833 Family history of diabetes mellitus: Secondary | ICD-10-CM | POA: Diagnosis not present

## 2018-05-29 DIAGNOSIS — F419 Anxiety disorder, unspecified: Secondary | ICD-10-CM | POA: Diagnosis present

## 2018-05-29 DIAGNOSIS — D509 Iron deficiency anemia, unspecified: Secondary | ICD-10-CM | POA: Diagnosis not present

## 2018-05-29 DIAGNOSIS — K3189 Other diseases of stomach and duodenum: Secondary | ICD-10-CM | POA: Diagnosis not present

## 2018-05-29 DIAGNOSIS — Z9049 Acquired absence of other specified parts of digestive tract: Secondary | ICD-10-CM | POA: Diagnosis not present

## 2018-05-29 DIAGNOSIS — Z811 Family history of alcohol abuse and dependence: Secondary | ICD-10-CM | POA: Diagnosis not present

## 2018-05-29 DIAGNOSIS — J449 Chronic obstructive pulmonary disease, unspecified: Secondary | ICD-10-CM | POA: Diagnosis not present

## 2018-05-29 DIAGNOSIS — R112 Nausea with vomiting, unspecified: Secondary | ICD-10-CM | POA: Diagnosis not present

## 2018-05-29 DIAGNOSIS — R1084 Generalized abdominal pain: Secondary | ICD-10-CM | POA: Diagnosis not present

## 2018-05-29 DIAGNOSIS — Z1159 Encounter for screening for other viral diseases: Secondary | ICD-10-CM | POA: Diagnosis not present

## 2018-05-29 DIAGNOSIS — Z79899 Other long term (current) drug therapy: Secondary | ICD-10-CM | POA: Insufficient documentation

## 2018-05-29 DIAGNOSIS — E876 Hypokalemia: Secondary | ICD-10-CM

## 2018-05-29 DIAGNOSIS — N179 Acute kidney failure, unspecified: Secondary | ICD-10-CM | POA: Diagnosis not present

## 2018-05-29 DIAGNOSIS — Z880 Allergy status to penicillin: Secondary | ICD-10-CM | POA: Diagnosis not present

## 2018-05-29 DIAGNOSIS — F17210 Nicotine dependence, cigarettes, uncomplicated: Secondary | ICD-10-CM | POA: Diagnosis present

## 2018-05-29 DIAGNOSIS — R1110 Vomiting, unspecified: Secondary | ICD-10-CM | POA: Diagnosis not present

## 2018-05-29 DIAGNOSIS — K3184 Gastroparesis: Secondary | ICD-10-CM | POA: Diagnosis not present

## 2018-05-29 DIAGNOSIS — Z03818 Encounter for observation for suspected exposure to other biological agents ruled out: Secondary | ICD-10-CM | POA: Diagnosis not present

## 2018-05-29 DIAGNOSIS — Z91040 Latex allergy status: Secondary | ICD-10-CM | POA: Diagnosis not present

## 2018-05-29 DIAGNOSIS — D72829 Elevated white blood cell count, unspecified: Secondary | ICD-10-CM | POA: Diagnosis not present

## 2018-05-29 DIAGNOSIS — F12188 Cannabis abuse with other cannabis-induced disorder: Secondary | ICD-10-CM | POA: Diagnosis not present

## 2018-05-29 DIAGNOSIS — K219 Gastro-esophageal reflux disease without esophagitis: Secondary | ICD-10-CM | POA: Diagnosis not present

## 2018-05-29 DIAGNOSIS — F12988 Cannabis use, unspecified with other cannabis-induced disorder: Secondary | ICD-10-CM | POA: Diagnosis not present

## 2018-05-29 DIAGNOSIS — Z888 Allergy status to other drugs, medicaments and biological substances status: Secondary | ICD-10-CM | POA: Diagnosis not present

## 2018-05-29 DIAGNOSIS — G43909 Migraine, unspecified, not intractable, without status migrainosus: Secondary | ICD-10-CM | POA: Diagnosis present

## 2018-05-29 DIAGNOSIS — F209 Schizophrenia, unspecified: Secondary | ICD-10-CM | POA: Diagnosis not present

## 2018-05-29 DIAGNOSIS — T407X1A Poisoning by cannabis (derivatives), accidental (unintentional), initial encounter: Secondary | ICD-10-CM | POA: Insufficient documentation

## 2018-05-29 DIAGNOSIS — Z818 Family history of other mental and behavioral disorders: Secondary | ICD-10-CM | POA: Diagnosis not present

## 2018-05-29 DIAGNOSIS — M62838 Other muscle spasm: Secondary | ICD-10-CM | POA: Diagnosis present

## 2018-05-29 DIAGNOSIS — K311 Adult hypertrophic pyloric stenosis: Secondary | ICD-10-CM | POA: Diagnosis not present

## 2018-05-29 DIAGNOSIS — F1290 Cannabis use, unspecified, uncomplicated: Secondary | ICD-10-CM

## 2018-05-29 DIAGNOSIS — K449 Diaphragmatic hernia without obstruction or gangrene: Secondary | ICD-10-CM | POA: Diagnosis present

## 2018-05-29 DIAGNOSIS — Z720 Tobacco use: Secondary | ICD-10-CM | POA: Diagnosis not present

## 2018-05-29 DIAGNOSIS — E785 Hyperlipidemia, unspecified: Secondary | ICD-10-CM | POA: Diagnosis present

## 2018-05-29 DIAGNOSIS — R109 Unspecified abdominal pain: Secondary | ICD-10-CM | POA: Diagnosis not present

## 2018-05-29 LAB — URINALYSIS, ROUTINE W REFLEX MICROSCOPIC
Bilirubin Urine: NEGATIVE
Glucose, UA: NEGATIVE mg/dL
Hgb urine dipstick: NEGATIVE
Ketones, ur: 15 mg/dL — AB
Leukocytes,Ua: NEGATIVE
Nitrite: NEGATIVE
Protein, ur: NEGATIVE mg/dL
Specific Gravity, Urine: <1.005 — ABNORMAL LOW (ref 1.005–1.030)
pH: 8 (ref 5.0–8.0)

## 2018-05-29 LAB — CBC
HCT: 35 % — ABNORMAL LOW (ref 36.0–46.0)
Hemoglobin: 11.5 g/dL — ABNORMAL LOW (ref 12.0–15.0)
MCH: 30.7 pg (ref 26.0–34.0)
MCHC: 32.9 g/dL (ref 30.0–36.0)
MCV: 93.3 fL (ref 80.0–100.0)
Platelets: 170 10*3/uL (ref 150–400)
RBC: 3.75 MIL/uL — ABNORMAL LOW (ref 3.87–5.11)
RDW: 13.6 % (ref 11.5–15.5)
WBC: 19.3 10*3/uL — ABNORMAL HIGH (ref 4.0–10.5)
nRBC: 0 % (ref 0.0–0.2)

## 2018-05-29 LAB — COMPREHENSIVE METABOLIC PANEL
ALT: 21 U/L (ref 0–44)
AST: 28 U/L (ref 15–41)
Albumin: 4.8 g/dL (ref 3.5–5.0)
Alkaline Phosphatase: 75 U/L (ref 38–126)
Anion gap: 15 (ref 5–15)
BUN: 11 mg/dL (ref 6–20)
CO2: 20 mmol/L — ABNORMAL LOW (ref 22–32)
Calcium: 9.8 mg/dL (ref 8.9–10.3)
Chloride: 102 mmol/L (ref 98–111)
Creatinine, Ser: 0.89 mg/dL (ref 0.44–1.00)
GFR calc Af Amer: >60 mL/min (ref >60)
GFR calc non Af Amer: >60 mL/min (ref >60)
Glucose, Bld: 138 mg/dL — ABNORMAL HIGH (ref 70–99)
Potassium: 3 mmol/L — ABNORMAL LOW (ref 3.5–5.1)
Sodium: 137 mmol/L (ref 135–145)
Total Bilirubin: 0.6 mg/dL (ref 0.3–1.2)
Total Protein: 7.8 g/dL (ref 6.5–8.1)

## 2018-05-29 LAB — RAPID URINE DRUG SCREEN, HOSP PERFORMED
Amphetamines: NOT DETECTED
Barbiturates: NOT DETECTED
Benzodiazepines: NOT DETECTED
Cocaine: NOT DETECTED
Opiates: NOT DETECTED
Tetrahydrocannabinol: POSITIVE — AB

## 2018-05-29 LAB — LIPASE, BLOOD: Lipase: 36 U/L (ref 11–51)

## 2018-05-29 MED ORDER — HALOPERIDOL LACTATE 5 MG/ML IJ SOLN
2.0000 mg | Freq: Once | INTRAMUSCULAR | Status: AC
  Administered 2018-05-29: 09:00:00 2 mg via INTRAVENOUS
  Filled 2018-05-29: qty 1

## 2018-05-29 MED ORDER — SODIUM CHLORIDE 0.9 % IV BOLUS
1000.0000 mL | Freq: Once | INTRAVENOUS | Status: AC
  Administered 2018-05-29: 1000 mL via INTRAVENOUS

## 2018-05-29 MED ORDER — POTASSIUM CHLORIDE CRYS ER 20 MEQ PO TBCR
40.0000 meq | EXTENDED_RELEASE_TABLET | Freq: Once | ORAL | Status: AC
  Administered 2018-05-29: 11:00:00 40 meq via ORAL
  Filled 2018-05-29: qty 2

## 2018-05-29 MED ORDER — LIDOCAINE VISCOUS HCL 2 % MT SOLN
15.0000 mL | Freq: Once | OROMUCOSAL | Status: AC
  Administered 2018-05-29: 15 mL via ORAL
  Filled 2018-05-29: qty 15

## 2018-05-29 MED ORDER — ONDANSETRON HCL 4 MG/2ML IJ SOLN
4.0000 mg | Freq: Once | INTRAMUSCULAR | Status: AC | PRN
  Administered 2018-05-29: 4 mg via INTRAVENOUS
  Filled 2018-05-29: qty 2

## 2018-05-29 MED ORDER — PROMETHAZINE HCL 25 MG RE SUPP: 25.0000 mg | Freq: Four times a day (QID) | RECTAL | 0 refills | Status: DC | PRN

## 2018-05-29 MED ORDER — IOHEXOL 300 MG/ML  SOLN
100.0000 mL | Freq: Once | INTRAMUSCULAR | Status: AC | PRN
  Administered 2018-05-29: 100 mL via INTRAVENOUS

## 2018-05-29 MED ORDER — ALUM & MAG HYDROXIDE-SIMETH 200-200-20 MG/5ML PO SUSP
30.0000 mL | Freq: Once | ORAL | Status: AC
  Administered 2018-05-29: 12:00:00 30 mL via ORAL
  Filled 2018-05-29: qty 30

## 2018-05-29 NOTE — Discharge Instructions (Addendum)
Phenergan as needed as prescribed for nausea and vomiting. Stop marijuana use, this is likely causing your vomiting.

## 2018-05-29 NOTE — ED Notes (Signed)
Pt ambulated to bathroom for urine sample. Steady gait, however wanted help walking, didn't really need assistance. Told pt to pull white string if she needs help.

## 2018-05-29 NOTE — ED Triage Notes (Signed)
Pt just here at this facility last night. Pt reports hx of gastroparesis and when she was discharged home last night she was fine. Pt reports laying down and vomiting started again.

## 2018-05-29 NOTE — ED Provider Notes (Signed)
[facility] Provider Note   CSN: [id number] Arrival date & time: 05/29/18  0806    History   Chief Complaint Chief Complaint  Patient presents with   Emesis    HPI Tracey Daniel is a 38 y.o. female.     38 year old female presents with complaint of ongoing nausea, vomiting, abdominal pain.  Patient was seen in the emergency room yesterday for same, vomiting controlled with Haldol and patient was discharged home.  Patient states that upon arrival home she continued to have vomiting and reports numerous episode of emesis throughout the night, nonbloody, no changes in bowel or bladder habits, denies fevers or chills. Patient has a history of gastroparesis, pyloric stenosis, cannabinoid hyperemesis (reports last marijuana use several months ago).      Past Medical History:  Diagnosis Date   Anemia of other chronic disease 11/29/2012   Asthma    Back pain, chronic    Chronic abdominal pain    Constipation    COPD (chronic obstructive pulmonary disease) (HCC)    Cyst of skin    mid spine area   Depression 2000   h/o suicidal ideation   Gastric outlet obstruction    Gastritis    Gastroparesis    Migraine    Migraines    Nausea and vomiting    recurrent   Nicotine addiction    Osteoporosis    Peptic ulcer disease    Pyloric spasm 03/30/2011   Seasonal allergies    Sinusitis    Substance abuse ([place]) 2008   marijuana   Vitamin D deficiency     Patient Active Problem List   Diagnosis Date Noted   Cannabinoid hyperemesis syndrome ([place]) 03/11/2018   Intractable abdominal pain 03/07/2018   Dermatitis 02/01/2018   Hemorrhoids 02/22/2017   Nicotine dependence, cigarettes, with other nicotine-induced disorders 12/12/2016   Thoracic spine pain 11/23/2016   Nasal obstruction 11/23/2016   Hyperlipemia 10/02/2015   Premature ovarian failure 02/04/2015   Pregnancy as incidental finding, low  positive preg test, considered pituitary production of HCG 02/04/2015   Abdominal pain, epigastric 08/27/2014   Vitamin D deficiency 05/16/2014   Alopecia 08/01/2013   Asthma, cough variant 12/01/2012   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/2012   Marijuana use 11/14/2012   Seasonal allergies 11/14/2012   Nausea without vomiting 11/09/2012   Constipation 12/02/2011   Gastroparesis 05/01/2011   Pyloric spasm/Stenosis 03/30/2011   Hip pain, right 03/02/2011   Anemia 01/30/2011   Gastric outlet obstruction 01/01/2011   GERD (gastroesophageal reflux disease) 08/21/2010   Headache 03/13/2008   AMENORRHEA, SECONDARY 09/27/2007   ECZEMA 09/27/2007   Depression 07/01/2007   Back pain with radiation 07/01/2007   CLOSED FRACTURE OF METATARSAL BONE 01/27/2007    Past Surgical History:  Procedure Laterality Date   BALLOON DILATION N/A 02/09/2013   Procedure: BALLOON DILATION;  Surgeon: Missy Sabins, Tracey Daniel;  Location: [facility];  Service: Endoscopy;  Laterality: N/A;   BALLOON DILATION N/A 03/10/2018   Procedure: BALLOON DILATION;  Surgeon: Daneil Dolin, Tracey Daniel;  Location: [facility];  Service: Endoscopy;  Laterality: N/A;   BOTOX INJECTION N/A 02/09/2013   Procedure: BOTOX INJECTION;  Surgeon: Missy Sabins, Tracey Daniel;  Location: [facility];  Service: Endoscopy;  Laterality: N/A;  possible balloon   BOTOX INJECTION N/A 10/24/2015   Procedure: BOTOX INJECTION;  Surgeon: Daneil Dolin, Tracey Daniel;  Location: [facility];  Service: Endoscopy;  Laterality: N/A;   BOTOX INJECTION N/A 03/10/2018  Procedure: BOTOX INJECTION;  Surgeon: Daneil Dolin, Tracey Daniel;  Location: [facility];  Service: Endoscopy;  Laterality: N/A;  Melanie notified   CARPAL TUNNEL RELEASE     left hand   CHOLECYSTECTOMY     ?2002   COLONOSCOPY WITH PROPOFOL N/A 09/20/2014   RMR: Normal ileo-colonoscopy   ESOPHAGEAL DILATION  12/25/2010   Procedure: ESOPHAGEAL DILATION;  Surgeon: Dorothyann Peng, Tracey Daniel;  Location: [facility];  Service: Endoscopy;;   ESOPHAGOGASTRODUODENOSCOPY  12/25/2010   Dorothyann Peng, Tracey Daniel;  moderate gastritis, ?goo secondary to pylorspasm. BX showed reactive gstropathy no h.pyori, SB mucosa with intramucosal lymphocytosis and partial villous blunting (TTG 4.0 normal)   ESOPHAGOGASTRODUODENOSCOPY N/A 11/14/2012   JWJ:[id number] hiatal hernia. Abnormal gastric mucosa of  uncertain significance-status post biopsy. Subjectively, patient may have recurrent symptomatic, pylorospasm.   ESOPHAGOGASTRODUODENOSCOPY (EGD) WITH PROPOFOL N/A 02/09/2013   elongated stomach, partial lower esophageal ring widely patent. No obvious pyloric stenosis s/p Botox   ESOPHAGOGASTRODUODENOSCOPY (EGD) WITH PROPOFOL N/A 10/24/2015   Procedure: ESOPHAGOGASTRODUODENOSCOPY (EGD) WITH PROPOFOL;  Surgeon: Daneil Dolin, Tracey Daniel;  Location: [facility];  Service: Endoscopy;  Laterality: N/A;  830    ESOPHAGOGASTRODUODENOSCOPY (EGD) WITH PROPOFOL N/A 03/10/2018   Dr. Gala Romney: normal esophagus, elongated, dilated stomach likely stigmata of slow gastric emptying due to narrowing of pyloric channel, s/p balloon dilatation of pyloric channel. Small hiatal hernia, normal duodenal bulb and second portion of duodenum   laser surgery on cervix     NASAL SEPTUM SURGERY  01/29/2017   TOE SURGERY       OB History    Gravida  0   Para      Term      Preterm      AB      Living        SAB      TAB      Ectopic      Multiple      Live Births               Home Medications    Prior to Admission medications   Medication Sig Start Date End Date Taking? Authorizing Provider  acetaminophen (TYLENOL) 325 MG tablet Take 2 tablets (650 mg total) by mouth every 6 (six) hours as needed for mild pain (or Fever >/= 101). 03/11/18   Emokpae, Courage, Tracey Daniel  azelastine (ASTELIN) 0.1 % nasal spray USE 2 SPRAYS IN EACH NOSTRIL TWICE DAILY AS DIRECTED Patient taking differently: Place 2 sprays into  both nostrils 2 (two) times daily.  09/13/17   [name], Tracey Daniel  BIOTIN PO Take 1 tablet by mouth daily.    Provider, Historical, Tracey Daniel  calcium-vitamin D (OSCAL WITH D) 500-200 MG-UNIT per tablet Take 1 tablet by mouth 2 (two) times daily.    Provider, Historical, Tracey Daniel  chlorhexidine (PERIDEX) 0.12 % solution 5 mLs by Mouth Rinse route every morning.  05/22/16   Provider, Historical, Tracey Daniel  clobetasol cream (TEMOVATE) 8.29 % Apply 1 application topically 2 (two) times daily as needed (rash). Apply to rash    Provider, Historical, Tracey Daniel  dicyclomine (BENTYL) 20 MG tablet Take 1 tablet (20 mg total) by mouth 2 (two) times daily. 02/16/18   Henderly, Britni A, Tracey  estradiol (ESTRACE) 0.5 MG tablet Take 1 tablet (0.5 mg total) by mouth daily. 03/11/18   Roxan Hockey, Tracey Daniel  EVENING PRIMROSE OIL PO Take 1 tablet by mouth 2 (two) times daily.     Provider, Historical, Tracey Daniel  feeding supplement (ENSURE CLINICAL STRENGTH) LIQD Take 237 mLs by mouth 3 (three) times daily with meals. 01/01/11   Dhungel, Nishant, Tracey Daniel  fluticasone (FLONASE) 50 MCG/ACT nasal spray SHAKE LIQUID AND USE 2 SPRAYS IN EACH NOSTRIL DAILY Patient taking differently: Place 2 sprays into both nostrils daily.  11/18/17   [name], Tracey Daniel  gabapentin (NEURONTIN) 100 MG capsule TAKE ONE CAPSULE BY MOUTH THREE TIMES DAILY 05/25/18   [name], Tracey Daniel  LINZESS 290 MCG CAPS capsule TAKE 1 CAPSULE(290 MCG) BY MOUTH DAILY Patient taking differently: Take 290 mcg by mouth daily.  08/17/17   Annitta Needs, NP  loratadine (CLARITIN) 10 MG tablet Take 1 tablet (10 mg total) by mouth daily. 10/09/11   [name], Tracey Daniel  metoCLOPramide (REGLAN) 10 MG tablet Take 1 tablet (10 mg total) by mouth every 6 (six) hours. 02/22/18   Recardo Evangelist, Tracey  ondansetron (ZOFRAN ODT) 4 MG disintegrating tablet Take 1 tablet (4 mg total) by mouth every 8 (eight) hours as needed for nausea. 02/16/18   Henderly, Britni A, Tracey  ondansetron (ZOFRAN) 4 MG tablet  Take 1 tablet (4 mg total) by mouth every 6 (six) hours as needed for nausea. 03/11/18   Roxan Hockey, Tracey Daniel  pantoprazole (PROTONIX) 40 MG tablet Take 1 tablet (40 mg total) by mouth daily. 03/11/18   Roxan Hockey, Tracey Daniel  Polyethyl Glycol-Propyl Glycol (SYSTANE OP) Place 2 drops into the left eye 3 (three) times daily.    Provider, Historical, Tracey Daniel  polyethylene glycol (MIRALAX / GLYCOLAX) packet Take 17 g by mouth 2 (two) times daily. 03/11/18   Roxan Hockey, Tracey Daniel  potassium chloride SA (K-DUR,KLOR-CON) 20 MEQ tablet One tablet once daily by mouth Patient taking differently: Take 20 mEq by mouth daily. One tablet once daily by mouth 08/02/17   [name], Tracey Daniel  pravastatin (PRAVACHOL) 20 MG tablet Take 1 tablet (20 mg total) by mouth daily. 03/11/18   Roxan Hockey, Tracey Daniel  PREMARIN vaginal cream INSERT 6.21 APPLICATORFUL VAGINALLY THREE TIMES WEEKLY AS DIRECTED Patient taking differently: Place 3.08 Applicatorfuls vaginally 3 (three) times a week.  12/15/16   Jonnie Kind, Tracey Daniel  progesterone (PROMETRIUM) 200 MG capsule TAKE 1 CAPSULE BY MOUTH EVERY DAY FOR 2 WEEKS EVERY THIRD MONTH: JANUARY, APRIL, JULY AND OCTOBER Patient taking differently: Take 200 mg by mouth See admin instructions. TAKE 1 CAPSULE BY MOUTH EVERY DAY FOR 2 WEEKS EVERY THIRD MONTH: Quentin Mulling AND OCTOBER 10/03/17   Jonnie Kind, Tracey Daniel  promethazine (PHENERGAN) 25 MG suppository Place 1 suppository (25 mg total) rectally every 6 (six) hours as needed for nausea or vomiting. 05/29/18   Tacy Learn, Tracey  propranolol (INDERAL) 10 MG tablet Take 1 tablet (10 mg total) by mouth 3 (three) times daily. 03/11/18   Roxan Hockey, Tracey Daniel  risperiDONE (RISPERDAL) 0.5 MG tablet Take 0.5 mg at bedtime by mouth.    Provider, Historical, Tracey Daniel  senna-docusate (SENOKOT-S) 8.6-50 MG tablet Take 2 tablets by mouth at bedtime. 03/11/18 03/11/19  Roxan Hockey, Tracey Daniel  sertraline (ZOLOFT) 50 MG tablet Take 50 mg by mouth at bedtime.      Provider, Historical, Tracey Daniel  sucralfate (CARAFATE) 1 g tablet TAKE 1 TABLET BY MOUTH EVERY MORNING AND AT BEDTIME AS NEEDED FOR STOMACH BURNING( MAY CRUSH 1 TABLET AND MIX IN APPLESAUCE) Patient taking differently: Take 1 g by mouth 2 (two) times daily as needed (Stomach burning (may crush one tablet and mix in applesauce.)).  01/31/18  Annitta Needs, NP  SYMBICORT 80-4.5 MCG/ACT inhaler INHALE 2 PUFFS INTO THE LUNGS TWICE DAILY Patient taking differently: Inhale 2 puffs into the lungs 2 (two) times daily.  11/18/17   [name], Tracey Daniel  tiZANidine (ZANAFLEX) 4 MG tablet Take 1 tablet (4 mg total) by mouth 2 (two) times daily as needed (back or spasms). 03/24/18   [name], Tracey Daniel  VENTOLIN HFA 108 (90 Base) MCG/ACT inhaler INHALE 2 PUFFS INTO THE LUNGS EVERY 6 HOURS AS NEEDED FOR SHORTNESS OF BREATH Patient taking differently: Inhale 2 puffs into the lungs every 6 (six) hours as needed (shortness of breath.).  09/13/17   [name], Tracey Daniel  zonisamide (ZONEGRAN) 50 MG capsule Take 150 mg by mouth at bedtime.  02/21/17   Provider, Historical, Tracey Daniel    Family History Family History  Problem Relation Age of Onset   Diabetes Father    Liver disease Father        liver transplant at [facility], age 73   Lung cancer Mother 13   Heart disease Maternal Grandmother    Parkinson's disease Maternal Grandfather    Multiple sclerosis Sister 65   Depression Sister 36   Alcohol abuse Sister    Bipolar disorder Sister    Schizophrenia Sister    Colon cancer Neg Hx     Social History Social History   Tobacco Use   Smoking status: Current Every Day Smoker    Packs/day: 0.10    Years: 15.00    Pack years: 1.50    Types: Cigarettes   Smokeless tobacco: Never Used   Tobacco comment: 2 per day  Substance Use Topics   Alcohol use: No    Alcohol/week: 0.0 standard drinks   Drug use: Yes    Types: Marijuana     Allergies   Benadryl [diphenhydramine]; Penicillins; and  Latex   Review of Systems Review of Systems  Constitutional: Negative for chills, diaphoresis and fever.  Respiratory: Negative for shortness of breath.   Cardiovascular: Negative for chest pain.  Gastrointestinal: Positive for abdominal pain, nausea and vomiting. Negative for constipation and diarrhea.  Genitourinary: Positive for decreased urine volume. Negative for dysuria and frequency.  Musculoskeletal: Positive for arthralgias and myalgias.  Skin: Negative for rash and wound.  Neurological: Negative for dizziness and weakness.  Psychiatric/Behavioral: Negative for confusion.  All other systems reviewed and are negative.    Physical Exam Updated Vital Signs BP 125/71 (BP Location: Right Arm)    Pulse (!) 53    Temp 98 F (36.7 C) (Oral)    Resp 20    Ht 5\' 6"  (1.676 m)    Wt 66.2 kg    SpO2 100%    BMI 23.56 kg/m   Physical Exam Vitals signs and nursing note reviewed.  Constitutional:      General: She is not in acute distress.    Appearance: She is well-developed. She is not diaphoretic.  HENT:     Head: Normocephalic and atraumatic.  Cardiovascular:     Rate and Rhythm: Normal rate and regular rhythm.     Pulses: Normal pulses.     Heart sounds: Normal heart sounds.  Pulmonary:     Effort: Pulmonary effort is normal.     Breath sounds: Normal breath sounds.  Abdominal:     General: There is no distension.     Tenderness: There is abdominal tenderness in the left upper quadrant.  Skin:    General: Skin is warm and dry.  Findings: No erythema or rash.  Neurological:     Mental Status: She is alert and oriented to person, place, and time.  Psychiatric:        Behavior: Behavior normal.      ED Treatments / Results  Labs (all labs ordered are listed, but only abnormal results are displayed) Labs Reviewed  COMPREHENSIVE METABOLIC PANEL - Abnormal; Notable for the following components:      Result Value   Potassium 3.0 (*)    CO2 20 (*)    Glucose, Bld  138 (*)    All other components within normal limits  CBC - Abnormal; Notable for the following components:   WBC 19.3 (*)    RBC 3.75 (*)    Hemoglobin 11.5 (*)    HCT 35.0 (*)    All other components within normal limits  URINALYSIS, ROUTINE W REFLEX MICROSCOPIC - Abnormal; Notable for the following components:   Specific Gravity, Urine <1.005 (*)    Ketones, ur 15 (*)    All other components within normal limits  RAPID URINE DRUG SCREEN, HOSP PERFORMED - Abnormal; Notable for the following components:   Tetrahydrocannabinol POSITIVE (*)    All other components within normal limits  LIPASE, BLOOD    EKG None  Radiology Ct Abdomen Pelvis W Contrast  Result Date: 05/29/2018 CLINICAL DATA:  Abdominal pain. Generalized abdominal pain. Upper LEFT side abdominal pain. Vomiting. EXAM: CT ABDOMEN AND PELVIS WITH CONTRAST TECHNIQUE: Multidetector CT imaging of the abdomen and pelvis was performed using the standard protocol following bolus administration of intravenous contrast. CONTRAST:  148mL OMNIPAQUE IOHEXOL 300 MG/ML  SOLN COMPARISON:  CT 03/07/2018 FINDINGS: Lower chest: Lung bases are clear. Hepatobiliary: No focal hepatic lesion. Postcholecystectomy. No biliary dilatation. Pancreas: Pancreas is normal. No ductal dilatation. No pancreatic inflammation. Spleen: Normal spleen Adrenals/urinary tract: Adrenal glands and kidneys are normal. The ureters and bladder normal. Stomach/Bowel: Stomach, small bowel, appendix, and cecum are normal. The colon and rectosigmoid colon are normal. Vascular/Lymphatic: Abdominal aorta is normal caliber. No periportal or retroperitoneal adenopathy. No pelvic adenopathy. Reproductive: Uterus and necks are normal Other: No free fluid. Musculoskeletal: No aggressive osseous lesion. IMPRESSION: 1. No acute abdominopelvic findings. 2. Postcholecystectomy. Electronically Signed   By: Suzy Bouchard M.D.   On: 05/29/2018 09:38   Dg Abd Acute 2+v W 1v Chest  Result  Date: 05/28/2018 CLINICAL DATA:  Abdominal pain EXAM: DG ABDOMEN ACUTE W/ 1V CHEST COMPARISON:  02/22/2018 FINDINGS: Cardiac shadows within normal limits. The lungs are clear bilaterally. No acute bony abnormality chest is seen. Scattered large and small bowel gas is noted. Changes of prior cholecystectomy are seen. No bony abnormality is noted. No free air is seen. IMPRESSION: No acute abnormality in the chest and abdomen. Electronically Signed   By: Inez Catalina M.D.   On: 05/28/2018 21:51    Procedures Procedures (including critical care time)  Medications Ordered in ED Medications  ondansetron (ZOFRAN) injection 4 mg (4 mg Intravenous Given 05/29/18 0834)  haloperidol lactate (HALDOL) injection 2 mg (2 mg Intravenous Given 05/29/18 0927)  sodium chloride 0.9 % bolus 1,000 mL (0 mLs Intravenous Stopped 05/29/18 0928)  iohexol (OMNIPAQUE) 300 MG/ML solution 100 mL (100 mLs Intravenous Contrast Given 05/29/18 0912)  potassium chloride SA (K-DUR) CR tablet 40 mEq (40 mEq Oral Given 05/29/18 1108)  sodium chloride 0.9 % bolus 1,000 mL (0 mLs Intravenous Stopped 05/29/18 1230)  alum & mag hydroxide-simeth (MAALOX/MYLANTA) 200-200-20 MG/5ML suspension 30 mL (30 mLs Oral Given  05/29/18 1224)    And  lidocaine (XYLOCAINE) 2 % viscous mouth solution 15 mL (15 mLs Oral Given 05/29/18 1224)     Initial Impression / Assessment and Plan / ED Course  I have reviewed the triage vital signs and the nursing notes.  Pertinent labs & imaging results that were available during my care of the patient were reviewed by me and considered in my medical decision making (see chart for details).  Clinical Course as of May 28 1252  Sun May 29, 2147  511 38 year old female presents with nausea and vomiting with abdominal pain.  Patient was seen in the ER with same yesterday, vomiting controlled with Zofran and Haldol and discharged home with Zofran.  Patient reports continuing to vomit at home despite taking her Zofran.  On  exam patient with nonbloody emesis, mild tenderness in the left upper quadrant of the abdomen.  Patient was given Zofran and Haldol, sleeping on recheck.  Review of lab work shows increase in her white count to 19,000, CMP with mild hypokalemia with potassium of 3.0 which was orally repleted.  Urinalysis with ketones, urine drug screen positive for marijuana.  CT completed due to report of abdominal pain with increase in white count compared to labs performed yesterday in this emergency room and is negative for any acute findings.  Patient is tolerating p.o. fluids, plan is to discharge home with prescription for Phenergan PR, recheck with PCP.  Patient reported heartburn and was given GI cocktail prior to discharge.   [LM]    Clinical Course User Index [LM] Tacy Learn, Tracey      Final Clinical Impressions(s) / ED Diagnoses   Final diagnoses:  Cannabinoid hyperemesis syndrome ([place])  Hypokalemia    ED Discharge Orders         Ordered    promethazine (PHENERGAN) 25 MG suppository  Every 6 hours PRN     05/29/18 1221           Tacy Learn, Tracey 05/29/18 1254    Noemi Chapel, Tracey Daniel 05/30/18 [phone]

## 2018-05-31 ENCOUNTER — Encounter: Payer: Self-pay | Admitting: Nurse Practitioner

## 2018-05-31 ENCOUNTER — Ambulatory Visit (INDEPENDENT_AMBULATORY_CARE_PROVIDER_SITE_OTHER): Payer: Medicare HMO | Admitting: Nurse Practitioner

## 2018-05-31 ENCOUNTER — Other Ambulatory Visit: Payer: Self-pay

## 2018-05-31 ENCOUNTER — Encounter: Payer: Self-pay | Admitting: Internal Medicine

## 2018-05-31 ENCOUNTER — Inpatient Hospital Stay (HOSPITAL_COMMUNITY)
Admission: EM | Admit: 2018-05-31 | Discharge: 2018-06-03 | DRG: 392 | Disposition: A | Discharge status: Home / Self Care | Payer: Medicare HMO | Attending: Family Medicine | Admitting: Family Medicine

## 2018-05-31 ENCOUNTER — Encounter (HOSPITAL_COMMUNITY): Payer: Self-pay | Admitting: *Deleted

## 2018-05-31 DIAGNOSIS — Z801 Family history of malignant neoplasm of trachea, bronchus and lung: Secondary | ICD-10-CM

## 2018-05-31 DIAGNOSIS — Z880 Allergy status to penicillin: Secondary | ICD-10-CM

## 2018-05-31 DIAGNOSIS — R109 Unspecified abdominal pain: Secondary | ICD-10-CM | POA: Diagnosis present

## 2018-05-31 DIAGNOSIS — R1084 Generalized abdominal pain: Secondary | ICD-10-CM

## 2018-05-31 DIAGNOSIS — R112 Nausea with vomiting, unspecified: Secondary | ICD-10-CM

## 2018-05-31 DIAGNOSIS — Z888 Allergy status to other drugs, medicaments and biological substances status: Secondary | ICD-10-CM

## 2018-05-31 DIAGNOSIS — Z7951 Long term (current) use of inhaled steroids: Secondary | ICD-10-CM

## 2018-05-31 DIAGNOSIS — Z560 Unemployment, unspecified: Secondary | ICD-10-CM

## 2018-05-31 DIAGNOSIS — Z79899 Other long term (current) drug therapy: Secondary | ICD-10-CM

## 2018-05-31 DIAGNOSIS — Z8711 Personal history of peptic ulcer disease: Secondary | ICD-10-CM

## 2018-05-31 DIAGNOSIS — F12988 Cannabis use, unspecified with other cannabis-induced disorder: Secondary | ICD-10-CM | POA: Diagnosis not present

## 2018-05-31 DIAGNOSIS — F329 Major depressive disorder, single episode, unspecified: Secondary | ICD-10-CM | POA: Diagnosis present

## 2018-05-31 DIAGNOSIS — Z820 Family history of epilepsy and other diseases of the nervous system: Secondary | ICD-10-CM

## 2018-05-31 DIAGNOSIS — K311 Adult hypertrophic pyloric stenosis: Secondary | ICD-10-CM | POA: Diagnosis present

## 2018-05-31 DIAGNOSIS — N179 Acute kidney failure, unspecified: Secondary | ICD-10-CM | POA: Diagnosis present

## 2018-05-31 DIAGNOSIS — D638 Anemia in other chronic diseases classified elsewhere: Secondary | ICD-10-CM | POA: Diagnosis present

## 2018-05-31 DIAGNOSIS — M549 Dorsalgia, unspecified: Secondary | ICD-10-CM | POA: Diagnosis not present

## 2018-05-31 DIAGNOSIS — Z91040 Latex allergy status: Secondary | ICD-10-CM

## 2018-05-31 DIAGNOSIS — E785 Hyperlipidemia, unspecified: Secondary | ICD-10-CM | POA: Diagnosis present

## 2018-05-31 DIAGNOSIS — D649 Anemia, unspecified: Secondary | ICD-10-CM | POA: Diagnosis present

## 2018-05-31 DIAGNOSIS — K313 Pylorospasm, not elsewhere classified: Secondary | ICD-10-CM | POA: Diagnosis not present

## 2018-05-31 DIAGNOSIS — Z720 Tobacco use: Secondary | ICD-10-CM | POA: Diagnosis present

## 2018-05-31 DIAGNOSIS — F209 Schizophrenia, unspecified: Secondary | ICD-10-CM | POA: Diagnosis present

## 2018-05-31 DIAGNOSIS — K3184 Gastroparesis: Principal | ICD-10-CM | POA: Diagnosis present

## 2018-05-31 DIAGNOSIS — Z818 Family history of other mental and behavioral disorders: Secondary | ICD-10-CM

## 2018-05-31 DIAGNOSIS — Z833 Family history of diabetes mellitus: Secondary | ICD-10-CM

## 2018-05-31 DIAGNOSIS — Z1159 Encounter for screening for other viral diseases: Secondary | ICD-10-CM

## 2018-05-31 DIAGNOSIS — M25519 Pain in unspecified shoulder: Secondary | ICD-10-CM | POA: Diagnosis present

## 2018-05-31 DIAGNOSIS — K219 Gastro-esophageal reflux disease without esophagitis: Secondary | ICD-10-CM | POA: Diagnosis present

## 2018-05-31 DIAGNOSIS — K5909 Other constipation: Secondary | ICD-10-CM | POA: Diagnosis present

## 2018-05-31 DIAGNOSIS — E876 Hypokalemia: Secondary | ICD-10-CM

## 2018-05-31 DIAGNOSIS — Z8249 Family history of ischemic heart disease and other diseases of the circulatory system: Secondary | ICD-10-CM

## 2018-05-31 DIAGNOSIS — F419 Anxiety disorder, unspecified: Secondary | ICD-10-CM | POA: Diagnosis present

## 2018-05-31 DIAGNOSIS — F17210 Nicotine dependence, cigarettes, uncomplicated: Secondary | ICD-10-CM | POA: Diagnosis present

## 2018-05-31 DIAGNOSIS — R1110 Vomiting, unspecified: Secondary | ICD-10-CM

## 2018-05-31 DIAGNOSIS — D72829 Elevated white blood cell count, unspecified: Secondary | ICD-10-CM | POA: Diagnosis present

## 2018-05-31 DIAGNOSIS — F32A Depression, unspecified: Secondary | ICD-10-CM | POA: Diagnosis present

## 2018-05-31 DIAGNOSIS — J449 Chronic obstructive pulmonary disease, unspecified: Secondary | ICD-10-CM | POA: Diagnosis present

## 2018-05-31 DIAGNOSIS — R197 Diarrhea, unspecified: Secondary | ICD-10-CM | POA: Diagnosis present

## 2018-05-31 DIAGNOSIS — G43909 Migraine, unspecified, not intractable, without status migrainosus: Secondary | ICD-10-CM | POA: Diagnosis present

## 2018-05-31 DIAGNOSIS — Z811 Family history of alcohol abuse and dependence: Secondary | ICD-10-CM

## 2018-05-31 DIAGNOSIS — K449 Diaphragmatic hernia without obstruction or gangrene: Secondary | ICD-10-CM | POA: Diagnosis present

## 2018-05-31 DIAGNOSIS — M62838 Other muscle spasm: Secondary | ICD-10-CM | POA: Diagnosis present

## 2018-05-31 DIAGNOSIS — Z9049 Acquired absence of other specified parts of digestive tract: Secondary | ICD-10-CM

## 2018-05-31 DIAGNOSIS — F12188 Cannabis abuse with other cannabis-induced disorder: Secondary | ICD-10-CM | POA: Diagnosis present

## 2018-05-31 LAB — CBC
HCT: 34.3 % — ABNORMAL LOW (ref 36.0–46.0)
Hemoglobin: 11.1 g/dL — ABNORMAL LOW (ref 12.0–15.0)
MCH: 30.7 pg (ref 26.0–34.0)
MCHC: 32.4 g/dL (ref 30.0–36.0)
MCV: 94.8 fL (ref 80.0–100.0)
Platelets: 150 10*3/uL (ref 150–400)
RBC: 3.62 MIL/uL — ABNORMAL LOW (ref 3.87–5.11)
RDW: 13.2 % (ref 11.5–15.5)
WBC: 12.2 10*3/uL — ABNORMAL HIGH (ref 4.0–10.5)
nRBC: 0 % (ref 0.0–0.2)

## 2018-05-31 LAB — URINALYSIS, ROUTINE W REFLEX MICROSCOPIC
Bilirubin Urine: NEGATIVE
Glucose, UA: NEGATIVE mg/dL
Hgb urine dipstick: NEGATIVE
Ketones, ur: 80 mg/dL — AB
Leukocytes,Ua: NEGATIVE
Nitrite: NEGATIVE
Protein, ur: NEGATIVE mg/dL
Specific Gravity, Urine: 1.015 (ref 1.005–1.030)
pH: 7 (ref 5.0–8.0)

## 2018-05-31 LAB — COMPREHENSIVE METABOLIC PANEL
ALT: 45 U/L — ABNORMAL HIGH (ref 0–44)
AST: 43 U/L — ABNORMAL HIGH (ref 15–41)
Albumin: 4.5 g/dL (ref 3.5–5.0)
Alkaline Phosphatase: 72 U/L (ref 38–126)
Anion gap: 11 (ref 5–15)
BUN: 12 mg/dL (ref 6–20)
CO2: 24 mmol/L (ref 22–32)
Calcium: 9.4 mg/dL (ref 8.9–10.3)
Chloride: 103 mmol/L (ref 98–111)
Creatinine, Ser: 1.02 mg/dL — ABNORMAL HIGH (ref 0.44–1.00)
GFR calc Af Amer: >60 mL/min (ref >60)
GFR calc non Af Amer: >60 mL/min (ref >60)
Glucose, Bld: 98 mg/dL (ref 70–99)
Potassium: 3.1 mmol/L — ABNORMAL LOW (ref 3.5–5.1)
Sodium: 138 mmol/L (ref 135–145)
Total Bilirubin: 1 mg/dL (ref 0.3–1.2)
Total Protein: 7.2 g/dL (ref 6.5–8.1)

## 2018-05-31 LAB — I-STAT BETA HCG BLOOD, ED (MC, WL, AP ONLY): I-stat hCG, quantitative: 5.1 m[IU]/mL — ABNORMAL HIGH (ref <5)

## 2018-05-31 LAB — LIPASE, BLOOD: Lipase: 24 U/L (ref 11–51)

## 2018-05-31 LAB — SARS CORONAVIRUS 2 BY RT PCR (HOSPITAL ORDER, PERFORMED IN ~~LOC~~ HOSPITAL LAB): SARS Coronavirus 2: NEGATIVE

## 2018-05-31 LAB — MAGNESIUM: Magnesium: 2.2 mg/dL (ref 1.7–2.4)

## 2018-05-31 MED ORDER — NICOTINE 21 MG/24HR TD PT24
21.0000 mg | MEDICATED_PATCH | Freq: Every day | TRANSDERMAL | Status: DC
  Administered 2018-06-01 – 2018-06-03 (×3): 21 mg via TRANSDERMAL
  Filled 2018-05-31 (×3): qty 1

## 2018-05-31 MED ORDER — SODIUM CHLORIDE 0.9 % IV SOLN
INTRAVENOUS | Status: DC
  Administered 2018-05-31 – 2018-06-03 (×7): via INTRAVENOUS

## 2018-05-31 MED ORDER — DICYCLOMINE HCL 20 MG PO TABS
20.0000 mg | ORAL_TABLET | Freq: Two times a day (BID) | ORAL | Status: DC
  Administered 2018-05-31 – 2018-06-01 (×2): 20 mg via ORAL
  Filled 2018-05-31 (×3): qty 1

## 2018-05-31 MED ORDER — PRAVASTATIN SODIUM 10 MG PO TABS
20.0000 mg | ORAL_TABLET | Freq: Every day | ORAL | Status: DC
  Administered 2018-06-01 – 2018-06-03 (×3): 20 mg via ORAL
  Filled 2018-05-31 (×3): qty 2

## 2018-05-31 MED ORDER — METOCLOPRAMIDE HCL 5 MG/ML IJ SOLN
10.0000 mg | Freq: Once | INTRAMUSCULAR | Status: AC
  Administered 2018-05-31: 17:00:00 10 mg via INTRAVENOUS
  Filled 2018-05-31: qty 2

## 2018-05-31 MED ORDER — LINACLOTIDE 145 MCG PO CAPS
290.0000 ug | ORAL_CAPSULE | Freq: Every day | ORAL | Status: DC
  Administered 2018-06-01 – 2018-06-02 (×2): 290 ug via ORAL
  Filled 2018-05-31 (×2): qty 2

## 2018-05-31 MED ORDER — MORPHINE SULFATE (PF) 4 MG/ML IV SOLN
INTRAVENOUS | Status: AC
  Administered 2018-05-31: 4 mg via INTRAVENOUS
  Filled 2018-05-31: qty 1

## 2018-05-31 MED ORDER — LINACLOTIDE 145 MCG PO CAPS
290.0000 ug | ORAL_CAPSULE | Freq: Every day | ORAL | Status: DC
  Filled 2018-05-31: qty 2

## 2018-05-31 MED ORDER — TIZANIDINE HCL 2 MG PO TABS
4.0000 mg | ORAL_TABLET | Freq: Two times a day (BID) | ORAL | Status: DC | PRN
  Administered 2018-06-01 – 2018-06-02 (×3): 4 mg via ORAL
  Filled 2018-05-31: qty 2
  Filled 2018-05-31 (×2): qty 1
  Filled 2018-05-31: qty 2

## 2018-05-31 MED ORDER — AZELASTINE HCL 0.1 % NA SOLN
2.0000 | Freq: Two times a day (BID) | NASAL | Status: DC
  Administered 2018-05-31 – 2018-06-03 (×5): 2 via NASAL
  Filled 2018-05-31: qty 30

## 2018-05-31 MED ORDER — CALCIUM CARBONATE-VITAMIN D 500-200 MG-UNIT PO TABS
1.0000 | ORAL_TABLET | Freq: Two times a day (BID) | ORAL | Status: DC
  Administered 2018-05-31 – 2018-06-03 (×6): 1 via ORAL
  Filled 2018-05-31 (×6): qty 1

## 2018-05-31 MED ORDER — ALBUTEROL SULFATE (2.5 MG/3ML) 0.083% IN NEBU: 3.0000 mL | INHALATION_SOLUTION | Freq: Four times a day (QID) | RESPIRATORY_TRACT | Status: DC | PRN

## 2018-05-31 MED ORDER — ZONISAMIDE 25 MG PO CAPS
150.0000 mg | ORAL_CAPSULE | Freq: Every day | ORAL | Status: DC
  Administered 2018-05-31 – 2018-06-02 (×3): 150 mg via ORAL
  Filled 2018-05-31 (×4): qty 6

## 2018-05-31 MED ORDER — ACETAMINOPHEN 325 MG PO TABS
650.0000 mg | ORAL_TABLET | Freq: Four times a day (QID) | ORAL | Status: DC | PRN
  Administered 2018-06-01: 22:00:00 650 mg via ORAL
  Filled 2018-05-31: qty 2

## 2018-05-31 MED ORDER — MORPHINE SULFATE (PF) 2 MG/ML IV SOLN
2.0000 mg | INTRAVENOUS | Status: DC | PRN
  Administered 2018-06-01 – 2018-06-03 (×10): 2 mg via INTRAVENOUS
  Filled 2018-05-31 (×10): qty 1

## 2018-05-31 MED ORDER — MORPHINE SULFATE (PF) 4 MG/ML IV SOLN
4.0000 mg | Freq: Once | INTRAVENOUS | Status: AC
  Administered 2018-05-31: 4 mg via INTRAVENOUS
  Filled 2018-05-31: qty 1

## 2018-05-31 MED ORDER — GABAPENTIN 100 MG PO CAPS
100.0000 mg | ORAL_CAPSULE | Freq: Three times a day (TID) | ORAL | Status: DC
  Administered 2018-05-31 – 2018-06-02 (×4): 100 mg via ORAL
  Filled 2018-05-31 (×5): qty 1

## 2018-05-31 MED ORDER — CHLORHEXIDINE GLUCONATE 0.12 % MT SOLN
5.0000 mL | Freq: Every morning | OROMUCOSAL | Status: DC
  Administered 2018-06-01 – 2018-06-03 (×2): 5 mL via OROMUCOSAL
  Filled 2018-05-31 (×2): qty 15

## 2018-05-31 MED ORDER — ONDANSETRON HCL 4 MG/2ML IJ SOLN
4.0000 mg | Freq: Three times a day (TID) | INTRAMUSCULAR | Status: DC | PRN
  Administered 2018-06-01 – 2018-06-02 (×2): 4 mg via INTRAVENOUS
  Filled 2018-05-31 (×2): qty 2

## 2018-05-31 MED ORDER — SENNOSIDES-DOCUSATE SODIUM 8.6-50 MG PO TABS
2.0000 | ORAL_TABLET | Freq: Every day | ORAL | Status: DC
  Administered 2018-05-31 – 2018-06-01 (×2): 2 via ORAL
  Filled 2018-05-31 (×3): qty 2

## 2018-05-31 MED ORDER — ACETAMINOPHEN 650 MG RE SUPP: 650.0000 mg | Freq: Four times a day (QID) | RECTAL | Status: DC | PRN

## 2018-05-31 MED ORDER — PROPRANOLOL HCL 20 MG PO TABS
10.0000 mg | ORAL_TABLET | Freq: Three times a day (TID) | ORAL | Status: DC
  Administered 2018-06-01 – 2018-06-02 (×4): 10 mg via ORAL
  Filled 2018-05-31 (×9): qty 1

## 2018-05-31 MED ORDER — FLUTICASONE PROPIONATE 50 MCG/ACT NA SUSP
2.0000 | Freq: Every day | NASAL | Status: DC
  Administered 2018-06-01 – 2018-06-03 (×2): 2 via NASAL
  Filled 2018-05-31 (×2): qty 16

## 2018-05-31 MED ORDER — POTASSIUM CHLORIDE 10 MEQ/100ML IV SOLN
10.0000 meq | INTRAVENOUS | Status: AC
  Administered 2018-05-31 (×2): 10 meq via INTRAVENOUS
  Filled 2018-05-31 (×2): qty 100

## 2018-05-31 MED ORDER — SERTRALINE HCL 50 MG PO TABS
50.0000 mg | ORAL_TABLET | Freq: Every day | ORAL | Status: DC
  Administered 2018-05-31 – 2018-06-02 (×3): 50 mg via ORAL
  Filled 2018-05-31 (×3): qty 1

## 2018-05-31 MED ORDER — FAMOTIDINE IN NACL 20-0.9 MG/50ML-% IV SOLN
20.0000 mg | Freq: Two times a day (BID) | INTRAVENOUS | Status: DC
  Administered 2018-05-31 – 2018-06-01 (×2): 20 mg via INTRAVENOUS
  Filled 2018-05-31 (×2): qty 50

## 2018-05-31 MED ORDER — ESTRADIOL 0.5 MG PO TABS: 0.2500 mg | ORAL_TABLET | Freq: Every day | ORAL | Status: DC

## 2018-05-31 MED ORDER — SODIUM CHLORIDE 0.9 % IV SOLN
INTRAVENOUS | Status: DC
  Administered 2018-05-31: 18:00:00 via INTRAVENOUS

## 2018-05-31 MED ORDER — MOMETASONE FURO-FORMOTEROL FUM 100-5 MCG/ACT IN AERO
2.0000 | INHALATION_SPRAY | Freq: Two times a day (BID) | RESPIRATORY_TRACT | Status: DC
  Administered 2018-05-31 – 2018-06-03 (×6): 2 via RESPIRATORY_TRACT
  Filled 2018-05-31: qty 8.8

## 2018-05-31 MED ORDER — POLYETHYL GLYCOL-PROPYL GLYCOL 0.4-0.3 % OP GEL
Freq: Three times a day (TID) | OPHTHALMIC | Status: DC
  Filled 2018-05-31: qty 10

## 2018-05-31 MED ORDER — POLYETHYLENE GLYCOL 3350 17 G PO PACK
17.0000 g | PACK | Freq: Two times a day (BID) | ORAL | Status: DC
  Filled 2018-05-31 (×5): qty 1

## 2018-05-31 MED ORDER — RISPERIDONE 0.5 MG PO TABS
0.5000 mg | ORAL_TABLET | Freq: Every day | ORAL | Status: DC
  Administered 2018-05-31 – 2018-06-02 (×3): 0.5 mg via ORAL
  Filled 2018-05-31 (×4): qty 1

## 2018-05-31 MED ORDER — METOCLOPRAMIDE HCL 5 MG/ML IJ SOLN
5.0000 mg | Freq: Three times a day (TID) | INTRAMUSCULAR | Status: DC
  Administered 2018-05-31 – 2018-06-03 (×9): 5 mg via INTRAVENOUS
  Filled 2018-05-31 (×9): qty 2

## 2018-05-31 MED ORDER — LORATADINE 10 MG PO TABS
10.0000 mg | ORAL_TABLET | Freq: Every day | ORAL | Status: DC
  Administered 2018-06-01 – 2018-06-03 (×3): 10 mg via ORAL
  Filled 2018-05-31 (×3): qty 1

## 2018-05-31 MED ORDER — SUCRALFATE 1 G PO TABS: 1.0000 g | ORAL_TABLET | Freq: Two times a day (BID) | ORAL | Status: DC | PRN

## 2018-05-31 MED ORDER — SODIUM CHLORIDE 0.9% FLUSH
3.0000 mL | Freq: Once | INTRAVENOUS | Status: AC
  Administered 2018-05-31: 18:00:00 3 mL via INTRAVENOUS

## 2018-05-31 NOTE — Assessment & Plan Note (Signed)
History of cannabinoid hyperemesis syndrome.  This is possibly contributing to her symptoms now, although less likely because she states she has not smoked marijuana in 2 to 3 weeks.  However, her symptoms did exacerbate 2 weeks ago so there is a possible relation to this.  Recommend that she completely abstain from marijuana given her history.  Follow-up in 6 weeks.

## 2018-05-31 NOTE — Progress Notes (Unsigned)
D udg

## 2018-05-31 NOTE — Assessment & Plan Note (Signed)
History of pyloric stenosis and pyloric spasm with GOO status post Botox injection.  Recently admitted to the hospital and February of this year with an EGD showing again pyloric stenosis status post [facility] dilation.  At that time recommended surgical referral for pyloroplasty if any persistent symptomatic pyloric stenosis.  The patient in the past 1 to 2 weeks has had worsening and significant nausea and vomiting.  I typically found food, fluids, medications.  Given her significant emesis I have referred her to the emergency department for stabilization.  Can consider upper GI series at that time.  Follow-up in our office in 6 weeks.

## 2018-05-31 NOTE — ED Notes (Signed)
Morphine vial blew the bottom piece out & splashed onto floor, Beverlee Nims, RN witnessed this occurrence.

## 2018-05-31 NOTE — ED Provider Notes (Signed)
[facility] EMERGENCY DEPARTMENT Provider Note   CSN: [id number] Arrival date & time: 05/31/18  1425    History   Chief Complaint Chief Complaint  Patient presents with  . Abdominal Pain    HPI Tracey Daniel is a 38 y.o. female.     HPI   38 year old female with nausea and vomiting.  She has a past history of pyloric stenosis and pyloric spasm with gastric outlet obstruction status post previous Botox injection.  She is also recently admitted to the hospital this past February and she had an EGD showing pyloric stenosis and she had Maloney dilation.  She is followed by [facility].  More recently, she began having epigastric pain and nausea/vomiting.  This is her third ER visit in the past 3 days she also had an ED visit with her gastroenterologist.  She has been taking Reglan, Phenergan p.o. and suppositories without much improvement of her symptoms.     Past Medical History:  Diagnosis Date  . Anemia of other chronic disease 11/29/2012  . Asthma   . Back pain, chronic   . Chronic abdominal pain   . Constipation   . COPD (chronic obstructive pulmonary disease) ([place])   . Cyst of skin    mid spine area  . Depression 2000   h/o suicidal ideation  . Gastric outlet obstruction   . Gastritis   . Gastroparesis   . Migraine   . Migraines   . Nausea and vomiting    recurrent  . Nicotine addiction   . Osteoporosis   . Peptic ulcer disease   . Pyloric spasm 03/30/2011  . Seasonal allergies   . Sinusitis   . Substance abuse ([place]) 2008   marijuana  . Vitamin D deficiency     Patient Active Problem List   Diagnosis Date Noted  . Abdominal pain 05/31/2018  . Cannabinoid hyperemesis syndrome ([place]) 03/11/2018  . Intractable abdominal pain 03/07/2018  . Dermatitis 02/01/2018  . Hemorrhoids 02/22/2017  . Nicotine dependence, cigarettes, with other nicotine-induced disorders 12/12/2016  . Thoracic spine pain 11/23/2016  .  Nasal obstruction 11/23/2016  . Hyperlipemia 10/02/2015  . Premature ovarian failure 02/04/2015  . Pregnancy as incidental finding, low positive preg test, considered pituitary production of HCG 02/04/2015  . Abdominal pain, epigastric 08/27/2014  . Vitamin D deficiency 05/16/2014  . Alopecia 08/01/2013  . Asthma, cough variant 12/01/2012  . 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 Marijuana use 11/14/2012  . Seasonal allergies 11/14/2012  . Nausea without vomiting 11/09/2012  . Constipation 12/02/2011  . Gastroparesis 05/01/2011  . Pyloric spasm/Stenosis 03/30/2011  . Hip pain, right 03/02/2011  . Anemia 01/30/2011  . Gastric outlet obstruction 01/01/2011  . GERD (gastroesophageal reflux disease) 08/21/2010  . Headache 03/13/2008  . AMENORRHEA, SECONDARY 09/27/2007  . ECZEMA 09/27/2007  . Depression 07/01/2007  . Back pain with radiation 07/01/2007  . CLOSED FRACTURE OF METATARSAL BONE 01/27/2007    Past Surgical History:  Procedure Laterality Date  . BALLOON DILATION N/A 02/09/2013   Procedure: BALLOON DILATION;  Surgeon: Missy Sabins, MD;  Location: [facility];  Service: Endoscopy;  Laterality: N/A;  . BALLOON DILATION N/A 03/10/2018   Procedure: BALLOON DILATION;  Surgeon: Daneil Dolin, MD;  Location: [facility];  Service: Endoscopy;  Laterality: N/A;  . BOTOX INJECTION N/A 02/09/2013   Procedure: BOTOX INJECTION;  Surgeon: Missy Sabins, MD;  Location: [facility];  Service: Endoscopy;  Laterality: N/A;  possible  balloon  . BOTOX INJECTION N/A 10/24/2015   Procedure: BOTOX INJECTION;  Surgeon: Daneil Dolin, MD;  Location: [facility];  Service: Endoscopy;  Laterality: N/A;  . BOTOX INJECTION N/A 03/10/2018   Procedure: BOTOX INJECTION;  Surgeon: Daneil Dolin, MD;  Location[facility];  Service: Endoscopy;  Laterality: N/A;  Melanie notified  . CARPAL TUNNEL RELEASE     left hand  . CHOLECYSTECTOMY     ?2002  . COLONOSCOPY WITH  PROPOFOL N/A 09/20/2014   RMR: Normal ileo-colonoscopy  . ESOPHAGEAL DILATION  12/25/2010   Procedure: ESOPHAGEAL DILATION;  Surgeon: Dorothyann Peng, MD;  Location: [facility];  Service: Endoscopy;;  . ESOPHAGOGASTRODUODENOSCOPY  12/25/2010   Dorothyann Peng, MD;  moderate gastritis, ?goo secondary to pylorspasm. BX showed reactive gstropathy no h.pyori, SB mucosa with intramucosal lymphocytosis and partial villous blunting (TTG 4.0 normal)  . ESOPHAGOGASTRODUODENOSCOPY N/A 11/14/2012   GUR:KYHCW hiatal hernia. Abnormal gastric mucosa of  uncertain significance-status post biopsy. Subjectively, patient may have recurrent symptomatic, pylorospasm.  . ESOPHAGOGASTRODUODENOSCOPY (EGD) WITH PROPOFOL N/A 02/09/2013   elongated stomach, partial lower esophageal ring widely patent. No obvious pyloric stenosis s/p Botox  . ESOPHAGOGASTRODUODENOSCOPY (EGD) WITH PROPOFOL N/A 10/24/2015   Procedure: ESOPHAGOGASTRODUODENOSCOPY (EGD) WITH PROPOFOL;  Surgeon: Daneil Dolin, MD;  [facility];  Service: Endoscopy;  Laterality: N/A;  830   . ESOPHAGOGASTRODUODENOSCOPY (EGD) WITH PROPOFOL N/A 03/10/2018   Dr. Gala Romney: normal esophagus, elongated, dilated stomach likely stigmata of slow gastric emptying due to narrowing of pyloric channel, s/p balloon dilatation of pyloric channel. Small hiatal hernia, normal duodenal bulb and second portion of duodenum  . laser surgery on cervix    . NASAL SEPTUM SURGERY  01/29/2017  . TOE SURGERY       OB History    Gravida  0   Para      Term      Preterm      AB      Living        SAB      TAB      Ectopic      Multiple      Live Births               Home Medications    Prior to Admission medications   Medication Sig Start Date End Date Taking? Authorizing Provider  acetaminophen (TYLENOL) 325 MG tablet Take 2 tablets (650 mg total) by mouth every 6 (six) hours as needed for mild pain (or Fever >/= 101). 03/11/18   Emokpae, Courage, MD   azelastine (ASTELIN) 0.1 % nasal spray USE 2 SPRAYS IN EACH NOSTRIL TWICE DAILY AS DIRECTED Patient taking differently: Place 2 sprays into both nostrils 2 (two) times daily.  09/13/17   [name], MD  BIOTIN PO Take 1 tablet by mouth daily.    [provider]  calcium-vitamin D (OSCAL WITH D) 500-200 MG-UNIT per tablet Take 1 tablet by mouth 2 (two) times daily.    [provider]  chlorhexidine (PERIDEX) 0.12 % solution 5 mLs by Mouth Rinse route every morning.  05/22/16   [provider]  clobetasol cream (TEMOVATE) 2.37 % Apply 1 application topically 2 (two) times daily as needed (rash). Apply to rash    [provider]  dicyclomine (BENTYL) 20 MG tablet Take 1 tablet (20 mg total) by mouth 2 (two) times daily. 02/16/18   Henderly, Britni A, PA-C  estradiol (ESTRACE) 0.5 MG  tablet Take 1 tablet (0.5 mg total) by mouth daily. Patient taking differently: Take 0.25 mg by mouth daily.  03/11/18   Roxan Hockey, MD  EVENING PRIMROSE OIL PO Take 1 tablet by mouth 2 (two) times daily.     [provider]  feeding supplement (ENSURE CLINICAL STRENGTH) LIQD Take 237 mLs by mouth 3 (three) times daily with meals. 01/01/11   Dhungel, Nishant, MD  fluticasone (FLONASE) 50 MCG/ACT nasal spray SHAKE LIQUID AND USE 2 SPRAYS IN EACH NOSTRIL DAILY Patient taking differently: Place 2 sprays into both nostrils daily.  11/18/17   [name], MD  gabapentin (NEURONTIN) 100 MG capsule TAKE ONE CAPSULE BY MOUTH THREE TIMES DAILY 05/25/18   [name], MD  LINZESS 290 MCG CAPS capsule TAKE 1 CAPSULE(290 MCG) BY MOUTH DAILY Patient taking differently: Take 290 mcg by mouth daily.  08/17/17   Annitta Needs, NP  loratadine (CLARITIN) 10 MG tablet Take 1 tablet (10 mg total) by mouth daily. 10/09/11   [name], MD  metoCLOPramide (REGLAN) 10 MG tablet Take 1 tablet (10 mg total) by mouth every 6 (six) hours. 02/22/18   Recardo Evangelist, PA-C   ondansetron (ZOFRAN ODT) 4 MG disintegrating tablet Take 1 tablet (4 mg total) by mouth every 8 (eight) hours as needed for nausea. 02/16/18   Henderly, Britni A, PA-C  ondansetron (ZOFRAN) 4 MG tablet Take 1 tablet (4 mg total) by mouth every 6 (six) hours as needed for nausea. 03/11/18   Roxan Hockey, MD  pantoprazole (PROTONIX) 40 MG tablet Take 1 tablet (40 mg total) by mouth daily. 03/11/18   Roxan Hockey, MD  Polyethyl Glycol-Propyl Glycol (SYSTANE OP) Place 2 drops into the left eye 3 (three) times daily.    [provider]  polyethylene glycol (MIRALAX / GLYCOLAX) packet Take 17 g by mouth 2 (two) times daily. 03/11/18   Roxan Hockey, MD  potassium chloride SA (K-DUR,KLOR-CON) 20 MEQ tablet One tablet once daily by mouth Patient taking differently: Take 20 mEq by mouth daily. One tablet once daily by mouth 08/02/17   [name], MD  pravastatin (PRAVACHOL) 20 MG tablet Take 1 tablet (20 mg total) by mouth daily. 03/11/18   Roxan Hockey, MD  PREMARIN vaginal cream INSERT 0.25 APPLICATORFUL VAGINALLY THREE TIMES WEEKLY AS DIRECTED Patient taking differently: Place 4.27 Applicatorfuls vaginally once. Takes every 3rd week of each month 12/15/16   Jonnie Kind, MD  progesterone (PROMETRIUM) 200 MG capsule TAKE 1 CAPSULE BY MOUTH EVERY DAY FOR 2 WEEKS EVERY THIRD MONTH: JANUARY, [place], JULY AND OCTOBER Patient taking differently: Take 200 mg by mouth See admin instructions. TAKE 1 CAPSULE BY MOUTH EVERY DAY FOR 2 WEEKS EVERY THIRD MONTH: Quentin Mulling AND OCTOBER 10/03/17   Jonnie Kind, MD  promethazine (PHENERGAN) 25 MG suppository Place 1 suppository (25 mg total) rectally every 6 (six) hours as needed for nausea or vomiting. 05/29/18   Tacy Learn, PA-C  propranolol (INDERAL) 10 MG tablet Take 1 tablet (10 mg total) by mouth 3 (three) times daily. 03/11/18   Roxan Hockey, MD  risperiDONE (RISPERDAL) 0.5 MG tablet Take 0.5 mg at bedtime by mouth.     [provider]  senna-docusate (SENOKOT-S) 8.6-50 MG tablet Take 2 tablets by mouth at bedtime. 03/11/18 03/11/19  Roxan Hockey, MD  sertraline (ZOLOFT) 50 MG tablet Take 50 mg by mouth at bedtime.     [provider]  sucralfate (CARAFATE) 1 g  tablet TAKE 1 TABLET BY MOUTH EVERY MORNING AND AT BEDTIME AS NEEDED FOR STOMACH BURNING( MAY CRUSH 1 TABLET AND MIX IN APPLESAUCE) Patient taking differently: Take 1 g by mouth 2 (two) times daily as needed (Stomach burning (may crush one tablet and mix in applesauce.)).  01/31/18   Annitta Needs, NP  SYMBICORT 80-4.5 MCG/ACT inhaler INHALE 2 PUFFS INTO THE LUNGS TWICE DAILY Patient taking differently: Inhale 2 puffs into the lungs 2 (two) times daily.  11/18/17   [name], MD  tiZANidine (ZANAFLEX) 4 MG tablet Take 1 tablet (4 mg total) by mouth 2 (two) times daily as needed (back or spasms). 03/24/18   [name], MD  VENTOLIN HFA 108 (90 Base) MCG/ACT inhaler INHALE 2 PUFFS INTO THE LUNGS EVERY 6 HOURS AS NEEDED FOR SHORTNESS OF BREATH Patient taking differently: Inhale 2 puffs into the lungs every 6 (six) hours as needed (shortness of breath.).  09/13/17   [name], MD  zonisamide (ZONEGRAN) 50 MG capsule Take 150 mg by mouth at bedtime.  02/21/17   [provider]    Family History Family History  Problem Relation Age of Onset  . Diabetes Father   . Liver disease Father        liver transplant at [facility], age 7  . Lung cancer Mother 15  . Heart disease Maternal Grandmother   . Parkinson's disease Maternal Grandfather   . Multiple sclerosis Sister 20  . Depression Sister 81  . Alcohol abuse Sister   . Bipolar disorder Sister   . Schizophrenia Sister   . Colon cancer Neg Hx     Social History Social History   Tobacco Use  . Smoking status: Current Every Day Smoker    Packs/day: 0.10    Years: 15.00    Pack years: 1.50    Types: Cigarettes  . Smokeless tobacco: Never Used  . Tobacco  comment: 2 per day  Substance Use Topics  . Alcohol use: No    Alcohol/week: 0.0 standard drinks  . Drug use: Yes    Types: Marijuana    Comment: As of 05/31/2018: last use 3 weeks ago     Allergies   Benadryl [diphenhydramine]; Penicillins; and Latex   Review of Systems Review of Systems  All systems reviewed and negative, other than as noted in HPI.  Physical Exam Updated Vital Signs BP (!) 159/101   Pulse (!) 50   Temp 98.4 F (36.9 C) (Oral)   Resp 17   Ht 5\' 6"  (1.676 m)   Wt 66.2 kg   SpO2 100%   BMI 23.56 kg/m   Physical Exam Vitals signs and nursing note reviewed.  Constitutional:      Appearance: She is well-developed. She is ill-appearing.     Comments: Actively retching  HENT:     Head: Normocephalic and atraumatic.  Eyes:     General:        Right eye: No discharge.        Left eye: No discharge.     Conjunctiva/sclera: Conjunctivae normal.  Neck:     Musculoskeletal: Neck supple.  Cardiovascular:     Rate and Rhythm: Normal rate and regular rhythm.     Heart sounds: Normal heart sounds. No murmur. No friction rub. No gallop.   Pulmonary:     Effort: Pulmonary effort is normal. No respiratory distress.     Breath sounds: Normal breath sounds.  Abdominal:     General: There is  no distension.     Palpations: Abdomen is soft.     Tenderness: There is abdominal tenderness.     Comments: Epigastric tenderness w/o rebound or guarding  Musculoskeletal:        General: No tenderness.  Skin:    General: Skin is warm and dry.  Neurological:     Mental Status: She is alert.  Psychiatric:        Behavior: Behavior normal.        Thought Content: Thought content normal.      ED Treatments / Results  Labs (all labs ordered are listed, but only abnormal results are displayed) Labs Reviewed  COMPREHENSIVE METABOLIC PANEL - Abnormal; Notable for the following components:      Result Value   Potassium 3.1 (*)    Creatinine, Ser 1.02 (*)    AST 43  (*)    ALT 45 (*)    All other components within normal limits  CBC - Abnormal; Notable for the following components:   WBC 12.2 (*)    RBC 3.62 (*)    Hemoglobin 11.1 (*)    HCT 34.3 (*)    All other components within normal limits  URINALYSIS, ROUTINE W REFLEX MICROSCOPIC - Abnormal; Notable for the following components:   APPearance HAZY (*)    Ketones, ur 80 (*)    All other components within normal limits  BASIC METABOLIC PANEL - Abnormal; Notable for the following components:   Potassium 3.4 (*)    Calcium 8.6 (*)    All other components within normal limits  CBC - Abnormal; Notable for the following components:   RBC 3.35 (*)    Hemoglobin 10.2 (*)    HCT 31.4 (*)    Platelets 134 (*)    All other components within normal limits  PROTIME-INR - Abnormal; Notable for the following components:   Prothrombin Time 15.3 (*)    All other components within normal limits  I-STAT BETA HCG BLOOD, ED ([facility], [facility], [facility] ONLY) - Abnormal; Notable for the following components:   I-stat hCG, quantitative 5.1 (*)    All other components within normal limits  SARS CORONAVIRUS 2 (HOSPITAL ORDER, [facility])  LIPASE, BLOOD  MAGNESIUM  BASIC METABOLIC PANEL  HEMOGLOBIN A1C  SURGICAL PATHOLOGY    EKG None  Radiology Dg Abd 1 View - Kub  Result Date: 06/01/2018 CLINICAL DATA:  Post EGD with pyloric dilatation. Abdominal pain. Rule out perforation. EXAM: ABDOMEN - 1 VIEW COMPARISON:  None. FINDINGS: The bowel gas pattern is normal. No radio-opaque calculi or other significant radiographic abnormality are seen. No free air. IMPRESSION: Negative. Electronically Signed   By: Franchot Gallo M.D.   On: 06/01/2018 16:55   Dg Chest Port 1 View  Result Date: 06/01/2018 CLINICAL DATA:  Post EGD with pyloric dilatation. Abdominal pain. Rule out free air. EXAM: PORTABLE CHEST 1 VIEW COMPARISON:  05/28/2018 FINDINGS: The heart size and mediastinal contours are within normal  limits. Both lungs are clear. The visualized skeletal structures are unremarkable. No free air. IMPRESSION: No active disease. Electronically Signed   By: Franchot Gallo M.D.   On: 06/01/2018 16:56    Procedures Procedures (including critical care time)  Medications Ordered in ED Medications  potassium chloride 10 mEq in 100 mL IVPB (10 mEq Intravenous New Bag/Given 05/31/18 1928)  metoCLOPramide (REGLAN) injection 5 mg (has no administration in time range)  morphine 2 MG/ML injection 2 mg (has no administration in time  range)  pravastatin (PRAVACHOL) tablet 20 mg (has no administration in time range)  propranolol (INDERAL) tablet 10 mg (has no administration in time range)  risperiDONE (RISPERDAL) tablet 0.5 mg (has no administration in time range)  sertraline (ZOLOFT) tablet 50 mg (has no administration in time range)  estradiol (ESTRACE) tablet 0.25 mg (has no administration in time range)  dicyclomine (BENTYL) tablet 20 mg (has no administration in time range)  linaclotide (LINZESS) capsule 290 mcg (has no administration in time range)  polyethylene glycol (MIRALAX / GLYCOLAX) packet 17 g (has no administration in time range)  senna-docusate (Senokot-S) tablet 2 tablet (has no administration in time range)  sucralfate (CARAFATE) tablet 1 g (has no administration in time range)  gabapentin (NEURONTIN) capsule 100 mg (has no administration in time range)  tiZANidine (ZANAFLEX) tablet 4 mg (has no administration in time range)  zonisamide (ZONEGRAN) capsule 150 mg (has no administration in time range)  calcium-vitamin D (OSCAL WITH D) 500-200 MG-UNIT per tablet 1 tablet (has no administration in time range)  azelastine (ASTELIN) 0.1 % nasal spray 2 spray (has no administration in time range)  fluticasone (FLONASE) 50 MCG/ACT nasal spray 2 spray (has no administration in time range)  loratadine (CLARITIN) tablet 10 mg (has no administration in time range)  mometasone-formoterol (DULERA)  100-5 MCG/ACT inhaler 2 puff (has no administration in time range)  albuterol (VENTOLIN HFA) 108 (90 Base) MCG/ACT inhaler 2 puff (has no administration in time range)  chlorhexidine (PERIDEX) 0.12 % solution 5 mL (has no administration in time range)  polyethylene glycol 0.4% and propylene glycol 0.3% (SYSTANE) ophthalmic gel (has no administration in time range)  0.9 %  sodium chloride infusion (has no administration in time range)  famotidine (PEPCID) IVPB 20 mg premix (has no administration in time range)  nicotine (NICODERM CQ - dosed in mg/24 hours) patch 21 mg (has no administration in time range)  ondansetron (ZOFRAN) injection 4 mg (has no administration in time range)  sodium chloride flush (NS) 0.9 % injection 3 mL (3 mLs Intravenous Given 05/31/18 1731)  metoCLOPramide (REGLAN) injection 10 mg (10 mg Intravenous Given 05/31/18 1717)  morphine 4 MG/ML injection 4 mg (4 mg Intravenous Given 05/31/18 1924)     Initial Impression / Assessment and Plan / ED Course  I have reviewed the triage vital signs and the nursing notes.  Pertinent labs & imaging results that were available during my care of the patient were reviewed by me and considered in my medical decision making (see chart for details).  38 year old female with persistent nausea/vomiting.  Multiple ED visits and and e-visit with gastroenterologist within the past several days.  She does have a past history of gastric outlet obstruction.  A lot of ketones on UA. Still retching in the ED. Recent imaging reviewed. I do no think shes needs additional at this time w/o significant change in symptoms. At this point, will admit for symptomatic treatment.  Will consult GI.  Discussed with Dr Henrene Pastor, GI.  GI will see in am. Discussed with Dr Blaine Hamper, hospitalist for admission.   Final Clinical Impressions(s) / ED Diagnoses   Final diagnoses:  Intractable vomiting with nausea, unspecified vomiting type  Hypokalemia    ED Discharge Orders     None       Virgel Manifold, MD 06/01/18 1728

## 2018-05-31 NOTE — Assessment & Plan Note (Signed)
Patient describes ongoing abdominal pain which started a couple weeks ago and is worsened as of her ER visits within the past couple weeks.  She was evaluated by the ER.  CT imaging without acute findings.  She does have a history of cannabinoid hyperemesis syndrome and pyloric stenosis/pyloric spasm.  She is having further pyloric issues this could be contributing to her abdominal pain and for sure her nausea and vomiting.  At this point given the fact that she is not able to tolerate food and fluids, I am unable to keep her medications down I have recommended she go to the emergency room for stabilization.  Query possible need for admission given persistent recent problems.  She would likely benefit from an upper GI series to evaluate for further pyloric stenosis and, based on recommendations after last EGD, would benefit from surgical consult for pyloroplasty.  Otherwise we will see her in follow-up in 6 weeks.  She is to call us with any worsening or severe symptoms.

## 2018-05-31 NOTE — Progress Notes (Signed)
Referring Provider: [name], MD Primary Care Physician:  [name], MD Primary GI:  Dr. Gala Romney  NOTE: Service was provided via telemedicine and was requested by the patient due to COVID-19 pandemic.  Method of visit: Doxy.Me  Patient Location: Home  Provider Location: Office  Reason for Phone Visit: Follow-up  The patient was consented to phone follow-up via telephone encounter including billing of the encounter (yes/no): Yes  Persons present on the phone encounter, with roles: None  Total time (minutes) spent on medical discussion: 21 minutes  Chief Complaint  Patient presents with  . Follow-up    F/U from [facility] ER visit,Abd pain,Left side pain and back pain,Vomiting    HPI:   Tracey Daniel is a 38 y.o. female who presents for virtual visit regarding: Follow-up on abdominal pain.  Patient was last seen in our office 04/20/2018 for pyloric spasm/stenosis and constipation.  Last office visit was a virtual visit via telephone due to COVID-19/coronavirus pandemic.  Noted history of abdominal pain, nausea vomiting GERD, constipation, pyloric spasms in the past requiring Botox injections in 2017.  Recently inpatient in February 2020 with acute on chronic abdominal pain and nausea with vomiting.  EGD with balloon dilation of pyloric channel was completed during her admission.  At her last visit she was eating smaller amounts, decreased appetite, generally stable weight.  Abdominal pain improved at that time with no associated nausea or vomiting.  Denies NSAIDs.  Taking Ensure 3 times daily and Linzess daily.  Also taking Protonix daily.  Recommended frequent meals throughout the day, continue PPI, call if weight loss, inability to tolerate diet, nausea/vomiting.  Follow-up in 4 months.  Call if any change in symptoms.  The patient was in the emergency department 05/28/2018 for abdominal pain and emesis.  She does have a history of cannabinoid hyperemesis syndrome.  At  that time she noted 2 to 3 weeks of intermittent mild epigastric and left upper quadrant discomfort accompanied by belching, nausea, intermittent vomiting.  Symptoms became severe the morning of her presentation, no lower abdominal pain.  Occasional diarrhea which is normal for her.  She was medicated and given fluids and improved after that.  X-ray with no concerns, tolerating oral intake and discharged with recommended follow-up with GI.  She presented back to the emergency department the next day stating that when she arrived home from a previous ER visit she began having vomiting through the night which was nonbloody.  Noted mild epigastric and left upper quadrant tenderness on exam, white count increased to 19,000, mild hypokalemia with a potassium of 3.0 which was orally repleted, urine drug screen positive for marijuana.  CT abdomen performed which was negative for any acute findings.  She began tolerating p.o. fluids and was discharged home with Phenergan.  Today she states she's doing ok overall. She's not doing well, still with N/V. Having vomiting every 10-20 minutes despite Phenergan. Tried Phenergan suppository which has helped. Can't keep anything down, difficulty keeping down fluids. Having significant abdominal pain lower abdomen. Hasn't had a bowel movement in 2 days. Last Marijuana 2-3 weeks ago. Felt some issues with stomach a couple weeks ago but didn't pay attention until her symptoms worsened on this past Saturday. Denies hematochezia, melena, hematemesis. Denies fever, chills. Has been trying to eat smaller, more frequent meals. Has been trying to drink Ensure which has been coming up. Denies URI and flu-like symptoms. Denies chest pain, dyspnea, dizziness, lightheadedness, syncope, near syncope. Denies any other  upper or lower GI symptoms.  Hasn't been able to keep down her medications. Has been taking Reglan and Protonix.   Past Medical History:  Diagnosis Date  . Anemia of other  chronic disease 11/29/2012  . Asthma   . Back pain, chronic   . Chronic abdominal pain   . Constipation   . COPD (chronic obstructive pulmonary disease) ([place])   . Cyst of skin    mid spine area  . Depression 2000   h/o suicidal ideation  . Gastric outlet obstruction   . Gastritis   . Gastroparesis   . Migraine   . Migraines   . Nausea and vomiting    recurrent  . Nicotine addiction   . Osteoporosis   . Peptic ulcer disease   . Pyloric spasm 03/30/2011  . Seasonal allergies   . Sinusitis   . Substance abuse ([place]) 2008   marijuana  . Vitamin D deficiency     Past Surgical History:  Procedure Laterality Date  . BALLOON DILATION N/A 02/09/2013   Procedure: BALLOON DILATION;  Surgeon: Missy Sabins, MD;  Location: [facility];  Service: Endoscopy;  Laterality: N/A;  . BALLOON DILATION N/A 03/10/2018   Procedure: BALLOON DILATION;  Surgeon: Daneil Dolin, MD;  Location: [facility];  Service: Endoscopy;  Laterality: N/A;  . BOTOX INJECTION N/A 02/09/2013   Procedure: BOTOX INJECTION;  Surgeon: Missy Sabins, MD;  Location: [facility];  Service: Endoscopy;  Laterality: N/A;  possible balloon  . BOTOX INJECTION N/A 10/24/2015   Procedure: BOTOX INJECTION;  Surgeon: Daneil Dolin, MD;  Location: [facility];  Service: Endoscopy;  Laterality: N/A;  . BOTOX INJECTION N/A 03/10/2018   Procedure: BOTOX INJECTION;  Surgeon: Daneil Dolin, MD;  Location: [facility];  Service: Endoscopy;  Laterality: N/A;  Melanie notified  . CARPAL TUNNEL RELEASE     left hand  . CHOLECYSTECTOMY     ?2002  . COLONOSCOPY WITH PROPOFOL N/A 09/20/2014   RMR: Normal ileo-colonoscopy  . ESOPHAGEAL DILATION  12/25/2010   Procedure: ESOPHAGEAL DILATION;  Surgeon: Dorothyann Peng, MD;  Location: [facility];  Service: Endoscopy;;  . ESOPHAGOGASTRODUODENOSCOPY  12/25/2010   Dorothyann Peng, MD;  moderate gastritis, ?goo secondary to pylorspasm. BX showed reactive gstropathy no h.pyori, SB mucosa with  intramucosal lymphocytosis and partial villous blunting (TTG 4.0 normal)  . ESOPHAGOGASTRODUODENOSCOPY N/A 11/14/2012   GXQ:JJHER hiatal hernia. Abnormal gastric mucosa of  uncertain significance-status post biopsy. Subjectively, patient may have recurrent symptomatic, pylorospasm.  . ESOPHAGOGASTRODUODENOSCOPY (EGD) WITH PROPOFOL N/A 02/09/2013   elongated stomach, partial lower esophageal ring widely patent. No obvious pyloric stenosis s/p Botox  . ESOPHAGOGASTRODUODENOSCOPY (EGD) WITH PROPOFOL N/A 10/24/2015   Procedure: ESOPHAGOGASTRODUODENOSCOPY (EGD) WITH PROPOFOL;  Surgeon: Daneil Dolin, MD;  Location: [facility];  Service: Endoscopy;  Laterality: N/A;  830   . ESOPHAGOGASTRODUODENOSCOPY (EGD) WITH PROPOFOL N/A 03/10/2018   Dr. Gala Romney: normal esophagus, elongated, dilated stomach likely stigmata of slow gastric emptying due to narrowing of pyloric channel, s/p balloon dilatation of pyloric channel. Small hiatal hernia, normal duodenal bulb and second portion of duodenum  . laser surgery on cervix    . NASAL SEPTUM SURGERY  01/29/2017  . TOE SURGERY      Current Outpatient Medications  Medication Sig Dispense Refill  . acetaminophen (TYLENOL) 325 MG tablet Take 2 tablets (650 mg total) by mouth every 6 (six) hours as needed for mild pain (or Fever >/= 101). [place]  tablet 0  . azelastine (ASTELIN) 0.1 % nasal spray USE 2 SPRAYS IN EACH NOSTRIL TWICE DAILY AS DIRECTED (Patient taking differently: Place 2 sprays into both nostrils 2 (two) times daily. ) 30 mL 5  . BIOTIN PO Take 1 tablet by mouth daily.    . calcium-vitamin D (OSCAL WITH D) 500-200 MG-UNIT per tablet Take 1 tablet by mouth 2 (two) times daily.    . chlorhexidine (PERIDEX) 0.12 % solution 5 mLs by Mouth Rinse route every morning.   2  . clobetasol cream (TEMOVATE) 9.44 % Apply 1 application topically 2 (two) times daily as needed (rash). Apply to rash    . dicyclomine (BENTYL) 20 MG tablet Take 1 tablet (20 mg total) by mouth 2  (two) times daily. 20 tablet 0  . estradiol (ESTRACE) 0.5 MG tablet Take 1 tablet (0.5 mg total) by mouth daily. (Patient taking differently: Take 0.25 mg by mouth daily. ) 30 tablet 3  . EVENING PRIMROSE OIL PO Take 1 tablet by mouth 2 (two) times daily.     . feeding supplement (ENSURE CLINICAL STRENGTH) LIQD Take 237 mLs by mouth 3 (three) times daily with meals. 10 Bottle 3  . fluticasone (FLONASE) 50 MCG/ACT nasal spray SHAKE LIQUID AND USE 2 SPRAYS IN EACH NOSTRIL DAILY (Patient taking differently: Place 2 sprays into both nostrils daily. ) 48 g 5  . gabapentin (NEURONTIN) 100 MG capsule TAKE ONE CAPSULE BY MOUTH THREE TIMES DAILY 90 capsule 0  . LINZESS 290 MCG CAPS capsule TAKE 1 CAPSULE(290 MCG) BY MOUTH DAILY (Patient taking differently: Take 290 mcg by mouth daily. ) 90 capsule 3  . loratadine (CLARITIN) 10 MG tablet Take 1 tablet (10 mg total) by mouth daily. 30 tablet 5  . metoCLOPramide (REGLAN) 10 MG tablet Take 1 tablet (10 mg total) by mouth every 6 (six) hours. 10 tablet 0  . ondansetron (ZOFRAN ODT) 4 MG disintegrating tablet Take 1 tablet (4 mg total) by mouth every 8 (eight) hours as needed for nausea. 10 tablet 0  . ondansetron (ZOFRAN) 4 MG tablet Take 1 tablet (4 mg total) by mouth every 6 (six) hours as needed for nausea. 20 tablet 0  . pantoprazole (PROTONIX) 40 MG tablet Take 1 tablet (40 mg total) by mouth daily. 30 tablet 4  . Polyethyl Glycol-Propyl Glycol (SYSTANE OP) Place 2 drops into the left eye 3 (three) times daily.    . polyethylene glycol (MIRALAX / GLYCOLAX) packet Take 17 g by mouth 2 (two) times daily. 14 each 0  . potassium chloride SA (K-DUR,KLOR-CON) 20 MEQ tablet One tablet once daily by mouth (Patient taking differently: Take 20 mEq by mouth daily. One tablet once daily by mouth) 90 tablet 3  . pravastatin (PRAVACHOL) 20 MG tablet Take 1 tablet (20 mg total) by mouth daily. 90 tablet 3  . PREMARIN vaginal cream INSERT 9.67 APPLICATORFUL VAGINALLY THREE  TIMES WEEKLY AS DIRECTED (Patient taking differently: Place 5.91 Applicatorfuls vaginally once. Takes every 3rd week of each month) 30 g 2  . progesterone (PROMETRIUM) 200 MG capsule TAKE 1 CAPSULE BY MOUTH EVERY DAY FOR 2 WEEKS EVERY THIRD MONTH: JANUARY, APRIL, [place] (Patient taking differently: Take 200 mg by mouth See admin instructions. TAKE 1 CAPSULE BY MOUTH EVERY DAY FOR 2 WEEKS EVERY THIRD MONTH: JANUARY, APRIL, JULY AND OCTOBER) 14 capsule 2  . promethazine (PHENERGAN) 25 MG suppository Place 1 suppository (25 mg total) rectally every 6 (six) hours as needed for nausea or  vomiting. 6 each 0  . propranolol (INDERAL) 10 MG tablet Take 1 tablet (10 mg total) by mouth 3 (three) times daily. 90 tablet 2  . risperiDONE (RISPERDAL) 0.5 MG tablet Take 0.5 mg at bedtime by mouth.    . senna-docusate (SENOKOT-S) 8.6-50 MG tablet Take 2 tablets by mouth at bedtime. 60 tablet 3  . sertraline (ZOLOFT) 50 MG tablet Take 50 mg by mouth at bedtime.     . sucralfate (CARAFATE) 1 g tablet TAKE 1 TABLET BY MOUTH EVERY MORNING AND AT BEDTIME AS NEEDED FOR STOMACH BURNING( MAY CRUSH 1 TABLET AND MIX IN APPLESAUCE) (Patient taking differently: Take 1 g by mouth 2 (two) times daily as needed (Stomach burning (may crush one tablet and mix in applesauce.)). ) 180 tablet 0  . SYMBICORT 80-4.5 MCG/ACT inhaler INHALE 2 PUFFS INTO THE LUNGS TWICE DAILY (Patient taking differently: Inhale 2 puffs into the lungs 2 (two) times daily. ) 10.2 g 5  . tiZANidine (ZANAFLEX) 4 MG tablet Take 1 tablet (4 mg total) by mouth 2 (two) times daily as needed (back or spasms). 180 tablet 0  . VENTOLIN HFA 108 (90 Base) MCG/ACT inhaler INHALE 2 PUFFS INTO THE LUNGS EVERY 6 HOURS AS NEEDED FOR SHORTNESS OF BREATH (Patient taking differently: Inhale 2 puffs into the lungs every 6 (six) hours as needed (shortness of breath.). ) 18 g 5  . zonisamide (ZONEGRAN) 50 MG capsule Take 150 mg by mouth at bedtime.   2   No current  facility-administered medications for this visit.     Allergies as of 05/31/2018 - Review Complete 05/31/2018  Allergen Reaction Noted  . Benadryl [diphenhydramine] Anaphylaxis 03/25/2014  . Penicillins Other (See Comments)   . Latex Itching and Other (See Comments) 11/30/2011    Family History  Problem Relation Age of Onset  . Diabetes Father   . Liver disease Father        liver transplant at [facility], age 65  . Lung cancer Mother 63  . Heart disease Maternal Grandmother   . Parkinson's disease Maternal Grandfather   . Multiple sclerosis Sister 28  . Depression Sister 78  . Alcohol abuse Sister   . Bipolar disorder Sister   . Schizophrenia Sister   . Colon cancer Neg Hx     Social History   Socioeconomic History  . Marital status: Significant Other    Spouse name: Not on file  . Number of children: 0  . Years of education: 9  . Highest education level: 9th grade  Occupational History  . Occupation: disability    Employer: UNEMPLOYED  Social Needs  . Financial resource strain: Not very hard  . Food insecurity:    Worry: Sometimes true    Inability: Sometimes true  . Transportation needs:    Medical: No    Non-medical: No  Tobacco Use  . Smoking status: Current Every Day Smoker    Packs/day: 0.10    Years: 15.00    Pack years: 1.50    Types: Cigarettes  . Smokeless tobacco: Never Used  . Tobacco comment: 2 per day  Substance and Sexual Activity  . Alcohol use: No    Alcohol/week: 0.0 standard drinks  . Drug use: Yes    Types: Marijuana    Comment: As of 05/31/2018: last use 3 weeks ago  . Sexual activity: Yes    Birth control/protection: None  Lifestyle  . Physical activity:    Days per week: 3 days    Minutes  per session: 30 min  . Stress: Not at all  Relationships  . Social connections:    Talks on phone: More than three times a week    Gets together: Twice a week    Attends religious service: Never    Active member of club or organization: No     Attends meetings of clubs or organizations: Never    Relationship status: Living with partner  Other Topics Concern  . Not on file  Social History Narrative  . Not on file    Review of Systems: General: Negative for anorexia, weight loss, fever, chills, fatigue, weakness. ENT: Negative for hoarseness, difficulty swallowing. CV: Negative for chest pain, angina, palpitations, peripheral edema.  Respiratory: Negative for dyspnea at rest, cough, sputum, wheezing.  GI: See history of present illness. hallucinations.  Endo: Negative for unusual weight change.  Heme: Negative for bruising or bleeding. Allergy: Negative for rash or hives.  Physical Exam: Note: limited exam due to virtual visit General:   Alert and oriented. Pleasant and cooperative. Well-nourished and well-developed.  Head:  Normocephalic and atraumatic. Eyes:  Without icterus, sclera clear and conjunctiva pink.  Ears:  Normal auditory acuity. Skin:  Intact without facial significant lesions or rashes. Neurologic:  Alert and oriented x4;  grossly normal neurologically. Psych:  Alert and cooperative. Normal mood and affect. Heme/Lymph/Immune: No excessive bruising noted.

## 2018-05-31 NOTE — ED Notes (Signed)
This RN acting as Art therapist and spoke with pt's significant otherCarloyn Manner (with pt's permission). Oren Binet an update on pt's course through the ED. Waiting on consult from GI and will update him once we know if pt is going to be discharged or admitted.

## 2018-05-31 NOTE — Progress Notes (Signed)
cc'ed to pcp °

## 2018-05-31 NOTE — ED Triage Notes (Signed)
Pt in c/o abd pain ongoing, pt evaluated for simiar symptoms Saturday and Sunday, pt told to come here by PCP for evaluation today per pt, reports vomiting x10 times in the last 24 hours, denies diarrhea, A&O x4

## 2018-05-31 NOTE — H&P (Signed)
History and Physical    Jarvis Knodel Citron PIR:[id number] DOB: 05-04-1980 DOA: 05/31/2018  Referring MD/NP/PA:   PCP: [name], MD   Patient coming from:  The patient is coming from home.  At baseline, pt is independent for most of ADL.        Chief Complaint: Nausea, vomiting, abdominal pain  HPI: Tracey Daniel is a 38 y.o. female with medical history significant of gastric outlet obstruction status post previous Botox injection, gastritis, gastroparesis, peptic ulcer disease, pyloric stenosis, pyloric spasm, hyperlipidemia, COPD, asthma, GERD, depression tobacco abuse, marijuana abuse, who presents with nausea vomiting and abdominal pain.  Patient states that she has been having worsening abdominal pain in the past 4 days, associated with nausea and vomiting.  She has vomited more than 10 times with nonbloody and non-biliary vomitus.  No diarrhea or constipation.  Denies fever or chills.  The abdominal pain is located in the central abdomen, constant, 10 out of 10 in severity, sharp, radiating to the back.  Patient denies chest pain, shortness of breath, cough, fever or chills.  No runny nose or sore throat.  She has burning on urination sometimes, Of note, she is also recently admitted to the hospital this past February and she had an EGD showing pyloric stenosis and she had Maloney dilation.  [facility] is followed [facility].  This is her third ER visit in the past 3 days. She had CT abdomen/pelvis on 5/10, which was negative for acute issues. She has been taking Reglan, Phenergan p.o. and suppositories without much improvement of her symptoms.   ED Course: pt was found to have WBC 12.2, negative urinalysis, pregnancy test beta-hCG 5.1 (weak positive), lipase 24, potassium 3.1, creatinine 1.02, BUN 12, temperature normal, bradycardia, oxygen saturation 100% on room air.  Patient is placed on MedSurg bed for observation.  [facility], Dr. Henrene Pastor was consulted  by EDP.  Review of Systems:   General: no fevers, chills, no body weight gain, has poor appetite, has fatigue HEENT: no blurry vision, hearing changes or sore throat Respiratory: no dyspnea, coughing, wheezing CV: no chest pain, no palpitations GI: has nausea, vomiting, abdominal pain, no diarrhea, constipation GU: no dysuria, burning on urination, increased urinary frequency, hematuria  Ext: no leg edema Neuro: no unilateral weakness, numbness, or tingling, no vision change or hearing loss Skin: no rash, no skin tear. MSK: No muscle spasm, no deformity, no limitation of range of movement in spin Heme: No easy bruising.  Travel history: No recent long distant travel.  Allergy:  Allergies  Allergen Reactions  . Benadryl [Diphenhydramine] Anaphylaxis  . Penicillins Other (See Comments)    Unknown Has patient had a PCN reaction causing immediate rash, facial/tongue/throat swelling, SOB or lightheadedness with hypotension Yes Has patient had a PCN reaction causing severe rash involving mucus membranes or skin necrosis: Yes-hives  Has patient had a PCN reaction that required hospitalization Yes Has patient had a PCN reaction occurring within the last 10 years: Yes If all of the above answers are "NO", then may proceed with Cephalosporin use.   . Latex Itching and Other (See Comments)    "burning" where the tape goes    Past Medical History:  Diagnosis Date  . Anemia of other chronic disease 11/29/2012  . Asthma   . Back pain, chronic   . Chronic abdominal pain   . Constipation   . COPD (chronic obstructive pulmonary disease) ([place])   . Cyst of skin  mid spine area  . Depression 2000   h/o suicidal ideation  . Gastric outlet obstruction   . Gastritis   . Gastroparesis   . Migraine   . Migraines   . Nausea and vomiting    recurrent  . Nicotine addiction   . Osteoporosis   . Peptic ulcer disease   . Pyloric spasm 03/30/2011  . Seasonal allergies   . Sinusitis   .  Substance abuse ([place]) 2008   marijuana  . Vitamin D deficiency     Past Surgical History:  Procedure Laterality Date  . BALLOON DILATION N/A 02/09/2013   Procedure: BALLOON DILATION;  Surgeon: Missy Sabins, MD;  Location: [facility];  Service: Endoscopy;  Laterality: N/A;  . BALLOON DILATION N/A 03/10/2018   Procedure: BALLOON DILATION;  Surgeon: Daneil Dolin, MD;  Location[facility];  Service: Endoscopy;  Laterality: N/A;  . BOTOX INJECTION N/A 02/09/2013   Procedure: BOTOX INJECTION;  Surgeon: Missy Sabins, MD;  Location: [facility];  Service: Endoscopy;  Laterality: N/A;  possible balloon  . BOTOX INJECTION N/A 10/24/2015   Procedure: BOTOX INJECTION;  Surgeon: Daneil Dolin, MD;  [facility];  Service: Endoscopy;  Laterality: N/A;  . BOTOX INJECTION N/A 03/10/2018   Procedure: BOTOX INJECTION;  Surgeon: Daneil Dolin, MD;  [facility];  Service: Endoscopy;  Laterality: N/A;  Melanie notified  . CARPAL TUNNEL RELEASE     left hand  . CHOLECYSTECTOMY     ?2002  . COLONOSCOPY WITH PROPOFOL N/A 09/20/2014   RMR: Normal ileo-colonoscopy  . ESOPHAGEAL DILATION  12/25/2010   Procedure: ESOPHAGEAL DILATION;  Surgeon: Dorothyann Peng, MD;  [facility];  Service: Endoscopy;;  . ESOPHAGOGASTRODUODENOSCOPY  12/25/2010   Dorothyann Peng, MD;  moderate gastritis, ?goo secondary to pylorspasm. BX showed reactive gstropathy no h.pyori, SB mucosa with intramucosal lymphocytosis and partial villous blunting (TTG 4.0 normal)  . ESOPHAGOGASTRODUODENOSCOPY N/A 11/14/2012   BXU:XYBFX hiatal hernia. Abnormal gastric mucosa of  uncertain significance-status post biopsy. Subjectively, patient may have recurrent symptomatic, pylorospasm.  . ESOPHAGOGASTRODUODENOSCOPY (EGD) WITH PROPOFOL N/A 02/09/2013   elongated stomach, partial lower esophageal ring widely patent. No obvious pyloric stenosis s/p Botox  . ESOPHAGOGASTRODUODENOSCOPY (EGD) WITH PROPOFOL N/A 10/24/2015    Procedure: ESOPHAGOGASTRODUODENOSCOPY (EGD) WITH PROPOFOL;  Surgeon: Daneil Dolin, MD;  [facility];  Service: Endoscopy;  Laterality: N/A;  830   . ESOPHAGOGASTRODUODENOSCOPY (EGD) WITH PROPOFOL N/A 03/10/2018   Dr. Gala Romney: normal esophagus, elongated, dilated stomach likely stigmata of slow gastric emptying due to narrowing of pyloric channel, s/p balloon dilatation of pyloric channel. Small hiatal hernia, normal duodenal bulb and second portion of duodenum  . laser surgery on cervix    . NASAL SEPTUM SURGERY  01/29/2017  . TOE SURGERY      Social History:  reports that she has been smoking cigarettes. She has a 1.50 pack-year smoking history. She has never used smokeless tobacco. She reports current drug use. Drug: Marijuana. She reports that she does not drink alcohol.  Family History:  Family History  Problem Relation Age of Onset  . Diabetes Father   . Liver disease Father        liver transplant at [facility], age 94  . Lung cancer Mother 15  . Heart disease Maternal Grandmother   . Parkinson's disease Maternal Grandfather   . Multiple sclerosis Sister 75  . Depression Sister 74  . Alcohol abuse Sister   .  Bipolar disorder Sister   . Schizophrenia Sister   . Colon cancer Neg Hx      Prior to Admission medications   Medication Sig Start Date End Date Taking? Authorizing Provider  acetaminophen (TYLENOL) 325 MG tablet Take 2 tablets (650 mg total) by mouth every 6 (six) hours as needed for mild pain (or Fever >/= 101). 03/11/18   Emokpae, Courage, MD  azelastine (ASTELIN) 0.1 % nasal spray USE 2 SPRAYS IN EACH NOSTRIL TWICE DAILY AS DIRECTED Patient taking differently: Place 2 sprays into both nostrils 2 (two) times daily.  09/13/17   [name], MD  BIOTIN PO Take 1 tablet by mouth daily.    [provider]  calcium-vitamin D (OSCAL WITH D) 500-200 MG-UNIT per tablet Take 1 tablet by mouth 2 (two) times daily.    [provider]  chlorhexidine  (PERIDEX) 0.12 % solution 5 mLs by Mouth Rinse route every morning.  05/22/16   [provider]  clobetasol cream (TEMOVATE) 4.94 % Apply 1 application topically 2 (two) times daily as needed (rash). Apply to rash    [provider]  dicyclomine (BENTYL) 20 MG tablet Take 1 tablet (20 mg total) by mouth 2 (two) times daily. 02/16/18   Henderly, Britni A, PA-C  estradiol (ESTRACE) 0.5 MG tablet Take 1 tablet (0.5 mg total) by mouth daily. Patient taking differently: Take 0.25 mg by mouth daily.  03/11/18   Roxan Hockey, MD  EVENING PRIMROSE OIL PO Take 1 tablet by mouth 2 (two) times daily.     [provider]  feeding supplement (ENSURE CLINICAL STRENGTH) LIQD Take 237 mLs by mouth 3 (three) times daily with meals. 01/01/11   Dhungel, Nishant, MD  fluticasone (FLONASE) 50 MCG/ACT nasal spray SHAKE LIQUID AND USE 2 SPRAYS IN EACH NOSTRIL DAILY Patient taking differently: Place 2 sprays into both nostrils daily.  11/18/17   [name], MD  gabapentin (NEURONTIN) 100 MG capsule TAKE ONE CAPSULE BY MOUTH THREE TIMES DAILY 05/25/18   [name], MD  LINZESS 290 MCG CAPS capsule TAKE 1 CAPSULE(290 MCG) BY MOUTH DAILY Patient taking differently: Take 290 mcg by mouth daily.  08/17/17   Annitta Needs, NP  loratadine (CLARITIN) 10 MG tablet Take 1 tablet (10 mg total) by mouth daily. 10/09/11   [name], MD  metoCLOPramide (REGLAN) 10 MG tablet Take 1 tablet (10 mg total) by mouth every 6 (six) hours. 02/22/18   Recardo Evangelist, PA-C  ondansetron (ZOFRAN ODT) 4 MG disintegrating tablet Take 1 tablet (4 mg total) by mouth every 8 (eight) hours as needed for nausea. 02/16/18   Henderly, Britni A, PA-C  ondansetron (ZOFRAN) 4 MG tablet Take 1 tablet (4 mg total) by mouth every 6 (six) hours as needed for nausea. 03/11/18   Roxan Hockey, MD  pantoprazole (PROTONIX) 40 MG tablet Take 1 tablet (40 mg total) by mouth daily. 03/11/18   Roxan Hockey, MD   Polyethyl Glycol-Propyl Glycol (SYSTANE OP) Place 2 drops into the left eye 3 (three) times daily.    [provider]  polyethylene glycol (MIRALAX / GLYCOLAX) packet Take 17 g by mouth 2 (two) times daily. 03/11/18   Roxan Hockey, MD  potassium chloride SA (K-DUR,KLOR-CON) 20 MEQ tablet One tablet once daily by mouth Patient taking differently: Take 20 mEq by mouth daily. One tablet once daily by mouth 08/02/17   [name], MD  pravastatin (PRAVACHOL) 20 MG tablet Take 1 tablet (20  mg total) by mouth daily. 03/11/18   Roxan Hockey, MD  PREMARIN vaginal cream INSERT 3.15 APPLICATORFUL VAGINALLY THREE TIMES WEEKLY AS DIRECTED Patient taking differently: Place 1.76 Applicatorfuls vaginally once. Takes every 3rd week of each month 12/15/16   Jonnie Kind, MD  progesterone (PROMETRIUM) 200 MG capsule TAKE 1 CAPSULE BY MOUTH EVERY DAY FOR 2 WEEKS EVERY THIRD MONTH: JANUARY, [place], JULY AND OCTOBER Patient taking differently: Take 200 mg by mouth See admin instructions. TAKE 1 CAPSULE BY MOUTH EVERY DAY FOR 2 WEEKS EVERY THIRD MONTH: Quentin Mulling AND OCTOBER 10/03/17   Jonnie Kind, MD  promethazine (PHENERGAN) 25 MG suppository Place 1 suppository (25 mg total) rectally every 6 (six) hours as needed for nausea or vomiting. 05/29/18   Tacy Learn, PA-C  propranolol (INDERAL) 10 MG tablet Take 1 tablet (10 mg total) by mouth 3 (three) times daily. 03/11/18   Roxan Hockey, MD  risperiDONE (RISPERDAL) 0.5 MG tablet Take 0.5 mg at bedtime by mouth.    [provider]  senna-docusate (SENOKOT-S) 8.6-50 MG tablet Take 2 tablets by mouth at bedtime. 03/11/18 03/11/19  Roxan Hockey, MD  sertraline (ZOLOFT) 50 MG tablet Take 50 mg by mouth at bedtime.     [provider]  sucralfate (CARAFATE) 1 g tablet TAKE 1 TABLET BY MOUTH EVERY MORNING AND AT BEDTIME AS NEEDED FOR STOMACH BURNING( MAY CRUSH 1 TABLET AND MIX IN APPLESAUCE) Patient taking  differently: Take 1 g by mouth 2 (two) times daily as needed (Stomach burning (may crush one tablet and mix in applesauce.)).  01/31/18   Annitta Needs, NP  SYMBICORT 80-4.5 MCG/ACT inhaler INHALE 2 PUFFS INTO THE LUNGS TWICE DAILY Patient taking differently: Inhale 2 puffs into the lungs 2 (two) times daily.  11/18/17   [name], MD  tiZANidine (ZANAFLEX) 4 MG tablet Take 1 tablet (4 mg total) by mouth 2 (two) times daily as needed (back or spasms). 03/24/18   [name], MD  VENTOLIN HFA 108 (90 Base) MCG/ACT inhaler INHALE 2 PUFFS INTO THE LUNGS EVERY 6 HOURS AS NEEDED FOR SHORTNESS OF BREATH Patient taking differently: Inhale 2 puffs into the lungs every 6 (six) hours as needed (shortness of breath.).  09/13/17   [name], MD  zonisamide (ZONEGRAN) 50 MG capsule Take 150 mg by mouth at bedtime.  02/21/17   [provider]    Physical Exam: Vitals:   05/31/18 1715 05/31/18 1730 05/31/18 1906 05/31/18 1915  BP: (!) 161/91 (!) 147/70 (!) 132/59 (!) 148/90  Pulse: (!) 48 (!) 51 70 (!) 51  Resp:    16  Temp:      TempSrc:      SpO2: 100% 100% 100% 100%  Weight:      Height:       General: Not in acute distress HEENT:       Eyes: PERRL, EOMI, no scleral icterus.       ENT: No discharge from the ears and nose, no pharynx injection, no tonsillar enlargement.        Neck: No JVD, no bruit, no mass felt. Heme: No neck lymph node enlargement. Cardiac: S1/S2, RRR, No murmurs, No gallops or rubs. Respiratory:  No rales, wheezing, rhonchi or rubs. GI: Soft, nondistended, has tenderness in epigastric and central abdomen, no rebound pain, no organomegaly, BS present. GU: No hematuria Ext: No pitting leg edema bilaterally. 2+DP/PT pulse bilaterally. Musculoskeletal: No joint deformities, No joint redness or warmth,  no limitation of ROM in spin. Skin: No rashes.  Neuro: Alert, oriented X3, cranial nerves II-XII grossly intact, moves all extremities normally.   Psych: Patient is not psychotic, no suicidal or hemocidal ideation.  Labs on Admission: I have personally reviewed following labs and imaging studies  CBC: Recent Labs  Lab 05/28/18 1750 05/29/18 0824 05/31/18 1438  WBC 14.5* 19.3* 12.2*  HGB 12.4 11.5* 11.1*  HCT 37.7 35.0* 34.3*  MCV 93.5 93.3 94.8  PLT 194 170 476   Basic Metabolic Panel: Recent Labs  Lab 05/28/18 1750 05/29/18 0824 05/31/18 1438 05/31/18 1730  NA 138 137 138  --   K 3.6 3.0* 3.1*  --   CL 104 102 103  --   CO2 20* 20* 24  --   GLUCOSE 130* 138* 98  --   BUN 16 11 12   --   CREATININE 0.89 0.89 1.02*  --   CALCIUM 10.1 9.8 9.4  --   MG  --   --   --  2.2   GFR: Estimated Creatinine Clearance: 68.6 mL/min (A) (by C-G formula based on SCr of 1.02 mg/dL (H)). Liver Function Tests: Recent Labs  Lab 05/28/18 1750 05/29/18 0824 05/31/18 1438  AST 22 28 43*  ALT 17 21 45*  ALKPHOS 77 75 72  BILITOT 0.7 0.6 1.0  PROT 7.9 7.8 7.2  ALBUMIN 5.0 4.8 4.5   Recent Labs  Lab 05/28/18 1750 05/29/18 0824 05/31/18 1438  LIPASE 25 36 24   No results for input(s): AMMONIA in the last 168 hours. Coagulation Profile: No results for input(s): INR, PROTIME in the last 168 hours. Cardiac Enzymes: No results for input(s): CKTOTAL, CKMB, CKMBINDEX, TROPONINI in the last 168 hours. BNP (last 3 results) No results for input(s): PROBNP in the last 8760 hours. HbA1C: No results for input(s): HGBA1C in the last 72 hours. CBG: No results for input(s): GLUCAP in the last 168 hours. Lipid Profile: No results for input(s): CHOL, HDL, LDLCALC, TRIG, CHOLHDL, LDLDIRECT in the last 72 hours. Thyroid Function Tests: No results for input(s): TSH, T4TOTAL, FREET4, T3FREE, THYROIDAB in the last 72 hours. Anemia Panel: No results for input(s): VITAMINB12, FOLATE, FERRITIN, TIBC, IRON, RETICCTPCT in the last 72 hours. Urine analysis:    Component Value Date/Time   COLORURINE YELLOW 05/31/2018 1758   APPEARANCEUR  HAZY (A) 05/31/2018 1758   LABSPEC 1.015 05/31/2018 1758   PHURINE 7.0 05/31/2018 1758   GLUCOSEU NEGATIVE 05/31/2018 1758   HGBUR NEGATIVE 05/31/2018 1758   HGBUR trace-intact 11/21/2008 1448   BILIRUBINUR NEGATIVE 05/31/2018 1758   BILIRUBINUR neg 07/31/2014 0941   KETONESUR 80 (A) 05/31/2018 1758   PROTEINUR NEGATIVE 05/31/2018 1758   UROBILINOGEN 0.2 11/08/2014 1730   NITRITE NEGATIVE 05/31/2018 1758   LEUKOCYTESUR NEGATIVE 05/31/2018 1758   Sepsis Labs: @LABRCNTIP (procalcitonin:4,lacticidven:4) )No results found for this or any previous visit (from the past 240 hour(s)).   Radiological Exams on Admission: No results found.   EKG:  Not done in ED, will get one.   Assessment/Plan Principal Problem:   Abdominal pain Active Problems:   Depression   GERD (gastroesophageal reflux disease)   COPD (chronic obstructive pulmonary disease) (H[facility])   Intractable vomiting   Hypokalemia   Gastric outlet obstruction   Anemia   Hyperlipemia   Leukocytosis   Tobacco abuse    Abdominal pain and Intractable vomiting: likely due to gastric outlet obstruction. She also has hx of gastritis, gastroparesis, peptic ulcer disease, pyloric stenosis and pyloric spasm.  Marijuana abuse is another differential diagnosis.  [facility], Dr. Henrene Pastor was consulted by EDP, will see in AM.  -Will place on med-surg bed for obs -Scheduled Reglan, PRN Zofran -IV fluid: 125 cc/h normal saline -IV pepcid 20 mg bid -also on carafate -continue home Bentyl -check UDS -f/u GI recommendations  Depression: -continue home zoloft  GERD (gastroesophageal reflux disease): -on IV pepcid  COPD (chronic obstructive pulmonary disease) ([place]): stable. -Dulera inhaler and albuterol inhaler prn  Hypokalemia: K= 3.1 on admission. - Repleted - Check Mg level-->2.2  Anemia: stable. Hgb 11.1 -f/u by CBC  Hyperlipemia: -Pravastatin  Tobacco abuse: -nicotine patch  Leukocytosis: no signs of infection.  Likely due to reactive reaction. UA negative. No fever. -follow up by CBC    DVT ppx: SCD Code Status: Full code Family Communication: None at bed side.   Disposition Plan:  Anticipate discharge back to previous home environment Consults called:  GI, Dr. Henrene Pastor Admission status: medical floor/obs     Date of Service 05/31/2018    [facility]   If 7PM-7AM, please contact night-coverage [email or web address] Password [facility] 05/31/2018, 7:52 PM

## 2018-05-31 NOTE — Patient Instructions (Signed)
Your health issues we discussed today were:   Abdominal pain, nausea, vomiting: 1. As we discussed, because you are unable to keep down food, fluids, medications and high risk for dehydration I recommend he go to the emergency department. 2. While there they may do an upper GI series.  I will enter an order for this to be completed shortly if it is not completed at the emergency department 3. Continue your Phenergan suppositories to help with your nausea 4. If your pyloric valve edema your stomach is still narrowed we will likely refer you to surgery for evaluation of surgery to correct the problem 5. Call us with any severe or significant worsening of your symptoms  Overall I recommend:  1. Continue other current medications 2. Call she has any questions or concerns 3. Follow-up in 6 weeks.   Because of recent events of COVID-19 ("Coronavirus"), follow CDC recommendations:  1. Wash your hand frequently 2. Avoid touching your face 3. Stay away from people who are sick 4. If you have symptoms such as fever, cough, shortness of breath then call your healthcare provider for further guidance 5. If you are sick, STAY AT HOME unless otherwise directed by your healthcare provider. 6. Follow directions from state and national officials regarding staying safe   At [facility] we value your feedback. You may receive a survey about your visit today. Please share your experience as we strive to create trusting relationships with our patients to provide genuine, compassionate, quality care.  We appreciate your understanding and patience as we review any laboratory studies, imaging, and other diagnostic tests that are ordered as we care for you. Our office policy is 5 business days for review of these results, and any emergent or urgent results are addressed in a timely manner for your best interest. If you do not hear from our office in 1 week, please contact us.   We also encourage  the use of MyChart, which contains your medical information for your review as well. If you are not enrolled in this feature, an access code is on this after visit summary for your convenience. Thank you for allowing [place] to be involved in your care.  It was great to see you today!  I hope you have a great day!!

## 2018-06-01 ENCOUNTER — Inpatient Hospital Stay (HOSPITAL_COMMUNITY): Payer: Medicare HMO | Admitting: Certified Registered Nurse Anesthetist

## 2018-06-01 ENCOUNTER — Encounter (HOSPITAL_COMMUNITY): Payer: Self-pay | Admitting: Certified Registered Nurse Anesthetist

## 2018-06-01 ENCOUNTER — Other Ambulatory Visit: Payer: Self-pay

## 2018-06-01 ENCOUNTER — Inpatient Hospital Stay (HOSPITAL_COMMUNITY): Payer: Medicare HMO

## 2018-06-01 ENCOUNTER — Encounter (HOSPITAL_COMMUNITY)
Admission: EM | Disposition: A | Discharge status: Home / Self Care | Payer: Self-pay | Source: Home / Self Care | Attending: Family Medicine

## 2018-06-01 DIAGNOSIS — M62838 Other muscle spasm: Secondary | ICD-10-CM | POA: Diagnosis present

## 2018-06-01 DIAGNOSIS — K3184 Gastroparesis: Secondary | ICD-10-CM | POA: Diagnosis present

## 2018-06-01 DIAGNOSIS — Z91040 Latex allergy status: Secondary | ICD-10-CM | POA: Diagnosis not present

## 2018-06-01 DIAGNOSIS — K3189 Other diseases of stomach and duodenum: Secondary | ICD-10-CM

## 2018-06-01 DIAGNOSIS — D509 Iron deficiency anemia, unspecified: Secondary | ICD-10-CM | POA: Diagnosis not present

## 2018-06-01 DIAGNOSIS — K449 Diaphragmatic hernia without obstruction or gangrene: Secondary | ICD-10-CM | POA: Diagnosis present

## 2018-06-01 DIAGNOSIS — E876 Hypokalemia: Secondary | ICD-10-CM | POA: Diagnosis present

## 2018-06-01 DIAGNOSIS — K311 Adult hypertrophic pyloric stenosis: Secondary | ICD-10-CM | POA: Diagnosis not present

## 2018-06-01 DIAGNOSIS — Z833 Family history of diabetes mellitus: Secondary | ICD-10-CM | POA: Diagnosis not present

## 2018-06-01 DIAGNOSIS — F419 Anxiety disorder, unspecified: Secondary | ICD-10-CM | POA: Diagnosis present

## 2018-06-01 DIAGNOSIS — Z818 Family history of other mental and behavioral disorders: Secondary | ICD-10-CM | POA: Diagnosis not present

## 2018-06-01 DIAGNOSIS — Z1159 Encounter for screening for other viral diseases: Secondary | ICD-10-CM | POA: Diagnosis not present

## 2018-06-01 DIAGNOSIS — F12188 Cannabis abuse with other cannabis-induced disorder: Secondary | ICD-10-CM | POA: Diagnosis present

## 2018-06-01 DIAGNOSIS — F329 Major depressive disorder, single episode, unspecified: Secondary | ICD-10-CM | POA: Diagnosis present

## 2018-06-01 DIAGNOSIS — R109 Unspecified abdominal pain: Secondary | ICD-10-CM | POA: Diagnosis not present

## 2018-06-01 DIAGNOSIS — Z8711 Personal history of peptic ulcer disease: Secondary | ICD-10-CM | POA: Diagnosis not present

## 2018-06-01 DIAGNOSIS — Z811 Family history of alcohol abuse and dependence: Secondary | ICD-10-CM | POA: Diagnosis not present

## 2018-06-01 DIAGNOSIS — R112 Nausea with vomiting, unspecified: Secondary | ICD-10-CM | POA: Diagnosis not present

## 2018-06-01 DIAGNOSIS — R1084 Generalized abdominal pain: Secondary | ICD-10-CM | POA: Diagnosis not present

## 2018-06-01 DIAGNOSIS — F17210 Nicotine dependence, cigarettes, uncomplicated: Secondary | ICD-10-CM | POA: Diagnosis present

## 2018-06-01 DIAGNOSIS — D72829 Elevated white blood cell count, unspecified: Secondary | ICD-10-CM | POA: Diagnosis present

## 2018-06-01 DIAGNOSIS — E785 Hyperlipidemia, unspecified: Secondary | ICD-10-CM | POA: Diagnosis present

## 2018-06-01 DIAGNOSIS — N179 Acute kidney failure, unspecified: Secondary | ICD-10-CM | POA: Diagnosis present

## 2018-06-01 DIAGNOSIS — K219 Gastro-esophageal reflux disease without esophagitis: Secondary | ICD-10-CM | POA: Diagnosis present

## 2018-06-01 DIAGNOSIS — Z880 Allergy status to penicillin: Secondary | ICD-10-CM | POA: Diagnosis not present

## 2018-06-01 DIAGNOSIS — G43909 Migraine, unspecified, not intractable, without status migrainosus: Secondary | ICD-10-CM | POA: Diagnosis present

## 2018-06-01 DIAGNOSIS — Z9049 Acquired absence of other specified parts of digestive tract: Secondary | ICD-10-CM | POA: Diagnosis not present

## 2018-06-01 DIAGNOSIS — Z888 Allergy status to other drugs, medicaments and biological substances status: Secondary | ICD-10-CM | POA: Diagnosis not present

## 2018-06-01 DIAGNOSIS — F209 Schizophrenia, unspecified: Secondary | ICD-10-CM | POA: Diagnosis present

## 2018-06-01 DIAGNOSIS — J449 Chronic obstructive pulmonary disease, unspecified: Secondary | ICD-10-CM | POA: Diagnosis present

## 2018-06-01 HISTORY — PX: BOTOX INJECTION: SHX5754

## 2018-06-01 HISTORY — PX: BIOPSY: SHX5522

## 2018-06-01 HISTORY — PX: ESOPHAGOGASTRODUODENOSCOPY (EGD) WITH PROPOFOL: SHX5813

## 2018-06-01 HISTORY — PX: BILIARY DILATION: SHX6850

## 2018-06-01 LAB — CBC
HCT: 31.4 % — ABNORMAL LOW (ref 36.0–46.0)
Hemoglobin: 10.2 g/dL — ABNORMAL LOW (ref 12.0–15.0)
MCH: 30.4 pg (ref 26.0–34.0)
MCHC: 32.5 g/dL (ref 30.0–36.0)
MCV: 93.7 fL (ref 80.0–100.0)
Platelets: 134 10*3/uL — ABNORMAL LOW (ref 150–400)
RBC: 3.35 MIL/uL — ABNORMAL LOW (ref 3.87–5.11)
RDW: 13.2 % (ref 11.5–15.5)
WBC: 8 10*3/uL (ref 4.0–10.5)
nRBC: 0 % (ref 0.0–0.2)

## 2018-06-01 LAB — BASIC METABOLIC PANEL
Anion gap: 7 (ref 5–15)
BUN: 12 mg/dL (ref 6–20)
CO2: 24 mmol/L (ref 22–32)
Calcium: 8.6 mg/dL — ABNORMAL LOW (ref 8.9–10.3)
Chloride: 109 mmol/L (ref 98–111)
Creatinine, Ser: 0.99 mg/dL (ref 0.44–1.00)
GFR calc Af Amer: >60 mL/min (ref >60)
GFR calc non Af Amer: >60 mL/min (ref >60)
Glucose, Bld: 88 mg/dL (ref 70–99)
Potassium: 3.4 mmol/L — ABNORMAL LOW (ref 3.5–5.1)
Sodium: 140 mmol/L (ref 135–145)

## 2018-06-01 LAB — PROTIME-INR
INR: 1.2 (ref 0.8–1.2)
Prothrombin Time: 15.3 seconds — ABNORMAL HIGH (ref 11.4–15.2)

## 2018-06-01 SURGERY — ESOPHAGOGASTRODUODENOSCOPY (EGD) WITH PROPOFOL
Anesthesia: Monitor Anesthesia Care

## 2018-06-01 MED ORDER — LIDOCAINE HCL (CARDIAC) PF 100 MG/5ML IV SOSY
PREFILLED_SYRINGE | INTRAVENOUS | Status: DC | PRN
  Administered 2018-06-01: 100 mg via INTRAVENOUS

## 2018-06-01 MED ORDER — FENTANYL CITRATE (PF) 100 MCG/2ML IJ SOLN
50.0000 ug | Freq: Once | INTRAMUSCULAR | Status: AC
  Administered 2018-06-01: 17:00:00 50 ug via INTRAVENOUS

## 2018-06-01 MED ORDER — PROPOFOL 10 MG/ML IV BOLUS
INTRAVENOUS | Status: DC | PRN
  Administered 2018-06-01: 30 mg via INTRAVENOUS
  Administered 2018-06-01: 20 mg via INTRAVENOUS
  Administered 2018-06-01: 45 mg via INTRAVENOUS
  Administered 2018-06-01 (×2): 20 mg via INTRAVENOUS
  Administered 2018-06-01 (×2): 30 mg via INTRAVENOUS
  Administered 2018-06-01 (×2): 20 mg via INTRAVENOUS

## 2018-06-01 MED ORDER — ONDANSETRON HCL 4 MG/2ML IJ SOLN
INTRAMUSCULAR | Status: AC
  Filled 2018-06-01: qty 2

## 2018-06-01 MED ORDER — ALUM & MAG HYDROXIDE-SIMETH 200-200-20 MG/5ML PO SUSP
30.0000 mL | Freq: Once | ORAL | Status: AC
  Administered 2018-06-01: 18:00:00 30 mL via ORAL
  Filled 2018-06-01: qty 30

## 2018-06-01 MED ORDER — LIDOCAINE VISCOUS HCL 2 % MT SOLN
15.0000 mL | Freq: Once | OROMUCOSAL | Status: AC
  Administered 2018-06-01: 15 mL via ORAL
  Filled 2018-06-01: qty 15

## 2018-06-01 MED ORDER — ONDANSETRON HCL 4 MG/2ML IJ SOLN
INTRAMUSCULAR | Status: DC | PRN
  Administered 2018-06-01: 4 mg via INTRAVENOUS

## 2018-06-01 MED ORDER — GLYCOPYRROLATE 0.2 MG/ML IJ SOLN
INTRAMUSCULAR | Status: DC | PRN
  Administered 2018-06-01: 0.1 mg via INTRAVENOUS

## 2018-06-01 MED ORDER — POTASSIUM CHLORIDE 10 MEQ/100ML IV SOLN
10.0000 meq | INTRAVENOUS | Status: AC
  Administered 2018-06-01 (×2): 10 meq via INTRAVENOUS
  Filled 2018-06-01 (×2): qty 100

## 2018-06-01 MED ORDER — PROPOFOL 500 MG/50ML IV EMUL
INTRAVENOUS | Status: DC | PRN
  Administered 2018-06-01 (×2): 75 ug/kg/min via INTRAVENOUS

## 2018-06-01 MED ORDER — ESMOLOL HCL 100 MG/10ML IV SOLN
INTRAVENOUS | Status: DC | PRN
  Administered 2018-06-01: 30 mg via INTRAVENOUS
  Administered 2018-06-01: 20 mg via INTRAVENOUS

## 2018-06-01 MED ORDER — PANTOPRAZOLE SODIUM 40 MG IV SOLR
40.0000 mg | Freq: Two times a day (BID) | INTRAVENOUS | Status: DC
  Administered 2018-06-01 – 2018-06-03 (×5): 40 mg via INTRAVENOUS
  Filled 2018-06-01 (×5): qty 40

## 2018-06-01 MED ORDER — SODIUM CHLORIDE (PF) 0.9 % IJ SOLN
100.0000 [IU] | Freq: Once | INTRAMUSCULAR | Status: AC
  Administered 2018-06-01: 100 [IU] via SUBMUCOSAL
  Filled 2018-06-01: qty 100

## 2018-06-01 MED ORDER — FENTANYL CITRATE (PF) 100 MCG/2ML IJ SOLN
25.0000 ug | Freq: Once | INTRAMUSCULAR | Status: AC
  Administered 2018-06-01: 25 ug via INTRAVENOUS

## 2018-06-01 MED ORDER — FENTANYL CITRATE (PF) 100 MCG/2ML IJ SOLN
INTRAMUSCULAR | Status: AC
  Filled 2018-06-01: qty 2

## 2018-06-01 SURGICAL SUPPLY — 15 items

## 2018-06-01 NOTE — Progress Notes (Signed)
Patient having significant pain/nausea post procedure, notified MD Mansouraty, after he spoke with patient he ordered a KUB and chest xray. MD Mansouraty spoke with radiologist and confirmed negative for post procedure complications. MD also ordered for her to have telemetry on the floor and an EKG, all of that relayed to floor nurse.

## 2018-06-01 NOTE — Anesthesia Postprocedure Evaluation (Signed)
Anesthesia Post Note  Patient: Tracey Daniel  Procedure(s) Performed: ESOPHAGOGASTRODUODENOSCOPY (EGD) WITH PROPOFOL (N/A ) BILIARY DILATION BIOPSY BOTOX INJECTION     Patient location during evaluation: PACU Anesthesia Type: MAC Level of consciousness: awake and alert Pain management: pain level controlled Vital Signs Assessment: post-procedure vital signs reviewed and stable Respiratory status: spontaneous breathing, nonlabored ventilation, respiratory function stable and patient connected to nasal cannula oxygen Cardiovascular status: stable and blood pressure returned to baseline Postop Assessment: no apparent nausea or vomiting Anesthetic complications: no    Last Vitals:  Vitals:   06/01/18 1655 06/01/18 1942  BP: (!) 194/114 117/77  Pulse: 75 (!) 54  Resp: 15 16  Temp:  37 C  SpO2: 100% 100%    Last Pain:  Vitals:   06/01/18 1942  TempSrc: Oral  PainSc:                  [name] P [name]

## 2018-06-01 NOTE — Transfer of Care (Signed)
Immediate Anesthesia Transfer of Care Note  Patient: Tracey Daniel  Procedure(s) Performed: ESOPHAGOGASTRODUODENOSCOPY (EGD) WITH PROPOFOL (N/A ) BILIARY DILATION SCLEROTHERAPY BIOPSY  Patient Location: Endoscopy Unit  Anesthesia Type:MAC  Level of Consciousness: awake, alert  and oriented  Airway & Oxygen Therapy: Patient Spontanous Breathing and Patient connected to nasal cannula oxygen  Post-op Assessment: Report given to RN, Post -op Vital signs reviewed and stable and Patient moving all extremities  Post vital signs: Reviewed and stable  Last Vitals:  Vitals Value Taken Time  BP 145/96 06/01/2018  4:16 PM  Temp 37 C 06/01/2018  4:05 PM  Pulse 77 06/01/2018  4:17 PM  Resp 11 06/01/2018  4:17 PM  SpO2 100 % 06/01/2018  4:17 PM  Vitals shown include unvalidated device data.  Last Pain:  Vitals:   06/01/18 1605  TempSrc: Oral  PainSc: 5          Complications: No apparent anesthesia complications

## 2018-06-01 NOTE — Progress Notes (Signed)
GI Brief Progress Note  After EGD was complete as per notation in the EMR,The patient was noted to have a Abdominal pain and chest pain. Stat KUB and chest x-ray were performed. I reviewed this personally and then reviewed them with radiology.  No evidence of preparation or free air. Patient was treated with fentanyl and plan was for her to be sent to the floor for continued monitoring. Telemetry was ordered. And an EKG could be o rdered to be reviewed by the medical service. Pain control as primary medical service. Although there is very little evidence to suggest perforation, if the patient continues to have significant pain then cross-sectional imaging with a CT chest/abdomen/pelvis should be considered with IV and PO contrast. Based on the EKG read consider the role of cardiac troponin markers I will defer that to the primary medical service to consider ba sed on the findings on the EKG. Around 7 PM I was contacted by the patient's primary daytime nurse. I asked the nurse to please reach out to the medical service to reevaluate the patient but they should feel free to reach out to the G.I. On call service or my cell if necessary.  As noted above if the patient has persistent pain then consideration of a cross-sectional CT chest/abdomen/pelvis should be performed. This would be to rule out  Perforation or other issue should be considered post-procedure.  Patient prior to her endoscopy had abdominal pain that was in midepigastrium nature. She was given a Maalox medication to be administered on the floor and put a nurse this has not had much effect after her EGD.  Please feel free to reach out to the G.I. service if necessary but if the remains concerned then should see notation above in regards to next steps in her e valuation.  Justice Britain, MD [facility]

## 2018-06-01 NOTE — Progress Notes (Signed)
Pt is continuing to have epigastric and mid back pain. Pt is nauseous and has been vomiting. Doctor is aware, will continue to monitor.

## 2018-06-01 NOTE — Progress Notes (Signed)
PROGRESS NOTE    Tracey Daniel  BPZ:[id number] DOB: [birth year] DOA: 05/31/2018 PCP: [name], MD   Brief Narrative: Tracey Daniel is a 38 y.o. female with medical history significant of gastric outlet obstruction status post previous Botox injection, gastritis, gastroparesis, peptic ulcer disease, pyloric stenosis, pyloric spasm, hyperlipidemia, COPD, asthma, GERD, depression tobacco abuse, marijuana abuse. Patient presented secondary to worsening nausea/vomiting, abdominal pain.   Assessment & Plan:   Principal Problem:   Abdominal pain Active Problems:   Depression   GERD (gastroesophageal reflux disease)   COPD (chronic obstructive pulmonary disease) (HCC)   Intractable vomiting   Hypokalemia   Gastric outlet obstruction   Anemia   Hyperlipemia   Leukocytosis   Tobacco abuse   Nausea/vomiting Abdominal pain History of gastric outlet obstruction, s/p EGD with balloon dilation of pyloric channel in February. Also consideration of cannabinoid hyperemesis syndrome. Still with nausea this morning preventing her from taking adequate oral intake. Currently NPO as well pending GI recommendations this morning. -Continue IV fluids -GI recommendations -Continue Reglan IV since NPO for procedure and continued oral intake intolerance  Depression -Continue Zoloft  GERD On Protonix as an outpatient.Started on IV Pepcid -Continue Pepcid IV while NPO  COPD -Continue Dulera and albuterol prn  Hypokalemia Improved with supplementation -Will give potassium IV today  Anemia Stable. Baseline of 11.  Tobacco abuse -Continue nicotine patch  Leukocytosis Resolved.  Marijuana use Chronic. Possibly contributing to recurrent nausea/vomiting/abdominal pain episodes. Cessation discussed previously.   DVT prophylaxis: SCDs Code Status:   Code Status: Full Code Family Communication: None Disposition Plan: Discharge pending GI workup in addition to advancement  of diet pending improvement of nausea/vomiting   Consultants:   [facility]  Procedures:   None  Antimicrobials:  None    Subjective: Nausea today. Unable to tolerate oral intake.  Objective: Vitals:   05/31/18 2000 05/31/18 2030 05/31/18 2115 06/01/18 0505  BP: 133/85 131/66 137/88 (!) 145/75  Pulse:   (!) 51 (!) 45  Resp:   17 17  Temp:   98.9 F (37.2 C) 98.5 F (36.9 C)  TempSrc:   Oral Oral  SpO2:   100% 100%  Weight:      Height:        Intake/Output Summary (Last 24 hours) at 06/01/2018 1050 Last data filed at 06/01/2018 0833 Gross per 24 hour  Intake 903.89 ml  Output -  Net 903.89 ml   Filed Weights   05/31/18 1431  Weight: 66.2 kg    Examination:  General exam: Appears calm and comfortable Respiratory system: Clear to auscultation. Respiratory effort normal. Cardiovascular system: S1 & S2 heard, RRR. No murmurs, rubs, gallops or clicks. Gastrointestinal system: Abdomen is nondistended, soft and nontender. No organomegaly or masses felt. Normal bowel sounds heard. Central nervous system: Alert and oriented. No focal neurological deficits. Extremities: No edema. No calf tenderness Skin: No cyanosis. No rashes Psychiatry: Judgement and insight appear normal. Mood & affect appropriate.     Data Reviewed: I have personally reviewed following labs and imaging studies  CBC: Recent Labs  Lab 05/28/18 1750 05/29/18 0824 05/31/18 1438 06/01/18 0309  WBC 14.5* 19.3* 12.2* 8.0  HGB 12.4 11.5* 11.1* 10.2*  HCT 37.7 35.0* 34.3* 31.4*  MCV 93.5 93.3 94.8 93.7  PLT 194 170 150 242*   Basic Metabolic Panel: Recent Labs  Lab 05/28/18 1750 05/29/18 0824 05/31/18 1438 05/31/18 1730 06/01/18 0309  NA 138 137 138  --  140  K 3.6  3.0* 3.1*  --  3.4*  CL 104 102 103  --  109  CO2 20* 20* 24  --  24  GLUCOSE 130* 138* 98  --  88  BUN 16 11 12   --  12  CREATININE 0.89 0.89 1.02*  --  0.99  CALCIUM 10.1 9.8 9.4  --  8.6*  MG  --   --   --  2.2  --     GFR: Estimated Creatinine Clearance: 70.7 mL/min (by C-G formula based on SCr of 0.99 mg/dL). Liver Function Tests: Recent Labs  Lab 05/28/18 1750 05/29/18 0824 05/31/18 1438  AST 22 28 43*  ALT 17 21 45*  ALKPHOS 77 75 72  BILITOT 0.7 0.6 1.0  PROT 7.9 7.8 7.2  ALBUMIN 5.0 4.8 4.5   Recent Labs  Lab 05/28/18 1750 05/29/18 0824 05/31/18 1438  LIPASE 25 36 24   No results for input(s): AMMONIA in the last 168 hours. Coagulation Profile: Recent Labs  Lab 06/01/18 0309  INR 1.2   Cardiac Enzymes: No results for input(s): CKTOTAL, CKMB, CKMBINDEX, TROPONINI in the last 168 hours. BNP (last 3 results) No results for input(s): PROBNP in the last 8760 hours. HbA1C: No results for input(s): HGBA1C in the last 72 hours. CBG: No results for input(s): GLUCAP in the last 168 hours. Lipid Profile: No results for input(s): CHOL, HDL, LDLCALC, TRIG, CHOLHDL, LDLDIRECT in the last 72 hours. Thyroid Function Tests: No results for input(s): TSH, T4TOTAL, FREET4, T3FREE, THYROIDAB in the last 72 hours. Anemia Panel: No results for input(s): VITAMINB12, FOLATE, FERRITIN, TIBC, IRON, RETICCTPCT in the last 72 hours. Sepsis Labs: Recent Labs  Lab 05/28/18 1750  LATICACIDVEN 2.3*    Recent Results (from the past 240 hour(s))  SARS Coronavirus 2 (CEPHEID - Performed in [place] hospital lab), Hosp Order     Status: None   Collection Time: 05/31/18  7:47 PM  Result Value Ref Range Status   SARS Coronavirus 2 NEGATIVE NEGATIVE Final    Comment: (NOTE) If result is NEGATIVE SARS-CoV-2 target nucleic acids are NOT DETECTED. The SARS-CoV-2 RNA is generally detectable in upper and lower  respiratory specimens during the acute phase of infection. The lowest  concentration of SARS-CoV-2 viral copies this assay can detect is 250  copies / mL. A negative result does not preclude SARS-CoV-2 infection  and should not be used as the sole basis for treatment or other  patient  management decisions.  A negative result may occur with  improper specimen collection / handling, submission of specimen other  than nasopharyngeal swab, presence of viral mutation(s) within the  areas targeted by this assay, and inadequate number of viral copies  (<250 copies / mL). A negative result must be combined with clinical  observations, patient history, and epidemiological information. If result is POSITIVE SARS-CoV-2 target nucleic acids are DETECTED. The SARS-CoV-2 RNA is generally detectable in upper and lower  respiratory specimens dur ing the acute phase of infection.  Positive  results are indicative of active infection with SARS-CoV-2.  Clinical  correlation with patient history and other diagnostic information is  necessary to determine patient infection status.  Positive results do  not rule out bacterial infection or co-infection with other viruses. If result is PRESUMPTIVE POSTIVE SARS-CoV-2 nucleic acids MAY BE PRESENT.   A presumptive positive result was obtained on the submitted specimen  and confirmed on repeat testing.  While 2019 novel coronavirus  (SARS-CoV-2) nucleic acids may be present in the  submitted sample  additional confirmatory testing may be necessary for epidemiological  and / or clinical management purposes  to differentiate between  SARS-CoV-2 and other Sarbecovirus currently known to infect humans.  If clinically indicated additional testing with an alternate test  methodology [phone]) is advised. The SARS-CoV-2 RNA is generally  detectable in upper and lower respiratory sp ecimens during the acute  phase of infection. The expected result is Negative. Fact Sheet for Patients:  StrictlyIdeas.no Fact Sheet for Healthcare Providers: BankingDealers.co.za This test is not yet approved or cleared by the [place] FDA and has been authorized for detection and/or diagnosis of SARS-CoV-2 by FDA under  an Emergency Use Authorization (EUA).  This EUA will remain in effect (meaning this test can be used) for the duration of the COVID-19 declaration under Section 564(b)(1) of the Act, 21 U.S.C. section 360bbb-3(b)(1), unless the authorization is terminated or revoked sooner. Performed at [facility], [place] [address], [place], [place] [address]          Radiology Studies: No results found.      Scheduled Meds: . azelastine  2 spray Each Nare BID  . calcium-vitamin D  1 tablet Oral BID  . chlorhexidine  5 mL Mouth Rinse q morning - 10a  . dicyclomine  20 mg Oral BID  . fluticasone  2 spray Each Nare Daily  . gabapentin  100 mg Oral TID  . linaclotide  290 mcg Oral QAC breakfast  . loratadine  10 mg Oral Daily  . metoCLOPramide (REGLAN) injection  5 mg Intravenous Q8H  . mometasone-formoterol  2 puff Inhalation BID  . nicotine  21 mg Transdermal Daily  . polyethylene glycol  17 g Oral BID  . pravastatin  20 mg Oral Daily  . propranolol  10 mg Oral TID  . risperiDONE  0.5 mg Oral QHS  . senna-docusate  2 tablet Oral QHS  . sertraline  50 mg Oral QHS  . zonisamide  150 mg Oral QHS   Continuous Infusions: . sodium chloride 125 mL/hr at 06/01/18 0834  . famotidine (PEPCID) IV 20 mg (06/01/18 0850)     LOS: 0 days     Cordelia Poche, MD [facility] 06/01/2018, 10:50 AM  If 7PM-7AM, please contact night-coverage [email or web address]

## 2018-06-01 NOTE — TOC Initial Note (Signed)
Transition of Care [facility]) - Initial/Assessment Note    Patient Details  Name: Tracey Daniel MRN: [id number] Date of Birth: 1980/12/10  [facility] CM/SW Contact:    Marilu Favre, RN Phone Number: 06/01/2018, 12:37 PM  Clinical Narrative:                  Patient from home with boyfriend and his mother. Patient has insurance , transportation to appointments and can afford medications.   PCP is DR Moshe Cipro.  Will continue to follow. Expected Discharge Plan: Home/Self Care Barriers to Discharge: Continued Medical Work up   Patient Goals and CMS Choice Patient states their goals for this hospitalization and ongoing recovery are:: to go home  CMS Medicare.gov Compare Post Acute Care list provided to:: Patient Choice offered to / list presented to : NA  Expected Discharge Plan and Services Expected Discharge Plan: Home/Self Care In-house Referral: NA Discharge Planning Services: CM Consult   Living arrangements for the past 2 months: Single Family Home                 DME Arranged: N/A DME Agency: NA       HH Arranged: NA HH Agency: NA        Prior Living Arrangements/Services Living arrangements for the past 2 months: Single Family Home Lives with:: Significant Other   Do you feel safe going back to the place where you live?: Yes      Need for Family Participation in Patient Care: Yes (Comment) Care giver support system in place?: Yes (comment)   Criminal Activity/Legal Involvement Pertinent to Current Situation/Hospitalization: No - Comment as needed  Activities of Daily Living Home Assistive Devices/Equipment: None ADL Screening (condition at time of admission) Patient's cognitive ability adequate to safely complete daily activities?: Yes Is the patient deaf or have difficulty hearing?: No Does the patient have difficulty seeing, even when wearing glasses/contacts?: No Does the patient have difficulty concentrating, remembering, or making  decisions?: No Patient able to express need for assistance with ADLs?: Yes Does the patient have difficulty dressing or bathing?: No Independently performs ADLs?: Yes (appropriate for developmental age) Does the patient have difficulty walking or climbing stairs?: No Weakness of Legs: None Weakness of Arms/Hands: None  Permission Sought/Granted Permission sought to share information with : Case Manager Permission granted to share information with : Yes, Verbal Permission Granted              Emotional Assessment Appearance:: Appears stated age Attitude/Demeanor/Rapport: Engaged Affect (typically observed): Accepting, Appropriate Orientation: : Oriented to Self, Oriented to Place, Oriented to  Time, Oriented to Situation   Psych Involvement: No (comment)  Admission diagnosis:  Hypokalemia [E87.6] Intractable vomiting with nausea, unspecified vomiting type [R11.2] Patient Active Problem List   Diagnosis Date Noted  . Abdominal pain 05/31/2018  . Leukocytosis 05/31/2018  . Tobacco abuse 05/31/2018  . Cannabinoid hyperemesis syndrome ([place]) 03/11/2018  . Intractable abdominal pain 03/07/2018  . Dermatitis 02/01/2018  . Hemorrhoids 02/22/2017  . Nicotine dependence, cigarettes, with other nicotine-induced disorders 12/12/2016  . Thoracic spine pain 11/23/2016  . Nasal obstruction 11/23/2016  . Hyperlipemia 10/02/2015  . Premature ovarian failure 02/04/2015  . Pregnancy as incidental finding, low positive preg test, considered pituitary production of HCG 02/04/2015  . Abdominal pain, epigastric 08/27/2014  . Vitamin D deficiency 05/16/2014  . Alopecia 08/01/2013  . Asthma, cough variant 12/01/2012  . 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 Marijuana use 11/14/2012  .  Seasonal allergies 11/14/2012  . Nausea without vomiting 11/09/2012  . Constipation 12/02/2011  . Gastroparesis 05/01/2011  . Pyloric spasm/Stenosis 03/30/2011  . Hip pain, right  03/02/2011  . Anemia 01/30/2011  . Gastric outlet obstruction 01/01/2011  . Intractable vomiting 12/24/2010  . Hypokalemia 12/24/2010  . COPD (chronic obstructive pulmonary disease) ([place]) 09/09/2010  . GERD (gastroesophageal reflux disease) 08/21/2010  . Headache 03/13/2008  . AMENORRHEA, SECONDARY 09/27/2007  . ECZEMA 09/27/2007  . Depression 07/01/2007  . Back pain with radiation 07/01/2007  . CLOSED FRACTURE OF METATARSAL BONE 01/27/2007   PCP:  [name], MD Pharmacy:   [facility] [place], [address] [place] [address] Phone: [phone] Fax: [phone]     Social Determinants of Health (SDOH) Interventions    Readmission Risk Interventions Readmission Risk Prevention Plan 06/01/2018  Transportation Screening Complete  PCP or Specialist Appt within 3-5 Days Complete  HRI or [place] Complete  Social Work Consult for [place] Planning/Counseling Complete  Palliative Care Screening Not Applicable  Medication Review Press photographer) Referral to Pharmacy  Some recent data might be hidden

## 2018-06-01 NOTE — Plan of Care (Signed)
  Problem: Health Behavior/Discharge Planning: Goal: Ability to manage health-related needs will improve Outcome: Progressing   Problem: Nutrition: Goal: Adequate nutrition will be maintained Outcome: Progressing   

## 2018-06-01 NOTE — Care Management Obs Status (Signed)
[place] NOTIFICATION   Patient Details  Name: Omolara Carol Corle MRN: [id number] Date of Birth: September 03, 1980   Medicare Observation Status Notification Given:  Yes    Marilu Favre, RN 06/01/2018, 12:39 PM

## 2018-06-01 NOTE — Consult Note (Addendum)
Consultation  Referring Provider: [facility] [place] Primary Care Physician:  [name], MD Primary Gastroenterologist:  Dr.Rourk  Reason for Consultation:  Nausea, Vomiting, Abdominal pain  HPI: Tracey Daniel is a 38 y.o. female, established with Dr. Holland [facility]  GI, with history of pyloric stenosis.  Patient was sent to the emergency room by Dr. Roseanne Kaufman office at your virtual visit yesterday and complaints of 4-day history of constant upper abdominal pain with multiple episodes of nausea and vomiting and inability to keep down p.o.'s or meds.  She had had 2 previous ER visits with a previous couple of days. Patient says she has been having some gradually worsening GI symptoms over the past couple of weeks primarily with queasiness and mild discomfort.  She has not had any fever or chills, hematemesis, diarrhea or melena. She stays on a regimen of Protonix 40 mg p.o. twice daily, Reglan 10 mg 4 times daily and Linzess for chronic constipation.  She is also been taking Voltaren for shoulder pain. She has had several upper and upper endoscopies over the past 6 or 7 years.  She had EGD with Botox injections with Dr. Amedeo Plenty in 2017 no at that time the pyloric channel grossly looked normal.  She had EGD in February 2020 with Dr. Gala Romney with finding of a dilated elongated stomach, small hiatal hernia and pyloric stenosis which was balloon dilated 18 to 20 mm. Patient says that she had benefit from both the Botox injections in the past and the dilations.  She says usually takes a few days and gradually she can advance her diet back to a regular diet. Overall her weight has been stable over the past several months.  Her pain is described as upper abdominal, aching squeezing constant discomfort that radiates around into her sides and back. Labs on 05/29/2018 showed WBC of 19.3, hemoglobin 11.5, potassium of 3.  Yesterday on admit WBC of 12.2 hemoglobin 11.1 potassium 3.1 and today  WBC of 8 hemoglobin 10.2 and potassium 3.4. CT of the abdomen pelvis with contrast on 05/29/2018 read as unremarkable post cholecystectomy,  Today she feels better after hydration and IV administration of her meds.  She has not had any nausea and vomiting and is in fact hungry   Past Medical History:  Diagnosis Date  . Anemia of other chronic disease 11/29/2012  . Asthma   . Back pain, chronic   . Chronic abdominal pain   . Constipation   . COPD (chronic obstructive pulmonary disease) ([place])   . Cyst of skin    mid spine area  . Depression 2000   h/o suicidal ideation  . Gastric outlet obstruction   . Gastritis   . Gastroparesis   . Migraine   . Migraines   . Nausea and vomiting    recurrent  . Nicotine addiction   . Osteoporosis   . Peptic ulcer disease   . Pyloric spasm 03/30/2011  . Seasonal allergies   . Sinusitis   . Substance abuse ([place]) 2008   marijuana  . Vitamin D deficiency     Past Surgical History:  Procedure Laterality Date  . BALLOON DILATION N/A 02/09/2013   Procedure: BALLOON DILATION;  Surgeon: Missy Sabins, MD;  Location: [facility];  Service: Endoscopy;  Laterality: N/A;  . BALLOON DILATION N/A 03/10/2018   Procedure: BALLOON DILATION;  Surgeon: Daneil Dolin, MD;  Location: [facility];  Service: Endoscopy;  Laterality: N/A;  . BOTOX INJECTION N/A 02/09/2013  Procedure: BOTOX INJECTION;  Surgeon: Missy Sabins, MD;  Location: [facility];  Service: Endoscopy;  Laterality: N/A;  possible balloon  . BOTOX INJECTION N/A 10/24/2015   Procedure: BOTOX INJECTION;  Surgeon: Daneil Dolin, MD;  Location: [facility];  Service: Endoscopy;  Laterality: N/A;  . BOTOX INJECTION N/A 03/10/2018   Procedure: BOTOX INJECTION;  Surgeon: Daneil Dolin, MD;  [facility];  Service: Endoscopy;  Laterality: N/A;  Melanie notified  . CARPAL TUNNEL RELEASE     left hand  . CHOLECYSTECTOMY     ?2002  . COLONOSCOPY WITH PROPOFOL N/A 09/20/2014   RMR:  Normal ileo-colonoscopy  . ESOPHAGEAL DILATION  12/25/2010   Procedure: ESOPHAGEAL DILATION;  Surgeon: Dorothyann Peng, MD;  Location: [facility];  Service: Endoscopy;;  . ESOPHAGOGASTRODUODENOSCOPY  12/25/2010   Dorothyann Peng, MD;  moderate gastritis, ?goo secondary to pylorspasm. BX showed reactive gstropathy no h.pyori, SB mucosa with intramucosal lymphocytosis and partial villous blunting (TTG 4.0 normal)  . ESOPHAGOGASTRODUODENOSCOPY N/A 11/14/2012   YNW:GNFAO hiatal hernia. Abnormal gastric mucosa of  uncertain significance-status post biopsy. Subjectively, patient may have recurrent symptomatic, pylorospasm.  . ESOPHAGOGASTRODUODENOSCOPY (EGD) WITH PROPOFOL N/A 02/09/2013   elongated stomach, partial lower esophageal ring widely patent. No obvious pyloric stenosis s/p Botox  . ESOPHAGOGASTRODUODENOSCOPY (EGD) WITH PROPOFOL N/A 10/24/2015   Procedure: ESOPHAGOGASTRODUODENOSCOPY (EGD) WITH PROPOFOL;  Surgeon: Daneil Dolin, MD;  [facility];  Service: Endoscopy;  Laterality: N/A;  830   . ESOPHAGOGASTRODUODENOSCOPY (EGD) WITH PROPOFOL N/A 03/10/2018   Dr. Gala Romney: normal esophagus, elongated, dilated stomach likely stigmata of slow gastric emptying due to narrowing of pyloric channel, s/p balloon dilatation of pyloric channel. Small hiatal hernia, normal duodenal bulb and second portion of duodenum  . laser surgery on cervix    . NASAL SEPTUM SURGERY  01/29/2017  . TOE SURGERY      Prior to Admission medications   Medication Sig Start Date End Date Taking? Authorizing Provider  acetaminophen (TYLENOL) 325 MG tablet Take 2 tablets (650 mg total) by mouth every 6 (six) hours as needed for mild pain (or Fever >/= 101). 03/11/18  Yes Emokpae, Courage, MD  diclofenac (VOLTAREN) 75 MG EC tablet Take 75 mg by mouth 2 (two) times a day. 05/16/18  Yes [provider]  estradiol (ESTRACE) 1 MG tablet Take 1 mg by mouth daily. 04/12/18  Yes [provider]  fluticasone  (FLONASE) 50 MCG/ACT nasal spray SHAKE LIQUID AND USE 2 SPRAYS IN EACH NOSTRIL DAILY Patient taking differently: Place 2 sprays into both nostrils daily.  11/18/17  Yes [name], MD  gabapentin (NEURONTIN) 100 MG capsule TAKE ONE CAPSULE BY MOUTH THREE TIMES DAILY Patient taking differently: Take 100 mg by mouth 3 (three) times daily.  05/25/18  Yes [name], MD  LINZESS 290 MCG CAPS capsule TAKE 1 CAPSULE(290 MCG) BY MOUTH DAILY Patient taking differently: Take 290 mcg by mouth daily.  08/17/17  Yes Annitta Needs, NP  metoCLOPramide (REGLAN) 10 MG tablet Take 1 tablet (10 mg total) by mouth every 6 (six) hours. 02/22/18  Yes Recardo Evangelist, PA-C  pantoprazole (PROTONIX) 40 MG tablet Take 1 tablet (40 mg total) by mouth daily. Patient taking differently: Take 40 mg by mouth 2 (two) times daily.  03/11/18  Yes Emokpae, Courage, MD  potassium chloride SA (K-DUR,KLOR-CON) 20 MEQ tablet One tablet once daily by mouth Patient taking differently: Take 20 mEq by mouth daily.  One tablet once daily by mouth 08/02/17  Yes [name], MD  pravastatin (PRAVACHOL) 20 MG tablet Take 1 tablet (20 mg total) by mouth daily. 03/11/18  Yes Roxan Hockey, MD  promethazine (PHENERGAN) 25 MG suppository Place 1 suppository (25 mg total) rectally every 6 (six) hours as needed for nausea or vomiting. 05/29/18  Yes Tacy Learn, PA-C  propranolol (INDERAL) 10 MG tablet Take 1 tablet (10 mg total) by mouth 3 (three) times daily. 03/11/18  Yes Emokpae, Courage, MD  risperiDONE (RISPERDAL) 0.5 MG tablet Take 0.5 mg at bedtime by mouth.   Yes [provider]  sertraline (ZOLOFT) 50 MG tablet Take 50 mg by mouth at bedtime.    Yes [provider]  tiZANidine (ZANAFLEX) 4 MG tablet Take 1 tablet (4 mg total) by mouth 2 (two) times daily as needed (back or spasms). 03/24/18  Yes [name], MD  zonisamide (ZONEGRAN) 50 MG capsule Take 150 mg by mouth at bedtime.  02/21/17  Yes  [provider]  estradiol (ESTRACE) 0.5 MG tablet Take 1 tablet (0.5 mg total) by mouth daily. Patient not taking: Reported on 05/31/2018 03/11/18   Roxan Hockey, MD  feeding supplement (ENSURE CLINICAL STRENGTH) LIQD Take 237 mLs by mouth 3 (three) times daily with meals. 01/01/11   Dhungel, Flonnie Overman, MD    Current Facility-Administered Medications  Medication Dose Route Frequency Provider Last Rate Last Dose  . 0.9 %  sodium chloride infusion   Intravenous Continuous Ivor Costa, MD 125 mL/hr at 06/01/18 [phone]    . acetaminophen (TYLENOL) tablet 650 mg  650 mg Oral Q6H PRN Ivor Costa, MD       Or  . acetaminophen (TYLENOL) suppository 650 mg  650 mg Rectal Q6H PRN Ivor Costa, MD      . albuterol (PROVENTIL) (2.5 MG/3ML) 0.083% nebulizer solution 3 mL  3 mL Inhalation Q6H PRN Ivor Costa, MD      . azelastine (ASTELIN) 0.1 % nasal spray 2 spray  2 spray Each Nare BID Ivor Costa, MD   2 spray at 06/01/18 0840  . calcium-vitamin D (OSCAL WITH D) 500-200 MG-UNIT per tablet 1 tablet  1 tablet Oral BID Ivor Costa, MD   1 tablet at 06/01/18 0847  . chlorhexidine (PERIDEX) 0.12 % solution 5 mL  5 mL Mouth Rinse q morning - 10a Ivor Costa, MD   5 mL at 06/01/18 0846  . dicyclomine (BENTYL) tablet 20 mg  20 mg Oral BID Ivor Costa, MD   20 mg at 06/01/18 0839  . famotidine (PEPCID) IVPB 20 mg premix  20 mg Intravenous Q12H Ivor Costa, MD 100 mL/hr at 06/01/18 0850 20 mg at 06/01/18 0850  . fluticasone (FLONASE) 50 MCG/ACT nasal spray 2 spray  2 spray Each Nare Daily Ivor Costa, MD   2 spray at 06/01/18 0841  . gabapentin (NEURONTIN) capsule 100 mg  100 mg Oral TID Ivor Costa, MD   100 mg at 06/01/18 0848  . linaclotide (LINZESS) capsule 290 mcg  290 mcg Oral QAC breakfast Skeet Simmer, RPH   290 mcg at 06/01/18 5053  . loratadine (CLARITIN) tablet 10 mg  10 mg Oral Daily Ivor Costa, MD   10 mg at 06/01/18 0847  . metoCLOPramide (REGLAN) injection 5 mg  5 mg Intravenous Q8H Ivor Costa, MD   5  mg at 06/01/18 0601  . mometasone-formoterol (DULERA) 100-5 MCG/ACT inhaler 2 puff  2 puff Inhalation BID Ivor Costa, MD  2 puff at 06/01/18 0906  . morphine 2 MG/ML injection 2 mg  2 mg Intravenous Q4H PRN Ivor Costa, MD   2 mg at 06/01/18 0601  . nicotine (NICODERM CQ - dosed in mg/24 hours) patch 21 mg  21 mg Transdermal Daily Ivor Costa, MD   21 mg at 06/01/18 0847  . ondansetron (ZOFRAN) injection 4 mg  4 mg Intravenous Q8H PRN Ivor Costa, MD      . polyethylene glycol (MIRALAX / GLYCOLAX) packet 17 g  17 g Oral BID Ivor Costa, MD      . potassium chloride 10 mEq in 100 mL IVPB  10 mEq Intravenous Q1 Hr x 2 Mariel Aloe, MD      . pravastatin (PRAVACHOL) tablet 20 mg  20 mg Oral Daily Ivor Costa, MD   20 mg at 06/01/18 0848  . propranolol (INDERAL) tablet 10 mg  10 mg Oral TID Ivor Costa, MD   10 mg at 06/01/18 0841  . risperiDONE (RISPERDAL) tablet 0.5 mg  0.5 mg Oral QHS Ivor Costa, MD   0.5 mg at 05/31/18 2346  . senna-docusate (Senokot-S) tablet 2 tablet  2 tablet Oral QHS Ivor Costa, MD   2 tablet at 05/31/18 2344  . sertraline (ZOLOFT) tablet 50 mg  50 mg Oral QHS Ivor Costa, MD   50 mg at 05/31/18 2345  . sucralfate (CARAFATE) tablet 1 g  1 g Oral BID PRN Ivor Costa, MD      . tiZANidine (ZANAFLEX) tablet 4 mg  4 mg Oral BID PRN Ivor Costa, MD   4 mg at 06/01/18 0839  . zonisamide (ZONEGRAN) capsule 150 mg  150 mg Oral QHS Ivor Costa, MD   150 mg at 05/31/18 2349    Allergies as of 05/31/2018 - Review Complete 05/31/2018  Allergen Reaction Noted  . Benadryl [diphenhydramine] Anaphylaxis 03/25/2014  . Penicillins Other (See Comments)   . Latex Itching and Other (See Comments) 11/30/2011    Family History  Problem Relation Age of Onset  . Diabetes Father   . Liver disease Father        liver transplant at [facility], age 4  . Lung cancer Mother 7  . Heart disease Maternal Grandmother   . Parkinson's disease Maternal Grandfather   . Multiple sclerosis Sister 60  . Depression  Sister 64  . Alcohol abuse Sister   . Bipolar disorder Sister   . Schizophrenia Sister   . Colon cancer Neg Hx     Social History   Socioeconomic History  . Marital status: Significant Other    Spouse name: Not on file  . Number of children: 0  . Years of education: 9  . Highest education level: 9th grade  Occupational History  . Occupation: disability    Employer: UNEMPLOYED  Social Needs  . Financial resource strain: Not very hard  . Food insecurity:    Worry: Sometimes true    Inability: Sometimes true  . Transportation needs:    Medical: No    Non-medical: No  Tobacco Use  . Smoking status: Current Every Day Smoker    Packs/day: 0.10    Years: 15.00    Pack years: 1.50    Types: Cigarettes  . Smokeless tobacco: Never Used  . Tobacco comment: 2 per day  Substance and Sexual Activity  . Alcohol use: No    Alcohol/week: 0.0 standard drinks  . Drug use: Yes    Types: Marijuana    Comment: As of  05/31/2018: last use 3 weeks ago  . Sexual activity: Yes    Birth control/protection: None  Lifestyle  . Physical activity:    Days per week: 3 days    Minutes per session: 30 min  . Stress: Not at all  Relationships  . Social connections:    Talks on phone: More than three times a week    Gets together: Twice a week    Attends religious service: Never    Active member of club or organization: No    Attends meetings of clubs or organizations: Never    Relationship status: Living with partner  . Intimate partner violence:    Fear of current or ex partner: No    Emotionally abused: No    Physically abused: No    Forced sexual activity: No  Other Topics Concern  . Not on file  Social History Narrative  . Not on file    Review of Systems: Pertinent positive and negative review of systems were noted in the above HPI section.  All other review of systems was otherwise negative.  Physical Exam: Vital signs in last 24 hours: Temp:  [98.4 F (36.9 C)-98.9 F (37.2  C)] 98.5 F (36.9 C) (05/13 0505) Pulse Rate:  [45-70] 45 (05/13 0505) Resp:  [16-17] 17 (05/13 0505) BP: (131-169)/(59-101) 145/75 (05/13 0505) SpO2:  [100 %] 100 % (05/13 0505) Weight:  [66.2 kg] 66.2 kg (05/12 1431) Last BM Date: 05/28/18 General:   Alert,  Well-developed, well-nourished, African-American female pleasant and cooperative in NAD Head:  Normocephalic and atraumatic. Eyes:  Sclera clear, no icterus.   Conjunctiva pink. Ears:  Normal auditory acuity. Nose:  No deformity, discharge,  or lesions. Mouth:  No deformity or lesions.   Neck:  Supple; no masses or thyromegaly. Lungs:  Clear throughout to auscultation.   No wheezes, crackles, or rhonchi.  Heart:  Regular rate and rhythm; no murmurs, clicks, rubs,  or gallops. Abdomen:  Soft, minimally tender epigastrium, BS active,nonpalp mass or hsm no succussion splash, incisional ports from prior cholecystectomy.   Rectal:  Deferred  Msk:  Symmetrical without gross deformities. . Pulses:  Normal pulses noted. Extremities:  Without clubbing or edema. Neurologic:  Alert and  oriented x4;  grossly normal neurologically. Skin:  Intact without significant lesions or rashes.. Psych:  Alert and cooperative. Normal mood and affect.  Intake/Output from previous day: 05/12 0701 - 05/13 0700 In: 903.9 [P.O.:40; I.V.:613.9; IV Piggyback:250] Out: -  Intake/Output this shift: No intake/output data recorded.  Lab Results: Recent Labs    05/31/18 1438 06/01/18 0309  WBC 12.2* 8.0  HGB 11.1* 10.2*  HCT 34.3* 31.4*  PLT 150 134*   BMET Recent Labs    05/31/18 1438 06/01/18 0309  NA 138 140  K 3.1* 3.4*  CL 103 109  CO2 24 24  GLUCOSE 98 88  BUN 12 12  CREATININE 1.02* 0.99  CALCIUM 9.4 8.6*   LFT Recent Labs    05/31/18 1438  PROT 7.2  ALBUMIN 4.5  AST 43*  ALT 45*  ALKPHOS 72  BILITOT 1.0   PT/INR Recent Labs    06/01/18 0309  LABPROT 15.3*  INR 1.2   Hepatitis Panel No results for input(s):  HEPBSAG, HCVAB, HEPAIGM, HEPBIGM in the last 72 hours.   IMPRESSION:  #12 38 year old African-American female with history of previously diagnosed pyloric stenosis who has required periodic dilations and/or Botox injections over the past 5 to 6 years.  Patient most recently had  EGD with balloon dilation in February 2020, then developed recurrence of her abdominal pain nausea and vomiting with inability to keep down any p.o.'s 5 days ago.  Symptoms are very consistent with outlet obstruction secondary to current pyloric stenosis  #2 ongoing NSAID use #3 acute kidney injury resolved #4 hypokalemia secondary to above-correcting #5 GERD #6 status post cholecystectomy #7  History of asthma and COPD #8  Depression #9 constipation/chronic  PLAN: #1 keep n.p.o. #2 DC IV Pepcid and switch to IV Protonix 40 mg every 12 hours #3 continue IV Reglan #4 Have scheduled patient for upper endoscopy and probable balloon dilation of the pylorus with Dr. Rush Landmark this afternoon at 2:30.  Procedure was discussed in detail with the patient including indications risks and benefits and she is agreeable to proceed. #5  Consider UGI  tomorrow depending on findings of EGD today  If  indeed she has evidence of recurrent stenosis on EGD today, she will benefit from surgical consultation to discuss definitive management with pyloroplasty.   Carrie Usery  06/01/2018, 11:40 AM

## 2018-06-01 NOTE — Interval H&P Note (Signed)
History and Physical Interval Note:  06/01/2018 3:02 PM  Tracey Daniel  has presented today for surgery, with the diagnosis of pyloric stenosis.  The various methods of treatment have been discussed with the patient and family. After consideration of risks, benefits and other options for treatment, the patient has consented to  Procedure(s): ESOPHAGOGASTRODUODENOSCOPY (EGD) WITH PROPOFOL (N/A) as a surgical intervention.  The patient's history has been reviewed, patient examined, no change in status, stable for surgery.  I have reviewed the patient's chart and labs.  Questions were answered to the patient's satisfaction.    Will evaluate for PUD, as well as pyloric stenosis.  Consider dilation of pylorus.  Possible botox as previously used.     [employer]

## 2018-06-01 NOTE — Anesthesia Preprocedure Evaluation (Signed)
Anesthesia Evaluation  Patient identified by MRN, date of birth, ID band Patient awake    Reviewed: Allergy & Precautions, NPO status , Patient's Chart, lab work & pertinent test results  Airway Mallampati: II  TM Distance: >3 FB Neck ROM: Full    Dental  (+) Teeth Intact, Dental Advisory Given   Pulmonary Current Smoker,    breath sounds clear to auscultation       Cardiovascular  Rhythm:Regular Rate:Normal     Neuro/Psych    GI/Hepatic   Endo/Other    Renal/GU      Musculoskeletal   Abdominal   Peds  Hematology   Anesthesia Other Findings   Reproductive/Obstetrics                             Anesthesia Physical Anesthesia Plan  ASA: III  Anesthesia Plan: MAC   Post-op Pain Management:    Induction: Intravenous  PONV Risk Score and Plan: Ondansetron and Dexamethasone  Airway Management Planned: Natural Airway and Nasal Cannula  Additional Equipment:   Intra-op Plan:   Post-operative Plan:   Informed Consent: I have reviewed the patients History and Physical, chart, labs and discussed the procedure including the risks, benefits and alternatives for the proposed anesthesia with the patient or authorized representative who has indicated his/her understanding and acceptance.     Dental advisory given  Plan Discussed with: CRNA and Anesthesiologist  Anesthesia Plan Comments:         Anesthesia Quick Evaluation

## 2018-06-01 NOTE — Op Note (Signed)
[facility] Patient Name: Tracey Daniel Procedure Date : 06/01/2018 MRN: [id number] Attending MD: Justice Britain , MD Date of Birth: 13-Jan-1981 CSN: [id number] Age: 38 Admit Type: Inpatient Procedure:                Upper GI endoscopy Indications:              Epigastric abdominal pain, Nausea with vomiting,                            Gastroparesis (Idiopathic) Providers:                Justice Britain, MD, Glori Bickers, RN, Grace Isaac, RN, Marguerita Merles, Technician, Charolette Child, Technician Referring MD:             [facility] Medicines:                Monitored Anesthesia Care Complications:            No immediate complications. Estimated Blood Loss:     Estimated blood loss was minimal. Procedure:                Pre-Anesthesia Assessment:                           - Prior to the procedure, a History and Physical                            was performed, and patient medications and                            allergies were reviewed. The patient's tolerance of                            previous anesthesia was also reviewed. The risks                            and benefits of the procedure and the sedation                            options and risks were discussed with the patient.                            All questions were answered, and informed consent                            was obtained. Prior Anticoagulants: The patient has                            taken no previous anticoagulant or antiplatelet                            agents. ASA  Grade Assessment: II - A patient with                            mild systemic disease. After reviewing the risks                            and benefits, the patient was deemed in                            satisfactory condition to undergo the procedure.                           After obtaining informed consent, the endoscope was             passed under direct vision. Throughout the                            procedure, the patient's blood pressure, pulse, and                            oxygen saturations were monitored continuously. The                            GIF-H190 ([id number]) Olympus gastroscope was                            introduced through the mouth, and advanced to the                            second part of duodenum. The upper GI endoscopy was                            accomplished without difficulty. The patient                            tolerated the procedure. Scope In: Scope Out: Findings:      No gross lesions were noted in the entire esophagus. Biopsies were taken       with a cold forceps for histology to rule out EoE as cause of recurrent       N/V.      A small hiatal hernia was present.      Localized severely erythematous mucosa with bleeding on contact was       found in the cardia, in the gastric fundus and in the gastric body.      Multiple dispersed diminutive erosions were found in the gastric body.      The pylorus was normal and able to be traversed with the standard       endoscope. A TTS dilator was passed through the scope. Dilation with a       15-16.5-18 mm and an 18-19-20 mm pyloric balloon dilator was performed.       No evidence of a mucosal rent was noted even after a 20 mm dilation.       Area was successfully injected with 100 units botulinum toxin in 4       quadrants.  No other gross lesions were noted in the entire examined stomach.       Biopsies were taken with a cold forceps for histology and Helicobacter       pylori testing.      No gross lesions were noted in the duodenal bulb, in the first portion       of the duodenum and in the second portion of the duodenum. Biopsies were       taken with a cold forceps for histology and rule out enteropathy. Impression:               - No gross lesions in esophagus. Biopsied.                           - Small  hiatal hernia.                           - Erythematous mucosa in the cardia, gastric fundus                            and gastric body.                           - Erosive gastropathy. Biopsied for HP.                           - Normal pylorus. Dilated. Injected with botulinum                            toxin.                           - No gross lesions in the duodenal bulb, in the                            first portion of the duodenum and in the second                            portion of the duodenum. Biopsied. Recommendation:           - The patient will be observed post-procedure,                            until all discharge criteria are met.                           - Return patient to hospital ward for ongoing care.                           - Clear liquid diet.                           - Observe patient's clinical course.                           - Await pathology results.                           -  IV PPI BID while unable to take orals and then                            transition to PO PPI.                           - Continue current medication regimen.                           - Will consider role of repeat SF-GES.                           - Hemoglobin A1c to be done.                           - Can consider role of surgical consultation but                            with significantly patent pylorus, it is not clear                            how much benefit this would occur but                           - The findings and recommendations were discussed                            with the patient.                           - The findings and recommendations were discussed                            with the referring physician. Procedure Code(s):        --- Professional ---                           [phone], Esophagogastroduodenoscopy, flexible,                            transoral; with dilation of gastric/duodenal                            stricture(s)  (eg, balloon, bougie) Diagnosis Code(s):        --- Professional ---                           K31.89, Other diseases of stomach and duodenum                           R10.13, Epigastric pain                           R11.2, Nausea with vomiting, unspecified CPT copyright 2019 [employer] Medical Association. All rights reserved. The codes documented in this report are preliminary and upon coder review may  be revised to meet current compliance requirements. Justice Britain, MD 06/01/2018 4:23:13 PM Number of Addenda: 0

## 2018-06-01 NOTE — Plan of Care (Signed)

## 2018-06-01 NOTE — H&P (View-Only) (Signed)
Consultation  Referring Provider: [facility] [place] Primary Care Physician:  [name], MD Primary Gastroenterologist:  Dr.Rourk  Reason for Consultation:  Nausea, Vomiting, Abdominal pain  HPI: Tracey Daniel is a 38 y.o. female, established with Dr. Holland [facility]  GI, with history of pyloric stenosis.  Patient was sent to the emergency room by Dr. Roseanne Kaufman office at your virtual visit yesterday and complaints of 4-day history of constant upper abdominal pain with multiple episodes of nausea and vomiting and inability to keep down p.o.'s or meds.  She had had 2 previous ER visits with a previous couple of days. Patient says she has been having some gradually worsening GI symptoms over the past couple of weeks primarily with queasiness and mild discomfort.  She has not had any fever or chills, hematemesis, diarrhea or melena. She stays on a regimen of Protonix 40 mg p.o. twice daily, Reglan 10 mg 4 times daily and Linzess for chronic constipation.  She is also been taking Voltaren for shoulder pain. She has had several upper and upper endoscopies over the past 6 or 7 years.  She had EGD with Botox injections with Dr. Amedeo Plenty in 2017 no at that time the pyloric channel grossly looked normal.  She had EGD in February 2020 with Dr. Gala Romney with finding of a dilated elongated stomach, small hiatal hernia and pyloric stenosis which was balloon dilated 18 to 20 mm. Patient says that she had benefit from both the Botox injections in the past and the dilations.  She says usually takes a few days and gradually she can advance her diet back to a regular diet. Overall her weight has been stable over the past several months.  Her pain is described as upper abdominal, aching squeezing constant discomfort that radiates around into her sides and back. Labs on 05/29/2018 showed WBC of 19.3, hemoglobin 11.5, potassium of 3.  Yesterday on admit WBC of 12.2 hemoglobin 11.1 potassium 3.1 and today  WBC of 8 hemoglobin 10.2 and potassium 3.4. CT of the abdomen pelvis with contrast on 05/29/2018 read as unremarkable post cholecystectomy,  Today she feels better after hydration and IV administration of her meds.  She has not had any nausea and vomiting and is in fact hungry   Past Medical History:  Diagnosis Date  . Anemia of other chronic disease 11/29/2012  . Asthma   . Back pain, chronic   . Chronic abdominal pain   . Constipation   . COPD (chronic obstructive pulmonary disease) ([place])   . Cyst of skin    mid spine area  . Depression 2000   h/o suicidal ideation  . Gastric outlet obstruction   . Gastritis   . Gastroparesis   . Migraine   . Migraines   . Nausea and vomiting    recurrent  . Nicotine addiction   . Osteoporosis   . Peptic ulcer disease   . Pyloric spasm 03/30/2011  . Seasonal allergies   . Sinusitis   . Substance abuse ([place]) 2008   marijuana  . Vitamin D deficiency     Past Surgical History:  Procedure Laterality Date  . BALLOON DILATION N/A 02/09/2013   Procedure: BALLOON DILATION;  Surgeon: Missy Sabins, MD;  Location: [facility];  Service: Endoscopy;  Laterality: N/A;  . BALLOON DILATION N/A 03/10/2018   Procedure: BALLOON DILATION;  Surgeon: Daneil Dolin, MD;  Location: [facility];  Service: Endoscopy;  Laterality: N/A;  . BOTOX INJECTION N/A 02/09/2013  Procedure: BOTOX INJECTION;  Surgeon: Missy Sabins, MD;  Location: [facility];  Service: Endoscopy;  Laterality: N/A;  possible balloon  . BOTOX INJECTION N/A 10/24/2015   Procedure: BOTOX INJECTION;  Surgeon: Daneil Dolin, MD;  Location: [facility];  Service: Endoscopy;  Laterality: N/A;  . BOTOX INJECTION N/A 03/10/2018   Procedure: BOTOX INJECTION;  Surgeon: Daneil Dolin, MD;  Location: [facility];  Service: Endoscopy;  Laterality: N/A;  Melanie notified  . CARPAL TUNNEL RELEASE     left hand  . CHOLECYSTECTOMY     ?2002  . COLONOSCOPY WITH PROPOFOL N/A 09/20/2014   RMR:  Normal ileo-colonoscopy  . ESOPHAGEAL DILATION  12/25/2010   Procedure: ESOPHAGEAL DILATION;  Surgeon: Dorothyann Peng, MD;  Location: [facility];  Service: Endoscopy;;  . ESOPHAGOGASTRODUODENOSCOPY  12/25/2010   Dorothyann Peng, MD;  moderate gastritis, ?goo secondary to pylorspasm. BX showed reactive gstropathy no h.pyori, SB mucosa with intramucosal lymphocytosis and partial villous blunting (TTG 4.0 normal)  . ESOPHAGOGASTRODUODENOSCOPY N/A 11/14/2012   WSF:KCLEX hiatal hernia. Abnormal gastric mucosa of  uncertain significance-status post biopsy. Subjectively, patient may have recurrent symptomatic, pylorospasm.  . ESOPHAGOGASTRODUODENOSCOPY (EGD) WITH PROPOFOL N/A 02/09/2013   elongated stomach, partial lower esophageal ring widely patent. No obvious pyloric stenosis s/p Botox  . ESOPHAGOGASTRODUODENOSCOPY (EGD) WITH PROPOFOL N/A 10/24/2015   Procedure: ESOPHAGOGASTRODUODENOSCOPY (EGD) WITH PROPOFOL;  Surgeon: Daneil Dolin, MD;  [facility]: [facility];  Service: Endoscopy;  Laterality: N/A;  830   . ESOPHAGOGASTRODUODENOSCOPY (EGD) WITH PROPOFOL N/A 03/10/2018   Dr. Gala Romney: normal esophagus, elongated, dilated stomach likely stigmata of slow gastric emptying due to narrowing of pyloric channel, s/p balloon dilatation of pyloric channel. Small hiatal hernia, normal duodenal bulb and second portion of duodenum  . laser surgery on cervix    . NASAL SEPTUM SURGERY  01/29/2017  . TOE SURGERY      Prior to Admission medications   Medication Sig Start Date End Date Taking? Authorizing Provider  acetaminophen (TYLENOL) 325 MG tablet Take 2 tablets (650 mg total) by mouth every 6 (six) hours as needed for mild pain (or Fever >/= 101). 03/11/18  Yes Emokpae, Courage, MD  diclofenac (VOLTAREN) 75 MG EC tablet Take 75 mg by mouth 2 (two) times a day. 05/16/18  Yes [provider]  estradiol (ESTRACE) 1 MG tablet Take 1 mg by mouth daily. 04/12/18  Yes [provider]  fluticasone  (FLONASE) 50 MCG/ACT nasal spray SHAKE LIQUID AND USE 2 SPRAYS IN EACH NOSTRIL DAILY Patient taking differently: Place 2 sprays into both nostrils daily.  11/18/17  Yes [name], MD  gabapentin (NEURONTIN) 100 MG capsule TAKE ONE CAPSULE BY MOUTH THREE TIMES DAILY Patient taking differently: Take 100 mg by mouth 3 (three) times daily.  05/25/18  Yes [name], MD  LINZESS 290 MCG CAPS capsule TAKE 1 CAPSULE(290 MCG) BY MOUTH DAILY Patient taking differently: Take 290 mcg by mouth daily.  08/17/17  Yes Annitta Needs, NP  metoCLOPramide (REGLAN) 10 MG tablet Take 1 tablet (10 mg total) by mouth every 6 (six) hours. 02/22/18  Yes Recardo Evangelist, PA-C  pantoprazole (PROTONIX) 40 MG tablet Take 1 tablet (40 mg total) by mouth daily. Patient taking differently: Take 40 mg by mouth 2 (two) times daily.  03/11/18  Yes Emokpae, Courage, MD  potassium chloride SA (K-DUR,KLOR-CON) 20 MEQ tablet One tablet once daily by mouth Patient taking differently: Take 20 mEq by mouth daily.  One tablet once daily by mouth 08/02/17  Yes [name], MD  pravastatin (PRAVACHOL) 20 MG tablet Take 1 tablet (20 mg total) by mouth daily. 03/11/18  Yes Roxan Hockey, MD  promethazine (PHENERGAN) 25 MG suppository Place 1 suppository (25 mg total) rectally every 6 (six) hours as needed for nausea or vomiting. 05/29/18  Yes Tacy Learn, PA-C  propranolol (INDERAL) 10 MG tablet Take 1 tablet (10 mg total) by mouth 3 (three) times daily. 03/11/18  Yes Emokpae, Courage, MD  risperiDONE (RISPERDAL) 0.5 MG tablet Take 0.5 mg at bedtime by mouth.   Yes [provider]  sertraline (ZOLOFT) 50 MG tablet Take 50 mg by mouth at bedtime.    Yes [provider]  tiZANidine (ZANAFLEX) 4 MG tablet Take 1 tablet (4 mg total) by mouth 2 (two) times daily as needed (back or spasms). 03/24/18  Yes [name], MD  zonisamide (ZONEGRAN) 50 MG capsule Take 150 mg by mouth at bedtime.  02/21/17  Yes  [provider]  estradiol (ESTRACE) 0.5 MG tablet Take 1 tablet (0.5 mg total) by mouth daily. Patient not taking: Reported on 05/31/2018 03/11/18   Roxan Hockey, MD  feeding supplement (ENSURE CLINICAL STRENGTH) LIQD Take 237 mLs by mouth 3 (three) times daily with meals. 01/01/11   Dhungel, Flonnie Overman, MD    Current Facility-Administered Medications  Medication Dose Route Frequency Provider Last Rate Last Dose  . 0.9 %  sodium chloride infusion   Intravenous Continuous Ivor Costa, MD 125 mL/hr at 06/01/18 [phone]    . acetaminophen (TYLENOL) tablet 650 mg  650 mg Oral Q6H PRN Ivor Costa, MD       Or  . acetaminophen (TYLENOL) suppository 650 mg  650 mg Rectal Q6H PRN Ivor Costa, MD      . albuterol (PROVENTIL) (2.5 MG/3ML) 0.083% nebulizer solution 3 mL  3 mL Inhalation Q6H PRN Ivor Costa, MD      . azelastine (ASTELIN) 0.1 % nasal spray 2 spray  2 spray Each Nare BID Ivor Costa, MD   2 spray at 06/01/18 0840  . calcium-vitamin D (OSCAL WITH D) 500-200 MG-UNIT per tablet 1 tablet  1 tablet Oral BID Ivor Costa, MD   1 tablet at 06/01/18 0847  . chlorhexidine (PERIDEX) 0.12 % solution 5 mL  5 mL Mouth Rinse q morning - 10a Ivor Costa, MD   5 mL at 06/01/18 0846  . dicyclomine (BENTYL) tablet 20 mg  20 mg Oral BID Ivor Costa, MD   20 mg at 06/01/18 0839  . famotidine (PEPCID) IVPB 20 mg premix  20 mg Intravenous Q12H Ivor Costa, MD 100 mL/hr at 06/01/18 0850 20 mg at 06/01/18 0850  . fluticasone (FLONASE) 50 MCG/ACT nasal spray 2 spray  2 spray Each Nare Daily Ivor Costa, MD   2 spray at 06/01/18 0841  . gabapentin (NEURONTIN) capsule 100 mg  100 mg Oral TID Ivor Costa, MD   100 mg at 06/01/18 0848  . linaclotide (LINZESS) capsule 290 mcg  290 mcg Oral QAC breakfast Skeet Simmer, RPH   290 mcg at 06/01/18 3419  . loratadine (CLARITIN) tablet 10 mg  10 mg Oral Daily Ivor Costa, MD   10 mg at 06/01/18 0847  . metoCLOPramide (REGLAN) injection 5 mg  5 mg Intravenous Q8H Ivor Costa, MD   5  mg at 06/01/18 0601  . mometasone-formoterol (DULERA) 100-5 MCG/ACT inhaler 2 puff  2 puff Inhalation BID Ivor Costa, MD  2 puff at 06/01/18 0906  . morphine 2 MG/ML injection 2 mg  2 mg Intravenous Q4H PRN Ivor Costa, MD   2 mg at 06/01/18 0601  . nicotine (NICODERM CQ - dosed in mg/24 hours) patch 21 mg  21 mg Transdermal Daily Ivor Costa, MD   21 mg at 06/01/18 0847  . ondansetron (ZOFRAN) injection 4 mg  4 mg Intravenous Q8H PRN Ivor Costa, MD      . polyethylene glycol (MIRALAX / GLYCOLAX) packet 17 g  17 g Oral BID Ivor Costa, MD      . potassium chloride 10 mEq in 100 mL IVPB  10 mEq Intravenous Q1 Hr x 2 Mariel Aloe, MD      . pravastatin (PRAVACHOL) tablet 20 mg  20 mg Oral Daily Ivor Costa, MD   20 mg at 06/01/18 0848  . propranolol (INDERAL) tablet 10 mg  10 mg Oral TID Ivor Costa, MD   10 mg at 06/01/18 0841  . risperiDONE (RISPERDAL) tablet 0.5 mg  0.5 mg Oral QHS Ivor Costa, MD   0.5 mg at 05/31/18 2346  . senna-docusate (Senokot-S) tablet 2 tablet  2 tablet Oral QHS Ivor Costa, MD   2 tablet at 05/31/18 2344  . sertraline (ZOLOFT) tablet 50 mg  50 mg Oral QHS Ivor Costa, MD   50 mg at 05/31/18 2345  . sucralfate (CARAFATE) tablet 1 g  1 g Oral BID PRN Ivor Costa, MD      . tiZANidine (ZANAFLEX) tablet 4 mg  4 mg Oral BID PRN Ivor Costa, MD   4 mg at 06/01/18 0839  . zonisamide (ZONEGRAN) capsule 150 mg  150 mg Oral QHS Ivor Costa, MD   150 mg at 05/31/18 2349    Allergies as of 05/31/2018 - Review Complete 05/31/2018  Allergen Reaction Noted  . Benadryl [diphenhydramine] Anaphylaxis 03/25/2014  . Penicillins Other (See Comments)   . Latex Itching and Other (See Comments) 11/30/2011    Family History  Problem Relation Age of Onset  . Diabetes Father   . Liver disease Father        liver transplant at [facility], age 40  . Lung cancer Mother 22  . Heart disease Maternal Grandmother   . Parkinson's disease Maternal Grandfather   . Multiple sclerosis Sister 36  . Depression  Sister 57  . Alcohol abuse Sister   . Bipolar disorder Sister   . Schizophrenia Sister   . Colon cancer Neg Hx     Social History   Socioeconomic History  . Marital status: Significant Other    Spouse name: Not on file  . Number of children: 0  . Years of education: 9  . Highest education level: 9th grade  Occupational History  . Occupation: disability    Employer: UNEMPLOYED  Social Needs  . Financial resource strain: Not very hard  . Food insecurity:    Worry: Sometimes true    Inability: Sometimes true  . Transportation needs:    Medical: No    Non-medical: No  Tobacco Use  . Smoking status: Current Every Day Smoker    Packs/day: 0.10    Years: 15.00    Pack years: 1.50    Types: Cigarettes  . Smokeless tobacco: Never Used  . Tobacco comment: 2 per day  Substance and Sexual Activity  . Alcohol use: No    Alcohol/week: 0.0 standard drinks  . Drug use: Yes    Types: Marijuana    Comment: As of  05/31/2018: last use 3 weeks ago  . Sexual activity: Yes    Birth control/protection: None  Lifestyle  . Physical activity:    Days per week: 3 days    Minutes per session: 30 min  . Stress: Not at all  Relationships  . Social connections:    Talks on phone: More than three times a week    Gets together: Twice a week    Attends religious service: Never    Active member of club or organization: No    Attends meetings of clubs or organizations: Never    Relationship status: Living with partner  . Intimate partner violence:    Fear of current or ex partner: No    Emotionally abused: No    Physically abused: No    Forced sexual activity: No  Other Topics Concern  . Not on file  Social History Narrative  . Not on file    Review of Systems: Pertinent positive and negative review of systems were noted in the above HPI section.  All other review of systems was otherwise negative.  Physical Exam: Vital signs in last 24 hours: Temp:  [98.4 F (36.9 C)-98.9 F (37.2  C)] 98.5 F (36.9 C) (05/13 0505) Pulse Rate:  [45-70] 45 (05/13 0505) Resp:  [16-17] 17 (05/13 0505) BP: (131-169)/(59-101) 145/75 (05/13 0505) SpO2:  [100 %] 100 % (05/13 0505) Weight:  [66.2 kg] 66.2 kg (05/12 1431) Last BM Date: 05/28/18 General:   Alert,  Well-developed, well-nourished, African-American female pleasant and cooperative in NAD Head:  Normocephalic and atraumatic. Eyes:  Sclera clear, no icterus.   Conjunctiva pink. Ears:  Normal auditory acuity. Nose:  No deformity, discharge,  or lesions. Mouth:  No deformity or lesions.   Neck:  Supple; no masses or thyromegaly. Lungs:  Clear throughout to auscultation.   No wheezes, crackles, or rhonchi.  Heart:  Regular rate and rhythm; no murmurs, clicks, rubs,  or gallops. Abdomen:  Soft, minimally tender epigastrium, BS active,nonpalp mass or hsm no succussion splash, incisional ports from prior cholecystectomy.   Rectal:  Deferred  Msk:  Symmetrical without gross deformities. . Pulses:  Normal pulses noted. Extremities:  Without clubbing or edema. Neurologic:  Alert and  oriented x4;  grossly normal neurologically. Skin:  Intact without significant lesions or rashes.. Psych:  Alert and cooperative. Normal mood and affect.  Intake/Output from previous day: 05/12 0701 - 05/13 0700 In: 903.9 [P.O.:40; I.V.:613.9; IV Piggyback:250] Out: -  Intake/Output this shift: No intake/output data recorded.  Lab Results: Recent Labs    05/31/18 1438 06/01/18 0309  WBC 12.2* 8.0  HGB 11.1* 10.2*  HCT 34.3* 31.4*  PLT 150 134*   BMET Recent Labs    05/31/18 1438 06/01/18 0309  NA 138 140  K 3.1* 3.4*  CL 103 109  CO2 24 24  GLUCOSE 98 88  BUN 12 12  CREATININE 1.02* 0.99  CALCIUM 9.4 8.6*   LFT Recent Labs    05/31/18 1438  PROT 7.2  ALBUMIN 4.5  AST 43*  ALT 45*  ALKPHOS 72  BILITOT 1.0   PT/INR Recent Labs    06/01/18 0309  LABPROT 15.3*  INR 1.2   Hepatitis Panel No results for input(s):  HEPBSAG, HCVAB, HEPAIGM, HEPBIGM in the last 72 hours.   IMPRESSION:  #14 38 year old African-American female with history of previously diagnosed pyloric stenosis who has required periodic dilations and/or Botox injections over the past 5 to 6 years.  Patient most recently had  EGD with balloon dilation in February 2020, then developed recurrence of her abdominal pain nausea and vomiting with inability to keep down any p.o.'s 5 days ago.  Symptoms are very consistent with outlet obstruction secondary to current pyloric stenosis  #2 ongoing NSAID use #3 acute kidney injury resolved #4 hypokalemia secondary to above-correcting #5 GERD #6 status post cholecystectomy #7  History of asthma and COPD #8  Depression #9 constipation/chronic  PLAN: #1 keep n.p.o. #2 DC IV Pepcid and switch to IV Protonix 40 mg every 12 hours #3 continue IV Reglan #4 Have scheduled patient for upper endoscopy and probable balloon dilation of the pylorus with Dr. Rush Landmark this afternoon at 2:30.  Procedure was discussed in detail with the patient including indications risks and benefits and she is agreeable to proceed. #5  Consider UGI  tomorrow depending on findings of EGD today  If  indeed she has evidence of recurrent stenosis on EGD today, she will benefit from surgical consultation to discuss definitive management with pyloroplasty.   Amy Esterwood  06/01/2018, 11:40 AM

## 2018-06-02 DIAGNOSIS — R109 Unspecified abdominal pain: Secondary | ICD-10-CM

## 2018-06-02 DIAGNOSIS — M62838 Other muscle spasm: Secondary | ICD-10-CM

## 2018-06-02 LAB — BASIC METABOLIC PANEL
Anion gap: 9 (ref 5–15)
BUN: 10 mg/dL (ref 6–20)
CO2: 24 mmol/L (ref 22–32)
Calcium: 8.9 mg/dL (ref 8.9–10.3)
Chloride: 105 mmol/L (ref 98–111)
Creatinine, Ser: 0.84 mg/dL (ref 0.44–1.00)
GFR calc Af Amer: >60 mL/min (ref >60)
GFR calc non Af Amer: >60 mL/min (ref >60)
Glucose, Bld: 110 mg/dL — ABNORMAL HIGH (ref 70–99)
Potassium: 3.2 mmol/L — ABNORMAL LOW (ref 3.5–5.1)
Sodium: 138 mmol/L (ref 135–145)

## 2018-06-02 LAB — MAGNESIUM: Magnesium: 2.1 mg/dL (ref 1.7–2.4)

## 2018-06-02 LAB — HEMOGLOBIN A1C
Hgb A1c MFr Bld: 5 % (ref 4.8–5.6)
Mean Plasma Glucose: 96.8 mg/dL

## 2018-06-02 MED ORDER — LINACLOTIDE 145 MCG PO CAPS
145.0000 ug | ORAL_CAPSULE | Freq: Every day | ORAL | Status: DC
  Administered 2018-06-03: 09:00:00 145 ug via ORAL
  Filled 2018-06-02 (×2): qty 1

## 2018-06-02 MED ORDER — GABAPENTIN 100 MG PO CAPS
200.0000 mg | ORAL_CAPSULE | Freq: Three times a day (TID) | ORAL | Status: DC
  Administered 2018-06-02 – 2018-06-03 (×3): 200 mg via ORAL
  Filled 2018-06-02 (×3): qty 2

## 2018-06-02 MED ORDER — POTASSIUM CHLORIDE CRYS ER 20 MEQ PO TBCR
40.0000 meq | EXTENDED_RELEASE_TABLET | Freq: Once | ORAL | Status: AC
  Administered 2018-06-02: 15:00:00 40 meq via ORAL
  Filled 2018-06-02: qty 2

## 2018-06-02 NOTE — Progress Notes (Signed)
PROGRESS NOTE    Tracey Daniel  WJX:[id number] DOB: 10/21/1980 DOA: 05/31/2018 PCP: [name], MD   Brief Narrative: Tracey Daniel is a 38 y.o. female with medical history significant of gastric outlet obstruction status post previous Botox injection, gastritis, gastroparesis, peptic ulcer disease, pyloric stenosis, pyloric spasm, hyperlipidemia, COPD, asthma, GERD, depression tobacco abuse, marijuana abuse. Patient presented secondary to worsening nausea/vomiting, abdominal pain.   Assessment & Plan:   Principal Problem:   Abdominal pain Active Problems:   Depression   GERD (gastroesophageal reflux disease)   COPD (chronic obstructive pulmonary disease) (HCC)   Intractable vomiting   Hypokalemia   Gastric outlet obstruction   Anemia   Hyperlipemia   Leukocytosis   Tobacco abuse   Nausea/vomiting Abdominal pain History of gastric outlet obstruction, s/p EGD with balloon dilation of pyloric channel in February. Also consideration of cannabinoid hyperemesis syndrome. Still with nausea this morning preventing her from taking adequate oral intake. S/p EGD on 5/13 with evidence of erosive gastropathy. Pylorus appeared normal. Multiple biopsies obtained. -Continue IV fluids -GI recommendations: advance diet  Depression -Continue Zoloft  GERD On Protonix as an outpatient.Started on IV Pepcid -Continue Pepcid IV while NPO  COPD -Continue Dulera and albuterol prn  Hypokalemia Down today. In setting of recent vomiting -Potassium supplementation -Add-on magnesium; replete if needed  Anemia Stable. Baseline of 11.  Tobacco abuse -Continue nicotine patch  Leukocytosis Resolved.  Marijuana use Chronic. Possibly contributing to recurrent nausea/vomiting/abdominal pain episodes. Cessation discussed previously.  Schizophrenia Anxiety -Continue Risperdal, propranolol, Zoloft  Migraines -Continue zonisamide  Muscle spasm -Tizanidine prn   DVT  prophylaxis: SCDs Code Status:   Code Status: Full Code Family Communication: None Disposition Plan: Discharge likely in 24-48 hours if patient continues to improve and pending GI recommendations   Consultants:   [facility]  Procedures:   06/01/2018: EGD Impression:               - No gross lesions in esophagus. Biopsied.                           - Small hiatal hernia.                           - Erythematous mucosa in the cardia, gastric fundus                            and gastric body.                           - Erosive gastropathy. Biopsied for HP.                           - Normal pylorus. Dilated. Injected with botulinum                            toxin.                           - No gross lesions in the duodenal bulb, in the                            first portion of the duodenum and in the  second                            portion of the duodenum. Biopsied. Recommendation:           - The patient will be observed post-procedure,                            until all discharge criteria are met.                           - Return patient to hospital ward for ongoing care.                           - Clear liquid diet.                           - Observe patient's clinical course.                           - Await pathology results.                           - IV PPI BID while unable to take orals and then                            transition to PO PPI.                           - Continue current medication regimen.                           - Will consider role of repeat SF-GES.                           - Hemoglobin A1c to be done.                           - Can consider role of surgical consultation but                            with significantly patent pylorus, it is not clear                            how much benefit this would occur but                           - The findings and recommendations were discussed                            with the patient.                            - The findings and recommendations were discussed  with the referring physician.  Antimicrobials:  None    Subjective: Nausea improved today. Had some chest pain after her procedure yesterday which has completely resolved. She has some left back pain that is sharp that started yesterday as well and has remained persistent even with time.  Objective: Vitals:   06/01/18 1647 06/01/18 1655 06/01/18 1942 06/02/18 0502  BP: (!) 167/135 (!) 194/114 117/77 122/82  Pulse: 78 75 (!) 54 (!) 46  Resp: _0 Temp:   98.6 F (37 C) 98.7 F (37.1 C)  TempSrc:   Oral Oral  SpO2: 100% 100% 100% 100%  Weight:      Height:        Intake/Output Summary (Last 24 hours) at 06/02/2018 1022 Last data filed at 06/02/2018 0929 Gross per 24 hour  Intake 1100 ml  Output 600 ml  Net 500 ml   Filed Weights   05/31/18 1431  Weight: 66.2 kg    Examination:  General exam: Appears calm and comfortable Respiratory system: Clear to auscultation. Respiratory effort normal. Cardiovascular system: S1 & S2 heard, RRR. No murmurs, rubs, gallops or clicks. Gastrointestinal system: Abdomen is nondistended, soft and nontender. No organomegaly or masses felt. Normal bowel sounds heard. Central nervous system: Alert and oriented. No focal neurological deficits. Musculoskeletal: No edema. No calf tenderness. Left inferior trapezius muscle feels tight and is tender Skin: No cyanosis. No rashes Psychiatry: Judgement and insight appear normal. Mood & affect appropriate.     Data Reviewed: I have personally reviewed following labs and imaging studies  CBC: Recent Labs  Lab 05/28/18 1750 05/29/18 0824 05/31/18 1438 06/01/18 0309  WBC 14.5* 19.3* 12.2* 8.0  HGB 12.4 11.5* 11.1* 10.2*  HCT 37.7 35.0* 34.3* 31.4*  MCV 93.5 93.3 94.8 93.7  PLT 194 170 150 697*   Basic Metabolic Panel: Recent Labs  Lab 05/28/18 1750 05/29/18 0824 05/31/18 1438  05/31/18 1730 06/01/18 0309 06/02/18 0233  NA 138 137 138  --  140 138  K 3.6 3.0* 3.1*  --  3.4* 3.2*  CL 104 102 103  --  109 105  CO2 20* 20* 24  --  24 24  GLUCOSE 130* 138* 98  --  88 110*  BUN _1 --  12 10  CREATININE 0.89 0.89 1.02*  --  0.99 0.84  CALCIUM 10.1 9.8 9.4  --  8.6* 8.9  MG  --   --   --  2.2  --   --    GFR: Estimated Creatinine Clearance: 83.3 mL/min (by C-G formula based on SCr of 0.84 mg/dL). Liver Function Tests: Recent Labs  Lab 05/28/18 1750 05/29/18 0824 05/31/18 1438  AST 22 28 43*  ALT 17 21 45*  ALKPHOS 77 75 72  BILITOT 0.7 0.6 1.0  PROT 7.9 7.8 7.2  ALBUMIN 5.0 4.8 4.5   Recent Labs  Lab 05/28/18 1750 05/29/18 0824 05/31/18 1438  LIPASE 25 36 24   No results for input(s): AMMONIA in the last 168 hours. Coagulation Profile: Recent Labs  Lab 06/01/18 0309  INR 1.2   Cardiac Enzymes: No results for input(s): CKTOTAL, CKMB, CKMBINDEX, TROPONINI in the last 168 hours. BNP (last 3 results) No results for input(s): PROBNP in the last 8760 hours. HbA1C: Recent Labs    06/02/18 0233  HGBA1C 5.0   CBG: No results for input(s): GLUCAP in the last 168 hours. Lipid Profile: No results for input(s): CHOL, HDL, LDLCALC, TRIG, CHOLHDL, LDLDIRECT in  the last 72 hours. Thyroid Function Tests: No results for input(s): TSH, T4TOTAL, FREET4, T3FREE, THYROIDAB in the last 72 hours. Anemia Panel: No results for input(s): VITAMINB12, FOLATE, FERRITIN, TIBC, IRON, RETICCTPCT in the last 72 hours. Sepsis Labs: Recent Labs  Lab 05/28/18 1750  LATICACIDVEN 2.3*    Recent Results (from the past 240 hour(s))  SARS Coronavirus 2 (CEPHEID - Performed in [facility] hospital lab), Hosp Order     Status: None   Collection Time: 05/31/18  7:47 PM  Result Value Ref Range Status   SARS Coronavirus 2 NEGATIVE NEGATIVE Final    Comment: (NOTE) If result is NEGATIVE SARS-CoV-2 target nucleic acids are NOT DETECTED. The SARS-CoV-2 RNA is  generally detectable in upper and lower  respiratory specimens during the acute phase of infection. The lowest  concentration of SARS-CoV-2 viral copies this assay can detect is 250  copies / mL. A negative result does not preclude SARS-CoV-2 infection  and should not be used as the sole basis for treatment or other  patient management decisions.  A negative result may occur with  improper specimen collection / handling, submission of specimen other  than nasopharyngeal swab, presence of viral mutation(s) within the  areas targeted by this assay, and inadequate number of viral copies  (<250 copies / mL). A negative result must be combined with clinical  observations, patient history, and epidemiological information. If result is POSITIVE SARS-CoV-2 target nucleic acids are DETECTED. The SARS-CoV-2 RNA is generally detectable in upper and lower  respiratory specimens dur ing the acute phase of infection.  Positive  results are indicative of active infection with SARS-CoV-2.  Clinical  correlation with patient history and other diagnostic information is  necessary to determine patient infection status.  Positive results do  not rule out bacterial infection or co-infection with other viruses. If result is PRESUMPTIVE POSTIVE SARS-CoV-2 nucleic acids MAY BE PRESENT.   A presumptive positive result was obtained on the submitted specimen  and confirmed on repeat testing.  While 2019 novel coronavirus  (SARS-CoV-2) nucleic acids may be present in the submitted sample  additional confirmatory testing may be necessary for epidemiological  and / or clinical management purposes  to differentiate between  SARS-CoV-2 and other Sarbecovirus currently known to infect humans.  If clinically indicated additional testing with an alternate test  methodology [phone]) is advised. The SARS-CoV-2 RNA is generally  detectable in upper and lower respiratory sp ecimens during the acute  phase of infection.  The expected result is Negative. Fact Sheet for Patients:  StrictlyIdeas.no Fact Sheet for Healthcare Providers: BankingDealers.co.za This test is not yet approved or cleared by the [place] FDA and has been authorized for detection and/or diagnosis of SARS-CoV-2 by FDA under an Emergency Use Authorization (EUA).  This EUA will remain in effect (meaning this test can be used) for the duration of the COVID-19 declaration under Section 564(b)(1) of the Act, 21 U.S.C. section 360bbb-3(b)(1), unless the authorization is terminated or revoked sooner. Performed at [facility], [place] [address] [place], [place] [address]          Radiology Studies: Dg Abd 1 View - Kub  Result Date: 06/01/2018 CLINICAL DATA:  Post EGD with pyloric dilatation. Abdominal pain. Rule out perforation. EXAM: ABDOMEN - 1 VIEW COMPARISON:  None. FINDINGS: The bowel gas pattern is normal. No radio-opaque calculi or other significant radiographic abnormality are seen. No free air. IMPRESSION: Negative. Electronically Signed   By: Franchot Gallo M.D.  On: 06/01/2018 16:55   Dg Chest Port 1 View  Result Date: 06/01/2018 CLINICAL DATA:  Post EGD with pyloric dilatation. Abdominal pain. Rule out free air. EXAM: PORTABLE CHEST 1 VIEW COMPARISON:  05/28/2018 FINDINGS: The heart size and mediastinal contours are within normal limits. Both lungs are clear. The visualized skeletal structures are unremarkable. No free air. IMPRESSION: No active disease. Electronically Signed   By: Franchot Gallo M.D.   On: 06/01/2018 16:56        Scheduled Meds: . azelastine  2 spray Each Nare BID  . calcium-vitamin D  1 tablet Oral BID  . chlorhexidine  5 mL Mouth Rinse q morning - 10a  . fluticasone  2 spray Each Nare Daily  . gabapentin  200 mg Oral TID  . linaclotide  290 mcg Oral QAC breakfast  . loratadine  10 mg Oral Daily  . metoCLOPramide (REGLAN) injection  5 mg  Intravenous Q8H  . mometasone-formoterol  2 puff Inhalation BID  . nicotine  21 mg Transdermal Daily  . pantoprazole (PROTONIX) IV  40 mg Intravenous Q12H  . polyethylene glycol  17 g Oral BID  . pravastatin  20 mg Oral Daily  . propranolol  10 mg Oral TID  . risperiDONE  0.5 mg Oral QHS  . senna-docusate  2 tablet Oral QHS  . sertraline  50 mg Oral QHS  . zonisamide  150 mg Oral QHS   Continuous Infusions: . sodium chloride 125 mL/hr at 06/01/18 1735     LOS: 1 day     Cordelia Poche, MD [facility] 06/02/2018, 10:22 AM  If 7PM-7AM, please contact night-coverage [email or web address]

## 2018-06-02 NOTE — Progress Notes (Signed)
Patient ID: Tracey Daniel, female   DOB: 05-Mar-1980, 38 y.o.   MRN: [id number]    Progress Note   Subjective  Patient says she feels much better today, she is not having any abdominal pain and no nausea.  She is taking liquids and is hungry. She was on the commode having diarrhea when I first came to see her.  She says she always has diarrhea with Linzess but prefers this over concern with constipation.  She is on 290 mcg at home. Discussed her ongoing marijuana use, she says she had been a daily user in the past but had been trying to cut back recently  EGD yesterday-revealed no evidence of gastric outlet obstruction no significant gastric retention no definite pyloric stenosis.  Empiric balloon dilation and Botox injections were done given her prior history.  Abdominal films and chest x-ray 06/01/2018 postprocedure both negative   Objective   Vital signs in last 24 hours: Temp:  [98.6 F (37 C)-98.9 F (37.2 C)] 98.7 F (37.1 C) (05/14 0502) Pulse Rate:  [46-91] 46 (05/14 0502) Resp:  [13-19] 16 (05/14 0502) BP: (117-209)/(77-135) 122/82 (05/14 0502) SpO2:  [100 %] 100 % (05/14 0502) Last BM Date: 06/01/18 General:   African-American female in NAD Heart:  Regular rate and rhythm; no murmurs Lungs: Respirations even and unlabored, lungs CTA bilaterally Abdomen:  Soft, nontender and nondistended. Normal bowel sounds. Extremities:  Without edema. Neurologic:  Alert and oriented,  grossly normal neurologically. Psych:  Cooperative. Normal mood and affect.  Intake/Output from previous day: 05/13 0701 - 05/14 0700 In: 740 [P.O.:240; I.V.:500] Out: 600 [Urine:600] Intake/Output this shift: No intake/output data recorded.  Lab Results: Recent Labs    05/31/18 1438 06/01/18 0309  WBC 12.2* 8.0  HGB 11.1* 10.2*  HCT 34.3* 31.4*  PLT 150 134*   BMET Recent Labs    05/31/18 1438 06/01/18 0309 06/02/18 0233  NA 138 140 138  K 3.1* 3.4* 3.2*  CL 103 109 105  CO2 24 24  24   GLUCOSE 98 88 110*  BUN 12 12 10   CREATININE 1.02* 0.99 0.84  CALCIUM 9.4 8.6* 8.9   LFT Recent Labs    05/31/18 1438  PROT 7.2  ALBUMIN 4.5  AST 43*  ALT 45*  ALKPHOS 72  BILITOT 1.0   PT/INR Recent Labs    06/01/18 0309  LABPROT 15.3*  INR 1.2    Studies/Results: Dg Abd 1 View - Kub  Result Date: 06/01/2018 CLINICAL DATA:  Post EGD with pyloric dilatation. Abdominal pain. Rule out perforation. EXAM: ABDOMEN - 1 VIEW COMPARISON:  None. FINDINGS: The bowel gas pattern is normal. No radio-opaque calculi or other significant radiographic abnormality are seen. No free air. IMPRESSION: Negative. Electronically Signed   By: Franchot Gallo M.D.   On: 06/01/2018 16:55   Dg Chest Port 1 View  Result Date: 06/01/2018 CLINICAL DATA:  Post EGD with pyloric dilatation. Abdominal pain. Rule out free air. EXAM: PORTABLE CHEST 1 VIEW COMPARISON:  05/28/2018 FINDINGS: The heart size and mediastinal contours are within normal limits. Both lungs are clear. The visualized skeletal structures are unremarkable. No free air. IMPRESSION: No active disease. Electronically Signed   By: Franchot Gallo M.D.   On: 06/01/2018 16:56       Assessment / Plan:    #87 38 year old African-American female with history of pyloric stenosis who had undergone several prior EGDs balloon dilation and/or Botox.  Patient was admitted 2 days ago with 5-day history of upper  abdominal pain and intractable nausea and vomiting with inability to keep down p.o.'s.  She describes this as similar to her prior episodes.  Upper endoscopy yesterday interestingly did not show any evidence of gastric outlet obstruction or definite pyloric stenosis she was empirically balloon dilated and went Botox injections of the pylorus.  Also noted erosive gastropathy  Abdominal pain post procedure-abdominal films negative patient feels much better today with no significant abdominal discomfort.  Etiology of this recent episode is not  entirely clear.  Suspect she does have chronic gastric dysmotility.  Also consider cannabis hyperemesis type sure precipitating this current episode  #2 status post cholecystectomy #3-chronic constipation-current dose of Linzess 290 mcg daily uses daily diarrhea advised patient to take half dose daily or switch to every other day dosing  Plan; advance diet as tolerated with goal of continuing low residue diet as an outpatient Nutrition consult to discuss low residue diet If she tolerates full liquids today, would convert IV Reglan, IV PPI and antiemetics to oral  I discussed cannabis hyperemesis syndrome with the patient today in detail was strongly advised to discontinue all marijuana use ,she voiced understanding  May need further work-up as an outpatient with gastric emptying scan and/or referral to [facility] to GI motility clinic- This can be arranged through [facility]  GI/Dr. Gala Romney      Principal Problem:   Abdominal pain Active Problems:   Depression   GERD (gastroesophageal reflux disease)   COPD (chronic obstructive pulmonary disease) (HCC)   Intractable vomiting   Hypokalemia   Gastric outlet obstruction   Anemia   Hyperlipemia   Leukocytosis   Tobacco abuse     LOS: 1 day   Amy EsterwoodPA-C  06/02/2018, 9:35 AM

## 2018-06-03 ENCOUNTER — Encounter (HOSPITAL_COMMUNITY): Payer: Self-pay | Admitting: Gastroenterology

## 2018-06-03 ENCOUNTER — Encounter: Payer: Self-pay | Admitting: Gastroenterology

## 2018-06-03 LAB — BASIC METABOLIC PANEL
Anion gap: 7 (ref 5–15)
BUN: 6 mg/dL (ref 6–20)
CO2: 26 mmol/L (ref 22–32)
Calcium: 9.2 mg/dL (ref 8.9–10.3)
Chloride: 106 mmol/L (ref 98–111)
Creatinine, Ser: 1 mg/dL (ref 0.44–1.00)
GFR calc Af Amer: >60 mL/min (ref >60)
GFR calc non Af Amer: >60 mL/min (ref >60)
Glucose, Bld: 106 mg/dL — ABNORMAL HIGH (ref 70–99)
Potassium: 3.6 mmol/L (ref 3.5–5.1)
Sodium: 139 mmol/L (ref 135–145)

## 2018-06-03 MED ORDER — METOCLOPRAMIDE HCL 10 MG PO TABS: 10.0000 mg | ORAL_TABLET | Freq: Three times a day (TID) | ORAL | 0 refills | Status: DC | PRN

## 2018-06-03 MED ORDER — AZELASTINE HCL 0.1 % NA SOLN: NASAL | 5 refills | Status: DC

## 2018-06-03 NOTE — Progress Notes (Signed)
Patient discharged to home. Verbalized understanding of all discharge instructions including nutrition instructions, discharge medications and follow up MD visits

## 2018-06-03 NOTE — Discharge Summary (Signed)
Physician Discharge Summary  Tracey Daniel ZOX:[id number] DOB: 09/24/1980 DOA: 05/31/2018  PCP: [name], MD  Admit date: 05/31/2018 Discharge date: 06/03/2018  Admitted From: Home Disposition: Home  Recommendations for Outpatient Follow-up:  1. Follow up with PCP in 1 week 2. Follow up with GI in 2-3 weeks 3. Please obtain BMP/CBC in one week 4. Please follow up on the following pending results: None  Home Health: None Equipment/Devices: None  Discharge Condition: Stable CODE STATUS: Full code Diet recommendation: Full liquid diet for a few days followed by a soft diet   Brief/Interim Summary:  Admission HPI written by Ivor Costa, MD   Chief Complaint: Nausea, vomiting, abdominal pain  HPI: Tracey Daniel is a 38 y.o. female with medical history significant of gastric outlet obstruction status post previous Botox injection, gastritis, gastroparesis, peptic ulcer disease, pyloric stenosis, pyloric spasm, hyperlipidemia, COPD, asthma, GERD, depression tobacco abuse, marijuana abuse, who presents with nausea vomiting and abdominal pain.  Patient states that she has been having worsening abdominal pain in the past 4 days, associated with nausea and vomiting.  She has vomited more than 10 times with nonbloody and non-biliary vomitus.  No diarrhea or constipation.  Denies fever or chills.  The abdominal pain is located in the central abdomen, constant, 10 out of 10 in severity, sharp, radiating to the back.  Patient denies chest pain, shortness of breath, cough, fever or chills.  No runny nose or sore throat.  She has burning on urination sometimes, Of note, she is also recently admitted to the hospital this past February and she had an EGD showing pyloric stenosis and she had Maloney dilation. [facility] is [facility]. This is her third ER visit in the past 3 days. She had CT abdomen/pelvis on 5/10, which was negative for acute  issues.She has been taking Reglan, Phenergan p.o. and suppositories without much improvement of her symptoms.   ED Course: pt was found to have WBC 12.2, negative urinalysis, pregnancy test beta-hCG 5.1 (weak positive), lipase 24, potassium 3.1, creatinine 1.02, BUN 12, temperature normal, bradycardia, oxygen saturation 100% on room air.  Patient is placed on MedSurg bed for observation.  [facility], Dr. Henrene Pastor was consulted by EDP.   Hospital course:   Nausea/vomiting Abdominal pain History of gastric outlet obstruction, s/p EGD with balloon dilation of pyloric channel in February. Also consideration of cannabinoid hyperemesis syndrome. Still with nausea this morning preventing her from taking adequate oral intake. S/p EGD on 5/13 with evidence of erosive gastropathy. Pylorus appeared normal. Multiple biopsies obtained and pending. Discharge on full liquid diet with prompt follow-up with primary gastroenterologist. Possible gastroparesis per GI assessment. EGD report below.  Depression Continue Zoloft  GERD On Protonix as an outpatient.Started on IV Pepcid and transitioned back to Protonix on discharge.  COPD Continue Dulera and albuterol prn  Hypokalemia Secondary to vomiting. Resolved with repletion. Magnesium normal.  Anemia Stable. Baseline of 11.  Tobacco abuse Nicotine patch.   Leukocytosis Resolved.  Marijuana use Chronic. Possibly contributing to recurrent nausea/vomiting/abdominal pain episodes. Cessation discussed previously.  Schizophrenia Anxiety Continue Risperdal, propranolol, Zoloft  Migraines Continue zonisamide  Muscle spasm Tizanidine prn. Heating pad prn.  Discharge Diagnoses:  Principal Problem:   Abdominal pain Active Problems:   Depression   GERD (gastroesophageal reflux disease)   COPD (chronic obstructive pulmonary disease) (HCC)   Intractable vomiting   Hypokalemia   Gastric outlet obstruction   Anemia   Hyperlipemia  Leukocytosis   Tobacco abuse    Discharge Instructions  Discharge Instructions    Call MD for:  persistant nausea and vomiting   Complete by:  As directed    Call MD for:  severe uncontrolled pain   Complete by:  As directed    Increase activity slowly   Complete by:  As directed      Allergies as of 06/03/2018      Reactions   Benadryl [diphenhydramine] Anaphylaxis   Penicillins Other (See Comments)   Unknown Has patient had a PCN reaction causing immediate rash, facial/tongue/throat swelling, SOB or lightheadedness with hypotension Yes Has patient had a PCN reaction causing severe rash involving mucus membranes or skin necrosis: Yes-hives  Has patient had a PCN reaction that required hospitalization Yes Has patient had a PCN reaction occurring within the last 10 years: Yes If all of the above answers are "NO", then may proceed with Cephalosporin use.   Latex Itching, Other (See Comments)   "burning" where the tape goes      Medication List    STOP taking these medications   diclofenac 75 MG EC tablet Commonly known as:  VOLTAREN     TAKE these medications   acetaminophen 325 MG tablet Commonly known as:  TYLENOL Take 2 tablets (650 mg total) by mouth every 6 (six) hours as needed for mild pain (or Fever >/= 101).   azelastine 0.1 % nasal spray Commonly known as:  ASTELIN USE 2 SPRAYS IN EACH NOSTRIL TWICE DAILY AS DIRECTED   estradiol 1 MG tablet Commonly known as:  ESTRACE Take 1 mg by mouth daily. What changed:  Another medication with the same name was removed. Continue taking this medication, and follow the directions you see here.   feeding supplement Liqd Take 237 mLs by mouth 3 (three) times daily with meals.   fluticasone 50 MCG/ACT nasal spray Commonly known as:  FLONASE SHAKE LIQUID AND USE 2 SPRAYS IN EACH NOSTRIL DAILY What changed:  See the new instructions.   gabapentin 100 MG capsule Commonly known as:  NEURONTIN TAKE ONE CAPSULE BY MOUTH  THREE TIMES DAILY   Linzess 290 MCG Caps capsule Generic drug:  linaclotide TAKE 1 CAPSULE(290 MCG) BY MOUTH DAILY What changed:  See the new instructions.   metoCLOPramide 10 MG tablet Commonly known as:  REGLAN Take 1 tablet (10 mg total) by mouth every 8 (eight) hours as needed for nausea. What changed:    when to take this  reasons to take this   pantoprazole 40 MG tablet Commonly known as:  PROTONIX Take 1 tablet (40 mg total) by mouth daily. What changed:  when to take this   potassium chloride SA 20 MEQ tablet Commonly known as:  K-DUR One tablet once daily by mouth What changed:    how much to take  how to take this  when to take this   pravastatin 20 MG tablet Commonly known as:  PRAVACHOL Take 1 tablet (20 mg total) by mouth daily.   promethazine 25 MG suppository Commonly known as:  PHENERGAN Place 1 suppository (25 mg total) rectally every 6 (six) hours as needed for nausea or vomiting.   propranolol 10 MG tablet Commonly known as:  INDERAL Take 1 tablet (10 mg total) by mouth 3 (three) times daily.   risperiDONE 0.5 MG tablet Commonly known as:  RISPERDAL Take 0.5 mg at bedtime by mouth.   sertraline 50 MG tablet Commonly known as:  ZOLOFT Take 50 mg  by mouth at bedtime.   tiZANidine 4 MG tablet Commonly known as:  ZANAFLEX Take 1 tablet (4 mg total) by mouth 2 (two) times daily as needed (back or spasms).   zonisamide 50 MG capsule Commonly known as:  ZONEGRAN Take 150 mg by mouth at bedtime.      Follow-up Information    [name], MD. Schedule an appointment as soon as possible for a visit in 1 week(s).   Specialty:  Family Medicine Contact information: [address], [place] [address] [phone]        Daneil Dolin, MD. Schedule an appointment as soon as possible for a visit in 2 week(s).   Specialty:  Gastroenterology Contact information: [address] [place]  [address] [phone]          Allergies  Allergen Reactions   Benadryl [Diphenhydramine] Anaphylaxis   Penicillins Other (See Comments)    Unknown Has patient had a PCN reaction causing immediate rash, facial/tongue/throat swelling, SOB or lightheadedness with hypotension Yes Has patient had a PCN reaction causing severe rash involving mucus membranes or skin necrosis: Yes-hives  Has patient had a PCN reaction that required hospitalization Yes Has patient had a PCN reaction occurring within the last 10 years: Yes If all of the above answers are "NO", then may proceed with Cephalosporin use.    Latex Itching and Other (See Comments)    "burning" where the tape goes    Consultations:  [facility]   Procedures/Studies: Dg Abd 1 View - Kub  Result Date: 06/01/2018 CLINICAL DATA:  Post EGD with pyloric dilatation. Abdominal pain. Rule out perforation. EXAM: ABDOMEN - 1 VIEW COMPARISON:  None. FINDINGS: The bowel gas pattern is normal. No radio-opaque calculi or other significant radiographic abnormality are seen. No free air. IMPRESSION: Negative. Electronically Signed   By: Franchot Gallo M.D.   On: 06/01/2018 16:55   Ct Abdomen Pelvis W Contrast  Result Date: 05/29/2018 CLINICAL DATA:  Abdominal pain. Generalized abdominal pain. Upper LEFT side abdominal pain. Vomiting. EXAM: CT ABDOMEN AND PELVIS WITH CONTRAST TECHNIQUE: Multidetector CT imaging of the abdomen and pelvis was performed using the standard protocol following bolus administration of intravenous contrast. CONTRAST:  119m OMNIPAQUE IOHEXOL 300 MG/ML  SOLN COMPARISON:  CT 03/07/2018 FINDINGS: Lower chest: Lung bases are clear. Hepatobiliary: No focal hepatic lesion. Postcholecystectomy. No biliary dilatation. Pancreas: Pancreas is normal. No ductal dilatation. No pancreatic inflammation. Spleen: Normal spleen Adrenals/urinary tract: Adrenal glands and kidneys are normal. The ureters and bladder normal. Stomach/Bowel:  Stomach, small bowel, appendix, and cecum are normal. The colon and rectosigmoid colon are normal. Vascular/Lymphatic: Abdominal aorta is normal caliber. No periportal or retroperitoneal adenopathy. No pelvic adenopathy. Reproductive: Uterus and necks are normal Other: No free fluid. Musculoskeletal: No aggressive osseous lesion. IMPRESSION: 1. No acute abdominopelvic findings. 2. Postcholecystectomy. Electronically Signed   By: SSuzy BouchardM.D.   On: 05/29/2018 09:38   Dg Chest Port 1 View  Result Date: 06/01/2018 CLINICAL DATA:  Post EGD with pyloric dilatation. Abdominal pain. Rule out free air. EXAM: PORTABLE CHEST 1 VIEW COMPARISON:  05/28/2018 FINDINGS: The heart size and mediastinal contours are within normal limits. Both lungs are clear. The visualized skeletal structures are unremarkable. No free air. IMPRESSION: No active disease. Electronically Signed   By: CFranchot GalloM.D.   On: 06/01/2018 16:56   Dg Abd Acute 2+v W 1v Chest  Result Date: 05/28/2018 CLINICAL DATA:  Abdominal pain EXAM: DG ABDOMEN ACUTE  W/ 1V CHEST COMPARISON:  02/22/2018 FINDINGS: Cardiac shadows within normal limits. The lungs are clear bilaterally. No acute bony abnormality chest is seen. Scattered large and small bowel gas is noted. Changes of prior cholecystectomy are seen. No bony abnormality is noted. No free air is seen. IMPRESSION: No acute abnormality in the chest and abdomen. Electronically Signed   By: Inez Catalina M.D.   On: 05/28/2018 21:51      06/01/2018: EGD Impression: - No gross lesions in esophagus. Biopsied. - Small hiatal hernia. - Erythematous mucosa in the cardia, gastric fundus  and gastric body. - Erosive gastropathy. Biopsied for HP. - Normal pylorus. Dilated. Injected with botulinum  toxin. -  No gross lesions in the duodenal bulb, in the  first portion of the duodenum and in the second  portion of the duodenum. Biopsied. Recommendation: - The patient will be observed post-procedure,  until all discharge criteria are met. - Return patient to hospital ward for ongoing care. - Clear liquid diet. - Observe patient's clinical course. - Await pathology results. - IV PPI BID while unable to take orals and then  transition to PO PPI. - Continue current medication regimen. - Will consider role of repeat SF-GES. - Hemoglobin A1c to be done. - Can consider role of surgical consultation but  with significantly patent pylorus, it is not clear  how much benefit this would occur but - The findings and recommendations were discussed  with the patient. - The findings and recommendations were discussed  with the referring physician.   Subjective: No significant nausea today. No vomiting. Feels good.  Discharge Exam: Vitals:   06/03/18 0845 06/03/18 0947  BP:  130/78  Pulse:  (!) 50  Resp:    Temp:    SpO2: 100%    Vitals:   06/02/18 2042 06/03/18 0504 06/03/18 0845 06/03/18 0947  BP:  118/84  130/78  Pulse:  (!) 51  (!) 50  Resp:  16    Temp:  98.4 F (36.9 C)    TempSrc:  Oral    SpO2: 100%  100%   Weight:      Height:        General: Pt is alert, awake, not in acute distress Cardiovascular: RRR, S1/S2 +, no rubs, no gallops Respiratory: CTA bilaterally, no wheezing, no  rhonchi Abdominal: Soft, NT, ND, bowel sounds + Extremities: no edema, no cyanosis    The results of significant diagnostics from this hospitalization (including imaging, microbiology, ancillary and laboratory) are listed below for reference.     Microbiology: Recent Results (from the past 240 hour(s))  SARS Coronavirus 2 (CEPHEID - Performed in [place] hospital lab), Hosp Order     Status: None   Collection Time: 05/31/18  7:47 PM  Result Value Ref Range Status   SARS Coronavirus 2 NEGATIVE NEGATIVE Final    Comment: (NOTE) If result is NEGATIVE SARS-CoV-2 target nucleic acids are NOT DETECTED. The SARS-CoV-2 RNA is generally detectable in upper and lower  respiratory specimens during the acute phase of infection. The lowest  concentration of SARS-CoV-2 viral copies this assay can detect is 250  copies / mL. A negative result does not preclude SARS-CoV-2 infection  and should not be used as the sole basis for treatment or other  patient management decisions.  A negative result may occur with  improper specimen collection / handling, submission of specimen other  than nasopharyngeal swab, presence of viral mutation(s) within the  areas targeted by this assay, and  inadequate number of viral copies  (<250 copies / mL). A negative result must be combined with clinical  observations, patient history, and epidemiological information. If result is POSITIVE SARS-CoV-2 target nucleic acids are DETECTED. The SARS-CoV-2 RNA is generally detectable in upper and lower  respiratory specimens dur ing the acute phase of infection.  Positive  results are indicative of active infection with SARS-CoV-2.  Clinical  correlation with patient history and other diagnostic information is  necessary to determine patient infection status.  Positive results do  not rule out bacterial infection or co-infection with other viruses. If result is PRESUMPTIVE POSTIVE SARS-CoV-2 nucleic acids MAY BE  PRESENT.   A presumptive positive result was obtained on the submitted specimen  and confirmed on repeat testing.  While 2019 novel coronavirus  (SARS-CoV-2) nucleic acids may be present in the submitted sample  additional confirmatory testing may be necessary for epidemiological  and / or clinical management purposes  to differentiate between  SARS-CoV-2 and other Sarbecovirus currently known to infect humans.  If clinically indicated additional testing with an alternate test  methodology [id number]) is advised. The SARS-CoV-2 RNA is generally  detectable in upper and lower respiratory sp ecimens during the acute  phase of infection. The expected result is Negative. Fact Sheet for Patients:  StrictlyIdeas.no Fact Sheet for Healthcare Providers: BankingDealers.co.za This test is not yet approved or cleared by the [place] FDA and has been authorized for detection and/or diagnosis of SARS-CoV-2 by FDA under an Emergency Use Authorization (EUA).  This EUA will remain in effect (meaning this test can be used) for the duration of the COVID-19 declaration under Section 564(b)(1) of the Act, 21 U.S.C. section 360bbb-3(b)(1), unless the authorization is terminated or revoked sooner. Performed at [facility], [place] [address], [place], [place] [address]      Labs: BNP (last 3 results) No results for input(s): BNP in the last 8760 hours. Basic Metabolic Panel: Recent Labs  Lab 05/29/18 0824 05/31/18 1438 05/31/18 1730 06/01/18 0309 06/02/18 0233 06/03/18 0911  NA 137 138  --  140 138 139  K 3.0* 3.1*  --  3.4* 3.2* 3.6  CL 102 103  --  109 105 106  CO2 20* 24  --  24 24 26   GLUCOSE 138* 98  --  88 110* 106*  BUN 11 12  --  12 10 6   CREATININE 0.89 1.02*  --  0.99 0.84 1.00  CALCIUM 9.8 9.4  --  8.6* 8.9 9.2  MG  --   --  2.2  --  2.1  --    Liver Function Tests: Recent Labs  Lab 05/28/18 1750 05/29/18 0824  05/31/18 1438  AST 22 28 43*  ALT 17 21 45*  ALKPHOS 77 75 72  BILITOT 0.7 0.6 1.0  PROT 7.9 7.8 7.2  ALBUMIN 5.0 4.8 4.5   Recent Labs  Lab 05/28/18 1750 05/29/18 0824 05/31/18 1438  LIPASE 25 36 24   No results for input(s): AMMONIA in the last 168 hours. CBC: Recent Labs  Lab 05/28/18 1750 05/29/18 0824 05/31/18 1438 06/01/18 0309  WBC 14.5* 19.3* 12.2* 8.0  HGB 12.4 11.5* 11.1* 10.2*  HCT 37.7 35.0* 34.3* 31.4*  MCV 93.5 93.3 94.8 93.7  PLT 194 170 150 134*   Cardiac Enzymes: No results for input(s): CKTOTAL, CKMB, CKMBINDEX, TROPONINI in the last 168 hours. BNP: Invalid input(s): POCBNP CBG: No results for input(s): GLUCAP in the last 168 hours. D-Dimer No results for  input(s): DDIMER in the last 72 hours. Hgb A1c Recent Labs    06/02/18 0233  HGBA1C 5.0   Lipid Profile No results for input(s): CHOL, HDL, LDLCALC, TRIG, CHOLHDL, LDLDIRECT in the last 72 hours. Thyroid function studies No results for input(s): TSH, T4TOTAL, T3FREE, THYROIDAB in the last 72 hours.  Invalid input(s): FREET3 Anemia work up No results for input(s): VITAMINB12, FOLATE, FERRITIN, TIBC, IRON, RETICCTPCT in the last 72 hours. Urinalysis    Component Value Date/Time   COLORURINE YELLOW 05/31/2018 1758   APPEARANCEUR HAZY (A) 05/31/2018 1758   LABSPEC 1.015 05/31/2018 1758   PHURINE 7.0 05/31/2018 1758   GLUCOSEU NEGATIVE 05/31/2018 1758   HGBUR NEGATIVE 05/31/2018 1758   HGBUR trace-intact 11/21/2008 1448   BILIRUBINUR NEGATIVE 05/31/2018 1758   BILIRUBINUR neg 07/31/2014 0941   KETONESUR 80 (A) 05/31/2018 1758   PROTEINUR NEGATIVE 05/31/2018 1758   UROBILINOGEN 0.2 11/08/2014 1730   NITRITE NEGATIVE 05/31/2018 1758   LEUKOCYTESUR NEGATIVE 05/31/2018 1758   Sepsis Labs Invalid input(s): PROCALCITONIN,  WBC,  LACTICIDVEN Microbiology Recent Results (from the past 240 hour(s))  SARS Coronavirus 2 (CEPHEID - Performed in [facility] hospital lab), Hosp Order      Status: None   Collection Time: 05/31/18  7:47 PM  Result Value Ref Range Status   SARS Coronavirus 2 NEGATIVE NEGATIVE Final    Comment: (NOTE) If result is NEGATIVE SARS-CoV-2 target nucleic acids are NOT DETECTED. The SARS-CoV-2 RNA is generally detectable in upper and lower  respiratory specimens during the acute phase of infection. The lowest  concentration of SARS-CoV-2 viral copies this assay can detect is 250  copies / mL. A negative result does not preclude SARS-CoV-2 infection  and should not be used as the sole basis for treatment or other  patient management decisions.  A negative result may occur with  improper specimen collection / handling, submission of specimen other  than nasopharyngeal swab, presence of viral mutation(s) within the  areas targeted by this assay, and inadequate number of viral copies  (<250 copies / mL). A negative result must be combined with clinical  observations, patient history, and epidemiological information. If result is POSITIVE SARS-CoV-2 target nucleic acids are DETECTED. The SARS-CoV-2 RNA is generally detectable in upper and lower  respiratory specimens dur ing the acute phase of infection.  Positive  results are indicative of active infection with SARS-CoV-2.  Clinical  correlation with patient history and other diagnostic information is  necessary to determine patient infection status.  Positive results do  not rule out bacterial infection or co-infection with other viruses. If result is PRESUMPTIVE POSTIVE SARS-CoV-2 nucleic acids MAY BE PRESENT.   A presumptive positive result was obtained on the submitted specimen  and confirmed on repeat testing.  While 2019 novel coronavirus  (SARS-CoV-2) nucleic acids may be present in the submitted sample  additional confirmatory testing may be necessary for epidemiological  and / or clinical management purposes  to differentiate between  SARS-CoV-2 and other Sarbecovirus currently known to  infect humans.  If clinically indicated additional testing with an alternate test  methodology [id number]) is advised. The SARS-CoV-2 RNA is generally  detectable in upper and lower respiratory sp ecimens during the acute  phase of infection. The expected result is Negative. Fact Sheet for Patients:  StrictlyIdeas.no Fact Sheet for Healthcare Providers: BankingDealers.co.za This test is not yet approved or cleared by the [place] FDA and has been authorized for detection and/or diagnosis of SARS-CoV-2 by FDA under an  Emergency Use Authorization (EUA).  This EUA will remain in effect (meaning this test can be used) for the duration of the COVID-19 declaration under Section 564(b)(1) of the Act, 21 U.S.C. section 360bbb-3(b)(1), unless the authorization is terminated or revoked sooner. Performed at [facility], [place] [address] [place], [place] [address]     SIGNED:   Cordelia Poche, MD [facility] 06/03/2018, 1:18 PM

## 2018-06-03 NOTE — Plan of Care (Addendum)
Nutrition Education Note  RD working remotely.  RD consulted for nutrition education regarding low fiber diet/ gastroparesis.   Per GI notes, recommendations are to continue full liquid diet for the next 2-3 days and increase to low fiber diet.   Spoke with pt via phone, who reports feeling better and appetite has improved. She consumed 100% of potato soup for breakfast. Pt reports that she was told to eat "soft foods" at discharge, but was under the impression that this was a blenderized diet. PTA pt consumed 2 meals per day (Breakfast: egg sandwich; Lunch: hamburger or sandwich). Beverages are chips and sprite. Pt also consumes 1-2 Ensure supplements daily.    RD provided "Gastroparesis Nutrition Therapy" handout from the Academy of [employer] and Dietetics (these were added to pt's AVS/discharge instructions and pt is aware that this is how she will receive written information). Reviewed patient's dietary recall. Reviewed low fiber foods and high fiber foods and how pt can make modifications to diet to optimize compliance. Also discussed importance of reducing fat and eating small, frequent meals to help promote GI motility.   Teach back method used. Pt verbalizes understanding of information provided.   Expect fair to good compliance.  Body mass index is Body mass index is 23.56 kg/m.Marland Kitchen Pt meets criteria for normal weight rannge based on current BMI.  Current diet order is full liquid, patient is consuming approximately 75% of meals at this time. Labs and medications reviewed. No further nutrition interventions warranted at this time. RD contact information provided. If additional nutrition issues arise, please re-consult RD.  Faruq Rosenberger A. Jimmye Norman, RD, LDN, [place] Registered Dietitian II Certified Diabetes Care and Education Specialist Pager: [phone] After hours Pager: [phone]

## 2018-06-03 NOTE — Consult Note (Addendum)
   [facility] CM Inpatient Consult   06/03/2018  Tyrina Hines People [birth year] [id number]    Patient's chart evaluated for potential needs of [facility] Care Management services with her [facility] plan benefits, has 24% high risk score for unplanned readmissions; and has 2 hospitalizations and 4 ED visits in the past 6 months.    Chart reviewedand MD narrative on 5/14 shows as follows: Ms. Autum Benfer Coyne is a 38 y.o. femalewith medical history significant ofgastric outlet obstruction status post previous Botox injection,gastritis, gastroparesis, peptic ulcer disease, pyloric stenosis, pyloric spasm,hyperlipidemia, COPD, asthma, GERD, depression tobacco abuse, marijuana abuse, who presented from home secondary to worsening nausea/vomiting, abdominal pain, status post EGD on 5/13 with evidence of erosive gastropathy. Pylorus appeared normal. Multiple biopsies obtained.  Called and spoke with patient over the hospital room phone (HIPAA verified) and she has not indicated any barriers with medications, pharmacy and transportation. She states that her fiance and her sister will be able to assist with needs at home when needed. She reports having a GI provider (Dr. Gala Romney and Dr. Gordy Levan) that she follows-up with. Inpatient RD has been following patient on this admission.  Her primary care provider is Dr.Margaret Moshe Cipro with [facility]. She has voiced interest of receiving (patient confirmed address as listed in Epic) discount coupons for Ensure as she consumes 1-2 Ensure supplements daily with discharge diet recommendation for full liquid diet for a few days followed by a soft diet;  and [facility] information (brochure and magnet).  Transition of care CM note reviewed and noted, patient is from home with boyfriend and his mother, has transportation to appointments and can afford her medications, no other further home needs identified. Expected disposition is for discharge to home.   Will follow for  disposition needs.Ifthere are any changes and needs forappropriatecommunity follow-up, please referto [facility] care management. Of note, [facility] Care Management services does not replace or interfere with any services that are arranged bytransition of care CMor social work.  Patient agreed she will benefit from [facility] calls to follow-up her continued recovery.   Referral made for [facility] General calls after discharge.   For questions and additional information, please call:  Abby Tucholski A. Vernis Eid, BSN, RN-BC [facility] Liaison Cell: [phone]

## 2018-06-06 ENCOUNTER — Telehealth: Payer: Self-pay | Admitting: Internal Medicine

## 2018-06-06 ENCOUNTER — Other Ambulatory Visit: Payer: Self-pay

## 2018-06-06 NOTE — Patient Outreach (Signed)
[facility]) Care Management  06/06/2018  Tracey Daniel [birth year] [id number]  EMMI: general discharge red alert Referral date: 06/06/18 Referral reason: scheduled follow up appointment: no Insurance: [employer]  Day # 1  Telephone call to patient regarding EMMI general discharge red alert. HIPAA verified with patient. RNCM introduced herself and explained reason for call. HIPAA verified with patient. Patient states she scheduled her follow up appointments this morning. She states she has a follow up visit in the office with Dr. Moshe Cipro her primary MD on on Wednesday 06/08/18 and a virtual telephone follow up with her gastroenterologist on Wednesday 06/08/18.  Patient states she has transportation to her doctor appointment. Patient reports she has her medications and takes them as prescribed. Patient states she had a little stomach pain over the weekend and was moving a little slow but states overall she is doing ok. Denies any pain currently.  Patient asked if she could eat corn.  RNCM encouraged patient not to eat corn at this time due to corn not digesting well.  Patient asked if she could eat greens and potato salad.  RNCM advised patient to follow her advised diet per her discharge instructions of full liquid for a few days followed by a soft diet.  RNCM advised patient to discuss her return to normal diet with her gastroenterologist. Patient verbalized understanding.  Patient denies any further needs or concerns at this time.  RNCM advised patient to notify MD of any changes in condition prior to scheduled appointment. RNCM provided contact name and number: [phone] or main office number [phone] and 24 hour nurse advise line [phone] by mail  [facility] verified patient aware of 911 services for urgent/ emergent needs. RNCM discussed COVID 19 precautions and symptoms. Advised patient to contact her doctor for minor symptoms. If symptoms more severe call 911.  Patient  verbalized understanding.   PLAN:  RNCM will close patient due to patient being assessed and having no further needs.  RNCm will mail patient [facility] care management brochure/ magnet.   Quinn Plowman RN,BSN,CCM [facility] Telephonic  [phone]

## 2018-06-06 NOTE — Telephone Encounter (Signed)
We can schedule a virtual hospital/procedure follow-up. My impression is that the person who did the procedure is generally the one that communicates the results. Has she tried to call them for the results?

## 2018-06-06 NOTE — Telephone Encounter (Signed)
Pt called to make a HOS FU in 2 weeks. She is aware of DOXY visit on 6/11. She was also asking if her results from her procedure were available. Please advise and call [phone]

## 2018-06-06 NOTE — Telephone Encounter (Signed)
EG pt had her procedure at [facility] hospital. The discharge papers said pt should f/u in 2-3 weeks with GI. Pt was also inquiring about results. Pt said at discharge, she was told that the results will be sent to her GI doctor. Will pt need a Doxy call or in person f/u?

## 2018-06-07 ENCOUNTER — Other Ambulatory Visit (HOSPITAL_COMMUNITY): Payer: Self-pay | Admitting: Advanced Practice Midwife

## 2018-06-07 DIAGNOSIS — M542 Cervicalgia: Secondary | ICD-10-CM | POA: Diagnosis not present

## 2018-06-07 DIAGNOSIS — Z1231 Encounter for screening mammogram for malignant neoplasm of breast: Secondary | ICD-10-CM

## 2018-06-07 DIAGNOSIS — M791 Myalgia, unspecified site: Secondary | ICD-10-CM | POA: Diagnosis not present

## 2018-06-07 DIAGNOSIS — G518 Other disorders of facial nerve: Secondary | ICD-10-CM | POA: Diagnosis not present

## 2018-06-07 DIAGNOSIS — G43719 Chronic migraine without aura, intractable, without status migrainosus: Secondary | ICD-10-CM | POA: Diagnosis not present

## 2018-06-07 NOTE — Telephone Encounter (Signed)
Pt is scheduled for 06/30/18.

## 2018-06-08 ENCOUNTER — Encounter: Payer: Self-pay | Admitting: Family Medicine

## 2018-06-08 ENCOUNTER — Ambulatory Visit (INDEPENDENT_AMBULATORY_CARE_PROVIDER_SITE_OTHER): Payer: Medicare HMO | Admitting: Family Medicine

## 2018-06-08 VITALS — Ht 66.0 in | Wt 143.0 lb

## 2018-06-08 DIAGNOSIS — F12988 Cannabis use, unspecified with other cannabis-induced disorder: Secondary | ICD-10-CM

## 2018-06-08 DIAGNOSIS — D649 Anemia, unspecified: Secondary | ICD-10-CM | POA: Diagnosis not present

## 2018-06-08 DIAGNOSIS — IMO0001 Reserved for inherently not codable concepts without codable children: Secondary | ICD-10-CM

## 2018-06-08 DIAGNOSIS — F17218 Nicotine dependence, cigarettes, with other nicotine-induced disorders: Secondary | ICD-10-CM | POA: Diagnosis not present

## 2018-06-08 DIAGNOSIS — F1290 Cannabis use, unspecified, uncomplicated: Secondary | ICD-10-CM

## 2018-06-08 DIAGNOSIS — K3184 Gastroparesis: Secondary | ICD-10-CM | POA: Diagnosis not present

## 2018-06-08 DIAGNOSIS — Z9189 Other specified personal risk factors, not elsewhere classified: Secondary | ICD-10-CM

## 2018-06-08 NOTE — Patient Instructions (Signed)
    Thank you for completing your visit via telephone. I appreciate the opportunity to provide you with the care for your health and wellness. Today we discussed: Overall health post hospital admission  Glad that you are feeling a little bit better today during your telephone visit.  Please continue to take your medications as prescribed and directed.  Follow-up this Friday with your labs at the hospital we will follow-up with you secondary to any findings that need to be addressed.  Additionally make sure that you follow-up with a gastroenterologist in the next week or 2 so that they can follow-up with you on your visit into the hospital.  Please remain off of any smoking and or use of marijuana.  Please try to refrain from smoking cigarettes if possible.  If you need any patches or any items that we can provide to you to help with your smoking sensation let us know.  Please continue to practice social distancing during this time to keep you, your family and our community safe.  [place] YOUR HANDS WELL AND FREQUENTLY. AVOID TOUCHING YOUR FACE, UNLESS YOUR HANDS ARE FRESHLY WASHED.  GET FRESH AIR DAILY. STAY HYDRATED WITH WATER.   It was a pleasure to see you and I look forward to continuing to work together on your health and well-being. Please do not hesitate to call the office if you need care or have questions about your care.  Have a wonderful day and week. With Gratitude, Cherly Beach, DNP, AGNP-BC

## 2018-06-08 NOTE — Progress Notes (Signed)
Virtual Visit via Telephone Note   This visit type was conducted due to national recommendations for restrictions regarding the COVID-19 Pandemic (e.g. social distancing) in an effort to limit this patient's exposure and mitigate transmission in our community.  Due to her co-morbid illnesses, this patient is at least at moderate risk for complications without adequate follow up.  This format is felt to be most appropriate for this patient at this time.  The patient did not have access to video technology/had technical difficulties with video requiring transitioning to audio format only (telephone).  All issues noted in this document were discussed and addressed.  No physical exam could be performed with this format.   Evaluation Performed:  Follow-up visit TOC Date:  06/08/2018   ID:  Tracey Daniel, DOB 13-May-1980, MRN [id number]  Patient Location: Home Provider Location: Other:  telemedicine  Location of Patient: Home Location of Provider: Telehealth Consent was obtain for visit to be over via telehealth. I verified that I am speaking with the correct person using two identifiers.  PCP:  [name], MD   Chief Complaint:  Follow up post hospital admission  History of Present Illness:    Tracey Daniel is a 38 y.o. female patient of Dr. Griffin Dakin.  She presented to the emergency room on May 9, May 10 and May 12.  On May 12 she was admitted.  She has a significant history of gastric outlet obstruction status post previous Botox injection,gastritis, gastroparesis, peptic ulcer disease, pyloric stenosis, pyloric spasm,hyperlipidemia, COPD, asthma, GERD, depression tobacco abuse, marijuana abuse, who presents with nausea vomiting and abdominal pain.  When she presented on May 12 she had had worsening abdominal pain for 4 days associated with nausea and vomiting.  She reported having vomiting more than 10 times with nonbloody non-biliary of vomit.  There is no diarrhea  or constipation.  She denied fevers and chills.  She did have abdominal pain that was located in the central abdomen that she reported to be a 10 out of 10 consistent in severity and sharp and radiating to the back.  During the emergency room and hospital stay she denied having chest pain, shortness of breath, cough, fever or chills.  She did report having some burning on urination sometimes.  Of note she was recently admitted to the hospital this past February and she had had an EGD showing pyloric stenosis and she had a Maloney dilation.  [facility].  She had had a CT abdomen pelvis on May 10 that was negative for acute issues.  She has been taking Reglan, Phenergan p.o. and suppositories without much improvement of her symptoms.  ED lab work found her to have a WBC of 12.2, negative urinalysis, lipase of 24, potassium of 3.1, creatinine of 1.02, BUN of 12, normal temperature, bradycardia, oxygen was normal.  She was placed on the MedSurg floor for observation and lower GI was consulted.  Throughout the course of the hospital stay: History of gastric outlet obstruction, s/p EGD with balloon dilation of pyloric channel in February. Also consideration of cannabinoid hyperemesis syndrome. Still with nausea this morning preventing her from taking adequate oral intake.S/p EGD on 5/13 with evidence of erosive gastropathy. Pylorus appeared normal. Multiple biopsies obtained and pending. Discharge on full liquid diet with prompt follow-up with primary gastroenterologist. Possible gastroparesis per GI assessment.  Was discharged on Protonix.  Reports that she is still taking this.  Reports that she  is still taking her antinausea medication as well.  Every time she takes her medicine.  To prevent nausea.  She denies smoking marijuana at this time.  Her anemia was back to 11 and stable.  Suggestion for recheck of labs within 1 week of discharge.  Additionally following  up with PCP she is to follow-up with gastroenterology in the next 2 to 3 weeks.  Overall she feels she is improved some slightly.  But is still not feeling the best.  Will be following up with GI.  Reports she is taking all her medications previously as ordered and directed.  Denies having any vision changes, dizziness, headaches, chest pain, chest tightness, leg swelling or any other signs or symptoms of infection.  The patient does not have symptoms concerning for COVID-19 infection (fever, chills, cough, or new shortness of breath).    Past Medical, Surgical, Social History, Allergies, and Medications have been Reviewed.   Past Medical History:  Diagnosis Date   Anemia of other chronic disease 11/29/2012   Asthma    Back pain, chronic    Chronic abdominal pain    Constipation    COPD (chronic obstructive pulmonary disease) (HCC)    Cyst of skin    mid spine area   Depression 2000   h/o suicidal ideation   Gastric outlet obstruction    Gastritis    Gastroparesis    Migraine    Migraines    Nausea and vomiting    recurrent   Nicotine addiction    Osteoporosis    Peptic ulcer disease    Pyloric spasm 03/30/2011   Seasonal allergies    Sinusitis    Substance abuse ([place]) 2008   marijuana   Vitamin D deficiency    Past Surgical History:  Procedure Laterality Date   BALLOON DILATION N/A 02/09/2013   Procedure: BALLOON DILATION;  Surgeon: Missy Sabins, MD;  Location: [facility];  Service: Endoscopy;  Laterality: N/A;   BALLOON DILATION N/A 03/10/2018   Procedure: BALLOON DILATION;  Surgeon: Daneil Dolin, MD;  [facility];  Service: Endoscopy;  Laterality: N/A;   BILIARY DILATION  06/01/2018   Procedure: PYLORIC DILATION;  Surgeon: Rush Landmark Telford Nab., MD;  Location: [facility];  Service: Gastroenterology;;   BIOPSY  06/01/2018   Procedure: BIOPSY;  Surgeon: Irving Copas., MD;  Location: [facility];  Service:  Gastroenterology;;   BOTOX INJECTION N/A 02/09/2013   Procedure: BOTOX INJECTION;  Surgeon: Missy Sabins, MD;  Location: [facility];  Service: Endoscopy;  Laterality: N/A;  possible balloon   BOTOX INJECTION N/A 10/24/2015   Procedure: BOTOX INJECTION;  Surgeon: Daneil Dolin, MD;  [facility];  Service: Endoscopy;  Laterality: N/A;   BOTOX INJECTION N/A 03/10/2018   Procedure: BOTOX INJECTION;  Surgeon: Daneil Dolin, MD;  [facility];  Service: Endoscopy;  Laterality: N/A;  Melanie notified   BOTOX INJECTION  06/01/2018   Procedure: BOTOX INJECTION;  Surgeon: Rush Landmark Telford Nab., MD;  Location: [facility];  Service: Gastroenterology;;   CARPAL TUNNEL RELEASE     left hand   CHOLECYSTECTOMY     ?2002   COLONOSCOPY WITH PROPOFOL N/A 09/20/2014   RMR: Normal ileo-colonoscopy   ESOPHAGEAL DILATION  12/25/2010   Procedure: ESOPHAGEAL DILATION;  Surgeon: Dorothyann Peng, MD;  [facility];  Service: Endoscopy;;   ESOPHAGOGASTRODUODENOSCOPY  12/25/2010   Dorothyann Peng, MD;  moderate gastritis, ?goo secondary to pylorspasm. BX showed reactive  gstropathy no h.pyori, SB mucosa with intramucosal lymphocytosis and partial villous blunting (TTG 4.0 normal)   ESOPHAGOGASTRODUODENOSCOPY N/A 11/14/2012   DJS:HFWYO hiatal hernia. Abnormal gastric mucosa of  uncertain significance-status post biopsy. Subjectively, patient may have recurrent symptomatic, pylorospasm.   ESOPHAGOGASTRODUODENOSCOPY (EGD) WITH PROPOFOL N/A 02/09/2013   elongated stomach, partial lower esophageal ring widely patent. No obvious pyloric stenosis s/p Botox   ESOPHAGOGASTRODUODENOSCOPY (EGD) WITH PROPOFOL N/A 10/24/2015   Procedure: ESOPHAGOGASTRODUODENOSCOPY (EGD) WITH PROPOFOL;  Surgeon: Daneil Dolin, MD;  [facility];  Service: Endoscopy;  Laterality: N/A;  830    ESOPHAGOGASTRODUODENOSCOPY (EGD) WITH PROPOFOL N/A 03/10/2018   Dr. Gala Romney: normal esophagus, elongated,  dilated stomach likely stigmata of slow gastric emptying due to narrowing of pyloric channel, s/p balloon dilatation of pyloric channel. Small hiatal hernia, normal duodenal bulb and second portion of duodenum   ESOPHAGOGASTRODUODENOSCOPY (EGD) WITH PROPOFOL N/A 06/01/2018   Procedure: ESOPHAGOGASTRODUODENOSCOPY (EGD) WITH PROPOFOL;  Surgeon: Rush Landmark Telford Nab., MD;  Location: [place];  Service: Gastroenterology;  Laterality: N/A;   laser surgery on cervix     NASAL SEPTUM SURGERY  01/29/2017   TOE SURGERY       Current Meds  Medication Sig   acetaminophen (TYLENOL) 325 MG tablet Take 2 tablets (650 mg total) by mouth every 6 (six) hours as needed for mild pain (or Fever >/= 101).   azelastine (ASTELIN) 0.1 % nasal spray USE 2 SPRAYS IN EACH NOSTRIL TWICE DAILY AS DIRECTED   estradiol (ESTRACE) 1 MG tablet Take 1 mg by mouth daily.   feeding supplement (ENSURE CLINICAL STRENGTH) LIQD Take 237 mLs by mouth 3 (three) times daily with meals.   fluticasone (FLONASE) 50 MCG/ACT nasal spray SHAKE LIQUID AND USE 2 SPRAYS IN EACH NOSTRIL DAILY (Patient taking differently: Place 2 sprays into both nostrils daily. )   gabapentin (NEURONTIN) 100 MG capsule TAKE ONE CAPSULE BY MOUTH THREE TIMES DAILY (Patient taking differently: Take 100 mg by mouth 3 (three) times daily. )   LINZESS 290 MCG CAPS capsule TAKE 1 CAPSULE(290 MCG) BY MOUTH DAILY (Patient taking differently: Take 290 mcg by mouth daily. )   metoCLOPramide (REGLAN) 10 MG tablet Take 1 tablet (10 mg total) by mouth every 8 (eight) hours as needed for nausea.   pantoprazole (PROTONIX) 40 MG tablet Take 1 tablet (40 mg total) by mouth daily. (Patient taking differently: Take 40 mg by mouth 2 (two) times daily. )   potassium chloride SA (K-DUR,KLOR-CON) 20 MEQ tablet One tablet once daily by mouth (Patient taking differently: Take 20 mEq by mouth daily. One tablet once daily by mouth)   pravastatin (PRAVACHOL) 20 MG tablet  Take 1 tablet (20 mg total) by mouth daily.   promethazine (PHENERGAN) 25 MG suppository Place 1 suppository (25 mg total) rectally every 6 (six) hours as needed for nausea or vomiting.   propranolol (INDERAL) 10 MG tablet Take 1 tablet (10 mg total) by mouth 3 (three) times daily.   risperiDONE (RISPERDAL) 0.5 MG tablet Take 0.5 mg at bedtime by mouth.   sertraline (ZOLOFT) 50 MG tablet Take 50 mg by mouth at bedtime.    tiZANidine (ZANAFLEX) 4 MG tablet Take 1 tablet (4 mg total) by mouth 2 (two) times daily as needed (back or spasms).   zonisamide (ZONEGRAN) 50 MG capsule Take 150 mg by mouth at bedtime.      Allergies:   Benadryl [diphenhydramine]; Penicillins; and Latex   Social History   Tobacco Use   Smoking  status: Current Every Day Smoker    Packs/day: 0.10    Years: 15.00    Pack years: 1.50    Types: Cigarettes   Smokeless tobacco: Never Used   Tobacco comment: 2 per day  Substance Use Topics   Alcohol use: No    Alcohol/week: 0.0 standard drinks   Drug use: Yes    Types: Marijuana    Comment: As of 05/31/2018: last use 3 weeks ago     Family Hx: The patient's family history includes Alcohol abuse in her sister; Bipolar disorder in her sister; Depression (age of onset: 52) in her sister; Diabetes in her father; Heart disease in her maternal grandmother; Liver disease in her father; Lung cancer (age of onset: 4) in her mother; Multiple sclerosis (age of onset: 68) in her sister; Parkinson's disease in her maternal grandfather; Schizophrenia in her sister. There is no history of Colon cancer.  ROS:   Please see the history of present illness.    Review of Systems  Constitutional: Negative.   HENT: Negative.   Eyes: Negative.   Respiratory: Negative.   Cardiovascular: Negative.   Genitourinary: Negative.   Musculoskeletal: Negative.   Skin: Negative.   Neurological: Negative.   Endo/Heme/Allergies: Negative.   Psychiatric/Behavioral: Negative.   All  other systems reviewed and are negative.  Labs/Other Tests and Data Reviewed:    Recent Labs: 07/30/2017: TSH 0.54 05/31/2018: ALT 45 06/01/2018: Hemoglobin 10.2; Platelets 134 06/02/2018: Magnesium 2.1 06/03/2018: BUN 6; Creatinine, Ser 1.00; Potassium 3.6; Sodium 139   Recent Lipid Panel Lab Results  Component Value Date/Time   CHOL 166 07/30/2017 07:47 AM   TRIG 106 07/30/2017 07:47 AM   HDL 41 (L) 07/30/2017 07:47 AM   CHOLHDL 4.0 07/30/2017 07:47 AM   LDLCALC 105 (H) 07/30/2017 07:47 AM    Wt Readings from Last 3 Encounters:  06/08/18 143 lb (64.9 kg)  05/31/18 145 lb 15.1 oz (66.2 kg)  05/29/18 146 lb (66.2 kg)     Objective:    Vital Signs:  Ht 5\' 6"  (1.676 m)    Wt 143 lb (64.9 kg)    BMI 23.08 kg/m    VITAL SIGNS:  reviewed GEN:  Alert and oriented RESPIRATORY:  No signs of shortness of breath in conversation PSYCH:  Normal affect, mood, good communication  ASSESSMENT & PLAN:    1. Transition of care performed with sharing of clinical summary Completed, see above notes.  Reports that he is doing much better and she is maintaining her self with taking her antinausea medication when she takes her medicines.  Reports that she will be following up with GI in the next week or 2.  Additionally ordered labs for follow-up on her hemoglobin she reports that she will get this this Friday.  Overall she is feeling better.   2. Anemia, unspecified type Stable, suggestion for follow-up in 1 week outside of discharge.  These were placed for drawl on Friday at [facility].  Pending results will address anything that needs to be treated.  - CBC with Differential/Platelet; Future - Comprehensive metabolic panel; Future  3. Gastroparesis Reports that she has not had any vomiting.  Reports that she is taking antinausea medication every time she takes her medicine so that she does not get sick.  Reports that she is starting to feel a little bit better.  We will be checking her  electrolytes Friday.  Additionally she is advised to follow-up with the gastroenterologist in the next week or 2.  She reports that she will be doing this.  Appreciate collaboration in her care if you need anything from PCP just let us know.  - Comprehensive metabolic panel; Future  4. Nicotine dependence, cigarettes, with other nicotine-induced disorders Asked about quitting: confirms they are currently smokes cigarettes Advise to quit smoking: Educated about QUITTING to reduce the risk of cancer, cardio and cerebrovascular disease. Assess willingness: Unwilling to quit at this time, but is working on cutting back. Assist with counseling and pharmacotherapy: Counseled for 5 minutes and literature provided. Arrange for follow up:  If not quitting follow up in 3 months and continue to offer help.    5. Cannabinoid hyperemesis syndrome ([place]) Encouraged to remain free of marijuana use.  Reports that she is not smoking at this time.  Unable to confirm this in outpatient setting on a telemedicine visit without a lab.  Educated on the benefits of not smoking.   Time:   Today, I have spent 15 minutes with the patient with telehealth technology discussing the above problems.     Medication Adjustments/Labs and Tests Ordered: Current medicines are reviewed at length with the patient today.  Concerns regarding medicines are outlined above.   Tests Ordered: Orders Placed This Encounter  Procedures   CBC with Differential/Platelet   Comprehensive metabolic panel    Medication Changes: No orders of the defined types were placed in this encounter.   Disposition:  Follow up in 2 month(s)  Signed, Perlie Mayo, NP  06/08/2018 4:31 PM     [facility]

## 2018-06-10 ENCOUNTER — Ambulatory Visit (HOSPITAL_COMMUNITY): Admission: RE | Admit: 2018-06-10 | Payer: Medicare HMO | Source: Ambulatory Visit

## 2018-06-16 ENCOUNTER — Ambulatory Visit (HOSPITAL_COMMUNITY): Payer: Medicare HMO

## 2018-06-17 ENCOUNTER — Other Ambulatory Visit (HOSPITAL_COMMUNITY): Payer: Medicaid Other

## 2018-06-21 DIAGNOSIS — F333 Major depressive disorder, recurrent, severe with psychotic symptoms: Secondary | ICD-10-CM | POA: Diagnosis not present

## 2018-06-21 DIAGNOSIS — F251 Schizoaffective disorder, depressive type: Secondary | ICD-10-CM | POA: Diagnosis not present

## 2018-06-22 ENCOUNTER — Other Ambulatory Visit (HOSPITAL_COMMUNITY)
Admission: RE | Admit: 2018-06-22 | Discharge: 2018-06-22 | Disposition: A | Discharge status: Home / Self Care | Payer: Medicare HMO | Source: Ambulatory Visit | Attending: Family Medicine | Admitting: Family Medicine

## 2018-06-22 ENCOUNTER — Other Ambulatory Visit: Payer: Self-pay

## 2018-06-22 DIAGNOSIS — K3184 Gastroparesis: Secondary | ICD-10-CM | POA: Diagnosis not present

## 2018-06-22 DIAGNOSIS — D649 Anemia, unspecified: Secondary | ICD-10-CM | POA: Insufficient documentation

## 2018-06-22 LAB — CBC WITH DIFFERENTIAL/PLATELET
Abs Immature Granulocytes: 0.02 10*3/uL (ref 0.00–0.07)
Basophils Absolute: 0 10*3/uL (ref 0.0–0.1)
Basophils Relative: 1 %
Eosinophils Absolute: 0.1 10*3/uL (ref 0.0–0.5)
Eosinophils Relative: 2 %
HCT: 37.9 % (ref 36.0–46.0)
Hemoglobin: 11.8 g/dL — ABNORMAL LOW (ref 12.0–15.0)
Immature Granulocytes: 0 %
Lymphocytes Relative: 26 %
Lymphs Abs: 1.8 10*3/uL (ref 0.7–4.0)
MCH: 30.6 pg (ref 26.0–34.0)
MCHC: 31.1 g/dL (ref 30.0–36.0)
MCV: 98.2 fL (ref 80.0–100.0)
Monocytes Absolute: 0.5 10*3/uL (ref 0.1–1.0)
Monocytes Relative: 7 %
Neutro Abs: 4.7 10*3/uL (ref 1.7–7.7)
Neutrophils Relative %: 64 %
Platelets: 183 10*3/uL (ref 150–400)
RBC: 3.86 MIL/uL — ABNORMAL LOW (ref 3.87–5.11)
RDW: 13.2 % (ref 11.5–15.5)
WBC: 7.2 10*3/uL (ref 4.0–10.5)
nRBC: 0 % (ref 0.0–0.2)

## 2018-06-22 LAB — COMPREHENSIVE METABOLIC PANEL
ALT: 24 U/L (ref 0–44)
AST: 26 U/L (ref 15–41)
Albumin: 4.6 g/dL (ref 3.5–5.0)
Alkaline Phosphatase: 65 U/L (ref 38–126)
Anion gap: 9 (ref 5–15)
BUN: 24 mg/dL — ABNORMAL HIGH (ref 6–20)
CO2: 24 mmol/L (ref 22–32)
Calcium: 9.9 mg/dL (ref 8.9–10.3)
Chloride: 110 mmol/L (ref 98–111)
Creatinine, Ser: 0.9 mg/dL (ref 0.44–1.00)
GFR calc Af Amer: >60 mL/min (ref >60)
GFR calc non Af Amer: >60 mL/min (ref >60)
Glucose, Bld: 87 mg/dL (ref 70–99)
Potassium: 4 mmol/L (ref 3.5–5.1)
Sodium: 143 mmol/L (ref 135–145)
Total Bilirubin: 0.5 mg/dL (ref 0.3–1.2)
Total Protein: 7.8 g/dL (ref 6.5–8.1)

## 2018-06-23 ENCOUNTER — Other Ambulatory Visit: Payer: Self-pay | Admitting: Family Medicine

## 2018-06-24 ENCOUNTER — Ambulatory Visit (HOSPITAL_COMMUNITY): Payer: Medicaid Other | Admitting: Nurse Practitioner

## 2018-06-26 ENCOUNTER — Other Ambulatory Visit (INDEPENDENT_AMBULATORY_CARE_PROVIDER_SITE_OTHER): Payer: Self-pay

## 2018-06-26 ENCOUNTER — Other Ambulatory Visit: Payer: Self-pay | Admitting: Family Medicine

## 2018-06-27 MED ORDER — TIZANIDINE HCL 4 MG PO TABS: ORAL_TABLET | ORAL | 1 refills | Status: DC

## 2018-06-27 MED ORDER — GABAPENTIN 100 MG PO CAPS: 100.0000 mg | ORAL_CAPSULE | Freq: Three times a day (TID) | ORAL | 5 refills | Status: DC

## 2018-06-28 NOTE — Progress Notes (Signed)
Overall labs are good, appears that she was dehydrated. Please encourage increase in water intake daily 6-8 cups is best for good hydration. Blood levels are slightly improved and stable. Encourage a good diet with iron intake.

## 2018-06-29 ENCOUNTER — Other Ambulatory Visit: Payer: Self-pay

## 2018-06-29 ENCOUNTER — Ambulatory Visit (INDEPENDENT_AMBULATORY_CARE_PROVIDER_SITE_OTHER): Payer: Medicare HMO | Admitting: Family Medicine

## 2018-06-29 ENCOUNTER — Encounter: Payer: Self-pay | Admitting: Family Medicine

## 2018-06-29 VITALS — Ht 66.0 in | Wt 143.0 lb

## 2018-06-29 DIAGNOSIS — K3184 Gastroparesis: Secondary | ICD-10-CM | POA: Diagnosis not present

## 2018-06-29 DIAGNOSIS — D649 Anemia, unspecified: Secondary | ICD-10-CM

## 2018-06-29 MED ORDER — METOCLOPRAMIDE HCL 10 MG PO TABS: 10.0000 mg | ORAL_TABLET | Freq: Three times a day (TID) | ORAL | 0 refills | Status: DC | PRN

## 2018-06-29 NOTE — Progress Notes (Signed)
Subjective:     Patient ID: Tracey Daniel, female   DOB: Jan 04, 1981, 37 y.o.   MRN: [id number]  Location of Patient: Home Location of Provider: Telehealth Consent was obtain for visit to be over via telehealth. I verified that I am speaking with the correct person using two identifiers.   Tracey Daniel presents for Anemia (follow up) Tracey Daniel hemoglobin has improved from 10.2-11.8.  She reports that she is feeling much better since being in the hospital last month.  Additionally she reports that she is eating much better.  Needs a refill on her Reglan so that she can take her medications.  As long as she is taking her antinausea medication she is able to take all of her medications without issue.  And does not have any nausea or vomiting.  Denies having any cough, fever, chills, shortness of breath, chest pain, any excessive nausea, headaches, dizziness, vision changes.  Past Medical, Surgical, Social History, Allergies, and Medications have been Reviewed.   Past Medical History:  Diagnosis Date  . Anemia of other chronic disease 11/29/2012  . Asthma   . Back pain, chronic   . Chronic abdominal pain   . Constipation   . COPD (chronic obstructive pulmonary disease) ([place])   . Cyst of skin    mid spine area  . Depression 2000   h/o suicidal ideation  . Gastric outlet obstruction   . Gastritis   . Gastroparesis   . Migraine   . Migraines   . Nausea and vomiting    recurrent  . Nicotine addiction   . Osteoporosis   . Peptic ulcer disease   . Pyloric spasm 03/30/2011  . Seasonal allergies   . Sinusitis   . Substance abuse ([place]) 2008   marijuana  . Vitamin D deficiency    Past Surgical History:  Procedure Laterality Date  . BALLOON DILATION N/A 02/09/2013   Procedure: BALLOON DILATION;  Surgeon: Missy Sabins, MD;  Location: [facility];  Service: Endoscopy;  Laterality: N/A;  . BALLOON DILATION N/A 03/10/2018   Procedure: BALLOON DILATION;  Surgeon: Daneil Dolin, MD;  [facility];  Service: Endoscopy;  Laterality: N/A;  . BILIARY DILATION  06/01/2018   Procedure: PYLORIC DILATION;  Surgeon: Rush Landmark Telford Nab., MD;  Location: [place];  Service: Gastroenterology;;  . BIOPSY  06/01/2018   Procedure: BIOPSY;  Surgeon: Irving Copas., MD;  Location: [facility];  Service: Gastroenterology;;  . Lum Keas INJECTION N/A 02/09/2013   Procedure: BOTOX INJECTION;  Surgeon: Missy Sabins, MD;  Location: [facility];  Service: Endoscopy;  Laterality: N/A;  possible balloon  . BOTOX INJECTION N/A 10/24/2015   Procedure: BOTOX INJECTION;  Surgeon: Daneil Dolin, MD;  [facility];  Service: Endoscopy;  Laterality: N/A;  . BOTOX INJECTION N/A 03/10/2018   Procedure: BOTOX INJECTION;  Surgeon: Daneil Dolin, MD;  [facility];  Service: Endoscopy;  Laterality: N/A;  Tracey Daniel notified  . BOTOX INJECTION  06/01/2018   Procedure: BOTOX INJECTION;  Surgeon: Rush Landmark Telford Nab., MD;  Location: [place];  Service: Gastroenterology;;  . CARPAL TUNNEL RELEASE     left hand  . CHOLECYSTECTOMY     ?2002  . COLONOSCOPY WITH PROPOFOL N/A 09/20/2014   RMR: Normal ileo-colonoscopy  . ESOPHAGEAL DILATION  12/25/2010   Procedure: ESOPHAGEAL DILATION;  Surgeon: Dorothyann Peng, MD;  [facility];  Service: Endoscopy;;  . ESOPHAGOGASTRODUODENOSCOPY  12/25/2010  Dorothyann Peng, MD;  moderate gastritis, ?goo secondary to pylorspasm. BX showed reactive gstropathy no h.pyori, SB mucosa with intramucosal lymphocytosis and partial villous blunting (TTG 4.0 normal)  . ESOPHAGOGASTRODUODENOSCOPY N/A 11/14/2012   MPN:TIRWE hiatal hernia. Abnormal gastric mucosa of  uncertain significance-status post biopsy. Subjectively, patient may have recurrent symptomatic, pylorospasm.  . ESOPHAGOGASTRODUODENOSCOPY (EGD) WITH PROPOFOL N/A 02/09/2013   elongated stomach, partial lower esophageal ring widely patent. No obvious pyloric  stenosis s/p Botox  . ESOPHAGOGASTRODUODENOSCOPY (EGD) WITH PROPOFOL N/A 10/24/2015   Procedure: ESOPHAGOGASTRODUODENOSCOPY (EGD) WITH PROPOFOL;  Surgeon: Daneil Dolin, MD;  [facility];  Service: Endoscopy;  Laterality: N/A;  830   . ESOPHAGOGASTRODUODENOSCOPY (EGD) WITH PROPOFOL N/A 03/10/2018   Dr. Gala Romney: normal esophagus, elongated, dilated stomach likely stigmata of slow gastric emptying due to narrowing of pyloric channel, s/p balloon dilatation of pyloric channel. Small hiatal hernia, normal duodenal bulb and second portion of duodenum  . ESOPHAGOGASTRODUODENOSCOPY (EGD) WITH PROPOFOL N/A 06/01/2018   Procedure: ESOPHAGOGASTRODUODENOSCOPY (EGD) WITH PROPOFOL;  Surgeon: Rush Landmark Telford Nab., MD;  Location: [place];  Service: Gastroenterology;  Laterality: N/A;  . laser surgery on cervix    . NASAL SEPTUM SURGERY  01/29/2017  . TOE SURGERY     Social History   Socioeconomic History  . Marital status: Significant Other    Spouse name: Not on file  . Number of children: 0  . Years of education: 9  . Highest education level: 9th grade  Occupational History  . Occupation: disability    Employer: UNEMPLOYED  Social Needs  . Financial resource strain: Not very hard  . Food insecurity:    Worry: Sometimes true    Inability: Sometimes true  . Transportation needs:    Medical: No    Non-medical: No  Tobacco Use  . Smoking status: Current Every Day Smoker    Packs/day: 0.10    Years: 15.00    Pack years: 1.50    Types: Cigarettes  . Smokeless tobacco: Never Used  . Tobacco comment: 2 per day  Substance and Sexual Activity  . Alcohol use: No    Alcohol/week: 0.0 standard drinks  . Drug use: Yes    Types: Marijuana    Comment: As of 05/31/2018: last use 3 weeks ago  . Sexual activity: Yes    Birth control/protection: None  Lifestyle  . Physical activity:    Days per week: 3 days    Minutes per session: 30 min  . Stress: Not at all  Relationships  .  Social connections:    Talks on phone: More than three times a week    Gets together: Twice a week    Attends religious service: Never    Active member of club or organization: No    Attends meetings of clubs or organizations: Never    Relationship status: Living with partner  . Intimate partner violence:    Fear of current or ex partner: No    Emotionally abused: No    Physically abused: No    Forced sexual activity: No  Other Topics Concern  . Not on file  Social History Narrative  . Not on file    Outpatient Encounter Medications as of 06/29/2018  Medication Sig  . acetaminophen (TYLENOL) 325 MG tablet Take 2 tablets (650 mg total) by mouth every 6 (six) hours as needed for mild pain (or Fever >/= 101).  Marland Kitchen azelastine (ASTELIN) 0.1 % nasal spray USE 2 SPRAYS IN EACH NOSTRIL TWICE DAILY  AS DIRECTED  . estradiol (ESTRACE) 1 MG tablet Take 1 mg by mouth daily.  . feeding supplement (ENSURE CLINICAL STRENGTH) LIQD Take 237 mLs by mouth 3 (three) times daily with meals.  . fluticasone (FLONASE) 50 MCG/ACT nasal spray SHAKE LIQUID AND USE 2 SPRAYS IN EACH NOSTRIL DAILY (Patient taking differently: Place 2 sprays into both nostrils daily. )  . gabapentin (NEURONTIN) 100 MG capsule Take 1 capsule (100 mg total) by mouth 3 (three) times daily.  . hydroxychloroquine (PLAQUENIL) 200 MG tablet Take 200 mg by mouth daily.  Marland Kitchen LINZESS 290 MCG CAPS capsule TAKE 1 CAPSULE(290 MCG) BY MOUTH DAILY (Patient taking differently: Take 290 mcg by mouth daily. )  . metoCLOPramide (REGLAN) 10 MG tablet Take 1 tablet (10 mg total) by mouth every 8 (eight) hours as needed for nausea.  . pantoprazole (PROTONIX) 40 MG tablet Take 1 tablet (40 mg total) by mouth daily. (Patient taking differently: Take 40 mg by mouth 2 (two) times daily. )  . potassium chloride SA (K-DUR) 20 MEQ tablet TAKE 1 TABLET BY MOUTH EVERY DAY  . pravastatin (PRAVACHOL) 20 MG tablet Take 1 tablet (20 mg total) by mouth daily.  .  promethazine (PHENERGAN) 25 MG suppository Place 1 suppository (25 mg total) rectally every 6 (six) hours as needed for nausea or vomiting.  . propranolol (INDERAL) 10 MG tablet Take 1 tablet (10 mg total) by mouth 3 (three) times daily.  . risperiDONE (RISPERDAL) 0.5 MG tablet Take 0.5 mg at bedtime by mouth.  . sertraline (ZOLOFT) 50 MG tablet Take 50 mg by mouth at bedtime.   Marland Kitchen tiZANidine (ZANAFLEX) 4 MG tablet TAKE 1 TABLET(4 MG) BY MOUTH BACK TWICE DAILY AS NEEDED FOR SPASMS  . zonisamide (ZONEGRAN) 50 MG capsule Take 150 mg by mouth at bedtime.    No facility-administered encounter medications on file as of 06/29/2018.    Allergies  Allergen Reactions  . Benadryl [Diphenhydramine] Anaphylaxis  . Penicillins Other (See Comments)    Unknown Has patient had a PCN reaction causing immediate rash, facial/tongue/throat swelling, SOB or lightheadedness with hypotension Yes Has patient had a PCN reaction causing severe rash involving mucus membranes or skin necrosis: Yes-hives  Has patient had a PCN reaction that required hospitalization Yes Has patient had a PCN reaction occurring within the last 10 years: Yes If all of the above answers are "NO", then may proceed with Cephalosporin use.   . Latex Itching and Other (See Comments)    "burning" where the tape goes    Review of Systems  Constitutional: Negative for activity change, appetite change, chills, fatigue and fever.  HENT: Negative.   Eyes: Negative.   Respiratory: Negative.  Negative for cough and shortness of breath.   Cardiovascular: Negative.  Negative for chest pain.  Gastrointestinal: Negative.  Negative for nausea.  Endocrine: Negative.   Genitourinary: Negative.   Musculoskeletal: Negative.   Skin: Negative.   Allergic/Immunologic: Negative.   Neurological: Negative.  Negative for dizziness and headaches.  Hematological: Negative.   Psychiatric/Behavioral: Negative.   All other systems reviewed and are negative.       Objective:     Ht 5\' 6"  (1.676 m)   Wt 143 lb (64.9 kg)   BMI 23.08 kg/m   Physical Exam  General: Alert and oriented Respiratory: No noted shortness of breath during conversation Psych: Normal affect, mood and good communication     Assessment and Plan        1. Anemia, unspecified type  Improved, advised to continue eating a well balanced diet and making sure she gets all her vitamins she needs. Continue Ensure daily.   2. Gastroparesis Controlled, needs refill of Reglan provided today.  - metoCLOPramide (REGLAN) 10 MG tablet; Take 1 tablet (10 mg total) by mouth every 8 (eight) hours as needed for nausea.  Dispense: 20 tablet; Refill: 0    Return in about 2 months (around 08/29/2018) for dr simpson .  I provided 5 minutes of non-face-to-face time during this encounter.   Perlie Mayo, DNP, AGNP-BC [address] [place], [place] [address] Office Hours: Mon-Thurs 8 am-5 pm; Fri 8 am-12 pm Office Phone:  [phone]  Office Fax: [phone]

## 2018-06-29 NOTE — Patient Instructions (Addendum)
    Thank you for coming into the office today. I appreciate the opportunity to provide you with the care for your health and wellness. Today we discussed: your blood levels  FOLLOW UP IN 2 months with Dr Sherrine Maples that you are continuing to feel better.  Please continue to take your medications as prescribed and directed.  Please remain off of any smoking and or use of marijuana.  Please try to refrain from smoking cigarettes if possible.  If you need any patches or any items that we can provide to you to help with your smoking sensation let us know.  Please continue to practice social distancing during this time to keep you, your family and our community safe.  [place] YOUR HANDS WELL AND FREQUENTLY. AVOID TOUCHING YOUR FACE, UNLESS YOUR HANDS ARE FRESHLY WASHED.  GET FRESH AIR DAILY. STAY HYDRATED WITH WATER.   It was a pleasure to see you and I look forward to continuing to work together on your health and well-being. Please do not hesitate to call the office if you need care or have questions about your care.  Have a wonderful day and week. With Gratitude, Cherly Beach, DNP, AGNP-BC

## 2018-06-30 ENCOUNTER — Other Ambulatory Visit: Payer: Self-pay

## 2018-06-30 ENCOUNTER — Encounter: Payer: Self-pay | Admitting: Nurse Practitioner

## 2018-06-30 ENCOUNTER — Ambulatory Visit (INDEPENDENT_AMBULATORY_CARE_PROVIDER_SITE_OTHER): Payer: Medicare HMO | Admitting: Nurse Practitioner

## 2018-06-30 DIAGNOSIS — F12988 Cannabis use, unspecified with other cannabis-induced disorder: Secondary | ICD-10-CM | POA: Diagnosis not present

## 2018-06-30 DIAGNOSIS — K313 Pylorospasm, not elsewhere classified: Secondary | ICD-10-CM

## 2018-06-30 DIAGNOSIS — K311 Adult hypertrophic pyloric stenosis: Secondary | ICD-10-CM

## 2018-06-30 DIAGNOSIS — R112 Nausea with vomiting, unspecified: Secondary | ICD-10-CM

## 2018-06-30 DIAGNOSIS — F1290 Cannabis use, unspecified, uncomplicated: Secondary | ICD-10-CM

## 2018-06-30 NOTE — Assessment & Plan Note (Signed)
Repeated, cyclic gastric outlet obstruction due to pyloric spasm and pyloric stenosis.  She is undergone multiple dilations and Botox injections with a pattern of intractable nausea and vomiting, EGD with dilation/injection, relief from her symptoms for a time, recurrent intractable nausea and vomiting, etc.  The cycle has repeated itself at least 2 or 3 times.  At this point I think surgical discussion is warranted.  We will refer to [facility] surgery in [place] as per above.  Follow-up in our office in 2 months.

## 2018-06-30 NOTE — Assessment & Plan Note (Signed)
Has previously been diagnosed with cannabinoid hyperemesis syndrome.  Previously felt was contributing to her nausea and vomiting.  However, she has abstained from marijuana for at least the past 2 to 3 months.  I do not feel this is a likely contributor at this point.  Her nausea and vomiting has improved after EGD with pyloric dilation and Botox, as has previously occurred.  Continue to monitor, recommend continued abstinence from cannabis products.  Follow-up in 2 months

## 2018-06-30 NOTE — Progress Notes (Signed)
cc'ed to pcp °

## 2018-06-30 NOTE — Progress Notes (Signed)
Referring Provider: [name], MD Primary Care Physician:  [name], MD Primary GI:  Dr. Gala Romney  NOTE: Service was provided via telemedicine and was requested by the patient due to COVID-19 pandemic.  Method of visit: Doxy.Me  Patient Location: Home  Provider Location: Office  Reason for Phone Visit: Hospital follow-up  The patient was consented to phone follow-up via telephone encounter including billing of the encounter (yes/no): Yes  Persons present on the phone encounter, with roles: Mother-in-law  Total time (minutes) spent on medical discussion: 20 minutes  Chief Complaint  Patient presents with  . Follow-up    2 wk FU from hosp,EGD    HPI:   Tracey Daniel is a 37 y.o. female who presents for virtual visit regarding: We hospital follow-up.  The patient was admitted to the hospital from 05/31/2018 through 06/03/2026 and at [facility].  Presented with worsening abdominal pain associated with nausea and vomiting.  Blood cell count was elevated at 12.2, week positive hCG of 5.1, normal lipase is 24.  She does note chronic marijuana use possibly contributing to her current symptoms (cannabinoid hyperemesis syndrome).  CT of the abdomen and pelvis on 05/29/2018 was negative for acute issues.  She was discharged on a full liquid diet and recommended follow-up with primary GI.  The gastroenterologist who rounded on her in the hospital was unsure about any possible role for surgical consult given normal-appearing pyloric valve.  EGD was recently completed 06/01/2018 which found esophagus status post biopsy, small hiatal hernia, erythematous mucosa in the cardia, gastric fundus, and gastric body.  Erosive gastropathy status post biopsy, normal pylorus which was dilated and injected with Botox.  Duodenal mucosa status post biopsy.  Surgical pathology found duodenum, stomach, esophageal biopsies all essentially benign mucosa.  Previously as an outpatient  she was seen 05/31/2018 for pyloric spasms, cannabinoid hyperemesis syndrome, generalized abdominal pain.  She did have an exacerbation of her symptoms 2 weeks prior when she smoked marijuana.  Recommended abstinence from marijuana.  Chronic history of GOO status post Botox injection previously.  At a previous admission in February recommended surgical referral for pyloroplasty if any persistent symptomatic pyloric stenosis.  She was referred to the emergency department due to persistent nausea and vomiting making it difficult to keep down food and fluids.  Today she states she's doing ok overall. She states her N/V was improved with dilation and botox. Still cannot eat too much. Typical meal is eggs and sausage; lunch/dinner chicken and greens; sometimes eats some fruit. Still doing Ensure twice daily. Food staying down ok. She has been taking antiemetic before eating to prevent nausea/vomiting. Denies abdominal pain, hematochezia, melena, fever, chills. States she has lost about 6 lbs over the last 3 weeks. No changes in bowel movements. Intermittent/rare abdominal pain which resolves after a bowel movement; stools are occasionally/rarely loose. GERD well managed on PPI. Denies URI or flu-like symptoms. Denies loss of sense of taste or smell. Denies chest pain, dyspnea, dizziness, lightheadedness, syncope, near syncope. Denies any other upper or lower GI symptoms.  Past Medical History:  Diagnosis Date  . Anemia of other chronic disease 11/29/2012  . Asthma   . Back pain, chronic   . Chronic abdominal pain   . Constipation   . COPD (chronic obstructive pulmonary disease) ([place])   . Cyst of skin    mid spine area  . Depression 2000   h/o suicidal ideation  . Gastric outlet obstruction   . Gastritis   .  Gastroparesis   . Migraine   . Migraines   . Nausea and vomiting    recurrent  . Nicotine addiction   . Osteoporosis   . Peptic ulcer disease   . Pyloric spasm 03/30/2011  . Seasonal allergies    . Sinusitis   . Substance abuse ([place]) 2008   marijuana  . Vitamin D deficiency     Past Surgical History:  Procedure Laterality Date  . BALLOON DILATION N/A 02/09/2013   Procedure: BALLOON DILATION;  Surgeon: Missy Sabins, MD;  Location: [facility];  Service: Endoscopy;  Laterality: N/A;  . BALLOON DILATION N/A 03/10/2018   Procedure: BALLOON DILATION;  Surgeon: Daneil Dolin, MD;  Location: [facility];  Service: Endoscopy;  Laterality: N/A;  . BILIARY DILATION  06/01/2018   Procedure: PYLORIC DILATION;  Surgeon: Rush Landmark Telford Nab., MD;  Location: [place];  Service: Gastroenterology;;  . BIOPSY  06/01/2018   Procedure: BIOPSY;  Surgeon: Irving Copas., MD;  Location: [facility] ENDOSCOPY;  Service: Gastroenterology;;  . Lum Keas INJECTION N/A 02/09/2013   Procedure: BOTOX INJECTION;  Surgeon: Missy Sabins, MD;  Location: [facility];  Service: Endoscopy;  Laterality: N/A;  possible balloon  . BOTOX INJECTION N/A 10/24/2015   Procedure: BOTOX INJECTION;  Surgeon: Daneil Dolin, MD;  [facility];  Service: Endoscopy;  Laterality: N/A;  . BOTOX INJECTION N/A 03/10/2018   Procedure: BOTOX INJECTION;  Surgeon: Daneil Dolin, MD;  [facility];  Service: Endoscopy;  Laterality: N/A;  Melanie notified  . BOTOX INJECTION  06/01/2018   Procedure: BOTOX INJECTION;  Surgeon: Rush Landmark Telford Nab., MD;  Location: [place];  Service: Gastroenterology;;  . CARPAL TUNNEL RELEASE     left hand  . CHOLECYSTECTOMY     ?2002  . COLONOSCOPY WITH PROPOFOL N/A 09/20/2014   RMR: Normal ileo-colonoscopy  . ESOPHAGEAL DILATION  12/25/2010   Procedure: ESOPHAGEAL DILATION;  Surgeon: Dorothyann Peng, MD;  [facility];  Service: Endoscopy;;  . ESOPHAGOGASTRODUODENOSCOPY  12/25/2010   Dorothyann Peng, MD;  moderate gastritis, ?goo secondary to pylorspasm. BX showed reactive gstropathy no h.pyori, SB mucosa with intramucosal lymphocytosis and partial villous  blunting (TTG 4.0 normal)  . ESOPHAGOGASTRODUODENOSCOPY N/A 11/14/2012   PPJ:KDTOI hiatal hernia. Abnormal gastric mucosa of  uncertain significance-status post biopsy. Subjectively, patient may have recurrent symptomatic, pylorospasm.  . ESOPHAGOGASTRODUODENOSCOPY (EGD) WITH PROPOFOL N/A 02/09/2013   elongated stomach, partial lower esophageal ring widely patent. No obvious pyloric stenosis s/p Botox  . ESOPHAGOGASTRODUODENOSCOPY (EGD) WITH PROPOFOL N/A 10/24/2015   Procedure: ESOPHAGOGASTRODUODENOSCOPY (EGD) WITH PROPOFOL;  Surgeon: Daneil Dolin, MD;  [facility];  Service: Endoscopy;  Laterality: N/A;  830   . ESOPHAGOGASTRODUODENOSCOPY (EGD) WITH PROPOFOL N/A 03/10/2018   Dr. Gala Romney: normal esophagus, elongated, dilated stomach likely stigmata of slow gastric emptying due to narrowing of pyloric channel, s/p balloon dilatation of pyloric channel. Small hiatal hernia, normal duodenal bulb and second portion of duodenum  . ESOPHAGOGASTRODUODENOSCOPY (EGD) WITH PROPOFOL N/A 06/01/2018   Procedure: ESOPHAGOGASTRODUODENOSCOPY (EGD) WITH PROPOFOL;  Surgeon: Rush Landmark Telford Nab., MD;  Location: [place];  Service: Gastroenterology;  Laterality: N/A;  . laser surgery on cervix    . NASAL SEPTUM SURGERY  01/29/2017  . TOE SURGERY      Current Outpatient Medications  Medication Sig Dispense Refill  . acetaminophen (TYLENOL) 325 MG tablet Take 2 tablets (650 mg total) by mouth every 6 (six) hours as needed for mild pain (or  Fever >/= 101). 20 tablet 0  . azelastine (ASTELIN) 0.1 % nasal spray USE 2 SPRAYS IN EACH NOSTRIL TWICE DAILY AS DIRECTED 30 mL 5  . estradiol (ESTRACE) 1 MG tablet Take 1 mg by mouth daily.    . feeding supplement (ENSURE CLINICAL STRENGTH) LIQD Take 237 mLs by mouth 3 (three) times daily with meals. 10 Bottle 3  . fluticasone (FLONASE) 50 MCG/ACT nasal spray SHAKE LIQUID AND USE 2 SPRAYS IN EACH NOSTRIL DAILY (Patient taking differently: Place 2 sprays into both  nostrils daily. ) 48 g 5  . gabapentin (NEURONTIN) 100 MG capsule Take 1 capsule (100 mg total) by mouth 3 (three) times daily. 90 capsule 5  . hydroxychloroquine (PLAQUENIL) 200 MG tablet Take 200 mg by mouth daily.    Marland Kitchen LINZESS 290 MCG CAPS capsule TAKE 1 CAPSULE(290 MCG) BY MOUTH DAILY (Patient taking differently: Take 290 mcg by mouth daily. ) 90 capsule 3  . metoCLOPramide (REGLAN) 10 MG tablet Take 1 tablet (10 mg total) by mouth every 8 (eight) hours as needed for nausea. 20 tablet 0  . pantoprazole (PROTONIX) 40 MG tablet Take 1 tablet (40 mg total) by mouth daily. (Patient taking differently: Take 40 mg by mouth 2 (two) times daily. ) 30 tablet 4  . potassium chloride SA (K-DUR) 20 MEQ tablet TAKE 1 TABLET BY MOUTH EVERY DAY 90 tablet 3  . pravastatin (PRAVACHOL) 20 MG tablet Take 1 tablet (20 mg total) by mouth daily. 90 tablet 3  . promethazine (PHENERGAN) 25 MG suppository Place 1 suppository (25 mg total) rectally every 6 (six) hours as needed for nausea or vomiting. 6 each 0  . propranolol (INDERAL) 10 MG tablet Take 1 tablet (10 mg total) by mouth 3 (three) times daily. 90 tablet 2  . risperiDONE (RISPERDAL) 0.5 MG tablet Take 0.5 mg at bedtime by mouth.    . sertraline (ZOLOFT) 50 MG tablet Take 50 mg by mouth at bedtime.     Marland Kitchen tiZANidine (ZANAFLEX) 4 MG tablet TAKE 1 TABLET(4 MG) BY MOUTH BACK TWICE DAILY AS NEEDED FOR SPASMS 180 tablet 1  . zonisamide (ZONEGRAN) 50 MG capsule Take 150 mg by mouth at bedtime.   2   No current facility-administered medications for this visit.     Allergies as of 06/30/2018 - Review Complete 06/30/2018  Allergen Reaction Noted  . Benadryl [diphenhydramine] Anaphylaxis 03/25/2014  . Penicillins Other (See Comments)   . Latex Itching and Other (See Comments) 11/30/2011    Family History  Problem Relation Age of Onset  . Diabetes Father   . Liver disease Father        liver transplant at [facility], age 61  . Lung cancer Mother 67  . Heart disease  Maternal Grandmother   . Parkinson's disease Maternal Grandfather   . Multiple sclerosis Sister 24  . Depression Sister 16  . Alcohol abuse Sister   . Bipolar disorder Sister   . Schizophrenia Sister   . Colon cancer Neg Hx     Social History   Socioeconomic History  . Marital status: Significant Other    Spouse name: Not on file  . Number of children: 0  . Years of education: 9  . Highest education level: 9th grade  Occupational History  . Occupation: disability    Employer: UNEMPLOYED  Social Needs  . Financial resource strain: Not very hard  . Food insecurity    Worry: Sometimes true    Inability: Sometimes true  . Transportation  needs    Medical: No    Non-medical: No  Tobacco Use  . Smoking status: Current Every Day Smoker    Packs/day: 0.10    Years: 15.00    Pack years: 1.50    Types: Cigarettes  . Smokeless tobacco: Never Used  . Tobacco comment: 2 per day  Substance and Sexual Activity  . Alcohol use: No    Alcohol/week: 0.0 standard drinks  . Drug use: Not Currently    Types: Marijuana    Comment: As of 06/30/18: last use 2 months ago  . Sexual activity: Yes    Birth control/protection: None  Lifestyle  . Physical activity    Days per week: 3 days    Minutes per session: 30 min  . Stress: Not at all  Relationships  . Social Herbalist on phone: More than three times a week    Gets together: Twice a week    Attends religious service: Never    Active member of club or organization: No    Attends meetings of clubs or organizations: Never    Relationship status: Living with partner  Other Topics Concern  . Not on file  Social History Narrative  . Not on file    Review of Systems: General: Negative for anorexia, weight loss, fever, chills, fatigue, weakness. ENT: Negative for hoarseness, difficulty swallowing. CV: Negative for chest pain, angina, palpitations, peripheral edema.  Respiratory: Negative for dyspnea at rest, cough, sputum,  wheezing.  GI: See history of present illness. Endo: Negative for unusual weight change.  Heme: Negative for bruising or bleeding. Allergy: Negative for rash or hives.  Physical Exam: Note: limited exam due to virtual visit General:   Alert and oriented. Pleasant and cooperative. Well-nourished and well-developed.  Head:  Normocephalic and atraumatic. Eyes:  Without icterus, sclera clear and conjunctiva pink.  Ears:  Normal auditory acuity. Skin:  Intact without facial significant lesions or rashes. Neurologic:  Alert and oriented x4;  grossly normal neurologically. Psych:  Alert and cooperative. Normal mood and affect. Heme/Lymph/Immune: No excessive bruising noted.

## 2018-06-30 NOTE — Assessment & Plan Note (Signed)
The patient has had multiple episodes of pylorospasm and pyloric stenosis resulting in gastric outlet obstruction.  She has had dilations with Botox injections in the past.  Most recently she was admitted to the hospital done at [facility] in mid May for similar symptoms.  When she was seen in the office she was unable to keep down food and fluids.  Again proceeded with an EGD with pyloric dilation and Botox.  Her symptoms are well managed at this point.  However, I am afraid that as has happened in the past she will do well for an initial term and then have a recurrence of gastric outlet obstruction and intractable nausea and vomiting.    At this point I think it is worth sending her for surgical referral to discuss any possible surgical options due to the repeated nature of her situation.  Follow-up here in 2 months.

## 2018-06-30 NOTE — Patient Instructions (Signed)
Your health issues we discussed today were:   Nausea and vomiting likely due to obstruction from your stomach emptying: 1. I am glad you are feeling better after your upper endoscopy with dilation and Botox injections 2. Continue taking your antiemetics as you have been to help prevent nausea 3. Congratulations on abstaining from marijuana for the past 2 to 3 months. 4. I highly recommend you avoid all marijuana (cannabis) products as they can trigger worsening nausea and vomiting 5. We will refer you to a surgeon to have a discussion about possible options to help prevent a repeat of your situation 6. Call us if you have any worsening or severe symptoms  Overall I recommend:  1. Continue your other current medications 2. Return for follow-up in our office in 2 months 3. Call us if you have any questions or concerns.   Because of recent events of COVID-19 ("Coronavirus"), follow CDC recommendations:  1. Wash your hand frequently 2. Avoid touching your face 3. Stay away from people who are sick 4. If you have symptoms such as fever, cough, shortness of breath then call your healthcare provider for further guidance 5. If you are sick, STAY AT HOME unless otherwise directed by your healthcare provider. 6. Follow directions from state and national officials regarding staying safe   At [facility] we value your feedback. You may receive a survey about your visit today. Please share your experience as we strive to create trusting relationships with our patients to provide genuine, compassionate, quality care.  We appreciate your understanding and patience as we review any laboratory studies, imaging, and other diagnostic tests that are ordered as we care for you. Our office policy is 5 business days for review of these results, and any emergent or urgent results are addressed in a timely manner for your best interest. If you do not hear from our office in 1 week, please contact  us.   We also encourage the use of MyChart, which contains your medical information for your review as well. If you are not enrolled in this feature, an access code is on this after visit summary for your convenience. Thank you for allowing [place] to be involved in your care.  It was great to see you today!  I hope you have a great summer!!

## 2018-07-04 ENCOUNTER — Ambulatory Visit: Payer: Medicare HMO | Admitting: Family Medicine

## 2018-07-18 DIAGNOSIS — M791 Myalgia, unspecified site: Secondary | ICD-10-CM | POA: Diagnosis not present

## 2018-07-18 DIAGNOSIS — G43719 Chronic migraine without aura, intractable, without status migrainosus: Secondary | ICD-10-CM | POA: Diagnosis not present

## 2018-07-18 DIAGNOSIS — L920 Granuloma annulare: Secondary | ICD-10-CM | POA: Diagnosis not present

## 2018-07-18 DIAGNOSIS — G518 Other disorders of facial nerve: Secondary | ICD-10-CM | POA: Diagnosis not present

## 2018-07-18 DIAGNOSIS — M542 Cervicalgia: Secondary | ICD-10-CM | POA: Diagnosis not present

## 2018-07-20 ENCOUNTER — Other Ambulatory Visit: Payer: Self-pay

## 2018-07-20 DIAGNOSIS — K3184 Gastroparesis: Secondary | ICD-10-CM

## 2018-07-20 MED ORDER — METOCLOPRAMIDE HCL 10 MG PO TABS: 10.0000 mg | ORAL_TABLET | Freq: Three times a day (TID) | ORAL | 0 refills | Status: DC | PRN

## 2018-07-21 ENCOUNTER — Other Ambulatory Visit: Payer: Self-pay

## 2018-07-21 ENCOUNTER — Ambulatory Visit (HOSPITAL_COMMUNITY)
Admission: RE | Admit: 2018-07-21 | Discharge: 2018-07-21 | Disposition: A | Discharge status: Home / Self Care | Payer: Medicare HMO | Source: Ambulatory Visit | Attending: Advanced Practice Midwife | Admitting: Advanced Practice Midwife

## 2018-07-21 DIAGNOSIS — Z1231 Encounter for screening mammogram for malignant neoplasm of breast: Secondary | ICD-10-CM | POA: Insufficient documentation

## 2018-07-21 DIAGNOSIS — L920 Granuloma annulare: Secondary | ICD-10-CM | POA: Diagnosis not present

## 2018-07-25 ENCOUNTER — Encounter: Payer: Self-pay | Admitting: Surgery

## 2018-07-25 DIAGNOSIS — K313 Pylorospasm, not elsewhere classified: Secondary | ICD-10-CM | POA: Diagnosis not present

## 2018-07-25 DIAGNOSIS — K581 Irritable bowel syndrome with constipation: Secondary | ICD-10-CM | POA: Diagnosis not present

## 2018-07-25 DIAGNOSIS — K30 Functional dyspepsia: Secondary | ICD-10-CM | POA: Diagnosis not present

## 2018-07-26 ENCOUNTER — Other Ambulatory Visit: Payer: Self-pay | Admitting: Surgery

## 2018-07-26 DIAGNOSIS — K30 Functional dyspepsia: Secondary | ICD-10-CM

## 2018-07-26 DIAGNOSIS — K581 Irritable bowel syndrome with constipation: Secondary | ICD-10-CM

## 2018-07-26 DIAGNOSIS — K313 Pylorospasm, not elsewhere classified: Secondary | ICD-10-CM

## 2018-08-01 ENCOUNTER — Other Ambulatory Visit: Payer: Self-pay

## 2018-08-01 ENCOUNTER — Encounter (HOSPITAL_COMMUNITY)
Admission: RE | Admit: 2018-08-01 | Discharge: 2018-08-01 | Disposition: A | Discharge status: Home / Self Care | Payer: Medicare HMO | Source: Ambulatory Visit | Attending: Surgery | Admitting: Surgery

## 2018-08-01 DIAGNOSIS — K313 Pylorospasm, not elsewhere classified: Secondary | ICD-10-CM | POA: Insufficient documentation

## 2018-08-01 DIAGNOSIS — K30 Functional dyspepsia: Secondary | ICD-10-CM | POA: Diagnosis not present

## 2018-08-01 DIAGNOSIS — R112 Nausea with vomiting, unspecified: Secondary | ICD-10-CM | POA: Diagnosis not present

## 2018-08-01 DIAGNOSIS — K581 Irritable bowel syndrome with constipation: Secondary | ICD-10-CM | POA: Insufficient documentation

## 2018-08-01 DIAGNOSIS — R1013 Epigastric pain: Secondary | ICD-10-CM | POA: Diagnosis not present

## 2018-08-01 MED ORDER — TECHNETIUM TC 99M SULFUR COLLOID
2.0000 | Freq: Once | INTRAVENOUS | Status: AC | PRN
  Administered 2018-08-01: 2 via ORAL

## 2018-08-03 ENCOUNTER — Ambulatory Visit: Payer: Medicare HMO | Admitting: Nurse Practitioner

## 2018-08-15 ENCOUNTER — Other Ambulatory Visit: Payer: Self-pay | Admitting: Gastroenterology

## 2018-08-15 ENCOUNTER — Other Ambulatory Visit (INDEPENDENT_AMBULATORY_CARE_PROVIDER_SITE_OTHER): Payer: Self-pay | Admitting: Orthopaedic Surgery

## 2018-08-16 ENCOUNTER — Other Ambulatory Visit: Payer: Self-pay | Admitting: Obstetrics and Gynecology

## 2018-08-16 ENCOUNTER — Other Ambulatory Visit: Payer: Self-pay | Admitting: Gastroenterology

## 2018-08-16 ENCOUNTER — Other Ambulatory Visit: Payer: Self-pay | Admitting: Family Medicine

## 2018-08-16 DIAGNOSIS — K3184 Gastroparesis: Secondary | ICD-10-CM

## 2018-08-16 DIAGNOSIS — K5909 Other constipation: Secondary | ICD-10-CM

## 2018-08-16 DIAGNOSIS — K313 Pylorospasm, not elsewhere classified: Secondary | ICD-10-CM

## 2018-08-16 DIAGNOSIS — K219 Gastro-esophageal reflux disease without esophagitis: Secondary | ICD-10-CM

## 2018-08-16 NOTE — Telephone Encounter (Signed)
Getting a request from pharmacy for carafate refill. I don't see where we still have her on this.   Please ask patient if she is requesting this refill. She does not need to stay on carafate long term.

## 2018-08-19 ENCOUNTER — Other Ambulatory Visit: Payer: Self-pay

## 2018-08-19 ENCOUNTER — Other Ambulatory Visit: Payer: Self-pay | Admitting: *Deleted

## 2018-08-19 MED ORDER — PANTOPRAZOLE SODIUM 40 MG PO TBEC: 40.0000 mg | DELAYED_RELEASE_TABLET | Freq: Two times a day (BID) | ORAL | 3 refills | Status: DC

## 2018-08-19 MED ORDER — DICLOFENAC SODIUM 75 MG PO TBEC: DELAYED_RELEASE_TABLET | ORAL | 1 refills | Status: DC

## 2018-08-19 MED ORDER — LINACLOTIDE 290 MCG PO CAPS: ORAL_CAPSULE | ORAL | 3 refills | Status: DC

## 2018-08-19 NOTE — Addendum Note (Signed)
Addended by: Mahala Menghini on: 08/19/2018 08:58 AM   Modules accepted: Orders

## 2018-08-19 NOTE — Telephone Encounter (Signed)
Received fax from [place] requesting prescriptions for Linzess 236mcg and Pantoprazole 40mg  90 days supply.

## 2018-08-22 ENCOUNTER — Other Ambulatory Visit: Payer: Self-pay

## 2018-08-22 DIAGNOSIS — K3184 Gastroparesis: Secondary | ICD-10-CM

## 2018-08-22 MED ORDER — POTASSIUM CHLORIDE CRYS ER 20 MEQ PO TBCR: EXTENDED_RELEASE_TABLET | ORAL | 3 refills | Status: DC

## 2018-08-22 MED ORDER — GABAPENTIN 100 MG PO CAPS: 100.0000 mg | ORAL_CAPSULE | Freq: Three times a day (TID) | ORAL | 5 refills | Status: DC

## 2018-08-22 MED ORDER — FLUTICASONE PROPIONATE 50 MCG/ACT NA SUSP: NASAL | 5 refills | Status: DC

## 2018-08-22 MED ORDER — PRAVASTATIN SODIUM 20 MG PO TABS: 20.0000 mg | ORAL_TABLET | Freq: Every day | ORAL | 3 refills | Status: DC

## 2018-08-22 MED ORDER — METOCLOPRAMIDE HCL 10 MG PO TABS: 10.0000 mg | ORAL_TABLET | Freq: Three times a day (TID) | ORAL | 0 refills | Status: DC | PRN

## 2018-08-26 MED ORDER — SUCRALFATE 1 G PO TABS: ORAL_TABLET | ORAL | 3 refills | Status: DC

## 2018-08-26 NOTE — Telephone Encounter (Signed)
rx done

## 2018-08-26 NOTE — Telephone Encounter (Signed)
Lmom, waiting on a return call.  

## 2018-08-26 NOTE — Addendum Note (Signed)
Addended by: Mahala Menghini on: 08/26/2018 01:01 PM   Modules accepted: Orders

## 2018-08-26 NOTE — Telephone Encounter (Signed)
Pt returned call. Pt does still take Carafate daily. Pt does want to get a refill of her medication.

## 2018-08-31 ENCOUNTER — Ambulatory Visit: Payer: Medicare HMO | Admitting: Family Medicine

## 2018-08-31 ENCOUNTER — Ambulatory Visit: Payer: Medicare HMO | Admitting: Gastroenterology

## 2018-09-05 ENCOUNTER — Ambulatory Visit: Payer: Medicare HMO | Admitting: Nurse Practitioner

## 2018-09-05 DIAGNOSIS — G43719 Chronic migraine without aura, intractable, without status migrainosus: Secondary | ICD-10-CM | POA: Diagnosis not present

## 2018-09-05 DIAGNOSIS — G518 Other disorders of facial nerve: Secondary | ICD-10-CM | POA: Diagnosis not present

## 2018-09-05 DIAGNOSIS — M791 Myalgia, unspecified site: Secondary | ICD-10-CM | POA: Diagnosis not present

## 2018-09-05 DIAGNOSIS — M542 Cervicalgia: Secondary | ICD-10-CM | POA: Diagnosis not present

## 2018-09-05 NOTE — Progress Notes (Deleted)
Referring Provider: [name], MD Primary Care Physician:  [name], MD Primary GI:  Dr. Gala Romney  No chief complaint on file.   HPI:   Tracey Daniel is a 38 y.o. female who presents for follow-up.  The patient was last seen in our office 06/30/2018 for cannabinoid hyperemesis syndrome, gastric outlet obstruction, pyloric spasms/stenosis.  She does have a history of pyloric spasms, cannabinoid hyperemesis syndrome, generalized abdominal pain which is chronic.  Previously recommended marijuana abstinence.  Chronic history of  GOO status post previous Botox injections.  Recent surgical referral recommendation for possible pyloroplasty.    Her last visit was a virtual office visit due to COVID-19/coronavirus pandemic.  Recent admission 05/31/2018 through 06/03/2026 at [facility] for abdominal pain, nausea, vomiting.  Chronic marijuana use potentially contributing.  CT of the abdomen and pelvis negative for acute issues.  Discharged on full liquid diet and recommended GI follow-up.  Gastroenterologist in the hospital unsure about possible role for surgical consult given normal-appearing pyloric valve.  EGD 06/01/2018 with esophagus status post biopsy, small hiatal hernia, erythematous mucosa in the cardia, gastric fundus, and gastric body.  Erosive gastropathy status post biopsy, normal pylorus which was dilated and injected with Botox.  Duodenal mucosa status post biopsy with pathology finding the duodenum, stomach, esophageal biopsies all essentially benign mucosa.  At her last visit she states her nausea and vomiting improved with dilation and Botox, still unable to eat large meals.  Consuming Ensure supplement twice a day.  Taking an antiemetic prior to eating to prevent nausea and vomiting.  No changes in bowel movements.  Intermittent/rare abdominal pain resolves after a bowel movement and stools are occasionally/rarely loose.  GERD well managed on PPI.  No other GI  complaints at this time.  Recommended she continue antiemetics, continue marijuana abstinence, surgical referral to discuss possible options, follow-up in 2 months.  Today she states   Past Medical History:  Diagnosis Date  . Anemia of other chronic disease 11/29/2012  . Asthma   . Back pain, chronic   . Chronic abdominal pain   . Constipation   . COPD (chronic obstructive pulmonary disease) ([place])   . Cyst of skin    mid spine area  . Depression 2000   h/o suicidal ideation  . Gastric outlet obstruction   . Gastritis   . Gastroparesis   . Migraine   . Migraines   . Nausea and vomiting    recurrent  . Nicotine addiction   . Osteoporosis   . Peptic ulcer disease   . Pyloric spasm 03/30/2011  . Seasonal allergies   . Sinusitis   . Substance abuse ([place]) 2008   marijuana  . Vitamin D deficiency     Past Surgical History:  Procedure Laterality Date  . BALLOON DILATION N/A 02/09/2013   Procedure: BALLOON DILATION;  Surgeon: Missy Sabins, MD;  Location: [facility] ENDOSCOPY;  Service: Endoscopy;  Laterality: N/A;  . BALLOON DILATION N/A 03/10/2018   Procedure: BALLOON DILATION;  Surgeon: Daneil Dolin, MD;  Location: [facility];  Service: Endoscopy;  Laterality: N/A;  . BILIARY DILATION  06/01/2018   Procedure: PYLORIC DILATION;  Surgeon: Rush Landmark Telford Nab., MD;  Location: [place];  Service: Gastroenterology;;  . BIOPSY  06/01/2018   Procedure: BIOPSY;  Surgeon: Irving Copas., MD;  Location: [facility] ENDOSCOPY;  Service: Gastroenterology;;  . Lum Keas INJECTION N/A 02/09/2013   Procedure: BOTOX INJECTION;  Surgeon: Missy Sabins, MD;  Location:  [facility] ENDOSCOPY;  Service: Endoscopy;  Laterality: N/A;  possible balloon  . BOTOX INJECTION N/A 10/24/2015   Procedure: BOTOX INJECTION;  Surgeon: Daneil Dolin, MD;  [facility]: [facility];  Service: Endoscopy;  Laterality: N/A;  . BOTOX INJECTION N/A 03/10/2018   Procedure: BOTOX INJECTION;  Surgeon: Daneil Dolin, MD;  [facility]: [facility];  Service: Endoscopy;  Laterality: N/A;  Melanie notified  . BOTOX INJECTION  06/01/2018   Procedure: BOTOX INJECTION;  Surgeon: Rush Landmark Telford Nab., MD;  Location: [place];  Service: Gastroenterology;;  . CARPAL TUNNEL RELEASE     left hand  . COLONOSCOPY WITH PROPOFOL N/A 09/20/2014   RMR: Normal ileo-colonoscopy  . ESOPHAGEAL DILATION  12/25/2010   Procedure: ESOPHAGEAL DILATION;  Surgeon: Dorothyann Peng, MD;  [facility];  Service: Endoscopy;;  . ESOPHAGOGASTRODUODENOSCOPY  12/25/2010   Dorothyann Peng, MD;  moderate gastritis, ?goo secondary to pylorspasm. BX showed reactive gstropathy no h.pyori, SB mucosa with intramucosal lymphocytosis and partial villous blunting (TTG 4.0 normal)  . ESOPHAGOGASTRODUODENOSCOPY N/A 11/14/2012   XHB:ZJIRC hiatal hernia. Abnormal gastric mucosa of  uncertain significance-status post biopsy. Subjectively, patient may have recurrent symptomatic, pylorospasm.  . ESOPHAGOGASTRODUODENOSCOPY (EGD) WITH PROPOFOL N/A 02/09/2013   elongated stomach, partial lower esophageal ring widely patent. No obvious pyloric stenosis s/p Botox  . ESOPHAGOGASTRODUODENOSCOPY (EGD) WITH PROPOFOL N/A 10/24/2015   Procedure: ESOPHAGOGASTRODUODENOSCOPY (EGD) WITH PROPOFOL;  Surgeon: Daneil Dolin, MD;  [facility];  Service: Endoscopy;  Laterality: N/A;  830   . ESOPHAGOGASTRODUODENOSCOPY (EGD) WITH PROPOFOL N/A 03/10/2018   Dr. Gala Romney: normal esophagus, elongated, dilated stomach likely stigmata of slow gastric emptying due to narrowing of pyloric channel, s/p balloon dilatation of pyloric channel. Small hiatal hernia, normal duodenal bulb and second portion of duodenum  . ESOPHAGOGASTRODUODENOSCOPY (EGD) WITH PROPOFOL N/A 06/01/2018   Procedure: ESOPHAGOGASTRODUODENOSCOPY (EGD) WITH PROPOFOL;  Surgeon: Rush Landmark Telford Nab., MD;  Location: [place];  Service: Gastroenterology;  Laterality: N/A;  . LAPAROSCOPIC CHOLECYSTECTOMY  2002   [place] [facility]?  [place]?  . laser surgery on cervix    . NASAL SEPTUM SURGERY  01/29/2017  . TOE SURGERY      Current Outpatient Medications  Medication Sig Dispense Refill  . acetaminophen (TYLENOL) 325 MG tablet Take 2 tablets (650 mg total) by mouth every 6 (six) hours as needed for mild pain (or Fever >/= 101). 20 tablet 0  . azelastine (ASTELIN) 0.1 % nasal spray USE 2 SPRAYS IN EACH NOSTRIL TWICE DAILY AS DIRECTED 30 mL 5  . diclofenac (VOLTAREN) 75 MG EC tablet TAKE 1 TABLET(75 MG) BY MOUTH TWICE DAILY 180 tablet 1  . estradiol (ESTRACE) 0.1 MG/GM vaginal cream INSERT 7.89 APPLICATORFUL VAGINALLY THREE TIMES WEEKLY AS DIRECTED 42.5 g 4  . estradiol (ESTRACE) 1 MG tablet Take 1 mg by mouth daily.    . feeding supplement (ENSURE CLINICAL STRENGTH) LIQD Take 237 mLs by mouth 3 (three) times daily with meals. 10 Bottle 3  . fluticasone (FLONASE) 50 MCG/ACT nasal spray SHAKE LIQUID AND USE 2 SPRAYS IN EACH NOSTRIL DAILY 48 g 5  . gabapentin (NEURONTIN) 100 MG capsule Take 1 capsule (100 mg total) by mouth 3 (three) times daily. 90 capsule 5  . hydroxychloroquine (PLAQUENIL) 200 MG tablet Take 200 mg by mouth daily.    Marland Kitchen linaclotide (LINZESS) 290 MCG CAPS capsule TAKE 1 CAPSULE(290 MCG) BY MOUTH DAILY 90 capsule 3  . metoCLOPramide (REGLAN) 10 MG tablet Take 1 tablet (  10 mg total) by mouth every 8 (eight) hours as needed for nausea. 20 tablet 0  . pantoprazole (PROTONIX) 40 MG tablet Take 1 tablet (40 mg total) by mouth 2 (two) times daily before a meal. 180 tablet 3  . potassium chloride SA (K-DUR) 20 MEQ tablet TAKE 1 TABLET BY MOUTH EVERY DAY 90 tablet 3  . pravastatin (PRAVACHOL) 20 MG tablet Take 1 tablet (20 mg total) by mouth daily. 90 tablet 3  . promethazine (PHENERGAN) 25 MG suppository Place 1 suppository (25 mg total) rectally every 6 (six) hours as needed for nausea or vomiting. 6 each 0  . propranolol (INDERAL) 10 MG tablet Take 1 tablet (10 mg total) by mouth 3 (three) times daily. 90  tablet 2  . risperiDONE (RISPERDAL) 0.5 MG tablet Take 0.5 mg at bedtime by mouth.    . sertraline (ZOLOFT) 50 MG tablet Take 50 mg by mouth at bedtime.     . sucralfate (CARAFATE) 1 g tablet One tablet every morning and at bedtime for stomach burning. 180 tablet 3  . tiZANidine (ZANAFLEX) 4 MG tablet TAKE 1 TABLET(4 MG) BY MOUTH BACK TWICE DAILY AS NEEDED FOR SPASMS 180 tablet 1  . zonisamide (ZONEGRAN) 50 MG capsule Take 150 mg by mouth at bedtime.   2   No current facility-administered medications for this visit.     Allergies as of 09/05/2018 - Review Complete 06/30/2018  Allergen Reaction Noted  . Benadryl [diphenhydramine] Anaphylaxis 03/25/2014  . Penicillins Other (See Comments)   . Latex Itching and Other (See Comments) 11/30/2011    Family History  Problem Relation Age of Onset  . Diabetes Father   . Liver disease Father        liver transplant at [facility], age 18  . Lung cancer Mother 60  . Heart disease Maternal Grandmother   . Parkinson's disease Maternal Grandfather   . Multiple sclerosis Sister 69  . Depression Sister 14  . Alcohol abuse Sister   . Bipolar disorder Sister   . Schizophrenia Sister   . Colon cancer Neg Hx     Social History   Socioeconomic History  . Marital status: Significant Other    Spouse name: Not on file  . Number of children: 0  . Years of education: 9  . Highest education level: 9th grade  Occupational History  . Occupation: disability    Employer: UNEMPLOYED  Social Needs  . Financial resource strain: Not very hard  . Food insecurity    Worry: Sometimes true    Inability: Sometimes true  . Transportation needs    Medical: No    Non-medical: No  Tobacco Use  . Smoking status: Current Every Day Smoker    Packs/day: 0.10    Years: 15.00    Pack years: 1.50    Types: Cigarettes  . Smokeless tobacco: Never Used  . Tobacco comment: 2 per day  Substance and Sexual Activity  . Alcohol use: No    Alcohol/week: 0.0 standard drinks   . Drug use: Not Currently    Types: Marijuana    Comment: As of 06/30/18: last use 2 months ago  . Sexual activity: Yes    Birth control/protection: None  Lifestyle  . Physical activity    Days per week: 3 days    Minutes per session: 30 min  . Stress: Not at all  Relationships  . Social connections    Talks on phone: More than three times a week    Gets  together: Twice a week    Attends religious service: Never    Active member of club or organization: No    Attends meetings of clubs or organizations: Never    Relationship status: Living with partner  Other Topics Concern  . Not on file  Social History Narrative  . Not on file    Review of Systems: General: Negative for anorexia, weight loss, fever, chills, fatigue, weakness. Eyes: Negative for vision changes.  ENT: Negative for hoarseness, difficulty swallowing , nasal congestion. CV: Negative for chest pain, angina, palpitations, dyspnea on exertion, peripheral edema.  Respiratory: Negative for dyspnea at rest, dyspnea on exertion, cough, sputum, wheezing.  GI: See history of present illness. GU:  Negative for dysuria, hematuria, urinary incontinence, urinary frequency, nocturnal urination.  MS: Negative for joint pain, low back pain.  Derm: Negative for rash or itching.  Neuro: Negative for weakness, abnormal sensation, seizure, frequent headaches, memory loss, confusion.  Psych: Negative for anxiety, depression, suicidal ideation, hallucinations.  Endo: Negative for unusual weight change.  Heme: Negative for bruising or bleeding. Allergy: Negative for rash or hives.   Physical Exam: LMP  (LMP Unknown) Comment: pt reports premature menopause General:   Alert and oriented. Pleasant and cooperative. Well-nourished and well-developed.  Head:  Normocephalic and atraumatic. Eyes:  Without icterus, sclera clear and conjunctiva pink.  Ears:  Normal auditory acuity. Mouth:  No deformity or lesions, oral mucosa pink.   Throat/Neck:  Supple, without mass or thyromegaly. Cardiovascular:  S1, S2 present without murmurs appreciated. Normal pulses noted. Extremities without clubbing or edema. Respiratory:  Clear to auscultation bilaterally. No wheezes, rales, or rhonchi. No distress.  Gastrointestinal:  +BS, soft, non-tender and non-distended. No HSM noted. No guarding or rebound. No masses appreciated.  Rectal:  Deferred  Musculoskalatal:  Symmetrical without gross deformities. Normal posture. Skin:  Intact without significant lesions or rashes. Neurologic:  Alert and oriented x4;  grossly normal neurologically. Psych:  Alert and cooperative. Normal mood and affect. Heme/Lymph/Immune: No significant cervical adenopathy. No excessive bruising noted.    09/05/2018 8:05 AM   Disclaimer: This note was dictated with voice recognition software. Similar sounding words can inadvertently be transcribed and may not be corrected upon review.

## 2018-09-07 ENCOUNTER — Telehealth: Payer: Self-pay | Admitting: Gastroenterology

## 2018-09-07 ENCOUNTER — Other Ambulatory Visit: Payer: Self-pay

## 2018-09-07 DIAGNOSIS — Z1159 Encounter for screening for other viral diseases: Secondary | ICD-10-CM

## 2018-09-07 DIAGNOSIS — R112 Nausea with vomiting, unspecified: Secondary | ICD-10-CM

## 2018-09-07 NOTE — Telephone Encounter (Signed)
Spoke with the patient. She agrees to an appointment for 10/03/18 with her COVID screening on 09/29/18. Information mailed to the patient.

## 2018-09-07 NOTE — Telephone Encounter (Signed)
Received referral from [facility] to schedule Manometry. Patient is a current patient of [facility]. Referral given to [facility], RN for review.

## 2018-09-08 ENCOUNTER — Ambulatory Visit (INDEPENDENT_AMBULATORY_CARE_PROVIDER_SITE_OTHER): Payer: Medicare HMO | Admitting: Family Medicine

## 2018-09-08 ENCOUNTER — Other Ambulatory Visit: Payer: Self-pay

## 2018-09-08 ENCOUNTER — Encounter: Payer: Self-pay | Admitting: Family Medicine

## 2018-09-08 ENCOUNTER — Other Ambulatory Visit: Payer: Self-pay | Admitting: Family Medicine

## 2018-09-08 VITALS — BP 100/72 | HR 61 | Temp 97.8°F | Resp 15 | Ht 66.0 in | Wt 138.0 lb

## 2018-09-08 DIAGNOSIS — Z720 Tobacco use: Secondary | ICD-10-CM | POA: Diagnosis not present

## 2018-09-08 DIAGNOSIS — J45991 Cough variant asthma: Secondary | ICD-10-CM | POA: Diagnosis not present

## 2018-09-08 DIAGNOSIS — Z23 Encounter for immunization: Secondary | ICD-10-CM

## 2018-09-08 DIAGNOSIS — F329 Major depressive disorder, single episode, unspecified: Secondary | ICD-10-CM | POA: Diagnosis not present

## 2018-09-08 DIAGNOSIS — E559 Vitamin D deficiency, unspecified: Secondary | ICD-10-CM

## 2018-09-08 DIAGNOSIS — F32A Depression, unspecified: Secondary | ICD-10-CM

## 2018-09-08 DIAGNOSIS — E785 Hyperlipidemia, unspecified: Secondary | ICD-10-CM

## 2018-09-08 DIAGNOSIS — R630 Anorexia: Secondary | ICD-10-CM | POA: Diagnosis not present

## 2018-09-08 MED ORDER — BUDESONIDE-FORMOTEROL FUMARATE 80-4.5 MCG/ACT IN AERO: 2.0000 | INHALATION_SPRAY | Freq: Two times a day (BID) | RESPIRATORY_TRACT | 3 refills | Status: DC

## 2018-09-08 MED ORDER — ALBUTEROL SULFATE HFA 108 (90 BASE) MCG/ACT IN AERS: 2.0000 | INHALATION_SPRAY | Freq: Four times a day (QID) | RESPIRATORY_TRACT | 1 refills | Status: DC | PRN

## 2018-09-08 MED ORDER — TIZANIDINE HCL 4 MG PO TABS: ORAL_TABLET | ORAL | 1 refills | Status: DC

## 2018-09-08 MED ORDER — MEGESTROL ACETATE 20 MG PO TABS: 20.0000 mg | ORAL_TABLET | Freq: Every day | ORAL | 3 refills | Status: DC

## 2018-09-08 NOTE — Assessment & Plan Note (Signed)
Controlled, no change in medication  

## 2018-09-08 NOTE — Patient Instructions (Addendum)
Keep wellness appointment in October  MD follow up in mid  Jan , call if you need me before  Fasting lipid, cmp and eGFr, TSH and vit D and  CBC in January 2021 Flu vaccine today  QUIT date for smoking is 09/19/2018, no cigarettes with patches  New fopr appetitie is daily megace  Sorry about mom in law, use support from hospice to help you also  Thanks for choosing [facility], we consider it a privelige to serve you.

## 2018-09-08 NOTE — Assessment & Plan Note (Signed)
Score of 11 today, increased stress with illness in family member, alredy involved with mental health

## 2018-09-08 NOTE — Assessment & Plan Note (Signed)
Megace x 4 months, 20 mg daily

## 2018-09-08 NOTE — Progress Notes (Signed)
   Tracey Daniel     MRN: [id number]      DOB: March 03, 1980   HPI Ms. He is here for follow up and re-evaluation of chronic medical conditions, medication management and review of any available recent lab and radiology data.  Preventive health is updated, specifically  Cancer screening and Immunization.   Questions or concerns regarding consultations or procedures which the PT has had in the interim are  addressed. The PT denies any adverse reactions to current medications since the last visit.  There are no new concerns.  There are no specific complaints   ROS Denies recent fever or chills. Denies sinus pressure, nasal congestion, ear pain or sore throat. Denies chest congestion, productive cough or wheezing. Denies chest pains, palpitations and leg swelling Denies abdominal pain, nausea, vomiting,diarrhea or constipation.   Denies dysuria, frequency, hesitancy or incontinence. Denies joint pain, swelling and limitation in mobility. Denies headaches, seizures, numbness, or tingling. Denies depression, anxiety or insomnia. Denies skin break down or rash.   PE  BP 100/72   Pulse 61   Temp 97.8 F (36.6 C) (Temporal)   Resp 15   Ht 5\' 6"  (1.676 m)   Wt 138 lb (62.6 kg)   LMP  (LMP Unknown) Comment: pt reports premature menopause  SpO2 100%   BMI 22.27 kg/m   Patient alert and oriented and in no cardiopulmonary distress.  HEENT: No facial asymmetry, EOMI,   oropharynx pink and moist.  Neck supple no JVD, no mass.  Chest: Clear to auscultation bilaterally.  CVS: S1, S2 no murmurs, no S3.Regular rate.  ABD: Soft non tender.   Ext: No edema  MS: Adequate ROM spine, shoulders, hips and knees.  Skin: Intact, no ulcerations or rash noted.  Psych: Good eye contact, normal affect. Memory intact not anxious or depressed appearing.  CNS: CN 2-12 intact, power,  normal throughout.no focal deficits noted.   Assessment & Plan Asthma, cough variant Controlled, no  change in medication   Depression Score of 11 today, increased stress with illness in family member, alredy involved with mental health  Tobacco abuse Asked:confirms currently smokes cigarettes 6/day Assess: Quit date for 09/19/2018 but cutting back Advise: needs to QUIT to reduce risk of cancer, cardio and cerebrovascular disease Assist: counseled for 5 minutes and literature provided gouing to use patches Arrange: follow up in  4 months   Poor appetite Megace x 4 months, 20 mg daily  Need for influenza vaccination After obtaining informed consent, the vaccine is  administered , with no adverse effect noted at the time of administration.

## 2018-09-08 NOTE — Assessment & Plan Note (Addendum)
Asked:confirms currently smokes cigarettes 6/day Assess: Quit date for 09/19/2018 but cutting back Advise: needs to QUIT to reduce risk of cancer, cardio and cerebrovascular disease Assist: counseled for 5 minutes and literature provided gouing to use patches Arrange: follow up in  4 months

## 2018-09-08 NOTE — Assessment & Plan Note (Signed)
After obtaining informed consent, the vaccine is  administered , with no adverse effect noted at the time of administration.  

## 2018-09-21 ENCOUNTER — Other Ambulatory Visit: Payer: Self-pay | Admitting: Family Medicine

## 2018-09-24 ENCOUNTER — Other Ambulatory Visit (HOSPITAL_COMMUNITY): Payer: Medicare HMO

## 2018-09-29 ENCOUNTER — Ambulatory Visit (INDEPENDENT_AMBULATORY_CARE_PROVIDER_SITE_OTHER): Payer: Medicare HMO | Admitting: Nurse Practitioner

## 2018-09-29 ENCOUNTER — Other Ambulatory Visit: Payer: Self-pay

## 2018-09-29 ENCOUNTER — Encounter: Payer: Self-pay | Admitting: Nurse Practitioner

## 2018-09-29 ENCOUNTER — Other Ambulatory Visit (HOSPITAL_COMMUNITY)
Admission: RE | Admit: 2018-09-29 | Discharge: 2018-09-29 | Disposition: A | Discharge status: Home / Self Care | Payer: Medicare HMO | Source: Ambulatory Visit | Attending: Gastroenterology | Admitting: Gastroenterology

## 2018-09-29 VITALS — BP 94/63 | HR 64 | Temp 96.8°F | Ht 66.0 in | Wt 141.2 lb

## 2018-09-29 DIAGNOSIS — K311 Adult hypertrophic pyloric stenosis: Secondary | ICD-10-CM

## 2018-09-29 DIAGNOSIS — K219 Gastro-esophageal reflux disease without esophagitis: Secondary | ICD-10-CM | POA: Diagnosis not present

## 2018-09-29 DIAGNOSIS — Z01812 Encounter for preprocedural laboratory examination: Secondary | ICD-10-CM | POA: Insufficient documentation

## 2018-09-29 DIAGNOSIS — R110 Nausea: Secondary | ICD-10-CM

## 2018-09-29 DIAGNOSIS — Z20828 Contact with and (suspected) exposure to other viral communicable diseases: Secondary | ICD-10-CM | POA: Insufficient documentation

## 2018-09-29 NOTE — Assessment & Plan Note (Signed)
History of gastric outlet obstruction status post multiple EGDs with Botox injection.  She was recently admitted to [facility] and referred to a surgeon for possible thyroplasty.  Surgery did a gastric emptying test which was normal, this is curious given a delayed emptying study in 2012.  They recommended esophageal manometry to check for other etiologies behind her symptoms.  Possible surgical intervention depending on test results.  Her manometry is scheduled for this coming week.  In the meantime, continue Ensure, tiny meals, preprandial antiemetic.  Follow-up in 3 months.

## 2018-09-29 NOTE — Progress Notes (Signed)
Referring Provider: [name], MD Primary Care Physician:  [name], MD Primary GI:  Dr. Gala Romney  Chief Complaint  Patient presents with  . Abdominal Pain    HPI:   Tracey Daniel is a 38 y.o. female who presents for follow-up.  Patient was last seen in our office 06/30/2018 for gastric outlet obstruction, pyloric spasms/stenosis, cannabinoid hyperemesis syndrome.  The last visit was a virtual office visit due to coronavirus/COVID-19 pandemic.  Recent hospital admission in May 2020 for abdominal pain with nausea and vomiting, noted chronic marijuana use potentially contributing.  CT imaging unremarkable.  Discharged on full liquid diet and follow-up with GI.  Gastroenterologist who saw her during her hospitalization sure of the role for possible surgical consult given normal-appearing pyloric valve.  He does have a history of EGDs with GOO and Botox injections as treatment.  Most recent EGD during her hospital stay outlined below.  EGD was recently completed 06/01/2018 which found esophagus status post biopsy, small hiatal hernia, erythematous mucosa in the cardia, gastric fundus, and gastric body.  Erosive gastropathy status post biopsy, normal pylorus which was dilated and injected with Botox.  Duodenal mucosa status post biopsy.  Surgical pathology found duodenum, stomach, esophageal biopsies all essentially benign mucosa.  At her last visit her nausea and vomiting was improved with dilation and Botox but still cannot eat much.  Using Ensure.  Taking antiemetic before eating to prevent nausea and vomiting.  6 pound weight loss in 3 weeks.  Intermittent/rare abdominal pain that resolves after a bowel movement.  GERD well managed on PPI.  No other GI complaints.  Recommended continue current medications, continued abstinence from cannabis products, surgical referral, follow-up in 2 months.  Call the patient and recommended gastric emptying study which was normal, compared  to previously completed which was delayed in 2012.  Recommended completion of esophageal manometry which is currently scheduled for next week.  After that is completed the recommended discussion for options with the most aggressive option being fundoplication pyloroplasty although they would like to rule out other etiologies first.  Today she states she';s doing ok overall. Just buried her mother-in-law in [place], [place]. Still having abdominal pain which is now more constant than intermittent; worse with food. Muscle relaxer hasn't helped. Appetite poor, was Rx'd Megace by Dr. Moshe Cipro which has helped her appetite some. Can't eat much due to pain. Still using Ensure 2-3 times a day; sips on it. Early sateity continues. Abdominal pain is periumbilical and radiates to her back, squeezing pain. Starts every morning. Denies N/V if she uses pre-prandial antiemetic. Denies fever, chills. Objectively she is down 8 lbs since 03/07/18 (last OV). Has put on a couple pounds in the past week or two. Denies hematochezia, melena, GERD symptoms well managed on Protonix bid. Having more regular bowel movements on Linzess; dose adjusts based on response. Has some flatuance and has difficulty passing this at times. Denies URI or flu-like symptoms. Denies loss of sense of taste or smell. Denies chest pain, dyspnea, dizziness, lightheadedness, syncope, near syncope. Denies any other upper or lower GI symptoms.  Last Marijuana use was 4 weeks ago.  Past Medical History:  Diagnosis Date  . Anemia of other chronic disease 11/29/2012  . Asthma   . Back pain, chronic   . Chronic abdominal pain   . Constipation   . COPD (chronic obstructive pulmonary disease) ([place])   . Cyst of skin    mid spine area  . Depression 2000  h/o suicidal ideation  . Gastric outlet obstruction   . Gastritis   . Gastroparesis   . Migraine   . Migraines   . Nausea and vomiting    recurrent  . Nicotine addiction   . Osteoporosis   . Peptic ulcer  disease   . Pyloric spasm 03/30/2011  . Seasonal allergies   . Sinusitis   . Substance abuse ([place]) 2008   marijuana  . Vitamin D deficiency     Past Surgical History:  Procedure Laterality Date  . BALLOON DILATION N/A 02/09/2013   Procedure: BALLOON DILATION;  Surgeon: Missy Sabins, MD;  Location: [facility];  Service: Endoscopy;  Laterality: N/A;  . BALLOON DILATION N/A 03/10/2018   Procedure: BALLOON DILATION;  Surgeon: Daneil Dolin, MD;  [facility];  Service: Endoscopy;  Laterality: N/A;  . BILIARY DILATION  06/01/2018   Procedure: PYLORIC DILATION;  Surgeon: Rush Landmark Telford Nab., MD;  Location: [facility];  Service: Gastroenterology;;  . BIOPSY  06/01/2018   Procedure: BIOPSY;  Surgeon: Irving Copas., MD;  Location: [facility];  Service: Gastroenterology;;  . Lum Keas INJECTION N/A 02/09/2013   Procedure: BOTOX INJECTION;  Surgeon: Missy Sabins, MD;  Location: [facility];  Service: Endoscopy;  Laterality: N/A;  possible balloon  . BOTOX INJECTION N/A 10/24/2015   Procedure: BOTOX INJECTION;  Surgeon: Daneil Dolin, MD;  [facility];  Service: Endoscopy;  Laterality: N/A;  . BOTOX INJECTION N/A 03/10/2018   Procedure: BOTOX INJECTION;  Surgeon: Daneil Dolin, MD;  [facility];  Service: Endoscopy;  Laterality: N/A;  Melanie notified  . BOTOX INJECTION  06/01/2018   Procedure: BOTOX INJECTION;  Surgeon: Rush Landmark Telford Nab., MD;  Location: [place];  Service: Gastroenterology;;  . CARPAL TUNNEL RELEASE     left hand  . COLONOSCOPY WITH PROPOFOL N/A 09/20/2014   RMR: Normal ileo-colonoscopy  . ESOPHAGEAL DILATION  12/25/2010   Procedure: ESOPHAGEAL DILATION;  Surgeon: Dorothyann Peng, MD;  [facility];  Service: Endoscopy;;  . ESOPHAGOGASTRODUODENOSCOPY  12/25/2010   Dorothyann Peng, MD;  moderate gastritis, ?goo secondary to pylorspasm. BX showed reactive gstropathy no h.pyori, SB mucosa with intramucosal  lymphocytosis and partial villous blunting (TTG 4.0 normal)  . ESOPHAGOGASTRODUODENOSCOPY N/A 11/14/2012   [id number] hiatal hernia. Abnormal gastric mucosa of  uncertain significance-status post biopsy. Subjectively, patient may have recurrent symptomatic, pylorospasm.  . ESOPHAGOGASTRODUODENOSCOPY (EGD) WITH PROPOFOL N/A 02/09/2013   elongated stomach, partial lower esophageal ring widely patent. No obvious pyloric stenosis s/p Botox  . ESOPHAGOGASTRODUODENOSCOPY (EGD) WITH PROPOFOL N/A 10/24/2015   Procedure: ESOPHAGOGASTRODUODENOSCOPY (EGD) WITH PROPOFOL;  Surgeon: Daneil Dolin, MD;  [facility];  Service: Endoscopy;  Laterality: N/A;  830   . ESOPHAGOGASTRODUODENOSCOPY (EGD) WITH PROPOFOL N/A 03/10/2018   Dr. Gala Romney: normal esophagus, elongated, dilated stomach likely stigmata of slow gastric emptying due to narrowing of pyloric channel, s/p balloon dilatation of pyloric channel. Small hiatal hernia, normal duodenal bulb and second portion of duodenum  . ESOPHAGOGASTRODUODENOSCOPY (EGD) WITH PROPOFOL N/A 06/01/2018   Procedure: ESOPHAGOGASTRODUODENOSCOPY (EGD) WITH PROPOFOL;  Surgeon: Rush Landmark Telford Nab., MD;  Location: [place];  Service: Gastroenterology;  Laterality: N/A;  . LAPAROSCOPIC CHOLECYSTECTOMY  2002   [facility]?  [place]?  . laser surgery on cervix    . NASAL SEPTUM SURGERY  01/29/2017  . TOE SURGERY      Current Outpatient Medications  Medication Sig Dispense Refill  . acetaminophen (TYLENOL) 325 MG  tablet Take 2 tablets (650 mg total) by mouth every 6 (six) hours as needed for mild pain (or Fever >/= 101). 20 tablet 0  . azelastine (ASTELIN) 0.1 % nasal spray USE 2 SPRAYS IN EACH NOSTRIL TWICE DAILY AS DIRECTED 30 mL 5  . budesonide-formoterol (SYMBICORT) 80-4.5 MCG/ACT inhaler Inhale 2 puffs into the lungs 2 (two) times daily. 1 Inhaler 3  . diclofenac (VOLTAREN) 75 MG EC tablet TAKE 1 TABLET(75 MG) BY MOUTH TWICE DAILY 180 tablet 1  . estradiol  (ESTRACE) 0.1 MG/GM vaginal cream INSERT [id number] APPLICATORFUL VAGINALLY THREE TIMES WEEKLY AS DIRECTED 42.5 g 4  . estradiol (ESTRACE) 1 MG tablet Take 1 mg by mouth daily.    . feeding supplement (ENSURE CLINICAL STRENGTH) LIQD Take 237 mLs by mouth 3 (three) times daily with meals. (Patient taking differently: Take 237 mLs by mouth 2 (two) times daily. ) 10 Bottle 3  . fluticasone (FLONASE) 50 MCG/ACT nasal spray SHAKE LIQUID AND USE 2 SPRAYS IN EACH NOSTRIL DAILY 48 g 5  . gabapentin (NEURONTIN) 100 MG capsule Take 1 capsule (100 mg total) by mouth 3 (three) times daily. 90 capsule 5  . hydroxychloroquine (PLAQUENIL) 200 MG tablet Take 200 mg by mouth daily.    Marland Kitchen linaclotide (LINZESS) 290 MCG CAPS capsule TAKE 1 CAPSULE(290 MCG) BY MOUTH DAILY 90 capsule 3  . megestrol (MEGACE) 20 MG tablet Take 1 tablet (20 mg total) by mouth daily. 30 tablet 3  . metoCLOPramide (REGLAN) 10 MG tablet Take 1 tablet (10 mg total) by mouth every 8 (eight) hours as needed for nausea. 20 tablet 0  . pantoprazole (PROTONIX) 40 MG tablet Take 1 tablet (40 mg total) by mouth 2 (two) times daily before a meal. 180 tablet 3  . potassium chloride SA (K-DUR) 20 MEQ tablet TAKE 1 TABLET BY MOUTH EVERY DAY 90 tablet 3  . pravastatin (PRAVACHOL) 20 MG tablet Take 1 tablet (20 mg total) by mouth daily. 90 tablet 3  . promethazine (PHENERGAN) 25 MG suppository Place 1 suppository (25 mg total) rectally every 6 (six) hours as needed for nausea or vomiting. 6 each 0  . propranolol (INDERAL) 10 MG tablet Take 1 tablet (10 mg total) by mouth 3 (three) times daily. 90 tablet 2  . risperiDONE (RISPERDAL) 0.5 MG tablet Take 0.5 mg at bedtime by mouth.    . sertraline (ZOLOFT) 50 MG tablet Take 50 mg by mouth at bedtime.     . sucralfate (CARAFATE) 1 g tablet One tablet every morning and at bedtime for stomach burning. 180 tablet 3  . tiZANidine (ZANAFLEX) 4 MG tablet TAKE 1 TABLET(4 MG) BY MOUTH BACK TWICE DAILY AS NEEDED FOR SPASMS 180  tablet 1  . VENTOLIN HFA 108 (90 Base) MCG/ACT inhaler INHALE 2 PUFFS INTO THE LUNGS EVERY 6 HOURS AS NEEDED FOR SHORTNESS OF BREATH 18 g 0  . zonisamide (ZONEGRAN) 50 MG capsule Take 150 mg by mouth at bedtime.   2   No current facility-administered medications for this visit.     Allergies as of 09/29/2018 - Review Complete 09/29/2018  Allergen Reaction Noted  . Benadryl [diphenhydramine] Anaphylaxis 03/25/2014  . Penicillins Other (See Comments)   . Latex Itching and Other (See Comments) 11/30/2011    Family History  Problem Relation Age of Onset  . Diabetes Father   . Liver disease Father        liver transplant at [facility], age 61  . Lung cancer Mother 24  . Heart  disease Maternal Grandmother   . Parkinson's disease Maternal Grandfather   . Multiple sclerosis Sister 58  . Depression Sister 40  . Alcohol abuse Sister   . Bipolar disorder Sister   . Schizophrenia Sister   . Colon cancer Neg Hx     Social History   Socioeconomic History  . Marital status: Significant Other    Spouse name: Not on file  . Number of children: 0  . Years of education: 9  . Highest education level: 9th grade  Occupational History  . Occupation: disability    Employer: UNEMPLOYED  Social Needs  . Financial resource strain: Not very hard  . Food insecurity    Worry: Sometimes true    Inability: Sometimes true  . Transportation needs    Medical: No    Non-medical: No  Tobacco Use  . Smoking status: Current Every Day Smoker    Packs/day: 0.10    Years: 15.00    Pack years: 1.50    Types: Cigarettes  . Smokeless tobacco: Never Used  . Tobacco comment: 2 per day  Substance and Sexual Activity  . Alcohol use: No    Alcohol/week: 0.0 standard drinks  . Drug use: Yes    Types: Marijuana    Comment: 09/29/18-last used approx 4 weeks ago  . Sexual activity: Yes    Birth control/protection: None  Lifestyle  . Physical activity    Days per week: 3 days    Minutes per session: 30 min  .  Stress: Not at all  Relationships  . Social Herbalist on phone: More than three times a week    Gets together: Twice a week    Attends religious service: Never    Active member of club or organization: No    Attends meetings of clubs or organizations: Never    Relationship status: Living with partner  Other Topics Concern  . Not on file  Social History Narrative  . Not on file    Review of Systems: General: Negative for anorexia, weight loss, fever, chills, fatigue, weakness. ENT: Negative for hoarseness, difficulty swallowing. CV: Negative for chest pain, angina, palpitations, peripheral edema.  Respiratory: Negative for dyspnea at rest, cough, sputum, wheezing.  GI: See history of present illness. Endo: Negative for unusual weight change.  Heme: Negative for bruising or bleeding. Allergy: Negative for rash or hives.   Physical Exam: BP 94/63   Pulse 64   Temp (!) 96.8 F (36 C) (Temporal)   Ht 5\' 6"  (1.676 m)   Wt 141 lb 3.2 oz (64 kg)   LMP  (LMP Unknown) Comment: pt reports premature menopause  BMI 22.79 kg/m  General:   Alert and oriented. Pleasant and cooperative. Well-nourished and well-developed.  Eyes:  Without icterus, sclera clear and conjunctiva pink.  Ears:  Normal auditory acuity. Cardiovascular:  S1, S2 present without murmurs appreciated.  Extremities without clubbing or edema. Respiratory:  Clear to auscultation bilaterally. No wheezes, rales, or rhonchi. No distress.  Gastrointestinal:  +BS, soft, and non-distended. Mild TTP mid-abdomen. No HSM noted. No guarding or rebound. No masses appreciated.  Rectal:  Deferred  Musculoskalatal:  Symmetrical without gross deformities. Normal posture.  Neurologic:  Alert and oriented x4;  grossly normal neurologically. Psych:  Alert and cooperative. Normal mood and affect. Heme/Lymph/Immune: No excessive bruising noted.    09/29/2018 8:29 AM   Disclaimer: This note was dictated with voice  recognition software. Similar sounding words can inadvertently be transcribed and may  not be corrected upon review.

## 2018-09-29 NOTE — Patient Instructions (Signed)
Your health issues we discussed today were:   Nausea, abdominal pain, "gastric outlet obstruction": 1. Continue to eat small, soft meals. 2. Continue using Ensure supplements to help get adequate nutrition 3. Continue to abstain from marijuana to prevent "cannabinoid hyperemesis" that we talked about 4. Continue taking your nausea medicine before eating to help prevent nausea and vomiting 5. Complete the tests that were ordered by GI and by the surgeon in [place] 6. Further recommendations will follow 7. Call us if you have any severe or worsening symptoms  Overall I recommend:  1. Continue your other current medications 2. Follow-up in 3 months 3. Call us if you have any questions or concerns.   Because of recent events of COVID-19 ("Coronavirus"), follow CDC recommendations:  1. Wash your hand frequently 2. Avoid touching your face 3. Stay away from people who are sick 4. If you have symptoms such as fever, cough, shortness of breath then call your healthcare provider for further guidance 5. If you are sick, STAY AT HOME unless otherwise directed by your healthcare provider. 6. Follow directions from state and national officials regarding staying safe   At [facility] we value your feedback. You may receive a survey about your visit today. Please share your experience as we strive to create trusting relationships with our patients to provide genuine, compassionate, quality care.  We appreciate your understanding and patience as we review any laboratory studies, imaging, and other diagnostic tests that are ordered as we care for you. Our office policy is 5 business days for review of these results, and any emergent or urgent results are addressed in a timely manner for your best interest. If you do not hear from our office in 1 week, please contact us.   We also encourage the use of MyChart, which contains your medical information for your review as well. If you are  not enrolled in this feature, an access code is on this after visit summary for your convenience. Thank you for allowing [place] to be involved in your care.  It was great to see you today!  I hope you have a great Fall!!

## 2018-09-29 NOTE — Assessment & Plan Note (Signed)
Currently well managed on PPI and doubtful contributing to her symptoms.  Continue current medications and follow-up in 3 months.

## 2018-09-29 NOTE — Assessment & Plan Note (Signed)
Persistent postprandial nausea that is generally well managed by an antiemetic.  No vomiting at this time.  Recommend she continue small meals, Ensure supplements, abstain from marijuana, testing as ordered by surgery and GI, preprandial antiemetic to help prevent nausea and vomiting postprandially.  Follow-up in 3 months.

## 2018-09-30 LAB — NOVEL CORONAVIRUS, NAA (HOSP ORDER, SEND-OUT TO REF LAB; TAT 18-24 HRS): SARS-CoV-2, NAA: NOT DETECTED

## 2018-10-02 ENCOUNTER — Other Ambulatory Visit: Payer: Self-pay | Admitting: Family Medicine

## 2018-10-03 ENCOUNTER — Ambulatory Visit (HOSPITAL_COMMUNITY)
Admission: RE | Admit: 2018-10-03 | Discharge: 2018-10-03 | Disposition: A | Discharge status: Home / Self Care | Payer: Medicare HMO | Attending: Gastroenterology | Admitting: Gastroenterology

## 2018-10-03 ENCOUNTER — Encounter (HOSPITAL_COMMUNITY)
Admission: RE | Disposition: A | Discharge status: Home / Self Care | Payer: Self-pay | Source: Home / Self Care | Attending: Gastroenterology

## 2018-10-03 DIAGNOSIS — K219 Gastro-esophageal reflux disease without esophagitis: Secondary | ICD-10-CM | POA: Diagnosis not present

## 2018-10-03 DIAGNOSIS — R112 Nausea with vomiting, unspecified: Secondary | ICD-10-CM | POA: Insufficient documentation

## 2018-10-03 DIAGNOSIS — Z20828 Contact with and (suspected) exposure to other viral communicable diseases: Secondary | ICD-10-CM | POA: Insufficient documentation

## 2018-10-03 DIAGNOSIS — R1013 Epigastric pain: Secondary | ICD-10-CM

## 2018-10-03 DIAGNOSIS — R109 Unspecified abdominal pain: Secondary | ICD-10-CM | POA: Diagnosis not present

## 2018-10-03 HISTORY — PX: ESOPHAGEAL MANOMETRY: SHX5429

## 2018-10-03 SURGERY — MANOMETRY, ESOPHAGUS

## 2018-10-03 MED ORDER — LIDOCAINE VISCOUS HCL 2 % MT SOLN
OROMUCOSAL | Status: AC
  Filled 2018-10-03: qty 15

## 2018-10-03 SURGICAL SUPPLY — 2 items
FACESHIELD LNG OPTICON STERILE (SAFETY) IMPLANT
GLOVE BIO SURGEON STRL SZ8 (GLOVE) ×6 IMPLANT

## 2018-10-03 NOTE — Progress Notes (Signed)
Esophageal manometry performed per protocol.  Patient unable to tolerated at 30 degrees after 6 swallows.  Sat patient to 90 degrees and patient vomitted 300 cc clear liquid.  Elevated hob to approx 60 degrees for remainder of study.  Patient vomited again during rapid swallow portions.  Patient otherwise tolerated study without further complications.

## 2018-10-04 ENCOUNTER — Encounter (HOSPITAL_COMMUNITY): Payer: Self-pay | Admitting: Gastroenterology

## 2018-10-10 DIAGNOSIS — G43719 Chronic migraine without aura, intractable, without status migrainosus: Secondary | ICD-10-CM | POA: Diagnosis not present

## 2018-10-10 DIAGNOSIS — G518 Other disorders of facial nerve: Secondary | ICD-10-CM | POA: Diagnosis not present

## 2018-10-10 DIAGNOSIS — M791 Myalgia, unspecified site: Secondary | ICD-10-CM | POA: Diagnosis not present

## 2018-10-10 DIAGNOSIS — M542 Cervicalgia: Secondary | ICD-10-CM | POA: Diagnosis not present

## 2018-10-16 ENCOUNTER — Other Ambulatory Visit: Payer: Self-pay | Admitting: Obstetrics and Gynecology

## 2018-10-17 ENCOUNTER — Other Ambulatory Visit: Payer: Self-pay | Admitting: Obstetrics and Gynecology

## 2018-10-20 ENCOUNTER — Other Ambulatory Visit: Payer: Self-pay

## 2018-10-20 ENCOUNTER — Other Ambulatory Visit: Payer: Self-pay | Admitting: Family Medicine

## 2018-10-20 DIAGNOSIS — K3184 Gastroparesis: Secondary | ICD-10-CM

## 2018-10-20 MED ORDER — METOCLOPRAMIDE HCL 10 MG PO TABS: 10.0000 mg | ORAL_TABLET | Freq: Three times a day (TID) | ORAL | 0 refills | Status: DC | PRN

## 2018-11-01 DIAGNOSIS — L920 Granuloma annulare: Secondary | ICD-10-CM | POA: Diagnosis not present

## 2018-11-10 ENCOUNTER — Ambulatory Visit: Payer: Medicare HMO

## 2018-11-11 ENCOUNTER — Other Ambulatory Visit: Payer: Self-pay

## 2018-11-11 ENCOUNTER — Ambulatory Visit (INDEPENDENT_AMBULATORY_CARE_PROVIDER_SITE_OTHER): Payer: Medicare HMO | Admitting: Family Medicine

## 2018-11-11 ENCOUNTER — Encounter: Payer: Self-pay | Admitting: Family Medicine

## 2018-11-11 VITALS — BP 96/62 | HR 89 | Ht 66.0 in | Wt 141.0 lb

## 2018-11-11 DIAGNOSIS — Z0000 Encounter for general adult medical examination without abnormal findings: Secondary | ICD-10-CM | POA: Diagnosis not present

## 2018-11-11 DIAGNOSIS — E559 Vitamin D deficiency, unspecified: Secondary | ICD-10-CM | POA: Diagnosis not present

## 2018-11-11 DIAGNOSIS — J45991 Cough variant asthma: Secondary | ICD-10-CM | POA: Diagnosis not present

## 2018-11-11 DIAGNOSIS — E785 Hyperlipidemia, unspecified: Secondary | ICD-10-CM | POA: Diagnosis not present

## 2018-11-11 DIAGNOSIS — F329 Major depressive disorder, single episode, unspecified: Secondary | ICD-10-CM | POA: Diagnosis not present

## 2018-11-11 DIAGNOSIS — K3184 Gastroparesis: Secondary | ICD-10-CM | POA: Diagnosis not present

## 2018-11-11 LAB — CBC
HCT: 33.5 % — ABNORMAL LOW (ref 35.0–45.0)
Hemoglobin: 11.1 g/dL — ABNORMAL LOW (ref 11.7–15.5)
MCH: 30.1 pg (ref 27.0–33.0)
MCHC: 33.1 g/dL (ref 32.0–36.0)
MCV: 90.8 fL (ref 80.0–100.0)
MPV: 12 fL (ref 7.5–12.5)
Platelets: 192 10*3/uL (ref 140–400)
RBC: 3.69 10*6/uL — ABNORMAL LOW (ref 3.80–5.10)
RDW: 12.1 % (ref 11.0–15.0)
WBC: 8.7 10*3/uL (ref 3.8–10.8)

## 2018-11-11 LAB — TSH: TSH: 1.55 mIU/L

## 2018-11-11 LAB — LIPID PANEL
Cholesterol: 151 mg/dL (ref <200)
HDL: 42 mg/dL — ABNORMAL LOW (ref >=50)
LDL Cholesterol (Calc): 90 mg/dL (calc)
Non-HDL Cholesterol (Calc): 109 mg/dL (calc) (ref <130)
Total CHOL/HDL Ratio: 3.6 (calc) (ref <5.0)
Triglycerides: 93 mg/dL (ref <150)

## 2018-11-11 LAB — COMPLETE METABOLIC PANEL WITH GFR
AG Ratio: 1.6 (calc) (ref 1.0–2.5)
ALT: 29 U/L (ref 6–29)
AST: 31 U/L — ABNORMAL HIGH (ref 10–30)
Albumin: 4.4 g/dL (ref 3.6–5.1)
Alkaline phosphatase (APISO): 65 U/L (ref 31–125)
BUN: 25 mg/dL (ref 7–25)
CO2: 25 mmol/L (ref 20–32)
Calcium: 9.3 mg/dL (ref 8.6–10.2)
Chloride: 109 mmol/L (ref 98–110)
Creat: 0.75 mg/dL (ref 0.50–1.10)
GFR, Est African American: 116 mL/min/{1.73_m2} (ref >=60)
GFR, Est Non African American: 100 mL/min/{1.73_m2} (ref >=60)
Globulin: 2.7 g/dL (calc) (ref 1.9–3.7)
Glucose, Bld: 91 mg/dL (ref 65–99)
Potassium: 4.1 mmol/L (ref 3.5–5.3)
Sodium: 141 mmol/L (ref 135–146)
Total Bilirubin: 0.3 mg/dL (ref 0.2–1.2)
Total Protein: 7.1 g/dL (ref 6.1–8.1)

## 2018-11-11 LAB — VITAMIN D 25 HYDROXY (VIT D DEFICIENCY, FRACTURES): Vit D, 25-Hydroxy: 28 ng/mL — ABNORMAL LOW (ref 30–100)

## 2018-11-11 MED ORDER — METOCLOPRAMIDE HCL 10 MG PO TABS: 10.0000 mg | ORAL_TABLET | Freq: Three times a day (TID) | ORAL | 0 refills | Status: DC | PRN

## 2018-11-11 NOTE — Progress Notes (Signed)
Subjective:   Tracey Daniel is a 38 y.o. female who presents for Medicare Annual (Subsequent) preventive examination.  Location of Patient: Home Location of Provider: Telehealth Consent was obtain for visit to be over via telehealth.  I verified that I am speaking with the correct person using two identifiers.   Review of Systems:   Cardiac Risk Factors include: sedentary lifestyle;smoking/ tobacco exposure     Objective:     Vitals: BP 96/62   Pulse 89   Ht 5\' 6"  (1.676 m)   Wt 141 lb (64 kg)   LMP  (LMP Unknown) Comment: pt reports premature menopause  BMI 22.76 kg/m   Body mass index is 22.76 kg/m.  Advanced Directives 11/11/2018 06/01/2018 05/31/2018 05/31/2018 05/29/2018 05/28/2018 03/10/2018  Does Patient Have a Medical Advance Directive? No No No No No No No  Would patient like information on creating a medical advance directive? - - No - Patient declined - No - Patient declined No - Patient declined No - Patient declined  Pre-existing out of facility DNR order (yellow form or pink MOST form) - - - - - - -    Tobacco Social History   Tobacco Use  Smoking Status Current Every Day Smoker  . Packs/day: 0.10  . Years: 15.00  . Pack years: 1.50  . Types: Cigarettes  Smokeless Tobacco Never Used  Tobacco Comment   2 per day     Ready to quit: Not Answered Counseling given: Not Answered Comment: 2 per day   Clinical Intake:  Pre-visit preparation completed: No  Pain : No/denies pain     Nutritional Status: BMI of 19-24  Normal Diabetes: No  How often do you need to have someone help you when you read instructions, pamphlets, or other written materials from your doctor or pharmacy?: 1 - Never What is the last grade level you completed in school?: 9th  Interpreter Needed?: No     Past Medical History:  Diagnosis Date  . Anemia of other chronic disease 11/29/2012  . Asthma   . Back pain, chronic   . Chronic abdominal pain   . Constipation    . COPD (chronic obstructive pulmonary disease) ([place])   . Cyst of skin    mid spine area  . Depression 2000   h/o suicidal ideation  . Gastric outlet obstruction   . Gastritis   . Gastroparesis   . Migraine   . Migraines   . Nausea and vomiting    recurrent  . Nicotine addiction   . Osteoporosis   . Peptic ulcer disease   . Pyloric spasm 03/30/2011  . Seasonal allergies   . Sinusitis   . Substance abuse ([place]) 2008   marijuana  . Vitamin D deficiency    Past Surgical History:  Procedure Laterality Date  . BALLOON DILATION N/A 02/09/2013   Procedure: BALLOON DILATION;  Surgeon: Missy Sabins, MD;  Location: [facility];  Service: Endoscopy;  Laterality: N/A;  . BALLOON DILATION N/A 03/10/2018   Procedure: BALLOON DILATION;  Surgeon: Daneil Dolin, MD;  [facility];  Service: Endoscopy;  Laterality: N/A;  . BILIARY DILATION  06/01/2018   Procedure: PYLORIC DILATION;  Surgeon: Rush Landmark Telford Nab., MD;  Location: [place];  Service: Gastroenterology;;  . BIOPSY  06/01/2018   Procedure: BIOPSY;  Surgeon: Irving Copas., MD;  Location: [facility];  Service: Gastroenterology;;  . Lum Keas INJECTION N/A 02/09/2013   Procedure: BOTOX INJECTION;  Surgeon: Missy Sabins,  MD;  Location: [facility];  Service: Endoscopy;  Laterality: N/A;  possible balloon  . BOTOX INJECTION N/A 10/24/2015   Procedure: BOTOX INJECTION;  Surgeon: Daneil Dolin, MD;  [facility];  Service: Endoscopy;  Laterality: N/A;  . BOTOX INJECTION N/A 03/10/2018   Procedure: BOTOX INJECTION;  Surgeon: Daneil Dolin, MD;  [facility];  Service: Endoscopy;  Laterality: N/A;  Melanie notified  . BOTOX INJECTION  06/01/2018   Procedure: BOTOX INJECTION;  Surgeon: Rush Landmark Telford Nab., MD;  Location: [place];  Service: Gastroenterology;;  . CARPAL TUNNEL RELEASE     left hand  . COLONOSCOPY WITH PROPOFOL N/A 09/20/2014   RMR: Normal ileo-colonoscopy  . ESOPHAGEAL  DILATION  12/25/2010   Procedure: ESOPHAGEAL DILATION;  Surgeon: Dorothyann Peng, MD;  [facility];  Service: Endoscopy;;  . ESOPHAGEAL MANOMETRY N/A 10/03/2018   Procedure: ESOPHAGEAL MANOMETRY (EM);  Surgeon: Mauri Pole, MD;  Location: [facility];  Service: Endoscopy;  Laterality: N/A;  . ESOPHAGOGASTRODUODENOSCOPY  12/25/2010   Dorothyann Peng, MD;  moderate gastritis, ?goo secondary to pylorspasm. BX showed reactive gstropathy no h.pyori, SB mucosa with intramucosal lymphocytosis and partial villous blunting (TTG 4.0 normal)  . ESOPHAGOGASTRODUODENOSCOPY N/A 11/14/2012   [id number] hiatal hernia. Abnormal gastric mucosa of  uncertain significance-status post biopsy. Subjectively, patient may have recurrent symptomatic, pylorospasm.  . ESOPHAGOGASTRODUODENOSCOPY (EGD) WITH PROPOFOL N/A 02/09/2013   elongated stomach, partial lower esophageal ring widely patent. No obvious pyloric stenosis s/p Botox  . ESOPHAGOGASTRODUODENOSCOPY (EGD) WITH PROPOFOL N/A 10/24/2015   Procedure: ESOPHAGOGASTRODUODENOSCOPY (EGD) WITH PROPOFOL;  Surgeon: Daneil Dolin, MD;  [facility];  Service: Endoscopy;  Laterality: N/A;  830   . ESOPHAGOGASTRODUODENOSCOPY (EGD) WITH PROPOFOL N/A 03/10/2018   Dr. Gala Romney: normal esophagus, elongated, dilated stomach likely stigmata of slow gastric emptying due to narrowing of pyloric channel, s/p balloon dilatation of pyloric channel. Small hiatal hernia, normal duodenal bulb and second portion of duodenum  . ESOPHAGOGASTRODUODENOSCOPY (EGD) WITH PROPOFOL N/A 06/01/2018   Procedure: ESOPHAGOGASTRODUODENOSCOPY (EGD) WITH PROPOFOL;  Surgeon: Rush Landmark Telford Nab., MD;  Location: [place];  Service: Gastroenterology;  Laterality: N/A;  . LAPAROSCOPIC CHOLECYSTECTOMY  2002   [place] Na?  [place]?  . laser surgery on cervix    . NASAL SEPTUM SURGERY  01/29/2017  . TOE SURGERY     Family History  Problem Relation Age of Onset  . Diabetes Father   .  Liver disease Father        liver transplant at [facility], age 36  . Lung cancer Mother 28  . Heart disease Maternal Grandmother   . Parkinson's disease Maternal Grandfather   . Multiple sclerosis Sister 38  . Depression Sister 9  . Alcohol abuse Sister   . Bipolar disorder Sister   . Schizophrenia Sister   . Colon cancer Neg Hx    Social History   Socioeconomic History  . Marital status: Significant Other    Spouse name: Not on file  . Number of children: 0  . Years of education: 9  . Highest education level: 9th grade  Occupational History  . Occupation: disability    Employer: UNEMPLOYED  Social Needs  . Financial resource strain: Not very hard  . Food insecurity    Worry: Sometimes true    Inability: Sometimes true  . Transportation needs    Medical: No    Non-medical: No  Tobacco Use  . Smoking status: Current Every Day Smoker  Packs/day: 0.10    Years: 15.00    Pack years: 1.50    Types: Cigarettes  . Smokeless tobacco: Never Used  . Tobacco comment: 2 per day  Substance and Sexual Activity  . Alcohol use: No    Alcohol/week: 0.0 standard drinks  . Drug use: Yes    Types: Marijuana    Comment: 09/29/18-last used approx 4 weeks ago  . Sexual activity: Yes    Birth control/protection: None  Lifestyle  . Physical activity    Days per week: 3 days    Minutes per session: 30 min  . Stress: Not at all  Relationships  . Social Herbalist on phone: More than three times a week    Gets together: Twice a week    Attends religious service: Never    Active member of club or organization: No    Attends meetings of clubs or organizations: Never    Relationship status: Living with partner  Other Topics Concern  . Not on file  Social History Narrative  . Not on file    Outpatient Encounter Medications as of 11/11/2018  Medication Sig  . acetaminophen (TYLENOL) 325 MG tablet Take 2 tablets (650 mg total) by mouth every 6 (six) hours as needed for mild  pain (or Fever >/= 101).  Marland Kitchen azelastine (ASTELIN) 0.1 % nasal spray USE 2 SPRAYS IN EACH NOSTRIL TWICE DAILY AS DIRECTED  . budesonide-formoterol (SYMBICORT) 80-4.5 MCG/ACT inhaler Inhale 2 puffs into the lungs 2 (two) times daily.  . diclofenac (VOLTAREN) 75 MG EC tablet TAKE 1 TABLET(75 MG) BY MOUTH TWICE DAILY  . estradiol (ESTRACE) 0.1 MG/GM vaginal cream INSERT [id number] APPLICATORFUL VAGINALLY THREE TIMES WEEKLY AS DIRECTED  . estradiol (ESTRACE) 1 MG tablet TAKE 1 TABLET(1 MG) BY MOUTH DAILY  . feeding supplement (ENSURE CLINICAL STRENGTH) LIQD Take 237 mLs by mouth 3 (three) times daily with meals. (Patient taking differently: Take 237 mLs by mouth 2 (two) times daily. )  . fluticasone (FLONASE) 50 MCG/ACT nasal spray SHAKE LIQUID AND USE 2 SPRAYS IN EACH NOSTRIL DAILY  . gabapentin (NEURONTIN) 100 MG capsule Take 1 capsule (100 mg total) by mouth 3 (three) times daily.  . hydroxychloroquine (PLAQUENIL) 200 MG tablet Take 200 mg by mouth daily.  Marland Kitchen linaclotide (LINZESS) 290 MCG CAPS capsule TAKE 1 CAPSULE(290 MCG) BY MOUTH DAILY  . megestrol (MEGACE) 20 MG tablet Take 1 tablet (20 mg total) by mouth daily.  . metoCLOPramide (REGLAN) 10 MG tablet Take 1 tablet (10 mg total) by mouth every 8 (eight) hours as needed for nausea.  . pantoprazole (PROTONIX) 40 MG tablet Take 1 tablet (40 mg total) by mouth 2 (two) times daily before a meal.  . potassium chloride SA (K-DUR) 20 MEQ tablet TAKE 1 TABLET BY MOUTH EVERY DAY  . pravastatin (PRAVACHOL) 20 MG tablet TAKE 1 TABLET(20 MG) BY MOUTH DAILY  . progesterone (PROMETRIUM) 200 MG capsule TAKE 1 CAPSULE BY MOUTH EVERY DAY FOR 2 WEEKS EVERY THIRD MONTH: JANUARY, APRIL, JULY AND OCTOBER  . promethazine (PHENERGAN) 25 MG suppository Place 1 suppository (25 mg total) rectally every 6 (six) hours as needed for nausea or vomiting.  . propranolol (INDERAL) 10 MG tablet Take 1 tablet (10 mg total) by mouth 3 (three) times daily.  . risperiDONE (RISPERDAL) 0.5 MG  tablet Take 0.5 mg at bedtime by mouth.  . sertraline (ZOLOFT) 50 MG tablet Take 50 mg by mouth at bedtime.   . sucralfate (CARAFATE) 1  g tablet One tablet every morning and at bedtime for stomach burning.  Marland Kitchen tiZANidine (ZANAFLEX) 4 MG tablet TAKE 1 TABLET(4 MG) BY MOUTH BACK TWICE DAILY AS NEEDED FOR SPASMS  . VENTOLIN HFA 108 (90 Base) MCG/ACT inhaler INHALE 2 PUFFS INTO THE LUNGS EVERY 6 HOURS AS NEEDED FOR SHORTNESS OF BREATH  . zonisamide (ZONEGRAN) 50 MG capsule Take 150 mg by mouth at bedtime.    No facility-administered encounter medications on file as of 11/11/2018.     Activities of Daily Living In your present state of health, do you have any difficulty performing the following activities: 11/11/2018 05/31/2018  Hearing? N N  Vision? N N  Difficulty concentrating or making decisions? N N  Walking or climbing stairs? N N  Dressing or bathing? N N  Doing errands, shopping? N N  Some recent data might be hidden    Patient Care Team: [name], MD as PCP - General Rourk, Cristopher Estimable, MD (Gastroenterology) Whitney Muse Kelby Fam, MD (Inactive) as Consulting Physician (Hematology and Oncology) Scherrie November, MD as Referring Physician (Gastroenterology) Mansouraty, Telford Nab., MD as Consulting Physician (Gastroenterology) Michael Boston, MD as Consulting Physician (General Surgery)    Assessment:   This is a routine wellness examination for Tracey Daniel.  Exercise Activities and Dietary recommendations Current Exercise Habits: Home exercise routine, Type of exercise: stretching;walking, Time (Minutes): 30, Frequency (Times/Week): 7, Weekly Exercise (Minutes/Week): 210, Intensity: Mild, Exercise limited by: neurologic condition(s);psychological condition(s)  Goals    . DIET - INCREASE LEAN PROTEINS    . Exercise 3x per week (30 min per time)     Recommend increasing your exercise program at least 3 days a week for 30-45 minutes at a time as tolerated.      . Quit Smoking        Fall Risk Fall Risk  11/11/2018 09/08/2018 06/29/2018 06/08/2018 02/01/2018  Falls in the past year? 0 0 0 0 0  Number falls in past yr: 0 0 - - -  Injury with Fall? 0 0 0 0 -  Risk for fall due to : - - - - -  Follow up Falls evaluation completed;Education provided - - - -   Is the patient's home free of loose throw rugs in walkways, pet beds, electrical cords, etc?   yes      Grab bars in the bathroom? yes      Handrails on the stairs?   yes      Adequate lighting?   yes    Depression Screen PHQ 2/9 Scores 11/11/2018 09/08/2018 06/29/2018 06/08/2018  PHQ - 2 Score 0 5 0 0  PHQ- 9 Score - 11 - -     Cognitive Function     6CIT Screen 11/08/2017 06/04/2016  What Year? 0 points 0 points  What month? 0 points 0 points  What time? 0 points 0 points  Count back from 20 0 points 0 points  Months in reverse 0 points 0 points  Repeat phrase 2 points 0 points  Total Score 2 0    Immunization History  Administered Date(s) Administered  . Influenza Split 10/30/2010, 10/09/2011  . Influenza,inj,Quad PF,6+ Mos 11/14/2012, 12/13/2013, 09/18/2014, 10/02/2015, 11/23/2016, 11/08/2017, 09/08/2018  . Pneumococcal Polysaccharide-23 08/21/2010  . Td 06/20/2001  . Tdap 08/21/2010    Qualifies for Shingles Vaccine? n/a  Screening Tests Health Maintenance  Topic Date Due  . PAP SMEAR-Modifier  05/25/2020  . TETANUS/TDAP  08/20/2020  . INFLUENZA VACCINE  Completed  . HIV Screening  Completed    Cancer Screenings: Lung: Low Dose CT Chest recommended if Age 57-80 years, 30 pack-year currently smoking OR have quit w/in 15years. Patient does not qualify. Breast:  Up to date on Mammogram? To be ordered Up to date of Bone Density/Dexa? n/a Colorectal: coming up  Additional Screenings: Hepatitis C Screening:      Plan:      1. Encounter for Medicare annual wellness exam  I have personally reviewed and noted the following in the patient's chart:   . Medical and social history .  Use of alcohol, tobacco or illicit drugs  . Current medications and supplements . Functional ability and status . Nutritional status . Physical activity . Advanced directives . List of other physicians . Hospitalizations, surgeries, and ER visits in previous 12 months . Vitals . Screenings to include cognitive, depression, and falls . Referrals and appointments  In addition, I have reviewed and discussed with patient certain preventive protocols, quality metrics, and best practice recommendations. A written personalized care plan for preventive services as well as general preventive health recommendations were provided to patient.    2. Gastroparesis Stable, Needs refill   Perlie Mayo, NP  11/11/2018

## 2018-11-11 NOTE — Patient Instructions (Signed)
Tracey Daniel , Thank you for taking time to come for your Medicare Wellness Visit. I appreciate your ongoing commitment to your health goals. Please review the following plan we discussed and let me know if I can assist you in the future.   Please continue to practice social distancing to keep you, your family, and our community safe.  If you must go out, please wear a Mask and practice good handwashing.  Screening recommendations/referrals: Colonoscopy: considering your fathers history, we should look at testing you early Mammogram: These will start in the next few years. Bone Density: Due at 65 Recommended yearly ophthalmology/optometry visit for glaucoma screening and checkup Recommended yearly dental visit for hygiene and checkup  Vaccinations: Influenza vaccine: Completed Pneumococcal vaccine: 1 completed Tdap vaccine: Due 2022 Shingles vaccine: No due yet   Advanced directives: Paperwork to be mailed  Conditions/risks identified: Falls   Next appointment: 02/07/2019   Preventive Care 40-64 Years, Female Preventive care refers to lifestyle choices and visits with your health care provider that can promote health and wellness. What does preventive care include?  A yearly physical exam. This is also called an annual well check.  Dental exams once or twice a year.  Routine eye exams. Ask your health care provider how often you should have your eyes checked.  Personal lifestyle choices, including:  Daily care of your teeth and gums.  Regular physical activity.  Eating a healthy diet.  Avoiding tobacco and drug use.  Limiting alcohol use.  Practicing safe sex.  Taking low-dose aspirin daily starting at age 38.  Taking vitamin and mineral supplements as recommended by your health care provider. What happens during an annual well check? The services and screenings done by your health care provider during your annual well check will depend on your age, overall  health, lifestyle risk factors, and family history of disease. Counseling  Your health care provider may ask you questions about your:  Alcohol use.  Tobacco use.  Drug use.  Emotional well-being.  Home and relationship well-being.  Sexual activity.  Eating habits.  Work and work Statistician.  Method of birth control.  Menstrual cycle.  Pregnancy history. Screening  You may have the following tests or measurements:  Height, weight, and BMI.  Blood pressure.  Lipid and cholesterol levels. These may be checked every 5 years, or more frequently if you are over 61 years old.  Skin check.  Lung cancer screening. You may have this screening every year starting at age 78 if you have a 30-pack-year history of smoking and currently smoke or have quit within the past 15 years.  Fecal occult blood test (FOBT) of the stool. You may have this test every year starting at age 104.  Flexible sigmoidoscopy or colonoscopy. You may have a sigmoidoscopy every 5 years or a colonoscopy every 10 years starting at age 32.  Hepatitis C blood test.  Hepatitis B blood test.  Sexually transmitted disease (STD) testing.  Diabetes screening. This is done by checking your blood sugar (glucose) after you have not eaten for a while (fasting). You may have this done every 1-3 years.  Mammogram. This may be done every 1-2 years. Talk to your health care provider about when you should start having regular mammograms. This may depend on whether you have a family history of breast cancer.  BRCA-related cancer screening. This may be done if you have a family history of breast, ovarian, tubal, or peritoneal cancers.  Pelvic exam and Pap test. This  may be done every 3 years starting at age 22. Starting at age 77, this may be done every 5 years if you have a Pap test in combination with an HPV test.  Bone density scan. This is done to screen for osteoporosis. You may have this scan if you are at high  risk for osteoporosis. Discuss your test results, treatment options, and if necessary, the need for more tests with your health care provider. Vaccines  Your health care provider may recommend certain vaccines, such as:  Influenza vaccine. This is recommended every year.  Tetanus, diphtheria, and acellular pertussis (Tdap, Td) vaccine. You may need a Td booster every 10 years.  Zoster vaccine. You may need this after age 66.  Pneumococcal 13-valent conjugate (PCV13) vaccine. You may need this if you have certain conditions and were not previously vaccinated.  Pneumococcal polysaccharide (PPSV23) vaccine. You may need one or two doses if you smoke cigarettes or if you have certain conditions. Talk to your health care provider about which screenings and vaccines you need and how often you need them. This information is not intended to replace advice given to you by your health care provider. Make sure you discuss any questions you have with your health care provider. Document Released: 02/01/2015 Document Revised: 09/25/2015 Document Reviewed: 11/06/2014 Elsevier Interactive Patient Education  2017 Tracey Daniel Prevention in the Home Falls can cause injuries. They can happen to people of all ages. There are many things you can do to make your home safe and to help prevent falls. What can I do on the outside of my home?  Regularly fix the edges of walkways and driveways and fix any cracks.  Remove anything that might make you trip as you walk through a door, such as a raised step or threshold.  Trim any bushes or trees on the path to your home.  Use bright outdoor lighting.  Clear any walking paths of anything that might make someone trip, such as rocks or tools.  Regularly check to see if handrails are loose or broken. Make sure that both sides of any steps have handrails.  Any raised decks and porches should have guardrails on the edges.  Have any leaves, snow, or ice  cleared regularly.  Use sand or salt on walking paths during winter.  Clean up any spills in your garage right away. This includes oil or grease spills. What can I do in the bathroom?  Use night lights.  Install grab bars by the toilet and in the tub and shower. Do not use towel bars as grab bars.  Use non-skid mats or decals in the tub or shower.  If you need to sit down in the shower, use a plastic, non-slip stool.  Keep the floor dry. Clean up any water that spills on the floor as soon as it happens.  Remove soap buildup in the tub or shower regularly.  Attach bath mats securely with double-sided non-slip rug tape.  Do not have throw rugs and other things on the floor that can make you trip. What can I do in the bedroom?  Use night lights.  Make sure that you have a light by your bed that is easy to reach.  Do not use any sheets or blankets that are too big for your bed. They should not hang down onto the floor.  Have a firm chair that has side arms. You can use this for support while you get dressed.  Do not have throw rugs and other things on the floor that can make you trip. What can I do in the kitchen?  Clean up any spills right away.  Avoid walking on wet floors.  Keep items that you use a lot in easy-to-reach places.  If you need to reach something above you, use a strong step stool that has a grab bar.  Keep electrical cords out of the way.  Do not use floor polish or wax that makes floors slippery. If you must use wax, use non-skid floor wax.  Do not have throw rugs and other things on the floor that can make you trip. What can I do with my stairs?  Do not leave any items on the stairs.  Make sure that there are handrails on both sides of the stairs and use them. Fix handrails that are broken or loose. Make sure that handrails are as long as the stairways.  Check any carpeting to make sure that it is firmly attached to the stairs. Fix any carpet that  is loose or worn.  Avoid having throw rugs at the top or bottom of the stairs. If you do have throw rugs, attach them to the floor with carpet tape.  Make sure that you have a light switch at the top of the stairs and the bottom of the stairs. If you do not have them, ask someone to add them for you. What else can I do to help prevent falls?  Wear shoes that:  Do not have high heels.  Have rubber bottoms.  Are comfortable and fit you well.  Are closed at the toe. Do not wear sandals.  If you use a stepladder:  Make sure that it is fully opened. Do not climb a closed stepladder.  Make sure that both sides of the stepladder are locked into place.  Ask someone to hold it for you, if possible.  Clearly mark and make sure that you can see:  Any grab bars or handrails.  First and last steps.  Where the edge of each step is.  Use tools that help you move around (mobility aids) if they are needed. These include:  Canes.  Walkers.  Scooters.  Crutches.  Turn on the lights when you go into a dark area. Replace any light bulbs as soon as they burn out.  Set up your furniture so you have a clear path. Avoid moving your furniture around.  If any of your floors are uneven, fix them.  If there are any pets around you, be aware of where they are.  Review your medicines with your doctor. Some medicines can make you feel dizzy. This can increase your chance of falling. Ask your doctor what other things that you can do to help prevent falls. This information is not intended to replace advice given to you by your health care provider. Make sure you discuss any questions you have with your health care provider. Document Released: 11/01/2008 Document Revised: 06/13/2015 Document Reviewed: 02/09/2014 Elsevier Interactive Patient Education  2017 [employer].

## 2018-11-23 DIAGNOSIS — G518 Other disorders of facial nerve: Secondary | ICD-10-CM | POA: Diagnosis not present

## 2018-11-23 DIAGNOSIS — G43719 Chronic migraine without aura, intractable, without status migrainosus: Secondary | ICD-10-CM | POA: Diagnosis not present

## 2018-11-23 DIAGNOSIS — M791 Myalgia, unspecified site: Secondary | ICD-10-CM | POA: Diagnosis not present

## 2018-11-23 DIAGNOSIS — M542 Cervicalgia: Secondary | ICD-10-CM | POA: Diagnosis not present

## 2018-11-25 ENCOUNTER — Other Ambulatory Visit: Payer: Self-pay

## 2018-11-25 MED ORDER — FLUTICASONE PROPIONATE 50 MCG/ACT NA SUSP: NASAL | 2 refills | Status: DC

## 2018-11-29 ENCOUNTER — Other Ambulatory Visit: Payer: Self-pay

## 2018-11-29 MED ORDER — FLUTICASONE PROPIONATE 50 MCG/ACT NA SUSP: NASAL | 2 refills | Status: DC

## 2018-12-05 ENCOUNTER — Telehealth: Payer: Self-pay | Admitting: *Deleted

## 2018-12-05 NOTE — Telephone Encounter (Signed)
Pt called said she needed her nausea medicine sent to [facility]

## 2018-12-05 NOTE — Telephone Encounter (Signed)
Will contact GI for refill of nausea med

## 2018-12-06 ENCOUNTER — Other Ambulatory Visit: Payer: Self-pay

## 2018-12-06 ENCOUNTER — Telehealth: Payer: Self-pay

## 2018-12-06 DIAGNOSIS — R110 Nausea: Secondary | ICD-10-CM

## 2018-12-06 DIAGNOSIS — K3184 Gastroparesis: Secondary | ICD-10-CM

## 2018-12-06 MED ORDER — METOCLOPRAMIDE HCL 10 MG PO TABS: 10.0000 mg | ORAL_TABLET | Freq: Three times a day (TID) | ORAL | 0 refills | Status: DC | PRN

## 2018-12-06 MED ORDER — ONDANSETRON HCL 4 MG PO TABS: 4.0000 mg | ORAL_TABLET | Freq: Four times a day (QID) | ORAL | 2 refills | Status: DC | PRN

## 2018-12-06 NOTE — Telephone Encounter (Signed)
Pt called to request RX Zofran for nausea and vomiting. Pt got her last refill from Dr. Griffin Dakin office and they would like pt to start back receiving the medication from our office.

## 2018-12-06 NOTE — Addendum Note (Signed)
Addended by: Gordy Levan, Letizia Hook A on: 12/06/2018 04:32 PM   Modules accepted: Orders

## 2018-12-06 NOTE — Telephone Encounter (Signed)
Noted. Pt notified that medication was sent to [facility]. Pt doesn't need it a [employer] order currently.

## 2018-12-06 NOTE — Telephone Encounter (Signed)
Rx sent to local pharmacy. Let me know if mail order through [facility] is needed.

## 2018-12-29 ENCOUNTER — Encounter: Payer: Self-pay | Admitting: Internal Medicine

## 2018-12-29 ENCOUNTER — Ambulatory Visit (INDEPENDENT_AMBULATORY_CARE_PROVIDER_SITE_OTHER): Payer: Medicare HMO | Admitting: Nurse Practitioner

## 2018-12-29 ENCOUNTER — Encounter: Payer: Self-pay | Admitting: Nurse Practitioner

## 2018-12-29 ENCOUNTER — Other Ambulatory Visit: Payer: Self-pay

## 2018-12-29 DIAGNOSIS — R110 Nausea: Secondary | ICD-10-CM | POA: Diagnosis not present

## 2018-12-29 DIAGNOSIS — K219 Gastro-esophageal reflux disease without esophagitis: Secondary | ICD-10-CM

## 2018-12-29 DIAGNOSIS — R109 Unspecified abdominal pain: Secondary | ICD-10-CM

## 2018-12-29 DIAGNOSIS — K5900 Constipation, unspecified: Secondary | ICD-10-CM | POA: Diagnosis not present

## 2018-12-29 NOTE — Patient Instructions (Addendum)
Your health issues we discussed today were:   GERD (reflux/heartburn) with nausea: 1. Continue taking your medications, specifically Protonix (pantoprazole), nausea medication, Carafate 2. Continue to eat small, softer foods. 3. Avoid trigger foods that make your reflux symptoms worse 4. Call us if you have any worsening or severe symptoms  Constipation with abdominal pain: 1. I feel your abdominal pain is likely because of your somewhat worsening constipation 2. I understand Linzess 290 mcg may be too much and cause fecal urgency/diarrhea 3. When we receive samples I will put a few boxes of Linzess 145 mcg at the front desk.  In the meantime, as we discussed, open the capsule and empty half of the medication into a few teaspoons of water and take that. 4. Try to take 145 mg of Linzess every day to see if this helps you have more regular bowel movements 5. Call us if you have any problems 6. Call us regardless in 2 weeks and let us know if this is helping you have better bowel movements.  Overall I recommend:  1. Continue your other current medications 2. Return for follow-up in 4 months 3. Call us if you have any questions or concerns.   Because of recent events of COVID-19 ("Coronavirus"), follow CDC recommendations:  1. Wash your hand frequently 2. Avoid touching your face 3. Stay away from people who are sick 4. If you have symptoms such as fever, cough, shortness of breath then call your healthcare provider for further guidance 5. If you are sick, STAY AT HOME unless otherwise directed by your healthcare provider. 6. Follow directions from state and national officials regarding staying safe   At [facility] we value your feedback. You may receive a survey about your visit today. Please share your experience as we strive to create trusting relationships with our patients to provide genuine, compassionate, quality care.  We appreciate your understanding and  patience as we review any laboratory studies, imaging, and other diagnostic tests that are ordered as we care for you. Our office policy is 5 business days for review of these results, and any emergent or urgent results are addressed in a timely manner for your best interest. If you do not hear from our office in 1 week, please contact us.   We also encourage the use of MyChart, which contains your medical information for your review as well. If you are not enrolled in this feature, an access code is on this after visit summary for your convenience. Thank you for allowing [place] to be involved in your care.  It was great to see you today!  I hope you have a Merry Christmas and Happy Holidays!!

## 2018-12-29 NOTE — Progress Notes (Signed)
Referring Provider: [name], MD Primary Care Physician:  [name], MD Primary GI:  Dr. Gala Romney  NOTE: Service was provided via telemedicine and was requested by the patient due to COVID-19 pandemic.  Method of visit: Doxy.Me  Patient Location: [place] (out of town)  Provider Location: Office  Reason for Phone Visit: Follow-up  The patient was consented to phone follow-up via telephone encounter including billing of the encounter (yes/no): Yes  Persons present on the phone encounter, with roles: Fiance  Total time (minutes) spent on medical discussion: 22 minutes  Chief Complaint  Patient presents with   Gastroesophageal Reflux    f/u. Doing okay now. n/v has calmed down.   Abdominal Pain    f/u. left side going into back   Constipation    patient reports taking linzess daily and still unable to have BM until 1 week later    HPI:   Tracey Daniel is a 38 y.o. female who presents for virtual visit regarding: GERD and abdominal pain.  The patient was last seen in our office 09/29/2018 for GERD, gastric outlet obstruction, nausea without vomiting.  The patient's last visit was a virtual office visit due to COVID-19/coronavirus pandemic.  History of gastric outlet obstruction, pyloric spasm/stenosis, cannabinoid hyperemesis syndrome.  She has previously had Botox injections into her pyloric valve.  Recent CT imaging during hospitalization in May 2020 was unremarkable.  She was admitted for abdominal pain and nausea with vomiting with noted chronic with marijuana use possibly contributing.  Query role of possible surgical consult given normal-appearing pyloric valve.  She has struggled with GERD chronically.  Most recent EGD 06/01/2018 with esophagus status post biopsy, small hiatal hernia, erythematous mucosa in the cardia, gastric fundus, and gastric body.  Erosive gastropathy status post biopsy, normal pylorus which was dilated and injected with Botox.   Duodenal mucosa status post biopsy.  Surgical pathology found duodenum, stomach, esophageal biopsies all essentially benign mucosa.  Recent gastric emptying study normal compared to the one previously completed in 2012 which was delayed.  Recommended esophageal manometry and then discussion for options with the most aggressive option being fundoplication pyloroplasty although other etiologies would like to be ruled out first.  At her last visit she was still having abdominal pain that is now more constant been intermittent and worse with food.  Muscle relaxer has not helped.  Poor appetite for which she was prescribed Megace by primary care which is helped some.  Not eating much due to pain, still using Ensure.  Early satiety continues.  Abdominal pain is periumbilical and radiates to her back and is a squeezing pain every morning.  Antiemetic helps nausea.  Weight down 8 pounds objectively in the past 7 months.  GERD well managed on Protonix twice daily, regular bowel movements on Linzess.  No other overt GI complaints.  Recommended continued small, soft meals with Ensure supplementation.  Avoid/abstain from marijuana, continue antiemetic, complete esophageal manometry.  Follow-up in 3 months.  Esophageal manometry was completed 10/03/2018 and findings of normal relaxation of the EG junction, no significant esophageal peristaltic abnormality detected.  Today she states she's doing ok overall. Having abdominal pain "once in a while" and ongoing for the past few weeks, LLQ radiating to the back. Described as crampy. Still on Linzess 1-2 times a week. Has a bowel movement about once every 2-3 weeks. When she has a bowel movement, pain typically gets better. Hard stools, lots of straining. Denies hematochezia, melena. Incomplete emptying.  GERD doing well on Protonix, nausea has improved on antiemetic and carafate. Only gets nauseated if she doesn't have her Zofran. Denies fever, chills, unintentional weight  loss. Denies URI or flu-like symptoms. Denies loss of sense of taste or smell. Has occasional/rare chest pain every now and then, hasn't told PCP but will tell them when she gets back in town today. Denies dyspnea, dizziness, lightheadedness, syncope, near syncope. Denies any other upper or lower GI symptoms.  She's not sure if she's taking Reglan or not.  Past Medical History:  Diagnosis Date   Anemia of other chronic disease 11/29/2012   Asthma    Back pain, chronic    Chronic abdominal pain    Constipation    COPD (chronic obstructive pulmonary disease) (HCC)    Cyst of skin    mid spine area   Depression 2000   h/o suicidal ideation   Gastric outlet obstruction    Gastritis    Gastroparesis    Migraine    Migraines    Nausea and vomiting    recurrent   Nicotine addiction    Osteoporosis    Peptic ulcer disease    Pyloric spasm 03/30/2011   Seasonal allergies    Sinusitis    Substance abuse ([place]) 2008   marijuana   Vitamin D deficiency     Past Surgical History:  Procedure Laterality Date   BALLOON DILATION N/A 02/09/2013   Procedure: BALLOON DILATION;  Surgeon: Missy Sabins, MD;  Location: [facility];  Service: Endoscopy;  Laterality: N/A;   BALLOON DILATION N/A 03/10/2018   Procedure: BALLOON DILATION;  Surgeon: Daneil Dolin, MD;  [facility];  Service: Endoscopy;  Laterality: N/A;   BILIARY DILATION  06/01/2018   Procedure: PYLORIC DILATION;  Surgeon: Rush Landmark Telford Nab., MD;  Location: [place];  Service: Gastroenterology;;   BIOPSY  06/01/2018   Procedure: BIOPSY;  Surgeon: Irving Copas., MD;  [facility];  Service: Gastroenterology;;   BOTOX INJECTION N/A 02/09/2013   Procedure: BOTOX INJECTION;  Surgeon: Missy Sabins, MD;  Location: [facility];  Service: Endoscopy;  Laterality: N/A;  possible balloon   BOTOX INJECTION N/A 10/24/2015   Procedure: BOTOX INJECTION;  Surgeon: Daneil Dolin, MD;   [facility];  Service: Endoscopy;  Laterality: N/A;   BOTOX INJECTION N/A 03/10/2018   Procedure: BOTOX INJECTION;  Surgeon: Daneil Dolin, MD;  [facility];  Service: Endoscopy;  Laterality: N/A;  Melanie notified   BOTOX INJECTION  06/01/2018   Procedure: BOTOX INJECTION;  Surgeon: Rush Landmark Telford Nab., MD;  Location: [place];  Service: Gastroenterology;;   CARPAL TUNNEL RELEASE     left hand   COLONOSCOPY WITH PROPOFOL N/A 09/20/2014   RMR: Normal ileo-colonoscopy   ESOPHAGEAL DILATION  12/25/2010   Procedure: ESOPHAGEAL DILATION;  Surgeon: Dorothyann Peng, MD;  [facility];  Service: Endoscopy;;   ESOPHAGEAL MANOMETRY N/A 10/03/2018   Procedure: ESOPHAGEAL MANOMETRY (EM);  Surgeon: Mauri Pole, MD;  Location: [facility];  Service: Endoscopy;  Laterality: N/A;   ESOPHAGOGASTRODUODENOSCOPY  12/25/2010   Dorothyann Peng, MD;  moderate gastritis, ?goo secondary to pylorspasm. BX showed reactive gstropathy no h.pyori, SB mucosa with intramucosal lymphocytosis and partial villous blunting (TTG 4.0 normal)   ESOPHAGOGASTRODUODENOSCOPY N/A 11/14/2012   [id number] hiatal hernia. Abnormal gastric mucosa of  uncertain significance-status post biopsy. Subjectively, patient may have recurrent symptomatic, pylorospasm.   ESOPHAGOGASTRODUODENOSCOPY (EGD) WITH PROPOFOL N/A 02/09/2013  elongated stomach, partial lower esophageal ring widely patent. No obvious pyloric stenosis s/p Botox   ESOPHAGOGASTRODUODENOSCOPY (EGD) WITH PROPOFOL N/A 10/24/2015   Procedure: ESOPHAGOGASTRODUODENOSCOPY (EGD) WITH PROPOFOL;  Surgeon: Daneil Dolin, MD;  [facility];  Service: Endoscopy;  Laterality: N/A;  830    ESOPHAGOGASTRODUODENOSCOPY (EGD) WITH PROPOFOL N/A 03/10/2018   Dr. Gala Romney: normal esophagus, elongated, dilated stomach likely stigmata of slow gastric emptying due to narrowing of pyloric channel, s/p balloon dilatation of pyloric channel. Small  hiatal hernia, normal duodenal bulb and second portion of duodenum   ESOPHAGOGASTRODUODENOSCOPY (EGD) WITH PROPOFOL N/A 06/01/2018   Procedure: ESOPHAGOGASTRODUODENOSCOPY (EGD) WITH PROPOFOL;  Surgeon: Rush Landmark Telford Nab., MD;  Location: [place];  Service: Gastroenterology;  Laterality: N/A;   LAPAROSCOPIC CHOLECYSTECTOMY  2002   [place] Na?  [place]?   laser surgery on cervix     NASAL SEPTUM SURGERY  01/29/2017   TOE SURGERY      Current Outpatient Medications  Medication Sig Dispense Refill   acetaminophen (TYLENOL) 325 MG tablet Take 2 tablets (650 mg total) by mouth every 6 (six) hours as needed for mild pain (or Fever >/= 101). 20 tablet 0   azelastine (ASTELIN) 0.1 % nasal spray USE 2 SPRAYS IN EACH NOSTRIL TWICE DAILY AS DIRECTED 30 mL 5   budesonide-formoterol (SYMBICORT) 80-4.5 MCG/ACT inhaler Inhale 2 puffs into the lungs 2 (two) times daily. 1 Inhaler 3   diclofenac (VOLTAREN) 75 MG EC tablet TAKE 1 TABLET(75 MG) BY MOUTH TWICE DAILY 180 tablet 1   estradiol (ESTRACE) 0.1 MG/GM vaginal cream INSERT [id number] APPLICATORFUL VAGINALLY THREE TIMES WEEKLY AS DIRECTED 42.5 g 4   estradiol (ESTRACE) 1 MG tablet TAKE 1 TABLET(1 MG) BY MOUTH DAILY 90 tablet 3   feeding supplement (ENSURE CLINICAL STRENGTH) LIQD Take 237 mLs by mouth 3 (three) times daily with meals. (Patient taking differently: Take 237 mLs by mouth daily. ) 10 Bottle 3   fluticasone (FLONASE) 50 MCG/ACT nasal spray SHAKE LIQUID AND USE 2 SPRAYS IN EACH NOSTRIL DAILY 48 g 2   gabapentin (NEURONTIN) 100 MG capsule Take 1 capsule (100 mg total) by mouth 3 (three) times daily. 90 capsule 5   hydroxychloroquine (PLAQUENIL) 200 MG tablet Take 200 mg by mouth daily.     linaclotide (LINZESS) 290 MCG CAPS capsule TAKE 1 CAPSULE(290 MCG) BY MOUTH DAILY 90 capsule 3   megestrol (MEGACE) 20 MG tablet Take 1 tablet (20 mg total) by mouth daily. 30 tablet 3   ondansetron (ZOFRAN) 4 MG tablet Take 1 tablet (4 mg  total) by mouth every 6 (six) hours as needed for nausea or vomiting. 30 tablet 2   pantoprazole (PROTONIX) 40 MG tablet Take 1 tablet (40 mg total) by mouth 2 (two) times daily before a meal. 180 tablet 3   potassium chloride SA (K-DUR) 20 MEQ tablet TAKE 1 TABLET BY MOUTH EVERY DAY 90 tablet 3   pravastatin (PRAVACHOL) 20 MG tablet TAKE 1 TABLET(20 MG) BY MOUTH DAILY 90 tablet 1   progesterone (PROMETRIUM) 200 MG capsule TAKE 1 CAPSULE BY MOUTH EVERY DAY FOR 2 WEEKS EVERY THIRD MONTH: JANUARY, APRIL, JULY AND OCTOBER 14 capsule 2   promethazine (PHENERGAN) 25 MG suppository Place 1 suppository (25 mg total) rectally every 6 (six) hours as needed for nausea or vomiting. 6 each 0   propranolol (INDERAL) 10 MG tablet Take 1 tablet (10 mg total) by mouth 3 (three) times daily. 90 tablet 2   risperiDONE (RISPERDAL) 0.5 MG tablet  Take 0.5 mg at bedtime by mouth.     sertraline (ZOLOFT) 50 MG tablet Take 50 mg by mouth at bedtime.      sucralfate (CARAFATE) 1 g tablet One tablet every morning and at bedtime for stomach burning. 180 tablet 3   tiZANidine (ZANAFLEX) 4 MG tablet TAKE 1 TABLET(4 MG) BY MOUTH BACK TWICE DAILY AS NEEDED FOR SPASMS 180 tablet 1   VENTOLIN HFA 108 (90 Base) MCG/ACT inhaler INHALE 2 PUFFS INTO THE LUNGS EVERY 6 HOURS AS NEEDED FOR SHORTNESS OF BREATH 18 g 0   zonisamide (ZONEGRAN) 50 MG capsule Take 150 mg by mouth at bedtime.   2   metoCLOPramide (REGLAN) 10 MG tablet Take 1 tablet (10 mg total) by mouth every 8 (eight) hours as needed for nausea. (Patient not taking: Reported on 12/29/2018) 20 tablet 0   No current facility-administered medications for this visit.    Allergies as of 12/29/2018 - Review Complete 12/29/2018  Allergen Reaction Noted   Benadryl [diphenhydramine] Anaphylaxis 03/25/2014   Penicillins Other (See Comments)    Latex Itching and Other (See Comments) 11/30/2011    Family History  Problem Relation Age of Onset   Diabetes Father      Liver disease Father        liver transplant at [facility], age 21   Lung cancer Mother 23   Heart disease Maternal Grandmother    Parkinson's disease Maternal Grandfather    Multiple sclerosis Sister 61   Depression Sister 9   Alcohol abuse Sister    Bipolar disorder Sister    Schizophrenia Sister    Colon cancer Neg Hx     Social History   Socioeconomic History   Marital status: Significant Other    Spouse name: Not on file   Number of children: 0   Years of education: 9   Highest education level: 9th grade  Occupational History   Occupation: disability    Fish farm manager: UNEMPLOYED  Tobacco Use   Smoking status: Current Every Day Smoker    Packs/day: 0.10    Years: 15.00    Pack years: 1.50    Types: Cigarettes   Smokeless tobacco: Never Used   Tobacco comment: 2 per day  Substance and Sexual Activity   Alcohol use: No    Alcohol/week: 0.0 standard drinks   Drug use: Yes    Frequency: 2.0 times per week    Types: Marijuana    Comment: trying to quit   Sexual activity: Yes    Birth control/protection: None  Other Topics Concern   Not on file  Social History Narrative   Not on file   Social Determinants of Health   Financial Resource Strain: Low Risk    Difficulty of Paying Living Expenses: Not very hard  Food Insecurity: Food Insecurity Present   Worried About Running Out of Food in the Last Year: Sometimes true   Ran Out of Food in the Last Year: Sometimes true  Transportation Needs: No Transportation Needs   Lack of Transportation (Medical): No   Lack of Transportation (Non-Medical): No  Physical Activity: Insufficiently Active   Days of Exercise per Week: 3 days   Minutes of Exercise per Session: 30 min  Stress: No Stress Concern Present   Feeling of Stress : Not at all  Social Connections: Somewhat Isolated   Frequency of Communication with Friends and Family: More than three times a week   Frequency of Social Gatherings  with Friends and Family: Twice a week  Attends Religious Services: Never   Active Member of Clubs or Organizations: No   Attends Archivist Meetings: Never   Marital Status: Living with partner    Review of Systems: General: Negative for anorexia, weight loss, fever, chills, fatigue, weakness. ENT: Negative for hoarseness, difficulty swallowing. CV: Negative for chest pain, angina, palpitations, peripheral edema.  Respiratory: Negative for dyspnea at rest, cough, sputum, wheezing.  GI: See history of present illness. Endo: Negative for unusual weight change.  Heme: Negative for bruising or bleeding. Allergy: Negative for rash or hives.  Physical Exam: Note: limited exam due to virtual visit General:   Alert and oriented. Pleasant and cooperative. Well-nourished and well-developed.  Eyes:  Without icterus, sclera clear and conjunctiva pink.  Ears:  Normal auditory acuity. Skin:  Intact without facial significant lesions or rashes of the face. Neurologic:  Alert and oriented x4;  grossly normal neurologically. Psych:  Alert and cooperative. Normal mood and affect. Heme/Lymph/Immune: No excessive bruising noted.

## 2018-12-29 NOTE — Assessment & Plan Note (Signed)
GERD currently doing well on PPI.  Nausea has improved.  Recommend she continue her current medications and follow-up in 4 months.  Call for any worsening or severe symptoms.

## 2018-12-29 NOTE — Assessment & Plan Note (Signed)
Tricky to manage constipation.  She is currently on Linzess 290 mcg but only takes this about a couple few times a week because of fecal urgency.  She is only having a good bowel movement every 1 to 2 weeks.  I think she may be better served by reducing her dose of Linzess to 145 mcg that she can take it every day to help promote more regular bowel movements, complete emptying.  We will try to leave samples will be obtained then.  In the meantime I gave her instructions on how to open the capsule and mix half of the medication with liquid and take that.  Follow-up in 4 months.  Call us for any worsening or severe symptoms.

## 2018-12-29 NOTE — Assessment & Plan Note (Signed)
Previous abdominal pain improved.  Currently having left lower quadrant abdominal pain/discomfort that radiates to her back.  Likely due to uncontrolled constipation as per above.  Nausea and GERD improved otherwise.  Further constipation recommendations as per above.  Her pain does typically get better after a bowel movement.  Return for follow-up in 4 months.  Call for any worsening or severe symptoms.

## 2018-12-29 NOTE — Assessment & Plan Note (Signed)
Suspect nausea rather than improvement.  She will have nausea if she does not take her antiemetic.  Recommend to continue her antiemetics and Carafate as a seem to be helping.  Continue other current medications and follow-up in 4 months.  Call us for worsening or recurrent symptoms.

## 2019-01-04 DIAGNOSIS — M542 Cervicalgia: Secondary | ICD-10-CM | POA: Diagnosis not present

## 2019-01-04 DIAGNOSIS — G43719 Chronic migraine without aura, intractable, without status migrainosus: Secondary | ICD-10-CM | POA: Diagnosis not present

## 2019-01-04 DIAGNOSIS — G518 Other disorders of facial nerve: Secondary | ICD-10-CM | POA: Diagnosis not present

## 2019-01-04 DIAGNOSIS — M791 Myalgia, unspecified site: Secondary | ICD-10-CM | POA: Diagnosis not present

## 2019-01-11 ENCOUNTER — Other Ambulatory Visit: Payer: Self-pay

## 2019-01-11 DIAGNOSIS — K3184 Gastroparesis: Secondary | ICD-10-CM

## 2019-01-11 MED ORDER — METOCLOPRAMIDE HCL 10 MG PO TABS: 10.0000 mg | ORAL_TABLET | Freq: Three times a day (TID) | ORAL | 0 refills | Status: DC | PRN

## 2019-01-21 ENCOUNTER — Other Ambulatory Visit: Payer: Self-pay | Admitting: Family Medicine

## 2019-01-21 ENCOUNTER — Other Ambulatory Visit: Payer: Self-pay | Admitting: Obstetrics and Gynecology

## 2019-01-24 ENCOUNTER — Other Ambulatory Visit: Payer: Self-pay

## 2019-01-24 MED ORDER — GABAPENTIN 100 MG PO CAPS: 100.0000 mg | ORAL_CAPSULE | Freq: Three times a day (TID) | ORAL | 5 refills | Status: DC

## 2019-02-07 ENCOUNTER — Other Ambulatory Visit: Payer: Self-pay

## 2019-02-07 ENCOUNTER — Ambulatory Visit (INDEPENDENT_AMBULATORY_CARE_PROVIDER_SITE_OTHER): Payer: Medicare HMO | Admitting: Family Medicine

## 2019-02-07 ENCOUNTER — Encounter: Payer: Self-pay | Admitting: Family Medicine

## 2019-02-07 VITALS — BP 100/62 | Ht 66.0 in | Wt 153.0 lb

## 2019-02-07 DIAGNOSIS — R110 Nausea: Secondary | ICD-10-CM

## 2019-02-07 DIAGNOSIS — F17210 Nicotine dependence, cigarettes, uncomplicated: Secondary | ICD-10-CM | POA: Diagnosis not present

## 2019-02-07 DIAGNOSIS — Z720 Tobacco use: Secondary | ICD-10-CM | POA: Diagnosis not present

## 2019-02-07 DIAGNOSIS — N63 Unspecified lump in unspecified breast: Secondary | ICD-10-CM | POA: Insufficient documentation

## 2019-02-07 DIAGNOSIS — J302 Other seasonal allergic rhinitis: Secondary | ICD-10-CM | POA: Diagnosis not present

## 2019-02-07 DIAGNOSIS — N644 Mastodynia: Secondary | ICD-10-CM | POA: Diagnosis not present

## 2019-02-07 DIAGNOSIS — Z716 Tobacco abuse counseling: Secondary | ICD-10-CM

## 2019-02-07 DIAGNOSIS — F329 Major depressive disorder, single episode, unspecified: Secondary | ICD-10-CM | POA: Diagnosis not present

## 2019-02-07 DIAGNOSIS — N6320 Unspecified lump in the left breast, unspecified quadrant: Secondary | ICD-10-CM

## 2019-02-07 DIAGNOSIS — F32A Depression, unspecified: Secondary | ICD-10-CM

## 2019-02-07 DIAGNOSIS — R8789 Other abnormal findings in specimens from female genital organs: Secondary | ICD-10-CM

## 2019-02-07 DIAGNOSIS — N6310 Unspecified lump in the right breast, unspecified quadrant: Secondary | ICD-10-CM

## 2019-02-07 DIAGNOSIS — R87618 Other abnormal cytological findings on specimens from cervix uteri: Secondary | ICD-10-CM

## 2019-02-07 DIAGNOSIS — R87619 Unspecified abnormal cytological findings in specimens from cervix uteri: Secondary | ICD-10-CM

## 2019-02-07 DIAGNOSIS — E2839 Other primary ovarian failure: Secondary | ICD-10-CM | POA: Diagnosis not present

## 2019-02-07 MED ORDER — ONDANSETRON HCL 4 MG PO TABS: 4.0000 mg | ORAL_TABLET | Freq: Four times a day (QID) | ORAL | 2 refills | Status: DC | PRN

## 2019-02-07 NOTE — Progress Notes (Signed)
Virtual Visit via Telephone Note  I connected with Tracey Daniel on 02/07/19 at  8:00 AM EST by telephone and verified that I am speaking with the correct person using two identifiers.  Location: Patient: home Provider: office   I discussed the limitations, risks, security and privacy concerns of performing an evaluation and management service by telephone and the availability of in person appointments. I also discussed with the patient that there may be a patient responsible charge related to this service. The patient expressed understanding and agreed to proceed.   History of Present Illness: Right breast pain started 4 days ago, and states she feels a lump in the area of the pain yesterday on right side, has had left breast pain in the  nipple in the past, though not as before. Pain is a 7 when there is direct breast pressure size of lump  is dime sized No recent flares of abdominal pain C/o uncontrolled hot flashes and requests gyne exam , has abnormal cytology in the past Denies recent fever or chills. Denies sinus pressure, nasal congestion, ear pain or sore throat. Denies chest congestion, productive cough or wheezing. Denies chest pains, palpitations and leg swelling Denies abdominal pain, nausea, vomiting,diarrhea or constipation.   Denies dysuria, frequency, hesitancy or incontinence. Denies joint pain, swelling and limitation in mobility. Denies headaches, seizures, numbness, or tingling. Denies uncontrolled  depression, anxiety or insomnia. Denies skin break down or rash.      Observations/Objective: BP 100/62   Ht 5\' 6"  (1.676 m)   Wt 153 lb (69.4 kg)   LMP  (LMP Unknown) Comment: pt reports premature menopause  BMI 24.69 kg/m    Good communication with no confusion and intact memory. Alert and oriented x 3 No signs of respiratory distress during speech     Assessment and Plan:  Breast mass in female 4 day h/o localized pain and left breast mass, dime  sized , will refer for imaging  Tobacco abuse Asked:confirms currently smokes cigarettes Assess: willing to quit and  cutting back, quit date set for 03/05/2019 Advise: needs to QUIT to reduce risk of cancer, cardio and cerebrovascular disease Assist: counseled for 5 minutes and literature provided Arrange: follow up in 3 months   Breast pain, right Refer for imaging for comparison as not the current cause of concern  Seasonal allergies Controlled, no change in medication   Depression Controlled, no change in medication   Premature ovarian failure Reports uncontrolled hot flashes, gyne to re evaluate  Abnormal Pap smear of cervix Gyne yo re evalk and address hot flashes also, referred   Follow Up Instructions:    I discussed the assessment and treatment plan with the patient. The patient was provided an opportunity to ask questions and all were answered. The patient agreed with the plan and demonstrated an understanding of the instructions.   The patient was advised to call back or seek an in-person evaluation if the symptoms worsen or if the condition fails to improve as anticipated.  I provided 20 minutes of non-face-to-face time during this encounter.   [name], MD

## 2019-02-07 NOTE — Patient Instructions (Addendum)
F/u in office with MD in 3 months, call if you need me sooner  You arfe referred for Gyne exam and to discusss hot flashes  ''Quit dATE FOR Stopping smoking is Feb 14, call 1800qUITNOW to help with this  You are referred for [place] of right breast we will call with appt info   Steps to Quit Smoking Smoking tobacco is the leading cause of preventable death. It can affect almost every organ in the body. Smoking puts you and people around you at risk for many serious, long-lasting (chronic) diseases. Quitting smoking can be hard, but it is one of the best things that you can do for your health. It is never too late to quit. How do I get ready to quit? When you decide to quit smoking, make a plan to help you succeed. Before you quit:  Pick a date to quit. Set a date within the next 2 weeks to give you time to prepare.  Write down the reasons why you are quitting. Keep this list in places where you will see it often.  Tell your family, friends, and co-workers that you are quitting. Their support is important.  Talk with your doctor about the choices that may help you quit.  Find out if your health insurance will pay for these treatments.  Know the people, places, things, and activities that make you want to smoke (triggers). Avoid them. What first steps can I take to quit smoking?  Throw away all cigarettes at home, at work, and in your car.  Throw away the things that you use when you smoke, such as ashtrays and lighters.  Clean your car. Make sure to empty the ashtray.  Clean your home, including curtains and carpets. What can I do to help me quit smoking? Talk with your doctor about taking medicines and seeing a counselor at the same time. You are more likely to succeed when you do both.  If you are pregnant or breastfeeding, talk with your doctor about counseling or other ways to quit smoking. Do not take medicine to help you quit smoking unless your doctor tells you to do so. To  quit smoking: Quit right away  Quit smoking totally, instead of slowly cutting back on how much you smoke over a period of time.  Go to counseling. You are more likely to quit if you go to counseling sessions regularly. Take medicine You may take medicines to help you quit. Some medicines need a prescription, and some you can buy over-the-counter. Some medicines may contain a drug called nicotine to replace the nicotine in cigarettes. Medicines may:  Help you to stop having the desire to smoke (cravings).  Help to stop the problems that come when you stop smoking (withdrawal symptoms). Your doctor may ask you to use:  Nicotine patches, gum, or lozenges.  Nicotine inhalers or sprays.  Non-nicotine medicine that is taken by mouth. Find resources Find resources and other ways to help you quit smoking and remain smoke-free after you quit. These resources are most helpful when you use them often. They include:  Online chats with a Social worker.  Phone quitlines.  Printed Furniture conservator/restorer.  Support groups or group counseling.  Text messaging programs.  Mobile phone apps. Use apps on your mobile phone or tablet that can help you stick to your quit plan. There are many free apps for mobile phones and tablets as well as websites. Examples include Quit Guide from the [employer] and smokefree.gov  What things can  I do to make it easier to quit?   Talk to your family and friends. Ask them to support and encourage you.  Call a phone quitline (1-800-QUIT-NOW), reach out to support groups, or work with a Social worker.  Ask people who smoke to not smoke around you.  Avoid places that make you want to smoke, such as: ? Bars. ? Parties. ? Smoke-break areas at work.  Spend time with people who do not smoke.  Lower the stress in your life. Stress can make you want to smoke. Try these things to help your stress: ? Getting regular exercise. ? Doing deep-breathing exercises. ? Doing  yoga. ? Meditating. ? Doing a body scan. To do this, close your eyes, focus on one area of your body at a time from head to toe. Notice which parts of your body are tense. Try to relax the muscles in those areas. How will I feel when I quit smoking? Day 1 to 3 weeks Within the first 24 hours, you may start to have some problems that come from quitting tobacco. These problems are very bad 2-3 days after you quit, but they do not often last for more than 2-3 weeks. You may get these symptoms:  Mood swings.  Feeling restless, nervous, angry, or annoyed.  Trouble concentrating.  Dizziness.  Strong desire for high-sugar foods and nicotine.  Weight gain.  Trouble pooping (constipation).  Feeling like you may vomit (nausea).  Coughing or a sore throat.  Changes in how the medicines that you take for other issues work in your body.  Depression.  Trouble sleeping (insomnia). Week 3 and afterward After the first 2-3 weeks of quitting, you may start to notice more positive results, such as:  Better sense of smell and taste.  Less coughing and sore throat.  Slower heart rate.  Lower blood pressure.  Clearer skin.  Better breathing.  Fewer sick days. Quitting smoking can be hard. Do not give up if you fail the first time. Some people need to try a few times before they succeed. Do your best to stick to your quit plan, and talk with your doctor if you have any questions or concerns. Summary  Smoking tobacco is the leading cause of preventable death. Quitting smoking can be hard, but it is one of the best things that you can do for your health.  When you decide to quit smoking, make a plan to help you succeed.  Quit smoking right away, not slowly over a period of time.  When you start quitting, seek help from your doctor, family, or friends. This information is not intended to replace advice given to you by your health care provider. Make sure you discuss any questions you  have with your health care provider. Document Revised: 09/30/2018 Document Reviewed: 03/26/2018 Elsevier Patient Education  [place].

## 2019-02-12 DIAGNOSIS — R87619 Unspecified abnormal cytological findings in specimens from cervix uteri: Secondary | ICD-10-CM | POA: Insufficient documentation

## 2019-02-12 NOTE — Assessment & Plan Note (Signed)
4 day h/o localized pain and left breast mass, dime sized , will refer for imaging

## 2019-02-12 NOTE — Assessment & Plan Note (Signed)
Reports uncontrolled hot flashes, gyne to re evaluate

## 2019-02-12 NOTE — Assessment & Plan Note (Signed)
39 yo re evalk and address hot flashes also, referred

## 2019-02-12 NOTE — Assessment & Plan Note (Signed)
Asked:confirms currently smokes cigarettes Assess: willing to quit and  cutting back, quit date set for 03/05/2019 Advise: needs to QUIT to reduce risk of cancer, cardio and cerebrovascular disease Assist: counseled for 5 minutes and literature provided Arrange: follow up in 3 months

## 2019-02-12 NOTE — Assessment & Plan Note (Signed)
Controlled, no change in medication  

## 2019-02-12 NOTE — Assessment & Plan Note (Signed)
Refer for imaging for comparison as not the current cause of concern

## 2019-02-13 DIAGNOSIS — D863 Sarcoidosis of skin: Secondary | ICD-10-CM | POA: Diagnosis not present

## 2019-02-13 DIAGNOSIS — Z79899 Other long term (current) drug therapy: Secondary | ICD-10-CM | POA: Diagnosis not present

## 2019-02-13 DIAGNOSIS — L920 Granuloma annulare: Secondary | ICD-10-CM | POA: Diagnosis not present

## 2019-02-13 DIAGNOSIS — Z5181 Encounter for therapeutic drug level monitoring: Secondary | ICD-10-CM | POA: Diagnosis not present

## 2019-02-14 DIAGNOSIS — G518 Other disorders of facial nerve: Secondary | ICD-10-CM | POA: Diagnosis not present

## 2019-02-14 DIAGNOSIS — G43719 Chronic migraine without aura, intractable, without status migrainosus: Secondary | ICD-10-CM | POA: Diagnosis not present

## 2019-02-14 DIAGNOSIS — M542 Cervicalgia: Secondary | ICD-10-CM | POA: Diagnosis not present

## 2019-02-14 DIAGNOSIS — M791 Myalgia, unspecified site: Secondary | ICD-10-CM | POA: Diagnosis not present

## 2019-02-21 ENCOUNTER — Ambulatory Visit (HOSPITAL_COMMUNITY)
Admission: RE | Admit: 2019-02-21 | Discharge: 2019-02-21 | Disposition: A | Discharge status: Home / Self Care | Payer: Medicare HMO | Source: Ambulatory Visit | Attending: Family Medicine | Admitting: Family Medicine

## 2019-02-21 ENCOUNTER — Other Ambulatory Visit: Payer: Self-pay

## 2019-02-21 ENCOUNTER — Ambulatory Visit (HOSPITAL_COMMUNITY): Payer: Medicare HMO

## 2019-02-21 ENCOUNTER — Encounter (HOSPITAL_COMMUNITY): Payer: Medicare HMO

## 2019-02-21 DIAGNOSIS — N644 Mastodynia: Secondary | ICD-10-CM | POA: Insufficient documentation

## 2019-02-21 DIAGNOSIS — R922 Inconclusive mammogram: Secondary | ICD-10-CM | POA: Diagnosis not present

## 2019-02-21 DIAGNOSIS — N63 Unspecified lump in unspecified breast: Secondary | ICD-10-CM

## 2019-02-23 DIAGNOSIS — H5213 Myopia, bilateral: Secondary | ICD-10-CM | POA: Diagnosis not present

## 2019-02-23 DIAGNOSIS — Z0100 Encounter for examination of eyes and vision without abnormal findings: Secondary | ICD-10-CM | POA: Diagnosis not present

## 2019-02-27 ENCOUNTER — Other Ambulatory Visit: Payer: Self-pay | Admitting: Dermatology

## 2019-02-27 DIAGNOSIS — D2339 Other benign neoplasm of skin of other parts of face: Secondary | ICD-10-CM | POA: Diagnosis not present

## 2019-02-27 DIAGNOSIS — D485 Neoplasm of uncertain behavior of skin: Secondary | ICD-10-CM | POA: Diagnosis not present

## 2019-03-05 ENCOUNTER — Other Ambulatory Visit: Payer: Self-pay | Admitting: Family Medicine

## 2019-03-07 ENCOUNTER — Other Ambulatory Visit: Payer: Self-pay

## 2019-03-07 ENCOUNTER — Other Ambulatory Visit (HOSPITAL_COMMUNITY)
Admission: RE | Admit: 2019-03-07 | Discharge: 2019-03-07 | Disposition: A | Discharge status: Home / Self Care | Payer: Medicare HMO | Source: Ambulatory Visit | Attending: Adult Health | Admitting: Adult Health

## 2019-03-07 ENCOUNTER — Ambulatory Visit (INDEPENDENT_AMBULATORY_CARE_PROVIDER_SITE_OTHER): Payer: Medicare HMO | Admitting: Adult Health

## 2019-03-07 ENCOUNTER — Encounter: Payer: Self-pay | Admitting: Adult Health

## 2019-03-07 ENCOUNTER — Other Ambulatory Visit (HOSPITAL_COMMUNITY): Payer: Self-pay | Admitting: Adult Health

## 2019-03-07 VITALS — BP 103/63 | HR 64 | Ht 66.0 in | Wt 153.0 lb

## 2019-03-07 DIAGNOSIS — Z1151 Encounter for screening for human papillomavirus (HPV): Secondary | ICD-10-CM | POA: Diagnosis not present

## 2019-03-07 DIAGNOSIS — R87810 Cervical high risk human papillomavirus (HPV) DNA test positive: Secondary | ICD-10-CM | POA: Diagnosis not present

## 2019-03-07 DIAGNOSIS — R52 Pain, unspecified: Secondary | ICD-10-CM

## 2019-03-07 DIAGNOSIS — Z1211 Encounter for screening for malignant neoplasm of colon: Secondary | ICD-10-CM

## 2019-03-07 DIAGNOSIS — Z01419 Encounter for gynecological examination (general) (routine) without abnormal findings: Secondary | ICD-10-CM

## 2019-03-07 DIAGNOSIS — Z1212 Encounter for screening for malignant neoplasm of rectum: Secondary | ICD-10-CM

## 2019-03-07 DIAGNOSIS — Z124 Encounter for screening for malignant neoplasm of cervix: Secondary | ICD-10-CM | POA: Diagnosis not present

## 2019-03-07 DIAGNOSIS — R87610 Atypical squamous cells of undetermined significance on cytologic smear of cervix (ASC-US): Secondary | ICD-10-CM | POA: Insufficient documentation

## 2019-03-07 DIAGNOSIS — N6312 Unspecified lump in the right breast, upper inner quadrant: Secondary | ICD-10-CM | POA: Diagnosis not present

## 2019-03-07 DIAGNOSIS — N644 Mastodynia: Secondary | ICD-10-CM | POA: Diagnosis not present

## 2019-03-07 DIAGNOSIS — Z780 Asymptomatic menopausal state: Secondary | ICD-10-CM | POA: Diagnosis not present

## 2019-03-07 DIAGNOSIS — N6310 Unspecified lump in the right breast, unspecified quadrant: Secondary | ICD-10-CM

## 2019-03-07 LAB — HEMOCCULT GUIAC POC 1CARD (OFFICE): Fecal Occult Blood, POC: NEGATIVE

## 2019-03-07 NOTE — Progress Notes (Signed)
Patient ID: Tracey Daniel, female   DOB: January 15, 1981, 39 y.o.   MRN: [id number] History of Present Illness: Tracey Daniel is a 39 year old black female,singel, G0P0, PM,in for well woman gyn exam and pap.She is complaining of tenderness in right breast for 2 months and ?mass. Had left breast mammogram and [place] 02/21/19 has lymph node. Last pap 05/25/17 negative for malignancy but +HPV.  PCP is Dr Moshe Cipro.   Current Medications, Allergies, Past Medical History, Past Surgical History, Family History and Social History were reviewed in [employer] record.     Review of Systems: Patient denies any headaches, hearing loss, fatigue, blurred vision, shortness of breath, chest pain, abdominal pain, problems with bowel movements(has IBS), urination, or intercourse. No joint pain or mood swings. + hot flashes, she is on HRT from PCP    Physical Exam:BP 103/63 (BP Location: Left Arm, Patient Position: Sitting, Cuff Size: Normal)   Pulse 64   Ht 5\' 6"  (1.676 m)   Wt 153 lb (69.4 kg)   LMP  (LMP Unknown) Comment: pt reports premature menopause  BMI 24.69 kg/m  General:  Well developed, well nourished, no acute distress Skin:  Warm and dry, has multiple areas of skin discoloration has GA she says  Neck:  Midline trachea, normal thyroid, good ROM, no lymphadenopathy Lungs; Clear to auscultation bilaterally Breast:  No dominant palpable mass, retraction, or nipple discharge on left, on the right no retraction or nipple discharge, has right mass at 12 0' clock to under nipple, and is tender Cardiovascular: Regular rate and rhythm Abdomen:  Soft, non tender, no hepatosplenomegaly Pelvic:  External genitalia is normal in appearance, no lesions.  The vagina is normal in appearance. Urethra has no lesions or masses. The cervix is smooth and nulliparous, with stenotic os, pap with GC/CHL and high risk HPV 16/18 genotyping performed.  Uterus is felt to be normal size, shape, and contour.  No adnexal  masses or tenderness noted.Bladder is non tender, no masses felt. Rectal: Good sphincter tone, no polyps, or hemorrhoids felt.  Hemoccult negative. Extremities/musculoskeletal:  No swelling or varicosities noted, no clubbing or cyanosis Psych:  No mood changes, alert and cooperative,seems happy Fall risk is low PHQ 2 score is 0. Examination chaperoned by Terri Skains NP who assisted with exam.  Impression and Plan: 1. Routine cervical smear Pap sent  2. Encounter for gynecological examination with Papanicolaou smear of cervix Pap sent  Physical in 1 year Pap in 3 if normal Labs with PCP  3. Screening for colorectal cancer   4. Mass of upper inner quadrant of right breast Right breast mammogram and [place] 3/2 at [facility] 3/2 at 2 pm  5. Breast pain, right   6. Papanicolaou smear of cervix with positive high risk human papilloma virus (HPV) test Pap sent

## 2019-03-08 ENCOUNTER — Telehealth: Payer: Self-pay | Admitting: *Deleted

## 2019-03-08 NOTE — Telephone Encounter (Signed)
Spoke with patient and read her the report. She verbalized understanding.

## 2019-03-08 NOTE — Telephone Encounter (Signed)
Pt wanted to know recent mammo results as she had not heard anything

## 2019-03-13 LAB — CYTOLOGY - PAP
Chlamydia: NEGATIVE
Comment: NEGATIVE
Comment: NEGATIVE
Comment: NEGATIVE
Comment: NEGATIVE
Comment: NORMAL
Diagnosis: UNDETERMINED — AB
HPV 16: NEGATIVE
HPV 18 / 45: NEGATIVE
High risk HPV: POSITIVE — AB
Neisseria Gonorrhea: NEGATIVE

## 2019-03-14 ENCOUNTER — Other Ambulatory Visit: Payer: Self-pay | Admitting: Family Medicine

## 2019-03-21 ENCOUNTER — Ambulatory Visit (HOSPITAL_COMMUNITY)
Admission: RE | Admit: 2019-03-21 | Discharge: 2019-03-21 | Disposition: A | Discharge status: Home / Self Care | Payer: Medicare HMO | Source: Ambulatory Visit | Attending: Adult Health | Admitting: Adult Health

## 2019-03-21 ENCOUNTER — Other Ambulatory Visit: Payer: Self-pay

## 2019-03-21 DIAGNOSIS — N6310 Unspecified lump in the right breast, unspecified quadrant: Secondary | ICD-10-CM | POA: Insufficient documentation

## 2019-03-21 DIAGNOSIS — N6312 Unspecified lump in the right breast, upper inner quadrant: Secondary | ICD-10-CM

## 2019-03-21 DIAGNOSIS — N644 Mastodynia: Secondary | ICD-10-CM | POA: Insufficient documentation

## 2019-03-21 DIAGNOSIS — R52 Pain, unspecified: Secondary | ICD-10-CM | POA: Insufficient documentation

## 2019-03-21 DIAGNOSIS — R922 Inconclusive mammogram: Secondary | ICD-10-CM | POA: Diagnosis not present

## 2019-03-28 DIAGNOSIS — G43719 Chronic migraine without aura, intractable, without status migrainosus: Secondary | ICD-10-CM | POA: Diagnosis not present

## 2019-03-28 DIAGNOSIS — M542 Cervicalgia: Secondary | ICD-10-CM | POA: Diagnosis not present

## 2019-03-28 DIAGNOSIS — M791 Myalgia, unspecified site: Secondary | ICD-10-CM | POA: Diagnosis not present

## 2019-03-28 DIAGNOSIS — G518 Other disorders of facial nerve: Secondary | ICD-10-CM | POA: Diagnosis not present

## 2019-03-31 DIAGNOSIS — F333 Major depressive disorder, recurrent, severe with psychotic symptoms: Secondary | ICD-10-CM | POA: Diagnosis not present

## 2019-04-18 ENCOUNTER — Other Ambulatory Visit: Payer: Self-pay | Admitting: Obstetrics and Gynecology

## 2019-04-26 ENCOUNTER — Emergency Department (HOSPITAL_COMMUNITY)
Admission: EM | Admit: 2019-04-26 | Discharge: 2019-04-27 | Disposition: A | Discharge status: Home / Self Care | Payer: Medicare HMO | Attending: Emergency Medicine | Admitting: Emergency Medicine

## 2019-04-26 ENCOUNTER — Emergency Department (HOSPITAL_COMMUNITY): Payer: Medicare HMO

## 2019-04-26 ENCOUNTER — Other Ambulatory Visit: Payer: Self-pay

## 2019-04-26 ENCOUNTER — Encounter (HOSPITAL_COMMUNITY): Payer: Self-pay | Admitting: Pediatrics

## 2019-04-26 DIAGNOSIS — F1290 Cannabis use, unspecified, uncomplicated: Secondary | ICD-10-CM | POA: Diagnosis not present

## 2019-04-26 DIAGNOSIS — F17210 Nicotine dependence, cigarettes, uncomplicated: Secondary | ICD-10-CM | POA: Diagnosis not present

## 2019-04-26 DIAGNOSIS — J449 Chronic obstructive pulmonary disease, unspecified: Secondary | ICD-10-CM | POA: Diagnosis not present

## 2019-04-26 DIAGNOSIS — Z79899 Other long term (current) drug therapy: Secondary | ICD-10-CM | POA: Diagnosis not present

## 2019-04-26 DIAGNOSIS — M542 Cervicalgia: Secondary | ICD-10-CM | POA: Insufficient documentation

## 2019-04-26 DIAGNOSIS — R0789 Other chest pain: Secondary | ICD-10-CM | POA: Diagnosis not present

## 2019-04-26 DIAGNOSIS — Z8709 Personal history of other diseases of the respiratory system: Secondary | ICD-10-CM | POA: Diagnosis not present

## 2019-04-26 DIAGNOSIS — R079 Chest pain, unspecified: Secondary | ICD-10-CM | POA: Diagnosis not present

## 2019-04-26 LAB — CBC
HCT: 32.9 % — ABNORMAL LOW (ref 36.0–46.0)
Hemoglobin: 10.7 g/dL — ABNORMAL LOW (ref 12.0–15.0)
MCH: 31 pg (ref 26.0–34.0)
MCHC: 32.5 g/dL (ref 30.0–36.0)
MCV: 95.4 fL (ref 80.0–100.0)
Platelets: 220 10*3/uL (ref 150–400)
RBC: 3.45 MIL/uL — ABNORMAL LOW (ref 3.87–5.11)
RDW: 13.7 % (ref 11.5–15.5)
WBC: 11.1 10*3/uL — ABNORMAL HIGH (ref 4.0–10.5)
nRBC: 0 % (ref 0.0–0.2)

## 2019-04-26 LAB — BASIC METABOLIC PANEL
Anion gap: 7 (ref 5–15)
BUN: 17 mg/dL (ref 6–20)
CO2: 22 mmol/L (ref 22–32)
Calcium: 9.4 mg/dL (ref 8.9–10.3)
Chloride: 107 mmol/L (ref 98–111)
Creatinine, Ser: 0.89 mg/dL (ref 0.44–1.00)
GFR calc Af Amer: >60 mL/min (ref >60)
GFR calc non Af Amer: >60 mL/min (ref >60)
Glucose, Bld: 101 mg/dL — ABNORMAL HIGH (ref 70–99)
Potassium: 4.1 mmol/L (ref 3.5–5.1)
Sodium: 136 mmol/L (ref 135–145)

## 2019-04-26 LAB — I-STAT BETA HCG BLOOD, ED (MC, WL, AP ONLY): I-stat hCG, quantitative: <5 m[IU]/mL (ref <5)

## 2019-04-26 LAB — TROPONIN I (HIGH SENSITIVITY)
Troponin I (High Sensitivity): 3 ng/L (ref <18)
Troponin I (High Sensitivity): 3 ng/L (ref <18)

## 2019-04-26 MED ORDER — SODIUM CHLORIDE 0.9% FLUSH: 3.0000 mL | Freq: Once | INTRAVENOUS | Status: DC

## 2019-04-26 NOTE — ED Triage Notes (Signed)
Patient c/o neck pain since 3/28; c/o chest pain since last week as well. Pt reports she gets shots on her head for migraines

## 2019-04-27 DIAGNOSIS — M542 Cervicalgia: Secondary | ICD-10-CM | POA: Diagnosis not present

## 2019-04-27 MED ORDER — DIAZEPAM 5 MG PO TABS
5.0000 mg | ORAL_TABLET | Freq: Once | ORAL | Status: AC
  Administered 2019-04-27: 5 mg via ORAL
  Filled 2019-04-27: qty 1

## 2019-04-27 MED ORDER — DIAZEPAM 5 MG PO TABS: 5.0000 mg | ORAL_TABLET | Freq: Two times a day (BID) | ORAL | 0 refills | Status: DC | PRN

## 2019-04-27 MED ORDER — KETOROLAC TROMETHAMINE 30 MG/ML IJ SOLN
60.0000 mg | Freq: Once | INTRAMUSCULAR | Status: AC
  Administered 2019-04-27: 02:00:00 60 mg via INTRAMUSCULAR
  Filled 2019-04-27: qty 2

## 2019-04-27 MED ORDER — KETOROLAC TROMETHAMINE 10 MG PO TABS: 10.0000 mg | ORAL_TABLET | Freq: Three times a day (TID) | ORAL | 0 refills | Status: DC | PRN

## 2019-04-27 NOTE — Discharge Instructions (Addendum)
Take the prescribed medication as directed.  Do not take these medicines and the diclofenac/zanaflex combination, only one or the other.  Can continue using heat on the neck and back for added relief. Follow-up with your primary care doctor. Return to the ED for new or worsening symptoms.

## 2019-04-27 NOTE — ED Provider Notes (Signed)
[facility] EMERGENCY DEPARTMENT Provider Note   CSN: [id number] Arrival date & time: 04/26/19  1853     History Chief Complaint  Patient presents with  . Neck Pain  . Chest Pain    Tracey Daniel is a 39 y.o. female.  The history is provided by the patient and medical records.  Neck Pain Associated symptoms: chest pain   Chest Pain   39 year old female with history of anemia, asthma, chronic back pain, COPD, depression, gastroparesis, seasonal allergies, history of substance abuse, presenting to the ED with right-sided back and chest pain.  States she started having pain in the right side of her neck and upper back on 04/16/2019.  She thinks she may have slept wrong.  States since that time pain now seems to encompass the right side of her chest as well.  She has not noticed any palpitations, diaphoresis, shortness of breath, nausea, or vomiting.  No feelings of syncope.  States as long as she is laying down and sitting still her pain dissipates, but begins again with any type of change in position or movement.  She did get injections at the base of her skull for migraines, she last had injection on 04/13/2019.  No complications from this.  She has not noticed any fever.  States she is chronically on diclofenac as well as Zanaflex but has not had any relief.  Past Medical History:  Diagnosis Date  . Anemia of other chronic disease 11/29/2012  . Asthma   . Back pain, chronic   . Chronic abdominal pain   . Constipation   . COPD (chronic obstructive pulmonary disease) ([place])   . Cyst of skin    mid spine area  . Depression 2000   h/o suicidal ideation  . Gastric outlet obstruction   . Gastritis   . Gastroparesis   . Migraine   . Migraines   . Nausea and vomiting    recurrent  . Nicotine addiction   . Osteoporosis   . Peptic ulcer disease   . Pyloric spasm 03/30/2011  . Seasonal allergies   . Sinusitis   . Substance abuse ([place]) 2008   marijuana  .  Vitamin D deficiency     Patient Active Problem List   Diagnosis Date Noted  . Encounter for gynecological examination with Papanicolaou smear of cervix 03/07/2019  . Routine cervical smear 03/07/2019  . Screening for colorectal cancer 03/07/2019  . Mass of upper inner quadrant of right breast 03/07/2019  . Papanicolaou smear of cervix with positive high risk human papilloma virus (HPV) test 03/07/2019  . Abnormal Pap smear of cervix 02/12/2019  . Breast mass in female 02/07/2019  . Poor appetite 09/08/2018  . Abdominal pain 05/31/2018  . Leukocytosis 05/31/2018  . Tobacco abuse 05/31/2018  . Cannabinoid hyperemesis syndrome 03/11/2018  . Intractable abdominal pain 03/07/2018  . Dermatitis 02/01/2018  . Hemorrhoids 02/22/2017  . Nicotine dependence, cigarettes, with other nicotine-induced disorders 12/12/2016  . Thoracic spine pain 11/23/2016  . Nasal obstruction 11/23/2016  . Breast pain, right 03/23/2016  . Hyperlipemia 10/02/2015  . Premature ovarian failure 02/04/2015  . Pregnancy as incidental finding, low positive preg test, considered pituitary production of HCG 02/04/2015  . Need for influenza vaccination 09/30/2014  . Abdominal pain, epigastric 08/27/2014  . Vitamin D deficiency 05/16/2014  . Alopecia 08/01/2013  . Asthma, cough variant 12/01/2012  . 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 Marijuana use 11/14/2012  . Seasonal allergies 11/14/2012  .  Nausea without vomiting 11/09/2012  . Constipation 12/02/2011  . Gastroparesis 05/01/2011  . Pyloric spasm/Stenosis 03/30/2011  . Hip pain, right 03/02/2011  . Anemia 01/30/2011  . Gastric outlet obstruction 01/01/2011  . Intractable vomiting 12/24/2010  . Hypokalemia 12/24/2010  . COPD (chronic obstructive pulmonary disease) ([place]) 09/09/2010  . GERD (gastroesophageal reflux disease) 08/21/2010  . Headache 03/13/2008  . AMENORRHEA, SECONDARY 09/27/2007  . ECZEMA 09/27/2007  . Depression  07/01/2007  . Back pain with radiation 07/01/2007  . CLOSED FRACTURE OF METATARSAL BONE 01/27/2007    Past Surgical History:  Procedure Laterality Date  . BALLOON DILATION N/A 02/09/2013   Procedure: BALLOON DILATION;  Surgeon: Missy Sabins, MD;  Location: [facility];  Service: Endoscopy;  Laterality: N/A;  . BALLOON DILATION N/A 03/10/2018   Procedure: BALLOON DILATION;  Surgeon: Daneil Dolin, MD;  [facility];  Service: Endoscopy;  Laterality: N/A;  . BILIARY DILATION  06/01/2018   Procedure: PYLORIC DILATION;  Surgeon: Rush Landmark Telford Nab., MD;  Location: [facility];  Service: Gastroenterology;;  . BIOPSY  06/01/2018   Procedure: BIOPSY;  Surgeon: Irving Copas., MD;  Location: [facility] ENDOSCOPY;  Service: Gastroenterology;;  . Lum Keas INJECTION N/A 02/09/2013   Procedure: BOTOX INJECTION;  Surgeon: Missy Sabins, MD;  Location: [facility];  Service: Endoscopy;  Laterality: N/A;  possible balloon  . BOTOX INJECTION N/A 10/24/2015   Procedure: BOTOX INJECTION;  Surgeon: Daneil Dolin, MD;  [facility];  Service: Endoscopy;  Laterality: N/A;  . BOTOX INJECTION N/A 03/10/2018   Procedure: BOTOX INJECTION;  Surgeon: Daneil Dolin, MD;  [facility];  Service: Endoscopy;  Laterality: N/A;  Melanie notified  . BOTOX INJECTION  06/01/2018   Procedure: BOTOX INJECTION;  Surgeon: Rush Landmark Telford Nab., MD;  Location: [facility];  Service: Gastroenterology;;  . CARPAL TUNNEL RELEASE     left hand  . COLONOSCOPY WITH PROPOFOL N/A 09/20/2014   RMR: Normal ileo-colonoscopy  . ESOPHAGEAL DILATION  12/25/2010   Procedure: ESOPHAGEAL DILATION;  Surgeon: Dorothyann Peng, MD;  [facility];  Service: Endoscopy;;  . ESOPHAGEAL MANOMETRY N/A 10/03/2018   Procedure: ESOPHAGEAL MANOMETRY (EM);  Surgeon: Mauri Pole, MD;  Location: [facility];  Service: Endoscopy;  Laterality: N/A;  . ESOPHAGOGASTRODUODENOSCOPY  12/25/2010   Dorothyann Peng,  MD;  moderate gastritis, ?goo secondary to pylorspasm. BX showed reactive gstropathy no h.pyori, SB mucosa with intramucosal lymphocytosis and partial villous blunting (TTG 4.0 normal)  . ESOPHAGOGASTRODUODENOSCOPY N/A 11/14/2012   [id number] hiatal hernia. Abnormal gastric mucosa of  uncertain significance-status post biopsy. Subjectively, patient may have recurrent symptomatic, pylorospasm.  . ESOPHAGOGASTRODUODENOSCOPY (EGD) WITH PROPOFOL N/A 02/09/2013   elongated stomach, partial lower esophageal ring widely patent. No obvious pyloric stenosis s/p Botox  . ESOPHAGOGASTRODUODENOSCOPY (EGD) WITH PROPOFOL N/A 10/24/2015   Procedure: ESOPHAGOGASTRODUODENOSCOPY (EGD) WITH PROPOFOL;  Surgeon: Daneil Dolin, MD;  [facility];  Service: Endoscopy;  Laterality: N/A;  830   . ESOPHAGOGASTRODUODENOSCOPY (EGD) WITH PROPOFOL N/A 03/10/2018   Dr. Gala Romney: normal esophagus, elongated, dilated stomach likely stigmata of slow gastric emptying due to narrowing of pyloric channel, s/p balloon dilatation of pyloric channel. Small hiatal hernia, normal duodenal bulb and second portion of duodenum  . ESOPHAGOGASTRODUODENOSCOPY (EGD) WITH PROPOFOL N/A 06/01/2018   Procedure: ESOPHAGOGASTRODUODENOSCOPY (EGD) WITH PROPOFOL;  Surgeon: Rush Landmark Telford Nab., MD;  Location: [facility];  Service: Gastroenterology;  Laterality: N/A;  . LAPAROSCOPIC CHOLECYSTECTOMY  2002   [facility] Na?  [place]?  . laser surgery on cervix    . NASAL SEPTUM SURGERY  01/29/2017  . TOE SURGERY       OB History    Gravida  0   Para      Term      Preterm      AB      Living        SAB      TAB      Ectopic      Multiple      Live Births              Family History  Problem Relation Age of Onset  . Diabetes Father   . Liver disease Father        liver transplant at [facility], age 51  . Lung cancer Mother 44  . Heart disease Maternal Grandmother   . Parkinson's disease Maternal Grandfather   . Multiple  sclerosis Sister 53  . Depression Sister 49  . Alcohol abuse Sister   . Bipolar disorder Sister   . Schizophrenia Sister   . Colon cancer Neg Hx     Social History   Tobacco Use  . Smoking status: Current Every Day Smoker    Packs/day: 0.10    Years: 15.00    Pack years: 1.50    Types: Cigarettes  . Smokeless tobacco: Never Used  . Tobacco comment: 2 per day  Substance Use Topics  . Alcohol use: No    Alcohol/week: 0.0 standard drinks  . Drug use: Yes    Frequency: 2.0 times per week    Types: Marijuana    Comment: trying to quit    Home Medications Prior to Admission medications   Medication Sig Start Date End Date Taking? Authorizing Provider  acetaminophen (TYLENOL) 325 MG tablet Take 2 tablets (650 mg total) by mouth every 6 (six) hours as needed for mild pain (or Fever >/= 101). 03/11/18   Emokpae, Courage, MD  azelastine (ASTELIN) 0.1 % nasal spray USE 2 SPRAYS IN EACH NOSTRIL TWICE DAILY AS DIRECTED 03/14/19   [name], MD  budesonide-formoterol (SYMBICORT) 80-4.5 MCG/ACT inhaler Inhale 2 puffs into the lungs 2 (two) times daily. 09/08/18   [name], MD  diclofenac (VOLTAREN) 75 MG EC tablet TAKE 1 TABLET(75 MG) BY MOUTH TWICE DAILY 08/19/18   Mcarthur Rossetti, MD  estradiol (ESTRACE) 0.1 MG/GM vaginal cream INSERT [id number] APPLICATORFUL VAGINALLY THREE TIMES WEEKLY AS DIRECTED 01/22/19   Jonnie Kind, MD  estradiol (ESTRACE) 1 MG tablet TAKE 1 TABLET(1 MG) BY MOUTH DAILY 10/20/18   Jonnie Kind, MD  feeding supplement (ENSURE CLINICAL STRENGTH) LIQD Take 237 mLs by mouth 3 (three) times daily with meals. Patient taking differently: Take 237 mLs by mouth daily.  01/01/11   Dhungel, Flonnie Overman, MD  fluticasone (FLONASE) 50 MCG/ACT nasal spray SHAKE LIQUID AND USE 2 SPRAYS IN EACH NOSTRIL DAILY 11/29/18   [name], MD  gabapentin (NEURONTIN) 100 MG capsule Take 1 capsule (100 mg total) by mouth 3 (three) times daily. 01/24/19   [name], MD  hydrocortisone 2.5 % ointment  11/01/18   [provider]  hydroxychloroquine (PLAQUENIL) 200 MG tablet Take 200 mg by mouth daily. 06/08/18   [provider]  linaclotide Rolan Lipa) 290 MCG CAPS capsule TAKE 1 CAPSULE(290 MCG) BY MOUTH DAILY 08/19/18   Mahala Menghini, PA-C  megestrol (MEGACE) 20 MG tablet TAKE 1 TABLET(20 MG) BY MOUTH DAILY 01/23/19  [name], MD  metoCLOPramide (REGLAN) 10 MG tablet Take 1 tablet (10 mg total) by mouth every 8 (eight) hours as needed for nausea. 01/11/19   [name], MD  ondansetron (ZOFRAN) 4 MG tablet Take 1 tablet (4 mg total) by mouth every 6 (six) hours as needed for nausea or vomiting. 02/07/19   [name], MD  pantoprazole (PROTONIX) 40 MG tablet Take 1 tablet (40 mg total) by mouth 2 (two) times daily before a meal. 08/19/18   Mahala Menghini, PA-C  potassium chloride SA (K-DUR) 20 MEQ tablet TAKE 1 TABLET BY MOUTH EVERY DAY 08/22/18   [name], MD  pravastatin (PRAVACHOL) 20 MG tablet TAKE 1 TABLET(20 MG) BY MOUTH DAILY 10/20/18   [name], MD  progesterone (PROMETRIUM) 200 MG capsule TAKE 1 CAPSULE BY MOUTH EVERY DAY FOR 2 WEEKS EVERY THIRD MONTH: Quentin Mulling AND OCTOBER 04/19/19   Jonnie Kind, MD  propranolol (INDERAL) 10 MG tablet Take 1 tablet (10 mg total) by mouth 3 (three) times daily. 03/11/18   Roxan Hockey, MD  risperiDONE (RISPERDAL) 0.5 MG tablet Take 0.5 mg at bedtime by mouth.    [provider]  sertraline (ZOLOFT) 50 MG tablet Take 50 mg by mouth at bedtime.     [provider]  sucralfate (CARAFATE) 1 g tablet One tablet every morning and at bedtime for stomach burning. 08/26/18   Mahala Menghini, PA-C  tiZANidine (ZANAFLEX) 4 MG tablet TAKE 1 TABLET(4 MG) BY MOUTH BACK TWICE DAILY AS NEEDED FOR SPASMS 03/06/19   [name], MD  VENTOLIN HFA 108 [id number] Base) MCG/ACT inhaler INHALE 2 PUFFS INTO THE LUNGS EVERY 6 HOURS AS  NEEDED FOR SHORTNESS OF BREATH 09/21/18   [name], MD  zonisamide (ZONEGRAN) 50 MG capsule Take 150 mg by mouth at bedtime.  02/21/17   [provider]    Allergies    Benadryl [diphenhydramine], Penicillins, and Latex  Review of Systems   Review of Systems  Cardiovascular: Positive for chest pain.  Musculoskeletal: Positive for neck pain.  All other systems reviewed and are negative.   Physical Exam Updated Vital Signs BP (!) 116/50 (BP Location: Right Arm)   Pulse 69   Temp 98.8 F (37.1 C) (Oral)   Resp 16   Ht 5\' 6"  (1.676 m)   Wt 68 kg   LMP  (LMP Unknown) Comment: pt reports premature menopause  SpO2 100%   BMI 24.21 kg/m   Physical Exam Vitals and nursing note reviewed.  Constitutional:      Appearance: She is well-developed.  HENT:     Head: Normocephalic and atraumatic.  Eyes:     Conjunctiva/sclera: Conjunctivae normal.     Pupils: Pupils are equal, round, and reactive to light.  Cardiovascular:     Rate and Rhythm: Normal rate and regular rhythm.     Heart sounds: Normal heart sounds.  Pulmonary:     Effort: Pulmonary effort is normal.     Breath sounds: Normal breath sounds.  Abdominal:     General: Bowel sounds are normal.     Palpations: Abdomen is soft.  Musculoskeletal:        General: Normal range of motion.     Cervical back: Normal range of motion.     Comments: Tenderness and noted spasm along right cervical paraspinal musculature extending into the right trapezius and down into right chest wall; pain is reproducible with palpation; normal grip  strength, distal sensation and perfusion throughout both arms  Skin:    General: Skin is warm and dry.  Neurological:     Mental Status: She is alert and oriented to person, place, and time.     ED Results / Procedures / Treatments   Labs (all labs ordered are listed, but only abnormal results are displayed) Labs Reviewed  BASIC METABOLIC PANEL - Abnormal; Notable for the  following components:      Result Value   Glucose, Bld 101 (*)    All other components within normal limits  CBC - Abnormal; Notable for the following components:   WBC 11.1 (*)    RBC 3.45 (*)    Hemoglobin 10.7 (*)    HCT 32.9 (*)    All other components within normal limits  I-STAT BETA HCG BLOOD, ED ([facility], [facility], [facility] ONLY)  TROPONIN I (HIGH SENSITIVITY)  TROPONIN I (HIGH SENSITIVITY)    EKG None  Radiology DG Chest 2 View  Result Date: 04/26/2019 CLINICAL DATA:  39 year old female with chest pain. EXAM: CHEST - 2 VIEW COMPARISON:  Chest radiograph dated 06/01/2018. FINDINGS: The heart size and mediastinal contours are within normal limits. Both lungs are clear. The visualized skeletal structures are unremarkable. IMPRESSION: No active cardiopulmonary disease. Electronically Signed   By: Anner Crete M.D.   On: 04/26/2019 19:33    Procedures Procedures (including critical care time)  Medications Ordered in ED Medications  sodium chloride flush (NS) 0.9 % injection 3 mL (has no administration in time range)  ketorolac (TORADOL) 30 MG/ML injection 60 mg (60 mg Intramuscular Given 04/27/19 0217)  diazepam (VALIUM) tablet 5 mg (5 mg Oral Given 04/27/19 0217)    ED Course  I have reviewed the triage vital signs and the nursing notes.  Pertinent labs & imaging results that were available during my care of the patient were reviewed by me and considered in my medical decision making (see chart for details).    MDM Rules/Calculators/A&P    39 year old female presenting to the ED with right-sided neck and chest pain.  Neck pain started on 04/16/2019, thinks she "slept wrong".  Pain has since radiated down into her chest and right shoulder.  Pain is better with sitting still, worse with movement.  She is on diclofenac and Zanaflex at home but has not had any relief with this.  She is afebrile and nontoxic.  She does have muscular tenderness of the right cervical paraspinal musculature  extending into the trapezius.  There is noted spasm.  Pain with abduction of right shoulder and chest wall is locally tender.  No signs of trauma.  Lungs are clear bilaterally.  EKG is nonischemic.  Labs reassuring including troponin x2.  Chest x-ray is clear.  This seems to be musculoskeletal.  Will provide symptomatic treatment here and reassess.  3:05 AM patient feeling better after medications here.  States her neck and shoulder finally feel like they are relaxed.  VSS.    Suspect this is all MSK in nature, much less likely ACS, PE, dissection, or other acute cardiac event.  Feel she is stable for discharge home.  She is open to changing up her home regimen for a few days for better relief.  She understands to only take 1 set of the medicines, not both.  Will need to follow-up with her primary care doctor. Final Clinical Impression(s) / ED Diagnoses Final diagnoses:  Neck pain  Chest wall pain    Rx / DC Orders ED  Discharge Orders         Ordered    ketorolac (TORADOL) 10 MG tablet  Every 8 hours PRN     04/27/19 0310    diazepam (VALIUM) 5 MG tablet  2 times daily PRN     04/27/19 0310           Larene Pickett, PA-C 04/27/19 0330    Mesner, Corene Cornea, MD 04/27/19 [phone]

## 2019-04-30 ENCOUNTER — Other Ambulatory Visit: Payer: Self-pay | Admitting: Family Medicine

## 2019-05-02 ENCOUNTER — Ambulatory Visit (INDEPENDENT_AMBULATORY_CARE_PROVIDER_SITE_OTHER): Payer: Medicare HMO | Admitting: Nurse Practitioner

## 2019-05-02 ENCOUNTER — Ambulatory Visit (INDEPENDENT_AMBULATORY_CARE_PROVIDER_SITE_OTHER): Payer: Medicare HMO | Admitting: Family Medicine

## 2019-05-02 ENCOUNTER — Other Ambulatory Visit: Payer: Self-pay

## 2019-05-02 ENCOUNTER — Encounter: Payer: Self-pay | Admitting: Family Medicine

## 2019-05-02 ENCOUNTER — Encounter: Payer: Self-pay | Admitting: Nurse Practitioner

## 2019-05-02 VITALS — BP 96/61 | HR 74 | Temp 97.1°F | Ht 66.0 in | Wt 156.8 lb

## 2019-05-02 VITALS — BP 106/65 | HR 73 | Temp 97.9°F | Ht 66.0 in | Wt 157.0 lb

## 2019-05-02 DIAGNOSIS — K311 Adult hypertrophic pyloric stenosis: Secondary | ICD-10-CM

## 2019-05-02 DIAGNOSIS — K3184 Gastroparesis: Secondary | ICD-10-CM

## 2019-05-02 DIAGNOSIS — G8929 Other chronic pain: Secondary | ICD-10-CM | POA: Diagnosis not present

## 2019-05-02 DIAGNOSIS — K219 Gastro-esophageal reflux disease without esophagitis: Secondary | ICD-10-CM | POA: Diagnosis not present

## 2019-05-02 DIAGNOSIS — M542 Cervicalgia: Secondary | ICD-10-CM

## 2019-05-02 DIAGNOSIS — K5900 Constipation, unspecified: Secondary | ICD-10-CM

## 2019-05-02 MED ORDER — METOCLOPRAMIDE HCL 10 MG PO TABS: 10.0000 mg | ORAL_TABLET | Freq: Three times a day (TID) | ORAL | 5 refills | Status: DC | PRN

## 2019-05-02 MED ORDER — LINACLOTIDE 145 MCG PO CAPS: 145.0000 ug | ORAL_CAPSULE | Freq: Every day | ORAL | 5 refills | Status: AC

## 2019-05-02 NOTE — Patient Instructions (Signed)
Your health issues we discussed today were:   Gastroparesis (slow emptying stomach), gastric outlet obstruction, both with nausea and vomiting: 1. I am glad you are doing well! 2. As discussed, I sent a refill of Reglan to your pharmacy that you can take 3 times a day. 3. Call us if you have any worsening nausea, vomiting that may require antinausea medicine such as Zofran (or other options if insurance not pay for it)  Constipation: 1. I am glad the lower dose of Linzess (145 mcg daily) is working for you 2. I have sent a refill to your pharmacy for [place].  Continue taking this medication 3. Let us know if your constipation becomes worse or severe or if you see any significant rectal bleeding  Overall I recommend:  1. Continue your other current medications 2. Return for follow-up in 6 months 3. Call us if you have any questions or concerns.   ---------------------------------------------------------------  COVID-19 Vaccine Information can be found at: [email or web address].uk For questions related to vaccine distribution or appointments, please email [email or web address] or call [phone].   ---------------------------------------------------------------   At [facility] we value your feedback. You may receive a survey about your visit today. Please share your experience as we strive to create trusting relationships with our patients to provide genuine, compassionate, quality care.  We appreciate your understanding and patience as we review any laboratory studies, imaging, and other diagnostic tests that are ordered as we care for you. Our office policy is 5 business days for review of these results, and any emergent or urgent results are addressed in a timely manner for your best interest. If you do not hear from our office in 1 week, please contact us.   We also encourage the use of MyChart, which contains  your medical information for your review as well. If you are not enrolled in this feature, an access code is on this after visit summary for your convenience. Thank you for allowing [place] to be involved in your care.  It was great to see you today!  I hope you have a great Summer!!

## 2019-05-02 NOTE — Progress Notes (Signed)
Referring Provider: [name], MD Primary Care Physician:  [name], MD Primary GI:  Dr. Gala Romney  Chief Complaint  Patient presents with  . Gastroesophageal Reflux    f/u  . Nausea    w/ vomiting. take her med daily and so it helps. Insurance not wanting to pay for zofran    HPI:   Tracey Daniel is a 39 y.o. female who presents for follow-up on GERD, nausea, vomiting.  Patient was last seen in our office 12/29/2018 for the same as well as constipation and abdominal pain.  Her last visit was a virtual office visit due to COVID-19/coronavirus pandemic.  Noted chronic history of GERD, gastric outlet obstruction, nausea and vomiting.  Also noted previous pyloric spasm/stenosis, cannabinoid hyperemesis syndrome.  Previous Botox injections into her pyloric valve.  Previous CT imaging during hospitalization in May 2020 was unremarkable and query role of possible surgical consult given a normal-appearing pyloric valve.  Most recent EGD 06/01/2018 with esophagus status post biopsy, small hiatal hernia, erythematous mucosa in the cardia, gastric fundus, and gastric body.  Erosive gastropathy status post biopsy, normal pylorus which was dilated and injected with Botox.  Duodenal mucosa status post biopsy.  Surgical pathology found duodenum, stomach, esophageal biopsies all essentially benign mucosa.  Recent gastric emptying study normal compared to previously completed in 2012 which was delayed.  Recommended esophageal manometry and discussion for options with the most aggressive option being fundoplication thyroplasty although other etiologies would like to be ruled out first.  Manometry was completed 10/03/2018 with findings of normal relaxation of the EG junction, no significant esophageal peristaltic abnormality detected.  At her last visit noted abdominal pain "once in a while" but ongoing for the past few weeks with left lower quadrant pain radiating to her back described  as crampy.  Still on Linzess 1-2 times a week with a bowel movement every 2 to 3 weeks.  Pain typically improves after bowel movement.  Noted hard stools and straining as well as incomplete emptying.  No hematochezia or melena.  GERD doing well on Protonix, nausea has improved on antiemetic and Carafate.  No other overt GI complaints.  Not sure if she is taking Reglan or not.  Recommended continue Protonix, small meals, softer foods, avoid triggers.  Reduce Linzess to 145 mcg and take daily to see if this helps with bowel movements and reduce loose stools.  Progress report in 2 weeks.  Follow-up in 4 months.  No progress report noted.  Today she states she's doing ok overall. If she takes her medication her symptoms remain well managed. However, states insurance is not wanting to cover Zofran. 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 She previously owed $1-2 for Zofran and at last attempted fill was about $119. Also out of Reglan and refill was somehow sent to Dr. Moshe Cipro. Otherwise she's doing well. Denies and ADEs to Reglan. On her medication she 's able to eat, keep down meds and fluids. She has rare/"Once in a while" abdominal discomfort if she gets too full, which generally resolves with time. No N/V if taking her medications. Generally she only needed Zofran if she was out of Reglan. Denies hematochezia, melena, fever, chills, unintentional weight loss. Constipation doing well on Linzess with rare breakthrough which is diet dependent. Has not received Rx for Linzess 145 mcg. Occasionally with difficulty passing gas. Still on Ensure but only once a day. GERD doing well on Protonix bid. Denies URI or flu-like symptoms.  Denies loss of sense of taste or smell. She has not had a COVID-19 vaccine but is interested in how to schedule one. Denies chest pain, dyspnea, dizziness, lightheadedness, syncope, near syncope. Denies any other upper or lower GI symptoms.  Past Medical History:  Diagnosis Date  .  Anemia of other chronic disease 11/29/2012  . Asthma   . Back pain, chronic   . Chronic abdominal pain   . Constipation   . COPD (chronic obstructive pulmonary disease) ([place])   . Cyst of skin    mid spine area  . Depression 2000   h/o suicidal ideation  . Gastric outlet obstruction   . Gastritis   . Gastroparesis   . Migraine   . Migraines   . Nausea and vomiting    recurrent  . Nicotine addiction   . Osteoporosis   . Peptic ulcer disease   . Pyloric spasm 03/30/2011  . Seasonal allergies   . Sinusitis   . Substance abuse ([place]) 2008   marijuana  . Vitamin D deficiency     Past Surgical History:  Procedure Laterality Date  . BALLOON DILATION N/A 02/09/2013   Procedure: BALLOON DILATION;  Surgeon: Missy Sabins, MD;  Location: [facility];  Service: Endoscopy;  Laterality: N/A;  . BALLOON DILATION N/A 03/10/2018   Procedure: BALLOON DILATION;  Surgeon: Daneil Dolin, MD;  [facility];  Service: Endoscopy;  Laterality: N/A;  . BILIARY DILATION  06/01/2018   Procedure: PYLORIC DILATION;  Surgeon: Rush Landmark Telford Nab., MD;  Location: [place];  Service: Gastroenterology;;  . BIOPSY  06/01/2018   Procedure: BIOPSY;  Surgeon: Irving Copas., MD;  Location: [facility];  Service: Gastroenterology;;  . Lum Keas INJECTION N/A 02/09/2013   Procedure: BOTOX INJECTION;  Surgeon: Missy Sabins, MD;  Location: [facility];  Service: Endoscopy;  Laterality: N/A;  possible balloon  . BOTOX INJECTION N/A 10/24/2015   Procedure: BOTOX INJECTION;  Surgeon: Daneil Dolin, MD;  [facility];  Service: Endoscopy;  Laterality: N/A;  . BOTOX INJECTION N/A 03/10/2018   Procedure: BOTOX INJECTION;  Surgeon: Daneil Dolin, MD;  [facility];  Service: Endoscopy;  Laterality: N/A;  Melanie notified  . BOTOX INJECTION  06/01/2018   Procedure: BOTOX INJECTION;  Surgeon: Rush Landmark Telford Nab., MD;  Location: [place];  Service: Gastroenterology;;  .  CARPAL TUNNEL RELEASE     left hand  . COLONOSCOPY WITH PROPOFOL N/A 09/20/2014   RMR: Normal ileo-colonoscopy  . ESOPHAGEAL DILATION  12/25/2010   Procedure: ESOPHAGEAL DILATION;  Surgeon: Dorothyann Peng, MD;  [facility];  Service: Endoscopy;;  . ESOPHAGEAL MANOMETRY N/A 10/03/2018   Procedure: ESOPHAGEAL MANOMETRY (EM);  Surgeon: Mauri Pole, MD;  Location: [facility];  Service: Endoscopy;  Laterality: N/A;  . ESOPHAGOGASTRODUODENOSCOPY  12/25/2010   Dorothyann Peng, MD;  moderate gastritis, ?goo secondary to pylorspasm. BX showed reactive gstropathy no h.pyori, SB mucosa with intramucosal lymphocytosis and partial villous blunting (TTG 4.0 normal)  . ESOPHAGOGASTRODUODENOSCOPY N/A 11/14/2012   [id number] hiatal hernia. Abnormal gastric mucosa of  uncertain significance-status post biopsy. Subjectively, patient may have recurrent symptomatic, pylorospasm.  . ESOPHAGOGASTRODUODENOSCOPY (EGD) WITH PROPOFOL N/A 02/09/2013   elongated stomach, partial lower esophageal ring widely patent. No obvious pyloric stenosis s/p Botox  . ESOPHAGOGASTRODUODENOSCOPY (EGD) WITH PROPOFOL N/A 10/24/2015   Procedure: ESOPHAGOGASTRODUODENOSCOPY (EGD) WITH PROPOFOL;  Surgeon: Daneil Dolin, MD;  [facility];  Service: Endoscopy;  Laterality: N/A;  830   . ESOPHAGOGASTRODUODENOSCOPY (EGD) WITH PROPOFOL N/A 03/10/2018   Dr. Gala Romney: normal esophagus, elongated, dilated stomach likely stigmata of slow gastric emptying due to narrowing of pyloric channel, s/p balloon dilatation of pyloric channel. Small hiatal hernia, normal duodenal bulb and second portion of duodenum  . ESOPHAGOGASTRODUODENOSCOPY (EGD) WITH PROPOFOL N/A 06/01/2018   Procedure: ESOPHAGOGASTRODUODENOSCOPY (EGD) WITH PROPOFOL;  Surgeon: Rush Landmark Telford Nab., MD;  Location: [place];  Service: Gastroenterology;  Laterality: N/A;  . LAPAROSCOPIC CHOLECYSTECTOMY  2002   [facility] Na?  [place]?  . laser surgery on cervix    .  NASAL SEPTUM SURGERY  01/29/2017  . TOE SURGERY      Current Outpatient Medications  Medication Sig Dispense Refill  . acetaminophen (TYLENOL) 325 MG tablet Take 2 tablets (650 mg total) by mouth every 6 (six) hours as needed for mild pain (or Fever >/= 101). 20 tablet 0  . azelastine (ASTELIN) 0.1 % nasal spray USE 2 SPRAYS IN EACH NOSTRIL TWICE DAILY AS DIRECTED 30 mL 5  . budesonide-formoterol (SYMBICORT) 80-4.5 MCG/ACT inhaler Inhale 2 puffs into the lungs 2 (two) times daily. 1 Inhaler 3  . diazepam (VALIUM) 5 MG tablet Take 1 tablet (5 mg total) by mouth 2 (two) times daily as needed for muscle spasms. 12 tablet 0  . diclofenac (VOLTAREN) 75 MG EC tablet TAKE 1 TABLET(75 MG) BY MOUTH TWICE DAILY 180 tablet 1  . estradiol (ESTRACE) 0.1 MG/GM vaginal cream INSERT [id number] APPLICATORFUL VAGINALLY THREE TIMES WEEKLY AS DIRECTED 42.5 g 4  . estradiol (ESTRACE) 1 MG tablet TAKE 1 TABLET(1 MG) BY MOUTH DAILY 90 tablet 3  . feeding supplement (ENSURE CLINICAL STRENGTH) LIQD Take 237 mLs by mouth 3 (three) times daily with meals. (Patient taking differently: Take 237 mLs by mouth daily. ) 10 Bottle 3  . fluticasone (FLONASE) 50 MCG/ACT nasal spray SHAKE LIQUID AND USE 2 SPRAYS IN EACH NOSTRIL DAILY 48 g 2  . gabapentin (NEURONTIN) 100 MG capsule Take 1 capsule (100 mg total) by mouth 3 (three) times daily. 90 capsule 5  . hydrocortisone 2.5 % ointment     . hydroxychloroquine (PLAQUENIL) 200 MG tablet Take 200 mg by mouth daily.    Marland Kitchen ketorolac (TORADOL) 10 MG tablet Take 1 tablet (10 mg total) by mouth every 8 (eight) hours as needed. 18 tablet 0  . megestrol (MEGACE) 20 MG tablet TAKE 1 TABLET(20 MG) BY MOUTH DAILY 30 tablet 3  . metoCLOPramide (REGLAN) 10 MG tablet Take 1 tablet (10 mg total) by mouth every 8 (eight) hours as needed for nausea. 9 tablet 5  . ondansetron (ZOFRAN) 4 MG tablet Take 1 tablet (4 mg total) by mouth every 6 (six) hours as needed for nausea or vomiting. 30 tablet 2  .  pantoprazole (PROTONIX) 40 MG tablet Take 1 tablet (40 mg total) by mouth 2 (two) times daily before a meal. 180 tablet 3  . potassium chloride SA (K-DUR) 20 MEQ tablet TAKE 1 TABLET BY MOUTH EVERY DAY 90 tablet 3  . pravastatin (PRAVACHOL) 20 MG tablet TAKE 1 TABLET(20 MG) BY MOUTH DAILY 90 tablet 1  . progesterone (PROMETRIUM) 200 MG capsule TAKE 1 CAPSULE BY MOUTH EVERY DAY FOR 2 WEEKS EVERY THIRD MONTH: JANUARY, APRIL, JULY AND OCTOBER 14 capsule 3  . propranolol (INDERAL) 10 MG tablet Take 1 tablet (10 mg total) by mouth 3 (three) times daily. 90 tablet 2  . risperiDONE (RISPERDAL) 0.5 MG tablet Take 0.5 mg at bedtime by mouth.    Marland Kitchen  sertraline (ZOLOFT) 50 MG tablet Take 50 mg by mouth at bedtime.     . sucralfate (CARAFATE) 1 g tablet One tablet every morning and at bedtime for stomach burning. 180 tablet 3  . tiZANidine (ZANAFLEX) 4 MG tablet TAKE 1 TABLET(4 MG) BY MOUTH BACK TWICE DAILY AS NEEDED FOR SPASMS 180 tablet 1  . VENTOLIN HFA 108 (90 Base) MCG/ACT inhaler INHALE 2 PUFFS INTO THE LUNGS EVERY 6 HOURS AS NEEDED FOR SHORTNESS OF BREATH 18 g 0  . zonisamide (ZONEGRAN) 50 MG capsule Take 150 mg by mouth at bedtime.   2   No current facility-administered medications for this visit.    Allergies as of 05/02/2019 - Review Complete 05/02/2019  Allergen Reaction Noted  . Benadryl [diphenhydramine] Anaphylaxis 03/25/2014  . Penicillins Other (See Comments)   . Latex Itching and Other (See Comments) 11/30/2011    Family History  Problem Relation Age of Onset  . Diabetes Father   . Liver disease Father        liver transplant at [facility], age 48  . Lung cancer Mother 83  . Heart disease Maternal Grandmother   . Parkinson's disease Maternal Grandfather   . Multiple sclerosis Sister 69  . Depression Sister 75  . Alcohol abuse Sister   . Bipolar disorder Sister   . Schizophrenia Sister   . Colon cancer Neg Hx     Social History   Socioeconomic History  . Marital status:  Significant Other    Spouse name: Not on file  . Number of children: 0  . Years of education: 9  . Highest education level: 9th grade  Occupational History  . Occupation: disability    Employer: UNEMPLOYED  Tobacco Use  . Smoking status: Current Every Day Smoker    Packs/day: 0.10    Years: 15.00    Pack years: 1.50    Types: Cigarettes  . Smokeless tobacco: Never Used  . Tobacco comment: 2 per day  Substance and Sexual Activity  . Alcohol use: No    Alcohol/week: 0.0 standard drinks  . Drug use: Yes    Frequency: 2.0 times per week    Types: Marijuana    Comment: trying to quit  . Sexual activity: Yes    Birth control/protection: None  Other Topics Concern  . Not on file  Social History Narrative  . Not on file   Social Determinants of Health   Financial Resource Strain:   . Difficulty of Paying Living Expenses:   Food Insecurity:   . Worried About Charity fundraiser in the Last Year:   . Arboriculturist in the Last Year:   Transportation Needs:   . Film/video editor (Medical):   Marland Kitchen Lack of Transportation (Non-Medical):   Physical Activity:   . Days of Exercise per Week:   . Minutes of Exercise per Session:   Stress:   . Feeling of Stress :   Social Connections:   . Frequency of Communication with Friends and Family:   . Frequency of Social Gatherings with Friends and Family:   . Attends Religious Services:   . Active Member of Clubs or Organizations:   . Attends Archivist Meetings:   Marland Kitchen Marital Status:     Subjective: Review of Systems  Constitutional: Negative for chills, fever, malaise/fatigue and weight loss.  HENT: Negative for congestion and sore throat.   Respiratory: Negative for cough and shortness of breath.   Cardiovascular: Negative for chest pain and  palpitations.  Gastrointestinal: Negative for abdominal pain, blood in stool, diarrhea, melena, nausea and vomiting.  Musculoskeletal: Negative for joint pain and myalgias.  Skin:  Positive for rash (known skin "condition" treated by dermatology).  Neurological: Negative for dizziness and weakness.  Endo/Heme/Allergies: Does not bruise/bleed easily.  Psychiatric/Behavioral: Negative for depression. The patient is not nervous/anxious.   All other systems reviewed and are negative.    Objective: BP 96/61   Pulse 74   Temp (!) 97.1 F (36.2 C) (Oral)   Ht 5\' 6"  (1.676 m)   Wt 156 lb 12.8 oz (71.1 kg)   LMP  (LMP Unknown) Comment: pt reports premature menopause  BMI 25.31 kg/m  Physical Exam Vitals and nursing note reviewed.  Constitutional:      General: She is not in acute distress.    Appearance: Normal appearance. She is well-developed and normal weight. She is not ill-appearing, toxic-appearing or diaphoretic.  HENT:     Head: Normocephalic and atraumatic.     Nose: No congestion or rhinorrhea.  Eyes:     General: No scleral icterus. Cardiovascular:     Rate and Rhythm: Normal rate and regular rhythm.     Heart sounds: Normal heart sounds.  Pulmonary:     Effort: Pulmonary effort is normal. No respiratory distress.     Breath sounds: Normal breath sounds.  Abdominal:     General: Bowel sounds are normal.     Palpations: Abdomen is soft. There is no hepatomegaly, splenomegaly or mass.     Tenderness: There is no abdominal tenderness. There is no guarding or rebound.     Hernia: No hernia is present.  Skin:    General: Skin is warm and dry.     Coloration: Skin is not jaundiced.     Findings: Rash (bilateral UE, slightly raised large plaque areas; treated by Dermatology) present.  Neurological:     General: No focal deficit present.     Mental Status: She is alert and oriented to person, place, and time.  Psychiatric:        Attention and Perception: Attention normal.        Mood and Affect: Mood normal.        Speech: Speech normal.        Behavior: Behavior normal.        Thought Content: Thought content normal.        Cognition and Memory:  Cognition and memory normal.       05/02/2019 10:17 AM   Disclaimer: This note was dictated with voice recognition software. Similar sounding words can inadvertently be transcribed and may not be corrected upon review.

## 2019-05-02 NOTE — Assessment & Plan Note (Signed)
Constipation doing well on reduced dose of Linzess 145 mcg daily.  She was previously on 290 mcg daily but she was only able to take this about once every 1 to 2 weeks due to diarrhea symptoms.  She is much more satisfied with her reduced dose.  However, her pharmacy did not receive the prescription previously sent.  She has been using her 290 mcg dose and simply taking half of it.  Refill was sent to her pharmacy for Linzess 145 mcg daily.  Call for any worsening or severe symptoms, otherwise follow-up in 6 months.

## 2019-05-02 NOTE — Assessment & Plan Note (Signed)
History of gastric outlet obstruction with previous EGDs with Botox injection.  Her last exam was completed about a year ago.  She has not had any recurrent symptoms to suggest worsening GOO.  Tolerating gastroparesis diet without nausea and vomiting or abdominal pain if she takes her medications.  Recommend she continue her current medicines.  Follow-up in 6 months and call us for any worsening or recurrent flare of symptoms.

## 2019-05-02 NOTE — Patient Instructions (Addendum)
PT referral Restart volatren and zanaflex Ice/heat Neck Exercises Ask your health care provider which exercises are safe for you. Do exercises exactly as told by your health care provider and adjust them as directed. It is normal to feel mild stretching, pulling, tightness, or discomfort as you do these exercises. Stop right away if you feel sudden pain or your pain gets worse. Do not begin these exercises until told by your health care provider. Neck exercises can be important for many reasons. They can improve strength and maintain flexibility in your neck, which will help your upper back and prevent neck pain. Stretching exercises Rotation neck stretching  1. Sit in a chair or stand up. 2. Place your feet flat on the floor, shoulder width apart. 3. Slowly turn your head (rotate) to the right until a slight stretch is felt. Turn it all the way to the right so you can look over your right shoulder. Do not tilt or tip your head. 4. Hold this position for 10-30 seconds. 5. Slowly turn your head (rotate) to the left until a slight stretch is felt. Turn it all the way to the left so you can look over your left shoulder. Do not tilt or tip your head. 6. Hold this position for 10-30 seconds. Repeat __________ times. Complete this exercise __________ times a day. Neck retraction 1. Sit in a sturdy chair or stand up. 2. Look straight ahead. Do not bend your neck. 3. Use your fingers to push your chin backward (retraction). Do not bend your neck for this movement. Continue to face straight ahead. If you are doing the exercise properly, you will feel a slight sensation in your throat and a stretch at the back of your neck. 4. Hold the stretch for 1-2 seconds. Repeat __________ times. Complete this exercise __________ times a day. Strengthening exercises Neck press 1. Lie on your back on a firm bed or on the floor with a pillow under your head. 2. Use your neck muscles to push your head down on the  pillow and straighten your spine. 3. Hold the position as well as you can. Keep your head facing up (in a neutral position) and your chin tucked. 4. Slowly count to 5 while holding this position. Repeat __________ times. Complete this exercise __________ times a day. Isometrics These are exercises in which you strengthen the muscles in your neck while keeping your neck still (isometrics). 1. Sit in a supportive chair and place your hand on your forehead. 2. Keep your head and face facing straight ahead. Do not flex or extend your neck while doing isometrics. 3. Push forward with your head and neck while pushing back with your hand. Hold for 10 seconds. 4. Do the sequence again, this time putting your hand against the back of your head. Use your head and neck to push backward against the hand pressure. 5. Finally, do the same exercise on either side of your head, pushing sideways against the pressure of your hand. Repeat __________ times. Complete this exercise __________ times a day. Prone head lifts 1. Lie face-down (prone position), resting on your elbows so that your chest and upper back are raised. 2. Start with your head facing downward, near your chest. Position your chin either on or near your chest. 3. Slowly lift your head upward. Lift until you are looking straight ahead. Then continue lifting your head as far back as you can comfortably stretch. 4. Hold your head up for 5 seconds. Then slowly lower  it to your starting position. Repeat __________ times. Complete this exercise __________ times a day. Supine head lifts 1. Lie on your back (supine position), bending your knees to point to the ceiling and keeping your feet flat on the floor. 2. Lift your head slowly off the floor, raising your chin toward your chest. 3. Hold for 5 seconds. Repeat __________ times. Complete this exercise __________ times a day. Scapular retraction 1. Stand with your arms at your sides. Look straight  ahead. 2. Slowly pull both shoulders (scapulae) backward and downward (retraction) until you feel a stretch between your shoulder blades in your upper back. 3. Hold for 10-30 seconds. 4. Relax and repeat. Repeat __________ times. Complete this exercise __________ times a day. Contact a health care provider if:  Your neck pain or discomfort gets much worse when you do an exercise.  Your neck pain or discomfort does not improve within 2 hours after you exercise. If you have any of these problems, stop exercising right away. Do not do the exercises again unless your health care provider says that you can. Get help right away if:  You develop sudden, severe neck pain. If this happens, stop exercising right away. Do not do the exercises again unless your health care provider says that you can. This information is not intended to replace advice given to you by your health care provider. Make sure you discuss any questions you have with your health care provider. Document Revised: 11/03/2017 Document Reviewed: 11/03/2017 Elsevier Patient Education  [employer].    If you have lab work done today you will be contacted with your lab results within the next 2 weeks.  If you have not heard from [place] then please contact us. The fastest way to get your results is to register for My Chart.   IF you received an x-ray today, you will receive an invoice from [facility]. Please contact [facility] Radiology at [phone] with questions or concerns regarding your invoice.   IF you received labwork today, you will receive an invoice from [place] [facility]. Please contact [facility] at [phone] with questions or concerns regarding your invoice.   Our billing staff will not be able to assist you with questions regarding bills from these companies.  You will be contacted with the lab results as soon as they are available. The fastest way to get your results is to activate your My Chart account.  Instructions are located on the last page of this paperwork. If you have not heard from [place] regarding the results in 2 weeks, please contact this office.

## 2019-05-02 NOTE — Progress Notes (Signed)
Established Patient Office Visit  Subjective:  Patient ID: Tracey Daniel, female    DOB: [birth year]  Age: 39 y.o. MRN: [id number]  CC:  Chief Complaint  Patient presents with  . Neck Pain    neck and upper back/shoulder pain started 04/16/19 then went to the ER 04/26/19 cause the pain caused me to have chest pain. The pain is no better and seems like it is worse then when she went to the ER. The med they gave me help for a little while but when is wear off the pain comes back  ER noted 04-26-19    39 year old female presenting to the ED with right-sided neck and chest pain.  Neck pain started on 04/16/2019, thinks she "slept wrong".  Pain has since radiated down into her chest and right shoulder.  Pain is better with sitting still, worse with movement.  She is on diclofenac and Zanaflex at home but has not had any relief with this.  She is afebrile and nontoxic.  She does have muscular tenderness of the right cervical paraspinal musculature extending into the trapezius.  There is noted spasm.  Pain with abduction of right shoulder and chest wall is locally tender.  No signs of trauma.  Lungs are clear bilaterally.  EKG is nonischemic.  Labs reassuring including troponin x2.  Chest x-ray is clear.  This seems to be musculoskeletal.  Will provide symptomatic treatment here and reassess. HPI [name] presents for upper back pain-h/o injections for occipital headaches-chronic migraines. Pt seen at ER for back and shoulder pain radiating into the chest-given Toradol and Valium po. Pt states some improvement. Pt with neck pain in the past-PT recommended. Pt did not go due to COVID.  NO visual changes, no difficulty swallowing. Tenderness on the neck and shoulders.FROM  Past Medical History:  Diagnosis Date  . Anemia of other chronic disease 11/29/2012  . Asthma   . Back pain, chronic   . Chronic abdominal pain   . Constipation   . COPD (chronic obstructive pulmonary disease) ([place])   .  Cyst of skin    mid spine area  . Depression 2000   h/o suicidal ideation  . Gastric outlet obstruction   . Gastritis   . Gastroparesis   . Migraine   . Migraines   . Nausea and vomiting    recurrent  . Nicotine addiction   . Osteoporosis   . Peptic ulcer disease   . Pyloric spasm 03/30/2011  . Seasonal allergies   . Sinusitis   . Substance abuse ([place]) 2008   marijuana  . Vitamin D deficiency     Past Surgical History:  Procedure Laterality Date  . BALLOON DILATION N/A 02/09/2013   Procedure: BALLOON DILATION;  Surgeon: Missy Sabins, MD;  Location: [facility];  Service: Endoscopy;  Laterality: N/A;  . BALLOON DILATION N/A 03/10/2018   Procedure: BALLOON DILATION;  Surgeon: Daneil Dolin, MD;  Location[facility];  Service: Endoscopy;  Laterality: N/A;  . BILIARY DILATION  06/01/2018   Procedure: PYLORIC DILATION;  Surgeon: Rush Landmark Telford Nab., MD;  Location: [place];  Service: Gastroenterology;;  . BIOPSY  06/01/2018   Procedure: BIOPSY;  Surgeon: Irving Copas., MD;  Location: [facility] ENDOSCOPY;  Service: Gastroenterology;;  . Lum Keas INJECTION N/A 02/09/2013   Procedure: BOTOX INJECTION;  Surgeon: Missy Sabins, MD;  Location: [facility];  Service: Endoscopy;  Laterality: N/A;  possible balloon  . BOTOX INJECTION N/A 10/24/2015   Procedure:  BOTOX INJECTION;  Surgeon: Daneil Dolin, MD;  [facility];  Service: Endoscopy;  Laterality: N/A;  . BOTOX INJECTION N/A 03/10/2018   Procedure: BOTOX INJECTION;  Surgeon: Daneil Dolin, MD;  [facility];  Service: Endoscopy;  Laterality: N/A;  Melanie notified  . BOTOX INJECTION  06/01/2018   Procedure: BOTOX INJECTION;  Surgeon: Rush Landmark Telford Nab., MD;  Location: [place];  Service: Gastroenterology;;  . CARPAL TUNNEL RELEASE     left hand  . COLONOSCOPY WITH PROPOFOL N/A 09/20/2014   RMR: Normal ileo-colonoscopy  . ESOPHAGEAL DILATION  12/25/2010   Procedure: ESOPHAGEAL DILATION;   Surgeon: Dorothyann Peng, MD;  [facility];  Service: Endoscopy;;  . ESOPHAGEAL MANOMETRY N/A 10/03/2018   Procedure: ESOPHAGEAL MANOMETRY (EM);  Surgeon: Mauri Pole, MD;  Location: [facility];  Service: Endoscopy;  Laterality: N/A;  . ESOPHAGOGASTRODUODENOSCOPY  12/25/2010   Dorothyann Peng, MD;  moderate gastritis, ?goo secondary to pylorspasm. BX showed reactive gstropathy no h.pyori, SB mucosa with intramucosal lymphocytosis and partial villous blunting (TTG 4.0 normal)  . ESOPHAGOGASTRODUODENOSCOPY N/A 11/14/2012   [id number] hiatal hernia. Abnormal gastric mucosa of  uncertain significance-status post biopsy. Subjectively, patient may have recurrent symptomatic, pylorospasm.  . ESOPHAGOGASTRODUODENOSCOPY (EGD) WITH PROPOFOL N/A 02/09/2013   elongated stomach, partial lower esophageal ring widely patent. No obvious pyloric stenosis s/p Botox  . ESOPHAGOGASTRODUODENOSCOPY (EGD) WITH PROPOFOL N/A 10/24/2015   Procedure: ESOPHAGOGASTRODUODENOSCOPY (EGD) WITH PROPOFOL;  Surgeon: Daneil Dolin, MD;  [facility];  Service: Endoscopy;  Laterality: N/A;  830   . ESOPHAGOGASTRODUODENOSCOPY (EGD) WITH PROPOFOL N/A 03/10/2018   Dr. Gala Romney: normal esophagus, elongated, dilated stomach likely stigmata of slow gastric emptying due to narrowing of pyloric channel, s/p balloon dilatation of pyloric channel. Small hiatal hernia, normal duodenal bulb and second portion of duodenum  . ESOPHAGOGASTRODUODENOSCOPY (EGD) WITH PROPOFOL N/A 06/01/2018   Procedure: ESOPHAGOGASTRODUODENOSCOPY (EGD) WITH PROPOFOL;  Surgeon: Rush Landmark Telford Nab., MD;  Location: [place];  Service: Gastroenterology;  Laterality: N/A;  . LAPAROSCOPIC CHOLECYSTECTOMY  2002   [place]?  [place]?  . laser surgery on cervix    . NASAL SEPTUM SURGERY  01/29/2017  . TOE SURGERY      Family History  Problem Relation Age of Onset  . Diabetes Father   . Liver disease Father        liver transplant at [facility],  age 30  . Lung cancer Mother 74  . Heart disease Maternal Grandmother   . Parkinson's disease Maternal Grandfather   . Multiple sclerosis Sister 19  . Depression Sister 27  . Alcohol abuse Sister   . Bipolar disorder Sister   . Schizophrenia Sister   . Colon cancer Neg Hx     Social History   Socioeconomic History  . Marital status: Significant Other    Spouse name: Not on file  . Number of children: 0  . Years of education: 9  . Highest education level: 9th grade  Occupational History  . Occupation: disability    Employer: UNEMPLOYED  Tobacco Use  . Smoking status: Current Every Day Smoker    Packs/day: 0.10    Years: 15.00    Pack years: 1.50    Types: Cigarettes  . Smokeless tobacco: Never Used  . Tobacco comment: 2 per day  Substance and Sexual Activity  . Alcohol use: No    Alcohol/week: 0.0 standard drinks  . Drug use: Yes    Frequency: 2.0 times per  week    Types: Marijuana    Comment: trying to quit  . Sexual activity: Yes    Birth control/protection: None  Other Topics Concern  . Not on file  Social History Narrative  . Not on file   Social Determinants of Health   Financial Resource Strain:   . Difficulty of Paying Living Expenses:   Food Insecurity:   . Worried About Charity fundraiser in the Last Year:   . Arboriculturist in the Last Year:   Transportation Needs:   . Film/video editor (Medical):   Marland Kitchen Lack of Transportation (Non-Medical):   Physical Activity:   . Days of Exercise per Week:   . Minutes of Exercise per Session:   Stress:   . Feeling of Stress :   Social Connections:   . Frequency of Communication with Friends and Family:   . Frequency of Social Gatherings with Friends and Family:   . Attends Religious Services:   . Active Member of Clubs or Organizations:   . Attends Archivist Meetings:   Marland Kitchen Marital Status:   Intimate Partner Violence:   . Fear of Current or Ex-Partner:   . Emotionally Abused:   Marland Kitchen  Physically Abused:   . Sexually Abused:     Outpatient Medications Prior to Visit  Medication Sig Dispense Refill  . acetaminophen (TYLENOL) 325 MG tablet Take 2 tablets (650 mg total) by mouth every 6 (six) hours as needed for mild pain (or Fever >/= 101). 20 tablet 0  . azelastine (ASTELIN) 0.1 % nasal spray USE 2 SPRAYS IN EACH NOSTRIL TWICE DAILY AS DIRECTED 30 mL 5  . budesonide-formoterol (SYMBICORT) 80-4.5 MCG/ACT inhaler Inhale 2 puffs into the lungs 2 (two) times daily. 1 Inhaler 3  . diazepam (VALIUM) 5 MG tablet Take 1 tablet (5 mg total) by mouth 2 (two) times daily as needed for muscle spasms. 12 tablet 0  . diclofenac (VOLTAREN) 75 MG EC tablet TAKE 1 TABLET(75 MG) BY MOUTH TWICE DAILY 180 tablet 1  . estradiol (ESTRACE) 0.1 MG/GM vaginal cream INSERT [id number] APPLICATORFUL VAGINALLY THREE TIMES WEEKLY AS DIRECTED 42.5 g 4  . estradiol (ESTRACE) 1 MG tablet TAKE 1 TABLET(1 MG) BY MOUTH DAILY 90 tablet 3  . feeding supplement (ENSURE CLINICAL STRENGTH) LIQD Take 237 mLs by mouth 3 (three) times daily with meals. (Patient taking differently: Take 237 mLs by mouth daily. ) 10 Bottle 3  . fluticasone (FLONASE) 50 MCG/ACT nasal spray SHAKE LIQUID AND USE 2 SPRAYS IN EACH NOSTRIL DAILY 48 g 2  . gabapentin (NEURONTIN) 100 MG capsule Take 1 capsule (100 mg total) by mouth 3 (three) times daily. 90 capsule 5  . hydrocortisone 2.5 % ointment     . hydroxychloroquine (PLAQUENIL) 200 MG tablet Take 200 mg by mouth daily.    Marland Kitchen ketorolac (TORADOL) 10 MG tablet Take 1 tablet (10 mg total) by mouth every 8 (eight) hours as needed. 18 tablet 0  . linaclotide (LINZESS) 145 MCG CAPS capsule Take 1 capsule (145 mcg total) by mouth daily before breakfast. 30 capsule 5  . megestrol (MEGACE) 20 MG tablet TAKE 1 TABLET(20 MG) BY MOUTH DAILY 30 tablet 3  . metoCLOPramide (REGLAN) 10 MG tablet Take 1 tablet (10 mg total) by mouth every 8 (eight) hours as needed for nausea. 9 tablet 5  . ondansetron (ZOFRAN) 4  MG tablet Take 1 tablet (4 mg total) by mouth every 6 (six) hours as needed for nausea  or vomiting. 30 tablet 2  . pantoprazole (PROTONIX) 40 MG tablet Take 1 tablet (40 mg total) by mouth 2 (two) times daily before a meal. 180 tablet 3  . potassium chloride SA (K-DUR) 20 MEQ tablet TAKE 1 TABLET BY MOUTH EVERY DAY 90 tablet 3  . pravastatin (PRAVACHOL) 20 MG tablet TAKE 1 TABLET(20 MG) BY MOUTH DAILY 90 tablet 1  . progesterone (PROMETRIUM) 200 MG capsule TAKE 1 CAPSULE BY MOUTH EVERY DAY FOR 2 WEEKS EVERY THIRD MONTH: JANUARY, APRIL, JULY AND OCTOBER 14 capsule 3  . propranolol (INDERAL) 10 MG tablet Take 1 tablet (10 mg total) by mouth 3 (three) times daily. 90 tablet 2  . risperiDONE (RISPERDAL) 0.5 MG tablet Take 0.5 mg at bedtime by mouth.    . sertraline (ZOLOFT) 50 MG tablet Take 50 mg by mouth at bedtime.     . sucralfate (CARAFATE) 1 g tablet One tablet every morning and at bedtime for stomach burning. 180 tablet 3  . tiZANidine (ZANAFLEX) 4 MG tablet TAKE 1 TABLET(4 MG) BY MOUTH BACK TWICE DAILY AS NEEDED FOR SPASMS 180 tablet 1  . VENTOLIN HFA 108 (90 Base) MCG/ACT inhaler INHALE 2 PUFFS INTO THE LUNGS EVERY 6 HOURS AS NEEDED FOR SHORTNESS OF BREATH 18 g 0  . zonisamide (ZONEGRAN) 50 MG capsule Take 150 mg by mouth at bedtime.   2   No facility-administered medications prior to visit.    Allergies  Allergen Reactions  . Benadryl [Diphenhydramine] Anaphylaxis  . Penicillins Other (See Comments)    Unknown Has patient had a PCN reaction causing immediate rash, facial/tongue/throat swelling, SOB or lightheadedness with hypotension Yes Has patient had a PCN reaction causing severe rash involving mucus membranes or skin necrosis: Yes-hives  Has patient had a PCN reaction that required hospitalization Yes Has patient had a PCN reaction occurring within the last 10 years: Yes If all of the above answers are "NO", then may proceed with Cephalosporin use.   . Latex Itching and Other  (See Comments)    "burning" where the tape goes    ROS Review of Systems  Constitutional: Negative.   Musculoskeletal: Positive for back pain, neck pain and neck stiffness.  Neurological: Positive for headaches.      Objective:    Physical Exam  Constitutional: She is oriented to person, place, and time. She appears well-developed and well-nourished.  HENT:  Head: Normocephalic and atraumatic.  Cardiovascular: Normal rate, regular rhythm, normal heart sounds and intact distal pulses.  Pulmonary/Chest: Effort normal and breath sounds normal.  Musculoskeletal:        General: Tenderness present.     Cervical back: Tenderness present.       Back:     Comments: Point tenderness occipital skull bilat and cervical tenderness to palpation, trapezium point tenderness to palpation  Neurological: She is alert and oriented to person, place, and time.    BP 106/65 (BP Location: Left Arm, Patient Position: Sitting, Cuff Size: Normal)   Pulse 73   Temp 97.9 F (36.6 C) (Temporal)   Ht 5\' 6"  (1.676 m)   Wt 157 lb (71.2 kg)   LMP  (LMP Unknown) Comment: pt reports premature menopause  SpO2 97%   BMI 25.34 kg/m  Wt Readings from Last 3 Encounters:  05/02/19 157 lb (71.2 kg)  05/02/19 156 lb 12.8 oz (71.1 kg)  04/26/19 150 lb (68 kg)    Lab Results  Component Value Date   TSH 1.55 11/11/2018   Lab Results  Component Value Date   WBC 11.1 (H) 04/26/2019   HGB 10.7 (L) 04/26/2019   HCT 32.9 (L) 04/26/2019   MCV 95.4 04/26/2019   PLT 220 04/26/2019   Lab Results  Component Value Date   NA 136 04/26/2019   K 4.1 04/26/2019   CO2 22 04/26/2019   GLUCOSE 101 (H) 04/26/2019   BUN 17 04/26/2019   CREATININE 0.89 04/26/2019   BILITOT 0.3 11/11/2018   ALKPHOS 65 06/22/2018   AST 31 (H) 11/11/2018   ALT 29 11/11/2018   PROT 7.1 11/11/2018   ALBUMIN 4.6 06/22/2018   CALCIUM 9.4 04/26/2019   ANIONGAP 7 04/26/2019   Lab Results  Component Value Date   CHOL 151 11/11/2018    Lab Results  Component Value Date   HDL 42 (L) 11/11/2018   Lab Results  Component Value Date   LDLCALC 90 11/11/2018   Lab Results  Component Value Date   TRIG 93 11/11/2018   Lab Results  Component Value Date   CHOLHDL 3.6 11/11/2018   Lab Results  Component Value Date   HGBA1C 5.0 06/02/2018      Assessment & Plan:  1. Neck pain, chronic Restart Voltaren/zanaflex - Ambulatory referral to Physical Therapy Follow-up: as needed-keep therapy appointment   LISA Hannah Beat, MD

## 2019-05-02 NOTE — Assessment & Plan Note (Signed)
Gastroparesis is actually quite well controlled.  She has been using more Zofran lately, which insurance is now balking at pain for, given that her Reglan prescription kept going to her primary care and has not been refilled.  I have refilled her Reglan prescription.  She states she only needs Zofran if she is not taking Reglan.  Denies any adverse effects from Reglan.  If she is on her medications, minimal to no abdominal pain, no nausea and vomiting.  Eating a gastroparesis diet and able to eat more and keep down medications.  Recommend she continue her current regimen, follow-up in 6 months.  Call us for any worsening symptoms.

## 2019-05-02 NOTE — Assessment & Plan Note (Signed)
GERD symptoms currently well managed on Protonix twice daily.  No breakthrough symptoms.  Recommend she continue her current medications and follow-up in 6 months

## 2019-05-02 NOTE — Progress Notes (Signed)
Cc'ed to pcp °

## 2019-05-09 ENCOUNTER — Ambulatory Visit (HOSPITAL_COMMUNITY): Payer: Medicare HMO | Attending: Family Medicine | Admitting: Physical Therapy

## 2019-05-09 ENCOUNTER — Other Ambulatory Visit: Payer: Self-pay

## 2019-05-09 ENCOUNTER — Encounter (HOSPITAL_COMMUNITY): Payer: Self-pay | Admitting: Physical Therapy

## 2019-05-09 DIAGNOSIS — M549 Dorsalgia, unspecified: Secondary | ICD-10-CM | POA: Insufficient documentation

## 2019-05-09 DIAGNOSIS — M542 Cervicalgia: Secondary | ICD-10-CM | POA: Diagnosis not present

## 2019-05-09 NOTE — Therapy (Signed)
[facility] [address] [place], [place], [address] Phone: [phone]   Fax:  [phone]  Physical Therapy Evaluation  Patient Details  Name: Tracey Daniel [place] MRN: [id number] Date of Birth: 05-Mar-1980 Referring Provider (PT): Benny Lennert   Encounter Date: 05/09/2019  PT End of Session - 05/09/19 0829    Visit Number  1    Number of Visits  12    Date for PT Re-Evaluation  06/20/19   eval 4/20   Authorization Type  [place] with medicaid secondary. no VL, [employer] not covered. Copay $40.    Progress Note Due on Visit  10    PT Start Time  0830    PT Stop Time  0910    PT Time Calculation (min)  40 min    Activity Tolerance  Patient limited by pain    Behavior During Therapy  [facility] for tasks assessed/performed       Past Medical History:  Diagnosis Date  . Anemia of other chronic disease 11/29/2012  . Asthma   . Back pain, chronic   . Chronic abdominal pain   . Constipation   . COPD (chronic obstructive pulmonary disease) ([place])   . Cyst of skin    mid spine area  . Depression 2000   h/o suicidal ideation  . Gastric outlet obstruction   . Gastritis   . Gastroparesis   . Migraine   . Migraines   . Nausea and vomiting    recurrent  . Nicotine addiction   . Osteoporosis   . Peptic ulcer disease   . Pyloric spasm 03/30/2011  . Seasonal allergies   . Sinusitis   . Substance abuse ([place]) 2008   marijuana  . Vitamin D deficiency     Past Surgical History:  Procedure Laterality Date  . BALLOON DILATION N/A 02/09/2013   Procedure: BALLOON DILATION;  Surgeon: Missy Sabins, MD;  Location: [facility];  Service: Endoscopy;  Laterality: N/A;  . BALLOON DILATION N/A 03/10/2018   Procedure: BALLOON DILATION;  Surgeon: Daneil Dolin, MD;  [facility];  Service: Endoscopy;  Laterality: N/A;  . BILIARY DILATION  06/01/2018   Procedure: PYLORIC DILATION;  Surgeon: Rush Landmark Telford Nab., MD;  Location:  [place];  Service: Gastroenterology;;  . BIOPSY  06/01/2018   Procedure: BIOPSY;  Surgeon: Irving Copas., MD;  Location: [facility] ENDOSCOPY;  Service: Gastroenterology;;  . Lum Keas INJECTION N/A 02/09/2013   Procedure: BOTOX INJECTION;  Surgeon: Missy Sabins, MD;  Location: [facility];  Service: Endoscopy;  Laterality: N/A;  possible balloon  . BOTOX INJECTION N/A 10/24/2015   Procedure: BOTOX INJECTION;  Surgeon: Daneil Dolin, MD;  [facility];  Service: Endoscopy;  Laterality: N/A;  . BOTOX INJECTION N/A 03/10/2018   Procedure: BOTOX INJECTION;  Surgeon: Daneil Dolin, MD;  [facility];  Service: Endoscopy;  Laterality: N/A;  [place] notified  . BOTOX INJECTION  06/01/2018   Procedure: BOTOX INJECTION;  Surgeon: Rush Landmark Telford Nab., MD;  Location: [place];  Service: Gastroenterology;;  . CARPAL TUNNEL RELEASE     left hand  . COLONOSCOPY WITH PROPOFOL N/A 09/20/2014   RMR: Normal ileo-colonoscopy  . ESOPHAGEAL DILATION  12/25/2010   Procedure: ESOPHAGEAL DILATION;  Surgeon: Dorothyann Peng, MD;  [facility];  Service: Endoscopy;;  . ESOPHAGEAL MANOMETRY N/A 10/03/2018   Procedure: ESOPHAGEAL MANOMETRY (EM);  Surgeon: Mauri Pole, MD;  Location: [facility];  Service: Endoscopy;  Laterality: N/A;  . ESOPHAGOGASTRODUODENOSCOPY  12/25/2010   Dorothyann Peng, MD;  moderate gastritis, ?goo secondary to pylorspasm. BX showed reactive gstropathy no h.pyori, SB mucosa with intramucosal lymphocytosis and partial villous blunting (TTG 4.0 normal)  . ESOPHAGOGASTRODUODENOSCOPY N/A 11/14/2012   [id number] hiatal hernia. Abnormal gastric mucosa of  uncertain significance-status post biopsy. Subjectively, patient may have recurrent symptomatic, pylorospasm.  . ESOPHAGOGASTRODUODENOSCOPY (EGD) WITH PROPOFOL N/A 02/09/2013   elongated stomach, partial lower esophageal ring widely patent. No obvious pyloric stenosis s/p Botox  .  ESOPHAGOGASTRODUODENOSCOPY (EGD) WITH PROPOFOL N/A 10/24/2015   Procedure: ESOPHAGOGASTRODUODENOSCOPY (EGD) WITH PROPOFOL;  Surgeon: Daneil Dolin, MD;  [facility];  Service: Endoscopy;  Laterality: N/A;  830   . ESOPHAGOGASTRODUODENOSCOPY (EGD) WITH PROPOFOL N/A 03/10/2018   Dr. Gala Romney: normal esophagus, elongated, dilated stomach likely stigmata of slow gastric emptying due to narrowing of pyloric channel, s/p balloon dilatation of pyloric channel. Small hiatal hernia, normal duodenal bulb and second portion of duodenum  . ESOPHAGOGASTRODUODENOSCOPY (EGD) WITH PROPOFOL N/A 06/01/2018   Procedure: ESOPHAGOGASTRODUODENOSCOPY (EGD) WITH PROPOFOL;  Surgeon: Rush Landmark Telford Nab., MD;  Location: [place];  Service: Gastroenterology;  Laterality: N/A;  . LAPAROSCOPIC CHOLECYSTECTOMY  2002   [place]?  [place]?  . laser surgery on cervix    . NASAL SEPTUM SURGERY  01/29/2017  . TOE SURGERY      There were no vitals filed for this visit.   Subjective Assessment - 05/09/19 1041    Subjective  States she started having neck pain down from her neck to her back on both sides that started on 04/16/19 . States she has also had constant pain in her mid back where she can't wear a bra (this was occurring prior to her recent exacerbation of neck pain). States since she went to the hospital her pain is here and there and it still catches her in her neck and back. States she tries to move her neck and arms to maintain movement, but it still catches her. Current pain is 8/10 on the right side and mid back. States she had neck injections for headaches on 04/13/19 -has had them prior, has had headaches for 15 years. Follows up with headache specialist on 05/17/19. States she had therapy for her hand a long time ago. Reports she tried heat but it doesn't stay in place. Support along the back of the neck helps, states it is worse in the morning. Reports she lays on her side and back to sleep - mostly on  back.    Pertinent History  COPD, depression, chronic back and neck pain, migraines    Limitations  Standing;Lifting;Reading;House hold activities    Patient Stated Goals  to have less pain    Currently in Pain?  Yes    Pain Score  8     Pain Location  Back    Pain Orientation  Right;Left;Upper;Mid    Pain Descriptors / Indicators  Aching;Tightness;Throbbing;Tender    Pain Type  Chronic pain    Pain Radiating Towards  up neck    Pain Onset  More than a month ago    Pain Frequency  Constant    Aggravating Factors   movement    Pain Relieving Factors  support at neck         [facility] PT Assessment - 05/09/19 0001      Assessment   Medical Diagnosis  neck pain     Referring Provider (PT)  Benny Lennert  Prior Therapy  yes for hand years ago      Precautions   Precautions  None      Balance Screen   Has the patient fallen in the past 6 months  No      [place] residence    Available Help at Discharge  Family    Type of [place]      Prior Function   Level of Independence  Independent      Cognition   Overall Cognitive Status  Within Functional Limits for tasks assessed      Observation/Other Assessments   Observations  slumped seated posture with shoulders rounded and drawn forward    Focus on Therapeutic Outcomes (FOTO)   55% limited      ROM / Strength   AROM / PROM / Strength  AROM      AROM   AROM Assessment Site  Cervical;Shoulder    Right/Left Shoulder  Right;Left    Cervical Flexion  55   no change in symptoms   Cervical Extension  50   tension in the back   Cervical - Right Side Bend  WNL   pain on right side   Cervical - Left Side Bend  WNL   pain on right side   Cervical - Right Rotation  42   pain on right side   Cervical - Left Rotation  40   pain on right side     Palpation   Spinal mobility  unable to assess secondary to tenderness to palpation    Palpation comment   very tender to light touch on both  sides of cervical spine and surrounding musculature, with symptoms radiating around both shoulders.                 Objective measurements completed on examination: See above findings.      [facility] Adult PT Treatment/Exercise - 05/09/19 0001      Exercises   Exercises  Neck      Neck Exercises: Seated   Other Seated Exercise  diaphragmatic breathing in sitting - practiced in session with verbal cues and prior demonstration. - 6 minutes             PT Education - 05/09/19 1042    Education Details  on current condition and findings. POC. on breathing exercises and how it can help her    Person(s) Educated  Patient    Methods  Explanation;Handout    Comprehension  Verbalized understanding       PT Short Term Goals - 05/09/19 1117      PT SHORT TERM GOAL #1   Title  Patient will be independent in self management strategies to improve quality of life and functional outcomes.    Time  3    Period  Weeks    Status  New    Target Date  05/30/19      PT SHORT TERM GOAL #2   Title  Patient will report at least 25% improvement in overall symptoms and/or functional ability.    Time  3    Period  Weeks    Status  New    Target Date  05/30/19      PT SHORT TERM GOAL #3   Title  Patient will be able to tolerate light touch to demonstrate decreased hypersensitivity to light tough.    Time  3    Period  Weeks  Status  New    Target Date  05/30/19        PT Long Term Goals - 05/09/19 1119      PT LONG TERM GOAL #1   Title  Patient will score with < 41% limitation on FOTO to demonstrate improved overall function.    Time  6    Period  Weeks    Status  New    Target Date  06/20/19      PT LONG TERM GOAL #2   Title  Patient will be able to demonstrate painfree cervical mobility to demonstrate improved gross cervical ROM.    Time  6    Period  Weeks    Status  New    Target Date  06/20/19      PT LONG TERM GOAL #3   Title  Patient will report at least  50% improvement in overall symptoms and/or functional ability.    Time  6    Period  Weeks    Status  New    Target Date  06/20/19             Plan - 05/09/19 1152    Clinical Impression Statement  Patient presents with chronic neck and back pain with recent exacerbation of neck symptoms about a month ago. Patient did not tolerated session well secondary to low threshold for tactile interventions. Educated patient on deep breathing exercises and these seemed to be tolerated the best. Patient would benefit from skilled physical therapy to help with down regulation of sympathetic nervous system and improve functional mobility and quality of life.    Personal Factors and Comorbidities  Comorbidity 1;Comorbidity 2    Comorbidities  depression, chronic migraines, COPD    Examination-Activity Limitations  Sleep;Bed Mobility;Bathing;Sit;Carry;Stand;Bend;Lift;Reach Overhead    Examination-Participation Restrictions  Meal Prep;Community Activity;Cleaning    Stability/Clinical Decision Making  Stable/Uncomplicated    Clinical Decision Making  Low    Rehab Potential  Fair    PT Frequency  2x / week    PT Duration  6 weeks    PT Treatment/Interventions  ADLs/Self Care Home Management;Cryotherapy;Electrical Stimulation;Therapeutic exercise;Balance training;Traction;Moist Heat;Therapeutic activities;Functional mobility training;Gait training;DME Instruction;Neuromuscular re-education;Patient/family education;Manual techniques;Dry needling;Passive range of motion;Joint Manipulations;Taping    PT Next Visit Plan  pain management strategies - heat/percussion gun, deep breathing exercises, cross midline movements, gentle STM once tolerated, ROM    PT Home Exercise Plan  4/20 deep breathing exercises       Patient will benefit from skilled therapeutic intervention in order to improve the following deficits and impairments:  Pain, Decreased activity tolerance, Decreased mobility, Decreased range of  motion, Postural dysfunction, Hypomobility, Impaired sensation  Visit Diagnosis: Cervicalgia  Mid back pain     Problem List Patient Active Problem List   Diagnosis Date Noted  . Neck pain, chronic 05/02/2019  . Encounter for gynecological examination with Papanicolaou smear of cervix 03/07/2019  . Routine cervical smear 03/07/2019  . Screening for colorectal cancer 03/07/2019  . Mass of upper inner quadrant of right breast 03/07/2019  . Papanicolaou smear of cervix with positive high risk human papilloma virus (HPV) test 03/07/2019  . Abnormal Pap smear of cervix 02/12/2019  . Breast mass in female 02/07/2019  . Poor appetite 09/08/2018  . Abdominal pain 05/31/2018  . Leukocytosis 05/31/2018  . Tobacco abuse 05/31/2018  . Cannabinoid hyperemesis syndrome 03/11/2018  . Intractable abdominal pain 03/07/2018  . Dermatitis 02/01/2018  . Hemorrhoids 02/22/2017  . Nicotine dependence, cigarettes, with other nicotine-induced  disorders 12/12/2016  . Thoracic spine pain 11/23/2016  . Nasal obstruction 11/23/2016  . Breast pain, right 03/23/2016  . Hyperlipemia 10/02/2015  . Premature ovarian failure 02/04/2015  . Pregnancy as incidental finding, low positive preg test, considered pituitary production of HCG 02/04/2015  . Need for influenza vaccination 09/30/2014  . Abdominal pain, epigastric 08/27/2014  . Vitamin D deficiency 05/16/2014  . Alopecia 08/01/2013  . Asthma, cough variant 12/01/2012  . 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 Marijuana use 11/14/2012  . Seasonal allergies 11/14/2012  . Nausea without vomiting 11/09/2012  . Constipation 12/02/2011  . Gastroparesis 05/01/2011  . Pyloric spasm/Stenosis 03/30/2011  . Hip pain, right 03/02/2011  . Anemia 01/30/2011  . Gastric outlet obstruction 01/01/2011  . Intractable vomiting 12/24/2010  . Hypokalemia 12/24/2010  . COPD (chronic obstructive pulmonary disease) ([place]) 09/09/2010  . GERD  (gastroesophageal reflux disease) 08/21/2010  . Headache 03/13/2008  . AMENORRHEA, SECONDARY 09/27/2007  . ECZEMA 09/27/2007  . Depression 07/01/2007  . Back pain with radiation 07/01/2007  . CLOSED FRACTURE OF METATARSAL BONE 01/27/2007   12:14 PM, 05/09/19 Jerene Pitch, DPT Physical Therapy with [facility]  [phone] office  [place], [place], [address] Phone: [phone]   Fax:  [phone]  Name: Annmarie Cherek Sylvain MRN: [id number] Date of Birth: 08/12/1980

## 2019-05-11 ENCOUNTER — Ambulatory Visit: Payer: Medicare HMO | Admitting: Family Medicine

## 2019-05-15 ENCOUNTER — Ambulatory Visit (HOSPITAL_COMMUNITY): Payer: Medicare HMO | Admitting: Physical Therapy

## 2019-05-15 ENCOUNTER — Encounter (HOSPITAL_COMMUNITY): Payer: Self-pay | Admitting: Physical Therapy

## 2019-05-15 ENCOUNTER — Other Ambulatory Visit: Payer: Self-pay

## 2019-05-15 DIAGNOSIS — M542 Cervicalgia: Secondary | ICD-10-CM | POA: Diagnosis not present

## 2019-05-15 DIAGNOSIS — M549 Dorsalgia, unspecified: Secondary | ICD-10-CM | POA: Diagnosis not present

## 2019-05-15 NOTE — Patient Instructions (Signed)
Access Code: [email or web address] URL: [email or web address] Date: 05/15/2019 Prepared by: Yetta Glassman  Exercises Supine Chin Tuck - 3 sets - 10 reps Supine Cervical Rotation AROM on Pillow - 3 sets - 10 reps Supine Lower Trunk Rotation - 1 x daily - 7 x weekly - 10 reps - 3 sets Diaphragmatic Breathing at 90/90 Supported Supine with Legs Supported at 90/90

## 2019-05-15 NOTE — Therapy (Signed)
[facility] [address] [place], [place], [address] Phone: [phone]   Fax:  [phone]  Physical Therapy Treatment  Patient Details  Name: Laverda Jolivette Gillette MRN: [id number] Date of Birth: Jul 14, 1980 Referring Provider (PT): Benny Lennert   Encounter Date: 05/15/2019  PT End of Session - 05/15/19 0829    Visit Number  2    Number of Visits  12    Date for PT Re-Evaluation  06/20/19   eval 4/20   Authorization Type  [place] with medicaid secondary. no VL, [employer] not covered. Copay $40.    Progress Note Due on Visit  10    PT Start Time  0830    PT Stop Time  0910    PT Time Calculation (min)  40 min    Activity Tolerance  Patient limited by pain    Behavior During Therapy  [facility] for tasks assessed/performed       Past Medical History:  Diagnosis Date  . Anemia of other chronic disease 11/29/2012  . Asthma   . Back pain, chronic   . Chronic abdominal pain   . Constipation   . COPD (chronic obstructive pulmonary disease) ([place])   . Cyst of skin    mid spine area  . Depression 2000   h/o suicidal ideation  . Gastric outlet obstruction   . Gastritis   . Gastroparesis   . Migraine   . Migraines   . Nausea and vomiting    recurrent  . Nicotine addiction   . Osteoporosis   . Peptic ulcer disease   . Pyloric spasm 03/30/2011  . Seasonal allergies   . Sinusitis   . Substance abuse ([place]) 2008   marijuana  . Vitamin D deficiency     Past Surgical History:  Procedure Laterality Date  . BALLOON DILATION N/A 02/09/2013   Procedure: BALLOON DILATION;  Surgeon: Missy Sabins, MD;  Location: [facility];  Service: Endoscopy;  Laterality: N/A;  . BALLOON DILATION N/A 03/10/2018   Procedure: BALLOON DILATION;  Surgeon: Daneil Dolin, MD;  [facility];  Service: Endoscopy;  Laterality: N/A;  . BILIARY DILATION  06/01/2018   Procedure: PYLORIC DILATION;  Surgeon: Rush Landmark Telford Nab., MD;  Location:  [facility];  Service: Gastroenterology;;  . BIOPSY  06/01/2018   Procedure: BIOPSY;  Surgeon: Irving Copas., MD;  Location: [facility] ENDOSCOPY;  Service: Gastroenterology;;  . Lum Keas INJECTION N/A 02/09/2013   Procedure: BOTOX INJECTION;  Surgeon: Missy Sabins, MD;  Location: [facility];  Service: Endoscopy;  Laterality: N/A;  possible balloon  . BOTOX INJECTION N/A 10/24/2015   Procedure: BOTOX INJECTION;  Surgeon: Daneil Dolin, MD;  [facility];  Service: Endoscopy;  Laterality: N/A;  . BOTOX INJECTION N/A 03/10/2018   Procedure: BOTOX INJECTION;  Surgeon: Daneil Dolin, MD;  [facility];  Service: Endoscopy;  Laterality: N/A;  Melanie notified  . BOTOX INJECTION  06/01/2018   Procedure: BOTOX INJECTION;  Surgeon: Rush Landmark Telford Nab., MD;  Location: [facility];  Service: Gastroenterology;;  . CARPAL TUNNEL RELEASE     left hand  . COLONOSCOPY WITH PROPOFOL N/A 09/20/2014   RMR: Normal ileo-colonoscopy  . ESOPHAGEAL DILATION  12/25/2010   Procedure: ESOPHAGEAL DILATION;  Surgeon: Dorothyann Peng, MD;  [facility];  Service: Endoscopy;;  . ESOPHAGEAL MANOMETRY N/A 10/03/2018   Procedure: ESOPHAGEAL MANOMETRY (EM);  Surgeon: Mauri Pole, MD;  Location: [facility];  Service: Endoscopy;  Laterality: N/A;  . ESOPHAGOGASTRODUODENOSCOPY  12/25/2010   Dorothyann Peng, MD;  moderate gastritis, ?goo secondary to pylorspasm. BX showed reactive gstropathy no h.pyori, SB mucosa with intramucosal lymphocytosis and partial villous blunting (TTG 4.0 normal)  . ESOPHAGOGASTRODUODENOSCOPY N/A 11/14/2012   [id number] hiatal hernia. Abnormal gastric mucosa of  uncertain significance-status post biopsy. Subjectively, patient may have recurrent symptomatic, pylorospasm.  . ESOPHAGOGASTRODUODENOSCOPY (EGD) WITH PROPOFOL N/A 02/09/2013   elongated stomach, partial lower esophageal ring widely patent. No obvious pyloric stenosis s/p Botox  .  ESOPHAGOGASTRODUODENOSCOPY (EGD) WITH PROPOFOL N/A 10/24/2015   Procedure: ESOPHAGOGASTRODUODENOSCOPY (EGD) WITH PROPOFOL;  Surgeon: Daneil Dolin, MD;  [facility];  Service: Endoscopy;  Laterality: N/A;  830   . ESOPHAGOGASTRODUODENOSCOPY (EGD) WITH PROPOFOL N/A 03/10/2018   Dr. Gala Romney: normal esophagus, elongated, dilated stomach likely stigmata of slow gastric emptying due to narrowing of pyloric channel, s/p balloon dilatation of pyloric channel. Small hiatal hernia, normal duodenal bulb and second portion of duodenum  . ESOPHAGOGASTRODUODENOSCOPY (EGD) WITH PROPOFOL N/A 06/01/2018   Procedure: ESOPHAGOGASTRODUODENOSCOPY (EGD) WITH PROPOFOL;  Surgeon: Rush Landmark Telford Nab., MD;  Location: [facility];  Service: Gastroenterology;  Laterality: N/A;  . LAPAROSCOPIC CHOLECYSTECTOMY  2002   [facility] Na?  [place]?  . laser surgery on cervix    . NASAL SEPTUM SURGERY  01/29/2017  . TOE SURGERY      There were no vitals filed for this visit.  Subjective Assessment - 05/15/19 0837    Subjective  States that after last session she had pains in her legs but it resolved by the next day and that it calmed down back to baseline. States she has been doing her breathing techniques and heat. 5/10 on the right side of her neck.    Pertinent History  COPD, depression, chronic back and neck pain, migraines    Limitations  Standing;Lifting;Reading;House hold activities    Patient Stated Goals  to have less pain    Currently in Pain?  Yes    Pain Location  Neck    Pain Orientation  Right    Pain Descriptors / Indicators  Aching;Tightness;Tingling    Pain Type  Chronic pain    Pain Radiating Towards  down into right arm    Pain Onset  More than a month ago         [facility] PT Assessment - 05/15/19 0001      Assessment   Medical Diagnosis  neck pain     Referring Provider (PT)  Benny Lennert                   [facility] Adult PT Treatment/Exercise - 05/15/19 0001      Neck  Exercises: Supine   Other Supine Exercise  cervical ROM - protraction/retraction/rotation - with deep breathing and to tolerance. - tolerated well with breathing; Lumbar rotationin 90/90 position with legs elevated    Other Supine Exercise  diaphragmatic breathing - paired with heat - 6 minutes      Modalities   Modalities  Moist Heat      Moist Heat Therapy   Number Minutes Moist Heat  15 Minutes    Moist Heat Location  Cervical   during deep breathing exercises            PT Education - 05/15/19 0913    Education Details  on gradual movement, on painful movements and how the body responds to injury/pain    Person(s) Educated  Patient  Methods  Explanation    Comprehension  Verbalized understanding       PT Short Term Goals - 05/09/19 1117      PT SHORT TERM GOAL #1   Title  Patient will be independent in self management strategies to improve quality of life and functional outcomes.    Time  3    Period  Weeks    Status  New    Target Date  05/30/19      PT SHORT TERM GOAL #2   Title  Patient will report at least 25% improvement in overall symptoms and/or functional ability.    Time  3    Period  Weeks    Status  New    Target Date  05/30/19      PT SHORT TERM GOAL #3   Title  Patient will be able to tolerate light touch to demonstrate decreased hypersensitivity to light tough.    Time  3    Period  Weeks    Status  New    Target Date  05/30/19        PT Long Term Goals - 05/09/19 1119      PT LONG TERM GOAL #1   Title  Patient will score with < 41% limitation on FOTO to demonstrate improved overall function.    Time  6    Period  Weeks    Status  New    Target Date  06/20/19      PT LONG TERM GOAL #2   Title  Patient will be able to demonstrate painfree cervical mobility to demonstrate improved gross cervical ROM.    Time  6    Period  Weeks    Status  New    Target Date  06/20/19      PT LONG TERM GOAL #3   Title  Patient will report at  least 50% improvement in overall symptoms and/or functional ability.    Time  6    Period  Weeks    Status  New    Target Date  06/20/19            Plan - 05/15/19 0914    Clinical Impression Statement  Focused today on gentle AROM of cervical and lumbar spine. Cued patient to perform deep breathes and educated patient on non-painful movements as well as typical movements she performs throughout the day that are not painful for reference. Patient reported less pain and tension end of session. Added cervical mobility and lumbar mobility exercises to HEP. Will continue to add in gentle AROM exercises with breathing and heat to desensitize central nervous system to movement.    Personal Factors and Comorbidities  Comorbidity 1;Comorbidity 2    Comorbidities  depression, chronic migraines, COPD    Examination-Activity Limitations  Sleep;Bed Mobility;Bathing;Sit;Carry;Stand;Bend;Lift;Reach Overhead    Examination-Participation Restrictions  Meal Prep;Community Activity;Cleaning    Stability/Clinical Decision Making  Stable/Uncomplicated    Rehab Potential  Fair    PT Frequency  2x / week    PT Duration  6 weeks    PT Treatment/Interventions  ADLs/Self Care Home Management;Cryotherapy;Electrical Stimulation;Therapeutic exercise;Balance training;Traction;Moist Heat;Therapeutic activities;Functional mobility training;Gait training;DME Instruction;Neuromuscular re-education;Patient/family education;Manual techniques;Dry needling;Passive range of motion;Joint Manipulations;Taping    PT Next Visit Plan  pain management strategies - heat/percussion gun, deep breathing exercises, cross midline movements, gentle STM once tolerated, ROM    PT Home Exercise Plan  4/20 deep breathing exercises; 4/26 cervical ROM and lumbar ROM with breathing  Patient will benefit from skilled therapeutic intervention in order to improve the following deficits and impairments:  Pain, Decreased activity tolerance,  Decreased mobility, Decreased range of motion, Postural dysfunction, Hypomobility, Impaired sensation  Visit Diagnosis: Cervicalgia  Mid back pain     Problem List Patient Active Problem List   Diagnosis Date Noted  . Neck pain, chronic 05/02/2019  . Encounter for gynecological examination with Papanicolaou smear of cervix 03/07/2019  . Routine cervical smear 03/07/2019  . Screening for colorectal cancer 03/07/2019  . Mass of upper inner quadrant of right breast 03/07/2019  . Papanicolaou smear of cervix with positive high risk human papilloma virus (HPV) test 03/07/2019  . Abnormal Pap smear of cervix 02/12/2019  . Breast mass in female 02/07/2019  . Poor appetite 09/08/2018  . Abdominal pain 05/31/2018  . Leukocytosis 05/31/2018  . Tobacco abuse 05/31/2018  . Cannabinoid hyperemesis syndrome 03/11/2018  . Intractable abdominal pain 03/07/2018  . Dermatitis 02/01/2018  . Hemorrhoids 02/22/2017  . Nicotine dependence, cigarettes, with other nicotine-induced disorders 12/12/2016  . Thoracic spine pain 11/23/2016  . Nasal obstruction 11/23/2016  . Breast pain, right 03/23/2016  . Hyperlipemia 10/02/2015  . Premature ovarian failure 02/04/2015  . Pregnancy as incidental finding, low positive preg test, considered pituitary production of HCG 02/04/2015  . Need for influenza vaccination 09/30/2014  . Abdominal pain, epigastric 08/27/2014  . Vitamin D deficiency 05/16/2014  . Alopecia 08/01/2013  . Asthma, cough variant 12/01/2012  . 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 Marijuana use 11/14/2012  . Seasonal allergies 11/14/2012  . Nausea without vomiting 11/09/2012  . Constipation 12/02/2011  . Gastroparesis 05/01/2011  . Pyloric spasm/Stenosis 03/30/2011  . Hip pain, right 03/02/2011  . Anemia 01/30/2011  . Gastric outlet obstruction 01/01/2011  . Intractable vomiting 12/24/2010  . Hypokalemia 12/24/2010  . COPD (chronic obstructive pulmonary  disease) ([place]) 09/09/2010  . GERD (gastroesophageal reflux disease) 08/21/2010  . Headache 03/13/2008  . AMENORRHEA, SECONDARY 09/27/2007  . ECZEMA 09/27/2007  . Depression 07/01/2007  . Back pain with radiation 07/01/2007  . CLOSED FRACTURE OF METATARSAL BONE 01/27/2007   9:15 AM, 05/15/19 Jerene Pitch, DPT Physical Therapy with [facility]  [phone] office  [facility] [address] [place], [place], [address] Phone: [phone]   Fax:  [phone]  Name: Kayonna Stefanich Reyez MRN: [id number] Date of Birth: 09-15-1980

## 2019-05-16 ENCOUNTER — Telehealth (HOSPITAL_COMMUNITY): Payer: Self-pay | Admitting: Physical Therapy

## 2019-05-16 ENCOUNTER — Ambulatory Visit (HOSPITAL_COMMUNITY): Payer: Medicare HMO | Admitting: Physical Therapy

## 2019-05-16 NOTE — Telephone Encounter (Signed)
[facility] #1 Called patient and patient stated she had canceled today's appointment yesterday while she was in office. Confirmed next appointment with patient.   4:29 PM, 05/16/19 Tracey Daniel, DPT Physical Therapy with [facility]  [phone] office

## 2019-05-17 DIAGNOSIS — M791 Myalgia, unspecified site: Secondary | ICD-10-CM | POA: Diagnosis not present

## 2019-05-17 DIAGNOSIS — G43719 Chronic migraine without aura, intractable, without status migrainosus: Secondary | ICD-10-CM | POA: Diagnosis not present

## 2019-05-17 DIAGNOSIS — G518 Other disorders of facial nerve: Secondary | ICD-10-CM | POA: Diagnosis not present

## 2019-05-17 DIAGNOSIS — M542 Cervicalgia: Secondary | ICD-10-CM | POA: Diagnosis not present

## 2019-05-23 ENCOUNTER — Encounter (HOSPITAL_COMMUNITY): Payer: Self-pay | Admitting: Physical Therapy

## 2019-05-23 ENCOUNTER — Ambulatory Visit (HOSPITAL_COMMUNITY): Payer: Medicare HMO | Attending: Family Medicine | Admitting: Physical Therapy

## 2019-05-23 ENCOUNTER — Other Ambulatory Visit: Payer: Self-pay

## 2019-05-23 DIAGNOSIS — M542 Cervicalgia: Secondary | ICD-10-CM | POA: Diagnosis not present

## 2019-05-23 DIAGNOSIS — M549 Dorsalgia, unspecified: Secondary | ICD-10-CM | POA: Diagnosis not present

## 2019-05-23 NOTE — Therapy (Signed)
[facility] [address] [place], [place], [address] Phone: [phone]   Fax:  [phone]  Physical Therapy Treatment  Patient Details  Name: Tracey Daniel MRN: [id number] Date of Birth: 08-16-1980 Referring Provider (PT): Benny Lennert   Encounter Date: 05/23/2019  PT End of Session - 05/23/19 0828    Visit Number  3    Number of Visits  12    Date for PT Re-Evaluation  06/20/19   eval 4/20   Authorization Type  [place] medicare [place] with medicaid secondary. no VL, Auth-Aquatic not covered. Copay $40.    Progress Note Due on Visit  10    PT Start Time  0829    PT Stop Time  0909    PT Time Calculation (min)  40 min    Activity Tolerance  Patient limited by pain    Behavior During Therapy  [facility] for tasks assessed/performed       Past Medical History:  Diagnosis Date  . Anemia of other chronic disease 11/29/2012  . Asthma   . Back pain, chronic   . Chronic abdominal pain   . Constipation   . COPD (chronic obstructive pulmonary disease) ([place])   . Cyst of skin    mid spine area  . Depression 2000   h/o suicidal ideation  . Gastric outlet obstruction   . Gastritis   . Gastroparesis   . Migraine   . Migraines   . Nausea and vomiting    recurrent  . Nicotine addiction   . Osteoporosis   . Peptic ulcer disease   . Pyloric spasm 03/30/2011  . Seasonal allergies   . Sinusitis   . Substance abuse ([place]) 2008   marijuana  . Vitamin D deficiency     Past Surgical History:  Procedure Laterality Date  . BALLOON DILATION N/A 02/09/2013   Procedure: BALLOON DILATION;  Surgeon: Missy Sabins, MD;  [facility];  Service: Endoscopy;  Laterality: N/A;  . BALLOON DILATION N/A 03/10/2018   Procedure: BALLOON DILATION;  Surgeon: Daneil Dolin, MD;  [facility];  Service: Endoscopy;  Laterality: N/A;  . BILIARY DILATION  06/01/2018   Procedure: PYLORIC DILATION;  Surgeon: Rush Landmark Telford Nab., MD;  Location: [place];  Service: Gastroenterology;;  . BIOPSY  06/01/2018   Procedure: BIOPSY;  Surgeon: Irving Copas., MD;  Location: [facility]COPY;  Service: Gastroenterology;;  . Lum Keas INJECTION N/A 02/09/2013   Procedure: BOTOX INJECTION;  Surgeon: Missy Sabins, MD;  [facility];  Service: Endoscopy;  Laterality: N/A;  possible balloon  . BOTOX INJECTION N/A 10/24/2015   Procedure: BOTOX INJECTION;  Surgeon: Daneil Dolin, MD;  [facility];  Service: Endoscopy;  Laterality: N/A;  . BOTOX INJECTION N/A 03/10/2018   Procedure: BOTOX INJECTION;  Surgeon: Daneil Dolin, MD;  [facility];  Service: Endoscopy;  Laterality: N/A;  Melanie notified  . BOTOX INJECTION  06/01/2018   Procedure: BOTOX INJECTION;  Surgeon: Rush Landmark Telford Nab., MD;  Location: [place];  Service: Gastroenterology;;  . CARPAL TUNNEL RELEASE     left hand  . COLONOSCOPY WITH PROPOFOL N/A 09/20/2014   RMR: Normal ileo-colonoscopy  . ESOPHAGEAL DILATION  12/25/2010   Procedure: ESOPHAGEAL DILATION;  Surgeon: Dorothyann Peng, MD;  [facility];  Service: Endoscopy;;  . ESOPHAGEAL MANOMETRY N/A 10/03/2018   Procedure: ESOPHAGEAL MANOMETRY (EM);  Surgeon: Mauri Pole, MD;  [facility];  Service: Endoscopy;  Laterality: N/A;  . ESOPHAGOGASTRODUODENOSCOPY  12/25/2010   Dorothyann Peng, MD;  moderate gastritis, ?goo secondary to pylorspasm. BX showed reactive gstropathy no h.pyori, SB mucosa with intramucosal lymphocytosis and partial villous blunting (TTG 4.0 normal)  . ESOPHAGOGASTRODUODENOSCOPY N/A 11/14/2012   [id number] hiatal hernia. Abnormal gastric mucosa of  uncertain significance-status post biopsy. Subjectively, patient may have recurrent symptomatic, pylorospasm.  . ESOPHAGOGASTRODUODENOSCOPY (EGD) WITH PROPOFOL N/A 02/09/2013   elongated stomach, partial lower esophageal ring widely patent. No obvious pyloric stenosis s/p Botox  . ESOPHAGOGASTRODUODENOSCOPY  (EGD) WITH PROPOFOL N/A 10/24/2015   Procedure: ESOPHAGOGASTRODUODENOSCOPY (EGD) WITH PROPOFOL;  Surgeon: Daneil Dolin, MD;  [facility];  Service: Endoscopy;  Laterality: N/A;  830   . ESOPHAGOGASTRODUODENOSCOPY (EGD) WITH PROPOFOL N/A 03/10/2018   Dr. Gala Romney: normal esophagus, elongated, dilated stomach likely stigmata of slow gastric emptying due to narrowing of pyloric channel, s/p balloon dilatation of pyloric channel. Small hiatal hernia, normal duodenal bulb and second portion of duodenum  . ESOPHAGOGASTRODUODENOSCOPY (EGD) WITH PROPOFOL N/A 06/01/2018   Procedure: ESOPHAGOGASTRODUODENOSCOPY (EGD) WITH PROPOFOL;  Surgeon: Rush Landmark Telford Nab., MD;  Location: [place];  Service: Gastroenterology;  Laterality: N/A;  . LAPAROSCOPIC CHOLECYSTECTOMY  2002   Forestine Na?  [place]?  . laser surgery on cervix    . NASAL SEPTUM SURGERY  01/29/2017  . TOE SURGERY      There were no vitals filed for this visit.  Subjective Assessment - 05/23/19 0833    Subjective  States that her pain is better, mostly at bra line neck is feeling better. Reports 4/10 pain. Neck and leg exercises really helping. Still using heat occasionally.    Pertinent History  COPD, depression, chronic back and neck pain, migraines    Limitations  Standing;Lifting;Reading;House hold activities    Patient Stated Goals  to have less pain    Currently in Pain?  Yes    Pain Score  4     Pain Location  Back    Pain Orientation  Upper;Mid    Pain Descriptors / Indicators  Aching;Tightness    Pain Type  Chronic pain    Pain Onset  More than a month ago         [facility] PT Assessment - 05/23/19 0001      Assessment   Medical Diagnosis  neck pain     Referring Provider (PT)  Benny Lennert                   [facility] Adult PT Treatment/Exercise - 05/23/19 0001      Neck Exercises: Standing   Other Standing Exercises  towel roll down spine W's x15 - 5" holds - stopped - pain with standing    Other  Standing Exercises  doorway pec stretch 20" holds - x5 B      Neck Exercises: Seated   Other Seated Exercise  seated on exercise ball - bounces for rhytmic bouncing for decreased resting muscle tone x4 for 60"s between pelvic tilts anterior/posteiror x4 60" holds each. then lateral tilts (left side limited in ROM) x4 60" with bouncing in between for active rest     Other Seated Exercise  scapular protraction stretch seated - x5 breaths x4 total               PT Short Term Goals - 05/09/19 1117      PT SHORT TERM GOAL #1   Title  Patient will be independent in self management strategies  to improve quality of life and functional outcomes.    Time  3    Period  Weeks    Status  New    Target Date  05/30/19      PT SHORT TERM GOAL #2   Title  Patient will report at least 25% improvement in overall symptoms and/or functional ability.    Time  3    Period  Weeks    Status  New    Target Date  05/30/19      PT SHORT TERM GOAL #3   Title  Patient will be able to tolerate light touch to demonstrate decreased hypersensitivity to light tough.    Time  3    Period  Weeks    Status  New    Target Date  05/30/19        PT Long Term Goals - 05/09/19 1119      PT LONG TERM GOAL #1   Title  Patient will score with < 41% limitation on FOTO to demonstrate improved overall function.    Time  6    Period  Weeks    Status  New    Target Date  06/20/19      PT LONG TERM GOAL #2   Title  Patient will be able to demonstrate painfree cervical mobility to demonstrate improved gross cervical ROM.    Time  6    Period  Weeks    Status  New    Target Date  06/20/19      PT LONG TERM GOAL #3   Title  Patient will report at least 50% improvement in overall symptoms and/or functional ability.    Time  6    Period  Weeks    Status  New    Target Date  06/20/19            Plan - 05/23/19 [id number]    Clinical Impression Statement  Patient reports she can't stand too long periods of  time. Transitioned to seated on exercise ball and this was tolerated very well. Improvement in pelvic mobility noted with each set and decreased pain with bounces on ball and with repetition of ball exercises. Improved pelvic mobility with repetition. Less pain but increased fatigue noted end of session.    Personal Factors and Comorbidities  Comorbidity 1;Comorbidity 2    Comorbidities  depression, chronic migraines, COPD    Examination-Activity Limitations  Sleep;Bed Mobility;Bathing;Sit;Carry;Stand;Bend;Lift;Reach Overhead    Examination-Participation Restrictions  Meal Prep;Community Activity;Cleaning    Stability/Clinical Decision Making  Stable/Uncomplicated    Rehab Potential  Fair    PT Frequency  2x / week    PT Duration  6 weeks    PT Treatment/Interventions  ADLs/Self Care Home Management;Cryotherapy;Electrical Stimulation;Therapeutic exercise;Balance training;Traction;Moist Heat;Therapeutic activities;Functional mobility training;Gait training;DME Instruction;Neuromuscular re-education;Patient/family education;Manual techniques;Dry needling;Passive range of motion;Joint Manipulations;Taping    PT Next Visit Plan  f/u with ball exercises, review tilts, progress to balance on ball, time exercises with breath. pain management strategies - heat/percussion gun, deep breathing exercises, cross midline movements, gentle STM once tolerated, ROM    PT Home Exercise Plan  4/20 deep breathing exercises; 4/26 cervical ROM and lumbar ROM with breathing; 5/4 protraction stretch of shoulder blades       Patient will benefit from skilled therapeutic intervention in order to improve the following deficits and impairments:  Pain, Decreased activity tolerance, Decreased mobility, Decreased range of motion, Postural dysfunction, Hypomobility, Impaired sensation  Visit Diagnosis: Cervicalgia  Mid back  pain     Problem List Patient Active Problem List   Diagnosis Date Noted  . Neck pain, chronic  05/02/2019  . Encounter for gynecological examination with Papanicolaou smear of cervix 03/07/2019  . Routine cervical smear 03/07/2019  . Screening for colorectal cancer 03/07/2019  . Mass of upper inner quadrant of right breast 03/07/2019  . Papanicolaou smear of cervix with positive high risk human papilloma virus (HPV) test 03/07/2019  . Abnormal Pap smear of cervix 02/12/2019  . Breast mass in female 02/07/2019  . Poor appetite 09/08/2018  . Abdominal pain 05/31/2018  . Leukocytosis 05/31/2018  . Tobacco abuse 05/31/2018  . Cannabinoid hyperemesis syndrome 03/11/2018  . Intractable abdominal pain 03/07/2018  . Dermatitis 02/01/2018  . Hemorrhoids 02/22/2017  . Nicotine dependence, cigarettes, with other nicotine-induced disorders 12/12/2016  . Thoracic spine pain 11/23/2016  . Nasal obstruction 11/23/2016  . Breast pain, right 03/23/2016  . Hyperlipemia 10/02/2015  . Premature ovarian failure 02/04/2015  . Pregnancy as incidental finding, low positive preg test, considered pituitary production of HCG 02/04/2015  . Need for influenza vaccination 09/30/2014  . Abdominal pain, epigastric 08/27/2014  . Vitamin D deficiency 05/16/2014  . Alopecia 08/01/2013  . Asthma, cough variant 12/01/2012  . 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 Marijuana use 11/14/2012  . Seasonal allergies 11/14/2012  . Nausea without vomiting 11/09/2012  . Constipation 12/02/2011  . Gastroparesis 05/01/2011  . Pyloric spasm/Stenosis 03/30/2011  . Hip pain, right 03/02/2011  . Anemia 01/30/2011  . Gastric outlet obstruction 01/01/2011  . Intractable vomiting 12/24/2010  . Hypokalemia 12/24/2010  . COPD (chronic obstructive pulmonary disease) ([place]) 09/09/2010  . GERD (gastroesophageal reflux disease) 08/21/2010  . Headache 03/13/2008  . AMENORRHEA, SECONDARY 09/27/2007  . ECZEMA 09/27/2007  . Depression 07/01/2007  . Back pain with radiation 07/01/2007  . CLOSED FRACTURE  OF METATARSAL BONE 01/27/2007   9:09 AM, 05/23/19 Jerene Pitch, DPT Physical Therapy with [facility]  [phone] office  [place], [place], [address] Phone: [phone]   Fax:  [phone]  Name: Tracey Daniel MRN: [id number] Date of Birth: Feb 14, 1980

## 2019-05-26 ENCOUNTER — Encounter (HOSPITAL_COMMUNITY): Payer: Self-pay | Admitting: Physical Therapy

## 2019-05-26 ENCOUNTER — Other Ambulatory Visit: Payer: Self-pay

## 2019-05-26 ENCOUNTER — Ambulatory Visit (HOSPITAL_COMMUNITY): Payer: Medicare HMO | Admitting: Physical Therapy

## 2019-05-26 DIAGNOSIS — M542 Cervicalgia: Secondary | ICD-10-CM

## 2019-05-26 DIAGNOSIS — M549 Dorsalgia, unspecified: Secondary | ICD-10-CM

## 2019-05-26 NOTE — Therapy (Signed)
[facility] [address] [place], [place], [address] Phone: [phone]   Fax:  [phone]  Physical Therapy Treatment  Patient Details  Name: Flannery Smithart Dillehay MRN: [id number] Date of Birth: 08/18/1980 Referring Provider (PT): Benny Lennert   Encounter Date: 05/26/2019  PT End of Session - 05/26/19 0836    Visit Number  4    Number of Visits  12    Date for PT Re-Evaluation  06/20/19   eval 4/20   Authorization Type  [place] medicare [place] with medicaid secondary. no VL, Auth-Aquatic not covered. Copay $40.    Progress Note Due on Visit  10    PT Start Time  0830    PT Stop Time  0910    PT Time Calculation (min)  40 min    Activity Tolerance  Patient limited by pain    Behavior During Therapy  [facility] for tasks assessed/performed       Past Medical History:  Diagnosis Date  . Anemia of other chronic disease 11/29/2012  . Asthma   . Back pain, chronic   . Chronic abdominal pain   . Constipation   . COPD (chronic obstructive pulmonary disease) ([place])   . Cyst of skin    mid spine area  . Depression 2000   h/o suicidal ideation  . Gastric outlet obstruction   . Gastritis   . Gastroparesis   . Migraine   . Migraines   . Nausea and vomiting    recurrent  . Nicotine addiction   . Osteoporosis   . Peptic ulcer disease   . Pyloric spasm 03/30/2011  . Seasonal allergies   . Sinusitis   . Substance abuse ([place]) 2008   marijuana  . Vitamin D deficiency     Past Surgical History:  Procedure Laterality Date  . BALLOON DILATION N/A 02/09/2013   Procedure: BALLOON DILATION;  Surgeon: Missy Sabins, MD;  [facility];  Service: Endoscopy;  Laterality: N/A;  . BALLOON DILATION N/A 03/10/2018   Procedure: BALLOON DILATION;  Surgeon: Daneil Dolin, MD;  [facility];  Service: Endoscopy;  Laterality: N/A;  . BILIARY DILATION  06/01/2018   Procedure: PYLORIC DILATION;  Surgeon: Rush Landmark Telford Nab., MD;  Location: [place];  Service: Gastroenterology;;  . BIOPSY  06/01/2018   Procedure: BIOPSY;  Surgeon: Irving Copas., MD;  Location: [facility];  Service: Gastroenterology;;  . Lum Keas INJECTION N/A 02/09/2013   Procedure: BOTOX INJECTION;  Surgeon: Missy Sabins, MD;  [facility];  Service: Endoscopy;  Laterality: N/A;  possible balloon  . BOTOX INJECTION N/A 10/24/2015   Procedure: BOTOX INJECTION;  Surgeon: Daneil Dolin, MD;  [facility];  Service: Endoscopy;  Laterality: N/A;  . BOTOX INJECTION N/A 03/10/2018   Procedure: BOTOX INJECTION;  Surgeon: Daneil Dolin, MD;  [facility];  Service: Endoscopy;  Laterality: N/A;  Melanie notified  . BOTOX INJECTION  06/01/2018   Procedure: BOTOX INJECTION;  Surgeon: Rush Landmark Telford Nab., MD;  Location: [facility];  Service: Gastroenterology;;  . CARPAL TUNNEL RELEASE     left hand  . COLONOSCOPY WITH PROPOFOL N/A 09/20/2014   RMR: Normal ileo-colonoscopy  . ESOPHAGEAL DILATION  12/25/2010   Procedure: ESOPHAGEAL DILATION;  Surgeon: Dorothyann Peng, MD;  [facility];  Service: Endoscopy;;  . ESOPHAGEAL MANOMETRY N/A 10/03/2018   Procedure: ESOPHAGEAL MANOMETRY (EM);  Surgeon: Mauri Pole, MD;  [facility];  Service: Endoscopy;  Laterality: N/A;  . ESOPHAGOGASTRODUODENOSCOPY  12/25/2010   Dorothyann Peng, MD;  moderate gastritis, ?goo secondary to pylorspasm. BX showed reactive gstropathy no h.pyori, SB mucosa with intramucosal lymphocytosis and partial villous blunting (TTG 4.0 normal)  . ESOPHAGOGASTRODUODENOSCOPY N/A 11/14/2012   [id number] hiatal hernia. Abnormal gastric mucosa of  uncertain significance-status post biopsy. Subjectively, patient may have recurrent symptomatic, pylorospasm.  . ESOPHAGOGASTRODUODENOSCOPY (EGD) WITH PROPOFOL N/A 02/09/2013   elongated stomach, partial lower esophageal ring widely patent. No obvious pyloric stenosis s/p Botox  . ESOPHAGOGASTRODUODENOSCOPY  (EGD) WITH PROPOFOL N/A 10/24/2015   Procedure: ESOPHAGOGASTRODUODENOSCOPY (EGD) WITH PROPOFOL;  Surgeon: Daneil Dolin, MD;  [facility];  Service: Endoscopy;  Laterality: N/A;  830   . ESOPHAGOGASTRODUODENOSCOPY (EGD) WITH PROPOFOL N/A 03/10/2018   Dr. Gala Romney: normal esophagus, elongated, dilated stomach likely stigmata of slow gastric emptying due to narrowing of pyloric channel, s/p balloon dilatation of pyloric channel. Small hiatal hernia, normal duodenal bulb and second portion of duodenum  . ESOPHAGOGASTRODUODENOSCOPY (EGD) WITH PROPOFOL N/A 06/01/2018   Procedure: ESOPHAGOGASTRODUODENOSCOPY (EGD) WITH PROPOFOL;  Surgeon: Rush Landmark Telford Nab., MD;  Location: [facility];  Service: Gastroenterology;  Laterality: N/A;  . LAPAROSCOPIC CHOLECYSTECTOMY  2002   Forestine Na?  [place]?  . laser surgery on cervix    . NASAL SEPTUM SURGERY  01/29/2017  . TOE SURGERY      There were no vitals filed for this visit.  Subjective Assessment - 05/26/19 0833    Subjective  States that her pain in her hips are really bothering her today. States pain is 9/10. States that her neck is only about 5/10 currently    Pertinent History  COPD, depression, chronic back and neck pain, migraines    Limitations  Standing;Lifting;Reading;House hold activities    Patient Stated Goals  to have less pain    Currently in Pain?  Yes    Pain Score  9     Pain Location  Hip    Pain Orientation  Right;Posterior    Pain Onset  More than a month ago         [facility] PT Assessment - 05/26/19 0001      Assessment   Medical Diagnosis  neck pain     Referring Provider (PT)  Benny Lennert                   [facility] Adult PT Treatment/Exercise - 05/26/19 0001      Exercises   Exercises  Lumbar      Neck Exercises: Seated   Other Seated Exercise  glute sets with posture hols 3x10 5" holds       Lumbar Exercises: Stretches   Single Knee to Chest Stretch  5 reps;60 seconds;Left;Right   with towel    Lower Trunk Rotation  4 reps;60 seconds   duing heat     Lumbar Exercises: Seated   Other Seated Lumbar Exercises  hip add isometrics 3x10 5" holds      Lumbar Exercises: Supine   Other Supine Lumbar Exercises  pelvic tilts x6 60"  bouts anterior/posterior      Moist Heat Therapy   Number Minutes Moist Heat  15 Minutes    Moist Heat Location  Lumbar Spine   during supine exercises            PT Education - 05/26/19 0847    Education Details  on how gentle ROM can help with arthritic pain and how it can decrease  pain/symptoms    Person(s) Educated  Patient    Methods  Explanation    Comprehension  Verbalized understanding       PT Short Term Goals - 05/09/19 1117      PT SHORT TERM GOAL #1   Title  Patient will be independent in self management strategies to improve quality of life and functional outcomes.    Time  3    Period  Weeks    Status  New    Target Date  05/30/19      PT SHORT TERM GOAL #2   Title  Patient will report at least 25% improvement in overall symptoms and/or functional ability.    Time  3    Period  Weeks    Status  New    Target Date  05/30/19      PT SHORT TERM GOAL #3   Title  Patient will be able to tolerate light touch to demonstrate decreased hypersensitivity to light tough.    Time  3    Period  Weeks    Status  New    Target Date  05/30/19        PT Long Term Goals - 05/09/19 1119      PT LONG TERM GOAL #1   Title  Patient will score with < 41% limitation on FOTO to demonstrate improved overall function.    Time  6    Period  Weeks    Status  New    Target Date  06/20/19      PT LONG TERM GOAL #2   Title  Patient will be able to demonstrate painfree cervical mobility to demonstrate improved gross cervical ROM.    Time  6    Period  Weeks    Status  New    Target Date  06/20/19      PT LONG TERM GOAL #3   Title  Patient will report at least 50% improvement in overall symptoms and/or functional ability.    Time  6     Period  Weeks    Status  New    Target Date  06/20/19            Plan - 05/26/19 0836    Clinical Impression Statement  Focused on hip/lumbar mobility secondary to severe pain that limited participation in cervical exercises. Able to tolerate and add posture exercises after thermotherapy and lumbar mobility. Less pain noted end of session with patient reporting 5/10 pain. Will follow up and continue mobility, breathing exercises and gentle isometrics to help down regulate CNS and improve overall mobility and tolerance to functional movements.    Personal Factors and Comorbidities  Comorbidity 1;Comorbidity 2    Comorbidities  depression, chronic migraines, COPD    Examination-Activity Limitations  Sleep;Bed Mobility;Bathing;Sit;Carry;Stand;Bend;Lift;Reach Overhead    Examination-Participation Restrictions  Meal Prep;Community Activity;Cleaning    Stability/Clinical Decision Making  Stable/Uncomplicated    Rehab Potential  Fair    PT Frequency  2x / week    PT Duration  6 weeks    PT Treatment/Interventions  ADLs/Self Care Home Management;Cryotherapy;Electrical Stimulation;Therapeutic exercise;Balance training;Traction;Moist Heat;Therapeutic activities;Functional mobility training;Gait training;DME Instruction;Neuromuscular re-education;Patient/family education;Manual techniques;Dry needling;Passive range of motion;Joint Manipulations;Taping    PT Next Visit Plan  f/u with ball exercises, review tilts, progress to balance on ball, time exercises with breath. pain management strategies - heat/percussion gun, deep breathing exercises, cross midline movements, gentle STM once tolerated, ROM    PT Home Exercise Plan  4/20 deep  breathing exercises; 4/26 cervical ROM and lumbar ROM with breathing; 5/4 protraction stretch of shoulder blades; 5/7 glute and hip add isometrics seated       Patient will benefit from skilled therapeutic intervention in order to improve the following deficits and  impairments:  Pain, Decreased activity tolerance, Decreased mobility, Decreased range of motion, Postural dysfunction, Hypomobility, Impaired sensation  Visit Diagnosis: Cervicalgia  Mid back pain     Problem List Patient Active Problem List   Diagnosis Date Noted  . Neck pain, chronic 05/02/2019  . Encounter for gynecological examination with Papanicolaou smear of cervix 03/07/2019  . Routine cervical smear 03/07/2019  . Screening for colorectal cancer 03/07/2019  . Mass of upper inner quadrant of right breast 03/07/2019  . Papanicolaou smear of cervix with positive high risk human papilloma virus (HPV) test 03/07/2019  . Abnormal Pap smear of cervix 02/12/2019  . Breast mass in female 02/07/2019  . Poor appetite 09/08/2018  . Abdominal pain 05/31/2018  . Leukocytosis 05/31/2018  . Tobacco abuse 05/31/2018  . Cannabinoid hyperemesis syndrome 03/11/2018  . Intractable abdominal pain 03/07/2018  . Dermatitis 02/01/2018  . Hemorrhoids 02/22/2017  . Nicotine dependence, cigarettes, with other nicotine-induced disorders 12/12/2016  . Thoracic spine pain 11/23/2016  . Nasal obstruction 11/23/2016  . Breast pain, right 03/23/2016  . Hyperlipemia 10/02/2015  . Premature ovarian failure 02/04/2015  . Pregnancy as incidental finding, low positive preg test, considered pituitary production of HCG 02/04/2015  . Need for influenza vaccination 09/30/2014  . Abdominal pain, epigastric 08/27/2014  . Vitamin D deficiency 05/16/2014  . Alopecia 08/01/2013  . Asthma, cough variant 12/01/2012  . 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 Marijuana use 11/14/2012  . Seasonal allergies 11/14/2012  . Nausea without vomiting 11/09/2012  . Constipation 12/02/2011  . Gastroparesis 05/01/2011  . Pyloric spasm/Stenosis 03/30/2011  . Hip pain, right 03/02/2011  . Anemia 01/30/2011  . Gastric outlet obstruction 01/01/2011  . Intractable vomiting 12/24/2010  . Hypokalemia  12/24/2010  . COPD (chronic obstructive pulmonary disease) ([place]) 09/09/2010  . GERD (gastroesophageal reflux disease) 08/21/2010  . Headache 03/13/2008  . AMENORRHEA, SECONDARY 09/27/2007  . ECZEMA 09/27/2007  . Depression 07/01/2007  . Back pain with radiation 07/01/2007  . CLOSED FRACTURE OF METATARSAL BONE 01/27/2007  9:12 AM, 05/26/19 Jerene Pitch, DPT Physical Therapy with [facility]  [phone] office  [place] [address] [place], [place], [address] Phone: [phone]   Fax:  [phone]  Name: Bliss Chynoweth Wadsworth MRN: [id number] Date of Birth: 1980/06/20

## 2019-05-29 ENCOUNTER — Telehealth (HOSPITAL_COMMUNITY): Payer: Self-pay | Admitting: Physical Therapy

## 2019-05-29 NOTE — Telephone Encounter (Signed)
pt cancelled appts for Tues. and Thurs. because she has no transportation

## 2019-05-30 ENCOUNTER — Ambulatory Visit (HOSPITAL_COMMUNITY): Payer: Medicare HMO | Admitting: Physical Therapy

## 2019-06-01 ENCOUNTER — Ambulatory Visit (HOSPITAL_COMMUNITY): Payer: Medicare HMO | Admitting: Physical Therapy

## 2019-06-06 ENCOUNTER — Ambulatory Visit (HOSPITAL_COMMUNITY): Payer: Medicare HMO | Admitting: Physical Therapy

## 2019-06-08 ENCOUNTER — Other Ambulatory Visit: Payer: Self-pay

## 2019-06-08 ENCOUNTER — Encounter (HOSPITAL_COMMUNITY): Payer: Self-pay | Admitting: Physical Therapy

## 2019-06-08 ENCOUNTER — Ambulatory Visit (HOSPITAL_COMMUNITY): Payer: Medicare HMO | Admitting: Physical Therapy

## 2019-06-08 DIAGNOSIS — M542 Cervicalgia: Secondary | ICD-10-CM | POA: Diagnosis not present

## 2019-06-08 DIAGNOSIS — M549 Dorsalgia, unspecified: Secondary | ICD-10-CM | POA: Diagnosis not present

## 2019-06-08 NOTE — Patient Instructions (Signed)
Access Code: [email or web address] URL: [email or web address] Date: 06/08/2019 Prepared by: Yetta Glassman  Exercises Prone Hip Internal Rotation AROM - 5 sets - 5 reps - 5 hold Hooklying Clamshell with Resistance - 5 sets - 5 reps - 5 hold Seated Hip Abduction with Resistance - 5 sets - 5 reps - 5 hold Prone Knee Flexion - 3 sets - 10 reps - 5 hold

## 2019-06-08 NOTE — Therapy (Signed)
[facility] [address] [place], [place], [address] Phone: [phone]   Fax:  [phone]  Physical Therapy Treatment, Progress Note and Discharge Note  Patient Details  Name: Tracey Daniel MRN: [id number] Date of Birth: 10-10-1980 Referring Provider (PT): Benny Lennert Progress Note Reporting Period 05/09/19 to 06/08/19  See note below for Objective Data and Assessment of Progress/Goals.        PHYSICAL THERAPY DISCHARGE SUMMARY  Visits from Start of Care: 5  Current functional level related to goals / functional outcomes: 3/3 STG and 2/3 LTG met    Remaining deficits: Continued pain in neck and back   Education / Equipment: See below Plan: Patient agrees to discharge.  Patient goals were partially met. Patient is being discharged due to being pleased with the current functional level.  ?????       Encounter Date: 06/08/2019  PT End of Session - 06/08/19 0918    Visit Number  5    Number of Visits  12    Date for PT Re-Evaluation  06/20/19   eval 4/20   Authorization Type  [place] medicare [place] with medicaid secondary. no VL, [employer] not covered. Copay $40.    Progress Note Due on Visit  10    PT Start Time  0916    PT Stop Time  0954    PT Time Calculation (min)  38 min    Activity Tolerance  Patient limited by pain    Behavior During Therapy  [facility] for tasks assessed/performed       Past Medical History:  Diagnosis Date  . Anemia of other chronic disease 11/29/2012  . Asthma   . Back pain, chronic   . Chronic abdominal pain   . Constipation   . COPD (chronic obstructive pulmonary disease) ([place])   . Cyst of skin    mid spine area  . Depression 2000   h/o suicidal ideation  . Gastric outlet obstruction   . Gastritis   . Gastroparesis   . Migraine   . Migraines   . Nausea and vomiting    recurrent  . Nicotine addiction   . Osteoporosis   . Peptic ulcer disease   . Pyloric spasm 03/30/2011  .  Seasonal allergies   . Sinusitis   . Substance abuse ([place]) 2008   marijuana  . Vitamin D deficiency     Past Surgical History:  Procedure Laterality Date  . BALLOON DILATION N/A 02/09/2013   Procedure: BALLOON DILATION;  Surgeon: Missy Sabins, MD;  Location: [facility];  Service: Endoscopy;  Laterality: N/A;  . BALLOON DILATION N/A 03/10/2018   Procedure: BALLOON DILATION;  Surgeon: Daneil Dolin, MD;  [facility];  Service: Endoscopy;  Laterality: N/A;  . BILIARY DILATION  06/01/2018   Procedure: PYLORIC DILATION;  Surgeon: Rush Landmark Telford Nab., MD;  Location: [place];  Service: Gastroenterology;;  . BIOPSY  06/01/2018   Procedure: BIOPSY;  Surgeon: Irving Copas., MD;  Location: [facility] ENDOSCOPY;  Service: Gastroenterology;;  . Lum Keas INJECTION N/A 02/09/2013   Procedure: BOTOX INJECTION;  Surgeon: Missy Sabins, MD;  Location: [facility];  Service: Endoscopy;  Laterality: N/A;  possible balloon  . BOTOX INJECTION N/A 10/24/2015   Procedure: BOTOX INJECTION;  Surgeon: Daneil Dolin, MD;  [facility];  Service: Endoscopy;  Laterality: N/A;  . BOTOX INJECTION N/A 03/10/2018   Procedure: BOTOX INJECTION;  Surgeon: Daneil Dolin, MD;  [facility];  Service: Endoscopy;  Laterality: N/A;  Melanie notified  . BOTOX INJECTION  06/01/2018   Procedure: BOTOX INJECTION;  Surgeon: Rush Landmark Telford Nab., MD;  Location: [place];  Service: Gastroenterology;;  . CARPAL TUNNEL RELEASE     left hand  . COLONOSCOPY WITH PROPOFOL N/A 09/20/2014   RMR: Normal ileo-colonoscopy  . ESOPHAGEAL DILATION  12/25/2010   Procedure: ESOPHAGEAL DILATION;  Surgeon: Dorothyann Peng, MD;  [facility];  Service: Endoscopy;;  . ESOPHAGEAL MANOMETRY N/A 10/03/2018   Procedure: ESOPHAGEAL MANOMETRY (EM);  Surgeon: Mauri Pole, MD;  Location: [facility];  Service: Endoscopy;  Laterality: N/A;  . ESOPHAGOGASTRODUODENOSCOPY  12/25/2010   Dorothyann Peng, MD;  moderate gastritis, ?goo secondary to pylorspasm. BX showed reactive gstropathy no h.pyori, SB mucosa with intramucosal lymphocytosis and partial villous blunting (TTG 4.0 normal)  . ESOPHAGOGASTRODUODENOSCOPY N/A 11/14/2012   TWS:FKCLE hiatal hernia. Abnormal gastric mucosa of  uncertain significance-status post biopsy. Subjectively, patient may have recurrent symptomatic, pylorospasm.  . ESOPHAGOGASTRODUODENOSCOPY (EGD) WITH PROPOFOL N/A 02/09/2013   elongated stomach, partial lower esophageal ring widely patent. No obvious pyloric stenosis s/p Botox  . ESOPHAGOGASTRODUODENOSCOPY (EGD) WITH PROPOFOL N/A 10/24/2015   Procedure: ESOPHAGOGASTRODUODENOSCOPY (EGD) WITH PROPOFOL;  Surgeon: Daneil Dolin, MD;  [facility];  Service: Endoscopy;  Laterality: N/A;  830   . ESOPHAGOGASTRODUODENOSCOPY (EGD) WITH PROPOFOL N/A 03/10/2018   Dr. Gala Romney: normal esophagus, elongated, dilated stomach likely stigmata of slow gastric emptying due to narrowing of pyloric channel, s/p balloon dilatation of pyloric channel. Small hiatal hernia, normal duodenal bulb and second portion of duodenum  . ESOPHAGOGASTRODUODENOSCOPY (EGD) WITH PROPOFOL N/A 06/01/2018   Procedure: ESOPHAGOGASTRODUODENOSCOPY (EGD) WITH PROPOFOL;  Surgeon: Rush Landmark Telford Nab., MD;  Location: [facility];  Service: Gastroenterology;  Laterality: N/A;  . LAPAROSCOPIC CHOLECYSTECTOMY  2002   [place]ine Na?  [place]?  . laser surgery on cervix    . NASAL SEPTUM SURGERY  01/29/2017  . TOE SURGERY      There were no vitals filed for this visit.  Subjective Assessment - 06/08/19 0936    Subjective  States she is feeling better, has occasional hiccups in her neck but otherwise good. Reports 100% of the things she wants and is 3/10 pain in the neck on the right side and the back is also doing well. States she has been doing her exercises every day and the leg and neck exercises really help while pelvic ones are challenging.     Pertinent History  COPD, depression, chronic back and neck pain, migraines    Limitations  Standing;Lifting;Reading;House hold activities    Patient Stated Goals  to have less pain    Currently in Pain?  Yes    Pain Score  3     Pain Location  Back    Pain Orientation  Right;Posterior    Pain Descriptors / Indicators  Aching    Pain Type  Chronic pain    Pain Onset  More than a month ago         [facility] PT Assessment - 06/08/19 0001      Assessment   Medical Diagnosis  neck pain     Referring Provider (PT)  Benny Lennert      Cognition   Overall Cognitive Status  Within Functional Limits for tasks assessed      Observation/Other Assessments   Focus on Therapeutic Outcomes (FOTO)   35% limited   was 55% limited     AROM  Cervical Flexion  60   minor pain on right side of neck   Cervical Extension  60   no change in symptoms   Cervical - Right Side Bend  WNL   minor change on right side of neck    Cervical - Left Side Bend  WNL   no change in symptoms   Cervical - Right Rotation  80   no change in symptoms   Cervical - Left Rotation  80   no change in symptoms     Palpation   Palpation comment  able to tolerate light to moderate touch throuhgout cervical musculature                    [facility] Adult PT Treatment/Exercise - 06/08/19 0001      Lumbar Exercises: Seated   Other Seated Lumbar Exercises  hip abd isometrics wiht RTB 5x5 5" holds B       Lumbar Exercises: Supine   Other Supine Lumbar Exercises  hip abd isometrics in hook lying 5x5 5" holds      Lumbar Exercises: Prone   Other Prone Lumbar Exercises  hip IR isometrics RTB 5x5, B    Other Prone Lumbar Exercises  knee flexion 3x10 B 5" holds             PT Education - 06/08/19 0935    Education Details  Educated patient in FOTO score, current condition, progress, goals achieved and reviewed HEP, answered all questions prior to DC    Person(s) Educated  Patient    Methods  Explanation     Comprehension  Verbalized understanding       PT Short Term Goals - 06/08/19 0928      PT SHORT TERM GOAL #1   Title  Patient will be independent in self management strategies to improve quality of life and functional outcomes.    Time  3    Period  Weeks    Status  Achieved    Target Date  05/30/19      PT SHORT TERM GOAL #2   Title  Patient will report at least 25% improvement in overall symptoms and/or functional ability.    Baseline  100%    Time  3    Period  Weeks    Status  Achieved    Target Date  05/30/19      PT SHORT TERM GOAL #3   Title  Patient will be able to tolerate light touch to demonstrate decreased hypersensitivity to light tough.    Time  3    Period  Weeks    Status  Achieved    Target Date  05/30/19        PT Long Term Goals - 06/08/19 0929      PT LONG TERM GOAL #1   Title  Patient will score with < 41% limitation on FOTO to demonstrate improved overall function.    Baseline  35% limitation    Time  6    Period  Weeks    Status  Achieved      PT LONG TERM GOAL #2   Title  Patient will be able to demonstrate painfree cervical mobility to demonstrate improved gross cervical ROM.    Baseline  full motion, slight pain with flexion and right side bending    Time  6    Period  Weeks    Status  Partially Met      PT LONG TERM GOAL #3  Title  Patient will report at least 50% improvement in overall symptoms and/or functional ability.    Baseline  100% symptoms    Time  6    Period  Weeks    Status  Achieved            Plan - 06/08/19 8563    Clinical Impression Statement  Patient present for a progress note on this date. She has made great progress and has met 3/3 short term goals and 2/3 long term goals. Cervical ROM is still painful but pain is significantly decreased and patient has full ROM in all directions. Reviewed home program and answered all questions. Patient happy with progress and would like to be discharged at this time.  Patient to discharge from PT to HEP secondary to progress made.    Personal Factors and Comorbidities  Comorbidity 1;Comorbidity 2    Comorbidities  depression, chronic migraines, COPD    Examination-Activity Limitations  Sleep;Bed Mobility;Bathing;Sit;Carry;Stand;Bend;Lift;Reach Overhead    Examination-Participation Restrictions  Meal Prep;Community Activity;Cleaning    Stability/Clinical Decision Making  Stable/Uncomplicated    Rehab Potential  Fair    PT Frequency  2x / week    PT Duration  6 weeks    PT Treatment/Interventions  ADLs/Self Care Home Management;Cryotherapy;Electrical Stimulation;Therapeutic exercise;Balance training;Traction;Moist Heat;Therapeutic activities;Functional mobility training;Gait training;DME Instruction;Neuromuscular re-education;Patient/family education;Manual techniques;Dry needling;Passive range of motion;Joint Manipulations;Taping    PT Next Visit Plan  pt to discharge from PT to HEP at this time.    PT Home Exercise Plan  4/20 deep breathing exercises; 4/26 cervical ROM and lumbar ROM with breathing; 5/4 protraction stretch of shoulder blades; 5/7 glute and hip add isometrics seated; 5/20 hip IR iso, hip abd iso, knee flexion prone.       Patient will benefit from skilled therapeutic intervention in order to improve the following deficits and impairments:  Pain, Decreased activity tolerance, Decreased mobility, Decreased range of motion, Postural dysfunction, Hypomobility, Impaired sensation  Visit Diagnosis: Cervicalgia  Mid back pain     Problem List Patient Active Problem List   Diagnosis Date Noted  . Neck pain, chronic 05/02/2019  . Encounter for gynecological examination with Papanicolaou smear of cervix 03/07/2019  . Routine cervical smear 03/07/2019  . Screening for colorectal cancer 03/07/2019  . Mass of upper inner quadrant of right breast 03/07/2019  . Papanicolaou smear of cervix with positive high risk human papilloma virus (HPV) test  03/07/2019  . Abnormal Pap smear of cervix 02/12/2019  . Breast mass in female 02/07/2019  . Poor appetite 09/08/2018  . Abdominal pain 05/31/2018  . Leukocytosis 05/31/2018  . Tobacco abuse 05/31/2018  . Cannabinoid hyperemesis syndrome 03/11/2018  . Intractable abdominal pain 03/07/2018  . Dermatitis 02/01/2018  . Hemorrhoids 02/22/2017  . Nicotine dependence, cigarettes, with other nicotine-induced disorders 12/12/2016  . Thoracic spine pain 11/23/2016  . Nasal obstruction 11/23/2016  . Breast pain, right 03/23/2016  . Hyperlipemia 10/02/2015  . Premature ovarian failure 02/04/2015  . Pregnancy as incidental finding, low positive preg test, considered pituitary production of HCG 02/04/2015  . Need for influenza vaccination 09/30/2014  . Abdominal pain, epigastric 08/27/2014  . Vitamin D deficiency 05/16/2014  . Alopecia 08/01/2013  . Asthma, cough variant 12/01/2012  . 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 Marijuana use 11/14/2012  . Seasonal allergies 11/14/2012  . Nausea without vomiting 11/09/2012  . Constipation 12/02/2011  . Gastroparesis 05/01/2011  . Pyloric spasm/Stenosis 03/30/2011  . Hip pain, right 03/02/2011  . Anemia 01/30/2011  .  Gastric outlet obstruction 01/01/2011  . Intractable vomiting 12/24/2010  . Hypokalemia 12/24/2010  . COPD (chronic obstructive pulmonary disease) ([place]) 09/09/2010  . GERD (gastroesophageal reflux disease) 08/21/2010  . Headache 03/13/2008  . AMENORRHEA, SECONDARY 09/27/2007  . ECZEMA 09/27/2007  . Depression 07/01/2007  . Back pain with radiation 07/01/2007  . CLOSED FRACTURE OF METATARSAL BONE 01/27/2007   9:56 AM, 06/08/19 Jerene Pitch, DPT Physical Therapy with [facility]  [phone] office  [facility] [address] [place], [place], [address] Phone: [phone]   Fax:  [phone]  Name: Brookelin Felber Koelzer MRN:  [id number] Date of Birth: 05/17/1980

## 2019-06-13 ENCOUNTER — Encounter (HOSPITAL_COMMUNITY): Payer: Self-pay | Admitting: Physical Therapy

## 2019-06-15 ENCOUNTER — Other Ambulatory Visit: Payer: Self-pay | Admitting: Obstetrics and Gynecology

## 2019-06-15 ENCOUNTER — Encounter (HOSPITAL_COMMUNITY): Payer: Self-pay | Admitting: Physical Therapy

## 2019-06-23 DIAGNOSIS — F411 Generalized anxiety disorder: Secondary | ICD-10-CM | POA: Diagnosis not present

## 2019-06-23 DIAGNOSIS — F333 Major depressive disorder, recurrent, severe with psychotic symptoms: Secondary | ICD-10-CM | POA: Diagnosis not present

## 2019-06-29 DIAGNOSIS — M791 Myalgia, unspecified site: Secondary | ICD-10-CM | POA: Diagnosis not present

## 2019-06-29 DIAGNOSIS — M542 Cervicalgia: Secondary | ICD-10-CM | POA: Diagnosis not present

## 2019-06-29 DIAGNOSIS — G43719 Chronic migraine without aura, intractable, without status migrainosus: Secondary | ICD-10-CM | POA: Diagnosis not present

## 2019-06-29 DIAGNOSIS — G518 Other disorders of facial nerve: Secondary | ICD-10-CM | POA: Diagnosis not present

## 2019-07-14 ENCOUNTER — Other Ambulatory Visit: Payer: Self-pay | Admitting: Family Medicine

## 2019-08-08 ENCOUNTER — Ambulatory Visit (INDEPENDENT_AMBULATORY_CARE_PROVIDER_SITE_OTHER): Payer: Medicare HMO | Admitting: Family Medicine

## 2019-08-08 ENCOUNTER — Other Ambulatory Visit: Payer: Self-pay

## 2019-08-08 ENCOUNTER — Encounter: Payer: Self-pay | Admitting: Family Medicine

## 2019-08-08 VITALS — BP 113/74 | HR 68 | Resp 16 | Ht 66.0 in | Wt 157.0 lb

## 2019-08-08 DIAGNOSIS — F32A Depression, unspecified: Secondary | ICD-10-CM

## 2019-08-08 DIAGNOSIS — K219 Gastro-esophageal reflux disease without esophagitis: Secondary | ICD-10-CM | POA: Diagnosis not present

## 2019-08-08 DIAGNOSIS — G518 Other disorders of facial nerve: Secondary | ICD-10-CM | POA: Diagnosis not present

## 2019-08-08 DIAGNOSIS — F17210 Nicotine dependence, cigarettes, uncomplicated: Secondary | ICD-10-CM

## 2019-08-08 DIAGNOSIS — F329 Major depressive disorder, single episode, unspecified: Secondary | ICD-10-CM

## 2019-08-08 DIAGNOSIS — R630 Anorexia: Secondary | ICD-10-CM

## 2019-08-08 DIAGNOSIS — Z720 Tobacco use: Secondary | ICD-10-CM | POA: Diagnosis not present

## 2019-08-08 DIAGNOSIS — E785 Hyperlipidemia, unspecified: Secondary | ICD-10-CM

## 2019-08-08 DIAGNOSIS — M542 Cervicalgia: Secondary | ICD-10-CM | POA: Diagnosis not present

## 2019-08-08 DIAGNOSIS — G44009 Cluster headache syndrome, unspecified, not intractable: Secondary | ICD-10-CM | POA: Diagnosis not present

## 2019-08-08 DIAGNOSIS — G43719 Chronic migraine without aura, intractable, without status migrainosus: Secondary | ICD-10-CM | POA: Diagnosis not present

## 2019-08-08 DIAGNOSIS — M791 Myalgia, unspecified site: Secondary | ICD-10-CM | POA: Diagnosis not present

## 2019-08-08 MED ORDER — CLOTRIMAZOLE-BETAMETHASONE 1-0.05 % EX CREA: TOPICAL_CREAM | CUTANEOUS | 0 refills | Status: DC

## 2019-08-08 MED ORDER — NICOTINE 7 MG/24HR TD PT24: 7.0000 mg | MEDICATED_PATCH | Freq: Every day | TRANSDERMAL | 1 refills | Status: DC

## 2019-08-08 MED ORDER — MEGESTROL ACETATE 20 MG PO TABS: 20.0000 mg | ORAL_TABLET | Freq: Every day | ORAL | 2 refills | Status: DC

## 2019-08-08 NOTE — Patient Instructions (Addendum)
F/u with MD re evaluate chronic conditions,   Bone density to be scheduled at checkout  Reduced dose megace to  20 mg daily  Glad you are down to 4 cigarettes/ day, QUIT date is set for August 20, 2019. Nicoderm patches are prescribed , start after you stop smoking on August 1  Call 1`800QUIT NOW to help you to quit  Antifungal cream around left eye            Steps to Quit Smoking Smoking tobacco is the leading cause of preventable death. It can affect almost every organ in the body. Smoking puts you and people around you at risk for many serious, long-lasting (chronic) diseases. Quitting smoking can be hard, but it is one of the best things that you can do for your health. It is never too late to quit. How do I get ready to quit? When you decide to quit smoking, make a plan to help you succeed. Before you quit:  Pick a date to quit. Set a date within the next 2 weeks to give you time to prepare.  Write down the reasons why you are quitting. Keep this list in places where you will see it often.  Tell your family, friends, and co-workers that you are quitting. Their support is important.  Talk with your doctor about the choices that may help you quit.  Find out if your health insurance will pay for these treatments.  Know the people, places, things, and activities that make you want to smoke (triggers). Avoid them. What first steps can I take to quit smoking?  Throw away all cigarettes at home, at work, and in your car.  Throw away the things that you use when you smoke, such as ashtrays and lighters.  Clean your car. Make sure to empty the ashtray.  Clean your home, including curtains and carpets. What can I do to help me quit smoking? Talk with your doctor about taking medicines and seeing a counselor at the same time. You are more likely to succeed when you do both.  If you are pregnant or breastfeeding, talk with your doctor about counseling or other ways to quit  smoking. Do not take medicine to help you quit smoking unless your doctor tells you to do so. To quit smoking: Quit right away  Quit smoking totally, instead of slowly cutting back on how much you smoke over a period of time.  Go to counseling. You are more likely to quit if you go to counseling sessions regularly. Take medicine You may take medicines to help you quit. Some medicines need a prescription, and some you can buy over-the-counter. Some medicines may contain a drug called nicotine to replace the nicotine in cigarettes. Medicines may:  Help you to stop having the desire to smoke (cravings).  Help to stop the problems that come when you stop smoking (withdrawal symptoms). Your doctor may ask you to use:  Nicotine patches, gum, or lozenges.  Nicotine inhalers or sprays.  Non-nicotine medicine that is taken by mouth. Find resources Find resources and other ways to help you quit smoking and remain smoke-free after you quit. These resources are most helpful when you use them often. They include:  Online chats with a Social worker.  Phone quitlines.  Printed Furniture conservator/restorer.  Support groups or group counseling.  Text messaging programs.  Mobile phone apps. Use apps on your mobile phone or tablet that can help you stick to your quit plan. There are many free  apps for mobile phones and tablets as well as websites. Examples include Quit Guide from the [employer] and smokefree.gov  What things can I do to make it easier to quit?   Talk to your family and friends. Ask them to support and encourage you.  Call a phone quitline (1-800-QUIT-NOW), reach out to support groups, or work with a Social worker.  Ask people who smoke to not smoke around you.  Avoid places that make you want to smoke, such as: ? Bars. ? Parties. ? Smoke-break areas at work.  Spend time with people who do not smoke.  Lower the stress in your life. Stress can make you want to smoke. Try these things to help  your stress: ? Getting regular exercise. ? Doing deep-breathing exercises. ? Doing yoga. ? Meditating. ? Doing a body scan. To do this, close your eyes, focus on one area of your body at a time from head to toe. Notice which parts of your body are tense. Try to relax the muscles in those areas. How will I feel when I quit smoking? Day 1 to 3 weeks Within the first 24 hours, you may start to have some problems that come from quitting tobacco. These problems are very bad 2-3 days after you quit, but they do not often last for more than 2-3 weeks. You may get these symptoms:  Mood swings.  Feeling restless, nervous, angry, or annoyed.  Trouble concentrating.  Dizziness.  Strong desire for high-sugar foods and nicotine.  Weight gain.  Trouble pooping (constipation).  Feeling like you may vomit (nausea).  Coughing or a sore throat.  Changes in how the medicines that you take for other issues work in your body.  Depression.  Trouble sleeping (insomnia). Week 3 and afterward After the first 2-3 weeks of quitting, you may start to notice more positive results, such as:  Better sense of smell and taste.  Less coughing and sore throat.  Slower heart rate.  Lower blood pressure.  Clearer skin.  Better breathing.  Fewer sick days. Quitting smoking can be hard. Do not give up if you fail the first time. Some people need to try a few times before they succeed. Do your best to stick to your quit plan, and talk with your doctor if you have any questions or concerns. Summary  Smoking tobacco is the leading cause of preventable death. Quitting smoking can be hard, but it is one of the best things that you can do for your health.  When you decide to quit smoking, make a plan to help you succeed.  Quit smoking right away, not slowly over a period of time.  When you start quitting, seek help from your doctor, family, or friends. This information is not intended to replace advice  given to you by your health care provider. Make sure you discuss any questions you have with your health care provider. Document Revised: 09/30/2018 Document Reviewed: 03/26/2018 Elsevier Patient Education  [place].

## 2019-08-12 ENCOUNTER — Encounter: Payer: Self-pay | Admitting: Family Medicine

## 2019-08-12 NOTE — Assessment & Plan Note (Signed)
Hyperlipidemia:Low fat diet discussed and encouraged.   Lipid Panel  Lab Results  Component Value Date   CHOL 151 11/11/2018   HDL 42 (L) 11/11/2018   LDLCALC 90 11/11/2018   TRIG 93 11/11/2018   CHOLHDL 3.6 11/11/2018  needs to increase exercise Controlled, no change in medication

## 2019-08-12 NOTE — Assessment & Plan Note (Signed)
Marked improvement, now at ideal body weight , reduce dose of megace

## 2019-08-12 NOTE — Assessment & Plan Note (Signed)
Controlled and managed by Psych, no med changes

## 2019-08-12 NOTE — Progress Notes (Signed)
   Tracey Daniel     MRN: [id number]      DOB: 04/10/1980   HPI [name] is here for follow up and re-evaluation of chronic medical conditions, medication management and review of any available recent lab and radiology data.  Preventive health is updated, specifically  Cancer screening and Immunization.   Questions or concerns regarding consultations or procedures which the PT has had in the interim are  addressed. The PT denies any adverse reactions to current medications since the last visit.  There are no new concerns.  There are no specific complaints   ROS Denies recent fever or chills. Denies sinus pressure, nasal congestion, ear pain or sore throat. Denies chest congestion, productive cough or wheezing. Denies chest pains, palpitations and leg swelling Denies abdominal pain, nausea, vomiting,diarrhea or constipation.   Denies dysuria, frequency, hesitancy or incontinence. Denies joint pain, swelling and limitation in mobility. Denies headaches, seizures, numbness, or tingling. Denies depression, anxiety or insomnia. Denies skin break down or rash.   PE  BP 113/74   Pulse 68   Resp 16   Ht 5\' 6"  (1.676 m)   Wt 157 lb (71.2 kg)   LMP  (LMP Unknown) Comment: pt reports premature menopause  SpO2 99%   BMI 25.34 kg/m   Patient alert and oriented and in no cardiopulmonary distress.  HEENT: No facial asymmetry, EOMI,     Neck supple .  Chest: Clear to auscultation bilaterally.  CVS: S1, S2 no murmurs, no S3.Regular rate.  ABD: Soft non tender.   Ext: No edema  MS: Adequate ROM spine, shoulders, hips and knees.  Skin: Intact, no ulcerations or rash noted.  Psych: Good eye contact, normal affect. Memory intact not anxious or depressed appearing.  CNS: CN 2-12 intact, power,  normal throughout.no focal deficits noted.   Assessment & Plan  Tobacco abuse Asked:confirms currently smokes cigarettes 2 to 4/ day Assess: willing to quit , cutting back and  quit date set Advise: needs to QUIT to reduce risk of cancer, cardio and cerebrovascular disease Assist: counseled for 5 minutes and literature provided Arrange: follow up in 3 months   Depression Controlled and managed by Psych, no med changes  GERD (gastroesophageal reflux disease) Controlled, no change in medication   Hyperlipemia Hyperlipidemia:Low fat diet discussed and encouraged.   Lipid Panel  Lab Results  Component Value Date   CHOL 151 11/11/2018   HDL 42 (L) 11/11/2018   LDLCALC 90 11/11/2018   TRIG 93 11/11/2018   CHOLHDL 3.6 11/11/2018  needs to increase exercise Controlled, no change in medication      Headache Improved control, managed by Neurology, no med changes  Poor appetite Marked improvement, now at ideal body weight , reduce dose of megace

## 2019-08-12 NOTE — Assessment & Plan Note (Signed)
Asked:confirms currently smokes cigarettes 2 to 4/ day Assess: willing to quit , cutting back and quit date set Advise: needs to QUIT to reduce risk of cancer, cardio and cerebrovascular disease Assist: counseled for 5 minutes and literature provided Arrange: follow up in 3 months

## 2019-08-12 NOTE — Assessment & Plan Note (Signed)
Controlled, no change in medication  

## 2019-08-12 NOTE — Assessment & Plan Note (Signed)
Improved control, managed by Neurology, no med changes

## 2019-08-13 ENCOUNTER — Other Ambulatory Visit: Payer: Self-pay | Admitting: Family Medicine

## 2019-08-18 ENCOUNTER — Other Ambulatory Visit: Payer: Self-pay | Admitting: Obstetrics and Gynecology

## 2019-08-18 NOTE — Telephone Encounter (Signed)
prometrium x 14 days q 3 months refilled.

## 2019-08-23 ENCOUNTER — Ambulatory Visit: Payer: Medicare HMO | Admitting: Family Medicine

## 2019-08-24 ENCOUNTER — Other Ambulatory Visit: Payer: Self-pay | Admitting: Family Medicine

## 2019-08-25 ENCOUNTER — Other Ambulatory Visit: Payer: Self-pay | Admitting: Nurse Practitioner

## 2019-08-25 DIAGNOSIS — K3184 Gastroparesis: Secondary | ICD-10-CM

## 2019-09-08 DIAGNOSIS — F251 Schizoaffective disorder, depressive type: Secondary | ICD-10-CM | POA: Diagnosis not present

## 2019-09-12 DIAGNOSIS — G518 Other disorders of facial nerve: Secondary | ICD-10-CM | POA: Diagnosis not present

## 2019-09-12 DIAGNOSIS — G43719 Chronic migraine without aura, intractable, without status migrainosus: Secondary | ICD-10-CM | POA: Diagnosis not present

## 2019-09-12 DIAGNOSIS — M791 Myalgia, unspecified site: Secondary | ICD-10-CM | POA: Diagnosis not present

## 2019-09-12 DIAGNOSIS — M542 Cervicalgia: Secondary | ICD-10-CM | POA: Diagnosis not present

## 2019-09-20 ENCOUNTER — Other Ambulatory Visit: Payer: Self-pay | Admitting: Obstetrics and Gynecology

## 2019-10-05 ENCOUNTER — Ambulatory Visit (INDEPENDENT_AMBULATORY_CARE_PROVIDER_SITE_OTHER): Payer: Medicare HMO | Admitting: Family Medicine

## 2019-10-05 ENCOUNTER — Other Ambulatory Visit: Payer: Self-pay

## 2019-10-05 ENCOUNTER — Encounter: Payer: Self-pay | Admitting: Family Medicine

## 2019-10-05 VITALS — BP 127/77 | HR 90 | Resp 16 | Ht 66.0 in | Wt 154.0 lb

## 2019-10-05 DIAGNOSIS — F17210 Nicotine dependence, cigarettes, uncomplicated: Secondary | ICD-10-CM

## 2019-10-05 DIAGNOSIS — Z23 Encounter for immunization: Secondary | ICD-10-CM | POA: Diagnosis not present

## 2019-10-05 DIAGNOSIS — H6591 Unspecified nonsuppurative otitis media, right ear: Secondary | ICD-10-CM | POA: Diagnosis not present

## 2019-10-05 DIAGNOSIS — H6121 Impacted cerumen, right ear: Secondary | ICD-10-CM

## 2019-10-05 DIAGNOSIS — H6120 Impacted cerumen, unspecified ear: Secondary | ICD-10-CM | POA: Insufficient documentation

## 2019-10-05 DIAGNOSIS — Z720 Tobacco use: Secondary | ICD-10-CM

## 2019-10-05 DIAGNOSIS — H6691 Otitis media, unspecified, right ear: Secondary | ICD-10-CM | POA: Insufficient documentation

## 2019-10-05 MED ORDER — SULFAMETHOXAZOLE-TRIMETHOPRIM 800-160 MG PO TABS: 1.0000 | ORAL_TABLET | Freq: Two times a day (BID) | ORAL | 0 refills | Status: DC

## 2019-10-05 NOTE — Patient Instructions (Addendum)
F/u as before, call if you need me sooner  PLEASE do not stick anything including cotton in your ear.  5 day antibiotic course prescribed   Flu vaccine today  Thanks for choosing [facility], we consider it a privelige to serve you.   Steps to Quit Smoking Smoking tobacco is the leading cause of preventable death. It can affect almost every organ in the body. Smoking puts you and people around you at risk for many serious, long-lasting (chronic) diseases. Quitting smoking can be hard, but it is one of the best things that you can do for your health. It is never too late to quit. How do I get ready to quit? When you decide to quit smoking, make a plan to help you succeed. Before you quit:  Pick a date to quit. Set a date within the next 2 weeks to give you time to prepare.  Write down the reasons why you are quitting. Keep this list in places where you will see it often.  Tell your family, friends, and co-workers that you are quitting. Their support is important.  Talk with your doctor about the choices that may help you quit.  Find out if your health insurance will pay for these treatments.  Know the people, places, things, and activities that make you want to smoke (triggers). Avoid them. What first steps can I take to quit smoking?  Throw away all cigarettes at home, at work, and in your car.  Throw away the things that you use when you smoke, such as ashtrays and lighters.  Clean your car. Make sure to empty the ashtray.  Clean your home, including curtains and carpets. What can I do to help me quit smoking? Talk with your doctor about taking medicines and seeing a counselor at the same time. You are more likely to succeed when you do both.  If you are pregnant or breastfeeding, talk with your doctor about counseling or other ways to quit smoking. Do not take medicine to help you quit smoking unless your doctor tells you to do so. To quit smoking: Quit right  away  Quit smoking totally, instead of slowly cutting back on how much you smoke over a period of time.  Go to counseling. You are more likely to quit if you go to counseling sessions regularly. Take medicine You may take medicines to help you quit. Some medicines need a prescription, and some you can buy over-the-counter. Some medicines may contain a drug called nicotine to replace the nicotine in cigarettes. Medicines may:  Help you to stop having the desire to smoke (cravings).  Help to stop the problems that come when you stop smoking (withdrawal symptoms). Your doctor may ask you to use:  Nicotine patches, gum, or lozenges.  Nicotine inhalers or sprays.  Non-nicotine medicine that is taken by mouth. Find resources Find resources and other ways to help you quit smoking and remain smoke-free after you quit. These resources are most helpful when you use them often. They include:  Online chats with a Social worker.  Phone quitlines.  Printed Furniture conservator/restorer.  Support groups or group counseling.  Text messaging programs.  Mobile phone apps. Use apps on your mobile phone or tablet that can help you stick to your quit plan. There are many free apps for mobile phones and tablets as well as websites. Examples include Quit Guide from the [employer] and smokefree.gov  What things can I do to make it easier to quit?  Talk to your family and friends. Ask them to support and encourage you.  Call a phone quitline (1-800-QUIT-NOW), reach out to support groups, or work with a Social worker.  Ask people who smoke to not smoke around you.  Avoid places that make you want to smoke, such as: ? Bars. ? Parties. ? Smoke-break areas at work.  Spend time with people who do not smoke.  Lower the stress in your life. Stress can make you want to smoke. Try these things to help your stress: ? Getting regular exercise. ? Doing deep-breathing exercises. ? Doing yoga. ? Meditating. ? Doing a body  scan. To do this, close your eyes, focus on one area of your body at a time from head to toe. Notice which parts of your body are tense. Try to relax the muscles in those areas. How will I feel when I quit smoking? Day 1 to 3 weeks Within the first 24 hours, you may start to have some problems that come from quitting tobacco. These problems are very bad 2-3 days after you quit, but they do not often last for more than 2-3 weeks. You may get these symptoms:  Mood swings.  Feeling restless, nervous, angry, or annoyed.  Trouble concentrating.  Dizziness.  Strong desire for high-sugar foods and nicotine.  Weight gain.  Trouble pooping (constipation).  Feeling like you may vomit (nausea).  Coughing or a sore throat.  Changes in how the medicines that you take for other issues work in your body.  Depression.  Trouble sleeping (insomnia). Week 3 and afterward After the first 2-3 weeks of quitting, you may start to notice more positive results, such as:  Better sense of smell and taste.  Less coughing and sore throat.  Slower heart rate.  Lower blood pressure.  Clearer skin.  Better breathing.  Fewer sick days. Quitting smoking can be hard. Do not give up if you fail the first time. Some people need to try a few times before they succeed. Do your best to stick to your quit plan, and talk with your doctor if you have any questions or concerns. Summary  Smoking tobacco is the leading cause of preventable death. Quitting smoking can be hard, but it is one of the best things that you can do for your health.  When you decide to quit smoking, make a plan to help you succeed.  Quit smoking right away, not slowly over a period of time.  When you start quitting, seek help from your doctor, family, or friends. This information is not intended to replace advice given to you by your health care provider. Make sure you discuss any questions you have with your health care  provider. Document Revised: 09/30/2018 Document Reviewed: 03/26/2018 Elsevier Patient Education  [place].

## 2019-10-07 ENCOUNTER — Encounter: Payer: Self-pay | Admitting: Family Medicine

## 2019-10-07 NOTE — Assessment & Plan Note (Signed)
Successful ear flush by nursing,

## 2019-10-07 NOTE — Progress Notes (Signed)
   Tracey Daniel     MRN: [id number]      DOB: 01-17-1981   HPI Tracey Daniel is here with c/o right ear pain and states that cotton got stuck in her ear while trying to clean it. Denies hearing loss or drainage, nop fever, chills or sinus pressure ROS  Denies chest congestion, productive cough or wheezing. Denies chest pains, palpitations and leg swelling Denies abdominal pain, nausea, vomiting,diarrhea or constipation.   Denies dysuria, frequency, hesitancy or incontinence. Denies joint pain, swelling and limitation in mobility. Denies headaches, seizures, numbness, or tingling. Denies depression, anxiety or insomnia. Denies skin break down or rash.   PE  BP 127/77   Pulse 90   Resp 16   Ht 5\' 6"  (1.676 m)   Wt 154 lb (69.9 kg)   LMP  (LMP Unknown) Comment: pt reports premature menopause  SpO2 98%   BMI 24.86 kg/m   Patient alert and oriented and in no cardiopulmonary distress.  HEENT: No facial asymmetry, EOMI,     Neck supple .Left TM clear, Right TM occluded b  y wax and cotton, successfully flushed , residual erythema of right external ear and TM  Chest: Clear to auscultation bilaterally.  CVS: S1, S2 no murmurs, no S3.Regular rate.  ABD: Soft non tender.   Ext: No edema  MS: Adequate ROM spine, shoulders, hips and knees.  Skin: Intact, no ulcerations or rash noted.  Psych: Good eye contact, normal affect. Memory intact not anxious or depressed appearing.  CNS: CN 2-12 intact, power,  normal throughout.no focal deficits noted.   Assessment & Plan  Right otitis media Antibiotic course prescribed, advised against putting FB in ear, even for cleaning  Cerumen impaction Successful ear flush by nursing,   Tobacco abuse Asked:confirms currently smokes cigarettes approx 2 to 4 / day Assess: Unwilling to set a quit date, but is cutting back Advise: needs to QUIT to reduce risk of cancer, cardio and cerebrovascular disease Assist: counseled for 5 minutes  and literature provided Arrange: follow up in 2 to 4 months

## 2019-10-07 NOTE — Assessment & Plan Note (Signed)
Asked:confirms currently smokes cigarettes approx 2 to 4 / day Assess: Unwilling to set a quit date, but is cutting back Advise: needs to QUIT to reduce risk of cancer, cardio and cerebrovascular disease Assist: counseled for 5 minutes and literature provided Arrange: follow up in 2 to 4 months

## 2019-10-07 NOTE — Assessment & Plan Note (Signed)
Antibiotic course prescribed, advised against putting FB in ear, even for cleaning

## 2019-10-10 ENCOUNTER — Ambulatory Visit: Payer: Medicare HMO | Admitting: Family Medicine

## 2019-10-16 ENCOUNTER — Other Ambulatory Visit: Payer: Self-pay | Admitting: Family Medicine

## 2019-10-30 DIAGNOSIS — M542 Cervicalgia: Secondary | ICD-10-CM | POA: Diagnosis not present

## 2019-10-30 DIAGNOSIS — M791 Myalgia, unspecified site: Secondary | ICD-10-CM | POA: Diagnosis not present

## 2019-10-30 DIAGNOSIS — G518 Other disorders of facial nerve: Secondary | ICD-10-CM | POA: Diagnosis not present

## 2019-10-30 DIAGNOSIS — G43719 Chronic migraine without aura, intractable, without status migrainosus: Secondary | ICD-10-CM | POA: Diagnosis not present

## 2019-11-01 ENCOUNTER — Ambulatory Visit (INDEPENDENT_AMBULATORY_CARE_PROVIDER_SITE_OTHER): Payer: Medicare HMO | Admitting: Nurse Practitioner

## 2019-11-01 ENCOUNTER — Encounter: Payer: Self-pay | Admitting: Nurse Practitioner

## 2019-11-01 ENCOUNTER — Other Ambulatory Visit: Payer: Self-pay

## 2019-11-01 VITALS — BP 118/70 | HR 72 | Temp 97.3°F | Ht 66.0 in | Wt 156.0 lb

## 2019-11-01 DIAGNOSIS — R109 Unspecified abdominal pain: Secondary | ICD-10-CM

## 2019-11-01 DIAGNOSIS — K311 Adult hypertrophic pyloric stenosis: Secondary | ICD-10-CM | POA: Diagnosis not present

## 2019-11-01 DIAGNOSIS — K3184 Gastroparesis: Secondary | ICD-10-CM | POA: Diagnosis not present

## 2019-11-01 DIAGNOSIS — K5900 Constipation, unspecified: Secondary | ICD-10-CM | POA: Diagnosis not present

## 2019-11-01 DIAGNOSIS — K219 Gastro-esophageal reflux disease without esophagitis: Secondary | ICD-10-CM | POA: Diagnosis not present

## 2019-11-01 MED ORDER — PANTOPRAZOLE SODIUM 40 MG PO TBEC: 40.0000 mg | DELAYED_RELEASE_TABLET | Freq: Two times a day (BID) | ORAL | 3 refills | Status: DC

## 2019-11-01 NOTE — Patient Instructions (Addendum)
Your health issues we discussed today were:   GERD (reflux/heartburn): 1. Glad your symptoms are doing better! 2. As we discussed, I sent a refill of Protonix to your pharmacy 3. Continue taking Protonix twice a day as you have been 4. Let us know if you have any worsening or severe symptoms  Constipation: 1. I am glad Linzess is helping her constipation 2. Support 3. Continue taking Linzess daily 4. Let us know if you have any worsening constipation  Nausea and vomiting (likely due to stomach obstruction and gastroparesis/slow emptying stomach): 1. I am glad your symptoms are well managed at this time expansion point 2. Very pleasant 39 year old female with a complex history as outlined in HPI consisting of GERD, gastric outlet obstruction status post multiple EGDs with Botox injection, nausea and vomiting, gastroparesis, constipation continue taking Reglan as you have been 3. Let us know if any side effects from Reglan 4. Let us know if you have any worsening of your symptoms  Overall I recommend:  1. Continue your other current medications 2. Return for follow-up in 6 months 3. Call if you have any questions or concerns   ---------------------------------------------------------------  I am glad you have gotten your COVID-19 vaccination!  Even though you are fully vaccinated you should continue to follow CDC and state/local guidelines.  ---------------------------------------------------------------   At [facility] we value your feedback. You may receive a survey about your visit today. Please share your experience as we strive to create trusting relationships with our patients to provide genuine, compassionate, quality care.  We appreciate your understanding and patience as we review any laboratory studies, imaging, and other diagnostic tests that are ordered as we care for you. Our office policy is 5 business days for review of these results, and any emergent  or urgent results are addressed in a timely manner for your best interest. If you do not hear from our office in 1 week, please contact us.   We also encourage the use of MyChart, which contains your medical information for your review as well. If you are not enrolled in this feature, an access code is on this after visit summary for your convenience. Thank you for allowing [place] to be involved in your care.  It was great to see you today!  I hope you have a great Fall!!

## 2019-11-01 NOTE — Progress Notes (Signed)
Referring Provider: [name], MD Primary Care Physician:  Tracey Helper, MD Primary GI:  Dr. Gala Romney  Chief Complaint  Patient presents with  . Gastroesophageal Reflux    doing fine    HPI:   Tracey Daniel is a 39 y.o. female who presents for follow-up on GERD.  Patient was last seen in our office 05/02/2019 for GERD, gastroparesis, gastric outlet obstruction, constipation.  Noted history of GERD, gastric outlet obstruction, nausea with vomiting.  Previous pyloric spasm/stenosis, cannabinoid hyperemesis syndrome.  Previous Botox injections in the pyloric valve.  Previous CT imaging in May 2020 unremarkable and query role possible surgical consult for a normal-appearing pyloric valve.  Most recent EGD 06/01/2018 with esophagus status post biopsy, small hiatal hernia, erythematous mucosa in the cardia, gastric fundus, and gastric body.  Erosive gastropathy status post biopsy, normal pylorus which was dilated and injected with Botox.  Duodenal mucosa status post biopsy.  Surgical pathology, duodenum, stomach, esophageal biopsies all essentially benign mucosa.  Updated gastric emptying study was normal.  In 2012 she had a delayed emptying with recommendations for manometry, possible treatment options including fundoplication thyroplasty.  At her last visit noted if she takes her medications her symptoms are well managed although insurance not wanting to cover Zofran.  She also ran out of Reglan and a refill was sent to the wrong provider.  Denies any adverse effects to Reglan.  If she is on her medication she can keep down her food and fluids with rare abdominal discomfort associated with bloating that resolves with time.  Zofran helps her nausea and vomiting.  Constipation does well on Linzess with rare breakthrough that is diet dependent, on 145 mcg dosing.  Still on Ensure once daily.  GERD does well on Protonix.  No other overt GI complaints.  Recommended refill of Reglan 3  times a day, Linzess 145 mcg daily with a refill sent, follow-up in 6 months.  Today she states she doing okay overall. GERD doing well on PPI, minimal to no breakthrough. Insurance isn't covering Zofran, so she's back on Reglan which works well for her. No recent N/V. No side effects from the Reglan. Constipation is doing well on Linzess, no loose stools. Denies abdominal pain, hematochezia, melena, fever, chills, unintentional weight loss. Denies URI or flu-like symptoms. Denies loss of sense of taste or smell. The patient has received COVID-19 vaccination(s). Denies chest pain, dyspnea, dizziness, lightheadedness, syncope, near syncope. Denies any other upper or lower GI symptoms.  Still taking Ensure once a day. Appetite is good. She needs a refill of Protonix.   Past Medical History:  Diagnosis Date  . Anemia of other chronic disease 11/29/2012  . Asthma   . Back pain, chronic   . Chronic abdominal pain   . Constipation   . COPD (chronic obstructive pulmonary disease) ([place])   . Cyst of skin    mid spine area  . Depression 2000   h/o suicidal ideation  . Gastric outlet obstruction   . Gastritis   . Gastroparesis   . Migraine   . Migraines   . Nausea and vomiting    recurrent  . Nicotine addiction   . Osteoporosis   . Peptic ulcer disease   . Pyloric spasm 03/30/2011  . Seasonal allergies   . Sinusitis   . Substance abuse ([place]) 2008   marijuana  . Vitamin D deficiency     Past Surgical History:  Procedure Laterality Date  . BALLOON DILATION N/A  02/09/2013   Procedure: BALLOON DILATION;  Surgeon: Missy Sabins, MD;  Location: [facility];  Service: Endoscopy;  Laterality: N/A;  . BALLOON DILATION N/A 03/10/2018   Procedure: BALLOON DILATION;  Surgeon: Daneil Dolin, MD;  Location: [facility];  Service: Endoscopy;  Laterality: N/A;  . BILIARY DILATION  06/01/2018   Procedure: PYLORIC DILATION;  Surgeon: Rush Landmark Telford Nab., MD;  Location: [place];  Service:  Gastroenterology;;  . BIOPSY  06/01/2018   Procedure: BIOPSY;  Surgeon: Irving Copas., MD;  Location: [facility];  Service: Gastroenterology;;  . Lum Keas INJECTION N/A 02/09/2013   Procedure: BOTOX INJECTION;  Surgeon: Missy Sabins, MD;  Location: [facility];  Service: Endoscopy;  Laterality: N/A;  possible balloon  . BOTOX INJECTION N/A 10/24/2015   Procedure: BOTOX INJECTION;  Surgeon: Daneil Dolin, MD;  Location: [facility];  Service: Endoscopy;  Laterality: N/A;  . BOTOX INJECTION N/A 03/10/2018   Procedure: BOTOX INJECTION;  Surgeon: Daneil Dolin, MD;  Location: [facility];  Service: Endoscopy;  Laterality: N/A;  Melanie notified  . BOTOX INJECTION  06/01/2018   Procedure: BOTOX INJECTION;  Surgeon: Rush Landmark Telford Nab., MD;  Location: [place];  Service: Gastroenterology;;  . CARPAL TUNNEL RELEASE     left hand  . COLONOSCOPY WITH PROPOFOL N/A 09/20/2014   RMR: Normal ileo-colonoscopy  . ESOPHAGEAL DILATION  12/25/2010   Procedure: ESOPHAGEAL DILATION;  Surgeon: Dorothyann Peng, MD;  Location: [facility];  Service: Endoscopy;;  . ESOPHAGEAL MANOMETRY N/A 10/03/2018   Procedure: ESOPHAGEAL MANOMETRY (EM);  Surgeon: Mauri Pole, MD;  Location: [facility];  Service: Endoscopy;  Laterality: N/A;  . ESOPHAGOGASTRODUODENOSCOPY  12/25/2010   Dorothyann Peng, MD;  moderate gastritis, ?goo secondary to pylorspasm. BX showed reactive gstropathy no h.pyori, SB mucosa with intramucosal lymphocytosis and partial villous blunting (TTG 4.0 normal)  . ESOPHAGOGASTRODUODENOSCOPY N/A 11/14/2012   AUQ:[id number] hiatal hernia. Abnormal gastric mucosa of  uncertain significance-status post biopsy. Subjectively, patient may have recurrent symptomatic, pylorospasm.  . ESOPHAGOGASTRODUODENOSCOPY (EGD) WITH PROPOFOL N/A 02/09/2013   elongated stomach, partial lower esophageal ring widely patent. No obvious pyloric stenosis s/p Botox  . ESOPHAGOGASTRODUODENOSCOPY (EGD) WITH PROPOFOL  N/A 10/24/2015   Procedure: ESOPHAGOGASTRODUODENOSCOPY (EGD) WITH PROPOFOL;  Surgeon: Daneil Dolin, MD;  Location: [facility];  Service: Endoscopy;  Laterality: N/A;  830   . ESOPHAGOGASTRODUODENOSCOPY (EGD) WITH PROPOFOL N/A 03/10/2018   Dr. Gala Romney: normal esophagus, elongated, dilated stomach likely stigmata of slow gastric emptying due to narrowing of pyloric channel, s/p balloon dilatation of pyloric channel. Small hiatal hernia, normal duodenal bulb and second portion of duodenum  . ESOPHAGOGASTRODUODENOSCOPY (EGD) WITH PROPOFOL N/A 06/01/2018   Procedure: ESOPHAGOGASTRODUODENOSCOPY (EGD) WITH PROPOFOL;  Surgeon: Rush Landmark Telford Nab., MD;  Location: [place];  Service: Gastroenterology;  Laterality: N/A;  . LAPAROSCOPIC CHOLECYSTECTOMY  2002   [place]?  [place]?  . laser surgery on cervix    . NASAL SEPTUM SURGERY  01/29/2017  . TOE SURGERY      Current Outpatient Medications  Medication Sig Dispense Refill  . acetaminophen (TYLENOL) 325 MG tablet Take 2 tablets (650 mg total) by mouth every 6 (six) hours as needed for mild pain (or Fever >/= 101). 20 tablet 0  . azelastine (ASTELIN) 0.1 % nasal spray USE 2 SPRAYS IN EACH NOSTRIL TWICE DAILY AS DIRECTED 30 mL 2  . budesonide-formoterol (SYMBICORT) 80-4.5 MCG/ACT inhaler Inhale 2 puffs into the lungs 2 (two) times daily. 1 Inhaler  3  . clotrimazole-betamethasone (LOTRISONE) cream Apply sparingly to rash on face once daily for 1 week 15 g 0  . diclofenac (VOLTAREN) 75 MG EC tablet TAKE 1 TABLET(75 MG) BY MOUTH TWICE DAILY 180 tablet 1  . estradiol (ESTRACE) 0.1 MG/GM vaginal cream INSERT 3.15 APPLICATORFUL VAGINALLY THREE TIMES WEEKLY AS DIRECTED 42.5 g 4  . estradiol (ESTRACE) 1 MG tablet TAKE 1 TABLET(1 MG) BY MOUTH DAILY 90 tablet 3  . feeding supplement (ENSURE CLINICAL STRENGTH) LIQD Take 237 mLs by mouth 3 (three) times daily with meals. (Patient taking differently: Take 237 mLs by mouth daily. ) 10 Bottle 3  .  fluticasone (FLONASE) 50 MCG/ACT nasal spray SHAKE LIQUID AND USE 2 SPRAYS IN EACH NOSTRIL DAILY 48 g 2  . gabapentin (NEURONTIN) 100 MG capsule Take 1 capsule (100 mg total) by mouth 3 (three) times daily. 90 capsule 5  . hydrocortisone 2.5 % ointment     . hydroxychloroquine (PLAQUENIL) 200 MG tablet Take 200 mg by mouth daily.    Marland Kitchen ketorolac (TORADOL) 10 MG tablet Take 1 tablet (10 mg total) by mouth every 8 (eight) hours as needed. 18 tablet 0  . linaclotide (LINZESS) 145 MCG CAPS capsule Take 1 capsule (145 mcg total) by mouth daily before breakfast. 30 capsule 5  . megestrol (MEGACE) 20 MG tablet TAKE 1 TABLET(20 MG) BY MOUTH DAILY 30 tablet 2  . metoCLOPramide (REGLAN) 10 MG tablet TAKE 1 TABLET(10 MG) BY MOUTH EVERY 8 HOURS AS NEEDED FOR NAUSEA 90 tablet 2  . nicotine (NICODERM CQ - DOSED IN MG/24 HR) 7 mg/24hr patch Place 1 patch (7 mg total) onto the skin daily. 28 patch 1  . pantoprazole (PROTONIX) 40 MG tablet Take 1 tablet (40 mg total) by mouth 2 (two) times daily before a meal. 180 tablet 3  . potassium chloride SA (KLOR-CON) 20 MEQ tablet TAKE 1 TABLET BY MOUTH EVERY DAY 90 tablet 3  . pravastatin (PRAVACHOL) 20 MG tablet TAKE 1 TABLET(20 MG) BY MOUTH DAILY 90 tablet 1  . progesterone (PROMETRIUM) 200 MG capsule TAKE 1 CAPSULE BY MOUTH EVERY DAY FOR 2 WEEKS EVERY THIRD MONTH: JANUARY, APRIL, JULY AND OCTOBER 14 capsule 3  . propranolol (INDERAL) 10 MG tablet Take 1 tablet (10 mg total) by mouth 3 (three) times daily. 90 tablet 2  . risperiDONE (RISPERDAL) 0.5 MG tablet Take 0.5 mg at bedtime by mouth.    . sertraline (ZOLOFT) 50 MG tablet Take 50 mg by mouth at bedtime.     . sucralfate (CARAFATE) 1 g tablet One tablet every morning and at bedtime for stomach burning. 180 tablet 3  . tiZANidine (ZANAFLEX) 4 MG tablet TAKE 1 TABLET(4 MG) BY MOUTH BACK TWICE DAILY AS NEEDED FOR SPASMS 180 tablet 1  . VENTOLIN HFA 108 (90 Base) MCG/ACT inhaler INHALE 2 PUFFS INTO THE LUNGS EVERY 6  HOURS AS NEEDED FOR SHORTNESS OF BREATH 18 g 0  . zonisamide (ZONEGRAN) 50 MG capsule Take 150 mg by mouth at bedtime.   2   No current facility-administered medications for this visit.    Allergies as of 11/01/2019 - Review Complete 11/01/2019  Allergen Reaction Noted  . Benadryl [diphenhydramine] Anaphylaxis 03/25/2014  . Penicillins Other (See Comments)   . Latex Itching and Other (See Comments) 11/30/2011    Family History  Problem Relation Age of Onset  . Diabetes Father   . Liver disease Father        liver transplant at [facility], age 45  .  Lung cancer Mother 40  . Heart disease Maternal Grandmother   . Parkinson's disease Maternal Grandfather   . Multiple sclerosis Sister 49  . Depression Sister 62  . Alcohol abuse Sister   . Bipolar disorder Sister   . Schizophrenia Sister   . Colon cancer Neg Hx     Social History   Socioeconomic History  . Marital status: Significant Other    Spouse name: Not on file  . Number of children: 0  . Years of education: 9  . Highest education level: 9th grade  Occupational History  . Occupation: disability    Employer: UNEMPLOYED  Tobacco Use  . Smoking status: Current Every Day Smoker    Packs/day: 0.10    Years: 15.00    Pack years: 1.50    Types: Cigarettes  . Smokeless tobacco: Never Used  . Tobacco comment: 2 per day  Vaping Use  . Vaping Use: Never used  Substance and Sexual Activity  . Alcohol use: No    Alcohol/week: 0.0 standard drinks  . Drug use: Yes    Frequency: 2.0 times per week    Types: Marijuana    Comment: trying to quit  . Sexual activity: Yes    Birth control/protection: None  Other Topics Concern  . Not on file  Social History Narrative  . Not on file   Social Determinants of Health   Financial Resource Strain:   . Difficulty of Paying Living Expenses: Not on file  Food Insecurity:   . Worried About Charity fundraiser in the Last Year: Not on file  . Ran Out of Food in the Last Year: Not on  file  Transportation Needs:   . Lack of Transportation (Medical): Not on file  . Lack of Transportation (Non-Medical): Not on file  Physical Activity:   . Days of Exercise per Week: Not on file  . Minutes of Exercise per Session: Not on file  Stress:   . Feeling of Stress : Not on file  Social Connections:   . Frequency of Communication with Friends and Family: Not on file  . Frequency of Social Gatherings with Friends and Family: Not on file  . Attends Religious Services: Not on file  . Active Member of Clubs or Organizations: Not on file  . Attends Archivist Meetings: Not on file  . Marital Status: Not on file    Subjective: Review of Systems  Constitutional: Negative for chills, fever, malaise/fatigue and weight loss.  HENT: Negative for congestion and sore throat.   Respiratory: Negative for cough and shortness of breath.   Cardiovascular: Negative for chest pain and palpitations.  Gastrointestinal: Negative for abdominal pain, blood in stool, constipation, diarrhea, heartburn, melena, nausea and vomiting.  Musculoskeletal: Negative for joint pain and myalgias.  Skin: Negative for rash.  Neurological: Negative for dizziness and weakness.  Endo/Heme/Allergies: Does not bruise/bleed easily.  Psychiatric/Behavioral: Negative for depression. The patient is not nervous/anxious.   All other systems reviewed and are negative.    Objective: BP 118/70   Pulse 72   Temp (!) 97.3 F (36.3 C) (Oral)   Ht 5\' 6"  (1.676 m)   Wt 156 lb (70.8 kg)   LMP  (LMP Unknown) Comment: pt reports premature menopause  BMI 25.18 kg/m  Physical Exam Vitals and nursing note reviewed.  Constitutional:      General: She is not in acute distress.    Appearance: Normal appearance. She is well-developed and normal weight. She is not  ill-appearing, toxic-appearing or diaphoretic.  HENT:     Head: Normocephalic and atraumatic.     Nose: No congestion or rhinorrhea.  Eyes:     General:  No scleral icterus. Cardiovascular:     Rate and Rhythm: Normal rate and regular rhythm.     Heart sounds: Normal heart sounds.  Pulmonary:     Effort: Pulmonary effort is normal. No respiratory distress.     Breath sounds: Normal breath sounds.  Abdominal:     General: Bowel sounds are normal.     Palpations: Abdomen is soft. There is no hepatomegaly, splenomegaly or mass.     Tenderness: There is no abdominal tenderness. There is no guarding or rebound.     Hernia: No hernia is present.  Skin:    General: Skin is warm and dry.     Coloration: Skin is not jaundiced.     Findings: No rash.  Neurological:     General: No focal deficit present.     Mental Status: She is alert and oriented to person, place, and time.  Psychiatric:        Attention and Perception: Attention normal.        Mood and Affect: Mood normal.        Speech: Speech normal.        Behavior: Behavior normal.        Thought Content: Thought content normal.        Cognition and Memory: Cognition and memory normal.      Assessment:  Very pleasant 39 year old female with a complicated GI history as outlined in HPI consisting essentially of gastroparesis/gastric outlet obstruction status post multiple EGDs with dilation and Botox injections, GERD, intractable nausea and vomiting, constipation.  Overall her constellation of symptoms is very well controlled today.  I feel we have gotten into a good point with the appropriate medications that is keeping her disease is well managed.  She states she is doing great and is happy with where she is at now.  GERD: Doing well on Protonix twice daily.  No breakthrough symptoms.  Continue PPI call for problems  Constipation: Having a great effect from Linzess without adverse effects such as loose stools.  I recommended she continue her 145 mcg dosing and let us know for any breakthrough symptoms or loose stools  Gastroparesis, gastric outlet obstruction, nausea/vomiting: Overall  her symptoms are doing well.  She has not required an EGD in some time.  Insurance would not cover Zofran.  However, she is doing well on Reglan as per above.  She denies any adverse effects.  She will let us know if she does develop any side effects.  Recommend she continue Reglan as well as Protonix which is likely helping as well.  Notify us of any worsening symptoms at which point she may require repeat EGD.   Plan: 1. Protonix was refilled today 2. Continue other current medications 3. Let us know for any worsening 4. Follow-up in 6 months    Thank you for allowing [place] to participate in the care of Nambe, DNP, AGNP-C Adult & Gerontological Nurse Practitioner [facility]   11/01/2019 8:35 AM   Disclaimer: This note was dictated with voice recognition software. Similar sounding words can inadvertently be transcribed and may not be corrected upon review.

## 2019-11-01 NOTE — Progress Notes (Signed)
Cc'ed to pcp °

## 2019-11-06 ENCOUNTER — Other Ambulatory Visit: Payer: Self-pay | Admitting: Family Medicine

## 2019-11-13 ENCOUNTER — Other Ambulatory Visit: Payer: Self-pay

## 2019-11-13 ENCOUNTER — Ambulatory Visit (INDEPENDENT_AMBULATORY_CARE_PROVIDER_SITE_OTHER): Payer: Medicare HMO

## 2019-11-13 VITALS — BP 118/70 | HR 72 | Temp 97.3°F | Ht 66.0 in | Wt 156.0 lb

## 2019-11-13 DIAGNOSIS — Z0000 Encounter for general adult medical examination without abnormal findings: Secondary | ICD-10-CM | POA: Diagnosis not present

## 2019-11-13 NOTE — Progress Notes (Addendum)
Subjective:   Tracey Daniel is a 39 y.o. female who presents for Medicare Annual (Subsequent) preventive examination.       Objective:    There were no vitals filed for this visit. There is no height or weight on file to calculate BMI.  Advanced Directives 05/09/2019 04/26/2019 11/11/2018 06/01/2018 05/31/2018 05/31/2018 05/29/2018  Does Patient Have a Medical Advance Directive? No No No No No No No  Would patient like information on creating a medical advance directive? Yes (MAU/Ambulatory/Procedural Areas - Information given) No - Patient declined - - No - Patient declined - No - Patient declined  Pre-existing out of facility DNR order (yellow form or pink MOST form) - - - - - - -    Current Medications (verified) Outpatient Encounter Medications as of 11/13/2019  Medication Sig  . acetaminophen (TYLENOL) 325 MG tablet Take 2 tablets (650 mg total) by mouth every 6 (six) hours as needed for mild pain (or Fever >/= 101).  Marland Kitchen azelastine (ASTELIN) 0.1 % nasal spray USE 2 SPRAYS IN EACH NOSTRIL TWICE DAILY AS DIRECTED  . budesonide-formoterol (SYMBICORT) 80-4.5 MCG/ACT inhaler Inhale 2 puffs into the lungs 2 (two) times daily.  . clotrimazole-betamethasone (LOTRISONE) cream Apply sparingly to rash on face once daily for 1 week  . diclofenac (VOLTAREN) 75 MG EC tablet TAKE 1 TABLET(75 MG) BY MOUTH TWICE DAILY  . estradiol (ESTRACE) 0.1 MG/GM vaginal cream INSERT 0.62 APPLICATORFUL VAGINALLY THREE TIMES WEEKLY AS DIRECTED  . estradiol (ESTRACE) 1 MG tablet TAKE 1 TABLET(1 MG) BY MOUTH DAILY  . feeding supplement (ENSURE CLINICAL STRENGTH) LIQD Take 237 mLs by mouth 3 (three) times daily with meals. (Patient taking differently: Take 237 mLs by mouth daily. )  . fluticasone (FLONASE) 50 MCG/ACT nasal spray SHAKE LIQUID AND USE 2 SPRAYS IN EACH NOSTRIL DAILY  . gabapentin (NEURONTIN) 100 MG capsule Take 1 capsule (100 mg total) by mouth 3 (three) times daily.  . hydrocortisone 2.5 % ointment    . hydroxychloroquine (PLAQUENIL) 200 MG tablet Take 200 mg by mouth daily.  Marland Kitchen ketorolac (TORADOL) 10 MG tablet Take 1 tablet (10 mg total) by mouth every 8 (eight) hours as needed.  . linaclotide (LINZESS) 145 MCG CAPS capsule Take 1 capsule (145 mcg total) by mouth daily before breakfast.  . megestrol (MEGACE) 20 MG tablet TAKE 1 TABLET(20 MG) BY MOUTH DAILY  . metoCLOPramide (REGLAN) 10 MG tablet TAKE 1 TABLET(10 MG) BY MOUTH EVERY 8 HOURS AS NEEDED FOR NAUSEA  . nicotine (NICODERM CQ - DOSED IN MG/24 HR) 7 mg/24hr patch Place 1 patch (7 mg total) onto the skin daily.  . pantoprazole (PROTONIX) 40 MG tablet Take 1 tablet (40 mg total) by mouth 2 (two) times daily before a meal.  . potassium chloride SA (KLOR-CON) 20 MEQ tablet TAKE 1 TABLET BY MOUTH EVERY DAY  . pravastatin (PRAVACHOL) 20 MG tablet TAKE 1 TABLET(20 MG) BY MOUTH DAILY  . progesterone (PROMETRIUM) 200 MG capsule TAKE 1 CAPSULE BY MOUTH EVERY DAY FOR 2 WEEKS EVERY THIRD MONTH: JANUARY, APRIL, JULY AND OCTOBER  . propranolol (INDERAL) 10 MG tablet Take 1 tablet (10 mg total) by mouth 3 (three) times daily.  . risperiDONE (RISPERDAL) 0.5 MG tablet Take 0.5 mg at bedtime by mouth.  . sertraline (ZOLOFT) 50 MG tablet Take 50 mg by mouth at bedtime.   . sucralfate (CARAFATE) 1 g tablet One tablet every morning and at bedtime for stomach burning.  Marland Kitchen tiZANidine (ZANAFLEX) 4 MG  tablet TAKE 1 TABLET(4 MG) BY MOUTH BACK TWICE DAILY AS NEEDED FOR SPASMS  . VENTOLIN HFA 108 (90 Base) MCG/ACT inhaler INHALE 2 PUFFS INTO THE LUNGS EVERY 6 HOURS AS NEEDED FOR SHORTNESS OF BREATH  . zonisamide (ZONEGRAN) 50 MG capsule Take 150 mg by mouth at bedtime.    No facility-administered encounter medications on file as of 11/13/2019.    Allergies (verified) Benadryl [diphenhydramine], Penicillins, and Latex   History: Past Medical History:  Diagnosis Date  . Anemia of other chronic disease 11/29/2012  . Asthma   . Back pain, chronic   .  Chronic abdominal pain   . Constipation   . COPD (chronic obstructive pulmonary disease) ([place])   . Cyst of skin    mid spine area  . Depression 2000   h/o suicidal ideation  . Gastric outlet obstruction   . Gastritis   . Gastroparesis   . Migraine   . Migraines   . Nausea and vomiting    recurrent  . Nicotine addiction   . Osteoporosis   . Peptic ulcer disease   . Pyloric spasm 03/30/2011  . Seasonal allergies   . Sinusitis   . Substance abuse ([place]) 2008   marijuana  . Vitamin D deficiency    Past Surgical History:  Procedure Laterality Date  . BALLOON DILATION N/A 02/09/2013   Procedure: BALLOON DILATION;  Surgeon: Missy Sabins, MD;  Location: [facility];  Service: Endoscopy;  Laterality: N/A;  . BALLOON DILATION N/A 03/10/2018   Procedure: BALLOON DILATION;  Surgeon: Daneil Dolin, MD;  [facility];  Service: Endoscopy;  Laterality: N/A;  . BILIARY DILATION  06/01/2018   Procedure: PYLORIC DILATION;  Surgeon: Rush Landmark Telford Nab., MD;  Location: [place];  Service: Gastroenterology;;  . BIOPSY  06/01/2018   Procedure: BIOPSY;  Surgeon: Irving Copas., MD;  Location[facility];  Service: Gastroenterology;;  . Lum Keas INJECTION N/A 02/09/2013   Procedure: BOTOX INJECTION;  Surgeon: Missy Sabins, MD;  Location: [facility];  Service: Endoscopy;  Laterality: N/A;  possible balloon  . BOTOX INJECTION N/A 10/24/2015   Procedure: BOTOX INJECTION;  Surgeon: Daneil Dolin, MD;  [facility];  Service: Endoscopy;  Laterality: N/A;  . BOTOX INJECTION N/A 03/10/2018   Procedure: BOTOX INJECTION;  Surgeon: Daneil Dolin, MD;  [facility];  Service: Endoscopy;  Laterality: N/A;  Melanie notified  . BOTOX INJECTION  06/01/2018   Procedure: BOTOX INJECTION;  Surgeon: Rush Landmark Telford Nab., MD;  Location: [place];  Service: Gastroenterology;;  . CARPAL TUNNEL RELEASE     left hand  . COLONOSCOPY WITH PROPOFOL N/A 09/20/2014   RMR:  Normal ileo-colonoscopy  . ESOPHAGEAL DILATION  12/25/2010   Procedure: ESOPHAGEAL DILATION;  Surgeon: Dorothyann Peng, MD;  [facility];  Service: Endoscopy;;  . ESOPHAGEAL MANOMETRY N/A 10/03/2018   Procedure: ESOPHAGEAL MANOMETRY (EM);  Surgeon: Mauri Pole, MD;  Location: [facility];  Service: Endoscopy;  Laterality: N/A;  . ESOPHAGOGASTRODUODENOSCOPY  12/25/2010   Dorothyann Peng, MD;  moderate gastritis, ?goo secondary to pylorspasm. BX showed reactive gstropathy no h.pyori, SB mucosa with intramucosal lymphocytosis and partial villous blunting (TTG 4.0 normal)  . ESOPHAGOGASTRODUODENOSCOPY N/A 11/14/2012   KDT:OIZTI hiatal hernia. Abnormal gastric mucosa of  uncertain significance-status post biopsy. Subjectively, patient may have recurrent symptomatic, pylorospasm.  . ESOPHAGOGASTRODUODENOSCOPY (EGD) WITH PROPOFOL N/A 02/09/2013   elongated stomach, partial lower esophageal ring widely patent. No obvious pyloric stenosis  s/p Botox  . ESOPHAGOGASTRODUODENOSCOPY (EGD) WITH PROPOFOL N/A 10/24/2015   Procedure: ESOPHAGOGASTRODUODENOSCOPY (EGD) WITH PROPOFOL;  Surgeon: Daneil Dolin, MD;  [facility];  Service: Endoscopy;  Laterality: N/A;  830   . ESOPHAGOGASTRODUODENOSCOPY (EGD) WITH PROPOFOL N/A 03/10/2018   Dr. Gala Romney: normal esophagus, elongated, dilated stomach likely stigmata of slow gastric emptying due to narrowing of pyloric channel, s/p balloon dilatation of pyloric channel. Small hiatal hernia, normal duodenal bulb and second portion of duodenum  . ESOPHAGOGASTRODUODENOSCOPY (EGD) WITH PROPOFOL N/A 06/01/2018   Procedure: ESOPHAGOGASTRODUODENOSCOPY (EGD) WITH PROPOFOL;  Surgeon: Rush Landmark Telford Nab., MD;  Location: [facility];  Service: Gastroenterology;  Laterality: N/A;  . LAPAROSCOPIC CHOLECYSTECTOMY  2002   [place] Na?  [place]?  . laser surgery on cervix    . NASAL SEPTUM SURGERY  01/29/2017  . TOE SURGERY     Family History  Problem  Relation Age of Onset  . Diabetes Father   . Liver disease Father        liver transplant at [facility], age 54  . Lung cancer Mother 29  . Heart disease Maternal Grandmother   . Parkinson's disease Maternal Grandfather   . Multiple sclerosis Sister 51  . Depression Sister 47  . Alcohol abuse Sister   . Bipolar disorder Sister   . Schizophrenia Sister   . Colon cancer Neg Hx    Social History   Socioeconomic History  . Marital status: Significant Other    Spouse name: Not on file  . Number of children: 0  . Years of education: 9  . Highest education level: 9th grade  Occupational History  . Occupation: disability    Employer: UNEMPLOYED  Tobacco Use  . Smoking status: Current Every Day Smoker    Packs/day: 0.10    Years: 15.00    Pack years: 1.50    Types: Cigarettes  . Smokeless tobacco: Never Used  . Tobacco comment: 2 per day  Vaping Use  . Vaping Use: Never used  Substance and Sexual Activity  . Alcohol use: No    Alcohol/week: 0.0 standard drinks  . Drug use: Yes    Frequency: 2.0 times per week    Types: Marijuana    Comment: trying to quit  . Sexual activity: Yes    Birth control/protection: None  Other Topics Concern  . Not on file  Social History Narrative  . Not on file   Social Determinants of Health   Financial Resource Strain:   . Difficulty of Paying Living Expenses: Not on file  Food Insecurity:   . Worried About Charity fundraiser in the Last Year: Not on file  . Ran Out of Food in the Last Year: Not on file  Transportation Needs:   . Lack of Transportation (Medical): Not on file  . Lack of Transportation (Non-Medical): Not on file  Physical Activity:   . Days of Exercise per Week: Not on file  . Minutes of Exercise per Session: Not on file  Stress:   . Feeling of Stress : Not on file  Social Connections:   . Frequency of Communication with Friends and Family: Not on file  . Frequency of Social Gatherings with Friends and Family: Not on file   . Attends Religious Services: Not on file  . Active Member of Clubs or Organizations: Not on file  . Attends Archivist Meetings: Not on file  . Marital Status: Not on file    Tobacco Counseling Ready to quit:  Not Answered Counseling given: Not Answered Comment: 2 per day                  Diabetic? No          Activities of Daily Living No flowsheet data found.  Patient Care Team: [name], MD as PCP - General Rourk, Cristopher Estimable, MD (Gastroenterology) Whitney Muse Kelby Fam, MD (Inactive) as Consulting Physician (Hematology and Oncology) Scherrie November, MD as Referring Physician (Gastroenterology) Mansouraty, Telford Nab., MD as Consulting Physician (Gastroenterology) Michael Boston, MD as Consulting Physician (General Surgery)  Indicate any recent Medical Services you may have received from other than [facility] providers in the past year (date may be approximate).     Assessment:   This is a routine wellness examination for Quin.  Hearing/Vision screen No exam data present  Dietary issues and exercise activities discussed:    Goals    . DIET - INCREASE LEAN PROTEINS    . Exercise 3x per week (30 min per time)     Recommend increasing your exercise program at least 3 days a week for 30-45 minutes at a time as tolerated.      . Quit Smoking      Depression Screen PHQ 2/9 Scores 10/05/2019 08/08/2019 05/02/2019 03/07/2019 02/07/2019 11/11/2018 09/08/2018  PHQ - 2 Score 0 0 0 0 0 0 5  PHQ- 9 Score - 1 - - 0 - 11    Fall Risk Fall Risk  10/05/2019 08/08/2019 05/02/2019 03/07/2019 02/07/2019  Falls in the past year? 0 1 0 0 0  Number falls in past yr: - 1 0 0 0  Injury with Fall? - 0 0 0 0  Risk for fall due to : - - - - -  Follow up - - Falls evaluation completed - -    Any stairs in or around the home? No  If so, are there any without handrails? No  Home free of loose throw rugs in walkways, pet beds, electrical cords, etc? Yes  Adequate  lighting in your home to reduce risk of falls? Yes   ASSISTIVE DEVICES UTILIZED TO PREVENT FALLS:  Life alert? No  Use of a cane, walker or w/c? No  Grab bars in the bathroom? No  Shower chair or bench in shower? No  Elevated toilet seat or a handicapped toilet? No   TIMED UP AND GO:  Was the test performed? No .    Cognitive Function:     6CIT Screen 11/11/2018 11/08/2017 06/04/2016  What Year? 0 points 0 points 0 points  What month? 0 points 0 points 0 points  What time? 0 points 0 points 0 points  Count back from 20 0 points 0 points 0 points  Months in reverse 4 points 0 points 0 points  Repeat phrase 2 points 2 points 0 points  Total Score 6 2 0    Immunizations Immunization History  Administered Date(s) Administered  . Influenza Split 10/30/2010, 10/09/2011  . Influenza,inj,Quad PF,6+ Mos 11/14/2012, 12/13/2013, 09/18/2014, 10/02/2015, 11/23/2016, 11/08/2017, 09/08/2018, 10/05/2019  . Moderna SARS-COVID-2 Vaccination 05/23/2019, 06/23/2019  . Pneumococcal Polysaccharide-23 08/21/2010  . Td 06/20/2001  . Tdap 08/21/2010    TDAP status: Up to date Flu Vaccine status: Up to date Pneumococcal vaccine status: Up to date Covid-19 vaccine status: Completed vaccines  Qualifies for Shingles Vaccine? No   Zostavax completed No   Shingrix Completed?: No.    Education has been provided regarding the importance of this vaccine. Patient has been  advised to call insurance company to determine out of pocket expense if they have not yet received this vaccine. Advised may also receive vaccine at local pharmacy or Health Dept. Verbalized acceptance and understanding.  Screening Tests Health Maintenance  Topic Date Due  . TETANUS/TDAP  08/20/2020  . PAP SMEAR-Modifier  03/06/2022  . INFLUENZA VACCINE  Completed  . COVID-19 Vaccine  Completed  . Hepatitis C Screening  Completed  . HIV Screening  Completed    Health Maintenance  There are no preventive care reminders to  display for this patient.  Colorectal cancer screening: Completed normal. Repeat every 5 years Mammogram status: Completed normal. Repeat every year   Lung Cancer Screening: (Low Dose CT Chest recommended if Age 46-80 years, 30 pack-year currently smoking OR have quit w/in 15years.) does qualify.    Additional Screening:  Hepatitis C Screening: does qualify; Completed   Vision Screening: Recommended annual ophthalmology exams for early detection of glaucoma and other disorders of the eye. Is the patient up to date with their annual eye exam?  Yes  Who is the provider or what is the name of the office in which the patient attends annual eye exams? [facility] Linna Hoff  If pt is not established with a provider, would they like to be referred to a provider to establish care? No .   Dental Screening: Recommended annual dental exams for proper oral hygiene  Community Resource Referral / Chronic Care Management: CRR required this visit?  No   CCM required this visit?  No      Plan:     I have personally reviewed and noted the following in the patient's chart:   . Medical and social history . Use of alcohol, tobacco or illicit drugs  . Current medications and supplements . Functional ability and status . Nutritional status . Physical activity . Advanced directives . List of other physicians . Hospitalizations, surgeries, and ER visits in previous 12 months . Vitals . Screenings to include cognitive, depression, and falls . Referrals and appointments  In addition, I have reviewed and discussed with patient certain preventive protocols, quality metrics, and best practice recommendations. A written personalized care plan for preventive services as well as general preventive health recommendations were provided to patient.     Lonn Georgia, LPN   [id number]   Nurse Notes: AWV conducted over the phone with pt in the home and provider in the office.

## 2019-11-28 ENCOUNTER — Other Ambulatory Visit: Payer: Self-pay | Admitting: *Deleted

## 2019-11-28 MED ORDER — ESTRADIOL 0.1 MG/GM VA CREA: TOPICAL_CREAM | VAGINAL | 4 refills | Status: DC

## 2019-12-01 DIAGNOSIS — F251 Schizoaffective disorder, depressive type: Secondary | ICD-10-CM | POA: Diagnosis not present

## 2019-12-05 ENCOUNTER — Encounter: Payer: Self-pay | Admitting: Family Medicine

## 2019-12-05 ENCOUNTER — Other Ambulatory Visit: Payer: Self-pay

## 2019-12-05 ENCOUNTER — Ambulatory Visit (INDEPENDENT_AMBULATORY_CARE_PROVIDER_SITE_OTHER): Payer: Medicare HMO | Admitting: Family Medicine

## 2019-12-05 VITALS — BP 123/72 | HR 81 | Resp 16 | Ht 66.0 in | Wt 159.1 lb

## 2019-12-05 DIAGNOSIS — F324 Major depressive disorder, single episode, in partial remission: Secondary | ICD-10-CM | POA: Diagnosis not present

## 2019-12-05 DIAGNOSIS — F17218 Nicotine dependence, cigarettes, with other nicotine-induced disorders: Secondary | ICD-10-CM | POA: Diagnosis not present

## 2019-12-05 DIAGNOSIS — E559 Vitamin D deficiency, unspecified: Secondary | ICD-10-CM | POA: Diagnosis not present

## 2019-12-05 DIAGNOSIS — D509 Iron deficiency anemia, unspecified: Secondary | ICD-10-CM | POA: Diagnosis not present

## 2019-12-05 DIAGNOSIS — D539 Nutritional anemia, unspecified: Secondary | ICD-10-CM | POA: Diagnosis not present

## 2019-12-05 DIAGNOSIS — E782 Mixed hyperlipidemia: Secondary | ICD-10-CM

## 2019-12-05 DIAGNOSIS — Z1231 Encounter for screening mammogram for malignant neoplasm of breast: Secondary | ICD-10-CM

## 2019-12-05 NOTE — Patient Instructions (Addendum)
Annual general medical exam with  MD early March, call if you need me before  Please schedule bilateral screening mammogram past due , was due in July 2021  Labs today, CBC, iron , ferritin. Vit D , CBC and TSH  Covid booster due Dec 5 or after  Quit date for cigarettes is 12/12/2019, call 1800QUIT NOW for free support   Coping with Quitting Smoking  Quitting smoking is a physical and mental challenge. You will face cravings, withdrawal symptoms, and temptation. Before quitting, work with your health care provider to make a plan that can help you cope. Preparation can help you quit and keep you from giving in. How can I cope with cravings? Cravings usually last for 5-10 minutes. If you get through it, the craving will pass. Consider taking the following actions to help you cope with cravings:  Keep your mouth busy: ? Chew sugar-free gum. ? Suck on hard candies or a straw. ? Brush your teeth.  Keep your hands and body busy: ? Immediately change to a different activity when you feel a craving. ? Squeeze or play with a ball. ? Do an activity or a hobby, like making bead jewelry, practicing needlepoint, or working with wood. ? Mix up your normal routine. ? Take a short exercise break. Go for a quick walk or run up and down stairs. ? Spend time in public places where smoking is not allowed.  Focus on doing something kind or helpful for someone else.  Call a friend or family member to talk during a craving.  Join a support group.  Call a quit line, such as 1-800-QUIT-NOW.  Talk with your health care provider about medicines that might help you cope with cravings and make quitting easier for you. How can I deal with withdrawal symptoms? Your body may experience negative effects as it tries to get used to not having nicotine in the system. These effects are called withdrawal symptoms. They may include:  Feeling hungrier than normal.  Trouble  concentrating.  Irritability.  Trouble sleeping.  Feeling depressed.  Restlessness and agitation.  Craving a cigarette. To manage withdrawal symptoms:  Avoid places, people, and activities that trigger your cravings.  Remember why you want to quit.  Get plenty of sleep.  Avoid coffee and other caffeinated drinks. These may worsen some of your symptoms. How can I handle social situations? Social situations can be difficult when you are quitting smoking, especially in the first few weeks. To manage this, you can:  Avoid parties, bars, and other social situations where people might be smoking.  Avoid alcohol.  Leave right away if you have the urge to smoke.  Explain to your family and friends that you are quitting smoking. Ask for understanding and support.  Plan activities with friends or family where smoking is not an option. What are some ways I can cope with stress? Wanting to smoke may cause stress, and stress can make you want to smoke. Find ways to manage your stress. Relaxation techniques can help. For example:  Breathe slowly and deeply, in through your nose and out through your mouth.  Listen to soothing, relaxing music.  Talk with a family member or friend about your stress.  Light a candle.  Soak in a bath or take a shower.  Think about a peaceful place. What are some ways I can prevent weight gain? Be aware that many people gain weight after they quit smoking. However, not everyone does. To keep from gaining weight,  have a plan in place before you quit and stick to the plan after you quit. Your plan should include:  Having healthy snacks. When you have a craving, it may help to: ? Eat plain popcorn, crunchy carrots, celery, or other cut vegetables. ? Chew sugar-free gum.  Changing how you eat: ? Eat small portion sizes at meals. ? Eat 4-6 small meals throughout the day instead of 1-2 large meals a day. ? Be mindful when you eat. Do not watch  television or do other things that might distract you as you eat.  Exercising regularly: ? Make time to exercise each day. If you do not have time for a long workout, do short bouts of exercise for 5-10 minutes several times a day. ? Do some form of strengthening exercise, like weight lifting, and some form of aerobic exercise, like running or swimming.  Drinking plenty of water or other low-calorie or no-calorie drinks. Drink 6-8 glasses of water daily, or as much as instructed by your health care provider. Summary  Quitting smoking is a physical and mental challenge. You will face cravings, withdrawal symptoms, and temptation to smoke again. Preparation can help you as you go through these challenges.  You can cope with cravings by keeping your mouth busy (such as by chewing gum), keeping your body and hands busy, and making calls to family, friends, or a helpline for people who want to quit smoking.  You can cope with withdrawal symptoms by avoiding places where people smoke, avoiding drinks with caffeine, and getting plenty of rest.  Ask your health care provider about the different ways to prevent weight gain, avoid stress, and handle social situations. This information is not intended to replace advice given to you by your health care provider. Make sure you discuss any questions you have with your health care provider. Document Revised: 12/18/2016 Document Reviewed: 01/03/2016 Elsevier Patient Education  2020 [employer].

## 2019-12-06 ENCOUNTER — Other Ambulatory Visit: Payer: Self-pay | Admitting: Family Medicine

## 2019-12-06 LAB — CBC
Hematocrit: 32.5 % — ABNORMAL LOW (ref 34.0–46.6)
Hemoglobin: 11.2 g/dL (ref 11.1–15.9)
MCH: 32.2 pg (ref 26.6–33.0)
MCHC: 34.5 g/dL (ref 31.5–35.7)
MCV: 93 fL (ref 79–97)
Platelets: 188 10*3/uL (ref 150–450)
RBC: 3.48 x10E6/uL — ABNORMAL LOW (ref 3.77–5.28)
RDW: 12.1 % (ref 11.7–15.4)
WBC: 10.2 10*3/uL (ref 3.4–10.8)

## 2019-12-06 LAB — FERRITIN: Ferritin: 239 ng/mL — ABNORMAL HIGH (ref 15–150)

## 2019-12-06 LAB — VITAMIN D 25 HYDROXY (VIT D DEFICIENCY, FRACTURES): Vit D, 25-Hydroxy: 30.1 ng/mL (ref 30.0–100.0)

## 2019-12-06 LAB — IRON: Iron: 60 ug/dL (ref 27–159)

## 2019-12-06 LAB — TSH: TSH: 2.47 u[IU]/mL (ref 0.450–4.500)

## 2019-12-06 MED ORDER — ERGOCALCIFEROL 1.25 MG (50000 UT) PO CAPS: 50000.0000 [IU] | ORAL_CAPSULE | ORAL | 1 refills | Status: DC

## 2019-12-06 NOTE — Progress Notes (Signed)
Vit d 

## 2019-12-08 ENCOUNTER — Encounter: Payer: Self-pay | Admitting: Family Medicine

## 2019-12-08 NOTE — Assessment & Plan Note (Signed)
Managed by Psych and controlled 

## 2019-12-08 NOTE — Assessment & Plan Note (Signed)
Controlled, no change in medication  

## 2019-12-08 NOTE — Assessment & Plan Note (Signed)
Asked:confirms currently smokes cigarettes 1 to 2 / day Assess: willing to set a quit date, and  is cutting back Advise: needs to QUIT to reduce risk of cancer, cardio and cerebrovascular disease Assist: counseled for 5 minutes and literature provided Arrange: follow up in 2 to 4 months

## 2019-12-08 NOTE — Assessment & Plan Note (Addendum)
Hyperlipidemia:Low fat diet discussed and encouraged. Updated lab needed at/ before next visit.    Lipid Panel  Lab Results  Component Value Date   CHOL 151 11/11/2018   HDL 42 (L) 11/11/2018   LDLCALC 90 11/11/2018   TRIG 93 11/11/2018   CHOLHDL 3.6 11/11/2018  Updated lab needed at/ before next visit.

## 2019-12-08 NOTE — Progress Notes (Signed)
   Tracey Daniel     MRN: [id number]      DOB: [birth year]   HPI Tracey Daniel is here for follow up and re-evaluation of chronic medical conditions, medication management and review of any available recent lab and radiology data.  Preventive health is updated, specifically  Cancer screening and Immunization.   Questions or concerns regarding consultations or procedures which the PT has had in the interim are  addressed. The PT denies any adverse reactions to current medications since the last visit.  There are no new concerns.  There are no specific complaints   ROS Denies recent fever or chills. Denies sinus pressure, nasal congestion, ear pain or sore throat. Denies chest congestion, productive cough or wheezing. Denies chest pains, palpitations and leg swelling Denies abdominal pain, nausea, vomiting,diarrhea or constipation.   Denies dysuria, frequency, hesitancy or incontinence. Denies uncontrolled joint pain, swelling and limitation in mobility. Denies headaches, seizures, numbness, or tingling. Denies uncontrolled  depression, anxiety or insomnia. Denies skin break down or rash.   PE  BP 123/72   Pulse 81   Resp 16   Ht 5\' 6"  (1.676 m)   Wt 159 lb 1.9 oz (72.2 kg)   LMP  (LMP Unknown) Comment: pt reports premature menopause  SpO2 100%   BMI 25.68 kg/m   Patient alert and oriented and in no cardiopulmonary distress.  HEENT: No facial asymmetry, EOMI,     Neck supple .  Chest: Clear to auscultation bilaterally.  CVS: S1, S2 no murmurs, no S3.Regular rate.  ABD: Soft non tender.   Ext: No edema  MS: Adequate ROM spine, shoulders, hips and knees.  Skin: Intact, no ulcerations or rash noted.  Psych: Good eye contact, normal affect. Memory intact not anxious or depressed appearing.  CNS: CN 2-12 intact, power,  normal throughout.no focal deficits noted.   Assessment & Plan  Nicotine dependence, cigarettes, with other nicotine-induced  disorders Asked:confirms currently smokes cigarettes 1 to 2 / day Assess: willing to set a quit date, and  is cutting back Advise: needs to QUIT to reduce risk of cancer, cardio and cerebrovascular disease Assist: counseled for 5 minutes and literature provided Arrange: follow up in 2 to 4 months   Hyperlipemia Hyperlipidemia:Low fat diet discussed and encouraged. Updated lab needed at/ before next visit.    Lipid Panel  Lab Results  Component Value Date   CHOL 151 11/11/2018   HDL 42 (L) 11/11/2018   LDLCALC 90 11/11/2018   TRIG 93 11/11/2018   CHOLHDL 3.6 11/11/2018  Updated lab needed at/ before next visit.      COPD (chronic obstructive pulmonary disease) (HCC) Controlled, no change in medication   Depression Managed by Psych and controlled

## 2019-12-11 DIAGNOSIS — G43719 Chronic migraine without aura, intractable, without status migrainosus: Secondary | ICD-10-CM | POA: Diagnosis not present

## 2019-12-11 DIAGNOSIS — M542 Cervicalgia: Secondary | ICD-10-CM | POA: Diagnosis not present

## 2019-12-11 DIAGNOSIS — G518 Other disorders of facial nerve: Secondary | ICD-10-CM | POA: Diagnosis not present

## 2019-12-11 DIAGNOSIS — M791 Myalgia, unspecified site: Secondary | ICD-10-CM | POA: Diagnosis not present

## 2019-12-18 ENCOUNTER — Ambulatory Visit: Payer: Medicare HMO | Admitting: Family Medicine

## 2019-12-20 ENCOUNTER — Other Ambulatory Visit: Payer: Self-pay | Admitting: Dermatology

## 2020-01-03 ENCOUNTER — Other Ambulatory Visit: Payer: Self-pay | Admitting: Family Medicine

## 2020-01-16 ENCOUNTER — Other Ambulatory Visit: Payer: Self-pay | Admitting: Family Medicine

## 2020-01-22 DIAGNOSIS — M542 Cervicalgia: Secondary | ICD-10-CM | POA: Diagnosis not present

## 2020-01-22 DIAGNOSIS — G518 Other disorders of facial nerve: Secondary | ICD-10-CM | POA: Diagnosis not present

## 2020-01-22 DIAGNOSIS — G43719 Chronic migraine without aura, intractable, without status migrainosus: Secondary | ICD-10-CM | POA: Diagnosis not present

## 2020-01-22 DIAGNOSIS — M791 Myalgia, unspecified site: Secondary | ICD-10-CM | POA: Diagnosis not present

## 2020-01-24 ENCOUNTER — Other Ambulatory Visit: Payer: Self-pay | Admitting: Gastroenterology

## 2020-01-24 DIAGNOSIS — K3184 Gastroparesis: Secondary | ICD-10-CM

## 2020-02-06 ENCOUNTER — Other Ambulatory Visit: Payer: Self-pay

## 2020-02-06 ENCOUNTER — Telehealth: Payer: Self-pay

## 2020-02-06 MED ORDER — GABAPENTIN 100 MG PO CAPS
100.0000 mg | ORAL_CAPSULE | Freq: Three times a day (TID) | ORAL | 5 refills | Status: DC
Start: 2020-02-06 — End: 2020-09-24

## 2020-02-06 NOTE — Telephone Encounter (Signed)
Patient needing refill on gabapentin sent to [facility] on [address] ph# [phone]

## 2020-02-06 NOTE — Telephone Encounter (Signed)
Med refilled.

## 2020-02-19 ENCOUNTER — Emergency Department (HOSPITAL_COMMUNITY)
Admission: EM | Admit: 2020-02-19 | Discharge: 2020-02-19 | Disposition: A | Discharge status: Home / Self Care | Payer: Medicare HMO | Attending: Emergency Medicine | Admitting: Emergency Medicine

## 2020-02-19 ENCOUNTER — Other Ambulatory Visit: Payer: Self-pay

## 2020-02-19 DIAGNOSIS — Z91040 Latex allergy status: Secondary | ICD-10-CM | POA: Insufficient documentation

## 2020-02-19 DIAGNOSIS — T161XXA Foreign body in right ear, initial encounter: Secondary | ICD-10-CM | POA: Diagnosis not present

## 2020-02-19 DIAGNOSIS — Z79899 Other long term (current) drug therapy: Secondary | ICD-10-CM | POA: Diagnosis not present

## 2020-02-19 DIAGNOSIS — H6121 Impacted cerumen, right ear: Secondary | ICD-10-CM | POA: Diagnosis not present

## 2020-02-19 DIAGNOSIS — H9201 Otalgia, right ear: Secondary | ICD-10-CM

## 2020-02-19 DIAGNOSIS — Z7951 Long term (current) use of inhaled steroids: Secondary | ICD-10-CM | POA: Insufficient documentation

## 2020-02-19 DIAGNOSIS — J45991 Cough variant asthma: Secondary | ICD-10-CM | POA: Insufficient documentation

## 2020-02-19 DIAGNOSIS — Z85038 Personal history of other malignant neoplasm of large intestine: Secondary | ICD-10-CM | POA: Insufficient documentation

## 2020-02-19 DIAGNOSIS — J449 Chronic obstructive pulmonary disease, unspecified: Secondary | ICD-10-CM | POA: Insufficient documentation

## 2020-02-19 DIAGNOSIS — X58XXXA Exposure to other specified factors, initial encounter: Secondary | ICD-10-CM | POA: Diagnosis not present

## 2020-02-19 DIAGNOSIS — F17210 Nicotine dependence, cigarettes, uncomplicated: Secondary | ICD-10-CM | POA: Diagnosis not present

## 2020-02-19 MED ORDER — CIPROFLOXACIN-DEXAMETHASONE 0.3-0.1 % OT SUSP
4.0000 [drp] | Freq: Once | OTIC | Status: AC
  Administered 2020-02-19: 4 [drp] via OTIC
  Filled 2020-02-19: qty 7.5

## 2020-02-19 NOTE — Discharge Instructions (Addendum)
[facility]  Located in: [facility] Address: [address], [place], [place] [address] Phone: [phone]  Use the eardrops twice daily for the next 5 days follow-up with ears nose throat doctors.

## 2020-02-19 NOTE — ED Notes (Signed)
Ear irrigation completed. Obtained some paper like material. MD notified

## 2020-02-19 NOTE — ED Triage Notes (Signed)
Pt reports earwax in R ear since Saturday. Trying to flush it out and use home remedies without success.

## 2020-02-19 NOTE — ED Notes (Addendum)
Discharge instructions reviewed. Patient confirmed understanding and stated she would call for follow up tomorrow.

## 2020-02-19 NOTE — ED Provider Notes (Signed)
[facility] EMERGENCY DEPARTMENT Provider Note   CSN: [id number] Arrival date & time: 02/19/20  1604     History No chief complaint on file.   Tracey Daniel is a 40 y.o. female.   Otalgia Location:  Right Quality:  Aching Severity:  Moderate Onset quality:  Gradual Duration:  2 days Timing:  Constant Progression:  Worsening Context comment:  Wax impaction Relieved by:  OTC medications Worsened by:  Nothing Ineffective treatments:  None tried Associated symptoms: no congestion, no cough, no diarrhea, no ear discharge, no fever, no headaches, no rash, no rhinorrhea, no tinnitus and no vomiting        Past Medical History:  Diagnosis Date  . Anemia of other chronic disease 11/29/2012  . Asthma   . Back pain, chronic   . Chronic abdominal pain   . Constipation   . COPD (chronic obstructive pulmonary disease) ([place])   . Cyst of skin    mid spine area  . Depression 2000   h/o suicidal ideation  . Gastric outlet obstruction   . Gastritis   . Gastroparesis   . Migraine   . Migraines   . Nausea and vomiting    recurrent  . Nicotine addiction   . Osteoporosis   . Peptic ulcer disease   . Pyloric spasm 03/30/2011  . Seasonal allergies   . Sinusitis   . Substance abuse ([place]) 2008   marijuana  . Vitamin D deficiency     Patient Active Problem List   Diagnosis Date Noted  . Encounter for gynecological examination with Papanicolaou smear of cervix 03/07/2019  . Screening for colorectal cancer 03/07/2019  . Mass of upper inner quadrant of right breast 03/07/2019  . Papanicolaou smear of cervix with positive high risk human papilloma virus (HPV) test 03/07/2019  . Breast mass in female 02/07/2019  . Abdominal pain 05/31/2018  . Leukocytosis 05/31/2018  . Tobacco abuse 05/31/2018  . Cannabinoid hyperemesis syndrome 03/11/2018  . Dermatitis 02/01/2018  . Hemorrhoids 02/22/2017  . Nicotine dependence, cigarettes, with other nicotine-induced  disorders 12/12/2016  . Thoracic spine pain 11/23/2016  . Nasal obstruction 11/23/2016  . Breast pain, right 03/23/2016  . Hyperlipemia 10/02/2015  . Premature ovarian failure 02/04/2015  . Pregnancy as incidental finding, low positive preg test, considered pituitary production of HCG 02/04/2015  . Abdominal pain, epigastric 08/27/2014  . Vitamin D deficiency 05/16/2014  . Alopecia 08/01/2013  . Asthma, cough variant 12/01/2012  . 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 Marijuana use 11/14/2012  . Seasonal allergies 11/14/2012  . Nausea without vomiting 11/09/2012  . Constipation 12/02/2011  . Gastroparesis 05/01/2011  . Pyloric spasm/Stenosis 03/30/2011  . Hip pain, right 03/02/2011  . Anemia 01/30/2011  . Gastric outlet obstruction 01/01/2011  . Intractable vomiting 12/24/2010  . Hypokalemia 12/24/2010  . COPD (chronic obstructive pulmonary disease) ([place]) 09/09/2010  . GERD (gastroesophageal reflux disease) 08/21/2010  . Headache 03/13/2008  . AMENORRHEA, SECONDARY 09/27/2007  . ECZEMA 09/27/2007  . Depression 07/01/2007  . Back pain with radiation 07/01/2007  . CLOSED FRACTURE OF METATARSAL BONE 01/27/2007    Past Surgical History:  Procedure Laterality Date  . BALLOON DILATION N/A 02/09/2013   Procedure: BALLOON DILATION;  Surgeon: Missy Sabins, MD;  Location: [facility];  Service: Endoscopy;  Laterality: N/A;  . BALLOON DILATION N/A 03/10/2018   Procedure: BALLOON DILATION;  Surgeon: Daneil Dolin, MD;  Location: [facility];  Service: Endoscopy;  Laterality: N/A;  . BILIARY  DILATION  06/01/2018   Procedure: PYLORIC DILATION;  Surgeon: Rush Landmark Telford Nab., MD;  Location: [facility];  Service: Gastroenterology;;  . BIOPSY  06/01/2018   Procedure: BIOPSY;  Surgeon: Irving Copas., MD;  Location: [facility];  Service: Gastroenterology;;  . Lum Keas INJECTION N/A 02/09/2013   Procedure: BOTOX INJECTION;  Surgeon: Missy Sabins, MD;   Location: [facility];  Service: Endoscopy;  Laterality: N/A;  possible balloon  . BOTOX INJECTION N/A 10/24/2015   Procedure: BOTOX INJECTION;  Surgeon: Daneil Dolin, MD;  [facility];  Service: Endoscopy;  Laterality: N/A;  . BOTOX INJECTION N/A 03/10/2018   Procedure: BOTOX INJECTION;  Surgeon: Daneil Dolin, MD;  [facility];  Service: Endoscopy;  Laterality: N/A;  Melanie notified  . BOTOX INJECTION  06/01/2018   Procedure: BOTOX INJECTION;  Surgeon: Rush Landmark Telford Nab., MD;  Location: [facility];  Service: Gastroenterology;;  . CARPAL TUNNEL RELEASE     left hand  . COLONOSCOPY WITH PROPOFOL N/A 09/20/2014   RMR: Normal ileo-colonoscopy  . ESOPHAGEAL DILATION  12/25/2010   Procedure: ESOPHAGEAL DILATION;  Surgeon: Dorothyann Peng, MD;  [facility];  Service: Endoscopy;;  . ESOPHAGEAL MANOMETRY N/A 10/03/2018   Procedure: ESOPHAGEAL MANOMETRY (EM);  Surgeon: Mauri Pole, MD;  Location: [facility];  Service: Endoscopy;  Laterality: N/A;  . ESOPHAGOGASTRODUODENOSCOPY  12/25/2010   Dorothyann Peng, MD;  moderate gastritis, ?goo secondary to pylorspasm. BX showed reactive gstropathy no h.pyori, SB mucosa with intramucosal lymphocytosis and partial villous blunting (TTG 4.0 normal)  . ESOPHAGOGASTRODUODENOSCOPY N/A 11/14/2012   [id number] hiatal hernia. Abnormal gastric mucosa of  uncertain significance-status post biopsy. Subjectively, patient may have recurrent symptomatic, pylorospasm.  . ESOPHAGOGASTRODUODENOSCOPY (EGD) WITH PROPOFOL N/A 02/09/2013   elongated stomach, partial lower esophageal ring widely patent. No obvious pyloric stenosis s/p Botox  . ESOPHAGOGASTRODUODENOSCOPY (EGD) WITH PROPOFOL N/A 10/24/2015   Procedure: ESOPHAGOGASTRODUODENOSCOPY (EGD) WITH PROPOFOL;  Surgeon: Daneil Dolin, MD;  [facility];  Service: Endoscopy;  Laterality: N/A;  830   . ESOPHAGOGASTRODUODENOSCOPY (EGD) WITH PROPOFOL N/A 03/10/2018   Dr.  Gala Romney: normal esophagus, elongated, dilated stomach likely stigmata of slow gastric emptying due to narrowing of pyloric channel, s/p balloon dilatation of pyloric channel. Small hiatal hernia, normal duodenal bulb and second portion of duodenum  . ESOPHAGOGASTRODUODENOSCOPY (EGD) WITH PROPOFOL N/A 06/01/2018   Procedure: ESOPHAGOGASTRODUODENOSCOPY (EGD) WITH PROPOFOL;  Surgeon: Rush Landmark Telford Nab., MD;  Location: [facility];  Service: Gastroenterology;  Laterality: N/A;  . LAPAROSCOPIC CHOLECYSTECTOMY  2002   [facility] Na?  [place]?  . laser surgery on cervix    . NASAL SEPTUM SURGERY  01/29/2017  . TOE SURGERY       OB History    Gravida  0   Para      Term      Preterm      AB      Living        SAB      IAB      Ectopic      Multiple      Live Births              Family History  Problem Relation Age of Onset  . Diabetes Father   . Liver disease Father        liver transplant at [facility], age 41  . Lung cancer Mother 62  . Heart disease Maternal Grandmother   . Parkinson's disease Maternal Grandfather   .  Multiple sclerosis Sister 36  . Depression Sister 80  . Alcohol abuse Sister   . Bipolar disorder Sister   . Schizophrenia Sister   . Colon cancer Neg Hx     Social History   Tobacco Use  . Smoking status: Current Every Day Smoker    Packs/day: 0.10    Years: 15.00    Pack years: 1.50    Types: Cigarettes  . Smokeless tobacco: Never Used  . Tobacco comment: 2 per day  Vaping Use  . Vaping Use: Never used  Substance Use Topics  . Alcohol use: No    Alcohol/week: 0.0 standard drinks  . Drug use: Yes    Frequency: 2.0 times per week    Types: Marijuana    Comment: trying to quit    Home Medications Prior to Admission medications   Medication Sig Start Date End Date Taking? Authorizing Provider  acetaminophen (TYLENOL) 325 MG tablet Take 2 tablets (650 mg total) by mouth every 6 (six) hours as needed for mild pain (or Fever >/= 101).  03/11/18   Emokpae, Courage, MD  azelastine (ASTELIN) 0.1 % nasal spray USE 2 SPRAYS IN EACH NOSTRIL TWICE DAILY AS DIRECTED 01/16/20   [name], MD  budesonide-formoterol (SYMBICORT) 80-4.5 MCG/ACT inhaler Inhale 2 puffs into the lungs 2 (two) times daily. 09/08/18   [name], MD  clotrimazole-betamethasone (LOTRISONE) cream Apply sparingly to rash on face once daily for 1 week 08/08/19   [name], MD  diclofenac (VOLTAREN) 75 MG EC tablet TAKE 1 TABLET(75 MG) BY MOUTH TWICE DAILY 08/19/18   Mcarthur Rossetti, MD  ergocalciferol (VITAMIN D2) 1.25 MG (50000 UT) capsule Take 1 capsule (50,000 Units total) by mouth once a week. One capsule once weekly 12/06/19   [name], MD  estradiol (ESTRACE) 0.1 MG/GM vaginal cream INSERT 7.61 APPLICATORFUL VAGINALLY THREE TIMES WEEKLY AS DIRECTED 11/28/19   Florian Buff, MD  estradiol (ESTRACE) 1 MG tablet TAKE 1 TABLET(1 MG) BY MOUTH DAILY 09/24/19   Jonnie Kind, MD  feeding supplement (ENSURE CLINICAL STRENGTH) LIQD Take 237 mLs by mouth 3 (three) times daily with meals. Patient taking differently: Take 237 mLs by mouth daily.  01/01/11   Dhungel, Flonnie Overman, MD  fluticasone (FLONASE) 50 MCG/ACT nasal spray SHAKE LIQUID AND USE 2 SPRAYS IN EACH NOSTRIL DAILY 11/29/18   [name], MD  gabapentin (NEURONTIN) 100 MG capsule Take 1 capsule (100 mg total) by mouth 3 (three) times daily. 02/06/20   [name], MD  hydrocortisone 2.5 % ointment  11/01/18   [provider]  hydroxychloroquine (PLAQUENIL) 200 MG tablet Take 200 mg by mouth daily. 06/08/18   [provider]  ketorolac (TORADOL) 10 MG tablet Take 1 tablet (10 mg total) by mouth every 8 (eight) hours as needed. 04/27/19   Larene Pickett, PA-C  linaclotide [facility]) 145 MCG CAPS capsule Take 1 capsule (145 mcg total) by mouth daily before breakfast. 05/02/19   Carlis Stable, NP  megestrol (MEGACE) 20 MG tablet TAKE 1 TABLET(20 MG)  BY MOUTH DAILY 01/03/20   [name], MD  metoCLOPramide (REGLAN) 10 MG tablet TAKE 1 TABLET(10 MG) BY MOUTH EVERY 8 HOURS AS NEEDED FOR NAUSEA 01/25/20   Erenest Rasher, PA-C  pantoprazole (PROTONIX) 40 MG tablet Take 1 tablet (40 mg total) by mouth 2 (two) times daily before a meal. 11/01/19   Carlis Stable, NP  potassium chloride SA (KLOR-CON) 20 MEQ  tablet TAKE 1 TABLET BY MOUTH EVERY DAY 07/14/19   [name], MD  pravastatin (PRAVACHOL) 20 MG tablet TAKE 1 TABLET(20 MG) BY MOUTH DAILY 10/16/19   [name], MD  progesterone (PROMETRIUM) 200 MG capsule TAKE 1 CAPSULE BY MOUTH EVERY DAY FOR 2 WEEKS EVERY THIRD MONTH: Quentin Mulling AND OCTOBER 08/18/19   Jonnie Kind, MD  propranolol (INDERAL) 10 MG tablet Take 1 tablet (10 mg total) by mouth 3 (three) times daily. 03/11/18   Roxan Hockey, MD  risperiDONE (RISPERDAL) 0.5 MG tablet Take 0.5 mg at bedtime by mouth.    [provider]  sertraline (ZOLOFT) 50 MG tablet Take 50 mg by mouth at bedtime.     [provider]  sucralfate (CARAFATE) 1 g tablet One tablet every morning and at bedtime for stomach burning. 08/26/18   Mahala Menghini, PA-C  tiZANidine (ZANAFLEX) 4 MG tablet TAKE 1 TABLET(4 MG) BY MOUTH BACK TWICE DAILY AS NEEDED FOR SPASMS 03/06/19   [name], MD  VENTOLIN HFA 108 [phone] Base) MCG/ACT inhaler INHALE 2 PUFFS INTO THE LUNGS EVERY 6 HOURS AS NEEDED FOR SHORTNESS OF BREATH 09/21/18   [name], MD  zonisamide (ZONEGRAN) 50 MG capsule Take 150 mg by mouth at bedtime.  02/21/17   [provider]    Allergies    Benadryl [diphenhydramine], Penicillins, and Latex  Review of Systems   Review of Systems  Constitutional: Negative for chills and fever.  HENT: Positive for ear pain. Negative for congestion, ear discharge, rhinorrhea and tinnitus.   Respiratory: Negative for cough and shortness of breath.   Cardiovascular: Negative for chest pain and  palpitations.  Gastrointestinal: Negative for diarrhea, nausea and vomiting.  Genitourinary: Negative for difficulty urinating and dysuria.  Musculoskeletal: Negative for arthralgias and back pain.  Skin: Negative for rash and wound.  Neurological: Negative for light-headedness and headaches.    Physical Exam Updated Vital Signs BP 130/75 (BP Location: Right Arm)   Pulse 83   Temp 98.9 F (37.2 C) (Oral)   Resp 16   LMP  (LMP Unknown) Comment: pt reports premature menopause  SpO2 100%   Physical Exam Vitals and nursing note reviewed. Exam conducted with a chaperone present.  Constitutional:      General: She is not in acute distress.    Appearance: Normal appearance.  HENT:     Head: Normocephalic and atraumatic.     Right Ear: There is impacted cerumen.     Left Ear: Tympanic membrane, ear canal and external ear normal. There is no impacted cerumen.     Nose: No rhinorrhea.  Eyes:     General:        Right eye: No discharge.        Left eye: No discharge.     Conjunctiva/sclera: Conjunctivae normal.  Cardiovascular:     Rate and Rhythm: Normal rate and regular rhythm.  Pulmonary:     Effort: Pulmonary effort is normal. No respiratory distress.     Breath sounds: No stridor.  Abdominal:     General: Abdomen is flat. There is no distension.     Palpations: Abdomen is soft.  Musculoskeletal:        General: No tenderness or signs of injury.  Skin:    General: Skin is warm and dry.  Neurological:     General: No focal deficit present.     Mental Status: She is alert. Mental status is at baseline.  Motor: No weakness.  Psychiatric:        Mood and Affect: Mood normal.        Behavior: Behavior normal.     ED Results / Procedures / Treatments   Labs (all labs ordered are listed, but only abnormal results are displayed) Labs Reviewed - No data to display  EKG None  Radiology No results found.  Procedures .Ear Cerumen Removal  Date/Time: 02/19/2020 5:21  PM Performed by: Breck Coons, MD Authorized by: Breck Coons, MD   Consent:    Consent obtained:  Verbal   Consent given by:  Patient   Risks, benefits, and alternatives were discussed: yes   Universal protocol:    Procedure explained and questions answered to patient or proxy's satisfaction: yes     Patient identity confirmed:  Verbally with patient Procedure details:    Location:  R ear   Procedure type: curette     Procedure outcomes: unable to remove cerumen (pt would not remain still)   Post-procedure details:    Procedure completion:  Procedure terminated at patient's request     Medications Ordered in ED Medications  ciprofloxacin-dexamethasone (CIPRODEX) 0.3-0.1 % OTIC (EAR) suspension 4 drop (has no administration in time range)    ED Course  I have reviewed the triage vital signs and the nursing notes.  Pertinent labs & imaging results that were available during my care of the patient were reviewed by me and considered in my medical decision making (see chart for details).    MDM Rules/Calculators/A&P                          Right otalgia likely secondary to wax impaction, home remedies unsuccessful to include over-the-counter medications.  No foreign body noted but cerumen impaction.  I attempted disimpaction with curette at bedside but patient would not hold still.  We will try irrigation by nursing.  Large amounts of irrigation done at bedside by nursing and myself this patient be discharged for outpatient ENT follow-up for further evaluation put on Ciprodex drops.  Strict return precautions given.  Final Clinical Impression(s) / ED Diagnoses Final diagnoses:  Acute otalgia, right  Foreign body of right ear, initial encounter    Rx / DC Orders ED Discharge Orders    None       Breck Coons, MD 02/19/20 1754

## 2020-02-20 DIAGNOSIS — H60331 Swimmer's ear, right ear: Secondary | ICD-10-CM | POA: Diagnosis not present

## 2020-02-21 DIAGNOSIS — F315 Bipolar disorder, current episode depressed, severe, with psychotic features: Secondary | ICD-10-CM | POA: Diagnosis not present

## 2020-03-18 DIAGNOSIS — M791 Myalgia, unspecified site: Secondary | ICD-10-CM | POA: Diagnosis not present

## 2020-03-18 DIAGNOSIS — G518 Other disorders of facial nerve: Secondary | ICD-10-CM | POA: Diagnosis not present

## 2020-03-18 DIAGNOSIS — G43719 Chronic migraine without aura, intractable, without status migrainosus: Secondary | ICD-10-CM | POA: Diagnosis not present

## 2020-03-18 DIAGNOSIS — M542 Cervicalgia: Secondary | ICD-10-CM | POA: Diagnosis not present

## 2020-03-22 ENCOUNTER — Other Ambulatory Visit: Payer: Self-pay | Admitting: Family Medicine

## 2020-03-26 ENCOUNTER — Encounter: Payer: Medicare HMO | Admitting: Family Medicine

## 2020-04-03 ENCOUNTER — Other Ambulatory Visit: Payer: Self-pay | Admitting: Family Medicine

## 2020-04-04 ENCOUNTER — Encounter: Payer: Medicare HMO | Admitting: Family Medicine

## 2020-04-11 ENCOUNTER — Other Ambulatory Visit: Payer: Self-pay | Admitting: Family Medicine

## 2020-04-18 ENCOUNTER — Encounter: Payer: Medicare HMO | Admitting: Family Medicine

## 2020-04-29 DIAGNOSIS — G43719 Chronic migraine without aura, intractable, without status migrainosus: Secondary | ICD-10-CM | POA: Diagnosis not present

## 2020-04-29 DIAGNOSIS — M542 Cervicalgia: Secondary | ICD-10-CM | POA: Diagnosis not present

## 2020-04-29 DIAGNOSIS — M791 Myalgia, unspecified site: Secondary | ICD-10-CM | POA: Diagnosis not present

## 2020-04-29 DIAGNOSIS — G518 Other disorders of facial nerve: Secondary | ICD-10-CM | POA: Diagnosis not present

## 2020-05-01 ENCOUNTER — Ambulatory Visit: Payer: Medicare HMO | Admitting: Nurse Practitioner

## 2020-05-06 ENCOUNTER — Telehealth (INDEPENDENT_AMBULATORY_CARE_PROVIDER_SITE_OTHER): Payer: Medicare HMO | Admitting: Nurse Practitioner

## 2020-05-06 ENCOUNTER — Encounter: Payer: Self-pay | Admitting: Nurse Practitioner

## 2020-05-06 ENCOUNTER — Encounter: Payer: Medicare HMO | Admitting: Family Medicine

## 2020-05-06 DIAGNOSIS — K529 Noninfective gastroenteritis and colitis, unspecified: Secondary | ICD-10-CM | POA: Diagnosis not present

## 2020-05-06 MED ORDER — ONDANSETRON 4 MG PO TBDP: 4.0000 mg | ORAL_TABLET | Freq: Three times a day (TID) | ORAL | 0 refills | Status: DC | PRN

## 2020-05-06 NOTE — Assessment & Plan Note (Signed)
-  started last night; possibly eating some of her neighbors food that she was not used to -Rx. zofran -discussed OTC imodium if diarrhea persists -we discussed drinking sips of water or ginger ale; if she is tolerating that well, can move to [employer], and from there to regular diet -discussed red flags to go to ED, persistent vomiting, diarrhea, and dehydration

## 2020-05-06 NOTE — Progress Notes (Signed)
Acute Office Visit  Subjective:    Patient ID: Tracey Daniel, female    DOB: [birth year], 40 y.o.   MRN: [id number]  Chief Complaint  Patient presents with  . Abdominal Pain    Started lastnmight, abdominal pain crampy pain, vomited lastnight once and then couple diarrhea     HPI Patient is in today for abdominal pain/cramping that started yesterday after eating some of her neighbor's food.  She has several episodes of diarrhea and vomited once.  No bloody stool.  Past Medical History:  Diagnosis Date  . Anemia of other chronic disease 11/29/2012  . Asthma   . Back pain, chronic   . Chronic abdominal pain   . Constipation   . COPD (chronic obstructive pulmonary disease) ([place])   . Cyst of skin    mid spine area  . Depression 2000   h/o suicidal ideation  . Gastric outlet obstruction   . Gastritis   . Gastroparesis   . Migraine   . Migraines   . Nausea and vomiting    recurrent  . Nicotine addiction   . Osteoporosis   . Peptic ulcer disease   . Pyloric spasm 03/30/2011  . Seasonal allergies   . Sinusitis   . Substance abuse ([place]) 2008   marijuana  . Vitamin D deficiency     Past Surgical History:  Procedure Laterality Date  . BALLOON DILATION N/A 02/09/2013   Procedure: BALLOON DILATION;  Surgeon: Missy Sabins, MD;  Location: [facility];  Service: Endoscopy;  Laterality: N/A;  . BALLOON DILATION N/A 03/10/2018   Procedure: BALLOON DILATION;  Surgeon: Daneil Dolin, MD;  [facility];  Service: Endoscopy;  Laterality: N/A;  . BILIARY DILATION  06/01/2018   Procedure: PYLORIC DILATION;  Surgeon: Rush Landmark Telford Nab., MD;  Location: [place];  Service: Gastroenterology;;  . BIOPSY  06/01/2018   Procedure: BIOPSY;  Surgeon: Irving Copas., MD;  Location: [facility];  Service: Gastroenterology;;  . Lum Keas INJECTION N/A 02/09/2013   Procedure: BOTOX INJECTION;  Surgeon: Missy Sabins, MD;  Location: [facility];  Service: Endoscopy;   Laterality: N/A;  possible balloon  . BOTOX INJECTION N/A 10/24/2015   Procedure: BOTOX INJECTION;  Surgeon: Daneil Dolin, MD;  [facility];  Service: Endoscopy;  Laterality: N/A;  . BOTOX INJECTION N/A 03/10/2018   Procedure: BOTOX INJECTION;  Surgeon: Daneil Dolin, MD;  [facility];  Service: Endoscopy;  Laterality: N/A;  Melanie notified  . BOTOX INJECTION  06/01/2018   Procedure: BOTOX INJECTION;  Surgeon: Rush Landmark Telford Nab., MD;  Location: [place];  Service: Gastroenterology;;  . CARPAL TUNNEL RELEASE     left hand  . COLONOSCOPY WITH PROPOFOL N/A 09/20/2014   RMR: Normal ileo-colonoscopy  . ESOPHAGEAL DILATION  12/25/2010   Procedure: ESOPHAGEAL DILATION;  Surgeon: Dorothyann Peng, MD;  [facility];  Service: Endoscopy;;  . ESOPHAGEAL MANOMETRY N/A 10/03/2018   Procedure: ESOPHAGEAL MANOMETRY (EM);  Surgeon: Mauri Pole, MD;  [facility];  Service: Endoscopy;  Laterality: N/A;  . ESOPHAGOGASTRODUODENOSCOPY  12/25/2010   Dorothyann Peng, MD;  moderate gastritis, ?goo secondary to pylorspasm. BX showed reactive gstropathy no h.pyori, SB mucosa with intramucosal lymphocytosis and partial villous blunting (TTG 4.0 normal)  . ESOPHAGOGASTRODUODENOSCOPY N/A 11/14/2012   HFW:YOVZC hiatal hernia. Abnormal gastric mucosa of  uncertain significance-status post biopsy. Subjectively, patient may have recurrent symptomatic, pylorospasm.  . ESOPHAGOGASTRODUODENOSCOPY (EGD) WITH PROPOFOL N/A 02/09/2013  elongated stomach, partial lower esophageal ring widely patent. No obvious pyloric stenosis s/p Botox  . ESOPHAGOGASTRODUODENOSCOPY (EGD) WITH PROPOFOL N/A 10/24/2015   Procedure: ESOPHAGOGASTRODUODENOSCOPY (EGD) WITH PROPOFOL;  Surgeon: Daneil Dolin, MD;  [facility];  Service: Endoscopy;  Laterality: N/A;  830   . ESOPHAGOGASTRODUODENOSCOPY (EGD) WITH PROPOFOL N/A 03/10/2018   Dr. Gala Romney: normal esophagus, elongated, dilated  stomach likely stigmata of slow gastric emptying due to narrowing of pyloric channel, s/p balloon dilatation of pyloric channel. Small hiatal hernia, normal duodenal bulb and second portion of duodenum  . ESOPHAGOGASTRODUODENOSCOPY (EGD) WITH PROPOFOL N/A 06/01/2018   Procedure: ESOPHAGOGASTRODUODENOSCOPY (EGD) WITH PROPOFOL;  Surgeon: Rush Landmark Telford Nab., MD;  Location: [place];  Service: Gastroenterology;  Laterality: N/A;  . LAPAROSCOPIC CHOLECYSTECTOMY  2002   [place]?  [place]?  . laser surgery on cervix    . NASAL SEPTUM SURGERY  01/29/2017  . TOE SURGERY      Family History  Problem Relation Age of Onset  . Diabetes Father   . Liver disease Father        liver transplant at [facility], age 38  . Lung cancer Mother 81  . Heart disease Maternal Grandmother   . Parkinson's disease Maternal Grandfather   . Multiple sclerosis Sister 42  . Depression Sister 49  . Alcohol abuse Sister   . Bipolar disorder Sister   . Schizophrenia Sister   . Colon cancer Neg Hx     Social History   Socioeconomic History  . Marital status: Significant Other    Spouse name: Not on file  . Number of children: 0  . Years of education: 9  . Highest education level: 9th grade  Occupational History  . Occupation: disability    Employer: UNEMPLOYED  Tobacco Use  . Smoking status: Current Every Day Smoker    Packs/day: 0.10    Years: 15.00    Pack years: 1.50    Types: Cigarettes  . Smokeless tobacco: Never Used  . Tobacco comment: 2 per day  Vaping Use  . Vaping Use: Never used  Substance and Sexual Activity  . Alcohol use: No    Alcohol/week: 0.0 standard drinks  . Drug use: Yes    Frequency: 2.0 times per week    Types: Marijuana    Comment: trying to quit  . Sexual activity: Yes    Birth control/protection: None  Other Topics Concern  . Not on file  Social History Narrative  . Not on file   Social Determinants of Health   Financial Resource Strain: Low Risk   .  Difficulty of Paying Living Expenses: Not very hard  Food Insecurity: Food Insecurity Present  . Worried About Charity fundraiser in the Last Year: Sometimes true  . Ran Out of Food in the Last Year: Sometimes true  Transportation Needs: No Transportation Needs  . Lack of Transportation (Medical): No  . Lack of Transportation (Non-Medical): No  Physical Activity: Insufficiently Active  . Days of Exercise per Week: 4 days  . Minutes of Exercise per Session: 20 min  Stress: No Stress Concern Present  . Feeling of Stress : Only a little  Social Connections: Moderately Isolated  . Frequency of Communication with Friends and Family: Three times a week  . Frequency of Social Gatherings with Friends and Family: Twice a week  . Attends Religious Services: Never  . Active Member of Clubs or Organizations: No  . Attends Archivist Meetings: Never  . Marital  Status: Living with partner  Intimate Partner Violence: Not At Risk  . Fear of Current or Ex-Partner: No  . Emotionally Abused: No  . Physically Abused: No  . Sexually Abused: No    Outpatient Medications Prior to Visit  Medication Sig Dispense Refill  . acetaminophen (TYLENOL) 325 MG tablet Take 2 tablets (650 mg total) by mouth every 6 (six) hours as needed for mild pain (or Fever >/= 101). 20 tablet 0  . azelastine (ASTELIN) 0.1 % nasal spray USE 2 SPRAYS IN EACH NOSTRIL TWICE DAILY AS DIRECTED 30 mL 2  . budesonide-formoterol (SYMBICORT) 80-4.5 MCG/ACT inhaler Inhale 2 puffs into the lungs 2 (two) times daily. 1 Inhaler 3  . clotrimazole-betamethasone (LOTRISONE) cream Apply sparingly to rash on face once daily for 1 week 15 g 0  . diclofenac (VOLTAREN) 75 MG EC tablet TAKE 1 TABLET(75 MG) BY MOUTH TWICE DAILY 180 tablet 1  . ergocalciferol (VITAMIN D2) 1.25 MG (50000 UT) capsule Take 1 capsule (50,000 Units total) by mouth once a week. One capsule once weekly 12 capsule 1  . estradiol (ESTRACE) 0.1 MG/GM vaginal cream  INSERT 8.41 APPLICATORFUL VAGINALLY THREE TIMES WEEKLY AS DIRECTED 42.5 g 4  . estradiol (ESTRACE) 1 MG tablet TAKE 1 TABLET(1 MG) BY MOUTH DAILY 90 tablet 3  . feeding supplement (ENSURE CLINICAL STRENGTH) LIQD Take 237 mLs by mouth 3 (three) times daily with meals. (Patient taking differently: Take 237 mLs by mouth daily.) 10 Bottle 3  . fluticasone (FLONASE) 50 MCG/ACT nasal spray SHAKE LIQUID AND USE 2 SPRAYS IN EACH NOSTRIL DAILY 48 g 2  . gabapentin (NEURONTIN) 100 MG capsule Take 1 capsule (100 mg total) by mouth 3 (three) times daily. 90 capsule 5  . hydrocortisone 2.5 % ointment     . hydroxychloroquine (PLAQUENIL) 200 MG tablet Take 200 mg by mouth daily.    Marland Kitchen ketorolac (TORADOL) 10 MG tablet Take 1 tablet (10 mg total) by mouth every 8 (eight) hours as needed. 18 tablet 0  . linaclotide (LINZESS) 145 MCG CAPS capsule Take 1 capsule (145 mcg total) by mouth daily before breakfast. 30 capsule 5  . megestrol (MEGACE) 20 MG tablet TAKE 1 TABLET(20 MG) BY MOUTH DAILY 30 tablet 2  . metoCLOPramide (REGLAN) 10 MG tablet TAKE 1 TABLET(10 MG) BY MOUTH EVERY 8 HOURS AS NEEDED FOR NAUSEA 90 tablet 2  . pantoprazole (PROTONIX) 40 MG tablet Take 1 tablet (40 mg total) by mouth 2 (two) times daily before a meal. 180 tablet 3  . potassium chloride SA (KLOR-CON) 20 MEQ tablet TAKE 1 TABLET BY MOUTH EVERY DAY 90 tablet 3  . pravastatin (PRAVACHOL) 20 MG tablet TAKE 1 TABLET(20 MG) BY MOUTH DAILY 90 tablet 1  . progesterone (PROMETRIUM) 200 MG capsule TAKE 1 CAPSULE BY MOUTH EVERY DAY FOR 2 WEEKS EVERY THIRD MONTH: JANUARY, APRIL, JULY AND OCTOBER 14 capsule 3  . propranolol (INDERAL) 10 MG tablet Take 1 tablet (10 mg total) by mouth 3 (three) times daily. 90 tablet 2  . risperiDONE (RISPERDAL) 0.5 MG tablet Take 0.5 mg at bedtime by mouth.    . sertraline (ZOLOFT) 50 MG tablet Take 50 mg by mouth at bedtime.     . sucralfate (CARAFATE) 1 g tablet One tablet every morning and at bedtime for stomach  burning. 180 tablet 3  . tiZANidine (ZANAFLEX) 4 MG tablet TAKE 1 TABLET(4 MG) BY MOUTH BACK TWICE DAILY AS NEEDED FOR SPASMS 180 tablet 1  . VENTOLIN HFA 108 (  90 Base) MCG/ACT inhaler INHALE 2 PUFFS INTO THE LUNGS EVERY 6 HOURS AS NEEDED FOR SHORTNESS OF BREATH 18 g 0  . zonisamide (ZONEGRAN) 50 MG capsule Take 150 mg by mouth at bedtime.   2   No facility-administered medications prior to visit.    Allergies  Allergen Reactions  . Benadryl [Diphenhydramine] Anaphylaxis  . Penicillins Other (See Comments)    Unknown Has patient had a PCN reaction causing immediate rash, facial/tongue/throat swelling, SOB or lightheadedness with hypotension Yes Has patient had a PCN reaction causing severe rash involving mucus membranes or skin necrosis: Yes-hives  Has patient had a PCN reaction that required hospitalization Yes Has patient had a PCN reaction occurring within the last 10 years: Yes If all of the above answers are "NO", then may proceed with Cephalosporin use.   . Latex Itching and Other (See Comments)    "burning" where the tape goes    Review of Systems  Constitutional: Negative for chills and fever.  Respiratory: Negative.   Cardiovascular: Negative.   Gastrointestinal: Positive for abdominal pain, diarrhea, nausea and vomiting. Negative for blood in stool and constipation.       Objective:    Physical Exam  LMP  (LMP Unknown) Comment: pt reports premature menopause Wt Readings from Last 3 Encounters:  12/05/19 159 lb 1.9 oz (72.2 kg)  11/13/19 156 lb (70.8 kg)  11/01/19 156 lb (70.8 kg)    There are no preventive care reminders to display for this patient.  There are no preventive care reminders to display for this patient.   Lab Results  Component Value Date   TSH 2.470 12/05/2019   Lab Results  Component Value Date   WBC 10.2 12/05/2019   HGB 11.2 12/05/2019   HCT 32.5 (L) 12/05/2019   MCV 93 12/05/2019   PLT 188 12/05/2019   Lab Results  Component  Value Date   NA 136 04/26/2019   K 4.1 04/26/2019   CO2 22 04/26/2019   GLUCOSE 101 (H) 04/26/2019   BUN 17 04/26/2019   CREATININE 0.89 04/26/2019   BILITOT 0.3 11/11/2018   ALKPHOS 65 06/22/2018   AST 31 (H) 11/11/2018   ALT 29 11/11/2018   PROT 7.1 11/11/2018   ALBUMIN 4.6 06/22/2018   CALCIUM 9.4 04/26/2019   ANIONGAP 7 04/26/2019   Lab Results  Component Value Date   CHOL 151 11/11/2018   Lab Results  Component Value Date   HDL 42 (L) 11/11/2018   Lab Results  Component Value Date   LDLCALC 90 11/11/2018   Lab Results  Component Value Date   TRIG 93 11/11/2018   Lab Results  Component Value Date   CHOLHDL 3.6 11/11/2018   Lab Results  Component Value Date   HGBA1C 5.0 06/02/2018       Assessment & Plan:   Problem List Items Addressed This Visit      Digestive   Gastroenteritis    -started last night; possibly eating some of her neighbors food that she was not used to -Rx. zofran -discussed OTC imodium if diarrhea persists -we discussed drinking sips of water or ginger ale; if she is tolerating that well, can move to [employer], and from there to regular diet -discussed red flags to go to ED, persistent vomiting, diarrhea, and dehydration          No orders of the defined types were placed in this encounter.  Date:  05/06/2020   Location of Patient: Home Location of Provider:  Office Consent was obtain for visit to be over via telehealth. I verified that I am speaking with the correct person using two identifiers.  I connected with  Tracey Daniel on 05/06/20 via telephone and verified that I am speaking with the correct person using two identifiers.   I discussed the limitations of evaluation and management by telemedicine. The patient expressed understanding and agreed to proceed.  Time spent: 8 min   Noreene Larsson, NP

## 2020-05-22 ENCOUNTER — Encounter: Payer: Self-pay | Admitting: Gastroenterology

## 2020-05-23 ENCOUNTER — Other Ambulatory Visit: Payer: Self-pay | Admitting: Family Medicine

## 2020-06-12 ENCOUNTER — Encounter: Payer: Self-pay | Admitting: Family Medicine

## 2020-06-12 ENCOUNTER — Other Ambulatory Visit: Payer: Self-pay

## 2020-06-12 ENCOUNTER — Ambulatory Visit (INDEPENDENT_AMBULATORY_CARE_PROVIDER_SITE_OTHER): Payer: Medicare HMO | Admitting: Family Medicine

## 2020-06-12 VITALS — BP 112/71 | HR 70 | Temp 97.6°F | Ht 66.0 in | Wt 168.0 lb

## 2020-06-12 DIAGNOSIS — Z0000 Encounter for general adult medical examination without abnormal findings: Secondary | ICD-10-CM | POA: Diagnosis not present

## 2020-06-12 DIAGNOSIS — Z1231 Encounter for screening mammogram for malignant neoplasm of breast: Secondary | ICD-10-CM

## 2020-06-12 DIAGNOSIS — Z720 Tobacco use: Secondary | ICD-10-CM | POA: Diagnosis not present

## 2020-06-12 DIAGNOSIS — L309 Dermatitis, unspecified: Secondary | ICD-10-CM

## 2020-06-12 DIAGNOSIS — D509 Iron deficiency anemia, unspecified: Secondary | ICD-10-CM

## 2020-06-12 DIAGNOSIS — E782 Mixed hyperlipidemia: Secondary | ICD-10-CM

## 2020-06-12 DIAGNOSIS — R87810 Cervical high risk human papillomavirus (HPV) DNA test positive: Secondary | ICD-10-CM

## 2020-06-12 DIAGNOSIS — E559 Vitamin D deficiency, unspecified: Secondary | ICD-10-CM

## 2020-06-12 DIAGNOSIS — E785 Hyperlipidemia, unspecified: Secondary | ICD-10-CM | POA: Diagnosis not present

## 2020-06-12 DIAGNOSIS — F17218 Nicotine dependence, cigarettes, with other nicotine-induced disorders: Secondary | ICD-10-CM | POA: Diagnosis not present

## 2020-06-12 DIAGNOSIS — L659 Nonscarring hair loss, unspecified: Secondary | ICD-10-CM

## 2020-06-12 MED ORDER — DICLOFENAC SODIUM 75 MG PO TBEC: DELAYED_RELEASE_TABLET | ORAL | 1 refills | Status: DC

## 2020-06-12 NOTE — Assessment & Plan Note (Addendum)
Refer to dermatology for eval and management, reports worsening hair loss

## 2020-06-12 NOTE — Assessment & Plan Note (Signed)
Refer to dermatology for management

## 2020-06-12 NOTE — Assessment & Plan Note (Signed)
Updated pap and elects to have Gyne do this, will refer

## 2020-06-12 NOTE — Assessment & Plan Note (Signed)
Asked:confirms currently smokes cigarettes, 1 PPD Assess: Unwilling to set a quit date, but is cutting back Advise: needs to QUIT to reduce risk of cancer, cardio and cerebrovascular disease Assist: counseled for 5 minutes and literature provided Arrange: follow up in 2 to 4 months  

## 2020-06-12 NOTE — Assessment & Plan Note (Signed)

## 2020-06-12 NOTE — Progress Notes (Signed)
    Tracey Daniel     MRN: [id number]      DOB: 1980/04/23  HPI: Patient is in for annual physical exam. C/o hair loss, wants help smoking up to 1 PPd again, reports increasedstress and still needs a therapist. She does want to quit smoking Mental health improved on current medication Better able to eat currently with weight gain  PE: BP 112/71 (BP Location: Right Arm, Patient Position: Sitting, Cuff Size: Normal)   Pulse 70   Temp 97.6 F (36.4 C) (Temporal)   Ht 5\' 6"  (1.676 m)   Wt 168 lb (76.2 kg)   LMP  (LMP Unknown) Comment: pt reports premature menopause  SpO2 98%   BMI 27.12 kg/m   Pleasant  female, alert and oriented x 3, in no cardio-pulmonary distress. Afebrile. HEENT No facial trauma or asymetry. Sinuses non tender.  Extra occullar muscles intact.. External ears normal, . Neck: supple, no adenopathy,JVD or thyromegaly.No bruits.  Chest: Clear to ascultation bilaterally.No crackles or wheezes. Non tender to palpation  Breast: No asymetry,no masses or lumps. No tenderness. No nipple discharge or inversion. No axillary or supraclavicular adenopathy  Cardiovascular system; Heart sounds normal,  S1 and  S2 ,no S3.  No murmur, or thrill. Apical beat not displaced Peripheral pulses normal.  Abdomen: Soft, non tender, no organomegaly or masses. No bruits. Bowel sounds normal. No guarding, tenderness or rebound.   GU: Deferred to [facility]   Musculoskeletal exam: Full ROM of spine, hips , shoulders and knees. No deformity ,swelling or crepitus noted. No muscle wasting or atrophy.   Neurologic: Cranial nerves 2 to 12 intact. Power, tone ,sensation and reflexes normal throughout. No disturbance in gait. No tremor.  Skin: Intact, no ulceration, erythema , scaling or rash noted. Hyperpigmented macular lesions on chest, hair loss on scalp  Psych; Normal mood and affect. Judgement and concentration normal   Assessment & Plan:  Nicotine dependence,  cigarettes, with other nicotine-induced disorders Asked:confirms currently smokes cigarettes, 1 PPD Assess: Unwilling to set a quit date, but is cutting back Advise: needs to QUIT to reduce risk of cancer, cardio and cerebrovascular disease Assist: counseled for 5 minutes and literature provided Arrange: follow up in 2 to 4 months   Papanicolaou smear of cervix with positive high risk human papilloma virus (HPV) test Updated pap and elects to have Gyne do this, will refer  Annual physical exam Annual exam as documented. Counseling done  re healthy lifestyle involving commitment to 150 minutes exercise per week, heart healthy diet, and attaining healthy weight.The importance of adequate sleep also discussed. Regular seat belt use and home safety, is also discussed. Changes in health habits are decided on by the patient with goals and time frames  set for achieving them. Immunization and cancer screening needs are specifically addressed at this visit.   Alopecia Refer to dermatology for eval and management, reports worsening hair loss  Dermatitis Refer to dermatology for management

## 2020-06-12 NOTE — Patient Instructions (Addendum)
F/u In 5 months, call if you need me sooner  Labs today, lipid, hepatic, vit D, and cBC  Please schedule mammogram at checkout  You are referred for pelvic and pap  You are referred to Dermatology  Please cut back smoking even 2 cigarettes each month, to protect and improve your health  Please work on getting a therapist    Steps to Quit Smoking Smoking tobacco is the leading cause of preventable death. It can affect almost every organ in the body. Smoking puts you and people around you at risk for many serious, long-lasting (chronic) diseases. Quitting smoking can be hard, but it is one of the best things that you can do for your health. It is never too late to quit. How do I get ready to quit? When you decide to quit smoking, make a plan to help you succeed. Before you quit:  Pick a date to quit. Set a date within the next 2 weeks to give you time to prepare.  Write down the reasons why you are quitting. Keep this list in places where you will see it often.  Tell your family, friends, and co-workers that you are quitting. Their support is important.  Talk with your doctor about the choices that may help you quit.  Find out if your health insurance will pay for these treatments.  Know the people, places, things, and activities that make you want to smoke (triggers). Avoid them. What first steps can I take to quit smoking?  Throw away all cigarettes at home, at work, and in your car.  Throw away the things that you use when you smoke, such as ashtrays and lighters.  Clean your car. Make sure to empty the ashtray.  Clean your home, including curtains and carpets. What can I do to help me quit smoking? Talk with your doctor about taking medicines and seeing a counselor at the same time. You are more likely to succeed when you do both.  If you are pregnant or breastfeeding, talk with your doctor about counseling or other ways to quit smoking. Do not take medicine to help you  quit smoking unless your doctor tells you to do so. To quit smoking: Quit right away  Quit smoking totally, instead of slowly cutting back on how much you smoke over a period of time.  Go to counseling. You are more likely to quit if you go to counseling sessions regularly. Take medicine You may take medicines to help you quit. Some medicines need a prescription, and some you can buy over-the-counter. Some medicines may contain a drug called nicotine to replace the nicotine in cigarettes. Medicines may:  Help you to stop having the desire to smoke (cravings).  Help to stop the problems that come when you stop smoking (withdrawal symptoms). Your doctor may ask you to use:  Nicotine patches, gum, or lozenges.  Nicotine inhalers or sprays.  Non-nicotine medicine that is taken by mouth. Find resources Find resources and other ways to help you quit smoking and remain smoke-free after you quit. These resources are most helpful when you use them often. They include:  Online chats with a Social worker.  Phone quitlines.  Printed Furniture conservator/restorer.  Support groups or group counseling.  Text messaging programs.  Mobile phone apps. Use apps on your mobile phone or tablet that can help you stick to your quit plan. There are many free apps for mobile phones and tablets as well as websites. Examples include Quit Guide from the  CDC and smokefree.gov   What things can I do to make it easier to quit?  Talk to your family and friends. Ask them to support and encourage you.  Call a phone quitline (1-800-QUIT-NOW), reach out to support groups, or work with a Social worker.  Ask people who smoke to not smoke around you.  Avoid places that make you want to smoke, such as: ? Bars. ? Parties. ? Smoke-break areas at work.  Spend time with people who do not smoke.  Lower the stress in your life. Stress can make you want to smoke. Try these things to help your stress: ? Getting regular  exercise. ? Doing deep-breathing exercises. ? Doing yoga. ? Meditating. ? Doing a body scan. To do this, close your eyes, focus on one area of your body at a time from head to toe. Notice which parts of your body are tense. Try to relax the muscles in those areas.   How will I feel when I quit smoking? Day 1 to 3 weeks Within the first 24 hours, you may start to have some problems that come from quitting tobacco. These problems are very bad 2-3 days after you quit, but they do not often last for more than 2-3 weeks. You may get these symptoms:  Mood swings.  Feeling restless, nervous, angry, or annoyed.  Trouble concentrating.  Dizziness.  Strong desire for high-sugar foods and nicotine.  Weight gain.  Trouble pooping (constipation).  Feeling like you may vomit (nausea).  Coughing or a sore throat.  Changes in how the medicines that you take for other issues work in your body.  Depression.  Trouble sleeping (insomnia). Week 3 and afterward After the first 2-3 weeks of quitting, you may start to notice more positive results, such as:  Better sense of smell and taste.  Less coughing and sore throat.  Slower heart rate.  Lower blood pressure.  Clearer skin.  Better breathing.  Fewer sick days. Quitting smoking can be hard. Do not give up if you fail the first time. Some people need to try a few times before they succeed. Do your best to stick to your quit plan, and talk with your doctor if you have any questions or concerns. Summary  Smoking tobacco is the leading cause of preventable death. Quitting smoking can be hard, but it is one of the best things that you can do for your health.  When you decide to quit smoking, make a plan to help you succeed.  Quit smoking right away, not slowly over a period of time.  When you start quitting, seek help from your doctor, family, or friends. This information is not intended to replace advice given to you by your health  care provider. Make sure you discuss any questions you have with your health care provider. Document Revised: 09/30/2018 Document Reviewed: 03/26/2018 Elsevier Patient Education  [employer].

## 2020-06-13 ENCOUNTER — Other Ambulatory Visit: Payer: Self-pay | Admitting: Family Medicine

## 2020-06-13 LAB — CBC WITH DIFFERENTIAL
Basophils Absolute: 0.1 10*3/uL (ref 0.0–0.2)
Basos: 1 %
EOS (ABSOLUTE): 0.1 10*3/uL (ref 0.0–0.4)
Eos: 1 %
Hematocrit: 37.5 % (ref 34.0–46.6)
Hemoglobin: 12.4 g/dL (ref 11.1–15.9)
Immature Grans (Abs): 0 10*3/uL (ref 0.0–0.1)
Immature Granulocytes: 0 %
Lymphocytes Absolute: 2.4 10*3/uL (ref 0.7–3.1)
Lymphs: 24 %
MCH: 30.5 pg (ref 26.6–33.0)
MCHC: 33.1 g/dL (ref 31.5–35.7)
MCV: 92 fL (ref 79–97)
Monocytes Absolute: 0.9 10*3/uL (ref 0.1–0.9)
Monocytes: 9 %
Neutrophils Absolute: 6.6 10*3/uL (ref 1.4–7.0)
Neutrophils: 65 %
RBC: 4.06 x10E6/uL (ref 3.77–5.28)
RDW: 12.3 % (ref 11.7–15.4)
WBC: 10.1 10*3/uL (ref 3.4–10.8)

## 2020-06-13 LAB — HEPATIC FUNCTION PANEL
ALT: 13 IU/L (ref 0–32)
AST: 14 IU/L (ref 0–40)
Albumin: 4.8 g/dL (ref 3.8–4.8)
Alkaline Phosphatase: 103 IU/L (ref 44–121)
Bilirubin Total: 0.3 mg/dL (ref 0.0–1.2)
Bilirubin, Direct: <0.1 mg/dL (ref 0.00–0.40)
Total Protein: 7.6 g/dL (ref 6.0–8.5)

## 2020-06-13 LAB — LIPID PANEL
Chol/HDL Ratio: 5 ratio — ABNORMAL HIGH (ref 0.0–4.4)
Cholesterol, Total: 200 mg/dL — ABNORMAL HIGH (ref 100–199)
HDL: 40 mg/dL (ref >39)
LDL Chol Calc (NIH): 117 mg/dL — ABNORMAL HIGH (ref 0–99)
Triglycerides: 244 mg/dL — ABNORMAL HIGH (ref 0–149)
VLDL Cholesterol Cal: 43 mg/dL — ABNORMAL HIGH (ref 5–40)

## 2020-06-13 LAB — VITAMIN D 25 HYDROXY (VIT D DEFICIENCY, FRACTURES): Vit D, 25-Hydroxy: 49.1 ng/mL (ref 30.0–100.0)

## 2020-06-13 MED ORDER — ROSUVASTATIN CALCIUM 20 MG PO TABS: 20.0000 mg | ORAL_TABLET | Freq: Every day | ORAL | 5 refills | Status: DC

## 2020-06-14 DIAGNOSIS — F333 Major depressive disorder, recurrent, severe with psychotic symptoms: Secondary | ICD-10-CM | POA: Diagnosis not present

## 2020-06-19 DIAGNOSIS — G518 Other disorders of facial nerve: Secondary | ICD-10-CM | POA: Diagnosis not present

## 2020-06-19 DIAGNOSIS — G43719 Chronic migraine without aura, intractable, without status migrainosus: Secondary | ICD-10-CM | POA: Diagnosis not present

## 2020-06-19 DIAGNOSIS — M542 Cervicalgia: Secondary | ICD-10-CM | POA: Diagnosis not present

## 2020-06-19 DIAGNOSIS — M791 Myalgia, unspecified site: Secondary | ICD-10-CM | POA: Diagnosis not present

## 2020-06-28 ENCOUNTER — Ambulatory Visit: Payer: Medicare HMO | Admitting: Gastroenterology

## 2020-07-01 ENCOUNTER — Encounter: Payer: Self-pay | Admitting: Adult Health

## 2020-07-01 ENCOUNTER — Other Ambulatory Visit: Payer: Self-pay

## 2020-07-01 ENCOUNTER — Other Ambulatory Visit (HOSPITAL_COMMUNITY)
Admission: RE | Admit: 2020-07-01 | Discharge: 2020-07-01 | Disposition: A | Discharge status: Home / Self Care | Payer: Medicare HMO | Source: Ambulatory Visit | Attending: Adult Health | Admitting: Adult Health

## 2020-07-01 ENCOUNTER — Ambulatory Visit (INDEPENDENT_AMBULATORY_CARE_PROVIDER_SITE_OTHER): Payer: Medicare HMO | Admitting: Adult Health

## 2020-07-01 VITALS — BP 137/75 | HR 78 | Ht 66.0 in | Wt 168.0 lb

## 2020-07-01 DIAGNOSIS — R232 Flushing: Secondary | ICD-10-CM

## 2020-07-01 DIAGNOSIS — Z1151 Encounter for screening for human papillomavirus (HPV): Secondary | ICD-10-CM | POA: Insufficient documentation

## 2020-07-01 DIAGNOSIS — Z1211 Encounter for screening for malignant neoplasm of colon: Secondary | ICD-10-CM | POA: Insufficient documentation

## 2020-07-01 DIAGNOSIS — Z79890 Hormone replacement therapy (postmenopausal): Secondary | ICD-10-CM | POA: Diagnosis not present

## 2020-07-01 DIAGNOSIS — R87810 Cervical high risk human papillomavirus (HPV) DNA test positive: Secondary | ICD-10-CM | POA: Diagnosis not present

## 2020-07-01 DIAGNOSIS — Z01419 Encounter for gynecological examination (general) (routine) without abnormal findings: Secondary | ICD-10-CM | POA: Insufficient documentation

## 2020-07-01 LAB — HEMOCCULT GUIAC POC 1CARD (OFFICE): Fecal Occult Blood, POC: NEGATIVE

## 2020-07-01 MED ORDER — ESTRADIOL 2 MG PO TABS
2.0000 mg | ORAL_TABLET | Freq: Every day | ORAL | 11 refills | Status: DC
Start: 2020-07-01 — End: 2021-07-01

## 2020-07-01 MED ORDER — PROGESTERONE 200 MG PO CAPS: ORAL_CAPSULE | ORAL | 3 refills | Status: DC

## 2020-07-01 MED ORDER — ESTRADIOL 0.1 MG/GM VA CREA: TOPICAL_CREAM | VAGINAL | 4 refills | Status: DC

## 2020-07-01 NOTE — Progress Notes (Signed)
Subjective:     Patient ID: Tracey Daniel, female   DOB: 1980/06/05, 40 y.o.   MRN: [id number]  HPI Tracey Daniel is a 40 year old black female, with SO, PM, in for a pap and pelvic, she had physical with Tracey Daniel. Tracey Helper, MD is PCP   Review of Systems Patient denies any headaches, hearing loss, fatigue, blurred vision, shortness of breath, chest pain, abdominal pain, problems with bowel movements, urination, or intercourse. No joint pain or mood swings. +Hot flashes    Reviewed past medical,surgical, social and family history. Reviewed medications and allergies.  Objective:   Physical Exam BP 137/75 (BP Location: Left Arm, Patient Position: Sitting, Cuff Size: Normal)   Pulse 78   Ht 5\' 6"  (1.676 m)   Wt 168 lb (76.2 kg)   LMP  (LMP Unknown) Comment: pt reports premature menopause  BMI 27.12 kg/m  Skin warm and dry.Pelvic: external genitalia is normal in appearance no lesions, vagina: white discharge with odor,urethra has no lesions or masses noted, cervix:smooth,pap with GC/CHL and HR HPV genotyping was performed  , uterus: normal size, shape and contour, non tender, no masses felt, adnexa: no masses or tenderness noted. Bladder is non tender and no masses felt.On rectal exam has good tone, no masses,or hemorrhoids felt and hemoccult was negative    AA is 0 Fall risk is low Depression screen [facility] 2/9 07/01/2020 06/12/2020 12/05/2019  Decreased Interest 1 0 0  Down, Depressed, Hopeless 0 0 0  PHQ - 2 Score 1 0 0  Altered sleeping 0 - -  Tired, decreased energy 0 - -  Change in appetite 0 - -  Feeling bad or failure about yourself  3 - -  Trouble concentrating 0 - -  Moving slowly or fidgety/restless 0 - -  Suicidal thoughts 0 - -  PHQ-9 Score 4 - -  Difficult doing work/chores - - -  Some recent data might be hidden    GAD 7 : Generalized Anxiety Score 07/01/2020  Nervous, Anxious, on Edge 3  Control/stop worrying 3  Worry too much - different things 1  Trouble  relaxing 0  Restless 0  Easily annoyed or irritable 3  Afraid - awful might happen 3  Total GAD 7 Score 13      Upstream - 07/01/20 1139       Pregnancy Intention Screening   Does the patient want to become pregnant in the next year? No    Does the patient's partner want to become pregnant in the next year? No    Would the patient like to discuss contraceptive options today? No      Contraception Wrap Up   Current Method No Method - Other Reason   postmenopause   End Method No Method - Other Reason    Contraception Counseling Provided No            Examination chaperoned by Tish RN  Assessment:    1. Encounter for routine gynecological examination with Papanicolaou smear of cervix Pap sent Pap in 3 years if normal Physical with PCP Labs with PCP Mammogram yearly  2. Papanicolaou smear of cervix with positive high risk human papilloma virus (HPV) test Pap sent   3. Encounter for screening fecal occult blood testing   4. Post-menopause on HRT (hormone replacement therapy) Meds refilled Meds ordered this encounter  Medications   estradiol (ESTRACE) 2 MG tablet    Sig: Take 1 tablet (2 mg total) by mouth daily.  Dispense:  30 tablet    Refill:  11    Order Specific Question:   Supervising Provider    Answer:   Tania Ade H [[id number]]   estradiol (ESTRACE) 0.1 MG/GM vaginal cream    Sig: INSERT 6.24 APPLICATORFUL VAGINALLY THREE TIMES WEEKLY AS DIRECTED    Dispense:  42.5 g    Refill:  4    Order Specific Question:   Supervising Provider    Answer:   Elonda Husky, LUTHER H [[id number]]   progesterone (PROMETRIUM) 200 MG capsule    Sig: TAKE 1 CAPSULE BY MOUTH EVERY DAY FOR 2 WEEKS EVERY THIRD MONTH: JANUARY, APRIL, JULY AND OCTOBER    Dispense:  14 capsule    Refill:  3    Order Specific Question:   Supervising Provider    Answer:   Elonda Husky, LUTHER H [[id number]]     5. Hot flashes Will increase estrace to 2 mg     Plan:     Follow up in 1 year

## 2020-07-05 LAB — CYTOLOGY - PAP
Adequacy: ABSENT
Chlamydia: NEGATIVE
Comment: NEGATIVE
Comment: NEGATIVE
Comment: NEGATIVE
Comment: NORMAL
HPV 16: NEGATIVE
HPV 18 / 45: NEGATIVE
High risk HPV: POSITIVE — AB
Neisseria Gonorrhea: NEGATIVE

## 2020-07-08 ENCOUNTER — Encounter: Payer: Self-pay | Admitting: Adult Health

## 2020-07-08 ENCOUNTER — Telehealth: Payer: Self-pay | Admitting: Adult Health

## 2020-07-08 DIAGNOSIS — Z8742 Personal history of other diseases of the female genital tract: Secondary | ICD-10-CM | POA: Insufficient documentation

## 2020-07-08 DIAGNOSIS — R87612 Low grade squamous intraepithelial lesion on cytologic smear of cervix (LGSIL): Secondary | ICD-10-CM

## 2020-07-08 HISTORY — DX: Low grade squamous intraepithelial lesion on cytologic smear of cervix (LGSIL): R87.612

## 2020-07-08 NOTE — Telephone Encounter (Signed)
Pt aware that pap was LSIL with +HPV, will get colpo per [facility]

## 2020-07-09 ENCOUNTER — Other Ambulatory Visit: Payer: Self-pay | Admitting: Family Medicine

## 2020-07-19 ENCOUNTER — Other Ambulatory Visit: Payer: Self-pay | Admitting: *Deleted

## 2020-07-19 MED ORDER — MEGESTROL ACETATE 20 MG PO TABS: ORAL_TABLET | ORAL | 2 refills | Status: DC

## 2020-07-25 ENCOUNTER — Other Ambulatory Visit (HOSPITAL_COMMUNITY)
Admission: RE | Admit: 2020-07-25 | Discharge: 2020-07-25 | Disposition: A | Discharge status: Home / Self Care | Payer: Medicare HMO | Source: Ambulatory Visit | Attending: Obstetrics & Gynecology | Admitting: Obstetrics & Gynecology

## 2020-07-25 ENCOUNTER — Encounter: Payer: Self-pay | Admitting: Women's Health

## 2020-07-25 ENCOUNTER — Ambulatory Visit (INDEPENDENT_AMBULATORY_CARE_PROVIDER_SITE_OTHER): Payer: Medicare HMO | Admitting: Women's Health

## 2020-07-25 ENCOUNTER — Other Ambulatory Visit: Payer: Self-pay

## 2020-07-25 VITALS — BP 115/62 | HR 79 | Wt 167.0 lb

## 2020-07-25 DIAGNOSIS — R87612 Low grade squamous intraepithelial lesion on cytologic smear of cervix (LGSIL): Secondary | ICD-10-CM | POA: Insufficient documentation

## 2020-07-25 DIAGNOSIS — N870 Mild cervical dysplasia: Secondary | ICD-10-CM | POA: Diagnosis not present

## 2020-07-25 DIAGNOSIS — Z8742 Personal history of other diseases of the female genital tract: Secondary | ICD-10-CM | POA: Diagnosis not present

## 2020-07-25 NOTE — Progress Notes (Signed)
   COLPOSCOPY PROCEDURE NOTE Patient name: Tracey Daniel MRN [id number]  Date of birth: Oct 28, 1980 Subjective Findings:   Tracey Daniel is a 40 y.o. G0P0 African American female being seen today for a colposcopy. Indication: Abnormal pap on 07/01/20: LSIL w/ HRHPV positive: other (not 16, 18/45)  Prior cytology: does have h/o laser ablation of cervix 2005 for moderate cervical dysplasia Date Result Procedure  03/07/19 ASCUS w/ HRHPV positive: other (not 16, 18/45) None  05/25/17 NILM w/ HRHPV positive: type not specified None  2016 NILM w/ HRHPV positive: type not specified None  2013 NILM w/ HRHPV not done None  No LMP recorded (lmp unknown). Patient is postmenopausal. Contraception: post menopausal status (early) Menopausal: yes @ 40yo . Hysterectomy: no.   Smoker: yes. Immunocompromised: no.   The risks and benefits were explained and informed consent was obtained, and written copy is in chart. Pertinent History Reviewed:   Reviewed past medical,surgical, social, obstetrical and family history.  Reviewed problem list, medications and allergies. Objective Findings & Procedure:   Vitals:   07/25/20 0856  BP: 115/62  Pulse: 79  Weight: 167 lb (75.8 kg)  Body mass index is 26.95 kg/m.  No results found for this or any previous visit (from the past 24 hour(s)).   Time out was performed.  Speculum placed in the vagina, cervix fully visualized. SCJ: not fully visualized. Cervix swabbed x 3 with acetic acid.  Acetowhitening present: Yes Cervix: very flat/not bulbous, os very small, ACW changes around circumference of os, no mosaicism/punctation/abnormal vessels. Endocervical curettage performed and Cervical biopsies taken at 12 o'clock & very tiny sample at 6 o'clock (pt in a lot of pain, and asked me to stop). Vagina: vaginal colposcopy not performed Vulva: vulvar colposcopy not performed  Specimens: 3  Complications: none  Chaperone: Alice Rieger    Colposcopic  Impression & Plan:   Colposcopy findings consistent with LSIL Plan: Post biopsy instructions given, Will notify patient of results when back, and Will base plan of care on pathology results and [facility] guidelines  Return in about 1 year (around 07/25/2021) for Pap & physical.  Roma Schanz CNM, WHNP-BC 07/25/2020 9:40 AM

## 2020-07-25 NOTE — Patient Instructions (Signed)
[email or web address]  Colposcopy, Care After This sheet gives you information about how to care for yourself after your procedure. Your health care provider may also give you more specific instructions. If you have problems or questions, contact your health careprovider. What can I expect after the procedure? If you had a colposcopy without a biopsy, you can expect to feel fine right away after your procedure. However, you may have some spotting of blood for afew days. You can return to your normal activities. If you had a colposcopy with a biopsy, it is common after the procedure to have: Soreness and mild pain. These may last for a few days. Light-headedness. Mild vaginal bleeding or discharge that is dark-colored and grainy. This may last for a few days. The discharge may be caused by a liquid (solution) that was used during the procedure. You may need to wear a sanitary pad during this time. Spotting of blood for at least 48 hours after the procedure. Follow these instructions at home: Medicines Take over-the-counter and prescription medicines only as told by your health care provider. Talk with your health care provider about what type of over-the-counter pain medicine and prescription medicine you can start to take again. It is especially important to talk with your health care provider if you take blood thinners. Activity Limit your physical activity for the first day after your procedure as told by your health care provider. Avoid using douche products, using tampons, or having sex for at least 3 days after the procedure or for as long as told. Return to your normal activities as told by your health care provider. Ask your health care provider what activities are safe for you. General instructions  Drink enough fluid to keep your urine pale yellow. Ask your health care provider if you may take baths, swim, or use a hot tub. You may take showers. If you use  birth control (contraception), continue to use it. Keep all follow-up visits as told by your health care provider. This is important.  Contact a health care provider if: You develop a skin rash. Get help right away if: You bleed a lot from your vagina or pass blood clots. This includes using more than one sanitary pad each hour for 2 hours in a row. You have a fever or chills. You have vaginal discharge that is abnormal, is yellow in color, or smells bad. This could be a sign of infection. You have severe pain or cramps in your lower abdomen that do not go away with medicine. You faint. Summary If you had a colposcopy without a biopsy, you can expect to feel fine right away, but you may have some spotting of blood for a few days. You can return to your normal activities. If you had a colposcopy with a biopsy, it is common to have mild pain for a few days and spotting for 48 hours after the procedure. Avoid using douche products, using tampons, and having sex for at least 3 days after the procedure or for as long as told by your health care provider. Get help right away if you have heavy bleeding, severe pain, or signs of infection. This information is not intended to replace advice given to you by your health care provider. Make sure you discuss any questions you have with your healthcare provider. Document Revised: 01/04/2019 Document Reviewed: 01/04/2019 Elsevier Patient Education  2022 [employer]  

## 2020-07-25 NOTE — Addendum Note (Signed)
Addended by: Octaviano Glow on: 07/25/2020 12:15 PM   Modules accepted: Orders

## 2020-07-26 LAB — SURGICAL PATHOLOGY

## 2020-07-30 ENCOUNTER — Other Ambulatory Visit: Payer: Self-pay

## 2020-07-30 ENCOUNTER — Ambulatory Visit
Admission: RE | Admit: 2020-07-30 | Discharge: 2020-07-30 | Disposition: A | Discharge status: Home / Self Care | Payer: Medicare HMO | Source: Ambulatory Visit | Attending: Family Medicine | Admitting: Family Medicine

## 2020-07-30 DIAGNOSIS — Z1231 Encounter for screening mammogram for malignant neoplasm of breast: Secondary | ICD-10-CM | POA: Diagnosis not present

## 2020-07-31 DIAGNOSIS — M791 Myalgia, unspecified site: Secondary | ICD-10-CM | POA: Diagnosis not present

## 2020-07-31 DIAGNOSIS — G43719 Chronic migraine without aura, intractable, without status migrainosus: Secondary | ICD-10-CM | POA: Diagnosis not present

## 2020-07-31 DIAGNOSIS — G518 Other disorders of facial nerve: Secondary | ICD-10-CM | POA: Diagnosis not present

## 2020-07-31 DIAGNOSIS — M542 Cervicalgia: Secondary | ICD-10-CM | POA: Diagnosis not present

## 2020-08-10 ENCOUNTER — Encounter (HOSPITAL_COMMUNITY): Payer: Self-pay | Admitting: Oncology

## 2020-08-14 ENCOUNTER — Other Ambulatory Visit: Payer: Self-pay

## 2020-08-14 ENCOUNTER — Ambulatory Visit (INDEPENDENT_AMBULATORY_CARE_PROVIDER_SITE_OTHER): Payer: Medicare HMO | Admitting: Dermatology

## 2020-08-14 DIAGNOSIS — L920 Granuloma annulare: Secondary | ICD-10-CM

## 2020-08-14 DIAGNOSIS — D869 Sarcoidosis, unspecified: Secondary | ICD-10-CM

## 2020-08-14 DIAGNOSIS — L669 Cicatricial alopecia, unspecified: Secondary | ICD-10-CM | POA: Diagnosis not present

## 2020-08-14 MED ORDER — CLOBETASOL PROPIONATE 0.05 % EX FOAM: Freq: Two times a day (BID) | CUTANEOUS | 1 refills | Status: DC

## 2020-08-14 NOTE — Progress Notes (Signed)
   Follow-Up Visit   Subjective  Tracey Daniel is a 40 y.o. female who presents for the following: Alopecia (Here for hair loss. X year ago is when it started falling out. No treatments per patient. Patient does get injections in scalp for migraine headaches. She is not sure what is causing it. She does think he hormones might play a role cause she is having hot flashes. She is on hormones. )   Hair loss plus new bump left eyelid.  Location:  Duration:  Quality:  Associated Signs/Symptoms: Modifying Factors:  Severity:  Timing: Context:   Objective  Well appearing patient in no apparent distress; mood and affect are within normal limits.   A focused examination was performed including head, neck, lymph nodes, lacrimal and parotid glands, arms, legs.. Relevant physical exam findings are noted in the Assessment and Plan.   Assessment & Plan      Granuloma annulare on the arms and legs is not improved so I suggested Iesha discontinue her hydroxychloroquine (Plaquenil) for the next 10 weeks.  If there is worsening I will okay her restarting this.  If there is no worsening we will consider dapsone or methotrexate.  She has a new flesh-colored papule on the left upper eyelid which would better fits sarcoidosis then granuloma annulare.  If this is still there in 10 weeks we will schedule enough time to take a biopsy.  Additionally there is an area on the back crown of hair loss with a history of some inflammation and itching.  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  She will initially use clobetasol daily on this area (she may also use this on the arms and legs but is never to put this on the face or eyelid area).  If the hair loss problem and the granuloma annulare are improving she may choose to cancel her follow-up, otherwise we will see her in 10 weeks.    I, Lavonna Monarch, MD, have reviewed all documentation for this visit.  The documentation  on 08/14/20 for the exam, diagnosis, procedures, and orders are all accurate and complete.

## 2020-08-20 ENCOUNTER — Telehealth: Payer: Self-pay | Admitting: Women's Health

## 2020-08-20 NOTE — Telephone Encounter (Signed)
Returned pt's call for clarification. Pt stated that she noticed the spotting when she went to the bathroom this morning. She feels as though it is getting heavier. She denies any cramping or pain. She was advised to see emergent care if she had severe pain or bleeding heavy enough to fill a pad in a hour. Pt informed her msg would be forwarded to a provider for advice. Pt confirmed understanding. Pt's pharmacy is [facility] on [address].

## 2020-08-20 NOTE — Telephone Encounter (Signed)
Called pt to relay Tracey Daniel's advice regarding making an appt. Pt was transferred to front to schedule. Pt confirmed understanding.

## 2020-08-20 NOTE — Telephone Encounter (Signed)
Patient calling stating that she is bleeding hasn't had a period since she was 45 and had colpo back on 07/25/20 with Gertie Exon wants to know if it is normal to. Started this morning and is filling up a pad

## 2020-08-22 ENCOUNTER — Encounter: Payer: Self-pay | Admitting: Obstetrics & Gynecology

## 2020-08-22 ENCOUNTER — Ambulatory Visit: Payer: Medicare HMO | Admitting: Obstetrics & Gynecology

## 2020-08-22 ENCOUNTER — Other Ambulatory Visit: Payer: Self-pay

## 2020-08-22 ENCOUNTER — Other Ambulatory Visit (HOSPITAL_COMMUNITY)
Admission: RE | Admit: 2020-08-22 | Discharge: 2020-08-22 | Disposition: A | Discharge status: Home / Self Care | Payer: Medicare HMO | Source: Ambulatory Visit | Attending: Obstetrics & Gynecology | Admitting: Obstetrics & Gynecology

## 2020-08-22 VITALS — BP 108/64 | HR 72 | Ht 66.0 in | Wt 164.0 lb

## 2020-08-22 DIAGNOSIS — N898 Other specified noninflammatory disorders of vagina: Secondary | ICD-10-CM | POA: Insufficient documentation

## 2020-08-22 DIAGNOSIS — N950 Postmenopausal bleeding: Secondary | ICD-10-CM

## 2020-08-22 DIAGNOSIS — E2839 Other primary ovarian failure: Secondary | ICD-10-CM

## 2020-08-22 MED ORDER — MEDROXYPROGESTERONE ACETATE 10 MG PO TABS: 10.0000 mg | ORAL_TABLET | Freq: Every day | ORAL | 0 refills | Status: DC

## 2020-08-22 NOTE — Progress Notes (Unsigned)
Chief Complaint  Patient presents with   has a brown vaginal discharge      40 y.o. G0P0 No LMP recorded (lmp unknown). Patient is postmenopausal. The current method of family planning is {contraception:315051}.  Outpatient Encounter Medications as of 08/22/2020  Medication Sig   acetaminophen (TYLENOL) 325 MG tablet Take 2 tablets (650 mg total) by mouth every 6 (six) hours as needed for mild pain (or Fever >/= 101).   azelastine (ASTELIN) 0.1 % nasal spray USE 2 SPRAYS IN EACH NOSTRIL TWICE DAILY AS DIRECTED   budesonide-formoterol (SYMBICORT) 80-4.5 MCG/ACT inhaler Inhale 2 puffs into the lungs 2 (two) times daily.   clobetasol (OLUX) 0.05 % topical foam Apply topically 2 (two) times daily.   clotrimazole-betamethasone (LOTRISONE) cream Apply sparingly to rash on face once daily for 1 week   diclofenac (VOLTAREN) 75 MG EC tablet Take one tablet by mouth once daily for chronic back pain   estradiol (ESTRACE) 0.1 MG/GM vaginal cream INSERT [id number] APPLICATORFUL VAGINALLY THREE TIMES WEEKLY AS DIRECTED   estradiol (ESTRACE) 2 MG tablet Take 1 tablet (2 mg total) by mouth daily.   feeding supplement (ENSURE CLINICAL STRENGTH) LIQD Take 237 mLs by mouth 3 (three) times daily with meals.   fluticasone (FLONASE) 50 MCG/ACT nasal spray SHAKE LIQUID AND USE 2 SPRAYS IN EACH NOSTRIL DAILY   gabapentin (NEURONTIN) 100 MG capsule Take 1 capsule (100 mg total) by mouth 3 (three) times daily.   hydrocortisone 2.5 % ointment    linaclotide (LINZESS) 145 MCG CAPS capsule Take 1 capsule (145 mcg total) by mouth daily before breakfast.   medroxyPROGESTERone (PROVERA) 10 MG tablet Take 1 tablet (10 mg total) by mouth daily.   megestrol (MEGACE) 20 MG tablet TAKE 1 TABLET(20 MG) BY MOUTH DAILY   metoCLOPramide (REGLAN) 10 MG tablet TAKE 1 TABLET(10 MG) BY MOUTH EVERY 8 HOURS AS NEEDED FOR NAUSEA   ondansetron (ZOFRAN-ODT) 4 MG disintegrating tablet Take 1 tablet (4 mg total) by mouth every 8 (eight)  hours as needed for nausea or vomiting.   pantoprazole (PROTONIX) 40 MG tablet Take 1 tablet (40 mg total) by mouth 2 (two) times daily before a meal.   potassium chloride SA (KLOR-CON) 20 MEQ tablet TAKE 1 TABLET BY MOUTH EVERY DAY   prazosin (MINIPRESS) 1 MG capsule    progesterone (PROMETRIUM) 200 MG capsule TAKE 1 CAPSULE BY MOUTH EVERY DAY FOR 2 WEEKS EVERY THIRD MONTH: JANUARY, APRIL, JULY AND OCTOBER   propranolol (INDERAL) 10 MG tablet Take 1 tablet (10 mg total) by mouth 3 (three) times daily.   risperiDONE (RISPERDAL) 0.5 MG tablet Take 0.5 mg at bedtime by mouth.   rosuvastatin (CRESTOR) 20 MG tablet Take 1 tablet (20 mg total) by mouth daily.   sertraline (ZOLOFT) 50 MG tablet Take 50 mg by mouth at bedtime.    sucralfate (CARAFATE) 1 g tablet One tablet every morning and at bedtime for stomach burning.   tiZANidine (ZANAFLEX) 4 MG tablet TAKE 1 TABLET(4 MG) BY MOUTH BACK TWICE DAILY AS NEEDED FOR SPASMS   VENTOLIN HFA 108 (90 Base) MCG/ACT inhaler INHALE 2 PUFFS INTO THE LUNGS EVERY 6 HOURS AS NEEDED FOR SHORTNESS OF BREATH   Vitamin D, Ergocalciferol, (DRISDOL) 1.25 MG (50000 UNIT) CAPS capsule TAKE 1 CAPSULE BY MOUTH 1 TIME A WEEK   zonisamide (ZONEGRAN) 50 MG capsule Take 150 mg by mouth at bedtime.    [DISCONTINUED] hydroxychloroquine (PLAQUENIL) 200 MG tablet Take 200 mg by mouth  daily.   No facility-administered encounter medications on file as of 08/22/2020.    Subjective *** Past Medical History:  Diagnosis Date   Anemia of other chronic disease 11/29/2012   Asthma    Back pain, chronic    Chronic abdominal pain    Constipation    COPD (chronic obstructive pulmonary disease) ([place])    Cyst of skin    mid spine area   Depression 2000   h/o suicidal ideation   Gastric outlet obstruction    Gastritis    Gastroparesis    LGSIL of cervix of undetermined significance 07/08/2020   07/08/20 pt aware will get colpo, per [facility]   Migraine    Migraines    Nausea and  vomiting    recurrent   Nicotine addiction    Osteoporosis    Peptic ulcer disease    Pyloric spasm 03/30/2011   Seasonal allergies    Sinusitis    Substance abuse ([place]) 2008   marijuana   Vitamin D deficiency     Past Surgical History:  Procedure Laterality Date   BALLOON DILATION N/A 02/09/2013   Procedure: BALLOON DILATION;  Surgeon: Missy Sabins, MD;  Location: [facility];  Service: Endoscopy;  Laterality: N/A;   BALLOON DILATION N/A 03/10/2018   Procedure: BALLOON DILATION;  Surgeon: Daneil Dolin, MD;  [facility];  Service: Endoscopy;  Laterality: N/A;   BILIARY DILATION  06/01/2018   Procedure: PYLORIC DILATION;  Surgeon: Rush Landmark Telford Nab., MD;  Location: [place];  Service: Gastroenterology;;   BIOPSY  06/01/2018   Procedure: BIOPSY;  Surgeon: Irving Copas., MD;  Location: [facility];  Service: Gastroenterology;;   BOTOX INJECTION N/A 02/09/2013   Procedure: BOTOX INJECTION;  Surgeon: Missy Sabins, MD;  Location: [facility];  Service: Endoscopy;  Laterality: N/A;  possible balloon   BOTOX INJECTION N/A 10/24/2015   Procedure: BOTOX INJECTION;  Surgeon: Daneil Dolin, MD;  [facility];  Service: Endoscopy;  Laterality: N/A;   BOTOX INJECTION N/A 03/10/2018   Procedure: BOTOX INJECTION;  Surgeon: Daneil Dolin, MD;  [facility];  Service: Endoscopy;  Laterality: N/A;  Melanie notified   BOTOX INJECTION  06/01/2018   Procedure: BOTOX INJECTION;  Surgeon: Rush Landmark Telford Nab., MD;  Location: [place];  Service: Gastroenterology;;   CARPAL TUNNEL RELEASE     left hand   COLONOSCOPY WITH PROPOFOL N/A 09/20/2014   RMR: Normal ileo-colonoscopy   ESOPHAGEAL DILATION  12/25/2010   Procedure: ESOPHAGEAL DILATION;  Surgeon: Dorothyann Peng, MD;  [facility];  Service: Endoscopy;;   ESOPHAGEAL MANOMETRY N/A 10/03/2018   Procedure: ESOPHAGEAL MANOMETRY (EM);  Surgeon: Mauri Pole, MD;  Location: [facility];  Service: Endoscopy;  Laterality: N/A;   ESOPHAGOGASTRODUODENOSCOPY  12/25/2010   Dorothyann Peng, MD;  moderate gastritis, ?goo secondary to pylorspasm. BX showed reactive gstropathy no h.pyori, SB mucosa with intramucosal lymphocytosis and partial villous blunting (TTG 4.0 normal)   ESOPHAGOGASTRODUODENOSCOPY N/A 11/14/2012   [id number] hiatal hernia. Abnormal gastric mucosa of  uncertain significance-status post biopsy. Subjectively, patient may have recurrent symptomatic, pylorospasm.   ESOPHAGOGASTRODUODENOSCOPY (EGD) WITH PROPOFOL N/A 02/09/2013   elongated stomach, partial lower esophageal ring widely patent. No obvious pyloric stenosis s/p Botox   ESOPHAGOGASTRODUODENOSCOPY (EGD) WITH PROPOFOL N/A 10/24/2015   Procedure: ESOPHAGOGASTRODUODENOSCOPY (EGD) WITH PROPOFOL;  Surgeon: Daneil Dolin, MD;  [facility];  Service: Endoscopy;  Laterality: N/A;  830    ESOPHAGOGASTRODUODENOSCOPY (  EGD) WITH PROPOFOL N/A 03/10/2018   Dr. Gala Romney: normal esophagus, elongated, dilated stomach likely stigmata of slow gastric emptying due to narrowing of pyloric channel, s/p balloon dilatation of pyloric channel. Small hiatal hernia, normal duodenal bulb and second portion of duodenum   ESOPHAGOGASTRODUODENOSCOPY (EGD) WITH PROPOFOL N/A 06/01/2018   Procedure: ESOPHAGOGASTRODUODENOSCOPY (EGD) WITH PROPOFOL;  Surgeon: Rush Landmark Telford Nab., MD;  Location: [place];  Service: Gastroenterology;  Laterality: N/A;   LAPAROSCOPIC CHOLECYSTECTOMY  2002   [facility]?  [place]?   laser surgery on cervix     NASAL SEPTUM SURGERY  01/29/2017   TOE SURGERY      OB History     Gravida  0   Para      Term      Preterm      AB      Living         SAB      IAB      Ectopic      Multiple      Live Births              Allergies  Allergen Reactions   Benadryl [Diphenhydramine] Anaphylaxis   Penicillins Other (See Comments)    Unknown Has patient had a PCN reaction  causing immediate rash, facial/tongue/throat swelling, SOB or lightheadedness with hypotension Yes Has patient had a PCN reaction causing severe rash involving mucus membranes or skin necrosis: Yes-hives  Has patient had a PCN reaction that required hospitalization Yes Has patient had a PCN reaction occurring within the last 10 years: Yes If all of the above answers are "NO", then may proceed with Cephalosporin use.    Latex Itching and Other (See Comments)    "burning" where the tape goes    Social History   Socioeconomic History   Marital status: Significant Other    Spouse name: Not on file   Number of children: 0   Years of education: 9   Highest education level: 9th grade  Occupational History   Occupation: disability    Employer: UNEMPLOYED  Tobacco Use   Smoking status: Every Day    Packs/day: 0.10    Years: 15.00    Pack years: 1.50    Types: Cigarettes   Smokeless tobacco: Never   Tobacco comments:    2 per day  Vaping Use   Vaping Use: Never used  Substance and Sexual Activity   Alcohol use: No    Alcohol/week: 0.0 standard drinks   Drug use: Yes    Frequency: 2.0 times per week    Types: Marijuana    Comment: trying to quit   Sexual activity: Yes    Birth control/protection: Post-menopausal  Other Topics Concern   Not on file  Social History Narrative   Not on file   Social Determinants of Health   Financial Resource Strain: Low Risk    Difficulty of Paying Living Expenses: Not very hard  Food Insecurity: Food Insecurity Present   Worried About Charity fundraiser in the Last Year: Often true   Arboriculturist in the Last Year: Sometimes true  Transportation Needs: No Transportation Needs   Lack of Transportation (Medical): No   Lack of Transportation (Non-Medical): No  Physical Activity: Insufficiently Active   Days of Exercise per Week: 2 days   Minutes of Exercise per Session: 20 min  Stress: No Stress Concern Present   Feeling of Stress : Not  at all  Social Connections: Moderately Isolated  Frequency of Communication with Friends and Family: Twice a week   Frequency of Social Gatherings with Friends and Family: Once a week   Attends Religious Services: Never   Marine scientist or Organizations: No   Attends Music therapist: Never   Marital Status: Living with partner    Family History  Problem Relation Age of Onset   Diabetes Father    Liver disease Father        liver transplant at [facility], age 58   Lung cancer Mother 65   Heart disease Maternal Grandmother    Parkinson's disease Maternal Grandfather    Multiple sclerosis Sister 25   Depression Sister 1   Alcohol abuse Sister    Bipolar disorder Sister    Schizophrenia Sister    Colon cancer Neg Hx     Medications:       Current Outpatient Medications:    acetaminophen (TYLENOL) 325 MG tablet, Take 2 tablets (650 mg total) by mouth every 6 (six) hours as needed for mild pain (or Fever >/= 101)., Disp: 20 tablet, Rfl: 0   azelastine (ASTELIN) 0.1 % nasal spray, USE 2 SPRAYS IN EACH NOSTRIL TWICE DAILY AS DIRECTED, Disp: 30 mL, Rfl: 2   budesonide-formoterol (SYMBICORT) 80-4.5 MCG/ACT inhaler, Inhale 2 puffs into the lungs 2 (two) times daily., Disp: 1 Inhaler, Rfl: 3   clobetasol (OLUX) 0.05 % topical foam, Apply topically 2 (two) times daily., Disp: 50 g, Rfl: 1   clotrimazole-betamethasone (LOTRISONE) cream, Apply sparingly to rash on face once daily for 1 week, Disp: 15 g, Rfl: 0   diclofenac (VOLTAREN) 75 MG EC tablet, Take one tablet by mouth once daily for chronic back pain, Disp: 90 tablet, Rfl: 1   estradiol (ESTRACE) 0.1 MG/GM vaginal cream, INSERT [id number] APPLICATORFUL VAGINALLY THREE TIMES WEEKLY AS DIRECTED, Disp: 42.5 g, Rfl: 4   estradiol (ESTRACE) 2 MG tablet, Take 1 tablet (2 mg total) by mouth daily., Disp: 30 tablet, Rfl: 11   feeding supplement (ENSURE CLINICAL STRENGTH) LIQD, Take 237 mLs by mouth 3 (three) times daily with meals.,  Disp: 10 Bottle, Rfl: 3   fluticasone (FLONASE) 50 MCG/ACT nasal spray, SHAKE LIQUID AND USE 2 SPRAYS IN EACH NOSTRIL DAILY, Disp: 48 g, Rfl: 2   gabapentin (NEURONTIN) 100 MG capsule, Take 1 capsule (100 mg total) by mouth 3 (three) times daily., Disp: 90 capsule, Rfl: 5   hydrocortisone 2.5 % ointment, , Disp: , Rfl:    linaclotide (LINZESS) 145 MCG CAPS capsule, Take 1 capsule (145 mcg total) by mouth daily before breakfast., Disp: 30 capsule, Rfl: 5   medroxyPROGESTERone (PROVERA) 10 MG tablet, Take 1 tablet (10 mg total) by mouth daily., Disp: 10 tablet, Rfl: 0   megestrol (MEGACE) 20 MG tablet, TAKE 1 TABLET(20 MG) BY MOUTH DAILY, Disp: 30 tablet, Rfl: 2   metoCLOPramide (REGLAN) 10 MG tablet, TAKE 1 TABLET(10 MG) BY MOUTH EVERY 8 HOURS AS NEEDED FOR NAUSEA, Disp: 90 tablet, Rfl: 2   ondansetron (ZOFRAN-ODT) 4 MG disintegrating tablet, Take 1 tablet (4 mg total) by mouth every 8 (eight) hours as needed for nausea or vomiting., Disp: 20 tablet, Rfl: 0   pantoprazole (PROTONIX) 40 MG tablet, Take 1 tablet (40 mg total) by mouth 2 (two) times daily before a meal., Disp: 180 tablet, Rfl: 3   potassium chloride SA (KLOR-CON) 20 MEQ tablet, TAKE 1 TABLET BY MOUTH EVERY DAY, Disp: 90 tablet, Rfl: 3   prazosin (MINIPRESS) 1 MG capsule, ,  Disp: , Rfl:    progesterone (PROMETRIUM) 200 MG capsule, TAKE 1 CAPSULE BY MOUTH EVERY DAY FOR 2 WEEKS EVERY THIRD MONTH: JANUARY, APRIL, JULY AND OCTOBER, Disp: 14 capsule, Rfl: 3   propranolol (INDERAL) 10 MG tablet, Take 1 tablet (10 mg total) by mouth 3 (three) times daily., Disp: 90 tablet, Rfl: 2   risperiDONE (RISPERDAL) 0.5 MG tablet, Take 0.5 mg at bedtime by mouth., Disp: , Rfl:    rosuvastatin (CRESTOR) 20 MG tablet, Take 1 tablet (20 mg total) by mouth daily., Disp: 30 tablet, Rfl: 5   sertraline (ZOLOFT) 50 MG tablet, Take 50 mg by mouth at bedtime. , Disp: , Rfl:    sucralfate (CARAFATE) 1 g tablet, One tablet every morning and at bedtime for stomach  burning., Disp: 180 tablet, Rfl: 3   tiZANidine (ZANAFLEX) 4 MG tablet, TAKE 1 TABLET(4 MG) BY MOUTH BACK TWICE DAILY AS NEEDED FOR SPASMS, Disp: 180 tablet, Rfl: 1   VENTOLIN HFA 108 (90 Base) MCG/ACT inhaler, INHALE 2 PUFFS INTO THE LUNGS EVERY 6 HOURS AS NEEDED FOR SHORTNESS OF BREATH, Disp: 18 g, Rfl: 0   Vitamin D, Ergocalciferol, (DRISDOL) 1.25 MG (50000 UNIT) CAPS capsule, TAKE 1 CAPSULE BY MOUTH 1 TIME A WEEK, Disp: 12 capsule, Rfl: 1   zonisamide (ZONEGRAN) 50 MG capsule, Take 150 mg by mouth at bedtime. , Disp: , Rfl: 2  Objective Blood pressure 108/64, pulse 72, height '5\' 6"'$  (1.676 m), weight 164 lb (74.4 kg).  ***  Pertinent ROS ***  Labs or studies ***    Impression Diagnoses this Encounter::   ICD-10-CM   1. PMB (postmenopausal bleeding)  N95.0     2. Vaginal discharge  N89.8 Cervicovaginal ancillary only( [place])    3. Premature ovarian failure  E28.39       Established relevant diagnosis(es): ***  Plan/Recommendations: Meds ordered this encounter  Medications   medroxyPROGESTERone (PROVERA) 10 MG tablet    Sig: Take 1 tablet (10 mg total) by mouth daily.    Dispense:  10 tablet    Refill:  0    Labs or Scans Ordered: No orders of the defined types were placed in this encounter.   Management:: ***  Follow up Return for GYN sono, Follow up, with Dr Elonda Husky.     [id number]: 10 min     [id number]: 15 min      [id number]:25 min}   Face to face time:  *** minutes  Greater than 50% of the visit time was spent in counseling and coordination of care with the patient.  The summary and outline of the counseling and care coordination is summarized in the note above.   All questions were answered.

## 2020-08-23 ENCOUNTER — Other Ambulatory Visit: Payer: Self-pay | Admitting: Family Medicine

## 2020-08-23 LAB — CERVICOVAGINAL ANCILLARY ONLY
Bacterial Vaginitis (gardnerella): NEGATIVE
Candida Glabrata: NEGATIVE
Candida Vaginitis: NEGATIVE
Chlamydia: NEGATIVE
Comment: NEGATIVE
Comment: NEGATIVE
Comment: NEGATIVE
Comment: NEGATIVE
Comment: NEGATIVE
Comment: NORMAL
Neisseria Gonorrhea: NEGATIVE
Trichomonas: NEGATIVE

## 2020-08-31 ENCOUNTER — Encounter: Payer: Self-pay | Admitting: Dermatology

## 2020-09-11 DIAGNOSIS — M542 Cervicalgia: Secondary | ICD-10-CM | POA: Diagnosis not present

## 2020-09-11 DIAGNOSIS — M791 Myalgia, unspecified site: Secondary | ICD-10-CM | POA: Diagnosis not present

## 2020-09-11 DIAGNOSIS — G518 Other disorders of facial nerve: Secondary | ICD-10-CM | POA: Diagnosis not present

## 2020-09-11 DIAGNOSIS — G43719 Chronic migraine without aura, intractable, without status migrainosus: Secondary | ICD-10-CM | POA: Diagnosis not present

## 2020-09-16 ENCOUNTER — Ambulatory Visit: Payer: Medicare HMO | Admitting: Obstetrics & Gynecology

## 2020-09-16 ENCOUNTER — Other Ambulatory Visit: Payer: Self-pay | Admitting: Obstetrics & Gynecology

## 2020-09-16 DIAGNOSIS — N950 Postmenopausal bleeding: Secondary | ICD-10-CM

## 2020-09-17 ENCOUNTER — Ambulatory Visit: Payer: Medicare HMO | Admitting: Obstetrics & Gynecology

## 2020-09-17 ENCOUNTER — Other Ambulatory Visit: Payer: Medicare HMO

## 2020-09-23 ENCOUNTER — Other Ambulatory Visit: Payer: Self-pay | Admitting: Family Medicine

## 2020-09-23 ENCOUNTER — Encounter: Payer: Self-pay | Admitting: Gastroenterology

## 2020-09-23 NOTE — Progress Notes (Signed)
Referring Provider: [name], MD Primary Care Physician:  [name], MD Primary GI Physician: Dr. Gala Romney  Chief Complaint  Patient presents with   Gastroesophageal Reflux    Doing ok     HPI:   Tracey Daniel is a 40 y.o. female presenting today for follow-up. History of GERD, gastroparesis, gastric outlet obstruction secondary to recurrent pylorospasm/pyloric stenosis requiring repeat EGDs with dilations/Botox injections with last in 2020, and constipation.   GES in 2012 abnormal. Repeat GES July 2020 ordered by general surgeon Michael Boston, MD was normal.  Dr. Johney Maine recommended esophageal manometry to rule out any esophageal dysmotility or other issues that may explain nausea/retching.  Hesitant to pursue additional intervention such as hiatal hernia repair with fundoplication and pyloroplasty without making sure all other etiologies/options have been exhausted.  Esophageal manometry September 2020 normal.  Last seen in our office 11/01/2019.  GERD well controlled on Protonix 40 mg twice daily.  Continued on Reglan which was working well for her without adverse side effects.  Insurance not covering Zofran.  Constipation well managed with Linzess 145 mcg daily.  Recommended continuing current medications and following up in 6 months.  Today:  GERD: Doing well on Protonix 40 mg twice daily.  Only taking Carafate as needed of stomach burning. This works well. No dysphagia. No abdominal pain.   Gastroparesis: Using Reglan 10 mg twice a day to prevent nausea or vomiting and is doing very well with this. No side effects to this.  Drinking Ensure daily. Was drinking 2. Decreased to 1 at the end of last year.  Eliminated sweets and sodas.  Also avoids fried/fatty foods.  No longer eating potato chips.  In general, eats about 2 small meals daily, at baseline.   Constipation: On Linzess 145 mcg daily. Well controlled. No blood in the stool or black stool.   Past Medical  History:  Diagnosis Date   Alopecia    Anemia of other chronic disease 11/29/2012   Asthma    Back pain, chronic    Chronic abdominal pain    Constipation    COPD (chronic obstructive pulmonary disease) (HCC)    Cyst of skin    mid spine area   Depression 2000   h/o suicidal ideation   Gastric outlet obstruction    Gastritis    Gastroparesis    LGSIL of cervix of undetermined significance 07/08/2020   07/08/20 pt aware will get colpo, per [facility]   Migraine    Migraines    Nausea and vomiting    recurrent   Nicotine addiction    Osteoporosis    Peptic ulcer disease    Pyloric spasm 03/30/2011   Seasonal allergies    Sinusitis    Substance abuse ([place]) 2008   marijuana   Vitamin D deficiency     Past Surgical History:  Procedure Laterality Date   BALLOON DILATION N/A 02/09/2013   Procedure: BALLOON DILATION;  Surgeon: Missy Sabins, MD;  Location: [facility];  Service: Endoscopy;  Laterality: N/A;   BALLOON DILATION N/A 03/10/2018   Procedure: BALLOON DILATION;  Surgeon: Daneil Dolin, MD;  [facility];  Service: Endoscopy;  Laterality: N/A;   BILIARY DILATION  06/01/2018   Procedure: PYLORIC DILATION;  Surgeon: Rush Landmark Telford Nab., MD;  Location: [place];  Service: Gastroenterology;;   BIOPSY  06/01/2018   Procedure: BIOPSY;  Surgeon: Irving Copas., MD;  Location: [place];  Service: Gastroenterology;;   BOTOX INJECTION  N/A 02/09/2013   Procedure: BOTOX INJECTION;  Surgeon: Missy Sabins, MD;  Location: [facility];  Service: Endoscopy;  Laterality: N/A;  possible balloon   BOTOX INJECTION N/A 10/24/2015   Procedure: BOTOX INJECTION;  Surgeon: Daneil Dolin, MD;  [facility];  Service: Endoscopy;  Laterality: N/A;   BOTOX INJECTION N/A 03/10/2018   Procedure: BOTOX INJECTION;  Surgeon: Daneil Dolin, MD;  [facility];  Service: Endoscopy;  Laterality: N/A;  Melanie notified   BOTOX INJECTION  06/01/2018    Procedure: BOTOX INJECTION;  Surgeon: Rush Landmark Telford Nab., MD;  Location: [place];  Service: Gastroenterology;;   CARPAL TUNNEL RELEASE     left hand   COLONOSCOPY WITH PROPOFOL N/A 09/20/2014   RMR: Normal ileo-colonoscopy   ESOPHAGEAL DILATION  12/25/2010   Procedure: ESOPHAGEAL DILATION;  Surgeon: Dorothyann Peng, MD;  [facility];  Service: Endoscopy;;   ESOPHAGEAL MANOMETRY N/A 10/03/2018   Surgeon: Mauri Pole, MD;  Normal.   ESOPHAGOGASTRODUODENOSCOPY  12/25/2010   Dorothyann Peng, MD;  moderate gastritis, ?goo secondary to pylorspasm. BX showed reactive gstropathy no h.pyori, SB mucosa with intramucosal lymphocytosis and partial villous blunting (TTG 4.0 normal)   ESOPHAGOGASTRODUODENOSCOPY N/A 11/14/2012   [id number] hiatal hernia. Abnormal gastric mucosa of  uncertain significance-status post biopsy. Subjectively, patient may have recurrent symptomatic, pylorospasm.   ESOPHAGOGASTRODUODENOSCOPY (EGD) WITH PROPOFOL N/A 02/09/2013   elongated stomach, partial lower esophageal ring widely patent. No obvious pyloric stenosis s/p Botox   ESOPHAGOGASTRODUODENOSCOPY (EGD) WITH PROPOFOL N/A 10/24/2015   Procedure: ESOPHAGOGASTRODUODENOSCOPY (EGD) WITH PROPOFOL;  Surgeon: Daneil Dolin, MD;  [facility];  Service: Endoscopy;  Laterality: N/A;  830    ESOPHAGOGASTRODUODENOSCOPY (EGD) WITH PROPOFOL N/A 03/10/2018   Dr. Gala Romney: normal esophagus, elongated, dilated stomach likely stigmata of slow gastric emptying due to narrowing of pyloric channel, s/p balloon dilatation of pyloric channel. Small hiatal hernia, normal duodenal bulb and second portion of duodenum   ESOPHAGOGASTRODUODENOSCOPY (EGD) WITH PROPOFOL N/A 06/01/2018   Surgeon: Irving Copas., MD; normal esophagus s/p biopsy, small hiatal hernia, erythematous mucosa in the cardia, gastric fundus, gastric body, erosive gastropathy s/p biopsy, normal pylorus s/p dilation and Botox injection,  normal duodenum s/p biopsy.  Surgical pathology all benign.   LAPAROSCOPIC CHOLECYSTECTOMY  2002   [facility]?  [place]?   laser surgery on cervix     NASAL SEPTUM SURGERY  01/29/2017   TOE SURGERY      Current Outpatient Medications  Medication Sig Dispense Refill   acetaminophen (TYLENOL) 325 MG tablet Take 2 tablets (650 mg total) by mouth every 6 (six) hours as needed for mild pain (or Fever >/= 101). 20 tablet 0   azelastine (ASTELIN) 0.1 % nasal spray USE 2 SPRAYS IN EACH NOSTRIL TWICE DAILY AS DIRECTED 30 mL 2   budesonide-formoterol (SYMBICORT) 80-4.5 MCG/ACT inhaler Inhale 2 puffs into the lungs 2 (two) times daily. 1 Inhaler 3   clobetasol (OLUX) 0.05 % topical foam Apply topically 2 (two) times daily. 50 g 1   clotrimazole-betamethasone (LOTRISONE) cream Apply sparingly to rash on face once daily for 1 week 15 g 0   diclofenac (VOLTAREN) 75 MG EC tablet Take one tablet by mouth once daily for chronic back pain 90 tablet 1   estradiol (ESTRACE) 0.1 MG/GM vaginal cream INSERT [id number] APPLICATORFUL VAGINALLY THREE TIMES WEEKLY AS DIRECTED 42.5 g 4   estradiol (ESTRACE) 2 MG tablet Take 1 tablet (2 mg total) by  mouth daily. 30 tablet 11   feeding supplement (ENSURE CLINICAL STRENGTH) LIQD Take 237 mLs by mouth 3 (three) times daily with meals. 10 Bottle 3   fluticasone (FLONASE) 50 MCG/ACT nasal spray SHAKE LIQUID AND USE 2 SPRAYS IN EACH NOSTRIL DAILY 48 g 2   gabapentin (NEURONTIN) 100 MG capsule TAKE 1 CAPSULE(100 MG) BY MOUTH THREE TIMES DAILY 90 capsule 5   linaclotide (LINZESS) 145 MCG CAPS capsule Take 1 capsule (145 mcg total) by mouth daily before breakfast. 30 capsule 5   medroxyPROGESTERone (PROVERA) 10 MG tablet Take 1 tablet (10 mg total) by mouth daily. 10 tablet 0   megestrol (MEGACE) 20 MG tablet TAKE 1 TABLET(20 MG) BY MOUTH DAILY 90 tablet 0   metoCLOPramide (REGLAN) 10 MG tablet TAKE 1 TABLET(10 MG) BY MOUTH EVERY 8 HOURS AS NEEDED FOR NAUSEA 90 tablet 2   ondansetron  (ZOFRAN-ODT) 4 MG disintegrating tablet Take 1 tablet (4 mg total) by mouth every 8 (eight) hours as needed for nausea or vomiting. 20 tablet 0   pantoprazole (PROTONIX) 40 MG tablet Take 1 tablet (40 mg total) by mouth 2 (two) times daily before a meal. 180 tablet 3   potassium chloride SA (KLOR-CON) 20 MEQ tablet TAKE 1 TABLET BY MOUTH EVERY DAY 90 tablet 3   prazosin (MINIPRESS) 1 MG capsule      progesterone (PROMETRIUM) 200 MG capsule TAKE 1 CAPSULE BY MOUTH EVERY DAY FOR 2 WEEKS EVERY THIRD MONTH: JANUARY, APRIL, JULY AND OCTOBER 14 capsule 3   propranolol (INDERAL) 10 MG tablet Take 1 tablet (10 mg total) by mouth 3 (three) times daily. 90 tablet 2   risperiDONE (RISPERDAL) 0.5 MG tablet Take 0.5 mg at bedtime by mouth.     rosuvastatin (CRESTOR) 20 MG tablet Take 1 tablet (20 mg total) by mouth daily. 30 tablet 5   sertraline (ZOLOFT) 50 MG tablet Take 50 mg by mouth at bedtime.      sucralfate (CARAFATE) 1 g tablet One tablet every morning and at bedtime for stomach burning. 180 tablet 3   tiZANidine (ZANAFLEX) 4 MG tablet TAKE 1 TABLET(4 MG) BY MOUTH BACK TWICE DAILY AS NEEDED FOR SPASMS 180 tablet 1   VENTOLIN HFA 108 (90 Base) MCG/ACT inhaler INHALE 2 PUFFS INTO THE LUNGS EVERY 6 HOURS AS NEEDED FOR SHORTNESS OF BREATH 18 g 0   Vitamin D, Ergocalciferol, (DRISDOL) 1.25 MG (50000 UNIT) CAPS capsule TAKE 1 CAPSULE BY MOUTH 1 TIME A WEEK 12 capsule 1   zonisamide (ZONEGRAN) 50 MG capsule Take 150 mg by mouth at bedtime.   2   hydrocortisone 2.5 % ointment  (Patient not taking: Reported on 09/25/2020)     No current facility-administered medications for this visit.    Allergies as of 09/25/2020 - Review Complete 09/25/2020  Allergen Reaction Noted   Benadryl [diphenhydramine] Anaphylaxis 03/25/2014   Penicillins Other (See Comments)    Latex Itching and Other (See Comments) 11/30/2011    Family History  Problem Relation Age of Onset   Diabetes Father    Liver disease Father         liver transplant at [facility], age 49   Lung cancer Mother 80   Heart disease Maternal Grandmother    Parkinson's disease Maternal Grandfather    Multiple sclerosis Sister 3   Depression Sister 53   Alcohol abuse Sister    Bipolar disorder Sister    Schizophrenia Sister    Colon cancer Neg Hx     Social  History   Socioeconomic History   Marital status: Significant Other    Spouse name: Not on file   Number of children: 0   Years of education: 9   Highest education level: 9th grade  Occupational History   Occupation: disability    Employer: UNEMPLOYED  Tobacco Use   Smoking status: Every Day    Packs/day: 0.10    Years: 15.00    Pack years: 1.50    Types: Cigarettes   Smokeless tobacco: Never   Tobacco comments:    2 per day  Vaping Use   Vaping Use: Never used  Substance and Sexual Activity   Alcohol use: No    Alcohol/week: 0.0 standard drinks   Drug use: Yes    Frequency: 2.0 times per week    Types: Marijuana   Sexual activity: Yes    Birth control/protection: Post-menopausal  Other Topics Concern   Not on file  Social History Narrative   Not on file   Social Determinants of Health   Financial Resource Strain: Low Risk    Difficulty of Paying Living Expenses: Not very hard  Food Insecurity: Food Insecurity Present   Worried About Charity fundraiser in the Last Year: Often true   Arboriculturist in the Last Year: Sometimes true  Transportation Needs: No Transportation Needs   Lack of Transportation (Medical): No   Lack of Transportation (Non-Medical): No  Physical Activity: Insufficiently Active   Days of Exercise per Week: 2 days   Minutes of Exercise per Session: 20 min  Stress: No Stress Concern Present   Feeling of Stress : Not at all  Social Connections: Moderately Isolated   Frequency of Communication with Friends and Family: Twice a week   Frequency of Social Gatherings with Friends and Family: Once a week   Attends Religious Services: Never    Marine scientist or Organizations: No   Attends Music therapist: Never   Marital Status: Living with partner    Review of Systems: Gen: Denies fever, chills, cold or flulike symptoms, presyncope, syncope. CV: Denies chest pain, palpitations Resp: Denies dyspnea.  Admits to chronic intermittent cough. GI: See HPI Heme:   Physical Exam: BP 112/71   Pulse 70   Temp (!) 96.8 F (36 C) (Temporal)   Ht '5\' 6"'$  (1.676 m)   Wt 160 lb (72.6 kg)   LMP  (LMP Unknown) Comment: pt reports premature menopause  BMI 25.82 kg/m  General:  Alert and oriented. No distress noted. Pleasant and cooperative.  Head:  Normocephalic and atraumatic. Eyes:  Conjuctiva clear without scleral icterus. Heart:  S1, S2 present without murmurs appreciated. Lungs:  Clear to auscultation bilaterally. No wheezes, rales, or rhonchi. No distress.  Abdomen:  +BS, soft, non-tender and non-distended. No rebound or guarding. No HSM or masses noted. Msk:  Symmetrical without gross deformities. Normal posture. Extremities:  Without edema. Neurologic:  Alert and  oriented x4 Psych: Normal mood and affect.    Assessment: 40 year old female with history of constipation, GERD, gastroparesis, gastric outlet obstruction secondary to recurrent pylorospasm/pyloric stenosis requiring repeat EGDs with dilation/Botox injections with last in 2020 who is presenting today for routine follow-up.  Clinically, she is doing very well with Reglan 10 mg twice daily without side effects, Protonix 40 mg twice daily, Zofran and Carafate as needed.  She denies nausea, vomiting, abdominal pain.  GERD is well controlled.  Per chart review, appears she has lost 8 pounds over the last  3 months, but compared to weight at her last office visit with [place], she has actually gained 4 pounds. Weight loss may be secondary to dietary changes and decreased Ensure use. Continues to eat 2 meals a day at baseline. No alarm symptoms.   We discussed  a trial of Reglan 5 mg twice daily to ensure we are treating her with the lowest effective dose.  She is agreeable to this.  Also reinforced gastroparesis diet and advised to resume Ensure daily to twice daily.  Otherwise, she we will continue her current medications and notify us of additional weight loss or recurrent symptoms prior to her next visit.  Constipation: Well-controlled on Linzess 145 mcg daily.  No alarm symptoms.   Plan: 1.  Trial reducing Reglan to 5 mg twice daily.  2.  Reinforced gastroparesis diet/lifestyle.  Separate handout provided.  3.  Continue Zofran 4 mg to 8 hours as needed.  4.  Continue Protonix 40 mg twice daily.  5.  Continue Carafate as needed.  6.  Continue Ensure daily to twice daily.  7.  Patient is to monitor for persistent unintentional weight loss and let us know if this occurs.  8.  Continue Linzess 145 mcg daily.  9.  Follow-up in 6 months or sooner if needed.    Aliene Altes, PA-C [facility] 09/25/2020

## 2020-09-24 ENCOUNTER — Other Ambulatory Visit: Payer: Self-pay

## 2020-09-25 ENCOUNTER — Other Ambulatory Visit: Payer: Self-pay

## 2020-09-25 ENCOUNTER — Ambulatory Visit (INDEPENDENT_AMBULATORY_CARE_PROVIDER_SITE_OTHER): Payer: Medicare HMO | Admitting: Gastroenterology

## 2020-09-25 ENCOUNTER — Encounter: Payer: Self-pay | Admitting: Gastroenterology

## 2020-09-25 VITALS — BP 112/71 | HR 70 | Temp 96.8°F | Ht 66.0 in | Wt 160.0 lb

## 2020-09-25 DIAGNOSIS — K219 Gastro-esophageal reflux disease without esophagitis: Secondary | ICD-10-CM

## 2020-09-25 DIAGNOSIS — K5900 Constipation, unspecified: Secondary | ICD-10-CM | POA: Diagnosis not present

## 2020-09-25 DIAGNOSIS — K3184 Gastroparesis: Secondary | ICD-10-CM | POA: Diagnosis not present

## 2020-09-25 NOTE — Patient Instructions (Addendum)
Try reducing Reglan to 5 mg twice daily.   Continue to use Zofran 4 mg every 8 hours as needed for nausea/vomiting.  Please let me know if you need a refill on this.  Please follow a gastroparesis diet:  Try eating 4-6 small meals daily. Follow a low fiber and low-fat diet avoiding raw fruits and vegetables, fried, fatty, greasy foods.  Continue drinking Ensure daily.  You may try to increase back to 2 Ensure daily.  Continue Protonix 40 mg twice daily 30 minutes before breakfast and dinner.  Continue Carafate as needed for breakthrough reflux/abdominal burning.  Continue Linzess 145 mcg daily for constipation.  We will plan to see you back in 6 months.  Do not hesitate to call if you have questions or concerns prior to your next visit.   It was great meeting you today!   Aliene Altes, PA-C [facility]

## 2020-09-26 DIAGNOSIS — F333 Major depressive disorder, recurrent, severe with psychotic symptoms: Secondary | ICD-10-CM | POA: Diagnosis not present

## 2020-09-30 ENCOUNTER — Telehealth: Payer: Self-pay

## 2020-09-30 NOTE — Telephone Encounter (Signed)
After receiving a second refill request, pt was called to confirm request. Pt is no longer on Estradial 1 mg, but rather 2 mg. She has plenty of refills on the 2 mg, so the request for 1 mg was rejected. Pt confirmed understanding.

## 2020-10-08 ENCOUNTER — Encounter: Payer: Self-pay | Admitting: Obstetrics & Gynecology

## 2020-10-08 ENCOUNTER — Ambulatory Visit (INDEPENDENT_AMBULATORY_CARE_PROVIDER_SITE_OTHER): Payer: Medicare HMO | Admitting: Obstetrics & Gynecology

## 2020-10-08 ENCOUNTER — Ambulatory Visit (INDEPENDENT_AMBULATORY_CARE_PROVIDER_SITE_OTHER): Payer: Medicare HMO

## 2020-10-08 ENCOUNTER — Other Ambulatory Visit: Payer: Self-pay

## 2020-10-08 VITALS — BP 119/81 | HR 59 | Ht 66.0 in | Wt 160.0 lb

## 2020-10-08 DIAGNOSIS — E2839 Other primary ovarian failure: Secondary | ICD-10-CM | POA: Diagnosis not present

## 2020-10-08 DIAGNOSIS — N950 Postmenopausal bleeding: Secondary | ICD-10-CM | POA: Diagnosis not present

## 2020-10-08 MED ORDER — PROGESTERONE 200 MG PO CAPS
ORAL_CAPSULE | ORAL | 11 refills | Status: DC
Start: 2020-10-08 — End: 2021-10-19

## 2020-10-08 NOTE — Progress Notes (Signed)
Follow up appointment for results  Chief Complaint  Patient presents with   Follow-up    Discuss [place] results    Blood pressure 119/81, pulse (!) 59, height 5\' 6"  (1.676 m), weight 160 lb (72.6 kg).  US PELVIC COMPLETE WITH TRANSVAGINAL  Result Date: 10/08/2020 Images from the original result were not included.  [facility] @ [facility] [address] [place] [address]                                                                                                                                   GYNECOLOGIC SONOGRAM Tracey Daniel is a 40 y.o. G0P0 No LMP recorded (lmp unknown). Patient is postmenopausal. She is here for a pelvic sonogram for postmenopausal bleeding. Uterus                      3.9 x 2.1 x 3 cm, Total uterine volume 12.7 cc, atrophic homogeneous anteverted uterus,WNL Endometrium          2.9 mm, symmetrical, complex fluid filled endometrium Right ovary             1 x .7 x .8 cm, WNL,limited view Left ovary                1.1 x .7 x .8 cm, WNL,limited view No free fluid Technician Comments: PELVIC US TA/TV: atrophic homogeneous anteverted uterus,complex fluid filled endometrium (fundal/mid uterus),EEC 2.9 mm,limited view of ovaries,ovaries wnl,no free fluid,no pain during ultrasound Chaperone [address] Heide Guile 10/08/2020 2:06 PM Clinical Impression and recommendations: I have reviewed the sonogram results above, combined with the patient's current clinical course, below are my impressions and any appropriate recommendations for management based on the sonographic findings. Uterus is quite small, atrophic Endometrium is significant for small hematometra, ES itself is quite thin Both ovaries are consistent with menopause Florian Buff 10/08/2020 2:43 PM      MEDS ordered this encounter: No orders of the defined types were placed in this encounter.   Orders for this encounter: No orders of the defined types were placed in this  encounter.   Impression:   ICD-10-CM   1. PMB (postmenopausal bleeding), on cyclical prometrium as well as estrace  N95.0     2. Premature ovarian failure  E28.39        Plan: To try to promote long term amenorrhea, prometrium 200 mg days 1-21 of each month  Follow Up: No follow-ups on file.     All questions were answered.  Past Medical History:  Diagnosis Date   Alopecia    Anemia of other chronic disease 11/29/2012   Asthma    Back pain, chronic    Chronic abdominal pain    Constipation    COPD (chronic obstructive pulmonary disease) ([place])    Cyst of skin    mid spine area   Depression 2000  h/o suicidal ideation   Gastric outlet obstruction    Gastritis    Gastroparesis    LGSIL of cervix of undetermined significance 07/08/2020   07/08/20 pt aware will get colpo, per [facility]   Migraine    Migraines    Nausea and vomiting    recurrent   Nicotine addiction    Osteoporosis    Peptic ulcer disease    Pyloric spasm 03/30/2011   Seasonal allergies    Sinusitis    Substance abuse ([place]) 2008   marijuana   Vitamin D deficiency     Past Surgical History:  Procedure Laterality Date   BALLOON DILATION N/A 02/09/2013   Procedure: BALLOON DILATION;  Surgeon: Missy Sabins, MD;  Location: [facility] END[facility];  Service: Endoscopy;  Laterality: N/A;   BALLOON DILATION N/A 03/10/2018   Procedure: BALLOON DILATION;  Surgeon: Daneil Dolin, MD;  Location: [facility];  Service: Endoscopy;  Laterality: N/A;   BILIARY DILATION  06/01/2018   Procedure: PYLORIC DILATION;  Surgeon: Rush Landmark Telford Nab., MD;  Location: [facility] ENDOSCOPY;  Service: Gastroenterology;;   BIOPSY  06/01/2018   Procedure: BIOPSY;  Surgeon: Irving Copas., MD;  Location[facility] ENDOSCOPY;  Service: Gastroenterology;;   BOTOX INJECTION N/A 02/09/2013   Procedure: BOTOX INJECTION;  Surgeon: Missy Sabins, MD;  Location: [facility] ENDOSCOPY;  Service: Endoscopy;  Laterality: N/A;  possible balloon   BOTOX  INJECTION N/A 10/24/2015   Procedure: BOTOX INJECTION;  Surgeon: Daneil Dolin, MD;  Location: [facility];  Service: Endoscopy;  Laterality: N/A;   BOTOX INJECTION N/A 03/10/2018   Procedure: BOTOX INJECTION;  Surgeon: Daneil Dolin, MD;  [facility];  Service: Endoscopy;  Laterality: N/A;  Melanie notified   BOTOX INJECTION  06/01/2018   Procedure: BOTOX INJECTION;  Surgeon: Rush Landmark Telford Nab., MD;  Location: [place];  Service: Gastroenterology;;   CARPAL TUNNEL RELEASE     left hand   COLONOSCOPY WITH PROPOFOL N/A 09/20/2014   RMR: Normal ileo-colonoscopy   ESOPHAGEAL DILATION  12/25/2010   Procedure: ESOPHAGEAL DILATION;  Surgeon: Dorothyann Peng, MD;  [facility];  Service: Endoscopy;;   ESOPHAGEAL MANOMETRY N/A 10/03/2018   Surgeon: Mauri Pole, MD;  Normal.   ESOPHAGOGASTRODUODENOSCOPY  12/25/2010   Dorothyann Peng, MD;  moderate gastritis, ?goo secondary to pylorspasm. BX showed reactive gstropathy no h.pyori, SB mucosa with intramucosal lymphocytosis and partial villous blunting (TTG 4.0 normal)   ESOPHAGOGASTRODUODENOSCOPY N/A 11/14/2012   SVX:BLTJQ hiatal hernia. Abnormal gastric mucosa of  uncertain significance-status post biopsy. Subjectively, patient may have recurrent symptomatic, pylorospasm.   ESOPHAGOGASTRODUODENOSCOPY (EGD) WITH PROPOFOL N/A 02/09/2013   elongated stomach, partial lower esophageal ring widely patent. No obvious pyloric stenosis s/p Botox   ESOPHAGOGASTRODUODENOSCOPY (EGD) WITH PROPOFOL N/A 10/24/2015   Procedure: ESOPHAGOGASTRODUODENOSCOPY (EGD) WITH PROPOFOL;  Surgeon: Daneil Dolin, MD;  [facility];  Service: Endoscopy;  Laterality: N/A;  830    ESOPHAGOGASTRODUODENOSCOPY (EGD) WITH PROPOFOL N/A 03/10/2018   Dr. Gala Romney: normal esophagus, elongated, dilated stomach likely stigmata of slow gastric emptying due to narrowing of pyloric channel, s/p balloon dilatation of pyloric channel. Small hiatal  hernia, normal duodenal bulb and second portion of duodenum   ESOPHAGOGASTRODUODENOSCOPY (EGD) WITH PROPOFOL N/A 06/01/2018   Surgeon: Irving Copas., MD; normal esophagus s/p biopsy, small hiatal hernia, erythematous mucosa in the cardia, gastric fundus, gastric body, erosive gastropathy s/p biopsy, normal pylorus s/p dilation and Botox injection, normal duodenum s/p biopsy.  Surgical pathology all benign.   LAPAROSCOPIC CHOLECYSTECTOMY  2002   [facility]?  [place]?   laser surgery on cervix     NASAL SEPTUM SURGERY  01/29/2017   TOE SURGERY      OB History     Gravida  0   Para      Term      Preterm      AB      Living         SAB      IAB      Ectopic      Multiple      Live Births              Allergies  Allergen Reactions   Benadryl [Diphenhydramine] Anaphylaxis   Penicillins Other (See Comments)    Unknown Has patient had a PCN reaction causing immediate rash, facial/tongue/throat swelling, SOB or lightheadedness with hypotension Yes Has patient had a PCN reaction causing severe rash involving mucus membranes or skin necrosis: Yes-hives  Has patient had a PCN reaction that required hospitalization Yes Has patient had a PCN reaction occurring within the last 10 years: Yes If all of the above answers are "NO", then may proceed with Cephalosporin use.    Latex Itching and Other (See Comments)    "burning" where the tape goes    Social History   Socioeconomic History   Marital status: Significant Other    Spouse name: Not on file   Number of children: 0   Years of education: 9   Highest education level: 9th grade  Occupational History   Occupation: disability    Employer: UNEMPLOYED  Tobacco Use   Smoking status: Every Day    Packs/day: 0.10    Years: 15.00    Pack years: 1.50    Types: Cigarettes   Smokeless tobacco: Never   Tobacco comments:    2 per day  Vaping Use   Vaping Use: Never used  Substance and Sexual Activity    Alcohol use: No    Alcohol/week: 0.0 standard drinks   Drug use: Yes    Frequency: 2.0 times per week    Types: Marijuana   Sexual activity: Yes    Birth control/protection: Post-menopausal  Other Topics Concern   Not on file  Social History Narrative   Not on file   Social Determinants of Health   Financial Resource Strain: Low Risk    Difficulty of Paying Living Expenses: Not very hard  Food Insecurity: Food Insecurity Present   Worried About Charity fundraiser in the Last Year: Often true   Arboriculturist in the Last Year: Sometimes true  Transportation Needs: No Transportation Needs   Lack of Transportation (Medical): No   Lack of Transportation (Non-Medical): No  Physical Activity: Insufficiently Active   Days of Exercise per Week: 2 days   Minutes of Exercise per Session: 20 min  Stress: No Stress Concern Present   Feeling of Stress : Not at all  Social Connections: Moderately Isolated   Frequency of Communication with Friends and Family: Twice a week   Frequency of Social Gatherings with Friends and Family: Once a week   Attends Religious Services: Never   Marine scientist or Organizations: No   Attends Music therapist: Never   Marital Status: Living with partner    Family History  Problem Relation Age of Onset   Diabetes Father    Liver disease Father  liver transplant at [facility], age 39   Lung cancer Mother 13   Heart disease Maternal Grandmother    Parkinson's disease Maternal Grandfather    Multiple sclerosis Sister 2   Depression Sister 39   Alcohol abuse Sister    Bipolar disorder Sister    Schizophrenia Sister    Colon cancer Neg Hx

## 2020-10-08 NOTE — Progress Notes (Signed)
PELVIC US TA/TV: atrophic homogeneous anteverted uterus,complex fluid filled endometrium (fundal/mid uterus),EEC 2.9 mm,limited view of ovaries,ovaries wnl,no free fluid,no pain during ultrasound  Chaperone [place]

## 2020-10-23 DIAGNOSIS — M542 Cervicalgia: Secondary | ICD-10-CM | POA: Diagnosis not present

## 2020-10-23 DIAGNOSIS — M791 Myalgia, unspecified site: Secondary | ICD-10-CM | POA: Diagnosis not present

## 2020-10-23 DIAGNOSIS — G518 Other disorders of facial nerve: Secondary | ICD-10-CM | POA: Diagnosis not present

## 2020-10-23 DIAGNOSIS — G43719 Chronic migraine without aura, intractable, without status migrainosus: Secondary | ICD-10-CM | POA: Diagnosis not present

## 2020-10-29 ENCOUNTER — Other Ambulatory Visit: Payer: Self-pay | Admitting: Family Medicine

## 2020-10-30 ENCOUNTER — Encounter: Payer: Self-pay | Admitting: Dermatology

## 2020-10-30 ENCOUNTER — Ambulatory Visit (INDEPENDENT_AMBULATORY_CARE_PROVIDER_SITE_OTHER): Payer: Medicare HMO | Admitting: Dermatology

## 2020-10-30 ENCOUNTER — Other Ambulatory Visit: Payer: Self-pay

## 2020-10-30 DIAGNOSIS — L920 Granuloma annulare: Secondary | ICD-10-CM

## 2020-10-30 DIAGNOSIS — L669 Cicatricial alopecia, unspecified: Secondary | ICD-10-CM

## 2020-10-30 DIAGNOSIS — L668 Other cicatricial alopecia: Secondary | ICD-10-CM | POA: Diagnosis not present

## 2020-10-30 MED ORDER — MINOCYCLINE HCL 50 MG PO CAPS: 50.0000 mg | ORAL_CAPSULE | Freq: Two times a day (BID) | ORAL | 2 refills | Status: DC

## 2020-11-12 ENCOUNTER — Other Ambulatory Visit: Payer: Self-pay

## 2020-11-12 ENCOUNTER — Ambulatory Visit (INDEPENDENT_AMBULATORY_CARE_PROVIDER_SITE_OTHER): Payer: Medicare HMO | Admitting: Family Medicine

## 2020-11-12 ENCOUNTER — Encounter: Payer: Self-pay | Admitting: Family Medicine

## 2020-11-12 VITALS — BP 126/79 | HR 85 | Resp 17 | Ht 66.0 in | Wt 159.0 lb

## 2020-11-12 DIAGNOSIS — F17218 Nicotine dependence, cigarettes, with other nicotine-induced disorders: Secondary | ICD-10-CM

## 2020-11-12 DIAGNOSIS — Z716 Tobacco abuse counseling: Secondary | ICD-10-CM

## 2020-11-12 DIAGNOSIS — E2839 Other primary ovarian failure: Secondary | ICD-10-CM | POA: Diagnosis not present

## 2020-11-12 DIAGNOSIS — F324 Major depressive disorder, single episode, in partial remission: Secondary | ICD-10-CM

## 2020-11-12 DIAGNOSIS — K219 Gastro-esophageal reflux disease without esophagitis: Secondary | ICD-10-CM

## 2020-11-12 DIAGNOSIS — E559 Vitamin D deficiency, unspecified: Secondary | ICD-10-CM

## 2020-11-12 DIAGNOSIS — F4321 Adjustment disorder with depressed mood: Secondary | ICD-10-CM | POA: Diagnosis not present

## 2020-11-12 DIAGNOSIS — F17210 Nicotine dependence, cigarettes, uncomplicated: Secondary | ICD-10-CM

## 2020-11-12 DIAGNOSIS — E782 Mixed hyperlipidemia: Secondary | ICD-10-CM

## 2020-11-12 DIAGNOSIS — Z23 Encounter for immunization: Secondary | ICD-10-CM

## 2020-11-12 DIAGNOSIS — N912 Amenorrhea, unspecified: Secondary | ICD-10-CM

## 2020-11-12 NOTE — Patient Instructions (Addendum)
F/U in 4 months, callif you need me sooner  Our condolence and prayers at this diffcult time, please reach out to your therapist  Labs today  lipid, cmp and EGFr, and TSH  Call [phone] NOW and cut back by 1 cigarette each week, now at 2 PPD , should be 1 PPD when you next come  Pneumonia 20 and Fl;u vacines today  It is important that you exercise regularly at least 30 minutes 5 times a week. If you develop chest pain, have severe difficulty breathing, or feel very tired, stop exercising immediately and seek medical attention    Thanks for choosing [facility], we consider it a privelige to serve you.

## 2020-11-13 ENCOUNTER — Encounter: Payer: Self-pay | Admitting: Family Medicine

## 2020-11-13 DIAGNOSIS — F4321 Adjustment disorder with depressed mood: Secondary | ICD-10-CM | POA: Insufficient documentation

## 2020-11-13 LAB — CMP14+EGFR
ALT: 22 IU/L (ref 0–32)
AST: 22 IU/L (ref 0–40)
Albumin/Globulin Ratio: 1.9 (ref 1.2–2.2)
Albumin: 4.8 g/dL (ref 3.8–4.8)
Alkaline Phosphatase: 106 IU/L (ref 44–121)
BUN/Creatinine Ratio: 26 — ABNORMAL HIGH (ref 9–23)
BUN: 23 mg/dL (ref 6–24)
Bilirubin Total: 0.2 mg/dL (ref 0.0–1.2)
CO2: 20 mmol/L (ref 20–29)
Calcium: 9.8 mg/dL (ref 8.7–10.2)
Chloride: 104 mmol/L (ref 96–106)
Creatinine, Ser: 0.89 mg/dL (ref 0.57–1.00)
Globulin, Total: 2.5 g/dL (ref 1.5–4.5)
Glucose: 85 mg/dL (ref 70–99)
Potassium: 4.5 mmol/L (ref 3.5–5.2)
Sodium: 139 mmol/L (ref 134–144)
Total Protein: 7.3 g/dL (ref 6.0–8.5)
eGFR: 83 mL/min/{1.73_m2} (ref >59)

## 2020-11-13 LAB — LIPID PANEL
Chol/HDL Ratio: 2.6 ratio (ref 0.0–4.4)
Cholesterol, Total: 124 mg/dL (ref 100–199)
HDL: 48 mg/dL (ref >39)
LDL Chol Calc (NIH): 45 mg/dL (ref 0–99)
Triglycerides: 195 mg/dL — ABNORMAL HIGH (ref 0–149)
VLDL Cholesterol Cal: 31 mg/dL (ref 5–40)

## 2020-11-13 LAB — TSH: TSH: 1.36 u[IU]/mL (ref 0.450–4.500)

## 2020-11-13 NOTE — Assessment & Plan Note (Addendum)
Not suicidal or homicidal, currnelty, followed by Psych, grieving currently and depressed

## 2020-11-13 NOTE — Assessment & Plan Note (Addendum)
Currently acutely  Grieving the  Recent unexpected loss of a beloved aunt, being buried later today, I encouraged her to reach out to her therapist for help through this very difficult period

## 2020-11-13 NOTE — Progress Notes (Signed)
   Tracey Daniel     MRN: [id number]      DOB: 16-Apr-1980   HPI Tracey Daniel is here for follow up and re-evaluation of chronic medical conditions, medication management and review of any available recent lab and radiology data.  Preventive health is updated, specifically  Cancer screening and Immunization.   . The PT denies any adverse reactions to current medications since the last visit.  Increased stress due to ill health of her sister and recent passing of a beloved Aunt   ROS Denies recent fever or chills. Denies sinus pressure, nasal congestion, ear pain or sore throat. Denies chest congestion, productive cough or wheezing. Denies chest pains, palpitations and leg swelling Denies abdominal pain, nausea, vomiting,diarrhea or constipation.   Denies dysuria, frequency, hesitancy or incontinence. Denies joint pain, swelling and limitation in mobility. Denies headaches, seizures, numbness, or tingling.  Denies skin break down or rash.   PE  BP 126/79   Pulse 85   Resp 17   Ht 5\' 6"  (1.676 m)   Wt 159 lb 0.6 oz (72.1 kg)   LMP  (LMP Unknown) Comment: pt reports premature menopause  SpO2 98%   BMI 25.67 kg/m   Patient alert and oriented and in no cardiopulmonary distress.  HEENT: No facial asymmetry, EOMI,     Neck supple .  Chest: Clear to auscultation bilaterally.Decreased air entry  CVS: S1, S2 no murmurs, no S3.Regular rate.  ABD: Soft non tender.   Ext: No edema  MS: Adequate ROM spine, shoulders, hips and knees.  Skin: Intact, no ulcerations or rash noted.  Psych: Good eye contact, tearful at timest. Memory intact not anxious , mildly  depressed appearing.  CNS: CN 2-12 intact, power,  normal throughout.no focal deficits noted.   Assessment & Plan  Grief Currently acutely  Grieving the  Recent unexpected loss of a beloved aunt, being buried later today, I encouraged her to reach out to her therapist for help through this very difficult  period  Nicotine dependence, cigarettes, with other nicotine-induced disorders Asked:confirms currently smokes cigarettes, 2PPD Assess: Unwilling to set a quit date,states unable to try to cut back now due to grief, anxiety , stress Advise: needs to QUIT to reduce risk of cancer, cardio and cerebrovascular disease Assist: counseled for 5 minutes and literature provided Arrange: follow up in 2 to 4 months   Premature ovarian failure Followed by Gyne  Depression Not suicidal or homicidal, currnelty, followed by Psych  Gastroesophageal reflux disease without esophagitis Currently stable and controlled on medication, continue same

## 2020-11-13 NOTE — Assessment & Plan Note (Signed)
Followed by Gyne 

## 2020-11-13 NOTE — Assessment & Plan Note (Signed)
Currently stable and controlled on medication, continue same

## 2020-11-13 NOTE — Assessment & Plan Note (Signed)
Asked:confirms currently smokes cigarettes, 2PPD Assess: Unwilling to set a quit date,states unable to try to cut back now due to grief, anxiety , stress Advise: needs to QUIT to reduce risk of cancer, cardio and cerebrovascular disease Assist: counseled for 5 minutes and literature provided Arrange: follow up in 2 to 4 months

## 2020-11-15 ENCOUNTER — Encounter: Payer: Self-pay | Admitting: Dermatology

## 2020-11-15 NOTE — Progress Notes (Signed)
   Follow-Up Visit   Subjective  Tracey Daniel is a 40 y.o. female who presents for the following: Follow-up (Patient here today for follow up on GA and central centrifugal alopecia. Per patient since stopping the Plaquenil she's doing well, per patient the clobetasol did help some with the central centrifugal alopecia she's still having burning, stinging, and sensitive to touch. ).  Granuloma annulare plus scalp problem Location:  Duration:  Quality:  Associated Signs/Symptoms: Modifying Factors:  Severity:  Timing: Context:   Objective  Well appearing patient in no apparent distress; mood and affect are within normal limits. Left Arm, Right Arm We again discussed how little is known of the cause of granuloma annulare along with the absence of FDA approved therapy.  Currently her spots are asymptomatic.  Mid Occipital Scalp, Mid Parietal Scalp Still with some boggy inflammation.  I do feel there is definite improvement.    A focused examination was performed including head, neck, arms.. Relevant physical exam findings are noted in the Assessment and Plan.   Assessment & Plan    Granuloma annulare Left Arm; Right Arm  No new intervention initiated.  Related Medications minocycline (MINOCIN) 50 MG capsule Take 1 capsule (50 mg total) by mouth 2 (two) times daily.  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  Related Medications clobetasol (OLUX) 0.05 % topical foam Apply topically 2 (two) times daily.      I, Lavonna Monarch, MD, have reviewed all documentation for this visit.  The documentation on 11/15/20 for the exam, diagnosis, procedures, and orders are all accurate and complete.

## 2020-11-17 ENCOUNTER — Encounter: Payer: Self-pay | Admitting: Family Medicine

## 2020-11-17 NOTE — Assessment & Plan Note (Signed)
Hyperlipidemia:Low fat diet discussed and encouraged.   Lipid Panel  Lab Results  Component Value Date   CHOL 124 11/12/2020   HDL 48 11/12/2020   LDLCALC 45 11/12/2020   TRIG 195 (H) 11/12/2020   CHOLHDL 2.6 11/12/2020   Needs to reduce fat in diet

## 2020-11-17 NOTE — Assessment & Plan Note (Signed)
Updated lab needed at/ before next visit.   

## 2020-11-22 ENCOUNTER — Other Ambulatory Visit: Payer: Self-pay

## 2020-11-22 ENCOUNTER — Ambulatory Visit (INDEPENDENT_AMBULATORY_CARE_PROVIDER_SITE_OTHER): Payer: Medicare HMO | Admitting: *Deleted

## 2020-11-22 DIAGNOSIS — Z0000 Encounter for general adult medical examination without abnormal findings: Secondary | ICD-10-CM | POA: Diagnosis not present

## 2020-11-22 NOTE — Progress Notes (Signed)
Subjective:   Tracey Daniel is a 40 y.o. female who presents for Medicare Annual (Subsequent) preventive examination. I connected with  Tracey Daniel on 11/22/20 by audio enabled telemedicine application and verified that I am speaking with the correct person using two identifiers.   I discussed the limitations of evaluation and management by telemedicine. The patient expressed understanding and agreed to proceed.   Review of Systems           Objective:    There were no vitals filed for this visit. There is no height or weight on file to calculate BMI.  Advanced Directives 11/13/2019 05/09/2019 04/26/2019 11/11/2018 06/01/2018 05/31/2018 05/31/2018  Does Patient Have a Medical Advance Directive? No No No No No No No  Would patient like information on creating a medical advance directive? No - Patient declined Yes (MAU/Ambulatory/Procedural Areas - Information given) No - Patient declined - - No - Patient declined -  Pre-existing out of facility DNR order (yellow form or pink MOST form) - - - - - - -    Current Medications (verified) Outpatient Encounter Medications as of 11/22/2020  Medication Sig   acetaminophen (TYLENOL) 325 MG tablet Take 2 tablets (650 mg total) by mouth every 6 (six) hours as needed for mild pain (or Fever >/= 101).   azelastine (ASTELIN) 0.1 % nasal spray USE 2 SPRAYS IN EACH NOSTRIL TWICE DAILY AS DIRECTED   budesonide-formoterol (SYMBICORT) 80-4.5 MCG/ACT inhaler Inhale 2 puffs into the lungs 2 (two) times daily.   clobetasol (OLUX) 0.05 % topical foam Apply topically 2 (two) times daily.   clotrimazole-betamethasone (LOTRISONE) cream Apply sparingly to rash on face once daily for 1 week   diclofenac (VOLTAREN) 75 MG EC tablet Take one tablet by mouth once daily for chronic back pain   estradiol (ESTRACE) 0.1 MG/GM vaginal cream INSERT 1.66 APPLICATORFUL VAGINALLY THREE TIMES WEEKLY AS DIRECTED   estradiol (ESTRACE) 2 MG tablet Take 1 tablet (2 mg  total) by mouth daily.   feeding supplement (ENSURE CLINICAL STRENGTH) LIQD Take 237 mLs by mouth 3 (three) times daily with meals.   fluticasone (FLONASE) 50 MCG/ACT nasal spray SHAKE LIQUID AND USE 2 SPRAYS IN EACH NOSTRIL DAILY   gabapentin (NEURONTIN) 100 MG capsule TAKE 1 CAPSULE(100 MG) BY MOUTH THREE TIMES DAILY   hydrocortisone 2.5 % ointment    linaclotide (LINZESS) 145 MCG CAPS capsule Take 1 capsule (145 mcg total) by mouth daily before breakfast.   metoCLOPramide (REGLAN) 10 MG tablet TAKE 1 TABLET(10 MG) BY MOUTH EVERY 8 HOURS AS NEEDED FOR NAUSEA   minocycline (MINOCIN) 50 MG capsule Take 1 capsule (50 mg total) by mouth 2 (two) times daily.   ondansetron (ZOFRAN-ODT) 4 MG disintegrating tablet Take 1 tablet (4 mg total) by mouth every 8 (eight) hours as needed for nausea or vomiting.   pantoprazole (PROTONIX) 40 MG tablet Take 1 tablet (40 mg total) by mouth 2 (two) times daily before a meal.   potassium chloride SA (KLOR-CON) 20 MEQ tablet TAKE 1 TABLET BY MOUTH EVERY DAY   prazosin (MINIPRESS) 1 MG capsule    progesterone (PROMETRIUM) 200 MG capsule 1 capsule daily at bedtime days 1-21 of each month   propranolol (INDERAL) 10 MG tablet Take 1 tablet (10 mg total) by mouth 3 (three) times daily.   risperiDONE (RISPERDAL) 0.5 MG tablet Take 0.5 mg at bedtime by mouth.   rosuvastatin (CRESTOR) 20 MG tablet Take 1 tablet (20 mg total) by mouth daily.  sertraline (ZOLOFT) 50 MG tablet Take 50 mg by mouth at bedtime.    sucralfate (CARAFATE) 1 g tablet One tablet every morning and at bedtime for stomach burning.   tiZANidine (ZANAFLEX) 4 MG tablet TAKE 1 TABLET(4 MG) BY MOUTH BACK TWICE DAILY AS NEEDED FOR SPASMS   VENTOLIN HFA 108 (90 Base) MCG/ACT inhaler INHALE 2 PUFFS INTO THE LUNGS EVERY 6 HOURS AS NEEDED FOR SHORTNESS OF BREATH   Vitamin D, Ergocalciferol, (DRISDOL) 1.25 MG (50000 UNIT) CAPS capsule TAKE 1 CAPSULE BY MOUTH 1 TIME A WEEK   zonisamide (ZONEGRAN) 50 MG capsule  Take 150 mg by mouth at bedtime.    No facility-administered encounter medications on file as of 11/22/2020.    Allergies (verified) Benadryl [diphenhydramine], Penicillins, and Latex   History: Past Medical History:  Diagnosis Date   Alopecia    Anemia of other chronic disease 11/29/2012   Asthma    Back pain, chronic    Chronic abdominal pain    Constipation    COPD (chronic obstructive pulmonary disease) (HCC)    Cyst of skin    mid spine area   Depression 2000   h/o suicidal ideation   Gastric outlet obstruction    Gastritis    Gastroparesis    LGSIL of cervix of undetermined significance 07/08/2020   07/08/20 pt aware will get colpo, per [facility]   Migraine    Migraines    Nausea and vomiting    recurrent   Nicotine addiction    Osteoporosis    Peptic ulcer disease    Pyloric spasm 03/30/2011   Seasonal allergies    Sinusitis    Substance abuse ([place]) 2008   marijuana   Vitamin D deficiency    Past Surgical History:  Procedure Laterality Date   BALLOON DILATION N/A 02/09/2013   Procedure: BALLOON DILATION;  Surgeon: Missy Sabins, MD;  Location: [facility];  Service: Endoscopy;  Laterality: N/A;   BALLOON DILATION N/A 03/10/2018   Procedure: BALLOON DILATION;  Surgeon: Daneil Dolin, MD;  Location: [facility];  Service: Endoscopy;  Laterality: N/A;   BILIARY DILATION  06/01/2018   Procedure: PYLORIC DILATION;  Surgeon: Rush Landmark Telford Nab., MD;  Location: [facility];  Service: Gastroenterology;;   BIOPSY  06/01/2018   Procedure: BIOPSY;  Surgeon: Irving Copas., MD;  Location[facility];  Service: Gastroenterology;;   BOTOX INJECTION N/A 02/09/2013   Procedure: BOTOX INJECTION;  Surgeon: Missy Sabins, MD;  Location: [facility];  Service: Endoscopy;  Laterality: N/A;  possible balloon   BOTOX INJECTION N/A 10/24/2015   Procedure: BOTOX INJECTION;  Surgeon: Daneil Dolin, MD;  Location: [facility];  Service: Endoscopy;  Laterality: N/A;    BOTOX INJECTION N/A 03/10/2018   Procedure: BOTOX INJECTION;  Surgeon: Daneil Dolin, MD;  [facility];  Service: Endoscopy;  Laterality: N/A;  Melanie notified   BOTOX INJECTION  06/01/2018   Procedure: BOTOX INJECTION;  Surgeon: Rush Landmark Telford Nab., MD;  Location: [place];  Service: Gastroenterology;;   CARPAL TUNNEL RELEASE     left hand   COLONOSCOPY WITH PROPOFOL N/A 09/20/2014   RMR: Normal ileo-colonoscopy   ESOPHAGEAL DILATION  12/25/2010   Procedure: ESOPHAGEAL DILATION;  Surgeon: Dorothyann Peng, MD;  [facility];  Service: Endoscopy;;   ESOPHAGEAL MANOMETRY N/A 10/03/2018   Surgeon: Mauri Pole, MD;  Normal.   ESOPHAGOGASTRODUODENOSCOPY  12/25/2010   Dorothyann Peng, MD;  moderate gastritis, ?goo secondary to  pylorspasm. BX showed reactive gstropathy no h.pyori, SB mucosa with intramucosal lymphocytosis and partial villous blunting (TTG 4.0 normal)   ESOPHAGOGASTRODUODENOSCOPY N/A 11/14/2012   OQH:UTMLY hiatal hernia. Abnormal gastric mucosa of  uncertain significance-status post biopsy. Subjectively, patient may have recurrent symptomatic, pylorospasm.   ESOPHAGOGASTRODUODENOSCOPY (EGD) WITH PROPOFOL N/A 02/09/2013   elongated stomach, partial lower esophageal ring widely patent. No obvious pyloric stenosis s/p Botox   ESOPHAGOGASTRODUODENOSCOPY (EGD) WITH PROPOFOL N/A 10/24/2015   Procedure: ESOPHAGOGASTRODUODENOSCOPY (EGD) WITH PROPOFOL;  Surgeon: Daneil Dolin, MD;  [facility];  Service: Endoscopy;  Laterality: N/A;  830    ESOPHAGOGASTRODUODENOSCOPY (EGD) WITH PROPOFOL N/A 03/10/2018   Dr. Gala Romney: normal esophagus, elongated, dilated stomach likely stigmata of slow gastric emptying due to narrowing of pyloric channel, s/p balloon dilatation of pyloric channel. Small hiatal hernia, normal duodenal bulb and second portion of duodenum   ESOPHAGOGASTRODUODENOSCOPY (EGD) WITH PROPOFOL N/A 06/01/2018   Surgeon: Irving Copas., MD; normal esophagus s/p biopsy, small hiatal hernia, erythematous mucosa in the cardia, gastric fundus, gastric body, erosive gastropathy s/p biopsy, normal pylorus s/p dilation and Botox injection, normal duodenum s/p biopsy.  Surgical pathology all benign.   LAPAROSCOPIC CHOLECYSTECTOMY  2002   [facility]?  [place]?   laser surgery on cervix     NASAL SEPTUM SURGERY  01/29/2017   TOE SURGERY     Family History  Problem Relation Age of Onset   Diabetes Father    Liver disease Father        liver transplant at [facility], age 4   Lung cancer Mother 64   Heart disease Maternal Grandmother    Parkinson's disease Maternal Grandfather    Multiple sclerosis Sister 21   Depression Sister 44   Alcohol abuse Sister    Bipolar disorder Sister    Schizophrenia Sister    Colon cancer Neg Hx    Social History   Socioeconomic History   Marital status: Significant Other    Spouse name: Not on file   Number of children: 0   Years of education: 9   Highest education level: 9th grade  Occupational History   Occupation: disability    Fish farm manager: UNEMPLOYED  Tobacco Use   Smoking status: Every Day    Packs/day: 0.10    Years: 15.00    Pack years: 1.50    Types: Cigarettes   Smokeless tobacco: Never   Tobacco comments:    2 per day  Vaping Use   Vaping Use: Never used  Substance and Sexual Activity   Alcohol use: No    Alcohol/week: 0.0 standard drinks   Drug use: Yes    Frequency: 2.0 times per week    Types: Marijuana   Sexual activity: Yes    Birth control/protection: Post-menopausal  Other Topics Concern   Not on file  Social History Narrative   Not on file   Social Determinants of Health   Financial Resource Strain: Low Risk    Difficulty of Paying Living Expenses: Not very hard  Food Insecurity: Food Insecurity Present   Worried About Charity fundraiser in the Last Year: Often true   Arboriculturist in the Last Year: Sometimes true  Transportation Needs: No  Transportation Needs   Lack of Transportation (Medical): No   Lack of Transportation (Non-Medical): No  Physical Activity: Insufficiently Active   Days of Exercise per Week: 2 days   Minutes of Exercise per Session: 20 min  Stress: No Stress Concern Present  Feeling of Stress : Not at all  Social Connections: Moderately Isolated   Frequency of Communication with Friends and Family: Twice a week   Frequency of Social Gatherings with Friends and Family: Once a week   Attends Religious Services: Never   Marine scientist or Organizations: No   Attends Music therapist: Never   Marital Status: Living with partner    Tobacco Counseling Ready to quit: Not Answered Counseling given: Not Answered Tobacco comments: 2 per day   Clinical Intake:                 Diabetic?No         Activities of Daily Living No flowsheet data found.  Patient Care Team: [name], MD as PCP - General Rourk, Cristopher Estimable, MD (Gastroenterology) Whitney Muse Kelby Fam, MD (Inactive) as Consulting Physician (Hematology and Oncology) Scherrie November, MD as Referring Physician (Gastroenterology) Mansouraty, Telford Nab., MD as Consulting Physician (Gastroenterology) Michael Boston, MD as Consulting Physician (General Surgery) Lavonna Monarch, MD as Consulting Physician (Dermatology)  Indicate any recent Medical Services you may have received from other than [facility] providers in the past year (date may be approximate).     Assessment:   This is a routine wellness examination for Tracey Daniel.  Hearing/Vision screen No results found.  Dietary issues and exercise activities discussed:     Goals Addressed   None   Depression Screen PHQ 2/9 Scores 07/01/2020 06/12/2020 12/05/2019 11/13/2019 11/13/2019 10/05/2019 08/08/2019  PHQ - 2 Score 1 0 0 0 0 0 0  PHQ- 9 Score 4 - - - - - 1    Fall Risk Fall Risk  08/22/2020 06/12/2020 05/06/2020 12/05/2019 11/13/2019  Falls in the past year? 0  0 0 0 0  Number falls in past yr: - 0 0 - 0  Injury with Fall? - 0 0 - 0  Risk for fall due to : - No Fall Risks - - No Fall Risks  Follow up - Falls evaluation completed - - Falls evaluation completed    FALL RISK PREVENTION PERTAINING TO THE HOME:  Any stairs in or around the home? No  If so, are there any without handrails?  NA Home free of loose throw rugs in walkways, pet beds, electrical cords, etc? No  Adequate lighting in your home to reduce risk of falls? No   ASSISTIVE DEVICES UTILIZED TO PREVENT FALLS:  Life alert? No  Use of a cane, Rebel [place] or w/c? No  Grab bars in the bathroom? No  Shower chair or bench in shower? Yes  Elevated toilet seat or a handicapped toilet? No   TIMED UP AND GO:  Was the test performed? No .  Length of time to ambulate 10 feet: NA sec.     Cognitive Function:     6CIT Screen 11/13/2019 11/11/2018 11/08/2017 06/04/2016  What Year? 0 points 0 points 0 points 0 points  What month? 0 points 0 points 0 points 0 points  What time? 0 points 0 points 0 points 0 points  Count back from 20 0 points 0 points 0 points 0 points  Months in reverse 0 points 4 points 0 points 0 points  Repeat phrase - 2 points 2 points 0 points  Total Score - 6 2 0    Immunizations Immunization History  Administered Date(s) Administered   Influenza Split 10/30/2010, 10/09/2011   Influenza,inj,Quad PF,6+ Mos 11/14/2012, 12/13/2013, 09/18/2014, 10/02/2015, 11/23/2016, 11/08/2017, 09/08/2018, 10/05/2019, 11/12/2020   Moderna Sars-Covid-2 Vaccination  05/23/2019, 06/23/2019, 12/24/2019   PNEUMOCOCCAL CONJUGATE-20 11/14/2020   Pneumococcal Polysaccharide-23 08/21/2010   Td 06/20/2001   Tdap 08/21/2010    TDAP status: Due, Education has been provided regarding the importance of this vaccine. Advised may receive this vaccine at local pharmacy or Health Dept. Aware to provide a copy of the vaccination record if obtained from local pharmacy or Health Dept. Verbalized  acceptance and understanding.  Flu Vaccine status: Up to date  Pneumococcal vaccine status: Up to date  Covid-19 vaccine status: Completed vaccines  Qualifies for Shingles Vaccine? No   Zostavax completed No     Screening Tests Health Maintenance  Topic Date Due   COVID-19 Vaccine (4 - Booster for Moderna series) 02/18/2020   TETANUS/TDAP  08/20/2020   MAMMOGRAM  07/30/2021   PAP SMEAR-Modifier  07/02/2023   Pneumococcal Vaccine 31-2 Years old  Completed   INFLUENZA VACCINE  Completed   Hepatitis C Screening  Completed   HIV Screening  Completed   HPV VACCINES  Aged Out    Health Maintenance  Health Maintenance Due  Topic Date Due   COVID-19 Vaccine (4 - Booster for Moderna series) 02/18/2020   TETANUS/TDAP  08/20/2020      Mammogram status: Completed 07/30/2020. Repeat every year    Lung Cancer Screening: (Low Dose CT Chest recommended if Age 35-80 years, 30 pack-year currently smoking OR have quit w/in 15years.) does not qualify.   Lung Cancer Screening Referral: NA  Additional Screening:  Hepatitis C Screening: does qualify; Completed 06/19/2011  Vision Screening: Recommended annual ophthalmology exams for early detection of glaucoma and other disorders of the eye. Is the patient up to date with their annual eye exam?  No  Who is the provider or what is the name of the office in which the patient attends annual eye exams? Patient could not recall who she sees at time of visit If pt is not established with a provider, would they like to be referred to a provider to establish care? No .   Dental Screening: Recommended annual dental exams for proper oral hygiene  Community Resource Referral / Chronic Care Management: CRR required this visit?  No   CCM required this visit?  No      Plan:     I have personally reviewed and noted the following in the patient's chart:   Medical and social history Use of alcohol, tobacco or illicit drugs  Current  medications and supplements including opioid prescriptions.  Functional ability and status Nutritional status Physical activity Advanced directives List of other physicians Hospitalizations, surgeries, and ER visits in previous 12 months Vitals Screenings to include cognitive, depression, and falls Referrals and appointments  In addition, I have reviewed and discussed with patient certain preventive protocols, quality metrics, and best practice recommendations. A written personalized care plan for preventive services as well as general preventive health recommendations were provided to patient.     Danella Penton, [place]   11/22/2020   Nurse Notes: This was a telehealth visit. The patient was at home, the provider was in the office. [name], MD

## 2020-11-22 NOTE — Patient Instructions (Signed)
Tracey Daniel , Thank you for taking time to come for your Medicare Wellness Visit. I appreciate your ongoing commitment to your health goals. Please review the following plan we discussed and let me know if I can assist you in the future.   Screening recommendations/referrals: Mammogram: Completed 07/30/2020 Recommended yearly ophthalmology/optometry visit for glaucoma screening and checkup Recommended yearly dental visit for hygiene and checkup  Vaccinations: Influenza vaccine: Up to date Pneumococcal vaccine: Up to date Tdap vaccine: Due now Shingles vaccine: Does not qualify    Advanced directives: Information discussed, will mail Advanced Directive packet to patient.  Conditions/risks identified: Hyperlipidemia  Next appointment: 1 year  Preventive Care 40-64 Years, Female Preventive care refers to lifestyle choices and visits with your health care provider that can promote health and wellness. What does preventive care include? A yearly physical exam. This is also called an annual well check. Dental exams once or twice a year. Routine eye exams. Ask your health care provider how often you should have your eyes checked. Personal lifestyle choices, including: Daily care of your teeth and gums. Regular physical activity. Eating a healthy diet. Avoiding tobacco and drug use. Limiting alcohol use. Practicing safe sex. Taking low-dose aspirin daily starting at age 75. Taking vitamin and mineral supplements as recommended by your health care provider. What happens during an annual well check? The services and screenings done by your health care provider during your annual well check will depend on your age, overall health, lifestyle risk factors, and family history of disease. Counseling  Your health care provider may ask you questions about your: Alcohol use. Tobacco use. Drug use. Emotional well-being. Home and relationship well-being. Sexual activity. Eating  habits. Work and work Statistician. Method of birth control. Menstrual cycle. Pregnancy history. Screening  You may have the following tests or measurements: Height, weight, and BMI. Blood pressure. Lipid and cholesterol levels. These may be checked every 5 years, or more frequently if you are over 28 years old. Skin check. Lung cancer screening. You may have this screening every year starting at age 44 if you have a 30-pack-year history of smoking and currently smoke or have quit within the past 15 years. Fecal occult blood test (FOBT) of the stool. You may have this test every year starting at age 78. Flexible sigmoidoscopy or colonoscopy. You may have a sigmoidoscopy every 5 years or a colonoscopy every 10 years starting at age 75. Hepatitis C blood test. Hepatitis B blood test. Sexually transmitted disease (STD) testing. Diabetes screening. This is done by checking your blood sugar (glucose) after you have not eaten for a while (fasting). You may have this done every 1-3 years. Mammogram. This may be done every 1-2 years. Talk to your health care provider about when you should start having regular mammograms. This may depend on whether you have a family history of breast cancer. BRCA-related cancer screening. This may be done if you have a family history of breast, ovarian, tubal, or peritoneal cancers. Pelvic exam and Pap test. This may be done every 3 years starting at age 52. Starting at age 71, this may be done every 5 years if you have a Pap test in combination with an HPV test. Bone density scan. This is done to screen for osteoporosis. You may have this scan if you are at high risk for osteoporosis. Discuss your test results, treatment options, and if necessary, the need for more tests with your health care provider. Vaccines  Your health care provider may  recommend certain vaccines, such as: Influenza vaccine. This is recommended every year. Tetanus, diphtheria, and acellular  pertussis (Tdap, Td) vaccine. You may need a Td booster every 10 years. Zoster vaccine. You may need this after age 65. Pneumococcal 13-valent conjugate (PCV13) vaccine. You may need this if you have certain conditions and were not previously vaccinated. Pneumococcal polysaccharide (PPSV23) vaccine. You may need one or two doses if you smoke cigarettes or if you have certain conditions. Talk to your health care provider about which screenings and vaccines you need and how often you need them. This information is not intended to replace advice given to you by your health care provider. Make sure you discuss any questions you have with your health care provider. Document Released: 02/01/2015 Document Revised: 09/25/2015 Document Reviewed: 11/06/2014 Elsevier Interactive Patient Education  2017 [place] Prevention in the Home Falls can cause injuries. They can happen to people of all ages. There are many things you can do to make your home safe and to help prevent falls. What can I do on the outside of my home? Regularly fix the edges of walkways and driveways and fix any cracks. Remove anything that might make you trip as you walk through a door, such as a raised step or threshold. Trim any bushes or trees on the path to your home. Use bright outdoor lighting. Clear any walking paths of anything that might make someone trip, such as rocks or tools. Regularly check to see if handrails are loose or broken. Make sure that both sides of any steps have handrails. Any raised decks and porches should have guardrails on the edges. Have any leaves, snow, or ice cleared regularly. Use sand or salt on walking paths during winter. Clean up any spills in your garage right away. This includes oil or grease spills. What can I do in the bathroom? Use night lights. Install grab bars by the toilet and in the tub and shower. Do not use towel bars as grab bars. Use non-skid mats or decals in the  tub or shower. If you need to sit down in the shower, use a plastic, non-slip stool. Keep the floor dry. Clean up any water that spills on the floor as soon as it happens. Remove soap buildup in the tub or shower regularly. Attach bath mats securely with double-sided non-slip rug tape. Do not have throw rugs and other things on the floor that can make you trip. What can I do in the bedroom? Use night lights. Make sure that you have a light by your bed that is easy to reach. Do not use any sheets or blankets that are too big for your bed. They should not hang down onto the floor. Have a firm chair that has side arms. You can use this for support while you get dressed. Do not have throw rugs and other things on the floor that can make you trip. What can I do in the kitchen? Clean up any spills right away. Avoid walking on wet floors. Keep items that you use a lot in easy-to-reach places. If you need to reach something above you, use a strong step stool that has a grab bar. Keep electrical cords out of the way. Do not use floor polish or wax that makes floors slippery. If you must use wax, use non-skid floor wax. Do not have throw rugs and other things on the floor that can make you trip. What can I do with my  stairs? Do not leave any items on the stairs. Make sure that there are handrails on both sides of the stairs and use them. Fix handrails that are broken or loose. Make sure that handrails are as long as the stairways. Check any carpeting to make sure that it is firmly attached to the stairs. Fix any carpet that is loose or worn. Avoid having throw rugs at the top or bottom of the stairs. If you do have throw rugs, attach them to the floor with carpet tape. Make sure that you have a light switch at the top of the stairs and the bottom of the stairs. If you do not have them, ask someone to add them for you. What else can I do to help prevent falls? Wear shoes that: Do not have high  heels. Have rubber bottoms. Are comfortable and fit you well. Are closed at the toe. Do not wear sandals. If you use a stepladder: Make sure that it is fully opened. Do not climb a closed stepladder. Make sure that both sides of the stepladder are locked into place. Ask someone to hold it for you, if possible. Clearly mark and make sure that you can see: Any grab bars or handrails. First and last steps. Where the edge of each step is. Use tools that help you move around (mobility aids) if they are needed. These include: Canes. Walkers. Scooters. Crutches. Turn on the lights when you go into a dark area. Replace any light bulbs as soon as they burn out. Set up your furniture so you have a clear path. Avoid moving your furniture around. If any of your floors are uneven, fix them. If there are any pets around you, be aware of where they are. Review your medicines with your doctor. Some medicines can make you feel dizzy. This can increase your chance of falling. Ask your doctor what other things that you can do to help prevent falls. This information is not intended to replace advice given to you by your health care provider. Make sure you discuss any questions you have with your health care provider. Document Released: 11/01/2008 Document Revised: 06/13/2015 Document Reviewed: 02/09/2014 Elsevier Interactive Patient Education  2017 [employer].

## 2020-11-27 ENCOUNTER — Other Ambulatory Visit: Payer: Self-pay

## 2020-11-27 MED ORDER — ONDANSETRON 4 MG PO TBDP: 4.0000 mg | ORAL_TABLET | Freq: Three times a day (TID) | ORAL | 0 refills | Status: DC | PRN

## 2020-12-02 ENCOUNTER — Other Ambulatory Visit: Payer: Self-pay | Admitting: Family Medicine

## 2020-12-02 ENCOUNTER — Other Ambulatory Visit: Payer: Self-pay | Admitting: Nurse Practitioner

## 2020-12-02 DIAGNOSIS — K219 Gastro-esophageal reflux disease without esophagitis: Secondary | ICD-10-CM

## 2020-12-03 DIAGNOSIS — M791 Myalgia, unspecified site: Secondary | ICD-10-CM | POA: Diagnosis not present

## 2020-12-03 DIAGNOSIS — G43719 Chronic migraine without aura, intractable, without status migrainosus: Secondary | ICD-10-CM | POA: Diagnosis not present

## 2020-12-03 DIAGNOSIS — M542 Cervicalgia: Secondary | ICD-10-CM | POA: Diagnosis not present

## 2020-12-03 DIAGNOSIS — G518 Other disorders of facial nerve: Secondary | ICD-10-CM | POA: Diagnosis not present

## 2020-12-04 ENCOUNTER — Other Ambulatory Visit: Payer: Self-pay | Admitting: Family Medicine

## 2020-12-04 ENCOUNTER — Telehealth: Payer: Self-pay | Admitting: Family Medicine

## 2020-12-04 NOTE — Telephone Encounter (Signed)
Not on current med list- Looks like Elonda Husky took it off at the 9/20 visit as completed course. Please advise

## 2020-12-04 NOTE — Telephone Encounter (Signed)
Pt called in for refills on  MEGESTROL ?

## 2020-12-05 ENCOUNTER — Telehealth: Payer: Self-pay | Admitting: Obstetrics & Gynecology

## 2020-12-05 MED ORDER — MEGESTROL ACETATE 40 MG PO TABS
ORAL_TABLET | ORAL | 11 refills | Status: DC
Start: 2020-12-05 — End: 2021-07-21

## 2020-12-05 NOTE — Telephone Encounter (Signed)
Pt aware.

## 2020-12-05 NOTE — Telephone Encounter (Signed)
Patient says you discontinued megace and she said she needs it to be able to eat.

## 2020-12-05 NOTE — Telephone Encounter (Signed)
Called pt to relay Dr Brynda Greathouse recommendations. Pt confirmed understanding.

## 2020-12-18 ENCOUNTER — Other Ambulatory Visit: Payer: Self-pay | Admitting: Family Medicine

## 2020-12-23 ENCOUNTER — Other Ambulatory Visit: Payer: Self-pay | Admitting: Gastroenterology

## 2020-12-23 DIAGNOSIS — K3184 Gastroparesis: Secondary | ICD-10-CM

## 2020-12-25 DIAGNOSIS — F333 Major depressive disorder, recurrent, severe with psychotic symptoms: Secondary | ICD-10-CM | POA: Diagnosis not present

## 2021-01-16 DIAGNOSIS — M791 Myalgia, unspecified site: Secondary | ICD-10-CM | POA: Diagnosis not present

## 2021-01-16 DIAGNOSIS — G518 Other disorders of facial nerve: Secondary | ICD-10-CM | POA: Diagnosis not present

## 2021-01-16 DIAGNOSIS — M542 Cervicalgia: Secondary | ICD-10-CM | POA: Diagnosis not present

## 2021-01-16 DIAGNOSIS — G43719 Chronic migraine without aura, intractable, without status migrainosus: Secondary | ICD-10-CM | POA: Diagnosis not present

## 2021-02-05 ENCOUNTER — Ambulatory Visit (INDEPENDENT_AMBULATORY_CARE_PROVIDER_SITE_OTHER): Payer: Medicare HMO | Admitting: Dermatology

## 2021-02-05 ENCOUNTER — Other Ambulatory Visit: Payer: Self-pay

## 2021-02-05 ENCOUNTER — Encounter: Payer: Self-pay | Admitting: Dermatology

## 2021-02-05 DIAGNOSIS — L668 Other cicatricial alopecia: Secondary | ICD-10-CM | POA: Diagnosis not present

## 2021-02-05 DIAGNOSIS — L920 Granuloma annulare: Secondary | ICD-10-CM

## 2021-02-05 DIAGNOSIS — L669 Cicatricial alopecia, unspecified: Secondary | ICD-10-CM

## 2021-02-05 MED ORDER — CLOBETASOL PROPIONATE 0.05 % EX FOAM: Freq: Two times a day (BID) | CUTANEOUS | 11 refills | Status: DC

## 2021-02-05 MED ORDER — MINOCYCLINE HCL 50 MG PO CAPS: 50.0000 mg | ORAL_CAPSULE | Freq: Two times a day (BID) | ORAL | 11 refills | Status: DC

## 2021-02-27 DIAGNOSIS — G518 Other disorders of facial nerve: Secondary | ICD-10-CM | POA: Diagnosis not present

## 2021-02-27 DIAGNOSIS — M791 Myalgia, unspecified site: Secondary | ICD-10-CM | POA: Diagnosis not present

## 2021-02-27 DIAGNOSIS — G43719 Chronic migraine without aura, intractable, without status migrainosus: Secondary | ICD-10-CM | POA: Diagnosis not present

## 2021-02-27 DIAGNOSIS — M542 Cervicalgia: Secondary | ICD-10-CM | POA: Diagnosis not present

## 2021-02-28 ENCOUNTER — Encounter: Payer: Self-pay | Admitting: Dermatology

## 2021-02-28 NOTE — Progress Notes (Signed)
° °  Follow-Up Visit   Subjective  Tracey Daniel is a 41 y.o. female who presents for the following: Follow-up (Patient here today for follow up on central centrifugal scarring alopecia. Per patient the clobetasol foam and the oral minocycline are helping some. Per patient there is some irritation and tingling sensation at times. ).  Recheck scalp Location:  Duration:  Quality:  Associated Signs/Symptoms: Modifying Factors:  Severity:  Timing: Context:   Objective  Well appearing patient in no apparent distress; mood and affect are within normal limits. Mid Occipital Scalp, Mid Parietal Scalp No active inflammation, pustules, edema or tenderness.  Perhaps 30% decreased density with follicular dropout.    A focused examination was performed including head and neck. Relevant physical exam findings are noted in the Assessment and Plan.   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  Call me with any issues.  clobetasol (OLUX) 0.05 % topical foam - Mid Occipital Scalp, Mid Parietal Scalp Apply topically 2 (two) times daily.  minocycline (MINOCIN) 50 MG capsule - Mid Occipital Scalp, Mid Parietal Scalp Take 1 capsule (50 mg total) by mouth 2 (two) times daily.  Granuloma annulare      I, Lavonna Monarch, MD, have reviewed all documentation for this visit.  The documentation on 02/28/21 for the exam, diagnosis, procedures, and orders are all accurate and complete.

## 2021-03-18 ENCOUNTER — Ambulatory Visit (INDEPENDENT_AMBULATORY_CARE_PROVIDER_SITE_OTHER): Payer: Medicare HMO | Admitting: Family Medicine

## 2021-03-18 ENCOUNTER — Encounter: Payer: Self-pay | Admitting: Family Medicine

## 2021-03-18 ENCOUNTER — Other Ambulatory Visit: Payer: Self-pay

## 2021-03-18 VITALS — BP 134/79 | HR 74 | Resp 16 | Ht 66.0 in | Wt 162.4 lb

## 2021-03-18 DIAGNOSIS — F324 Major depressive disorder, single episode, in partial remission: Secondary | ICD-10-CM | POA: Diagnosis not present

## 2021-03-18 DIAGNOSIS — Z1231 Encounter for screening mammogram for malignant neoplasm of breast: Secondary | ICD-10-CM | POA: Diagnosis not present

## 2021-03-18 DIAGNOSIS — G44009 Cluster headache syndrome, unspecified, not intractable: Secondary | ICD-10-CM

## 2021-03-18 DIAGNOSIS — E782 Mixed hyperlipidemia: Secondary | ICD-10-CM | POA: Diagnosis not present

## 2021-03-18 DIAGNOSIS — F17218 Nicotine dependence, cigarettes, with other nicotine-induced disorders: Secondary | ICD-10-CM | POA: Diagnosis not present

## 2021-03-18 DIAGNOSIS — E559 Vitamin D deficiency, unspecified: Secondary | ICD-10-CM | POA: Diagnosis not present

## 2021-03-18 DIAGNOSIS — J302 Other seasonal allergic rhinitis: Secondary | ICD-10-CM

## 2021-03-18 DIAGNOSIS — K219 Gastro-esophageal reflux disease without esophagitis: Secondary | ICD-10-CM | POA: Diagnosis not present

## 2021-03-18 NOTE — Progress Notes (Signed)
Tracey Daniel     MRN: [id number]      DOB: Dec 19, 1980   HPI Tracey Daniel is here for follow up and re-evaluation of chronic medical conditions, medication management and review of any available recent lab and radiology data.  Preventive health is updated, specifically  Cancer screening and Immunization.   Questions or concerns regarding consultations or procedures which the PT has had in the interim are  addressed. The PT denies any adverse reactions to current medications since the last visit.  Increased stress as niece missing x 1 month in [place] Denies recent fever or chills. Denies sinus pressure, nasal congestion, ear pain or sore throat. Denies chest congestion, productive cough or wheezing. Denies chest pains, palpitations and leg swelling Denies abdominal pain, nausea, vomiting,diarrhea or constipation.   Denies dysuria, frequency, hesitancy or incontinence. Denies joint pain, swelling and limitation in mobility. Denies headaches, seizures, numbness, or tingling. Denies skin break down or rash.   PE  BP 134/79    Pulse 74    Resp 16    Ht 5\' 6"  (1.676 m)    Wt 162 lb 6.4 oz (73.7 kg)    LMP  (LMP Unknown) Comment: pt reports premature menopause   SpO2 98%    BMI 26.21 kg/m   Patient alert and oriented and in no cardiopulmonary distress.  HEENT: No facial asymmetry, EOMI,     Neck supple .  Chest: Clear to auscultation bilaterally.  CVS: S1, S2 no murmurs, no S3.Regular rate.  ABD: Soft non tender.   Ext: No edema  MS: Adequate ROM spine, shoulders, hips and knees.  Skin: Intact, no ulcerations or rash noted.  Psych: Good eye contact, normal affect. Memory intact not anxious or depressed appearing.  CNS: CN 2-12 intact, power,  normal throughout.no focal deficits noted.   Assessment & Plan  Nicotine dependence, cigarettes, with other nicotine-induced disorders Asked:confirms currently smokes cigarettes Assess: Unwilling to set a quit  date, but is cutting back Advise: needs to QUIT to reduce risk of cancer, cardio and cerebrovascular disease Assist: counseled for 5 minutes and literature provided Arrange: follow up in 2 to 4 months   Seasonal allergies Controlled, no change in medication   Hyperlipemia Hyperlipidemia:Low fat diet discussed and encouraged.   Lipid Panel  Lab Results  Component Value Date   CHOL 124 11/12/2020   HDL 48 11/12/2020   LDLCALC 45 11/12/2020   TRIG 195 (H) 11/12/2020   CHOLHDL 2.6 11/12/2020     Updated lab needed at/ before next visit.   Gastroesophageal reflux disease without esophagitis Controlled and managed by GI  Headache Controlled on current meds, not experiencing multiple headaches  Depression Controlled, needs  To find Psychiatrist  Vitamin D deficiency Updated lab needed at/ before next visit.    Tracey Daniel     MRN: [id number]      DOB: [birth year]   HPI Tracey Daniel is here for follow up and re-evaluation of chronic medical conditions, medication management and review of any available recent lab and radiology data.  Preventive health is updated, specifically  Cancer screening and Immunization.   Questions or concerns regarding consultations or procedures which the PT has had in the interim are  addressed. The PT denies any adverse reactions to current medications since the last visit.  There are no new concerns.  There are no specific complaints   ROS Denies recent fever or chills. Denies sinus pressure, nasal congestion, ear pain or sore  throat. Denies chest congestion, productive cough or wheezing. Denies chest pains, palpitations and leg swelling Denies abdominal pain, nausea, vomiting,diarrhea or constipation.   Denies dysuria, frequency, hesitancy or incontinence. Denies joint pain, swelling and limitation in mobility. Denies headaches, seizures, numbness, or tingling. Denies depression, anxiety or insomnia. Denies skin break down or  rash.   PE  BP 134/79    Pulse 74    Resp 16    Ht 5\' 6"  (1.676 m)    Wt 162 lb 6.4 oz (73.7 kg)    LMP  (LMP Unknown) Comment: pt reports premature menopause   SpO2 98%    BMI 26.21 kg/m   Patient alert and oriented and in no cardiopulmonary distress.  HEENT: No facial asymmetry, EOMI,     Neck supple .  Chest: Clear to auscultation bilaterally.  CVS: S1, S2 no murmurs, no S3.Regular rate.  ABD: Soft non tender.   Ext: No edema  MS: Adequate ROM spine, shoulders, hips and knees.  Skin: Intact, no ulcerations or rash noted.  Psych: Good eye contact, normal affect. Memory intact not anxious or depressed appearing.  CNS: CN 2-12 intact, power,  normal throughout.no focal deficits noted.   Assessment & Plan  Nicotine dependence, cigarettes, with other nicotine-induced disorders Asked:confirms currently smokes cigarettes Assess: Unwilling to set a quit date, but is cutting back Advise: needs to QUIT to reduce risk of cancer, cardio and cerebrovascular disease Assist: counseled for 5 minutes and literature provided Arrange: follow up in 2 to 4 months   Seasonal allergies Controlled, no change in medication   Hyperlipemia Hyperlipidemia:Low fat diet discussed and encouraged.   Lipid Panel  Lab Results  Component Value Date   CHOL 124 11/12/2020   HDL 48 11/12/2020   LDLCALC 45 11/12/2020   TRIG 195 (H) 11/12/2020   CHOLHDL 2.6 11/12/2020     Updated lab needed at/ before next visit.   Gastroesophageal reflux disease without esophagitis Controlled and managed by GI  Headache Controlled on current meds, not experiencing multiple headaches  Depression Controlled, needs  To find Psychiatrist  Vitamin D deficiency Updated lab needed at/ before next visit.

## 2021-03-18 NOTE — Assessment & Plan Note (Signed)
Asked:confirms currently smokes cigarettes °Assess: Unwilling to set a quit date, but is cutting back °Advise: needs to QUIT to reduce risk of cancer, cardio and cerebrovascular disease °Assist: counseled for 5 minutes and literature provided °Arrange: follow up in 2 to 4 months ° °

## 2021-03-18 NOTE — Assessment & Plan Note (Signed)
Updated lab needed at/ before next visit.   

## 2021-03-18 NOTE — Assessment & Plan Note (Signed)
Hyperlipidemia:Low fat diet discussed and encouraged.   Lipid Panel  Lab Results  Component Value Date   CHOL 124 11/12/2020   HDL 48 11/12/2020   LDLCALC 45 11/12/2020   TRIG 195 (H) 11/12/2020   CHOLHDL 2.6 11/12/2020     Updated lab needed at/ before next visit.

## 2021-03-18 NOTE — Assessment & Plan Note (Signed)
Controlled, no change in medication  

## 2021-03-18 NOTE — Patient Instructions (Addendum)
Annual exam early June, call if you need me sooner    Lipid, cmp and eGR today  Please schedule mammogram at checkout  You need to stop smoking to improve your health and reduce your isk of cancer, stroke and heart disease  Call [email or web address] for help  Thanks for choosing [facility], we consider it a privelige to serve you.

## 2021-03-18 NOTE — Assessment & Plan Note (Signed)
Controlled, needs  To find Psychiatrist

## 2021-03-18 NOTE — Assessment & Plan Note (Signed)
Controlled and managed by GI

## 2021-03-18 NOTE — Assessment & Plan Note (Signed)
Controlled on current meds, not experiencing multiple headaches

## 2021-03-19 ENCOUNTER — Other Ambulatory Visit: Payer: Self-pay | Admitting: Family Medicine

## 2021-03-19 LAB — CMP14+EGFR
ALT: 32 IU/L (ref 0–32)
AST: 28 IU/L (ref 0–40)
Albumin/Globulin Ratio: 2.3 — ABNORMAL HIGH (ref 1.2–2.2)
Albumin: 5 g/dL — ABNORMAL HIGH (ref 3.8–4.8)
Alkaline Phosphatase: 103 IU/L (ref 44–121)
BUN/Creatinine Ratio: 26 — ABNORMAL HIGH (ref 9–23)
BUN: 22 mg/dL (ref 6–24)
Bilirubin Total: 0.3 mg/dL (ref 0.0–1.2)
CO2: 20 mmol/L (ref 20–29)
Calcium: 9.9 mg/dL (ref 8.7–10.2)
Chloride: 102 mmol/L (ref 96–106)
Creatinine, Ser: 0.86 mg/dL (ref 0.57–1.00)
Globulin, Total: 2.2 g/dL (ref 1.5–4.5)
Glucose: 84 mg/dL (ref 70–99)
Potassium: 4.4 mmol/L (ref 3.5–5.2)
Sodium: 137 mmol/L (ref 134–144)
Total Protein: 7.2 g/dL (ref 6.0–8.5)
eGFR: 86 mL/min/{1.73_m2} (ref >59)

## 2021-03-19 LAB — LIPID PANEL
Chol/HDL Ratio: 3 ratio (ref 0.0–4.4)
Cholesterol, Total: 142 mg/dL (ref 100–199)
HDL: 47 mg/dL (ref >39)
LDL Chol Calc (NIH): 58 mg/dL (ref 0–99)
Triglycerides: 233 mg/dL — ABNORMAL HIGH (ref 0–149)
VLDL Cholesterol Cal: 37 mg/dL (ref 5–40)

## 2021-03-19 MED ORDER — FENOFIBRATE 48 MG PO TABS: 48.0000 mg | ORAL_TABLET | Freq: Every day | ORAL | 5 refills | Status: DC

## 2021-03-21 DIAGNOSIS — F251 Schizoaffective disorder, depressive type: Secondary | ICD-10-CM | POA: Diagnosis not present

## 2021-03-27 ENCOUNTER — Ambulatory Visit: Payer: Medicare HMO | Admitting: Gastroenterology

## 2021-04-07 DIAGNOSIS — G518 Other disorders of facial nerve: Secondary | ICD-10-CM | POA: Diagnosis not present

## 2021-04-07 DIAGNOSIS — G43719 Chronic migraine without aura, intractable, without status migrainosus: Secondary | ICD-10-CM | POA: Diagnosis not present

## 2021-04-07 DIAGNOSIS — M542 Cervicalgia: Secondary | ICD-10-CM | POA: Diagnosis not present

## 2021-04-07 DIAGNOSIS — M791 Myalgia, unspecified site: Secondary | ICD-10-CM | POA: Diagnosis not present

## 2021-04-26 NOTE — Progress Notes (Signed)
? ? ?Referring Provider: [name], MD ?Primary Care Physician:  [name], MD ?Primary GI Physician: Dr. Gala Romney ? ?Chief Complaint  ?Patient presents with  ? Follow-up  ? ? ?HPI:   ?Tracey Daniel is a 41 y.o. female presenting today for follow-up.  She has history of GERD, gastroparesis, gastric outlet obstruction secondary to recurrent pylorospasm/pyloric stenosis requiring repeat EGDs with dilation/Botox injections with last in 2020, and history of constipation. ? ?GES in 2012 abnormal. Repeat GES July 2020 ordered by general surgeon Michael Boston, MD was normal.  Dr. Johney Maine recommended esophageal manometry to rule out any esophageal dysmotility or other issues that may explain nausea/retching.  Hesitant to pursue additional intervention such as hiatal hernia repair with fundoplication and pyloroplasty without making sure all other etiologies/options have been exhausted.  Esophageal manometry September 2020 normal. Patient hasn't been back to see Dr. Johney Maine.  ? ?Last seen in our office 09/25/2020.  GERD was well controlled on Protonix 40 mg twice daily.  Also taking Carafate as needed for stomach burning which worked well.  She was taking Reglan 10 mg twice daily for gastroparesis which was working very well for her.   No side effects.  She had made dietary changes including avoiding fried/fatty foods, sweets, sodas, potato chips, and also decreased Ensure to once daily. Constipation well controlled on Linzess 145 mcg daily without alarm symptoms.  Discussed trial of decreasing Reglan to 5 mg twice daily, patient was agreeable.  Also encouraged she resume Ensure twice daily as she ate only 2 meals at baseline, continue other medications, follow-up in 6 months or sooner if needed. ? ?Today:  ? ?Gastroparesis:  ?Doing well on Reglan 5 mg twice daily.  Denies nausea or vomiting.  Weight has been stable/increasing.  She is gained 7 pounds since her last visit.  Drinking Ensure once daily and  eating 2 meals a day which is her baseline. ? ?GERD:  ?Fairly well-controlled on Protonix 40 mg twice daily.  Breakthrough symptoms a couple times a week when eating spicy sausage.  Uses Carafate as needed for breakthrough.  No dysphagia. ? ?Constipation:  ?Well-controlled on Linzess 145 mcg a few days a week.  Denies BRBPR or melena. ? ? ?Not sure if her dad had colon cancer. Current alive, age 18.  ? ? ?Past Medical History:  ?Diagnosis Date  ? Alopecia   ? Anemia of other chronic disease 11/29/2012  ? Asthma   ? Back pain, chronic   ? Chronic abdominal pain   ? Constipation   ? COPD (chronic obstructive pulmonary disease) ([place])   ? Cyst of skin   ? mid spine area  ? Depression 2000  ? h/o suicidal ideation  ? Gastric outlet obstruction   ? Gastritis   ? Gastroparesis   ? GERD (gastroesophageal reflux disease)   ? LGSIL of cervix of undetermined significance 07/08/2020  ? 07/08/20 pt aware will get colpo, per [facility]  ? Migraine   ? Migraines   ? Nausea and vomiting   ? recurrent  ? Nicotine addiction   ? Osteoporosis   ? Peptic ulcer disease   ? Pyloric spasm 03/30/2011  ? Seasonal allergies   ? Sinusitis   ? Substance abuse ([place]) 2008  ? marijuana  ? Vitamin D deficiency   ? ? ?Past Surgical History:  ?Procedure Laterality Date  ? BALLOON DILATION N/A 02/09/2013  ? Procedure: BALLOON DILATION;  Surgeon: Missy Sabins, MD;  Location: [facility];  Service: Endoscopy;  Laterality: N/A;  ? BALLOON DILATION N/A 03/10/2018  ? Procedure: BALLOON DILATION;  Surgeon: Daneil Dolin, MD;  [facility];  Service: Endoscopy;  Laterality: N/A;  ? BILIARY DILATION  06/01/2018  ? Procedure: PYLORIC DILATION;  Surgeon: Irving Copas., MD;  Location: [place];  Service: Gastroenterology;;  ? BIOPSY  06/01/2018  ? Procedure: BIOPSY;  Surgeon: Irving Copas., MD;  Location: [place];  Service: Gastroenterology;;  ? BOTOX INJECTION N/A 02/09/2013  ? Procedure: BOTOX INJECTION;  Surgeon: Missy Sabins, MD;  Location: [facility];  Service: Endoscopy;  Laterality: N/A;  possible balloon  ? BOTOX INJECTION N/A 10/24/2015  ? Procedure: BOTOX INJECTION;  Surgeon: Daneil Dolin, MD;  [facility];  Service: Endoscopy;  Laterality: N/A;  ? BOTOX INJECTION N/A 03/10/2018  ? Procedure: BOTOX INJECTION;  Surgeon: Daneil Dolin, MD;  [facility];  Service: Endoscopy;  Laterality: N/A;  Melanie notified  ? BOTOX INJECTION  06/01/2018  ? Procedure: BOTOX INJECTION;  Surgeon: Rush Landmark Telford Nab., MD;  Location: [place];  Service: Gastroenterology;;  ? CARPAL TUNNEL RELEASE    ? left hand  ? COLONOSCOPY WITH PROPOFOL N/A 09/20/2014  ? RMR: Normal ileo-colonoscopy  ? ESOPHAGEAL DILATION  12/25/2010  ? Procedure: ESOPHAGEAL DILATION;  Surgeon: Dorothyann Peng, MD;  [facility];  Service: Endoscopy;;  ? ESOPHAGEAL MANOMETRY N/A 10/03/2018  ? Surgeon: Mauri Pole, MD;  Normal.  ? ESOPHAGOGASTRODUODENOSCOPY  12/25/2010  ? Dorothyann Peng, MD;  moderate gastritis, ?goo secondary to pylorspasm. BX showed reactive gstropathy no h.pyori, SB mucosa with intramucosal lymphocytosis and partial villous blunting (TTG 4.0 normal)  ? ESOPHAGOGASTRODUODENOSCOPY N/A 11/14/2012  ? PZW:CHENI hiatal hernia. Abnormal gastric mucosa of  uncertain significance-status post biopsy. Subjectively, patient may have recurrent symptomatic, pylorospasm.  ? ESOPHAGOGASTRODUODENOSCOPY (EGD) WITH PROPOFOL N/A 02/09/2013  ? elongated stomach, partial lower esophageal ring widely patent. No obvious pyloric stenosis s/p Botox  ? ESOPHAGOGASTRODUODENOSCOPY (EGD) WITH PROPOFOL N/A 10/24/2015  ? Procedure: ESOPHAGOGASTRODUODENOSCOPY (EGD) WITH PROPOFOL;  Surgeon: Daneil Dolin, MD;  [facility];  Service: Endoscopy;  Laterality: N/A;  830 ?  ? ESOPHAGOGASTRODUODENOSCOPY (EGD) WITH PROPOFOL N/A 03/10/2018  ? Dr. Gala Romney: normal esophagus, elongated, dilated stomach likely stigmata of slow gastric  emptying due to narrowing of pyloric channel, s/p balloon dilatation of pyloric channel. Small hiatal hernia, normal duodenal bulb and second portion of duodenum  ? ESOPHAGOGASTRODUODENOSCOPY (EGD) WITH PROPOFOL N/A 06/01/2018  ? Surgeon: Irving Copas., MD; normal esophagus s/p biopsy, small hiatal hernia, erythematous mucosa in the cardia, gastric fundus, gastric body, erosive gastropathy s/p biopsy, normal pylorus s/p dilation and Botox injection, normal duodenum s/p biopsy.  Surgical pathology all benign.  ? LAPAROSCOPIC CHOLECYSTECTOMY  2002  ? [facility]ine Na?  [place]?  ? laser surgery on cervix    ? NASAL SEPTUM SURGERY  01/29/2017  ? TOE SURGERY    ? ? ?Current Outpatient Medications  ?Medication Sig Dispense Refill  ? acetaminophen (TYLENOL) 325 MG tablet Take 2 tablets (650 mg total) by mouth every 6 (six) hours as needed for mild pain (or Fever >/= 101). 20 tablet 0  ? azelastine (ASTELIN) 0.1 % nasal spray USE 2 SPRAYS IN EACH NOSTRIL TWICE DAILY AS DIRECTED 30 mL 2  ? budesonide-formoterol (SYMBICORT) 80-4.5 MCG/ACT inhaler Inhale 2 puffs into the lungs 2 (two) times daily. 1 Inhaler 3  ? clobetasol (OLUX) 0.05 % topical foam Apply topically 2 (two)  times daily. 50 g 11  ? clotrimazole-betamethasone (LOTRISONE) cream Apply sparingly to rash on face once daily for 1 week 15 g 0  ? diclofenac (VOLTAREN) 75 MG EC tablet TAKE ONE TABLET BY MOUTH ONCE DAILY FOR CHRONIC BACK PAIN (DOSE REDUCTION) 90 tablet 1  ? estradiol (ESTRACE) 2 MG tablet Take 1 tablet (2 mg total) by mouth daily. 30 tablet 11  ? feeding supplement (ENSURE CLINICAL STRENGTH) LIQD Take 237 mLs by mouth 3 (three) times daily with meals. 10 Bottle 3  ? fenofibrate (TRICOR) 48 MG tablet Take 1 tablet (48 mg total) by mouth daily. 30 tablet 5  ? fluticasone (FLONASE) 50 MCG/ACT nasal spray SHAKE LIQUID AND USE 2 SPRAYS IN EACH NOSTRIL DAILY 48 g 2  ? gabapentin (NEURONTIN) 100 MG capsule TAKE 1 CAPSULE(100 MG) BY MOUTH THREE TIMES DAILY  90 capsule 5  ? linaclotide (LINZESS) 145 MCG CAPS capsule Take 1 capsule (145 mcg total) by mouth daily before breakfast. 30 capsule 5  ? megestrol (MEGACE) 40 MG tablet 3 tablets a day for 5 days, 2 tablets

## 2021-04-28 ENCOUNTER — Encounter: Payer: Self-pay | Admitting: Gastroenterology

## 2021-04-28 ENCOUNTER — Ambulatory Visit (INDEPENDENT_AMBULATORY_CARE_PROVIDER_SITE_OTHER): Payer: Medicare HMO | Admitting: Gastroenterology

## 2021-04-28 VITALS — BP 128/76 | HR 80 | Temp 97.6°F | Ht 66.0 in | Wt 167.4 lb

## 2021-04-28 DIAGNOSIS — K3184 Gastroparesis: Secondary | ICD-10-CM | POA: Diagnosis not present

## 2021-04-28 DIAGNOSIS — K219 Gastro-esophageal reflux disease without esophagitis: Secondary | ICD-10-CM | POA: Diagnosis not present

## 2021-04-28 DIAGNOSIS — K5900 Constipation, unspecified: Secondary | ICD-10-CM | POA: Diagnosis not present

## 2021-04-28 MED ORDER — SUCRALFATE 1 G PO TABS: ORAL_TABLET | ORAL | 3 refills | Status: DC

## 2021-04-28 MED ORDER — METOCLOPRAMIDE HCL 5 MG PO TABS: 2.5000 mg | ORAL_TABLET | Freq: Two times a day (BID) | ORAL | 2 refills | Status: DC

## 2021-04-28 NOTE — Patient Instructions (Signed)
As you are doing very well, lets try decreasing Reglan to 2.5 mg twice daily before meals.  I have sent a new prescription for Reglan 5 mg to your pharmacy.  You will need to cut the tablet in half. ? ?Continue Protonix 40 mg twice daily 30 minutes before breakfast and dinner. ? ?To help with your breakthrough heartburn symptoms, please limit the spicy sausage you are eating. ? ?General GERD diet/lifestyle recommendations:  ?Avoid fried, fatty, greasy, spicy, citrus foods. ?Avoid caffeine and carbonated beverages. ?Avoid chocolate. ?Try eating 4-6 small meals a day rather than 3 large meals. ?Do not eat within 3 hours of laying down. ?Prop head of bed up on wood or bricks to create a 6 inch incline. ? ?Gastroparesis recommendations:  ?4-6 small meals daily ?Low fat diet ?Low fiber diet (avoid raw fruits and vegetables). ? ?Continue Linzess 145 mcg daily as needed for constipation. ? ?We will follow-up with you in about 6 months.  Do not hesitate to call if you have any questions or concerns prior to your next visit. ? ?It was great to see you again today!  I am glad you are doing very well! ? ?Aliene Altes, PA-C ?[facility] ? ?

## 2021-05-03 ENCOUNTER — Other Ambulatory Visit: Payer: Self-pay | Admitting: Family Medicine

## 2021-05-19 DIAGNOSIS — G518 Other disorders of facial nerve: Secondary | ICD-10-CM | POA: Diagnosis not present

## 2021-05-19 DIAGNOSIS — G43719 Chronic migraine without aura, intractable, without status migrainosus: Secondary | ICD-10-CM | POA: Diagnosis not present

## 2021-05-19 DIAGNOSIS — M542 Cervicalgia: Secondary | ICD-10-CM | POA: Diagnosis not present

## 2021-05-19 DIAGNOSIS — M791 Myalgia, unspecified site: Secondary | ICD-10-CM | POA: Diagnosis not present

## 2021-06-18 ENCOUNTER — Other Ambulatory Visit: Payer: Self-pay | Admitting: Family Medicine

## 2021-06-24 ENCOUNTER — Other Ambulatory Visit: Payer: Self-pay | Admitting: Family Medicine

## 2021-06-24 ENCOUNTER — Ambulatory Visit (INDEPENDENT_AMBULATORY_CARE_PROVIDER_SITE_OTHER): Payer: Medicare HMO | Admitting: Family Medicine

## 2021-06-24 ENCOUNTER — Encounter: Payer: Self-pay | Admitting: Family Medicine

## 2021-06-24 ENCOUNTER — Ambulatory Visit (HOSPITAL_COMMUNITY)
Admission: RE | Admit: 2021-06-24 | Discharge: 2021-06-24 | Disposition: A | Discharge status: Home / Self Care | Payer: Medicare HMO | Source: Ambulatory Visit | Attending: Family Medicine | Admitting: Family Medicine

## 2021-06-24 VITALS — BP 136/83 | HR 85 | Ht 66.0 in | Wt 165.0 lb

## 2021-06-24 DIAGNOSIS — J029 Acute pharyngitis, unspecified: Secondary | ICD-10-CM

## 2021-06-24 DIAGNOSIS — M25551 Pain in right hip: Secondary | ICD-10-CM

## 2021-06-24 DIAGNOSIS — E559 Vitamin D deficiency, unspecified: Secondary | ICD-10-CM | POA: Diagnosis not present

## 2021-06-24 DIAGNOSIS — F17210 Nicotine dependence, cigarettes, uncomplicated: Secondary | ICD-10-CM | POA: Diagnosis not present

## 2021-06-24 DIAGNOSIS — E782 Mixed hyperlipidemia: Secondary | ICD-10-CM | POA: Diagnosis not present

## 2021-06-24 DIAGNOSIS — J449 Chronic obstructive pulmonary disease, unspecified: Secondary | ICD-10-CM | POA: Diagnosis not present

## 2021-06-24 DIAGNOSIS — E2839 Other primary ovarian failure: Secondary | ICD-10-CM

## 2021-06-24 DIAGNOSIS — Z0001 Encounter for general adult medical examination with abnormal findings: Secondary | ICD-10-CM | POA: Insufficient documentation

## 2021-06-24 DIAGNOSIS — R87810 Cervical high risk human papillomavirus (HPV) DNA test positive: Secondary | ICD-10-CM

## 2021-06-24 DIAGNOSIS — Z8742 Personal history of other diseases of the female genital tract: Secondary | ICD-10-CM

## 2021-06-24 DIAGNOSIS — R87612 Low grade squamous intraepithelial lesion on cytologic smear of cervix (LGSIL): Secondary | ICD-10-CM

## 2021-06-24 DIAGNOSIS — J45991 Cough variant asthma: Secondary | ICD-10-CM

## 2021-06-24 DIAGNOSIS — G8929 Other chronic pain: Secondary | ICD-10-CM | POA: Diagnosis not present

## 2021-06-24 DIAGNOSIS — F17218 Nicotine dependence, cigarettes, with other nicotine-induced disorders: Secondary | ICD-10-CM

## 2021-06-24 MED ORDER — BUDESONIDE-FORMOTEROL FUMARATE 80-4.5 MCG/ACT IN AERO: 2.0000 | INHALATION_SPRAY | Freq: Two times a day (BID) | RESPIRATORY_TRACT | 4 refills | Status: DC

## 2021-06-24 MED ORDER — FLUTICASONE PROPIONATE 50 MCG/ACT NA SUSP: 2.0000 | Freq: Every day | NASAL | 6 refills | Status: DC

## 2021-06-24 MED ORDER — ALBUTEROL SULFATE HFA 108 (90 BASE) MCG/ACT IN AERS: 2.0000 | INHALATION_SPRAY | Freq: Four times a day (QID) | RESPIRATORY_TRACT | 5 refills | Status: DC | PRN

## 2021-06-24 MED ORDER — PREDNISONE 10 MG PO TABS: 10.0000 mg | ORAL_TABLET | Freq: Two times a day (BID) | ORAL | 0 refills | Status: DC

## 2021-06-24 MED ORDER — NICOTINE 14 MG/24HR TD PT24: 14.0000 mg | MEDICATED_PATCH | Freq: Every day | TRANSDERMAL | 0 refills | Status: DC

## 2021-06-24 MED ORDER — BUPROPION HCL ER (SR) 150 MG PO TB12: 150.0000 mg | ORAL_TABLET | Freq: Two times a day (BID) | ORAL | 5 refills | Status: DC

## 2021-06-24 MED ORDER — NICOTINE 21 MG/24HR TD PT24: 21.0000 mg | MEDICATED_PATCH | Freq: Every day | TRANSDERMAL | 0 refills | Status: DC

## 2021-06-24 NOTE — Assessment & Plan Note (Signed)

## 2021-06-24 NOTE — Assessment & Plan Note (Signed)
Refer for pelvic and pap

## 2021-06-24 NOTE — Assessment & Plan Note (Signed)
Uncontrolled, short course of prednisone prescribed and daily inhaler prescribed

## 2021-06-24 NOTE — Assessment & Plan Note (Signed)
RE EVAL WITH X RAY REPORTS ONGOING PAIN WITH LIMPING

## 2021-06-24 NOTE — Assessment & Plan Note (Addendum)
Asked:confirms currently smokes cigarettes, 10 to 15/ day Assess: willing to set a quit date,   is cutting back, and wants med to help Advise: needs to QUIT to reduce risk of cancer, cardio and cerebrovascular disease Assist: counseled for 5 minutes and literature provided Arrange: follow up in 2 to 4 months Wellbutrin and nicoderm prescriribed

## 2021-06-24 NOTE — Assessment & Plan Note (Signed)
worsenoing due to ongoing cigarette smoking, ready to quit and addressed at visit

## 2021-06-24 NOTE — Patient Instructions (Addendum)
F/u end July, re evaluate smoking   New to help with stopping smoking is wellbutrin twice daily, set quit date for 6/20, start nicoderm patches and stop smoking on 07/08/2021  INHALERS AND NASAL SPRAYS ARTE PRESCRIBED  Call 1800QUITNOW for help with smoking cessation  cBC, lipid, cmp and egfr, Tsh AND VIT d TODAY  X RAY OF RIGHT HIP TODAY , IF ABN YOU WILL BE REFERRED TO DR Ninfa Linden  PLEASE  SCHEDULE  DEXA  AT CHECKOUT  Thanks for choosing [facility] we consider it a privelige to serve you.

## 2021-06-24 NOTE — Assessment & Plan Note (Signed)
Throat swab negative for strep

## 2021-06-24 NOTE — Progress Notes (Signed)
    Tracey Daniel     MRN: [id number]      DOB: 1980-01-28  HPI: Patient is in for annual physical exam. 3 week h/o increased coughing and shortness of breath, no fever or chills Sore throat with brown area, no fever , chills or positive sick contact  Smokes over 10 /day, stressed, niece   missing for over 5 months, wants to quit, smoking cessation  addressed Immunization is reviewed , and  updated if needed. C/o right hip apin progressive and debilitating   PE: BP 136/83   Pulse 85   Ht '5\' 6"'$  (1.676 m)   Wt 165 lb 0.6 oz (74.9 kg)   LMP  (LMP Unknown) Comment: pt reports premature menopause  SpO2 98%   BMI 26.64 kg/m   Pleasant  female, alert and oriented x 3, in no cardio-pulmonary distress. Afebrile. HEENT No facial trauma or asymetry. Sinuses non tender.  Extra occullar muscles intact.. External ears normal, . Neck: supple, no adenopathy,JVD or thyromegaly.No bruits.  Chest: Decreased air entry , few wheezes  Cardiovascular system; Heart sounds normal,  S1 and  S2 ,no S3.  No murmur, or thrill. Apical beat not displaced Peripheral pulses normal.  Abdomen: Soft, non tender, no organomegaly or masses. No bruits. Bowel sounds normal. No guarding, tenderness or rebound.   .   Musculoskeletal exam: Full ROM of spine, DECREASED rom RIGTH HIP , normal in shoulders and knees. No deformity ,swelling or crepitus noted. No muscle wasting or atrophy.   Neurologic: Cranial nerves 2 to 12 intact. Power, tone ,sensation and reflexes normal throughout. No disturbance in gait. No tremor.  Skin: Intact, no ulceration, erythema , scaling or rash noted. Pigmentation normal throughout  Psych; Normal mood and affect. Judgement and concentration normal   Assessment & Plan:  Annual visit for general adult medical examination with abnormal findings Annual exam as documented. Counseling done  re healthy lifestyle involving commitment to 150 minutes exercise per week,  heart healthy diet, and attaining healthy weight.The importance of adequate sleep also discussed. Regular seat belt use and home safety, is also discussed. Changes in health habits are decided on by the patient with goals and time frames  set for achieving them. Immunization and cancer screening needs are specifically addressed at this visit.   Right hip pain RE EVAL WITH X RAY REPORTS ONGOING PAIN WITH LIMPING  Asthma, cough variant Uncontrolled, short course of prednisone prescribed and daily inhaler prescribed  COPD (chronic obstructive pulmonary disease) ([facility]) worsenoing due to ongoing cigarette smoking, ready to quit and addressed at visit  History of abnormal cervical Pap smear Refer for pelvic and pap  Nicotine dependence, cigarettes, with other nicotine-induced disorders Asked:confirms currently smokes cigarettes, 10 to 15/ day Assess: willing to set a quit date,   is cutting back, and wants med to help Advise: needs to QUIT to reduce risk of cancer, cardio and cerebrovascular disease Assist: counseled for 5 minutes and literature provided Arrange: follow up in 2 to 4 months Wellbutrin and nicoderm prescriribed   Pharyngitis Throat swab negative for strep

## 2021-06-25 LAB — CMP14+EGFR
ALT: 13 IU/L (ref 0–32)
AST: 14 IU/L (ref 0–40)
Albumin/Globulin Ratio: 1.7 (ref 1.2–2.2)
Albumin: 4.5 g/dL (ref 3.8–4.8)
Alkaline Phosphatase: 96 IU/L (ref 44–121)
BUN/Creatinine Ratio: 22 (ref 9–23)
BUN: 20 mg/dL (ref 6–24)
Bilirubin Total: 0.2 mg/dL (ref 0.0–1.2)
CO2: 21 mmol/L (ref 20–29)
Calcium: 9.6 mg/dL (ref 8.7–10.2)
Chloride: 101 mmol/L (ref 96–106)
Creatinine, Ser: 0.9 mg/dL (ref 0.57–1.00)
Globulin, Total: 2.6 g/dL (ref 1.5–4.5)
Glucose: 92 mg/dL (ref 70–99)
Potassium: 4.7 mmol/L (ref 3.5–5.2)
Sodium: 136 mmol/L (ref 134–144)
Total Protein: 7.1 g/dL (ref 6.0–8.5)
eGFR: 81 mL/min/{1.73_m2} (ref >59)

## 2021-06-25 LAB — CBC
Hematocrit: 34.3 % (ref 34.0–46.6)
Hemoglobin: 11.7 g/dL (ref 11.1–15.9)
MCH: 31 pg (ref 26.6–33.0)
MCHC: 34.1 g/dL (ref 31.5–35.7)
MCV: 91 fL (ref 79–97)
Platelets: 207 10*3/uL (ref 150–450)
RBC: 3.78 x10E6/uL (ref 3.77–5.28)
RDW: 11.8 % (ref 11.7–15.4)
WBC: 8.4 10*3/uL (ref 3.4–10.8)

## 2021-06-25 LAB — LIPID PANEL
Chol/HDL Ratio: 3.3 ratio (ref 0.0–4.4)
Cholesterol, Total: 124 mg/dL (ref 100–199)
HDL: 38 mg/dL — ABNORMAL LOW (ref >39)
LDL Chol Calc (NIH): 62 mg/dL (ref 0–99)
Triglycerides: 139 mg/dL (ref 0–149)
VLDL Cholesterol Cal: 24 mg/dL (ref 5–40)

## 2021-06-25 LAB — TSH: TSH: 1.76 u[IU]/mL (ref 0.450–4.500)

## 2021-06-25 LAB — POCT RAPID STREP A (OFFICE): Rapid Strep A Screen: NEGATIVE

## 2021-06-25 LAB — VITAMIN D 25 HYDROXY (VIT D DEFICIENCY, FRACTURES): Vit D, 25-Hydroxy: 51.4 ng/mL (ref 30.0–100.0)

## 2021-06-25 NOTE — Addendum Note (Signed)
Addended by: Eual Fines on: 06/25/2021 08:34 AM   Modules accepted: Orders

## 2021-06-30 DIAGNOSIS — M791 Myalgia, unspecified site: Secondary | ICD-10-CM | POA: Diagnosis not present

## 2021-06-30 DIAGNOSIS — G43719 Chronic migraine without aura, intractable, without status migrainosus: Secondary | ICD-10-CM | POA: Diagnosis not present

## 2021-06-30 DIAGNOSIS — M542 Cervicalgia: Secondary | ICD-10-CM | POA: Diagnosis not present

## 2021-06-30 DIAGNOSIS — G518 Other disorders of facial nerve: Secondary | ICD-10-CM | POA: Diagnosis not present

## 2021-07-01 ENCOUNTER — Other Ambulatory Visit: Payer: Self-pay | Admitting: Adult Health

## 2021-07-02 ENCOUNTER — Other Ambulatory Visit (HOSPITAL_COMMUNITY): Payer: Medicare HMO

## 2021-07-08 ENCOUNTER — Ambulatory Visit (HOSPITAL_COMMUNITY)
Admission: RE | Admit: 2021-07-08 | Discharge: 2021-07-08 | Disposition: A | Discharge status: Home / Self Care | Payer: Medicare HMO | Source: Ambulatory Visit | Attending: Family Medicine | Admitting: Family Medicine

## 2021-07-08 DIAGNOSIS — E2839 Other primary ovarian failure: Secondary | ICD-10-CM | POA: Insufficient documentation

## 2021-07-08 DIAGNOSIS — M85851 Other specified disorders of bone density and structure, right thigh: Secondary | ICD-10-CM | POA: Diagnosis not present

## 2021-07-08 DIAGNOSIS — Z780 Asymptomatic menopausal state: Secondary | ICD-10-CM | POA: Diagnosis not present

## 2021-07-16 DIAGNOSIS — F333 Major depressive disorder, recurrent, severe with psychotic symptoms: Secondary | ICD-10-CM | POA: Diagnosis not present

## 2021-07-18 ENCOUNTER — Encounter: Payer: Medicare HMO | Admitting: Obstetrics & Gynecology

## 2021-07-20 ENCOUNTER — Emergency Department (HOSPITAL_COMMUNITY)
Admission: EM | Admit: 2021-07-20 | Discharge: 2021-07-20 | Disposition: A | Discharge status: Home / Self Care | Payer: Medicare HMO | Attending: Emergency Medicine | Admitting: Emergency Medicine

## 2021-07-20 DIAGNOSIS — H60331 Swimmer's ear, right ear: Secondary | ICD-10-CM

## 2021-07-20 DIAGNOSIS — Z91040 Latex allergy status: Secondary | ICD-10-CM | POA: Insufficient documentation

## 2021-07-20 DIAGNOSIS — H9201 Otalgia, right ear: Secondary | ICD-10-CM | POA: Diagnosis present

## 2021-07-20 MED ORDER — CIPROFLOXACIN-DEXAMETHASONE 0.3-0.1 % OT SUSP: 4.0000 [drp] | Freq: Two times a day (BID) | OTIC | 0 refills | Status: DC

## 2021-07-20 NOTE — ED Triage Notes (Signed)
Cerumen impaction in R ear. Muffled hearing. Been there for week. Has only tried to get out with finger.

## 2021-07-20 NOTE — ED Provider Notes (Signed)
[facility] EMERGENCY DEPARTMENT Provider Note   CSN: [id number] Arrival date & time: 07/20/21  4967     History  Chief Complaint  Patient presents with   Cerumen Impaction    Tracey Daniel is a 41 y.o. female.  41 year old female presents with complaint of right ear discomfort.  Reports muffled hearing for the week, unable to remove wax from the ear using her finger.  Does report she has been sitting in the pool and has general ear discomfort without notable drainage.  No other concerns.       Home Medications Prior to Admission medications   Medication Sig Start Date End Date Taking? Authorizing Provider  ciprofloxacin-dexamethasone (CIPRODEX) OTIC suspension Place 4 drops into the right ear 2 (two) times daily. 07/20/21  Yes Tacy Learn, PA-C  acetaminophen (TYLENOL) 325 MG tablet Take 2 tablets (650 mg total) by mouth every 6 (six) hours as needed for mild pain (or Fever >/= 101). 03/11/18   Emokpae, Courage, MD  albuterol (VENTOLIN HFA) 108 (90 Base) MCG/ACT inhaler Inhale 2 puffs into the lungs every 6 (six) hours as needed for wheezing or shortness of breath. 06/24/21   [name], MD  azelastine (ASTELIN) 0.1 % nasal spray USE 2 SPRAYS IN EACH NOSTRIL TWICE DAILY AS DIRECTED 01/16/20   [name], MD  budesonide-formoterol [facility]) 80-4.5 MCG/ACT inhaler Inhale 2 puffs into the lungs 2 (two) times daily. 06/24/21   [name], MD  buPROPion (WELLBUTRIN SR) 150 MG 12 hr tablet Take 1 tablet (150 mg total) by mouth 2 (two) times daily. 06/24/21   [name], MD  clobetasol (OLUX) 0.05 % topical foam Apply topically 2 (two) times daily. 02/05/21   Lavonna Monarch, MD  clotrimazole-betamethasone (LOTRISONE) cream Apply sparingly to rash on face once daily for 1 week 08/08/19   [name], MD  diclofenac (VOLTAREN) 75 MG EC tablet TAKE ONE TABLET BY MOUTH ONCE DAILY FOR CHRONIC BACK PAIN (DOSE REDUCTION) 12/05/20    [name], MD  estradiol (ESTRACE) 2 MG tablet TAKE 1 TABLET(2 MG) BY MOUTH DAILY 07/01/21   Estill Dooms, NP  feeding supplement (ENSURE CLINICAL STRENGTH) LIQD Take 237 mLs by mouth 3 (three) times daily with meals. 01/01/11   Dhungel, Flonnie Overman, MD  fenofibrate (TRICOR) 48 MG tablet Take 1 tablet (48 mg total) by mouth daily. 03/19/21   [name], MD  fluticasone (FLONASE) 50 MCG/ACT nasal spray Place 2 sprays into both nostrils daily. 06/24/21   [name], MD  gabapentin (NEURONTIN) 100 MG capsule TAKE 1 CAPSULE(100 MG) BY MOUTH THREE TIMES DAILY 05/05/21   [name], MD  linaclotide [facility]) 145 MCG CAPS capsule Take 1 capsule (145 mcg total) by mouth daily before breakfast. 05/02/19   Carlis Stable, NP  megestrol (MEGACE) 40 MG tablet 3 tablets a day for 5 days, 2 tablets a day for 5 days then 1 tablet daily 12/05/20   Florian Buff, MD  metoCLOPramide (REGLAN) 5 MG tablet Take 0.5 tablets (2.5 mg total) by mouth 2 (two) times daily before a meal. 04/28/21   Erenest Rasher, PA-C  minocycline (MINOCIN) 50 MG capsule Take 1 capsule (50 mg total) by mouth 2 (two) times daily. 02/05/21   Lavonna Monarch, MD  nicotine (NICODERM CQ) 14 mg/24hr patch Place 1 patch (14 mg total) onto the skin daily. 08/05/21   [name], MD  nicotine (NICODERM CQ) 21 mg/24hr patch  Place 1 patch (21 mg total) onto the skin daily. 07/08/21   [name], MD  ondansetron (ZOFRAN-ODT) 4 MG disintegrating tablet Take 1 tablet (4 mg total) by mouth every 8 (eight) hours as needed for nausea or vomiting. 11/27/20   [name], MD  pantoprazole (PROTONIX) 40 MG tablet TAKE 1 TABLET(40 MG) BY MOUTH TWICE DAILY BEFORE A MEAL 12/03/20   Mahala Menghini, PA-C  potassium chloride SA (KLOR-CON) 20 MEQ tablet TAKE 1 TABLET BY MOUTH EVERY DAY 07/09/20   [name], MD  prazosin (MINIPRESS) 1 MG capsule  02/21/20   [provider]  predniSONE (DELTASONE) 10  MG tablet Take 1 tablet (10 mg total) by mouth 2 (two) times daily with a meal. 06/24/21   [name], MD  progesterone (PROMETRIUM) 200 MG capsule 1 capsule daily at bedtime days 1-21 of each month 10/08/20   Florian Buff, MD  propranolol (INDERAL) 10 MG tablet Take 1 tablet (10 mg total) by mouth 3 (three) times daily. 03/11/18   Roxan Hockey, MD  risperiDONE (RISPERDAL) 0.5 MG tablet Take 0.5 mg at bedtime by mouth.    [provider]  rosuvastatin (CRESTOR) 20 MG tablet TAKE 1 TABLET(20 MG) BY MOUTH DAILY 06/18/21   [name], MD  sertraline (ZOLOFT) 50 MG tablet Take 50 mg by mouth at bedtime.     [provider]  sucralfate (CARAFATE) 1 g tablet One tablet every morning and at bedtime for stomach burning. 04/28/21   Erenest Rasher, PA-C  tiZANidine (ZANAFLEX) 4 MG tablet TAKE 1 TABLET(4 MG) BY MOUTH TWICE DAILY AS NEEDED FOR BACK SPASMS 12/18/20   [name], MD  Vitamin D, Ergocalciferol, (DRISDOL) 1.25 MG (50000 UNIT) CAPS capsule TAKE 1 CAPSULE BY MOUTH 1 TIME A WEEK 10/29/20   [name], MD  zonisamide (ZONEGRAN) 50 MG capsule Take 150 mg by mouth at bedtime.  02/21/17   [provider]      Allergies    Benadryl [diphenhydramine], Penicillins, and Latex    Review of Systems   Review of Systems Negative except as per HPI Physical Exam Updated Vital Signs BP 122/77   Pulse 77   Temp 98.5 F (36.9 C) (Oral)   Resp 18   LMP  (LMP Unknown) Comment: pt reports premature menopause  SpO2 100%  Physical Exam Vitals and nursing note reviewed.  Constitutional:      General: She is not in acute distress.    Appearance: She is well-developed. She is not diaphoretic.  HENT:     Head: Normocephalic and atraumatic.     Right Ear: Tympanic membrane normal. Swelling and tenderness present. No middle ear effusion. There is no impacted cerumen.     Left Ear: Tympanic membrane and ear canal normal. There is no impacted  cerumen.     Nose: Nose normal.  Pulmonary:     Effort: Pulmonary effort is normal.  Skin:    General: Skin is warm and dry.     Findings: No erythema or rash.  Neurological:     Mental Status: She is alert and oriented to person, place, and time.  Psychiatric:        Behavior: Behavior normal.     ED Results / Procedures / Treatments   Labs (all labs ordered are listed, but only abnormal results are displayed) Labs Reviewed - No data to display  EKG None  Radiology No results found.  Procedures Procedures  Medications Ordered in ED Medications - No data to display  ED Course/ Medical Decision Making/ A&P                           Medical Decision Making Risk Prescription drug management.   41 yo female with right ear discomfort found to have mild canal inflammation and drainage. TM normal. Plan is to treat with ciprodex, recheck with PCP if symptoms persist.         Final Clinical Impression(s) / ED Diagnoses Final diagnoses:  Acute swimmer's ear of right side    Rx / DC Orders ED Discharge Orders          Ordered    ciprofloxacin-dexamethasone (CIPRODEX) OTIC suspension  2 times daily        07/20/21 0940              Tacy Learn, PA-C 07/20/21 0156    Carmin Muskrat, MD 07/21/21 2242

## 2021-07-20 NOTE — Discharge Instructions (Signed)
Keep ear dry.  Apply drops as prescribed.  Recheck with your doctor if your discomfort persists.

## 2021-07-21 ENCOUNTER — Other Ambulatory Visit (HOSPITAL_COMMUNITY)
Admission: RE | Admit: 2021-07-21 | Discharge: 2021-07-21 | Disposition: A | Discharge status: Home / Self Care | Payer: Medicare HMO | Source: Ambulatory Visit | Attending: Obstetrics & Gynecology | Admitting: Obstetrics & Gynecology

## 2021-07-21 ENCOUNTER — Encounter: Payer: Self-pay | Admitting: Obstetrics & Gynecology

## 2021-07-21 ENCOUNTER — Ambulatory Visit (INDEPENDENT_AMBULATORY_CARE_PROVIDER_SITE_OTHER): Payer: Medicare HMO | Admitting: Obstetrics & Gynecology

## 2021-07-21 VITALS — BP 121/83 | HR 72 | Ht 66.0 in | Wt 171.0 lb

## 2021-07-21 DIAGNOSIS — Z8742 Personal history of other diseases of the female genital tract: Secondary | ICD-10-CM | POA: Diagnosis not present

## 2021-07-21 DIAGNOSIS — Z124 Encounter for screening for malignant neoplasm of cervix: Secondary | ICD-10-CM | POA: Insufficient documentation

## 2021-07-21 DIAGNOSIS — N870 Mild cervical dysplasia: Secondary | ICD-10-CM | POA: Diagnosis not present

## 2021-07-21 DIAGNOSIS — Z1151 Encounter for screening for human papillomavirus (HPV): Secondary | ICD-10-CM | POA: Insufficient documentation

## 2021-07-21 DIAGNOSIS — R87610 Atypical squamous cells of undetermined significance on cytologic smear of cervix (ASC-US): Secondary | ICD-10-CM | POA: Diagnosis not present

## 2021-07-21 NOTE — Progress Notes (Signed)
Follow up appointment for repeat HPV Based cytology: Hx of LSIL on biopsy  Chief Complaint  Patient presents with   needs repeat pap    Blood pressure 121/83, pulse 72, height '5\' 6"'$  (1.676 m), weight 171 lb (77.6 kg).  Surgical path 07/25/20: LSIL Cytology/HPV: LSIL +HR HPV(neg 16/18)  Follow up testing today  MEDS ordered this encounter: No orders of the defined types were placed in this encounter.   Orders for this encounter: No orders of the defined types were placed in this encounter.   Impression + Management Plan   ICD-10-CM   1. Low grade dysplasia of cervical by colposcopic biopsy 07/25/20  N87.0    HPV based cytology: LSIL + HR HPV(neg 16/18)--> colpo wih LSIL surgical pathology Plan: based on [facility] recs when cytology/HPV return    2. History of abnormal cervical Pap smear  Z87.42 Cytology - PAP( [place])      Follow Up: Return in about 1 year (around 07/22/2022), or if symptoms worsen or fail to improve.     All questions were answered.  Past Medical History:  Diagnosis Date   Alopecia    Anemia of other chronic disease 11/29/2012   Asthma    Back pain, chronic    Bronchitis    Chronic abdominal pain    Constipation    COPD (chronic obstructive pulmonary disease) (HCC)    Cyst of skin    mid spine area   Depression 2000   h/o suicidal ideation   Gastric outlet obstruction    Gastritis    Gastroparesis    GERD (gastroesophageal reflux disease)    LGSIL of cervix of undetermined significance 07/08/2020   07/08/20 pt aware will get colpo, per [facility]P   Migraine    Migraines    Nausea and vomiting    recurrent   Nicotine addiction    Osteoporosis    Peptic ulcer disease    Pyloric spasm 03/30/2011   Seasonal allergies    Sinusitis    Substance abuse ([place]) 2008   marijuana   Vaginal Pap smear, abnormal    Vitamin D deficiency     Past Surgical History:  Procedure Laterality Date   BALLOON DILATION N/A 02/09/2013   Procedure: BALLOON  DILATION;  Surgeon: Missy Sabins, MD;  Location: [facility];  Service: Endoscopy;  Laterality: N/A;   BALLOON DILATION N/A 03/10/2018   Procedure: BALLOON DILATION;  Surgeon: Daneil Dolin, MD;  Location: [facility];  Service: Endoscopy;  Laterality: N/A;   BILIARY DILATION  06/01/2018   Procedure: PYLORIC DILATION;  Surgeon: Rush Landmark Telford Nab., MD;  Location: [facility] ENDOSCOPY;  Service: Gastroenterology;;   BIOPSY  06/01/2018   Procedure: BIOPSY;  Surgeon: Irving Copas., MD;  Location: [facility] ENDOSCOPY;  Service: Gastroenterology;;   BOTOX INJECTION N/A 02/09/2013   Procedure: BOTOX INJECTION;  Surgeon: Missy Sabins, MD;  Location: [facility];  Service: Endoscopy;  Laterality: N/A;  possible balloon   BOTOX INJECTION N/A 10/24/2015   Procedure: BOTOX INJECTION;  Surgeon: Daneil Dolin, MD;  Location: [facility];  Service: Endoscopy;  Laterality: N/A;   BOTOX INJECTION N/A 03/10/2018   Procedure: BOTOX INJECTION;  Surgeon: Daneil Dolin, MD;  Location: [facility];  Service: Endoscopy;  Laterality: N/A;  Melanie notified   BOTOX INJECTION  06/01/2018   Procedure: BOTOX INJECTION;  Surgeon: Rush Landmark Telford Nab., MD;  Location: [place];  Service: Gastroenterology;;   CARPAL TUNNEL RELEASE     left  hand   COLONOSCOPY WITH PROPOFOL N/A 09/20/2014   RMR: Normal ileo-colonoscopy   ESOPHAGEAL DILATION  12/25/2010   Procedure: ESOPHAGEAL DILATION;  Surgeon: Dorothyann Peng, MD;  [facility]: [facility];  Service: Endoscopy;;   ESOPHAGEAL MANOMETRY N/A 10/03/2018   Surgeon: Mauri Pole, MD;  Normal.   ESOPHAGOGASTRODUODENOSCOPY  12/25/2010   Dorothyann Peng, MD;  moderate gastritis, ?goo secondary to pylorspasm. BX showed reactive gstropathy no h.pyori, SB mucosa with intramucosal lymphocytosis and partial villous blunting (TTG 4.0 normal)   ESOPHAGOGASTRODUODENOSCOPY N/A 11/14/2012   LNL:GXQJJ hiatal hernia. Abnormal gastric mucosa of  uncertain  significance-status post biopsy. Subjectively, patient may have recurrent symptomatic, pylorospasm.   ESOPHAGOGASTRODUODENOSCOPY (EGD) WITH PROPOFOL N/A 02/09/2013   elongated stomach, partial lower esophageal ring widely patent. No obvious pyloric stenosis s/p Botox   ESOPHAGOGASTRODUODENOSCOPY (EGD) WITH PROPOFOL N/A 10/24/2015   Procedure: ESOPHAGOGASTRODUODENOSCOPY (EGD) WITH PROPOFOL;  Surgeon: Daneil Dolin, MD;  Location: [facility];  Service: Endoscopy;  Laterality: N/A;  830    ESOPHAGOGASTRODUODENOSCOPY (EGD) WITH PROPOFOL N/A 03/10/2018   Dr. Gala Romney: normal esophagus, elongated, dilated stomach likely stigmata of slow gastric emptying due to narrowing of pyloric channel, s/p balloon dilatation of pyloric channel. Small hiatal hernia, normal duodenal bulb and second portion of duodenum   ESOPHAGOGASTRODUODENOSCOPY (EGD) WITH PROPOFOL N/A 06/01/2018   Surgeon: Irving Copas., MD; normal esophagus s/p biopsy, small hiatal hernia, erythematous mucosa in the cardia, gastric fundus, gastric body, erosive gastropathy s/p biopsy, normal pylorus s/p dilation and Botox injection, normal duodenum s/p biopsy.  Surgical pathology all benign.   LAPAROSCOPIC CHOLECYSTECTOMY  2002   [facility]?  [place]?   laser surgery on cervix     NASAL SEPTUM SURGERY  01/29/2017   TOE SURGERY      OB History     Gravida  0   Para      Term      Preterm      AB      Living         SAB      IAB      Ectopic      Multiple      Live Births              Allergies  Allergen Reactions   Benadryl [Diphenhydramine] Anaphylaxis   Penicillins Other (See Comments)    Unknown Has patient had a PCN reaction causing immediate rash, facial/tongue/throat swelling, SOB or lightheadedness with hypotension Yes Has patient had a PCN reaction causing severe rash involving mucus membranes or skin necrosis: Yes-hives  Has patient had a PCN reaction that required hospitalization Yes Has  patient had a PCN reaction occurring within the last 10 years: Yes If all of the above answers are "NO", then may proceed with Cephalosporin use.    Latex Itching and Other (See Comments)    "burning" where the tape goes    Social History   Socioeconomic History   Marital status: Significant Other    Spouse name: Not on file   Number of children: 0   Years of education: 9   Highest education level: 9th grade  Occupational History   Occupation: disability    Employer: UNEMPLOYED  Tobacco Use   Smoking status: Every Day    Packs/day: 0.25    Years: 15.00    Total pack years: 3.75    Types: Cigarettes   Smokeless tobacco: Never   Tobacco comments:    2 per day  Vaping Use   Vaping Use: Never used  Substance and Sexual Activity   Alcohol use: No    Alcohol/week: 0.0 standard drinks of alcohol   Drug use: Yes    Frequency: 2.0 times per week    Types: Marijuana    Comment: once or twice a week   Sexual activity: Yes    Birth control/protection: Post-menopausal  Other Topics Concern   Not on file  Social History Narrative   Not on file   Social Determinants of Health   Financial Resource Strain: Medium Risk (07/21/2021)   Overall Financial Resource Strain (CARDIA)    Difficulty of Paying Living Expenses: Somewhat hard  Food Insecurity: Food Insecurity Present (07/21/2021)   Hunger Vital Sign    Worried About Running Out of Food in the Last Year: Sometimes true    Ran Out of Food in the Last Year: Sometimes true  Transportation Needs: No Transportation Needs (07/21/2021)   PRAPARE - Hydrologist (Medical): No    Lack of Transportation (Non-Medical): No  Physical Activity: Insufficiently Active (07/21/2021)   Exercise Vital Sign    Days of Exercise per Week: 2 days    Minutes of Exercise per Session: 30 min  Stress: No Stress Concern Present (07/21/2021)   [place]    Feeling of  Stress : Only a little  Social Connections: Moderately Isolated (07/21/2021)   Social Connection and Isolation Panel [NHANES]    Frequency of Communication with Friends and Family: Once a week    Frequency of Social Gatherings with Friends and Family: Once a week    Attends Religious Services: 1 to 4 times per year    Active Member of [employer] or Organizations: No    Attends Archivist Meetings: Never    Marital Status: Living with partner    Family History  Problem Relation Age of Onset   Lung cancer Mother 55   Diabetes Father    Liver disease Father        liver transplant at [facility], age 86   Cancer Father        Not sure if colon cancer   Multiple sclerosis Sister 25   Depression Sister 35   Alcohol abuse Sister    Bipolar disorder Sister    Schizophrenia Sister    Heart disease Maternal Grandmother    Parkinson's disease Maternal Grandfather

## 2021-07-24 ENCOUNTER — Telehealth: Payer: Self-pay | Admitting: *Deleted

## 2021-07-24 ENCOUNTER — Other Ambulatory Visit: Payer: Self-pay | Admitting: Gastroenterology

## 2021-07-24 DIAGNOSIS — K3184 Gastroparesis: Secondary | ICD-10-CM

## 2021-07-24 MED ORDER — ONDANSETRON 4 MG PO TBDP: 4.0000 mg | ORAL_TABLET | Freq: Three times a day (TID) | ORAL | 0 refills | Status: DC | PRN

## 2021-07-24 NOTE — Telephone Encounter (Signed)
Received refill request for Ondansetron 4 mg. Pt last OV 04/28/21

## 2021-07-24 NOTE — Telephone Encounter (Signed)
Rx sent 

## 2021-07-24 NOTE — Telephone Encounter (Signed)
Noted  

## 2021-07-25 LAB — CYTOLOGY - PAP
Chlamydia: NEGATIVE
Comment: NEGATIVE
Comment: NEGATIVE
Comment: NEGATIVE
Comment: NEGATIVE
Comment: NORMAL
Diagnosis: UNDETERMINED — AB
HPV 16: NEGATIVE
HPV 18 / 45: NEGATIVE
High risk HPV: POSITIVE — AB
Neisseria Gonorrhea: NEGATIVE

## 2021-07-30 ENCOUNTER — Telehealth: Payer: Self-pay

## 2021-07-30 NOTE — Telephone Encounter (Signed)
error 

## 2021-07-31 ENCOUNTER — Ambulatory Visit
Admission: RE | Admit: 2021-07-31 | Discharge: 2021-07-31 | Disposition: A | Discharge status: Home / Self Care | Payer: Medicare HMO | Source: Ambulatory Visit | Attending: Family Medicine | Admitting: Family Medicine

## 2021-07-31 DIAGNOSIS — Z1231 Encounter for screening mammogram for malignant neoplasm of breast: Secondary | ICD-10-CM | POA: Diagnosis not present

## 2021-08-08 ENCOUNTER — Other Ambulatory Visit: Payer: Self-pay | Admitting: Adult Health

## 2021-08-08 ENCOUNTER — Other Ambulatory Visit: Payer: Self-pay | Admitting: Family Medicine

## 2021-08-14 ENCOUNTER — Ambulatory Visit (INDEPENDENT_AMBULATORY_CARE_PROVIDER_SITE_OTHER): Payer: Medicare HMO | Admitting: Family Medicine

## 2021-08-14 ENCOUNTER — Encounter: Payer: Self-pay | Admitting: Family Medicine

## 2021-08-14 VITALS — BP 134/86 | HR 89 | Resp 16 | Ht 66.0 in | Wt 165.1 lb

## 2021-08-14 DIAGNOSIS — E782 Mixed hyperlipidemia: Secondary | ICD-10-CM | POA: Diagnosis not present

## 2021-08-14 DIAGNOSIS — F17218 Nicotine dependence, cigarettes, with other nicotine-induced disorders: Secondary | ICD-10-CM | POA: Diagnosis not present

## 2021-08-14 DIAGNOSIS — J45991 Cough variant asthma: Secondary | ICD-10-CM

## 2021-08-14 DIAGNOSIS — F324 Major depressive disorder, single episode, in partial remission: Secondary | ICD-10-CM | POA: Diagnosis not present

## 2021-08-14 DIAGNOSIS — K219 Gastro-esophageal reflux disease without esophagitis: Secondary | ICD-10-CM | POA: Diagnosis not present

## 2021-08-14 DIAGNOSIS — M8580 Other specified disorders of bone density and structure, unspecified site: Secondary | ICD-10-CM | POA: Diagnosis not present

## 2021-08-14 MED ORDER — OYSTER SHELL CALCIUM/D3 500-5 MG-MCG PO TABS: 1.0000 | ORAL_TABLET | Freq: Two times a day (BID) | ORAL | 11 refills | Status: AC

## 2021-08-14 MED ORDER — DENOSUMAB 60 MG/ML ~~LOC~~ SOSY: 60.0000 mg | PREFILLED_SYRINGE | SUBCUTANEOUS | 3 refills | Status: DC

## 2021-08-14 NOTE — Patient Instructions (Addendum)
F/u in early October, re evaluate blood pressure and smoking , you need to quit   Commit to walking 20 minutes every day if possible, also to calcium  with Vit D twice daily as I prescribed  Thanks for choosing [facility], we consider it a privelige to serve you.

## 2021-08-16 DIAGNOSIS — M8580 Other specified disorders of bone density and structure, unspecified site: Secondary | ICD-10-CM | POA: Insufficient documentation

## 2021-08-16 NOTE — Assessment & Plan Note (Signed)
Managed by GI currently controlled

## 2021-08-16 NOTE — Assessment & Plan Note (Signed)
Treated by psych, controlled on current meds

## 2021-08-16 NOTE — Assessment & Plan Note (Signed)
Asked:confirms currently smokes cigarettes, down to 4/day Assess: Unwilling to set a quit date, but is cutting back Advise: needs to QUIT to reduce risk of cancer, cardio and cerebrovascular disease Assist: counseled for 5 minutes and literature provided Arrange: follow up in 2 to 4 months

## 2021-08-16 NOTE — Progress Notes (Signed)
   Tracey Daniel     MRN: [id number]      DOB: [birth year]   HPI [name] is here for follow up and re-evaluation of chronic medical conditions, medication management and review of any available recent lab and radiology data.  Preventive health is updated, specifically  Cancer screening and Immunization.   Cigarettes are down to 4/day, trying to quit Here to review recent dexa and the need to start bone builder ROS Denies recent fever or chills. Denies sinus pressure, nasal congestion, ear pain or sore throat. Denies chest congestion, productive cough or wheezing. Denies chest pains, palpitations and leg swelling Denies abdominal pain, nausea, vomiting,diarrhea or constipation.   Denies dysuria, frequency, hesitancy or incontinence. Denies joint pain, swelling and limitation in mobility. Denies headaches, seizures, numbness, or tingling. Denies  uncontrolled depression, anxiety or insomnia. Denies skin break down or rash.   PE  BP 134/86   Pulse 89   Resp 16   Ht '5\' 6"'$  (1.676 m)   Wt 165 lb 1.9 oz (74.9 kg)   LMP  (LMP Unknown) Comment: pt reports premature menopause  SpO2 99%   BMI 26.65 kg/m   Patient alert and oriented and in no cardiopulmonary distress.  HEENT: No facial asymmetry, EOMI,     Neck supple .  Chest: Clear to auscultation bilaterally.  CVS: S1, S2 no murmurs, no S3.Regular rate.  ABD: Soft non tender.   Ext: No edema  MS: Adequate ROM spine, shoulders, hips and knees.  Skin: Intact, no ulcerations or rash noted.  Psych: Good eye contact, normal affect. Memory intact not anxious or depressed appearing.  CNS: CN 2-12 intact, power,  normal throughout.no focal deficits noted.   Assessment & Plan  Osteopenia On chronic estrogen with premature ovarian failure, commit to daily weight bearing exercise, calcium with D supplement , add prolia twice yearly  Nicotine dependence, cigarettes, with other nicotine-induced disorders Asked:confirms  currently smokes cigarettes, down to 4/day Assess: Unwilling to set a quit date, but is cutting back Advise: needs to QUIT to reduce risk of cancer, cardio and cerebrovascular disease Assist: counseled for 5 minutes and literature provided Arrange: follow up in 2 to 4 months   Hyperlipemia Hyperlipidemia:Low fat diet discussed and encouraged.   Lipid Panel  Lab Results  Component Value Date   CHOL 124 06/24/2021   HDL 38 (L) 06/24/2021   LDLCALC 62 06/24/2021   TRIG 139 06/24/2021   CHOLHDL 3.3 06/24/2021   Needs to inc exrcise , no med change    Gastroesophageal reflux disease without esophagitis Managed by GI currently controlled  Depression Treated by psych, controlled on current meds

## 2021-08-16 NOTE — Assessment & Plan Note (Signed)
Controlled, no change in medication  

## 2021-08-16 NOTE — Assessment & Plan Note (Signed)
On chronic estrogen with premature ovarian failure, commit to daily weight bearing exercise, calcium with D supplement , add prolia twice yearly

## 2021-08-16 NOTE — Assessment & Plan Note (Signed)
Hyperlipidemia:Low fat diet discussed and encouraged.   Lipid Panel  Lab Results  Component Value Date   CHOL 124 06/24/2021   HDL 38 (L) 06/24/2021   LDLCALC 62 06/24/2021   TRIG 139 06/24/2021   CHOLHDL 3.3 06/24/2021   Needs to inc exrcise , no med change

## 2021-08-18 ENCOUNTER — Ambulatory Visit (INDEPENDENT_AMBULATORY_CARE_PROVIDER_SITE_OTHER): Payer: Medicare HMO | Admitting: *Deleted

## 2021-08-18 DIAGNOSIS — M791 Myalgia, unspecified site: Secondary | ICD-10-CM | POA: Diagnosis not present

## 2021-08-18 DIAGNOSIS — M8580 Other specified disorders of bone density and structure, unspecified site: Secondary | ICD-10-CM

## 2021-08-18 DIAGNOSIS — G518 Other disorders of facial nerve: Secondary | ICD-10-CM | POA: Diagnosis not present

## 2021-08-18 DIAGNOSIS — G43719 Chronic migraine without aura, intractable, without status migrainosus: Secondary | ICD-10-CM | POA: Diagnosis not present

## 2021-08-18 DIAGNOSIS — M542 Cervicalgia: Secondary | ICD-10-CM | POA: Diagnosis not present

## 2021-08-18 MED ORDER — DENOSUMAB 60 MG/ML ~~LOC~~ SOSY
60.0000 mg | PREFILLED_SYRINGE | Freq: Once | SUBCUTANEOUS | Status: AC
  Administered 2021-08-18: 60 mg via SUBCUTANEOUS

## 2021-09-12 ENCOUNTER — Other Ambulatory Visit: Payer: Self-pay | Admitting: Family Medicine

## 2021-09-26 ENCOUNTER — Other Ambulatory Visit: Payer: Self-pay | Admitting: Family Medicine

## 2021-10-13 DIAGNOSIS — M791 Myalgia, unspecified site: Secondary | ICD-10-CM | POA: Diagnosis not present

## 2021-10-13 DIAGNOSIS — G518 Other disorders of facial nerve: Secondary | ICD-10-CM | POA: Diagnosis not present

## 2021-10-13 DIAGNOSIS — M542 Cervicalgia: Secondary | ICD-10-CM | POA: Diagnosis not present

## 2021-10-13 DIAGNOSIS — G43719 Chronic migraine without aura, intractable, without status migrainosus: Secondary | ICD-10-CM | POA: Diagnosis not present

## 2021-10-19 ENCOUNTER — Other Ambulatory Visit: Payer: Self-pay | Admitting: Obstetrics & Gynecology

## 2021-10-23 ENCOUNTER — Ambulatory Visit: Payer: Medicare HMO | Admitting: Family Medicine

## 2021-10-27 NOTE — Progress Notes (Signed)
Referring Provider: [name], MD Primary Care Physician:  [name], MD Primary GI Physician: Dr. Gala Romney  Chief Complaint  Patient presents with   Follow-up    HPI:   Tracey Daniel is a 41 y.o. female with history of constipation, GERD, gastroparesis, gastric outlet obstruction secondary to recurrent pylorospasm/pyloric stenosis requiring repeat EGDs with dilation/Botox injections with last in 2020, presenting today for follow-up.  Last seen in our office 04/28/2021.  She was doing very well from gastroparesis standpoint.  Reglan had been decreased to 5 mg twice daily in September 2022 and she was tolerating this well.  Plan to try decreasing further to 2.5 mg twice daily.  She was having breakthrough reflux symptoms 2-3 times a week on Protonix 40 mg twice daily and using Carafate as needed for breakthrough.  Felt this was likely secondary to eating spicy sausage.  Discussed need for dietary changes.  Constipation was well controlled on Linzess 145 mcg daily.  Noted she was due for routine colonoscopy in 2026, but patient reports she was not sure if her dad had colon cancer and planned to ask him about this.  Planned to follow-up in 6 months.  Today:  GERD: Well controlled. Taking pantoprazole 40 mg BID. Carafate about once a week for breakthrough. No dysphagia.   Gastroparesis: Doing well on Reglan 2.5 mg BID. No side effects to Reglan. No nausea, vomiting, or abdominal pain. Eating well. Appetite is good. No weight loss. Has been gaining weight. Rare use of Zofran.   Constipation: Well controlled on Linzess 145 mcg daly. No brbpr or melena.   Family history of colon cancer: None.    Past Medical History:  Diagnosis Date   Alopecia    Anemia of other chronic disease 11/29/2012   Asthma    Back pain, chronic    Bronchitis    Chronic abdominal pain    Constipation    COPD (chronic obstructive pulmonary disease) (HCC)    Cyst of skin    mid spine  area   Depression 2000   h/o suicidal ideation   Gastric outlet obstruction    Gastritis    Gastroparesis    GERD (gastroesophageal reflux disease)    LGSIL of cervix of undetermined significance 07/08/2020   07/08/20 pt aware will get colpo, per [facility]   Migraine    Migraines    Nausea and vomiting    recurrent   Nicotine addiction    Osteoporosis    Peptic ulcer disease    Pyloric spasm 03/30/2011   Seasonal allergies    Sinusitis    Substance abuse ([place]) 2008   marijuana   Vaginal Pap smear, abnormal    Vitamin D deficiency     Past Surgical History:  Procedure Laterality Date   BALLOON DILATION N/A 02/09/2013   Procedure: BALLOON DILATION;  Surgeon: Missy Sabins, MD;  Location: [facility];  Service: Endoscopy;  Laterality: N/A;   BALLOON DILATION N/A 03/10/2018   Procedure: BALLOON DILATION;  Surgeon: Daneil Dolin, MD;  Location: [facility];  Service: Endoscopy;  Laterality: N/A;   BILIARY DILATION  06/01/2018   Procedure: PYLORIC DILATION;  Surgeon: Rush Landmark Telford Nab., MD;  Location: [facility] ENDOSCOPY;  Service: Gastroenterology;;   BIOPSY  06/01/2018   Procedure: BIOPSY;  Surgeon: Irving Copas., MD;  Location: [facility] ENDOSCOPY;  Service: Gastroenterology;;   BOTOX INJECTION N/A 02/09/2013   Procedure: BOTOX INJECTION;  Surgeon: Missy Sabins, MD;  Location: [facility];  Service: Endoscopy;  Laterality: N/A;  possible balloon   BOTOX INJECTION N/A 10/24/2015   Procedure: BOTOX INJECTION;  Surgeon: Daneil Dolin, MD;  Location: [facility];  Service: Endoscopy;  Laterality: N/A;   BOTOX INJECTION N/A 03/10/2018   Procedure: BOTOX INJECTION;  Surgeon: Daneil Dolin, MD;  [facility]: [facility];  Service: Endoscopy;  Laterality: N/A;  Melanie notified   BOTOX INJECTION  06/01/2018   Procedure: BOTOX INJECTION;  Surgeon: Rush Landmark Telford Nab., MD;  Location: [facility];  Service: Gastroenterology;;   CARPAL TUNNEL RELEASE     left hand    COLONOSCOPY WITH PROPOFOL N/A 09/20/2014   RMR: Normal ileo-colonoscopy   ESOPHAGEAL DILATION  12/25/2010   Procedure: ESOPHAGEAL DILATION;  Surgeon: Dorothyann Peng, MD;  [facility]: [facility];  Service: Endoscopy;;   ESOPHAGEAL MANOMETRY N/A 10/03/2018   Surgeon: Mauri Pole, MD;  Normal.   ESOPHAGOGASTRODUODENOSCOPY  12/25/2010   Dorothyann Peng, MD;  moderate gastritis, ?goo secondary to pylorspasm. BX showed reactive gstropathy no h.pyori, SB mucosa with intramucosal lymphocytosis and partial villous blunting (TTG 4.0 normal)   ESOPHAGOGASTRODUODENOSCOPY N/A 11/14/2012   CBJ:SEGBT hiatal hernia. Abnormal gastric mucosa of  uncertain significance-status post biopsy. Subjectively, patient may have recurrent symptomatic, pylorospasm.   ESOPHAGOGASTRODUODENOSCOPY (EGD) WITH PROPOFOL N/A 02/09/2013   elongated stomach, partial lower esophageal ring widely patent. No obvious pyloric stenosis s/p Botox   ESOPHAGOGASTRODUODENOSCOPY (EGD) WITH PROPOFOL N/A 10/24/2015   Procedure: ESOPHAGOGASTRODUODENOSCOPY (EGD) WITH PROPOFOL;  Surgeon: Daneil Dolin, MD;  [facility];  Service: Endoscopy;  Laterality: N/A;  830    ESOPHAGOGASTRODUODENOSCOPY (EGD) WITH PROPOFOL N/A 03/10/2018   Dr. Gala Romney: normal esophagus, elongated, dilated stomach likely stigmata of slow gastric emptying due to narrowing of pyloric channel, s/p balloon dilatation of pyloric channel. Small hiatal hernia, normal duodenal bulb and second portion of duodenum   ESOPHAGOGASTRODUODENOSCOPY (EGD) WITH PROPOFOL N/A 06/01/2018   Surgeon: Irving Copas., MD; normal esophagus s/p biopsy, small hiatal hernia, erythematous mucosa in the cardia, gastric fundus, gastric body, erosive gastropathy s/p biopsy, normal pylorus s/p dilation and Botox injection, normal duodenum s/p biopsy.  Surgical pathology all benign.   LAPAROSCOPIC CHOLECYSTECTOMY  2002   [facility]?  [place]?   laser surgery on cervix     NASAL  SEPTUM SURGERY  01/29/2017   TOE SURGERY      Current Outpatient Medications  Medication Sig Dispense Refill   acetaminophen (TYLENOL) 325 MG tablet Take 2 tablets (650 mg total) by mouth every 6 (six) hours as needed for mild pain (or Fever >/= 101). 20 tablet 0   albuterol (VENTOLIN HFA) 108 (90 Base) MCG/ACT inhaler Inhale 2 puffs into the lungs every 6 (six) hours as needed for wheezing or shortness of breath. 8 g 5   azelastine (ASTELIN) 0.1 % nasal spray USE 2 SPRAYS IN EACH NOSTRIL TWICE DAILY AS DIRECTED 30 mL 2   budesonide-formoterol (SYMBICORT) 80-4.5 MCG/ACT inhaler Inhale 2 puffs into the lungs 2 (two) times daily. 1 each 4   buPROPion (WELLBUTRIN SR) 150 MG 12 hr tablet Take 1 tablet (150 mg total) by mouth 2 (two) times daily. 60 tablet 5   calcium-vitamin D (OSCAL WITH D) 500-5 MG-MCG tablet Take 1 tablet by mouth 2 (two) times daily. 60 tablet 11   clotrimazole-betamethasone (LOTRISONE) cream Apply sparingly to rash on face once daily for 1 week 15 g 0   denosumab (PROLIA) 60 MG/ML SOSY injection Inject 60 mg into the  skin every 6 (six) months. 1 mL 3   diclofenac (VOLTAREN) 75 MG EC tablet TAKE 1 TABLET ONE TIME DAILY FOR CHRONIC BACK PAIN (DOSE REDUCTION) 90 tablet 1   estradiol (ESTRACE) 2 MG tablet TAKE 1 TABLET(2 MG) BY MOUTH DAILY 30 tablet 3   feeding supplement (ENSURE CLINICAL STRENGTH) LIQD Take 237 mLs by mouth 3 (three) times daily with meals. 10 Bottle 3   fenofibrate (TRICOR) 48 MG tablet TAKE 1 TABLET(48 MG) BY MOUTH DAILY 30 tablet 5   fluticasone (FLONASE) 50 MCG/ACT nasal spray Place 2 sprays into both nostrils daily. 16 g 6   gabapentin (NEURONTIN) 100 MG capsule TAKE 1 CAPSULE(100 MG) BY MOUTH THREE TIMES DAILY 90 capsule 5   linaclotide (LINZESS) 145 MCG CAPS capsule Take 1 capsule (145 mcg total) by mouth daily before breakfast. 30 capsule 5   metoCLOPramide (REGLAN) 5 MG tablet Take 0.5 tablets (2.5 mg total) by mouth 2 (two) times daily before a meal. 60  tablet 2   minocycline (MINOCIN) 50 MG capsule Take 1 capsule (50 mg total) by mouth 2 (two) times daily. 60 capsule 11   ondansetron (ZOFRAN-ODT) 4 MG disintegrating tablet Take 1 tablet (4 mg total) by mouth every 8 (eight) hours as needed for nausea or vomiting. 30 tablet 0   pantoprazole (PROTONIX) 40 MG tablet TAKE 1 TABLET(40 MG) BY MOUTH TWICE DAILY BEFORE A MEAL 180 tablet 3   potassium chloride SA (KLOR-CON M) 20 MEQ tablet TAKE 1 TABLET BY MOUTH EVERY DAY 90 tablet 3   prazosin (MINIPRESS) 1 MG capsule      progesterone (PROMETRIUM) 200 MG capsule TAKE ONE CAPSULE BY MOUTH DAILY AT BEDTIME; DAYS 1 THROUGH 21 OF EACH MONTH. 21 capsule 11   propranolol (INDERAL) 10 MG tablet Take 1 tablet (10 mg total) by mouth 3 (three) times daily. 90 tablet 2   risperiDONE (RISPERDAL) 0.5 MG tablet Take 0.5 mg at bedtime by mouth.     rosuvastatin (CRESTOR) 20 MG tablet TAKE 1 TABLET(20 MG) BY MOUTH DAILY 30 tablet 5   sertraline (ZOLOFT) 50 MG tablet Take 50 mg by mouth at bedtime.      sucralfate (CARAFATE) 1 g tablet One tablet every morning and at bedtime for stomach burning. 180 tablet 3   tiZANidine (ZANAFLEX) 4 MG tablet TAKE 1 TABLET(4 MG) BY MOUTH TWICE DAILY AS NEEDED FOR BACK SPASMS 180 tablet 1   Vitamin D, Ergocalciferol, (DRISDOL) 1.25 MG (50000 UNIT) CAPS capsule TAKE 1 CAPSULE BY MOUTH 1 TIME A WEEK 12 capsule 1   zonisamide (ZONEGRAN) 50 MG capsule Take 150 mg by mouth at bedtime.   2   No current facility-administered medications for this visit.    Allergies as of 10/30/2021 - Review Complete 10/30/2021  Allergen Reaction Noted   Benadryl [diphenhydramine] Anaphylaxis 03/25/2014   Penicillins Other (See Comments)    Latex Itching and Other (See Comments) 11/30/2011    Family History  Problem Relation Age of Onset   Lung cancer Mother 75   Cancer Father    Diabetes Father    Liver disease Father        liver transplant at [facility], age 5   Multiple sclerosis Sister 71    Depression Sister 49   Alcohol abuse Sister    Bipolar disorder Sister    Schizophrenia Sister    Heart disease Maternal Grandmother    Parkinson's disease Maternal Grandfather    Cancer - Colon Neg Hx  Social History   Socioeconomic History   Marital status: Significant Other    Spouse name: Not on file   Number of children: 0   Years of education: 9   Highest education level: 9th grade  Occupational History   Occupation: disability    Employer: UNEMPLOYED  Tobacco Use   Smoking status: Every Day    Packs/day: 0.25    Years: 15.00    Total pack years: 3.75    Types: Cigarettes   Smokeless tobacco: Never   Tobacco comments:    2 per day  Vaping Use   Vaping Use: Never used  Substance and Sexual Activity   Alcohol use: No    Alcohol/week: 0.0 standard drinks of alcohol   Drug use: Yes    Frequency: 2.0 times per week    Types: Marijuana    Comment: once or twice a week   Sexual activity: Yes    Birth control/protection: Post-menopausal  Other Topics Concern   Not on file  Social History Narrative   Not on file   Social Determinants of Health   Financial Resource Strain: Medium Risk (07/21/2021)   Overall Financial Resource Strain (CARDIA)    Difficulty of Paying Living Expenses: Somewhat hard  Food Insecurity: Food Insecurity Present (07/21/2021)   Hunger Vital Sign    Worried About Running Out of Food in the Last Year: Sometimes true    Ran Out of Food in the Last Year: Sometimes true  Transportation Needs: No Transportation Needs (07/21/2021)   PRAPARE - Hydrologist (Medical): No    Lack of Transportation (Non-Medical): No  Physical Activity: Insufficiently Active (07/21/2021)   Exercise Vital Sign    Days of Exercise per Week: 2 days    Minutes of Exercise per Session: 30 min  Stress: No Stress Concern Present (07/21/2021)   [place]    Feeling of Stress : Only a  little  Social Connections: Moderately Isolated (07/21/2021)   Social Connection and Isolation Panel [NHANES]    Frequency of Communication with Friends and Family: Once a week    Frequency of Social Gatherings with Friends and Family: Once a week    Attends Religious Services: 1 to 4 times per year    Active Member of [employer] or Organizations: No    Attends Archivist Meetings: Never    Marital Status: Living with partner    Review of Systems: Gen: Denies fever, chills,  cold or flu like symptoms, pre-syncope, or syncope.  CV: Denies chest pain, palpitations. Resp: Denies dyspnea at rest, cough. GI: See HPI Heme: See HPI  Physical Exam: BP 138/82 (BP Location: Right Arm, Patient Position: Sitting, Cuff Size: Normal)   Pulse 81   Temp 97.6 F (36.4 C) (Temporal)   Ht '5\' 6"'$  (1.676 m)   Wt 174 lb 9.6 oz (79.2 kg)   LMP  (LMP Unknown) Comment: pt reports premature menopause  SpO2 100%   BMI 28.18 kg/m  General:   Alert and oriented. No distress noted. Pleasant and cooperative.  Head:  Normocephalic and atraumatic. Eyes:  Conjuctiva clear without scleral icterus. Heart:  S1, S2 present without murmurs appreciated. Lungs:  Clear to auscultation bilaterally. No wheezes, rales, or rhonchi. No distress.  Abdomen:  +BS, soft, non-tender and non-distended. No rebound or guarding. No HSM or masses noted. Msk:  Symmetrical without gross deformities. Normal posture. Extremities:  Without edema. Neurologic:  Alert and  oriented x4 Psych:  Normal mood and affect.    Assessment:  41 y.o. female with history of constipation, GERD, gastroparesis, gastric outlet obstruction secondary to recurrent pylorospasm/pyloric stenosis requiring repeat EGDs with dilation/Botox injections with last in 2020, presenting today for follow-up.  GERD:  Well-controlled on pantoprazole 40 mg twice daily.  Uses Carafate about once a week for breakthrough symptoms.  No alarm  symptoms.  Gastroparesis: Doing very well on Reglan 2.5 mg twice daily.  No adverse reactions to Reglan.  Very rare use of Zofran.  We have been slowly decreasing Reglan to treat with the lowest effective dose.  We will again try reducing further to 2.5 mg once daily.  Advised patient to monitor for recurrent symptoms and let us know if this occurs.  Constipation: Chronic.  Well-controlled on Linzess 145 mcg daily.  No alarm symptoms.   Plan:  Continue pantoprazole 40 mg twice daily. Continue Carafate as needed. Reinforced GERD diet/lifestyle.  Separate instructions provided on AVS. Decrease Reglan to 2.5 mg once daily.  Patient will monitor for recurrent gastroparesis symptoms and let us know. Reinforced gastroparesis diet.  Separate written instructions provided on AVS. Continue Linzess 145 mcg daily. Follow-up in 6 months or sooner if needed.   Aliene Altes, PA-C [facility] 10/30/2021

## 2021-10-30 ENCOUNTER — Encounter: Payer: Self-pay | Admitting: Gastroenterology

## 2021-10-30 ENCOUNTER — Ambulatory Visit (INDEPENDENT_AMBULATORY_CARE_PROVIDER_SITE_OTHER): Payer: Medicare HMO | Admitting: Gastroenterology

## 2021-10-30 VITALS — BP 138/82 | HR 81 | Temp 97.6°F | Ht 66.0 in | Wt 174.6 lb

## 2021-10-30 DIAGNOSIS — K5900 Constipation, unspecified: Secondary | ICD-10-CM

## 2021-10-30 DIAGNOSIS — K3184 Gastroparesis: Secondary | ICD-10-CM | POA: Diagnosis not present

## 2021-10-30 DIAGNOSIS — K219 Gastro-esophageal reflux disease without esophagitis: Secondary | ICD-10-CM | POA: Diagnosis not present

## 2021-10-30 NOTE — Patient Instructions (Signed)
For reflux: Continue taking pantoprazole 40 mg twice daily 30 minutes before breakfast and dinner. You may continue to use Carafate as needed for breakthrough symptoms. Follow a GERD diet:  Avoid fried, fatty, greasy, spicy, citrus foods. Avoid caffeine and carbonated beverages. Avoid chocolate. Try eating 4-6 small meals a day rather than 3 large meals. Do not eat within 3 hours of laying down. Prop head of bed up on wood or bricks to create a 6 inch incline.  For gastroparesis: Try decreasing Reglan to 2.5 mg once daily.  Monitor for recurrent symptoms of abdominal discomfort, nausea, vomiting, feeling like you are getting full too quickly and let me know if this occurs. Follow a gastroparesis diet: 4-6 small meals daily Low fat diet Low fiber diet (avoid raw fruits and vegetables).   For constipation: Continue Linzess 145 mcg daily.  I am glad you are doing well overall!  We will plan to see you back in about 6 months.  Do not hesitate to call sooner if you have questions or concerns.  Aliene Altes, PA-C [facility]

## 2021-11-04 ENCOUNTER — Other Ambulatory Visit: Payer: Self-pay | Admitting: Family Medicine

## 2021-11-11 ENCOUNTER — Other Ambulatory Visit: Payer: Self-pay | Admitting: Gastroenterology

## 2021-11-11 DIAGNOSIS — K3184 Gastroparesis: Secondary | ICD-10-CM

## 2021-11-12 ENCOUNTER — Other Ambulatory Visit: Payer: Self-pay | Admitting: Adult Health

## 2021-11-17 ENCOUNTER — Other Ambulatory Visit: Payer: Self-pay | Admitting: Obstetrics & Gynecology

## 2021-11-17 DIAGNOSIS — M791 Myalgia, unspecified site: Secondary | ICD-10-CM | POA: Diagnosis not present

## 2021-11-17 DIAGNOSIS — G518 Other disorders of facial nerve: Secondary | ICD-10-CM | POA: Diagnosis not present

## 2021-11-17 DIAGNOSIS — M542 Cervicalgia: Secondary | ICD-10-CM | POA: Diagnosis not present

## 2021-11-17 DIAGNOSIS — G43719 Chronic migraine without aura, intractable, without status migrainosus: Secondary | ICD-10-CM | POA: Diagnosis not present

## 2021-11-21 ENCOUNTER — Other Ambulatory Visit: Payer: Self-pay | Admitting: Family Medicine

## 2021-11-21 NOTE — Telephone Encounter (Signed)
Please advise 

## 2021-11-28 DIAGNOSIS — F333 Major depressive disorder, recurrent, severe with psychotic symptoms: Secondary | ICD-10-CM | POA: Diagnosis not present

## 2021-12-02 ENCOUNTER — Ambulatory Visit (INDEPENDENT_AMBULATORY_CARE_PROVIDER_SITE_OTHER): Payer: Medicare HMO | Admitting: Family Medicine

## 2021-12-02 ENCOUNTER — Encounter: Payer: Self-pay | Admitting: Family Medicine

## 2021-12-02 VITALS — BP 140/90 | HR 80 | Ht 66.0 in | Wt 177.0 lb

## 2021-12-02 DIAGNOSIS — Z23 Encounter for immunization: Secondary | ICD-10-CM

## 2021-12-02 DIAGNOSIS — F17218 Nicotine dependence, cigarettes, with other nicotine-induced disorders: Secondary | ICD-10-CM

## 2021-12-02 DIAGNOSIS — L659 Nonscarring hair loss, unspecified: Secondary | ICD-10-CM | POA: Diagnosis not present

## 2021-12-02 DIAGNOSIS — H9201 Otalgia, right ear: Secondary | ICD-10-CM | POA: Diagnosis not present

## 2021-12-02 DIAGNOSIS — I10 Essential (primary) hypertension: Secondary | ICD-10-CM | POA: Diagnosis not present

## 2021-12-02 MED ORDER — MINOXIDIL 2.5 MG PO TABS: 2.5000 mg | ORAL_TABLET | Freq: Every day | ORAL | 3 refills | Status: DC

## 2021-12-02 MED ORDER — CIPROFLOXACIN-DEXAMETHASONE 0.3-0.1 % OT SUSP: OTIC | 0 refills | Status: DC

## 2021-12-02 NOTE — Progress Notes (Signed)
   Tracey Daniel     MRN: [id number]      DOB: 1980-06-04   HPI Tracey Daniel is here for follow up and re-evaluation of chronic medical conditions, medication management and review of any available recent lab and radiology data.  Preventive health is updated, specifically  Cancer screening and Immunization.   Questions or concerns regarding consultations or procedures which the PT has had in the interim are  addressed. The PT denies any adverse reactions to current medications since the last visit.  C/o  chronic right ear pain with impacted wax Needs new dermatologist for alopecia  ROS Denies recent fever or chills. Denies sinus pressure, nasal congestion, or sore throat. Denies chest congestion, productive cough or wheezing. Denies chest pains, palpitations and leg swelling Denies abdominal pain, nausea, vomiting,diarrhea or constipation.   Denies dysuria, frequency, hesitancy or incontinence. Denies joint pain, swelling and limitation in mobility. Denies headaches, seizures, numbness, or tingling. Denies  uncontrolled depression, anxiety or insomnia. Denies skin break down or rash.   PE  BP (!) 140/90   Pulse 80   Ht '5\' 6"'$  (1.676 m)   Wt 177 lb (80.3 kg)   LMP  (LMP Unknown) Comment: pt reports premature menopause  SpO2 99%   BMI 28.57 kg/m   Patient alert and oriented and in no cardiopulmonary distress.  HEENT: No facial asymmetry, EOMI,     Neck supple .TM clear bilaterally  Chest: Clear to auscultation bilaterally.  CVS: S1, S2 no murmurs, no S3.Regular rate.  ABD: Soft non tender.   Ext: No edema  MS: Adequate ROM spine, shoulders, hips and knees.  Skin: Intact, no ulcerations or rash noted.  Psych: Good eye contact, normal affect. Memory intact not anxious or depressed appearing.  CNS: CN 2-12 intact, power,  normal throughout.no focal deficits noted.   Assessment & Plan  Nicotine dependence, cigarettes, with other nicotine-induced  disorders Asked:confirms currently smokes cigarettes approx 7 / day Assess: Unwilling to set a quit date, but is cutting back Advise: needs to QUIT to reduce risk of cancer, cardio and cerebrovascular disease Assist: counseled for 5 minutes and literature provided Arrange: follow up in 2 to 4 months   Essential hypertension Uncontrolled, add minoxidil DASH diet and commitment to daily physical activity for a minimum of 30 minutes discussed and encouraged, as a part of hypertension management. The importance of attaining a healthy weight is also discussed.     12/02/2021   11:16 AM 12/02/2021   10:55 AM 12/02/2021   10:51 AM 10/30/2021    8:39 AM 08/14/2021    9:25 AM 08/14/2021    8:47 AM 07/21/2021    8:38 AM  BP/Weight  Systolic BP 791 505 697 948 016 553 748  Diastolic BP 90 82 83 82 86 86 83  Wt. (Lbs)   177 174.6  165.12 171  BMI   28.57 kg/m2 28.18 kg/m2  26.65 kg/m2 27.6 kg/m2       Alopecia Needs to establish with new dermatologist

## 2021-12-02 NOTE — Patient Instructions (Addendum)
F/u in 6 weeks, call if you need me sooner , re evaluate blood pressure  New additional medication for blood pressure is minoxidil , one daily  Please work on cutting back on smoking and beer  Need to reduce calorie intake your weight is increased by 12 pounds in last 4 months  You are referred to ENT re chronic rigth ear pain, and to dermatology re alopecia  Ear drop for right ear is prescribed  Thanks for choosing [facility], we consider it a privelige to serve you.

## 2021-12-02 NOTE — Assessment & Plan Note (Signed)
Asked:confirms currently smokes cigarettes approx 7 / day Assess: Unwilling to set a quit date, but is cutting back Advise: needs to QUIT to reduce risk of cancer, cardio and cerebrovascular disease Assist: counseled for 5 minutes and literature provided Arrange: follow up in 2 to 4 months

## 2021-12-02 NOTE — Assessment & Plan Note (Signed)
Needs to establish with new dermatologist

## 2021-12-02 NOTE — Assessment & Plan Note (Signed)
Uncontrolled, add minoxidil DASH diet and commitment to daily physical activity for a minimum of 30 minutes discussed and encouraged, as a part of hypertension management. The importance of attaining a healthy weight is also discussed.     12/02/2021   11:16 AM 12/02/2021   10:55 AM 12/02/2021   10:51 AM 10/30/2021    8:39 AM 08/14/2021    9:25 AM 08/14/2021    8:47 AM 07/21/2021    8:38 AM  BP/Weight  Systolic BP 337 445 146 047 998 721 587  Diastolic BP 90 82 83 82 86 86 83  Wt. (Lbs)   177 174.6  165.12 171  BMI   28.57 kg/m2 28.18 kg/m2  26.65 kg/m2 27.6 kg/m2

## 2021-12-11 ENCOUNTER — Other Ambulatory Visit: Payer: Self-pay | Admitting: Gastroenterology

## 2021-12-11 DIAGNOSIS — K219 Gastro-esophageal reflux disease without esophagitis: Secondary | ICD-10-CM

## 2021-12-26 ENCOUNTER — Other Ambulatory Visit: Payer: Self-pay | Admitting: Family Medicine

## 2022-01-05 DIAGNOSIS — G43719 Chronic migraine without aura, intractable, without status migrainosus: Secondary | ICD-10-CM | POA: Diagnosis not present

## 2022-01-05 DIAGNOSIS — M542 Cervicalgia: Secondary | ICD-10-CM | POA: Diagnosis not present

## 2022-01-05 DIAGNOSIS — M791 Myalgia, unspecified site: Secondary | ICD-10-CM | POA: Diagnosis not present

## 2022-01-05 DIAGNOSIS — G518 Other disorders of facial nerve: Secondary | ICD-10-CM | POA: Diagnosis not present

## 2022-01-15 ENCOUNTER — Ambulatory Visit: Payer: Medicare HMO | Admitting: Family Medicine

## 2022-01-17 ENCOUNTER — Other Ambulatory Visit: Payer: Self-pay | Admitting: Family Medicine

## 2022-01-19 ENCOUNTER — Other Ambulatory Visit: Payer: Self-pay | Admitting: Family Medicine

## 2022-01-29 ENCOUNTER — Ambulatory Visit (HOSPITAL_COMMUNITY)
Admission: RE | Admit: 2022-01-29 | Discharge: 2022-01-29 | Disposition: A | Discharge status: Home / Self Care | Payer: Medicare HMO | Source: Ambulatory Visit | Attending: Family Medicine | Admitting: Family Medicine

## 2022-01-29 ENCOUNTER — Encounter: Payer: Self-pay | Admitting: Family Medicine

## 2022-01-29 ENCOUNTER — Ambulatory Visit (INDEPENDENT_AMBULATORY_CARE_PROVIDER_SITE_OTHER): Payer: Medicare HMO | Admitting: Family Medicine

## 2022-01-29 VITALS — BP 120/80 | HR 79 | Ht 66.0 in | Wt 180.0 lb

## 2022-01-29 DIAGNOSIS — F17218 Nicotine dependence, cigarettes, with other nicotine-induced disorders: Secondary | ICD-10-CM | POA: Diagnosis not present

## 2022-01-29 DIAGNOSIS — M25562 Pain in left knee: Secondary | ICD-10-CM

## 2022-01-29 DIAGNOSIS — Z9109 Other allergy status, other than to drugs and biological substances: Secondary | ICD-10-CM | POA: Diagnosis not present

## 2022-01-29 DIAGNOSIS — E559 Vitamin D deficiency, unspecified: Secondary | ICD-10-CM

## 2022-01-29 DIAGNOSIS — I10 Essential (primary) hypertension: Secondary | ICD-10-CM | POA: Diagnosis not present

## 2022-01-29 DIAGNOSIS — E782 Mixed hyperlipidemia: Secondary | ICD-10-CM

## 2022-01-29 MED ORDER — NICOTINE 7 MG/24HR TD PT24: 7.0000 mg | MEDICATED_PATCH | Freq: Every day | TRANSDERMAL | 0 refills | Status: DC

## 2022-01-29 MED ORDER — MINOXIDIL 2.5 MG PO TABS: 2.5000 mg | ORAL_TABLET | Freq: Every day | ORAL | 3 refills | Status: DC

## 2022-01-29 MED ORDER — PREDNISONE 10 MG PO TABS: 10.0000 mg | ORAL_TABLET | Freq: Two times a day (BID) | ORAL | 0 refills | Status: DC

## 2022-01-29 NOTE — Assessment & Plan Note (Signed)
Controlled, no change in medication DASH diet and commitment to daily physical activity for a minimum of 30 minutes discussed and encouraged, as a part of hypertension management. The importance of attaining a healthy weight is also discussed.     01/29/2022    9:10 AM 01/29/2022    9:09 AM 01/29/2022    8:47 AM 12/02/2021   11:16 AM 12/02/2021   10:55 AM 12/02/2021   10:51 AM 10/30/2021    8:39 AM  BP/Weight  Systolic BP 033 533 174 099 278 004 471  Diastolic BP 80 80 84 90 82 83 82  Wt. (Lbs)   180   177 174.6  BMI   29.05 kg/m2   28.57 kg/m2 28.18 kg/m2

## 2022-01-29 NOTE — Progress Notes (Signed)
Tracey Daniel     MRN: [id number]      DOB: 1980/02/26   HPI Tracey Daniel is here for follow up and re-evaluation of chronic medical conditions, medication management and review of any available recent lab and radiology data.  Preventive health is updated, specifically  Cancer screening and Immunization.   Questions or concerns regarding consultations or procedures which the PT has had in the interim are  addressed. The PT denies any adverse reactions to current medications since the last visit.  There are no new concerns.  There are no specific complaints   ROS Denies recent fever or chills. Denies sinus pressure, nasal congestion, ear pain or sore throat. Denies chest congestion, productive cough or wheezing. Denies chest pains, palpitations and leg swelling Denies abdominal pain, nausea, vomiting,diarrhea or constipation.   Denies dysuria, frequency, hesitancy or incontinence. 1 week h/o acute left knee pain Denies headaches, seizures, numbness, or tingling. C/o  depression, anxiety or insomnia.Needs a therapist, reports depression x 2 years, drinks beer to compensate, not suicidal or homicidal Denies skin break down or rash.   PE  BP 120/80   Pulse 79   Ht '5\' 6"'$  (1.676 m)   Wt 180 lb (81.6 kg)   LMP  (LMP Unknown) Comment: pt reports premature menopause  SpO2 98%   BMI 29.05 kg/m   Patient alert and oriented and in no cardiopulmonary distress.  HEENT: No facial asymmetry, EOMI,     Neck supple .  Chest: Clear to auscultation bilaterally.  CVS: S1, S2 no murmurs, no S3.Regular rate.  ABD: Soft non tender.   Ext: No edema  MS: Adequate ROM spine, shoulders, hips and reduced in left knees which is tender on medical aspect  Skin: Intact, no ulcerations or rash noted.  Psych: Good eye contact, normal affect. Memory intact mildly  anxious and  depressed appearing.  CNS: CN 2-12 intact, power,  normal throughout.no focal deficits noted.   Assessment &  Plan  Essential hypertension Controlled, no change in medication DASH diet and commitment to daily physical activity for a minimum of 30 minutes discussed and encouraged, as a part of hypertension management. The importance of attaining a healthy weight is also discussed.     01/29/2022    9:10 AM 01/29/2022    9:09 AM 01/29/2022    8:47 AM 12/02/2021   11:16 AM 12/02/2021   10:55 AM 12/02/2021   10:51 AM 10/30/2021    8:39 AM  BP/Weight  Systolic BP 350 093 [phone] 789  Diastolic BP 80 80 84 90 82 83 82  Wt. (Lbs)   180   177 174.6  BMI   29.05 kg/m2   28.57 kg/m2 28.18 kg/m2       Nicotine dependence, cigarettes, with other nicotine-induced disorders Asked:confirms currently smokes cigarettes Assess: willing to set a quit date, but is cutting back Advise: needs to QUIT to reduce risk of cancer, cardio and cerebrovascular disease Assist: counseled for 5 minutes and literature provided Arrange: follow up in 2 to 4 months Rx sent for patches to start in her b/day  Acute pain of left knee X ray and short course of prednisone  Environmental allergies Currently uncontrolled, double dose of claritin and short course of prednisone  Mixed hyperlipidemia Hyperlipidemia:Low fat diet discussed and encouraged.   Lipid Panel  Lab Results  Component Value Date   CHOL 97 (L) 01/29/2022   HDL 32 (L) 01/29/2022   LDLCALC 29 01/29/2022  TRIG 231 (H) 01/29/2022   CHOLHDL 3.0 01/29/2022     Needs to reduce fat in diet  Vitamin D deficiency Updated lab needed , adequately corrected, continue current management

## 2022-01-29 NOTE — Patient Instructions (Signed)
F/U in 4 months, call if you need me sooner  X ray of  knee today, and short course of prednisone is prescribed  Increase claritin to one twice daily, continue nose sprays  Labs today lipid, cmp and EGFr and vit D  Cut back on beer  Happy Birthday and best b/day gift to yourself as planned, QUIT smoking and you may use the patches prescribed for 1 month  Thanks for choosing [facility], we consider it a privelige to serve you.

## 2022-01-30 LAB — CMP14+EGFR
ALT: 21 IU/L (ref 0–32)
AST: 27 IU/L (ref 0–40)
Albumin/Globulin Ratio: 1.8 (ref 1.2–2.2)
Albumin: 4.5 g/dL (ref 3.9–4.9)
Alkaline Phosphatase: 77 IU/L (ref 44–121)
BUN/Creatinine Ratio: 16 (ref 9–23)
BUN: 15 mg/dL (ref 6–24)
Bilirubin Total: <0.2 mg/dL (ref 0.0–1.2)
CO2: 18 mmol/L — ABNORMAL LOW (ref 20–29)
Calcium: 9.5 mg/dL (ref 8.7–10.2)
Chloride: 102 mmol/L (ref 96–106)
Creatinine, Ser: 0.91 mg/dL (ref 0.57–1.00)
Globulin, Total: 2.5 g/dL (ref 1.5–4.5)
Glucose: 85 mg/dL (ref 70–99)
Potassium: 4.2 mmol/L (ref 3.5–5.2)
Sodium: 137 mmol/L (ref 134–144)
Total Protein: 7 g/dL (ref 6.0–8.5)
eGFR: 80 mL/min/{1.73_m2} (ref >59)

## 2022-01-30 LAB — LIPID PANEL
Chol/HDL Ratio: 3 ratio (ref 0.0–4.4)
Cholesterol, Total: 97 mg/dL — ABNORMAL LOW (ref 100–199)
HDL: 32 mg/dL — ABNORMAL LOW (ref >39)
LDL Chol Calc (NIH): 29 mg/dL (ref 0–99)
Triglycerides: 231 mg/dL — ABNORMAL HIGH (ref 0–149)
VLDL Cholesterol Cal: 36 mg/dL (ref 5–40)

## 2022-01-30 LAB — VITAMIN D 25 HYDROXY (VIT D DEFICIENCY, FRACTURES): Vit D, 25-Hydroxy: 43.8 ng/mL (ref 30.0–100.0)

## 2022-02-02 ENCOUNTER — Encounter: Payer: Self-pay | Admitting: Family Medicine

## 2022-02-02 DIAGNOSIS — E782 Mixed hyperlipidemia: Secondary | ICD-10-CM | POA: Insufficient documentation

## 2022-02-02 DIAGNOSIS — Z9109 Other allergy status, other than to drugs and biological substances: Secondary | ICD-10-CM | POA: Insufficient documentation

## 2022-02-02 DIAGNOSIS — M25562 Pain in left knee: Secondary | ICD-10-CM | POA: Insufficient documentation

## 2022-02-02 NOTE — Assessment & Plan Note (Signed)
Hyperlipidemia:Low fat diet discussed and encouraged.   Lipid Panel  Lab Results  Component Value Date   CHOL 97 (L) 01/29/2022   HDL 32 (L) 01/29/2022   LDLCALC 29 01/29/2022   TRIG 231 (H) 01/29/2022   CHOLHDL 3.0 01/29/2022     Needs to reduce fat in diet

## 2022-02-02 NOTE — Assessment & Plan Note (Signed)
Updated lab needed , adequately corrected, continue current management

## 2022-02-02 NOTE — Assessment & Plan Note (Signed)
X ray and short course of prednisone

## 2022-02-02 NOTE — Assessment & Plan Note (Signed)
Asked:confirms currently smokes cigarettes Assess: willing to set a quit date, but is cutting back Advise: needs to QUIT to reduce risk of cancer, cardio and cerebrovascular disease Assist: counseled for 5 minutes and literature provided Arrange: follow up in 2 to 4 months Rx sent for patches to start in her b/day

## 2022-02-02 NOTE — Assessment & Plan Note (Signed)
Currently uncontrolled, double dose of claritin and short course of prednisone

## 2022-02-09 ENCOUNTER — Ambulatory Visit: Payer: Medicare HMO | Admitting: Dermatology

## 2022-02-15 ENCOUNTER — Other Ambulatory Visit: Payer: Self-pay | Admitting: Family Medicine

## 2022-02-16 DIAGNOSIS — G43719 Chronic migraine without aura, intractable, without status migrainosus: Secondary | ICD-10-CM | POA: Diagnosis not present

## 2022-02-16 DIAGNOSIS — M542 Cervicalgia: Secondary | ICD-10-CM | POA: Diagnosis not present

## 2022-02-16 DIAGNOSIS — M791 Myalgia, unspecified site: Secondary | ICD-10-CM | POA: Diagnosis not present

## 2022-02-16 DIAGNOSIS — G518 Other disorders of facial nerve: Secondary | ICD-10-CM | POA: Diagnosis not present

## 2022-02-20 DIAGNOSIS — F251 Schizoaffective disorder, depressive type: Secondary | ICD-10-CM | POA: Diagnosis not present

## 2022-02-27 ENCOUNTER — Other Ambulatory Visit: Payer: Self-pay | Admitting: Family Medicine

## 2022-03-02 ENCOUNTER — Ambulatory Visit (INDEPENDENT_AMBULATORY_CARE_PROVIDER_SITE_OTHER): Payer: Medicare HMO

## 2022-03-02 DIAGNOSIS — Z0000 Encounter for general adult medical examination without abnormal findings: Secondary | ICD-10-CM

## 2022-03-02 NOTE — Patient Instructions (Signed)
  Ms. Bergman , Thank you for taking time to come for your Medicare Wellness Visit. I appreciate your ongoing commitment to your health goals. Please review the following plan we discussed and let me know if I can assist you in the future.   These are the goals we discussed:  Goals      DIET - INCREASE LEAN PROTEINS     Exercise 3x per week (30 min per time)     Recommend increasing your exercise program at least 3 days a week for 30-45 minutes at a time as tolerated.       Patient Stated     Per pt. She would like to try to get out more     Patient Stated     Stop smoking     Quit Smoking     Quit Smoking        This is a list of the screening recommended for you and due dates:  Health Maintenance  Topic Date Due   DTaP/Tdap/Td vaccine (3 - Td or Tdap) 08/20/2020   COVID-19 Vaccine (4 - 2023-24 season) 09/19/2021   Mammogram  08/01/2022   Medicare Annual Wellness Visit  03/03/2023   Pap Smear  07/21/2024   Flu Shot  Completed   Hepatitis C Screening: USPSTF Recommendation to screen - Ages 18-79 yo.  Completed   HIV Screening  Completed   HPV Vaccine  Aged Out

## 2022-03-02 NOTE — Progress Notes (Signed)
Subjective:   Tracey Daniel is a 42 y.o. female who presents for Medicare Annual (Subsequent) preventive examination.  Review of Systems    I connected with  Tracey Daniel on 03/02/22 by a audio enabled telemedicine application and verified that I am speaking with the correct person using two identifiers.  Patient Location: Home  Provider Location: Office/Clinic  I discussed the limitations of evaluation and management by telemedicine. The patient expressed understanding and agreed to proceed.        Objective:    There were no vitals filed for this visit. There is no height or weight on file to calculate BMI.     07/20/2021    9:30 AM 11/22/2020    9:09 AM 11/13/2019    9:25 AM 05/09/2019    8:44 AM 04/26/2019    7:07 PM 11/11/2018    8:13 AM 06/01/2018    2:18 PM  Advanced Directives  Does Patient Have a Medical Advance Directive? No No No No No No No  Would patient like information on creating a medical advance directive?  No - Patient declined No - Patient declined Yes (MAU/Ambulatory/Procedural Areas - Information given) No - Patient declined      Current Medications (verified) Outpatient Encounter Medications as of 03/02/2022  Medication Sig   acetaminophen (TYLENOL) 325 MG tablet Take 2 tablets (650 mg total) by mouth every 6 (six) hours as needed for mild pain (or Fever >/= 101).   albuterol (VENTOLIN HFA) 108 (90 Base) MCG/ACT inhaler Inhale 2 puffs into the lungs every 6 (six) hours as needed for wheezing or shortness of breath.   azelastine (ASTELIN) 0.1 % nasal spray USE 2 SPRAYS IN EACH NOSTRIL TWICE DAILY AS DIRECTED   budesonide-formoterol (SYMBICORT) 80-4.5 MCG/ACT inhaler Inhale 2 puffs into the lungs 2 (two) times daily.   buPROPion (WELLBUTRIN SR) 150 MG 12 hr tablet TAKE 1 TABLET(150 MG) BY MOUTH TWICE DAILY   calcium-vitamin D (OSCAL WITH D) 500-5 MG-MCG tablet Take 1 tablet by mouth 2 (two) times daily.   clotrimazole-betamethasone (LOTRISONE)  cream Apply sparingly to rash on face once daily for 1 week   denosumab (PROLIA) 60 MG/ML SOSY injection Inject 60 mg into the skin every 6 (six) months.   diclofenac (VOLTAREN) 75 MG EC tablet TAKE 1 TABLET ONE TIME DAILY FOR CHRONIC BACK PAIN (DOSE REDUCTION)   estradiol (ESTRACE) 2 MG tablet TAKE 1 TABLET(2 MG) BY MOUTH DAILY   feeding supplement (ENSURE CLINICAL STRENGTH) LIQD Take 237 mLs by mouth 3 (three) times daily with meals.   fenofibrate (TRICOR) 48 MG tablet TAKE 1 TABLET(48 MG) BY MOUTH DAILY   fluticasone (FLONASE) 50 MCG/ACT nasal spray Place 2 sprays into both nostrils daily.   gabapentin (NEURONTIN) 100 MG capsule TAKE 1 CAPSULE(100 MG) BY MOUTH THREE TIMES DAILY   linaclotide (LINZESS) 145 MCG CAPS capsule Take 1 capsule (145 mcg total) by mouth daily before breakfast.   megestrol (MEGACE) 40 MG tablet TAKE 3 TABLETS BY MOUTH DAILY FOR 5 DAYS, 2 TABLETS DAILY FOR 5 DAYS, THEN 1 TABLET DAILY   metoCLOPramide (REGLAN) 5 MG tablet TAKE 1/2 TABLET(2.5 MG) BY MOUTH ONCE DAILY BEFORE A MEAL. MAY TAKE A SECOND DOSE IF NEEDED.   minocycline (MINOCIN) 50 MG capsule Take 1 capsule (50 mg total) by mouth 2 (two) times daily.   minoxidil (LONITEN) 2.5 MG tablet TAKE 1 TABLET(2.5 MG) BY MOUTH DAILY   nicotine (NICODERM CQ - DOSED IN MG/24 HR) 7 mg/24hr patch  Place 1 patch (7 mg total) onto the skin daily.   ondansetron (ZOFRAN-ODT) 4 MG disintegrating tablet Take 1 tablet (4 mg total) by mouth every 8 (eight) hours as needed for nausea or vomiting.   pantoprazole (PROTONIX) 40 MG tablet TAKE 1 TABLET(40 MG) BY MOUTH TWICE DAILY BEFORE A MEAL   potassium chloride SA (KLOR-CON M) 20 MEQ tablet TAKE 1 TABLET BY MOUTH EVERY DAY   prazosin (MINIPRESS) 1 MG capsule    predniSONE (DELTASONE) 10 MG tablet Take 1 tablet (10 mg total) by mouth 2 (two) times daily with a meal.   progesterone (PROMETRIUM) 200 MG capsule TAKE ONE CAPSULE BY MOUTH DAILY AT BEDTIME; DAYS 1 THROUGH 21 OF EACH MONTH.    propranolol (INDERAL) 10 MG tablet Take 1 tablet (10 mg total) by mouth 3 (three) times daily.   risperiDONE (RISPERDAL) 0.5 MG tablet Take 0.5 mg at bedtime by mouth.   rosuvastatin (CRESTOR) 20 MG tablet TAKE 1 TABLET(20 MG) BY MOUTH DAILY   sertraline (ZOLOFT) 50 MG tablet Take 50 mg by mouth at bedtime.    sucralfate (CARAFATE) 1 g tablet One tablet every morning and at bedtime for stomach burning.   tiZANidine (ZANAFLEX) 4 MG tablet TAKE 1 TABLET(4 MG) BY MOUTH TWICE DAILY AS NEEDED FOR BACK SPASMS   Vitamin D, Ergocalciferol, (DRISDOL) 1.25 MG (50000 UNIT) CAPS capsule TAKE 1 CAPSULE BY MOUTH 1 TIME A WEEK   zonisamide (ZONEGRAN) 50 MG capsule Take 150 mg by mouth at bedtime.    No facility-administered encounter medications on file as of 03/02/2022.    Allergies (verified) Benadryl [diphenhydramine], Penicillins, and Latex   History: Past Medical History:  Diagnosis Date   Alopecia    Anemia of other chronic disease 11/29/2012   Asthma    Back pain, chronic    Bronchitis    Chronic abdominal pain    Constipation    COPD (chronic obstructive pulmonary disease) (HCC)    Cyst of skin    mid spine area   Depression 2000   h/o suicidal ideation   Gastric outlet obstruction    Gastritis    Gastroparesis    GERD (gastroesophageal reflux disease)    LGSIL of cervix of undetermined significance 07/08/2020   07/08/20 pt aware will get colpo, per [facility]   Migraine    Migraines    Nausea and vomiting    recurrent   Nicotine addiction    Osteoporosis    Peptic ulcer disease    Pyloric spasm 03/30/2011   Seasonal allergies    Sinusitis    Substance abuse ([place]) 2008   marijuana   Vaginal Pap smear, abnormal    Vitamin D deficiency    Past Surgical History:  Procedure Laterality Date   BALLOON DILATION N/A 02/09/2013   Procedure: BALLOON DILATION;  Surgeon: Missy Sabins, MD;  Location: [facility];  Service: Endoscopy;  Laterality: N/A;   BALLOON DILATION N/A 03/10/2018    Procedure: BALLOON DILATION;  Surgeon: Daneil Dolin, MD;  Location: [facility];  Service: Endoscopy;  Laterality: N/A;   BILIARY DILATION  06/01/2018   Procedure: PYLORIC DILATION;  Surgeon: Rush Landmark Telford Nab., MD;  Location[facility]COPY;  Service: Gastroenterology;;   BIOPSY  06/01/2018   Procedure: BIOPSY;  Surgeon: Irving Copas., MD;  Location: [facility] ENDOSCOPY;  Service: Gastroenterology;;   BOTOX INJECTION N/A 02/09/2013   Procedure: BOTOX INJECTION;  Surgeon: Missy Sabins, MD;  Location: [facility];  Service: Endoscopy;  Laterality: N/A;  possible balloon   BOTOX INJECTION N/A 10/24/2015   Procedure: BOTOX INJECTION;  Surgeon: Daneil Dolin, MD;  [facility];  Service: Endoscopy;  Laterality: N/A;   BOTOX INJECTION N/A 03/10/2018   Procedure: BOTOX INJECTION;  Surgeon: Daneil Dolin, MD;  [facility];  Service: Endoscopy;  Laterality: N/A;  Melanie notified   BOTOX INJECTION  06/01/2018   Procedure: BOTOX INJECTION;  Surgeon: Rush Landmark Telford Nab., MD;  Location: [place];  Service: Gastroenterology;;   CARPAL TUNNEL RELEASE     left hand   COLONOSCOPY WITH PROPOFOL N/A 09/20/2014   RMR: Normal ileo-colonoscopy   ESOPHAGEAL DILATION  12/25/2010   Procedure: ESOPHAGEAL DILATION;  Surgeon: Dorothyann Peng, MD;  [facility];  Service: Endoscopy;;   ESOPHAGEAL MANOMETRY N/A 10/03/2018   Surgeon: Mauri Pole, MD;  Normal.   ESOPHAGOGASTRODUODENOSCOPY  12/25/2010   Dorothyann Peng, MD;  moderate gastritis, ?goo secondary to pylorspasm. BX showed reactive gstropathy no h.pyori, SB mucosa with intramucosal lymphocytosis and partial villous blunting (TTG 4.0 normal)   ESOPHAGOGASTRODUODENOSCOPY N/A 11/14/2012   [id number] hiatal hernia. Abnormal gastric mucosa of  uncertain significance-status post biopsy. Subjectively, patient may have recurrent symptomatic, pylorospasm.   ESOPHAGOGASTRODUODENOSCOPY (EGD) WITH PROPOFOL N/A  02/09/2013   elongated stomach, partial lower esophageal ring widely patent. No obvious pyloric stenosis s/p Botox   ESOPHAGOGASTRODUODENOSCOPY (EGD) WITH PROPOFOL N/A 10/24/2015   Procedure: ESOPHAGOGASTRODUODENOSCOPY (EGD) WITH PROPOFOL;  Surgeon: Daneil Dolin, MD;  [facility];  Service: Endoscopy;  Laterality: N/A;  830    ESOPHAGOGASTRODUODENOSCOPY (EGD) WITH PROPOFOL N/A 03/10/2018   Dr. Gala Romney: normal esophagus, elongated, dilated stomach likely stigmata of slow gastric emptying due to narrowing of pyloric channel, s/p balloon dilatation of pyloric channel. Small hiatal hernia, normal duodenal bulb and second portion of duodenum   ESOPHAGOGASTRODUODENOSCOPY (EGD) WITH PROPOFOL N/A 06/01/2018   Surgeon: Irving Copas., MD; normal esophagus s/p biopsy, small hiatal hernia, erythematous mucosa in the cardia, gastric fundus, gastric body, erosive gastropathy s/p biopsy, normal pylorus s/p dilation and Botox injection, normal duodenum s/p biopsy.  Surgical pathology all benign.   LAPAROSCOPIC CHOLECYSTECTOMY  2002   [facility]?  [place]?   laser surgery on cervix     NASAL SEPTUM SURGERY  01/29/2017   TOE SURGERY     Family History  Problem Relation Age of Onset   Lung cancer Mother 51   Cancer Father    Diabetes Father    Liver disease Father        liver transplant at [facility], age 44   Multiple sclerosis Sister 4   Depression Sister 48   Alcohol abuse Sister    Bipolar disorder Sister    Schizophrenia Sister    Heart disease Maternal Grandmother    Parkinson's disease Maternal Grandfather    Cancer - Colon Neg Hx    Social History   Socioeconomic History   Marital status: Significant Other    Spouse name: Not on file   Number of children: 0   Years of education: 9   Highest education level: 9th grade  Occupational History   Occupation: disability    Fish farm manager: UNEMPLOYED  Tobacco Use   Smoking status: Every Day    Packs/day: 0.25    Years: 15.00     Total pack years: 3.75    Types: Cigarettes   Smokeless tobacco: Never   Tobacco comments:    2 per day  Vaping Use   Vaping  Use: Never used  Substance and Sexual Activity   Alcohol use: No    Alcohol/week: 0.0 standard drinks of alcohol   Drug use: Yes    Frequency: 2.0 times per week    Types: Marijuana    Comment: once or twice a week   Sexual activity: Yes    Birth control/protection: Post-menopausal  Other Topics Concern   Not on file  Social History Narrative   Not on file   Social Determinants of Health   Financial Resource Strain: Medium Risk (07/21/2021)   Overall Financial Resource Strain (CARDIA)    Difficulty of Paying Living Expenses: Somewhat hard  Food Insecurity: Food Insecurity Present (07/21/2021)   Hunger Vital Sign    Worried About Running Out of Food in the Last Year: Sometimes true    Ran Out of Food in the Last Year: Sometimes true  Transportation Needs: No Transportation Needs (07/21/2021)   PRAPARE - Hydrologist (Medical): No    Lack of Transportation (Non-Medical): No  Physical Activity: Insufficiently Active (07/21/2021)   Exercise Vital Sign    Days of Exercise per Week: 2 days    Minutes of Exercise per Session: 30 min  Stress: No Stress Concern Present (07/21/2021)   [place]    Feeling of Stress : Only a little  Social Connections: Moderately Isolated (07/21/2021)   Social Connection and Isolation Panel [NHANES]    Frequency of Communication with Friends and Family: Once a week    Frequency of Social Gatherings with Friends and Family: Once a week    Attends Religious Services: 1 to 4 times per year    Active Member of [employer] or Organizations: No    Attends Archivist Meetings: Never    Marital Status: Living with partner    Tobacco Counseling Ready to quit: Not Answered Counseling given: Not Answered Tobacco comments: 2 per  day   Clinical Intake:  Tracey Daniel , Thank you for taking time to come for your Medicare Wellness Visit. I appreciate your ongoing commitment to your health goals. Please review the following plan we discussed and let me know if I can assist you in the future.   These are the goals we discussed:  Goals      DIET - INCREASE LEAN PROTEINS     Exercise 3x per week (30 min per time)     Recommend increasing your exercise program at least 3 days a week for 30-45 minutes at a time as tolerated.       Patient Stated     Per pt. She would like to try to get out more     Patient Stated     Stop smoking     Quit Smoking     Quit Smoking        This is a list of the screening recommended for you and due dates:  Health Maintenance  Topic Date Due   DTaP/Tdap/Td vaccine (3 - Td or Tdap) 08/20/2020   COVID-19 Vaccine (4 - 2023-24 season) 09/19/2021   Mammogram  08/01/2022   Medicare Annual Wellness Visit  03/03/2023   Pap Smear  07/21/2024   Flu Shot  Completed   Hepatitis C Screening: USPSTF Recommendation to screen - Ages 18-79 yo.  Completed   HIV Screening  Completed   HPV Vaccine  Aged Out          Diabetic?no  Activities of Daily Living     No data to display           Patient Care Team: [name], MD as PCP - General (Family Medicine) Gala Romney, Cristopher Estimable, MD (Gastroenterology) Whitney Muse, Kelby Fam, MD (Inactive) as Consulting Physician (Hematology and Oncology) Scherrie November, MD as Referring Physician (Gastroenterology) Mansouraty, Telford Nab., MD as Consulting Physician (Gastroenterology) Michael Boston, MD as Consulting Physician (General Surgery) Lavonna Monarch, MD (Inactive) as Consulting Physician (Dermatology)  Indicate any recent Medical Services you may have received from other than [facility] providers in the past year (date may be approximate).     Assessment:   This is a routine wellness examination for [place].  Hearing/Vision  screen No results found.  Dietary issues and exercise activities discussed:     Goals Addressed   None   Depression Screen    01/29/2022    8:49 AM 12/02/2021   10:53 AM 07/21/2021    8:48 AM 06/24/2021    8:16 AM 03/18/2021   10:09 AM 11/22/2020    9:10 AM 11/22/2020    9:07 AM  PHQ 2/9 Scores  PHQ - 2 Score 1 3 2 1 $ 0 0 0  PHQ- 9 Score 7 7 2        $ Fall Risk    01/29/2022    8:49 AM 12/02/2021   10:53 AM 08/14/2021    8:48 AM 07/21/2021    8:43 AM 06/24/2021    8:16 AM  Fall Risk   Falls in the past year? 0 0 0 0 0  Number falls in past yr: 0 0 0  0  Injury with Fall? 0 0 0  0  Risk for fall due to : No Fall Risks No Fall Risks   No Fall Risks  Follow up Falls evaluation completed Falls evaluation completed   Falls evaluation completed    [place]:  Any stairs in or around the home? No  If so, are there any without handrails?  N/a Home free of loose throw rugs in walkways, pet beds, electrical cords, etc? Yes  Adequate lighting in your home to reduce risk of falls? Yes   ASSISTIVE DEVICES UTILIZED TO PREVENT FALLS:  Life alert? No  Use of a cane, walker or w/c? No  Grab bars in the bathroom? No  Shower chair or bench in shower? Yes  Elevated toilet seat or a handicapped toilet? No     Cognitive Function:    11/22/2020    9:11 AM  MMSE - Mini Mental State Exam  Not completed: Unable to complete        11/22/2020    9:11 AM 11/13/2019    9:28 AM 11/11/2018    8:16 AM 11/08/2017    8:28 AM 06/04/2016    8:58 AM  6CIT Screen  What Year? 0 points 0 points 0 points 0 points 0 points  What month? 0 points 0 points 0 points 0 points 0 points  What time? 0 points 0 points 0 points 0 points 0 points  Count back from 20 0 points 0 points 0 points 0 points 0 points  Months in reverse 4 points 0 points 4 points 0 points 0 points  Repeat phrase 10 points  2 points 2 points 0 points  Total Score 14 points  6 points 2 points 0 points     Immunizations Immunization History  Administered Date(s) Administered   Influenza Split 10/30/2010, 10/09/2011  Influenza,inj,Quad PF,6+ Mos 11/14/2012, 12/13/2013, 09/18/2014, 10/02/2015, 11/23/2016, 11/08/2017, 09/08/2018, 10/05/2019, 11/12/2020, 12/02/2021   Moderna Sars-Covid-2 Vaccination 05/23/2019, 06/23/2019, 12/24/2019   PNEUMOCOCCAL CONJUGATE-20 11/14/2020   Pneumococcal Polysaccharide-23 08/21/2010   Td 06/20/2001   Tdap 08/21/2010    TDAP status: Due, Education has been provided regarding the importance of this vaccine. Advised may receive this vaccine at local pharmacy or Health Dept. Aware to provide a copy of the vaccination record if obtained from local pharmacy or Health Dept. Verbalized acceptance and understanding.  Flu Vaccine status: Up to date   Covid-19 vaccine status: Completed vaccines    Screening Tests Health Maintenance  Topic Date Due   DTaP/Tdap/Td (3 - Td or Tdap) 08/20/2020   COVID-19 Vaccine (4 - 2023-24 season) 09/19/2021   MAMMOGRAM  08/01/2022   Medicare Annual Wellness (AWV)  03/03/2023   PAP SMEAR-Modifier  07/21/2024   INFLUENZA VACCINE  Completed   Hepatitis C Screening  Completed   HIV Screening  Completed   HPV VACCINES  Aged Out    Health Maintenance  Health Maintenance Due  Topic Date Due   DTaP/Tdap/Td (3 - Td or Tdap) 08/20/2020   COVID-19 Vaccine (4 - 2023-24 season) 09/19/2021      Mammogram status: Completed 07/31/21. Repeat every year    Lung Cancer Screening: (Low Dose CT Chest recommended if Age 17-80 years, 30 pack-year currently smoking OR have quit w/in 15years.) does not qualify.   Lung Cancer Screening Referral: no  Additional Screening:  Hepatitis C Screening: does qualify; Completed 03/09/2011  Vision Screening: Recommended annual ophthalmology exams for early detection of glaucoma and other disorders of the eye. Is the patient up to date with their annual eye exam?  No  Who is the provider or  what is the name of the office in which the patient attends annual eye exams? N/a If pt is not established with a provider, would they like to be referred to a provider to establish care? No .   Dental Screening: Recommended annual dental exams for proper oral hygiene  Community Resource Referral / Chronic Care Management: CRR required this visit?  No   CCM required this visit?  No      Plan:     I have personally reviewed and noted the following in the patient's chart:   Medical and social history Use of alcohol, tobacco or illicit drugs  Current medications and supplements including opioid prescriptions. Patient is not currently taking opioid prescriptions. Functional ability and status Nutritional status Physical activity Advanced directives List of other physicians Hospitalizations, surgeries, and ER visits in previous 12 months Vitals Screenings to include cognitive, depression, and falls Referrals and appointments  In addition, I have reviewed and discussed with patient certain preventive protocols, quality metrics, and best practice recommendations. A written personalized care plan for preventive services as well as general preventive health recommendations were provided to patient.     Quentin Angst, [place]   03/02/2022

## 2022-03-16 ENCOUNTER — Other Ambulatory Visit: Payer: Self-pay | Admitting: Family Medicine

## 2022-03-21 ENCOUNTER — Other Ambulatory Visit: Payer: Self-pay | Admitting: Family Medicine

## 2022-03-25 ENCOUNTER — Encounter: Payer: Self-pay | Admitting: Gastroenterology

## 2022-03-27 ENCOUNTER — Other Ambulatory Visit: Payer: Self-pay | Admitting: Gastroenterology

## 2022-03-27 ENCOUNTER — Ambulatory Visit (INDEPENDENT_AMBULATORY_CARE_PROVIDER_SITE_OTHER): Payer: Medicare HMO

## 2022-03-27 DIAGNOSIS — M8580 Other specified disorders of bone density and structure, unspecified site: Secondary | ICD-10-CM

## 2022-03-27 DIAGNOSIS — K3184 Gastroparesis: Secondary | ICD-10-CM

## 2022-03-27 DIAGNOSIS — H6121 Impacted cerumen, right ear: Secondary | ICD-10-CM | POA: Diagnosis not present

## 2022-03-27 DIAGNOSIS — L298 Other pruritus: Secondary | ICD-10-CM | POA: Diagnosis not present

## 2022-03-27 MED ORDER — DENOSUMAB 60 MG/ML ~~LOC~~ SOSY
60.0000 mg | PREFILLED_SYRINGE | Freq: Once | SUBCUTANEOUS | Status: AC
  Administered 2022-03-27: 60 mg via SUBCUTANEOUS

## 2022-03-27 NOTE — Progress Notes (Unsigned)
pro

## 2022-03-30 DIAGNOSIS — M791 Myalgia, unspecified site: Secondary | ICD-10-CM | POA: Diagnosis not present

## 2022-03-30 DIAGNOSIS — G518 Other disorders of facial nerve: Secondary | ICD-10-CM | POA: Diagnosis not present

## 2022-03-30 DIAGNOSIS — M542 Cervicalgia: Secondary | ICD-10-CM | POA: Diagnosis not present

## 2022-03-30 DIAGNOSIS — G43719 Chronic migraine without aura, intractable, without status migrainosus: Secondary | ICD-10-CM | POA: Diagnosis not present

## 2022-05-03 ENCOUNTER — Other Ambulatory Visit: Payer: Self-pay | Admitting: Family Medicine

## 2022-05-11 ENCOUNTER — Other Ambulatory Visit: Payer: Self-pay | Admitting: Family Medicine

## 2022-05-11 DIAGNOSIS — G518 Other disorders of facial nerve: Secondary | ICD-10-CM | POA: Diagnosis not present

## 2022-05-11 DIAGNOSIS — M791 Myalgia, unspecified site: Secondary | ICD-10-CM | POA: Diagnosis not present

## 2022-05-11 DIAGNOSIS — L298 Other pruritus: Secondary | ICD-10-CM | POA: Diagnosis not present

## 2022-05-11 DIAGNOSIS — H73891 Other specified disorders of tympanic membrane, right ear: Secondary | ICD-10-CM | POA: Diagnosis not present

## 2022-05-11 DIAGNOSIS — H938X1 Other specified disorders of right ear: Secondary | ICD-10-CM | POA: Diagnosis not present

## 2022-05-11 DIAGNOSIS — M542 Cervicalgia: Secondary | ICD-10-CM | POA: Diagnosis not present

## 2022-05-11 DIAGNOSIS — G43719 Chronic migraine without aura, intractable, without status migrainosus: Secondary | ICD-10-CM | POA: Diagnosis not present

## 2022-05-11 DIAGNOSIS — Z0110 Encounter for examination of ears and hearing without abnormal findings: Secondary | ICD-10-CM | POA: Diagnosis not present

## 2022-05-20 ENCOUNTER — Other Ambulatory Visit: Payer: Self-pay | Admitting: Gastroenterology

## 2022-05-20 DIAGNOSIS — K219 Gastro-esophageal reflux disease without esophagitis: Secondary | ICD-10-CM

## 2022-06-02 ENCOUNTER — Ambulatory Visit: Payer: Medicare HMO | Admitting: Family Medicine

## 2022-06-09 ENCOUNTER — Ambulatory Visit (INDEPENDENT_AMBULATORY_CARE_PROVIDER_SITE_OTHER): Payer: Medicare HMO | Admitting: Family Medicine

## 2022-06-09 ENCOUNTER — Encounter: Payer: Self-pay | Admitting: Family Medicine

## 2022-06-09 VITALS — BP 160/100 | HR 97 | Ht 66.0 in | Wt 171.1 lb

## 2022-06-09 DIAGNOSIS — E559 Vitamin D deficiency, unspecified: Secondary | ICD-10-CM

## 2022-06-09 DIAGNOSIS — R87810 Cervical high risk human papillomavirus (HPV) DNA test positive: Secondary | ICD-10-CM

## 2022-06-09 DIAGNOSIS — I10 Essential (primary) hypertension: Secondary | ICD-10-CM

## 2022-06-09 DIAGNOSIS — Z1231 Encounter for screening mammogram for malignant neoplasm of breast: Secondary | ICD-10-CM | POA: Diagnosis not present

## 2022-06-09 DIAGNOSIS — F324 Major depressive disorder, single episode, in partial remission: Secondary | ICD-10-CM

## 2022-06-09 DIAGNOSIS — L668 Other cicatricial alopecia: Secondary | ICD-10-CM | POA: Diagnosis not present

## 2022-06-09 DIAGNOSIS — E782 Mixed hyperlipidemia: Secondary | ICD-10-CM | POA: Diagnosis not present

## 2022-06-09 DIAGNOSIS — F17218 Nicotine dependence, cigarettes, with other nicotine-induced disorders: Secondary | ICD-10-CM | POA: Diagnosis not present

## 2022-06-09 DIAGNOSIS — Z8742 Personal history of other diseases of the female genital tract: Secondary | ICD-10-CM | POA: Diagnosis not present

## 2022-06-09 MED ORDER — MINOCYCLINE HCL 50 MG PO CAPS
50.0000 mg | ORAL_CAPSULE | Freq: Two times a day (BID) | ORAL | 2 refills | Status: DC
Start: 2022-06-09 — End: 2023-04-13

## 2022-06-09 MED ORDER — OLMESARTAN MEDOXOMIL 20 MG PO TABS
20.0000 mg | ORAL_TABLET | Freq: Every day | ORAL | 2 refills | Status: DC
Start: 2022-06-09 — End: 2022-09-14

## 2022-06-09 NOTE — Progress Notes (Signed)
Tracey Daniel     MRN: [id number]      DOB: Jul 19, 1980  Chief Complaint  Patient presents with   Follow-up    Scalp irritation     HPI Tracey Daniel is here for follow up and re-evaluation of chronic medical conditions, medication management and review of any available recent lab and radiology data.  Preventive health is updated, specifically  Cancer screening and Immunization.   Needs to re esatablish/ with Dermatiology waiting for an additional 4 months for an appt The PT denies any adverse reactions to current medications since the last visit.  There are no new concerns.  There are no specific complaints   ROS Denies recent fever or chills. Denies sinus pressure, nasal congestion, ear pain or sore throat. Denies chest congestion, productive cough or wheezing. Denies chest pains, palpitations and leg swelling Denies abdominal pain, nausea, vomiting,diarrhea or constipation.   Denies dysuria, frequency, hesitancy or incontinence. Denies joint pain, swelling and limitation in mobility. Denies headaches, seizures, numbness, or tingling. Chronic depression, anxiety or insomnia. C/o scalp irritation, itching PE  BP (!) 160/100   Pulse 97   Ht 5\' 6"  (1.676 m)   Wt 171 lb 1.9 oz (77.6 kg)   LMP  (LMP Unknown) Comment: pt reports premature menopause  SpO2 99%   BMI 27.62 kg/m   Patient alert and oriented and in no cardiopulmonary distress.  HEENT: No facial asymmetry, EOMI,     Neck supple .  Chest: Clear to auscultation bilaterally.  CVS: S1, S2 no murmurs, no S3.Regular rate.  ABD: Soft non tender.   Ext: No edema  MS: Adequate ROM spine, shoulders, hips and knees.  Skin: sebnorreic dermmatitis  Psych: Good eye contact, flat affect. Memory intact not anxious or depressed appearing.  CNS: CN 2-12 intact, power,  normal throughout.no focal deficits noted.   Assessment & Plan  Essential hypertension Uncontrolled, add olmesartan and  re eval in 8  weeks DASH diet and commitment to daily physical activity for a minimum of 30 minutes discussed and encouraged, as a part of hypertension management. The importance of attaining a healthy weight is also discussed.     06/09/2022   10:46 AM 06/09/2022   10:45 AM 06/09/2022   10:38 AM 06/09/2022   10:36 AM 01/29/2022    9:10 AM 01/29/2022    9:09 AM 01/29/2022    8:47 AM  BP/Weight  Systolic BP 160 160 151 162 120 124 139  Diastolic BP 100 100 87 101 80 80 84  Wt. (Lbs)    171.12   180  BMI    27.62 kg/m2   29.05 kg/m2       Depression Controlled, no change in medication   Hyperlipemia Hyperlipidemia:Low fat diet discussed and encouraged.   Lipid Panel  Lab Results  Component Value Date   CHOL 117 06/09/2022   HDL 37 (L) 06/09/2022   LDLCALC 52 06/09/2022   TRIG 167 (H) 06/09/2022   CHOLHDL 3.2 06/09/2022     Needs to reduce fried and fatty foods  Nicotine dependence, cigarettes, with other nicotine-induced disorders Asked:confirms currently smokes cigarettes Assess: Unwilling to set a quit date, but is cutting back Advise: needs to QUIT to reduce risk of cancer, cardio and cerebrovascular disease Assist: counseled for 5 minutes and literature provided Arrange: follow up in 2 to 4 months   History of abnormal cervical Pap smear Refer for pap which will soon be due  Vitamin D deficiency Updated lab needs weekly  vit D and this is prescribed

## 2022-06-09 NOTE — Patient Instructions (Addendum)
Annual exam in 5 to 6 weeks and re evaluate blood pressure, call if you need me before  New additional medication to be started for blood pressure, olmesartan 20 mg one daily.  Continue minoxidil 2.5 mg as before.  Please schedule mammogram at [facility] at checkout,   Currently smoking 5 cigarettes a day work on cutting back to half most recently minutes per day over the next several weeks.  Once you decide to quit You need to apply the patches and stop smoking altogether.  Please cut back on beer currently reported drinking 4 per night cut back to 3 per night with the hope that you will eventually get down to no more than 1 per night.  You are referred for your pelvic and Pap which is due in July.( Dr Despina Hidden)  Labs today lipid CMP and EGFR TSH vitamin D and CBC.  Thanks for choosing [facility], we consider it a privelige to serve you.

## 2022-06-10 ENCOUNTER — Other Ambulatory Visit: Payer: Self-pay | Admitting: Family Medicine

## 2022-06-10 LAB — CBC
Hematocrit: 37 % (ref 34.0–46.6)
Hemoglobin: 12.1 g/dL (ref 11.1–15.9)
MCH: 30.9 pg (ref 26.6–33.0)
MCHC: 32.7 g/dL (ref 31.5–35.7)
MCV: 94 fL (ref 79–97)
Platelets: 226 10*3/uL (ref 150–450)
RBC: 3.92 x10E6/uL (ref 3.77–5.28)
RDW: 12.9 % (ref 11.7–15.4)
WBC: 8.5 10*3/uL (ref 3.4–10.8)

## 2022-06-10 LAB — LIPID PANEL
Chol/HDL Ratio: 3.2 ratio (ref 0.0–4.4)
Cholesterol, Total: 117 mg/dL (ref 100–199)
HDL: 37 mg/dL — ABNORMAL LOW (ref >39)
LDL Chol Calc (NIH): 52 mg/dL (ref 0–99)
Triglycerides: 167 mg/dL — ABNORMAL HIGH (ref 0–149)
VLDL Cholesterol Cal: 28 mg/dL (ref 5–40)

## 2022-06-10 LAB — CMP14+EGFR
ALT: 19 IU/L (ref 0–32)
AST: 23 IU/L (ref 0–40)
Albumin/Globulin Ratio: 2.2 (ref 1.2–2.2)
Albumin: 4.8 g/dL (ref 3.9–4.9)
Alkaline Phosphatase: 68 IU/L (ref 44–121)
BUN/Creatinine Ratio: 15 (ref 9–23)
BUN: 13 mg/dL (ref 6–24)
Bilirubin Total: <0.2 mg/dL (ref 0.0–1.2)
CO2: 19 mmol/L — ABNORMAL LOW (ref 20–29)
Calcium: 9.3 mg/dL (ref 8.7–10.2)
Chloride: 102 mmol/L (ref 96–106)
Creatinine, Ser: 0.87 mg/dL (ref 0.57–1.00)
Globulin, Total: 2.2 g/dL (ref 1.5–4.5)
Glucose: 85 mg/dL (ref 70–99)
Potassium: 4.7 mmol/L (ref 3.5–5.2)
Sodium: 135 mmol/L (ref 134–144)
Total Protein: 7 g/dL (ref 6.0–8.5)
eGFR: 84 mL/min/{1.73_m2} (ref >59)

## 2022-06-10 LAB — VITAMIN D 25 HYDROXY (VIT D DEFICIENCY, FRACTURES): Vit D, 25-Hydroxy: 23.9 ng/mL — ABNORMAL LOW (ref 30.0–100.0)

## 2022-06-10 LAB — TSH: TSH: 1.83 u[IU]/mL (ref 0.450–4.500)

## 2022-06-10 MED ORDER — VITAMIN D (ERGOCALCIFEROL) 1.25 MG (50000 UNIT) PO CAPS: ORAL_CAPSULE | ORAL | 6 refills | Status: AC

## 2022-06-13 ENCOUNTER — Encounter: Payer: Self-pay | Admitting: Family Medicine

## 2022-06-13 NOTE — Assessment & Plan Note (Addendum)
Updated lab needs weekly vit D and this is prescribed

## 2022-06-13 NOTE — Assessment & Plan Note (Signed)
Controlled, no change in medication  

## 2022-06-13 NOTE — Assessment & Plan Note (Signed)
Asked:confirms currently smokes cigarettes °Assess: Unwilling to set a quit date, but is cutting back °Advise: needs to QUIT to reduce risk of cancer, cardio and cerebrovascular disease °Assist: counseled for 5 minutes and literature provided °Arrange: follow up in 2 to 4 months ° °

## 2022-06-13 NOTE — Assessment & Plan Note (Signed)
Uncontrolled, add olmesartan and  re eval in 8 weeks DASH diet and commitment to daily physical activity for a minimum of 30 minutes discussed and encouraged, as a part of hypertension management. The importance of attaining a healthy weight is also discussed.     06/09/2022   10:46 AM 06/09/2022   10:45 AM 06/09/2022   10:38 AM 06/09/2022   10:36 AM 01/29/2022    9:10 AM 01/29/2022    9:09 AM 01/29/2022    8:47 AM  BP/Weight  Systolic BP 160 160 151 162 120 124 139  Diastolic BP 100 100 87 101 80 80 84  Wt. (Lbs)    171.12   180  BMI    27.62 kg/m2   29.05 kg/m2

## 2022-06-13 NOTE — Assessment & Plan Note (Signed)
Hyperlipidemia:Low fat diet discussed and encouraged.   Lipid Panel  Lab Results  Component Value Date   CHOL 117 06/09/2022   HDL 37 (L) 06/09/2022   LDLCALC 52 06/09/2022   TRIG 167 (H) 06/09/2022   CHOLHDL 3.2 06/09/2022     Needs to reduce fried and fatty foods

## 2022-06-13 NOTE — Assessment & Plan Note (Signed)
Refer for pap which will soon be due

## 2022-06-22 DIAGNOSIS — M542 Cervicalgia: Secondary | ICD-10-CM | POA: Diagnosis not present

## 2022-06-22 DIAGNOSIS — M791 Myalgia, unspecified site: Secondary | ICD-10-CM | POA: Diagnosis not present

## 2022-06-22 DIAGNOSIS — G43719 Chronic migraine without aura, intractable, without status migrainosus: Secondary | ICD-10-CM | POA: Diagnosis not present

## 2022-06-22 DIAGNOSIS — G518 Other disorders of facial nerve: Secondary | ICD-10-CM | POA: Diagnosis not present

## 2022-06-29 ENCOUNTER — Other Ambulatory Visit: Payer: Self-pay | Admitting: Family Medicine

## 2022-07-12 ENCOUNTER — Emergency Department (HOSPITAL_COMMUNITY)
Admission: EM | Admit: 2022-07-12 | Discharge: 2022-07-12 | Disposition: A | Discharge status: Home / Self Care | Payer: Medicare HMO | Attending: Emergency Medicine | Admitting: Emergency Medicine

## 2022-07-12 ENCOUNTER — Encounter (HOSPITAL_COMMUNITY): Payer: Self-pay | Admitting: Emergency Medicine

## 2022-07-12 ENCOUNTER — Other Ambulatory Visit: Payer: Self-pay

## 2022-07-12 DIAGNOSIS — H6121 Impacted cerumen, right ear: Secondary | ICD-10-CM | POA: Diagnosis not present

## 2022-07-12 DIAGNOSIS — Z91040 Latex allergy status: Secondary | ICD-10-CM | POA: Diagnosis not present

## 2022-07-12 MED ORDER — CARBAMIDE PEROXIDE 6.5 % OT SOLN: 5.0000 [drp] | Freq: Two times a day (BID) | OTIC | 0 refills | Status: AC

## 2022-07-12 MED ORDER — DOCUSATE SODIUM 100 MG PO CAPS
100.0000 mg | ORAL_CAPSULE | Freq: Once | ORAL | Status: AC
  Administered 2022-07-12: 100 mg via ORAL
  Filled 2022-07-12: qty 1

## 2022-07-12 NOTE — ED Provider Notes (Signed)
[place] EMERGENCY DEPARTMENT AT [facility] Provider Note   CSN: [id number] Arrival date & time: 07/12/22  1106     History  Chief Complaint  Patient presents with   Cerumen Impaction    Tracey Daniel is a 42 y.o. female presents to the ED complaining of impacted earwax in her right ear for the past few days.  Patient has attempted to use a "squeeze bulb" at home to relieve the impaction, but has not had success.  Patient states she has had 2 go to ENT or ER before for cerumen impaction.  She does have some associated pain with it.  Denies ear drainage, fever.      Home Medications Prior to Admission medications   Medication Sig Start Date End Date Taking? Authorizing Provider  acetaminophen (TYLENOL) 325 MG tablet Take 2 tablets (650 mg total) by mouth every 6 (six) hours as needed for mild pain (or Fever >/= 101). 03/11/18   Emokpae, Courage, MD  albuterol (VENTOLIN HFA) 108 (90 Base) MCG/ACT inhaler Inhale 2 puffs into the lungs every 6 (six) hours as needed for wheezing or shortness of breath. 06/24/21   Kerri Perches, MD  azelastine (ASTELIN) 0.1 % nasal spray USE 2 SPRAYS IN EACH NOSTRIL TWICE DAILY AS DIRECTED 01/16/20   Kerri Perches, MD  budesonide-formoterol [facility]) 80-4.5 MCG/ACT inhaler INHALE 2 PUFFS INTO THE LUNGS TWICE DAILY 06/29/22   Kerri Perches, MD  buPROPion [facility] SR) 150 MG 12 hr tablet TAKE 1 TABLET(150 MG) BY MOUTH TWICE DAILY 02/16/22   Kerri Perches, MD  calcium-vitamin D (OSCAL WITH D) 500-5 MG-MCG tablet Take 1 tablet by mouth 2 (two) times daily. 08/14/21   Kerri Perches, MD  carbamide peroxide (DEBROX) 6.5 % OTIC solution Place 5 drops into the right ear 2 (two) times daily. 07/12/22  Yes Fany Cavanaugh R, PA-C  clotrimazole-betamethasone (LOTRISONE) cream Apply sparingly to rash on face once daily for 1 week 08/08/19   Kerri Perches, MD  denosumab (PROLIA) 60 MG/ML SOSY injection Inject 60 mg into the  skin every 6 (six) months. 08/14/21   Kerri Perches, MD  diclofenac (VOLTAREN) 75 MG EC tablet TAKE 1 TABLET ONE TIME DAILY FOR CHRONIC BACK PAIN (DOSE REDUCTION) 05/04/22   Kerri Perches, MD  estradiol (ESTRACE) 2 MG tablet TAKE 1 TABLET(2 MG) BY MOUTH DAILY 11/12/21   Adline Potter, NP  feeding supplement (ENSURE CLINICAL STRENGTH) LIQD Take 237 mLs by mouth 3 (three) times daily with meals. 01/01/11   Dhungel, Theda Belfast, MD  fenofibrate (TRICOR) 48 MG tablet TAKE 1 TABLET(48 MG) BY MOUTH DAILY 03/16/22   Kerri Perches, MD  fluticasone [facility]) 50 MCG/ACT nasal spray Place 2 sprays into both nostrils daily. 06/24/21   Kerri Perches, MD  gabapentin (NEURONTIN) 100 MG capsule TAKE 1 CAPSULE(100 MG) BY MOUTH THREE TIMES DAILY 01/20/22   Kerri Perches, MD  linaclotide [facility]) 145 MCG CAPS capsule Take 1 capsule (145 mcg total) by mouth daily before breakfast. 05/02/19   Anice Paganini, NP  megestrol (MEGACE) 40 MG tablet TAKE 3 TABLETS BY MOUTH DAILY FOR 5 DAYS, 2 TABLETS DAILY FOR 5 DAYS, THEN 1 TABLET DAILY 11/19/21   Lazaro Arms, MD  metoCLOPramide (REGLAN) 5 MG tablet TAKE 1/2 TABLET BY MOUTH ONCE DAILY BEFORE A MEAL. MAY TAKE A SECOND DOSE IF NEEDED 04/01/22   Letta Median, PA-C  minocycline (MINOCIN) 50 MG capsule Take 1 capsule (  50 mg total) by mouth 2 (two) times daily. 06/09/22   Kerri Perches, MD  minoxidil (LONITEN) 2.5 MG tablet TAKE 1 TABLET(2.5 MG) BY MOUTH DAILY 02/27/22   Kerri Perches, MD  nicotine (NICODERM CQ - DOSED IN MG/24 HR) 7 mg/24hr patch Place 1 patch (7 mg total) onto the skin daily. 02/21/22   Kerri Perches, MD  olmesartan (BENICAR) 20 MG tablet Take 1 tablet (20 mg total) by mouth daily. 06/09/22   Kerri Perches, MD  ondansetron (ZOFRAN-ODT) 4 MG disintegrating tablet DISSOLVE 1 TABLET(4 MG) ON THE TONGUE EVERY 8 HOURS AS NEEDED FOR NAUSEA OR VOMITING 04/01/22   Letta Median, PA-C  pantoprazole (PROTONIX) 40 MG tablet  TAKE 1 TABLET(40 MG) BY MOUTH TWICE DAILY BEFORE A MEAL 12/17/21   Gelene Mink, NP  potassium chloride SA (KLOR-CON M) 20 MEQ tablet TAKE 1 TABLET BY MOUTH EVERY DAY 08/11/21   Kerri Perches, MD  prazosin (MINIPRESS) 1 MG capsule  02/21/20   [provider]  predniSONE (DELTASONE) 10 MG tablet Take 1 tablet (10 mg total) by mouth 2 (two) times daily with a meal. 01/29/22   Kerri Perches, MD  progesterone (PROMETRIUM) 200 MG capsule TAKE ONE CAPSULE BY MOUTH DAILY AT BEDTIME; DAYS 1 THROUGH 21 OF EACH MONTH. 10/19/21   Lazaro Arms, MD  propranolol (INDERAL) 10 MG tablet Take 1 tablet (10 mg total) by mouth 3 (three) times daily. 03/11/18   Shon Hale, MD  risperiDONE (RISPERDAL) 0.5 MG tablet Take 0.5 mg at bedtime by mouth.    [provider]  rosuvastatin (CRESTOR) 20 MG tablet TAKE 1 TABLET(20 MG) BY MOUTH DAILY 06/29/22   Kerri Perches, MD  sertraline (ZOLOFT) 50 MG tablet Take 50 mg by mouth at bedtime.     [provider]  sucralfate (CARAFATE) 1 g tablet TAKE 1 TABLET BY MOUTH  UP TO TWICE DAILY AS NEEDED FOR BURNING IN STOMACH 05/20/22   Letta Median, PA-C  tiZANidine (ZANAFLEX) 4 MG tablet TAKE 1 TABLET(4 MG) BY MOUTH TWICE DAILY AS NEEDED FOR BACK SPASMS 05/11/22   Kerri Perches, MD  Vitamin D, Ergocalciferol, (DRISDOL) 1.25 MG (50000 UNIT) CAPS capsule TAKE 1 CAPSULE BY MOUTH 1 TIME A WEEK 06/10/22   Kerri Perches, MD  zonisamide (ZONEGRAN) 50 MG capsule Take 150 mg by mouth at bedtime.  02/21/17   [provider]      Allergies    Benadryl [diphenhydramine], Penicillins, and Latex    Review of Systems   Review of Systems  Constitutional:  Negative for fever.  HENT:  Positive for ear pain. Negative for ear discharge.     Physical Exam Updated Vital Signs BP (!) 143/90 (BP Location: Right Arm)   Pulse 92   Temp 98 F (36.7 C) (Oral)   Resp 17   Ht 5\' 6"  (1.676 m)   Wt 77.6 kg   LMP  (LMP Unknown) Comment:  pt reports premature menopause  SpO2 100%   BMI 27.60 kg/m  Physical Exam Vitals and nursing note reviewed.  Constitutional:      General: She is not in acute distress.    Appearance: Normal appearance. She is not ill-appearing or diaphoretic.  HENT:     Right Ear: Ear canal normal. No drainage, swelling or tenderness. There is impacted cerumen.     Left Ear: Tympanic membrane and ear canal normal. There is no impacted cerumen. Tympanic membrane is not  erythematous or bulging.     Ears:     Comments: Unable to visualize right TM due to impacted cerumen.  Cardiovascular:     Rate and Rhythm: Normal rate and regular rhythm.  Pulmonary:     Effort: Pulmonary effort is normal.  Neurological:     Mental Status: She is alert. Mental status is at baseline.  Psychiatric:        Mood and Affect: Mood normal.        Behavior: Behavior normal.     ED Results / Procedures / Treatments   Labs (all labs ordered are listed, but only abnormal results are displayed) Labs Reviewed - No data to display  EKG None  Radiology No results found.  Procedures Procedures    Medications Ordered in ED Medications  docusate sodium (COLACE) capsule 100 mg (100 mg Oral Given 07/12/22 1159)    ED Course/ Medical Decision Making/ A&P                             Medical Decision Making Risk OTC drugs.   This patient presents to the ED with chief complaint(s) of impacted cerumen in the right ear and otalgia with pertinent past medical history of impacted cerumen.  The complaint involves an extensive differential diagnosis and also carries with it a high risk of complications and morbidity.    The differential diagnosis includes impacted cerumen, otitis externa, otitis media   Initial Assessment:   Exam significant for yellow impacted cerumen in right EAC.  No erythema or drainage.  Left TM and EAC unremarkable.   Treatment and Reassessment: Will have Colace put into ear to soften wax and  have ear flushed and reassessed.  If unable to remove wax successfully, will refer to ENT for removal.    Only a very small amount was able to be removed.  Disposition:   Will send patient home on drops to help soften wax.  Patient to follow-up with ENT for impacted cerumen.  Patient verbalized her agreement with plan of discharge.    The patient has been appropriately medically screened and/or stabilized in the ED. I have low suspicion for any other emergent medical condition which would require further screening, evaluation or treatment in the ED or require inpatient management. At time of discharge the patient is hemodynamically stable and in no acute distress. I have discussed work-up results and diagnosis with patient and answered all questions. Patient is agreeable with discharge plan. We discussed strict return precautions for returning to the emergency department and they verbalized understanding.            Final Clinical Impression(s) / ED Diagnoses Final diagnoses:  Impacted cerumen of right ear    Rx / DC Orders ED Discharge Orders          Ordered    carbamide peroxide (DEBROX) 6.5 % OTIC solution  2 times daily        07/12/22 1253              Lenard Simmer, [place] 07/12/22 1255    Wynetta Fines, MD 07/12/22 1605

## 2022-07-12 NOTE — Discharge Instructions (Addendum)
Thank you for allowing me to be a part of your care today.  You were evaluated in the ED for impacted ear wax.   I am sending you home on ear drops to help soften the wax.  You may also use a few drops of mineral oil in the ear.  You may continue to try gentle ear flushing to help try to clear the wax.  If you experience discomfort with this, discontinue immediately.   Please schedule a follow-up appointment with your ENT doctor.

## 2022-07-12 NOTE — ED Triage Notes (Signed)
Patient arrives ambulatory by POV c/o right ear wax build up x 3 days. Attempted to use OTC remedy w/o relief.

## 2022-07-15 DIAGNOSIS — B369 Superficial mycosis, unspecified: Secondary | ICD-10-CM | POA: Diagnosis not present

## 2022-07-15 DIAGNOSIS — L299 Pruritus, unspecified: Secondary | ICD-10-CM | POA: Diagnosis not present

## 2022-07-15 DIAGNOSIS — H6241 Otitis externa in other diseases classified elsewhere, right ear: Secondary | ICD-10-CM | POA: Diagnosis not present

## 2022-07-16 ENCOUNTER — Other Ambulatory Visit: Payer: Self-pay | Admitting: Family Medicine

## 2022-07-17 ENCOUNTER — Telehealth: Payer: Self-pay

## 2022-07-17 NOTE — Telephone Encounter (Signed)
Transition Care Management Follow-up Telephone Call Date of discharge and from where: Tracey Daniel 6/23 How have you been since you were released from the hospital? [facility]  Any questions or concerns? No  Items Reviewed: Did the pt receive and understand the discharge instructions provided? Yes  Medications obtained and verified? Yes  Other? No  Any new allergies since your discharge? No  Dietary orders reviewed? No Do you have support at home? Yes   Follow up appointments reviewed:  PCP Hospital f/u appt confirmed? Yes  Scheduled to see  on  @ . Specialist Hospital f/u appt confirmed? No  Scheduled to see  on  @ . Are transportation arrangements needed? No  If their condition worsens, is the pt aware to call PCP or go to the Emergency Dept.? Yes Was the patient provided with contact information for the PCP's office or ED? Yes Was to pt encouraged to call back with questions or concerns? Yes

## 2022-07-24 ENCOUNTER — Ambulatory Visit: Payer: Medicare HMO | Admitting: Obstetrics & Gynecology

## 2022-08-03 ENCOUNTER — Ambulatory Visit
Admission: RE | Admit: 2022-08-03 | Discharge: 2022-08-03 | Disposition: A | Discharge status: Home / Self Care | Payer: Medicare HMO | Source: Ambulatory Visit | Attending: Family Medicine | Admitting: Family Medicine

## 2022-08-03 DIAGNOSIS — M542 Cervicalgia: Secondary | ICD-10-CM | POA: Diagnosis not present

## 2022-08-03 DIAGNOSIS — G518 Other disorders of facial nerve: Secondary | ICD-10-CM | POA: Diagnosis not present

## 2022-08-03 DIAGNOSIS — Z1231 Encounter for screening mammogram for malignant neoplasm of breast: Secondary | ICD-10-CM | POA: Diagnosis not present

## 2022-08-03 DIAGNOSIS — G43719 Chronic migraine without aura, intractable, without status migrainosus: Secondary | ICD-10-CM | POA: Diagnosis not present

## 2022-08-03 DIAGNOSIS — M791 Myalgia, unspecified site: Secondary | ICD-10-CM | POA: Diagnosis not present

## 2022-08-07 DIAGNOSIS — F333 Major depressive disorder, recurrent, severe with psychotic symptoms: Secondary | ICD-10-CM | POA: Diagnosis not present

## 2022-08-12 ENCOUNTER — Ambulatory Visit: Payer: Medicare HMO | Admitting: Adult Health

## 2022-08-13 ENCOUNTER — Ambulatory Visit: Payer: Medicare HMO | Admitting: Family Medicine

## 2022-08-13 ENCOUNTER — Encounter: Payer: Self-pay | Admitting: Family Medicine

## 2022-08-13 VITALS — BP 120/80 | HR 84 | Ht 66.0 in | Wt 176.0 lb

## 2022-08-13 DIAGNOSIS — M8580 Other specified disorders of bone density and structure, unspecified site: Secondary | ICD-10-CM

## 2022-08-13 DIAGNOSIS — F411 Generalized anxiety disorder: Secondary | ICD-10-CM | POA: Diagnosis not present

## 2022-08-13 DIAGNOSIS — Z0001 Encounter for general adult medical examination with abnormal findings: Secondary | ICD-10-CM

## 2022-08-13 DIAGNOSIS — Z0000 Encounter for general adult medical examination without abnormal findings: Secondary | ICD-10-CM

## 2022-08-13 MED ORDER — DENOSUMAB 60 MG/ML ~~LOC~~ SOSY
60.0000 mg | PREFILLED_SYRINGE | Freq: Once | SUBCUTANEOUS | Status: AC
Start: 2022-08-13 — End: 2022-08-13
  Administered 2022-08-13: 60 mg via SUBCUTANEOUS

## 2022-08-13 NOTE — Patient Instructions (Addendum)
F/u in early December, call if you need me sooner  Blood pressure is good  Need Tdap at pharmacy   Nurse pls ask pharmacy to pill [pack meds each month if  possible, high pill load Prolia in office today  Please call [phone] and ask if able to get free nicoderm patches, you need the lowest dose is 7  I will refer you to our in house therapist I f not possible since you have Psychiatrist I we will let you know  Thanks for choosing [facility], we consider it a privelige to serve you.

## 2022-08-13 NOTE — Assessment & Plan Note (Signed)

## 2022-08-13 NOTE — Progress Notes (Incomplete)
    Mele Sylvester Nicolaou     MRN: [id number]      DOB: May 15, 1980  Chief Complaint  Patient presents with  . Annual Exam    CPE /bp eval    HPI: Patient is in for annual physical exam. C/o uncontrolled anxiety and requests therapy  PE: BP 120/80   Pulse 84   Ht 5\' 6"  (1.676 m)   Wt 176 lb 0.6 oz (79.9 kg)   LMP  (LMP Unknown) Comment: pt reports premature menopause  SpO2 98%   BMI 28.41 kg/m   Pleasant  female, alert and oriented x 3, in no cardio-pulmonary distress. Afebrile. HEENT No facial trauma or asymetry. Sinuses non tender.  Extra occullar muscles intact.. External ears normal, . Neck: supple, no adenopathy,JVD or thyromegaly.No bruits.  Chest: Clear to ascultation bilaterally.No crackles or wheezes. Non tender to palpation  Cardiovascular system; Heart sounds normal,  S1 and  S2 ,no S3.  No murmur, or thrill. Peripheral pulses normal.  Abdomen: Soft, non tender,   Musculoskeletal exam: Full ROM of spine, hips , shoulders and knees. No deformity ,swelling or crepitus noted. No muscle wasting or atrophy.   Neurologic: Cranial nerves 2 to 12 intact. Power, tone ,sensation and reflexes normal throughout. No disturbance in gait. No tremor.  Skin: Intact, no ulceration, erythema , scaling or rash noted. Pigmentation normal throughout  Psych; Normal mood and affect. Judgement and concentration normal   Assessment & Plan:  No problem-specific Assessment & Plan notes found for this encounter.

## 2022-08-13 NOTE — Progress Notes (Addendum)
    Tracey Daniel     MRN: [id number]      DOB: [birth year]  Chief Complaint  Patient presents with   Annual Exam    CPE /bp eval    HPI: Patient is in for annual physical exam. C/o uncontrolled anxiety and requests therapy Patient and/or legal guardian verbally consented to [facility] services about presenting concerns and psychiatric consultation as appropriate.  The services will be billed as appropriate for the patient   PE: BP 120/80   Pulse 84   Ht 5\' 6"  (1.676 m)   Wt 176 lb 0.6 oz (79.9 kg)   LMP  (LMP Unknown) Comment: pt reports premature menopause  SpO2 98%   BMI 28.41 kg/m   Pleasant  female, alert and oriented x 3, in no cardio-pulmonary distress. Afebrile. HEENT No facial trauma or asymetry. Sinuses non tender.  Extra occullar muscles intact.. External ears normal, . Neck: supple, no adenopathy,JVD or thyromegaly.No bruits.  Chest: Clear to ascultation bilaterally.No crackles or wheezes. Non tender to palpation  Cardiovascular system; Heart sounds normal,  S1 and  S2 ,no S3.  No murmur, or thrill. Peripheral pulses normal.  Abdomen: Soft, non tender,   Musculoskeletal exam: Full ROM of spine, hips , shoulders and knees. No deformity ,swelling or crepitus noted. No muscle wasting or atrophy.   Neurologic: Cranial nerves 2 to 12 intact. Power, tone ,sensation and reflexes normal throughout. No disturbance in gait. No tremor.  Skin: Intact, no ulceration, erythema , scaling or rash noted. Pigmentation normal throughout  Psych; Normal mood and affect. Judgement and concentration normal   Assessment & Plan:  Encounter for annual physical exam Annual exam as documented. Counseling done  re healthy lifestyle involving commitment to 150 minutes exercise per week, heart healthy diet, and attaining healthy weight.The importance of adequate sleep also discussed. Regular seat belt use and home safety, is also  discussed. Changes in health habits are decided on by the patient with goals and time frames  set for achieving them. Immunization and cancer screening needs are specifically addressed at this visit.   GAD (generalized anxiety disorder) Uncontrolled despite medical management by Psychiatry refer to therapist , she desires this  Osteopenia Prolia administered in office, pt brought in medication

## 2022-08-14 DIAGNOSIS — F411 Generalized anxiety disorder: Secondary | ICD-10-CM | POA: Insufficient documentation

## 2022-08-14 NOTE — Assessment & Plan Note (Signed)
Prolia administered in office, pt brought in medication

## 2022-08-14 NOTE — Assessment & Plan Note (Signed)
Uncontrolled despite medical management by Psychiatry refer to therapist , she desires this

## 2022-09-01 NOTE — Progress Notes (Signed)
Prolia injection was patient supplied. It was marked incorrectly when documented.

## 2022-09-08 ENCOUNTER — Encounter: Payer: Self-pay | Admitting: Pharmacist

## 2022-09-08 NOTE — Progress Notes (Signed)
Pharmacy Quality Measure Review  This patient is appearing on a report for being at risk of failing the adherence measure for cholesterol (statin) medications this calendar year.   Medication: rosuvastatin 20 mg daily Last fill date: 08/07/22 for 30 day supply  Insurance report was not up to date. No action needed at this time.    Adam Phenix, PharmD PGY-1 Pharmacy Resident

## 2022-09-14 ENCOUNTER — Other Ambulatory Visit: Payer: Self-pay | Admitting: Family Medicine

## 2022-09-14 DIAGNOSIS — M542 Cervicalgia: Secondary | ICD-10-CM | POA: Diagnosis not present

## 2022-09-14 DIAGNOSIS — G43719 Chronic migraine without aura, intractable, without status migrainosus: Secondary | ICD-10-CM | POA: Diagnosis not present

## 2022-09-14 DIAGNOSIS — I10 Essential (primary) hypertension: Secondary | ICD-10-CM

## 2022-09-14 DIAGNOSIS — G518 Other disorders of facial nerve: Secondary | ICD-10-CM | POA: Diagnosis not present

## 2022-09-14 DIAGNOSIS — M791 Myalgia, unspecified site: Secondary | ICD-10-CM | POA: Diagnosis not present

## 2022-09-16 DIAGNOSIS — B369 Superficial mycosis, unspecified: Secondary | ICD-10-CM | POA: Diagnosis not present

## 2022-09-16 DIAGNOSIS — H938X1 Other specified disorders of right ear: Secondary | ICD-10-CM | POA: Diagnosis not present

## 2022-09-16 DIAGNOSIS — H6241 Otitis externa in other diseases classified elsewhere, right ear: Secondary | ICD-10-CM | POA: Diagnosis not present

## 2022-09-16 DIAGNOSIS — L299 Pruritus, unspecified: Secondary | ICD-10-CM | POA: Diagnosis not present

## 2022-09-16 DIAGNOSIS — H6192 Disorder of left external ear, unspecified: Secondary | ICD-10-CM | POA: Diagnosis not present

## 2022-09-19 ENCOUNTER — Emergency Department (HOSPITAL_COMMUNITY): Payer: Medicare HMO

## 2022-09-19 ENCOUNTER — Emergency Department (HOSPITAL_COMMUNITY)
Admission: EM | Admit: 2022-09-19 | Discharge: 2022-09-20 | Disposition: A | Discharge status: Home / Self Care | Payer: Medicare HMO | Attending: Emergency Medicine | Admitting: Emergency Medicine

## 2022-09-19 ENCOUNTER — Other Ambulatory Visit: Payer: Self-pay

## 2022-09-19 DIAGNOSIS — S43005A Unspecified dislocation of left shoulder joint, initial encounter: Secondary | ICD-10-CM | POA: Diagnosis not present

## 2022-09-19 DIAGNOSIS — S4992XA Unspecified injury of left shoulder and upper arm, initial encounter: Secondary | ICD-10-CM | POA: Diagnosis present

## 2022-09-19 DIAGNOSIS — W010XXA Fall on same level from slipping, tripping and stumbling without subsequent striking against object, initial encounter: Secondary | ICD-10-CM | POA: Diagnosis not present

## 2022-09-19 DIAGNOSIS — S42252A Displaced fracture of greater tuberosity of left humerus, initial encounter for closed fracture: Secondary | ICD-10-CM

## 2022-09-19 DIAGNOSIS — R600 Localized edema: Secondary | ICD-10-CM | POA: Diagnosis not present

## 2022-09-19 DIAGNOSIS — S2232XA Fracture of one rib, left side, initial encounter for closed fracture: Secondary | ICD-10-CM | POA: Diagnosis not present

## 2022-09-19 DIAGNOSIS — R0781 Pleurodynia: Secondary | ICD-10-CM | POA: Diagnosis not present

## 2022-09-19 DIAGNOSIS — S43015A Anterior dislocation of left humerus, initial encounter: Secondary | ICD-10-CM | POA: Diagnosis not present

## 2022-09-19 DIAGNOSIS — S42292A Other displaced fracture of upper end of left humerus, initial encounter for closed fracture: Secondary | ICD-10-CM | POA: Diagnosis not present

## 2022-09-19 DIAGNOSIS — S42212A Unspecified displaced fracture of surgical neck of left humerus, initial encounter for closed fracture: Secondary | ICD-10-CM | POA: Diagnosis not present

## 2022-09-19 DIAGNOSIS — S42352A Displaced comminuted fracture of shaft of humerus, left arm, initial encounter for closed fracture: Secondary | ICD-10-CM | POA: Diagnosis not present

## 2022-09-19 LAB — BASIC METABOLIC PANEL
Anion gap: 18 — ABNORMAL HIGH (ref 5–15)
BUN: 13 mg/dL (ref 6–20)
CO2: 16 mmol/L — ABNORMAL LOW (ref 22–32)
Calcium: 9.3 mg/dL (ref 8.9–10.3)
Chloride: 98 mmol/L (ref 98–111)
Creatinine, Ser: 0.82 mg/dL (ref 0.44–1.00)
GFR, Estimated: >60 mL/min (ref >60)
Glucose, Bld: 94 mg/dL (ref 70–99)
Potassium: 3.4 mmol/L — ABNORMAL LOW (ref 3.5–5.1)
Sodium: 132 mmol/L — ABNORMAL LOW (ref 135–145)

## 2022-09-19 LAB — CBC WITH DIFFERENTIAL/PLATELET
Abs Immature Granulocytes: 0.08 10*3/uL — ABNORMAL HIGH (ref 0.00–0.07)
Basophils Absolute: 0.1 10*3/uL (ref 0.0–0.1)
Basophils Relative: 0 %
Eosinophils Absolute: 0 10*3/uL (ref 0.0–0.5)
Eosinophils Relative: 0 %
HCT: 36.3 % (ref 36.0–46.0)
Hemoglobin: 11.7 g/dL — ABNORMAL LOW (ref 12.0–15.0)
Immature Granulocytes: 0 %
Lymphocytes Relative: 9 %
Lymphs Abs: 1.8 10*3/uL (ref 0.7–4.0)
MCH: 31 pg (ref 26.0–34.0)
MCHC: 32.2 g/dL (ref 30.0–36.0)
MCV: 96 fL (ref 80.0–100.0)
Monocytes Absolute: 1.2 10*3/uL — ABNORMAL HIGH (ref 0.1–1.0)
Monocytes Relative: 6 %
Neutro Abs: 17.1 10*3/uL — ABNORMAL HIGH (ref 1.7–7.7)
Neutrophils Relative %: 85 %
Platelets: 238 10*3/uL (ref 150–400)
RBC: 3.78 MIL/uL — ABNORMAL LOW (ref 3.87–5.11)
RDW: 14.3 % (ref 11.5–15.5)
WBC: 20.3 10*3/uL — ABNORMAL HIGH (ref 4.0–10.5)
nRBC: 0 % (ref 0.0–0.2)

## 2022-09-19 MED ORDER — ONDANSETRON HCL 4 MG/2ML IJ SOLN
4.0000 mg | Freq: Once | INTRAMUSCULAR | Status: AC
  Administered 2022-09-19: 4 mg via INTRAVENOUS
  Filled 2022-09-19: qty 2

## 2022-09-19 MED ORDER — MORPHINE SULFATE (PF) 4 MG/ML IV SOLN
4.0000 mg | Freq: Once | INTRAVENOUS | Status: AC
  Administered 2022-09-19: 4 mg via INTRAVENOUS
  Filled 2022-09-19: qty 1

## 2022-09-19 MED ORDER — SODIUM CHLORIDE 0.9 % IV BOLUS
1000.0000 mL | Freq: Once | INTRAVENOUS | Status: AC
  Administered 2022-09-19: 1000 mL via INTRAVENOUS

## 2022-09-19 MED ORDER — HYDROMORPHONE HCL 1 MG/ML IJ SOLN
1.0000 mg | Freq: Once | INTRAMUSCULAR | Status: AC
  Administered 2022-09-19: 1 mg via INTRAVENOUS
  Filled 2022-09-19: qty 1

## 2022-09-19 MED ORDER — FENTANYL CITRATE PF 50 MCG/ML IJ SOSY
100.0000 ug | PREFILLED_SYRINGE | Freq: Once | INTRAMUSCULAR | Status: AC
  Administered 2022-09-20: 100 ug via INTRAVENOUS
  Filled 2022-09-19: qty 2

## 2022-09-19 NOTE — ED Provider Notes (Signed)
[place] [facility] AT [facility] Provider Note   CSN: [id number] Arrival date & time: 09/19/22  1845     History  Chief Complaint  Patient presents with   Tracey Daniel is a 42 y.o. female.  HPI   42 year old female who is left-hand dominant presents emergency department for mechanical fall with mainly left shoulder pain.  Patient states that she tripped over a chair and fell down onto her left elbow and upper arm.  She had immediate left arm pain and deformity.  Patient denies any numbness of the left hand.  There was no head injury, loss of consciousness or syncope.  Otherwise complaining of mild left elbow pain but no other acute injury.  Home Medications Prior to Admission medications   Medication Sig Start Date End Date Taking? Authorizing Provider  acetaminophen (TYLENOL) 325 MG tablet Take 2 tablets (650 mg total) by mouth every 6 (six) hours as needed for mild pain (or Fever >/= 101). 03/11/18   Emokpae, Courage, MD  albuterol (VENTOLIN HFA) 108 (90 Base) MCG/ACT inhaler INHALE 2 PUFFS INTO THE LUNGS EVERY 6 HOURS AS NEEDED FOR WHEEZING OR SHORTNESS OF BREATH 07/16/22   Kerri Perches, MD  azelastine (ASTELIN) 0.1 % nasal spray USE 2 SPRAYS IN EACH NOSTRIL TWICE DAILY AS DIRECTED 01/16/20   Kerri Perches, MD  budesonide-formoterol (SYMBICORT) 80-4.5 MCG/ACT inhaler INHALE 2 PUFFS INTO THE LUNGS TWICE DAILY 06/29/22   Kerri Perches, MD  buPROPion (WELLBUTRIN SR) 150 MG 12 hr tablet TAKE 1 TABLET(150 MG) BY MOUTH TWICE DAILY 02/16/22   Kerri Perches, MD  calcium-vitamin D (OSCAL WITH D) 500-5 MG-MCG tablet Take 1 tablet by mouth 2 (two) times daily. 08/14/21   Kerri Perches, MD  carbamide peroxide (DEBROX) 6.5 % OTIC solution Place 5 drops into the right ear 2 (two) times daily. 07/12/22   Melton Alar R, PA-C  clotrimazole-betamethasone (LOTRISONE) cream Apply sparingly to rash on face once daily for 1 week 08/08/19    Kerri Perches, MD  denosumab (PROLIA) 60 MG/ML SOSY injection Inject 60 mg into the skin every 6 (six) months. 08/14/21   Kerri Perches, MD  diclofenac (VOLTAREN) 75 MG EC tablet TAKE 1 TABLET ONE TIME DAILY FOR CHRONIC BACK PAIN (DOSE REDUCTION) 05/04/22   Kerri Perches, MD  estradiol (ESTRACE) 2 MG tablet TAKE 1 TABLET(2 MG) BY MOUTH DAILY 11/12/21   Adline Potter, NP  feeding supplement (ENSURE CLINICAL STRENGTH) LIQD Take 237 mLs by mouth 3 (three) times daily with meals. 01/01/11   Dhungel, Theda Belfast, MD  fenofibrate (TRICOR) 48 MG tablet TAKE 1 TABLET(48 MG) BY MOUTH DAILY 09/14/22   Kerri Perches, MD  fluticasone [facility]) 50 MCG/ACT nasal spray Place 2 sprays into both nostrils daily. 06/24/21   Kerri Perches, MD  gabapentin (NEURONTIN) 100 MG capsule TAKE 1 CAPSULE(100 MG) BY MOUTH THREE TIMES DAILY 01/20/22   Kerri Perches, MD  linaclotide [facility]) 145 MCG CAPS capsule Take 1 capsule (145 mcg total) by mouth daily before breakfast. 05/02/19   Anice Paganini, NP  megestrol (MEGACE) 40 MG tablet TAKE 3 TABLETS BY MOUTH DAILY FOR 5 DAYS, 2 TABLETS DAILY FOR 5 DAYS, THEN 1 TABLET DAILY 11/19/21   Lazaro Arms, MD  metoCLOPramide (REGLAN) 5 MG tablet TAKE 1/2 TABLET BY MOUTH ONCE DAILY BEFORE A MEAL. MAY TAKE A SECOND DOSE IF NEEDED 04/01/22   Letta Median, PA-C  minocycline (MINOCIN) 50 MG capsule Take 1 capsule (50 mg total) by mouth 2 (two) times daily. 06/09/22   Kerri Perches, MD  minoxidil (LONITEN) 2.5 MG tablet TAKE 1 TABLET(2.5 MG) BY MOUTH DAILY 02/27/22   Kerri Perches, MD  olmesartan (BENICAR) 20 MG tablet TAKE 1 TABLET(20 MG) BY MOUTH DAILY 09/14/22   Kerri Perches, MD  ondansetron (ZOFRAN-ODT) 4 MG disintegrating tablet DISSOLVE 1 TABLET(4 MG) ON THE TONGUE EVERY 8 HOURS AS NEEDED FOR NAUSEA OR VOMITING 04/01/22   Letta Median, PA-C  pantoprazole (PROTONIX) 40 MG tablet TAKE 1 TABLET(40 MG) BY MOUTH TWICE DAILY BEFORE A MEAL  12/17/21   Gelene Mink, NP  potassium chloride SA (KLOR-CON M) 20 MEQ tablet TAKE 1 TABLET BY MOUTH EVERY DAY 09/14/22   Kerri Perches, MD  prazosin (MINIPRESS) 1 MG capsule  02/21/20   [provider]  progesterone (PROMETRIUM) 200 MG capsule TAKE ONE CAPSULE BY MOUTH DAILY AT BEDTIME; DAYS 1 THROUGH 21 OF EACH MONTH. 10/19/21   Lazaro Arms, MD  propranolol (INDERAL) 10 MG tablet Take 1 tablet (10 mg total) by mouth 3 (three) times daily. 03/11/18   Shon Hale, MD  risperiDONE (RISPERDAL) 0.5 MG tablet Take 0.5 mg at bedtime by mouth.    [provider]  rosuvastatin (CRESTOR) 20 MG tablet TAKE 1 TABLET(20 MG) BY MOUTH DAILY 06/29/22   Kerri Perches, MD  sertraline (ZOLOFT) 50 MG tablet Take 50 mg by mouth at bedtime.     [provider]  sucralfate (CARAFATE) 1 g tablet TAKE 1 TABLET BY MOUTH  UP TO TWICE DAILY AS NEEDED FOR BURNING IN STOMACH 05/20/22   Letta Median, PA-C  tiZANidine (ZANAFLEX) 4 MG tablet TAKE 1 TABLET(4 MG) BY MOUTH TWICE DAILY AS NEEDED FOR BACK SPASMS 05/11/22   Kerri Perches, MD  Vitamin D, Ergocalciferol, (DRISDOL) 1.25 MG (50000 UNIT) CAPS capsule TAKE 1 CAPSULE BY MOUTH 1 TIME A WEEK 06/10/22   Kerri Perches, MD  zonisamide (ZONEGRAN) 50 MG capsule Take 150 mg by mouth at bedtime.  02/21/17   [provider]      Allergies    Benadryl [diphenhydramine], Penicillins, and Latex    Review of Systems   Review of Systems  Respiratory:  Negative for shortness of breath.   Cardiovascular:  Negative for chest pain.  Musculoskeletal:  Negative for back pain and neck pain.       Left shoulder and elbow pain  Neurological:  Negative for syncope and headaches.    Physical Exam Updated Vital Signs BP 136/86 (BP Location: Right Arm)   Pulse 85   Temp 98.1 F (36.7 C) (Oral)   Resp 18   Ht 5\' 6"  (1.676 m)   Wt 79.9 kg   LMP  (LMP Unknown) Comment: pt reports premature menopause  SpO2 100%   BMI 28.43  kg/m  Physical Exam Vitals and nursing note reviewed.  Constitutional:      General: She is in acute distress.     Appearance: Normal appearance.  HENT:     Head: Normocephalic.     Mouth/Throat:     Mouth: Mucous membranes are moist.  Cardiovascular:     Rate and Rhythm: Normal rate.  Pulmonary:     Effort: Pulmonary effort is normal. No respiratory distress.  Abdominal:     Palpations: Abdomen is soft.     Tenderness: There is no abdominal tenderness.  Musculoskeletal:  Cervical back: No rigidity or tenderness.     Comments: Deformity of the left shoulder with what appears to be the palpable humeral head anteriorly, very tender to palpation, mild surrounding edema.  The left elbow does not appear to be acutely swollen/deformed but there is diffuse tenderness to palpation.  Palpable radial pulse, good grip strength and sensation intact in the left hand.  Left upper extremity exam is significantly limited secondary to pain and limited range of motion from what I believe is acute fracture or dislocation of the left shoulder.  Skin:    General: Skin is warm.  Neurological:     Mental Status: She is alert and oriented to person, place, and time. Mental status is at baseline.  Psychiatric:        Mood and Affect: Mood normal.     ED Results / Procedures / Treatments   Labs (all labs ordered are listed, but only abnormal results are displayed) Labs Reviewed  CBC WITH DIFFERENTIAL/PLATELET - Abnormal; Notable for the following components:      Result Value   WBC 20.3 (*)    RBC 3.78 (*)    Hemoglobin 11.7 (*)    Neutro Abs 17.1 (*)    Monocytes Absolute 1.2 (*)    Abs Immature Granulocytes 0.08 (*)    All other components within normal limits  BASIC METABOLIC PANEL - Abnormal; Notable for the following components:   Sodium 132 (*)    Potassium 3.4 (*)    CO2 16 (*)    Anion gap 18 (*)    All other components within normal limits    EKG None  Radiology DG Shoulder  Left  Result Date: 09/19/2022 CLINICAL DATA:  Larey Seat onto left shoulder EXAM: LEFT SHOULDER - 2+ VIEW COMPARISON:  None Available. FINDINGS: Frontal and transscapular views of the left shoulder are obtained. There is anterior glenohumeral dislocation, with displaced fracture off the superolateral aspect of the humeral head consistent with Hill-Sachs deformity. Soft tissue swelling. Left chest is clear. IMPRESSION: 1. Anterior glenohumeral dislocation, with displaced fracture of the superolateral aspect of the humeral head consistent with Hill-Sachs injury. Electronically Signed   By: Sharlet Salina M.D.   On: 09/19/2022 20:45    Procedures Procedures    Medications Ordered in ED Medications  sodium chloride 0.9 % bolus 1,000 mL (1,000 mLs Intravenous New Bag/Given 09/19/22 2053)  ondansetron [facility]) injection 4 mg (4 mg Intravenous Given 09/19/22 2051)  morphine (PF) 4 MG/ML injection 4 mg (4 mg Intravenous Given 09/19/22 2052)    ED Course/ Medical Decision Making/ A&P                                 Medical Decision Making Amount and/or Complexity of Data Reviewed Labs: ordered. Radiology: ordered.  Risk Prescription drug management.   42 year old female presents emergency department with a mechanical fall down onto the left arm.  She has an obvious deformity of the left shoulder.  Otherwise no head injury, loss of consciousness or syncopal complaints.  Vitals are normal and stable on arrival.  The left upper extremity is neurovascularly intact there is obvious deformity of the left shoulder and tenderness to palpation of the left elbow.  Initial x-ray imaging appears to confirm left shoulder dislocation with what we believe is a fracture of the humeral head.  This is very hard to decipher from the x-rays alone.  Spoke with on-call orthopedic surgeon,  Dr. Yevette Edwards who has reviewed the x-ray imaging.  He recommends proceeding with CT imaging of the shoulder.  He will review the imaging  and give [place] further recommendations based off this.  Further pain medicine ordered for the patient, she is updated on the plan.  Patient signed out to Dr. Eudelia Bunch pending CT imaging.        Final Clinical Impression(s) / ED Diagnoses Final diagnoses:  None    Rx / DC Orders ED Discharge Orders     None         Rozelle Logan, DO 09/19/22 2325

## 2022-09-19 NOTE — ED Triage Notes (Signed)
The pt tripped over her computer chair   around 1700 she is now c/o her lt shoulder no other injuries

## 2022-09-20 ENCOUNTER — Emergency Department (HOSPITAL_COMMUNITY): Payer: Medicare HMO

## 2022-09-20 DIAGNOSIS — R600 Localized edema: Secondary | ICD-10-CM | POA: Diagnosis not present

## 2022-09-20 DIAGNOSIS — R0781 Pleurodynia: Secondary | ICD-10-CM | POA: Diagnosis not present

## 2022-09-20 DIAGNOSIS — S43005A Unspecified dislocation of left shoulder joint, initial encounter: Secondary | ICD-10-CM | POA: Diagnosis not present

## 2022-09-20 MED ORDER — KETAMINE HCL 50 MG/5ML IJ SOSY
50.0000 mg | PREFILLED_SYRINGE | Freq: Once | INTRAMUSCULAR | Status: AC
  Administered 2022-09-20: 50 mg via INTRAVENOUS
  Filled 2022-09-20: qty 5

## 2022-09-20 MED ORDER — KETOROLAC TROMETHAMINE 15 MG/ML IJ SOLN
15.0000 mg | Freq: Once | INTRAMUSCULAR | Status: AC
  Administered 2022-09-20: 15 mg via INTRAVENOUS
  Filled 2022-09-20: qty 1

## 2022-09-20 MED ORDER — HYDROMORPHONE HCL 1 MG/ML IJ SOLN
1.0000 mg | Freq: Once | INTRAMUSCULAR | Status: AC
  Administered 2022-09-20: 1 mg via INTRAVENOUS
  Filled 2022-09-20: qty 1

## 2022-09-20 MED ORDER — OXYCODONE HCL 5 MG PO TABS: 2.5000 mg | ORAL_TABLET | Freq: Four times a day (QID) | ORAL | 0 refills | Status: AC | PRN

## 2022-09-20 MED ORDER — PROPOFOL 10 MG/ML IV BOLUS
100.0000 mg | Freq: Once | INTRAVENOUS | Status: AC
  Administered 2022-09-20: 100 mg via INTRAVENOUS
  Filled 2022-09-20: qty 20

## 2022-09-20 NOTE — Discharge Instructions (Addendum)
For pain control you may take 1000 mg of Tylenol every 8 hours scheduled.  In addition you can take 0.5 to 1 tablet of Oxycodone every 6 hours as needed for pain not controlled with the scheduled Tylenol.  Incentive spirometer: Use frequently to prevent pneumonia.  Recommended usage: 10 deep inhalations through spirometer every 2-3 hours for 7-10 days.   

## 2022-09-20 NOTE — ED Provider Notes (Signed)
I assumed care of this patient from previous provider.  Please see their note for further details of history, exam, and MDM.   Briefly patient is a 42 y.o. female who presented after fall resulting in left shoulder fracture/dislocation.  Pending reduction and repeat imaging.  Reduced under conscious sedation. CT scan confirmed successful reduction with fracture of the greater tuberosity.  Also noted T4 rib fracture. Rib x-ray negative for any other fractures.   [facility] Shih Injury Treatment  Date/Time: 09/20/2022 7:33 AM  Performed by: Nira Conn, MD Authorized by: Nira Conn, MD   Consent:    Consent obtained:  Verbal   Consent given by:  Patient   Risks discussed:  Fracture, irreducible dislocation, recurrent dislocation, nerve damage and stiffness   Alternatives discussed:  No treatment, alternative treatment and immobilizationInjury location: shoulder Location details: left shoulder Injury type: fracture-dislocation Dislocation type: anterior Fracture type: greater humeral tuberosity Pre-procedure neurovascular assessment: neurovascularly intact  Patient sedated: Yes. Refer to sedation procedure documentation for details of sedation. Manipulation performed: yes Skeletal traction used: yes Reduction successful: yes X-ray confirmed reduction: yes Immobilization: sling Post-procedure neurovascular assessment: post-procedure neurovascularly intact   .Sedation  Date/Time: 09/20/2022 7:34 AM  Performed by: Nira Conn, MD Authorized by: Nira Conn, MD   Consent:    Consent obtained:  Verbal   Risks discussed:  Allergic reaction, prolonged hypoxia resulting in organ damage, prolonged sedation necessitating reversal, respiratory compromise necessitating ventilatory assistance and intubation, dysrhythmia, inadequate sedation, nausea and vomiting   Alternatives discussed:  Analgesia without sedation Universal protocol:    Immediately  prior to procedure, a time out was called: yes     Patient identity confirmed:  Arm band Indications:    Procedure performed:  Dislocation reduction   Procedure necessitating sedation performed by:  Physician performing sedation Pre-sedation assessment:    Time since last food or drink:  1830   ASA classification: class 2 - patient with mild systemic disease     Mallampati score:  II - soft palate, uvula, fauces visible   Pre-sedation assessments completed and reviewed: airway patency, cardiovascular function, hydration status, mental status, nausea/vomiting, pain level and respiratory function     Pre-sedation assessment completed:  09/20/2022 1:35 AM Immediate pre-procedure details:    Reassessment: Patient reassessed immediately prior to procedure     Reviewed: vital signs, relevant labs/tests and NPO status     Verified: bag valve mask available, emergency equipment available, intubation equipment available, IV patency confirmed, oxygen available and suction available   Procedure details (see MAR for exact dosages):    Preoxygenation:  Nasal cannula   Sedation:  Ketamine and propofol   Intended level of sedation: deep   Analgesia:  Hydromorphone   Intra-procedure monitoring:  Blood pressure monitoring, cardiac monitor, continuous capnometry, frequent LOC assessments and frequent vital sign checks   Intra-procedure events: none     Total Provider sedation time (minutes):  16 Post-procedure details:    Post-sedation assessment completed:  09/20/2022 3:16 AM   Attendance: Constant attendance by certified staff until patient recovered     Recovery: Patient returned to pre-procedure baseline     Post-sedation assessments completed and reviewed: airway patency, cardiovascular function, hydration status, mental status, nausea/vomiting, pain level, respiratory function and temperature     Patient is stable for discharge or admission: yes     Procedure completion:  Tolerated well, no immediate  complications  The patient appears reasonably screened and/or stabilized for discharge and I doubt any other medical  condition or other [facility] requiring further screening, evaluation, or treatment in the ED at this time. I have discussed the findings, Dx and Tx plan with the patient/family who expressed understanding and agree(s) with the plan. Discharge instructions discussed at length. The patient/family was given strict return precautions who verbalized understanding of the instructions. No further questions at time of discharge.  Disposition: Discharge  Condition: Good  ED Discharge Orders          Ordered    oxyCODONE (ROXICODONE) 5 MG immediate release tablet  Every 6 hours PRN        09/20/22 0711            The [place] narcotic database reviewed and no active prescriptions noted.   Follow Up: Kerri Perches, MD [address] [place] [address] [phone]  Call  as needed  Luci Bank, MD [address] [place] [address] [phone]  Call  to schedule an appointment for close follow up for shoulder injury      Avarie Tavano, Amadeo Garnet, MD 09/20/22 [phone]

## 2022-09-20 NOTE — ED Notes (Signed)
I explained to pt how to use IS and pt was able to return demonstrate understanding on use.

## 2022-09-25 ENCOUNTER — Other Ambulatory Visit: Payer: Self-pay | Admitting: Orthopedic Surgery

## 2022-09-25 DIAGNOSIS — S42252A Displaced fracture of greater tuberosity of left humerus, initial encounter for closed fracture: Secondary | ICD-10-CM | POA: Diagnosis not present

## 2022-09-28 ENCOUNTER — Encounter (HOSPITAL_COMMUNITY): Payer: Self-pay | Admitting: Orthopedic Surgery

## 2022-09-28 ENCOUNTER — Other Ambulatory Visit: Payer: Self-pay | Admitting: Gastroenterology

## 2022-09-28 ENCOUNTER — Other Ambulatory Visit: Payer: Self-pay | Admitting: Obstetrics & Gynecology

## 2022-09-28 DIAGNOSIS — K3184 Gastroparesis: Secondary | ICD-10-CM

## 2022-09-28 NOTE — Progress Notes (Signed)
SDW call  Patient was given pre-op instructions over the phone. Patient verbalized understanding of instructions provided.     PCP - Dr. Syliva Overman Cardiologist -  Pulmonary:    PPM/ICD - denies Device Orders - n/a Rep Notified - n/a   Chest x-ray - n/a EKG -  DOS, 09/29/2022 Stress Test - ECHO -  Cardiac Cath -   Sleep Study/sleep apnea/CPAP: denies  Non-diabetic   Blood Thinner Instructions: denies Aspirin Instructions:denies   ERAS Protcol - Yes, clear fluids until 0930   COVID TEST- n/a    Anesthesia review: No   Patient denies shortness of breath, fever, cough and chest pain over the phone call  Your procedure is scheduled on Tuesday September 29, 2022  Report to [facility] Main Entrance "A" at  1000  A.M., then check in with the Admitting office.  Call this number if you have problems the morning of surgery:  [phone]   If you have any questions prior to your surgery date call [phone]: Open Monday-Friday 8am-4pm If you experience any cold or flu symptoms such as cough, fever, chills, shortness of breath, etc. between now and your scheduled surgery, please notify us at the above number     Remember:  Do not eat after midnight the night before your surgery  You may drink clear liquids until 0930  the morning of your surgery.   Clear liquids allowed are: Water, Non-Citrus Juices (without pulp), Carbonated Beverages, Clear Tea, Black Coffee ONLY (NO MILK, CREAM OR POWDERED CREAMER of any kind), and Gatorade   Take these medicines the morning of surgery with A SIP OF WATER:  Astelin, symbicort, wellbutrin, propranolol, crestor, estradiol, fenofibrate, flonase, gabapentin, minoxidil, protonix  As needed: Tylenol, albuterol, reglan, zofran, carafate, zanaflex  As of today, STOP taking any Aspirin (unless otherwise instructed by your surgeon) Aleve, Naproxen, Ibuprofen, Motrin, Advil, Goody's, BC's, all herbal medications, fish oil, and all vitamins.   This includes your Voltaren.

## 2022-09-29 ENCOUNTER — Encounter (HOSPITAL_COMMUNITY)
Admission: RE | Disposition: A | Discharge status: Home / Self Care | Payer: Self-pay | Source: Home / Self Care | Attending: Orthopedic Surgery

## 2022-09-29 ENCOUNTER — Ambulatory Visit (HOSPITAL_COMMUNITY): Payer: Medicare HMO

## 2022-09-29 ENCOUNTER — Encounter (HOSPITAL_COMMUNITY): Payer: Self-pay | Admitting: Orthopedic Surgery

## 2022-09-29 ENCOUNTER — Ambulatory Visit (HOSPITAL_COMMUNITY)
Admission: RE | Admit: 2022-09-29 | Discharge: 2022-09-29 | Disposition: A | Discharge status: Home / Self Care | Payer: Medicare HMO | Attending: Orthopedic Surgery | Admitting: Orthopedic Surgery

## 2022-09-29 ENCOUNTER — Other Ambulatory Visit: Payer: Self-pay

## 2022-09-29 DIAGNOSIS — S42302A Unspecified fracture of shaft of humerus, left arm, initial encounter for closed fracture: Secondary | ICD-10-CM | POA: Diagnosis not present

## 2022-09-29 DIAGNOSIS — J4489 Other specified chronic obstructive pulmonary disease: Secondary | ICD-10-CM | POA: Diagnosis not present

## 2022-09-29 DIAGNOSIS — S42252A Displaced fracture of greater tuberosity of left humerus, initial encounter for closed fracture: Secondary | ICD-10-CM | POA: Diagnosis not present

## 2022-09-29 DIAGNOSIS — F17210 Nicotine dependence, cigarettes, uncomplicated: Secondary | ICD-10-CM | POA: Insufficient documentation

## 2022-09-29 DIAGNOSIS — E782 Mixed hyperlipidemia: Secondary | ICD-10-CM

## 2022-09-29 DIAGNOSIS — G8918 Other acute postprocedural pain: Secondary | ICD-10-CM | POA: Diagnosis not present

## 2022-09-29 DIAGNOSIS — F17200 Nicotine dependence, unspecified, uncomplicated: Secondary | ICD-10-CM | POA: Diagnosis not present

## 2022-09-29 DIAGNOSIS — F418 Other specified anxiety disorders: Secondary | ICD-10-CM | POA: Insufficient documentation

## 2022-09-29 DIAGNOSIS — J449 Chronic obstructive pulmonary disease, unspecified: Secondary | ICD-10-CM | POA: Diagnosis not present

## 2022-09-29 DIAGNOSIS — W010XXA Fall on same level from slipping, tripping and stumbling without subsequent striking against object, initial encounter: Secondary | ICD-10-CM | POA: Insufficient documentation

## 2022-09-29 DIAGNOSIS — Z8711 Personal history of peptic ulcer disease: Secondary | ICD-10-CM | POA: Insufficient documentation

## 2022-09-29 DIAGNOSIS — K219 Gastro-esophageal reflux disease without esophagitis: Secondary | ICD-10-CM | POA: Insufficient documentation

## 2022-09-29 DIAGNOSIS — I10 Essential (primary) hypertension: Secondary | ICD-10-CM

## 2022-09-29 DIAGNOSIS — S42202A Unspecified fracture of upper end of left humerus, initial encounter for closed fracture: Secondary | ICD-10-CM

## 2022-09-29 HISTORY — DX: Essential (primary) hypertension: I10

## 2022-09-29 HISTORY — PX: ORIF HUMERUS FRACTURE: SHX2126

## 2022-09-29 LAB — CBC
HCT: 37.9 % (ref 36.0–46.0)
Hemoglobin: 12 g/dL (ref 12.0–15.0)
MCH: 30.6 pg (ref 26.0–34.0)
MCHC: 31.7 g/dL (ref 30.0–36.0)
MCV: 96.7 fL (ref 80.0–100.0)
Platelets: 294 10*3/uL (ref 150–400)
RBC: 3.92 MIL/uL (ref 3.87–5.11)
RDW: 13.3 % (ref 11.5–15.5)
WBC: 7.3 10*3/uL (ref 4.0–10.5)
nRBC: 0 % (ref 0.0–0.2)

## 2022-09-29 SURGERY — OPEN REDUCTION INTERNAL FIXATION (ORIF) PROXIMAL HUMERUS FRACTURE
Anesthesia: General | Laterality: Left

## 2022-09-29 MED ORDER — SUGAMMADEX SODIUM 200 MG/2ML IV SOLN
INTRAVENOUS | Status: DC | PRN
  Administered 2022-09-29: 200 mg via INTRAVENOUS

## 2022-09-29 MED ORDER — VANCOMYCIN HCL IN DEXTROSE 1-5 GM/200ML-% IV SOLN
INTRAVENOUS | Status: AC
  Administered 2022-09-29: 1000 mg via INTRAVENOUS
  Filled 2022-09-29: qty 200

## 2022-09-29 MED ORDER — LIDOCAINE 2% (20 MG/ML) 5 ML SYRINGE
INTRAMUSCULAR | Status: DC | PRN
  Administered 2022-09-29: 40 mg via INTRAVENOUS

## 2022-09-29 MED ORDER — DEXMEDETOMIDINE HCL IN NACL 80 MCG/20ML IV SOLN
INTRAVENOUS | Status: DC | PRN
Start: 2022-09-29 — End: 2022-09-29
  Administered 2022-09-29 (×2): 10 ug via INTRAVENOUS

## 2022-09-29 MED ORDER — PROPOFOL 10 MG/ML IV BOLUS
INTRAVENOUS | Status: DC | PRN
  Administered 2022-09-29: 200 mg via INTRAVENOUS

## 2022-09-29 MED ORDER — FENTANYL CITRATE (PF) 250 MCG/5ML IJ SOLN
INTRAMUSCULAR | Status: AC
  Filled 2022-09-29: qty 5

## 2022-09-29 MED ORDER — ACETAMINOPHEN 500 MG PO TABS
1000.0000 mg | ORAL_TABLET | Freq: Once | ORAL | Status: AC
  Administered 2022-09-29: 1000 mg via ORAL
  Filled 2022-09-29: qty 2

## 2022-09-29 MED ORDER — PROPOFOL 10 MG/ML IV BOLUS
INTRAVENOUS | Status: AC
  Filled 2022-09-29: qty 20

## 2022-09-29 MED ORDER — MIDAZOLAM HCL 2 MG/2ML IJ SOLN
INTRAMUSCULAR | Status: AC
  Filled 2022-09-29: qty 2

## 2022-09-29 MED ORDER — MIDAZOLAM HCL 2 MG/2ML IJ SOLN
INTRAMUSCULAR | Status: AC
  Administered 2022-09-29: 1 mg via INTRAVENOUS
  Filled 2022-09-29: qty 2

## 2022-09-29 MED ORDER — DROPERIDOL 2.5 MG/ML IJ SOLN: 0.6250 mg | Freq: Once | INTRAMUSCULAR | Status: DC | PRN

## 2022-09-29 MED ORDER — ACETAMINOPHEN 160 MG/5ML PO SOLN: 325.0000 mg | ORAL | Status: DC | PRN

## 2022-09-29 MED ORDER — VANCOMYCIN HCL 1000 MG IV SOLR
INTRAVENOUS | Status: DC | PRN
Start: 2022-09-29 — End: 2022-09-29
  Administered 2022-09-29: 1000 mg

## 2022-09-29 MED ORDER — ROCURONIUM BROMIDE 10 MG/ML (PF) SYRINGE
PREFILLED_SYRINGE | INTRAVENOUS | Status: DC | PRN
  Administered 2022-09-29 (×2): 20 mg via INTRAVENOUS
  Administered 2022-09-29: 40 mg via INTRAVENOUS
  Administered 2022-09-29: 20 mg via INTRAVENOUS

## 2022-09-29 MED ORDER — DEXAMETHASONE SODIUM PHOSPHATE 10 MG/ML IJ SOLN
INTRAMUSCULAR | Status: DC | PRN
  Administered 2022-09-29: 10 mg via INTRAVENOUS

## 2022-09-29 MED ORDER — DEXAMETHASONE SODIUM PHOSPHATE 10 MG/ML IJ SOLN
INTRAMUSCULAR | Status: AC
  Filled 2022-09-29: qty 1

## 2022-09-29 MED ORDER — FENTANYL CITRATE (PF) 100 MCG/2ML IJ SOLN
INTRAMUSCULAR | Status: AC
  Administered 2022-09-29: 50 ug via INTRAVENOUS
  Filled 2022-09-29: qty 2

## 2022-09-29 MED ORDER — ALBUTEROL SULFATE (2.5 MG/3ML) 0.083% IN NEBU
INHALATION_SOLUTION | RESPIRATORY_TRACT | Status: AC
  Administered 2022-09-29: 2.5 mg via RESPIRATORY_TRACT
  Filled 2022-09-29: qty 3

## 2022-09-29 MED ORDER — ORAL CARE MOUTH RINSE: 15.0000 mL | Freq: Once | OROMUCOSAL | Status: AC

## 2022-09-29 MED ORDER — FENTANYL CITRATE (PF) 250 MCG/5ML IJ SOLN
INTRAMUSCULAR | Status: DC | PRN
  Administered 2022-09-29 (×3): 50 ug via INTRAVENOUS

## 2022-09-29 MED ORDER — ALBUTEROL SULFATE (2.5 MG/3ML) 0.083% IN NEBU: 2.5000 mg | INHALATION_SOLUTION | Freq: Once | RESPIRATORY_TRACT | Status: AC

## 2022-09-29 MED ORDER — OXYCODONE HCL 5 MG PO TABS: 5.0000 mg | ORAL_TABLET | Freq: Once | ORAL | Status: DC | PRN

## 2022-09-29 MED ORDER — MIDAZOLAM HCL 2 MG/2ML IJ SOLN: 1.0000 mg | Freq: Once | INTRAMUSCULAR | Status: AC

## 2022-09-29 MED ORDER — ONDANSETRON HCL 4 MG/2ML IJ SOLN
INTRAMUSCULAR | Status: DC | PRN
  Administered 2022-09-29: 4 mg via INTRAVENOUS

## 2022-09-29 MED ORDER — VANCOMYCIN HCL 1000 MG IV SOLR
INTRAVENOUS | Status: AC
  Filled 2022-09-29: qty 20

## 2022-09-29 MED ORDER — PROMETHAZINE HCL 25 MG/ML IJ SOLN: 6.2500 mg | INTRAMUSCULAR | Status: DC | PRN

## 2022-09-29 MED ORDER — VANCOMYCIN HCL IN DEXTROSE 1-5 GM/200ML-% IV SOLN: 1000.0000 mg | Freq: Once | INTRAVENOUS | Status: AC

## 2022-09-29 MED ORDER — OXYCODONE HCL 5 MG/5ML PO SOLN: 5.0000 mg | Freq: Once | ORAL | Status: DC | PRN

## 2022-09-29 MED ORDER — ROCURONIUM BROMIDE 10 MG/ML (PF) SYRINGE
PREFILLED_SYRINGE | INTRAVENOUS | Status: AC
  Filled 2022-09-29: qty 10

## 2022-09-29 MED ORDER — LIDOCAINE 2% (20 MG/ML) 5 ML SYRINGE
INTRAMUSCULAR | Status: AC
  Filled 2022-09-29: qty 5

## 2022-09-29 MED ORDER — ACETAMINOPHEN 10 MG/ML IV SOLN: 1000.0000 mg | Freq: Once | INTRAVENOUS | Status: DC | PRN

## 2022-09-29 MED ORDER — BUPIVACAINE-EPINEPHRINE (PF) 0.5% -1:200000 IJ SOLN
INTRAMUSCULAR | Status: DC | PRN
Start: 2022-09-29 — End: 2022-09-29
  Administered 2022-09-29: 12 mL via PERINEURAL

## 2022-09-29 MED ORDER — FENTANYL CITRATE (PF) 100 MCG/2ML IJ SOLN: 50.0000 ug | Freq: Once | INTRAMUSCULAR | Status: AC

## 2022-09-29 MED ORDER — CHLORHEXIDINE GLUCONATE 0.12 % MT SOLN
OROMUCOSAL | Status: AC
  Administered 2022-09-29: 15 mL via OROMUCOSAL
  Filled 2022-09-29: qty 15

## 2022-09-29 MED ORDER — LACTATED RINGERS IV SOLN: INTRAVENOUS | Status: DC

## 2022-09-29 MED ORDER — 0.9 % SODIUM CHLORIDE (POUR BTL) OPTIME
TOPICAL | Status: DC | PRN
  Administered 2022-09-29: 1000 mL

## 2022-09-29 MED ORDER — PHENYLEPHRINE HCL-NACL 20-0.9 MG/250ML-% IV SOLN
INTRAVENOUS | Status: DC | PRN
  Administered 2022-09-29: 30 ug/min via INTRAVENOUS

## 2022-09-29 MED ORDER — MIDAZOLAM HCL 2 MG/2ML IJ SOLN
INTRAMUSCULAR | Status: DC | PRN
  Administered 2022-09-29: 2 mg via INTRAVENOUS

## 2022-09-29 MED ORDER — FENTANYL CITRATE (PF) 100 MCG/2ML IJ SOLN: 25.0000 ug | INTRAMUSCULAR | Status: DC | PRN

## 2022-09-29 MED ORDER — CHLORHEXIDINE GLUCONATE 0.12 % MT SOLN: 15.0000 mL | Freq: Once | OROMUCOSAL | Status: AC

## 2022-09-29 MED ORDER — ACETAMINOPHEN 325 MG PO TABS: 325.0000 mg | ORAL_TABLET | ORAL | Status: DC | PRN

## 2022-09-29 MED ORDER — ONDANSETRON HCL 4 MG/2ML IJ SOLN
INTRAMUSCULAR | Status: AC
  Filled 2022-09-29: qty 2

## 2022-09-29 SURGICAL SUPPLY — 72 items
ANCH SUT CRKSW FT 1.3X (Anchor) ×1 IMPLANT
ANCH SUT SWLK 19.1X4.75 (Anchor) ×2 IMPLANT
ANCHOR SUT BIO SW 4.75X19.1 (Anchor) IMPLANT
ANCHOR SUT BIOCOMP CORKSREW (Anchor) IMPLANT
ANCHOR SWIVELOCK SP KL 4.75 (Anchor) IMPLANT
APL SKNCLS STERI-STRIP NONHPOA (GAUZE/BANDAGES/DRESSINGS) ×1
BAG COUNTER SPONGE SURGICOUNT (BAG) ×2 IMPLANT
BAG SPNG CNTER NS LX DISP (BAG) ×1
BENZOIN TINCTURE PRP APPL 2/3 (GAUZE/BANDAGES/DRESSINGS) ×2 IMPLANT
BIT DRILL 3.5 CANN STRL (BIT) IMPLANT
BLADE SURG 10 STRL SS (BLADE) ×2 IMPLANT
BLADE SURG 15 STRL LF DISP TIS (BLADE) ×2 IMPLANT
BLADE SURG 15 STRL SS (BLADE) ×1
CLEANER TIP ELECTROSURG 2X2 (MISCELLANEOUS) IMPLANT
CLSR STERI-STRIP ANTIMIC 1/2X4 (GAUZE/BANDAGES/DRESSINGS) ×2 IMPLANT
COVER SURGICAL LIGHT HANDLE (MISCELLANEOUS) ×4 IMPLANT
DRAPE C-ARM 42X72 X-RAY (DRAPES) ×2 IMPLANT
DRAPE IMP U-DRAPE 54X76 (DRAPES) ×2 IMPLANT
DRAPE INCISE IOBAN 66X45 STRL (DRAPES) ×2 IMPLANT
DRAPE SURG 17X11 SM STRL (DRAPES) ×2 IMPLANT
DRAPE SURG 17X23 STRL (DRAPES) ×2 IMPLANT
DRAPE U-SHAPE 47X51 STRL (DRAPES) ×4 IMPLANT
DRSG AQUACEL AG ADV 3.5X 6 (GAUZE/BANDAGES/DRESSINGS) IMPLANT
DURAPREP 26ML APPLICATOR (WOUND CARE) ×4 IMPLANT
ELECT CAUTERY BLADE 6.4 (BLADE) ×2 IMPLANT
ELECT REM PT RETURN 9FT ADLT (ELECTROSURGICAL)
ELECTRODE REM PT RTRN 9FT ADLT (ELECTROSURGICAL) IMPLANT
GAUZE PAD ABD 8X10 STRL (GAUZE/BANDAGES/DRESSINGS) ×6 IMPLANT
GAUZE SPONGE 4X4 12PLY STRL (GAUZE/BANDAGES/DRESSINGS) ×6 IMPLANT
GAUZE XEROFORM 1X8 LF (GAUZE/BANDAGES/DRESSINGS) ×4 IMPLANT
GLOVE BIO SURGEON STRL SZ7 (GLOVE) ×2 IMPLANT
GLOVE BIOGEL PI IND STRL 7.0 (GLOVE) ×2 IMPLANT
GLOVE BIOGEL PI IND STRL 7.5 (GLOVE) ×2 IMPLANT
GLOVE ORTHO TXT STRL SZ7.5 (GLOVE) ×8 IMPLANT
GOWN STRL REUS W/ TWL LRG LVL3 (GOWN DISPOSABLE) IMPLANT
GOWN STRL REUS W/TWL LRG LVL3 (GOWN DISPOSABLE)
GUIDEWIRE 1.35MM (WIRE) IMPLANT
KIT BASIN OR (CUSTOM PROCEDURE TRAY) ×4 IMPLANT
KIT TURNOVER KIT B (KITS) ×4 IMPLANT
MANIFOLD NEPTUNE II (INSTRUMENTS) ×4 IMPLANT
NDL HYPO 21X1.5 SAFETY (NEEDLE) ×4 IMPLANT
NDL HYPO 25GX1X1/2 BEV (NEEDLE) ×2 IMPLANT
NEEDLE HYPO 21X1.5 SAFETY (NEEDLE) ×2
NEEDLE HYPO 25GX1X1/2 BEV (NEEDLE) ×1
NS IRRIG 1000ML POUR BTL (IV SOLUTION) ×4 IMPLANT
PACK SHOULDER (CUSTOM PROCEDURE TRAY) ×4 IMPLANT
PACK UNIVERSAL I (CUSTOM PROCEDURE TRAY) ×2 IMPLANT
PAD ARMBOARD 7.5X6 YLW CONV (MISCELLANEOUS) ×4 IMPLANT
PASSER SUT SWANSON 36MM LOOP (INSTRUMENTS) IMPLANT
PENCIL BUTTON HOLSTER BLD 10FT (ELECTRODE) IMPLANT
SCREW CANN QF 4X34 (Screw) IMPLANT
SCREW CANN THRD 4X30 (Screw) IMPLANT
SCREW QCKFIX CANN 4.0X38MM (Screw) IMPLANT
SPONGE T-LAP 4X18 ~~LOC~~+RFID (SPONGE) ×4 IMPLANT
STAPLER VISISTAT 35W (STAPLE) ×2 IMPLANT
SUCTION TUBE FRAZIER 10FR DISP (SUCTIONS) ×2 IMPLANT
SUT FIBERWIRE #2 38 T-5 BLUE (SUTURE)
SUT MNCRL AB 3-0 PS2 18 (SUTURE) IMPLANT
SUT VIC AB 0 CT1 27 (SUTURE) ×1
SUT VIC AB 0 CT1 27XBRD ANBCTR (SUTURE) ×4 IMPLANT
SUT VIC AB 2-0 CT1 36 (SUTURE) ×2 IMPLANT
SUT VIC AB 2-0 FS1 27 (SUTURE) ×4 IMPLANT
SUT VIC AB 3-0 FS2 27 (SUTURE) ×2 IMPLANT
SUTURE FIBERWR #2 38 T-5 BLUE (SUTURE) IMPLANT
SYR CONTROL 10ML LL (SYRINGE) ×4 IMPLANT
TAPE FIBER 2MM 7IN #2 BLUE (SUTURE) IMPLANT
TOWEL GREEN STERILE (TOWEL DISPOSABLE) ×2 IMPLANT
TOWEL GREEN STERILE FF (TOWEL DISPOSABLE) ×4 IMPLANT
TRAY FOLEY MTR SLVR 16FR STAT (SET/KITS/TRAYS/PACK) IMPLANT
WASHER ORTHO OD TITAN F/CA 7 (Washer) IMPLANT
WATER STERILE IRR 1000ML POUR (IV SOLUTION) ×4 IMPLANT
YANKAUER SUCT BULB TIP NO VENT (SUCTIONS) ×2 IMPLANT

## 2022-09-29 NOTE — Anesthesia Preprocedure Evaluation (Addendum)
Anesthesia Evaluation    Reviewed: Allergy & Precautions, Patient's Chart, lab work & pertinent test results  Airway Mallampati: II  TM Distance: >3 FB Neck ROM: Full    Dental  (+) Teeth Intact, Dental Advisory Given   Pulmonary asthma , COPD,  COPD inhaler, Current Smoker and Patient abstained from smoking.   breath sounds clear to auscultation       Cardiovascular hypertension, Pt. on medications  Rhythm:Regular Rate:Normal     Neuro/Psych  Headaches PSYCHIATRIC DISORDERS Anxiety Depression     Neuromuscular disease    GI/Hepatic Neg liver ROS, PUD,GERD  Controlled,,  Endo/Other  negative endocrine ROS    Renal/GU negative Renal ROS     Musculoskeletal negative musculoskeletal ROS (+)    Abdominal   Peds  Hematology  (+) Blood dyscrasia, anemia   Anesthesia Other Findings   Reproductive/Obstetrics                             Anesthesia Physical Anesthesia Plan  ASA: 3  Anesthesia Plan: General   Post-op Pain Management: Tylenol PO (pre-op)*   Induction: Intravenous  PONV Risk Score and Plan: 3 and Ondansetron, Dexamethasone and Midazolam  Airway Management Planned: Oral ETT  Additional Equipment: None  Intra-op Plan:   Post-operative Plan: Extubation in OR  Informed Consent:   Plan Discussed with: CRNA  Anesthesia Plan Comments:        Anesthesia Quick Evaluation

## 2022-09-29 NOTE — Brief Op Note (Signed)
09/29/2022  5:23 PM  PATIENT:  Tracey Daniel  42 y.o. female  PRE-OPERATIVE DIAGNOSIS:  LEFT PROXIMAL HUMERUS FRACTURE  POST-OPERATIVE DIAGNOSIS:  LEFT PROXIMAL HUMERUS FRACTURE  PROCEDURE:  Procedure(s): OPEN REDUCTION INTERNAL FIXATION (ORIF) PROXIMAL HUMERUS FRACTURE (Left)  SURGEON:  Surgeons and Role:    * Luci Bank, MD - Primary   ASSISTANTS: RFA   ANESTHESIA:   general with regional block for post op pain control  EBL:  100 mL   BLOOD ADMINISTERED:none  DRAINS: none   LOCAL MEDICATIONS USED:  NONE  SPECIMEN:  No Specimen  DISPOSITION OF SPECIMEN:  N/A  COUNTS:  YES  TOURNIQUET:  * No tourniquets in log *  DICTATION: .Note written in EPIC  PLAN OF CARE: Discharge to home after PACU  PATIENT DISPOSITION:  PACU - hemodynamically stable.   Delay start of Pharmacological VTE agent (>24hrs) due to surgical blood loss or risk of bleeding: no

## 2022-09-29 NOTE — Anesthesia Procedure Notes (Signed)
Anesthesia Regional Block: Interscalene brachial plexus block   Pre-Anesthetic Checklist: , timeout performed,  Correct Patient, Correct Site, Correct Laterality,  Correct Procedure, Correct Position, site marked,  Risks and benefits discussed,  Surgical consent,  Pre-op evaluation,  At surgeon's request and post-op pain management  Laterality: Left  Prep: chloraprep       Needles:  Injection technique: Single-shot  Needle Type: Echogenic Stimulator Needle     Needle Length: 9cm  Needle Gauge: 21     Additional Needles:   Procedures:,,,, ultrasound used (permanent image in chart),,    Narrative:  Start time: 09/29/2022 2:00 PM End time: 09/29/2022 2:10 PM Injection made incrementally with aspirations every 5 mL.  Performed by: Personally  Anesthesiologist: Shelton Silvas, MD  Additional Notes: Discussed risks and benefits of the nerve block in detail, including but not limited vascular injury, permanent nerve damage and infection.   Patient tolerated the procedure well. Local anesthetic introduced in an incremental fashion under minimal resistance after negative aspirations. No paresthesias were elicited. After completion of the procedure, no acute issues were identified and patient continued to be monitored by RN.

## 2022-09-29 NOTE — Transfer of Care (Signed)
Immediate Anesthesia Transfer of Care Note  Patient: Tracey Daniel  Procedure(s) Performed: OPEN REDUCTION INTERNAL FIXATION (ORIF) PROXIMAL HUMERUS FRACTURE (Left)  Patient Location: PACU  Anesthesia Type:General  Level of Consciousness: awake, oriented, and patient cooperative  Airway & Oxygen Therapy: Patient Spontanous Breathing  Post-op Assessment: Report given to RN, Post -op Vital signs reviewed and stable, and Patient moving all extremities  Post vital signs: Reviewed and stable  Last Vitals:  Vitals Value Taken Time  BP 124/56 09/29/22 1722  Temp    Pulse 83 09/29/22 1724  Resp 17 09/29/22 1724  SpO2 100 % 09/29/22 1724  Vitals shown include unfiled device data.  Last Pain:  Vitals:   09/29/22 1425  TempSrc:   PainSc: 0-No pain         Complications: No notable events documented.

## 2022-09-29 NOTE — Anesthesia Procedure Notes (Signed)
Procedure Name: Intubation Date/Time: 09/29/2022 2:47 PM  Performed by: Loleta Rechy Bost, CRNAPre-anesthesia Checklist: Patient identified, Patient being monitored, Timeout performed, Emergency Drugs available and Suction available Patient Re-evaluated:Patient Re-evaluated prior to induction Oxygen Delivery Method: Circle system utilized Preoxygenation: Pre-oxygenation with 100% oxygen Induction Type: IV induction Ventilation: Mask ventilation without difficulty Laryngoscope Size: Mac and 4 Grade View: Grade I Tube type: Oral Tube size: 7.0 mm Number of attempts: 1 Airway Equipment and Method: Stylet Placement Confirmation: ETT inserted through vocal cords under direct vision, positive ETCO2 and breath sounds checked- equal and bilateral Secured at: 21 cm Tube secured with: Tape Dental Injury: Teeth and Oropharynx as per pre-operative assessment

## 2022-09-29 NOTE — H&P (Signed)
Tracey Daniel is an 42 y.o. female.   Chief Complaint: Left shoulder pain HPI: Tracey Daniel is a 42 year old female left-hand dominant presenting as a new patient from the emergency department for left shoulder pain.  She sustained a ground-level fall after tripping over a computer cord on Saturday, 09/19/22.  She went to the emergency department and was diagnosed with a left proximal femur is fracture dislocation.  She underwent a closed reduction in the emergency room.  Postreduction CT scan demonstrated a successful reduction of the humeral head with a mildly displaced greater tuberosity fracture.  She was placed in a sling and has been taking Tylenol and oxycodone as needed for pain.  Past Medical History:  Diagnosis Date   Alopecia    Anemia of other chronic disease 11/29/2012   Asthma    Back pain, chronic    Bronchitis    Chronic abdominal pain    Constipation    COPD (chronic obstructive pulmonary disease) (HCC)    Cyst of skin    mid spine area   Depression 2000   h/o suicidal ideation   Gastric outlet obstruction    Gastritis    Gastroparesis    GERD (gastroesophageal reflux disease)    Hypertension    LGSIL of cervix of undetermined significance 07/08/2020   07/08/20 pt aware will get colpo, per [facility]   Migraine    Migraines    Nausea and vomiting    recurrent   Nicotine addiction    Osteoporosis    Peptic ulcer disease    Pyloric spasm 03/30/2011   Seasonal allergies    Sinusitis    Substance abuse (HCC) 2008   marijuana   Vaginal Pap smear, abnormal    Vitamin D deficiency     Past Surgical History:  Procedure Laterality Date   BALLOON DILATION N/A 02/09/2013   Procedure: BALLOON DILATION;  Surgeon: Barrie Folk, MD;  Location: [facility] END[facility]COPY;  Service: Endoscopy;  Laterality: N/A;   BALLOON DILATION N/A 03/10/2018   Procedure: BALLOON DILATION;  Surgeon: Corbin Ade, MD;  Location: [facility];  Service: Endoscopy;  Laterality: N/A;   BILIARY DILATION   06/01/2018   Procedure: PYLORIC DILATION;  Surgeon: Meridee Score Netty Starring., MD;  Location: [facility] ENDOSCOPY;  Service: Gastroenterology;;   BIOPSY  06/01/2018   Procedure: BIOPSY;  Surgeon: Lemar Lofty., MD;  Location: [facility] ENDOSCOPY;  Service: Gastroenterology;;   BOTOX INJECTION N/A 02/09/2013   Procedure: BOTOX INJECTION;  Surgeon: Barrie Folk, MD;  Location: [facility] ENDOSCOPY;  Service: Endoscopy;  Laterality: N/A;  possible balloon   BOTOX INJECTION N/A 10/24/2015   Procedure: BOTOX INJECTION;  Surgeon: Corbin Ade, MD;  Location: [facility];  Service: Endoscopy;  Laterality: N/A;   BOTOX INJECTION N/A 03/10/2018   Procedure: BOTOX INJECTION;  Surgeon: Corbin Ade, MD;  Location: [facility];  Service: Endoscopy;  Laterality: N/A;  Melanie notified   BOTOX INJECTION  06/01/2018   Procedure: BOTOX INJECTION;  Surgeon: Lemar Lofty., MD;  Location: [facility] ENDOSCOPY;  Service: Gastroenterology;;   CARPAL TUNNEL RELEASE     left hand   COLONOSCOPY WITH PROPOFOL N/A 09/20/2014   RMR: Normal ileo-colonoscopy   ESOPHAGEAL DILATION  12/25/2010   Procedure: ESOPHAGEAL DILATION;  Surgeon: [name], MD;  [facility];  Service: Endoscopy;;   ESOPHAGEAL MANOMETRY N/A 10/03/2018   Surgeon: Napoleon Form, MD;  Normal.   ESOPHAGOGASTRODUODENOSCOPY  12/25/2010   [name], MD;  moderate gastritis, ?goo secondary to pylorspasm. BX showed reactive gstropathy no h.pyori, SB mucosa with intramucosal lymphocytosis and partial villous blunting (TTG 4.0 normal)   ESOPHAGOGASTRODUODENOSCOPY N/A 11/14/2012   VHQ:IONGE hiatal hernia. Abnormal gastric mucosa of  uncertain significance-status post biopsy. Subjectively, patient may have recurrent symptomatic, pylorospasm.   ESOPHAGOGASTRODUODENOSCOPY (EGD) WITH PROPOFOL N/A 02/09/2013   elongated stomach, partial lower esophageal ring widely patent. No obvious pyloric stenosis s/p Botox   ESOPHAGOGASTRODUODENOSCOPY  (EGD) WITH PROPOFOL N/A 10/24/2015   Procedure: ESOPHAGOGASTRODUODENOSCOPY (EGD) WITH PROPOFOL;  Surgeon: Corbin Ade, MD;  [facility];  Service: Endoscopy;  Laterality: N/A;  830    ESOPHAGOGASTRODUODENOSCOPY (EGD) WITH PROPOFOL N/A 03/10/2018   Dr. Jena Gauss: normal esophagus, elongated, dilated stomach likely stigmata of slow gastric emptying due to narrowing of pyloric channel, s/p balloon dilatation of pyloric channel. Small hiatal hernia, normal duodenal bulb and second portion of duodenum   ESOPHAGOGASTRODUODENOSCOPY (EGD) WITH PROPOFOL N/A 06/01/2018   Surgeon: Lemar Lofty., MD; normal esophagus s/p biopsy, small hiatal hernia, erythematous mucosa in the cardia, gastric fundus, gastric body, erosive gastropathy s/p biopsy, normal pylorus s/p dilation and Botox injection, normal duodenum s/p biopsy.  Surgical pathology all benign.   LAPAROSCOPIC CHOLECYSTECTOMY  2002   [facility]?  [place]?   laser surgery on cervix     NASAL SEPTUM SURGERY  01/29/2017   TOE SURGERY      Family History  Problem Relation Age of Onset   Lung cancer Mother 57   Cancer Father    Diabetes Father    Liver disease Father        liver transplant at [facility], age 97   Multiple sclerosis Sister 28   Depression Sister 34   Alcohol abuse Sister    Bipolar disorder Sister    Schizophrenia Sister    Heart disease Maternal Grandmother    Parkinson's disease Maternal Grandfather    Cancer - Colon Neg Hx    Social History:  reports that she has been smoking cigarettes. She has a 3.8 pack-year smoking history. She has never used smokeless tobacco. She reports current drug use. Frequency: 2.00 times per week. Drug: Marijuana. She reports that she does not drink alcohol.  Allergies:  Allergies  Allergen Reactions   Benadryl [Diphenhydramine] Anaphylaxis   Penicillins Other (See Comments)    Unknown Has patient had a PCN reaction causing immediate rash, facial/tongue/throat swelling, SOB or  lightheadedness with hypotension Yes Has patient had a PCN reaction causing severe rash involving mucus membranes or skin necrosis: Yes-hives  Has patient had a PCN reaction that required hospitalization Yes Has patient had a PCN reaction occurring within the last 10 years: Yes If all of the above answers are "NO", then may proceed with Cephalosporin use.    Latex Itching and Other (See Comments)    "burning" where the tape goes    Medications Prior to Admission  Medication Sig Dispense Refill   acetaminophen (TYLENOL) 325 MG tablet Take 2 tablets (650 mg total) by mouth every 6 (six) hours as needed for mild pain (or Fever >/= 101). 20 tablet 0   albuterol (VENTOLIN HFA) 108 (90 Base) MCG/ACT inhaler INHALE 2 PUFFS INTO THE LUNGS EVERY 6 HOURS AS NEEDED FOR WHEEZING OR SHORTNESS OF BREATH 6.7 g 0   azelastine (ASTELIN) 0.1 % nasal spray USE 2 SPRAYS IN EACH NOSTRIL TWICE DAILY AS DIRECTED 30 mL 2   budesonide-formoterol (SYMBICORT) 80-4.5 MCG/ACT inhaler INHALE 2 PUFFS INTO THE LUNGS TWICE DAILY  10.2 g 0   buPROPion (WELLBUTRIN SR) 150 MG 12 hr tablet TAKE 1 TABLET(150 MG) BY MOUTH TWICE DAILY 60 tablet 5   calcium-vitamin D (OSCAL WITH D) 500-5 MG-MCG tablet Take 1 tablet by mouth 2 (two) times daily. 60 tablet 11   carbamide peroxide (DEBROX) 6.5 % OTIC solution Place 5 drops into the right ear 2 (two) times daily. (Patient taking differently: Place 5 drops into the right ear as needed (itching).) 15 mL 0   diclofenac (VOLTAREN) 75 MG EC tablet TAKE 1 TABLET ONE TIME DAILY FOR CHRONIC BACK PAIN (DOSE REDUCTION) 90 tablet 3   estradiol (ESTRACE) 2 MG tablet TAKE 1 TABLET(2 MG) BY MOUTH DAILY 90 tablet 3   feeding supplement (ENSURE CLINICAL STRENGTH) LIQD Take 237 mLs by mouth 3 (three) times daily with meals. 10 Bottle 3   fenofibrate (TRICOR) 48 MG tablet TAKE 1 TABLET(48 MG) BY MOUTH DAILY 30 tablet 5   fluticasone (FLONASE) 50 MCG/ACT nasal spray Place 2 sprays into both nostrils daily.  16 g 6   gabapentin (NEURONTIN) 100 MG capsule TAKE 1 CAPSULE(100 MG) BY MOUTH THREE TIMES DAILY 90 capsule 5   linaclotide (LINZESS) 145 MCG CAPS capsule Take 1 capsule (145 mcg total) by mouth daily before breakfast. 30 capsule 5   megestrol (MEGACE) 40 MG tablet TAKE 3 TABLETS BY MOUTH DAILY FOR 5 DAYS, 2 TABLETS DAILY FOR 5 DAYS, THEN 1 TABLET DAILY (Patient taking differently: Take 40 mg by mouth daily.) 30 tablet 11   metoCLOPramide (REGLAN) 5 MG tablet TAKE 1/2 TABLET BY MOUTH ONCE DAILY BEFORE A MEAL. MAY TAKE A SECOND DOSE IF NEEDED 180 tablet 0   minocycline (MINOCIN) 50 MG capsule Take 1 capsule (50 mg total) by mouth 2 (two) times daily. 60 capsule 2   minoxidil (LONITEN) 2.5 MG tablet TAKE 1 TABLET(2.5 MG) BY MOUTH DAILY 90 tablet 3   olmesartan (BENICAR) 20 MG tablet TAKE 1 TABLET(20 MG) BY MOUTH DAILY 30 tablet 2   ondansetron (ZOFRAN-ODT) 4 MG disintegrating tablet DISSOLVE 1 TABLET(4 MG) ON THE TONGUE EVERY 8 HOURS AS NEEDED FOR NAUSEA OR VOMITING 30 tablet 3   pantoprazole (PROTONIX) 40 MG tablet TAKE 1 TABLET(40 MG) BY MOUTH TWICE DAILY BEFORE A MEAL 180 tablet 3   potassium chloride SA (KLOR-CON M) 20 MEQ tablet TAKE 1 TABLET BY MOUTH EVERY DAY 90 tablet 3   prazosin (MINIPRESS) 1 MG capsule Take 1 mg by mouth at bedtime.     progesterone (PROMETRIUM) 200 MG capsule TAKE ONE CAPSULE BY MOUTH DAILY AT BEDTIME; DAYS 1 THROUGH 21 OF EACH MONTH. 21 capsule 11   propranolol (INDERAL) 10 MG tablet Take 1 tablet (10 mg total) by mouth 3 (three) times daily. 90 tablet 2   risperiDONE (RISPERDAL) 0.5 MG tablet Take 0.5 mg at bedtime by mouth.     rosuvastatin (CRESTOR) 20 MG tablet TAKE 1 TABLET(20 MG) BY MOUTH DAILY 30 tablet 5   sertraline (ZOLOFT) 50 MG tablet Take 50 mg by mouth at bedtime.      sucralfate (CARAFATE) 1 g tablet TAKE 1 TABLET BY MOUTH  UP TO TWICE DAILY AS NEEDED FOR BURNING IN STOMACH 180 tablet 1   tiZANidine (ZANAFLEX) 4 MG tablet TAKE 1 TABLET(4 MG) BY MOUTH TWICE  DAILY AS NEEDED FOR BACK SPASMS 180 tablet 1   Vitamin D, Ergocalciferol, (DRISDOL) 1.25 MG (50000 UNIT) CAPS capsule TAKE 1 CAPSULE BY MOUTH 1 TIME A WEEK 5 capsule 6   zonisamide (  ZONEGRAN) 50 MG capsule Take 150 mg by mouth at bedtime.   2   denosumab (PROLIA) 60 MG/ML SOSY injection Inject 60 mg into the skin every 6 (six) months. 1 mL 3    Results for orders placed or performed during the hospital encounter of 09/29/22 (from the past 48 hour(s))  CBC per protocol     Status: None   Collection Time: 09/29/22 10:44 AM  Result Value Ref Range   WBC 7.3 4.0 - 10.5 K/uL   RBC 3.92 3.87 - 5.11 MIL/uL   Hemoglobin 12.0 12.0 - 15.0 g/dL   HCT 40.3 47.4 - 25.9 %   MCV 96.7 80.0 - 100.0 fL   MCH 30.6 26.0 - 34.0 pg   MCHC 31.7 30.0 - 36.0 g/dL   RDW 56.3 87.5 - 64.3 %   Platelets 294 150 - 400 K/uL   nRBC 0.0 0.0 - 0.2 %    Comment: Performed at [facility], [address], [place], [place] [address]   No results found.  Review of Systems  Constitutional: Negative.   HENT: Negative.    Respiratory: Negative.    Cardiovascular: Negative.   Gastrointestinal: Negative.   Musculoskeletal:  Positive for arthralgias.    Blood pressure (!) 134/92, pulse 76, temperature 98.3 F (36.8 C), temperature source Oral, resp. rate 17, height 5\' 6"  (1.676 m), weight 80.7 kg, SpO2 100%. Physical Exam Constitutional:      Appearance: Normal appearance. She is normal weight.  HENT:     Head: Normocephalic and atraumatic.  Cardiovascular:     Rate and Rhythm: Normal rate and regular rhythm.  Pulmonary:     Effort: Pulmonary effort is normal.  Abdominal:     General: Abdomen is flat.     Palpations: Abdomen is soft.  Musculoskeletal:     Comments: Left upper extremity Left shoulder with moderate swelling and ecchymoses. TTP over left shoulder and proximal humerus, no pain over elbow/forearm/wrist/hand Range of motion of shoulder limited by pain.   Able to fire her axillary  nerve. Motor intact distally Sensation light touch intact axillary/median/radial/ulnar 2+ radial pulse, cap refill <2   Neurological:     General: No focal deficit present.     Mental Status: She is alert and oriented to person, place, and time.      Assessment/Plan 42 year old female with left proximal humerus greater tuberosity fracture  Plan for left proximal humerus open reduction internal fixation  Luci Bank, MD 09/29/2022, 11:33 AM

## 2022-09-30 ENCOUNTER — Encounter (HOSPITAL_COMMUNITY): Payer: Self-pay | Admitting: Orthopedic Surgery

## 2022-09-30 NOTE — Anesthesia Postprocedure Evaluation (Signed)
Anesthesia Post Note  Patient: Tracey Daniel  Procedure(s) Performed: OPEN REDUCTION INTERNAL FIXATION (ORIF) PROXIMAL HUMERUS FRACTURE (Left)     Patient location during evaluation: PACU Anesthesia Type: General and Regional Level of consciousness: awake Pain management: pain level controlled Vital Signs Assessment: post-procedure vital signs reviewed and stable Respiratory status: spontaneous breathing, nonlabored ventilation and respiratory function stable Cardiovascular status: blood pressure returned to baseline and stable Postop Assessment: no apparent nausea or vomiting Anesthetic complications: no   No notable events documented.  Last Vitals:  Vitals:   09/29/22 1745 09/29/22 1800  BP: 127/71 127/81  Pulse: 77 76  Resp: 17 15  Temp:  36.9 C  SpO2: 100% 100%    Last Pain:  Vitals:   09/29/22 1800  TempSrc:   PainSc: 0-No pain                 Dashana Guizar P Naamah Boggess

## 2022-09-30 NOTE — Op Note (Signed)
.Orthopaedic Surgery Operative Note (CSN: [id number])  Tracey Daniel  [birth year] Date of Surgery: 09/29/2022   Diagnoses:  LEFT PROXIMAL HUMERUS FRACTURE  Procedure: Left proximal humerus open reduction internal fixation   Operative Finding Successful completion of the planned procedure.    Post-operative plan: The patient will be NWB LUE in sling at all times.  Pain control with PRN pain medication preferring oral medicines.  Follow up plan will be scheduled in approximately 14 days for incision check and XR.  Post-Op Diagnosis: Same Surgeons:Primary: Luci Bank, MD Assistants:Caroline McBane PA-C Location: [facility] OR ROOM 10 Anesthesia: General with preoperative nerve block for post operative pain control Antibiotics: Clindamycin with local vancomycin powder 1 g at the surgical site Tourniquet time: N/A Estimated Blood Loss: 100cc Complications: None Specimens: None Implants: Implant Name Type Inv. Item Serial No. Manufacturer Lot No. LRB No. Used Action  [facility] SUT CRKSW FT 1.3X - [id number] Anchor [facility] SUT CRKSW FT 1.3X  [employer] [id number] Left 1 Implanted  ANCHOR SWIVELOCK SP KL 4.75 - [id number] Anchor ANCHOR SWIVELOCK SP KL 4.75  [employer] [id number] Left 1 Implanted  SCREW CANN QF 4X34 - [id number] Screw SCREW CANN QF 4X34  [employer]  Left 1 Implanted  WASHER ORTHO OD TITAN F/CA 7 - [id number] Washer WASHER ORTHO OD TITAN F/CA 7  [employer]  Left 1 Implanted    Indications for Surgery:   Tracey Daniel is a 42 y.o. female with medical history of hypertension,COPD is a current smoker, anxiety and osteoporosis on Prolia who sustains a left shoulder fracture dislocation. She was successfully reduced in the emergency room but had a displaced greater tuberosity fracture.Benefits and risks of operative and nonoperative management were discussed prior to surgery with patient and informed consent form was completed.  Specific risks including bleeding, infection, damage to  neurovascular structures, need for additional surgery, Failure of fixation, malunion, nonunion, painful hardware, stiffness and anesthetic complications including stroke, myocardial infarction and even death.   Procedure:   The patient was identified properly. Informed consent was obtained and the surgical site was marked. The patient was taken up to suite where general anesthesia was induced.  The patient was positioned in the beach chair position and all bony prominences were well padded.Morgan 1 x 200 The right arm was prepped and draped in the usual sterile fashion.  Timeout was performed before the beginning of the case.  Incision was made in line with the anterolateral border of the acromion with care taken not to extend beyond 5cm from the lateral edge of the acromion to avoid the axillary nerve. The deltoid muscle was split in line with its fibers. The subdeltoid bursa was bluntly dissected and excised. The greater tuberosity fracture was identified and noted to be comminuted and displaced posteriorly with the supraspinatus and infraspinatus attachments largely intact. Traction sutures were placed into the rotator cuff for fracture mobilization. The fracture site was debrided of any loose bone fragments and edges of the fracture site were cleared of periosteum.   An [employer] Corkscrew anchor was inserted  at the medial edge of the fracture site near the articular margin. The sutures were passed through the intact rotator cuff using a free needle. The fracture fragment was reduced and confirmed under fluoroscopy and provisionally held with k-wires. One 4.0 x 34 mm partially threaded screw and washer were used to compress the fragment. The fixation was reinforced with the sutures from the Corkscrew anchor placed over the fragment and into an  Ar[employer]rex 4.10mm Peek SwiveLock suture achor anterior and distal to the fracture. Final fluoroscopic images were obtained.  We irrigated the wound copiously before  placing local antibiotic as listed above.  We closed the incision in a multilayer fashion with absorbable suture.  Sterile dressing was placed and a sling and pillow applied.  Patient was awoken taken to PACU in stable condition.  Luci Bank 09/30/2022, 3:46 PM Orthopaedic Surgery

## 2022-10-05 ENCOUNTER — Ambulatory Visit: Payer: Medicare HMO | Admitting: Adult Health

## 2022-10-07 ENCOUNTER — Telehealth: Payer: Self-pay

## 2022-10-07 NOTE — Telephone Encounter (Signed)
Transition Care Management Unsuccessful Follow-up Telephone Call  Date of discharge and from where:  Tracey Daniel 9/1  Attempts:  1st Attempt  Reason for unsuccessful TCM follow-up call:  No answer/busy   Tracey Daniel  [facility], [facility] Guide, Phone: [phone] Website: [email or web address]

## 2022-10-08 ENCOUNTER — Telehealth: Payer: Self-pay

## 2022-10-08 NOTE — Telephone Encounter (Signed)
Transition Care Management Unsuccessful Follow-up Telephone Call  Date of discharge and from where:  [facility]  Attempts:  2nd Attempt  Reason for unsuccessful TCM follow-up call:  No answer/busy   Lenard Forth Bonsall  [facility], [facility] Guide, Phone: [phone] Website: [email or web address]

## 2022-10-12 DIAGNOSIS — S42252A Displaced fracture of greater tuberosity of left humerus, initial encounter for closed fracture: Secondary | ICD-10-CM | POA: Diagnosis not present

## 2022-10-14 DIAGNOSIS — M25612 Stiffness of left shoulder, not elsewhere classified: Secondary | ICD-10-CM | POA: Diagnosis not present

## 2022-10-14 DIAGNOSIS — S42292D Other displaced fracture of upper end of left humerus, subsequent encounter for fracture with routine healing: Secondary | ICD-10-CM | POA: Diagnosis not present

## 2022-10-20 DIAGNOSIS — M25612 Stiffness of left shoulder, not elsewhere classified: Secondary | ICD-10-CM | POA: Diagnosis not present

## 2022-10-20 DIAGNOSIS — S42292D Other displaced fracture of upper end of left humerus, subsequent encounter for fracture with routine healing: Secondary | ICD-10-CM | POA: Diagnosis not present

## 2022-10-21 DIAGNOSIS — S42292D Other displaced fracture of upper end of left humerus, subsequent encounter for fracture with routine healing: Secondary | ICD-10-CM | POA: Diagnosis not present

## 2022-10-21 DIAGNOSIS — M25612 Stiffness of left shoulder, not elsewhere classified: Secondary | ICD-10-CM | POA: Diagnosis not present

## 2022-10-22 ENCOUNTER — Ambulatory Visit (INDEPENDENT_AMBULATORY_CARE_PROVIDER_SITE_OTHER): Payer: Medicare HMO | Admitting: Dermatology

## 2022-10-22 ENCOUNTER — Other Ambulatory Visit: Payer: Self-pay | Admitting: Obstetrics & Gynecology

## 2022-10-22 DIAGNOSIS — L819 Disorder of pigmentation, unspecified: Secondary | ICD-10-CM | POA: Diagnosis not present

## 2022-10-22 DIAGNOSIS — Z79899 Other long term (current) drug therapy: Secondary | ICD-10-CM | POA: Diagnosis not present

## 2022-10-22 DIAGNOSIS — H01139 Eczematous dermatitis of unspecified eye, unspecified eyelid: Secondary | ICD-10-CM | POA: Diagnosis not present

## 2022-10-22 DIAGNOSIS — H01135 Eczematous dermatitis of left lower eyelid: Secondary | ICD-10-CM

## 2022-10-22 DIAGNOSIS — L309 Dermatitis, unspecified: Secondary | ICD-10-CM | POA: Diagnosis not present

## 2022-10-22 DIAGNOSIS — L659 Nonscarring hair loss, unspecified: Secondary | ICD-10-CM

## 2022-10-22 DIAGNOSIS — L658 Other specified nonscarring hair loss: Secondary | ICD-10-CM | POA: Diagnosis not present

## 2022-10-22 DIAGNOSIS — Z7189 Other specified counseling: Secondary | ICD-10-CM

## 2022-10-22 MED ORDER — MINOXIDIL 2.5 MG PO TABS: 2.5000 mg | ORAL_TABLET | Freq: Every day | ORAL | 1 refills | Status: DC

## 2022-10-22 MED ORDER — PIMECROLIMUS 1 % EX CREA: TOPICAL_CREAM | CUTANEOUS | 3 refills | Status: DC

## 2022-10-22 MED ORDER — CLOBETASOL PROPIONATE 0.05 % EX FOAM: CUTANEOUS | 2 refills | Status: AC

## 2022-10-22 NOTE — Patient Instructions (Signed)

## 2022-10-22 NOTE — Progress Notes (Signed)
  New Patient Visit   Subjective  Tracey Daniel is a 42 y.o. 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 Currently she is using Minoxidil 2.5mg  po QD and would like to establish care to continue medications. Patient c/o occasional itching and burning of the scalp, and is tolerating Minoxidil 2.5 mg QD well with no s/e or ankle swelling. Pt c/o rash on the eyelids and would like to discuss tx options.   The patient has spots, moles and lesions to be evaluated, some may be new or changing and the patient may have concern these could be cancer.   The following portions of the chart were reviewed this encounter and updated as appropriate: medications, allergies, medical history  Review of Systems:  No other skin or systemic complaints except as noted in HPI or Assessment and Plan.  Objective  Well appearing patient in no apparent distress; mood and affect are within normal limits.    A focused examination was performed of the following areas: the face and scalp   Relevant exam findings are noted in the Assessment and Plan.    Assessment & Plan        Traction alopecia  (?with element of previously diagnosed by Dr. Jorja Loa = central centrifugal scarring alopecia?)   Chronic and persistent condition with duration or expected duration over one year. Condition is improving with treatment but not currently at goal.  Exam: Hair breakage with element of traction alopecia  Continue Minoxidil 2.5 mg po QD,   pt with hx of Minocycline use for bumps of the scalp, consider Doxycycline if bumps occur in the future.   Restart Clobetasol foam QD 3d/wk. Topical steroids (such as triamcinolone, fluocinolone, fluocinonide, mometasone, clobetasol, halobetasol, betamethasone, hydrocortisone) can cause thinning and lightening of the  skin if they are used for too long in the same area. Your physician has selected the right strength medicine for your problem and area affected on the body. Please use your medication only as directed by your physician to prevent side effects.   Eyelid and periorbital dermatitis - eczema changes of the skin Exam: Hyperpigmentation of the eyelid and perioral area  Treatment Plan: Start Pimecrolimus cream to aa's QD-BID PRN.   Return in about 6 months (around 04/22/2023) for alopecia and dermatitis follow up .  Maylene Roes, CMA, am acting as scribe for Armida Sans, MD .   Documentation: I have reviewed the above documentation for accuracy and completeness, and I agree with the above.  Armida Sans, MD

## 2022-10-23 ENCOUNTER — Encounter: Payer: Self-pay | Admitting: Dermatology

## 2022-10-27 DIAGNOSIS — M25612 Stiffness of left shoulder, not elsewhere classified: Secondary | ICD-10-CM | POA: Diagnosis not present

## 2022-10-27 DIAGNOSIS — S42292D Other displaced fracture of upper end of left humerus, subsequent encounter for fracture with routine healing: Secondary | ICD-10-CM | POA: Diagnosis not present

## 2022-10-29 ENCOUNTER — Institutional Professional Consult (permissible substitution): Payer: Self-pay | Admitting: Professional Counselor

## 2022-10-29 DIAGNOSIS — S42292D Other displaced fracture of upper end of left humerus, subsequent encounter for fracture with routine healing: Secondary | ICD-10-CM | POA: Diagnosis not present

## 2022-10-29 DIAGNOSIS — M25612 Stiffness of left shoulder, not elsewhere classified: Secondary | ICD-10-CM | POA: Diagnosis not present

## 2022-10-30 ENCOUNTER — Encounter (HOSPITAL_COMMUNITY): Payer: Self-pay | Admitting: Oncology

## 2022-11-02 ENCOUNTER — Other Ambulatory Visit: Payer: Self-pay | Admitting: Adult Health

## 2022-11-03 DIAGNOSIS — S42292D Other displaced fracture of upper end of left humerus, subsequent encounter for fracture with routine healing: Secondary | ICD-10-CM | POA: Diagnosis not present

## 2022-11-03 DIAGNOSIS — M25612 Stiffness of left shoulder, not elsewhere classified: Secondary | ICD-10-CM | POA: Diagnosis not present

## 2022-11-05 DIAGNOSIS — S42292D Other displaced fracture of upper end of left humerus, subsequent encounter for fracture with routine healing: Secondary | ICD-10-CM | POA: Diagnosis not present

## 2022-11-05 DIAGNOSIS — M25612 Stiffness of left shoulder, not elsewhere classified: Secondary | ICD-10-CM | POA: Diagnosis not present

## 2022-11-06 ENCOUNTER — Ambulatory Visit: Payer: Medicare HMO | Admitting: Professional Counselor

## 2022-11-06 DIAGNOSIS — F411 Generalized anxiety disorder: Secondary | ICD-10-CM

## 2022-11-06 NOTE — Patient Instructions (Addendum)
If your symptoms worsen or you have thoughts of suicide/homicide, PLEASE SEEK IMMEDIATE MEDICAL ATTENTION.  You may always call:   National Suicide Hotline: 988 or 779-444-1878 [place] Crisis Line: (270) 405-7404 These are available 24 hours a day, 7 days a week.    [facility]  [address], [place], [place] [address] [phone] Walk in hours:  Monday, Wednesday and Thursday 8:00 am.
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
## 2022-11-10 DIAGNOSIS — S42292D Other displaced fracture of upper end of left humerus, subsequent encounter for fracture with routine healing: Secondary | ICD-10-CM | POA: Diagnosis not present

## 2022-11-10 DIAGNOSIS — S42255D Nondisplaced fracture of greater tuberosity of left humerus, subsequent encounter for fracture with routine healing: Secondary | ICD-10-CM | POA: Diagnosis not present

## 2022-11-10 DIAGNOSIS — M25612 Stiffness of left shoulder, not elsewhere classified: Secondary | ICD-10-CM | POA: Diagnosis not present

## 2022-11-11 ENCOUNTER — Telehealth (INDEPENDENT_AMBULATORY_CARE_PROVIDER_SITE_OTHER): Payer: Self-pay | Admitting: Professional Counselor

## 2022-11-11 DIAGNOSIS — M542 Cervicalgia: Secondary | ICD-10-CM | POA: Diagnosis not present

## 2022-11-11 DIAGNOSIS — G518 Other disorders of facial nerve: Secondary | ICD-10-CM | POA: Diagnosis not present

## 2022-11-11 DIAGNOSIS — F4310 Post-traumatic stress disorder, unspecified: Secondary | ICD-10-CM

## 2022-11-11 DIAGNOSIS — M791 Myalgia, unspecified site: Secondary | ICD-10-CM | POA: Diagnosis not present

## 2022-11-11 DIAGNOSIS — G43719 Chronic migraine without aura, intractable, without status migrainosus: Secondary | ICD-10-CM | POA: Diagnosis not present
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
## 2022-11-12 ENCOUNTER — Ambulatory Visit: Payer: Medicare HMO | Admitting: Professional Counselor

## 2022-11-12 ENCOUNTER — Ambulatory Visit: Payer: Medicare HMO | Admitting: Adult Health

## 2022-11-12 DIAGNOSIS — M25612 Stiffness of left shoulder, not elsewhere classified: Secondary | ICD-10-CM | POA: Diagnosis not present

## 2022-11-12 DIAGNOSIS — F411 Generalized anxiety disorder: Secondary | ICD-10-CM | POA: Diagnosis not present

## 2022-11-12 DIAGNOSIS — S42292D Other displaced fracture of upper end of left humerus, subsequent encounter for fracture with routine healing: Secondary | ICD-10-CM | POA: Diagnosis not present

## 2022-11-12 NOTE — BH Specialist Note (Signed)
[facility] Telephone Follow-up  MRN: [id number] NAME: Tracey Daniel Date: 11/12/22  Start time: Start Time: 0100 End time: Stop Time: 0130 Total time: Total Time in Minutes (Visit): 30 Call number: Visit Number: 3- Third Visit  Reason for call today:  The patient is a 41 year old female who presented for a collaborative care follow-up session, conducted virtually through a telehealth platform. The behavioral health counselor explained the limitations of telehealth, and the patient agreed to proceed. The primary focus of this visit was to discuss the findings and recommendations from a recent psychiatric consultation. It was determined that the patient is not an appropriate candidate for collaborative care due to the severity of her diagnosis and her active treatment with a psychiatrist.  During the session, the patient disclosed consuming six to eight beers daily for the past three years, which exceeds the criteria for severe alcohol use disorder. Considering the high risk of alcohol withdrawal and the need for targeted substance abuse treatment, the behavioral health counselor recommended that the patient follow up with her psychiatrist and seek immediate medical attention if withdrawal symptoms occur. The patient was informed of the significant risks of continued alcohol use, including potential seizures and other medical complications.  The behavioral health counselor also recommended inpatient substance abuse treatment and specialized trauma therapy, but the patient declined the substance abuse treatment option. However, she expressed openness to engaging in therapy and was provided with local resources, including [facility], where she could be evaluated for further care. The patient stated that she would consider this option. The risks associated with her alcohol use were reiterated, and she was strongly encouraged to follow up with her primary care  provider or psychiatrist for further management. The patient acknowledged the recommendations and appeared to understand the gravity of her situation.  A follow-up plan includes monitoring the patient's engagement with the resources provided and encouraging continued communication with her healthcare providers.  PHQ-9 Scores:     11/06/2022    1:55 PM 08/13/2022   11:06 AM 08/13/2022   10:20 AM 06/09/2022   10:38 AM 03/02/2022    8:15 AM  Depression screen PHQ 2/9  Decreased Interest 1 1 1 2  0  Down, Depressed, Hopeless 0 0 0 0 0  PHQ - 2 Score 1 1 1 2  0  Altered sleeping 0   0 0  Tired, decreased energy 1   1 0  Change in appetite 0   0 0  Feeling bad or failure about yourself  3   3 0  Trouble concentrating 0   0 0  Moving slowly or fidgety/restless 0   0 0  Suicidal thoughts 0   0 0  PHQ-9 Score 5   6 0  Difficult doing work/chores Somewhat difficult   Not difficult at all    GAD-7 Scores:     11/06/2022    1:55 PM 08/13/2022   11:07 AM 08/13/2022   10:20 AM 06/09/2022   10:39 AM  GAD 7 : Generalized Anxiety Score  Nervous, Anxious, on Edge 2 2 1  0  Control/stop worrying 2 2 1 3   Worry too much - different things 2 2 1 3   Trouble relaxing 0 2 0   Restless 0 0 0 0  Easily annoyed or irritable 1 1 1 2   Afraid - awful might happen 2 2 1 3   Total GAD 7 Score 9 11 5    Anxiety Difficulty Somewhat difficult Somewhat difficult Not difficult  at all Not difficult at all    Stress Current stressors:  Grief Sleep:  Fair Appetite:  Fair Coping ability:  Fair Patient taking medications as prescribed:  Yes  Current medications:  Outpatient Encounter Medications as of 11/12/2022  Medication Sig   albuterol (VENTOLIN HFA) 108 (90 Base) MCG/ACT inhaler INHALE 2 PUFFS INTO THE LUNGS EVERY 6 HOURS AS NEEDED FOR WHEEZING OR SHORTNESS OF BREATH   azelastine (ASTELIN) 0.1 % nasal spray USE 2 SPRAYS IN EACH NOSTRIL TWICE DAILY AS DIRECTED   budesonide-formoterol (SYMBICORT) 80-4.5 MCG/ACT  inhaler INHALE 2 PUFFS INTO THE LUNGS TWICE DAILY   buPROPion (WELLBUTRIN SR) 150 MG 12 hr tablet TAKE 1 TABLET(150 MG) BY MOUTH TWICE DAILY   calcium-vitamin D (OSCAL WITH D) 500-5 MG-MCG tablet Take 1 tablet by mouth 2 (two) times daily.   carbamide peroxide (DEBROX) 6.5 % OTIC solution Place 5 drops into the right ear 2 (two) times daily. (Patient taking differently: Place 5 drops into the right ear as needed (itching).)   clobetasol (OLUX) 0.05 % topical foam Apply to aa's scalp QD-BID 3 days per week. Avoid applying to face, groin, and axilla. Use as directed. Long-term use can cause thinning of the skin.   denosumab (PROLIA) 60 MG/ML SOSY injection Inject 60 mg into the skin every 6 (six) months.   estradiol (ESTRACE) 2 MG tablet TAKE 1 TABLET(2 MG) BY MOUTH DAILY   feeding supplement (ENSURE CLINICAL STRENGTH) LIQD Take 237 mLs by mouth 3 (three) times daily with meals.   fenofibrate (TRICOR) 48 MG tablet TAKE 1 TABLET(48 MG) BY MOUTH DAILY   fluticasone (FLONASE) 50 MCG/ACT nasal spray Place 2 sprays into both nostrils daily.   gabapentin (NEURONTIN) 100 MG capsule TAKE 1 CAPSULE(100 MG) BY MOUTH THREE TIMES DAILY   linaclotide (LINZESS) 145 MCG CAPS capsule Take 1 capsule (145 mcg total) by mouth daily before breakfast.   megestrol (MEGACE) 40 MG tablet Take 1 tablet (40 mg total) by mouth daily.   metoCLOPramide (REGLAN) 5 MG tablet TAKE 1/2 TABLET BY MOUTH ONCE DAILY BEFORE A MEAL. MAY TAKE A SECOND DOSE IF NEEDED   minocycline (MINOCIN) 50 MG capsule Take 1 capsule (50 mg total) by mouth 2 (two) times daily.   minoxidil (LONITEN) 2.5 MG tablet Take 1 tablet (2.5 mg total) by mouth daily.   olmesartan (BENICAR) 20 MG tablet TAKE 1 TABLET(20 MG) BY MOUTH DAILY   ondansetron (ZOFRAN-ODT) 4 MG disintegrating tablet DISSOLVE 1 TABLET(4 MG) ON THE TONGUE EVERY 8 HOURS AS NEEDED FOR NAUSEA OR VOMITING   pantoprazole (PROTONIX) 40 MG tablet TAKE 1 TABLET(40 MG) BY MOUTH TWICE DAILY BEFORE A  MEAL   pimecrolimus (ELIDEL) 1 % cream Apply to eczema of the eyelids BID PRN.   potassium chloride SA (KLOR-CON M) 20 MEQ tablet TAKE 1 TABLET BY MOUTH EVERY DAY   prazosin (MINIPRESS) 1 MG capsule Take 1 mg by mouth at bedtime.   progesterone (PROMETRIUM) 200 MG capsule TAKE 1 CAPSULE BY MOUTH DAILY AT BEDTIME, DAYS 1-21 OF EACH MONTH   propranolol (INDERAL) 10 MG tablet Take 1 tablet (10 mg total) by mouth 3 (three) times daily.   risperiDONE (RISPERDAL) 0.5 MG tablet Take 0.5 mg at bedtime by mouth.   rosuvastatin (CRESTOR) 20 MG tablet TAKE 1 TABLET(20 MG) BY MOUTH DAILY   sertraline (ZOLOFT) 50 MG tablet Take 50 mg by mouth at bedtime.    sucralfate (CARAFATE) 1 g tablet TAKE 1 TABLET BY MOUTH  UP TO  TWICE DAILY AS NEEDED FOR BURNING IN STOMACH   tiZANidine (ZANAFLEX) 4 MG tablet TAKE 1 TABLET(4 MG) BY MOUTH TWICE DAILY AS NEEDED FOR BACK SPASMS   Vitamin D, Ergocalciferol, (DRISDOL) 1.25 MG (50000 UNIT) CAPS capsule TAKE 1 CAPSULE BY MOUTH 1 TIME A WEEK   zonisamide (ZONEGRAN) 50 MG capsule Take 150 mg by mouth at bedtime.    No facility-administered encounter medications on file as of 11/12/2022.     Self-harm Behaviors Risk Assessment Self-harm risk factors:  Past attempt, past thoughts, bipolar, ptsd, substance use Patient endorses recent thoughts of harming self:  Denies   Danger to Others Risk Assessment Danger to others risk factors:  Past thoughts Patient endorses recent thoughts of harming others:  Denies   Substance Use Assessment Patient recently consumed alcohol:  Yes    Goals, Interventions and Follow-up Plan Goals: Increase healthy adjustment to current life circumstances Interventions: Motivational Interviewing Follow-up Plan: Refer to [facility]   Reuel Boom

## 2022-11-17 DIAGNOSIS — M25612 Stiffness of left shoulder, not elsewhere classified: Secondary | ICD-10-CM | POA: Diagnosis not present

## 2022-11-17 DIAGNOSIS — S42292D Other displaced fracture of upper end of left humerus, subsequent encounter for fracture with routine healing: Secondary | ICD-10-CM | POA: Diagnosis not present

## 2022-11-23 ENCOUNTER — Other Ambulatory Visit: Payer: Self-pay | Admitting: Family Medicine

## 2022-11-23 DIAGNOSIS — I10 Essential (primary) hypertension: Secondary | ICD-10-CM

## 2022-11-24 DIAGNOSIS — M25612 Stiffness of left shoulder, not elsewhere classified: Secondary | ICD-10-CM | POA: Diagnosis not present

## 2022-11-24 DIAGNOSIS — S42292D Other displaced fracture of upper end of left humerus, subsequent encounter for fracture with routine healing: Secondary | ICD-10-CM | POA: Diagnosis not present

## 2022-12-01 DIAGNOSIS — S42292D Other displaced fracture of upper end of left humerus, subsequent encounter for fracture with routine healing: Secondary | ICD-10-CM | POA: Diagnosis not present

## 2022-12-01 DIAGNOSIS — M25612 Stiffness of left shoulder, not elsewhere classified: Secondary | ICD-10-CM | POA: Diagnosis not present

## 2022-12-07 DIAGNOSIS — M25612 Stiffness of left shoulder, not elsewhere classified: Secondary | ICD-10-CM | POA: Diagnosis not present

## 2022-12-07 DIAGNOSIS — S42292D Other displaced fracture of upper end of left humerus, subsequent encounter for fracture with routine healing: Secondary | ICD-10-CM | POA: Diagnosis not present

## 2022-12-08 DIAGNOSIS — M25512 Pain in left shoulder: Secondary | ICD-10-CM | POA: Diagnosis not present

## 2022-12-08 DIAGNOSIS — M7502 Adhesive capsulitis of left shoulder: Secondary | ICD-10-CM | POA: Diagnosis not present

## 2022-12-09 DIAGNOSIS — S42292D Other displaced fracture of upper end of left humerus, subsequent encounter for fracture with routine healing: Secondary | ICD-10-CM | POA: Diagnosis not present

## 2022-12-09 DIAGNOSIS — M25612 Stiffness of left shoulder, not elsewhere classified: Secondary | ICD-10-CM | POA: Diagnosis not present

## 2022-12-18 ENCOUNTER — Other Ambulatory Visit: Payer: Self-pay | Admitting: Gastroenterology

## 2022-12-18 DIAGNOSIS — K3184 Gastroparesis: Secondary | ICD-10-CM

## 2022-12-21 ENCOUNTER — Other Ambulatory Visit: Payer: Self-pay | Admitting: Family Medicine

## 2022-12-21 NOTE — Telephone Encounter (Signed)
Needs routine follow-up. Please arrange.

## 2022-12-22 NOTE — Telephone Encounter (Signed)
noted 

## 2022-12-23 DIAGNOSIS — M25612 Stiffness of left shoulder, not elsewhere classified: Secondary | ICD-10-CM | POA: Diagnosis not present

## 2022-12-23 DIAGNOSIS — S42292D Other displaced fracture of upper end of left humerus, subsequent encounter for fracture with routine healing: Secondary | ICD-10-CM | POA: Diagnosis not present

## 2022-12-24 ENCOUNTER — Encounter: Payer: Self-pay | Admitting: Family Medicine

## 2022-12-24 ENCOUNTER — Ambulatory Visit (INDEPENDENT_AMBULATORY_CARE_PROVIDER_SITE_OTHER): Payer: Medicare HMO | Admitting: Family Medicine

## 2022-12-24 VITALS — BP 168/109 | HR 85 | Ht 66.0 in | Wt 172.1 lb

## 2022-12-24 DIAGNOSIS — Z23 Encounter for immunization: Secondary | ICD-10-CM | POA: Diagnosis not present

## 2022-12-24 DIAGNOSIS — F4321 Adjustment disorder with depressed mood: Secondary | ICD-10-CM

## 2022-12-24 DIAGNOSIS — J302 Other seasonal allergic rhinitis: Secondary | ICD-10-CM

## 2022-12-24 DIAGNOSIS — I10 Essential (primary) hypertension: Secondary | ICD-10-CM

## 2022-12-24 DIAGNOSIS — F17218 Nicotine dependence, cigarettes, with other nicotine-induced disorders: Secondary | ICD-10-CM

## 2022-12-24 DIAGNOSIS — K219 Gastro-esophageal reflux disease without esophagitis: Secondary | ICD-10-CM

## 2022-12-24 DIAGNOSIS — E782 Mixed hyperlipidemia: Secondary | ICD-10-CM

## 2022-12-24 DIAGNOSIS — F4381 Prolonged grief disorder: Secondary | ICD-10-CM

## 2022-12-24 NOTE — Patient Instructions (Addendum)
F/U in 4 to 6 weeks, call if you need me sooner   Need to take BP medication EVERY day  Reach out for therapy and let us know if you are having difficulty getting this after you speak with your Psychiatrist  Flu vaccine today  Need TdAP and covid vacciens from your pharmacy  Heartfelt condolence  Thanks for choosing [facility], we consider it a privelige to serve you.

## 2022-12-28 ENCOUNTER — Other Ambulatory Visit (HOSPITAL_COMMUNITY)
Admission: RE | Admit: 2022-12-28 | Discharge: 2022-12-28 | Disposition: A | Discharge status: Home / Self Care | Payer: Medicare HMO | Source: Ambulatory Visit | Attending: Obstetrics & Gynecology | Admitting: Obstetrics & Gynecology

## 2022-12-28 ENCOUNTER — Encounter: Payer: Self-pay | Admitting: Adult Health

## 2022-12-28 ENCOUNTER — Ambulatory Visit (INDEPENDENT_AMBULATORY_CARE_PROVIDER_SITE_OTHER): Payer: Medicare HMO | Admitting: Adult Health

## 2022-12-28 VITALS — BP 150/87 | HR 68 | Ht 66.0 in | Wt 172.0 lb

## 2022-12-28 DIAGNOSIS — Z01411 Encounter for gynecological examination (general) (routine) with abnormal findings: Secondary | ICD-10-CM | POA: Insufficient documentation

## 2022-12-28 DIAGNOSIS — R87612 Low grade squamous intraepithelial lesion on cytologic smear of cervix (LGSIL): Secondary | ICD-10-CM | POA: Diagnosis not present

## 2022-12-28 DIAGNOSIS — Z8742 Personal history of other diseases of the female genital tract: Secondary | ICD-10-CM | POA: Diagnosis not present

## 2022-12-28 DIAGNOSIS — R87810 Cervical high risk human papillomavirus (HPV) DNA test positive: Secondary | ICD-10-CM | POA: Diagnosis not present

## 2022-12-28 DIAGNOSIS — Z1211 Encounter for screening for malignant neoplasm of colon: Secondary | ICD-10-CM | POA: Insufficient documentation

## 2022-12-28 DIAGNOSIS — Z01419 Encounter for gynecological examination (general) (routine) without abnormal findings: Secondary | ICD-10-CM | POA: Insufficient documentation

## 2022-12-28 DIAGNOSIS — R232 Flushing: Secondary | ICD-10-CM | POA: Diagnosis not present

## 2022-12-28 DIAGNOSIS — Z79890 Hormone replacement therapy (postmenopausal): Secondary | ICD-10-CM

## 2022-12-28 DIAGNOSIS — I10 Essential (primary) hypertension: Secondary | ICD-10-CM

## 2022-12-28 DIAGNOSIS — Z1151 Encounter for screening for human papillomavirus (HPV): Secondary | ICD-10-CM

## 2022-12-28 LAB — HEMOCCULT GUIAC POC 1CARD (OFFICE): Fecal Occult Blood, POC: NEGATIVE

## 2022-12-28 MED ORDER — ESTRADIOL 2 MG PO TABS: 2.0000 mg | ORAL_TABLET | Freq: Every day | ORAL | 4 refills | Status: AC

## 2022-12-28 MED ORDER — PREMARIN 0.625 MG/GM VA CREA: TOPICAL_CREAM | VAGINAL | 1 refills | Status: AC

## 2022-12-28 MED ORDER — PROGESTERONE 200 MG PO CAPS: ORAL_CAPSULE | ORAL | 11 refills | Status: AC

## 2022-12-28 NOTE — Addendum Note (Signed)
Addended by: Freddie Apley R on: 12/28/2022 11:56 AM   Modules accepted: Orders

## 2022-12-28 NOTE — Progress Notes (Signed)
Patient ID: Tracey Daniel, female   DOB: [birth year], 42 y.o.   MRN: [id number] History of Present Illness: Tracey Daniel is a 42 year old black female, with SO, PM in for a well woman gyn exam and pap. Her pap last year was ASCUS HPV, had colpo, 07/25/20 LSIL  PCP is Dr Lodema Hong  Current Medications, Allergies, Past Medical History, Past Surgical History, Family History and Social History were reviewed in [employer] electronic medical record.     Review of Systems: Patient denies any headaches, hearing loss, fatigue, blurred vision, shortness of breath, chest pain, abdominal pain, problems with bowel movements, urination, or intercourse. No joint pain or mood swings.  Denies any vaginal bleeding Requests refill on cream for vaginal dryness Has some hot flashes still   Physical Exam:BP (!) 150/87 (BP Location: Right Arm, Patient Position: Sitting, Cuff Size: Normal)   Pulse 68   Ht 5\' 6"  (1.676 m)   Wt 172 lb (78 kg)   LMP  (LMP Unknown) Comment: pt reports premature menopause  BMI 27.76 kg/m   General:  Well developed, well nourished, no acute distress Skin:  Warm and dry Neck:  Midline trachea, normal thyroid, good ROM, no lymphadenopathy Lungs; Clear to auscultation bilaterally Breast:  No dominant palpable mass, retraction, or nipple discharge Cardiovascular: Regular rate and rhythm Abdomen:  Soft, non tender, no hepatosplenomegaly Pelvic:  External genitalia is normal in appearance, no lesions.  The vagina is normal in appearance. Urethra has no lesions or masses. The cervix is smooth, pap with HR HPV genotyping performed.  Uterus is felt to be normal size, shape, and contour.  No adnexal masses or tenderness noted.Bladder is non tender, no masses felt. Rectal: Good sphincter tone, no polyps, or hemorrhoids felt.  Hemoccult negative. Extremities/musculoskeletal:  No swelling or varicosities noted, no clubbing or cyanosis Psych:  No mood changes, alert and cooperative,seems happy AA  is 8, discussed having only 1 drink per day  Fall risk is moderate    12/28/2022   11:03 AM 12/24/2022   10:34 AM 12/24/2022   10:00 AM  Depression screen PHQ 2/9  Decreased Interest 0 2 2  Down, Depressed, Hopeless 0 3 0  PHQ - 2 Score 0 5 2  Altered sleeping 0 0 0  Tired, decreased energy 1 1 0  Change in appetite 0 0 0  Feeling bad or failure about yourself  2 3 2   Trouble concentrating 0 1 0  Moving slowly or fidgety/restless 0 0 0  Suicidal thoughts 0 2 0  PHQ-9 Score 3 12 4   Difficult doing work/chores   Somewhat difficult       12/28/2022   11:04 AM 12/24/2022   10:00 AM 11/06/2022    1:55 PM 08/13/2022   11:07 AM  GAD 7 : Generalized Anxiety Score  Nervous, Anxious, on Edge 2 2 2 2   Control/stop worrying 2 2 2 2   Worry too much - different things 2 2 2 2   Trouble relaxing 0 0 0 2  Restless 0 0 0 0  Easily annoyed or irritable 2 2 1 1   Afraid - awful might happen 2 2 2 2   Total GAD 7 Score 10 10 9 11   Anxiety Difficulty  Somewhat difficult Somewhat difficult Somewhat difficult      Upstream - 12/28/22 1102       Pregnancy Intention Screening   Does the patient want to become pregnant in the next year? No    Would the patient like to  discuss contraceptive options today? N/A      Contraception Wrap Up   Current Method No Method - Other Reason    Reason for No Current Contraceptive Method at Intake ([facility] Only) Other   Post menopausal   End Method No Method - Other Reason    Contraception Counseling Provided No             Examination chaperoned by Freddie Apley RN  Impression and plan: 1. Encounter for gynecological examination with Papanicolaou smear of cervix Pap sent Pap in 3 years if normal Physical in 1 year Labs with PCP Mammogram was normal 08/03/22 Colonoscopy at 43  2. History of abnormal cervical Pap smear Pap sent  3. Hot flashes On HRT  4. Essential hypertension Take BP meds and follow up with PCP  5. Encounter for screening fecal  occult blood testing Hemoccult was negative  - POCT occult blood stool  6. Post-menopause on HRT (hormone replacement therapy) Refilled estrace 2 mg 1 daily and Prometrium  Rx sent for PVC for vaginal dryness    Meds ordered this encounter  Medications   progesterone (PROMETRIUM) 200 MG capsule    Sig: TAKE 1 CAPSULE BY MOUTH DAILY AT BEDTIME, DAYS 1-21 OF EACH MONTH    Dispense:  21 capsule    Refill:  11    Order Specific Question:   Supervising Provider    Answer:   Duane Lope H [[id number]]   estradiol (ESTRACE) 2 MG tablet    Sig: Take 1 tablet (2 mg total) by mouth daily.    Dispense:  90 tablet    Refill:  4    Order Specific Question:   Supervising Provider    Answer:   Duane Lope H [[id number]]   conjugated estrogens (PREMARIN) vaginal cream    Sig: Use 0.5 gm in vaginal 2-3 week    Dispense:  42.5 g    Refill:  1    Order Specific Question:   Supervising Provider    Answer:   Duane Lope H [[id number]]

## 2022-12-29 DIAGNOSIS — S42292D Other displaced fracture of upper end of left humerus, subsequent encounter for fracture with routine healing: Secondary | ICD-10-CM | POA: Diagnosis not present

## 2022-12-29 DIAGNOSIS — M25612 Stiffness of left shoulder, not elsewhere classified: Secondary | ICD-10-CM | POA: Diagnosis not present

## 2022-12-30 DIAGNOSIS — M791 Myalgia, unspecified site: Secondary | ICD-10-CM | POA: Diagnosis not present

## 2022-12-30 DIAGNOSIS — G518 Other disorders of facial nerve: Secondary | ICD-10-CM | POA: Diagnosis not present

## 2022-12-30 DIAGNOSIS — G43719 Chronic migraine without aura, intractable, without status migrainosus: Secondary | ICD-10-CM | POA: Diagnosis not present

## 2022-12-30 DIAGNOSIS — M542 Cervicalgia: Secondary | ICD-10-CM | POA: Diagnosis not present

## 2022-12-30 LAB — CYTOLOGY - PAP
Comment: NEGATIVE
Comment: NEGATIVE
Comment: NEGATIVE
HPV 16: NEGATIVE
HPV 18 / 45: NEGATIVE
High risk HPV: POSITIVE — AB

## 2022-12-31 ENCOUNTER — Other Ambulatory Visit: Payer: Self-pay | Admitting: Adult Health

## 2022-12-31 DIAGNOSIS — S42292D Other displaced fracture of upper end of left humerus, subsequent encounter for fracture with routine healing: Secondary | ICD-10-CM | POA: Diagnosis not present

## 2022-12-31 DIAGNOSIS — M25612 Stiffness of left shoulder, not elsewhere classified: Secondary | ICD-10-CM | POA: Diagnosis not present

## 2022-12-31 MED ORDER — FLUCONAZOLE 150 MG PO TABS: ORAL_TABLET | ORAL | 1 refills | Status: DC

## 2023-01-02 NOTE — Progress Notes (Unsigned)
Referring Provider: Kerri Perches, MD Primary Care Physician:  Kerri Perches, MD Primary GI Physician: Dr. Jena Gauss  No chief complaint on file.   HPI:   Tracey Daniel is a 42 y.o. female with history of constipation, GERD, gastroparesis, gastric outlet obstruction secondary to recurrent pylorospasm/pyloric stenosis requiring repeat EGDs with dilation/Botox injections with last in 2020, presenting today for follow-up.   Last seen in the office 10/30/2021.  GERD well-controlled on pantoprazole 40 mg twice daily.  Using Carafate about once a week for breakthrough.  Gastroparesis well-controlled on Reglan 2.5 mg twice daily.  Rarely using Zofran.  Constipation well-controlled on Linzess 145 mcg daily.  Plan to try decreasing Reglan to 2.5 mg daily, otherwise continue current regimen.  Today: GERD:  Gastroparesis:  Constipation:   Past Medical History:  Diagnosis Date   Alopecia    Anemia of other chronic disease 11/29/2012   Asthma    Back pain, chronic    Bronchitis    Chronic abdominal pain    Constipation    COPD (chronic obstructive pulmonary disease) (HCC)    Cyst of skin    mid spine area   Depression 2000   h/o suicidal ideation   Gastric outlet obstruction    Gastritis    Gastroparesis    GERD (gastroesophageal reflux disease)    Hypertension    LGSIL of cervix of undetermined significance 07/08/2020   07/08/20 pt aware will get colpo, per [facility]   Migraine    Migraines    Nausea and vomiting    recurrent   Nicotine addiction    Osteoporosis    Peptic ulcer disease    Pyloric spasm 03/30/2011   Seasonal allergies    Sinusitis    Substance abuse (HCC) 2008   marijuana   Vaginal Pap smear, abnormal    Vitamin D deficiency     Past Surgical History:  Procedure Laterality Date   BALLOON DILATION N/A 02/09/2013   Procedure: BALLOON DILATION;  Surgeon: Barrie Folk, MD;  Location: [facility] ENDOSCOPY;  Service: Endoscopy;  Laterality: N/A;    BALLOON DILATION N/A 03/10/2018   Procedure: BALLOON DILATION;  Surgeon: Corbin Ade, MD;  Location: [facility];  Service: Endoscopy;  Laterality: N/A;   BILIARY DILATION  06/01/2018   Procedure: PYLORIC DILATION;  Surgeon: Meridee Score Netty Starring., MD;  Location: [facility] ENDOSCOPY;  Service: Gastroenterology;;   BIOPSY  06/01/2018   Procedure: BIOPSY;  Surgeon: Lemar Lofty., MD;  Location: [facility] ENDOSCOPY;  Service: Gastroenterology;;   BOTOX INJECTION N/A 02/09/2013   Procedure: BOTOX INJECTION;  Surgeon: Barrie Folk, MD;  Location: [facility]COPY;  Service: Endoscopy;  Laterality: N/A;  possible balloon   BOTOX INJECTION N/A 10/24/2015   Procedure: BOTOX INJECTION;  Surgeon: Corbin Ade, MD;  Location: [facility];  Service: Endoscopy;  Laterality: N/A;   BOTOX INJECTION N/A 03/10/2018   Procedure: BOTOX INJECTION;  Surgeon: Corbin Ade, MD;  Location: [facility];  Service: Endoscopy;  Laterality: N/A;  Melanie notified   BOTOX INJECTION  06/01/2018   Procedure: BOTOX INJECTION;  Surgeon: Meridee Score Netty Starring., MD;  Location: [facility] ENDOSCOPY;  Service: Gastroenterology;;   CARPAL TUNNEL RELEASE     left hand   COLONOSCOPY WITH PROPOFOL N/A 09/20/2014   RMR: Normal ileo-colonoscopy   ESOPHAGEAL DILATION  12/25/2010   Procedure: ESOPHAGEAL DILATION;  Surgeon: [name], MD;  Location: [facility];  Service: Endoscopy;;   ESOPHAGEAL MANOMETRY N/A 10/03/2018  Surgeon: Napoleon Form, MD;  Normal.   ESOPHAGOGASTRODUODENOSCOPY  12/25/2010   [name], MD;  moderate gastritis, ?goo secondary to pylorspasm. BX showed reactive gstropathy no h.pyori, SB mucosa with intramucosal lymphocytosis and partial villous blunting (TTG 4.0 normal)   ESOPHAGOGASTRODUODENOSCOPY N/A 11/14/2012   ZOX:WRUEA hiatal hernia. Abnormal gastric mucosa of  uncertain significance-status post biopsy. Subjectively, patient may have recurrent symptomatic, pylorospasm.    ESOPHAGOGASTRODUODENOSCOPY (EGD) WITH PROPOFOL N/A 02/09/2013   elongated stomach, partial lower esophageal ring widely patent. No obvious pyloric stenosis s/p Botox   ESOPHAGOGASTRODUODENOSCOPY (EGD) WITH PROPOFOL N/A 10/24/2015   Procedure: ESOPHAGOGASTRODUODENOSCOPY (EGD) WITH PROPOFOL;  Surgeon: Corbin Ade, MD;  Location: [facility];  Service: Endoscopy;  Laterality: N/A;  830    ESOPHAGOGASTRODUODENOSCOPY (EGD) WITH PROPOFOL N/A 03/10/2018   Dr. Jena Gauss: normal esophagus, elongated, dilated stomach likely stigmata of slow gastric emptying due to narrowing of pyloric channel, s/p balloon dilatation of pyloric channel. Small hiatal hernia, normal duodenal bulb and second portion of duodenum   ESOPHAGOGASTRODUODENOSCOPY (EGD) WITH PROPOFOL N/A 06/01/2018   Surgeon: Lemar Lofty., MD; normal esophagus s/p biopsy, small hiatal hernia, erythematous mucosa in the cardia, gastric fundus, gastric body, erosive gastropathy s/p biopsy, normal pylorus s/p dilation and Botox injection, normal duodenum s/p biopsy.  Surgical pathology all benign.   LAPAROSCOPIC CHOLECYSTECTOMY  2002   [facility]?  [place]?   laser surgery on cervix     NASAL SEPTUM SURGERY  01/29/2017   ORIF HUMERUS FRACTURE Left 09/29/2022   Procedure: OPEN REDUCTION INTERNAL FIXATION (ORIF) PROXIMAL HUMERUS FRACTURE;  Surgeon: Luci Bank, MD;  Location: [facility] OR;  Service: Orthopedics;  Laterality: Left;   TOE SURGERY      Current Outpatient Medications  Medication Sig Dispense Refill   fluconazole (DIFLUCAN) 150 MG tablet Take 1 now and 1 in 3 days, DO NOT TAKE CRESTOR when taking diflucan 2 tablet 1   albuterol (VENTOLIN HFA) 108 (90 Base) MCG/ACT inhaler INHALE 2 PUFFS INTO THE LUNGS EVERY 6 HOURS AS NEEDED FOR WHEEZING OR SHORTNESS OF BREATH 6.7 g 0   azelastine (ASTELIN) 0.1 % nasal spray USE 2 SPRAYS IN EACH NOSTRIL TWICE DAILY AS DIRECTED 30 mL 2   budesonide-formoterol (SYMBICORT) 80-4.5 MCG/ACT inhaler INHALE  2 PUFFS INTO THE LUNGS TWICE DAILY 10.2 g 0   buPROPion (WELLBUTRIN SR) 150 MG 12 hr tablet TAKE 1 TABLET(150 MG) BY MOUTH TWICE DAILY 60 tablet 5   calcium-vitamin D (OSCAL WITH D) 500-5 MG-MCG tablet Take 1 tablet by mouth 2 (two) times daily. 60 tablet 11   carbamide peroxide (DEBROX) 6.5 % OTIC solution Place 5 drops into the right ear 2 (two) times daily. (Patient taking differently: Place 5 drops into the right ear as needed (itching).) 15 mL 0   clobetasol (OLUX) 0.05 % topical foam Apply to aa's scalp QD-BID 3 days per week. Avoid applying to face, groin, and axilla. Use as directed. Long-term use can cause thinning of the skin. 50 g 2   conjugated estrogens (PREMARIN) vaginal cream Use 0.5 gm in vaginal 2-3 week 42.5 g 1   denosumab (PROLIA) 60 MG/ML SOSY injection Inject 60 mg into the skin every 6 (six) months. 1 mL 3   estradiol (ESTRACE) 2 MG tablet Take 1 tablet (2 mg total) by mouth daily. 90 tablet 4   feeding supplement (ENSURE CLINICAL STRENGTH) LIQD Take 237 mLs by mouth 3 (three) times daily with meals. 10 Bottle 3   fenofibrate (TRICOR) 48  MG tablet TAKE 1 TABLET(48 MG) BY MOUTH DAILY 30 tablet 5   fluticasone (FLONASE) 50 MCG/ACT nasal spray Place 2 sprays into both nostrils daily. 16 g 6   gabapentin (NEURONTIN) 100 MG capsule TAKE 1 CAPSULE(100 MG) BY MOUTH THREE TIMES DAILY 90 capsule 5   linaclotide (LINZESS) 145 MCG CAPS capsule Take 1 capsule (145 mcg total) by mouth daily before breakfast. 30 capsule 5   megestrol (MEGACE) 40 MG tablet Take 1 tablet (40 mg total) by mouth daily. 30 tablet 11   metoCLOPramide (REGLAN) 5 MG tablet TAKE 1/2 TABLET BY MOUTH ONCE DAILY BEFORE A MEAL. MAY TAKE A SECOND DOSE IF NEEDED 90 tablet 0   minocycline (MINOCIN) 50 MG capsule Take 1 capsule (50 mg total) by mouth 2 (two) times daily. (Patient not taking: Reported on 12/28/2022) 60 capsule 2   minoxidil (LONITEN) 2.5 MG tablet Take 1 tablet (2.5 mg total) by mouth daily. 90 tablet 1    olmesartan (BENICAR) 20 MG tablet TAKE 1 TABLET(20 MG) BY MOUTH DAILY 30 tablet 2   ondansetron (ZOFRAN-ODT) 4 MG disintegrating tablet DISSOLVE 1 TABLET(4 MG) ON THE TONGUE EVERY 8 HOURS AS NEEDED FOR NAUSEA OR VOMITING 30 tablet 3   pantoprazole (PROTONIX) 40 MG tablet TAKE 1 TABLET(40 MG) BY MOUTH TWICE DAILY BEFORE A MEAL 180 tablet 3   pimecrolimus (ELIDEL) 1 % cream Apply to eczema of the eyelids BID PRN. 30 g 3   potassium chloride SA (KLOR-CON M) 20 MEQ tablet TAKE 1 TABLET BY MOUTH EVERY DAY 90 tablet 3   prazosin (MINIPRESS) 1 MG capsule Take 1 mg by mouth at bedtime.     progesterone (PROMETRIUM) 200 MG capsule TAKE 1 CAPSULE BY MOUTH DAILY AT BEDTIME, DAYS 1-21 OF EACH MONTH 21 capsule 11   propranolol (INDERAL) 10 MG tablet Take 1 tablet (10 mg total) by mouth 3 (three) times daily. 90 tablet 2   risperiDONE (RISPERDAL) 0.5 MG tablet Take 0.5 mg at bedtime by mouth.     rosuvastatin (CRESTOR) 20 MG tablet TAKE 1 TABLET(20 MG) BY MOUTH DAILY 30 tablet 5   sertraline (ZOLOFT) 50 MG tablet Take 50 mg by mouth at bedtime.      sucralfate (CARAFATE) 1 g tablet TAKE 1 TABLET BY MOUTH  UP TO TWICE DAILY AS NEEDED FOR BURNING IN STOMACH 180 tablet 1   tiZANidine (ZANAFLEX) 4 MG tablet TAKE 1 TABLET(4 MG) BY MOUTH TWICE DAILY AS NEEDED FOR BACK SPASMS 180 tablet 1   Vitamin D, Ergocalciferol, (DRISDOL) 1.25 MG (50000 UNIT) CAPS capsule TAKE 1 CAPSULE BY MOUTH 1 TIME A WEEK 5 capsule 6   zonisamide (ZONEGRAN) 50 MG capsule Take 150 mg by mouth at bedtime.   2   No current facility-administered medications for this visit.    Allergies as of 01/04/2023 - Review Complete 12/28/2022  Allergen Reaction Noted   Benadryl [diphenhydramine] Anaphylaxis 03/25/2014   Penicillins Other (See Comments)    Latex Itching and Other (See Comments) 11/30/2011    Family History  Problem Relation Age of Onset   Lung cancer Mother 34   Cancer Father    Diabetes Father    Liver disease Father         liver transplant at [facility], age 58   Multiple sclerosis Sister 70   Depression Sister 16   Alcohol abuse Sister    Bipolar disorder Sister    Schizophrenia Sister    Heart disease Maternal Grandmother    Parkinson's disease  Maternal Grandfather    Cancer - Colon Neg Hx     Social History   Socioeconomic History   Marital status: Significant Other    Spouse name: Not on file   Number of children: 0   Years of education: 9   Highest education level: 9th grade  Occupational History   Occupation: disability    Employer: UNEMPLOYED  Tobacco Use   Smoking status: Every Day    Current packs/day: 0.25    Average packs/day: 0.3 packs/day for 15.0 years (3.8 ttl pk-yrs)    Types: Cigarettes   Smokeless tobacco: Never   Tobacco comments:    2 per day  Vaping Use   Vaping status: Never Used  Substance and Sexual Activity   Alcohol use: Yes   Drug use: Yes    Frequency: 2.0 times per week    Types: Marijuana    Comment: once or twice a week   Sexual activity: Yes    Birth control/protection: Post-menopausal  Other Topics Concern   Not on file  Social History Narrative   Not on file   Social Drivers of Health   Financial Resource Strain: Medium Risk (12/28/2022)   Overall Financial Resource Strain (CARDIA)    Difficulty of Paying Living Expenses: Somewhat hard  Food Insecurity: Food Insecurity Present (12/28/2022)   Hunger Vital Sign    Worried About Running Out of Food in the Last Year: Often true    Ran Out of Food in the Last Year: Often true  Transportation Needs: No Transportation Needs (12/28/2022)   PRAPARE - Administrator, Civil Service (Medical): No    Lack of Transportation (Non-Medical): No  Physical Activity: Insufficiently Active (12/28/2022)   Exercise Vital Sign    Days of Exercise per Week: 2 days    Minutes of Exercise per Session: 30 min  Stress: No Stress Concern Present (12/28/2022)   [employer] of Occupational Health - Occupational  Stress Questionnaire    Feeling of Stress : Not at all  Social Connections: Moderately Isolated (12/28/2022)   Social Connection and Isolation Panel [NHANES]    Frequency of Communication with Friends and Family: Twice a week    Frequency of Social Gatherings with Friends and Family: Once a week    Attends Religious Services: Never    Database administrator or Organizations: No    Attends Engineer, structural: Never    Marital Status: Living with partner    Review of Systems: Gen: Denies fever, chills, anorexia. Denies fatigue, weakness, weight loss.  CV: Denies chest pain, palpitations, syncope, peripheral edema, and claudication. Resp: Denies dyspnea at rest, cough, wheezing, coughing up blood, and pleurisy. GI: Denies vomiting blood, jaundice, and fecal incontinence.   Denies dysphagia or odynophagia. Derm: Denies rash, itching, dry skin Psych: Denies depression, anxiety, memory loss, confusion. No homicidal or suicidal ideation.  Heme: Denies bruising, bleeding, and enlarged lymph nodes.  Physical Exam: LMP  (LMP Unknown) Comment: pt reports premature menopause General:   Alert and oriented. No distress noted. Pleasant and cooperative.  Head:  Normocephalic and atraumatic. Eyes:  Conjuctiva clear without scleral icterus. Heart:  S1, S2 present without murmurs appreciated. Lungs:  Clear to auscultation bilaterally. No wheezes, rales, or rhonchi. No distress.  Abdomen:  +BS, soft, non-tender and non-distended. No rebound or guarding. No HSM or masses noted. Msk:  Symmetrical without gross deformities. Normal posture. Extremities:  Without edema. Neurologic:  Alert and  oriented x4 Psych:  Normal mood  and affect.    Assessment:     Plan:  ***   Ermalinda Memos, PA-C [facility] Gastroenter[facility] 01/04/2023

## 2023-01-04 ENCOUNTER — Ambulatory Visit (INDEPENDENT_AMBULATORY_CARE_PROVIDER_SITE_OTHER): Payer: Medicare HMO | Admitting: Gastroenterology

## 2023-01-04 ENCOUNTER — Encounter: Payer: Self-pay | Admitting: Gastroenterology

## 2023-01-04 VITALS — BP 165/96 | HR 71 | Temp 98.2°F | Ht 66.0 in | Wt 170.6 lb

## 2023-01-04 DIAGNOSIS — R110 Nausea: Secondary | ICD-10-CM

## 2023-01-04 DIAGNOSIS — K219 Gastro-esophageal reflux disease without esophagitis: Secondary | ICD-10-CM | POA: Diagnosis not present

## 2023-01-04 DIAGNOSIS — K5900 Constipation, unspecified: Secondary | ICD-10-CM | POA: Diagnosis not present

## 2023-01-04 DIAGNOSIS — K3184 Gastroparesis: Secondary | ICD-10-CM | POA: Diagnosis not present

## 2023-01-04 MED ORDER — PANTOPRAZOLE SODIUM 40 MG PO TBEC
40.0000 mg | DELAYED_RELEASE_TABLET | Freq: Two times a day (BID) | ORAL | 3 refills | Status: DC
Start: 2023-01-04 — End: 2023-01-28

## 2023-01-04 NOTE — Patient Instructions (Signed)
I suspect that your mild stomach ache in the morning is secondary to gastroparesis. I recommend that you avoid pork and butter for the next couple weeks and in general follow a low fat/low fiber diet with 4-6 small meals daily. Also recommend that you do not eat within 3 hours of going to bed. If your symptoms continue despite dietary adjustments, you can go ahead and take a second dose of Reglan 2.5 mg at bedtime. Please let me know how you are doing in a couple of weeks.  Otherwise, I recommend that you continue your current medications including: Pantoprazole 40 mg twice daily for GERD Linzess 145 mcg daily for constipation  I will plan to see back in the office in 3 months or sooner if needed.  It was great to see you again today!  I hope you have a fantastic Christmas and new year!  Ermalinda Memos, PA-C [facility] Gastroenterology

## 2023-01-05 DIAGNOSIS — M25612 Stiffness of left shoulder, not elsewhere classified: Secondary | ICD-10-CM | POA: Diagnosis not present

## 2023-01-05 DIAGNOSIS — S42292D Other displaced fracture of upper end of left humerus, subsequent encounter for fracture with routine healing: Secondary | ICD-10-CM | POA: Diagnosis not present

## 2023-01-07 DIAGNOSIS — M25612 Stiffness of left shoulder, not elsewhere classified: Secondary | ICD-10-CM | POA: Diagnosis not present

## 2023-01-07 DIAGNOSIS — S42292D Other displaced fracture of upper end of left humerus, subsequent encounter for fracture with routine healing: Secondary | ICD-10-CM | POA: Diagnosis not present

## 2023-01-11 DIAGNOSIS — S42292D Other displaced fracture of upper end of left humerus, subsequent encounter for fracture with routine healing: Secondary | ICD-10-CM | POA: Diagnosis not present

## 2023-01-11 DIAGNOSIS — M25612 Stiffness of left shoulder, not elsewhere classified: Secondary | ICD-10-CM | POA: Diagnosis not present

## 2023-01-14 DIAGNOSIS — S42292D Other displaced fracture of upper end of left humerus, subsequent encounter for fracture with routine healing: Secondary | ICD-10-CM | POA: Diagnosis not present

## 2023-01-14 DIAGNOSIS — M25612 Stiffness of left shoulder, not elsewhere classified: Secondary | ICD-10-CM | POA: Diagnosis not present

## 2023-01-18 ENCOUNTER — Encounter: Payer: Self-pay | Admitting: Family Medicine

## 2023-01-18 DIAGNOSIS — Z23 Encounter for immunization: Secondary | ICD-10-CM | POA: Insufficient documentation

## 2023-01-18 DIAGNOSIS — F4321 Adjustment disorder with depressed mood: Secondary | ICD-10-CM | POA: Insufficient documentation

## 2023-01-18 NOTE — Assessment & Plan Note (Signed)
Currently controlled on current regime

## 2023-01-18 NOTE — Assessment & Plan Note (Signed)
Controlled no med change 

## 2023-01-18 NOTE — Assessment & Plan Note (Signed)
After obtaining informed consent, the influenza vaccine is  administered , with no adverse effect noted at the time of administration.

## 2023-01-18 NOTE — Assessment & Plan Note (Signed)
Unconrolled not taking med connsistently , counselled re the importance of same DASH diet and commitment to daily physical activity for a minimum of 30 minutes discussed and encouraged, as a part of hypertension management. The importance of attaining a healthy weight is also discussed.     01/04/2023   11:26 AM 01/04/2023   11:20 AM 12/28/2022   10:59 AM 12/28/2022   10:51 AM 12/24/2022   10:00 AM 12/24/2022    9:58 AM 09/29/2022    6:00 PM  BP/Weight  Systolic BP 165 162 150 158 168 178 127  Diastolic BP 96 97 87 81 109 92 81  Wt. (Lbs)  170.6  172  172.12   BMI  27.54 kg/m2  27.76 kg/m2  27.78 kg/m2

## 2023-01-18 NOTE — Assessment & Plan Note (Signed)
Asked:confirms currently smokes cigarettes Assess: Unwilling to set a quit date,currently unable to start  cutting back Advise: needs to QUIT to reduce risk of cancer, cardio and cerebrovascular disease Assist: counseled for 5 minutes and literature provided Arrange: follow up in 2 to 4 months

## 2023-01-18 NOTE — Assessment & Plan Note (Signed)
Teen niece found deceased in Summer 2024 , after being missing for over 6 months, struggling with thisand helping her sister  and the sib;lings to cope with this tragic loss [place] needs therapy and will seek it through her psych office

## 2023-01-18 NOTE — Assessment & Plan Note (Signed)
Hyperlipidemia:Low fat diet discussed and encouraged.   Lipid Panel  Lab Results  Component Value Date   CHOL 117 06/09/2022   HDL 37 (L) 06/09/2022   LDLCALC 52 06/09/2022   TRIG 167 (H) 06/09/2022   CHOLHDL 3.2 06/09/2022     Need stoi inc exercise and reduce fat in the diet

## 2023-01-18 NOTE — Progress Notes (Signed)
Tracey Daniel     MRN: [id number]      DOB: 1980/10/29  Chief Complaint  Patient presents with   Follow-up    Follow up    HPI Tracey Daniel is here for follow up and re-evaluation of chronic medical conditions, medication management and review of any available recent lab and radiology data.  Preventive health is updated, specifically  Cancer screening and Immunization.   Questions or concerns regarding consultations or procedures which the PT has had in the interim are  addressed. The PT denies any adverse reactions to current medications since the last visit.  There are no new concerns.  There are no specific complaints   ROS Denies recent fever or chills. Denies sinus pressure, nasal congestion, ear pain or sore throat. Denies chest congestion, productive cough or wheezing. Denies chest pains, palpitations and leg swelling Denies abdominal pain, nausea, vomiting,diarrhea or constipation.   Denies dysuria, frequency, hesitancy or incontinence. Denies joint pain, swelling and limitation in mobility. Denies headaches, seizures, numbness, or tingling. Denies depression, anxiety or insomnia. Denies skin break down or rash.   PE  BP (!) 168/109 (BP Location: Left Arm, Patient Position: Sitting, Cuff Size: Normal)   Pulse 85   Ht 5\' 6"  (1.676 m)   Wt 172 lb 1.9 oz (78.1 kg)   LMP  (LMP Unknown) Comment: pt reports premature menopause  SpO2 99%   BMI 27.78 kg/m   Patient alert and oriented and in no cardiopulmonary distress.  HEENT: No facial asymmetry, EOMI,     Neck supple .  Chest: Clear to auscultation bilaterally.  CVS: S1, S2 no murmurs, no S3.Regular rate.  ABD: Soft non tender.   Ext: No edema  MS: Adequate ROM spine, shoulders, hips and knees.  Skin: Intact, no ulcerations or rash noted.  Psych: Good eye contact, normal affect. Memory intact not anxious or depressed appearing.  CNS: CN 2-12 intact, power,  normal throughout.no focal deficits  noted.   Assessment & Plan  Essential hypertension Unconrolled not taking med connsistently , counselled re the importance of same DASH diet and commitment to daily physical activity for a minimum of 30 minutes discussed and encouraged, as a part of hypertension management. The importance of attaining a healthy weight is also discussed.     01/04/2023   11:26 AM 01/04/2023   11:20 AM 12/28/2022   10:59 AM 12/28/2022   10:51 AM 12/24/2022   10:00 AM 12/24/2022    9:58 AM 09/29/2022    6:00 PM  BP/Weight  Systolic BP 165 162 150 158 168 178 127  Diastolic BP 96 97 87 81 109 92 81  Wt. (Lbs)  170.6  172  172.12   BMI  27.54 kg/m2  27.76 kg/m2  27.78 kg/m2        Gastroesophageal reflux disease without esophagitis Currently controlled on current regime  Seasonal allergies Controlled no med change  Mixed hyperlipidemia Hyperlipidemia:Low fat diet discussed and encouraged.   Lipid Panel  Lab Results  Component Value Date   CHOL 117 06/09/2022   HDL 37 (L) 06/09/2022   LDLCALC 52 06/09/2022   TRIG 167 (H) 06/09/2022   CHOLHDL 3.2 06/09/2022     Need stoi inc exercise and reduce fat in the diet  Nicotine dependence, cigarettes, with other nicotine-induced disorders Asked:confirms currently smokes cigarettes Assess: Unwilling to set a quit date,currently unable to start  cutting back Advise: needs to QUIT to reduce risk of cancer, cardio and cerebrovascular disease Assist: counseled  for 5 minutes and literature provided Arrange: follow up in 2 to 4 months   Complicated grief Teen niece found deceased in Summer 2024 , after being missing for over 6 months, struggling with thisand helping her sister  and the sib;lings to cope with this tragic loss Tracey Daniel needs therapy and will seek it through her psych office  Encounter for immunization After obtaining informed consent, the influenza vaccine is  administered , with no adverse effect noted at the time of  administration.

## 2023-01-21 DIAGNOSIS — S42292D Other displaced fracture of upper end of left humerus, subsequent encounter for fracture with routine healing: Secondary | ICD-10-CM | POA: Diagnosis not present

## 2023-01-21 DIAGNOSIS — M25612 Stiffness of left shoulder, not elsewhere classified: Secondary | ICD-10-CM | POA: Diagnosis not present

## 2023-01-22 DIAGNOSIS — M7502 Adhesive capsulitis of left shoulder: Secondary | ICD-10-CM | POA: Diagnosis not present

## 2023-01-23 NOTE — Progress Notes (Signed)
 Referring Provider: Antonetta Rollene BRAVO, MD Primary Care Physician:  Antonetta Rollene BRAVO, MD Primary GI Physician: Dr. Shaaron  Chief Complaint  Patient presents with   Abdominal Pain    Pains in the middle of stomach after she eats     HPI:   Tracey Daniel is a 43 y.o. female with history of constipation, GERD, gastroparesis, gastric outlet obstruction secondary to recurrent pylorospasm/pyloric stenosis requiring repeat EGDs with dilation/Botox  injections with last in 2020, presenting today with chief complaint of epigastric pain.   Last seen in the office 01/04/2023.  Constipation controlled on Linzess  145 mcg daily.  GERD well-controlled on pantoprazole  40 mg twice daily.  Reported waking up in the morning with a mild stomach ache, like nausea lasting about half an hour then resolving.  No vomiting.  She is taking Reglan  2.5 mg in the morning.  Reported eating more pork and a lot of butter lately.  Eating around 7 to 8 PM and going to bed around 10.  Queried whether morning GI upset was related to gastroparesis.  Counseled on dietary changes and advised that she may increase Reglan  to 2.5 mg twice a day if symptoms did not improve.   Today: Epigastric pain after every meal. Like a squeezing pain.  Started last 1-2 weeks. Will last hours. Lays down and symptoms eventually resolve until she eats again. Symptoms seem a little better over the last couple days.  Associated nausea. Vomited a couple of times.  Increased Reglan  to 2.5 mg twice a day about 4-5 days ago and this is helping some.  Pain radiates to her back. No specific food triggers.   Denies any new medications.  No heartburn, acid reflux, dysphagia, brbpr, melena. Bowels are moving well.  Currently taking pantoprazole  once daily.  No NSAIDs. Tylenol  only.  Drinks beer. Last drank 3 days ago. Drinks about 4 12 oz cans per day.   Has cut out pork.  Eating turkey and chicken.   Last EGD 06/01/2018: - No gross lesions  in esophagus. Biopsied. - Small hiatal hernia. - Erythematous mucosa in the cardia, gastric fundus and gastric body. - Erosive gastropathy. Biopsied for HP. - Normal pylorus. Dilated. Injected with botulinum toxin. - No gross lesions in the duodenal bulb, in the first portion of the duodenum and in the second portion of the duodenum. Biopsied. -Esophageal biopsy with increased intraepithelial eosinophils, gastric and duodenal biopsies benign.  Past Medical History:  Diagnosis Date   Alopecia    Anemia of other chronic disease 11/29/2012   Asthma    Back pain, chronic    Bronchitis    Chronic abdominal pain    Constipation    COPD (chronic obstructive pulmonary disease) (HCC)    Cyst of skin    mid spine area   Depression 2000   h/o suicidal ideation   Gastric outlet obstruction    Gastritis    Gastroparesis    GERD (gastroesophageal reflux disease)    Hypertension    LGSIL of cervix of undetermined significance 07/08/2020   07/08/20 pt aware will get colpo, per [facility]   Migraine    Migraines    Nausea and vomiting    recurrent   Nicotine  addiction    Osteoporosis    Peptic ulcer disease    Pyloric spasm 03/30/2011   Seasonal allergies    Sinusitis    Substance abuse (HCC) 2008   marijuana   Vaginal Pap smear, abnormal    Vitamin D  deficiency  Past Surgical History:  Procedure Laterality Date   BALLOON DILATION N/A 02/09/2013   Procedure: BALLOON DILATION;  Surgeon: Norleen JAYSON Hint, MD;  Location: [facility];  Service: Endoscopy;  Laterality: N/A;   BALLOON DILATION N/A 03/10/2018   Procedure: BALLOON DILATION;  Surgeon: Shaaron Lamar HERO, MD;  Location: [facility];  Service: Endoscopy;  Laterality: N/A;   BILIARY DILATION  06/01/2018   Procedure: PYLORIC DILATION;  Surgeon: Wilhelmenia Aloha Raddle., MD;  Location: [facility] ENDOSCOPY;  Service: Gastroenterology;;   BIOPSY  06/01/2018   Procedure: BIOPSY;  Surgeon: Wilhelmenia Aloha Raddle., MD;  Location: [facility] ENDOSCOPY;   Service: Gastroenterology;;   BOTOX  INJECTION N/A 02/09/2013   Procedure: BOTOX  INJECTION;  Surgeon: Norleen JAYSON Hint, MD;  Location: [facility];  Service: Endoscopy;  Laterality: N/A;  possible balloon   BOTOX  INJECTION N/A 10/24/2015   Procedure: BOTOX  INJECTION;  Surgeon: Lamar HERO Shaaron, MD;  Location: [facility];  Service: Endoscopy;  Laterality: N/A;   BOTOX  INJECTION N/A 03/10/2018   Procedure: BOTOX  INJECTION;  Surgeon: Shaaron Lamar HERO, MD;  Location: [facility];  Service: Endoscopy;  Laterality: N/A;  Melanie notified   BOTOX  INJECTION  06/01/2018   Procedure: BOTOX  INJECTION;  Surgeon: Wilhelmenia Aloha Raddle., MD;  Location: [facility] ENDOSCOPY;  Service: Gastroenterology;;   CARPAL TUNNEL RELEASE     left hand   COLONOSCOPY WITH PROPOFOL  N/A 09/20/2014   RMR: Normal ileo-colonoscopy   ESOPHAGEAL DILATION  12/25/2010   Procedure: ESOPHAGEAL DILATION;  Surgeon: Margo HERO Haddock, MD;  [facility]: [facility];  Service: Endoscopy;;   ESOPHAGEAL MANOMETRY N/A 10/03/2018   Surgeon: Shila Gustav GAILS, MD;  Normal.   ESOPHAGOGASTRODUODENOSCOPY  12/25/2010   Margo HERO Haddock, MD;  moderate gastritis, ?goo secondary to pylorspasm. BX showed reactive gstropathy no h.pyori, SB mucosa with intramucosal lymphocytosis and partial villous blunting (TTG 4.0 normal)   ESOPHAGOGASTRODUODENOSCOPY N/A 11/14/2012   MFM:Dfjoo hiatal hernia. Abnormal gastric mucosa of  uncertain significance-status post biopsy. Subjectively, patient may have recurrent symptomatic, pylorospasm.   ESOPHAGOGASTRODUODENOSCOPY (EGD) WITH PROPOFOL  N/A 02/09/2013   elongated stomach, partial lower esophageal ring widely patent. No obvious pyloric stenosis s/p Botox    ESOPHAGOGASTRODUODENOSCOPY (EGD) WITH PROPOFOL  N/A 10/24/2015   Procedure: ESOPHAGOGASTRODUODENOSCOPY (EGD) WITH PROPOFOL ;  Surgeon: Lamar HERO Shaaron, MD;  Location: [facility];  Service: Endoscopy;  Laterality: N/A;  830    ESOPHAGOGASTRODUODENOSCOPY (EGD) WITH  PROPOFOL  N/A 03/10/2018   Dr. Shaaron: normal esophagus, elongated, dilated stomach likely stigmata of slow gastric emptying due to narrowing of pyloric channel, s/p balloon dilatation of pyloric channel. Small hiatal hernia, normal duodenal bulb and second portion of duodenum   ESOPHAGOGASTRODUODENOSCOPY (EGD) WITH PROPOFOL  N/A 06/01/2018   Surgeon: Wilhelmenia Aloha Raddle., MD; normal esophagus s/p biopsy, small hiatal hernia, erythematous mucosa in the cardia, gastric fundus, gastric body, erosive gastropathy s/p biopsy, normal pylorus s/p dilation and Botox  injection, normal duodenum s/p biopsy.  Surgical pathology all benign.   LAPAROSCOPIC CHOLECYSTECTOMY  2002   [facility]?  [place]?   laser surgery on cervix     NASAL SEPTUM SURGERY  01/29/2017   ORIF HUMERUS FRACTURE Left 09/29/2022   Procedure: OPEN REDUCTION INTERNAL FIXATION (ORIF) PROXIMAL HUMERUS FRACTURE;  Surgeon: Sherida Adine JAYSON, MD;  Location: [facility] OR;  Service: Orthopedics;  Laterality: Left;   TOE SURGERY      Current Outpatient Medications  Medication Sig Dispense Refill   albuterol  (VENTOLIN  HFA) 108 (90 Base) MCG/ACT inhaler INHALE 2 PUFFS INTO THE LUNGS EVERY 6  HOURS AS NEEDED FOR WHEEZING OR SHORTNESS OF BREATH 6.7 g 0   azelastine  (ASTELIN ) 0.1 % nasal spray USE 2 SPRAYS IN EACH NOSTRIL TWICE DAILY AS DIRECTED 30 mL 2   budesonide -formoterol  (SYMBICORT ) 80-4.5 MCG/ACT inhaler INHALE 2 PUFFS INTO THE LUNGS TWICE DAILY 10.2 g 0   buPROPion  (WELLBUTRIN  SR) 150 MG 12 hr tablet TAKE 1 TABLET(150 MG) BY MOUTH TWICE DAILY 60 tablet 5   calcium -vitamin D  (OSCAL WITH D) 500-5 MG-MCG tablet Take 1 tablet by mouth 2 (two) times daily. 60 tablet 11   carbamide peroxide (DEBROX) 6.5 % OTIC solution Place 5 drops into the right ear 2 (two) times daily. (Patient taking differently: Place 5 drops into the right ear as needed (itching).) 15 mL 0   clobetasol  (OLUX ) 0.05 % topical foam Apply to aa's scalp QD-BID 3 days per week. Avoid  applying to face, groin, and axilla. Use as directed. Long-term use can cause thinning of the skin. 50 g 2   conjugated estrogens  (PREMARIN ) vaginal cream Use 0.5 gm in vaginal 2-3 week 42.5 g 1   denosumab  (PROLIA ) 60 MG/ML SOSY injection Inject 60 mg into the skin every 6 (six) months. 1 mL 3   estradiol  (ESTRACE ) 2 MG tablet Take 1 tablet (2 mg total) by mouth daily. 90 tablet 4   feeding supplement (ENSURE CLINICAL STRENGTH) LIQD Take 237 mLs by mouth 3 (three) times daily with meals. 10 Bottle 3   fenofibrate  (TRICOR ) 48 MG tablet TAKE 1 TABLET(48 MG) BY MOUTH DAILY 30 tablet 5   fluconazole  (DIFLUCAN ) 150 MG tablet Take 1 now and 1 in 3 days, DO NOT TAKE CRESTOR  when taking diflucan  (Patient not taking: Reported on 01/27/2023) 2 tablet 1   fluticasone  (FLONASE ) 50 MCG/ACT nasal spray Place 2 sprays into both nostrils daily. 16 g 6   gabapentin  (NEURONTIN ) 100 MG capsule TAKE 1 CAPSULE(100 MG) BY MOUTH THREE TIMES DAILY 90 capsule 5   linaclotide  (LINZESS ) 145 MCG CAPS capsule Take 1 capsule (145 mcg total) by mouth daily before breakfast. 30 capsule 5   megestrol  (MEGACE ) 40 MG tablet Take 1 tablet (40 mg total) by mouth daily. 30 tablet 11   metoCLOPramide  (REGLAN ) 5 MG tablet TAKE 1/2 TABLET BY MOUTH ONCE DAILY BEFORE A MEAL. MAY TAKE A SECOND DOSE IF NEEDED 90 tablet 0   minoxidil  (LONITEN ) 2.5 MG tablet Take 1 tablet (2.5 mg total) by mouth daily. 90 tablet 1   olmesartan  (BENICAR ) 20 MG tablet TAKE 1 TABLET(20 MG) BY MOUTH DAILY 30 tablet 2   ondansetron  (ZOFRAN -ODT) 4 MG disintegrating tablet DISSOLVE 1 TABLET(4 MG) ON THE TONGUE EVERY 8 HOURS AS NEEDED FOR NAUSEA OR VOMITING 30 tablet 3   pantoprazole  (PROTONIX ) 40 MG tablet Take 1 tablet (40 mg total) by mouth 2 (two) times daily before a meal. (Patient taking differently: Take 40 mg by mouth daily.) 180 tablet 3   pimecrolimus  (ELIDEL ) 1 % cream Apply to eczema of the eyelids BID PRN. 30 g 3   potassium chloride  SA (KLOR-CON  M) 20 MEQ  tablet TAKE 1 TABLET BY MOUTH EVERY DAY 90 tablet 3   prazosin (MINIPRESS) 1 MG capsule Take 1 mg by mouth at bedtime.     progesterone  (PROMETRIUM ) 200 MG capsule TAKE 1 CAPSULE BY MOUTH DAILY AT BEDTIME, DAYS 1-21 OF EACH MONTH 21 capsule 11   propranolol  (INDERAL ) 10 MG tablet Take 1 tablet (10 mg total) by mouth 3 (three) times daily. 90 tablet 2  risperiDONE  (RISPERDAL ) 0.5 MG tablet Take 0.5 mg at bedtime by mouth.     rosuvastatin  (CRESTOR ) 20 MG tablet TAKE 1 TABLET(20 MG) BY MOUTH DAILY 30 tablet 5   sertraline  (ZOLOFT ) 50 MG tablet Take 50 mg by mouth at bedtime.      sucralfate  (CARAFATE ) 1 g tablet TAKE 1 TABLET BY MOUTH  UP TO TWICE DAILY AS NEEDED FOR BURNING IN STOMACH 180 tablet 1   tiZANidine  (ZANAFLEX ) 4 MG tablet TAKE 1 TABLET(4 MG) BY MOUTH TWICE DAILY AS NEEDED FOR BACK SPASMS 180 tablet 1   Vitamin D , Ergocalciferol , (DRISDOL ) 1.25 MG (50000 UNIT) CAPS capsule TAKE 1 CAPSULE BY MOUTH 1 TIME A WEEK 5 capsule 6   zonisamide  (ZONEGRAN ) 50 MG capsule Take 150 mg by mouth at bedtime.   2   minocycline  (MINOCIN ) 50 MG capsule Take 1 capsule (50 mg total) by mouth 2 (two) times daily. (Patient not taking: Reported on 12/28/2022) 60 capsule 2   No current facility-administered medications for this visit.    Allergies as of 01/27/2023 - Review Complete 01/27/2023  Allergen Reaction Noted   Benadryl  [diphenhydramine ] Anaphylaxis 03/25/2014   Penicillins Other (See Comments)    Latex Itching and Other (See Comments) 11/30/2011    Family History  Problem Relation Age of Onset   Lung cancer Mother 44   Cancer Father    Diabetes Father    Liver disease Father        liver [facility] at [facility], age 19   Multiple sclerosis Sister 67   Depression Sister 61   Alcohol abuse Sister    Bipolar disorder Sister    Schizophrenia Sister    Heart disease Maternal Grandmother    Parkinson's disease Maternal Grandfather    Cancer - Colon Neg Hx     Social History   Socioeconomic  History   Marital status: Significant Other    Spouse name: Not on file   Number of children: 0   Years of education: 9   Highest education level: 9th grade  Occupational History   Occupation: disability    Associate Professor: UNEMPLOYED  Tobacco Use   Smoking status: Every Day    Current packs/day: 0.25    Average packs/day: 0.3 packs/day for 15.0 years (3.8 ttl pk-yrs)    Types: Cigarettes   Smokeless tobacco: Never   Tobacco comments:    2 per day  Vaping Use   Vaping status: Never Used  Substance and Sexual Activity   Alcohol use: Yes    Comment: 4, 12 oz beer per day.   Drug use: Yes    Frequency: 2.0 times per week    Types: Marijuana    Comment: once or twice a week   Sexual activity: Yes    Birth control/protection: Post-menopausal  Other Topics Concern   Not on file  Social History Narrative   Not on file   Social Drivers of Health   Financial Resource Strain: Medium Risk (12/28/2022)   Overall Financial Resource Strain (CARDIA)    Difficulty of Paying Living Expenses: Somewhat hard  Food Insecurity: Food Insecurity Present (12/28/2022)   Hunger Vital Sign    Worried About Running Out of Food in the Last Year: Often true    Ran Out of Food in the Last Year: Often true  Transportation Needs: No Transportation Needs (12/28/2022)   PRAPARE - Administrator, Civil Service (Medical): No    Lack of Transportation (Non-Medical): No  Physical Activity: Insufficiently Active (12/28/2022)  Exercise Vital Sign    Days of Exercise per Week: 2 days    Minutes of Exercise per Session: 30 min  Stress: No Stress Concern Present (12/28/2022)   [employer] of Occupational Health - Occupational Stress Questionnaire    Feeling of Stress : Not at all  Social Connections: Moderately Isolated (12/28/2022)   Social Connection and Isolation Panel [NHANES]    Frequency of Communication with Friends and Family: Twice a week    Frequency of Social Gatherings with Friends and  Family: Once a week    Attends Religious Services: Never    Database Administrator or Organizations: No    Attends Engineer, Structural: Never    Marital Status: Living with partner    Review of Systems: Gen: Denies fever, chills, cold or flulike symptoms, presyncope, syncope. CV: Denies chest pain, palpitations. Resp: Denies dyspnea, cough.SABRA GI: See HPI Heme: See HPI  Physical Exam: BP 131/78 (BP Location: Right Arm, Patient Position: Sitting, Cuff Size: Large)   Pulse 79   Temp 97.9 F (36.6 C) (Temporal)   Ht 5' 6 (1.676 m)   Wt 166 lb 6.4 oz (75.5 kg)   LMP  (LMP Unknown) Comment: pt reports premature menopause  BMI 26.86 kg/m  General:   Alert and oriented. No distress noted. Pleasant and cooperative.  Head:  Normocephalic and atraumatic. Eyes:  Conjuctiva clear without scleral icterus. Heart:  S1, S2 present without murmurs appreciated. Lungs:  Clear to auscultation bilaterally. No wheezes, rales, or rhonchi. No distress.  Abdomen:  +BS, soft, non-tender and non-distended. No rebound or guarding. No HSM or masses noted. Msk:  Symmetrical without gross deformities. Normal posture. Extremities:  Without edema. Neurologic:  Alert and  oriented x4 Psych:  Normal mood and affect.    Assessment:  43 y.o. female with history of constipation, GERD, gastroparesis, gastric outlet obstruction secondary to recurrent pylorospasm/pyloric stenosis requiring repeat EGDs with dilation/Botox  injections with last in 2020, presenting today with chief complaint of postprandial epigastric abdominal pain radiating to her back with associated nausea and a couple episodes of vomiting over the last couple weeks. Slight improvement with increasing Reglan  to 2.5 mg twice daily, but symptoms continue. Denies NSAIDs, but she does drink alcohol daily.   In the setting of daily alcohol and pain radiating to her back, query pancreatitis vs PUD, gastritis, duodenitis. Additional considerations  include gastroparesis but this doesn't usually present primarily with pain. Could also have recurrent pyloric stenosis.  Will plan to check labs. If labs are unrevealing, specifically lipase normal, would recommend increasing pantoprazole  to twice daily and proceeding with EGD for further evaluation.   Plan:  CBC, CMP, Lipase.  Continue pantoprazole  40 mg daily for now. Continue Reglan  2.5 mg twice daily. Avoid alcohol for now.  Further recommendations to follow.    Josette Centers, PA-C [facility] 01/27/2023

## 2023-01-27 ENCOUNTER — Ambulatory Visit (INDEPENDENT_AMBULATORY_CARE_PROVIDER_SITE_OTHER): Payer: Medicare HMO | Admitting: Gastroenterology

## 2023-01-27 ENCOUNTER — Encounter: Payer: Self-pay | Admitting: Gastroenterology

## 2023-01-27 VITALS — BP 131/78 | HR 79 | Temp 97.9°F | Ht 66.0 in | Wt 166.4 lb

## 2023-01-27 DIAGNOSIS — R112 Nausea with vomiting, unspecified: Secondary | ICD-10-CM

## 2023-01-27 DIAGNOSIS — F1090 Alcohol use, unspecified, uncomplicated: Secondary | ICD-10-CM

## 2023-01-27 DIAGNOSIS — R1013 Epigastric pain: Secondary | ICD-10-CM | POA: Diagnosis not present

## 2023-01-27 DIAGNOSIS — Z8719 Personal history of other diseases of the digestive system: Secondary | ICD-10-CM

## 2023-01-27 DIAGNOSIS — K3184 Gastroparesis: Secondary | ICD-10-CM

## 2023-01-27 DIAGNOSIS — F333 Major depressive disorder, recurrent, severe with psychotic symptoms: Secondary | ICD-10-CM | POA: Diagnosis not present

## 2023-01-27 NOTE — Patient Instructions (Addendum)
 Please have blood work completed at [facility].  [address], [place], [place] [address]  Continue Reglan  2.5 mg twice daily before meals.  Continue pantoprazole  40 mg daily.  We will call you with your blood work results and further recommendations.  Josette Centers, PA-C [facility]astroenter[facility]

## 2023-01-28 ENCOUNTER — Other Ambulatory Visit: Payer: Self-pay | Admitting: Gastroenterology

## 2023-01-28 ENCOUNTER — Encounter: Payer: Medicare HMO | Admitting: Obstetrics & Gynecology

## 2023-01-28 DIAGNOSIS — K219 Gastro-esophageal reflux disease without esophagitis: Secondary | ICD-10-CM

## 2023-01-28 LAB — CBC WITH DIFFERENTIAL/PLATELET
Absolute Lymphocytes: 2889 {cells}/uL (ref 850–3900)
Absolute Monocytes: 540 {cells}/uL (ref 200–950)
Basophils Absolute: 63 {cells}/uL (ref 0–200)
Basophils Relative: 0.7 %
Eosinophils Absolute: 54 {cells}/uL (ref 15–500)
Eosinophils Relative: 0.6 %
HCT: 35 % (ref 35.0–45.0)
Hemoglobin: 11.8 g/dL (ref 11.7–15.5)
MCH: 31.8 pg (ref 27.0–33.0)
MCHC: 33.7 g/dL (ref 32.0–36.0)
MCV: 94.3 fL (ref 80.0–100.0)
MPV: 10.5 fL (ref 7.5–12.5)
Monocytes Relative: 6 %
Neutro Abs: 5454 {cells}/uL (ref 1500–7800)
Neutrophils Relative %: 60.6 %
Platelets: 261 10*3/uL (ref 140–400)
RBC: 3.71 10*6/uL — ABNORMAL LOW (ref 3.80–5.10)
RDW: 11.8 % (ref 11.0–15.0)
Total Lymphocyte: 32.1 %
WBC: 9 10*3/uL (ref 3.8–10.8)

## 2023-01-28 LAB — COMPLETE METABOLIC PANEL WITH GFR
AG Ratio: 1.7 (calc) (ref 1.0–2.5)
ALT: 14 U/L (ref 6–29)
AST: 17 U/L (ref 10–30)
Albumin: 4.8 g/dL (ref 3.6–5.1)
Alkaline phosphatase (APISO): 46 U/L (ref 31–125)
BUN: 19 mg/dL (ref 7–25)
CO2: 23 mmol/L (ref 20–32)
Calcium: 10.2 mg/dL (ref 8.6–10.2)
Chloride: 100 mmol/L (ref 98–110)
Creat: 0.98 mg/dL (ref 0.50–0.99)
Globulin: 2.8 g/dL (ref 1.9–3.7)
Glucose, Bld: 90 mg/dL (ref 65–139)
Potassium: 4.4 mmol/L (ref 3.5–5.3)
Sodium: 136 mmol/L (ref 135–146)
Total Bilirubin: 0.2 mg/dL (ref 0.2–1.2)
Total Protein: 7.6 g/dL (ref 6.1–8.1)
eGFR: 73 mL/min/{1.73_m2} (ref >=60)

## 2023-01-28 LAB — LIPASE: Lipase: 25 U/L (ref 7–60)

## 2023-01-28 MED ORDER — PANTOPRAZOLE SODIUM 40 MG PO TBEC: 40.0000 mg | DELAYED_RELEASE_TABLET | Freq: Two times a day (BID) | ORAL | 1 refills | Status: AC

## 2023-02-03 DIAGNOSIS — M25612 Stiffness of left shoulder, not elsewhere classified: Secondary | ICD-10-CM | POA: Diagnosis not present

## 2023-02-03 DIAGNOSIS — S42292D Other displaced fracture of upper end of left humerus, subsequent encounter for fracture with routine healing: Secondary | ICD-10-CM | POA: Diagnosis not present

## 2023-02-04 ENCOUNTER — Encounter (HOSPITAL_COMMUNITY): Payer: Self-pay | Admitting: Oncology

## 2023-02-05 DIAGNOSIS — M25612 Stiffness of left shoulder, not elsewhere classified: Secondary | ICD-10-CM | POA: Diagnosis not present

## 2023-02-05 DIAGNOSIS — S42292D Other displaced fracture of upper end of left humerus, subsequent encounter for fracture with routine healing: Secondary | ICD-10-CM | POA: Diagnosis not present

## 2023-02-06 ENCOUNTER — Other Ambulatory Visit: Payer: Self-pay | Admitting: Family Medicine

## 2023-02-09 DIAGNOSIS — M25612 Stiffness of left shoulder, not elsewhere classified: Secondary | ICD-10-CM | POA: Diagnosis not present

## 2023-02-09 DIAGNOSIS — S42292D Other displaced fracture of upper end of left humerus, subsequent encounter for fracture with routine healing: Secondary | ICD-10-CM | POA: Diagnosis not present

## 2023-02-11 ENCOUNTER — Ambulatory Visit: Payer: Medicare HMO | Admitting: Family Medicine

## 2023-02-11 DIAGNOSIS — S42292D Other displaced fracture of upper end of left humerus, subsequent encounter for fracture with routine healing: Secondary | ICD-10-CM | POA: Diagnosis not present

## 2023-02-11 DIAGNOSIS — M25612 Stiffness of left shoulder, not elsewhere classified: Secondary | ICD-10-CM | POA: Diagnosis not present

## 2023-02-15 ENCOUNTER — Encounter: Payer: Medicare HMO | Admitting: Obstetrics & Gynecology

## 2023-02-16 DIAGNOSIS — M25612 Stiffness of left shoulder, not elsewhere classified: Secondary | ICD-10-CM | POA: Diagnosis not present

## 2023-02-16 DIAGNOSIS — S42292D Other displaced fracture of upper end of left humerus, subsequent encounter for fracture with routine healing: Secondary | ICD-10-CM | POA: Diagnosis not present

## 2023-02-17 ENCOUNTER — Other Ambulatory Visit: Payer: Self-pay | Admitting: Family Medicine

## 2023-02-17 ENCOUNTER — Other Ambulatory Visit: Payer: Self-pay | Admitting: Gastroenterology

## 2023-02-17 DIAGNOSIS — K3184 Gastroparesis: Secondary | ICD-10-CM

## 2023-02-18 DIAGNOSIS — G518 Other disorders of facial nerve: Secondary | ICD-10-CM | POA: Diagnosis not present

## 2023-02-18 DIAGNOSIS — M542 Cervicalgia: Secondary | ICD-10-CM | POA: Diagnosis not present

## 2023-02-18 DIAGNOSIS — M791 Myalgia, unspecified site: Secondary | ICD-10-CM | POA: Diagnosis not present

## 2023-02-18 DIAGNOSIS — G43719 Chronic migraine without aura, intractable, without status migrainosus: Secondary | ICD-10-CM | POA: Diagnosis not present

## 2023-02-20 ENCOUNTER — Other Ambulatory Visit: Payer: Self-pay | Admitting: Family Medicine

## 2023-02-23 DIAGNOSIS — M7502 Adhesive capsulitis of left shoulder: Secondary | ICD-10-CM | POA: Diagnosis not present

## 2023-02-24 DIAGNOSIS — S42292D Other displaced fracture of upper end of left humerus, subsequent encounter for fracture with routine healing: Secondary | ICD-10-CM | POA: Diagnosis not present

## 2023-02-24 DIAGNOSIS — M25612 Stiffness of left shoulder, not elsewhere classified: Secondary | ICD-10-CM | POA: Diagnosis not present

## 2023-03-03 ENCOUNTER — Encounter (HOSPITAL_COMMUNITY): Payer: Self-pay | Admitting: Oncology

## 2023-03-03 DIAGNOSIS — M25612 Stiffness of left shoulder, not elsewhere classified: Secondary | ICD-10-CM | POA: Diagnosis not present

## 2023-03-03 DIAGNOSIS — S42292D Other displaced fracture of upper end of left humerus, subsequent encounter for fracture with routine healing: Secondary | ICD-10-CM | POA: Diagnosis not present

## 2023-03-10 ENCOUNTER — Ambulatory Visit (INDEPENDENT_AMBULATORY_CARE_PROVIDER_SITE_OTHER): Payer: Medicare HMO

## 2023-03-10 ENCOUNTER — Ambulatory Visit: Payer: Medicare HMO | Admitting: Gastroenterology

## 2023-03-10 VITALS — Ht 66.0 in | Wt 162.0 lb

## 2023-03-10 DIAGNOSIS — Z599 Problem related to housing and economic circumstances, unspecified: Secondary | ICD-10-CM | POA: Diagnosis not present

## 2023-03-10 DIAGNOSIS — Z0000 Encounter for general adult medical examination without abnormal findings: Secondary | ICD-10-CM

## 2023-03-10 DIAGNOSIS — Z5941 Food insecurity: Secondary | ICD-10-CM | POA: Diagnosis not present

## 2023-03-10 NOTE — Progress Notes (Signed)
 Please attest and cosign this visit due to patients primary care provider not being in the office at the time the visit was completed.   Because this visit was a virtual/telehealth visit,  certain criteria was not obtained, such a blood pressure, CBG if applicable, and timed get up and go. Any medications not marked as "taking" were not mentioned during the medication reconciliation part of the visit. Any vitals not documented were not able to be obtained due to this being a telehealth visit or patient was unable to self-report a recent blood pressure reading due to a lack of equipment at home via telehealth. Vitals that have been documented are verbally provided by the patient.  Interactive audio and video telecommunications were attempted between this provider and patient, however failed, due to patient having technical difficulties OR patient did not have access to video capability.  We continued and completed visit with audio only.  Subjective:   Tracey Daniel is a 43 y.o. female who presents for Medicare Annual (Subsequent) preventive examination.  Visit Complete: Virtual I connected with  [name] on 03/10/23 by a audio enabled telemedicine application and verified that I am speaking with the correct person using two identifiers.  Patient Location: Home  Provider Location: Home Office  I discussed the limitations of evaluation and management by telemedicine. The patient expressed understanding and agreed to proceed.  Vital Signs: Because this visit was a virtual/telehealth visit, some criteria may be missing or patient reported. Any vitals not documented were not able to be obtained and vitals that have been documented are patient reported.  Cardiac Risk Factors include: advanced age (>22men, >27 women);sedentary lifestyle;smoking/ tobacco exposure     Objective:    Today's Vitals   03/10/23 0803 03/10/23 0806  Weight: 162 lb (73.5 kg)   Height: 5\' 6"  (1.676 m)    PainSc:  7    Body mass index is 26.15 kg/m.     03/10/2023    8:02 AM 09/29/2022   10:22 AM 09/19/2022    7:08 PM 07/12/2022   11:24 AM 03/02/2022    8:14 AM 07/20/2021    9:30 AM 11/22/2020    9:09 AM  Advanced Directives  Does Patient Have a Medical Advance Directive? No No No No No No No  Would patient like information on creating a medical advance directive? No - Patient declined No - Patient declined   Yes (ED - Information included in AVS)  No - Patient declined    Current Medications (verified) Outpatient Encounter Medications as of 03/10/2023  Medication Sig   albuterol (VENTOLIN HFA) 108 (90 Base) MCG/ACT inhaler INHALE 2 PUFFS INTO THE LUNGS EVERY 6 HOURS AS NEEDED FOR WHEEZING OR SHORTNESS OF BREATH   azelastine (ASTELIN) 0.1 % nasal spray USE 2 SPRAYS IN EACH NOSTRIL TWICE DAILY AS DIRECTED   budesonide-formoterol (SYMBICORT) 80-4.5 MCG/ACT inhaler INHALE 2 PUFFS INTO THE LUNGS TWICE DAILY   buPROPion (WELLBUTRIN SR) 150 MG 12 hr tablet TAKE 1 TABLET(150 MG) BY MOUTH TWICE DAILY   calcium-vitamin D (OSCAL WITH D) 500-5 MG-MCG tablet Take 1 tablet by mouth 2 (two) times daily.   carbamide peroxide (DEBROX) 6.5 % OTIC solution Place 5 drops into the right ear 2 (two) times daily. (Patient taking differently: Place 5 drops into the right ear as needed (itching).)   clobetasol (OLUX) 0.05 % topical foam Apply to aa's scalp QD-BID 3 days per week. Avoid applying to face, groin, and axilla. Use as directed. Long-term use  can cause thinning of the skin.   conjugated estrogens (PREMARIN) vaginal cream Use 0.5 gm in vaginal 2-3 week   estradiol (ESTRACE) 2 MG tablet Take 1 tablet (2 mg total) by mouth daily.   feeding supplement (ENSURE CLINICAL STRENGTH) LIQD Take 237 mLs by mouth 3 (three) times daily with meals.   fenofibrate (TRICOR) 48 MG tablet TAKE 1 TABLET(48 MG) BY MOUTH DAILY   fluconazole (DIFLUCAN) 150 MG tablet Take 1 now and 1 in 3 days, DO NOT TAKE CRESTOR when taking  diflucan   fluticasone (FLONASE) 50 MCG/ACT nasal spray Place 2 sprays into both nostrils daily.   gabapentin (NEURONTIN) 100 MG capsule TAKE 1 CAPSULE(100 MG) BY MOUTH THREE TIMES DAILY   linaclotide (LINZESS) 145 MCG CAPS capsule Take 1 capsule (145 mcg total) by mouth daily before breakfast.   megestrol (MEGACE) 40 MG tablet Take 1 tablet (40 mg total) by mouth daily.   metoCLOPramide (REGLAN) 5 MG tablet TAKE 1/2 TABLET BY MOUTH ONCE DAILY BEFORE A MEAL. MAY TAKE A SECOND DOSE IF NEEDED   minocycline (MINOCIN) 50 MG capsule Take 1 capsule (50 mg total) by mouth 2 (two) times daily.   minoxidil (LONITEN) 2.5 MG tablet Take 1 tablet (2.5 mg total) by mouth daily.   olmesartan (BENICAR) 20 MG tablet TAKE 1 TABLET(20 MG) BY MOUTH DAILY   ondansetron (ZOFRAN-ODT) 4 MG disintegrating tablet DISSOLVE 1 TABLET(4 MG) ON THE TONGUE EVERY 8 HOURS AS NEEDED FOR NAUSEA OR VOMITING   pantoprazole (PROTONIX) 40 MG tablet Take 1 tablet (40 mg total) by mouth 2 (two) times daily before a meal.   pimecrolimus (ELIDEL) 1 % cream Apply to eczema of the eyelids BID PRN.   potassium chloride SA (KLOR-CON M) 20 MEQ tablet TAKE 1 TABLET BY MOUTH EVERY DAY   prazosin (MINIPRESS) 1 MG capsule Take 1 mg by mouth at bedtime.   progesterone (PROMETRIUM) 200 MG capsule TAKE 1 CAPSULE BY MOUTH DAILY AT BEDTIME, DAYS 1-21 OF EACH MONTH   PROLIA 60 MG/ML SOSY injection INJECT 60 MG UNDER THE SKIN EVERY 6 MONTHS   propranolol (INDERAL) 10 MG tablet Take 1 tablet (10 mg total) by mouth 3 (three) times daily.   risperiDONE (RISPERDAL) 0.5 MG tablet Take 0.5 mg at bedtime by mouth.   rosuvastatin (CRESTOR) 20 MG tablet TAKE 1 TABLET(20 MG) BY MOUTH DAILY   sertraline (ZOLOFT) 50 MG tablet Take 50 mg by mouth at bedtime.    sucralfate (CARAFATE) 1 g tablet TAKE 1 TABLET BY MOUTH  UP TO TWICE DAILY AS NEEDED FOR BURNING IN STOMACH   tiZANidine (ZANAFLEX) 4 MG tablet TAKE 1 TABLET(4 MG) BY MOUTH TWICE DAILY AS NEEDED FOR BACK  SPASMS   Vitamin D, Ergocalciferol, (DRISDOL) 1.25 MG (50000 UNIT) CAPS capsule TAKE 1 CAPSULE BY MOUTH 1 TIME A WEEK   zonisamide (ZONEGRAN) 50 MG capsule Take 150 mg by mouth at bedtime.    No facility-administered encounter medications on file as of 03/10/2023.    Allergies (verified) Benadryl [diphenhydramine], Penicillins, and Latex   History: Past Medical History:  Diagnosis Date   Alopecia    Anemia of other chronic disease 11/29/2012   Asthma    Back pain, chronic    Bronchitis    Chronic abdominal pain    Constipation    COPD (chronic obstructive pulmonary disease) (HCC)    Cyst of skin    mid spine area   Depression 2000   h/o suicidal ideation   Gastric outlet  obstruction    Gastritis    Gastroparesis    GERD (gastroesophageal reflux disease)    Hypertension    LGSIL of cervix of undetermined significance 07/08/2020   07/08/20 pt aware will get colpo, per [facility]   Migraine    Migraines    Nausea and vomiting    recurrent   Nicotine addiction    Osteoporosis    Peptic ulcer disease    Pyloric spasm 03/30/2011   Seasonal allergies    Sinusitis    Substance abuse (HCC) 2008   marijuana   Vaginal Pap smear, abnormal    Vitamin D deficiency    Past Surgical History:  Procedure Laterality Date   BALLOON DILATION N/A 02/09/2013   Procedure: BALLOON DILATION;  Surgeon: Barrie Folk, MD;  Location: [facility] ENDOSCOPY;  Service: Endoscopy;  Laterality: N/A;   BALLOON DILATION N/A 03/10/2018   Procedure: BALLOON DILATION;  Surgeon: Corbin Ade, MD;  Location: [facility];  Service: Endoscopy;  Laterality: N/A;   BILIARY DILATION  06/01/2018   Procedure: PYLORIC DILATION;  Surgeon: Meridee Score Netty Starring., MD;  Location: [facility] ENDOSCOPY;  Service: Gastroenterology;;   BIOPSY  06/01/2018   Procedure: BIOPSY;  Surgeon: Lemar Lofty., MD;  Location: [facility] ENDOSCOPY;  Service: Gastroenterology;;   BOTOX INJECTION N/A 02/09/2013   Procedure: BOTOX INJECTION;   Surgeon: Barrie Folk, MD;  Location: [facility] ENDOSCOPY;  Service: Endoscopy;  Laterality: N/A;  possible balloon   BOTOX INJECTION N/A 10/24/2015   Procedure: BOTOX INJECTION;  Surgeon: Corbin Ade, MD;  Location: [facility];  Service: Endoscopy;  Laterality: N/A;   BOTOX INJECTION N/A 03/10/2018   Procedure: BOTOX INJECTION;  Surgeon: Corbin Ade, MD;  Location: [facility];  Service: Endoscopy;  Laterality: N/A;  Melanie notified   BOTOX INJECTION  06/01/2018   Procedure: BOTOX INJECTION;  Surgeon: Meridee Score Netty Starring., MD;  Location: [facility] ENDOSCOPY;  Service: Gastroenterology;;   CARPAL TUNNEL RELEASE     left hand   COLONOSCOPY WITH PROPOFOL N/A 09/20/2014   RMR: Normal ileo-colonoscopy   ESOPHAGEAL DILATION  12/25/2010   Procedure: ESOPHAGEAL DILATION;  Surgeon: [name], MD;  [facility]: [facility];  Service: Endoscopy;;   ESOPHAGEAL MANOMETRY N/A 10/03/2018   Surgeon: Napoleon Form, MD;  Normal.   ESOPHAGOGASTRODUODENOSCOPY  12/25/2010   [name], MD;  moderate gastritis, ?goo secondary to pylorspasm. BX showed reactive gstropathy no h.pyori, SB mucosa with intramucosal lymphocytosis and partial villous blunting (TTG 4.0 normal)   ESOPHAGOGASTRODUODENOSCOPY N/A 11/14/2012   XBJ:YNWGN hiatal hernia. Abnormal gastric mucosa of  uncertain significance-status post biopsy. Subjectively, patient may have recurrent symptomatic, pylorospasm.   ESOPHAGOGASTRODUODENOSCOPY (EGD) WITH PROPOFOL N/A 02/09/2013   elongated stomach, partial lower esophageal ring widely patent. No obvious pyloric stenosis s/p Botox   ESOPHAGOGASTRODUODENOSCOPY (EGD) WITH PROPOFOL N/A 10/24/2015   Procedure: ESOPHAGOGASTRODUODENOSCOPY (EGD) WITH PROPOFOL;  Surgeon: Corbin Ade, MD;  [facility]: [facility];  Service: Endoscopy;  Laterality: N/A;  830    ESOPHAGOGASTRODUODENOSCOPY (EGD) WITH PROPOFOL N/A 03/10/2018   Dr. Jena Gauss: normal esophagus, elongated, dilated stomach likely stigmata  of slow gastric emptying due to narrowing of pyloric channel, s/p balloon dilatation of pyloric channel. Small hiatal hernia, normal duodenal bulb and second portion of duodenum   ESOPHAGOGASTRODUODENOSCOPY (EGD) WITH PROPOFOL N/A 06/01/2018   Surgeon: Lemar Lofty., MD; normal esophagus s/p biopsy, small hiatal hernia, erythematous mucosa in the cardia, gastric fundus, gastric body, erosive gastropathy s/p biopsy, normal pylorus  s/p dilation and Botox injection, normal duodenum s/p biopsy.  Surgical pathology all benign.   LAPAROSCOPIC CHOLECYSTECTOMY  2002   [facility]?  [place]?   laser surgery on cervix     NASAL SEPTUM SURGERY  01/29/2017   ORIF HUMERUS FRACTURE Left 09/29/2022   Procedure: OPEN REDUCTION INTERNAL FIXATION (ORIF) PROXIMAL HUMERUS FRACTURE;  Surgeon: Luci Bank, MD;  Location: [facility] OR;  Service: Orthopedics;  Laterality: Left;   TOE SURGERY     Family History  Problem Relation Age of Onset   Lung cancer Mother 21   Cancer Father    Diabetes Father    Liver disease Father        liver transplant at [facility], age 79   Multiple sclerosis Sister 50   Depression Sister 60   Alcohol abuse Sister    Bipolar disorder Sister    Schizophrenia Sister    Heart disease Maternal Grandmother    Parkinson's disease Maternal Grandfather    Cancer - Colon Neg Hx    Social History   Socioeconomic History   Marital status: Significant Other    Spouse name: Not on file   Number of children: 0   Years of education: 9   Highest education level: 9th grade  Occupational History   Occupation: disability    Associate Professor: UNEMPLOYED  Tobacco Use   Smoking status: Every Day    Current packs/day: 0.25    Average packs/day: 0.3 packs/day for 15.0 years (3.8 ttl pk-yrs)    Types: Cigarettes   Smokeless tobacco: Never   Tobacco comments:    2 per day  Vaping Use   Vaping status: Never Used  Substance and Sexual Activity   Alcohol use: Yes    Comment: 4, 12 oz beer per day.    Drug use: Yes    Frequency: 2.0 times per week    Types: Marijuana    Comment: once or twice a week   Sexual activity: Yes    Birth control/protection: Post-menopausal  Other Topics Concern   Not on file  Social History Narrative   Not on file   Social Drivers of Health   Financial Resource Strain: Medium Risk (03/10/2023)   Overall Financial Resource Strain (CARDIA)    Difficulty of Paying Living Expenses: Somewhat hard  Food Insecurity: Food Insecurity Present (03/10/2023)   Hunger Vital Sign    Worried About Running Out of Food in the Last Year: Often true    Ran Out of Food in the Last Year: Often true  Transportation Needs: No Transportation Needs (03/10/2023)   PRAPARE - Administrator, Civil Service (Medical): No    Lack of Transportation (Non-Medical): No  Physical Activity: Insufficiently Active (03/10/2023)   Exercise Vital Sign    Days of Exercise per Week: 2 days    Minutes of Exercise per Session: 30 min  Stress: No Stress Concern Present (03/10/2023)   [employer] of Occupational Health - Occupational Stress Questionnaire    Feeling of Stress : Not at all  Social Connections: Moderately Isolated (03/10/2023)   Social Connection and Isolation Panel [NHANES]    Frequency of Communication with Friends and Family: Three times a week    Frequency of Social Gatherings with Friends and Family: Twice a week    Attends Religious Services: Never    Database administrator or Organizations: No    Attends Banker Meetings: Never    Marital Status: Living with partner    Tobacco  Counseling Ready to quit: Yes Counseling given: Yes Tobacco comments: 2 per day   Clinical Intake:  Pre-visit preparation completed: Yes  Pain : 0-10 Pain Score: 7  Pain Type: Chronic pain Pain Location: Shoulder Pain Orientation: Left Pain Descriptors / Indicators: Constant Pain Onset: More than a month ago Pain Frequency: Constant     BMI - recorded:  26.15 Nutritional Risks: None Diabetes: No  How often do you need to have someone help you when you read instructions, pamphlets, or other written materials from your doctor or pharmacy?: 1 - Never  Interpreter Needed?: No  Information entered by :: Maryjean Ka CMA   Activities of Daily Living    03/10/2023    8:16 AM 09/29/2022   10:33 AM  In your present state of health, do you have any difficulty performing the following activities:  Hearing? 0 0  Vision? 0 0  Difficulty concentrating or making decisions? 0 0  Walking or climbing stairs? 0 0  Dressing or bathing? 1 0  Comment has difficulty right now due to a shoulder injury   Doing errands, shopping? 0   Preparing Food and eating ? N   Using the Toilet? N   In the past six months, have you accidently leaked urine? N   Do you have problems with loss of bowel control? N   Managing your Medications? N   Managing your Finances? N   Housekeeping or managing your Housekeeping? N     Patient Care Team: Kerri Perches, MD as PCP - General (Family Medicine) Jena Gauss Gerrit Friends, MD (Gastroenterology) Beverly Gust, MD as Referring Physician (Gastroenterology) Mansouraty, Netty Starring., MD as Consulting Physician (Gastroenterology)  Indicate any recent Medical Services you may have received from other than [facility] providers in the past year (date may be approximate).     Assessment:   This is a routine wellness examination for Roxann.  Hearing/Vision screen Hearing Screening - Comments:: Patient denies any hearing difficulties.   Vision Screening - Comments:: Wears rx glasses - up to date with routine eye exams  Sees [facility]    Goals Addressed             This Visit's Progress    Patient Stated   On track    Stop smoking       Depression Screen    03/10/2023    8:23 AM 12/28/2022   11:03 AM 12/24/2022   10:34 AM 12/24/2022   10:00 AM 11/06/2022    1:55 PM 08/13/2022   11:06 AM 08/13/2022   10:20 AM   PHQ 2/9 Scores  PHQ - 2 Score 0 0 5 2 1 1 1   PHQ- 9 Score 0 3 12 4 5       Fall Risk    03/10/2023    8:16 AM 12/28/2022   10:58 AM 12/24/2022    9:59 AM 08/13/2022   10:20 AM 06/09/2022   10:38 AM  Fall Risk   Falls in the past year? 0 1 1 0 0  Number falls in past yr: 0 0 0 0 0  Injury with Fall? 0 1 1 0 0  Comment  dislocated shoulder, broke bones in shoulder     Risk for fall due to : No Fall Risks  History of fall(s) No Fall Risks No Fall Risks  Follow up Falls prevention discussed;Falls evaluation completed  Falls evaluation completed Falls evaluation completed Falls evaluation completed    MEDICARE RISK AT HOME: Medicare Risk at  Home Any stairs in or around the home?: No If so, are there any without handrails?: No Home free of loose throw rugs in walkways, pet beds, electrical cords, etc?: Yes Adequate lighting in your home to reduce risk of falls?: Yes Life alert?: No Use of a cane, walker or w/c?: No Grab bars in the bathroom?: No Shower chair or bench in shower?: No Elevated toilet seat or a handicapped toilet?: No  TIMED UP AND GO:  Was the test performed?  No    Cognitive Function:    03/02/2022    8:15 AM 11/22/2020    9:11 AM  MMSE - Mini Mental State Exam  Not completed: Unable to complete Unable to complete        03/10/2023    8:15 AM 03/02/2022    8:15 AM 11/22/2020    9:11 AM 11/13/2019    9:28 AM 11/11/2018    8:16 AM  6CIT Screen  What Year? 0 points 0 points 0 points 0 points 0 points  What month? 0 points 0 points 0 points 0 points 0 points  What time? 0 points 0 points 0 points 0 points 0 points  Count back from 20 0 points 0 points 0 points 0 points 0 points  Months in reverse 0 points 0 points 4 points 0 points 4 points  Repeat phrase 0 points 0 points 10 points  2 points  Total Score 0 points 0 points 14 points  6 points    Immunizations Immunization History  Administered Date(s) Administered   Influenza Split 10/30/2010, 10/09/2011    Influenza, Seasonal, Injecte, Preservative Fre 12/24/2022   Influenza,inj,Quad PF,6+ Mos 11/14/2012, 12/13/2013, 09/18/2014, 10/02/2015, 11/23/2016, 11/08/2017, 09/08/2018, 10/05/2019, 11/12/2020, 12/02/2021   Moderna Sars-Covid-2 Vaccination 05/23/2019, 06/23/2019, 12/24/2019   PNEUMOCOCCAL CONJUGATE-20 11/14/2020   Pneumococcal Polysaccharide-23 08/21/2010   Td 06/20/2001   Tdap 08/21/2010    TDAP status: Due, Education has been provided regarding the importance of this vaccine. Advised may receive this vaccine at local pharmacy or Health Dept. Aware to provide a copy of the vaccination record if obtained from local pharmacy or Health Dept. Verbalized acceptance and understanding.  Flu Vaccine status: Up to date  Pneumococcal vaccine status: Up to date  Covid-19 vaccine status: Information provided on how to obtain vaccines.   Qualifies for Shingles Vaccine? No  No age appropriate for this patient.   Screening Tests Health Maintenance  Topic Date Due   DTaP/Tdap/Td (3 - Td or Tdap) 08/20/2020   COVID-19 Vaccine (4 - 2024-25 season) 09/20/2022   Medicare Annual Wellness (AWV)  03/03/2023   MAMMOGRAM  08/03/2023   Colonoscopy  09/19/2024   Cervical Cancer Screening (HPV/Pap Cotest)  12/28/2027   Pneumococcal Vaccine 28-28 Years old  Completed   INFLUENZA VACCINE  Completed   Hepatitis C Screening  Completed   HIV Screening  Completed   HPV VACCINES  Aged Out    Health Maintenance  Health Maintenance Due  Topic Date Due   DTaP/Tdap/Td (3 - Td or Tdap) 08/20/2020   COVID-19 Vaccine (4 - 2024-25 season) 09/20/2022   Medicare Annual Wellness (AWV)  03/03/2023    Colorectal cancer screening: Type of screening: Colonoscopy. Completed 09/20/2014. Repeat every 10 years  Mammogram status: Completed 08/03/2022. Repeat every year  Bone Density Screening: Not age appropriate for this patient.   Lung Cancer Screening: (Low Dose CT Chest recommended if Age 55-80 years, 20  pack-year currently smoking OR have quit w/in 15years.) does not qualify.   Lung  Cancer Screening Referral: na  Additional Screening:  Hepatitis C Screening: does not qualify; Completed   Vision Screening: Recommended annual ophthalmology exams for early detection of glaucoma and other disorders of the eye. Is the patient up to date with their annual eye exam?  Yes  Who is the provider or what is the name of the office in which the patient attends annual eye exams? [facility]  Dental Screening: Recommended annual dental exams for proper oral hygiene  Diabetic Foot Exam: na  Community Resource Referral / Chronic Care Management: CRR required this visit?  Yes   CCM required this visit?  No     Plan:     I have personally reviewed and noted the following in the patient's chart:   Medical and social history Use of alcohol, tobacco or illicit drugs  Current medications and supplements including opioid prescriptions. Patient is not currently taking opioid prescriptions. Functional ability and status Nutritional status Physical activity Advanced directives List of other physicians Hospitalizations, surgeries, and ER visits in previous 12 months Vitals Screenings to include cognitive, depression, and falls Referrals and appointments  In addition, I have reviewed and discussed with patient certain preventive protocols, quality metrics, and best practice recommendations. A written personalized care plan for preventive services as well as general preventive health recommendations were provided to patient.     Jordan Hawks Ethelreda Sukhu, CMA   03/10/2023   After Visit Summary: (MyChart) Due to this being a telephonic visit, the after visit summary with patients personalized plan was offered to patient via MyChart   Nurse Notes: see routing comment

## 2023-03-10 NOTE — Patient Instructions (Signed)
 Ms. Halder , Thank you for taking time to come for your Medicare Wellness Visit. I appreciate your ongoing commitment to your health goals. Please review the following plan we discussed and let me know if I can assist you in the future.   Referrals/Orders/Follow-Ups/Clinician Recommendations:  Next Medicare Annual Wellness Visit:   March 13, 2024 at 4:10 pm virtual visit  A referral has been placed for you to see if there are any additional resources to help you with one of the following:     []   Transportation Needs   []   Utility Needs   [x]   Food Insecurity   []  Assistance with daily activities such as bath, dressing, and managing your medications   []  Housing Insecurity   []  Assistance with your medications  If you haven't heard from anyone within the next 7 business days, please call them and let them know a referral has been placed  Concierge Line: [phone]   This is a list of the screening recommended for you and due dates:  Health Maintenance  Topic Date Due   DTaP/Tdap/Td vaccine (3 - Td or Tdap) 08/20/2020   COVID-19 Vaccine (4 - 2024-25 season) 09/20/2022   Mammogram  08/03/2023   Medicare Annual Wellness Visit  03/09/2024   Colon Cancer Screening  09/19/2024   Pap with HPV screening  12/28/2027   Pneumococcal Vaccination  Completed   Flu Shot  Completed   Hepatitis C Screening  Completed   HIV Screening  Completed   HPV Vaccine  Aged Out    Advanced directives: (Declined) Advance directive discussed with you today. Even though you declined this today, please call our office should you change your mind, and we can give you the proper paperwork for you to fill out.  Next Medicare Annual Wellness Visit scheduled for next year: yes  Understanding Your Risk for Falls Millions of people have serious injuries from falls each year. It is important to understand your risk of falling. Talk with your health care provider about your risk and what you can do to lower  it. If you do have a serious fall, make sure to tell your provider. Falling once raises your risk of falling again. How can falls affect me? Serious injuries from falls are common. These include: Broken bones, such as hip fractures. Head injuries, such as traumatic brain injuries (TBI) or concussions. A fear of falling can cause you to avoid activities and stay at home. This can make your muscles weaker and raise your risk for a fall. What can increase my risk? There are a number of risk factors that increase your risk for falling. The more risk factors you have, the higher your risk of falling. Serious injuries from a fall happen most often to people who are older than 45 years old. Teenagers and young adults ages 60-29 are also at higher risk. Common risk factors include: Weakness in the lower body. Being generally weak or confused due to long-term (chronic) illness. Dizziness or balance problems. Poor vision. Medicines that cause dizziness or drowsiness. These may include: Medicines for your blood pressure, heart, anxiety, insomnia, or swelling (edema). Pain medicines. Muscle relaxants. Other risk factors include: Drinking alcohol. Having had a fall in the past. Having foot pain or wearing improper footwear. Working at a dangerous job. Having any of the following in your home: Tripping hazards, such as floor clutter or loose rugs. Poor lighting. Pets. Having dementia or memory loss. What actions can I take to lower my  risk of falling?     Physical activity Stay physically fit. Do strength and balance exercises. Consider taking a regular class to build strength and balance. Yoga and tai chi are good options. Vision Have your eyes checked every year and your prescription for glasses or contacts updated as needed. Shoes and walking aids Wear non-skid shoes. Wear shoes that have rubber soles and low heels. Do not wear high heels. Do not walk around the house in socks or  slippers. Use a cane or walker as told by your provider. Home safety Attach secure railings on both sides of your stairs. Install grab bars for your bathtub, shower, and toilet. Use a non-skid mat in your bathtub or shower. Attach bath mats securely with double-sided, non-slip rug tape. Use good lighting in all rooms. Keep a flashlight near your bed. Make sure there is a clear path from your bed to the bathroom. Use night-lights. Do not use throw rugs. Make sure all carpeting is taped or tacked down securely. Remove all clutter from walkways and stairways, including extension cords. Repair uneven or broken steps and floors. Avoid walking on icy or slippery surfaces. Walk on the grass instead of on icy or slick sidewalks. Use ice melter to get rid of ice on walkways in the winter. Use a cordless phone. Questions to ask your health care provider Can you help me check my risk for a fall? Do any of my medicines make me more likely to fall? Should I take a vitamin D supplement? What exercises can I do to improve my strength and balance? Should I make an appointment to have my vision checked? Do I need a bone density test to check for weak bones (osteoporosis)? Would it help to use a cane or a walker? Where to find more information Centers for Disease Control and Prevention, STEADI: TonerPromos.no Community-Based Fall Prevention Programs: TonerPromos.no [employer] on Aging: BaseRingTones.pl Contact a health care provider if: You fall at home. You are afraid of falling at home. You feel weak, drowsy, or dizzy. This information is not intended to replace advice given to you by your health care provider. Make sure you discuss any questions you have with your health care provider. Document Revised: 09/08/2021 Document Reviewed: 09/08/2021 Elsevier Patient Education  2024 [employer].

## 2023-03-11 ENCOUNTER — Telehealth: Payer: Self-pay | Admitting: *Deleted

## 2023-03-11 DIAGNOSIS — S42292D Other displaced fracture of upper end of left humerus, subsequent encounter for fracture with routine healing: Secondary | ICD-10-CM | POA: Diagnosis not present

## 2023-03-11 DIAGNOSIS — M25612 Stiffness of left shoulder, not elsewhere classified: Secondary | ICD-10-CM | POA: Diagnosis not present

## 2023-03-11 NOTE — Progress Notes (Signed)
 Complex Care Management Note  Care Guide Note 03/11/2023 Name: [name] MRN: [id number] DOB: 1980/09/22  Tracey Daniel is a 43 y.o. year old female who sees Kerri Perches, MD for primary care. I reached out to Tracey Daniel by phone today to offer complex care management services.  [name] was given information about Complex Care Management services today including:   The Complex Care Management services include support from the care team which includes your Nurse Care Manager, Clinical Social Worker, or Pharmacist.  The Complex Care Management team is here to help remove barriers to the health concerns and goals most important to you. Complex Care Management services are voluntary, and the patient may decline or stop services at any time by request to their care team member.   Complex Care Management Consent Status: Patient agreed to services and verbal consent obtained.   Follow up plan:  Telephone appointment with complex care management team member scheduled for:  2/26  Encounter Outcome:  Patient Scheduled  Gwenevere Ghazi  [facility]  [facility], [facility] Guide  Direct Dial: [phone]  Fax [phone]

## 2023-03-12 ENCOUNTER — Encounter: Payer: Medicare HMO | Admitting: Obstetrics & Gynecology

## 2023-03-14 ENCOUNTER — Emergency Department (HOSPITAL_COMMUNITY): Payer: Medicare HMO

## 2023-03-14 ENCOUNTER — Emergency Department (HOSPITAL_COMMUNITY)
Admission: EM | Admit: 2023-03-14 | Discharge: 2023-03-14 | Disposition: A | Discharge status: Home / Self Care | Payer: Medicare HMO | Attending: Emergency Medicine | Admitting: Emergency Medicine

## 2023-03-14 ENCOUNTER — Other Ambulatory Visit: Payer: Self-pay

## 2023-03-14 ENCOUNTER — Encounter (HOSPITAL_COMMUNITY): Payer: Self-pay | Admitting: Emergency Medicine

## 2023-03-14 DIAGNOSIS — Z91040 Latex allergy status: Secondary | ICD-10-CM | POA: Diagnosis not present

## 2023-03-14 DIAGNOSIS — S022XXA Fracture of nasal bones, initial encounter for closed fracture: Secondary | ICD-10-CM | POA: Insufficient documentation

## 2023-03-14 DIAGNOSIS — S0181XA Laceration without foreign body of other part of head, initial encounter: Secondary | ICD-10-CM | POA: Diagnosis not present

## 2023-03-14 DIAGNOSIS — F10129 Alcohol abuse with intoxication, unspecified: Secondary | ICD-10-CM | POA: Diagnosis not present

## 2023-03-14 DIAGNOSIS — Z23 Encounter for immunization: Secondary | ICD-10-CM | POA: Insufficient documentation

## 2023-03-14 DIAGNOSIS — S0990XA Unspecified injury of head, initial encounter: Secondary | ICD-10-CM | POA: Insufficient documentation

## 2023-03-14 DIAGNOSIS — J439 Emphysema, unspecified: Secondary | ICD-10-CM | POA: Diagnosis not present

## 2023-03-14 DIAGNOSIS — R220 Localized swelling, mass and lump, head: Secondary | ICD-10-CM | POA: Diagnosis not present

## 2023-03-14 DIAGNOSIS — S299XXA Unspecified injury of thorax, initial encounter: Secondary | ICD-10-CM | POA: Diagnosis not present

## 2023-03-14 DIAGNOSIS — S4992XA Unspecified injury of left shoulder and upper arm, initial encounter: Secondary | ICD-10-CM | POA: Diagnosis not present

## 2023-03-14 MED ORDER — TETRACAINE HCL 0.5 % OP SOLN
2.0000 [drp] | Freq: Once | OPHTHALMIC | Status: AC
  Administered 2023-03-14: 2 [drp] via OPHTHALMIC
  Filled 2023-03-14: qty 4

## 2023-03-14 MED ORDER — TETANUS-DIPHTH-ACELL PERTUSSIS 5-2.5-18.5 LF-MCG/0.5 IM SUSY
0.5000 mL | PREFILLED_SYRINGE | Freq: Once | INTRAMUSCULAR | Status: AC
  Administered 2023-03-14: 0.5 mL via INTRAMUSCULAR
  Filled 2023-03-14: qty 0.5

## 2023-03-14 MED ORDER — CLINDAMYCIN HCL 300 MG PO CAPS: 300.0000 mg | ORAL_CAPSULE | Freq: Three times a day (TID) | ORAL | 0 refills | Status: DC

## 2023-03-14 MED ORDER — FLUORESCEIN SODIUM 1 MG OP STRP
1.0000 | ORAL_STRIP | Freq: Once | OPHTHALMIC | Status: AC
  Administered 2023-03-14: 1 via OPHTHALMIC
  Filled 2023-03-14: qty 1
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
## 2023-03-14 NOTE — ED Notes (Signed)
 Patient transported to X-ray
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
## 2023-03-14 NOTE — ED Notes (Signed)
 Pt ambulated and PO challenge successful.

## 2023-03-14 NOTE — Discharge Instructions (Addendum)
 Your testing shows bilateral nasal bone fractures. no injuries on CT scans.  Follow-up with your ear nose and throat doctor.  Keep head of bed elevated about 45 degrees, do not blow your nose, take antibiotics as prescribed. Return to the ED with new or worsening symptoms.

## 2023-03-16 DIAGNOSIS — M25612 Stiffness of left shoulder, not elsewhere classified: Secondary | ICD-10-CM | POA: Diagnosis not present

## 2023-03-16 DIAGNOSIS — S42292D Other displaced fracture of upper end of left humerus, subsequent encounter for fracture with routine healing: Secondary | ICD-10-CM | POA: Diagnosis not present

## 2023-03-17 ENCOUNTER — Ambulatory Visit: Payer: Self-pay

## 2023-03-17 ENCOUNTER — Encounter (INDEPENDENT_AMBULATORY_CARE_PROVIDER_SITE_OTHER): Payer: Self-pay | Admitting: Otolaryngology

## 2023-03-17 ENCOUNTER — Ambulatory Visit (INDEPENDENT_AMBULATORY_CARE_PROVIDER_SITE_OTHER): Payer: Medicare HMO | Admitting: Otolaryngology

## 2023-03-17 VITALS — BP 134/79 | HR 84 | Ht 66.0 in | Wt 162.0 lb

## 2023-03-17 DIAGNOSIS — S022XXA Fracture of nasal bones, initial encounter for closed fracture: Secondary | ICD-10-CM | POA: Diagnosis not present

## 2023-03-17 DIAGNOSIS — S0993XA Unspecified injury of face, initial encounter: Secondary | ICD-10-CM | POA: Diagnosis not present

## 2023-03-17 DIAGNOSIS — R0981 Nasal congestion: Secondary | ICD-10-CM | POA: Diagnosis not present

## 2023-03-17 DIAGNOSIS — J342 Deviated nasal septum: Secondary | ICD-10-CM

## 2023-03-17 MED ORDER — ACETAMINOPHEN 500 MG PO TABS: 500.0000 mg | ORAL_TABLET | Freq: Four times a day (QID) | ORAL | 1 refills | Status: AC

## 2023-03-17 MED ORDER — IBUPROFEN 600 MG PO TABS: 600.0000 mg | ORAL_TABLET | Freq: Four times a day (QID) | ORAL | 0 refills | Status: DC

## 2023-03-17 NOTE — Patient Instructions (Signed)
 Visit Information  Thank you for taking time to visit with me today. Please don't hesitate to contact me if I can be of assistance to you.   Following are the goals we discussed today:  Patient will contact food banks for additional assistance. Patient will use $50 OTC benefits through [facility].    If you are experiencing a Mental Health or Behavioral Health Crisis or need someone to talk to, please call 911  Patient verbalizes understanding of instructions and care plan provided today and agrees to view in MyChart. Active MyChart status and patient understanding of how to access instructions and care plan via MyChart confirmed with patient.     No further follow up required: Patient does not request a follow up visit. Lysle Morales, BSW [place]  [facility], [facility] Social Worker Direct Dial: [phone]  Fax: [phone] Website: [email or web address]
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
## 2023-03-17 NOTE — Patient Outreach (Signed)
 Care Coordination   Initial Visit Note   03/17/2023 Name: Tracey Daniel MRN: [id number] DOB: 1980-12-04  Tracey Daniel is a 43 y.o. year old female who sees Lodema Hong, Milus Mallick, MD for primary care. I spoke with  Tracey Daniel by phone today.  What matters to the patients health and wellness today?  Patient needs resources for additional food.    Goals Addressed             This Visit's Progress    Food Resources       Interventions Today    Flowsheet Row Most Recent Value  Chronic Disease   Chronic disease during today's visit Chronic Obstructive Pulmonary Disease (COPD), Hypertension (HTN)  General Interventions   General Interventions Discussed/Reviewed General Interventions Discussed, General Interventions Reviewed, [employer], Communication with  [employer] receives [employer] and [employer].Pt reports sometimes food is low.SW provides a food Radiographer, therapeutic for additional resources.Pt declined budget counseling.]  Communication with --  [SW t/c [employer] & pt has $50 quarterly OTC benefits.A book will be mailed to pt to call in the order to [facility] [phone].]              SDOH assessments and interventions completed:  Yes  SDOH Interventions Today    Flowsheet Row Most Recent Value  SDOH Interventions   Food Insecurity Interventions Other (Comment)  [Receives Foodstamps, provide food bank referral.]  Housing Interventions Intervention Not Indicated  Transportation Interventions Intervention Not Indicated  [Has a car and access to Medicaid Transportation]  Utilities Interventions Intervention Not Indicated        Care Coordination Interventions:  Yes, provided   Follow up plan: No further intervention required.   Encounter Outcome:  Patient Visit Completed

## 2023-03-17 NOTE — Patient Instructions (Signed)
 Take tylenol and Ibuprofen for pain every 6 hrs staggered 3 hrs apart from each other

## 2023-03-18 DIAGNOSIS — S42292D Other displaced fracture of upper end of left humerus, subsequent encounter for fracture with routine healing: Secondary | ICD-10-CM | POA: Diagnosis not present

## 2023-03-18 DIAGNOSIS — M25612 Stiffness of left shoulder, not elsewhere classified: Secondary | ICD-10-CM | POA: Diagnosis not present

## 2023-03-19 ENCOUNTER — Ambulatory Visit: Payer: Medicare HMO | Admitting: Gastroenterology

## 2023-03-24 ENCOUNTER — Ambulatory Visit (INDEPENDENT_AMBULATORY_CARE_PROVIDER_SITE_OTHER): Payer: Medicare HMO | Admitting: Family Medicine

## 2023-03-24 ENCOUNTER — Encounter: Payer: Self-pay | Admitting: Family Medicine

## 2023-03-24 VITALS — BP 113/71 | HR 80 | Resp 16 | Ht 66.0 in | Wt 162.1 lb

## 2023-03-24 DIAGNOSIS — F17218 Nicotine dependence, cigarettes, with other nicotine-induced disorders: Secondary | ICD-10-CM | POA: Diagnosis not present

## 2023-03-24 DIAGNOSIS — M8580 Other specified disorders of bone density and structure, unspecified site: Secondary | ICD-10-CM | POA: Diagnosis not present

## 2023-03-24 DIAGNOSIS — R87612 Low grade squamous intraepithelial lesion on cytologic smear of cervix (LGSIL): Secondary | ICD-10-CM | POA: Diagnosis not present

## 2023-03-24 DIAGNOSIS — R87810 Cervical high risk human papillomavirus (HPV) DNA test positive: Secondary | ICD-10-CM | POA: Diagnosis not present

## 2023-03-24 DIAGNOSIS — Z1231 Encounter for screening mammogram for malignant neoplasm of breast: Secondary | ICD-10-CM

## 2023-03-24 DIAGNOSIS — E559 Vitamin D deficiency, unspecified: Secondary | ICD-10-CM

## 2023-03-24 DIAGNOSIS — S42292D Other displaced fracture of upper end of left humerus, subsequent encounter for fracture with routine healing: Secondary | ICD-10-CM | POA: Diagnosis not present

## 2023-03-24 DIAGNOSIS — E785 Hyperlipidemia, unspecified: Secondary | ICD-10-CM | POA: Diagnosis not present

## 2023-03-24 DIAGNOSIS — M25612 Stiffness of left shoulder, not elsewhere classified: Secondary | ICD-10-CM | POA: Diagnosis not present

## 2023-03-24 DIAGNOSIS — K219 Gastro-esophageal reflux disease without esophagitis: Secondary | ICD-10-CM | POA: Diagnosis not present

## 2023-03-24 MED ORDER — DENOSUMAB 60 MG/ML ~~LOC~~ SOSY
60.0000 mg | PREFILLED_SYRINGE | Freq: Once | SUBCUTANEOUS | Status: AC
  Administered 2023-03-24: 60 mg via SUBCUTANEOUS

## 2023-03-24 NOTE — Patient Instructions (Addendum)
 F/U in 6 months, call if you meed me sooner  Please schedule July mammogram at checkout, [facility]  [facility]  Nurse pls document vision screen  Prolia administered in office today  Nurse pls provide [facility] location for [facility] lab draw, she lives in [place]  Keep open mind for therapy and addiction support  Thanks for choosing [facility], we consider it a privelige to serve you.

## 2023-03-24 NOTE — Assessment & Plan Note (Signed)
 Hyperlipidemia:Low fat diet discussed and encouraged.   Lipid Panel  Lab Results  Component Value Date   CHOL 117 06/09/2022   HDL 37 (L) 06/09/2022   LDLCALC 52 06/09/2022   TRIG 167 (H) 06/09/2022   CHOLHDL 3.2 06/09/2022     Updated lab needed at/ before next visit.

## 2023-03-24 NOTE — Assessment & Plan Note (Signed)
 Updated lab needed at/ before next visit.

## 2023-04-01 DIAGNOSIS — G43719 Chronic migraine without aura, intractable, without status migrainosus: Secondary | ICD-10-CM | POA: Diagnosis not present

## 2023-04-01 DIAGNOSIS — G518 Other disorders of facial nerve: Secondary | ICD-10-CM | POA: Diagnosis not present

## 2023-04-01 DIAGNOSIS — M542 Cervicalgia: Secondary | ICD-10-CM | POA: Diagnosis not present

## 2023-04-01 DIAGNOSIS — M791 Myalgia, unspecified site: Secondary | ICD-10-CM | POA: Diagnosis not present

## 2023-04-05 ENCOUNTER — Other Ambulatory Visit: Payer: Self-pay | Admitting: Family Medicine

## 2023-04-05 ENCOUNTER — Ambulatory Visit: Payer: Medicare HMO | Admitting: Internal Medicine

## 2023-04-07 ENCOUNTER — Other Ambulatory Visit: Payer: Self-pay | Admitting: Family Medicine

## 2023-04-07 DIAGNOSIS — I10 Essential (primary) hypertension: Secondary | ICD-10-CM

## 2023-04-07 DIAGNOSIS — M25612 Stiffness of left shoulder, not elsewhere classified: Secondary | ICD-10-CM | POA: Diagnosis not present

## 2023-04-13 ENCOUNTER — Encounter: Payer: Self-pay | Admitting: Internal Medicine

## 2023-04-13 ENCOUNTER — Ambulatory Visit (INDEPENDENT_AMBULATORY_CARE_PROVIDER_SITE_OTHER): Admitting: Internal Medicine

## 2023-04-13 VITALS — BP 137/83 | HR 80 | Temp 98.2°F | Ht 66.0 in | Wt 160.2 lb

## 2023-04-13 DIAGNOSIS — R159 Full incontinence of feces: Secondary | ICD-10-CM | POA: Diagnosis not present

## 2023-04-13 DIAGNOSIS — R150 Incomplete defecation: Secondary | ICD-10-CM

## 2023-04-13 DIAGNOSIS — K5900 Constipation, unspecified: Secondary | ICD-10-CM

## 2023-04-13 DIAGNOSIS — K3184 Gastroparesis: Secondary | ICD-10-CM | POA: Diagnosis not present

## 2023-04-13 DIAGNOSIS — K219 Gastro-esophageal reflux disease without esophagitis: Secondary | ICD-10-CM | POA: Diagnosis not present

## 2023-04-13 NOTE — Patient Instructions (Addendum)
 It was good to see you again today.  For now, Continue Protonix or pantoprazole twice daily before meals  Back off on Linzess to 145 every other day to see if this will not help with your bouts of incontinence.  Ultimately, you may need a lower dose  May continue Reglan or metoclopramide 2 and half milligrams twice daily (one half of a 5 mg tablet)  For side effects as discussed.  Office visit here in 3 months.  Will likely see if we can stop the Reglan at that time.  Symptoms recur, I would recommend a repeat upper endoscopy with particular attention to your pyloric channel.  It may need to be dilated again.

## 2023-04-13 NOTE — Progress Notes (Signed)
 Primary Care Physician:  Kerri Perches, MD Primary Gastroenterologist:  Dr. Jena Gauss  Pre-Procedure History & Physical: HPI:  Tracey Daniel is a 43 y.o. female here for follow-up gastroparesis history of pylorospasm/stricture requiring dilation and Botox in the distant past.  Gastric emptying delayed on prior imaging.  Component of mechanical obstruction along the way not excluded.  Worsening of symptoms. On Reglan 2.5 mg twice daily.  Absolutely denies any dystonia or extraparametal side effects (Reglan was increased to 5 mg twice daily at her last visit with patient states she is taking half of a tablet twice daily). Seen earlier this year with epigastric pain. No vomiting. She has cut back on pork which she ate on a regular basis and is feeling overall much better. GERD well-controlled on twice daily PPI.  No dysphagia.  Recently fell and broke her shoulder and nose.  Recovering unremarkably    Past Medical History:  Diagnosis Date   Alopecia    Anemia of other chronic disease 11/29/2012   Asthma    Back pain, chronic    Bronchitis    Chronic abdominal pain    Constipation    COPD (chronic obstructive pulmonary disease) (HCC)    Cyst of skin    mid spine area   Depression 2000   h/o suicidal ideation   Gastric outlet obstruction    Gastritis    Gastroparesis    GERD (gastroesophageal reflux disease)    Hypertension    LGSIL of cervix of undetermined significance 07/08/2020   07/08/20 pt aware will get colpo, per [facility]   Migraine    Migraines    Nausea and vomiting    recurrent   Nicotine addiction    Osteoporosis    Peptic ulcer disease    Pyloric spasm 03/30/2011   Seasonal allergies    Sinusitis    Substance abuse (HCC) 2008   marijuana   Vaginal Pap smear, abnormal    Vitamin D deficiency     Past Surgical History:  Procedure Laterality Date   BALLOON DILATION N/A 02/09/2013   Procedure: BALLOON DILATION;  Surgeon: Barrie Folk, MD;  Location:  [facility];  Service: Endoscopy;  Laterality: N/A;   BALLOON DILATION N/A 03/10/2018   Procedure: BALLOON DILATION;  Surgeon: Corbin Ade, MD;  Location: [facility];  Service: Endoscopy;  Laterality: N/A;   BILIARY DILATION  06/01/2018   Procedure: PYLORIC DILATION;  Surgeon: Meridee Score Netty Starring., MD;  Location: [facility] END[facility]COPY;  Service: Gastroenterology;;   BIOPSY  06/01/2018   Procedure: BIOPSY;  Surgeon: Lemar Lofty., MD;  Location: [facility] ENDOSCOPY;  Service: Gastroenterology;;   BOTOX INJECTION N/A 02/09/2013   Procedure: BOTOX INJECTION;  Surgeon: Barrie Folk, MD;  Location: [facility];  Service: Endoscopy;  Laterality: N/A;  possible balloon   BOTOX INJECTION N/A 10/24/2015   Procedure: BOTOX INJECTION;  Surgeon: Corbin Ade, MD;  [facility]: [facility];  Service: Endoscopy;  Laterality: N/A;   BOTOX INJECTION N/A 03/10/2018   Procedure: BOTOX INJECTION;  Surgeon: Corbin Ade, MD;  [facility];  Service: Endoscopy;  Laterality: N/A;  Melanie notified   BOTOX INJECTION  06/01/2018   Procedure: BOTOX INJECTION;  Surgeon: Meridee Score Netty Starring., MD;  Location: [facility] ENDOSCOPY;  Service: Gastroenterology;;   CARPAL TUNNEL RELEASE     left hand   COLONOSCOPY WITH PROPOFOL N/A 09/20/2014   RMR: Normal ileo-colonoscopy   ESOPHAGEAL DILATION  12/25/2010   Procedure: ESOPHAGEAL DILATION;  Surgeon: [name], MD;  [facility];  Service: Endoscopy;;   ESOPHAGEAL MANOMETRY N/A 10/03/2018   Surgeon: Napoleon Form, MD;  Normal.   ESOPHAGOGASTRODUODENOSCOPY  12/25/2010   [name], MD;  moderate gastritis, ?goo secondary to pylorspasm. BX showed reactive gstropathy no h.pyori, SB mucosa with intramucosal lymphocytosis and partial villous blunting (TTG 4.0 normal)   ESOPHAGOGASTRODUODENOSCOPY N/A 11/14/2012   [id number]:QMVHQ hiatal hernia. Abnormal gastric mucosa of  uncertain significance-status post biopsy. Subjectively, patient may have  recurrent symptomatic, pylorospasm.   ESOPHAGOGASTRODUODENOSCOPY (EGD) WITH PROPOFOL N/A 02/09/2013   elongated stomach, partial lower esophageal ring widely patent. No obvious pyloric stenosis s/p Botox   ESOPHAGOGASTRODUODENOSCOPY (EGD) WITH PROPOFOL N/A 10/24/2015   Procedure: ESOPHAGOGASTRODUODENOSCOPY (EGD) WITH PROPOFOL;  Surgeon: Corbin Ade, MD;  [facility];  Service: Endoscopy;  Laterality: N/A;  830    ESOPHAGOGASTRODUODENOSCOPY (EGD) WITH PROPOFOL N/A 03/10/2018   Dr. Jena Gauss: normal esophagus, elongated, dilated stomach likely stigmata of slow gastric emptying due to narrowing of pyloric channel, s/p balloon dilatation of pyloric channel. Small hiatal hernia, normal duodenal bulb and second portion of duodenum   ESOPHAGOGASTRODUODENOSCOPY (EGD) WITH PROPOFOL N/A 06/01/2018   Surgeon: Lemar Lofty., MD; normal esophagus s/p biopsy, small hiatal hernia, erythematous mucosa in the cardia, gastric fundus, gastric body, erosive gastropathy s/p biopsy, normal pylorus s/p dilation and Botox injection, normal duodenum s/p biopsy.  Surgical pathology all benign.   LAPAROSCOPIC CHOLECYSTECTOMY  2002   [facility]?  [place]?   laser surgery on cervix     NASAL SEPTUM SURGERY  01/29/2017   ORIF HUMERUS FRACTURE Left 09/29/2022   Procedure: OPEN REDUCTION INTERNAL FIXATION (ORIF) PROXIMAL HUMERUS FRACTURE;  Surgeon: Luci Bank, MD;  Location: [facility] OR;  Service: Orthopedics;  Laterality: Left;   TOE SURGERY      Prior to Admission medications   Medication Sig Start Date End Date Taking? Authorizing Provider  acetaminophen (TYLENOL) 500 MG tablet Take 1 tablet (500 mg total) by mouth every 6 (six) hours. Please take every 6 hrs and stagger with Motrin 3 hrs apart from each other. Please do not exceed maximum daily dose to avoid liver damage 03/17/23  Yes Soldatova, Eusebio Friendly, MD  albuterol (VENTOLIN HFA) 108 (90 Base) MCG/ACT inhaler INHALE 2 PUFFS INTO THE LUNGS EVERY 6 HOURS  AS NEEDED FOR WHEEZING OR SHORTNESS OF BREATH 07/16/22  Yes Kerri Perches, MD  azelastine (ASTELIN) 0.1 % nasal spray USE 2 SPRAYS IN EACH NOSTRIL TWICE DAILY AS DIRECTED 01/16/20  Yes Kerri Perches, MD  budesonide-formoterol (SYMBICORT) 80-4.5 MCG/ACT inhaler INHALE 2 PUFFS INTO THE LUNGS TWICE DAILY 06/29/22  Yes Kerri Perches, MD  buPROPion (WELLBUTRIN SR) 150 MG 12 hr tablet TAKE 1 TABLET(150 MG) BY MOUTH TWICE DAILY 04/05/23  Yes Kerri Perches, MD  calcium-vitamin D (OSCAL WITH D) 500-5 MG-MCG tablet Take 1 tablet by mouth 2 (two) times daily. 08/14/21  Yes Kerri Perches, MD  carbamide peroxide (DEBROX) 6.5 % OTIC solution Place 5 drops into the right ear 2 (two) times daily. Patient taking differently: Place 5 drops into the right ear as needed (itching). 07/12/22  Yes Clark, Meghan R, PA-C  clobetasol (OLUX) 0.05 % topical foam Apply to aa's scalp QD-BID 3 days per week. Avoid applying to face, groin, and axilla. Use as directed. Long-term use can cause thinning of the skin. 10/22/22  Yes Deirdre Evener, MD  conjugated estrogens (PREMARIN) vaginal cream Use 0.5 gm in  vaginal 2-3 week 12/28/22  Yes Cyril Mourning A, NP  estradiol (ESTRACE) 2 MG tablet Take 1 tablet (2 mg total) by mouth daily. 12/28/22  Yes Adline Potter, NP  feeding supplement (ENSURE CLINICAL STRENGTH) LIQD Take 237 mLs by mouth 3 (three) times daily with meals. 01/01/11  Yes Dhungel, Nishant, MD  fenofibrate (TRICOR) 48 MG tablet TAKE 1 TABLET(48 MG) BY MOUTH DAILY 09/14/22  Yes Kerri Perches, MD  fluticasone [facility]) 50 MCG/ACT nasal spray Place 2 sprays into both nostrils daily. 06/24/21  Yes Kerri Perches, MD  gabapentin (NEURONTIN) 100 MG capsule TAKE 1 CAPSULE(100 MG) BY MOUTH THREE TIMES DAILY 01/20/22  Yes Kerri Perches, MD  linaclotide [facility]) 145 MCG CAPS capsule Take 1 capsule (145 mcg total) by mouth daily before breakfast. 05/02/19  Yes Anice Paganini, NP  megestrol  (MEGACE) 40 MG tablet Take 1 tablet (40 mg total) by mouth daily. 10/02/22  Yes Lazaro Arms, MD  metoCLOPramide (REGLAN) 5 MG tablet TAKE 1/2 TABLET BY MOUTH ONCE DAILY BEFORE A MEAL. MAY TAKE A SECOND DOSE IF NEEDED 02/18/23  Yes Clearance Coots, Kristen S, PA-C  olmesartan (BENICAR) 20 MG tablet TAKE 1 TABLET(20 MG) BY MOUTH DAILY 04/07/23  Yes Kerri Perches, MD  pantoprazole (PROTONIX) 40 MG tablet Take 1 tablet (40 mg total) by mouth 2 (two) times daily before a meal. 01/28/23  Yes Clearance Coots, Kristen S, PA-C  pimecrolimus (ELIDEL) 1 % cream Apply to eczema of the eyelids BID PRN. 10/22/22  Yes Deirdre Evener, MD  prazosin (MINIPRESS) 1 MG capsule Take 1 mg by mouth at bedtime. 02/21/20  Yes [provider]  progesterone (PROMETRIUM) 200 MG capsule TAKE 1 CAPSULE BY MOUTH DAILY AT BEDTIME, DAYS 1-21 OF [facility] MONTH 12/28/22  Yes Adline Potter, NP  PROLIA 60 MG/ML SOSY injection INJECT 60 MG UNDER THE SKIN EVERY 6 MONTHS 02/08/23  Yes Kerri Perches, MD  propranolol (INDERAL) 10 MG tablet Take 1 tablet (10 mg total) by mouth 3 (three) times daily. 03/11/18  Yes Emokpae, Courage, MD  risperiDONE (RISPERDAL) 0.5 MG tablet Take 0.5 mg at bedtime by mouth.   Yes [provider]  sertraline (ZOLOFT) 50 MG tablet Take 50 mg by mouth at bedtime.    Yes [provider]  tiZANidine (ZANAFLEX) 4 MG tablet TAKE 1 TABLET(4 MG) BY MOUTH TWICE DAILY AS NEEDED FOR BACK SPASMS 12/21/22  Yes Kerri Perches, MD  Vitamin D, Ergocalciferol, (DRISDOL) 1.25 MG (50000 UNIT) CAPS capsule TAKE 1 CAPSULE BY MOUTH 1 TIME A WEEK 06/10/22  Yes Kerri Perches, MD  zonisamide (ZONEGRAN) 50 MG capsule Take 150 mg by mouth at bedtime.  02/21/17  Yes [provider]    Allergies as of 04/13/2023 - Review Complete 04/13/2023  Allergen Reaction Noted   Benadryl [diphenhydramine] Anaphylaxis 03/25/2014   Penicillins Other (See Comments)    Latex Itching and Other (See Comments) 11/30/2011     Family History  Problem Relation Age of Onset   Lung cancer Mother 55   Cancer Father    Diabetes Father    Liver disease Father        liver transplant at [facility], age 85   Multiple sclerosis Sister 13   Depression Sister 4   Alcohol abuse Sister    Bipolar disorder Sister    Schizophrenia Sister    Heart disease Maternal Grandmother    Parkinson's disease Maternal Grandfather    Cancer - Colon Neg  Hx     Social History   Socioeconomic History   Marital status: Significant Other    Spouse name: Not on file   Number of children: 0   Years of education: 9   Highest education level: 9th grade  Occupational History   Occupation: disability    Employer: UNEMPLOYED  Tobacco Use   Smoking status: Every Day    Current packs/day: 0.25    Average packs/day: 0.3 packs/day for 15.0 years (3.8 ttl pk-yrs)    Types: Cigarettes   Smokeless tobacco: Never   Tobacco comments:    2 per day  Vaping Use   Vaping status: Never Used  Substance and Sexual Activity   Alcohol use: Yes    Comment: 4, 12 oz beer per day.   Drug use: Yes    Frequency: 2.0 times per week    Types: Marijuana    Comment: once or twice a week   Sexual activity: Yes    Birth control/protection: Post-menopausal  Other Topics Concern   Not on file  Social History Narrative   Not on file   Social Drivers of Health   Financial Resource Strain: Medium Risk (03/10/2023)   Overall Financial Resource Strain (CARDIA)    Difficulty of Paying Living Expenses: Somewhat hard  Food Insecurity: Food Insecurity Present (03/17/2023)   Hunger Vital Sign    Worried About Running Out of Food in the Last Year: Never true    Ran Out of Food in the Last Year: Often true  Transportation Needs: No Transportation Needs (03/17/2023)   PRAPARE - Administrator, Civil Service (Medical): No    Lack of Transportation (Non-Medical): No  Physical Activity: Insufficiently Active (03/10/2023)   Exercise Vital Sign    Days  of Exercise per Week: 2 days    Minutes of Exercise per Session: 30 min  Stress: No Stress Concern Present (03/10/2023)   [employer] of Occupational Health - Occupational Stress Questionnaire    Feeling of Stress : Not at all  Social Connections: Moderately Isolated (03/10/2023)   Social Connection and Isolation Panel [NHANES]    Frequency of Communication with Friends and Family: Three times a week    Frequency of Social Gatherings with Friends and Family: Twice a week    Attends Religious Services: Never    Database administrator or Organizations: No    Attends Banker Meetings: Never    Marital Status: Living with partner  Intimate Partner Violence: Not At Risk (03/17/2023)   Humiliation, Afraid, Rape, and Kick questionnaire    Fear of Current or Ex-Partner: No    Emotionally Abused: No    Physically Abused: No    Sexually Abused: No    Review of Systems: See HPI, otherwise negative ROS  Physical Exam: BP 137/83 (BP Location: Right Arm, Patient Position: Sitting, Cuff Size: Normal)   Pulse 80   Temp 98.2 F (36.8 C) (Oral)   Ht 5\' 6"  (1.676 m)   Wt 160 lb 3.2 oz (72.7 kg)   LMP  (LMP Unknown) Comment: pt reports premature menopause  SpO2 100%   BMI 25.86 kg/m  General:   Alert,  Well-developed, well-nourished, pleasant and cooperative in NAD Lungs:  Clear throughout to auscultation.   No wheezes, crackles, or rhonchi. No acute distress. Heart:  Regular rate and rhythm; no murmurs, clicks, rubs,  or gallops. Abdomen: Nondistended.  Soft and nontender.  No succussion splash.    Impression/Plan: 43 year old lady with a  history of delayed gastric emptying but also element of pyloric stenosis previously dilated and treated with Botox last 2020.  Subsequent GES normal.  Some recurrent symptoms recently in  the setting of alcohol and high-fat diet. Epigastric pain and nausea improved with Reglan 2.5 mg twice daily-patient states not taking 5 mg twice daily.  No  side effects whatsoever. Reviewed all things that delayed gastric emptying such as fatty foods and alcohol.  Having intermittent rectal incontinence at least 1 episode daily on Linzess 145 which is done very well for her baseline constipation.  We need to attenuate that dose before we do anything else this would likely help her symptoms.  Recommendations:  For now, Continue Protonix or pantoprazole twice daily before meals  Back off on Linzess to 145 every other day to see if this will not help with your bouts of incontinence.  Ultimately, you may need a lower dose  May continue Reglan or metoclopramide 2 and half milligrams twice daily (one half of a 5 mg tablet)  Side effects reviewed with the patient.  If she is develops any side effects she is to stop Reglan and let me know. Office visit here in 3 months.  Will likely see if we can stop the Reglan at that time.  Continue gastroparesis diet which means low-fat (limit  fatty foods like pork) and decreased alcohol intake  If Symptoms recur, I would recommend a repeat upper endoscopy with particular attention to the pyloric channel.  It may or may not need to be dilated again.    Notice: This dictation was prepared with Dragon dictation along with smaller phrase technology. Any transcriptional errors that result from this process are unintentional and may not be corrected upon review.

## 2023-04-14 ENCOUNTER — Other Ambulatory Visit: Payer: Self-pay | Admitting: Family Medicine

## 2023-04-15 DIAGNOSIS — M25612 Stiffness of left shoulder, not elsewhere classified: Secondary | ICD-10-CM | POA: Diagnosis not present

## 2023-04-15 DIAGNOSIS — S42292D Other displaced fracture of upper end of left humerus, subsequent encounter for fracture with routine healing: Secondary | ICD-10-CM | POA: Diagnosis not present

## 2023-04-21 ENCOUNTER — Other Ambulatory Visit: Payer: Self-pay | Admitting: Family Medicine

## 2023-04-22 DIAGNOSIS — M25612 Stiffness of left shoulder, not elsewhere classified: Secondary | ICD-10-CM | POA: Diagnosis not present

## 2023-04-22 DIAGNOSIS — S42292D Other displaced fracture of upper end of left humerus, subsequent encounter for fracture with routine healing: Secondary | ICD-10-CM | POA: Diagnosis not present

## 2023-04-27 DIAGNOSIS — S42292D Other displaced fracture of upper end of left humerus, subsequent encounter for fracture with routine healing: Secondary | ICD-10-CM | POA: Diagnosis not present

## 2023-04-27 DIAGNOSIS — M25612 Stiffness of left shoulder, not elsewhere classified: Secondary | ICD-10-CM | POA: Diagnosis not present

## 2023-04-28 ENCOUNTER — Other Ambulatory Visit: Payer: Self-pay | Admitting: Family Medicine

## 2023-04-28 DIAGNOSIS — L6681 Central centrifugal cicatricial alopecia: Secondary | ICD-10-CM

## 2023-04-29 ENCOUNTER — Other Ambulatory Visit: Payer: Self-pay | Admitting: Family Medicine

## 2023-04-29 DIAGNOSIS — S42292D Other displaced fracture of upper end of left humerus, subsequent encounter for fracture with routine healing: Secondary | ICD-10-CM | POA: Diagnosis not present

## 2023-04-29 DIAGNOSIS — M25612 Stiffness of left shoulder, not elsewhere classified: Secondary | ICD-10-CM | POA: Diagnosis not present

## 2023-04-29 NOTE — Telephone Encounter (Signed)
 Copied from CRM [id number]. Topic: Clinical - Medication Refill >> Apr 29, 2023 10:26 AM Benetta Spar B wrote: Most Recent Primary Care Visit:  Provider: Kerri Perches  Department: [facility]  Visit Type: OFFICE VISIT  Date: 03/24/2023  Medication: : MINOCYCLINE 50MG  CAPSULES]  Has the patient contacted their pharmacy? yes (Agent: If yes, when and what did the pharmacy advise?)contact pcp/shows denied, refill not apporpiate,not sure why  Is this the correct pharmacy for this prescription?   This is the patient's preferred pharmacy:  [id number] - [place], [place] - [address] AT [facility] OF [address] [place] [address] Phone: [phone] Fax: [phone]  [id number] - [place] - [address] [place] [address] Phone: [phone] Fax: [phone]   Has the prescription been filled recently? no  Is the patient out of the medication? yes  Has the patient been seen for an appointment in the last year OR does the patient have an upcoming appointment? yes  Can we respond through MyChart? yes  Agent: Please be advised that Rx refills may take up to 3 business days. We ask that you follow-up with your pharmacy.

## 2023-04-30 ENCOUNTER — Encounter: Payer: Self-pay | Admitting: Family Medicine

## 2023-04-30 NOTE — Progress Notes (Signed)
   Tracey Daniel     MRN: [id number]      DOB: 05/20/1980  Chief Complaint  Patient presents with   Hypertension   Hyperlipidemia    Follow up visit     HPI Ms. Zeis is here for follow up and re-evaluation of chronic medical conditions, medication management and review of any available recent lab and radiology data.  Preventive health is updated, specifically  Cancer screening and Immunization.   Questions or concerns regarding consultations or procedures which the PT has had in the interim are  addressed. The PT denies any adverse reactions to current medications since the last visit.  There are no new concerns. Still grappling with depression anxiety asnd stress, no therapist yet There are no specific complaints   ROS Denies recent fever or chills. Denies sinus pressure, nasal congestion, ear pain or sore throat. Denies chest congestion, productive cough or wheezing. Denies chest pains, palpitations and leg swelling Denies abdominal pain, nausea, vomiting,diarrhea or constipation.   Denies dysuria, frequency, hesitancy or incontinence. Denies joint pain, swelling and limitation in mobility. Denies headaches, seizures, numbness, or tingling. PE  BP 113/71   Pulse 80   Resp 16   Ht 5\' 6"  (1.676 m)   Wt 162 lb 1.9 oz (73.5 kg)   LMP  (LMP Unknown) Comment: pt reports premature menopause  SpO2 96%   BMI 26.17 kg/m   Patient alert and oriented and in no cardiopulmonary distress.  HEENT: No facial asymmetry, EOMI,     Neck supple .  Chest: Clear to auscultation bilaterally.  CVS: S1, S2 no murmurs, no S3.Regular rate.  ABD: Soft non tender.   Ext: No edema  MS: Adequate ROM spine, shoulders, hips and knees.  Skin: Intact, no ulcerations or rash noted.  Psych: Good eye contact, flat affect. Memory intact not anxious mildly depressed appearing.  CNS: CN 2-12 intact, power,  normal throughout.no focal deficits noted.   Assessment & Plan  Vitamin D  deficiency Updated lab needed at/ before next visit.   Hyperlipidemia LDL goal <100 Hyperlipidemia:Low fat diet discussed and encouraged.   Lipid Panel  Lab Results  Component Value Date   CHOL 117 06/09/2022   HDL 37 (L) 06/09/2022   LDLCALC 52 06/09/2022   TRIG 167 (H) 06/09/2022   CHOLHDL 3.2 06/09/2022     Updated lab needed at/ before next visit.   Osteopenia Will updat e dexa this year. Prolia given SQ at visit  Nicotine dependence, cigarettes, with other nicotine-induced disorders Asked:confirms currently smokes cigarettes Assess: Unwilling to set a quit date, but is cutting back Advise: needs to QUIT to reduce risk of cancer, cardio and cerebrovascular disease Assist: counseled for 5 minutes and literature provided Arrange: follow up in 2 to 4 months   Gastroesophageal reflux disease without esophagitis Controlled, no change in medication

## 2023-05-01 NOTE — Assessment & Plan Note (Signed)
 Controlled, no change in medication

## 2023-05-01 NOTE — Assessment & Plan Note (Signed)
 Will updat e dexa this year. Prolia given SQ at visit

## 2023-05-01 NOTE — Assessment & Plan Note (Signed)
Asked:confirms currently smokes cigarettes °Assess: Unwilling to set a quit date, but is cutting back °Advise: needs to QUIT to reduce risk of cancer, cardio and cerebrovascular disease °Assist: counseled for 5 minutes and literature provided °Arrange: follow up in 2 to 4 months ° °

## 2023-05-12 DIAGNOSIS — S42292D Other displaced fracture of upper end of left humerus, subsequent encounter for fracture with routine healing: Secondary | ICD-10-CM | POA: Diagnosis not present

## 2023-05-12 DIAGNOSIS — M25612 Stiffness of left shoulder, not elsewhere classified: Secondary | ICD-10-CM | POA: Diagnosis not present

## 2023-05-14 ENCOUNTER — Telehealth: Payer: Self-pay

## 2023-05-18 NOTE — Telephone Encounter (Signed)
 Lvm to cb

## 2023-05-19 ENCOUNTER — Other Ambulatory Visit: Payer: Self-pay | Admitting: Gastroenterology

## 2023-05-19 DIAGNOSIS — S42292D Other displaced fracture of upper end of left humerus, subsequent encounter for fracture with routine healing: Secondary | ICD-10-CM | POA: Diagnosis not present

## 2023-05-19 DIAGNOSIS — K3184 Gastroparesis: Secondary | ICD-10-CM

## 2023-05-19 DIAGNOSIS — M25612 Stiffness of left shoulder, not elsewhere classified: Secondary | ICD-10-CM | POA: Diagnosis not present

## 2023-05-19 NOTE — Telephone Encounter (Signed)
 Second attempt, call could not be completed

## 2023-05-20 ENCOUNTER — Telehealth: Payer: Self-pay | Admitting: *Deleted

## 2023-05-20 DIAGNOSIS — S42292D Other displaced fracture of upper end of left humerus, subsequent encounter for fracture with routine healing: Secondary | ICD-10-CM | POA: Diagnosis not present

## 2023-05-20 DIAGNOSIS — M25612 Stiffness of left shoulder, not elsewhere classified: Secondary | ICD-10-CM | POA: Diagnosis not present

## 2023-05-20 NOTE — Telephone Encounter (Signed)
 Pt needs a refill on zofran . Last OV 04/13/2023

## 2023-05-21 ENCOUNTER — Other Ambulatory Visit: Payer: Self-pay

## 2023-05-21 DIAGNOSIS — M25612 Stiffness of left shoulder, not elsewhere classified: Secondary | ICD-10-CM | POA: Diagnosis not present

## 2023-05-21 DIAGNOSIS — S42292D Other displaced fracture of upper end of left humerus, subsequent encounter for fracture with routine healing: Secondary | ICD-10-CM | POA: Diagnosis not present

## 2023-05-21 DIAGNOSIS — E559 Vitamin D deficiency, unspecified: Secondary | ICD-10-CM

## 2023-05-21 NOTE — Telephone Encounter (Signed)
Pt informed

## 2023-05-24 ENCOUNTER — Other Ambulatory Visit: Payer: Self-pay | Admitting: *Deleted

## 2023-05-24 ENCOUNTER — Other Ambulatory Visit: Payer: Self-pay | Admitting: Family Medicine

## 2023-05-24 MED ORDER — ONDANSETRON HCL 4 MG PO TABS: 4.0000 mg | ORAL_TABLET | Freq: Four times a day (QID) | ORAL | 0 refills | Status: AC | PRN

## 2023-05-24 NOTE — Telephone Encounter (Unsigned)
 Copied from CRM [id number]. Topic: Clinical - Medication Refill >> May 24, 2023  8:47 AM Everette C wrote: Most Recent Primary Care Visit:  Provider: Alberteen Huge E  Department: [facility]  Visit Type: OFFICE VISIT  Date: 03/24/2023  Medication: Vitamin D , Ergocalciferol , (DRISDOL ) 1.25 MG (50000 UNIT) CAPS capsule [[id number]]  Has the patient contacted their pharmacy? Yes (Agent: If no, request that the patient contact the pharmacy for the refill. If patient does not wish to contact the pharmacy document the reason why and proceed with request.) (Agent: If yes, when and what did the pharmacy advise?)  Is this the correct pharmacy for this prescription? Yes If no, delete pharmacy and type the correct one.  This is the patient's preferred pharmacy:   [facility] - [place], [place] - [address] [place] [address] Phone: [phone] Fax: [phone]  Has the prescription been filled recently? Yes  Is the patient out of the medication? Yes  Has the patient been seen for an appointment in the last year OR does the patient have an upcoming appointment? Yes  Can we respond through MyChart? No  Agent: Please be advised that Rx refills may take up to 3 business days. We ask that you follow-up with your pharmacy.

## 2023-05-24 NOTE — Telephone Encounter (Signed)
Noted. Informed pt.  

## 2023-05-25 DIAGNOSIS — M791 Myalgia, unspecified site: Secondary | ICD-10-CM | POA: Diagnosis not present

## 2023-05-25 DIAGNOSIS — M542 Cervicalgia: Secondary | ICD-10-CM | POA: Diagnosis not present

## 2023-05-25 DIAGNOSIS — G43719 Chronic migraine without aura, intractable, without status migrainosus: Secondary | ICD-10-CM | POA: Diagnosis not present

## 2023-05-25 DIAGNOSIS — G518 Other disorders of facial nerve: Secondary | ICD-10-CM | POA: Diagnosis not present

## 2023-05-26 ENCOUNTER — Encounter: Payer: Self-pay | Admitting: Internal Medicine

## 2023-05-26 ENCOUNTER — Ambulatory Visit: Payer: Medicare HMO | Admitting: Dermatology

## 2023-06-07 ENCOUNTER — Ambulatory Visit (INDEPENDENT_AMBULATORY_CARE_PROVIDER_SITE_OTHER): Admitting: Dermatology

## 2023-06-07 DIAGNOSIS — H01139 Eczematous dermatitis of unspecified eye, unspecified eyelid: Secondary | ICD-10-CM

## 2023-06-07 DIAGNOSIS — Z7189 Other specified counseling: Secondary | ICD-10-CM

## 2023-06-07 DIAGNOSIS — L658 Other specified nonscarring hair loss: Secondary | ICD-10-CM

## 2023-06-07 DIAGNOSIS — Z79899 Other long term (current) drug therapy: Secondary | ICD-10-CM

## 2023-06-07 DIAGNOSIS — L219 Seborrheic dermatitis, unspecified: Secondary | ICD-10-CM | POA: Diagnosis not present

## 2023-06-07 DIAGNOSIS — H01131 Eczematous dermatitis of right upper eyelid: Secondary | ICD-10-CM

## 2023-06-07 MED ORDER — MINOXIDIL 2.5 MG PO TABS: 2.5000 mg | ORAL_TABLET | Freq: Every day | ORAL | 10 refills | Status: AC

## 2023-06-07 MED ORDER — FLUOCINOLONE ACETONIDE BODY 0.01 % EX OIL: TOPICAL_OIL | CUTANEOUS | 11 refills | Status: AC

## 2023-06-07 MED ORDER — KETOCONAZOLE 2 % EX SHAM: 1.0000 | MEDICATED_SHAMPOO | Freq: Once | CUTANEOUS | 0 refills | Status: DC

## 2023-06-07 MED ORDER — PIMECROLIMUS 1 % EX CREA: TOPICAL_CREAM | CUTANEOUS | 6 refills | Status: AC

## 2023-06-07 NOTE — Progress Notes (Signed)
 Follow-Up Visit   Subjective  Tracey Daniel is a 43 y.o. female who presents for the following:  Here for follow up on traction alopecia at scalp, seen in October, continued on oral minoxidil  2.5 mg tab and clobetasol  solution. States has seen some minimal improvement while using treatments.  Here for follow up on eczema at eyelids and periocular areas. Has been using pimecrolimus  cream to affected areas.   The patient has spots, moles and lesions to be evaluated, some may be new or changing and the patient may have concern these could be cancer.  The following portions of the chart were reviewed this encounter and updated as appropriate: medications, allergies, medical history  Review of Systems:  No other skin or systemic complaints except as noted in HPI or Assessment and Plan.  Objective  Well appearing patient in no apparent distress; mood and affect are within normal limits.    A focused examination was performed of the following areas: Scalp and b/l eyelids   Relevant exam findings are noted in the Assessment and Plan.         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 Condition is improving with treatment but not currently at goal.   Continue Minoxidil  2.5 mg po QD,  pt with hx of Minocycline  use for bumps of the scalp, consider Doxycycline  if bumps occur in the future.     SEBORRHEIC DERMATITIS scalp Exam: Pink patches with greasy scale at scalp with itch  Chronic and persistent condition with duration or expected duration over one year. Condition is bothersome/symptomatic for patient. Currently flared. Seborrheic Dermatitis is a chronic persistent rash characterized by  pinkness and scaling most commonly of the mid face but also can occur on the scalp (dandruff), ears; mid chest, mid back and groin.  It tends to be exacerbated by stress and cooler weather.  People who have neurologic disease may experience new onset or exacerbation of existing seborrheic dermatitis.  The condition is not curable but treatable and can be controlled. Treatment Plan: Start ketoconazole  2 % shampoo - apply 1 time per week, massage into scalp and leave in for 5 - 10 minutes before rinsing out  Start Derma smooth 0.01 % oil - apply topically to scalp nightly 3 times per week m-w-f , leave on scalp cover with shower cap and rinsing out next morning.   Patient instructed to call if scalp oil is over $40 dollars, can send to mail order pharmacy.   Eyelid and periorbital dermatitis - eczema changes of the skin Exam: Hyperpigmentation of the eyelid and perioral area Treatment Plan: Continue Pimecrolimus  cream to aa's QD-BID PRN.   Discussed patch testing to rule out allergies such as nickel, gold etc.   Patient declined testing today  Will plan to do patch testing at next follow up in February.   ECZEMATOUS DERMATITIS OF UPPER AND LOWER EYELIDS OF BOTH EYES   Related Medications pimecrolimus  (ELIDEL ) 1 % cream Apply to eczema of the eyelids BID PRN. TRACTION ALOPECIA   Related Medications minoxidil  (LONITEN ) 2.5 MG tablet Take 1 tablet (2.5 mg total) by mouth daily. SEBORRHEIC DERMATITIS   Related Medications ketoconazole  (NIZORAL ) 2 % shampoo Apply 1 Application topically once for 1 dose. Fluocinolone  Acetonide  Body 0.01 % OIL Apply topically to scalp 3 x week on m-w-f at bedtime cover and wash out next morning.  Return for february follow up on monday or tuesday for alopecia and eyelid dermatitis .  IRandee Busing, CMA, am acting as scribe for Celine Collard, MD.   Documentation: I have reviewed the above documentation for accuracy and completeness, and I  agree with the above.  Celine Collard, MD

## 2023-06-07 NOTE — Patient Instructions (Addendum)
 Seborrheic Dermatitis is a chronic persistent rash characterized by pinkness and scaling most commonly of the mid face but also can occur on the scalp (dandruff), ears; mid chest, mid back and groin.  It tends to be exacerbated by stress and cooler weather.  People who have neurologic disease may experience new onset or exacerbation of existing seborrheic dermatitis.  The condition is not curable but treatable and can be controlled.  Start ketoconazole  shampoo 2% - apply 1 time per week, massage into scalp and leave in for 10 minutes before rinsing out  Start derma smooth oil - apply topically to scalp 3 nights weekly on Monday, Wednesday, Friday, cover with shower cap leave over night and wash out next morning Call if medication is more than 40 dollars can submit to another pharmacy   For Alopecia  Continue minoxidil  2.5 mg tab by mouth daily  Stop clobetasol  solution   For eyelid dermatitis  Continue pimecrolimus  (elidel ) cream - apply topically twice daily to affected areas as needed at eyelids   Due to recent changes in healthcare laws, you may see results of your pathology and/or laboratory studies on MyChart before the doctors have had a chance to review them. We understand that in some cases there may be results that are confusing or concerning to you. Please understand that not all results are received at the same time and often the doctors may need to interpret multiple results in order to provide you with the best plan of care or course of treatment. Therefore, we ask that you please give us  2 business days to thoroughly review all your results before contacting the office for clarification. Should we see a critical lab result, you will be contacted sooner.   If You Need Anything After Your Visit  If you have any questions or concerns for your doctor, please call our main line at [phone] and press option 4 to reach your doctor's medical assistant. If no one answers, please  leave a voicemail as directed and we will return your call as soon as possible. Messages left after 4 pm will be answered the following business day.   You may also send us  a message via MyChart. We typically respond to MyChart messages within 1-2 business days.  For prescription refills, please ask your pharmacy to contact our office. Our fax number is [phone].  If you have an urgent issue when the clinic is closed that cannot wait until the next business day, you can page your doctor at the number below.    Please note that while we do our best to be available for urgent issues outside of office hours, we are not available 24/7.   If you have an urgent issue and are unable to reach us , you may choose to seek medical care at your doctor's office, retail clinic, urgent care center, or emergency room.  If you have a medical emergency, please immediately call 911 or go to the emergency department.  Pager Numbers  - Dr. Bary Likes: [phone]  - Dr. Annette Barters: [phone]  - Dr. Felipe Horton: [phone]   In the event of inclement weather, please call our main line at [phone] for an update on the status of any delays or closures.  Dermatology Medication Tips: Please keep the boxes that topical medications come in in order to help keep track of the instructions about where and how to use these. Pharmacies typically print the medication instructions only on the boxes and not directly on the medication tubes.  If your medication is too expensive, please contact our office at [phone] option 4 or send us  a message through MyChart.   We are unable to tell what your co-pay for medications will be in advance as this is different depending on your insurance coverage. However, we may be able to find a substitute medication at lower cost or fill out paperwork to get insurance to cover a needed medication.   If a prior authorization is required to get your medication covered by your  insurance company, please allow us  1-2 business days to complete this process.  Drug prices often vary depending on where the prescription is filled and some pharmacies may offer cheaper prices.  The website www.goodrx.com contains coupons for medications through different pharmacies. The prices here do not account for what the cost may be with help from insurance (it may be cheaper with your insurance), but the website can give you the price if you did not use any insurance.  - You can print the associated coupon and take it with your prescription to the pharmacy.  - You may also stop by our office during regular business hours and pick up a GoodRx coupon card.  - If you need your prescription sent electronically to a different pharmacy, notify our office through [facility] or by phone at [phone] option 4.     Si Usted Necesita Algo Despus de Su Visita  Tambin puede enviarnos un mensaje a travs de Clinical cytogeneticist. Por lo general respondemos a los mensajes de MyChart en el transcurso de 1 a 2 das hbiles.  Para renovar recetas, por favor pida a su farmacia que se ponga en contacto con nuestra oficina. Franz Jacks de fax es [place] [phone].  Si tiene un asunto urgente cuando la clnica est cerrada y que no puede esperar hasta el siguiente da hbil, puede llamar/localizar a su doctor(a) al nmero que aparece a continuacin.   Por favor, tenga en cuenta que aunque hacemos todo lo posible para estar disponibles para asuntos urgentes fuera del horario de [place], no estamos disponibles las 24 horas del da, los 7 [address]  [address] de la [place].   Si tiene un problema urgente y no puede comunicarse con nosotros, puede optar por buscar atencin mdica  en el consultorio de su doctor(a), en una clnica privada, en un centro de atencin urgente o en una sala de emergencias.  Si tiene Engineer, drilling, por favor llame inmediatamente al 911 o vaya a la sala de emergencias.  Nmeros de  bper  - Dr. Bary Likes: [phone]  - Dra. Annette Barters: [phone]  - Dr. Felipe Horton: [phone]   En caso de inclemencias del tiempo, por favor llame a Lajuan Pila principal al [phone] para una actualizacin sobre el [place] de cualquier retraso o cierre.  Consejos para la medicacin en dermatologa: Por favor, guarde las cajas en las que vienen los medicamentos de uso tpico para ayudarle a seguir las instrucciones sobre dnde y cmo usarlos. Las farmacias generalmente imprimen las instrucciones del medicamento slo en las cajas y no directamente en los tubos del [place].   Si su medicamento es muy caro, por favor, pngase en contacto con Bettyjane Brunet llamando al [phone] y presione la opcin 4 o envenos un mensaje a travs de Clinical cytogeneticist.   No podemos decirle cul ser su copago por los medicamentos por adelantado ya que esto es diferente dependiendo de la cobertura de su seguro. Sin embargo, es posible que podamos encontrar un medicamento sustituto a Audiological scientist  un formulario para que el seguro cubra el medicamento que se considera necesario.   Si se requiere una autorizacin previa para que su compaa de seguros [place] su medicamento, por favor permtanos de 1 a 2 das hbiles para completar este proceso.  Los precios de los medicamentos varan con frecuencia dependiendo del Environmental consultant de dnde se surte la receta y alguna farmacias pueden ofrecer precios ms baratos.  El sitio web www.goodrx.com tiene cupones para medicamentos de Health and safety inspector. Los precios aqu no tienen en cuenta lo que podra costar con la ayuda del seguro (puede ser ms barato con su seguro), pero el sitio web puede darle el precio si no utiliz Tourist information centre manager.  - Puede imprimir el cupn correspondiente y llevarlo con su receta a la farmacia.  - Tambin puede pasar por nuestra oficina durante el horario de atencin regular y Education officer, museum una tarjeta de cupones de GoodRx.  - Si necesita que su receta se  enve electrnicamente a una farmacia diferente, informe a nuestra oficina a travs de MyChart de [place] o por telfono llamando al [phone] y presione la opcin 4.

## 2023-06-08 ENCOUNTER — Encounter: Payer: Self-pay | Admitting: Dermatology

## 2023-07-04 ENCOUNTER — Other Ambulatory Visit: Payer: Self-pay | Admitting: Family Medicine

## 2023-07-05 NOTE — Progress Notes (Signed)
   07/05/2023  Patient ID: Tracey Daniel, female   DOB: 10/16/1980, 43 y.o.   MRN: [id number]  Pharmacy Quality Measure Review  This patient is appearing on a report for being at risk of failing the adherence measure for cholesterol (statin) medications this calendar year.   Medication: rosuvastatin .  filled 06/14/2023 for #18/18DS  Insurance report was not up to date. No action needed at this time.   Rolando Cliche, PharmD, BCGP Clinical Pharmacist  [phone]

## 2023-07-06 ENCOUNTER — Other Ambulatory Visit: Payer: Self-pay | Admitting: Family Medicine

## 2023-07-06 DIAGNOSIS — G518 Other disorders of facial nerve: Secondary | ICD-10-CM | POA: Diagnosis not present

## 2023-07-06 DIAGNOSIS — G43719 Chronic migraine without aura, intractable, without status migrainosus: Secondary | ICD-10-CM | POA: Diagnosis not present

## 2023-07-06 DIAGNOSIS — M542 Cervicalgia: Secondary | ICD-10-CM | POA: Diagnosis not present

## 2023-07-06 DIAGNOSIS — M791 Myalgia, unspecified site: Secondary | ICD-10-CM | POA: Diagnosis not present

## 2023-07-06 DIAGNOSIS — I10 Essential (primary) hypertension: Secondary | ICD-10-CM

## 2023-08-03 ENCOUNTER — Other Ambulatory Visit: Payer: Self-pay | Admitting: Family Medicine

## 2023-08-04 ENCOUNTER — Ambulatory Visit

## 2023-08-05 ENCOUNTER — Other Ambulatory Visit: Payer: Self-pay | Admitting: Family Medicine

## 2023-08-12 ENCOUNTER — Telehealth: Payer: Self-pay | Admitting: Pharmacist

## 2023-08-12 NOTE — Progress Notes (Signed)
 Pharmacy Quality Measure Review  This patient is appearing on a report for being at risk of failing the adherence measure for cholesterol (statin) medications this calendar year.   Medication: rosuvastatin  20 mg daily Last fill date: 7/17 for 30 day supply  Patient is up to date on filling medication, however, medication was discontinued from her prescribed list at GI appointment on 04/13/23.   Attempted to contact patient to discuss, though she has been filling medication. Other medications are being filled for a 90 day supply, while rosuvstatin is only being filled for 30. Will discuss a 90 day supply with prescriber.   Catie IVAR Centers, PharmD, [facility] Clinical Pharmacist [phone]

## 2023-08-18 DIAGNOSIS — F333 Major depressive disorder, recurrent, severe with psychotic symptoms: Secondary | ICD-10-CM | POA: Diagnosis not present

## 2023-08-19 DIAGNOSIS — M542 Cervicalgia: Secondary | ICD-10-CM | POA: Diagnosis not present

## 2023-08-19 DIAGNOSIS — M791 Myalgia, unspecified site: Secondary | ICD-10-CM | POA: Diagnosis not present

## 2023-08-19 DIAGNOSIS — G43719 Chronic migraine without aura, intractable, without status migrainosus: Secondary | ICD-10-CM | POA: Diagnosis not present

## 2023-08-19 DIAGNOSIS — G518 Other disorders of facial nerve: Secondary | ICD-10-CM | POA: Diagnosis not present

## 2023-08-24 ENCOUNTER — Ambulatory Visit
Admission: RE | Admit: 2023-08-24 | Discharge: 2023-08-24 | Disposition: A | Discharge status: Home / Self Care | Source: Ambulatory Visit | Attending: Family Medicine | Admitting: Family Medicine

## 2023-08-24 ENCOUNTER — Telehealth: Payer: Self-pay

## 2023-08-24 DIAGNOSIS — Z1231 Encounter for screening mammogram for malignant neoplasm of breast: Secondary | ICD-10-CM | POA: Diagnosis not present

## 2023-08-24 NOTE — Telephone Encounter (Signed)
 Prolia  VOB initiated via [email or web address]  Next Prolia  inj DUE: 09/24/23

## 2023-08-26 ENCOUNTER — Encounter (HOSPITAL_COMMUNITY): Payer: Self-pay | Admitting: Oncology

## 2023-08-26 ENCOUNTER — Other Ambulatory Visit (HOSPITAL_COMMUNITY): Payer: Self-pay

## 2023-08-26 NOTE — Telephone Encounter (Signed)
 PHARMACY BENEFIT   LAST FILLED 08/16/23, NEXT FILL 01/13/24

## 2023-09-04 ENCOUNTER — Other Ambulatory Visit: Payer: Self-pay | Admitting: Family Medicine

## 2023-09-20 ENCOUNTER — Other Ambulatory Visit: Payer: Self-pay | Admitting: Family Medicine

## 2023-09-22 DIAGNOSIS — M791 Myalgia, unspecified site: Secondary | ICD-10-CM | POA: Diagnosis not present

## 2023-09-22 DIAGNOSIS — G518 Other disorders of facial nerve: Secondary | ICD-10-CM | POA: Diagnosis not present

## 2023-09-22 DIAGNOSIS — G43719 Chronic migraine without aura, intractable, without status migrainosus: Secondary | ICD-10-CM | POA: Diagnosis not present

## 2023-09-22 DIAGNOSIS — M542 Cervicalgia: Secondary | ICD-10-CM | POA: Diagnosis not present

## 2023-09-24 ENCOUNTER — Ambulatory Visit: Admitting: Family Medicine

## 2023-10-04 ENCOUNTER — Other Ambulatory Visit: Payer: Self-pay | Admitting: Obstetrics & Gynecology

## 2023-10-06 ENCOUNTER — Other Ambulatory Visit: Payer: Self-pay | Admitting: Family Medicine

## 2023-10-13 ENCOUNTER — Other Ambulatory Visit: Payer: Self-pay | Admitting: Family Medicine

## 2023-10-19 ENCOUNTER — Telehealth: Payer: Self-pay | Admitting: Family Medicine

## 2023-10-19 NOTE — Telephone Encounter (Unsigned)
 Copied from CRM #[id number]. Topic: Clinical - Prescription Issue >> Oct 19, 2023 12:52 PM Donee H wrote: Reason for CRM: Patient called regarding medication megestrol  (MEGACE ) 40 MG tablet. She states pharmacy has been out of stock with medication for 2 months and she is in need of medication as soon as possible. She states she is barely eating . Would like to know exactly what she can do at this point.  Please follow up with patient

## 2023-10-20 ENCOUNTER — Telehealth: Payer: Self-pay | Admitting: Obstetrics & Gynecology

## 2023-10-20 NOTE — Telephone Encounter (Signed)
 Returned patient's call.  States pharmacy is out of Megestrol .    Spoke with pharmacy. Medication will be ordered today and in tomorrow.   Pt informed. Should be available after 8am.

## 2023-10-20 NOTE — Telephone Encounter (Signed)
 Patient states pharmacy is out of stock of megestrol . Is there anything to substitute it with? Please advise.

## 2023-11-03 DIAGNOSIS — M542 Cervicalgia: Secondary | ICD-10-CM | POA: Diagnosis not present

## 2023-11-03 DIAGNOSIS — G43719 Chronic migraine without aura, intractable, without status migrainosus: Secondary | ICD-10-CM | POA: Diagnosis not present

## 2023-11-03 DIAGNOSIS — M791 Myalgia, unspecified site: Secondary | ICD-10-CM | POA: Diagnosis not present

## 2023-11-03 DIAGNOSIS — G518 Other disorders of facial nerve: Secondary | ICD-10-CM | POA: Diagnosis not present

## 2023-11-05 ENCOUNTER — Other Ambulatory Visit: Payer: Self-pay | Admitting: Family Medicine

## 2023-11-05 DIAGNOSIS — I10 Essential (primary) hypertension: Secondary | ICD-10-CM

## 2023-11-07 ENCOUNTER — Other Ambulatory Visit: Payer: Self-pay | Admitting: Family Medicine

## 2023-12-01 ENCOUNTER — Telehealth: Payer: Self-pay | Admitting: Family Medicine

## 2023-12-01 NOTE — Telephone Encounter (Signed)
 Pt called back to return nurse call from earlier: pt stated she has been taking diclofenac  75 mg for a while for back pain and pt needs a refill.  Pt would like medication refill to go to the following:    This is the patient's preferred pharmacy:  [id number] - [place], [place] - [address] AT [facility] [address] [place] [address] Phone: [phone] Fax: [phone]    If additional information is needed, please reach out to patient.

## 2023-12-01 NOTE — Telephone Encounter (Signed)
 Attempted to contact patient to inquire about medication requested diclofenac . No answer---Left a voicemail for patient to call back

## 2023-12-01 NOTE — Telephone Encounter (Unsigned)
 Copied from CRM [id number]. Topic: Clinical - Medication Refill >> Dec 01, 2023  9:54 AM Nathanel BROCKS wrote: Medication: diclofenac  75 mg  Could not find this in the list   Has the patient contacted their pharmacy? No   This is the patient's preferred pharmacy:  [id number] - [place], [place] - [address] AT [facility] [address] [place] [address] Phone: [phone] Fax: [phone]  Is this the correct pharmacy for this prescription? Yes If no, delete pharmacy and type the correct one.   Has the prescription been filled recently? No  Is the patient out of the medication? Yes  Has the patient been seen for an appointment in the last year OR does the patient have an upcoming appointment? Yes  Can we respond through MyChart? Yes  Agent: Please be advised that Rx refills may take up to 3 business days. We ask that you follow-up with your pharmacy.

## 2023-12-02 ENCOUNTER — Other Ambulatory Visit: Payer: Self-pay

## 2023-12-02 MED ORDER — DICLOFENAC SODIUM 75 MG PO TBEC: DELAYED_RELEASE_TABLET | ORAL | 0 refills | Status: DC

## 2023-12-04 ENCOUNTER — Other Ambulatory Visit: Payer: Self-pay | Admitting: Family Medicine

## 2023-12-09 ENCOUNTER — Other Ambulatory Visit: Payer: Self-pay | Admitting: Dermatology

## 2023-12-09 DIAGNOSIS — L219 Seborrheic dermatitis, unspecified: Secondary | ICD-10-CM

## 2023-12-31 ENCOUNTER — Other Ambulatory Visit: Payer: Self-pay | Admitting: Adult Health

## 2024-01-03 ENCOUNTER — Other Ambulatory Visit: Payer: Self-pay | Admitting: Family Medicine

## 2024-01-05 ENCOUNTER — Other Ambulatory Visit: Payer: Self-pay | Admitting: Family Medicine

## 2024-01-07 ENCOUNTER — Other Ambulatory Visit: Payer: Self-pay | Admitting: Family Medicine

## 2024-02-02 ENCOUNTER — Other Ambulatory Visit: Payer: Self-pay | Admitting: Family Medicine

## 2024-02-04 ENCOUNTER — Other Ambulatory Visit: Payer: Self-pay | Admitting: Family Medicine

## 2024-02-04 ENCOUNTER — Ambulatory Visit: Admitting: Family Medicine

## 2024-02-07 ENCOUNTER — Other Ambulatory Visit: Payer: Self-pay | Admitting: Family Medicine

## 2024-02-07 DIAGNOSIS — I10 Essential (primary) hypertension: Secondary | ICD-10-CM

## 2024-02-12 ENCOUNTER — Other Ambulatory Visit: Payer: Self-pay | Admitting: Family Medicine

## 2024-02-15 ENCOUNTER — Telehealth: Payer: Self-pay

## 2024-02-15 MED ORDER — PROLIA 60 MG/ML ~~LOC~~ SOSY: 60.0000 mg | PREFILLED_SYRINGE | SUBCUTANEOUS | 1 refills | Status: AC

## 2024-02-15 NOTE — Addendum Note (Signed)
 Addended by: ANTONETTA ROLLENE BRAVO on: 02/15/2024 01:12 PM   Modules accepted: Orders

## 2024-02-15 NOTE — Telephone Encounter (Signed)
 Pt needing prolia  refill

## 2024-02-28 ENCOUNTER — Ambulatory Visit: Admitting: Dermatology

## 2024-03-13 ENCOUNTER — Ambulatory Visit: Payer: Medicare HMO

## 2024-03-16 ENCOUNTER — Ambulatory Visit: Admitting: Family Medicine
